# Patient Record
Sex: Female | Born: 1970 | Hispanic: No | Marital: Single | Smoking: Former smoker
Health system: Southern US, Community
[De-identification: ages and names within clinical notes are randomized; demographics above are authoritative.]

## PROBLEM LIST (undated history)

## (undated) ENCOUNTER — Emergency Department (HOSPITAL_BASED_OUTPATIENT_CLINIC_OR_DEPARTMENT_OTHER): Admission: EM | Payer: Medicaid Other

## (undated) DIAGNOSIS — H35039 Hypertensive retinopathy, unspecified eye: Secondary | ICD-10-CM

## (undated) DIAGNOSIS — G932 Benign intracranial hypertension: Secondary | ICD-10-CM

## (undated) DIAGNOSIS — E041 Nontoxic single thyroid nodule: Secondary | ICD-10-CM

## (undated) DIAGNOSIS — I10 Essential (primary) hypertension: Secondary | ICD-10-CM

## (undated) DIAGNOSIS — G4730 Sleep apnea, unspecified: Secondary | ICD-10-CM

## (undated) DIAGNOSIS — E785 Hyperlipidemia, unspecified: Secondary | ICD-10-CM

## (undated) DIAGNOSIS — I2510 Atherosclerotic heart disease of native coronary artery without angina pectoris: Secondary | ICD-10-CM

## (undated) DIAGNOSIS — I639 Cerebral infarction, unspecified: Secondary | ICD-10-CM

## (undated) DIAGNOSIS — F32A Depression, unspecified: Secondary | ICD-10-CM

## (undated) DIAGNOSIS — E11319 Type 2 diabetes mellitus with unspecified diabetic retinopathy without macular edema: Secondary | ICD-10-CM

## (undated) DIAGNOSIS — D649 Anemia, unspecified: Secondary | ICD-10-CM

## (undated) DIAGNOSIS — F419 Anxiety disorder, unspecified: Secondary | ICD-10-CM

## (undated) DIAGNOSIS — R0600 Dyspnea, unspecified: Secondary | ICD-10-CM

## (undated) DIAGNOSIS — J45909 Unspecified asthma, uncomplicated: Secondary | ICD-10-CM

## (undated) DIAGNOSIS — N189 Chronic kidney disease, unspecified: Secondary | ICD-10-CM

## (undated) DIAGNOSIS — R519 Headache, unspecified: Secondary | ICD-10-CM

## (undated) DIAGNOSIS — E559 Vitamin D deficiency, unspecified: Secondary | ICD-10-CM

## (undated) DIAGNOSIS — J189 Pneumonia, unspecified organism: Secondary | ICD-10-CM

## (undated) DIAGNOSIS — M1990 Unspecified osteoarthritis, unspecified site: Secondary | ICD-10-CM

## (undated) DIAGNOSIS — H269 Unspecified cataract: Secondary | ICD-10-CM

## (undated) HISTORY — DX: Type 2 diabetes mellitus with unspecified diabetic retinopathy without macular edema: E11.319

## (undated) HISTORY — DX: Chronic kidney disease, unspecified: N18.9

## (undated) HISTORY — DX: Hypertensive retinopathy, unspecified eye: H35.039

## (undated) HISTORY — DX: Hyperlipidemia, unspecified: E78.5

## (undated) HISTORY — DX: Atherosclerotic heart disease of native coronary artery without angina pectoris: I25.10

## (undated) HISTORY — PX: TUBAL LIGATION: SHX77

## (undated) HISTORY — DX: Cerebral infarction, unspecified: I63.9

## (undated) HISTORY — DX: Unspecified asthma, uncomplicated: J45.909

## (undated) HISTORY — DX: Unspecified cataract: H26.9

## (undated) HISTORY — DX: Vitamin D deficiency, unspecified: E55.9

## (undated) HISTORY — PX: ACHILLES TENDON REPAIR: SUR1153

---

## 2005-06-07 ENCOUNTER — Emergency Department (HOSPITAL_COMMUNITY): Admission: EM | Admit: 2005-06-07 | Discharge: 2005-06-07 | Payer: Self-pay | Admitting: Emergency Medicine

## 2005-08-25 ENCOUNTER — Ambulatory Visit: Payer: Self-pay | Admitting: Family Medicine

## 2005-08-26 ENCOUNTER — Ambulatory Visit: Payer: Self-pay | Admitting: Family Medicine

## 2005-10-13 ENCOUNTER — Encounter: Admission: RE | Admit: 2005-10-13 | Discharge: 2005-10-13 | Payer: Self-pay | Admitting: Neurology

## 2005-10-19 ENCOUNTER — Ambulatory Visit (HOSPITAL_COMMUNITY): Admission: RE | Admit: 2005-10-19 | Discharge: 2005-10-19 | Payer: Self-pay | Admitting: Neurology

## 2005-12-16 ENCOUNTER — Encounter: Admission: RE | Admit: 2005-12-16 | Discharge: 2005-12-16 | Payer: Self-pay | Admitting: Ophthalmology

## 2006-04-10 ENCOUNTER — Emergency Department (HOSPITAL_COMMUNITY): Admission: EM | Admit: 2006-04-10 | Discharge: 2006-04-10 | Payer: Self-pay | Admitting: Emergency Medicine

## 2006-04-14 ENCOUNTER — Encounter: Admission: RE | Admit: 2006-04-14 | Discharge: 2006-04-14 | Payer: Self-pay | Admitting: Family Medicine

## 2006-05-10 ENCOUNTER — Ambulatory Visit (HOSPITAL_COMMUNITY): Admission: RE | Admit: 2006-05-10 | Discharge: 2006-05-10 | Payer: Self-pay | Admitting: Orthopedic Surgery

## 2006-05-25 ENCOUNTER — Ambulatory Visit: Payer: Self-pay | Admitting: Family Medicine

## 2006-08-13 ENCOUNTER — Inpatient Hospital Stay (HOSPITAL_COMMUNITY): Admission: AD | Admit: 2006-08-13 | Discharge: 2006-08-13 | Payer: Self-pay | Admitting: Gynecology

## 2006-09-29 ENCOUNTER — Inpatient Hospital Stay (HOSPITAL_COMMUNITY): Admission: AD | Admit: 2006-09-29 | Discharge: 2006-10-01 | Payer: Self-pay | Admitting: Obstetrics & Gynecology

## 2006-12-14 ENCOUNTER — Inpatient Hospital Stay (HOSPITAL_COMMUNITY): Admission: AD | Admit: 2006-12-14 | Discharge: 2006-12-29 | Payer: Self-pay | Admitting: Obstetrics & Gynecology

## 2006-12-26 ENCOUNTER — Encounter: Payer: Self-pay | Admitting: Obstetrics & Gynecology

## 2007-12-21 ENCOUNTER — Inpatient Hospital Stay (HOSPITAL_COMMUNITY): Admission: AD | Admit: 2007-12-21 | Discharge: 2007-12-23 | Payer: Self-pay | Admitting: Obstetrics & Gynecology

## 2007-12-25 ENCOUNTER — Inpatient Hospital Stay (HOSPITAL_COMMUNITY): Admission: AD | Admit: 2007-12-25 | Discharge: 2007-12-29 | Payer: Self-pay | Admitting: Obstetrics

## 2008-01-11 ENCOUNTER — Inpatient Hospital Stay (HOSPITAL_COMMUNITY): Admission: AD | Admit: 2008-01-11 | Discharge: 2008-01-14 | Payer: Self-pay | Admitting: Obstetrics

## 2008-01-28 ENCOUNTER — Inpatient Hospital Stay (HOSPITAL_COMMUNITY): Admission: AD | Admit: 2008-01-28 | Discharge: 2008-02-01 | Payer: Self-pay | Admitting: Obstetrics & Gynecology

## 2008-01-29 ENCOUNTER — Encounter: Payer: Self-pay | Admitting: Obstetrics & Gynecology

## 2009-10-18 ENCOUNTER — Emergency Department (HOSPITAL_COMMUNITY): Admission: EM | Admit: 2009-10-18 | Discharge: 2009-10-18 | Payer: Self-pay | Admitting: Emergency Medicine

## 2010-01-03 ENCOUNTER — Emergency Department (HOSPITAL_COMMUNITY)
Admission: EM | Admit: 2010-01-03 | Discharge: 2010-01-03 | Payer: Self-pay | Source: Home / Self Care | Admitting: Emergency Medicine

## 2010-08-29 ENCOUNTER — Encounter: Payer: Self-pay | Admitting: Obstetrics & Gynecology

## 2010-09-06 ENCOUNTER — Ambulatory Visit (HOSPITAL_BASED_OUTPATIENT_CLINIC_OR_DEPARTMENT_OTHER)
Admission: RE | Admit: 2010-09-06 | Discharge: 2010-09-06 | Payer: Self-pay | Source: Home / Self Care | Attending: Internal Medicine | Admitting: Internal Medicine

## 2010-09-11 DIAGNOSIS — G4733 Obstructive sleep apnea (adult) (pediatric): Secondary | ICD-10-CM

## 2010-10-30 ENCOUNTER — Emergency Department (HOSPITAL_COMMUNITY)
Admission: EM | Admit: 2010-10-30 | Discharge: 2010-10-30 | Disposition: A | Discharge status: Home / Self Care | Payer: Medicaid Other | Attending: Emergency Medicine | Admitting: Emergency Medicine

## 2010-10-30 DIAGNOSIS — E669 Obesity, unspecified: Secondary | ICD-10-CM | POA: Insufficient documentation

## 2010-10-30 DIAGNOSIS — M79609 Pain in unspecified limb: Secondary | ICD-10-CM | POA: Insufficient documentation

## 2010-10-30 DIAGNOSIS — E119 Type 2 diabetes mellitus without complications: Secondary | ICD-10-CM | POA: Insufficient documentation

## 2010-10-30 DIAGNOSIS — M7989 Other specified soft tissue disorders: Secondary | ICD-10-CM | POA: Insufficient documentation

## 2010-10-30 DIAGNOSIS — L02619 Cutaneous abscess of unspecified foot: Secondary | ICD-10-CM | POA: Insufficient documentation

## 2010-10-30 DIAGNOSIS — J45909 Unspecified asthma, uncomplicated: Secondary | ICD-10-CM | POA: Insufficient documentation

## 2010-10-30 DIAGNOSIS — L03039 Cellulitis of unspecified toe: Secondary | ICD-10-CM | POA: Insufficient documentation

## 2010-10-30 DIAGNOSIS — I10 Essential (primary) hypertension: Secondary | ICD-10-CM | POA: Insufficient documentation

## 2010-10-30 LAB — POCT I-STAT, CHEM 8
BUN: 4 mg/dL — ABNORMAL LOW (ref 6–23)
Calcium, Ion: 1.16 mmol/L (ref 1.12–1.32)
Chloride: 98 mEq/L (ref 96–112)
Creatinine, Ser: 0.6 mg/dL (ref 0.4–1.2)
Glucose, Bld: 286 mg/dL — ABNORMAL HIGH (ref 70–99)
HCT: 43 % (ref 36.0–46.0)
Hemoglobin: 14.6 g/dL (ref 12.0–15.0)
Potassium: 3.4 mEq/L — ABNORMAL LOW (ref 3.5–5.1)
Sodium: 137 mEq/L (ref 135–145)
TCO2: 28 mmol/L (ref 0–100)

## 2010-12-21 NOTE — H&P (Signed)
[name], [name]               ACCOUNT NO.:  [id number]   MEDICAL RECORD NO.:  [id number]          PATIENT TYPE:  MAT   LOCATION:  [facility]                          FACILITY:  [facility]   PHYSICIAN:  Agnes Lawrence, M.D.DATE OF BIRTH:  1971-04-12   DATE OF ADMISSION:  01/28/2008  DATE OF DISCHARGE:                              HISTORY & PHYSICAL   CHIEF COMPLAINT:  The patient is a 40 year old para 3 with an estimated  date of confinement of August 9 with an intrauterine pregnancy at 33+  weeks with gestational diabetes, chronic hypertension, now with  superimposed severe preeclampsia for a repeat cesarean delivery,  possible bilateral tubal ligation.   HISTORY OF PRESENT ILLNESS:  The patient presented to the office today  for routine prenatal care.  However, her blood pressures were in the 170-  180s over 100-126s.  As per conversation with Dr. Burnett Harry, her  antihypertensive regimen was recently titrated last week.  She reports  being compliant with her antihypertensive medications.  She reports  lower extremity swelling.  She denies any neurological symptoms.  Her  CBCs are essentially in range.   MEDICATIONS:  Please see the medication reconciliation form.   ALLERGIES:  No known drug allergies.   OBSTETRICAL RISK FACTORS:  Advanced maternal age, previous cesarean  delivery, previous preterm delivery, history of a previous fetal demise,  chronic hypertension, asthma, diabetes mellitus pseudotumor cerebri, and  obstructive sleep apnea.   PAST OBSTETRICAL HISTORY:  In 1994, there was a second trimester fetal  demise.  In January 1996 she was delivered of a live born female 8  pounds 12 ounces full term via vaginal delivery.  In December 1997,  there was a 27-week fetal demise.  In January 2007, there was a  spontaneous.  Abortion in April 2002 there was a preterm delivery at 83  weeks.  She was delivered of a 6 pounds 12 ounces female via vaginal  delivery.  In May of 2008,  she delivered a live born female 9 pounds 4  ounces via cesarean delivery.  That pregnancy was complicated by  preeclampsia.   PRENATAL LABS:  Blood type is AB positive, antibody screen negative.  Urine culture and sensitivity in February 2009 no growth.  GC probe  negative.  Hepatitis B surface antigen negative, hematocrit 39.6,  hemoglobin 13, HIV nonreactive, hemoglobin A1c in February 2009 8.1,  platelets 190,000.  In March 2009 a 24-hour urine demonstrated 158 mg  per day.  RPR nonreactive, rubella immune.  Sickle cell negative.  Varicella immune.  Chlamydia probe negative.  An ultrasound on May 28 at  30 weeks 2 days.  The amniotic fluid was mildly elevated at 22.7.  The  estimated fetal weight percentile was at 86th percentile.   PAST GYN HISTORY:  Noncontributory.   PAST MEDICAL HISTORY:  Please see the above.   PAST SURGICAL HISTORY:  Please see the above.  There is a history of two  right foot surgeries.   SOCIAL HISTORY:  She is unemployed.  She is single, living with a  significant other.  She does not give  any significant history of alcohol  usage.  She previously smoked less than one-half pack per day and  stopped recently.  She denies illicit drug use.   FAMILY HISTORY:  Asthma, hypertension, heart disease, myocardial  infarction, hypercholesterolemia, adult-onset diabetes, depression and  CVA.   PHYSICAL EXAM:  VITAL SIGNS:  Blood pressure 170-180s over 100-120s.  Fetal nonstress test, reactive.  Tocodynamometer, irregular uterine  contractions.   REVIEW OF SYSTEMS:  Neurological: She denies headache, visual  disturbances.  GI: She denies any epigastric pain, nausea or vomiting.  Cardiac: Please see the above.   ASSESSMENT:  Intrauterine pregnancy at 33+ weeks with chronic  hypertension, likely superimposed severe preeclampsia, also in the  setting of pregestational diabetes, multiple comorbidities.   PLAN:  Admission, a repeat cesarean delivery and possible  BTL are  planned.  Will check PIH panel, urinalysis.  Will also start magnesium  sulfate for seizure prophylaxis.      Agnes Lawrence, M.D.  Electronically Signed     LAJ/MEDQ  D:  01/28/2008  T:  01/28/2008  Job:  [id number]

## 2010-12-21 NOTE — H&P (Signed)
NAMELAURENA, [name]               ACCOUNT NO.:  [id number]   MEDICAL RECORD NO.:  [id number]          PATIENT TYPE:  INP   LOCATION:  9120                          FACILITY:  [facility]   PHYSICIAN:  Agnes Lawrence, M.D.DATE OF BIRTH:  Jul 16, 1971   DATE OF ADMISSION:  01/11/2008  DATE OF DISCHARGE:                              HISTORY & PHYSICAL   CHIEF COMPLAINT:  The patient is a 40 year old para 3 with an estimated  date of confinement of March 16, 2008 with an intrauterine pregnancy at  35 plus weeks with gestational diabetes and a history of chronic  hypertension, now with elevated blood pressures.   HISTORY OF PRESENT ILLNESS:  The patient presented to the office today  for routine prenatal care.  However, her blood pressures were in the  170s-180s/100s-110.  She reports being compliant with her  antihypertensive medications.  She denies any neurological symptoms.  She has also not been checking her CBGs at home for the last several  days.   MEDICATIONS:  Please see the medication reconciliation form.   ALLERGIES:  No known drug allergies.   OBSTETRICAL RISK FACTORS:  Advanced maternal age, previous cesarean  delivery, previous preterm delivery, history of a previous fetal demise,  chronic hypertension, asthma, diabetes mellitus, pseudotumor cerebri,  and obstructive sleep apnea.   PAST OBSTETRICAL HISTORY:  In 1994, there was a second trimester fetal  demise.  In January 1996, she was delivered of a live born female 8  pounds 12 ounces full term vaginal delivery.  In December 1997, there  was a 27-week fetal demise.  In January 2007, there was a spontaneous  abortion.  In April 2002, there was a preterm delivery at 37 weeks.  She  delivered of a 6 pounds 12 ounces female via vaginal delivery.  In May  2008, she delivered of a live born female 9 pounds 4 ounces via cesarean  delivery.   PRENATAL LABS:  Blood type is AB positive, antibody screen negative,  urine culture  and sensitivity in February 2009 no growth, GC probe  negative, hepatitis B surface antigen negative, hematocrit 39.6,  hemoglobin 13, HIV nonreactive, hemoglobin A1c in February 2009 was 8.1,  platelets 190,000 in March 2009, a 24-hour urine demonstrated 158  mg/day, RPR nonreactive, rubella immune, sickle cell negative, varicella  immune, chlamydia probe negative.   PAST GYN HISTORY:  Noncontributory.   PAST MEDICAL HISTORY:  Please see the above.   PAST SURGICAL HISTORY:  Please see the above.  There is a history of two  right foot surgeries.   SOCIAL HISTORY:  She is unemployed.  She is single, living with a  significant other.  She does not give any significant history of alcohol  usage.  She previously smoked less than one-half pack per day and  stopped recently.  She denies illicit drug use.   FAMILY HISTORY:  Asthma, hypertension, heart disease, myocardial  infarction, hypercholesterolemia, adult-onset diabetes, depression, and  CVA.   PHYSICAL EXAMINATION:  VITAL SIGNS:  Blood pressures 170s-180s/100-110s,  CBG 78 random.  Nonstress test reassuring 10/10 beat accelerations.  TOCODYNAMOMETER:  Irregular uterine contractions.  STERILE VAGINAL EXAM:  The cervix is closed posterior long.  There is  old heme noted on the exam glove.   REVIEW OF SYSTEMS:  GU:  She reports mucous like discharge with blood.  She denies recent coitus.  NEUROLOGICAL:  Please see the above.  She  denies headache, visual disturbances.  GI:  She denies any epigastric  pain, nausea, or vomiting.   ASSESSMENT:  Intrauterine pregnancy at 31 plus weeks with pregestational  diabetes, chronic hypertension rule out superimposed pregnancy-induced  hypertension.   PLAN:  Admission, bedrest, PIH labs, 24-hour urine for protein and  creatinine, consider possible steroids for fetal lung maturity, and  daily weights.      Agnes Lawrence, M.D.  Electronically Signed     LAJ/MEDQ  D:   01/11/2008  T:  01/12/2008  Job:  [id number]

## 2010-12-21 NOTE — H&P (Signed)
[name], [name]               ACCOUNT NO.:  [id number]   MEDICAL RECORD NO.:  [id number]          PATIENT TYPE:  INP   LOCATION:  [id number]                          FACILITY:  [facility]   PHYSICIAN:  Agnes Lawrence, M.D.DATE OF BIRTH:  June 25, 1971   DATE OF ADMISSION:  12/25/2007  DATE OF DISCHARGE:                              HISTORY & PHYSICAL   CHIEF COMPLAINT:  The patient is a 40 year old para 3 with an estimated  date of confinement of March 16, 2008, with an intrauterine pregnancy at  28 plus weeks with gestational diabetes and hyperglycemia.   HISTORY OF PRESENT ILLNESS:  The patient was recently admitted several  days ago with similar issues.  She per her report had CBGs ranging in  200-400 range at home.   MEDICATIONS:  Please see the medication reconciliation form.   ALLERGIES:  No known drug allergies.   OBSTETRIC RISK FACTORS:  1. Advanced maternal age.  2. Previous cesarean delivery.  3. Previous preterm delivery.  4. History of a previous fetal demise.  5. Chronic hypertension.  6. Asthma.  7. Diabetes mellitus.  8. Pseudotumor cerebri.  9. Obstructive sleep apnea.   PAST OBSTETRICAL HISTORY:  1. In 1994, there was second trimester fetal demise.  2. In August 26, 1994, she was delivered of a live born female, 8      pounds 12 ounces full term vaginal delivery.  3. In December 1997, there was a 27-week fetal demise.  4. In January 2001, there was a spontaneous abortion.  5. In April 2002, there was a preterm delivery at 44 weeks.  She was      delivered of a 6 pounds 12 ounces female vaginal delivery.  6. In May 2008, she was delivered of a live born female 9 pounds 4      ounces via cesarean delivery.   PRENATAL LABORATORY:  Blood type is AB positive, antibody screen  negative.  Urine culture and sensitivity in February 2009, no growth.  GC probe negative.  Hepatitis B surface antigen negative.  Hematocrit  39.6, hemoglobin 13, HIV nonreactive,  hemoglobin A1c in on October 04, 2007, was 8.1, and platelet count 190,000.  In March 2009,  a 24-hour  urine was remarkable for 158 mg per day.  RPR nonreactive and rubella  immune.  Sickle cell negative.  Varicella immune.   PAST GYN HISTORY:  Noncontributory.   PAST MEDICAL HISTORY:  Please see the above.   PAST SURGICAL HISTORY:  Please see the above.  There is a history of two  right foot surgeries.   SOCIAL HISTORY:  She is unemployed.  She is single, living with the  significant other.  She does not give any significant history of alcohol  usage.  Previously smoked less than on-half pack per day, stopped  recently.  Denies illicit drug use.   FAMILY HISTORY:  Asthma, hypertension, heart disease, myocardial  infarction, hypercholesterolemia, adult-onset diabetes, depression, and  CVA.   On physical exam,  vital signs, CBGs 180s-250s, blood pressures 140s-  150s over 90s-100.  Fetal heart tracing reassuring.  Tocodynamometer  irregular uterine contractions.  Sterile vaginal exam per the RN.   ASSESSMENT:  Intrauterine pregnancy at 28 plus weeks with pregestational  diabetes mellitus, poorly controlled.   PLAN:  Admission, diabetic management consultation to further titrate  her insulin regimen.      Agnes Lawrence, M.D.  Electronically Signed     LAJ/MEDQ  D:  12/26/2007  T:  12/26/2007  Job:  [id number]

## 2010-12-21 NOTE — Discharge Summary (Signed)
[name], [name]               ACCOUNT NO.:  [id number]   MEDICAL RECORD NO.:  [id number]          PATIENT TYPE:  INP   LOCATION:  9304                          FACILITY:  [facility]   PHYSICIAN:  Charles A. Jodi Mourning, M.D.DATE OF BIRTH:  October 25, 1970   DATE OF ADMISSION:  01/28/2008  DATE OF DISCHARGE:  02/01/2008                               DISCHARGE SUMMARY   ADMITTING DIAGNOSES:  1. 34 weeks' gestation.  2. Chronic hypertension with superimposed severe preeclampsia.  3. Pregestational diabetes.  4. Desired permanent sterilization.   DISCHARGE DIAGNOSES:  1. 34 weeks' gestation.  2. Chronic hypertension with superimposed severe preeclampsia.  3. Pregestational diabetes.  4. Desired permanent sterilization.  5. Status post repeat low transverse cesarean section and bilateral      partial salpingectomy on January 29, 2008.  Viable female infant was      delivered at 11:27.  Apgars of 7 at 1 minute, 8 at 5 minutes,      weight of 2965 g.  Mother discharged home on postop day #3 in good      condition.  The infant was taken to the neonatal intensive care      unit and remains there for prematurity.   REASON FOR ADMISSION:  The patient is 36-year para 3 with estimated date  of confinement of March 16, 2008, presents at approximately 40 weeks'  gestation with chronic hypertension and now with superimposed severe  preeclampsia for repeat cesarean delivery.  The patient also desired  permanent sterilization.  She presented to the office today for routine  prenatal care, but her blood pressures were 171-80 over 100-120.  As per  conversation with Dr. Renella Cunas at [facility], the  patient's antihypertensive regimen was recently titrated last week.  She  reports being compliant with her antihypertensive regimen.  She reports  lower extremity swelling.  Denied any neurological symptoms.   PAST OBSTETRICAL HISTORY:  In 1994, there was a second trimester fetal  demise; and 1996,  she delivered a live born female 8 pounds 12 ounces,  full-term by vaginal delivery; in 1997, there was a 60 week fetal  demise; in 2007, there was spontaneous abortion; in 2002, there was  delivery at [redacted] weeks gestation, 6 pounds and 12 ounces via vaginal  delivery; in 2008, she delivered a live born female via cesarean  delivery, weighing 9 pounds 4 ounces, that pregnancy was complicated by  preeclampsia, obstetrical risk factors, advanced maternal age, previous  cesarean section, previous fetal demise, chronic hypertension, diabetes  mellitus, pseudotumor cerebri, asthma, and obstructive sleep apnea.   PAST MEDICAL HISTORY:  Surgery per history of present illness.  Illnesses include diabetes, asthma, pseudotumor cerebri, and  hypertension.   MEDICATIONS:  Please see medication reconsolidation form.   ALLERGIES:  No known drug allergies.   SOCIAL HISTORY:  She is single, unemployed, living with her significant  other.  Negative history of alcohol.  Positive history of tobacco.  Negative history of recreational drug use.   REVIEW OF SYSTEMS:  Neurologic:  She denied headache or visual  disturbances.  GI:  She  denied any epigastric pain, nausea, or vomiting.   PHYSICAL EXAMINATION:  GENERAL:  Obese, black female, in no acute  distress.  VITAL SIGNS:  Blood pressure 170-180 over 100-120, afebrile.  External fetal monitoring revealed reactive tracing.  Tocodynamometer revealed irregular uterine contractions.  LUNGS:  Clear to auscultation bilaterally.  HEART:  Regular rate and rhythm.  ABDOMEN:  Obese, gravid, and nontender.  CERVIX:  Long, closed, and vertex at high station.   LABORATORY DATA:  Admitting labs, hemoglobin 13.9, hematocrit 40.5,  white blood cell count 9200, and platelets 225,000.  Comprehensive  metabolic panel was significant for sodium 134, potassium 3.2, glucose  of 63, BUN 6, creatinine 0. 5.  AST was 30 and ALT was 29.  Uric acid  was 6.9.  Urinalysis  revealed large hemoglobin, specific gravity 1.020,  greater than 300 protein, trace leukocyte esterase, and many epithelial  cells.  RPR is nonreactive.   IMPRESSION:  1. A 34 weeks' gestation.  2. Chronic hypertension.  3. Diabetes  4. Asthma.  5. Superimposed severe preeclampsia.  6. Desired repeat cesarean section and bilateral tubal ligation.  7. Gestational age of approximately 79 weeks'.   PLAN:  Admit, we will proceed with repeat low transverse cesarean  section and bilateral tubal ligation.   HOSPITAL COURSE:  The patient underwent repeat low transverse cesarean  section and bilateral partial salpingectomy on January 29, 2008.  There  were no intraoperative complications.  Postoperative course was  uncomplicated.  The patient was discharged home on postop day #3 in good  condition.   Discharge labs, hemoglobin 10, hematocrit 29, white blood cell count  8200, and platelets 179,000.   DISCHARGE DISPOSITION:  Medications, Tylox and ibuprofen was prescribed  for pain.  Continue prenatal vitamins.  Continue medications taken at  home prior to admission to the hospital.  Routine written instructions  were given for discharge after cesarean section.  The patient is to call  the office for a followup appointment in 2 weeks.      Charles A. Jodi Mourning, M.D.  Electronically Signed     CAH/MEDQ  D:  02/01/2008  T:  02/02/2008  Job:  [id number]

## 2010-12-21 NOTE — Discharge Summary (Signed)
[name], [name]               ACCOUNT NO.:  [id number]   MEDICAL RECORD NO.:  [id number]          PATIENT TYPE:  INP   LOCATION:  9151                          FACILITY:  [facility]   PHYSICIAN:  Agnes Lawrence, M.D.DATE OF BIRTH:  [birth year]   DATE OF ADMISSION:  12/21/2007  DATE OF DISCHARGE:  12/23/2007                               DISCHARGE SUMMARY   CHIEF COMPLAINT:  The patient is a 40 year old para 3 with an estimated  date of confinement of August 9 with an intrauterine pregnancy at 28  plus weeks with gestational diabetes and acute hyperglycemia.  Please  see the dictated history and physical for further details.   HOSPITAL COURSE:  The patient was admitted.  The diabetes coordinator  was consulted.  Her insulin regimen was titrated to optimize her  glycemic control.  Her CBGs were in the euglycemic range prior to  discharge to home.   DISCHARGE DIAGNOSIS:  Intrauterine pregnancy at 28 plus weeks with  gestational diabetes and acute hyperglycemia.   PROCEDURES:  None.   CONDITION:  Stable.   DIET:  ADA diet.   MEDICATIONS:  Continue Lantus and Humalog insulin.   DISPOSITION:  The patient is to follow up in the office in several days.      Agnes Lawrence, M.D.  Electronically Signed     LAJ/MEDQ  D:  01/24/2008  T:  01/25/2008  Job:  [id number]

## 2010-12-21 NOTE — H&P (Signed)
NAMEKASHEA, BARTONE               ACCOUNT NO.:  [id number]   MEDICAL RECORD NO.:  [id number]          PATIENT TYPE:  INP   LOCATION:  9151                          FACILITY:  [facility]   PHYSICIAN:  Agnes Lawrence, M.D.DATE OF BIRTH:  [birth year]   DATE OF ADMISSION:  12/21/2007  DATE OF DISCHARGE:                              HISTORY & PHYSICAL   CHIEF COMPLAINT:  The patient is a 40 year old para 3 with an estimated  date of confinement of August 9 with an intrauterine pregnancy at 28.3  weeks with gestational diabetes and acute hyperglycemia.   HISTORY OF PRESENT ILLNESS:  The patient reported today for a routine  prenatal visit.  Upon review of her diabetic record her CBGs for the  past 24 hours have all been elevated, in the 200-400 range.  A CBG here  in the office was 295.  On urine dip there was 4+ glycosuria, no  ketones.  The fetal heart tones by Doppler were normal, in the 140  range.  She denies any symptoms consistent with an intercurrent illness.   MEDICATIONS:  Please see the medication reconciliation form.   ALLERGIES:  No known drug allergies.   OB RISK FACTORS:  Advanced maternal age, previous cesarean delivery,  previous preterm delivery, history of previous fetal demise, chronic  hypertension, asthma, diabetes mellitus, obstructive sleep apnea and  pseudotumor cerebri.   PAST OBSTETRICAL HISTORY:  In 1994 there was a second-trimester fetal  demise.  In January 1996 she was delivered of a live-born female, 8  pounds 12 ounces, full term vaginal delivery.  In December 1997 there  was a 27-week fetal demise.  In January 2001 there was a spontaneous  abortion.  In April 2002 there was a preterm delivery at 32 weeks, 6  pounds 12 ounces, female, vaginal delivery.  In May 2008 she was  delivered of a live-born female, 9 pounds 4 ounces, via cesarean  delivery.   PRENATAL LABORATORIES:  Blood type is AB positive, antibody screen  negative.  Urine culture February  2009:  No growth.  GC probe negative.  Hepatitis B surface antigen negative.  Hematocrit 39.6, hemoglobin 13.  HIV nonreactive.  Hemoglobin A1c on October 04, 2007, was 8.1.  Platelet count 190,000.  In March 2009, a 24-hour urine 158 mg per day.  RPR nonreactive, rubella immune.  Sickle cell negative.  Varicella  immune.   PAST GYN HISTORY:  Noncontributory.   PAST MEDICAL HISTORY:  Please see the above.   PAST SURGICAL HISTORY:  Please see the above, right foot surgery x2.   SOCIAL HISTORY:  She is unemployed.  She is single, living with a  significant other.  Does not give any significant history of alcohol  usage.  Previously smoked less than one-half pack per day, stopped  recently.  Denies illicit drug use.   FAMILY HISTORY:  Asthma, hypertension, heart disease, myocardial  infarction, hypercholesterolemia, adult-onset diabetes, depression, CVA.   PHYSICAL EXAMINATION:  VITAL SIGNS:  Stable, afebrile.  ABDOMEN:  Gravid.  PELVIC EXAM:  The cervix is posterior and closed.   ASSESSMENT:  Intrauterine  pregnancy at 28-plus weeks with history of  adult-onset diabetes mellitus, acute hyperglycemia; also with a history  of chronic hypertension, pseudotumor cerebri and asthma.   PLAN:  Admission.  Continue current insulin regimen for now.  Will add a  sliding scale.  Diabetic management consultation.  IV hydration.  Will  check a CBC, CMET and urinanalysis and culture and sensitivity.  NSTs  twice daily.      Agnes Lawrence, M.D.  Electronically Signed     LAJ/MEDQ  D:  12/21/2007  T:  12/21/2007  Job:  [id number]

## 2010-12-21 NOTE — Discharge Summary (Signed)
[name], PATIENT               ACCOUNT NO.:  [id number]   MEDICAL RECORD NO.:  [id number]          PATIENT TYPE:  INP   LOCATION:  9143                          FACILITY:  [facility]   PHYSICIAN:  Charles A. Jodi Mourning, M.D.DATE OF BIRTH:  Aug 26, 1970   DATE OF ADMISSION:  12/14/2006  DATE OF DISCHARGE:  12/29/2006                               DISCHARGE SUMMARY   ADMITTING DIAGNOSES:  1. Thirty-four weeks gestation.  2. Preterm uterine contractions.  3. Poor obstetrical history.  4. Gestational diabetes on insulin.  5. Chronic hypertension.  6. Obstructive sleep apnea.  7. Pseudotumor cerebri.  8. Rule out superimposed preeclampsia.  9. Breech presentation.   DISCHARGE DIAGNOSES:  1. Thirty-four weeks gestation.  2. Preterm uterine contractions.  3. Poor obstetrical history.  4. Gestational diabetes on insulin.  5. Chronic hypertension.  6. Obstructive sleep apnea.  7. Pseudotumor cerebri.  8. Rule out superimposed preeclampsia.  9. Breech presentation.  10.Status post amniocentesis for fetal lung maturity with positive      fetal lung maturity studies.  11.Status post primary low transverse cesarean section on Dec 26, 2006, with delivery of a viable female infant at 82.  Apgars of 8      at one minute, 9 at five minutes.  Weight of 4200 g, length of 53.5      cm.  Mother and infant discharged home in good condition.   REASON FOR ADMISSION:  A 40 year old black female G6, P2 with estimated  date of confinement of January 21, 2007, presented with complaint of  uterine contractions.   PAST OBSTETRICAL HISTORY:  Significant for previous fetal demise.   PAST MEDICAL HISTORY:  Significant for chronic hypertension, asthma.  The patient also had gestational diabetes with the previous pregnancies  on insulin.  She is obese with obstructive sleep apnea.   PAST SURGICAL HISTORY:  Foot surgery.   MEDICATIONS:  Insulin for gestational diabetes, verapamil for  hypertension, Diamox  Sequels, and Topamax.   ALLERGIES:  No known drug allergies.   SOCIAL HISTORY:  Unemployed, single.  Negative tobacco, alcohol or  recreational drug use.   FAMILY HISTORY:  Positive for asthma, hypertension, heart disease,  diabetes, depression, cardiovascular disease.   REVIEW OF SYSTEMS:  Significant for no headaches or blurred vision or  any other visual disturbances.   PHYSICAL EXAMINATION:  GENERAL:  Obese black female in no acute  distress.  VITAL SIGNS:  Afebrile, blood pressure 161/83.  LUNGS:  Clear to auscultation bilaterally.  HEART:  Regular rate and rhythm.  ABDOMEN:  Obese, gravid, nontender.  PELVIC:  Cervix 1 cm dilated, 50% effaced, breech presentation at a -3  station.   IMPRESSION:  Intrauterine pregnancy at [redacted] weeks gestation with premature  uterine contractions.  High risk pregnancy.   PLAN:  Admit for bedrest and diabetic management.  Serial examinations  and labs for rule out preeclampsia.  Bedrest.   ADMITTING LABORATORY VALUES:  Hemoglobin 12.8; hematocrit 37; white  blood cell count 5800; platelets 181,000.  Comprehensive metabolic panel  was within normal limits.  Urinalysis was within  normal limits.   HOSPITAL COURSE:  The patient was admitted and placed on bedrest.  Blood  pressures were stable on antihypertensive medications.  She continued to  have stable blood pressure management and there was no significant  evidence of preeclampsia.  Glucose control was not well controlled  initially, but over the course of her hospital stay glucose control was  maintained.  She was started on magnesium sulfate for preterm uterine  contractions and the magnesium sulfate was discontinued after about a  week of hospitalization and she had no further significant uterine  activity.  At [redacted] weeks gestation a decision was made to proceed with  amniocentesis for assessment of fetal lung maturity for delivery because  of diabetes and chronic hypertension, with the  recommendation by  maternal-fetal consultation that delivery should be attempted as soon as  fetal lung maturity was documented starting at [redacted] weeks gestation with  amniocentesis.  Amniocentesis was done at [redacted] weeks gestation with  positive fetal lung maturity studies and a decision was made to proceed  with cesarean section delivery because of the breech presentation.  Cesarean section was performed as a primary low transverse cesarean  section on Dec 26, 2006.  There were no intraoperative complications.  Postoperative course was uncomplicated.  The patient was discharged home  on postoperative day #3 in good condition.   DISCHARGE LABORATORY VALUES:  Hemoglobin 11.8; hematocrit 34.4; white  blood cell count 8500; platelets 175,000.   DISCHARGE DISPOSITION:  Continue medications that were taken prior to  admission to the hospital.  Insulin was discontinued.  Tylox and  ibuprofen were prescribed for pain.  Routine written instructions were  given for discharge after cesarean section.  The patient is to call the  office for a followup appointment in 2 weeks.     Charles A. Jodi Mourning, M.D.  Electronically Signed    CAH/MEDQ  D:  12/29/2006  T:  12/29/2006  Job:  [id number]

## 2010-12-21 NOTE — Op Note (Signed)
[name], [name]               ACCOUNT NO.:  [id number]   MEDICAL RECORD NO.:  [id number]          PATIENT TYPE:  INP   LOCATION:  [id number]                          FACILITY:  [facility]   PHYSICIAN:  Agnes Lawrence, M.D.DATE OF BIRTH:  March 05, 1971   DATE OF PROCEDURE:  01/29/2008  DATE OF DISCHARGE:                               OPERATIVE REPORT   PREOPERATIVE DIAGNOSES:  1. Intrauterine pregnancy at 34 weeks.  2. Severe preeclampsia.  3. Gestational diabetes.  4. Chronic hypertension.  5. Desires sterilization procedure.   POSTOPERATIVE DIAGNOSES:  1. Intrauterine pregnancy at 34 weeks.  2. Severe preeclampsia.  3. Gestational diabetes.  4. Chronic hypertension.  5. Desires sterilization procedure.   PROCEDURES:  1. Repeat cesarean delivery.  2. Bilateral tubal ligation.   SURGEON:  Agnes Lawrence, MD   ANESTHESIA:  Spinal.   PATHOLOGY:  Placenta.   ESTIMATED BLOOD LOSS:  600 mL.   COMPLICATIONS:  None.   PROCEDURE:  The patient was taken to the operating room with an IV  running.  After a spinal anesthetic was administered, she was placed in  the dorsal supine position and prepped and draped in the usual sterile  fashion.  .  After time-out had been completed, the previous scar was  then incised with a scalpel and carried down to the fascia.  The fascia  was nicked in the midline.  The fascial incision was then extended  bilaterally.  The superior aspect of the fascial incision was tented up  and the underlying rectus muscles dissected off.  The inferior aspect of  the fascial incision was manipulated in a similar fashion.  The rectus  muscles were separated in the midline.  The parietal peritoneum was  tented up and entered sharply.  This incision was then extended  superiorly and inferiorly with good visualization of bladder.  The  Alexis retractor was then placed into the incision.  The vesicouterine  peritoneum was tented up and entered sharply.  This  incision was then  extended bilaterally and the bladder flap was created using both sharp  and blunt dissection.  The lower uterine segment was then incised in a  transverse fashion with a scalpel.  This incision was then extended  bluntly.  With vacuum assistance, the infant's head was delivered  atraumatically.  The oropharynx was suctioned with a bulb suction.  The  cord was clamped and cut.  The infant was handed off to the awaiting  neonatologist.  The placenta was then removed.  The intrauterine cavity  was evacuated of any remaining amniotic fluid, clots, and debris with  moist laparotomy sponge.  The uterine incision was then reapproximated  in a running interlocking fashion using suture of 0 Monocryl.  Adequate  hemostasis was noted.  The left fallopian tube was then identified.  The  mid isthmic portion of tube was then grasped with a Babcock clamp.  A 2-  cm segment of tube was then doubly ligated with 0 plain and excised.  Adequate hemostasis was noted.  The right fallopian tube was manipulated  in a similar fashion.  The retractor was then removed from the incision.  The parietal peritoneum was then reapproximated in a running fashion  using 2-0 Vicryl.  The fascia was closed in a running fashion using 0  PDS.  The skin was closed in a subcuticular fashion using 3-0 Monocryl.  At the closure of the procedure the instrument and pack counts were said  to be correct x2.  Two grams of cephazolin had been given at the  beginning of the procedure.  The patient was taken to the PACU awake and  in stable condition.      Agnes Lawrence, M.D.  Electronically Signed     LAJ/MEDQ  D:  01/29/2008  T:  01/30/2008  Job:  [id number]

## 2010-12-21 NOTE — H&P (Signed)
[name], [name]               ACCOUNT NO.:  [id number]   MEDICAL RECORD NO.:  [id number]          PATIENT TYPE:  INP   LOCATION:  [id number]                          FACILITY:  [facility]   PHYSICIAN:  Agnes Lawrence, M.D.DATE OF BIRTH:  [birth year]   DATE OF ADMISSION:  12/14/2006  DATE OF DISCHARGE:                              HISTORY & PHYSICAL   CHIEF COMPLAINT:  The patient is a 40 year old gravida 57, para 2 with an  estimated date of confinement of June 15th with an intrauterine  pregnancy at 34+ weeks, complaining of uterine contractions.   HISTORY OF PRESENT ILLNESS:  Please see the above.   ALLERGIES:  No known drug allergies.   MEDICATIONS:  Please the medication reconciliation form.   OBSTETRICAL RISK FACTORS:  She has a history of fetal demise, chronic  hypertension, asthma, gestational diabetes, on insulin, obesity,  obstructive sleep apnea, prenatal labs, anticardiolipin IgG normal,  anticardiolipin antibody IgM negative, Chlamydia probe negative.  Urine  culture and sensitivity, no uropathogens.  Pap smear negative.  GC probe  negative.  Hematocrit 39.1, hemoglobin 13.2, HIV nonreactive, lupus  anticoagulant not detected.  Platelets 248,000.  A 24-hour urine for  protein on November 21 3 mg per day.  Blood type AB+.  Antibody screen  negative.  RPR nonreactive.  Rubella immune.  Sickle cell negative.  Varicella immune.   An ultrasound on February 25th at 24 weeks, there was no previa.  The  amniotic fluid was normal.  The estimated fetal weight percentile was  66%.   PAST GYN HISTORY:  Please see the above.  She has a history of  pseudotumor cerebri.   PAST SURGICAL HISTORY:  Foot surgery.   SOCIAL HISTORY:  She is unemployed, single.  Does not give any  significant history of alcohol usage.  She has no significant smoking  history.  Denies illicit drug use.   FAMILY HISTORY:  Asthma, hypertension, heart disease, myocardial  infarction,  hypercholesterolemia, adult-onset diabetes, depression, and  CVA.   REVIEW OF SYSTEMS:  She denies headache, blurred vision, or any other  visual disturbances.   PAST OBSTETRICAL HISTORY:  In April, 2002, she delivered a 6 pound, 12  ounce female, questionable gestational age, vaginal delivery.  In  January, 2001, she had a spontaneous abortion.  In December, 1997, she  had a fetal demise at 83 weeks.  In January, 1996, she delivered a  female, 8 pounds, 12 ounces, full term vaginal delivery.  In January,  1994, she had a second trimester fetal demise.   PHYSICAL EXAMINATION:  VITAL SIGNS:  Blood pressure 161/83, urine dipped  negative for protein, weight 273 pounds.  Nonstress test reassuring with  regular contractions every 5-10 minutes.  PELVIC:  Sterile vaginal exam of the cervix is fingertip to 1, 50%  effaced.   ASSESSMENT:  Intrauterine pregnancy at 34+ weeks with premature  contractions, poor obstetrical history, gestational diabetes on insulin,  chronic hypertension, obstructive sleep apnea, pseudotumor cerebri, rule  out superimposed pregnancy-induced hypertension.   PLAN:  Admission, bedrest, glycemic surveillance, ADA diet, a 24-hour  urine for protein  and creatinine, serial PIH labs, a complete OB  ultrasound for growth, bedrest.  Will obtain a CPAP machine, continue  antihypertensive medications.      Agnes Lawrence, M.D.  Electronically Signed     LAJ/MEDQ  D:  12/14/2006  T:  12/14/2006  Job:  [id number]

## 2010-12-21 NOTE — Op Note (Signed)
[name], [name]               ACCOUNT NO.:  [id number]   MEDICAL RECORD NO.:  [id number]          PATIENT TYPE:  OUT   LOCATION:  ULT                           FACILITY:  [facility]   PHYSICIAN:  Agnes Lawrence, M.D.DATE OF BIRTH:  24-Mar-1971   DATE OF PROCEDURE:  12/26/2006  DATE OF DISCHARGE:                               OPERATIVE REPORT   PREOPERATIVE DIAGNOSES:  1. Intrauterine pregnancy at 36 weeks.  2. Breech presentation.  3. Gestational diabetes on insulin.  4. Chronic hypertension with superimposed mild preeclampsia.  5. Rule out macrosomia.   POSTOPERATIVE DIAGNOSES:  1. Intrauterine pregnancy at 36 weeks.  2. Breech presentation.  3. Gestational diabetes on insulin.  4. Chronic hypertension with superimposed mild preeclampsia.  5. Rule out macrosomia.   PROCEDURE:  Primary low uterine flap elliptical cesarean delivery via  Pfannenstiel skin incision.   SURGEONS:  1. Agnes Lawrence, M.D.  2. Charles A. Jodi Mourning, M.D.   ANESTHESIA:  Spinal.   ESTIMATED BLOOD LOSS:  600 mL.   COMPLICATIONS:  None.   PROCEDURE:  The patient was taken to the operating room with an IV  running and a Foley catheter in place.  She was placed in the dorsal  supine position with a leftward tilt and prepped and draped in the usual  sterile fashion.  A Pfannenstiel skin incision was then made with the  scalpel and carried down to the underlying fascia with the Bovie.  The  fascia was nicked in the midline.  The fascial incision was then  extended bilaterally with curved Mayo scissors.  The superior aspect of  the fascial incision was then tented up and the underlying rectus  muscles dissected off.  The inferior aspect of the fascial incision was  manipulated in a similar fashion.  The rectus muscles were separated in  the midline.  The parietal peritoneum was then entered.  The peritoneal  incision was then extended superiorly and inferiorly with good  visualization of the  bladder.  The Alexis retractor was then placed into  the incision.  The vesicouterine peritoneum was tented up and entered  sharply with Metzenbaum scissors.  This incision was then extended  bilaterally and the bladder flap created bluntly.  The lower uterine  segment was then incised in a transverse fashion with a scalpel.  This  incision was then extended bluntly.  A complete breech extraction was  then performed.  The infant's head was delivered atraumatically.  The  oropharynx was suctioned with bulb suction.  The cord was clamped and  cut.  The infant was handed off to the awaiting neonatologist.  Placenta  was then removed.  The intrauterine cavity was evacuated of any  remaining amniotic fluid, clots and debris with moist laparotomy sponge.  The uterine incision was then reapproximated in a running interlocking  fashion using 0 Monocryl.  A second imbricating layer of the same was  then placed.  Adequate hemostasis was noted.  The paracolic gutters were  then irrigated.  The Alexis retractor was then removed.  The parietal  peritoneum was reapproximated in a running  fashion using 2-0 Vicryl.  The fascia was reapproximated in a running fashion using 0 Monocryl.  The skin was closed with staples.  At the close of the procedure, the  instrument and pack counts were said to be correct x2.  Two grams of  cephazolin had been given at cord clamp.  The patient was taken to the  PACU awake and in stable condition.      Agnes Lawrence, M.D.  Electronically Signed     LAJ/MEDQ  D:  12/27/2006  T:  12/27/2006  Job:  [id number]

## 2010-12-24 NOTE — Op Note (Signed)
[name], [name]               ACCOUNT NO.:  [id number]   MEDICAL RECORD NO.:  [id number]          PATIENT TYPE:  AMB   LOCATION:  SDS                          FACILITY:  [facility]   PHYSICIAN:  Newt Minion, MD     DATE OF BIRTH:  [birth year]   DATE OF PROCEDURE:  05/10/2006  DATE OF DISCHARGE:                                 OPERATIVE REPORT   PREOPERATIVE DIAGNOSIS:  Chronic failed right Achilles tendon  reconstruction.   POSTOPERATIVE DIAGNOSIS:  Chronic failed right Achilles tendon  reconstruction.   PROCEDURE:  1. Takedown of nonunion Achilles reconstruction.  2. Gastrocnemius recession.  3. Achilles tendon reconstruction.  4. Application of Pegasus collagen graft.   SURGEON:  Newt Minion, MD   ANESTHESIA:  General.   ESTIMATED BLOOD LOSS:  Minimal.   ANTIBIOTICS:  One gram of Kefzol.   DRAINS:  None.   COMPLICATIONS:  None.   TOURNIQUET TIME:  None.   DISPOSITION:  To PACU in stable condition.   INDICATION FOR PROCEDURE:  The patient is a 40 year old woman who several  years ago while in [place] sustained a right Achilles tendon rupture.  She  underwent open reconstruction of the Achilles tendon.  The patient has had  failure of her reconstruction with stretching out of the graft.  MRI scan  shows no continuity of intact Achilles tissue and due to failure of  conservative care, after failure of Achilles tendon reconstruction, the  patient presents at this time for takedown of the nonunion, revision and  reconstruction.  Risks and benefits were discussed including infection,  neurovascular injury, recurrent rupture, nonhealing of the incision, and  need for additional surgery.  The patient states she understands and wishes  to proceed at this time.   DESCRIPTION OF PROCEDURE:  The patient was brought to OR room #1 and  underwent a general anesthetic.  After adequate level of anesthesia was  obtained, the patient was placed prone on the operating table  and her right  lower extremity was prepped using DuraPrep and draped into a sterile field.  An incision was made centered over a previous posteromedial incision.  This  was carried down through the peritenon.  This was scarred down to the  fibrous tissue from the Achilles repair.  The proximal and distal aspects  were freed up and then freed up through the area of scar.  Approximately 2  inches of fibrous nonunion tissue were excised from the Achilles.  Using #2-  0 FiberWire and a Krawkow stitch, 4 strands were placed through the proximal  and distal poles with 4 core sutures from each end of the Achilles  reconstruction.  a gastrocnemius recession was used to allow the ends to  approximate.  The sutures were tied as core sutures individually.  There was  still a slight several-millimeter gap between the tissue ends.  The  reconstruction was then wrapped with the Pegasus graft.  This was an equine  collagen paracardial graft.  This was secured using 2-0 PDS.  This was  wrapped around the repair and secured proximally and  distally and then a  running suture vertically.  The wound was irrigated with normal saline.  The  peritenon was closed using 3-0 Monocryl.  The skin was closed using 2-0  nylon with a modified Allgower-Donati suture.  The wound was covered with  Adaptic, orthopedic sponges, sterile Webril and a posterior splint was  applied with Webril and then a Coban dressing.  The foot was kept in  plantarflexion with gentle resting plantarflexion.  The ankle was placed  through a range of motion; the ankle was able to obtain neutral  dorsiflexion.  The patient was extubated and taken to PACU in stable  condition.  Plan for discharge to home.  She was injected proximal to the  incision with a total 30 mL of 0.25% Marcaine plain.   Follow up in office in 2 weeks, rest, ice, elevation and nonweightbearing on  the right.      Newt Minion, MD  Electronically Signed      MVD/MEDQ  D:  05/10/2006  T:  05/11/2006  Job:  [id number]

## 2010-12-24 NOTE — H&P (Signed)
[name], [name]               ACCOUNT NO.:  [id number]   MEDICAL RECORD NO.:  [id number]          PATIENT TYPE:  INP   LOCATION:                                FACILITY:  [facility]   PHYSICIAN:  Agnes Lawrence, M.D.DATE OF BIRTH:  31-Jul-1971   DATE OF ADMISSION:  09/29/2006  DATE OF DISCHARGE:  10/01/2006                              HISTORY & PHYSICAL   CHIEF COMPLAINT:  The patient is a 40 year old, gravida 73, para 2, with  an estimated date of confinement of January 21, 2007, with an intrauterine  pregnancy at 23+ weeks, with likely gestational diabetes.   HISTORY OF PRESENT ILLNESS:  Please see the above.  The patient  presented to the office today for a routine visit and was found to have  2 plus glycosuria on the urine DIP.  A fasting CBG was 215.  The patient  has a history of gestational diabetes.   ALLERGIES:  NO KNOWN DRUG ALLERGIES.   MEDICATIONS:  Please see the medication reconciliation form.   OBSTETRICAL RISK FACTORS:  1. She has a history of fetal demise.  2. Chronic hypertension.  3. Asthma.  4. History of gestational diabetes.  5. Obesity.   PRENATAL LABORATORY:  Anticardiolipin IgG:  Normal.  Anticardiolipin  antibody IgM:  Negative.  Chlamydia probe:  Negative.  Urine culture and  sensitivity:  No uropathogens.  Pap smear:  Negative.  GC probe:  Negative.  Hematocrit 39.1, hemoglobin 13.2.  HIV:  Nonreactive.  Lupus  anticoagulant:  Not detected.  Platelets 248,000.  A 24-hour urine for  protein on June 28 2006, 3 mg/day.  Blood type:  AB positive.  Antibody screen:  Negative.  RPR:  Nonreactive.  Rubella:  Immune.  Sickle Cell:  Negative.  Varicella:  Immune.   PAST GYNECOLOGIC HISTORY:  She denied.   PAST MEDICAL HISTORY:  1. Hypertension.  2. Asthma.  3. Pseudo tumor cerebri.   PAST SURGICAL HISTORY:  Foot surgery.   SOCIAL HISTORY:  She is unemployed, single, does not give any  significant issue of alcohol usage, has no significant  smoking history,  denies illicit drug use.   FAMILY HISTORY:  Asthma, hypertension, heart disease, myocardial  infarction, hypercholesterolemia, adult-onset diabetes, depression, CVA.   PAST OBSTETRICAL HISTORY:  1. In April 2002, she was delivered of a 6 pounds 12 ounces female,      questionable gestational age, a vaginal delivery.  2. In January 2001, she had a spontaneous abortion.  3. In December 1997, she had a fetal demise at 64 weeks.  4. In January 1996, she was delivered of a female 8 pounds 12 ounces,      full term, vaginal delivery.  5. In January 1994, she had a second trimester fetal demise.   PHYSICAL EXAMINATION:  VITAL SIGNS:  Weight 278 pounds, blood pressure  166/85, urine dip 2 plus protein, 1 plus red blood cells, and 1 plus  ketones. Fetal heart rate 150s.  GENERAL:  Obese, no apparent distress.  ABDOMEN:  Gravid, nontender.  PELVIC:  Vaginal exam deferred.   ASSESSMENT:  Multipara  at 23 plus weeks, poor obstetrical history, now  with likely gestational diabetes.   PLAN:  1. Admission.  2. Diabetic diet and teaching.  3. Will start on an oral hypoglycemic agent.  However, the patient was      counseled regarding the possible need for insulin therapy for      glycemic control.      Agnes Lawrence, M.D.  Electronically Signed     LAJ/MEDQ  D:  09/29/2006  T:  09/29/2006  Job:  [id number]

## 2010-12-24 NOTE — Discharge Summary (Signed)
[name], [name]               ACCOUNT NO.:  [id number]   MEDICAL RECORD NO.:  [id number]          PATIENT TYPE:  INP   LOCATION:  9120                          FACILITY:  [facility]   PHYSICIAN:  Agnes Lawrence, M.D.DATE OF BIRTH:  04-14-1971   DATE OF ADMISSION:  01/11/2008  DATE OF DISCHARGE:  01/14/2008                               DISCHARGE SUMMARY   CHIEF COMPLAINT:  The patient is a 40 year old para 3 with an estimated  date of confinement of March 16, 2008, with an intrauterine pregnancy at  31+ weeks with gestational diabetes and history of chronic hypertension,  now with elevated blood pressures.   HOSPITAL COURSE:  Please see the dictated history and physical for  further details.  The patient was admitted.  Her blood pressures  remained stable on bedrest.  A 24-hour urine was collected as well as  PIH labs, and her CBGs remained in range.  The 24-hour urine met  criteria for mild preeclampsia.  She remained stable, and was  subsequently discharged to home on January 14, 2008.   DISCHARGE DIAGNOSES:  1. Intrauterine pregnancy at 31 plus weeks.  2. Chronic hypertension with mild superimposed preeclampsia.  3. Gestational diabetes.   CONDITION:  Stable.   DIET:  ADA diet, low-sodium.   MEDICATIONS:  Resume medications prior to admission.   DISPOSITION:  The patient was to follow up in the office in several  days.      Agnes Lawrence, M.D.  Electronically Signed     LAJ/MEDQ  D:  03/07/2008  T:  03/08/2008  Job:  [id number]

## 2010-12-24 NOTE — Discharge Summary (Signed)
[name], [name]               ACCOUNT NO.:  [id number]   MEDICAL RECORD NO.:  [id number]          PATIENT TYPE:  INP   LOCATION:  9155                          FACILITY:  [facility]   PHYSICIAN:  Charles A. Jodi Mourning, M.D.DATE OF BIRTH:  1970/11/05   DATE OF ADMISSION:  12/25/2007  DATE OF DISCHARGE:  12/29/2007                               DISCHARGE SUMMARY   ADMITTING DIAGNOSES:  A 28 plus weeks' gestation and pregestational  diabetes mellitus, poorly controlled.   DISCHARGE DIAGNOSES:  A 28 plus weeks' gestation; pregestational  diabetes mellitus, poorly controlled; status post diabetic management  consultation for insulin treatment.   REASON FOR ADMISSION:  A 40 year old para 3 with estimated date of  confinement of March 16, 2008, intrauterine pregnancy 28 plus weeks'  gestation with gestational diabetes and hyperglycemia.  The patient was  recently admitted several days ago with similar issues.  She per her  report had the capillary blood sugars ranging from 200-400 range at  home.   PAST MEDICAL HISTORY:  Surgeries:  Foot surgery and cesarean section.  Illnesses:  Chronic hypertension, pseudotumor cerebri, and asthma.   MEDICATIONS:  Please see medication reconciliation form.   ALLERGIES:  No known drug allergies.   OBSTETRIC RISK FACTORS:  Advanced maternal age, previous cesarean  delivery, previous preterm delivery, history of previous fetal demise,  chronic hypertension, asthma, diabetes mellitus, pseudotumor cerebri,  and obstructive sleep apnea.   SOCIAL HISTORY:  Single, living with significant other.  Does not give  any significant history of alcohol usage.  Smokes less than one-half  pack a day after recently.  Denies illicit drug use.   FAMILY HISTORY:  Asthma, hypertension, heart disease,  hypercholesterolemia, adult-onset diabetes, depression, and CVA.   PHYSICAL EXAMINATION:  GENERAL:  Well-nourished and well-developed  female in no acute distress.  She is  afebrile.  VITAL SIGNS:  Stable.  LUNGS:  Clear to auscultation bilaterally.  HEART:  Regular rate and rhythm.  ABDOMEN:  Gravid, nontender.  CERVICAL EXAM:  Omitted.   ASSESSMENT:  Intrauterine pregnancy at 56 plus weeks' gestation with  pregestational diabetes mellitus, poorly controlled.   PLAN:  Admit diabetic consultation to further titrate insulin regimen.   ADMITTING LABS:  Hemoglobin 14.1, hematocrit 41, white blood cell count  9300, platelets 242,000.  Comprehensive metabolic panel was remarkable  for glucose of 148.   HOSPITAL COURSE:  The patient was admitted and started on insulin  regimen with consultation by the diabetic teaching nurse N. Carlis Abbott, RN.  The patient's capillary blood sugars were slowly brought under good  control and by hospital day #3, the patient indicated that she could  manage her blood sugars quite well and had good capillary blood sugar  readings and was therefore discharged home on hospital day #4 to  continue her insulin regimen that was started in the hospital.   DISCHARGE DISPOSITION:  Medications:  Lantus insulin 120 units at  bedtime, NovoLog 28 units with meals.  Continue prenatal vitamins and  modified carbohydrate diet at home.  The patient is to call office for  followup appointment in 1 week.  Charles A. Jodi Mourning, M.D.  Electronically Signed     CAH/MEDQ  D:  02/29/2008  T:  03/01/2008  Job:  [id number]

## 2011-02-07 ENCOUNTER — Emergency Department (HOSPITAL_COMMUNITY)
Admission: EM | Admit: 2011-02-07 | Discharge: 2011-02-07 | Disposition: A | Discharge status: Home / Self Care | Payer: Medicaid Other | Attending: Emergency Medicine | Admitting: Emergency Medicine

## 2011-02-07 ENCOUNTER — Emergency Department (HOSPITAL_COMMUNITY): Payer: Medicaid Other

## 2011-02-07 DIAGNOSIS — M25569 Pain in unspecified knee: Secondary | ICD-10-CM | POA: Insufficient documentation

## 2011-02-07 DIAGNOSIS — E119 Type 2 diabetes mellitus without complications: Secondary | ICD-10-CM | POA: Insufficient documentation

## 2011-02-07 DIAGNOSIS — M1710 Unilateral primary osteoarthritis, unspecified knee: Secondary | ICD-10-CM | POA: Insufficient documentation

## 2011-02-07 DIAGNOSIS — Z794 Long term (current) use of insulin: Secondary | ICD-10-CM | POA: Insufficient documentation

## 2011-02-07 DIAGNOSIS — IMO0002 Reserved for concepts with insufficient information to code with codable children: Secondary | ICD-10-CM | POA: Insufficient documentation

## 2011-02-07 DIAGNOSIS — I10 Essential (primary) hypertension: Secondary | ICD-10-CM | POA: Insufficient documentation

## 2011-03-28 ENCOUNTER — Emergency Department (HOSPITAL_COMMUNITY)
Admission: EM | Admit: 2011-03-28 | Discharge: 2011-03-28 | Disposition: A | Discharge status: Home / Self Care | Payer: Medicaid Other | Attending: Emergency Medicine | Admitting: Emergency Medicine

## 2011-03-28 ENCOUNTER — Emergency Department (HOSPITAL_COMMUNITY): Payer: Medicaid Other

## 2011-03-28 DIAGNOSIS — Z79899 Other long term (current) drug therapy: Secondary | ICD-10-CM | POA: Insufficient documentation

## 2011-03-28 DIAGNOSIS — M25569 Pain in unspecified knee: Secondary | ICD-10-CM | POA: Insufficient documentation

## 2011-03-28 DIAGNOSIS — I10 Essential (primary) hypertension: Secondary | ICD-10-CM | POA: Insufficient documentation

## 2011-03-28 DIAGNOSIS — E119 Type 2 diabetes mellitus without complications: Secondary | ICD-10-CM | POA: Insufficient documentation

## 2011-03-28 DIAGNOSIS — E7800 Pure hypercholesterolemia, unspecified: Secondary | ICD-10-CM | POA: Insufficient documentation

## 2011-04-19 ENCOUNTER — Other Ambulatory Visit (HOSPITAL_COMMUNITY): Payer: Self-pay | Admitting: Orthopedic Surgery

## 2011-04-19 DIAGNOSIS — M25561 Pain in right knee: Secondary | ICD-10-CM

## 2011-04-26 ENCOUNTER — Inpatient Hospital Stay (HOSPITAL_COMMUNITY): Admission: RE | Admit: 2011-04-26 | Payer: Medicaid Other | Source: Ambulatory Visit

## 2011-05-02 ENCOUNTER — Inpatient Hospital Stay (HOSPITAL_COMMUNITY): Admission: RE | Admit: 2011-05-02 | Payer: Medicaid Other | Source: Ambulatory Visit

## 2011-05-04 ENCOUNTER — Other Ambulatory Visit (HOSPITAL_COMMUNITY): Payer: Medicaid Other

## 2011-05-04 LAB — HEMOGLOBIN A1C
Hgb A1c MFr Bld: 9.5 — ABNORMAL HIGH
Mean Plasma Glucose: 261

## 2011-05-04 LAB — URINE CULTURE
Colony Count: 25000
Special Requests: NEGATIVE

## 2011-05-04 LAB — COMPREHENSIVE METABOLIC PANEL
ALT: 23
AST: 19
Albumin: 2.6 — ABNORMAL LOW
Alkaline Phosphatase: 63
BUN: 6
CO2: 19
Calcium: 10.1
Chloride: 107
Creatinine, Ser: 0.54
GFR calc Af Amer: >60
GFR calc non Af Amer: >60
Glucose, Bld: 148 — ABNORMAL HIGH
Potassium: 4
Sodium: 135
Total Bilirubin: 0.7
Total Protein: 6.3

## 2011-05-04 LAB — CREATININE CLEARANCE, URINE, 24 HOUR
Collection Interval-CRCL: 24
Creatinine Clearance: 296 — ABNORMAL HIGH
Creatinine, 24H Ur: 2301 — ABNORMAL HIGH
Creatinine, Urine: 39
Creatinine: 0.54
Urine Total Volume-CRCL: 5900

## 2011-05-04 LAB — CBC
HCT: 36.4
HCT: 41.3
Hemoglobin: 12.6
Hemoglobin: 14.1
MCHC: 34.3
MCHC: 34.6
MCV: 81.3
MCV: 82.2
Platelets: 201
Platelets: 242
RBC: 4.48
RBC: 5.02
RDW: 15.2
RDW: 15.2
WBC: 7.4
WBC: 9.3

## 2011-05-04 LAB — PROTEIN, URINE, 24 HOUR
Collection Interval-UPROT: 24
Protein, 24H Urine: 295 — ABNORMAL HIGH
Protein, Urine: 5
Urine Total Volume-UPROT: 5900

## 2011-05-04 LAB — URINALYSIS, ROUTINE W REFLEX MICROSCOPIC
Bilirubin Urine: NEGATIVE
Glucose, UA: >1000 — AB
Ketones, ur: 15 — AB
Nitrite: NEGATIVE
Protein, ur: NEGATIVE
Specific Gravity, Urine: 1.015
Urobilinogen, UA: 0.2
pH: 6.5

## 2011-05-04 LAB — URINE MICROSCOPIC-ADD ON

## 2011-05-04 LAB — URIC ACID: Uric Acid, Serum: 4.9

## 2011-05-04 LAB — KETONES, URINE: Ketones, ur: NEGATIVE

## 2011-05-04 LAB — LACTATE DEHYDROGENASE: LDH: 130

## 2011-05-04 LAB — RPR: RPR Ser Ql: NONREACTIVE

## 2011-05-05 LAB — DIFFERENTIAL
Basophils Absolute: 0
Basophils Relative: 0
Eosinophils Absolute: 0.1
Eosinophils Relative: 1
Lymphocytes Relative: 20
Lymphs Abs: 2
Monocytes Absolute: 0.5
Monocytes Relative: 5
Neutro Abs: 7.5
Neutrophils Relative %: 74

## 2011-05-05 LAB — COMPREHENSIVE METABOLIC PANEL
ALT: 20
ALT: 20
ALT: 29
AST: 21
AST: 27
AST: 30
Albumin: 2.4 — ABNORMAL LOW
Albumin: 1.7 — ABNORMAL LOW
Albumin: 2.2 — ABNORMAL LOW
Alkaline Phosphatase: 57
Alkaline Phosphatase: 55
Alkaline Phosphatase: 78
BUN: 8
BUN: 5 — ABNORMAL LOW
BUN: 6
CO2: 25
CO2: 24
CO2: 26
Calcium: 9.5
Calcium: 7.2 — ABNORMAL LOW
Calcium: 8.5
Chloride: 99
Chloride: 103
Chloride: 104
Creatinine, Ser: 0.57
Creatinine, Ser: 0.45
Creatinine, Ser: 0.59
GFR calc Af Amer: >60
GFR calc Af Amer: >60
GFR calc Af Amer: >60
GFR calc non Af Amer: >60
GFR calc non Af Amer: >60
GFR calc non Af Amer: >60
Glucose, Bld: 217 — ABNORMAL HIGH
Glucose, Bld: 63 — ABNORMAL LOW
Glucose, Bld: 69 — ABNORMAL LOW
Potassium: 3.7
Potassium: 3.2 — ABNORMAL LOW
Potassium: 3.7
Sodium: 133 — ABNORMAL LOW
Sodium: 134 — ABNORMAL LOW
Sodium: 134 — ABNORMAL LOW
Total Bilirubin: 0.1 — ABNORMAL LOW
Total Bilirubin: 0.5
Total Bilirubin: 0.5
Total Protein: 6.5
Total Protein: 4.8 — ABNORMAL LOW
Total Protein: 6.3

## 2011-05-05 LAB — URINE MICROSCOPIC-ADD ON

## 2011-05-05 LAB — URINALYSIS, ROUTINE W REFLEX MICROSCOPIC
Bilirubin Urine: NEGATIVE
Bilirubin Urine: NEGATIVE
Glucose, UA: 500 — AB
Glucose, UA: NEGATIVE
Ketones, ur: NEGATIVE
Ketones, ur: NEGATIVE
Leukocytes, UA: NEGATIVE
Nitrite: NEGATIVE
Nitrite: NEGATIVE
Protein, ur: 100 — AB
Protein, ur: >300 — AB
Specific Gravity, Urine: >1.03 — ABNORMAL HIGH
Specific Gravity, Urine: 1.02
Urobilinogen, UA: 0.2
Urobilinogen, UA: 0.2
pH: 5.5
pH: 7

## 2011-05-05 LAB — CBC
HCT: 38.2
HCT: 29.8 — ABNORMAL LOW
HCT: 40.5
Hemoglobin: 13.4
Hemoglobin: 10.3 — ABNORMAL LOW
Hemoglobin: 13.9
MCHC: 35
MCHC: 34.3
MCHC: 34.5
MCV: 81.3
MCV: 82.4
MCV: 83.8
Platelets: 223
Platelets: 179
Platelets: 225
RBC: 4.7
RBC: 3.56 — ABNORMAL LOW
RBC: 4.92
RDW: 15.2
RDW: 15.4
RDW: 15.6 — ABNORMAL HIGH
WBC: 10.1
WBC: 8.2
WBC: 9.2

## 2011-05-05 LAB — LACTATE DEHYDROGENASE
LDH: 118
LDH: 171
LDH: 202

## 2011-05-05 LAB — RPR: RPR Ser Ql: NONREACTIVE

## 2011-05-05 LAB — CREATININE, URINE, 24 HOUR
Collection Interval-UCRE24: 24
Creatinine, 24H Ur: 2456 — ABNORMAL HIGH
Creatinine, Urine: 61.4
Urine Total Volume-UCRE24: 4000

## 2011-05-05 LAB — URIC ACID
Uric Acid, Serum: 5.9
Uric Acid, Serum: 6.9
Uric Acid, Serum: 7.4 — ABNORMAL HIGH

## 2011-05-05 LAB — PROTEIN, URINE, 24 HOUR
Collection Interval-UPROT: 24
Protein, 24H Urine: 640 — ABNORMAL HIGH
Protein, Urine: 16
Urine Total Volume-UPROT: 4000

## 2011-08-25 ENCOUNTER — Encounter (HOSPITAL_COMMUNITY): Payer: Self-pay

## 2011-08-25 ENCOUNTER — Emergency Department (HOSPITAL_COMMUNITY)
Admission: EM | Admit: 2011-08-25 | Discharge: 2011-08-25 | Disposition: A | Discharge status: Home / Self Care | Payer: Medicaid Other | Attending: Emergency Medicine | Admitting: Emergency Medicine

## 2011-08-25 DIAGNOSIS — L03116 Cellulitis of left lower limb: Secondary | ICD-10-CM

## 2011-08-25 DIAGNOSIS — L02619 Cutaneous abscess of unspecified foot: Secondary | ICD-10-CM | POA: Insufficient documentation

## 2011-08-25 DIAGNOSIS — M79609 Pain in unspecified limb: Secondary | ICD-10-CM | POA: Insufficient documentation

## 2011-08-25 DIAGNOSIS — L03119 Cellulitis of unspecified part of limb: Secondary | ICD-10-CM | POA: Insufficient documentation

## 2011-08-25 DIAGNOSIS — M7989 Other specified soft tissue disorders: Secondary | ICD-10-CM | POA: Insufficient documentation

## 2011-08-25 DIAGNOSIS — E119 Type 2 diabetes mellitus without complications: Secondary | ICD-10-CM | POA: Insufficient documentation

## 2011-08-25 DIAGNOSIS — F17200 Nicotine dependence, unspecified, uncomplicated: Secondary | ICD-10-CM | POA: Insufficient documentation

## 2011-08-25 DIAGNOSIS — I10 Essential (primary) hypertension: Secondary | ICD-10-CM | POA: Insufficient documentation

## 2011-08-25 HISTORY — DX: Unspecified osteoarthritis, unspecified site: M19.90

## 2011-08-25 HISTORY — DX: Essential (primary) hypertension: I10

## 2011-08-25 LAB — URINALYSIS, DIPSTICK ONLY
Bilirubin Urine: NEGATIVE
Glucose, UA: >1000 mg/dL — AB
Hgb urine dipstick: NEGATIVE
Ketones, ur: NEGATIVE mg/dL
Leukocytes, UA: NEGATIVE
Nitrite: NEGATIVE
Protein, ur: 30 mg/dL — AB
Specific Gravity, Urine: 1.028 (ref 1.005–1.030)
Urobilinogen, UA: 0.2 mg/dL (ref 0.0–1.0)
pH: 6 (ref 5.0–8.0)

## 2011-08-25 LAB — DIFFERENTIAL
Basophils Absolute: 0 10*3/uL (ref 0.0–0.1)
Basophils Relative: 0 % (ref 0–1)
Eosinophils Absolute: 0.3 10*3/uL (ref 0.0–0.7)
Eosinophils Relative: 4 % (ref 0–5)
Lymphocytes Relative: 30 % (ref 12–46)
Lymphs Abs: 2.9 10*3/uL (ref 0.7–4.0)
Monocytes Absolute: 0.5 10*3/uL (ref 0.1–1.0)
Monocytes Relative: 5 % (ref 3–12)
Neutro Abs: 5.8 10*3/uL (ref 1.7–7.7)
Neutrophils Relative %: 61 % (ref 43–77)

## 2011-08-25 LAB — BASIC METABOLIC PANEL
BUN: 8 mg/dL (ref 6–23)
CO2: 26 mEq/L (ref 19–32)
Calcium: 9.3 mg/dL (ref 8.4–10.5)
Chloride: 99 mEq/L (ref 96–112)
Creatinine, Ser: 0.5 mg/dL (ref 0.50–1.10)
GFR calc Af Amer: >90 mL/min (ref >90)
GFR calc non Af Amer: >90 mL/min (ref >90)
Glucose, Bld: 251 mg/dL — ABNORMAL HIGH (ref 70–99)
Potassium: 3.4 mEq/L — ABNORMAL LOW (ref 3.5–5.1)
Sodium: 136 mEq/L (ref 135–145)

## 2011-08-25 LAB — CBC
HCT: 37.6 % (ref 36.0–46.0)
Hemoglobin: 13.3 g/dL (ref 12.0–15.0)
MCH: 29.5 pg (ref 26.0–34.0)
MCHC: 35.4 g/dL (ref 30.0–36.0)
MCV: 83.4 fL (ref 78.0–100.0)
Platelets: 221 10*3/uL (ref 150–400)
RBC: 4.51 MIL/uL (ref 3.87–5.11)
RDW: 13.5 % (ref 11.5–15.5)
WBC: 9.6 10*3/uL (ref 4.0–10.5)

## 2011-08-25 MED ORDER — VANCOMYCIN HCL 1000 MG IV SOLR
1500.0000 mg | INTRAVENOUS | Status: AC
  Administered 2011-08-25: 1500 mg via INTRAVENOUS
  Filled 2011-08-25: qty 1500

## 2011-08-25 MED ORDER — VANCOMYCIN HCL 10 G IV SOLR: 1500.0000 mg | Freq: Once | INTRAVENOUS | Status: DC

## 2011-08-25 MED ORDER — CLINDAMYCIN HCL 300 MG PO CAPS: 300.0000 mg | ORAL_CAPSULE | Freq: Four times a day (QID) | ORAL | Status: AC

## 2011-08-25 MED ORDER — SODIUM CHLORIDE 0.9 % IV BOLUS (SEPSIS)
1000.0000 mL | Freq: Once | INTRAVENOUS | Status: AC
  Administered 2011-08-25: 1000 mL via INTRAVENOUS

## 2011-08-25 MED ORDER — HYDROCODONE-ACETAMINOPHEN 5-325 MG PO TABS
1.0000 | ORAL_TABLET | Freq: Once | ORAL | Status: AC
  Administered 2011-08-25: 1 via ORAL
  Filled 2011-08-25: qty 1

## 2011-08-25 MED ORDER — METFORMIN HCL 500 MG PO TABS: 500.0000 mg | ORAL_TABLET | Freq: Two times a day (BID) | ORAL | Status: DC

## 2011-08-25 MED ORDER — LISINOPRIL-HYDROCHLOROTHIAZIDE 10-12.5 MG PO TABS: 1.0000 | ORAL_TABLET | Freq: Every day | ORAL | Status: DC

## 2011-08-25 NOTE — ED Provider Notes (Signed)
History     CSN: [id number]  Arrival date & time 08/25/11  1706   First MD Initiated Contact with Patient 08/25/11 1817      Chief Complaint  Patient presents with  . Foot Pain    (Consider location/radiation/quality/duration/timing/severity/associated sxs/prior treatment) HPI Comments: Patient presents complaining of left great toe redness and swelling that is now radiating up her leg.  She notes that the redness extends up the medial aspect of her foot and upper calf.  She denies any fevers.  She has some mild discomfort with this as well.  Patient denies any history of gout.  She does note a history of hypertension and diabetes for which she is not on medications currently.  Pain is worse with palpation.  Patient is a 41 y.o. female presenting with lower extremity pain. The history is provided by the patient. No language interpreter was used.  Foot Pain This is a new problem. The current episode started 12 to 24 hours ago. The problem occurs constantly. The problem has not changed since onset.Pertinent negatives include no chest pain, no abdominal pain, no headaches and no shortness of breath. The symptoms are aggravated by nothing. The symptoms are relieved by nothing. She has tried nothing for the symptoms.    Past Medical History  Diagnosis Date  . Hypertension   . Diabetes mellitus   . Arthritis     Past Surgical History  Procedure Date  . Achilles tendon repair     No family history on file.  History  Substance Use Topics  . Smoking status: Current Everyday Smoker -- 0.5 packs/day  . Smokeless tobacco: Not on file  . Alcohol Use: No    OB History    Grav Para Term Preterm Abortions TAB SAB Ect Mult Living                  Review of Systems  Constitutional: Negative.  Negative for fever and chills.  HENT: Negative.   Eyes: Negative.  Negative for discharge and redness.  Respiratory: Negative.  Negative for cough and shortness of breath.   Cardiovascular:  Negative.  Negative for chest pain.  Gastrointestinal: Negative.  Negative for nausea, vomiting, abdominal pain and diarrhea.  Genitourinary: Negative.  Negative for dysuria and vaginal discharge.  Musculoskeletal: Positive for joint swelling. Negative for back pain.  Skin: Positive for color change. Negative for rash.  Neurological: Negative.  Negative for syncope and headaches.  Hematological: Negative.  Negative for adenopathy.  Psychiatric/Behavioral: Negative.  Negative for confusion.  All other systems reviewed and are negative.    Allergies  Review of patient's allergies indicates no known allergies.  Home Medications  No current outpatient prescriptions on file.  BP 176/108  Pulse 107  Temp(Src) 98.5 F (36.9 C) (Oral)  Resp 20  SpO2 96%  LMP 07/23/2011  Physical Exam  Nursing note and vitals reviewed. Constitutional: She is oriented to person, place, and time. She appears well-developed and well-nourished.  Non-toxic appearance. She does not have a sickly appearance.  HENT:  Head: Normocephalic and atraumatic.  Eyes: Conjunctivae, EOM and lids are normal. Pupils are equal, round, and reactive to light. No scleral icterus.  Neck: Trachea normal and normal range of motion. Neck supple.  Cardiovascular: Regular rhythm and normal heart sounds.   Pulmonary/Chest: Effort normal and breath sounds normal.  Abdominal: Soft. Normal appearance. There is no tenderness. There is no CVA tenderness.  Musculoskeletal: Normal range of motion.  Neurological: She is alert and oriented  to person, place, and time. She has normal strength.  Skin: Skin is warm, dry and intact. No rash noted. There is erythema.       Patient has mild swelling to the left great toe with erythema present there is erythema that goes up the medial aspect of her foot and slightly up the anterior aspect of her left lower leg.  These areas are tender to palpation.  There is no crepitus present.  Palpable DP pulses  present.  Psychiatric: She has a normal mood and affect. Her behavior is normal. Judgment and thought content normal.    ED Course  Procedures (including critical care time)  Results for orders placed during the hospital encounter of 08/25/11  CBC      Component Value Range   WBC 9.6  4.0 - 10.5 (K/uL)   RBC 4.51  3.87 - 5.11 (MIL/uL)   Hemoglobin 13.3  12.0 - 15.0 (g/dL)   HCT 37.6  36.0 - 46.0 (%)   MCV 83.4  78.0 - 100.0 (fL)   MCH 29.5  26.0 - 34.0 (pg)   MCHC 35.4  30.0 - 36.0 (g/dL)   RDW 13.5  11.5 - 15.5 (%)   Platelets 221  150 - 400 (K/uL)  DIFFERENTIAL      Component Value Range   Neutrophils Relative 61  43 - 77 (%)   Neutro Abs 5.8  1.7 - 7.7 (K/uL)   Lymphocytes Relative 30  12 - 46 (%)   Lymphs Abs 2.9  0.7 - 4.0 (K/uL)   Monocytes Relative 5  3 - 12 (%)   Monocytes Absolute 0.5  0.1 - 1.0 (K/uL)   Eosinophils Relative 4  0 - 5 (%)   Eosinophils Absolute 0.3  0.0 - 0.7 (K/uL)   Basophils Relative 0  0 - 1 (%)   Basophils Absolute 0.0  0.0 - 0.1 (K/uL)  BASIC METABOLIC PANEL      Component Value Range   Sodium 136  135 - 145 (mEq/L)   Potassium 3.4 (*) 3.5 - 5.1 (mEq/L)   Chloride 99  96 - 112 (mEq/L)   CO2 26  19 - 32 (mEq/L)   Glucose, Bld 251 (*) 70 - 99 (mg/dL)   BUN 8  6 - 23 (mg/dL)   Creatinine, Ser 0.50  0.50 - 1.10 (mg/dL)   Calcium 9.3  8.4 - 10.5 (mg/dL)   GFR calc non Af Amer >90  >90 (mL/min)   GFR calc Af Amer >90  >90 (mL/min)       MDM  Patient presents with cellulitis of her right foot and upper right leg.  Patient is a diabetic but her sugar is only moderately elevated at this time 251.  She is not in DKA at this time.  Patient has been given IV fluids for further hydration.  She's been given a dose of vancomycin for initial treatment for the infection since she reports a history of this spreading quickly.  Patient has no crepitus over the wound or other significant signs the patient is systemically ill.  She is off her blood pressure  medications as well which likely explains the patient's elevated blood pressure here today.  Patient will be discharged with antibiotics for home and strict instructions to return if the redness is worsening instead of improving.  I will begin the patient on diabetic and hypertensive medications as well.        Lezlie Octave, MD 08/25/11 [phone]

## 2011-08-25 NOTE — ED Notes (Signed)
Patient C/O pain and swelling in her left leg that began last night.  States that her left great toe was swollen this  AM. During the day, the pain began to spread though out  her foot.  Her left foot and leg is swollen and warm. Patient C/O pain through out. Her left great toe is dark. ++ dorsalis pedis pulse noted.

## 2011-08-25 NOTE — ED Notes (Signed)
Report received from [place], [place].  Pt requesting pain medication.  Joaquim Lai, [place] notified and no orders received.   Pt concerned about not getting a cab voucher to go home and not seeing the social worker for transportation.  Cabs are not running due to weather conditions.  Explained same to pt and she is yelling and cursing.  Offered pt bus pass in am and she declined.  Pt removed her own IV.  Pt ambulatory to discharge desk.

## 2011-08-25 NOTE — ED Notes (Signed)
Pt presents with onset of L foot pain and swelling since she awoke this morning.  Pt denies any injury, reports redness to foot and soreness that extends into thigh.

## 2012-09-05 ENCOUNTER — Other Ambulatory Visit: Payer: Self-pay | Admitting: Internal Medicine

## 2012-09-05 DIAGNOSIS — Z1231 Encounter for screening mammogram for malignant neoplasm of breast: Secondary | ICD-10-CM

## 2012-09-21 ENCOUNTER — Ambulatory Visit: Payer: Medicaid Other

## 2012-10-05 ENCOUNTER — Ambulatory Visit
Admission: RE | Admit: 2012-10-05 | Discharge: 2012-10-05 | Disposition: A | Discharge status: Home / Self Care | Payer: Medicaid Other | Source: Ambulatory Visit | Attending: Internal Medicine | Admitting: Internal Medicine

## 2012-10-05 DIAGNOSIS — Z1231 Encounter for screening mammogram for malignant neoplasm of breast: Secondary | ICD-10-CM

## 2012-10-18 ENCOUNTER — Encounter: Payer: Self-pay | Admitting: *Deleted

## 2012-10-18 ENCOUNTER — Encounter: Payer: Medicaid Other | Attending: Internal Medicine | Admitting: *Deleted

## 2012-10-18 VITALS — Ht 69.0 in | Wt 233.1 lb

## 2012-10-18 DIAGNOSIS — Z713 Dietary counseling and surveillance: Secondary | ICD-10-CM | POA: Insufficient documentation

## 2012-10-18 DIAGNOSIS — E119 Type 2 diabetes mellitus without complications: Secondary | ICD-10-CM | POA: Insufficient documentation

## 2012-10-18 NOTE — Progress Notes (Signed)
  Patient was seen on 10/18/12 for the first of a series of three diabetes self-management courses at the [facility]. The following learning objectives were met by the patient during this course:   Defines the role of glucose and insulin  Identifies type of diabetes and pathophysiology  Defines the diagnostic criteria for diabetes and prediabetes  States the risk factors for Type 2 Diabetes  States the symptoms of Type 2 Diabetes  Defines Type 2 Diabetes treatment goals  Defines Type 2 Diabetes treatment options  States the rationale for glucose monitoring  Identifies A1C, glucose targets, and testing times  Identifies proper sharps disposal  Defines the purpose of a diabetes food plan  Identifies carbohydrate food groups  Defines effects of carbohydrate foods on glucose levels  Identifies carbohydrate choices/grams/food labels  States benefits of physical activity and effect on glucose  Review of suggested activity guidelines  Handouts given during class include:  Type 2 Diabetes: Basics Book  My [place] and Activity Log  Follow-Up Plan: Attend core 2 and core 3

## 2012-10-18 NOTE — Patient Instructions (Signed)
Goals:  Follow Diabetes Meal Plan as instructed  Eat 3 meals and 2 snacks, every 3-5 hrs  Limit carbohydrate intake to 30-45 grams carbohydrate/meal  Limit carbohydrate intake to 15 grams carbohydrate/snack  Add lean protein foods to meals/snacks  Monitor glucose levels as instructed by your doctor  Aim for 30 mins of physical activity daily  Bring food record and glucose log to your next nutrition visit 

## 2012-11-08 ENCOUNTER — Encounter: Payer: Medicaid Other | Attending: Internal Medicine | Admitting: Dietician

## 2012-11-08 DIAGNOSIS — E119 Type 2 diabetes mellitus without complications: Secondary | ICD-10-CM | POA: Insufficient documentation

## 2012-11-08 DIAGNOSIS — Z713 Dietary counseling and surveillance: Secondary | ICD-10-CM | POA: Insufficient documentation

## 2012-11-08 NOTE — Progress Notes (Signed)
  Patient was seen on 11/08/2012 for the second of a series of three diabetes self-management courses at the [facility]. The following learning objectives were met by the patient during this course:   Explain basic nutrition maintenance and quality assurance  Describe causes, symptoms and treatment of hypoglycemia and hyperglycemia  Explain how to manage diabetes during illness  Describe the importance of good nutrition for health and healthy eating strategies  List strategies to follow meal plan when dining out  Describe the effects of alcohol on glucose and how to use it safely  Describe problem solving skills for day-to-day glucose challenges  Describe strategies to use when treatment plan needs to change  Identify important factors involved in successful weight loss  Describe ways to remain physically active  Describe the impact of regular activity on insulin resistance   Handouts given in class:  Refrigerator magnet for Sick Day Guidelines  [facility] Oral medication/insulin handout  Weight Loss Handout  Follow-Up Plan: Patient will attend the final class of the ADA Diabetes Self-Care Education.

## 2013-06-19 ENCOUNTER — Telehealth: Payer: Self-pay | Admitting: *Deleted

## 2013-06-19 NOTE — Telephone Encounter (Signed)
Pt scheduled surgery with Dr Milinda Pointer for 07/19/2013.  I transferred pt to scheduler to set up consultation appt.

## 2013-07-03 ENCOUNTER — Encounter: Payer: Self-pay | Admitting: Podiatry

## 2013-07-09 ENCOUNTER — Encounter: Payer: Self-pay | Admitting: Podiatry

## 2013-07-09 ENCOUNTER — Ambulatory Visit (INDEPENDENT_AMBULATORY_CARE_PROVIDER_SITE_OTHER): Payer: Medicaid Other | Admitting: Podiatry

## 2013-07-09 VITALS — BP 164/99 | HR 91 | Resp 16

## 2013-07-09 DIAGNOSIS — M2030 Hallux varus (acquired), unspecified foot: Secondary | ICD-10-CM

## 2013-07-09 DIAGNOSIS — M205X1 Other deformities of toe(s) (acquired), right foot: Secondary | ICD-10-CM

## 2013-07-09 NOTE — Progress Notes (Signed)
[name] presents today for surgical consult. She relates her blood sugars have been recently out-of-control registering well in the 200s. She is requesting digital surgery. She is not followup with her primary Dr. in several months.  Objective: Pulses remain palpable bilateral. Severe hammertoe deformity deformities 1 through 5 right foot greater than left. 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  Plan: We have postponed her surgical intervention secondary to her high A1c and poor glycemic control. I will followup with her in the spring for surgical consideration.

## 2014-10-30 ENCOUNTER — Other Ambulatory Visit: Payer: Self-pay

## 2014-10-30 DIAGNOSIS — Z1231 Encounter for screening mammogram for malignant neoplasm of breast: Secondary | ICD-10-CM

## 2014-11-04 ENCOUNTER — Ambulatory Visit
Admission: RE | Admit: 2014-11-04 | Discharge: 2014-11-04 | Disposition: A | Discharge status: Home / Self Care | Payer: Medicaid Other | Source: Ambulatory Visit

## 2014-11-04 DIAGNOSIS — Z1231 Encounter for screening mammogram for malignant neoplasm of breast: Secondary | ICD-10-CM

## 2015-06-28 ENCOUNTER — Encounter (HOSPITAL_COMMUNITY): Payer: Self-pay | Admitting: Emergency Medicine

## 2015-06-28 ENCOUNTER — Emergency Department (HOSPITAL_COMMUNITY)
Admission: EM | Admit: 2015-06-28 | Discharge: 2015-06-28 | Disposition: A | Discharge status: Home / Self Care | Payer: Medicaid Other | Attending: Emergency Medicine | Admitting: Emergency Medicine

## 2015-06-28 DIAGNOSIS — Z79899 Other long term (current) drug therapy: Secondary | ICD-10-CM | POA: Diagnosis not present

## 2015-06-28 DIAGNOSIS — Z3202 Encounter for pregnancy test, result negative: Secondary | ICD-10-CM | POA: Insufficient documentation

## 2015-06-28 DIAGNOSIS — F17200 Nicotine dependence, unspecified, uncomplicated: Secondary | ICD-10-CM | POA: Insufficient documentation

## 2015-06-28 DIAGNOSIS — Z86011 Personal history of benign neoplasm of the brain: Secondary | ICD-10-CM | POA: Diagnosis not present

## 2015-06-28 DIAGNOSIS — N939 Abnormal uterine and vaginal bleeding, unspecified: Secondary | ICD-10-CM | POA: Diagnosis not present

## 2015-06-28 DIAGNOSIS — Z8739 Personal history of other diseases of the musculoskeletal system and connective tissue: Secondary | ICD-10-CM | POA: Insufficient documentation

## 2015-06-28 DIAGNOSIS — E119 Type 2 diabetes mellitus without complications: Secondary | ICD-10-CM | POA: Diagnosis not present

## 2015-06-28 DIAGNOSIS — I10 Essential (primary) hypertension: Secondary | ICD-10-CM | POA: Insufficient documentation

## 2015-06-28 DIAGNOSIS — E876 Hypokalemia: Secondary | ICD-10-CM | POA: Insufficient documentation

## 2015-06-28 HISTORY — DX: Benign intracranial hypertension: G93.2

## 2015-06-28 LAB — CBC WITH DIFFERENTIAL/PLATELET
Basophils Absolute: 0 10*3/uL (ref 0.0–0.1)
Basophils Relative: 0 %
Eosinophils Absolute: 0.4 10*3/uL (ref 0.0–0.7)
Eosinophils Relative: 4 %
HCT: 38.3 % (ref 36.0–46.0)
Hemoglobin: 13.8 g/dL (ref 12.0–15.0)
Lymphocytes Relative: 29 %
Lymphs Abs: 2.8 10*3/uL (ref 0.7–4.0)
MCH: 30.9 pg (ref 26.0–34.0)
MCHC: 36 g/dL (ref 30.0–36.0)
MCV: 85.7 fL (ref 78.0–100.0)
Monocytes Absolute: 0.4 10*3/uL (ref 0.1–1.0)
Monocytes Relative: 4 %
Neutro Abs: 6 10*3/uL (ref 1.7–7.7)
Neutrophils Relative %: 63 %
Platelets: 227 10*3/uL (ref 150–400)
RBC: 4.47 MIL/uL (ref 3.87–5.11)
RDW: 14 % (ref 11.5–15.5)
WBC: 9.5 10*3/uL (ref 4.0–10.5)

## 2015-06-28 LAB — BASIC METABOLIC PANEL
Anion gap: 12 (ref 5–15)
BUN: 9 mg/dL (ref 6–20)
CO2: 27 mmol/L (ref 22–32)
Calcium: 8.9 mg/dL (ref 8.9–10.3)
Chloride: 97 mmol/L — ABNORMAL LOW (ref 101–111)
Creatinine, Ser: 0.85 mg/dL (ref 0.44–1.00)
GFR calc Af Amer: >60 mL/min (ref >60)
GFR calc non Af Amer: >60 mL/min (ref >60)
Glucose, Bld: 297 mg/dL — ABNORMAL HIGH (ref 65–99)
Potassium: 2.8 mmol/L — ABNORMAL LOW (ref 3.5–5.1)
Sodium: 136 mmol/L (ref 135–145)

## 2015-06-28 LAB — I-STAT BETA HCG BLOOD, ED (MC, WL, AP ONLY): I-stat hCG, quantitative: <5 m[IU]/mL (ref <5)

## 2015-06-28 MED ORDER — POTASSIUM CHLORIDE CRYS ER 20 MEQ PO TBCR
40.0000 meq | EXTENDED_RELEASE_TABLET | Freq: Once | ORAL | Status: AC
  Administered 2015-06-28: 40 meq via ORAL
  Filled 2015-06-28: qty 2

## 2015-06-28 MED ORDER — HYDROCHLOROTHIAZIDE 25 MG PO TABS: 25.0000 mg | ORAL_TABLET | Freq: Every day | ORAL | Status: DC

## 2015-06-28 MED ORDER — HYDROCHLOROTHIAZIDE 25 MG PO TABS
25.0000 mg | ORAL_TABLET | Freq: Every day | ORAL | Status: DC
  Administered 2015-06-28: 25 mg via ORAL
  Filled 2015-06-28: qty 1

## 2015-06-28 MED ORDER — DIPHENHYDRAMINE HCL 25 MG PO CAPS
25.0000 mg | ORAL_CAPSULE | Freq: Once | ORAL | Status: AC
  Administered 2015-06-28: 25 mg via ORAL
  Filled 2015-06-28: qty 1

## 2015-06-28 NOTE — Discharge Instructions (Signed)

## 2015-06-28 NOTE — ED Notes (Addendum)
Pt. reports vaginal bleeding onset last Friday , denies injury , no urinary discomfort or fever ,. Hypertensive at triage , pt. stated no antihypertensive medications for several months .

## 2015-06-28 NOTE — ED Provider Notes (Signed)
CSN: [id number]     Arrival date & time 06/28/15  1919 History   None    Chief Complaint  Patient presents with  . Vaginal Bleeding    Patient is a 44 y.o. female presenting with vaginal bleeding. The history is provided by the patient.  Vaginal Bleeding Quality:  Bright red and clots Severity:  Moderate Onset quality:  Gradual Duration:  9 days Timing:  Constant Progression:  Unchanged Chronicity:  New Menstrual history:  Regular Number of pads used:  2 boxes, 2 at a time Possible pregnancy: no   Relieved by:  Nothing Associated symptoms: no abdominal pain, no back pain, no dizziness, no dysuria, no fatigue, no fever, no nausea and no vaginal discharge   Risk factors: ovarian cysts     Past Medical History  Diagnosis Date  . Hypertension   . Diabetes mellitus   . Arthritis   . Pseudotumor cerebri    Past Surgical History  Procedure Laterality Date  . Achilles tendon repair     Family History  Problem Relation Age of Onset  . Asthma Other   . Hyperlipidemia Other   . Hypertension Other   . Cancer Other    Social History  Substance Use Topics  . Smoking status: Current Every Day Smoker -- 0.50 packs/day  . Smokeless tobacco: None  . Alcohol Use: No   OB History    No data available     Review of Systems  Constitutional: Negative for fever and fatigue.  HENT: Negative for rhinorrhea and sore throat.   Eyes: Negative for visual disturbance.  Respiratory: Negative for chest tightness and shortness of breath.   Cardiovascular: Negative for chest pain and palpitations.  Gastrointestinal: Negative for nausea, vomiting, abdominal pain and constipation.  Genitourinary: Positive for vaginal bleeding. Negative for dysuria, hematuria and vaginal discharge.  Musculoskeletal: Negative for back pain and neck pain.  Skin: Negative for rash.  Neurological: Negative for dizziness and headaches.  Psychiatric/Behavioral: Negative for confusion.  All other systems reviewed  and are negative.  Allergies  Review of patient's allergies indicates no known allergies.  Home Medications   Prior to Admission medications   Medication Sig Start Date End Date Taking? Authorizing Provider  hydrochlorothiazide (HYDRODIURIL) 25 MG tablet Take 1 tablet (25 mg total) by mouth daily. 06/28/15   Gustavus Bryant, MD  lisinopril-hydrochlorothiazide (PRINZIDE) 10-12.5 MG per tablet Take 1 tablet by mouth daily. 08/25/11 08/24/12  Cassell Smiles, MD  metFORMIN (GLUCOPHAGE) 500 MG tablet Take 1 tablet (500 mg total) by mouth 2 (two) times daily with a meal. 08/25/11 08/24/12  Cassell Smiles, MD   BP 167/142 mmHg  Pulse 97  Temp(Src) 98.4 F (36.9 C) (Oral)  Resp 16  Ht 5\' 8"  (1.727 m)  Wt 214 lb (97.07 kg)  BMI 32.55 kg/m2  SpO2 100%  LMP 05/25/2015 Physical Exam  Constitutional: She is oriented to person, place, and time. She appears well-developed and well-nourished. No distress.  HENT:  Head: Normocephalic and atraumatic.  Mouth/Throat: Oropharynx is clear and moist.  Eyes: EOM are normal. Pupils are equal, round, and reactive to light.  Neck: Neck supple. No JVD present.  Cardiovascular: Normal rate, regular rhythm, normal heart sounds and intact distal pulses.  Exam reveals no gallop.   No murmur heard. Pulmonary/Chest: Effort normal and breath sounds normal. She has no wheezes. She has no rales.  Abdominal: Soft. She exhibits no distension. There is no tenderness.  Genitourinary: Cervix exhibits no motion tenderness. Right adnexum displays  no mass and no tenderness. Left adnexum displays no mass and no tenderness.  Difficult to visualize cervix. Old blood in vaginal canal. No lesions.  Musculoskeletal: Normal range of motion. She exhibits no tenderness.  Neurological: She is alert and oriented to person, place, and time. No cranial nerve deficit. She exhibits normal muscle tone.  Skin: Skin is warm and dry. No rash noted.  Psychiatric: Her behavior is normal.    ED  Course  Procedures  None   Labs Review Labs Reviewed  BASIC METABOLIC PANEL - Abnormal; Notable for the following:    Potassium 2.8 (*)    Chloride 97 (*)    Glucose, Bld 297 (*)    All other components within normal limits  CBC WITH DIFFERENTIAL/PLATELET  I-STAT BETA HCG BLOOD, ED ([facility], [facility], [facility] ONLY)    MDM   Final diagnoses:  Vaginal bleeding  Essential hypertension  Hypokalemia    44 year old African-American female presents with vaginal bleeding for 9 days. She started her period on the 17th and has continued to have daily bleeding. She states she uses 2 pads at a time and has been through almost 2 boxes of pads. She denies chest pain, shortness of breath, lightheadedness, dizziness. She denies vaginal discharge, vaginal or pelvic pain, pain with intercourse, or bloody stools or dysuria. [name] afebrile with vital signs stable. On exam she has no abdominal tenderness. There is dried vaginal blood in the vault with no cervical motion or adnexal tenderness. Suspect she could have ruptured an ovarian cyst that she has had cysts in the past. Could also have fibroids. Recommend follow-up with OB for nonemergent ultrasound. Vernard Gambles is also notably itching from her eczema. Benadryl given. Patient also noted to be hypokalemic with a potassium 2.8. Gave oral potassium. Patient noted to be hypertensive with systolics in the [id number]. She reports not being compliant with her antihypertensives. HCTZ given here and will prescribe this to her. Instructed her to follow up with her PCP as soon as possible. He voices understanding and is agreeable with this plan.  Discussed with Dr. Reather Converse.  Gustavus Bryant, MD 06/28/15 2107  Elnora Morrison, MD 06/28/15 2157

## 2015-06-28 NOTE — ED Notes (Signed)
Pt. Left with all belongings and refused wheelchair. Discharge instructions were reviewed and all questions were answered.  

## 2015-07-20 ENCOUNTER — Ambulatory Visit (INDEPENDENT_AMBULATORY_CARE_PROVIDER_SITE_OTHER): Payer: Medicaid Other | Admitting: Obstetrics

## 2015-07-20 ENCOUNTER — Encounter: Payer: Self-pay | Admitting: Obstetrics

## 2015-07-20 VITALS — BP 157/91 | HR 93 | Temp 97.8°F | Ht 69.0 in | Wt 216.0 lb

## 2015-07-20 DIAGNOSIS — Z01419 Encounter for gynecological examination (general) (routine) without abnormal findings: Secondary | ICD-10-CM | POA: Diagnosis not present

## 2015-07-20 DIAGNOSIS — Z0000 Encounter for general adult medical examination without abnormal findings: Secondary | ICD-10-CM

## 2015-07-20 DIAGNOSIS — N939 Abnormal uterine and vaginal bleeding, unspecified: Secondary | ICD-10-CM

## 2015-07-20 NOTE — Patient Instructions (Signed)
Dysfunctional Uterine Bleeding Dysfunctional uterine bleeding is abnormal bleeding from the uterus. Dysfunctional uterine bleeding includes:  A period that comes earlier or later than usual.  A period that is lighter, heavier, or has blood clots.  Bleeding between periods.  Skipping one or more periods.  Bleeding after sexual intercourse.  Bleeding after menopause. HOME CARE INSTRUCTIONS  Pay attention to any changes in your symptoms. Follow these instructions to help with your condition: Eating  Eat well-balanced meals. Include foods that are high in iron, such as liver, meat, shellfish, green leafy vegetables, and eggs.  If you become constipated:  Drink plenty of water.  Eat fruits and vegetables that are high in water and fiber, such as spinach, carrots, raspberries, apples, and mango. Medicines  Take over-the-counter and prescription medicines only as told by your health care provider.  Do not change medicines without talking with your health care provider.  Aspirin or medicines that contain aspirin may make the bleeding worse. Do not take those medicines:  During the week before your period.  During your period.  If you were prescribed iron pills, take them as told by your health care provider. Iron pills help to replace iron that your body loses because of this condition. Activity  If you need to change your sanitary pad or tampon more than one time every 2 hours:  Lie in bed with your feet raised (elevated).  Place a cold pack on your lower abdomen.  Rest as much as possible until the bleeding stops or slows down.  Do not try to lose weight until the bleeding has stopped and your blood iron level is back to normal. Other Instructions  For two months, write down:  When your period starts.  When your period ends.  When any abnormal bleeding occurs.  What problems you notice.  Keep all follow up visits as told by your health care provider. This is  important. SEEK MEDICAL CARE IF:  You get light-headed or weak.  You have nausea and vomiting.  You cannot eat or drink without vomiting.  You feel dizzy or have diarrhea while you are taking medicines.  You are taking birth control pills or hormones, and you want to change them or stop taking them. SEEK IMMEDIATE MEDICAL CARE IF:  You develop a fever or chills.  You need to change your sanitary pad or tampon more than one time per hour.  Your bleeding becomes heavier, or your flow contains clots more often.  You develop pain in your abdomen.  You lose consciousness.  You develop a rash.   This information is not intended to replace advice given to you by your health care provider. Make sure you discuss any questions you have with your health care provider.   Document Released: 07/22/2000 Document Revised: 04/15/2015 Document Reviewed: 10/20/2014 Elsevier Interactive Patient Education 2016 [employer]. Menorrhagia Menorrhagia is the medical term for when your menstrual periods are heavy or last longer than usual. With menorrhagia, every period you have may cause enough blood loss and cramping that you are unable to maintain your usual activities. CAUSES  In some cases, the cause of heavy periods is unknown, but a number of conditions may cause menorrhagia. Common causes include:  A problem with the hormone-producing thyroid gland (hypothyroid).  Noncancerous growths in the uterus (polyps or fibroids).  An imbalance of the estrogen and progesterone hormones.  One of your ovaries not releasing an egg during one or more months.  Side effects of having an  intrauterine device (IUD).  Side effects of some medicines, such as anti-inflammatory medicines or blood thinners.  A bleeding disorder that stops your blood from clotting normally. SIGNS AND SYMPTOMS  During a normal period, bleeding lasts between 4 and 8 days. Signs that your periods are too heavy include:  You  routinely have to change your pad or tampon every 1 or 2 hours because it is completely soaked.  You pass blood clots larger than 1 inch (2.5 cm) in size.  You have bleeding for more than 7 days.  You need to use pads and tampons at the same time because of heavy bleeding.  You need to wake up to change your pads or tampons during the night.  You have symptoms of anemia, such as tiredness, fatigue, or shortness of breath. DIAGNOSIS  Your health care provider will perform a physical exam and ask you questions about your symptoms and menstrual history. Other tests may be ordered based on what the health care provider finds during the exam. These tests can include:  Blood tests. Blood tests are used to check if you are pregnant or have hormonal changes, a bleeding or thyroid disorder, low iron levels (anemia), or other problems.  Endometrial biopsy. Your health care provider takes a sample of tissue from the inside of your uterus to be examined under a microscope.  Pelvic ultrasound. This test uses sound waves to make a picture of your uterus, ovaries, and vagina. The pictures can show if you have fibroids or other growths.  Hysteroscopy. For this test, your health care provider will use a small telescope to look inside your uterus. Based on the results of your initial tests, your health care provider may recommend further testing. TREATMENT  Treatment may not be needed. If it is needed, your health care provider may recommend treatment with one or more medicines first. If these do not reduce bleeding enough, a surgical treatment might be an option. The best treatment for you will depend on:   Whether you need to prevent pregnancy.  Your desire to have children in the future.  The cause and severity of your bleeding.  Your opinion and personal preference.  Medicines for menorrhagia may include:  Birth control methods that use hormones. These include the pill, skin patch, vaginal  ring, shots that you get every 3 months, hormonal IUD, and implant. These treatments reduce bleeding during your menstrual period.  Medicines that thicken blood and slow bleeding.  Medicines that reduce swelling, such as ibuprofen.  Medicines that contain a synthetic hormone called progestin.   Medicines that make the ovaries stop working for a short time.  You may need surgical treatment for menorrhagia if the medicines are unsuccessful. Treatment options include:  Dilation and curettage (D&C). In this procedure, your health care provider opens (dilates) your cervix and then scrapes or suctions tissue from the lining of your uterus to reduce menstrual bleeding.  Operative hysteroscopy. This procedure uses a tiny tube with a light (hysteroscope) to view your uterine cavity and can help in the surgical removal of a polyp that may be causing heavy periods.  Endometrial ablation. Through various techniques, your health care provider permanently destroys the entire lining of your uterus (endometrium). After endometrial ablation, most women have little or no menstrual flow. Endometrial ablation reduces your ability to become pregnant.  Endometrial resection. This surgical procedure uses an electrosurgical wire loop to remove the lining of the uterus. This procedure also reduces your ability to become pregnant.  Hysterectomy. Surgical removal of the uterus and cervix is a permanent procedure that stops menstrual periods. Pregnancy is not possible after a hysterectomy. This procedure requires anesthesia and hospitalization. HOME CARE INSTRUCTIONS   Only take over-the-counter or prescription medicines as directed by your health care provider. Take prescribed medicines exactly as directed. Do not change or switch medicines without consulting your health care provider.  Take any prescribed iron pills exactly as directed by your health care provider. Long-term heavy bleeding may result in low iron  levels. Iron pills help replace the iron your body lost from heavy bleeding. Iron may cause constipation. If this becomes a problem, increase the bran, fruits, and roughage in your diet.  Do not take aspirin or medicines that contain aspirin 1 week before or during your menstrual period. Aspirin may make the bleeding worse.  If you need to change your sanitary pad or tampon more than once every 2 hours, stay in bed and rest as much as possible until the bleeding stops.  Eat well-balanced meals. Eat foods high in iron. Examples are leafy green vegetables, meat, liver, eggs, and whole grain breads and cereals. Do not try to lose weight until the abnormal bleeding has stopped and your blood iron level is back to normal. SEEK MEDICAL CARE IF:   You soak through a pad or tampon every 1 or 2 hours, and this happens every time you have a period.  You need to use pads and tampons at the same time because you are bleeding so much.  You need to change your pad or tampon during the night.  You have a period that lasts for more than 8 days.  You pass clots bigger than 1 inch wide.  You have irregular periods that happen more or less often than once a month.  You feel dizzy or faint.  You feel very weak or tired.  You feel short of breath or feel your heart is beating too fast when you exercise.  You have nausea and vomiting or diarrhea while you are taking your medicine.  You have any problems that may be related to the medicine you are taking. SEEK IMMEDIATE MEDICAL CARE IF:   You soak through 4 or more pads or tampons in 2 hours.  You have any bleeding while you are pregnant. MAKE SURE YOU:   Understand these instructions.  Will watch your condition.  Will get help right away if you are not doing well or get worse.   This information is not intended to replace advice given to you by your health care provider. Make sure you discuss any questions you have with your health care  provider.   Document Released: 07/25/2005 Document Revised: 07/30/2013 Document Reviewed: 01/13/2013 Elsevier Interactive Patient Education [employer].

## 2015-07-20 NOTE — Progress Notes (Signed)
Patient ID: [name], female   DOB: [birth year], 44 y.o.   MRN: [id number]  Chief Complaint  Patient presents with  . Menstrual Problem    Patient states she is having prolonged menstrual cycles. Patients states they are extremely heavy.     HPI [name] is a 44 y.o. female.  Prolonged, heavy periods for past few months.  HPI  Past Medical History  Diagnosis Date  . Hypertension   . Diabetes mellitus   . Arthritis   . Pseudotumor cerebri     Past Surgical History  Procedure Laterality Date  . Achilles tendon repair      Family History  Problem Relation Age of Onset  . Asthma Other   . Hyperlipidemia Other   . Hypertension Other   . Cancer Other   . Hypertension Father   . Kidney disease Father     Social History Social History  Substance Use Topics  . Smoking status: Current Every Day Smoker -- 0.50 packs/day  . Smokeless tobacco: None  . Alcohol Use: 0.0 oz/week    0 Standard drinks or equivalent per week     Comment: Occassional     No Known Allergies  Current Outpatient Prescriptions  Medication Sig Dispense Refill  . amLODipine (NORVASC) 10 MG tablet Take 10 mg by mouth daily.    Marland Kitchen glipiZIDE (GLUCOTROL) 10 MG tablet Take 10 mg by mouth daily before breakfast.    . lisinopril-hydrochlorothiazide (PRINZIDE,ZESTORETIC) 20-12.5 MG tablet Take 1 tablet by mouth daily.    . metFORMIN (GLUCOPHAGE) 1000 MG tablet Take 1,000 mg by mouth 2 (two) times daily with a meal.     No current facility-administered medications for this visit.    Review of Systems Review of Systems Constitutional: negative for fatigue and weight loss Respiratory: negative for cough and wheezing Cardiovascular: negative for chest pain, fatigue and palpitations Gastrointestinal: negative for abdominal pain and change in bowel habits Genitourinary:positive for prolongrd, heavy periods Integument/breast: negative for nipple discharge Musculoskeletal:negative for  myalgias Neurological: negative for gait problems and tremors Behavioral/Psych: negative for abusive relationship, depression Endocrine: negative for temperature intolerance     Blood pressure 157/91, pulse 93, temperature 97.8 F (36.6 C), height 5\' 9"  (1.753 m), weight 216 lb (97.977 kg), last menstrual period 06/17/2015.  Physical Exam Physical Exam General:   alert  Skin:   no rash or abnormalities  Lungs:   clear to auscultation bilaterally  Heart:   regular rate and rhythm, S1, S2 normal, no murmur, click, rub or gallop  Breasts:   normal without suspicious masses, skin or nipple changes or axillary nodes  Abdomen:  normal findings: no organomegaly, soft, non-tender and no hernia  Pelvis:  External genitalia: normal general appearance Urinary system: urethral meatus normal and bladder without fullness, nontender Vaginal: normal without tenderness, induration or masses Cervix: normal appearance Adnexa: normal bimanual exam Uterus: anteverted and non-tender, normal size      Data Reviewed Labs  Assessment     AUB     Plan    Ultrasound ordered F/U 2 weeks.   No orders of the defined types were placed in this encounter.   Meds ordered this encounter  Medications  . metFORMIN (GLUCOPHAGE) 1000 MG tablet    Sig: Take 1,000 mg by mouth 2 (two) times daily with a meal.  . amLODipine (NORVASC) 10 MG tablet    Sig: Take 10 mg by mouth daily.  Marland Kitchen glipiZIDE (GLUCOTROL) 10 MG tablet    Sig:  Take 10 mg by mouth daily before breakfast.  . lisinopril-hydrochlorothiazide (PRINZIDE,ZESTORETIC) 20-12.5 MG tablet    Sig: Take 1 tablet by mouth daily.

## 2015-07-22 LAB — PAP, TP IMAGING W/ HPV RNA, RFLX HPV TYPE 16,18/45: HPV mRNA, High Risk: NOT DETECTED

## 2015-07-24 ENCOUNTER — Other Ambulatory Visit: Payer: Self-pay | Admitting: Obstetrics

## 2015-07-24 ENCOUNTER — Ambulatory Visit (HOSPITAL_COMMUNITY)
Admission: RE | Admit: 2015-07-24 | Discharge: 2015-07-24 | Disposition: A | Discharge status: Home / Self Care | Payer: Medicaid Other | Source: Ambulatory Visit | Attending: Obstetrics | Admitting: Obstetrics

## 2015-07-24 DIAGNOSIS — N939 Abnormal uterine and vaginal bleeding, unspecified: Secondary | ICD-10-CM | POA: Diagnosis present

## 2015-07-24 DIAGNOSIS — B9689 Other specified bacterial agents as the cause of diseases classified elsewhere: Secondary | ICD-10-CM

## 2015-07-24 DIAGNOSIS — N760 Acute vaginitis: Secondary | ICD-10-CM

## 2015-07-24 DIAGNOSIS — A5901 Trichomonal vulvovaginitis: Secondary | ICD-10-CM

## 2015-07-24 LAB — SURESWAB, VAGINOSIS/VAGINITIS PLUS
Atopobium vaginae: 7.7 Log (cells/mL)
C. albicans, DNA: NOT DETECTED
C. glabrata, DNA: NOT DETECTED
C. parapsilosis, DNA: NOT DETECTED
C. trachomatis RNA, TMA: NOT DETECTED
C. tropicalis, DNA: NOT DETECTED
Gardnerella vaginalis: >8 Log (cells/mL)
LACTOBACILLUS SPECIES: NOT DETECTED Log (cells/mL)
MEGASPHAERA SPECIES: >8 Log (cells/mL)
N. gonorrhoeae RNA, TMA: NOT DETECTED
T. vaginalis RNA, QL TMA: DETECTED — AB

## 2015-07-24 MED ORDER — TINIDAZOLE 500 MG PO TABS: 2.0000 g | ORAL_TABLET | Freq: Every day | ORAL | Status: DC

## 2015-08-04 ENCOUNTER — Ambulatory Visit (INDEPENDENT_AMBULATORY_CARE_PROVIDER_SITE_OTHER): Payer: Medicaid Other | Admitting: Obstetrics

## 2015-08-04 ENCOUNTER — Encounter: Payer: Self-pay | Admitting: Obstetrics

## 2015-08-04 VITALS — BP 221/129 | HR 101 | Temp 97.9°F | Resp 18 | Wt 220.0 lb

## 2015-08-04 DIAGNOSIS — N939 Abnormal uterine and vaginal bleeding, unspecified: Secondary | ICD-10-CM | POA: Diagnosis not present

## 2015-08-04 DIAGNOSIS — N946 Dysmenorrhea, unspecified: Secondary | ICD-10-CM

## 2015-08-04 MED ORDER — IBUPROFEN 800 MG PO TABS: 800.0000 mg | ORAL_TABLET | Freq: Three times a day (TID) | ORAL | Status: DC | PRN

## 2015-08-04 NOTE — Progress Notes (Signed)
Patient ID: [name], female   DOB: 19-Jun-1971, 44 y.o.   MRN: [id number]  No chief complaint on file.   HPI [name] is a 44 y.o. female.  Presents for ultrasound results and management plans for AUB. HPI  Past Medical History  Diagnosis Date  . Hypertension   . Diabetes mellitus   . Arthritis   . Pseudotumor cerebri     Past Surgical History  Procedure Laterality Date  . Achilles tendon repair      Family History  Problem Relation Age of Onset  . Asthma Other   . Hyperlipidemia Other   . Hypertension Other   . Cancer Other   . Hypertension Father   . Kidney disease Father     Social History Social History  Substance Use Topics  . Smoking status: Current Every Day Smoker -- 0.50 packs/day  . Smokeless tobacco: None  . Alcohol Use: 0.0 oz/week    0 Standard drinks or equivalent per week     Comment: Occassional     No Known Allergies  Current Outpatient Prescriptions  Medication Sig Dispense Refill  . amLODipine (NORVASC) 10 MG tablet Take 10 mg by mouth daily.    Marland Kitchen glipiZIDE (GLUCOTROL) 10 MG tablet Take 10 mg by mouth daily before breakfast.    . ibuprofen (ADVIL,MOTRIN) 800 MG tablet Take 1 tablet (800 mg total) by mouth every 8 (eight) hours as needed. 30 tablet 5  . lisinopril-hydrochlorothiazide (PRINZIDE,ZESTORETIC) 20-12.5 MG tablet Take 1 tablet by mouth daily.    . metFORMIN (GLUCOPHAGE) 1000 MG tablet Take 1,000 mg by mouth 2 (two) times daily with a meal.    . tinidazole (TINDAMAX) 500 MG tablet Take 4 tablets (2,000 mg total) by mouth daily with breakfast. 8 tablet 1   No current facility-administered medications for this visit.    Review of Systems Review of Systems Constitutional: negative for fatigue and weight loss Respiratory: negative for cough and wheezing Cardiovascular: negative for chest pain, fatigue and palpitations Gastrointestinal: negative for abdominal pain and change in bowel habits Genitourinary: positive for  heavy, prolonged and painful periods Integument/breast: negative for nipple discharge Musculoskeletal:negative for myalgias Neurological: negative for gait problems and tremors Behavioral/Psych: negative for abusive relationship, depression Endocrine: negative for temperature intolerance     Blood pressure 221/129, pulse 101, temperature 97.9 F (36.6 C), resp. rate 18, weight 220 lb (99.791 kg), last menstrual period 07/29/2015.  Physical Exam Physical Exam: Deferred  100% of 10 min visit spent on counseling and coordination of care.   Data Reviewed Ultrasound:  WNL's  Assessment     AUB Dysmenorrhea     Plan    F/U 2 weeks for endometrial Biopsy Ibuprofen Rx   No orders of the defined types were placed in this encounter.   Meds ordered this encounter  Medications  . ibuprofen (ADVIL,MOTRIN) 800 MG tablet    Sig: Take 1 tablet (800 mg total) by mouth every 8 (eight) hours as needed.    Dispense:  30 tablet    Refill:  5

## 2015-08-12 ENCOUNTER — Telehealth: Payer: Self-pay | Admitting: *Deleted

## 2015-08-12 NOTE — Telephone Encounter (Signed)
Patient returned call to the office. Patient has been scheduled for her biopsy on 08-19-15 @ 2:15 pm.

## 2015-08-12 NOTE — Telephone Encounter (Signed)
Patient contacted the office in regards to scheduling her endometrial biopsy.  Attempted to contact the patient to schedule to schedule the appointment. Left message for patient to contact the office.

## 2015-08-19 ENCOUNTER — Other Ambulatory Visit: Payer: Self-pay | Admitting: Obstetrics

## 2015-08-19 ENCOUNTER — Ambulatory Visit (INDEPENDENT_AMBULATORY_CARE_PROVIDER_SITE_OTHER): Payer: Medicaid Other | Admitting: Obstetrics

## 2015-08-19 VITALS — Wt 218.8 lb

## 2015-08-19 DIAGNOSIS — Z3202 Encounter for pregnancy test, result negative: Secondary | ICD-10-CM | POA: Diagnosis not present

## 2015-08-19 DIAGNOSIS — N939 Abnormal uterine and vaginal bleeding, unspecified: Secondary | ICD-10-CM | POA: Diagnosis not present

## 2015-08-19 DIAGNOSIS — Z01818 Encounter for other preprocedural examination: Secondary | ICD-10-CM

## 2015-08-19 LAB — POCT URINE PREGNANCY: Preg Test, Ur: NEGATIVE

## 2015-08-19 NOTE — Progress Notes (Signed)
Endometrial Biopsy Procedure Note  Pre-operative Diagnosis: AUB  Post-operative Diagnosis: same  Indications: abnormal uterine bleeding  Procedure Details   Urine pregnancy test was done in office and result was negative.  The risks (including infection, bleeding, pain, and uterine perforation) and benefits of the procedure were explained to the patient and Written informed consent was obtained.    The patient was placed in the dorsal lithotomy position.  Bimanual exam showed the uterus to be in the neutral position.  A Graves' speculum inserted in the vagina, and the cervix prepped with povidone iodine.  Endocervical curettage with a Kevorkian curette was not performed.   A sharp tenaculum was applied to the anterior lip of the cervix for stabilization.  A sterile uterine sound was used to sound the uterus to a depth of 9cm.  A Pipelle endometrial aspirator was used to sample the endometrium.  Sample was sent for pathologic examination.  Condition: Stable  Complications: None  Plan:  The patient was advised to call for any fever or for prolonged or severe pain or bleeding. She was advised to use NSAID as needed for mild to moderate pain. She was advised to avoid vaginal intercourse for 48 hours or until the bleeding has completely stopped.  Attending Physician Documentation: I was present for or participated in the entire procedure, including opening and closing.

## 2015-09-01 ENCOUNTER — Encounter: Payer: Self-pay | Admitting: Obstetrics

## 2015-09-01 ENCOUNTER — Ambulatory Visit (INDEPENDENT_AMBULATORY_CARE_PROVIDER_SITE_OTHER): Payer: Medicaid Other | Admitting: Obstetrics

## 2015-09-01 VITALS — BP 183/102 | HR 91 | Temp 97.5°F | Wt 222.0 lb

## 2015-09-01 DIAGNOSIS — N939 Abnormal uterine and vaginal bleeding, unspecified: Secondary | ICD-10-CM | POA: Diagnosis not present

## 2015-09-01 DIAGNOSIS — N946 Dysmenorrhea, unspecified: Secondary | ICD-10-CM | POA: Diagnosis not present

## 2015-09-01 MED ORDER — MEDROXYPROGESTERONE ACETATE 10 MG PO TABS: 10.0000 mg | ORAL_TABLET | Freq: Every day | ORAL | Status: DC

## 2015-09-01 MED ORDER — VITAFOL GUMMIES 3.33-0.333-34.8 MG PO CHEW: 1.0000 | CHEWABLE_TABLET | Freq: Every day | ORAL | Status: DC

## 2015-09-01 NOTE — Progress Notes (Signed)
Patient ID: [name], female   DOB: [birth year], 45 y.o.   MRN: [id number]  Chief Complaint  Patient presents with  . Follow-up    Endometrial Biopsy Results    HPI [name] is a 45 y.o. female.  Presents for results of Endometrial Biopsy.  HPI  Past Medical History  Diagnosis Date  . Hypertension   . Diabetes mellitus   . Arthritis   . Pseudotumor cerebri     Past Surgical History  Procedure Laterality Date  . Achilles tendon repair      Family History  Problem Relation Age of Onset  . Asthma Other   . Hyperlipidemia Other   . Hypertension Other   . Cancer Other   . Hypertension Father   . Kidney disease Father     Social History Social History  Substance Use Topics  . Smoking status: Former Smoker -- 0.50 packs/day  . Smokeless tobacco: Former Systems developer    Quit date: 08/28/2015  . Alcohol Use: 0.0 oz/week    0 Standard drinks or equivalent per week     Comment: Occassional     No Known Allergies  Current Outpatient Prescriptions  Medication Sig Dispense Refill  . amLODipine (NORVASC) 10 MG tablet Take 10 mg by mouth daily.    Marland Kitchen glipiZIDE (GLUCOTROL) 10 MG tablet Take 10 mg by mouth daily before breakfast.    . ibuprofen (ADVIL,MOTRIN) 800 MG tablet Take 1 tablet (800 mg total) by mouth every 8 (eight) hours as needed. (Patient not taking: Reported on 08/19/2015) 30 tablet 5  . lisinopril-hydrochlorothiazide (PRINZIDE,ZESTORETIC) 20-12.5 MG tablet Take 1 tablet by mouth daily. Reported on 09/01/2015    . medroxyPROGESTERone (PROVERA) 10 MG tablet Take 1 tablet (10 mg total) by mouth daily. Starting on the 1st of the month for 10 days each month x 3 30 tablet 0  . Prenatal Vit-Fe Phos-FA-Omega (VITAFOL GUMMIES) 3.33-0.333-34.8 MG CHEW Chew 1 tablet by mouth daily before breakfast. 30 tablet 11   No current facility-administered medications for this visit.    Review of Systems Review of Systems Constitutional: negative for fatigue and weight  loss Respiratory: negative for cough and wheezing Cardiovascular: negative for chest pain, fatigue and palpitations Gastrointestinal: negative for abdominal pain and change in bowel habits Genitourinary: positive for heavy periods, prolonged Integument/breast: negative for nipple discharge Musculoskeletal:negative for myalgias Neurological: negative for gait problems and tremors Behavioral/Psych: negative for abusive relationship, depression Endocrine: negative for temperature intolerance     Blood pressure 183/102, pulse 91, temperature 97.5 F (36.4 C), weight 222 lb (100.699 kg), last menstrual period 08/21/2015.  Physical Exam Physical Exam:  Deferred  100% of 10 min visit spent on counseling and coordination of care.   Data Reviewed Labs Pathology  Assessment     AUB.  Normal Endometrial Biopsy.  Dysmenorrhea    Plan    Provera challenge x 3. Ibuprofen Rx F/U 4 months  No orders of the defined types were placed in this encounter.   Meds ordered this encounter  Medications  . medroxyPROGESTERone (PROVERA) 10 MG tablet    Sig: Take 1 tablet (10 mg total) by mouth daily. Starting on the 1st of the month for 10 days each month x 3    Dispense:  30 tablet    Refill:  0  . Prenatal Vit-Fe Phos-FA-Omega (VITAFOL GUMMIES) 3.33-0.333-34.8 MG CHEW    Sig: Chew 1 tablet by mouth daily before breakfast.    Dispense:  30 tablet  Refill:  11

## 2015-11-13 ENCOUNTER — Telehealth: Payer: Self-pay | Admitting: *Deleted

## 2015-11-13 NOTE — Telephone Encounter (Signed)
Patinet requested call back. 10:30 Call to patient- she states she is having what she thinks relates to pregnancy symptoms- but she is not pregnant. She is having nausea and vomiting, fatigue, breast tenderness. She is cycling on the Provera for 3 months and is on her last month. She has a follow up next month. She has not been sexually active ina while and has ahd regular bleeding with the Provera. Explained to patient she may be having side effects from the hormone. Will speak to her provider about her symptoms.

## 2015-12-30 ENCOUNTER — Ambulatory Visit (INDEPENDENT_AMBULATORY_CARE_PROVIDER_SITE_OTHER): Payer: Medicaid Other | Admitting: Obstetrics

## 2016-01-01 ENCOUNTER — Ambulatory Visit (INDEPENDENT_AMBULATORY_CARE_PROVIDER_SITE_OTHER): Payer: Medicaid Other | Admitting: Obstetrics

## 2016-01-01 ENCOUNTER — Encounter: Payer: Self-pay | Admitting: Obstetrics

## 2016-01-01 VITALS — BP 200/97 | HR 84 | Wt 214.0 lb

## 2016-01-01 DIAGNOSIS — N946 Dysmenorrhea, unspecified: Secondary | ICD-10-CM

## 2016-01-01 DIAGNOSIS — N939 Abnormal uterine and vaginal bleeding, unspecified: Secondary | ICD-10-CM

## 2016-01-01 MED ORDER — OXYCODONE HCL 10 MG PO TABS
10.0000 mg | ORAL_TABLET | Freq: Four times a day (QID) | ORAL | Status: DC | PRN
Start: 2016-01-01 — End: 2016-10-06

## 2016-01-01 NOTE — Progress Notes (Signed)
Patient ID: [name], female   DOB: 10/05/1970, 45 y.o.   MRN: [id number]  Chief Complaint  Patient presents with  . Follow-up    DUB    HPI [name] is a 45 y.o. female.  Heavy and painful periods.  Improved after 3 months of Provera day 1-10 challenge.  Now considering other treatment options.  Does not desire future fertility.  Has had tubal sterilization. HPI  Past Medical History  Diagnosis Date  . Hypertension   . Diabetes mellitus   . Arthritis   . Pseudotumor cerebri     Past Surgical History  Procedure Laterality Date  . Achilles tendon repair      Family History  Problem Relation Age of Onset  . Asthma Other   . Hyperlipidemia Other   . Hypertension Other   . Cancer Other   . Hypertension Father   . Kidney disease Father     Social History Social History  Substance Use Topics  . Smoking status: Former Smoker -- 0.50 packs/day  . Smokeless tobacco: Former Systems developer    Quit date: 08/28/2015  . Alcohol Use: 0.0 oz/week    0 Standard drinks or equivalent per week     Comment: Occassional     No Known Allergies  Current Outpatient Prescriptions  Medication Sig Dispense Refill  . amLODipine (NORVASC) 10 MG tablet Take 10 mg by mouth daily. Reported on 01/01/2016    . glipiZIDE (GLUCOTROL) 10 MG tablet Take 10 mg by mouth daily before breakfast. Reported on 01/01/2016    . ibuprofen (ADVIL,MOTRIN) 800 MG tablet Take 1 tablet (800 mg total) by mouth every 8 (eight) hours as needed. (Patient not taking: Reported on 08/19/2015) 30 tablet 5  . lisinopril-hydrochlorothiazide (PRINZIDE,ZESTORETIC) 20-12.5 MG tablet Take 1 tablet by mouth daily. Reported on 01/01/2016    . medroxyPROGESTERone (PROVERA) 10 MG tablet Take 1 tablet (10 mg total) by mouth daily. Starting on the 1st of the month for 10 days each month x 3 (Patient not taking: Reported on 01/01/2016) 30 tablet 0  . Oxycodone HCl 10 MG TABS Take 1 tablet (10 mg total) by mouth every 6 (six) hours as  needed. 40 tablet 0  . Prenatal Vit-Fe Phos-FA-Omega (VITAFOL GUMMIES) 3.33-0.333-34.8 MG CHEW Chew 1 tablet by mouth daily before breakfast. (Patient not taking: Reported on 01/01/2016) 30 tablet 11   No current facility-administered medications for this visit.    Review of Systems Review of Systems Constitutional: negative for fatigue and weight loss Respiratory: negative for cough and wheezing Cardiovascular: negative for chest pain, fatigue and palpitations Gastrointestinal: negative for abdominal pain and change in bowel habits Genitourinary:positive for heavy, painful and prolonged periods Integument/breast: negative for nipple discharge Musculoskeletal:negative for myalgias Neurological: negative for gait problems and tremors Behavioral/Psych: negative for abusive relationship, depression Endocrine: negative for temperature intolerance     Blood pressure 200/97, pulse 84, weight 214 lb (97.07 kg), last menstrual period 01/01/2016.  Physical Exam Physical Exam:  Deferred  100% of 10 min visit spent on counseling and coordination of care.   Data Reviewed Labs Pathology  Assessment     AUB Dysmenorrhea     Plan    Considering HTA F/U in 3 months   No orders of the defined types were placed in this encounter.   Meds ordered this encounter  Medications  . Oxycodone HCl 10 MG TABS    Sig: Take 1 tablet (10 mg total) by mouth every 6 (six) hours as needed.  Dispense:  40 tablet    Refill:  0

## 2016-01-05 ENCOUNTER — Encounter: Payer: Self-pay | Admitting: Obstetrics

## 2016-04-04 ENCOUNTER — Encounter: Payer: Self-pay | Admitting: Obstetrics

## 2016-04-04 ENCOUNTER — Ambulatory Visit (INDEPENDENT_AMBULATORY_CARE_PROVIDER_SITE_OTHER): Payer: Medicaid Other | Admitting: Obstetrics

## 2016-04-04 VITALS — BP 207/123 | HR 82 | Temp 98.6°F | Wt 216.0 lb

## 2016-04-04 DIAGNOSIS — N946 Dysmenorrhea, unspecified: Secondary | ICD-10-CM

## 2016-04-04 DIAGNOSIS — N939 Abnormal uterine and vaginal bleeding, unspecified: Secondary | ICD-10-CM

## 2016-04-04 NOTE — Progress Notes (Signed)
Patient ID: [name], female   DOB: 05-28-1971, 45 y.o.   MRN: [id number] Patient ID: [name], female   DOB: May 12, 1971, 45 y.o.   MRN: [id number]  Chief Complaint  Patient presents with  . Gynecologic Exam    Patient reports the Provera worked to get the bleeding under control. She just uses the pain medication the first couple days of her cycle and then she is good. She is doing well.    HPI [name] is a 45 y.o. female.  H/O AUB and dysmenorrhea.  Much improved after Provera. HPI  Past Medical History:  Diagnosis Date  . Arthritis   . Diabetes mellitus   . Hypertension   . Pseudotumor cerebri     Past Surgical History:  Procedure Laterality Date  . ACHILLES TENDON REPAIR      Family History  Problem Relation Age of Onset  . Asthma Other   . Hyperlipidemia Other   . Hypertension Other   . Cancer Other   . Hypertension Father   . Kidney disease Father     Social History Social History  Substance Use Topics  . Smoking status: Former Smoker    Packs/day: 0.50  . Smokeless tobacco: Former Systems developer    Quit date: 03/31/2016  . Alcohol use 0.0 oz/week     Comment: Occassional     No Known Allergies  Current Outpatient Prescriptions  Medication Sig Dispense Refill  . Oxycodone HCl 10 MG TABS Take 1 tablet (10 mg total) by mouth every 6 (six) hours as needed. 40 tablet 0   No current facility-administered medications for this visit.     Review of Systems Review of Systems Constitutional: negative for fatigue and weight loss Respiratory: negative for cough and wheezing Cardiovascular: negative for chest pain, fatigue and palpitations Gastrointestinal: negative for abdominal pain and change in bowel habits Genitourinary:negative Integument/breast: negative for nipple discharge Musculoskeletal:negative for myalgias Neurological: negative for gait problems and tremors Behavioral/Psych: negative for abusive relationship, depression Endocrine:  negative for temperature intolerance     Blood pressure (!) 207/123, pulse 82, temperature 98.6 F (37 C), weight 216 lb (98 kg), last menstrual period 03/05/2016.  Physical Exam Physical Exam:  Deferred >50%% of 10 min visit spent on counseling and coordination of care.   Data Reviewed Labs  Assessment     AUB Dysmenorrhea Hypertention    Plan    Continue Provera prn Patient to see PCP at 1100 today for BP management F/U 4 months for pap smear.  No orders of the defined types were placed in this encounter.  No orders of the defined types were placed in this encounter.

## 2016-06-28 ENCOUNTER — Other Ambulatory Visit: Payer: Self-pay | Admitting: Obstetrics

## 2016-06-28 DIAGNOSIS — N939 Abnormal uterine and vaginal bleeding, unspecified: Secondary | ICD-10-CM

## 2016-06-28 DIAGNOSIS — N946 Dysmenorrhea, unspecified: Secondary | ICD-10-CM

## 2016-06-29 ENCOUNTER — Telehealth: Payer: Self-pay | Admitting: *Deleted

## 2016-06-29 NOTE — Telephone Encounter (Signed)
Patient called requesting refills of the Oxycodone and Ibuprofen for her cycle- she is cramping very badly.  12:12 Call to patient- informed patient her provider is not in the office and will not be in until Monday. Will leave message for him- continue the Ibuprofen. Patient understands.

## 2016-07-04 NOTE — Telephone Encounter (Signed)
Can only Rx NSAID.

## 2016-07-28 ENCOUNTER — Ambulatory Visit: Payer: Self-pay | Admitting: Obstetrics

## 2016-10-04 ENCOUNTER — Ambulatory Visit: Payer: Medicaid Other | Admitting: Podiatry

## 2016-10-05 ENCOUNTER — Other Ambulatory Visit: Payer: Self-pay | Admitting: *Deleted

## 2016-10-06 ENCOUNTER — Telehealth: Payer: Self-pay | Admitting: *Deleted

## 2016-10-06 ENCOUNTER — Ambulatory Visit (INDEPENDENT_AMBULATORY_CARE_PROVIDER_SITE_OTHER): Payer: Medicaid Other

## 2016-10-06 ENCOUNTER — Ambulatory Visit (INDEPENDENT_AMBULATORY_CARE_PROVIDER_SITE_OTHER): Payer: Medicaid Other | Admitting: Podiatry

## 2016-10-06 ENCOUNTER — Encounter: Payer: Self-pay | Admitting: Podiatry

## 2016-10-06 DIAGNOSIS — B351 Tinea unguium: Secondary | ICD-10-CM | POA: Diagnosis not present

## 2016-10-06 DIAGNOSIS — E1142 Type 2 diabetes mellitus with diabetic polyneuropathy: Secondary | ICD-10-CM | POA: Diagnosis not present

## 2016-10-06 DIAGNOSIS — I10 Essential (primary) hypertension: Secondary | ICD-10-CM | POA: Diagnosis not present

## 2016-10-06 DIAGNOSIS — S92501A Displaced unspecified fracture of right lesser toe(s), initial encounter for closed fracture: Secondary | ICD-10-CM

## 2016-10-06 NOTE — Telephone Encounter (Signed)
Pt states she had to return to her house to get her medicaid card and was about 5 miles out.

## 2016-10-06 NOTE — Progress Notes (Signed)
   Subjective:    Patient ID: [name], female    DOB: 11/21/1970, 47 y.o.   MRN: [id number]  HPI: This patient presents today with a chief concern of a swollen fifth digit of the right foot that she injured approximate 6 weeks ago states that it is dark and is still painful sometimes it is numb. She also relates some numbness and tingling to her feet and hands she has a history of diabetes and hypertension and is not taking any of her medications.    Review of Systems  Neurological: Positive for numbness.  All other systems reviewed and are negative.      Objective:   Physical Exam: Her blood pressure is through the roof left arm 226/134 and right arm 228/132 she is alert and oriented 3 pulses are palpable. Neurologic sensorium is slightly diminished deep tendon reflexes are intact muscle strength is normal. Orthopedic evaluation demonstrates painful fifth swollen toe with postinflammatory hyperpigmentation. Radiographs demonstrate no major osseous abnormalities to the bilateral foot she has fractured fifth digits bilaterally that appear to be healing.        Assessment & Plan:  Fractured fifth digit right foot. Severe hypertension. Diabetes.  Plan: I encouraged her to immediately go to the emergency room for this high blood pressure I explained to her that she was instructed to report or a an risk of kidney damage. She stated that she would do so. I told her that her fifth toe with take several months to feel normal and that that was the least of her worries at this point.

## 2017-05-11 ENCOUNTER — Encounter (HOSPITAL_BASED_OUTPATIENT_CLINIC_OR_DEPARTMENT_OTHER): Payer: Self-pay

## 2017-05-11 DIAGNOSIS — G4710 Hypersomnia, unspecified: Secondary | ICD-10-CM

## 2017-05-11 DIAGNOSIS — R0683 Snoring: Secondary | ICD-10-CM

## 2017-05-11 DIAGNOSIS — R0681 Apnea, not elsewhere classified: Secondary | ICD-10-CM

## 2017-07-03 ENCOUNTER — Ambulatory Visit (HOSPITAL_BASED_OUTPATIENT_CLINIC_OR_DEPARTMENT_OTHER): Payer: Medicaid Other | Attending: Internal Medicine | Admitting: Internal Medicine

## 2017-07-03 VITALS — Ht 69.0 in | Wt 212.0 lb

## 2017-07-03 DIAGNOSIS — R0683 Snoring: Secondary | ICD-10-CM | POA: Insufficient documentation

## 2017-07-03 DIAGNOSIS — Z6831 Body mass index (BMI) 31.0-31.9, adult: Secondary | ICD-10-CM | POA: Insufficient documentation

## 2017-07-03 DIAGNOSIS — E669 Obesity, unspecified: Secondary | ICD-10-CM | POA: Diagnosis not present

## 2017-07-03 DIAGNOSIS — G4719 Other hypersomnia: Secondary | ICD-10-CM | POA: Insufficient documentation

## 2017-07-03 DIAGNOSIS — G4730 Sleep apnea, unspecified: Secondary | ICD-10-CM | POA: Diagnosis present

## 2017-07-03 DIAGNOSIS — G4733 Obstructive sleep apnea (adult) (pediatric): Secondary | ICD-10-CM | POA: Insufficient documentation

## 2017-07-03 DIAGNOSIS — I10 Essential (primary) hypertension: Secondary | ICD-10-CM | POA: Insufficient documentation

## 2017-07-03 DIAGNOSIS — E119 Type 2 diabetes mellitus without complications: Secondary | ICD-10-CM | POA: Diagnosis not present

## 2017-07-03 DIAGNOSIS — G4710 Hypersomnia, unspecified: Secondary | ICD-10-CM

## 2017-07-03 DIAGNOSIS — R0681 Apnea, not elsewhere classified: Secondary | ICD-10-CM

## 2017-07-09 DIAGNOSIS — R0683 Snoring: Secondary | ICD-10-CM

## 2017-07-09 NOTE — Procedures (Signed)
Patient Name: [name], [name] Date: 07/03/2017 Gender: Female D.O.B: 1970-10-20 Age (years): 45 Referring Provider: Doristine Section Bonsu Height (inches): 63 Interpreting Physician: Baird Lyons MD, ABSM Weight (lbs): 212 RPSGT: Carolin Coy BMI: 31 MRN: [id number] Neck Size: 14.50 CLINICAL INFORMATION Sleep Study Type: NPSG  Indication for sleep study: Diabetes, Excessive Daytime Sleepiness, Hypertension, Obesity, Snoring, Witnessed Apneas  Epworth Sleepiness Score: 11  SLEEP STUDY TECHNIQUE As per the AASM Manual for the Scoring of Sleep and Associated Events v2.3 (April 2016) with a hypopnea requiring 4% desaturations.  The channels recorded and monitored were frontal, central and occipital EEG, electrooculogram (EOG), submentalis EMG (chin), nasal and oral airflow, thoracic and abdominal wall motion, anterior tibialis EMG, snore microphone, electrocardiogram, and pulse oximetry.  MEDICATIONS Medications self-administered by patient taken the night of the study : none reported  SLEEP ARCHITECTURE The study was initiated at 10:30:36 PM and ended at 4:41:30 AM.  Sleep onset time was 3.4 minutes and the sleep efficiency was 87.9%. The total sleep time was 326.0 minutes.  Stage REM latency was 84.5 minutes.  The patient spent 13.34% of the night in stage N1 sleep, 69.94% in stage N2 sleep, 0.00% in stage N3 and 16.72% in REM.  Alpha intrusion was absent.  Supine sleep was 6.58%.  RESPIRATORY PARAMETERS The overall apnea/hypopnea index (AHI) was 10.1 per hour. There were 11 total apneas, including 1 obstructive, 8 central and 2 mixed apneas. There were 44 hypopneas and 27 RERAs.  The AHI during Stage REM sleep was 6.6 per hour.  AHI while supine was 78.3 per hour.  The mean oxygen saturation was 96.65%. The minimum SpO2 during sleep was 89.00%.  moderate snoring was noted during this study.  CARDIAC DATA The 2 lead EKG demonstrated sinus rhythm. The mean  heart rate was 78.65 beats per minute. Other EKG findings include: None.  LEG MOVEMENT DATA The total PLMS were 264 with a resulting PLMS index of 48.59. Associated arousal with leg movement index was 2.6 .  IMPRESSIONS - Mild obstructive sleep apnea occurred during this study (AHI = 10.1/h). - There were insufficient early events to meet protocol requirements for split CPAP titration. - No significant central sleep apnea occurred during this study (CAI = 1.5/h). - The patient had minimal or no oxygen desaturation during the study (Min O2 = 89.00%) - The patient snored with moderate snoring volume. - No cardiac abnormalities were noted during this study. - Moderate periodic limb movements of sleep occurred during the study. No significant associated arousals.  DIAGNOSIS - Obstructive Sleep Apnea (327.23 [G47.33 ICD-10])  RECOMMENDATIONS - Conservative management and observation may be sufficient. Other options, including CPAP, an oral appliance, or ENT evaluation might be considered, based on clinical judgment. - Positional therapy avoiding supine position during sleep. - Be careful with alcohol, sedatives and other CNS depressants that may worsen sleep apnea and disrupt normal sleep architecture. - Sleep hygiene should be reviewed to assess factors that may improve sleep quality. - Weight management and regular exercise should be initiated or continued if appropriate.  [Electronically signed] 07/09/2017 09:35 AM  Baird Lyons MD, ABSM Diplomate, American Board of Sleep Medicine   NPI: [id number]                          [place] [place] [facility]  ELECTRONICALLY SIGNED ON:  07/09/2017, 9:31 AM [place]: [phone]   FX: [phone] ACCREDITED  BY THE AMERICAN ACADEMY OF SLEEP MEDICINE

## 2017-08-25 ENCOUNTER — Encounter: Payer: Self-pay | Admitting: Internal Medicine

## 2017-08-25 ENCOUNTER — Ambulatory Visit: Payer: Medicaid Other | Admitting: Internal Medicine

## 2017-08-25 VITALS — HR 92 | Ht 69.0 in | Wt 219.0 lb

## 2017-08-25 DIAGNOSIS — G4733 Obstructive sleep apnea (adult) (pediatric): Secondary | ICD-10-CM | POA: Diagnosis not present

## 2017-08-25 NOTE — Progress Notes (Signed)
08/25/17-47 year old female smoker for sleep evaluation. Medical problem list includes arthritis, DM 2, HBP, pseudotumor cerebri NPSG - 07/03/17- AHI 10.1/ hr, desaturation to 89%, body weight 212 lbs ----Sleep Consult: Dr Vista Lawman. Pt had sleep study back in 07/08. Pt had CPAP through [facility] but machine broke and has not been using it for last 2 months.  Slept better with CPAP and feels "sluggish" without it.  Told of loud snoring.  Frequent waking at night.  No ENT surgery. Epworth score 13 [place] "sometimes".  Uses a Flovent inhaler as a rescue for diagnosed asthma.  No bronchodilator.  Denies heart disease.  Prior to Admission medications   Medication Sig Start Date End Date Taking? Authorizing Provider  amLODipine (NORVASC) 10 MG tablet Take 10 mg by mouth daily. 07/18/16  Yes [provider]  [employer] VITAMIN D3 1000 units capsule Take 1,000 Units by mouth daily. 07/18/16  Yes [provider]  gemfibrozil (LOPID) 600 MG tablet Take 600 mg by mouth 2 (two) times daily. 07/18/16  Yes [provider]  hydrALAZINE (APRESOLINE) 50 MG tablet Take 50 mg by mouth 3 (three) times daily. 07/18/16  Yes [provider]  ibuprofen (ADVIL,MOTRIN) 800 MG tablet TAKE 1 TABLET (800 MG TOTAL) BY MOUTH EVERY 8 (EIGHT) HOURS AS NEEDED. 06/29/16  Yes Shelly Bombard, MD  lisinopril-hydrochlorothiazide (PRINZIDE,ZESTORETIC) 20-12.5 MG tablet Take 2 tablets by mouth daily. 07/18/16  Yes [provider]  metFORMIN (GLUCOPHAGE) 1000 MG tablet Take 1,000 mg by mouth 2 (two) times daily. 07/18/16  Yes [provider]  NOVOLOG MIX 70/30 (70-30) 100 UNIT/ML injection INJECT 20 UNITS 2 TIMES DAILY SUBCUTANEOUSLY 07/09/16  Yes [provider]   Past Medical History:  Diagnosis Date  . Arthritis   . Diabetes mellitus   . Hypertension   . Pseudotumor cerebri    Past Surgical History:  Procedure Laterality Date  . ACHILLES TENDON REPAIR     Family History   Problem Relation Age of Onset  . Asthma Other   . Hyperlipidemia Other   . Hypertension Other   . Cancer Other   . Hypertension Father   . Kidney disease Father    Social History   Socioeconomic History  . Marital status: Single    Spouse name: Not on file  . Number of children: Not on file  . Years of education: Not on file  . Highest education level: Not on file  Social Needs  . Financial resource strain: Not on file  . Food insecurity - worry: Not on file  . Food insecurity - inability: Not on file  . Transportation needs - medical: Not on file  . Transportation needs - non-medical: Not on file  Occupational History  . Not on file  Tobacco Use  . Smoking status: Former Smoker    Packs/day: 0.50  . Smokeless tobacco: Former Systems developer    Quit date: 03/31/2016  Substance and Sexual Activity  . Alcohol use: Yes    Alcohol/week: 0.0 oz    Comment: Occassional   . Drug use: Yes    Types: Marijuana    Comment: Occassional   . Sexual activity: Yes    Partners: Male    Birth control/protection: Surgical  Other Topics Concern  . Not on file  Social History Narrative  . Not on file   ROS-see HPI   + = positive Constitutional:    weight loss, night sweats, fevers, chills,+ fatigue, lassitude. HEENT:    headaches, difficulty swallowing, tooth/dental problems, sore  throat,       sneezing, itching, ear ache, nasal congestion, post nasal drip, snoring CV:    chest pain, orthopnea, PND, swelling in lower extremities, anasarca,                                                  dizziness, palpitations Resp:   shortness of breath with exertion or at rest.                productive cough,   non-productive cough, coughing up of blood.              change in color of mucus.  wheezing.   Skin:    rash or lesions. GI:  No-   heartburn, indigestion, abdominal pain, nausea, vomiting, diarrhea,                 change in bowel habits, loss of appetite GU: dysuria, change in color of urine,  no urgency or frequency.   flank pain. MS:   joint pain, stiffness, decreased range of motion, back pain. Neuro-     nothing unusual Psych:  change in mood or affect.  depression or anxiety.   memory loss.  OBJ- Physical Exam General- Alert, Oriented, Affect-appropriate, Distress- none acute, her weight Skin- rash-none, lesions- none, excoriation- none  Lymphadenopathy- none Head- atraumatic            Eyes- Gross vision intact, PERRLA, conjunctivae and secretions clear            Ears- Hearing, canals-normal            Nose- Clear, no-Septal dev, mucus, polyps, erosion, perforation             Throat- Mallampati III , mucosa clear , drainage- none, tonsils- atrophic, + tongue stud Neck- flexible , trachea midline, no stridor , thyroid nl, carotid no bruit Chest - symmetrical excursion , unlabored           Heart/CV- RRR , no murmur , no gallop  , no rub, nl s1 s2                           - JVD- none , edema- none, stasis changes- none, varices- none           Lung- clear to P&A, wheeze- none, cough- none , dullness-none, rub- none           Chest wall-  Abd-  Br/ Gen/ Rectal- Not done, not indicated Extrem- cyanosis- none, clubbing, none, atrophy- none, strength- nl Neuro- grossly intact to observation

## 2017-08-25 NOTE — Patient Instructions (Signed)
Order- [facility]- replacement for old, broken CPAP machine, auto 5-15, mask of choice, humidifier, supplies, AirView  Dx OSA  Please call as needed

## 2017-08-26 DIAGNOSIS — G4733 Obstructive sleep apnea (adult) (pediatric): Secondary | ICD-10-CM | POA: Insufficient documentation

## 2017-08-26 NOTE — Assessment & Plan Note (Signed)
Tentative diagnosis based on history and physical appropriate education and discussion done. Plan-we will sleep study.  She will call for results anticipating we may start CPAP.

## 2017-11-24 ENCOUNTER — Ambulatory Visit: Payer: Medicaid Other | Admitting: Internal Medicine

## 2018-01-08 ENCOUNTER — Ambulatory Visit (INDEPENDENT_AMBULATORY_CARE_PROVIDER_SITE_OTHER): Payer: Medicaid Other | Admitting: Internal Medicine

## 2018-01-08 ENCOUNTER — Encounter: Payer: Self-pay | Admitting: Internal Medicine

## 2018-01-08 VITALS — BP 200/120 | HR 89 | Ht 69.0 in | Wt 216.0 lb

## 2018-01-08 DIAGNOSIS — I10 Essential (primary) hypertension: Secondary | ICD-10-CM | POA: Insufficient documentation

## 2018-01-08 DIAGNOSIS — G4733 Obstructive sleep apnea (adult) (pediatric): Secondary | ICD-10-CM

## 2018-01-08 DIAGNOSIS — E1159 Type 2 diabetes mellitus with other circulatory complications: Secondary | ICD-10-CM

## 2018-01-08 DIAGNOSIS — I152 Hypertension secondary to endocrine disorders: Secondary | ICD-10-CM

## 2018-01-08 NOTE — Assessment & Plan Note (Addendum)
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 Plan-we discussed compliance and goals.  Increase AutoPap range to 5-20

## 2018-01-08 NOTE — Assessment & Plan Note (Signed)
She has not been taking her medications or maintaining contact with the primary care office.  We were able to contact her PCP to notify them of blood pressure today.  We have asked her to make an urgent appointment for reevaluation there.

## 2018-01-08 NOTE — Progress Notes (Signed)
HPI 47 year old female smoker followed for OSA, complicated by arthritis, DM 2, HBP, pseudotumor cerebri NPSG 07/03/17- AHI 10.1/ hr, desaturation to 89%, body weight 212 lbs, + PLMA   ------------------------------------------------------------------------------------------------------ 08/25/17-47 year old female smoker for sleep evaluation. Medical problem list includes arthritis, DM 2, HBP, pseudotumor cerebri NPSG - 07/03/17- AHI 10.1/ hr, desaturation to 89%, body weight 212 lbs ----Sleep Consult: Dr Vista Lawman. Pt had sleep study back in 07/08. Pt had CPAP through [facility] but machine broke and has not been using it for last 2 months.  Slept better with CPAP and feels "sluggish" without it.  Told of loud snoring.  Frequent waking at night.  No ENT surgery. Epworth score 13 Smokes "sometimes".  Uses a Flovent inhaler as a rescue for diagnosed asthma.  No bronchodilator.  Denies heart disease.  01/08/2018-47 year old female smoker followed for OSA, complicated by arthritis, DM 2, HBP, pseudotumor cerebri ----Cpap follow up- When she has worn the cpap she feels like she can not breath. CPAP auto 5-15/[facility] begun 08/25/2017. She had packed to move and CPAP machine is still packed up, but she didn't move .  She states she forgot what practice we are with so she did not call for advice. Didn't contact DME either.  She has not taken any of her medications in a couple of months-arrival BP today 200/120 is being reported to the patient's primary care office. She tells me CPAP was not putting out enough air so she felt smothered. She continues to smoke against advice.  ROS-see HPI   + = positive Constitutional:    weight loss, night sweats, fevers, chills,+ fatigue, lassitude. HEENT:    headaches, difficulty swallowing, tooth/dental problems, sore throat,       sneezing, itching, ear ache, nasal congestion, post nasal drip, snoring CV:    chest pain, orthopnea, PND, swelling in lower extremities,  anasarca,                                                  dizziness, palpitations Resp:   shortness of breath with exertion or at rest.                productive cough,   non-productive cough, coughing up of blood.              change in color of mucus.  wheezing.   Skin:    rash or lesions. GI:  No-   heartburn, indigestion, abdominal pain, nausea, vomiting, diarrhea,                 change in bowel habits, loss of appetite GU: dysuria, change in color of urine, no urgency or frequency.   flank pain. MS:   joint pain, stiffness, decreased range of motion, back pain. Neuro-     nothing unusual Psych:  change in mood or affect.  depression or anxiety.   memory loss.  OBJ- Physical Exam   BP 200/120 on arrival General- Alert, Oriented, Affect-appropriate, Distress- none acute, + obese Skin- rash-none, lesions- none, excoriation- none  Lymphadenopathy- none Head- atraumatic            Eyes- Gross vision intact, PERRLA, conjunctivae and secretions clear            Ears- Hearing, canals-normal            Nose- Clear, no-Septal dev, mucus, polyps, erosion,  perforation             Throat- Mallampati III , mucosa clear , drainage- none, tonsils- atrophic, + tongue stud Neck- flexible , trachea midline, no stridor , thyroid nl, carotid no bruit Chest - symmetrical excursion , unlabored           Heart/CV- RRR , no murmur , no gallop  , no rub, nl s1 s2                           - JVD- none , edema- none, stasis changes- none, varices- none           Lung- clear to P&A, wheeze- none, cough- none , dullness-none, rub- none           Chest wall-  Abd-  Br/ Gen/ Rectal- Not done, not indicated Extrem- cyanosis- none, clubbing, none, atrophy- none, strength- nl Neuro- grossly intact to observation

## 2018-01-08 NOTE — Patient Instructions (Signed)
Order- [facility] please increase CPAP auto to 10-20, continue mask of choice, humidifier, supplies, AirView  Please contact Dr Avbuere's office to get help today with your blood pressure. You don't want to have a stroke !  Please call if we can help

## 2018-04-12 ENCOUNTER — Ambulatory Visit: Payer: Medicaid Other | Admitting: Internal Medicine

## 2018-04-12 ENCOUNTER — Encounter: Payer: Self-pay | Admitting: Primary Care

## 2018-04-12 ENCOUNTER — Ambulatory Visit (INDEPENDENT_AMBULATORY_CARE_PROVIDER_SITE_OTHER): Payer: Medicaid Other | Admitting: Primary Care

## 2018-04-12 DIAGNOSIS — E1165 Type 2 diabetes mellitus with hyperglycemia: Secondary | ICD-10-CM | POA: Insufficient documentation

## 2018-04-12 DIAGNOSIS — G4733 Obstructive sleep apnea (adult) (pediatric): Secondary | ICD-10-CM

## 2018-04-12 DIAGNOSIS — E118 Type 2 diabetes mellitus with unspecified complications: Secondary | ICD-10-CM | POA: Insufficient documentation

## 2018-04-12 DIAGNOSIS — E1159 Type 2 diabetes mellitus with other circulatory complications: Secondary | ICD-10-CM

## 2018-04-12 DIAGNOSIS — I10 Essential (primary) hypertension: Secondary | ICD-10-CM

## 2018-04-12 DIAGNOSIS — E119 Type 2 diabetes mellitus without complications: Secondary | ICD-10-CM | POA: Insufficient documentation

## 2018-04-12 DIAGNOSIS — I152 Hypertension secondary to endocrine disorders: Secondary | ICD-10-CM

## 2018-04-12 MED ORDER — BLOOD GLUCOSE MONITOR KIT: PACK | 0 refills | Status: DC

## 2018-04-12 NOTE — Assessment & Plan Note (Signed)
-   Poor compliance, she has not been wearing regularly  - Received new supplies yesterday and states that she will now be wearing every night - Reports that CPAP is effective and helps with her daytime fatigue and that she does not snore when using - Needs follow up in 3-4 months with Dr. Annamaria Boots

## 2018-04-12 NOTE — Progress Notes (Signed)
@Patient  ID: [name], female    DOB: 04-Dec-1970, 47 y.o.   MRN: [id number]  Chief Complaint  Patient presents with  . Follow-up    cpap    Referring provider: Nolene Ebbs, MD  HPI: 47 year old female, current smoker. PMH hypertension, diabetes, obstructive sleep apnea.  Patient of Dr. Annamaria Boots, last seen in June 2019.  During that visit her blood pressure was noted to be significantly elevated 200/110.  She was instructed to follow-up with PCP which she did and was started on medication regimen.  04/12/2018 Presents today for 38-monthoffice visit.  Looks well in office. Feels tired.  She reports that she has not been using her CPAP machine because she did not get new equipment until yesterday.  She received new tubing, mask and filters.  States that she gets nervous with germs but knows how to clean her machine now.  Last time she wore her CPAP was earlier this month and she feels better with use.  She reports less fatigue and no loud snoring when using.  She has app on her phone to track cpap use.   BP significantly elevated today. Rechecked with large cuff on both arms. BP 200/120 left, 200/110 right. She states that she has her blood pressure medication at home and theres no real reason as to why shes not taking it.  She is states that she has never liked taking pills. She is also not taking her diabetes mediation or checking regular glucose levels at home. States that she does not have the right supplies. Denies HA, cp, vision changes.    No Known Allergies   There is no immunization history on file for this patient.  Past Medical History:  Diagnosis Date  . Arthritis   . Diabetes mellitus   . Hypertension   . Pseudotumor cerebri     Tobacco History: Social History   Tobacco Use  Smoking Status Current Every Day Smoker  . Packs/day: 0.50  . 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 Quit date: 03/31/2016   Ready to quit: Not Answered Counseling given:  Not Answered   Outpatient Medications Prior to Visit  Medication Sig Dispense Refill  . amLODipine (NORVASC) 10 MG tablet Take 10 mg by mouth daily.  3  . [employer] VITAMIN D3 1000 units capsule Take 1,000 Units by mouth daily.  2  . gemfibrozil (LOPID) 600 MG tablet Take 600 mg by mouth 2 (two) times daily.  1  . hydrALAZINE (APRESOLINE) 50 MG tablet Take 50 mg by mouth 3 (three) times daily.  3  . ibuprofen (ADVIL,MOTRIN) 800 MG tablet TAKE 1 TABLET (800 MG TOTAL) BY MOUTH EVERY 8 (EIGHT) HOURS AS NEEDED. (Patient not taking: Reported on 01/08/2018) 30 tablet 5  . lisinopril-hydrochlorothiazide (PRINZIDE,ZESTORETIC) 20-12.5 MG tablet Take 2 tablets by mouth daily.  3  . metFORMIN (GLUCOPHAGE) 1000 MG tablet Take 1,000 mg by mouth 2 (two) times daily.  2  . NOVOLOG MIX 70/30 (70-30) 100 UNIT/ML injection INJECT 20 UNITS 2 TIMES DAILY SUBCUTANEOUSLY  5   No facility-administered medications prior to visit.     Review of Systems  Review of Systems  Constitutional: Positive for fatigue. Negative for activity change, appetite change, chills, diaphoresis, fever and unexpected weight change.  HENT: Negative.   Respiratory: Positive for wheezing.   Cardiovascular: Negative.     Physical Exam  BP (!) 200/120 (BP Location: Left Arm, Patient Position: Sitting, Cuff Size: Large)   Pulse (Marland Kitchen  110   Ht 5' 9"  (1.753 m)   Wt 218 lb 6.4 oz (99.1 kg)   SpO2 100%   BMI 32.25 kg/m  Physical Exam  Constitutional: She is oriented to person, place, and time. She appears well-developed and well-nourished.  HENT:  Head: Normocephalic and atraumatic.  Eyes: Pupils are equal, round, and reactive to light. EOM are normal.  Neck: Normal range of motion. Neck supple.  Cardiovascular: Normal rate, regular rhythm and normal heart sounds.  No murmur heard. Pulmonary/Chest: Effort normal and breath sounds normal. No respiratory distress. She has no wheezes.  CTA. No resp distress or wheezing  Abdominal: Soft.  Bowel sounds are normal. There is no tenderness.  Neurological: She is alert and oriented to person, place, and time.  Skin: Skin is warm and dry. No rash noted. No erythema.  Psychiatric: She has a normal mood and affect. Her behavior is normal. Judgment normal.     Lab Results:  CBC    Component Value Date/Time   WBC 9.5 06/28/2015 2002   RBC 4.47 06/28/2015 2002   HGB 13.8 06/28/2015 2002   HCT 38.3 06/28/2015 2002   PLT 227 06/28/2015 2002   MCV 85.7 06/28/2015 2002   MCH 30.9 06/28/2015 2002   MCHC 36.0 06/28/2015 2002   RDW 14.0 06/28/2015 2002   LYMPHSABS 2.8 06/28/2015 2002   MONOABS 0.4 06/28/2015 2002   EOSABS 0.4 06/28/2015 2002   BASOSABS 0.0 06/28/2015 2002    BMET    Component Value Date/Time   NA 136 06/28/2015 2002   K 2.8 (L) 06/28/2015 2002   CL 97 (L) 06/28/2015 2002   CO2 27 06/28/2015 2002   GLUCOSE 297 (H) 06/28/2015 2002   BUN 9 06/28/2015 2002   CREATININE 0.85 06/28/2015 2002   CALCIUM 8.9 06/28/2015 2002   GFRNONAA >60 06/28/2015 2002   GFRAA >60 06/28/2015 2002    BNP No results found for: BNP  ProBNP No results found for: PROBNP  Imaging: No results found.   Assessment & Plan:   Diabetes mellitus, type 2 ([place]) - Not taking diabetic medication - She has Novolog 70/30 and Glucophage medication on hand at home - Does not have supplies to check blood glucose  - Sending glucometer kit with supplies to pharmacy - Needs to follow up with primary care   Hypertension associated with diabetes ([place]) - Significantly elevated, non-compliant with medication - BP 200/110 right arm with large cuff - Strongly advised ED evaluation today for hypertensive emergency, patient refused multiple times - She needs to take her medication today and call PCP for follow visit tomorrow - Recommend patient get home bp cuff to check blood pressure regularly - Patient understands that she is at an increased risk for heart attack, stroke or aneurysm with  untreated high blood pressure - If develops cp, vision changes or severe HA she needs to go to ED    Obstructive sleep apnea - Poor compliance, she has not been wearing regularly  - Received new supplies yesterday and states that she will now be wearing every night - Reports that CPAP is effective and helps with her daytime fatigue and that she does not snore when using - Needs follow up in 3-4 months with Dr. Kaleen Mask, NP 04/12/2018

## 2018-04-12 NOTE — Assessment & Plan Note (Signed)
-   Significantly elevated, non-compliant with medication - BP 200/110 right arm with large cuff - Strongly advised ED evaluation today for hypertensive emergency, patient refused multiple times - She needs to take her medication today and call PCP for follow visit tomorrow - Recommend patient get home bp cuff to check blood pressure regularly - Patient understands that she is at an increased risk for heart attack, stroke or aneurysm with untreated high blood pressure - If develops cp, vision changes or severe HA she needs to go to ED

## 2018-04-12 NOTE — Assessment & Plan Note (Addendum)
-  Not taking diabetic medication - She has Novolog 70/30 and Glucophage medication on hand at home - Does not have supplies to check blood glucose  - Sending glucometer kit with supplies to pharmacy - Needs to follow up with primary care

## 2018-04-12 NOTE — Patient Instructions (Addendum)
We strongly recommend you be evaluated by the Emergency room TODAY for hypertensive emergency. BP 200/110 right arm with large cuff   You need to take ALL medications as prescribed  You need to follow-up with PCP as soon as possible, please call them to schedule a visit (I recommend you be seen by them tomorrow)  I also recommend you consider getting a home blood pressure cuff to monitor your BP  Wear CPAP mask every night, goal 4-6 hours  FU with Dr. Annamaria Boots in 3-4 months for CPAP/OSA   Please present to ED if you develop chest pain, vision problems, severe headache, speech difficulty

## 2018-07-13 DIAGNOSIS — E049 Nontoxic goiter, unspecified: Secondary | ICD-10-CM | POA: Insufficient documentation

## 2018-07-17 ENCOUNTER — Ambulatory Visit: Payer: Medicaid Other | Admitting: Internal Medicine

## 2018-07-18 ENCOUNTER — Other Ambulatory Visit: Payer: Self-pay | Admitting: Otolaryngology

## 2018-07-18 DIAGNOSIS — E049 Nontoxic goiter, unspecified: Secondary | ICD-10-CM

## 2018-07-25 ENCOUNTER — Ambulatory Visit
Admission: RE | Admit: 2018-07-25 | Discharge: 2018-07-25 | Disposition: A | Discharge status: Home / Self Care | Payer: Medicaid Other | Source: Ambulatory Visit | Attending: Otolaryngology | Admitting: Otolaryngology

## 2018-07-25 DIAGNOSIS — E049 Nontoxic goiter, unspecified: Secondary | ICD-10-CM

## 2019-02-11 ENCOUNTER — Emergency Department (HOSPITAL_COMMUNITY): Payer: Medicaid Other

## 2019-02-11 ENCOUNTER — Encounter (HOSPITAL_COMMUNITY): Payer: Self-pay | Admitting: Emergency Medicine

## 2019-02-11 ENCOUNTER — Other Ambulatory Visit: Payer: Self-pay

## 2019-02-11 ENCOUNTER — Inpatient Hospital Stay (HOSPITAL_COMMUNITY)
Admission: EM | Admit: 2019-02-11 | Discharge: 2019-02-13 | DRG: 065 | Disposition: A | Discharge status: Rehabilitation Facility | Payer: Medicaid Other | Attending: Family Medicine | Admitting: Family Medicine

## 2019-02-11 DIAGNOSIS — E669 Obesity, unspecified: Secondary | ICD-10-CM | POA: Diagnosis present

## 2019-02-11 DIAGNOSIS — N179 Acute kidney failure, unspecified: Secondary | ICD-10-CM | POA: Diagnosis present

## 2019-02-11 DIAGNOSIS — I161 Hypertensive emergency: Secondary | ICD-10-CM | POA: Diagnosis present

## 2019-02-11 DIAGNOSIS — F1210 Cannabis abuse, uncomplicated: Secondary | ICD-10-CM | POA: Diagnosis present

## 2019-02-11 DIAGNOSIS — Z794 Long term (current) use of insulin: Secondary | ICD-10-CM

## 2019-02-11 DIAGNOSIS — E1122 Type 2 diabetes mellitus with diabetic chronic kidney disease: Secondary | ICD-10-CM | POA: Diagnosis present

## 2019-02-11 DIAGNOSIS — I6522 Occlusion and stenosis of left carotid artery: Secondary | ICD-10-CM | POA: Diagnosis present

## 2019-02-11 DIAGNOSIS — Z825 Family history of asthma and other chronic lower respiratory diseases: Secondary | ICD-10-CM

## 2019-02-11 DIAGNOSIS — Z833 Family history of diabetes mellitus: Secondary | ICD-10-CM

## 2019-02-11 DIAGNOSIS — Z9119 Patient's noncompliance with other medical treatment and regimen: Secondary | ICD-10-CM

## 2019-02-11 DIAGNOSIS — E1151 Type 2 diabetes mellitus with diabetic peripheral angiopathy without gangrene: Secondary | ICD-10-CM | POA: Diagnosis present

## 2019-02-11 DIAGNOSIS — I160 Hypertensive urgency: Secondary | ICD-10-CM | POA: Diagnosis present

## 2019-02-11 DIAGNOSIS — E1159 Type 2 diabetes mellitus with other circulatory complications: Secondary | ICD-10-CM | POA: Diagnosis not present

## 2019-02-11 DIAGNOSIS — E876 Hypokalemia: Secondary | ICD-10-CM

## 2019-02-11 DIAGNOSIS — Z9114 Patient's other noncompliance with medication regimen: Secondary | ICD-10-CM

## 2019-02-11 DIAGNOSIS — Z841 Family history of disorders of kidney and ureter: Secondary | ICD-10-CM

## 2019-02-11 DIAGNOSIS — G4733 Obstructive sleep apnea (adult) (pediatric): Secondary | ICD-10-CM | POA: Diagnosis present

## 2019-02-11 DIAGNOSIS — R29700 NIHSS score 0: Secondary | ICD-10-CM | POA: Diagnosis present

## 2019-02-11 DIAGNOSIS — Z1159 Encounter for screening for other viral diseases: Secondary | ICD-10-CM

## 2019-02-11 DIAGNOSIS — F17210 Nicotine dependence, cigarettes, uncomplicated: Secondary | ICD-10-CM | POA: Diagnosis present

## 2019-02-11 DIAGNOSIS — E1140 Type 2 diabetes mellitus with diabetic neuropathy, unspecified: Secondary | ICD-10-CM | POA: Diagnosis present

## 2019-02-11 DIAGNOSIS — Z8249 Family history of ischemic heart disease and other diseases of the circulatory system: Secondary | ICD-10-CM

## 2019-02-11 DIAGNOSIS — Z79899 Other long term (current) drug therapy: Secondary | ICD-10-CM

## 2019-02-11 DIAGNOSIS — E1169 Type 2 diabetes mellitus with other specified complication: Secondary | ICD-10-CM | POA: Diagnosis present

## 2019-02-11 DIAGNOSIS — Z6832 Body mass index (BMI) 32.0-32.9, adult: Secondary | ICD-10-CM

## 2019-02-11 DIAGNOSIS — E785 Hyperlipidemia, unspecified: Secondary | ICD-10-CM | POA: Diagnosis present

## 2019-02-11 DIAGNOSIS — E1165 Type 2 diabetes mellitus with hyperglycemia: Secondary | ICD-10-CM | POA: Diagnosis present

## 2019-02-11 DIAGNOSIS — E860 Dehydration: Secondary | ICD-10-CM | POA: Diagnosis present

## 2019-02-11 DIAGNOSIS — I10 Essential (primary) hypertension: Secondary | ICD-10-CM | POA: Diagnosis present

## 2019-02-11 DIAGNOSIS — I639 Cerebral infarction, unspecified: Secondary | ICD-10-CM | POA: Diagnosis present

## 2019-02-11 DIAGNOSIS — I6381 Other cerebral infarction due to occlusion or stenosis of small artery: Principal | ICD-10-CM | POA: Diagnosis present

## 2019-02-11 DIAGNOSIS — H538 Other visual disturbances: Secondary | ICD-10-CM | POA: Diagnosis present

## 2019-02-11 HISTORY — DX: Nontoxic single thyroid nodule: E04.1

## 2019-02-11 LAB — URINALYSIS, ROUTINE W REFLEX MICROSCOPIC
Bilirubin Urine: NEGATIVE
Glucose, UA: >=500 mg/dL — AB
Ketones, ur: NEGATIVE mg/dL
Leukocytes,Ua: NEGATIVE
Nitrite: NEGATIVE
Protein, ur: 100 mg/dL — AB
Specific Gravity, Urine: 1.011 (ref 1.005–1.030)
pH: 6 (ref 5.0–8.0)

## 2019-02-11 LAB — CBC WITH DIFFERENTIAL/PLATELET
Abs Immature Granulocytes: 0.02 10*3/uL (ref 0.00–0.07)
Basophils Absolute: 0 10*3/uL (ref 0.0–0.1)
Basophils Relative: 0 %
Eosinophils Absolute: 0.3 10*3/uL (ref 0.0–0.5)
Eosinophils Relative: 3 %
HCT: 36 % (ref 36.0–46.0)
Hemoglobin: 12.6 g/dL (ref 12.0–15.0)
Immature Granulocytes: 0 %
Lymphocytes Relative: 28 %
Lymphs Abs: 2.6 10*3/uL (ref 0.7–4.0)
MCH: 30.6 pg (ref 26.0–34.0)
MCHC: 35 g/dL (ref 30.0–36.0)
MCV: 87.4 fL (ref 80.0–100.0)
Monocytes Absolute: 0.6 10*3/uL (ref 0.1–1.0)
Monocytes Relative: 6 %
Neutro Abs: 5.6 10*3/uL (ref 1.7–7.7)
Neutrophils Relative %: 63 %
Platelets: 265 10*3/uL (ref 150–400)
RBC: 4.12 MIL/uL (ref 3.87–5.11)
RDW: 14.5 % (ref 11.5–15.5)
WBC: 9 10*3/uL (ref 4.0–10.5)
nRBC: 0 % (ref 0.0–0.2)

## 2019-02-11 LAB — COMPREHENSIVE METABOLIC PANEL
ALT: 13 U/L (ref 0–44)
AST: 12 U/L — ABNORMAL LOW (ref 15–41)
Albumin: 3.2 g/dL — ABNORMAL LOW (ref 3.5–5.0)
Alkaline Phosphatase: 62 U/L (ref 38–126)
Anion gap: 11 (ref 5–15)
BUN: 22 mg/dL — ABNORMAL HIGH (ref 6–20)
CO2: 26 mmol/L (ref 22–32)
Calcium: 8.6 mg/dL — ABNORMAL LOW (ref 8.9–10.3)
Chloride: 100 mmol/L (ref 98–111)
Creatinine, Ser: 1.58 mg/dL — ABNORMAL HIGH (ref 0.44–1.00)
GFR calc Af Amer: 45 mL/min — ABNORMAL LOW (ref >60)
GFR calc non Af Amer: 39 mL/min — ABNORMAL LOW (ref >60)
Glucose, Bld: 221 mg/dL — ABNORMAL HIGH (ref 70–99)
Potassium: 3 mmol/L — ABNORMAL LOW (ref 3.5–5.1)
Sodium: 137 mmol/L (ref 135–145)
Total Bilirubin: 0.5 mg/dL (ref 0.3–1.2)
Total Protein: 7.2 g/dL (ref 6.5–8.1)

## 2019-02-11 LAB — PROTIME-INR
INR: 1 (ref 0.8–1.2)
Prothrombin Time: 12.9 seconds (ref 11.4–15.2)

## 2019-02-11 LAB — RAPID URINE DRUG SCREEN, HOSP PERFORMED
Amphetamines: NOT DETECTED
Barbiturates: NOT DETECTED
Benzodiazepines: NOT DETECTED
Cocaine: NOT DETECTED
Opiates: NOT DETECTED
Tetrahydrocannabinol: POSITIVE — AB

## 2019-02-11 LAB — SARS CORONAVIRUS 2 BY RT PCR (HOSPITAL ORDER, PERFORMED IN ~~LOC~~ HOSPITAL LAB): SARS Coronavirus 2: NEGATIVE

## 2019-02-11 LAB — TROPONIN I (HIGH SENSITIVITY)
Troponin I (High Sensitivity): 26 ng/L — ABNORMAL HIGH (ref <18)
Troponin I (High Sensitivity): 27 ng/L — ABNORMAL HIGH (ref <18)

## 2019-02-11 LAB — BRAIN NATRIURETIC PEPTIDE: B Natriuretic Peptide: 688.3 pg/mL — ABNORMAL HIGH (ref 0.0–100.0)

## 2019-02-11 MED ORDER — POTASSIUM CHLORIDE CRYS ER 20 MEQ PO TBCR
40.0000 meq | EXTENDED_RELEASE_TABLET | Freq: Once | ORAL | Status: AC
  Administered 2019-02-11: 40 meq via ORAL
  Filled 2019-02-11: qty 2

## 2019-02-11 MED ORDER — HYDRALAZINE HCL 20 MG/ML IJ SOLN
10.0000 mg | Freq: Once | INTRAMUSCULAR | Status: AC
  Administered 2019-02-11: 10 mg via INTRAVENOUS
  Filled 2019-02-11: qty 1

## 2019-02-11 MED ORDER — AMLODIPINE BESYLATE 5 MG PO TABS
10.0000 mg | ORAL_TABLET | Freq: Once | ORAL | Status: AC
  Administered 2019-02-11: 10 mg via ORAL
  Filled 2019-02-11: qty 2

## 2019-02-11 MED ORDER — HYDRALAZINE HCL 20 MG/ML IJ SOLN
5.0000 mg | INTRAMUSCULAR | Status: DC | PRN
  Administered 2019-02-11 – 2019-02-12 (×2): 5 mg via INTRAVENOUS
  Filled 2019-02-11 (×2): qty 1

## 2019-02-11 NOTE — ED Triage Notes (Signed)
Pt reports right facial numbness yesterday around 4pm. States "I am able to feel my face when I touch, it just feels weird. Speaking clear sentences and answers all questions appropriatly. Known hx of HTN, DM home reading of 222/120.

## 2019-02-11 NOTE — ED Notes (Signed)
ED TO INPATIENT HANDOFF REPORT  ED Nurse Name and Phone #: Tray [place], [phone]  S Name/Age/Gender [name] 48 y.o. female Room/Bed: 037C/037C  Code Status   Code Status: Not on file  Home/SNF/Other Home Patient oriented to: self, place, time and situation Is this baseline? Yes   Triage Complete: Triage complete  Chief Complaint Facial Numbness; HTN  Triage Note Pt reports right facial numbness yesterday around 4pm. States "I am able to feel my face when I touch, it just feels weird. Speaking clear sentences and answers all questions appropriatly. Known hx of HTN, DM home reading of 222/120.    Allergies No Known Allergies  Level of Care/Admitting Diagnosis ED Disposition    ED Disposition Condition Comment   Admit  Hospital Area: [facility] [[id number]]  Level of Care: Telemetry Medical [104]  I expect the patient will be discharged within 24 hours: No (not a candidate for 5C-Observation unit)  Covid Evaluation: Person Under Investigation (PUI)  Diagnosis: Acute CVA (cerebrovascular accident) [facility]) [[id number]]  Admitting Physician: Rise Patience [phone]  Attending Physician: Rise Patience [email or web address]  PT Class (Do Not Modify): Observation [104]  PT Acc Code (Do Not Modify): Observation [10022]       B Medical/Surgery History Past Medical History:  Diagnosis Date  . Arthritis   . Diabetes mellitus   . Hypertension   . Pseudotumor cerebri    Past Surgical History:  Procedure Laterality Date  . ACHILLES TENDON REPAIR       A IV Location/Drains/Wounds Patient Lines/Drains/Airways Status   Active Line/Drains/Airways    Name:   Placement date:   Placement time:   Site:   Days:   Peripheral IV 02/11/19 Left;Lateral Forearm   02/11/19    1741    Forearm   less than 1          Intake/Output Last 24 hours No intake or output data in the 24 hours ending 02/11/19 2247  Labs/Imaging Results for orders placed or performed during  the hospital encounter of 02/11/19 (from the past 48 hour(s))  Comprehensive metabolic panel     Status: Abnormal   Collection Time: 02/11/19  5:16 PM  Result Value Ref Range   Sodium 137 135 - 145 mmol/L   Potassium 3.0 (L) 3.5 - 5.1 mmol/L   Chloride 100 98 - 111 mmol/L   CO2 26 22 - 32 mmol/L   Glucose, Bld 221 (H) 70 - 99 mg/dL   BUN 22 (H) 6 - 20 mg/dL   Creatinine, Ser 1.58 (H) 0.44 - 1.00 mg/dL   Calcium 8.6 (L) 8.9 - 10.3 mg/dL   Total Protein 7.2 6.5 - 8.1 g/dL   Albumin 3.2 (L) 3.5 - 5.0 g/dL   AST 12 (L) 15 - 41 U/L   ALT 13 0 - 44 U/L   Alkaline Phosphatase 62 38 - 126 U/L   Total Bilirubin 0.5 0.3 - 1.2 mg/dL   GFR calc non Af Amer 39 (L) >60 mL/min   GFR calc Af Amer 45 (L) >60 mL/min   Anion gap 11 5 - 15    Comment: Performed at [facility], [address], [place], [place] [address]  Brain natriuretic peptide     Status: Abnormal   Collection Time: 02/11/19  5:16 PM  Result Value Ref Range   B Natriuretic Peptide 688.3 (H) 0.0 - 100.0 pg/mL    Comment: Performed at [place],  [place] [address]  Troponin I (High Sensitivity)     Status: Abnormal   Collection Time: 02/11/19  5:16 PM  Result Value Ref Range   Troponin I (High Sensitivity) 26 (H) <18 ng/L    Comment: (NOTE) Elevated high sensitivity troponin I (hsTnI) values and significant  changes across serial measurements may suggest ACS but many other  chronic and acute conditions are known to elevate hsTnI results.  Refer to the "Links" section for chest pain algorithms and additional  guidance. Performed at [facility], [place] [address], [place], [place] [address]   CBC with Differential     Status: None   Collection Time: 02/11/19  5:16 PM  Result Value Ref Range   WBC 9.0 4.0 - 10.5 K/uL   RBC 4.12 3.87 - 5.11 MIL/uL   Hemoglobin 12.6 12.0 - 15.0 g/dL   HCT 36.0 36.0 - 46.0 %   MCV 87.4 80.0 - 100.0 fL   MCH 30.6 26.0 - 34.0 pg   MCHC 35.0 30.0 - 36.0  g/dL   RDW 14.5 11.5 - 15.5 %   Platelets 265 150 - 400 K/uL   nRBC 0.0 0.0 - 0.2 %   Neutrophils Relative % 63 %   Neutro Abs 5.6 1.7 - 7.7 K/uL   Lymphocytes Relative 28 %   Lymphs Abs 2.6 0.7 - 4.0 K/uL   Monocytes Relative 6 %   Monocytes Absolute 0.6 0.1 - 1.0 K/uL   Eosinophils Relative 3 %   Eosinophils Absolute 0.3 0.0 - 0.5 K/uL   Basophils Relative 0 %   Basophils Absolute 0.0 0.0 - 0.1 K/uL   Immature Granulocytes 0 %   Abs Immature Granulocytes 0.02 0.00 - 0.07 K/uL    Comment: Performed at [facility], [address], [place], [place] [address]  Protime-INR     Status: None   Collection Time: 02/11/19  5:16 PM  Result Value Ref Range   Prothrombin Time 12.9 11.4 - 15.2 seconds   INR 1.0 0.8 - 1.2    Comment: (NOTE) INR goal varies based on device and disease states. Performed at [facility], [place] [address], [place], [place] [address]   Urine rapid drug screen (hosp performed)     Status: Abnormal   Collection Time: 02/11/19  5:16 PM  Result Value Ref Range   Opiates NONE DETECTED NONE DETECTED   Cocaine NONE DETECTED NONE DETECTED   Benzodiazepines NONE DETECTED NONE DETECTED   Amphetamines NONE DETECTED NONE DETECTED   Tetrahydrocannabinol POSITIVE (A) NONE DETECTED   Barbiturates NONE DETECTED NONE DETECTED    Comment: (NOTE) DRUG SCREEN FOR MEDICAL PURPOSES ONLY.  IF CONFIRMATION IS NEEDED FOR ANY PURPOSE, NOTIFY LAB WITHIN 5 DAYS. LOWEST DETECTABLE LIMITS FOR URINE DRUG SCREEN Drug Class                     Cutoff (ng/mL) Amphetamine and metabolites    1000 Barbiturate and metabolites    200 Benzodiazepine                 443 Tricyclics and metabolites     300 Opiates and metabolites        300 Cocaine and metabolites        300 THC                            50 Performed at [facility], [place]  [address] [place], [place] [address]   Urinalysis, Routine w reflex microscopic     Status: Abnormal   Collection Time: 02/11/19   5:40 PM  Result Value Ref Range   Color, Urine YELLOW YELLOW   APPearance HAZY (A) CLEAR   Specific Gravity, Urine 1.011 1.005 - 1.030   pH 6.0 5.0 - 8.0   Glucose, UA >=500 (A) NEGATIVE mg/dL   Hgb urine dipstick SMALL (A) NEGATIVE   Bilirubin Urine NEGATIVE NEGATIVE   Ketones, ur NEGATIVE NEGATIVE mg/dL   Protein, ur 100 (A) NEGATIVE mg/dL   Nitrite NEGATIVE NEGATIVE   Leukocytes,Ua NEGATIVE NEGATIVE   RBC / HPF 0-5 0 - 5 RBC/hpf   WBC, UA 0-5 0 - 5 WBC/hpf   Bacteria, UA FEW (A) NONE SEEN   Squamous Epithelial / LPF 6-10 0 - 5    Comment: Performed at [facility], [address], [place], [place] [address]  Troponin I (High Sensitivity)     Status: Abnormal   Collection Time: 02/11/19  7:16 PM  Result Value Ref Range   Troponin I (High Sensitivity) 27 (H) <18 ng/L    Comment: (NOTE) Elevated high sensitivity troponin I (hsTnI) values and significant  changes across serial measurements may suggest ACS but many other  chronic and acute conditions are known to elevate hsTnI results.  Refer to the "Links" section for chest pain algorithms and additional  guidance. Performed at [facility], [place] [address], [place], [place] [address]   SARS Coronavirus 2 (CEPHEID - Performed in  [facility]), Hosp Order     Status: None   Collection Time: 02/11/19  8:56 PM   Specimen: Nasopharyngeal Swab  Result Value Ref Range   SARS Coronavirus 2 NEGATIVE NEGATIVE    Comment: (NOTE) If result is NEGATIVE SARS-CoV-2 target nucleic acids are NOT DETECTED. The SARS-CoV-2 RNA is generally detectable in upper and lower  respiratory specimens during the acute phase of infection. The lowest  concentration of SARS-CoV-2 viral copies this assay can detect is 250  copies / mL. A negative result does not preclude SARS-CoV-2 infection  and should not be used as the sole basis for treatment or other  patient management decisions.  A negative result may occur with  improper  specimen collection / handling, submission of specimen other  than nasopharyngeal swab, presence of viral mutation(s) within the  areas targeted by this assay, and inadequate number of viral copies  (<250 copies / mL). A negative result must be combined with clinical  observations, patient history, and epidemiological information. If result is POSITIVE SARS-CoV-2 target nucleic acids are DETECTED. The SARS-CoV-2 RNA is generally detectable in upper and lower  respiratory specimens dur ing the acute phase of infection.  Positive  results are indicative of active infection with SARS-CoV-2.  Clinical  correlation with patient history and other diagnostic information is  necessary to determine patient infection status.  Positive results do  not rule out bacterial infection or co-infection with other viruses. If result is PRESUMPTIVE POSTIVE SARS-CoV-2 nucleic acids MAY BE PRESENT.   A presumptive positive result was obtained on the submitted specimen  and confirmed on repeat testing.  While 2019 novel coronavirus  (SARS-CoV-2) nucleic acids may be present in the submitted sample  additional confirmatory testing may be necessary for epidemiological  and / or clinical management purposes  to differentiate between  SARS-CoV-2 and other Sarbecovirus currently known to infect humans.  If clinically indicated additional testing with  an alternate test  methodology [phone]) is advised. The SARS-CoV-2 RNA is generally  detectable in upper and lower respiratory sp ecimens during the acute  phase of infection. The expected result is Negative. Fact Sheet for Patients:  StrictlyIdeas.no Fact Sheet for Healthcare Providers: BankingDealers.co.za This test is not yet approved or cleared by the [place] FDA and has been authorized for detection and/or diagnosis of SARS-CoV-2 by FDA under an Emergency Use Authorization (EUA).  This EUA will remain in  effect (meaning this test can be used) for the duration of the COVID-19 declaration under Section 564(b)(1) of the Act, 21 U.S.C. section 360bbb-3(b)(1), unless the authorization is terminated or revoked sooner. Performed at [facility], [place] [address], [place], [place] [address]    Mr Brain Wo Contrast (neuro Protocol)  Result Date: 02/11/2019 CLINICAL DATA:  Initial evaluation for acute right facial numbness. EXAM: MRI HEAD WITHOUT CONTRAST TECHNIQUE: Multiplanar, multiecho pulse sequences of the brain and surrounding structures were obtained without intravenous contrast. COMPARISON:  Previous brain MRI from 12/16/2005. FINDINGS: Brain: Cerebral volume within normal limits for age. Mild scattered subcentimeter T2/FLAIR hyperintensities seen involving the periventricular and deep white matter both cerebral hemispheres, nonspecific, but most like related chronic microvascular ischemic disease. Chronic microvascular ischemic changes present within the pons as well. Patchy small volume restricted diffusion seen involving the left lateral midbrain/pons (series 5, image 73), consistent with acute ischemic infarct. This measures approximately 1 cm in greatest dimension. No associated hemorrhage or mass effect. No other evidence for acute or subacute ischemia gray-white matter differentiation otherwise maintained. No other areas of chronic cortical infarction. No foci of susceptibility artifact to suggest acute or chronic intracranial hemorrhage. No mass lesion, midline shift or mass effect. No hydrocephalus. No extra-axial fluid collection. Pituitary gland suprasellar region within normal limits. Midline structures intact. Vascular: Major intracranial vascular flow voids are maintained. Skull and upper cervical spine: Craniocervical junction within normal limits. Bone marrow signal intensity diffusely decreased on T1 weighted imaging, most commonly related to anemia, smoking, or obesity. No focal marrow  replacing lesion. No scalp soft tissue abnormality. Sinuses/Orbits: Globes and orbital soft tissues within normal limits. Mild scattered mucosal thickening throughout the paranasal sinuses. No air-fluid level to suggest acute sinusitis. No mastoid effusion. Inner ear structures normal. Other: None. IMPRESSION: 1. Patchy small volume acute ischemic nonhemorrhagic infarct involving the left lateral midbrain/pons. 2. Underlying mild chronic microvascular ischemic disease. Electronically Signed   By: Jeannine Boga M.D.   On: 02/11/2019 19:28   Dg Chest Port 1 View  Result Date: 02/11/2019 CLINICAL DATA:  Hypertension. EXAM: PORTABLE CHEST 1 VIEW COMPARISON:  Radiograph 05/08/2006, no more recent exam. FINDINGS: Mild to moderate cardiomegaly. Normal mediastinal contours. Mild central bronchial thickening. No pulmonary edema, focal airspace disease, pleural effusion or pneumothorax. IMPRESSION: Mild to moderate cardiomegaly. Mild central bronchial thickening. Electronically Signed   By: Keith Rake M.D.   On: 02/11/2019 19:50    Pending Labs [employer] (From admission, onward)    Start     Ordered   Signed and Held  HIV antibody (Routine Testing)  Tomorrow morning,   R     Signed and Held   Signed and Held  Hemoglobin A1c  Tomorrow morning,   R     Signed and Held   Signed and Held  Lipid panel  Tomorrow morning,   R    Comments: Fasting    Signed and Held   Signed and Held  CBC  (enoxaparin (LOVENOX)  CrCl >/= 30 ml/min)  Once,   R    Comments: Baseline for enoxaparin therapy IF NOT ALREADY DRAWN.  Notify MD if PLT < 100 K.    Signed and Held   Signed and Held  Creatinine, serum  (enoxaparin (LOVENOX)    CrCl >/= 30 ml/min)  Once,   R    Comments: Baseline for enoxaparin therapy IF NOT ALREADY DRAWN.    Signed and Held   Signed and Held  Creatinine, serum  (enoxaparin (LOVENOX)    CrCl >/= 30 ml/min)  Weekly,   R    Comments: while on enoxaparin therapy    Signed and Held    Signed and Held  CBC  Tomorrow morning,   R     Signed and Held   Signed and Held  Comprehensive metabolic panel  Tomorrow morning,   R     Signed and Held          Vitals/Pain Today's Vitals   02/11/19 1800 02/11/19 1805 02/11/19 1930 02/11/19 2000  BP: (!) 208/130  (!) 188/96 (!) 184/92  Pulse: 88  89   Resp: (!) 22  16 18   Temp:      TempSrc:      SpO2: 98%  97%   PainSc:  2       Isolation Precautions No active isolations  Medications Medications  hydrALAZINE (APRESOLINE) injection 5 mg (has no administration in time range)  potassium chloride SA (K-DUR) CR tablet 40 mEq (has no administration in time range)  amLODipine (NORVASC) tablet 10 mg (10 mg Oral Given 02/11/19 1758)  hydrALAZINE (APRESOLINE) injection 10 mg (10 mg Intravenous Given 02/11/19 1800)    Mobility walks     Focused Assessments Neuro Assessment Handoff:  Swallow screen pass? Yes    NIH Stroke Scale ( + Modified Stroke Scale Criteria)  Interval: Initial Level of Consciousness (1a.)   : Alert, keenly responsive LOC Questions (1b. )   +: Answers both questions correctly LOC Commands (1c. )   + : Performs both tasks correctly Best Gaze (2. )  +: Normal Visual (3. )  +: No visual loss Facial Palsy (4. )    : Normal symmetrical movements Motor Arm, Left (5a. )   +: No drift Motor Arm, Right (5b. )   +: No drift Motor Leg, Left (6a. )   +: No drift Motor Leg, Right (6b. )   +: No drift Limb Ataxia (7. ): Absent Sensory (8. )   +: Normal, no sensory loss Best Language (9. )   +: No aphasia Dysarthria (10. ): Normal Extinction/Inattention (11.)   +: No Abnormality Modified SS Total  +: 0 Complete NIHSS TOTAL: 0     Neuro Assessment:   Neuro Checks:   Initial (02/11/19 1701)  Last Documented NIHSS Modified Score: 0 (02/11/19 1952) Has TPA been given? No If patient is a Neuro Trauma and patient is going to OR before floor call report to [place] nurse: [phone] or  [phone]     R Recommendations: See Admitting Provider Note  Report given to:   Additional Notes:

## 2019-02-11 NOTE — ED Notes (Signed)
Pt states she feels vision is blurre din right eye after MRI.

## 2019-02-11 NOTE — ED Notes (Signed)
PCXR

## 2019-02-11 NOTE — ED Notes (Signed)
Attempted ekg x 2, phillips issue. EMT to attempt. EDP currently at bedside.

## 2019-02-11 NOTE — H&P (Signed)
History and Physical    [name] XQK:[id number] DOB: 1970/12/31 DOA: 02/11/2019  PCP: [name], MD  Patient coming from: Home.  Chief Complaint: Right facial numbness.  HPI: [name] is a 48 y.o. female with history of hypertension, diabetes mellitus presents to the ER with complaints of right facial numbness which happened almost 48 hours prior to presenting to the ER.  Denies any weakness of the upper or lower extremities or any difficulty swallowing or speaking.  Patient thought initially that the numbness was from a allergic reaction to eating lobster.  But the symptoms persisted.  Patient also noticed her blood pressure has been elevated.  Patient had some leftover antihypertensives which she took.  Patient has not been taking her medication for almost 2 months now including her insulin and antihypertensives.  Since she had not visited her PCP for refills.  Patient denies any chest pain or shortness of breath has some mild headache in the occipital area.  ED Course: In the ER patient was able to move all extremities without difficulty.  Blood pressure was markedly elevated with systolic 959 diastolic 747 on presentation.  Patient had CT head followed by MRI of the brain which shows stroke involving the left lateral midbrain and pons.  Neurologist on-call was consulted.  Admitted for further management of acute stroke.  At this time neurologist advised to keep blood pressure around 185 systolic and diastolic 501.  Labs show potassium of 3 creatinine 1.5 which is increased from baseline with old labs are more than 47 years old.  Patient was given potassium replacement and patient also was given amlodipine which he has usually taken at but not for last 2 months.  Hydralazine 1 dose was given for blood pressure control.  [name] high sensitive troponin was 27 and 26 but denies any chest pain EKG showed nonspecific changes.  Patient after coming also was complaining of some  retro-orbital pain on the right side with blurriness.  Review of Systems: As per HPI, rest all negative.   Past Medical History:  Diagnosis Date   Arthritis    Diabetes mellitus    Hypertension    Pseudotumor cerebri     Past Surgical History:  Procedure Laterality Date   ACHILLES TENDON REPAIR       reports that she has been smoking cigarettes. She has been smoking about 0.50 packs per day. She quit smokeless tobacco use about 2 years ago. She reports current alcohol use. She reports current drug use. Drug: Marijuana.  No Known Allergies  Family History  Problem Relation Age of Onset   Hypertension Father    Kidney disease Father    Asthma Other    Hyperlipidemia Other    Hypertension Other    Cancer Other     Prior to Admission medications   Medication Sig Start Date End Date Taking? Authorizing Provider  albuterol (VENTOLIN HFA) 108 (90 Base) MCG/ACT inhaler Inhale into the lungs every 6 (six) hours as needed for wheezing or shortness of breath.   Yes [provider]  amLODipine (NORVASC) 10 MG tablet Take 10 mg by mouth daily. 07/18/16  Yes [provider]  cholecalciferol (VITAMIN D3) 25 MCG (1000 UT) tablet Take 1,000 Units by mouth daily.   Yes [provider]  glipiZIDE (GLUCOTROL) 10 MG tablet Take 10 mg by mouth daily before breakfast.   Yes [provider]  insulin aspart protamine- aspart (NOVOLOG MIX 70/30) (70-30) 100 UNIT/ML injection Inject 20 Units  into the skin 2 (two) times daily with a meal.   Yes [provider]  lisinopril-hydrochlorothiazide (PRINZIDE,ZESTORETIC) 20-12.5 MG tablet Take 2 tablets by mouth daily. 07/18/16  Yes [provider]  blood glucose meter kit and supplies KIT Dispense based on patient and insurance preference. Use up to four times daily as directed. (FOR ICD-9 250.00, 250.01). 04/12/18   Martyn Ehrich, NP  ibuprofen (ADVIL,MOTRIN) 800 MG tablet TAKE 1 TABLET (800  MG TOTAL) BY MOUTH EVERY 8 (EIGHT) HOURS AS NEEDED. Patient not taking: Reported on 01/08/2018 06/29/16   Shelly Bombard, MD    Physical Exam: Constitutional: Moderately built and nourished. Vitals:   02/11/19 1730 02/11/19 1758 02/11/19 1800 02/11/19 1930  BP:  (!) 208/103 (!) 208/130 (!) 188/96  Pulse: 85  88 89  Resp: 14  (!) 22 16  Temp:      TempSrc:      SpO2: 99%  98% 97%   Eyes: Anicteric no pallor. ENMT: No discharge from the ears eyes nose or mouth. Neck: No mass felt.  No neck rigidity. Respiratory: No rhonchi or crepitations. Cardiovascular: S1-S2 heard. Abdomen: Soft nontender bowel sounds present. Musculoskeletal: No edema.  No joint effusion. Skin: No rash. Neurologic: Alert awake oriented to time place and person.  Moves all extremities 5 x 5.  No facial asymmetry tongue is midline.  Pupils are equal and reacting to light.  Able to read in both eyes. Psychiatric: Appears normal per normal affect.   Labs on Admission: I have personally reviewed following labs and imaging studies  CBC: Recent Labs  Lab 02/11/19 1716  WBC 9.0  NEUTROABS 5.6  HGB 12.6  HCT 36.0  MCV 87.4  PLT 333   Basic Metabolic Panel: Recent Labs  Lab 02/11/19 1716  NA 137  K 3.0*  CL 100  CO2 26  GLUCOSE 221*  BUN 22*  CREATININE 1.58*  CALCIUM 8.6*   GFR: CrCl cannot be calculated (Unknown ideal weight.). Liver Function Tests: Recent Labs  Lab 02/11/19 1716  AST 12*  ALT 13  ALKPHOS 62  BILITOT 0.5  PROT 7.2  ALBUMIN 3.2*   No results for input(s): LIPASE, AMYLASE in the last 168 hours. No results for input(s): AMMONIA in the last 168 hours. Coagulation Profile: Recent Labs  Lab 02/11/19 1716  INR 1.0   Cardiac Enzymes: No results for input(s): CKTOTAL, CKMB, CKMBINDEX, TROPONINI in the last 168 hours. BNP (last 3 results) No results for input(s): PROBNP in the last 8760 hours. HbA1C: No results for input(s): HGBA1C in the last 72 hours. CBG: No  results for input(s): GLUCAP in the last 168 hours. Lipid Profile: No results for input(s): CHOL, HDL, LDLCALC, TRIG, CHOLHDL, LDLDIRECT in the last 72 hours. Thyroid Function Tests: No results for input(s): TSH, T4TOTAL, FREET4, T3FREE, THYROIDAB in the last 72 hours. Anemia Panel: No results for input(s): VITAMINB12, FOLATE, FERRITIN, TIBC, IRON, RETICCTPCT in the last 72 hours. Urine analysis:    Component Value Date/Time   COLORURINE YELLOW 02/11/2019 1740   APPEARANCEUR HAZY (A) 02/11/2019 1740   LABSPEC 1.011 02/11/2019 1740   PHURINE 6.0 02/11/2019 1740   GLUCOSEU >=500 (A) 02/11/2019 1740   HGBUR SMALL (A) 02/11/2019 1740   BILIRUBINUR NEGATIVE 02/11/2019 1740   KETONESUR NEGATIVE 02/11/2019 1740   PROTEINUR 100 (A) 02/11/2019 1740   UROBILINOGEN 0.2 08/25/2011 2043   NITRITE NEGATIVE 02/11/2019 1740   LEUKOCYTESUR NEGATIVE 02/11/2019 1740   Sepsis Labs: _0 (procalcitonin:4,lacticidven:4) )No results found for this  or any previous visit (from the past 240 hour(s)).   Radiological Exams on Admission: Mr Brain Wo Contrast (neuro Protocol)  Result Date: 02/11/2019 CLINICAL DATA:  Initial evaluation for acute right facial numbness. EXAM: MRI HEAD WITHOUT CONTRAST TECHNIQUE: Multiplanar, multiecho pulse sequences of the brain and surrounding structures were obtained without intravenous contrast. COMPARISON:  Previous brain MRI from 12/16/2005. FINDINGS: Brain: Cerebral volume within normal limits for age. Mild scattered subcentimeter T2/FLAIR hyperintensities seen involving the periventricular and deep white matter both cerebral hemispheres, nonspecific, but most like related chronic microvascular ischemic disease. Chronic microvascular ischemic changes present within the pons as well. Patchy small volume restricted diffusion seen involving the left lateral midbrain/pons (series 5, image 73), consistent with acute ischemic infarct. This measures approximately 1 cm in greatest  dimension. No associated hemorrhage or mass effect. No other evidence for acute or subacute ischemia gray-white matter differentiation otherwise maintained. No other areas of chronic cortical infarction. No foci of susceptibility artifact to suggest acute or chronic intracranial hemorrhage. No mass lesion, midline shift or mass effect. No hydrocephalus. No extra-axial fluid collection. Pituitary gland suprasellar region within normal limits. Midline structures intact. Vascular: Major intracranial vascular flow voids are maintained. Skull and upper cervical spine: Craniocervical junction within normal limits. Bone marrow signal intensity diffusely decreased on T1 weighted imaging, most commonly related to anemia, smoking, or obesity. No focal marrow replacing lesion. No scalp soft tissue abnormality. Sinuses/Orbits: Globes and orbital soft tissues within normal limits. Mild scattered mucosal thickening throughout the paranasal sinuses. No air-fluid level to suggest acute sinusitis. No mastoid effusion. Inner ear structures normal. Other: None. IMPRESSION: 1. Patchy small volume acute ischemic nonhemorrhagic infarct involving the left lateral midbrain/pons. 2. Underlying mild chronic microvascular ischemic disease. Electronically Signed   By: Jeannine Boga M.D.   On: 02/11/2019 19:28   Dg Chest Port 1 View  Result Date: 02/11/2019 CLINICAL DATA:  Hypertension. EXAM: PORTABLE CHEST 1 VIEW COMPARISON:  Radiograph 05/08/2006, no more recent exam. FINDINGS: Mild to moderate cardiomegaly. Normal mediastinal contours. Mild central bronchial thickening. No pulmonary edema, focal airspace disease, pleural effusion or pneumothorax. IMPRESSION: Mild to moderate cardiomegaly. Mild central bronchial thickening. Electronically Signed   By: Keith Rake M.D.   On: 02/11/2019 19:50    EKG: Independently reviewed.  Normal sinus rhythm nonspecific T changes.  Assessment/Plan Active Problems:   Obstructive sleep  apnea   Acute CVA (cerebrovascular accident) ([name])   Hypertensive urgency   ARF (acute renal failure) ([place])   Type 2 diabetes mellitus with vascular disease ([place])    1. Acute CVA -discussed with Dr. Lorraine Lax on-call neurologist.  Patient passed swallow.  Patient will be on aspirin and Lipitor neurochecks check hemoglobin A1c lipid panel physical therapy consult.  Check 2D echo CT angiogram of the head and neck has been ordered by neurologist. 2. Hypertensive urgency -neurologist has recommended to keep the blood pressure systolic 737 and diastolic 106.  We have started back patient on amlodipine and PRN IV hydralazine for now.  Since creatinine is elevated I did not restart patient lisinopril and hydrochlorothiazide which patient used to take 2 months ago.  Closely follow blood pressure trends. 3. Hypokalemia cause not clear.  Replace recheck. 4. Diabetes mellitus type 2 uncontrolled with hyperglycemia not in DKA I have started patient on Lantus 10 units with sliding scale coverage.  Follow hemoglobin A1c and CBG. 5. Acute renal failure -gently hydrate follow metabolic panel.  Check UA. 6. Sleep apnea on CPAP.  Has not been  using for last 1 month. 7. Marijuana abuse -Education officer, museum consult. 8. History of pseudotumor cerebri per the chart.   DVT prophylaxis: Lovenox. Code Status: Full code. Family Communication: Discussed with patient. Disposition Plan: Home. Consults called: Neurology. Admission status: Observation.   Rise Patience MD [facility] Pager [phone].  If 7PM-7AM, please contact night-coverage [email or web address] Password [facility]  02/11/2019, 8:49 PM

## 2019-02-11 NOTE — ED Provider Notes (Addendum)
[facility] EMERGENCY DEPARTMENT Provider Note   CSN: [id number] Arrival date & time: 02/11/19  1621    History   Chief Complaint Chief Complaint  Patient presents with   Numbness    facial    Hypertension    HPI [name] is a 48 y.o. female.     HPI Reports that she started to notice some numbness on the right side of her face yesterday around 4 PM.  She reports he just felt a little unusual to touch.  She did not notice any drooping or difficulty speaking.  She did not have any weakness numbness or tingling of her arms and legs.  She did not find it any extremity was not working.  She reports she does note sometimes her balance seems a bit off but that comes and goes.  She reports she might of noted some blurring in her vision.  No loss of vision.  Patient reports that she has noticed some headache that is pressure-like at the back of her head that also can come and go.  Yesterday was the first time she did notice any numbness however.  Reports her sister finally talked her into coming to the hospital.  Patient reports that she has been out of her medications.  She was afraid to get them filled and all of the coronavirus directions were in place.  Is been off of her antihypertensives at least several weeks.  She reports she is otherwise felt well though she has not had fevers or chills, no cough no shortness of breath.  No swelling in the legs.  Ports he chronically has some peripheral neuropathy in the lower legs and the feet and she does not notice any acute change in severity. Past Medical History:  Diagnosis Date   Arthritis    Diabetes mellitus    Hypertension    Pseudotumor cerebri     Patient Active Problem List   Diagnosis Date Noted   Acute CVA (cerebrovascular accident) ([place]) 02/11/2019   Diabetes mellitus, type 2 ([place]) 04/12/2018   Hypertension associated with diabetes ([place]) 01/08/2018   Obstructive sleep apnea 08/26/2017    Past  Surgical History:  Procedure Laterality Date   ACHILLES TENDON REPAIR       OB History   No obstetric history on file.      Home Medications    Prior to Admission medications   Medication Sig Start Date End Date Taking? Authorizing Provider  albuterol (VENTOLIN HFA) 108 (90 Base) MCG/ACT inhaler Inhale into the lungs every 6 (six) hours as needed for wheezing or shortness of breath.   Yes [provider]  amLODipine (NORVASC) 10 MG tablet Take 10 mg by mouth daily. 07/18/16  Yes [provider]  cholecalciferol (VITAMIN D3) 25 MCG (1000 UT) tablet Take 1,000 Units by mouth daily.   Yes [provider]  glipiZIDE (GLUCOTROL) 10 MG tablet Take 10 mg by mouth daily before breakfast.   Yes [provider]  insulin aspart protamine- aspart (NOVOLOG MIX 70/30) (70-30) 100 UNIT/ML injection Inject 20 Units into the skin 2 (two) times daily with a meal.   Yes [provider]  lisinopril-hydrochlorothiazide (PRINZIDE,ZESTORETIC) 20-12.5 MG tablet Take 2 tablets by mouth daily. 07/18/16  Yes [provider]  blood glucose meter kit and supplies KIT Dispense based on patient and insurance preference. Use up to four times daily as directed. (FOR ICD-9 250.00, 250.01). 04/12/18   Martyn Ehrich, NP  ibuprofen (ADVIL,MOTRIN)  800 MG tablet TAKE 1 TABLET (800 MG TOTAL) BY MOUTH EVERY 8 (EIGHT) HOURS AS NEEDED. Patient not taking: Reported on 01/08/2018 06/29/16   Shelly Bombard, MD    Family History Family History  Problem Relation Age of Onset   Hypertension Father    Kidney disease Father    Asthma Other    Hyperlipidemia Other    Hypertension Other    Cancer Other     Social History Social History   Tobacco Use   Smoking status: Current Every Day Smoker    Packs/day: 0.50    Types: Cigarettes   Smokeless tobacco: Former Systems developer    Quit date: 03/31/2016  Substance Use Topics   Alcohol use: Yes    Alcohol/week: 0.0  standard drinks    Comment: Occassional    Drug use: Yes    Types: Marijuana    Comment: Occassional      Allergies   Patient has no known allergies.   Review of Systems Review of Systems 10 Systems reviewed and are negative for acute change except as noted in the HPI.   Physical Exam Updated Vital Signs BP (!) 188/96 (BP Location: Right Arm)    Pulse 89    Temp 98.6 F (37 C) (Oral)    Resp 16    SpO2 97%   Physical Exam Constitutional:      Appearance: She is well-developed.  HENT:     Head: Normocephalic and atraumatic.  Eyes:     Pupils: Pupils are equal, round, and reactive to light.  Neck:     Musculoskeletal: Neck supple.  Cardiovascular:     Rate and Rhythm: Normal rate and regular rhythm.     Heart sounds: Normal heart sounds.  Pulmonary:     Effort: Pulmonary effort is normal.     Breath sounds: Normal breath sounds.  Abdominal:     General: Bowel sounds are normal. There is no distension.     Palpations: Abdomen is soft.     Tenderness: There is no abdominal tenderness.  Musculoskeletal: Normal range of motion.  Skin:    General: Skin is warm and dry.  Neurological:     General: No focal deficit present.     Mental Status: She is alert and oriented to person, place, and time.     GCS: GCS eye subscore is 4. GCS verbal subscore is 5. GCS motor subscore is 6.     Motor: No weakness.     Coordination: Coordination normal.     Comments: Normal cranial nerves.  Patient does report a slight difference in sensation to light touch on the right face relative to the left.  No pronator drift.  Motor strength 5\5 upper and lower extremities.  Patient denies sensory differential on upper extremities.  She reports she chronically has some decreased sensation on the lower legs and feet and notes no difference to light touch.  Cognitive function normal.  Psychiatric:        Mood and Affect: Mood normal.      ED Treatments / Results  Labs (all labs ordered are  listed, but only abnormal results are displayed) Labs Reviewed  COMPREHENSIVE METABOLIC PANEL - Abnormal; Notable for the following components:      Result Value   Potassium 3.0 (*)    Glucose, Bld 221 (*)    BUN 22 (*)    Creatinine, Ser 1.58 (*)    Calcium 8.6 (*)    Albumin 3.2 (*)  AST 12 (*)    GFR calc non Af Amer 39 (*)    GFR calc Af Amer 45 (*)    All other components within normal limits  BRAIN NATRIURETIC PEPTIDE - Abnormal; Notable for the following components:   B Natriuretic Peptide 688.3 (*)    All other components within normal limits  TROPONIN I (HIGH SENSITIVITY) - Abnormal; Notable for the following components:   Troponin I (High Sensitivity) 26 (*)    All other components within normal limits  URINALYSIS, ROUTINE W REFLEX MICROSCOPIC - Abnormal; Notable for the following components:   APPearance HAZY (*)    Glucose, UA >=500 (*)    Hgb urine dipstick SMALL (*)    Protein, ur 100 (*)    Bacteria, UA FEW (*)    All other components within normal limits  RAPID URINE DRUG SCREEN, HOSP PERFORMED - Abnormal; Notable for the following components:   Tetrahydrocannabinol POSITIVE (*)    All other components within normal limits  SARS CORONAVIRUS 2 (HOSPITAL ORDER, [place] [facility])  CBC WITH DIFFERENTIAL/PLATELET  PROTIME-INR  TROPONIN I (HIGH SENSITIVITY)    EKG EKG Interpretation  Date/Time:  Monday February 11 2019 17:14:44 EDT Ventricular Rate:  84 PR Interval:    QRS Duration: 104 QT Interval:  425 QTC Calculation: 503 R Axis:   80 Text Interpretation:  Sinus rhythm Atrial premature complex Biatrial enlargement Borderline T abnormalities, lateral leads Minimal ST elevation, anterior leads Borderline prolonged QT interval no STEMI. Poor Rwve progression. minimal change as compared to previous Confirmed by Charlesetta Shanks [phone]) on 02/11/2019 8:05:53 PM   Radiology Mr Brain Wo Contrast (neuro Protocol)  Result Date:  02/11/2019 CLINICAL DATA:  Initial evaluation for acute right facial numbness. EXAM: MRI HEAD WITHOUT CONTRAST TECHNIQUE: Multiplanar, multiecho pulse sequences of the brain and surrounding structures were obtained without intravenous contrast. COMPARISON:  Previous brain MRI from 12/16/2005. FINDINGS: Brain: Cerebral volume within normal limits for age. Mild scattered subcentimeter T2/FLAIR hyperintensities seen involving the periventricular and deep white matter both cerebral hemispheres, nonspecific, but most like related chronic microvascular ischemic disease. Chronic microvascular ischemic changes present within the pons as well. Patchy small volume restricted diffusion seen involving the left lateral midbrain/pons (series 5, image 73), consistent with acute ischemic infarct. This measures approximately 1 cm in greatest dimension. No associated hemorrhage or mass effect. No other evidence for acute or subacute ischemia gray-white matter differentiation otherwise maintained. No other areas of chronic cortical infarction. No foci of susceptibility artifact to suggest acute or chronic intracranial hemorrhage. No mass lesion, midline shift or mass effect. No hydrocephalus. No extra-axial fluid collection. Pituitary gland suprasellar region within normal limits. Midline structures intact. Vascular: Major intracranial vascular flow voids are maintained. Skull and upper cervical spine: Craniocervical junction within normal limits. Bone marrow signal intensity diffusely decreased on T1 weighted imaging, most commonly related to anemia, smoking, or obesity. No focal marrow replacing lesion. No scalp soft tissue abnormality. Sinuses/Orbits: Globes and orbital soft tissues within normal limits. Mild scattered mucosal thickening throughout the paranasal sinuses. No air-fluid level to suggest acute sinusitis. No mastoid effusion. Inner ear structures normal. Other: None. IMPRESSION: 1. Patchy small volume acute ischemic  nonhemorrhagic infarct involving the left lateral midbrain/pons. 2. Underlying mild chronic microvascular ischemic disease. Electronically Signed   By: Jeannine Boga M.D.   On: 02/11/2019 19:28   Dg Chest Port 1 View  Result Date: 02/11/2019 CLINICAL DATA:  Hypertension. EXAM: PORTABLE CHEST 1 VIEW COMPARISON:  Radiograph 05/08/2006, no more recent exam. FINDINGS: Mild to moderate cardiomegaly. Normal mediastinal contours. Mild central bronchial thickening. No pulmonary edema, focal airspace disease, pleural effusion or pneumothorax. IMPRESSION: Mild to moderate cardiomegaly. Mild central bronchial thickening. Electronically Signed   By: Keith Rake M.D.   On: 02/11/2019 19:50    Procedures Procedures (including critical care time) CRITICAL CARE Performed by: Charlesetta Shanks   Total critical care time: 30 minutes  Critical care time was exclusive of separately billable procedures and treating other patients.  Critical care was necessary to treat or prevent imminent or life-threatening deterioration.  Critical care was time spent personally by me on the following activities: development of treatment plan with patient and/or surrogate as well as nursing, discussions with consultants, evaluation of patient's response to treatment, examination of patient, obtaining history from patient or surrogate, ordering and performing treatments and interventions, ordering and review of laboratory studies, ordering and review of radiographic studies, pulse oximetry and re-evaluation of patient's condition. Medications Ordered in ED Medications  amLODipine (NORVASC) tablet 10 mg (10 mg Oral Given 02/11/19 1758)  hydrALAZINE (APRESOLINE) injection 10 mg (10 mg Intravenous Given 02/11/19 1800)     Initial Impression / Assessment and Plan / ED Course  I have reviewed the triage vital signs and the nursing notes.  Pertinent labs & imaging results that were available during my care of the patient were  reviewed by me and considered in my medical decision making (see chart for details).  Clinical Course as of Feb 11 2020  Mon Feb 11, 2019  2009 Consult: Reviewed with Dr. Hal Hope for admission.   [MP]  2020 Consult: Reviewed with Dr. Lorraine Lax neurology, will see in consult for stroke deficit.  There is a target blood pressure of approximately 185 over 90s   [MP]    Clinical Course User Index [MP] Charlesetta Shanks, MD       Patient has hypertension that is poorly managed.  She has been off medications for at least several weeks.  Patient has developed several different symptoms.  But of particular note she got facial numbness that started yesterday.  She did not have associated motor or cognitive dysfunction.  CT does confirm small area of infarct.  She also has some developing cardiomegaly on chest x-ray and elevation of BNP.  Suspect hypertensive urgency, initial blood pressure 196/119.  Has been treated with her amlodipine and 10 mg of hydralazine with modest blood pressure provement to systolic 675/91.  At this time, with acute infarct will not initiate any additional IV blood pressure control ending consultation with neurology Final Clinical Impressions(s) / ED Diagnoses   Final diagnoses:  Cerebrovascular accident (CVA), unspecified mechanism [facility])  Hypertensive urgency    ED Discharge Orders    None       Charlesetta Shanks, MD 02/11/19 2018    Charlesetta Shanks, MD 02/11/19 2021

## 2019-02-12 ENCOUNTER — Other Ambulatory Visit: Payer: Self-pay

## 2019-02-12 ENCOUNTER — Observation Stay (HOSPITAL_COMMUNITY): Payer: Medicaid Other

## 2019-02-12 DIAGNOSIS — Z79899 Other long term (current) drug therapy: Secondary | ICD-10-CM | POA: Diagnosis not present

## 2019-02-12 DIAGNOSIS — I69398 Other sequelae of cerebral infarction: Secondary | ICD-10-CM | POA: Diagnosis not present

## 2019-02-12 DIAGNOSIS — G4733 Obstructive sleep apnea (adult) (pediatric): Secondary | ICD-10-CM | POA: Diagnosis present

## 2019-02-12 DIAGNOSIS — I69351 Hemiplegia and hemiparesis following cerebral infarction affecting right dominant side: Secondary | ICD-10-CM | POA: Diagnosis not present

## 2019-02-12 DIAGNOSIS — F17210 Nicotine dependence, cigarettes, uncomplicated: Secondary | ICD-10-CM | POA: Diagnosis present

## 2019-02-12 DIAGNOSIS — E1165 Type 2 diabetes mellitus with hyperglycemia: Secondary | ICD-10-CM | POA: Diagnosis present

## 2019-02-12 DIAGNOSIS — Z1159 Encounter for screening for other viral diseases: Secondary | ICD-10-CM | POA: Diagnosis not present

## 2019-02-12 DIAGNOSIS — E860 Dehydration: Secondary | ICD-10-CM | POA: Diagnosis present

## 2019-02-12 DIAGNOSIS — H538 Other visual disturbances: Secondary | ICD-10-CM | POA: Diagnosis present

## 2019-02-12 DIAGNOSIS — I10 Essential (primary) hypertension: Secondary | ICD-10-CM | POA: Diagnosis present

## 2019-02-12 DIAGNOSIS — Z9119 Patient's noncompliance with other medical treatment and regimen: Secondary | ICD-10-CM | POA: Diagnosis not present

## 2019-02-12 DIAGNOSIS — I161 Hypertensive emergency: Secondary | ICD-10-CM | POA: Diagnosis present

## 2019-02-12 DIAGNOSIS — N179 Acute kidney failure, unspecified: Secondary | ICD-10-CM | POA: Diagnosis present

## 2019-02-12 DIAGNOSIS — I6522 Occlusion and stenosis of left carotid artery: Secondary | ICD-10-CM | POA: Diagnosis present

## 2019-02-12 DIAGNOSIS — E1151 Type 2 diabetes mellitus with diabetic peripheral angiopathy without gangrene: Secondary | ICD-10-CM | POA: Diagnosis present

## 2019-02-12 DIAGNOSIS — I639 Cerebral infarction, unspecified: Secondary | ICD-10-CM | POA: Diagnosis not present

## 2019-02-12 DIAGNOSIS — F1210 Cannabis abuse, uncomplicated: Secondary | ICD-10-CM | POA: Diagnosis present

## 2019-02-12 DIAGNOSIS — E785 Hyperlipidemia, unspecified: Secondary | ICD-10-CM | POA: Diagnosis present

## 2019-02-12 DIAGNOSIS — E876 Hypokalemia: Secondary | ICD-10-CM | POA: Diagnosis present

## 2019-02-12 DIAGNOSIS — I160 Hypertensive urgency: Secondary | ICD-10-CM | POA: Diagnosis not present

## 2019-02-12 DIAGNOSIS — Z794 Long term (current) use of insulin: Secondary | ICD-10-CM | POA: Diagnosis not present

## 2019-02-12 DIAGNOSIS — I6381 Other cerebral infarction due to occlusion or stenosis of small artery: Secondary | ICD-10-CM | POA: Diagnosis not present

## 2019-02-12 DIAGNOSIS — Z6832 Body mass index (BMI) 32.0-32.9, adult: Secondary | ICD-10-CM | POA: Diagnosis not present

## 2019-02-12 DIAGNOSIS — E1140 Type 2 diabetes mellitus with diabetic neuropathy, unspecified: Secondary | ICD-10-CM | POA: Diagnosis present

## 2019-02-12 DIAGNOSIS — E1159 Type 2 diabetes mellitus with other circulatory complications: Secondary | ICD-10-CM | POA: Diagnosis not present

## 2019-02-12 DIAGNOSIS — Z9114 Patient's other noncompliance with medication regimen: Secondary | ICD-10-CM | POA: Diagnosis not present

## 2019-02-12 DIAGNOSIS — E669 Obesity, unspecified: Secondary | ICD-10-CM | POA: Diagnosis present

## 2019-02-12 DIAGNOSIS — R29700 NIHSS score 0: Secondary | ICD-10-CM | POA: Diagnosis present

## 2019-02-12 LAB — COMPREHENSIVE METABOLIC PANEL
ALT: 12 U/L (ref 0–44)
AST: 14 U/L — ABNORMAL LOW (ref 15–41)
Albumin: 3.3 g/dL — ABNORMAL LOW (ref 3.5–5.0)
Alkaline Phosphatase: 66 U/L (ref 38–126)
Anion gap: 14 (ref 5–15)
BUN: 16 mg/dL (ref 6–20)
CO2: 24 mmol/L (ref 22–32)
Calcium: 8.9 mg/dL (ref 8.9–10.3)
Chloride: 97 mmol/L — ABNORMAL LOW (ref 98–111)
Creatinine, Ser: 1.38 mg/dL — ABNORMAL HIGH (ref 0.44–1.00)
GFR calc Af Amer: 53 mL/min — ABNORMAL LOW (ref >60)
GFR calc non Af Amer: 46 mL/min — ABNORMAL LOW (ref >60)
Glucose, Bld: 260 mg/dL — ABNORMAL HIGH (ref 70–99)
Potassium: 3.2 mmol/L — ABNORMAL LOW (ref 3.5–5.1)
Sodium: 135 mmol/L (ref 135–145)
Total Bilirubin: 0.9 mg/dL (ref 0.3–1.2)
Total Protein: 7.2 g/dL (ref 6.5–8.1)

## 2019-02-12 LAB — GLUCOSE, CAPILLARY
Glucose-Capillary: 190 mg/dL — ABNORMAL HIGH (ref 70–99)
Glucose-Capillary: 203 mg/dL — ABNORMAL HIGH (ref 70–99)
Glucose-Capillary: 214 mg/dL — ABNORMAL HIGH (ref 70–99)
Glucose-Capillary: 225 mg/dL — ABNORMAL HIGH (ref 70–99)
Glucose-Capillary: 238 mg/dL — ABNORMAL HIGH (ref 70–99)
Glucose-Capillary: 238 mg/dL — ABNORMAL HIGH (ref 70–99)

## 2019-02-12 LAB — CBC
HCT: 37.3 % (ref 36.0–46.0)
Hemoglobin: 13.2 g/dL (ref 12.0–15.0)
MCH: 30.4 pg (ref 26.0–34.0)
MCHC: 35.4 g/dL (ref 30.0–36.0)
MCV: 85.9 fL (ref 80.0–100.0)
Platelets: 267 10*3/uL (ref 150–400)
RBC: 4.34 MIL/uL (ref 3.87–5.11)
RDW: 14.3 % (ref 11.5–15.5)
WBC: 10.3 10*3/uL (ref 4.0–10.5)
nRBC: 0 % (ref 0.0–0.2)

## 2019-02-12 LAB — LIPID PANEL
Cholesterol: 170 mg/dL (ref 0–200)
HDL: 35 mg/dL — ABNORMAL LOW (ref >40)
LDL Cholesterol: 58 mg/dL (ref 0–99)
Total CHOL/HDL Ratio: 4.9 RATIO
Triglycerides: 385 mg/dL — ABNORMAL HIGH (ref <150)
VLDL: 77 mg/dL — ABNORMAL HIGH (ref 0–40)

## 2019-02-12 LAB — ECHOCARDIOGRAM COMPLETE
Height: 69 in
Weight: 3492.09 oz

## 2019-02-12 LAB — HEMOGLOBIN A1C
Hgb A1c MFr Bld: 8.8 % — ABNORMAL HIGH (ref 4.8–5.6)
Mean Plasma Glucose: 205.86 mg/dL

## 2019-02-12 LAB — HIV ANTIBODY (ROUTINE TESTING W REFLEX): HIV Screen 4th Generation wRfx: NONREACTIVE

## 2019-02-12 LAB — MAGNESIUM: Magnesium: 1.4 mg/dL — ABNORMAL LOW (ref 1.7–2.4)

## 2019-02-12 MED ORDER — CLOPIDOGREL BISULFATE 75 MG PO TABS
75.0000 mg | ORAL_TABLET | Freq: Every day | ORAL | Status: DC
  Administered 2019-02-12 – 2019-02-13 (×2): 75 mg via ORAL
  Filled 2019-02-12 (×2): qty 1

## 2019-02-12 MED ORDER — ONDANSETRON HCL 4 MG/2ML IJ SOLN
4.0000 mg | Freq: Four times a day (QID) | INTRAMUSCULAR | Status: DC | PRN
  Administered 2019-02-12: 4 mg via INTRAVENOUS
  Filled 2019-02-12: qty 2

## 2019-02-12 MED ORDER — IOHEXOL 350 MG/ML SOLN
75.0000 mL | Freq: Once | INTRAVENOUS | Status: AC | PRN
  Administered 2019-02-12: 75 mL via INTRAVENOUS

## 2019-02-12 MED ORDER — ENOXAPARIN SODIUM 40 MG/0.4ML ~~LOC~~ SOLN
40.0000 mg | SUBCUTANEOUS | Status: DC
  Administered 2019-02-12 – 2019-02-13 (×2): 40 mg via SUBCUTANEOUS
  Filled 2019-02-12 (×2): qty 0.4

## 2019-02-12 MED ORDER — ACETAMINOPHEN 160 MG/5ML PO SOLN: 650.0000 mg | ORAL | Status: DC | PRN

## 2019-02-12 MED ORDER — ALBUTEROL SULFATE (2.5 MG/3ML) 0.083% IN NEBU: 3.0000 mL | INHALATION_SOLUTION | Freq: Four times a day (QID) | RESPIRATORY_TRACT | Status: DC | PRN

## 2019-02-12 MED ORDER — POTASSIUM CHLORIDE CRYS ER 20 MEQ PO TBCR
40.0000 meq | EXTENDED_RELEASE_TABLET | Freq: Once | ORAL | Status: AC
  Administered 2019-02-12: 40 meq via ORAL
  Filled 2019-02-12: qty 2

## 2019-02-12 MED ORDER — HYDRALAZINE HCL 20 MG/ML IJ SOLN
5.0000 mg | INTRAMUSCULAR | Status: DC | PRN
  Administered 2019-02-12 – 2019-02-13 (×2): 5 mg via INTRAVENOUS
  Filled 2019-02-12 (×2): qty 1

## 2019-02-12 MED ORDER — ASPIRIN 300 MG RE SUPP: 300.0000 mg | Freq: Every day | RECTAL | Status: DC

## 2019-02-12 MED ORDER — INSULIN GLARGINE 100 UNIT/ML ~~LOC~~ SOLN
10.0000 [IU] | Freq: Two times a day (BID) | SUBCUTANEOUS | Status: DC
  Administered 2019-02-12 – 2019-02-13 (×3): 10 [IU] via SUBCUTANEOUS
  Filled 2019-02-12 (×4): qty 0.1

## 2019-02-12 MED ORDER — ACETAMINOPHEN 325 MG PO TABS: 650.0000 mg | ORAL_TABLET | ORAL | Status: DC | PRN

## 2019-02-12 MED ORDER — AMLODIPINE BESYLATE 10 MG PO TABS
10.0000 mg | ORAL_TABLET | Freq: Every day | ORAL | Status: DC
  Administered 2019-02-12 – 2019-02-13 (×2): 10 mg via ORAL
  Filled 2019-02-12 (×2): qty 1

## 2019-02-12 MED ORDER — INSULIN GLARGINE 100 UNIT/ML ~~LOC~~ SOLN
10.0000 [IU] | Freq: Every day | SUBCUTANEOUS | Status: DC
  Administered 2019-02-12: 10 [IU] via SUBCUTANEOUS
  Filled 2019-02-12: qty 0.1

## 2019-02-12 MED ORDER — STROKE: EARLY STAGES OF RECOVERY BOOK
Freq: Once | Status: AC
  Administered 2019-02-12: 01:00:00
  Filled 2019-02-12: qty 1

## 2019-02-12 MED ORDER — KCL-LACTATED RINGERS 20 MEQ/L IV SOLN: INTRAVENOUS | Status: DC

## 2019-02-12 MED ORDER — ATORVASTATIN CALCIUM 80 MG PO TABS: 80.0000 mg | ORAL_TABLET | Freq: Every day | ORAL | Status: DC

## 2019-02-12 MED ORDER — ASPIRIN 325 MG PO TABS
325.0000 mg | ORAL_TABLET | Freq: Every day | ORAL | Status: DC
  Administered 2019-02-12: 325 mg via ORAL
  Filled 2019-02-12: qty 1

## 2019-02-12 MED ORDER — INSULIN ASPART 100 UNIT/ML ~~LOC~~ SOLN
0.0000 [IU] | Freq: Three times a day (TID) | SUBCUTANEOUS | Status: DC
  Administered 2019-02-12 (×3): 3 [IU] via SUBCUTANEOUS
  Administered 2019-02-13 (×2): 2 [IU] via SUBCUTANEOUS

## 2019-02-12 MED ORDER — ASPIRIN EC 81 MG PO TBEC
81.0000 mg | DELAYED_RELEASE_TABLET | Freq: Every day | ORAL | Status: DC
  Administered 2019-02-13: 81 mg via ORAL
  Filled 2019-02-12: qty 1

## 2019-02-12 MED ORDER — ACETAMINOPHEN 650 MG RE SUPP: 650.0000 mg | RECTAL | Status: DC | PRN

## 2019-02-12 MED ORDER — SODIUM CHLORIDE 0.9 % IV SOLN
INTRAVENOUS | Status: DC
  Administered 2019-02-12: 01:00:00 via INTRAVENOUS

## 2019-02-12 MED ORDER — ATORVASTATIN CALCIUM 40 MG PO TABS
40.0000 mg | ORAL_TABLET | Freq: Every day | ORAL | Status: DC
  Administered 2019-02-12: 40 mg via ORAL
  Filled 2019-02-12: qty 1

## 2019-02-12 NOTE — Progress Notes (Signed)
Rehab Admissions Coordinator Note:  Patient was screened by Cleatrice Burke for appropriateness for an Inpatient Acute Rehab Consult per PT recs.  At this time, we are recommending Inpatient Rehab consult.  Cleatrice Burke RN MSN 02/12/2019, 12:19 PM  I can be reached at [phone].

## 2019-02-12 NOTE — Progress Notes (Signed)
Inpatient Rehabilitation Admissions Coordinator  Inpatient Rehab Consult received. I met with patient at the bedside for rehabilitation assessment. I also spoke with OT after her eval. Patient progressing well with therapy. We discussed goals and expectations of an inpatient rehab admission.  Patient lives at home with her 62,41 and 47 year old children. She is undecided if she would like to remain for 3 to 5 days of more intensive rehab and I am undecided if she would need more intensive rehab before d/c home with her children. Patient would like to discuss with family and I will follow up tomorrow if pt remains in house. Patient states doctor has told her that she may be ready fore d/c tonight.  Danne Baxter, RN, MSN Rehab Admissions Coordinator [phone] 02/12/2019 3:18 PM

## 2019-02-12 NOTE — Progress Notes (Signed)
  Echocardiogram 2D Echocardiogram has been performed.  [name] 02/12/2019, 11:15 AM

## 2019-02-12 NOTE — Progress Notes (Signed)
STROKE TEAM PROGRESS NOTE   INTERVAL HISTORY I have reviewed history of presenting illness in details with the patient as well as electronic medical records and imaging films in PACS.  She presented with right facial numbness which is improving but not recovered fully yet.  MRI does show small left pontine lacunar infarct.  CT angiogram showed only mild atherosclerotic changes without large vessel stenosis or occlusion.  Hemoglobin A1c is elevated at 8.8 and LDL cholesterol is 58 mg percent.  Echocardiogram results are pending.  Patient does admit to smoking marijuana regularly and not being compliant with CPAP  Vitals:   02/12/19 0415 02/12/19 0600 02/12/19 0853 02/12/19 1218  BP: (!) 190/102 (!) 173/84 (!) 179/88 (!) 169/90  Pulse: 92 94 97 86  Resp: 18 18 19 20   Temp: 97.9 F (36.6 C) 98.2 F (36.8 C) 97.8 F (36.6 C) 98.2 F (36.8 C)  TempSrc: Oral Oral Oral Oral  SpO2: 100% 100% 100% 100%  Weight:      Height:        CBC:  Recent Labs  Lab 02/11/19 1716 02/12/19 0537  WBC 9.0 10.3  NEUTROABS 5.6  --   HGB 12.6 13.2  HCT 36.0 37.3  MCV 87.4 85.9  PLT 265 622    Basic Metabolic Panel:  Recent Labs  Lab 02/11/19 1716 02/12/19 0537 02/12/19 1016  NA 137 135  --   K 3.0* 3.2*  --   CL 100 97*  --   CO2 26 24  --   GLUCOSE 221* 260*  --   BUN 22* 16  --   CREATININE 1.58* 1.38*  --   CALCIUM 8.6* 8.9  --   MG  --   --  1.4*   Lipid Panel:     Component Value Date/Time   CHOL 170 02/12/2019 0537   TRIG 385 (H) 02/12/2019 0537   HDL 35 (L) 02/12/2019 0537   CHOLHDL 4.9 02/12/2019 0537   VLDL 77 (H) 02/12/2019 0537   LDLCALC 58 02/12/2019 0537   HgbA1c:  Lab Results  Component Value Date   HGBA1C 8.8 (H) 02/12/2019   Urine Drug Screen:     Component Value Date/Time   LABOPIA NONE DETECTED 02/11/2019 1716   COCAINSCRNUR NONE DETECTED 02/11/2019 1716   LABBENZ NONE DETECTED 02/11/2019 1716   AMPHETMU NONE DETECTED 02/11/2019 1716   THCU POSITIVE (A)  02/11/2019 1716   LABBARB NONE DETECTED 02/11/2019 1716    Alcohol Level No results found for: ETH  IMAGING Ct Angio Head W Or Wo Contrast  Result Date: 02/12/2019 CLINICAL DATA:  Right facial numbness. Blurred vision in the right eye. EXAM: CT ANGIOGRAPHY HEAD AND NECK TECHNIQUE: Multidetector CT imaging of the head and neck was performed using the standard protocol during bolus administration of intravenous contrast. Multiplanar CT image reconstructions and MIPs were obtained to evaluate the vascular anatomy. Carotid stenosis measurements (when applicable) are obtained utilizing NASCET criteria, using the distal internal carotid diameter as the denominator. CONTRAST:  10mL OMNIPAQUE IOHEXOL 350 MG/ML SOLN COMPARISON:  MRI brain 02/11/2019 FINDINGS: CT HEAD FINDINGS Brain: Noncontrast images demonstrate no acute intracranial abnormality. The subtle findings along the posterolateral aspect of the left pons are not discernible by CT. Ventricles are of normal size. Persistent cavum septum pellucidum is again noted. No significant extraaxial fluid collection is present. Brainstem and cerebellum are otherwise unremarkable. Vascular: No hyperdense vessel or unexpected calcification. Skull: Calvarium is intact. No focal lytic or blastic lesions are present. Sinuses:  The paranasal sinuses and mastoid air cells are clear. Orbits: The globes and orbits are within normal limits. Mild prominence of the lacrimal glands bilaterally is likely within normal limits. Review of the MIP images confirms the above findings CTA NECK FINDINGS Aortic arch: A 3 vessel arch configuration is present. There is no significant stenosis or vascular calcification at the aortic arch. Right carotid system: The right common carotid artery is within normal limits. The right carotid bifurcation is normal. There is mild tortuosity of the cervical right ICA without a significant stenosis. Left carotid system: The left common carotid artery is  within normal limits. The left carotid bifurcation is unremarkable. There is mild tortuosity of the cervical left ICA without a significant stenosis. Vertebral arteries: The vertebral arteries both originate from the subclavian arteries. There is no significant stenosis at the origin of either vertebral artery. The vertebral arteries are codominant. There is moderate tortuosity in the proximal left vertebral artery just after the origin without significant stenosis. There is moderate tortuosity of both vertebral arteries in the neck without significant stenosis. Skeleton: There straightening of the normal cervical lordosis. Vertebral body heights and alignment are maintained. No focal lytic or blastic lesions are present. Other neck: The soft tissues the neck are otherwise within normal limits. Salivary glands are unremarkable. No focal mucosal or submucosal lesions are present. A 6 mm hypodense nodule is noted posteriorly in the left lobe of the thyroid. No follow-up is necessary. No significant adenopathy is present. Upper chest: The lung apices are clear. Review of the MIP images confirms the above findings CTA HEAD FINDINGS Anterior circulation: Atherosclerotic irregularity is present within the cavernous internal carotid arteries bilaterally. There is no significant stenosis relative to the more distal vessel scratched at there is no significant stenosis of greater than 50% relative to the more distant vessel. There is diffuse vessel irregularity in the right M1 segment. The A1 segments are within normal limits. Anterior communicating artery is patent. MCA bifurcations are intact. The distal small vessel segmental irregularity is present in the ACA and MCA branch vessels bilaterally without a significant proximal stenosis or occlusion. Posterior circulation: The vertebral arteries are codominant. PICA origins are visualized and normal. The vertebrobasilar junction is normal. The basilar artery is within normal  limits. Both posterior cerebral arteries originate from the basilar tip. There is mild narrowing at the origin of the left PCA. There is diffuse distal branch vessel irregularity involving PCA branches bilaterally without a significant proximal stenosis or occlusion. Venous sinuses: The dural sinuses are patent. The straight sinus and deep cerebral veins are intact. Cortical veins are unremarkable. Anatomic variants: None Review of the MIP images confirms the above findings IMPRESSION: 1. Diffuse distal branch vessel irregularity throughout the circle-of-Willis without a significant proximal stenosis or occlusion. 2. Atherosclerotic changes are evident within the cavernous internal carotid arteries and right greater than left M1 segments with diffuse vessel irregularity and mild narrowing of less than 50% relative to the more distal vessels. 3. Tortuosity of the cervical internal carotid arteries and vertebral arteries bilaterally without significant stenosis. This is nonspecific, but often seen in the setting of chronic hypertension. 4. Brainstem infarct and white matter parenchymal changes noted on MRI are not discernible by CT. Electronically Signed   By: San Morelle M.D.   On: 02/12/2019 04:44   Ct Angio Neck W Or Wo Contrast  Result Date: 02/12/2019 CLINICAL DATA:  Right facial numbness. Blurred vision in the right eye. EXAM: CT ANGIOGRAPHY HEAD AND  NECK TECHNIQUE: Multidetector CT imaging of the head and neck was performed using the standard protocol during bolus administration of intravenous contrast. Multiplanar CT image reconstructions and MIPs were obtained to evaluate the vascular anatomy. Carotid stenosis measurements (when applicable) are obtained utilizing NASCET criteria, using the distal internal carotid diameter as the denominator. CONTRAST:  57mL OMNIPAQUE IOHEXOL 350 MG/ML SOLN COMPARISON:  MRI brain 02/11/2019 FINDINGS: CT HEAD FINDINGS Brain: Noncontrast images demonstrate no acute  intracranial abnormality. The subtle findings along the posterolateral aspect of the left pons are not discernible by CT. Ventricles are of normal size. Persistent cavum septum pellucidum is again noted. No significant extraaxial fluid collection is present. Brainstem and cerebellum are otherwise unremarkable. Vascular: No hyperdense vessel or unexpected calcification. Skull: Calvarium is intact. No focal lytic or blastic lesions are present. Sinuses: The paranasal sinuses and mastoid air cells are clear. Orbits: The globes and orbits are within normal limits. Mild prominence of the lacrimal glands bilaterally is likely within normal limits. Review of the MIP images confirms the above findings CTA NECK FINDINGS Aortic arch: A 3 vessel arch configuration is present. There is no significant stenosis or vascular calcification at the aortic arch. Right carotid system: The right common carotid artery is within normal limits. The right carotid bifurcation is normal. There is mild tortuosity of the cervical right ICA without a significant stenosis. Left carotid system: The left common carotid artery is within normal limits. The left carotid bifurcation is unremarkable. There is mild tortuosity of the cervical left ICA without a significant stenosis. Vertebral arteries: The vertebral arteries both originate from the subclavian arteries. There is no significant stenosis at the origin of either vertebral artery. The vertebral arteries are codominant. There is moderate tortuosity in the proximal left vertebral artery just after the origin without significant stenosis. There is moderate tortuosity of both vertebral arteries in the neck without significant stenosis. Skeleton: There straightening of the normal cervical lordosis. Vertebral body heights and alignment are maintained. No focal lytic or blastic lesions are present. Other neck: The soft tissues the neck are otherwise within normal limits. Salivary glands are  unremarkable. No focal mucosal or submucosal lesions are present. A 6 mm hypodense nodule is noted posteriorly in the left lobe of the thyroid. No follow-up is necessary. No significant adenopathy is present. Upper chest: The lung apices are clear. Review of the MIP images confirms the above findings CTA HEAD FINDINGS Anterior circulation: Atherosclerotic irregularity is present within the cavernous internal carotid arteries bilaterally. There is no significant stenosis relative to the more distal vessel scratched at there is no significant stenosis of greater than 50% relative to the more distant vessel. There is diffuse vessel irregularity in the right M1 segment. The A1 segments are within normal limits. Anterior communicating artery is patent. MCA bifurcations are intact. The distal small vessel segmental irregularity is present in the ACA and MCA branch vessels bilaterally without a significant proximal stenosis or occlusion. Posterior circulation: The vertebral arteries are codominant. PICA origins are visualized and normal. The vertebrobasilar junction is normal. The basilar artery is within normal limits. Both posterior cerebral arteries originate from the basilar tip. There is mild narrowing at the origin of the left PCA. There is diffuse distal branch vessel irregularity involving PCA branches bilaterally without a significant proximal stenosis or occlusion. Venous sinuses: The dural sinuses are patent. The straight sinus and deep cerebral veins are intact. Cortical veins are unremarkable. Anatomic variants: None Review of the MIP images confirms the above  findings IMPRESSION: 1. Diffuse distal branch vessel irregularity throughout the circle-of-Willis without a significant proximal stenosis or occlusion. 2. Atherosclerotic changes are evident within the cavernous internal carotid arteries and right greater than left M1 segments with diffuse vessel irregularity and mild narrowing of less than 50% relative  to the more distal vessels. 3. Tortuosity of the cervical internal carotid arteries and vertebral arteries bilaterally without significant stenosis. This is nonspecific, but often seen in the setting of chronic hypertension. 4. Brainstem infarct and white matter parenchymal changes noted on MRI are not discernible by CT. Electronically Signed   By: San Morelle M.D.   On: 02/12/2019 04:44   Mr Brain Wo Contrast (neuro Protocol)  Result Date: 02/11/2019 CLINICAL DATA:  Initial evaluation for acute right facial numbness. EXAM: MRI HEAD WITHOUT CONTRAST TECHNIQUE: Multiplanar, multiecho pulse sequences of the brain and surrounding structures were obtained without intravenous contrast. COMPARISON:  Previous brain MRI from 12/16/2005. FINDINGS: Brain: Cerebral volume within normal limits for age. Mild scattered subcentimeter T2/FLAIR hyperintensities seen involving the periventricular and deep white matter both cerebral hemispheres, nonspecific, but most like related chronic microvascular ischemic disease. Chronic microvascular ischemic changes present within the pons as well. Patchy small volume restricted diffusion seen involving the left lateral midbrain/pons (series 5, image 73), consistent with acute ischemic infarct. This measures approximately 1 cm in greatest dimension. No associated hemorrhage or mass effect. No other evidence for acute or subacute ischemia gray-white matter differentiation otherwise maintained. No other areas of chronic cortical infarction. No foci of susceptibility artifact to suggest acute or chronic intracranial hemorrhage. No mass lesion, midline shift or mass effect. No hydrocephalus. No extra-axial fluid collection. Pituitary gland suprasellar region within normal limits. Midline structures intact. Vascular: Major intracranial vascular flow voids are maintained. Skull and upper cervical spine: Craniocervical junction within normal limits. Bone marrow signal intensity diffusely  decreased on T1 weighted imaging, most commonly related to anemia, smoking, or obesity. No focal marrow replacing lesion. No scalp soft tissue abnormality. Sinuses/Orbits: Globes and orbital soft tissues within normal limits. Mild scattered mucosal thickening throughout the paranasal sinuses. No air-fluid level to suggest acute sinusitis. No mastoid effusion. Inner ear structures normal. Other: None. IMPRESSION: 1. Patchy small volume acute ischemic nonhemorrhagic infarct involving the left lateral midbrain/pons. 2. Underlying mild chronic microvascular ischemic disease. Electronically Signed   By: Jeannine Boga M.D.   On: 02/11/2019 19:28   Dg Chest Port 1 View  Result Date: 02/11/2019 CLINICAL DATA:  Hypertension. EXAM: PORTABLE CHEST 1 VIEW COMPARISON:  Radiograph 05/08/2006, no more recent exam. FINDINGS: Mild to moderate cardiomegaly. Normal mediastinal contours. Mild central bronchial thickening. No pulmonary edema, focal airspace disease, pleural effusion or pneumothorax. IMPRESSION: Mild to moderate cardiomegaly. Mild central bronchial thickening. Electronically Signed   By: Keith Rake M.D.   On: 02/11/2019 19:50    PHYSICAL EXAM Pleasant middle-aged African-American lady not in distress. . Afebrile. Head is nontraumatic. Neck is supple without bruit.    Cardiac exam no murmur or gallop. Lungs are clear to auscultation. Distal pulses are well felt. Neurological Exam ;  Awake  Alert oriented x 3. Normal speech and language.eye movements full without nystagmus.fundi were not visualized. Vision acuity and fields appear normal. Hearing is normal. Palatal movements are normal. Face symmetric. Tongue midline. Normal strength, tone, reflexes and coordination. Normal sensation except for diminished touch pinprick on the right lower half of the face.. Gait deferred.  ASSESSMENT/PLAN Ms. AZHANE ECKART is a 48 y.o. female with history of HTN,  diabetes, obesity, serum tumor cerebri,  medication noncompliance x2 months presenting with right facial numbness x2 days.   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  Underlying small vessel disease  CTA head & neck diffuse distal branch irregularity throughout COW.  R>L ICA atherosclerosis < 50%.  Tortuosity of vessels.  Brainstem infarct and white matter changes.  2D Echo 60 to 65% ejection fraction.  No cardiac source of embolism.  LDL 58  HgbA1c 8.8  Lovenox 40 mg subcu daily for VTE prophylaxis  No antithrombotic prior to admission, now on aspirin 325 mg daily. Given mild stroke, recommend aspirin 81 mg and plavix 75 mg daily x 3 weeks, then aspirin alone. Orders adjusted.   Therapy recommendations: CIR.  Consult in place.  Disposition: Pending  Hypertensive Urgency  Systolic blood pressure greater than 200 on admission   Elevated but stable, 170s to 190s . Permissive hypertension (OK if < 220/120) but gradually normalize in 5-7 days . Long-term BP goal normotensive  Hyperlipidemia  Home meds: No statin  Now on Lipitor 80  LDL 58, at goal < 70  We will decrease Lipitor to 40  Continue statin at discharge and adjust dose as outpatient as appropriate  Diabetes type II Uncontrolled  HgbA1c 8.8, goal < 7.0  Other Stroke Risk Factors  Cigarette smoker, advised to stop smoking  Former smokeless tobacco user, quit 2 years ago  ETOH use, advised to drink no more than 1 drink(s) a day  Current marijuana user.  Urine drug screen positive for THC this admission  Obesity, Body mass index is 32.23 kg/m., recommend weight loss, diet and exercise as appropriate   Obstructive sleep apnea, noncompliant with CPAP at home because she does not like  Other Active Problems  Pseudotumor cerebra  Hypokalemia  Acute renal failure  Hospital day # 0  I have personally obtained history,examined this patient, reviewed notes,  independently viewed imaging studies, participated in medical decision making and plan of care.ROS completed by me personally and pertinent positives fully documented  I have made any additions or clarifications directly to the above note.  She presented with right face paresthesia secondary to small lacunar pontine infarct from small vessel disease.  I have counseled her to quit smoking marijuana as well as to be compliant with using her CPAP.  Recommend aspirin and Plavix for 3 weeks followed by aspirin alone and aggressive risk factor modification.  Follow-up as an outpatient in the stroke clinic in 6 weeks.  Discussed with Dr. Lucianne Lei and answered questions.  Greater than 50% time during this 25-minute visit was spent on counseling and coordination of care about her lacunar stroke and answering questions.  Antony Contras, MD Medical Director [facility] Pager: [phone] 02/12/2019 4:02 PM   To contact Stroke Continuity provider, please refer to [email or web address] After hours, contact General Neurology

## 2019-02-12 NOTE — Progress Notes (Signed)
  Speech Language Pathology Treatment: Cognitive-Linquistic(Aphasia )  Patient Details Name: [name] MRN: [id number] DOB: [birth year] Today's Date: 02/12/2019 Time: 3009-2330 SLP Time Calculation (min) (ACUTE ONLY): 21 min  Assessment / Plan / Recommendation Clinical Impression  Pt was educated regarding her deficits, goals, and the plan of care. She verbalized understanding and agreement. She became tearful during moments of frustration when she needed repetition, additional processing time, or "couldn't remember" information. She was consoled by the SLP and educated regarding the progression of therapy as well as her positive prognostic indicators. She was in good spirits at the end of the session. She provided 6-11 items per concrete category during divergent naming tasks with an average of 9 items per categiory. She achieved 60% accuracy with auditory comprehension of paragraphs increasing to 100% accuracy with min-mod cues. She demonstrated 80% accuracy with responsive naming increasing to 100% with min. cues. SLP will continue to follow pt.    HPI HPI: Pt is a 48 y.o. female with history of hypertension, diabetes mellitus who presented to the ED with complaints of right facial numbness which happened almost 48 hours prior and the pt thought was an allergic reaction to lobster. MRI of the brain revealed patchy small volume acute ischemic nonhemorrhagic infarct involving the left lateral midbrain/pons.      SLP Plan  Continue with current plan of care  Patient needs continued Speech Lanaguage Pathology Services    Recommendations                   Follow up Recommendations: Other (comment)(Continued SLP services at level of care recommended by PT/OT) SLP Visit Diagnosis: Aphasia (R47.01) Plan: Continue with current plan of care       [name] I. Hardin Negus, [place], [place] Office number [phone] Pager [place] 02/12/2019, 10:28 AM

## 2019-02-12 NOTE — Consult Note (Signed)
Requesting Physician: Dr. Hal Hope    Chief Complaint: Right facial numbness  History obtained from: Patient and Chart     HPI:                                                                                                                                       [name] is a 48 y.o. female with past medical history of diabetes mellitus, hypertension, pseudotumor cerebri, obesity noncompliant with medications presents to the emergency room with right facial numbness that began on Sunday.  Patient states that her symptoms began on Saturday when she was not feeling well.  She was complaining of headache, nausea and missed family member's birthday.  On Sunday around 4:00 patient noticed and numbness over the right side of her face which she describes like going to the dentist.  She denies any weakness in the arm and leg.  No slurred speech, difficulty getting words out.  She denies double vision however states that she has blurry vision in both eyes.  Blood pressure on arrival was significantly elevated greater than 182 systolic.  Patient admits to not taking her medications for the last 2 months.  Date last known well: 7.5.20 tPA Given: No, outside window NIHSS: 0 Baseline MRS 0   Past Medical History:  Diagnosis Date  . Arthritis   . Diabetes mellitus   . Hypertension   . Pseudotumor cerebri     Past Surgical History:  Procedure Laterality Date  . ACHILLES TENDON REPAIR      Family History  Problem Relation Age of Onset  . Hypertension Father   . Kidney disease Father   . Asthma Other   . Hyperlipidemia Other   . Hypertension Other   . Cancer Other    Social History:  reports that she has been smoking cigarettes. She has been smoking about 0.50 packs per day. She quit smokeless tobacco use about 2 years ago. She reports current alcohol use. She reports current drug use. Drug: Marijuana.  Allergies: No Known Allergies  Medications:                                                                                                                         I reviewed home medications. Not compliant    ROS:  14 systems reviewed and negative except above   Examination:                                                                                                      General: Appears well-developed  Psych: Affect appropriate to situation Eyes: No scleral injection HENT: No OP obstrucion Head: Normocephalic.  Cardiovascular: Normal rate and regular rhythm.  Respiratory: Effort normal and breath sounds normal to anterior ascultation GI: Soft.  No distension. There is no tenderness.  Skin: WDI    Neurological Examination Mental Status: Alert, oriented, thought content appropriate.  Speech fluent without evidence of aphasia. Able to follow 3 step commands without difficulty. Cranial Nerves: II: Visual fields grossly normal,  III,IV, VI: ptosis not present, extra-ocular motions intact bilaterally, pupils equal, round, reactive to light and accommodation V,VII: smile symmetric, paresthesia right side of face VIII: hearing normal bilaterally IX,X: uvula rises symmetrically XI: bilateral shoulder shrug XII: midline tongue extension Motor: Right : Upper extremity   5/5    Left:     Upper extremity   5/5  Lower extremity   5/5     Lower extremity   5/5 Tone and bulk:normal tone throughout; no atrophy noted Sensory: Pinprick and light touch intact throughout, bilaterally.  Subjective paresthesia over the right side of her face. Deep Tendon Reflexes: 2+ and symmetric throughout Plantars: Right: downgoing   Left: downgoing Cerebellar: normal finger-to-nose, normal rapid alternating movements and normal heel-to-shin test Gait: normal gait and station     Lab Results: Basic Metabolic Panel: Recent Labs  Lab 02/11/19 1716  NA  137  K 3.0*  CL 100  CO2 26  GLUCOSE 221*  BUN 22*  CREATININE 1.58*  CALCIUM 8.6*    CBC: Recent Labs  Lab 02/11/19 1716  WBC 9.0  NEUTROABS 5.6  HGB 12.6  HCT 36.0  MCV 87.4  PLT 265    Coagulation Studies: Recent Labs    02/11/19 1716  LABPROT 12.9  INR 1.0    Imaging: Mr Brain Wo Contrast (neuro Protocol)  Result Date: 02/11/2019 CLINICAL DATA:  Initial evaluation for acute right facial numbness. EXAM: MRI HEAD WITHOUT CONTRAST TECHNIQUE: Multiplanar, multiecho pulse sequences of the brain and surrounding structures were obtained without intravenous contrast. COMPARISON:  Previous brain MRI from 12/16/2005. FINDINGS: Brain: Cerebral volume within normal limits for age. Mild scattered subcentimeter T2/FLAIR hyperintensities seen involving the periventricular and deep white matter both cerebral hemispheres, nonspecific, but most like related chronic microvascular ischemic disease. Chronic microvascular ischemic changes present within the pons as well. Patchy small volume restricted diffusion seen involving the left lateral midbrain/pons (series 5, image 73), consistent with acute ischemic infarct. This measures approximately 1 cm in greatest dimension. No associated hemorrhage or mass effect. No other evidence for acute or subacute ischemia gray-white matter differentiation otherwise maintained. No other areas of chronic cortical infarction. No foci of susceptibility artifact to suggest acute or chronic intracranial hemorrhage. No mass lesion, midline shift or mass effect. No hydrocephalus. No extra-axial fluid collection. Pituitary gland suprasellar region within normal limits. Midline structures intact.  Vascular: Major intracranial vascular flow voids are maintained. Skull and upper cervical spine: Craniocervical junction within normal limits. Bone marrow signal intensity diffusely decreased on T1 weighted imaging, most commonly related to anemia, smoking, or obesity. No focal  marrow replacing lesion. No scalp soft tissue abnormality. Sinuses/Orbits: Globes and orbital soft tissues within normal limits. Mild scattered mucosal thickening throughout the paranasal sinuses. No air-fluid level to suggest acute sinusitis. No mastoid effusion. Inner ear structures normal. Other: None. IMPRESSION: 1. Patchy small volume acute ischemic nonhemorrhagic infarct involving the left lateral midbrain/pons. 2. Underlying mild chronic microvascular ischemic disease. Electronically Signed   By: Jeannine Boga M.D.   On: 02/11/2019 19:28   Dg Chest Port 1 View  Result Date: 02/11/2019 CLINICAL DATA:  Hypertension. EXAM: PORTABLE CHEST 1 VIEW COMPARISON:  Radiograph 05/08/2006, no more recent exam. FINDINGS: Mild to moderate cardiomegaly. Normal mediastinal contours. Mild central bronchial thickening. No pulmonary edema, focal airspace disease, pleural effusion or pneumothorax. IMPRESSION: Mild to moderate cardiomegaly. Mild central bronchial thickening. Electronically Signed   By: Keith Rake M.D.   On: 02/11/2019 19:50     ASSESSMENT AND PLAN   Acute Ischemic Stroke - Midbrain/pontine infarctions  Risk factors : Uncontrolled hypertension, diabetes, tobacco abuse, obesity Etiology: Likely small vessel disease  Recommend #CTA head and neck #Transthoracic Echo  # Start patient on ASA 325mg  daily #Start or continue Atorvastatin 80 mg/other high intensity statin # BP goal: permissive HTN upto 185/110 mmHg # HBAIC and Lipid profile # Telemetry monitoring # Frequent neuro checks # NPO until passes stroke swallow screen  Please page stroke NP  Or  PA  Or MD from 8am -4 pm  as this patient from this time will be  followed by the stroke.   You can look them up on [email or web address]  Password [facility]   Ramsey Guadamuz [facility] Pager Number [phone]

## 2019-02-12 NOTE — Progress Notes (Signed)
Progress Note    [name]  RSW:[id number] DOB: 1971/03/13  DOA: 02/11/2019 PCP: [name], MD    Brief Narrative:     Medical records reviewed and are as summarized below:  [name] is an 48 y.o. female with history of hypertension, diabetes mellitus presents to the ER with complaints of right facial numbness which happened almost 48 hours prior to presenting to the ER.  MRI showed CVA.    Assessment/Plan:   Active Problems:   Obstructive sleep apnea   Acute CVA (cerebrovascular accident) ([place])   Hypertensive urgency   ARF (acute renal failure) ([place])   Type 2 diabetes mellitus with vascular disease ([place])  Acute CVA - -neurology consult appreciated -CTA head/neck -echo -PT/OT  -LDL: 58 -HgbA1c: 8.8  hypertensive urgency  -neurologist has recommended to keep the blood pressure systolic 093 and diastolic 818.  -started back patient on amlodipine and PRN IV hydralazine for now -hold lisinopril/HCTZ  Hypokalemia  -appears to be chronic -on HCTZ at home - Replace -check Mg  Diabetes mellitus type 2 uncontrolled with hyperglycemia -HgbA1c: 8.8 -needs better control -appears to be on 70/30 at home with glucotrol -lantus and SSI while here  Acute renal failure -improved with IVF  Sleep apnea on CPAP -Has not been using for last 1 month.  Marijuana abuse  -encourage cessation  History of pseudotumor cerebri  -per the chart.  obesity Body mass index is 32.23 kg/m.   Family Communication/Anticipated D/C date and plan/Code Status   DVT prophylaxis: Lovenox ordered. Code Status: Full Code.  Family Communication:  Disposition Plan:    Medical Consultants:    Neurology  Subjective:   C/o nausea today  Objective:    Vitals:   02/12/19 0200 02/12/19 0415 02/12/19 0600 02/12/19 0853  BP: (!) 189/104 (!) 190/102 (!) 173/84 (!) 179/88  Pulse: 86 92 94 97  Resp: 18 18 18 19   Temp: 98 F (36.7 C) 97.9 F (36.6 C) 98.2 F  (36.8 C) 97.8 F (36.6 C)  TempSrc: Axillary Oral Oral Oral  SpO2: 100% 100% 100% 100%  Weight:      Height:        Intake/Output Summary (Last 24 hours) at 02/12/2019 1009 Last data filed at 02/12/2019 0854 Gross per 24 hour  Intake 316.4 ml  Output --  Net 316.4 ml   Filed Weights   02/12/19 0000  Weight: 99 kg    Exam: In bed, NAD- talking on cell phone rrr No increased work of breathing Min LE edema A+Ox3  Data Reviewed:   I have personally reviewed following labs and imaging studies:  Labs: Labs show the following:   Basic Metabolic Panel: Recent Labs  Lab 02/11/19 1716 02/12/19 0537  NA 137 135  K 3.0* 3.2*  CL 100 97*  CO2 26 24  GLUCOSE 221* 260*  BUN 22* 16  CREATININE 1.58* 1.38*  CALCIUM 8.6* 8.9   GFR Estimated Creatinine Clearance: 63.8 mL/min (A) (by C-G formula based on SCr of 1.38 mg/dL (H)). Liver Function Tests: Recent Labs  Lab 02/11/19 1716 02/12/19 0537  AST 12* 14*  ALT 13 12  ALKPHOS 62 66  BILITOT 0.5 0.9  PROT 7.2 7.2  ALBUMIN 3.2* 3.3*   No results for input(s): LIPASE, AMYLASE in the last 168 hours. No results for input(s): AMMONIA in the last 168 hours. Coagulation profile Recent Labs  Lab 02/11/19 1716  INR 1.0    CBC: Recent Labs  Lab 02/11/19  1716 02/12/19 0537  WBC 9.0 10.3  NEUTROABS 5.6  --   HGB 12.6 13.2  HCT 36.0 37.3  MCV 87.4 85.9  PLT 265 267   Cardiac Enzymes: No results for input(s): CKTOTAL, CKMB, CKMBINDEX, TROPONINI in the last 168 hours. BNP (last 3 results) No results for input(s): PROBNP in the last 8760 hours. CBG: Recent Labs  Lab 02/12/19 0035 02/12/19 0219 02/12/19 0628  GLUCAP 214* 225* 238*   D-Dimer: No results for input(s): DDIMER in the last 72 hours. Hgb A1c: Recent Labs    02/12/19 0537  HGBA1C 8.8*   Lipid Profile: Recent Labs    02/12/19 0537  CHOL 170  HDL 35*  LDLCALC 58  TRIG 385*  CHOLHDL 4.9   Thyroid function studies: No results for  input(s): TSH, T4TOTAL, T3FREE, THYROIDAB in the last 72 hours.  Invalid input(s): FREET3 Anemia work up: No results for input(s): VITAMINB12, FOLATE, FERRITIN, TIBC, IRON, RETICCTPCT in the last 72 hours. Sepsis Labs: Recent Labs  Lab 02/11/19 1716 02/12/19 0537  WBC 9.0 10.3    Microbiology Recent Results (from the past 240 hour(s))  SARS Coronavirus 2 (CEPHEID - Performed in [place] hospital lab), Hosp Order     Status: None   Collection Time: 02/11/19  8:56 PM   Specimen: Nasopharyngeal Swab  Result Value Ref Range Status   SARS Coronavirus 2 NEGATIVE NEGATIVE Final    Comment: (NOTE) If result is NEGATIVE SARS-CoV-2 target nucleic acids are NOT DETECTED. The SARS-CoV-2 RNA is generally detectable in upper and lower  respiratory specimens during the acute phase of infection. The lowest  concentration of SARS-CoV-2 viral copies this assay can detect is 250  copies / mL. A negative result does not preclude SARS-CoV-2 infection  and should not be used as the sole basis for treatment or other  patient management decisions.  A negative result may occur with  improper specimen collection / handling, submission of specimen other  than nasopharyngeal swab, presence of viral mutation(s) within the  areas targeted by this assay, and inadequate number of viral copies  (<250 copies / mL). A negative result must be combined with clinical  observations, patient history, and epidemiological information. If result is POSITIVE SARS-CoV-2 target nucleic acids are DETECTED. The SARS-CoV-2 RNA is generally detectable in upper and lower  respiratory specimens dur ing the acute phase of infection.  Positive  results are indicative of active infection with SARS-CoV-2.  Clinical  correlation with patient history and other diagnostic information is  necessary to determine patient infection status.  Positive results do  not rule out bacterial infection or co-infection with other viruses. If  result is PRESUMPTIVE POSTIVE SARS-CoV-2 nucleic acids MAY BE PRESENT.   A presumptive positive result was obtained on the submitted specimen  and confirmed on repeat testing.  While 2019 novel coronavirus  (SARS-CoV-2) nucleic acids may be present in the submitted sample  additional confirmatory testing may be necessary for epidemiological  and / or clinical management purposes  to differentiate between  SARS-CoV-2 and other Sarbecovirus currently known to infect humans.  If clinically indicated additional testing with an alternate test  methodology [phone]) is advised. The SARS-CoV-2 RNA is generally  detectable in upper and lower respiratory sp ecimens during the acute  phase of infection. The expected result is Negative. Fact Sheet for Patients:  StrictlyIdeas.no Fact Sheet for Healthcare Providers: BankingDealers.co.za This test is not yet approved or cleared by the [place] FDA and has been authorized for  detection and/or diagnosis of SARS-CoV-2 by FDA under an Emergency Use Authorization (EUA).  This EUA will remain in effect (meaning this test can be used) for the duration of the COVID-19 declaration under Section 564(b)(1) of the Act, 21 U.S.C. section 360bbb-3(b)(1), unless the authorization is terminated or revoked sooner. Performed at [facility], [place] [address] [place], [place] [address]     Procedures and diagnostic studies:  Ct Angio Head W Or Wo Contrast  Result Date: 02/12/2019 CLINICAL DATA:  Right facial numbness. Blurred vision in the right eye. EXAM: CT ANGIOGRAPHY HEAD AND NECK TECHNIQUE: Multidetector CT imaging of the head and neck was performed using the standard protocol during bolus administration of intravenous contrast. Multiplanar CT image reconstructions and MIPs were obtained to evaluate the vascular anatomy. Carotid stenosis measurements (when applicable) are obtained utilizing NASCET  criteria, using the distal internal carotid diameter as the denominator. CONTRAST:  33mL OMNIPAQUE IOHEXOL 350 MG/ML SOLN COMPARISON:  MRI brain 02/11/2019 FINDINGS: CT HEAD FINDINGS Brain: Noncontrast images demonstrate no acute intracranial abnormality. The subtle findings along the posterolateral aspect of the left pons are not discernible by CT. Ventricles are of normal size. Persistent cavum septum pellucidum is again noted. No significant extraaxial fluid collection is present. Brainstem and cerebellum are otherwise unremarkable. Vascular: No hyperdense vessel or unexpected calcification. Skull: Calvarium is intact. No focal lytic or blastic lesions are present. Sinuses: The paranasal sinuses and mastoid air cells are clear. Orbits: The globes and orbits are within normal limits. Mild prominence of the lacrimal glands bilaterally is likely within normal limits. Review of the MIP images confirms the above findings CTA NECK FINDINGS Aortic arch: A 3 vessel arch configuration is present. There is no significant stenosis or vascular calcification at the aortic arch. Right carotid system: The right common carotid artery is within normal limits. The right carotid bifurcation is normal. There is mild tortuosity of the cervical right ICA without a significant stenosis. Left carotid system: The left common carotid artery is within normal limits. The left carotid bifurcation is unremarkable. There is mild tortuosity of the cervical left ICA without a significant stenosis. Vertebral arteries: The vertebral arteries both originate from the subclavian arteries. There is no significant stenosis at the origin of either vertebral artery. The vertebral arteries are codominant. There is moderate tortuosity in the proximal left vertebral artery just after the origin without significant stenosis. There is moderate tortuosity of both vertebral arteries in the neck without significant stenosis. Skeleton: There straightening of the  normal cervical lordosis. Vertebral body heights and alignment are maintained. No focal lytic or blastic lesions are present. Other neck: The soft tissues the neck are otherwise within normal limits. Salivary glands are unremarkable. No focal mucosal or submucosal lesions are present. A 6 mm hypodense nodule is noted posteriorly in the left lobe of the thyroid. No follow-up is necessary. No significant adenopathy is present. Upper chest: The lung apices are clear. Review of the MIP images confirms the above findings CTA HEAD FINDINGS Anterior circulation: Atherosclerotic irregularity is present within the cavernous internal carotid arteries bilaterally. There is no significant stenosis relative to the more distal vessel scratched at there is no significant stenosis of greater than 50% relative to the more distant vessel. There is diffuse vessel irregularity in the right M1 segment. The A1 segments are within normal limits. Anterior communicating artery is patent. MCA bifurcations are intact. The distal small vessel segmental irregularity is present in the ACA and MCA branch vessels bilaterally  without a significant proximal stenosis or occlusion. Posterior circulation: The vertebral arteries are codominant. PICA origins are visualized and normal. The vertebrobasilar junction is normal. The basilar artery is within normal limits. Both posterior cerebral arteries originate from the basilar tip. There is mild narrowing at the origin of the left PCA. There is diffuse distal branch vessel irregularity involving PCA branches bilaterally without a significant proximal stenosis or occlusion. Venous sinuses: The dural sinuses are patent. The straight sinus and deep cerebral veins are intact. Cortical veins are unremarkable. Anatomic variants: None Review of the MIP images confirms the above findings IMPRESSION: 1. Diffuse distal branch vessel irregularity throughout the circle-of-Willis without a significant proximal  stenosis or occlusion. 2. Atherosclerotic changes are evident within the cavernous internal carotid arteries and right greater than left M1 segments with diffuse vessel irregularity and mild narrowing of less than 50% relative to the more distal vessels. 3. Tortuosity of the cervical internal carotid arteries and vertebral arteries bilaterally without significant stenosis. This is nonspecific, but often seen in the setting of chronic hypertension. 4. Brainstem infarct and white matter parenchymal changes noted on MRI are not discernible by CT. Electronically Signed   By: San Morelle M.D.   On: 02/12/2019 04:44   Ct Angio Neck W Or Wo Contrast  Result Date: 02/12/2019 CLINICAL DATA:  Right facial numbness. Blurred vision in the right eye. EXAM: CT ANGIOGRAPHY HEAD AND NECK TECHNIQUE: Multidetector CT imaging of the head and neck was performed using the standard protocol during bolus administration of intravenous contrast. Multiplanar CT image reconstructions and MIPs were obtained to evaluate the vascular anatomy. Carotid stenosis measurements (when applicable) are obtained utilizing NASCET criteria, using the distal internal carotid diameter as the denominator. CONTRAST:  9mL OMNIPAQUE IOHEXOL 350 MG/ML SOLN COMPARISON:  MRI brain 02/11/2019 FINDINGS: CT HEAD FINDINGS Brain: Noncontrast images demonstrate no acute intracranial abnormality. The subtle findings along the posterolateral aspect of the left pons are not discernible by CT. Ventricles are of normal size. Persistent cavum septum pellucidum is again noted. No significant extraaxial fluid collection is present. Brainstem and cerebellum are otherwise unremarkable. Vascular: No hyperdense vessel or unexpected calcification. Skull: Calvarium is intact. No focal lytic or blastic lesions are present. Sinuses: The paranasal sinuses and mastoid air cells are clear. Orbits: The globes and orbits are within normal limits. Mild prominence of the lacrimal  glands bilaterally is likely within normal limits. Review of the MIP images confirms the above findings CTA NECK FINDINGS Aortic arch: A 3 vessel arch configuration is present. There is no significant stenosis or vascular calcification at the aortic arch. Right carotid system: The right common carotid artery is within normal limits. The right carotid bifurcation is normal. There is mild tortuosity of the cervical right ICA without a significant stenosis. Left carotid system: The left common carotid artery is within normal limits. The left carotid bifurcation is unremarkable. There is mild tortuosity of the cervical left ICA without a significant stenosis. Vertebral arteries: The vertebral arteries both originate from the subclavian arteries. There is no significant stenosis at the origin of either vertebral artery. The vertebral arteries are codominant. There is moderate tortuosity in the proximal left vertebral artery just after the origin without significant stenosis. There is moderate tortuosity of both vertebral arteries in the neck without significant stenosis. Skeleton: There straightening of the normal cervical lordosis. Vertebral body heights and alignment are maintained. No focal lytic or blastic lesions are present. Other neck: The soft tissues the neck are otherwise within  normal limits. Salivary glands are unremarkable. No focal mucosal or submucosal lesions are present. A 6 mm hypodense nodule is noted posteriorly in the left lobe of the thyroid. No follow-up is necessary. No significant adenopathy is present. Upper chest: The lung apices are clear. Review of the MIP images confirms the above findings CTA HEAD FINDINGS Anterior circulation: Atherosclerotic irregularity is present within the cavernous internal carotid arteries bilaterally. There is no significant stenosis relative to the more distal vessel scratched at there is no significant stenosis of greater than 50% relative to the more distant  vessel. There is diffuse vessel irregularity in the right M1 segment. The A1 segments are within normal limits. Anterior communicating artery is patent. MCA bifurcations are intact. The distal small vessel segmental irregularity is present in the ACA and MCA branch vessels bilaterally without a significant proximal stenosis or occlusion. Posterior circulation: The vertebral arteries are codominant. PICA origins are visualized and normal. The vertebrobasilar junction is normal. The basilar artery is within normal limits. Both posterior cerebral arteries originate from the basilar tip. There is mild narrowing at the origin of the left PCA. There is diffuse distal branch vessel irregularity involving PCA branches bilaterally without a significant proximal stenosis or occlusion. Venous sinuses: The dural sinuses are patent. The straight sinus and deep cerebral veins are intact. Cortical veins are unremarkable. Anatomic variants: None Review of the MIP images confirms the above findings IMPRESSION: 1. Diffuse distal branch vessel irregularity throughout the circle-of-Willis without a significant proximal stenosis or occlusion. 2. Atherosclerotic changes are evident within the cavernous internal carotid arteries and right greater than left M1 segments with diffuse vessel irregularity and mild narrowing of less than 50% relative to the more distal vessels. 3. Tortuosity of the cervical internal carotid arteries and vertebral arteries bilaterally without significant stenosis. This is nonspecific, but often seen in the setting of chronic hypertension. 4. Brainstem infarct and white matter parenchymal changes noted on MRI are not discernible by CT. Electronically Signed   By: San Morelle M.D.   On: 02/12/2019 04:44   Mr Brain Wo Contrast (neuro Protocol)  Result Date: 02/11/2019 CLINICAL DATA:  Initial evaluation for acute right facial numbness. EXAM: MRI HEAD WITHOUT CONTRAST TECHNIQUE: Multiplanar, multiecho  pulse sequences of the brain and surrounding structures were obtained without intravenous contrast. COMPARISON:  Previous brain MRI from 12/16/2005. FINDINGS: Brain: Cerebral volume within normal limits for age. Mild scattered subcentimeter T2/FLAIR hyperintensities seen involving the periventricular and deep white matter both cerebral hemispheres, nonspecific, but most like related chronic microvascular ischemic disease. Chronic microvascular ischemic changes present within the pons as well. Patchy small volume restricted diffusion seen involving the left lateral midbrain/pons (series 5, image 73), consistent with acute ischemic infarct. This measures approximately 1 cm in greatest dimension. No associated hemorrhage or mass effect. No other evidence for acute or subacute ischemia gray-white matter differentiation otherwise maintained. No other areas of chronic cortical infarction. No foci of susceptibility artifact to suggest acute or chronic intracranial hemorrhage. No mass lesion, midline shift or mass effect. No hydrocephalus. No extra-axial fluid collection. Pituitary gland suprasellar region within normal limits. Midline structures intact. Vascular: Major intracranial vascular flow voids are maintained. Skull and upper cervical spine: Craniocervical junction within normal limits. Bone marrow signal intensity diffusely decreased on T1 weighted imaging, most commonly related to anemia, smoking, or obesity. No focal marrow replacing lesion. No scalp soft tissue abnormality. Sinuses/Orbits: Globes and orbital soft tissues within normal limits. Mild scattered mucosal thickening throughout the paranasal sinuses.  No air-fluid level to suggest acute sinusitis. No mastoid effusion. Inner ear structures normal. Other: None. IMPRESSION: 1. Patchy small volume acute ischemic nonhemorrhagic infarct involving the left lateral midbrain/pons. 2. Underlying mild chronic microvascular ischemic disease. Electronically Signed    By: Jeannine Boga M.D.   On: 02/11/2019 19:28   Dg Chest Port 1 View  Result Date: 02/11/2019 CLINICAL DATA:  Hypertension. EXAM: PORTABLE CHEST 1 VIEW COMPARISON:  Radiograph 05/08/2006, no more recent exam. FINDINGS: Mild to moderate cardiomegaly. Normal mediastinal contours. Mild central bronchial thickening. No pulmonary edema, focal airspace disease, pleural effusion or pneumothorax. IMPRESSION: Mild to moderate cardiomegaly. Mild central bronchial thickening. Electronically Signed   By: Keith Rake M.D.   On: 02/11/2019 19:50    Medications:    amLODipine  10 mg Oral Daily   aspirin  300 mg Rectal Daily   Or   aspirin  325 mg Oral Daily   atorvastatin  80 mg Oral q1800   enoxaparin (LOVENOX) injection  40 mg Subcutaneous Q24H   insulin aspart  0-9 Units Subcutaneous TID WC   insulin glargine  10 Units Subcutaneous BID   Continuous Infusions:  sodium chloride 75 mL/hr at 02/12/19 0106     LOS: 0 days   Geradine Girt  [facility]   How to contact the [facility] Attending or Consulting provider [place] or covering provider during after hours [place], for this patient?  1. Check the care team in [facility] and look for a) attending/consulting [facility] provider listed and b) the [facility] team listed 2. Log into [email or web address] and use [place]'s universal password to access. If you do not have the password, please contact the hospital operator. 3. Locate the [facility] provider you are looking for under [facility] and page to a number that you can be directly reached. 4. If you still have difficulty reaching the provider, please page the [facility] (Director on Call) for the Hospitalists listed on amion for assistance.  02/12/2019, 10:09 AM

## 2019-02-12 NOTE — Evaluation (Signed)
Physical Therapy Evaluation Patient Details Name: [name] MRN: [id number] DOB: 1971-03-20 Today's Date: 02/12/2019   History of Present Illness  48 yo female with onset of R facial numbness and blurring of vision in R eye was admitted and diagosed with L midbrain/pons ischemic infarct.  Was home with kids and alone most of the time, no AD needed.  PMHx:   OSA, CPAP, achilles repair, atherosclerosis internal carotids, HTN, DM, smoker, pseudotumor cranium,   Clinical Impression  Pt was seen for mobility assessment and note significant ataxia with LLE crossing over, listing to R and unsafe standing from side of bed.  Pt is more controlled with gait after walking through the room, but is not going to be able to go home alone as she is.  Recommend CIR as she has children at home a lot of the time, and will benefit from short rehab to increase control of balance and quality of gait.  Will follow for progression of coordination and strengthening to LE's for increased safety and independence of gait.    Follow Up Recommendations CIR    Equipment Recommendations  None recommended by PT    Recommendations for Other Services Rehab consult     Precautions / Restrictions Precautions Precautions: Fall;Other (comment)(monitor HR and BP) Restrictions Weight Bearing Restrictions: No      Mobility  Bed Mobility Overal bed mobility: Needs Assistance Bed Mobility: Supine to Sit;Sit to Supine     Supine to sit: Min guard Sit to supine: Min guard      Transfers Overall transfer level: Needs assistance Equipment used: Rolling walker (2 wheeled);1 person hand held assist Transfers: Sit to/from Stand Sit to Stand: Min assist         General transfer comment: had to help pt gait control of standing with her walker initially  Ambulation/Gait Ambulation/Gait assistance: Min guard;Min assist Gait Distance (Feet): 35 Feet Assistive device: Rolling walker (2 wheeled);1 person hand held  assist Gait Pattern/deviations: Step-to pattern;Step-through pattern Gait velocity: reduced Gait velocity interpretation: <1.8 ft/sec, indicate of risk for recurrent falls General Gait Details: crosses over on LLE at times, listing to R at times  Stairs            Wheelchair Mobility    Modified Rankin (Stroke Patients Only)       Balance Overall balance assessment: Needs assistance Sitting-balance support: Feet supported Sitting balance-Leahy Scale: Fair     Standing balance support: Bilateral upper extremity supported;During functional activity Standing balance-Leahy Scale: Poor                               Pertinent Vitals/Pain Pain Assessment: No/denies pain    Home Living Family/patient expects to be discharged to:: Private residence Living Arrangements: Children Available Help at Discharge: Family;Available PRN/intermittently Type of Home: Apartment Home Access: Level entry     Home Layout: One level Home Equipment: None      Prior Function Level of Independence: Independent         Comments: home with family and alone at times     Hand Dominance        Extremity/Trunk Assessment   Upper Extremity Assessment Upper Extremity Assessment: Overall WFL for tasks assessed    Lower Extremity Assessment Lower Extremity Assessment: Generalized weakness    Cervical / Trunk Assessment Cervical / Trunk Assessment: Normal  Communication      Cognition Arousal/Alertness: Awake/alert Behavior During Therapy: WFL for tasks  assessed/performed;Impulsive(mild standing impulsivity from bed and commode) Overall Cognitive Status: Within Functional Limits for tasks assessed                                        General Comments General comments (skin integrity, edema, etc.): Pt was able to walk with help and requires minor assistance but is home alone and a higher fall risk now.  Will recommend her to CIR for evaluation and  may need to receive AD when done.    Exercises     Assessment/Plan    PT Assessment Patient needs continued PT services  PT Problem List Decreased strength;Decreased range of motion;Decreased activity tolerance;Decreased balance;Decreased mobility;Decreased coordination;Decreased knowledge of use of DME;Decreased safety awareness;Decreased knowledge of precautions;Cardiopulmonary status limiting activity;Obesity       PT Treatment Interventions DME instruction;Gait training;Functional mobility training;Therapeutic activities;Therapeutic exercise;Balance training;Neuromuscular re-education;Patient/family education    PT Goals (Current goals can be found in the Care Plan section)  Acute Rehab PT Goals Patient Stated Goal: to get up and walk alone to get home PT Goal Formulation: With patient Time For Goal Achievement: 02/26/19 Potential to Achieve Goals: Good    Frequency Min 5X/week   Barriers to discharge Decreased caregiver support home alone during the day    Co-evaluation               AM-PAC PT "6 Clicks" Mobility  Outcome Measure Help needed turning from your back to your side while in a flat bed without using bedrails?: None Help needed moving from lying on your back to sitting on the side of a flat bed without using bedrails?: A Little Help needed moving to and from a bed to a chair (including a wheelchair)?: A Little Help needed standing up from a chair using your arms (e.g., wheelchair or bedside chair)?: A Little Help needed to walk in hospital room?: A Little Help needed climbing 3-5 steps with a railing? : A Lot 6 Click Score: 18    End of Session Equipment Utilized During Treatment: Gait belt Activity Tolerance: Patient tolerated treatment well;Treatment limited secondary to medical complications (Comment)(weakness with ataxia) Patient left: in bed;with call bell/phone within reach;with bed alarm set Nurse Communication: Mobility status PT Visit  Diagnosis: Muscle weakness (generalized) (M62.81);Ataxic gait (R26.0)    Time: 3893-7342 PT Time Calculation (min) (ACUTE ONLY): 30 min   Charges:   PT Evaluation $PT Eval Moderate Complexity: 1 Mod PT Treatments $Gait Training: 8-22 mins       Ramond Dial 02/12/2019, 10:47 AM  Mee Hives, PT MS Acute Rehab Dept. Number: [place] [facility] [place]

## 2019-02-12 NOTE — Evaluation (Addendum)
Occupational Therapy Evaluation Patient Details Name: [name] MRN: [id number] DOB: 19-Jan-1971 Today's Date: 02/12/2019    History of Present Illness 48 yo female with onset of R facial numbness and blurring of vision in R eye was admitted and diagosed with L midbrain/pons ischemic infarct.  Was home with kids and alone most of the time, no AD needed.  PMHx:   OSA, CPAP, achilles repair, atherosclerosis internal carotids, HTN, DM, smoker, pseudotumor cranium,    Clinical Impression   This 48 y/o female presents with the above. PTA pt reports she is independent with ADL, iADL and functional mobility. Pt requiring minA for room level functional mobility this session using RW; completing toileting ADL, standing grooming and demonstrating LB ADL with minA overall. Pt requires min cues for safety due to mild impulsivities and decreased safety awareness. She also endorses blurry vision in R eye but does not appear to impact her during functional tasks today. Pt will benefit from continued acute OT services and post acute therapy services. Currently recommend CIR level services to progress pt to PLOF of independent , however pending progress pt she may get to point where she can return home with [facility] therapy services. Will continue to follow.     Follow Up Recommendations  CIR;Supervision/Assistance - 24 hour    Equipment Recommendations  Tub/shower seat           Precautions / Restrictions Precautions Precautions: Fall;Other (comment)(monitor HR and BP) Restrictions Weight Bearing Restrictions: No      Mobility Bed Mobility Overal bed mobility: Needs Assistance Bed Mobility: Supine to Sit;Sit to Supine     Supine to sit: Min guard Sit to supine: Min guard   General bed mobility comments: for safety/lines, HOB elevated   Transfers Overall transfer level: Needs assistance Equipment used: Rolling walker (2 wheeled);1 person hand held assist Transfers: Sit to/from Stand Sit to  Stand: Min assist;Min guard         General transfer comment: light minA to rise to standing and to steady at RW; stood from EOB and toilet    Balance Overall balance assessment: Needs assistance Sitting-balance support: Feet supported Sitting balance-Leahy Scale: Fair     Standing balance support: Bilateral upper extremity supported;During functional activity Standing balance-Leahy Scale: Fair Standing balance comment: able to maintain static standing briefly with close minguard                           ADL either performed or assessed with clinical judgement   ADL Overall ADL's : Needs assistance/impaired Eating/Feeding: Modified independent;Sitting   Grooming: Wash/dry hands;Wash/dry face;Oral care;Minimal assistance;Min guard;Standing Grooming Details (indicate cue type and reason): close minguard to minA for standing balance Upper Body Bathing: Min guard;Sitting   Lower Body Bathing: Minimal assistance;Sit to/from stand   Upper Body Dressing : Min guard;Set up;Sitting   Lower Body Dressing: Minimal assistance;Sit to/from stand Lower Body Dressing Details (indicate cue type and reason): minA standing balance; pt able to readjust socks seated EOB using figure 4 Toilet Transfer: Minimal assistance;Ambulation;Regular Toilet;RW;Grab bars   Toileting- Clothing Manipulation and Hygiene: Minimal assistance;Min guard;Sit to/from stand Toileting - Clothing Manipulation Details (indicate cue type and reason): close minguard-minA for clothing management including gown and underwear (in standing); performed peri-care via lateral leans      Functional mobility during ADLs: Minimal assistance;Rolling walker       Vision Baseline Vision/History: Wears glasses Wears Glasses: At all times Patient Visual Report: Blurring of  vision(R eye) Vision Assessment?: Yes Eye Alignment: Within Functional Limits Ocular Range of Motion: Within Functional Limits Alignment/Gaze  Preference: Within Defined Limits Tracking/Visual Pursuits: Able to track stimulus in all quads without difficulty Additional Comments: will continue to assess     Perception     Praxis      Pertinent Vitals/Pain Pain Assessment: No/denies pain     Hand Dominance     Extremity/Trunk Assessment Upper Extremity Assessment Upper Extremity Assessment: Overall WFL for tasks assessed   Lower Extremity Assessment Lower Extremity Assessment: Defer to PT evaluation   Cervical / Trunk Assessment Cervical / Trunk Assessment: Normal   Communication Communication Communication: No difficulties   Cognition Arousal/Alertness: Awake/alert Behavior During Therapy: WFL for tasks assessed/performed;Impulsive Overall Cognitive Status: No family/caregiver present to determine baseline cognitive functioning                                 General Comments: overall appears [facility] for basic tasks however mild imuplsivities and decreased safety noted requiring min cues to correct   General Comments       Exercises     Shoulder Instructions      Home Living Family/patient expects to be discharged to:: Private residence Living Arrangements: Children Available Help at Discharge: Family;Available PRN/intermittently Type of Home: Apartment Home Access: Level entry     Home Layout: One level         Biochemist, clinical: Standard     Home Equipment: None      Lives With: Family    Prior Functioning/Environment Level of Independence: Independent        Comments: home with family and alone at times, lives with 3 sons (oldest son age 42)        OT Problem List: Decreased strength;Decreased range of motion;Impaired balance (sitting and/or standing);Impaired vision/perception;Decreased safety awareness;Decreased knowledge of use of DME or AE;Obesity;Decreased activity tolerance      OT Treatment/Interventions: Self-care/ADL training;Neuromuscular education;DME and/or AE  instruction;Therapeutic activities;Patient/family education;Balance training;Therapeutic exercise    OT Goals(Current goals can be found in the care plan section) Acute Rehab OT Goals Patient Stated Goal: home  OT Goal Formulation: With patient Time For Goal Achievement: 02/26/19 Potential to Achieve Goals: Good  OT Frequency: Min 2X/week   Barriers to D/C:            Co-evaluation              AM-PAC OT "6 Clicks" Daily Activity     Outcome Measure Help from another person eating meals?: None Help from another person taking care of personal grooming?: A Little Help from another person toileting, which includes using toliet, bedpan, or urinal?: A Little Help from another person bathing (including washing, rinsing, drying)?: A Little Help from another person to put on and taking off regular upper body clothing?: None Help from another person to put on and taking off regular lower body clothing?: A Little 6 Click Score: 20   End of Session Equipment Utilized During Treatment: Gait belt;Rolling walker Nurse Communication: Mobility status  Activity Tolerance: Patient tolerated treatment well Patient left: in bed;with call bell/phone within reach;with bed alarm set  OT Visit Diagnosis: Muscle weakness (generalized) (M62.81);Unsteadiness on feet (R26.81);Other symptoms and signs involving the nervous system (O17.510)                Time: 2585-2778 OT Time Calculation (min): 25 min Charges:  OT General Charges $OT Visit: 1  Visit OT Evaluation $OT Eval Moderate Complexity: 1 Mod OT Treatments $Self Care/Home Management : 8-22 mins  Lou Cal, OT Supplemental Rehabilitation Services Pager [phone] Office [phone]   Raymondo Band 02/12/2019, 3:42 PM

## 2019-02-12 NOTE — Evaluation (Signed)
Speech Language Pathology Evaluation Patient Details Name: [name] MRN: [id number] DOB: 1970-08-10 Today's Date: 02/12/2019 Time: 8756-4332 SLP Time Calculation (min) (ACUTE ONLY): 23 min  Problem List:  Patient Active Problem List   Diagnosis Date Noted  . Acute CVA (cerebrovascular accident) ([place]) 02/11/2019  . Hypertensive urgency 02/11/2019  . ARF (acute renal failure) ([place]) 02/11/2019  . Type 2 diabetes mellitus with vascular disease ([place]) 02/11/2019  . Diabetes mellitus, type 2 ([place]) 04/12/2018  . Hypertension associated with diabetes ([place]) 01/08/2018  . Obstructive sleep apnea 08/26/2017   Past Medical History:  Past Medical History:  Diagnosis Date  . Arthritis   . Diabetes mellitus   . Hypertension   . Pseudotumor cerebri    Past Surgical History:  Past Surgical History:  Procedure Laterality Date  . ACHILLES TENDON REPAIR     HPI:  Pt is a 48 y.o. female with history of hypertension, diabetes mellitus who presented to the ED with complaints of right facial numbness which happened almost 48 hours prior and the pt thought was an allergic reaction to lobster. MRI of the brain revealed patchy small volume acute ischemic nonhemorrhagic infarct involving the left lateral midbrain/pons.   Assessment / Plan / Recommendation Clinical Impression  Pt reported that she was living independently prior to admission. She denied any baseline deficits in speech, language or cognition but stated that she is now having difficulty with word retrieval during conversation and has to rehearse steps in order to complete them. She presented with mild aphasia characterized by deficits in audiitory comprehension and verbal expression. Receptively, she was able to follow 2-step commands and answer simple yes/no questions but required additional processing time for complex questions and exhibited moderate difficulty with paragraph-level material as well as 3-step commands. She required  additional processing time for word retrieval during structured tasks and at the conversational level. Continued skilled SLP services are clinically indicated at this time to target aphasia.     SLP Assessment  SLP Recommendation/Assessment: Patient needs continued Speech Lanaguage Pathology Services SLP Visit Diagnosis: Aphasia (R47.01)    Follow Up Recommendations  Other (comment)(Continued SLP services at level of care rx by PT/OT)    Frequency and Duration min 2x/week  2 weeks      SLP Evaluation Cognition  Overall Cognitive Status: Within Functional Limits for tasks assessed Arousal/Alertness: Awake/alert Orientation Level: Oriented to person;Oriented to place;Oriented to situation;Disoriented to time(Oriented to months, day, year but not date. ) Attention: Focused;Sustained Focused Attention: Appears intact Sustained Attention: Appears intact Memory: Impaired Memory Impairment: Retrieval deficit;Decreased recall of new information;Storage deficit(Immediate: 3/3; delayed: 0/3; with cues: 3/3) Awareness: Appears intact Problem Solving: Appears intact       Comprehension  Auditory Comprehension Overall Auditory Comprehension: Impaired Yes/No Questions: Impaired Basic Biographical Questions: (5/5) Complex Questions: (4/5) Paragraph Comprehension (via yes/no questions): (0/4) Commands: Impaired Two Step Basic Commands: (4/4) Multistep Basic Commands: (1/4) Conversation: Complex Visual Recognition/Discrimination Discrimination: Within Function Limits Reading Comprehension Reading Status: Within funtional limits    Expression Expression Primary Mode of Expression: Verbal Verbal Expression Overall Verbal Expression: Impaired Initiation: No impairment Automatic Speech: Counting;Day of week;Month of year(With addtional processing time. ) Level of Generative/Spontaneous Verbalization: Conversation Repetition: No impairment Naming: Impairment Responsive: (4/5 with  additional processing time. ) Confrontation: Within functional limits(10/101) Convergent: (Sentence completion: 5/5)   Oral / Motor  Oral Motor/Sensory Function Overall Oral Motor/Sensory Function: Within functional limits Motor Speech Overall Motor Speech: Appears within functional limits for tasks assessed Respiration: Within functional  limits Phonation: Normal Resonance: Within functional limits Articulation: Within functional limitis Intelligibility: Intelligible Motor Planning: Witnin functional limits Motor Speech Errors: Not applicable   [name] I. [name], [name], [name] Office number [phone] Pager [place] 02/12/2019, 10:18 AM

## 2019-02-12 NOTE — Procedures (Signed)
Echo attempted. Patient stated she was nauseous. Patient called nurse to get something to drink. Will attempt again later.

## 2019-02-13 ENCOUNTER — Encounter (HOSPITAL_COMMUNITY): Payer: Self-pay | Admitting: Physical Medicine and Rehabilitation

## 2019-02-13 ENCOUNTER — Inpatient Hospital Stay (HOSPITAL_COMMUNITY)
Admission: RE | Admit: 2019-02-13 | Discharge: 2019-02-20 | DRG: 057 | Disposition: A | Discharge status: Home Health | Payer: Medicaid Other | Source: Intra-hospital | Attending: Physical Medicine & Rehabilitation | Admitting: Physical Medicine & Rehabilitation

## 2019-02-13 ENCOUNTER — Other Ambulatory Visit: Payer: Self-pay

## 2019-02-13 DIAGNOSIS — E1140 Type 2 diabetes mellitus with diabetic neuropathy, unspecified: Secondary | ICD-10-CM | POA: Diagnosis present

## 2019-02-13 DIAGNOSIS — R269 Unspecified abnormalities of gait and mobility: Secondary | ICD-10-CM | POA: Diagnosis not present

## 2019-02-13 DIAGNOSIS — E876 Hypokalemia: Secondary | ICD-10-CM | POA: Diagnosis present

## 2019-02-13 DIAGNOSIS — Z79899 Other long term (current) drug therapy: Secondary | ICD-10-CM | POA: Diagnosis not present

## 2019-02-13 DIAGNOSIS — G5601 Carpal tunnel syndrome, right upper limb: Secondary | ICD-10-CM | POA: Diagnosis not present

## 2019-02-13 DIAGNOSIS — I10 Essential (primary) hypertension: Secondary | ICD-10-CM | POA: Diagnosis not present

## 2019-02-13 DIAGNOSIS — Z794 Long term (current) use of insulin: Secondary | ICD-10-CM | POA: Diagnosis not present

## 2019-02-13 DIAGNOSIS — Z833 Family history of diabetes mellitus: Secondary | ICD-10-CM | POA: Diagnosis not present

## 2019-02-13 DIAGNOSIS — I639 Cerebral infarction, unspecified: Secondary | ICD-10-CM

## 2019-02-13 DIAGNOSIS — Z8249 Family history of ischemic heart disease and other diseases of the circulatory system: Secondary | ICD-10-CM | POA: Diagnosis not present

## 2019-02-13 DIAGNOSIS — G4733 Obstructive sleep apnea (adult) (pediatric): Secondary | ICD-10-CM

## 2019-02-13 DIAGNOSIS — I6350 Cerebral infarction due to unspecified occlusion or stenosis of unspecified cerebral artery: Secondary | ICD-10-CM | POA: Diagnosis present

## 2019-02-13 DIAGNOSIS — Z825 Family history of asthma and other chronic lower respiratory diseases: Secondary | ICD-10-CM | POA: Diagnosis not present

## 2019-02-13 DIAGNOSIS — I1A0 Resistant hypertension: Secondary | ICD-10-CM

## 2019-02-13 DIAGNOSIS — F419 Anxiety disorder, unspecified: Secondary | ICD-10-CM | POA: Diagnosis present

## 2019-02-13 DIAGNOSIS — Z841 Family history of disorders of kidney and ureter: Secondary | ICD-10-CM

## 2019-02-13 DIAGNOSIS — N179 Acute kidney failure, unspecified: Secondary | ICD-10-CM | POA: Diagnosis present

## 2019-02-13 DIAGNOSIS — K5900 Constipation, unspecified: Secondary | ICD-10-CM | POA: Diagnosis present

## 2019-02-13 DIAGNOSIS — I169 Hypertensive crisis, unspecified: Secondary | ICD-10-CM

## 2019-02-13 DIAGNOSIS — I160 Hypertensive urgency: Secondary | ICD-10-CM

## 2019-02-13 DIAGNOSIS — R202 Paresthesia of skin: Secondary | ICD-10-CM | POA: Diagnosis present

## 2019-02-13 DIAGNOSIS — N189 Chronic kidney disease, unspecified: Secondary | ICD-10-CM | POA: Diagnosis present

## 2019-02-13 DIAGNOSIS — I129 Hypertensive chronic kidney disease with stage 1 through stage 4 chronic kidney disease, or unspecified chronic kidney disease: Secondary | ICD-10-CM | POA: Diagnosis present

## 2019-02-13 DIAGNOSIS — F1290 Cannabis use, unspecified, uncomplicated: Secondary | ICD-10-CM | POA: Diagnosis present

## 2019-02-13 DIAGNOSIS — E1169 Type 2 diabetes mellitus with other specified complication: Secondary | ICD-10-CM

## 2019-02-13 DIAGNOSIS — Z87891 Personal history of nicotine dependence: Secondary | ICD-10-CM

## 2019-02-13 DIAGNOSIS — E1159 Type 2 diabetes mellitus with other circulatory complications: Secondary | ICD-10-CM

## 2019-02-13 DIAGNOSIS — I69351 Hemiplegia and hemiparesis following cerebral infarction affecting right dominant side: Principal | ICD-10-CM

## 2019-02-13 DIAGNOSIS — Z9114 Patient's other noncompliance with medication regimen: Secondary | ICD-10-CM

## 2019-02-13 DIAGNOSIS — Z9119 Patient's noncompliance with other medical treatment and regimen: Secondary | ICD-10-CM

## 2019-02-13 DIAGNOSIS — R7309 Other abnormal glucose: Secondary | ICD-10-CM | POA: Diagnosis not present

## 2019-02-13 DIAGNOSIS — I161 Hypertensive emergency: Secondary | ICD-10-CM | POA: Diagnosis present

## 2019-02-13 DIAGNOSIS — R110 Nausea: Secondary | ICD-10-CM | POA: Diagnosis not present

## 2019-02-13 DIAGNOSIS — R200 Anesthesia of skin: Secondary | ICD-10-CM | POA: Diagnosis present

## 2019-02-13 DIAGNOSIS — Z6832 Body mass index (BMI) 32.0-32.9, adult: Secondary | ICD-10-CM

## 2019-02-13 DIAGNOSIS — E1122 Type 2 diabetes mellitus with diabetic chronic kidney disease: Secondary | ICD-10-CM | POA: Diagnosis present

## 2019-02-13 DIAGNOSIS — M1990 Unspecified osteoarthritis, unspecified site: Secondary | ICD-10-CM | POA: Diagnosis present

## 2019-02-13 DIAGNOSIS — I69398 Other sequelae of cerebral infarction: Secondary | ICD-10-CM

## 2019-02-13 DIAGNOSIS — Z91148 Patient's other noncompliance with medication regimen for other reason: Secondary | ICD-10-CM

## 2019-02-13 DIAGNOSIS — E669 Obesity, unspecified: Secondary | ICD-10-CM | POA: Diagnosis present

## 2019-02-13 DIAGNOSIS — G4700 Insomnia, unspecified: Secondary | ICD-10-CM | POA: Diagnosis present

## 2019-02-13 LAB — GLUCOSE, CAPILLARY
Glucose-Capillary: 198 mg/dL — ABNORMAL HIGH (ref 70–99)
Glucose-Capillary: 176 mg/dL — ABNORMAL HIGH (ref 70–99)
Glucose-Capillary: 201 mg/dL — ABNORMAL HIGH (ref 70–99)
Glucose-Capillary: 199 mg/dL — ABNORMAL HIGH (ref 70–99)

## 2019-02-13 MED ORDER — ACETAMINOPHEN 325 MG PO TABS: 325.0000 mg | ORAL_TABLET | ORAL | Status: DC | PRN

## 2019-02-13 MED ORDER — ENOXAPARIN SODIUM 40 MG/0.4ML ~~LOC~~ SOLN
40.0000 mg | SUBCUTANEOUS | Status: DC
  Administered 2019-02-14 – 2019-02-19 (×6): 40 mg via SUBCUTANEOUS
  Filled 2019-02-13 (×6): qty 0.4

## 2019-02-13 MED ORDER — MAGNESIUM OXIDE 400 (241.3 MG) MG PO TABS
400.0000 mg | ORAL_TABLET | Freq: Two times a day (BID) | ORAL | Status: DC
  Administered 2019-02-13 – 2019-02-20 (×14): 400 mg via ORAL
  Filled 2019-02-13 (×14): qty 1

## 2019-02-13 MED ORDER — BLOOD PRESSURE CONTROL BOOK
Freq: Once | Status: AC
  Administered 2019-02-13: 18:00:00
  Filled 2019-02-13: qty 1

## 2019-02-13 MED ORDER — ASPIRIN 81 MG PO TBEC: 81.0000 mg | DELAYED_RELEASE_TABLET | Freq: Every day | ORAL | Status: DC

## 2019-02-13 MED ORDER — POLYETHYLENE GLYCOL 3350 17 G PO PACK
17.0000 g | PACK | Freq: Once | ORAL | Status: AC
  Administered 2019-02-14: 17 g via ORAL
  Filled 2019-02-13: qty 1

## 2019-02-13 MED ORDER — CLOPIDOGREL BISULFATE 75 MG PO TABS
75.0000 mg | ORAL_TABLET | Freq: Every day | ORAL | Status: DC
  Administered 2019-02-14 – 2019-02-20 (×7): 75 mg via ORAL
  Filled 2019-02-13 (×7): qty 1

## 2019-02-13 MED ORDER — GUAIFENESIN-DM 100-10 MG/5ML PO SYRP: 5.0000 mL | ORAL_SOLUTION | Freq: Four times a day (QID) | ORAL | Status: DC | PRN

## 2019-02-13 MED ORDER — ATORVASTATIN CALCIUM 40 MG PO TABS
40.0000 mg | ORAL_TABLET | Freq: Every day | ORAL | Status: DC
  Administered 2019-02-13 – 2019-02-19 (×7): 40 mg via ORAL
  Filled 2019-02-13 (×7): qty 1

## 2019-02-13 MED ORDER — LIVING WELL WITH DIABETES BOOK
Freq: Once | Status: AC
  Administered 2019-02-13: 18:00:00
  Filled 2019-02-13: qty 1

## 2019-02-13 MED ORDER — BISACODYL 10 MG RE SUPP: 10.0000 mg | Freq: Every day | RECTAL | Status: DC | PRN

## 2019-02-13 MED ORDER — DIPHENHYDRAMINE HCL 12.5 MG/5ML PO ELIX: 12.5000 mg | ORAL_SOLUTION | Freq: Four times a day (QID) | ORAL | Status: DC | PRN

## 2019-02-13 MED ORDER — ASPIRIN EC 81 MG PO TBEC
81.0000 mg | DELAYED_RELEASE_TABLET | Freq: Every day | ORAL | Status: DC
  Administered 2019-02-14 – 2019-02-20 (×7): 81 mg via ORAL
  Filled 2019-02-13 (×7): qty 1

## 2019-02-13 MED ORDER — PROCHLORPERAZINE EDISYLATE 10 MG/2ML IJ SOLN: 5.0000 mg | Freq: Four times a day (QID) | INTRAMUSCULAR | Status: DC | PRN

## 2019-02-13 MED ORDER — POLYETHYLENE GLYCOL 3350 17 G PO PACK
17.0000 g | PACK | Freq: Every day | ORAL | Status: DC | PRN
  Filled 2019-02-13 (×2): qty 1

## 2019-02-13 MED ORDER — MAGNESIUM SULFATE 2 GM/50ML IV SOLN
2.0000 g | Freq: Once | INTRAVENOUS | Status: AC
  Administered 2019-02-13: 2 g via INTRAVENOUS
  Filled 2019-02-13: qty 50

## 2019-02-13 MED ORDER — INSULIN ASPART 100 UNIT/ML ~~LOC~~ SOLN
0.0000 [IU] | Freq: Three times a day (TID) | SUBCUTANEOUS | Status: DC
  Administered 2019-02-13: 3 [IU] via SUBCUTANEOUS
  Administered 2019-02-14 (×2): 2 [IU] via SUBCUTANEOUS
  Administered 2019-02-14 – 2019-02-15 (×3): 1 [IU] via SUBCUTANEOUS
  Administered 2019-02-16: 2 [IU] via SUBCUTANEOUS
  Administered 2019-02-16 – 2019-02-17 (×3): 1 [IU] via SUBCUTANEOUS
  Administered 2019-02-17: 2 [IU] via SUBCUTANEOUS
  Administered 2019-02-18 – 2019-02-19 (×2): 1 [IU] via SUBCUTANEOUS

## 2019-02-13 MED ORDER — ALUM & MAG HYDROXIDE-SIMETH 200-200-20 MG/5ML PO SUSP
30.0000 mL | ORAL | Status: DC | PRN
  Filled 2019-02-13: qty 30

## 2019-02-13 MED ORDER — AMLODIPINE BESYLATE 10 MG PO TABS
10.0000 mg | ORAL_TABLET | Freq: Every day | ORAL | Status: DC
  Administered 2019-02-14 – 2019-02-20 (×7): 10 mg via ORAL
  Filled 2019-02-13 (×7): qty 1

## 2019-02-13 MED ORDER — INSULIN ASPART 100 UNIT/ML ~~LOC~~ SOLN
0.0000 [IU] | Freq: Every day | SUBCUTANEOUS | Status: DC
  Administered 2019-02-15 – 2019-02-16 (×2): 2 [IU] via SUBCUTANEOUS

## 2019-02-13 MED ORDER — ATORVASTATIN CALCIUM 40 MG PO TABS: 40.0000 mg | ORAL_TABLET | Freq: Every day | ORAL | Status: DC

## 2019-02-13 MED ORDER — POTASSIUM CHLORIDE CRYS ER 20 MEQ PO TBCR
40.0000 meq | EXTENDED_RELEASE_TABLET | Freq: Once | ORAL | Status: AC
  Administered 2019-02-13: 40 meq via ORAL
  Filled 2019-02-13: qty 2

## 2019-02-13 MED ORDER — FLEET ENEMA 7-19 GM/118ML RE ENEM: 1.0000 | ENEMA | Freq: Once | RECTAL | Status: DC | PRN

## 2019-02-13 MED ORDER — ALBUTEROL SULFATE (2.5 MG/3ML) 0.083% IN NEBU: 3.0000 mL | INHALATION_SOLUTION | Freq: Four times a day (QID) | RESPIRATORY_TRACT | Status: DC | PRN

## 2019-02-13 MED ORDER — PROCHLORPERAZINE 25 MG RE SUPP
12.5000 mg | Freq: Four times a day (QID) | RECTAL | Status: DC | PRN
  Filled 2019-02-13: qty 1

## 2019-02-13 MED ORDER — TRAZODONE HCL 50 MG PO TABS
25.0000 mg | ORAL_TABLET | Freq: Every evening | ORAL | Status: DC | PRN
  Administered 2019-02-19: 50 mg via ORAL
  Filled 2019-02-13: qty 1

## 2019-02-13 MED ORDER — LISINOPRIL-HYDROCHLOROTHIAZIDE 20-12.5 MG PO TABS: 2.0000 | ORAL_TABLET | Freq: Every day | ORAL | 3 refills | Status: DC

## 2019-02-13 MED ORDER — INSULIN GLARGINE 100 UNIT/ML ~~LOC~~ SOLN
10.0000 [IU] | Freq: Two times a day (BID) | SUBCUTANEOUS | Status: DC
  Administered 2019-02-13 – 2019-02-14 (×2): 10 [IU] via SUBCUTANEOUS
  Filled 2019-02-13 (×4): qty 0.1

## 2019-02-13 MED ORDER — CLOPIDOGREL BISULFATE 75 MG PO TABS: 75.0000 mg | ORAL_TABLET | Freq: Every day | ORAL | Status: DC

## 2019-02-13 MED ORDER — PROCHLORPERAZINE MALEATE 5 MG PO TABS
5.0000 mg | ORAL_TABLET | Freq: Four times a day (QID) | ORAL | Status: DC | PRN
  Administered 2019-02-16 – 2019-02-18 (×3): 10 mg via ORAL
  Filled 2019-02-13 (×3): qty 2

## 2019-02-13 NOTE — Progress Notes (Signed)
Jamse Arn, MD  Physician  Physical Medicine and Rehabilitation  PMR Pre-admission  Signed  Date of Service:  02/13/2019 1:36 PM      Related encounter: ED to Hosp-Admission (Discharged) from 02/11/2019 in [facility]      Signed         Show:Clear all [x] Manual[x] Template[] Copied  Added by: [x] Cristina Gong, RN[x] Jamse Arn, MD  [] Hover for details PMR Admission Coordinator Pre-Admission Assessment  Patient: [name] is an 48 y.o., female MRN: [id number] DOB: 03-26-1971 Height: 5' 9"  (175.3 cm) Weight: 99 kg  Insurance Information HMO:     PPO:      PCP:      IPA:      80/20:      OTHER:  PRIMARY: medicaid [place] Access      Policy#: [id number] s      Subscriber: pt Benefits:  Phone #: passport one online     Name: 7/8 Eff. Date: active     MAFCN  Medicaid Application Date:       Case Manager:  Disability Application Date:       Case Worker:   The "Data Collection Information Summary" for patients in Inpatient Rehabilitation Facilities with attached "Privacy Act [place] Records" was provided and verbally reviewed with: N/A  Emergency Contact Information         Contact Information    Name Relation Home Work Mobile   [place] Relative [phone]        Current Medical History  Patient Admitting Diagnosis: left pontine lacunar infarct  History of Present Illness: 48 year old female presented to ED on 7/6 with history of HTN, DM with complaints of right facial numbness 48 hrs previously.  Thought initially an allergic reaction to eating lobster. Patient has not been compliant with taking her HTN or DM meds for two months due to [address] pandemic issues. Had not seen her PCP for refills.  BP 220/116 on admit. CT head followed by MRI shows small left pontine lacunar infarct. CT angiogram showed only mild atherosclerotic changes without large vessel stenosis or occlusion. Hgb A1c 8.8 and LDL  58. ECHO 60 to 65%. No cardiac source of embolism.  Lovenox for VTE prophylaxis. No antithrombotic prior to admit, now on Asa 325 mg daily. Recommend ASA 81 mg and plavix 75 mg daily for 3 weeks, then Asa alone.  Plan permissive HTN but gradually normalize in 5 to 7 days. New med of Lipitor .Patient given amlodipine as well as Hydralazine. Recommend restarting home lisinopril/HCTZ in the next 2 to 3 days.Patient on 70/30 and glipizide as an outpt. Controlled with Lantus and SSI while inpt.     Patient does admit also to noncompliance with CPAP and smoking marijuana.  Complete NIHSS TOTAL: 1  [name] medical record from [facility]  has been reviewed by the rehabilitation admission coordinator and physician.  Past Medical History      Past Medical History:  Diagnosis Date  . Arthritis   . Diabetes mellitus   . Hypertension   . Pseudotumor cerebri     Family History   family history includes Asthma in an other family member; Cancer in an other family member; Hyperlipidemia in an other family member; Hypertension in her father and another family member; Kidney disease in her father.  Prior Rehab/Hospitalizations Has the patient had prior rehab or hospitalizations prior to admission? Yes  Has the patient had major surgery during 100 days prior to admission? No  Current Medications  Current Facility-Administered Medications:  .  acetaminophen (TYLENOL) tablet 650 mg, 650 mg, Oral, Q4H PRN **OR** acetaminophen (TYLENOL) solution 650 mg, 650 mg, Per Tube, Q4H PRN **OR** acetaminophen (TYLENOL) suppository 650 mg, 650 mg, Rectal, Q4H PRN, Rise Patience, MD .  albuterol (PROVENTIL) (2.5 MG/3ML) 0.083% nebulizer solution 3 mL, 3 mL, Inhalation, Q6H PRN, Rise Patience, MD .  amLODipine (NORVASC) tablet 10 mg, 10 mg, Oral, Daily, Rise Patience, MD, 10 mg at 02/13/19 0915 .  aspirin EC tablet 81 mg, 81 mg, Oral, Daily, Biby, Sharon L, NP, 81  mg at 02/13/19 0915 .  atorvastatin (LIPITOR) tablet 40 mg, 40 mg, Oral, q1800, Biby, Sharon L, NP, 40 mg at 02/12/19 1729 .  clopidogrel (PLAVIX) tablet 75 mg, 75 mg, Oral, Daily, Biby, Sharon L, NP, 75 mg at 02/13/19 0915 .  enoxaparin (LOVENOX) injection 40 mg, 40 mg, Subcutaneous, Q24H, Rise Patience, MD, 40 mg at 02/13/19 0915 .  hydrALAZINE (APRESOLINE) injection 5 mg, 5 mg, Intravenous, Q4H PRN, Rise Patience, MD, 5 mg at 02/13/19 0915 .  insulin aspart (novoLOG) injection 0-9 Units, 0-9 Units, Subcutaneous, TID WC, Rise Patience, MD, 2 Units at 02/13/19 1256 .  insulin glargine (LANTUS) injection 10 Units, 10 Units, Subcutaneous, BID, Geradine Girt, DO, 10 Units at 02/13/19 0916 .  magnesium sulfate IVPB 2 g 50 mL, 2 g, Intravenous, Once, Mariel Aloe, MD .  ondansetron [facility]) injection 4 mg, 4 mg, Intravenous, Q6H PRN, Eliseo Squires, Jessica U, DO, 4 mg at 02/12/19 0808 .  potassium chloride SA (K-DUR) CR tablet 40 mEq, 40 mEq, Oral, Once, Mariel Aloe, MD  Patients Current Diet:  Diet Order                  Diet - low sodium heart healthy         Diet Carb Modified Fluid consistency: Thin; Room service appropriate? Yes  Diet effective now               Precautions / Restrictions Precautions Precautions: Fall, Other (comment)(monitor HR and BP) Restrictions Weight Bearing Restrictions: No   Has the patient had 2 or more falls or a fall with injury in the past year? No  Prior Activity Level Community (5-7x/wk): Independent  Prior Functional Level Self Care: Did the patient need help bathing, dressing, using the toilet or eating? Independent  Indoor Mobility: Did the patient need assistance with walking from room to room (with or without device)? Independent  Stairs: Did the patient need assistance with internal or external stairs (with or without device)? Independent  Functional Cognition: Did the patient need help planning  regular tasks such as shopping or remembering to take medications? Independent  Home Assistive Devices / Equipment Home Assistive Devices/Equipment: Eyeglasses Home Equipment: None  Prior Device Use: Indicate devices/aids used by the patient prior to current illness, exacerbation or injury? None of the above  Current Functional Level Cognition  Arousal/Alertness: Awake/alert Overall Cognitive Status: No family/caregiver present to determine baseline cognitive functioning Orientation Level: Oriented X4 General Comments: overall appears WFL for basic tasks however decreased safety noted  Attention: Focused, Sustained Focused Attention: Appears intact Sustained Attention: Appears intact Memory: Impaired Memory Impairment: Retrieval deficit, Decreased recall of new information, Storage deficit(Immediate: 3/3; delayed: 0/3; with cues: 3/3) Awareness: Appears intact Problem Solving: Appears intact    Extremity Assessment (includes Sensation/Coordination)  Upper Extremity Assessment: Overall WFL for tasks assessed  Lower Extremity Assessment: Defer  to PT evaluation    ADLs  Overall ADL's : Needs assistance/impaired Eating/Feeding: Modified independent, Sitting Grooming: Wash/dry hands, Wash/dry face, Oral care, Minimal assistance, Min guard, Standing Grooming Details (indicate cue type and reason): close minguard to minA for standing balance Upper Body Bathing: Min guard, Sitting Lower Body Bathing: Minimal assistance, Sit to/from stand Upper Body Dressing : Min guard, Set up, Sitting Lower Body Dressing: Minimal assistance, Sit to/from stand Lower Body Dressing Details (indicate cue type and reason): minA standing balance; pt able to readjust socks seated EOB using figure 4 Toilet Transfer: Minimal assistance, Ambulation, Regular Toilet, RW, Grab bars Toileting- Clothing Manipulation and Hygiene: Minimal assistance, Min guard, Sit to/from stand Toileting - Clothing  Manipulation Details (indicate cue type and reason): close minguard-minA for clothing management including gown and underwear (in standing); performed peri-care via lateral leans  Functional mobility during ADLs: Minimal assistance, Rolling walker    Mobility  Overal bed mobility: Modified Independent Bed Mobility: Supine to Sit, Sit to Supine Supine to sit: Min guard Sit to supine: Min guard General bed mobility comments: increased time and effort needed    Transfers  Overall transfer level: Needs assistance Equipment used: 1 person hand held assist Transfers: Sit to/from Stand Sit to Stand: Min assist General transfer comment: assist to steady upon standing; no assist needed to power up into standing     Ambulation / Gait / Stairs / Wheelchair Mobility  Ambulation/Gait Ambulation/Gait assistance: Herbalist (Feet): 150 Feet Assistive device: Straight cane, 1 person hand held assist Gait Pattern/deviations: Step-through pattern, Decreased step length - left, Decreased stance time - right, Decreased weight shift to right, Drifts right/left General Gait Details: initially ambulating with HHA +1 and then with SPC; assistance at trunk with use of gait belt required throughout; cues for sequencing of gait with use AD and assistance for balance required; pt with decreased R foot clearance and instability with LOB X2; pt is unable to tolerate challenges including horizontal/vertical head turns without significant gait deviations Gait velocity: reduced Gait velocity interpretation: <1.31 ft/sec, indicative of household ambulator    Posture / Balance Balance Overall balance assessment: Needs assistance Sitting-balance support: Feet supported Sitting balance-Leahy Scale: Fair Standing balance support: During functional activity, Single extremity supported Standing balance-Leahy Scale: Poor Standing balance comment: able to maintain static standing briefly with close  minguard    Special needs/care consideration BiPAP/CPAP  N/a CPM  N/a Continuous Drip IV  N/a Dialysis  N/a Life Vest  N/a Oxygen  N/a Special Bed  N/a Trach Size  N/a Wound Vac n/a Skin  intact Bowel mgmt:  intact Bladder mgmt:  continent Diabetic mgmt:  continent Behavioral consideration patient reports anxiety since COVID Chemo/radiation  N/a   Previous Home Environment  Living Arrangements: (lives with children 28, 24 and 67 years old)  Lives With: Family Available Help at Discharge: Family, Available PRN/intermittently Type of Home: Apartment Home Layout: One level Home Access: Level entry Bathroom Shower/Tub: Tub/shower unit, Architectural technologist: Standard Bathroom Accessibility: Yes How Accessible: Accessible via walker Home Care Services: No  Discharge Living Setting Plans for Discharge Living Setting: Apartment Type of Home at Discharge: Apartment Discharge Home Layout: One level Discharge Home Access: Level entry Discharge Bathroom Shower/Tub: Tub/shower unit, Curtain Discharge Bathroom Toilet: Standard Discharge Bathroom Accessibility: Yes How Accessible: Accessible via walker Does the patient have any problems obtaining your medications?: No  Social/Family/Support Systems Patient Roles: Parent(4 children, 11, 12, 18 and 25 ) Contact Information: cousin, Watt Climes Anticipated  Caregiver: adult child 12 Anticipated Caregiver's Contact Information: see above Caregiver Availability: Intermittent Discharge Plan Discussed with Primary Caregiver: Yes Is Caregiver In Agreement with Plan?: Yes Does Caregiver/Family have Issues with Lodging/Transportation while Pt is in Rehab?: No  Goals/Additional Needs Patient/Family Goal for Rehab: Mod I with PT, OT, and SLP Expected length of stay: ELOS 5 to 7 days Pt/Family Agrees to Admission and willing to participate: Yes Program Orientation Provided & Reviewed with Pt/Caregiver Including Roles  &  Responsibilities: Yes  Decrease burden of Care through IP rehab admission: n/a  Possible need for SNF placement upon discharge:  Not anticipated  Patient Condition: I have reviewed medical records from [facility] , spoken with CM, and patient. I met with patient at the bedside for inpatient rehabilitation assessment.  Patient will benefit from ongoing PT, OT and SLP, can actively participate in 3 hours of therapy a day 5 days of the week, and can make measurable gains during the admission.  Patient will also benefit from the coordinated team approach during an Inpatient Acute Rehabilitation admission.  The patient will receive intensive therapy as well as Rehabilitation physician, nursing, social worker, and care management interventions.  Due to bladder management, bowel management, safety, skin/wound care, disease management, medication administration, pain management and patient education the patient requires 24 hour a day rehabilitation nursing.  The patient is currently min assist with mobility and basic ADLs.  Discharge setting and therapy post discharge at home with home health is anticipated.  Patient has agreed to participate in the Acute Inpatient Rehabilitation Program and will admit today.  Preadmission Screen Completed By:  Cleatrice Burke RN MSN, 02/13/2019 1:36 PM ______________________________________________________________________   Discussed status with Dr. Posey Pronto  on 02/13/2019  at 1350 and received approval for admission today.  Admission Coordinator:  Cleatrice Burke, RN, time  1350 Date  02/13/2019   Assessment/Plan: Diagnosis: left pontine lacunar infarct  1. Does the need for close, 24 hr/day Medical supervision in concert with the patient's rehab needs make it unreasonable for this patient to be served in a less intensive setting? Yes  2. Co-Morbidities requiring supervision/potential complications: HTN, DM 3. Due to bladder management, bowel  management, safety, skin/wound care, disease management, medication administration and patient education, does the patient require 24 hr/day rehab nursing? Yes 4. Does the patient require coordinated care of a physician, rehab nurse, PT (1-2 hrs/day, 5 days/week), OT (1-2 hrs/day, 5 days/week) and SLP (1-2 hrs/day, 5 days/week) to address physical and functional deficits in the context of the above medical diagnosis(es)? Yes Addressing deficits in the following areas: balance, endurance, locomotion, strength, transferring, bathing, dressing, toileting, cognition, speech and psychosocial support 5. Can the patient actively participate in an intensive therapy program of at least 3 hrs of therapy 5 days a week? Yes 6. The potential for patient to make measurable gains while on inpatient rehab is excellent 7. Anticipated functional outcomes upon discharge from inpatients are: modified independent and supervision PT, modified independent and supervision OT, modified independent and supervision SLP 8. Estimated rehab length of stay to reach the above functional goals is: 5-7 days. 9. Anticipated D/C setting: Home 10. Anticipated post D/C treatments: HH therapy and Home excercise program 11. Overall Rehab/Functional Prognosis: good  MD Signature: Delice Lesch, MD, ABPMR        Revision History

## 2019-02-13 NOTE — Progress Notes (Signed)
Inpatient Rehabilitation  Patient information reviewed and entered into eRehab system by Aalaiyah Yassin M. Sascha Baugher, M.A., CCC/SLP, PPS Coordinator.  Information including medical coding, functional ability and quality indicators will be reviewed and updated through discharge.    

## 2019-02-13 NOTE — Progress Notes (Signed)
RT went to place patient on CPAP.  Patient was not ready at the time.  Patient instructed to have RN call when ready to be placed on CPAP.  Patient showed understanding.

## 2019-02-13 NOTE — Discharge Instructions (Signed)
[name],  Admitted because of some right-sided facial numbness.  This is secondary to an acute stroke.  The neurologist has evaluated you and is recommended therapy including aspirin and Plavix you only take the Plavix for 30 days.  You will be discharged to inpatient rehab to improve your physical ability prior to discharge.  You also go home with Lipitor which lowers your blood cholesterol recommend discontinuing glipizide clinic hypoglycemia worse when used insulin. You will need close follow-up.

## 2019-02-13 NOTE — H&P (Signed)
Physical Medicine and Rehabilitation Admission H&P    Chief Complaint  Patient presents with   Functional deficits due to stroke.     HPI: [name] is a 48 year old female with history of HTN, T2DM with neuropathy,  OSA- non compliant with CPAP, pseudotumor cerebri;  who was admitted to ED on 02/11/19 with persistent right facial numbness X 48 hours and malignant HTN with BP 220/116.  History taken from chart review and patient.  Patient noted to be out of her medications for more than 2 months due to [place]. MRI brain done revealing patchy small volume nonhemorrhagic infarct in left lateral midbrain/pons and small vessel disease.  CTA head/neck showed athrosclerotic changes with diffuse irregularity of distal branch throughout circle of Willis.  Echocardiogram with ejection fraction of 60-65% with severe concentric LVH. Dr. Leonie Man felt that stroke was due to small vessel disease and recommended ASA/Plavix X 3 weeks followed by ASA alone as well as compliance with CPAP use and cessation of THC use. She was also noted to be  dehydrated with BUN/SCr 22/1.58 as well as hypokalemic with low Mg levels- 1.4. Treated with IVF with improvement in renal status. Patient with resultant balance deficits and RLE proprioceptive deficits. CIR recommended due to functional decline.  Please see preadmission assessment from earlier today as well.   Review of Systems  Constitutional: Negative for chills and fever.  HENT: Negative for hearing loss and tinnitus.   Eyes: Negative for blurred vision and double vision.  Respiratory: Negative for cough and shortness of breath.   Cardiovascular: Negative for chest pain and palpitations.  Gastrointestinal: Positive for constipation. Negative for heartburn and nausea.  Genitourinary: Negative for dysuria and urgency.  Musculoskeletal: Negative for myalgias.  Skin: Negative for itching and rash.  Neurological: Positive for sensory change (Neuropathy with  decreased sensation BLE. Now with numbness right face. ) and weakness. Negative for dizziness, focal weakness and headaches.  Psychiatric/Behavioral: The patient is nervous/anxious and has insomnia (due to SOB at nights).   All other systems reviewed and are negative.    Past Medical History:  Diagnosis Date   Arthritis    Diabetes mellitus    Hypertension    Pseudotumor cerebri     Past Surgical History:  Procedure Laterality Date   ACHILLES TENDON REPAIR      Family History  Problem Relation Age of Onset   Asthma Mother    Hypertension Father    Kidney disease Father    Asthma Other    Hyperlipidemia Other    Hypertension Other    Cancer Other    Diabetes Maternal Grandmother     Social History: Single. Lives with children and independent PTA. She  reports that she quit smoking a year ago-- used to smoke about 0.50 packs per day. She quit smokeless tobacco use about 2 years ago. She uses alcohol on rare occasion--last 7 months ago. She reports current drug use. Drug: Marijuana.    Allergies: No Known Allergies    Medications Prior to Admission  Medication Sig Dispense Refill   albuterol (VENTOLIN HFA) 108 (90 Base) MCG/ACT inhaler Inhale into the lungs every 6 (six) hours as needed for wheezing or shortness of breath.     amLODipine (NORVASC) 10 MG tablet Take 10 mg by mouth daily.  3   cholecalciferol (VITAMIN D3) 25 MCG (1000 UT) tablet Take 1,000 Units by mouth daily.     glipiZIDE (GLUCOTROL) 10 MG tablet Take 10 mg by  mouth daily before breakfast.     insulin aspart protamine- aspart (NOVOLOG MIX 70/30) (70-30) 100 UNIT/ML injection Inject 20 Units into the skin 2 (two) times daily with a meal.     [DISCONTINUED] lisinopril-hydrochlorothiazide (PRINZIDE,ZESTORETIC) 20-12.5 MG tablet Take 2 tablets by mouth daily.  3   blood glucose meter kit and supplies KIT Dispense based on patient and insurance preference. Use up to four times daily as  directed. (FOR ICD-9 250.00, 250.01). 1 each 0   ibuprofen (ADVIL,MOTRIN) 800 MG tablet TAKE 1 TABLET (800 MG TOTAL) BY MOUTH EVERY 8 (EIGHT) HOURS AS NEEDED. (Patient not taking: Reported on 01/08/2018) 30 tablet 5    Drug Regimen Review  Drug regimen was reviewed and remains appropriate with no significant issues identified  Home: Home Living Family/patient expects to be discharged to:: Private residence Living Arrangements: (lives with children 24, 34 and 38 years old) Available Help at Discharge: Family, Available PRN/intermittently Type of Home: Apartment Home Access: Level entry Home Layout: One level Bathroom Shower/Tub: Tub/shower unit, Architectural technologist: Programmer, systems: Yes Home Equipment: None  Lives With: Family   Functional History: Prior Function Level of Independence: Independent Comments: home with family and alone at times, lives with 3 sons (oldest son age 63)  Functional Status:  Mobility: Bed Mobility Overal bed mobility: Modified Independent Bed Mobility: Supine to Sit, Sit to Supine Supine to sit: Min guard Sit to supine: Min guard General bed mobility comments: increased time and effort needed Transfers Overall transfer level: Needs assistance Equipment used: 1 person hand held assist Transfers: Sit to/from Stand Sit to Stand: Min assist General transfer comment: assist to steady upon standing; no assist needed to power up into standing  Ambulation/Gait Ambulation/Gait assistance: Min assist Gait Distance (Feet): 150 Feet Assistive device: Straight cane, 1 person hand held assist Gait Pattern/deviations: Step-through pattern, Decreased step length - left, Decreased stance time - right, Decreased weight shift to right, Drifts right/left General Gait Details: initially ambulating with HHA +1 and then with SPC; assistance at trunk with use of gait belt required throughout; cues for sequencing of gait with use AD and assistance for  balance required; pt with decreased R foot clearance and instability with LOB X2; pt is unable to tolerate challenges including horizontal/vertical head turns without significant gait deviations Gait velocity: reduced Gait velocity interpretation: <1.31 ft/sec, indicative of household ambulator    ADL: ADL Overall ADL's : Needs assistance/impaired Eating/Feeding: Modified independent, Sitting Grooming: Wash/dry hands, Wash/dry face, Oral care, Minimal assistance, Min guard, Standing Grooming Details (indicate cue type and reason): close minguard to minA for standing balance Upper Body Bathing: Min guard, Sitting Lower Body Bathing: Minimal assistance, Sit to/from stand Upper Body Dressing : Min guard, Set up, Sitting Lower Body Dressing: Minimal assistance, Sit to/from stand Lower Body Dressing Details (indicate cue type and reason): minA standing balance; pt able to readjust socks seated EOB using figure 4 Toilet Transfer: Minimal assistance, Ambulation, Regular Toilet, RW, Grab bars Toileting- Clothing Manipulation and Hygiene: Minimal assistance, Min guard, Sit to/from stand Toileting - Clothing Manipulation Details (indicate cue type and reason): close minguard-minA for clothing management including gown and underwear (in standing); performed peri-care via lateral leans  Functional mobility during ADLs: Minimal assistance, Rolling walker  Cognition: Cognition Overall Cognitive Status: No family/caregiver present to determine baseline cognitive functioning Arousal/Alertness: Awake/alert Orientation Level: Oriented X4 Attention: Focused, Sustained Focused Attention: Appears intact Sustained Attention: Appears intact Memory: Impaired Memory Impairment: Retrieval deficit, Decreased recall of new information,  Storage deficit(Immediate: 3/3; delayed: 0/3; with cues: 3/3) Awareness: Appears intact Problem Solving: Appears intact Cognition Arousal/Alertness: Awake/alert Behavior During  Therapy: WFL for tasks assessed/performed, Impulsive Overall Cognitive Status: No family/caregiver present to determine baseline cognitive functioning General Comments: overall appears [facility] for basic tasks however decreased safety noted    Blood pressure (!) 162/93, pulse 93, temperature 98.2 F (36.8 C), temperature source Oral, resp. rate 18, height _0  (1.753 m), weight 99 kg, SpO2 100 %. Physical Exam  Nursing note and vitals reviewed. Constitutional: She is oriented to person, place, and time. She appears well-developed. No distress.  Obese  HENT:  Head: Normocephalic and atraumatic.  Eyes: EOM are normal. Right eye exhibits no discharge. Left eye exhibits no discharge.  Respiratory: Effort normal. No respiratory distress.  GI: She exhibits no distension.  Musculoskeletal:        General: No tenderness or edema.  Neurological: She is alert and oriented to person, place, and time.  Speech clear and fluent.  Right face numbness reported to be improving.   Motor: Grossly 5/5 throughout No ataxia/dysmetria bilateral upper extremities  Skin: She is not diaphoretic.  Bilateral feet dry and flaky.  Healed lesions bilateral shins. Toes on right foot contracted and callus under right great toe.  Healed prominent scar on right achilles.   Psychiatric: Her mood appears anxious. She is hyperactive.    Results for orders placed or performed during the hospital encounter of 02/11/19 (from the past 48 hour(s))  Comprehensive metabolic panel     Status: Abnormal   Collection Time: 02/11/19  5:16 PM  Result Value Ref Range   Sodium 137 135 - 145 mmol/L   Potassium 3.0 (L) 3.5 - 5.1 mmol/L   Chloride 100 98 - 111 mmol/L   CO2 26 22 - 32 mmol/L   Glucose, Bld 221 (H) 70 - 99 mg/dL   BUN 22 (H) 6 - 20 mg/dL   Creatinine, Ser 1.58 (H) 0.44 - 1.00 mg/dL   Calcium 8.6 (L) 8.9 - 10.3 mg/dL   Total Protein 7.2 6.5 - 8.1 g/dL   Albumin 3.2 (L) 3.5 - 5.0 g/dL   AST 12 (L) 15 - 41 U/L   ALT 13  0 - 44 U/L   Alkaline Phosphatase 62 38 - 126 U/L   Total Bilirubin 0.5 0.3 - 1.2 mg/dL   GFR calc non Af Amer 39 (L) >60 mL/min   GFR calc Af Amer 45 (L) >60 mL/min   Anion gap 11 5 - 15    Comment: Performed at [facility], [address], [place], [place] [address]  Brain natriuretic peptide     Status: Abnormal   Collection Time: 02/11/19  5:16 PM  Result Value Ref Range   B Natriuretic Peptide 688.3 (H) 0.0 - 100.0 pg/mL    Comment: Performed at [facility] [address], [place], [place] [address]  Troponin I (High Sensitivity)     Status: Abnormal   Collection Time: 02/11/19  5:16 PM  Result Value Ref Range   Troponin I (High Sensitivity) 26 (H) <18 ng/L    Comment: (NOTE) Elevated high sensitivity troponin I (hsTnI) values and significant  changes across serial measurements may suggest ACS but many other  chronic and acute conditions are known to elevate hsTnI results.  Refer to the "Links" section for chest pain algorithms and additional  guidance. Performed at [facility], [place] [address] [place], [place] [address]   CBC  with Differential     Status: None   Collection Time: 02/11/19  5:16 PM  Result Value Ref Range   WBC 9.0 4.0 - 10.5 K/uL   RBC 4.12 3.87 - 5.11 MIL/uL   Hemoglobin 12.6 12.0 - 15.0 g/dL   HCT 36.0 36.0 - 46.0 %   MCV 87.4 80.0 - 100.0 fL   MCH 30.6 26.0 - 34.0 pg   MCHC 35.0 30.0 - 36.0 g/dL   RDW 14.5 11.5 - 15.5 %   Platelets 265 150 - 400 K/uL   nRBC 0.0 0.0 - 0.2 %   Neutrophils Relative % 63 %   Neutro Abs 5.6 1.7 - 7.7 K/uL   Lymphocytes Relative 28 %   Lymphs Abs 2.6 0.7 - 4.0 K/uL   Monocytes Relative 6 %   Monocytes Absolute 0.6 0.1 - 1.0 K/uL   Eosinophils Relative 3 %   Eosinophils Absolute 0.3 0.0 - 0.5 K/uL   Basophils Relative 0 %   Basophils Absolute 0.0 0.0 - 0.1 K/uL   Immature Granulocytes 0 %   Abs Immature Granulocytes 0.02 0.00 - 0.07 K/uL    Comment: Performed at [facility], [address], [place], [place] [address]  Protime-INR     Status: None   Collection Time: 02/11/19  5:16 PM  Result Value Ref Range   Prothrombin Time 12.9 11.4 - 15.2 seconds   INR 1.0 0.8 - 1.2    Comment: (NOTE) INR goal varies based on device and disease states. Performed at [facility], [place] [address], [place], [place] [address]   Urine rapid drug screen (hosp performed)     Status: Abnormal   Collection Time: 02/11/19  5:16 PM  Result Value Ref Range   Opiates NONE DETECTED NONE DETECTED   Cocaine NONE DETECTED NONE DETECTED   Benzodiazepines NONE DETECTED NONE DETECTED   Amphetamines NONE DETECTED NONE DETECTED   Tetrahydrocannabinol POSITIVE (A) NONE DETECTED   Barbiturates NONE DETECTED NONE DETECTED    Comment: (NOTE) DRUG SCREEN FOR MEDICAL PURPOSES ONLY.  IF CONFIRMATION IS NEEDED FOR ANY PURPOSE, NOTIFY LAB WITHIN 5 DAYS. LOWEST DETECTABLE LIMITS FOR URINE DRUG SCREEN Drug Class                     Cutoff (ng/mL) Amphetamine and metabolites    1000 Barbiturate and metabolites    200 Benzodiazepine                 270 Tricyclics and metabolites     300 Opiates and metabolites        300 Cocaine and metabolites        300 THC                            50 Performed at [facility], [place] [address], [place], [place] [address]   Urinalysis, Routine w reflex microscopic     Status: Abnormal   Collection Time: 02/11/19  5:40 PM  Result Value Ref Range   Color, Urine YELLOW YELLOW   APPearance HAZY (A) CLEAR   Specific Gravity, Urine 1.011 1.005 - 1.030   pH 6.0 5.0 - 8.0   Glucose, UA >=500 (A) NEGATIVE mg/dL   Hgb urine dipstick SMALL (A) NEGATIVE   Bilirubin Urine NEGATIVE NEGATIVE   Ketones, ur NEGATIVE NEGATIVE mg/dL   Protein, ur 100 (A) NEGATIVE mg/dL   Nitrite NEGATIVE NEGATIVE  Leukocytes,Ua NEGATIVE NEGATIVE   RBC / HPF 0-5 0 - 5 RBC/hpf   WBC, UA 0-5 0 - 5 WBC/hpf   Bacteria, UA FEW (A) NONE SEEN   Squamous Epithelial / LPF 6-10 0 - 5     Comment: Performed at [facility], [place] [address] [place], [place] [address]  Troponin I (High Sensitivity)     Status: Abnormal   Collection Time: 02/11/19  7:16 PM  Result Value Ref Range   Troponin I (High Sensitivity) 27 (H) <18 ng/L    Comment: (NOTE) Elevated high sensitivity troponin I (hsTnI) values and significant  changes across serial measurements may suggest ACS but many other  chronic and acute conditions are known to elevate hsTnI results.  Refer to the "Links" section for chest pain algorithms and additional  guidance. Performed at [facility], [place] [address], [place], [place] [address]   SARS Coronavirus 2 (CEPHEID - Performed in [facility]), Hosp Order     Status: None   Collection Time: 02/11/19  8:56 PM   Specimen: Nasopharyngeal Swab  Result Value Ref Range   SARS Coronavirus 2 NEGATIVE NEGATIVE    Comment: (NOTE) If result is NEGATIVE SARS-CoV-2 target nucleic acids are NOT DETECTED. The SARS-CoV-2 RNA is generally detectable in upper and lower  respiratory specimens during the acute phase of infection. The lowest  concentration of SARS-CoV-2 viral copies this assay can detect is 250  copies / mL. A negative result does not preclude SARS-CoV-2 infection  and should not be used as the sole basis for treatment or other  patient management decisions.  A negative result may occur with  improper specimen collection / handling, submission of specimen other  than nasopharyngeal swab, presence of viral mutation(s) within the  areas targeted by this assay, and inadequate number of viral copies  (<250 copies / mL). A negative result must be combined with clinical  observations, patient history, and epidemiological information. If result is POSITIVE SARS-CoV-2 target nucleic acids are DETECTED. The SARS-CoV-2 RNA is generally detectable in upper and lower  respiratory specimens dur ing the acute phase of infection.  Positive  results are  indicative of active infection with SARS-CoV-2.  Clinical  correlation with patient history and other diagnostic information is  necessary to determine patient infection status.  Positive results do  not rule out bacterial infection or co-infection with other viruses. If result is PRESUMPTIVE POSTIVE SARS-CoV-2 nucleic acids MAY BE PRESENT.   A presumptive positive result was obtained on the submitted specimen  and confirmed on repeat testing.  While 2019 novel coronavirus  (SARS-CoV-2) nucleic acids may be present in the submitted sample  additional confirmatory testing may be necessary for epidemiological  and / or clinical management purposes  to differentiate between  SARS-CoV-2 and other Sarbecovirus currently known to infect humans.  If clinically indicated additional testing with an alternate test  methodology [phone]) is advised. The SARS-CoV-2 RNA is generally  detectable in upper and lower respiratory sp ecimens during the acute  phase of infection. The expected result is Negative. Fact Sheet for Patients:  StrictlyIdeas.no Fact Sheet for Healthcare Providers: BankingDealers.co.za This test is not yet approved or cleared by the [place] FDA and has been authorized for detection and/or diagnosis of SARS-CoV-2 by FDA under an Emergency Use Authorization (EUA).  This EUA will remain in effect (meaning this test can be used) for the duration of the COVID-19 declaration under Section 564(b)(1) of the Act,  21 U.S.C. section 360bbb-3(b)(1), unless the authorization is terminated or revoked sooner. Performed at [facility], [place] [address], [place], [place] [address]   Glucose, capillary     Status: Abnormal   Collection Time: 02/12/19 12:35 AM  Result Value Ref Range   Glucose-Capillary 214 (H) 70 - 99 mg/dL  Glucose, capillary     Status: Abnormal   Collection Time: 02/12/19  2:19 AM  Result Value Ref Range    Glucose-Capillary 225 (H) 70 - 99 mg/dL  HIV antibody (Routine Testing)     Status: None   Collection Time: 02/12/19  5:37 AM  Result Value Ref Range   HIV Screen 4th Generation wRfx Non Reactive Non Reactive    Comment: (NOTE) Performed At: [facility] [place], [place] [id number] Rush Farmer MD WG:[id number]   CBC     Status: None   Collection Time: 02/12/19  5:37 AM  Result Value Ref Range   WBC 10.3 4.0 - 10.5 K/uL   RBC 4.34 3.87 - 5.11 MIL/uL   Hemoglobin 13.2 12.0 - 15.0 g/dL   HCT 37.3 36.0 - 46.0 %   MCV 85.9 80.0 - 100.0 fL   MCH 30.4 26.0 - 34.0 pg   MCHC 35.4 30.0 - 36.0 g/dL   RDW 14.3 11.5 - 15.5 %   Platelets 267 150 - 400 K/uL   nRBC 0.0 0.0 - 0.2 %    Comment: Performed at [facility], [place] [address] [place], [place] [address]  Comprehensive metabolic panel     Status: Abnormal   Collection Time: 02/12/19  5:37 AM  Result Value Ref Range   Sodium 135 135 - 145 mmol/L   Potassium 3.2 (L) 3.5 - 5.1 mmol/L   Chloride 97 (L) 98 - 111 mmol/L   CO2 24 22 - 32 mmol/L   Glucose, Bld 260 (H) 70 - 99 mg/dL   BUN 16 6 - 20 mg/dL   Creatinine, Ser 1.38 (H) 0.44 - 1.00 mg/dL   Calcium 8.9 8.9 - 10.3 mg/dL   Total Protein 7.2 6.5 - 8.1 g/dL   Albumin 3.3 (L) 3.5 - 5.0 g/dL   AST 14 (L) 15 - 41 U/L   ALT 12 0 - 44 U/L   Alkaline Phosphatase 66 38 - 126 U/L   Total Bilirubin 0.9 0.3 - 1.2 mg/dL   GFR calc non Af Amer 46 (L) >60 mL/min   GFR calc Af Amer 53 (L) >60 mL/min   Anion gap 14 5 - 15    Comment: Performed at [facility] [address] [place], [place] [address]  Hemoglobin A1c     Status: Abnormal   Collection Time: 02/12/19  5:37 AM  Result Value Ref Range   Hgb A1c MFr Bld 8.8 (H) 4.8 - 5.6 %    Comment: (NOTE) Pre diabetes:          5.7%-6.4% Diabetes:              >6.4% Glycemic control for   <7.0% adults with diabetes    Mean Plasma Glucose 205.86 mg/dL    Comment: Performed at [facility] [address], [place], [place] [address]  Lipid panel     Status: Abnormal   Collection Time: 02/12/19  5:37 AM  Result Value Ref Range   Cholesterol 170 0 - 200 mg/dL   Triglycerides 385 (H) <150 mg/dL   HDL 35 (L) >40 mg/dL  Total CHOL/HDL Ratio 4.9 RATIO   VLDL 77 (H) 0 - 40 mg/dL   LDL Cholesterol 58 0 - 99 mg/dL    Comment:        Total Cholesterol/HDL:CHD Risk Coronary Heart Disease Risk Table                     Men   Women  1/2 Average Risk   3.4   3.3  Average Risk       5.0   4.4  2 X Average Risk   9.6   7.1  3 X Average Risk  23.4   11.0        Use the calculated Patient Ratio above and the CHD Risk Table to determine the patient's CHD Risk.        ATP III CLASSIFICATION (LDL):  <100     mg/dL   Optimal  100-129  mg/dL   Near or Above                    Optimal  130-159  mg/dL   Borderline  160-189  mg/dL   High  >190     mg/dL   Very High Performed at [facility] [address], [place], [place] [address]   Glucose, capillary     Status: Abnormal   Collection Time: 02/12/19  6:28 AM  Result Value Ref Range   Glucose-Capillary 238 (H) 70 - 99 mg/dL  Magnesium     Status: Abnormal   Collection Time: 02/12/19 10:16 AM  Result Value Ref Range   Magnesium 1.4 (L) 1.7 - 2.4 mg/dL    Comment: Performed at [facility], [place] [address], [place], [place] [address]  Glucose, capillary     Status: Abnormal   Collection Time: 02/12/19 12:09 PM  Result Value Ref Range   Glucose-Capillary 203 (H) 70 - 99 mg/dL   Comment 1 Notify RN    Comment 2 Document in Chart   Glucose, capillary     Status: Abnormal   Collection Time: 02/12/19  4:18 PM  Result Value Ref Range   Glucose-Capillary 238 (H) 70 - 99 mg/dL   Comment 1 Notify RN    Comment 2 Document in Chart   Glucose, capillary     Status: Abnormal   Collection Time: 02/12/19  9:22 PM  Result Value Ref Range   Glucose-Capillary 190 (H) 70 - 99 mg/dL   Comment 1 Notify RN   Glucose, capillary      Status: Abnormal   Collection Time: 02/13/19  6:23 AM  Result Value Ref Range   Glucose-Capillary 198 (H) 70 - 99 mg/dL   Comment 1 Notify RN   Glucose, capillary     Status: Abnormal   Collection Time: 02/13/19 11:27 AM  Result Value Ref Range   Glucose-Capillary 176 (H) 70 - 99 mg/dL   Ct Angio Head W Or Wo Contrast  Result Date: 02/12/2019 CLINICAL DATA:  Right facial numbness. Blurred vision in the right eye. EXAM: CT ANGIOGRAPHY HEAD AND NECK TECHNIQUE: Multidetector CT imaging of the head and neck was performed using the standard protocol during bolus administration of intravenous contrast. Multiplanar CT image reconstructions and MIPs were obtained to evaluate the vascular anatomy. Carotid stenosis measurements (when applicable) are obtained utilizing NASCET criteria, using the distal internal carotid diameter as the denominator. CONTRAST:  21m OMNIPAQUE IOHEXOL 350 MG/ML SOLN COMPARISON:  MRI brain 02/11/2019 FINDINGS: CT HEAD FINDINGS Brain:  Noncontrast images demonstrate no acute intracranial abnormality. The subtle findings along the posterolateral aspect of the left pons are not discernible by CT. Ventricles are of normal size. Persistent cavum septum pellucidum is again noted. No significant extraaxial fluid collection is present. Brainstem and cerebellum are otherwise unremarkable. Vascular: No hyperdense vessel or unexpected calcification. Skull: Calvarium is intact. No focal lytic or blastic lesions are present. Sinuses: The paranasal sinuses and mastoid air cells are clear. Orbits: The globes and orbits are within normal limits. Mild prominence of the lacrimal glands bilaterally is likely within normal limits. Review of the MIP images confirms the above findings CTA NECK FINDINGS Aortic arch: A 3 vessel arch configuration is present. There is no significant stenosis or vascular calcification at the aortic arch. Right carotid system: The right common carotid artery is within normal  limits. The right carotid bifurcation is normal. There is mild tortuosity of the cervical right ICA without a significant stenosis. Left carotid system: The left common carotid artery is within normal limits. The left carotid bifurcation is unremarkable. There is mild tortuosity of the cervical left ICA without a significant stenosis. Vertebral arteries: The vertebral arteries both originate from the subclavian arteries. There is no significant stenosis at the origin of either vertebral artery. The vertebral arteries are codominant. There is moderate tortuosity in the proximal left vertebral artery just after the origin without significant stenosis. There is moderate tortuosity of both vertebral arteries in the neck without significant stenosis. Skeleton: There straightening of the normal cervical lordosis. Vertebral body heights and alignment are maintained. No focal lytic or blastic lesions are present. Other neck: The soft tissues the neck are otherwise within normal limits. Salivary glands are unremarkable. No focal mucosal or submucosal lesions are present. A 6 mm hypodense nodule is noted posteriorly in the left lobe of the thyroid. No follow-up is necessary. No significant adenopathy is present. Upper chest: The lung apices are clear. Review of the MIP images confirms the above findings CTA HEAD FINDINGS Anterior circulation: Atherosclerotic irregularity is present within the cavernous internal carotid arteries bilaterally. There is no significant stenosis relative to the more distal vessel scratched at there is no significant stenosis of greater than 50% relative to the more distant vessel. There is diffuse vessel irregularity in the right M1 segment. The A1 segments are within normal limits. Anterior communicating artery is patent. MCA bifurcations are intact. The distal small vessel segmental irregularity is present in the ACA and MCA branch vessels bilaterally without a significant proximal stenosis or  occlusion. Posterior circulation: The vertebral arteries are codominant. PICA origins are visualized and normal. The vertebrobasilar junction is normal. The basilar artery is within normal limits. Both posterior cerebral arteries originate from the basilar tip. There is mild narrowing at the origin of the left PCA. There is diffuse distal branch vessel irregularity involving PCA branches bilaterally without a significant proximal stenosis or occlusion. Venous sinuses: The dural sinuses are patent. The straight sinus and deep cerebral veins are intact. Cortical veins are unremarkable. Anatomic variants: None Review of the MIP images confirms the above findings IMPRESSION: 1. Diffuse distal branch vessel irregularity throughout the circle-of-Willis without a significant proximal stenosis or occlusion. 2. Atherosclerotic changes are evident within the cavernous internal carotid arteries and right greater than left M1 segments with diffuse vessel irregularity and mild narrowing of less than 50% relative to the more distal vessels. 3. Tortuosity of the cervical internal carotid arteries and vertebral arteries bilaterally without significant stenosis. This is nonspecific, but  often seen in the setting of chronic hypertension. 4. Brainstem infarct and white matter parenchymal changes noted on MRI are not discernible by CT. Electronically Signed   By: San Morelle M.D.   On: 02/12/2019 04:44   Ct Angio Neck W Or Wo Contrast  Result Date: 02/12/2019 CLINICAL DATA:  Right facial numbness. Blurred vision in the right eye. EXAM: CT ANGIOGRAPHY HEAD AND NECK TECHNIQUE: Multidetector CT imaging of the head and neck was performed using the standard protocol during bolus administration of intravenous contrast. Multiplanar CT image reconstructions and MIPs were obtained to evaluate the vascular anatomy. Carotid stenosis measurements (when applicable) are obtained utilizing NASCET criteria, using the distal internal  carotid diameter as the denominator. CONTRAST:  32m OMNIPAQUE IOHEXOL 350 MG/ML SOLN COMPARISON:  MRI brain 02/11/2019 FINDINGS: CT HEAD FINDINGS Brain: Noncontrast images demonstrate no acute intracranial abnormality. The subtle findings along the posterolateral aspect of the left pons are not discernible by CT. Ventricles are of normal size. Persistent cavum septum pellucidum is again noted. No significant extraaxial fluid collection is present. Brainstem and cerebellum are otherwise unremarkable. Vascular: No hyperdense vessel or unexpected calcification. Skull: Calvarium is intact. No focal lytic or blastic lesions are present. Sinuses: The paranasal sinuses and mastoid air cells are clear. Orbits: The globes and orbits are within normal limits. Mild prominence of the lacrimal glands bilaterally is likely within normal limits. Review of the MIP images confirms the above findings CTA NECK FINDINGS Aortic arch: A 3 vessel arch configuration is present. There is no significant stenosis or vascular calcification at the aortic arch. Right carotid system: The right common carotid artery is within normal limits. The right carotid bifurcation is normal. There is mild tortuosity of the cervical right ICA without a significant stenosis. Left carotid system: The left common carotid artery is within normal limits. The left carotid bifurcation is unremarkable. There is mild tortuosity of the cervical left ICA without a significant stenosis. Vertebral arteries: The vertebral arteries both originate from the subclavian arteries. There is no significant stenosis at the origin of either vertebral artery. The vertebral arteries are codominant. There is moderate tortuosity in the proximal left vertebral artery just after the origin without significant stenosis. There is moderate tortuosity of both vertebral arteries in the neck without significant stenosis. Skeleton: There straightening of the normal cervical lordosis. Vertebral  body heights and alignment are maintained. No focal lytic or blastic lesions are present. Other neck: The soft tissues the neck are otherwise within normal limits. Salivary glands are unremarkable. No focal mucosal or submucosal lesions are present. A 6 mm hypodense nodule is noted posteriorly in the left lobe of the thyroid. No follow-up is necessary. No significant adenopathy is present. Upper chest: The lung apices are clear. Review of the MIP images confirms the above findings CTA HEAD FINDINGS Anterior circulation: Atherosclerotic irregularity is present within the cavernous internal carotid arteries bilaterally. There is no significant stenosis relative to the more distal vessel scratched at there is no significant stenosis of greater than 50% relative to the more distant vessel. There is diffuse vessel irregularity in the right M1 segment. The A1 segments are within normal limits. Anterior communicating artery is patent. MCA bifurcations are intact. The distal small vessel segmental irregularity is present in the ACA and MCA branch vessels bilaterally without a significant proximal stenosis or occlusion. Posterior circulation: The vertebral arteries are codominant. PICA origins are visualized and normal. The vertebrobasilar junction is normal. The basilar artery is within normal limits. Both  posterior cerebral arteries originate from the basilar tip. There is mild narrowing at the origin of the left PCA. There is diffuse distal branch vessel irregularity involving PCA branches bilaterally without a significant proximal stenosis or occlusion. Venous sinuses: The dural sinuses are patent. The straight sinus and deep cerebral veins are intact. Cortical veins are unremarkable. Anatomic variants: None Review of the MIP images confirms the above findings IMPRESSION: 1. Diffuse distal branch vessel irregularity throughout the circle-of-Willis without a significant proximal stenosis or occlusion. 2. Atherosclerotic  changes are evident within the cavernous internal carotid arteries and right greater than left M1 segments with diffuse vessel irregularity and mild narrowing of less than 50% relative to the more distal vessels. 3. Tortuosity of the cervical internal carotid arteries and vertebral arteries bilaterally without significant stenosis. This is nonspecific, but often seen in the setting of chronic hypertension. 4. Brainstem infarct and white matter parenchymal changes noted on MRI are not discernible by CT. Electronically Signed   By: San Morelle M.D.   On: 02/12/2019 04:44   Mr Brain Wo Contrast (neuro Protocol)  Result Date: 02/11/2019 CLINICAL DATA:  Initial evaluation for acute right facial numbness. EXAM: MRI HEAD WITHOUT CONTRAST TECHNIQUE: Multiplanar, multiecho pulse sequences of the brain and surrounding structures were obtained without intravenous contrast. COMPARISON:  Previous brain MRI from 12/16/2005. FINDINGS: Brain: Cerebral volume within normal limits for age. Mild scattered subcentimeter T2/FLAIR hyperintensities seen involving the periventricular and deep white matter both cerebral hemispheres, nonspecific, but most like related chronic microvascular ischemic disease. Chronic microvascular ischemic changes present within the pons as well. Patchy small volume restricted diffusion seen involving the left lateral midbrain/pons (series 5, image 73), consistent with acute ischemic infarct. This measures approximately 1 cm in greatest dimension. No associated hemorrhage or mass effect. No other evidence for acute or subacute ischemia gray-white matter differentiation otherwise maintained. No other areas of chronic cortical infarction. No foci of susceptibility artifact to suggest acute or chronic intracranial hemorrhage. No mass lesion, midline shift or mass effect. No hydrocephalus. No extra-axial fluid collection. Pituitary gland suprasellar region within normal limits. Midline structures  intact. Vascular: Major intracranial vascular flow voids are maintained. Skull and upper cervical spine: Craniocervical junction within normal limits. Bone marrow signal intensity diffusely decreased on T1 weighted imaging, most commonly related to anemia, smoking, or obesity. No focal marrow replacing lesion. No scalp soft tissue abnormality. Sinuses/Orbits: Globes and orbital soft tissues within normal limits. Mild scattered mucosal thickening throughout the paranasal sinuses. No air-fluid level to suggest acute sinusitis. No mastoid effusion. Inner ear structures normal. Other: None. IMPRESSION: 1. Patchy small volume acute ischemic nonhemorrhagic infarct involving the left lateral midbrain/pons. 2. Underlying mild chronic microvascular ischemic disease. Electronically Signed   By: Jeannine Boga M.D.   On: 02/11/2019 19:28   Dg Chest Port 1 View  Result Date: 02/11/2019 CLINICAL DATA:  Hypertension. EXAM: PORTABLE CHEST 1 VIEW COMPARISON:  Radiograph 05/08/2006, no more recent exam. FINDINGS: Mild to moderate cardiomegaly. Normal mediastinal contours. Mild central bronchial thickening. No pulmonary edema, focal airspace disease, pleural effusion or pneumothorax. IMPRESSION: Mild to moderate cardiomegaly. Mild central bronchial thickening. Electronically Signed   By: Keith Rake M.D.   On: 02/11/2019 19:50       Medical Problem List and Plan: 1.  Deficits with mobility, balance, endurance, self-care secondary to left pontine lacunar infarct.  Admit to CIR 2.  Antithrombotics: -DVT/anticoagulation:  Pharmaceutical: Lovenox  -antiplatelet therapy: ASA/Plavix.  3. Pain Management: N/A 4. Mood: LCSW to  follow for evaluation and support.   -antipsychotic agents: N/A 5. Neuropsych: This patient is?  Fully capable of making decisions on her own behalf. 6. Skin/Wound Care: Pressure relief measures. Increase fluid intake.  7. Fluids/Electrolytes/Nutrition: Monitor I/Os. 8. Hypertensive  emergency: Monitor BP tid--continue Norvasc..  Monitor with increased mobility. 9. 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  Monitor with increased mobility. 10.  AKI: Encourage po fluid intake.  CMP ordered for tomorrow a.m. 11. OSA: Continue CPAP at bedtime.  12. Hypomagnesemia/ Hypokalemia: Treated today..  Will add oral dose.  CMP ordered for tomorrow a.m.  Bary Leriche, PA-C 02/13/2019

## 2019-02-13 NOTE — Discharge Summary (Signed)
Physician Discharge Summary  [name]:[id number] DOB: April 07, 1971 DOA: 02/11/2019  PCP: [name], MD  Admit date: 02/11/2019 Discharge date: 02/13/2019  Admitted From: Home Disposition: CIR  Recommendations for Outpatient Follow-up:  1. Follow up with PCP in 1 week 2. Please obtain BMP/CBC in 2-3 days 3. Please refill home medications when discharged from CIR per medicine discharge medication list 4. Please follow up on the following pending results: None  Home Health: CIR Equipment/Devices: None  Discharge Condition: Stable CODE STATUS: Full code Diet recommendation: Heart healthy   Brief/Interim Summary:  Admission HPI written by Rise Patience, MD   HPI: [name] is a 48 y.o. female with history of hypertension, diabetes mellitus presents to the ER with complaints of right facial numbness which happened almost 48 hours prior to presenting to the ER.  Denies any weakness of the upper or lower extremities or any difficulty swallowing or speaking.  Patient thought initially that the numbness was from a allergic reaction to eating lobster.  But the symptoms persisted.  Patient also noticed her blood pressure has been elevated.  Patient had some leftover antihypertensives which she took.  Patient has not been taking her medication for almost 2 months now including her insulin and antihypertensives.  Since she had not visited her PCP for refills.  Patient denies any chest pain or shortness of breath has some mild headache in the occipital area.  ED Course: In the ER patient was able to move all extremities without difficulty.  Blood pressure was markedly elevated with systolic 993 diastolic 716 on presentation.  Patient had CT head followed by MRI of the brain which shows stroke involving the left lateral midbrain and pons.  Neurologist on-call was consulted.  Admitted for further management of acute stroke.  At this time neurologist advised to keep blood  pressure around 967 systolic and diastolic 893.  Labs show potassium of 3 creatinine 1.5 which is increased from baseline with old labs are more than 47 years old.  Patient was given potassium replacement and patient also was given amlodipine which he has usually taken at but not for last 2 months.  Hydralazine 1 dose was given for blood pressure control.  [name] high sensitive troponin was 27 and 26 but denies any chest pain EKG showed nonspecific changes.  Patient after coming also was complaining of some retro-orbital pain on the right side with blurriness.   Hospital course:  Acute CVA Patient with right-sided numbness in the V2 and V3 dermatomes.  MRI was significant for patchy small infarcts in the left lateral midbrain and pons.  CTA head and neck was significant for diffuse distal branch regularities with significant evidence of atherosclerosis and bilateral internal carotid arteries.  Echocardiogram was obtained and was significant for 66 5% ejection fraction with evidence of hypertrophy.  LDL obtained and was 58.  Hemoglobin A1c of 8.8%.  Patient started on aspirin, Plavix, Lipitor.  Plan for aspirin Plavix dual antiplatelet therapy for 3 weeks followed by aspirin alone.  Physical therapy consulted and recommended CIR on discharge.  Patient discharged to CIR for continued rehab prior to discharge home.  Hypertensive urgency Secondary to medication non-adherence. [name] amlodipine restarted prior to discharge. Recommend restarting home lisinopril/hctz in the next 2-3 days.  Hypokalemia Likely made worse secondary to hypomagnesemia. Given potassium supplementation. Given IV magnesium and a dose of oral potassium prior to discharge. Will need repeat BMP and mag in 2-3 days.  Diabetes mellitus, type 2  Uncontrolled with hyperglycemia. Hemoglobin A1C of 8.8%. Patient is on 70/30 and glipizide as an outpatient but has been non-adherent secondary to COVID-19 and hypervigilance of social  distancing. Controlled with Lantus and SSI while inpatient. Recommend to discontinue glipizide on discharge; continue insulin therapy.  AKI Appears to be secondary to pre-renal cause; possible dehydration. Treated with IV fluids and now resolved.  Sleep apnea Continue CPAP  Marijuana use Counseled on cessation during this admission.  History of pseudotumor cerebri Obesity Body mass index is 32.23 kg/m.  Discharge Diagnoses:  Active Problems:   Obstructive sleep apnea   Acute CVA (cerebrovascular accident) ([place])   Hypertensive urgency   ARF (acute renal failure) ([place])   Type 2 diabetes mellitus with vascular disease ([place])    Discharge Instructions   Allergies as of 02/13/2019   No Known Allergies     Medication List    STOP taking these medications   ibuprofen 800 MG tablet Commonly known as: ADVIL     TAKE these medications   albuterol 108 (90 Base) MCG/ACT inhaler Commonly known as: VENTOLIN HFA Inhale into the lungs every 6 (six) hours as needed for wheezing or shortness of breath.   amLODipine 10 MG tablet Commonly known as: NORVASC Take 10 mg by mouth daily.   aspirin 81 MG EC tablet Take 1 tablet (81 mg total) by mouth daily. Start taking on: February 14, 2019   atorvastatin 40 MG tablet Commonly known as: LIPITOR Take 1 tablet (40 mg total) by mouth daily at 6 PM.   blood glucose meter kit and supplies Kit Dispense based on patient and insurance preference. Use up to four times daily as directed. (FOR ICD-9 250.00, 250.01).   cholecalciferol 25 MCG (1000 UT) tablet Commonly known as: VITAMIN D3 Take 1,000 Units by mouth daily.   clopidogrel 75 MG tablet Commonly known as: PLAVIX Take 1 tablet (75 mg total) by mouth daily for 21 days. Start taking on: February 14, 2019   glipiZIDE 10 MG tablet Commonly known as: GLUCOTROL Take 10 mg by mouth daily before breakfast.   insulin aspart protamine- aspart (70-30) 100 UNIT/ML injection Commonly known as:  NOVOLOG MIX 70/30 Inject 20 Units into the skin 2 (two) times daily with a meal.   lisinopril-hydrochlorothiazide 20-12.5 MG tablet Commonly known as: ZESTORETIC Take 2 tablets by mouth daily. Start taking on: February 16, 2019 What changed: These instructions start on February 16, 2019. If you are unsure what to do until then, ask your doctor or other care provider.       No Known Allergies  Consultations:  Neurology   Procedures/Studies: Ct Angio Head W Or Wo Contrast  Result Date: 02/12/2019 CLINICAL DATA:  Right facial numbness. Blurred vision in the right eye. EXAM: CT ANGIOGRAPHY HEAD AND NECK TECHNIQUE: Multidetector CT imaging of the head and neck was performed using the standard protocol during bolus administration of intravenous contrast. Multiplanar CT image reconstructions and MIPs were obtained to evaluate the vascular anatomy. Carotid stenosis measurements (when applicable) are obtained utilizing NASCET criteria, using the distal internal carotid diameter as the denominator. CONTRAST:  10m OMNIPAQUE IOHEXOL 350 MG/ML SOLN COMPARISON:  MRI brain 02/11/2019 FINDINGS: CT HEAD FINDINGS Brain: Noncontrast images demonstrate no acute intracranial abnormality. The subtle findings along the posterolateral aspect of the left pons are not discernible by CT. Ventricles are of normal size. Persistent cavum septum pellucidum is again noted. No significant extraaxial fluid collection is present. Brainstem and cerebellum are otherwise unremarkable. Vascular: No hyperdense  vessel or unexpected calcification. Skull: Calvarium is intact. No focal lytic or blastic lesions are present. Sinuses: The paranasal sinuses and mastoid air cells are clear. Orbits: The globes and orbits are within normal limits. Mild prominence of the lacrimal glands bilaterally is likely within normal limits. Review of the MIP images confirms the above findings CTA NECK FINDINGS Aortic arch: A 3 vessel arch configuration is present.  There is no significant stenosis or vascular calcification at the aortic arch. Right carotid system: The right common carotid artery is within normal limits. The right carotid bifurcation is normal. There is mild tortuosity of the cervical right ICA without a significant stenosis. Left carotid system: The left common carotid artery is within normal limits. The left carotid bifurcation is unremarkable. There is mild tortuosity of the cervical left ICA without a significant stenosis. Vertebral arteries: The vertebral arteries both originate from the subclavian arteries. There is no significant stenosis at the origin of either vertebral artery. The vertebral arteries are codominant. There is moderate tortuosity in the proximal left vertebral artery just after the origin without significant stenosis. There is moderate tortuosity of both vertebral arteries in the neck without significant stenosis. Skeleton: There straightening of the normal cervical lordosis. Vertebral body heights and alignment are maintained. No focal lytic or blastic lesions are present. Other neck: The soft tissues the neck are otherwise within normal limits. Salivary glands are unremarkable. No focal mucosal or submucosal lesions are present. A 6 mm hypodense nodule is noted posteriorly in the left lobe of the thyroid. No follow-up is necessary. No significant adenopathy is present. Upper chest: The lung apices are clear. Review of the MIP images confirms the above findings CTA HEAD FINDINGS Anterior circulation: Atherosclerotic irregularity is present within the cavernous internal carotid arteries bilaterally. There is no significant stenosis relative to the more distal vessel scratched at there is no significant stenosis of greater than 50% relative to the more distant vessel. There is diffuse vessel irregularity in the right M1 segment. The A1 segments are within normal limits. Anterior communicating artery is patent. MCA bifurcations are intact.  The distal small vessel segmental irregularity is present in the ACA and MCA branch vessels bilaterally without a significant proximal stenosis or occlusion. Posterior circulation: The vertebral arteries are codominant. PICA origins are visualized and normal. The vertebrobasilar junction is normal. The basilar artery is within normal limits. Both posterior cerebral arteries originate from the basilar tip. There is mild narrowing at the origin of the left PCA. There is diffuse distal branch vessel irregularity involving PCA branches bilaterally without a significant proximal stenosis or occlusion. Venous sinuses: The dural sinuses are patent. The straight sinus and deep cerebral veins are intact. Cortical veins are unremarkable. Anatomic variants: None Review of the MIP images confirms the above findings IMPRESSION: 1. Diffuse distal branch vessel irregularity throughout the circle-of-Willis without a significant proximal stenosis or occlusion. 2. Atherosclerotic changes are evident within the cavernous internal carotid arteries and right greater than left M1 segments with diffuse vessel irregularity and mild narrowing of less than 50% relative to the more distal vessels. 3. Tortuosity of the cervical internal carotid arteries and vertebral arteries bilaterally without significant stenosis. This is nonspecific, but often seen in the setting of chronic hypertension. 4. Brainstem infarct and white matter parenchymal changes noted on MRI are not discernible by CT. Electronically Signed   By: San Morelle M.D.   On: 02/12/2019 04:44   Ct Angio Neck W Or Wo Contrast  Result Date: 02/12/2019  CLINICAL DATA:  Right facial numbness. Blurred vision in the right eye. EXAM: CT ANGIOGRAPHY HEAD AND NECK TECHNIQUE: Multidetector CT imaging of the head and neck was performed using the standard protocol during bolus administration of intravenous contrast. Multiplanar CT image reconstructions and MIPs were obtained to  evaluate the vascular anatomy. Carotid stenosis measurements (when applicable) are obtained utilizing NASCET criteria, using the distal internal carotid diameter as the denominator. CONTRAST:  83m OMNIPAQUE IOHEXOL 350 MG/ML SOLN COMPARISON:  MRI brain 02/11/2019 FINDINGS: CT HEAD FINDINGS Brain: Noncontrast images demonstrate no acute intracranial abnormality. The subtle findings along the posterolateral aspect of the left pons are not discernible by CT. Ventricles are of normal size. Persistent cavum septum pellucidum is again noted. No significant extraaxial fluid collection is present. Brainstem and cerebellum are otherwise unremarkable. Vascular: No hyperdense vessel or unexpected calcification. Skull: Calvarium is intact. No focal lytic or blastic lesions are present. Sinuses: The paranasal sinuses and mastoid air cells are clear. Orbits: The globes and orbits are within normal limits. Mild prominence of the lacrimal glands bilaterally is likely within normal limits. Review of the MIP images confirms the above findings CTA NECK FINDINGS Aortic arch: A 3 vessel arch configuration is present. There is no significant stenosis or vascular calcification at the aortic arch. Right carotid system: The right common carotid artery is within normal limits. The right carotid bifurcation is normal. There is mild tortuosity of the cervical right ICA without a significant stenosis. Left carotid system: The left common carotid artery is within normal limits. The left carotid bifurcation is unremarkable. There is mild tortuosity of the cervical left ICA without a significant stenosis. Vertebral arteries: The vertebral arteries both originate from the subclavian arteries. There is no significant stenosis at the origin of either vertebral artery. The vertebral arteries are codominant. There is moderate tortuosity in the proximal left vertebral artery just after the origin without significant stenosis. There is moderate  tortuosity of both vertebral arteries in the neck without significant stenosis. Skeleton: There straightening of the normal cervical lordosis. Vertebral body heights and alignment are maintained. No focal lytic or blastic lesions are present. Other neck: The soft tissues the neck are otherwise within normal limits. Salivary glands are unremarkable. No focal mucosal or submucosal lesions are present. A 6 mm hypodense nodule is noted posteriorly in the left lobe of the thyroid. No follow-up is necessary. No significant adenopathy is present. Upper chest: The lung apices are clear. Review of the MIP images confirms the above findings CTA HEAD FINDINGS Anterior circulation: Atherosclerotic irregularity is present within the cavernous internal carotid arteries bilaterally. There is no significant stenosis relative to the more distal vessel scratched at there is no significant stenosis of greater than 50% relative to the more distant vessel. There is diffuse vessel irregularity in the right M1 segment. The A1 segments are within normal limits. Anterior communicating artery is patent. MCA bifurcations are intact. The distal small vessel segmental irregularity is present in the ACA and MCA branch vessels bilaterally without a significant proximal stenosis or occlusion. Posterior circulation: The vertebral arteries are codominant. PICA origins are visualized and normal. The vertebrobasilar junction is normal. The basilar artery is within normal limits. Both posterior cerebral arteries originate from the basilar tip. There is mild narrowing at the origin of the left PCA. There is diffuse distal branch vessel irregularity involving PCA branches bilaterally without a significant proximal stenosis or occlusion. Venous sinuses: The dural sinuses are patent. The straight sinus and deep cerebral veins  are intact. Cortical veins are unremarkable. Anatomic variants: None Review of the MIP images confirms the above findings  IMPRESSION: 1. Diffuse distal branch vessel irregularity throughout the circle-of-Willis without a significant proximal stenosis or occlusion. 2. Atherosclerotic changes are evident within the cavernous internal carotid arteries and right greater than left M1 segments with diffuse vessel irregularity and mild narrowing of less than 50% relative to the more distal vessels. 3. Tortuosity of the cervical internal carotid arteries and vertebral arteries bilaterally without significant stenosis. This is nonspecific, but often seen in the setting of chronic hypertension. 4. Brainstem infarct and white matter parenchymal changes noted on MRI are not discernible by CT. Electronically Signed   By: San Morelle M.D.   On: 02/12/2019 04:44   Mr Brain Wo Contrast (neuro Protocol)  Result Date: 02/11/2019 CLINICAL DATA:  Initial evaluation for acute right facial numbness. EXAM: MRI HEAD WITHOUT CONTRAST TECHNIQUE: Multiplanar, multiecho pulse sequences of the brain and surrounding structures were obtained without intravenous contrast. COMPARISON:  Previous brain MRI from 12/16/2005. FINDINGS: Brain: Cerebral volume within normal limits for age. Mild scattered subcentimeter T2/FLAIR hyperintensities seen involving the periventricular and deep white matter both cerebral hemispheres, nonspecific, but most like related chronic microvascular ischemic disease. Chronic microvascular ischemic changes present within the pons as well. Patchy small volume restricted diffusion seen involving the left lateral midbrain/pons (series 5, image 73), consistent with acute ischemic infarct. This measures approximately 1 cm in greatest dimension. No associated hemorrhage or mass effect. No other evidence for acute or subacute ischemia gray-white matter differentiation otherwise maintained. No other areas of chronic cortical infarction. No foci of susceptibility artifact to suggest acute or chronic intracranial hemorrhage. No mass lesion,  midline shift or mass effect. No hydrocephalus. No extra-axial fluid collection. Pituitary gland suprasellar region within normal limits. Midline structures intact. Vascular: Major intracranial vascular flow voids are maintained. Skull and upper cervical spine: Craniocervical junction within normal limits. Bone marrow signal intensity diffusely decreased on T1 weighted imaging, most commonly related to anemia, smoking, or obesity. No focal marrow replacing lesion. No scalp soft tissue abnormality. Sinuses/Orbits: Globes and orbital soft tissues within normal limits. Mild scattered mucosal thickening throughout the paranasal sinuses. No air-fluid level to suggest acute sinusitis. No mastoid effusion. Inner ear structures normal. Other: None. IMPRESSION: 1. Patchy small volume acute ischemic nonhemorrhagic infarct involving the left lateral midbrain/pons. 2. Underlying mild chronic microvascular ischemic disease. Electronically Signed   By: Jeannine Boga M.D.   On: 02/11/2019 19:28   Dg Chest Port 1 View  Result Date: 02/11/2019 CLINICAL DATA:  Hypertension. EXAM: PORTABLE CHEST 1 VIEW COMPARISON:  Radiograph 05/08/2006, no more recent exam. FINDINGS: Mild to moderate cardiomegaly. Normal mediastinal contours. Mild central bronchial thickening. No pulmonary edema, focal airspace disease, pleural effusion or pneumothorax. IMPRESSION: Mild to moderate cardiomegaly. Mild central bronchial thickening. Electronically Signed   By: Keith Rake M.D.   On: 02/11/2019 19:50     Transthoracic Echocardiogram (02/12/2019) IMPRESSIONS    1. The left ventricle has normal systolic function with an ejection fraction of 60-65%. The cavity size was normal. There is severe concentric left ventricular hypertrophy. Left ventricular diastolic Doppler parameters are consistent with impaired  relaxation.  2. The right ventricle has normal systolic function. The cavity was normal. There is no increase in right  ventricular wall thickness.  3. Small pericardial effusion.  4. The mitral valve is abnormal. Mild thickening of the mitral valve leaflet. No evidence of mitral valve stenosis.  5. The aortic  valve is grossly normal. Mild thickening of the aortic valve. Mild calcification of the aortic valve. Aortic valve regurgitation is mild by color flow Doppler. No stenosis of the aortic valve.  6. There is a small area of calcification on the [place].  7. The aortic root and ascending aorta are normal in size and structure.  8. The interatrial septum was not assessed.   Subjective: Feels better today. Still with numbness of her right face  Discharge Exam: Vitals:   02/13/19 0700 02/13/19 1100  BP: (!) 202/106 (!) 162/93  Pulse: 90 93  Resp: 20 18  Temp: 97.9 F (36.6 C) 98.2 F (36.8 C)  SpO2: 100% 100%   Vitals:   02/13/19 0001 02/13/19 0304 02/13/19 0700 02/13/19 1100  BP: (!) 182/96 (!) 186/105 (!) 202/106 (!) 162/93  Pulse: 80 80 90 93  Resp: 18 18 20 18   Temp: 98 F (36.7 C) 98.2 F (36.8 C) 97.9 F (36.6 C) 98.2 F (36.8 C)  TempSrc: Oral Oral Oral Oral  SpO2: 100% 100% 100% 100%  Weight:      Height:        General: Pt is alert, awake, not in acute distress Cardiovascular: RRR, S1/S2 +, no rubs, no gallops Respiratory: CTA bilaterally, no wheezing, no rhonchi Abdominal: Soft, NT, ND, bowel sounds + Extremities: no edema, no cyanosis Neuro: numbness of right V2, V3 dermatomes. No focal weakness    The results of significant diagnostics from this hospitalization (including imaging, microbiology, ancillary and laboratory) are listed below for reference.     Microbiology: Recent Results (from the past 240 hour(s))  SARS Coronavirus 2 (CEPHEID - Performed in [place] hospital lab), Hosp Order     Status: None   Collection Time: 02/11/19  8:56 PM   Specimen: Nasopharyngeal Swab  Result Value Ref Range Status   SARS Coronavirus 2 NEGATIVE NEGATIVE Final    Comment:  (NOTE) If result is NEGATIVE SARS-CoV-2 target nucleic acids are NOT DETECTED. The SARS-CoV-2 RNA is generally detectable in upper and lower  respiratory specimens during the acute phase of infection. The lowest  concentration of SARS-CoV-2 viral copies this assay can detect is 250  copies / mL. A negative result does not preclude SARS-CoV-2 infection  and should not be used as the sole basis for treatment or other  patient management decisions.  A negative result may occur with  improper specimen collection / handling, submission of specimen other  than nasopharyngeal swab, presence of viral mutation(s) within the  areas targeted by this assay, and inadequate number of viral copies  (<250 copies / mL). A negative result must be combined with clinical  observations, patient history, and epidemiological information. If result is POSITIVE SARS-CoV-2 target nucleic acids are DETECTED. The SARS-CoV-2 RNA is generally detectable in upper and lower  respiratory specimens dur ing the acute phase of infection.  Positive  results are indicative of active infection with SARS-CoV-2.  Clinical  correlation with patient history and other diagnostic information is  necessary to determine patient infection status.  Positive results do  not rule out bacterial infection or co-infection with other viruses. If result is PRESUMPTIVE POSTIVE SARS-CoV-2 nucleic acids MAY BE PRESENT.   A presumptive positive result was obtained on the submitted specimen  and confirmed on repeat testing.  While 2019 novel coronavirus  (SARS-CoV-2) nucleic acids may be present in the submitted sample  additional confirmatory testing may be necessary for epidemiological  and / or clinical management purposes  to  differentiate between  SARS-CoV-2 and other Sarbecovirus currently known to infect humans.  If clinically indicated additional testing with an alternate test  methodology [phone]) is advised. The SARS-CoV-2 RNA is  generally  detectable in upper and lower respiratory sp ecimens during the acute  phase of infection. The expected result is Negative. Fact Sheet for Patients:  StrictlyIdeas.no Fact Sheet for Healthcare Providers: BankingDealers.co.za This test is not yet approved or cleared by the [place] FDA and has been authorized for detection and/or diagnosis of SARS-CoV-2 by FDA under an Emergency Use Authorization (EUA).  This EUA will remain in effect (meaning this test can be used) for the duration of the COVID-19 declaration under Section 564(b)(1) of the Act, 21 U.S.C. section 360bbb-3(b)(1), unless the authorization is terminated or revoked sooner. Performed at [facility], [place] [address] [place], [place] [address]      Labs: BNP (last 3 results) Recent Labs    02/11/19 1716  BNP 762.8*   Basic Metabolic Panel: Recent Labs  Lab 02/11/19 1716 02/12/19 0537 02/12/19 1016  NA 137 135  --   K 3.0* 3.2*  --   CL 100 97*  --   CO2 26 24  --   GLUCOSE 221* 260*  --   BUN 22* 16  --   CREATININE 1.58* 1.38*  --   CALCIUM 8.6* 8.9  --   MG  --   --  1.4*   Liver Function Tests: Recent Labs  Lab 02/11/19 1716 02/12/19 0537  AST 12* 14*  ALT 13 12  ALKPHOS 62 66  BILITOT 0.5 0.9  PROT 7.2 7.2  ALBUMIN 3.2* 3.3*   No results for input(s): LIPASE, AMYLASE in the last 168 hours. No results for input(s): AMMONIA in the last 168 hours. CBC: Recent Labs  Lab 02/11/19 1716 02/12/19 0537  WBC 9.0 10.3  NEUTROABS 5.6  --   HGB 12.6 13.2  HCT 36.0 37.3  MCV 87.4 85.9  PLT 265 267   Cardiac Enzymes: No results for input(s): CKTOTAL, CKMB, CKMBINDEX, TROPONINI in the last 168 hours. BNP: Invalid input(s): POCBNP CBG: Recent Labs  Lab 02/12/19 1209 02/12/19 1618 02/12/19 2122 02/13/19 0623 02/13/19 1127  GLUCAP 203* 238* 190* 198* 176*   D-Dimer No results for input(s): DDIMER in the last 72  hours. Hgb A1c Recent Labs    02/12/19 0537  HGBA1C 8.8*   Lipid Profile Recent Labs    02/12/19 0537  CHOL 170  HDL 35*  LDLCALC 58  TRIG 385*  CHOLHDL 4.9   Thyroid function studies No results for input(s): TSH, T4TOTAL, T3FREE, THYROIDAB in the last 72 hours.  Invalid input(s): FREET3 Anemia work up No results for input(s): VITAMINB12, FOLATE, FERRITIN, TIBC, IRON, RETICCTPCT in the last 72 hours. Urinalysis    Component Value Date/Time   COLORURINE YELLOW 02/11/2019 1740   APPEARANCEUR HAZY (A) 02/11/2019 1740   LABSPEC 1.011 02/11/2019 1740   PHURINE 6.0 02/11/2019 1740   GLUCOSEU >=500 (A) 02/11/2019 1740   HGBUR SMALL (A) 02/11/2019 1740   BILIRUBINUR NEGATIVE 02/11/2019 1740   KETONESUR NEGATIVE 02/11/2019 1740   PROTEINUR 100 (A) 02/11/2019 1740   UROBILINOGEN 0.2 08/25/2011 2043   NITRITE NEGATIVE 02/11/2019 1740   LEUKOCYTESUR NEGATIVE 02/11/2019 1740   Sepsis Labs Invalid input(s): PROCALCITONIN,  WBC,  LACTICIDVEN Microbiology Recent Results (from the past 240 hour(s))  SARS Coronavirus 2 (CEPHEID - Performed in [facility] hospital lab), Hosp Order     Status: None  Collection Time: 02/11/19  8:56 PM   Specimen: Nasopharyngeal Swab  Result Value Ref Range Status   SARS Coronavirus 2 NEGATIVE NEGATIVE Final    Comment: (NOTE) If result is NEGATIVE SARS-CoV-2 target nucleic acids are NOT DETECTED. The SARS-CoV-2 RNA is generally detectable in upper and lower  respiratory specimens during the acute phase of infection. The lowest  concentration of SARS-CoV-2 viral copies this assay can detect is 250  copies / mL. A negative result does not preclude SARS-CoV-2 infection  and should not be used as the sole basis for treatment or other  patient management decisions.  A negative result may occur with  improper specimen collection / handling, submission of specimen other  than nasopharyngeal swab, presence of viral mutation(s) within the  areas  targeted by this assay, and inadequate number of viral copies  (<250 copies / mL). A negative result must be combined with clinical  observations, patient history, and epidemiological information. If result is POSITIVE SARS-CoV-2 target nucleic acids are DETECTED. The SARS-CoV-2 RNA is generally detectable in upper and lower  respiratory specimens dur ing the acute phase of infection.  Positive  results are indicative of active infection with SARS-CoV-2.  Clinical  correlation with patient history and other diagnostic information is  necessary to determine patient infection status.  Positive results do  not rule out bacterial infection or co-infection with other viruses. If result is PRESUMPTIVE POSTIVE SARS-CoV-2 nucleic acids MAY BE PRESENT.   A presumptive positive result was obtained on the submitted specimen  and confirmed on repeat testing.  While 2019 novel coronavirus  (SARS-CoV-2) nucleic acids may be present in the submitted sample  additional confirmatory testing may be necessary for epidemiological  and / or clinical management purposes  to differentiate between  SARS-CoV-2 and other Sarbecovirus currently known to infect humans.  If clinically indicated additional testing with an alternate test  methodology [id number]) is advised. The SARS-CoV-2 RNA is generally  detectable in upper and lower respiratory sp ecimens during the acute  phase of infection. The expected result is Negative. Fact Sheet for Patients:  StrictlyIdeas.no Fact Sheet for Healthcare Providers: BankingDealers.co.za This test is not yet approved or cleared by the [place] FDA and has been authorized for detection and/or diagnosis of SARS-CoV-2 by FDA under an Emergency Use Authorization (EUA).  This EUA will remain in effect (meaning this test can be used) for the duration of the COVID-19 declaration under Section 564(b)(1) of the Act, 21 U.S.C. section  360bbb-3(b)(1), unless the authorization is terminated or revoked sooner. Performed at [facility], [place] [address] [place], [place] [address]      Time coordinating discharge: 35 minutes  SIGNED:   Cordelia Poche, MD [facility] 02/13/2019, 11:56 AM

## 2019-02-13 NOTE — Progress Notes (Signed)
Pt admitted to room 4W01. Pt oriented to floor, room and unit no fall policy. Pt denies any pain or discomfort at this time. Vital signs at baseline. Continue plan of care.   Gerald Stabs, RN

## 2019-02-13 NOTE — Progress Notes (Signed)
Physical Therapy Treatment Patient Details Name: [name] MRN: [id number] DOB: Jan 24, 1971 Today's Date: 02/13/2019    History of Present Illness 48 yo female with onset of R facial numbness and blurring of vision in R eye was admitted and diagosed with L midbrain/pons ischemic infarct.  Was home with kids and alone most of the time, no AD needed.  PMHx:   OSA, CPAP, achilles repair, atherosclerosis internal carotids, HTN, DM, smoker, pseudotumor cranium,     PT Comments    Patient seen for mobility progression. This session focused on gait training with and without AD. Pt requires assistance at trunk and single UE support for OOB mobility. Pt demonstrates impaired balance and unable to tolerate challenges without significant gait deviations. LOB X 2 during session. Pt reports decreased R LE sensation and feeling of "heaviness" while ambulating. Continue to recommend CIR for further skilled PT services to maximize independence and safety with mobility.   Follow Up Recommendations  CIR     Equipment Recommendations  None recommended by PT    Recommendations for Other Services Rehab consult     Precautions / Restrictions Precautions Precautions: Fall;Other (comment)(monitor HR and BP) Restrictions Weight Bearing Restrictions: No    Mobility  Bed Mobility Overal bed mobility: Modified Independent Bed Mobility: Supine to Sit;Sit to Supine           General bed mobility comments: increased time and effort needed  Transfers Overall transfer level: Needs assistance Equipment used: 1 person hand held assist Transfers: Sit to/from Stand Sit to Stand: Min assist         General transfer comment: assist to steady upon standing; no assist needed to power up into standing   Ambulation/Gait Ambulation/Gait assistance: Min assist Gait Distance (Feet): 150 Feet Assistive device: Straight cane;1 person hand held assist Gait Pattern/deviations: Step-through pattern;Decreased  step length - left;Decreased stance time - right;Decreased weight shift to right;Drifts right/left   Gait velocity interpretation: <1.31 ft/sec, indicative of household ambulator General Gait Details: initially ambulating with HHA +1 and then with SPC; assistance at trunk with use of gait belt required throughout; cues for sequencing of gait with use AD and assistance for balance required; pt with decreased R foot clearance and instability with LOB X2; pt is unable to tolerate challenges including horizontal/vertical head turns without significant gait deviations   Stairs             Wheelchair Mobility    Modified Rankin (Stroke Patients Only)       Balance Overall balance assessment: Needs assistance Sitting-balance support: Feet supported Sitting balance-Leahy Scale: Fair     Standing balance support: During functional activity;Single extremity supported Standing balance-Leahy Scale: Poor                              Cognition Arousal/Alertness: Awake/alert Behavior During Therapy: WFL for tasks assessed/performed;Impulsive Overall Cognitive Status: No family/caregiver present to determine baseline cognitive functioning                                 General Comments: overall appears [facility] for basic tasks however decreased safety noted       Exercises      General Comments General comments (skin integrity, edema, etc.): pt with "heaviness" of R LE and decreased sensation      Pertinent Vitals/Pain Pain Assessment: No/denies pain    Home Living  Prior Function            PT Goals (current goals can now be found in the care plan section) Acute Rehab PT Goals Patient Stated Goal: home  Progress towards PT goals: Progressing toward goals    Frequency    Min 5X/week      PT Plan Current plan remains appropriate    Co-evaluation              AM-PAC PT "6 Clicks" Mobility   Outcome  Measure  Help needed turning from your back to your side while in a flat bed without using bedrails?: None Help needed moving from lying on your back to sitting on the side of a flat bed without using bedrails?: A Little Help needed moving to and from a bed to a chair (including a wheelchair)?: A Little Help needed standing up from a chair using your arms (e.g., wheelchair or bedside chair)?: A Little Help needed to walk in hospital room?: A Little Help needed climbing 3-5 steps with a railing? : A Lot 6 Click Score: 18    End of Session Equipment Utilized During Treatment: Gait belt Activity Tolerance: Patient tolerated treatment well Patient left: in bed;with call bell/phone within reach;with bed alarm set Nurse Communication: Mobility status PT Visit Diagnosis: Muscle weakness (generalized) (M62.81);Ataxic gait (R26.0)     Time: 0762-2633 PT Time Calculation (min) (ACUTE ONLY): 27 min  Charges:  $Gait Training: 23-37 mins                     Earney Navy, PTA Acute Rehabilitation Services Pager: [phone] Office: [phone]     Darliss Cheney 02/13/2019, 11:23 AM

## 2019-02-13 NOTE — Progress Notes (Signed)
Inpatient Rehabilitation Admissions Coordinator  I met with patient and discussed with PTA  pt's progress. Pt in agreement to short LOS for Cir admit. I have a bed available today and contacted Dr. Lonny Prude for d/c order. I will make the arrangements to admit today.  Danne Baxter, RN, MSN Rehab Admissions Coordinator [phone] 02/13/2019 1:23 PM

## 2019-02-13 NOTE — PMR Pre-admission (Signed)
PMR Admission Coordinator Pre-Admission Assessment  Patient: [name] is an 48 y.o., female MRN: [id number] DOB: Feb 28, 1971 Height: _0  (175.3 cm) Weight: 99 kg  Insurance Information HMO:     PPO:      PCP:      IPA:      80/20:      OTHER:  PRIMARY: medicaid [place] Access      Policy#: [id number] s      Subscriber: pt Benefits:  Phone #: passport one online     Name: 7/8 Eff. Date: active     MAFCN  Medicaid Application Date:       Case Manager:  Disability Application Date:       Case Worker:   The "Data Collection Information Summary" for patients in Inpatient Rehabilitation Facilities with attached "Privacy Act Platter Records" was provided and verbally reviewed with: N/A  Emergency Contact Information Contact Information    Name Relation Home Work Mobile   [place] Relative [phone]        Current Medical History  Patient Admitting Diagnosis: left pontine lacunar infarct  History of Present Illness: 48 year old female presented to ED on 7/6 with history of HTN, DM with complaints of right facial numbness 48 hrs previously.  Thought initially an allergic reaction to eating lobster. Patient has not been compliant with taking her HTN or DM meds for two months due to [place] pandemic issues. Had not seen her PCP for refills.  BP 220/116 on admit. CT head followed by MRI shows small left pontine lacunar infarct. CT angiogram showed only mild atherosclerotic changes without large vessel stenosis or occlusion. Hgb A1c 8.8 and LDL 58. ECHO 60 to 65%. No cardiac source of embolism.  Lovenox for VTE prophylaxis. No antithrombotic prior to admit, now on Asa 325 mg daily. Recommend ASA 81 mg and plavix 75 mg daily for 3 weeks, then Asa alone.  Plan permissive HTN but gradually normalize in 5 to 7 days. New med of Lipitor .Patient given amlodipine as well as Hydralazine. Recommend restarting home lisinopril/HCTZ in the next 2 to 3 days.Patient on 70/30 and  glipizide as an outpt. Controlled with Lantus and SSI while inpt.     Patient does admit also to noncompliance with CPAP and smoking marijuana.  Complete NIHSS TOTAL: 1  [name] medical record from [facility]  has been reviewed by the rehabilitation admission coordinator and physician.  Past Medical History  Past Medical History:  Diagnosis Date  . Arthritis   . Diabetes mellitus   . Hypertension   . Pseudotumor cerebri     Family History   family history includes Asthma in an other family member; Cancer in an other family member; Hyperlipidemia in an other family member; Hypertension in her father and another family member; Kidney disease in her father.  Prior Rehab/Hospitalizations Has the patient had prior rehab or hospitalizations prior to admission? Yes  Has the patient had major surgery during 100 days prior to admission? No   Current Medications  Current Facility-Administered Medications:  .  acetaminophen (TYLENOL) tablet 650 mg, 650 mg, Oral, Q4H PRN **OR** acetaminophen (TYLENOL) solution 650 mg, 650 mg, Per Tube, Q4H PRN **OR** acetaminophen (TYLENOL) suppository 650 mg, 650 mg, Rectal, Q4H PRN, Rise Patience, MD .  albuterol (PROVENTIL) (2.5 MG/3ML) 0.083% nebulizer solution 3 mL, 3 mL, Inhalation, Q6H PRN, Rise Patience, MD .  amLODipine (NORVASC) tablet 10 mg, 10 mg, Oral, Daily, Rise Patience, MD, 10 mg at  02/13/19 0915 .  aspirin EC tablet 81 mg, 81 mg, Oral, Daily, Biby, Sharon L, NP, 81 mg at 02/13/19 0915 .  atorvastatin (LIPITOR) tablet 40 mg, 40 mg, Oral, q1800, Biby, Sharon L, NP, 40 mg at 02/12/19 1729 .  clopidogrel (PLAVIX) tablet 75 mg, 75 mg, Oral, Daily, Biby, Sharon L, NP, 75 mg at 02/13/19 0915 .  enoxaparin (LOVENOX) injection 40 mg, 40 mg, Subcutaneous, Q24H, Rise Patience, MD, 40 mg at 02/13/19 0915 .  hydrALAZINE (APRESOLINE) injection 5 mg, 5 mg, Intravenous, Q4H PRN, Rise Patience, MD, 5 mg at  02/13/19 0915 .  insulin aspart (novoLOG) injection 0-9 Units, 0-9 Units, Subcutaneous, TID WC, Rise Patience, MD, 2 Units at 02/13/19 1256 .  insulin glargine (LANTUS) injection 10 Units, 10 Units, Subcutaneous, BID, Geradine Girt, DO, 10 Units at 02/13/19 0916 .  magnesium sulfate IVPB 2 g 50 mL, 2 g, Intravenous, Once, Mariel Aloe, MD .  ondansetron [facility]) injection 4 mg, 4 mg, Intravenous, Q6H PRN, Eliseo Squires, Jessica U, DO, 4 mg at 02/12/19 0808 .  potassium chloride SA (K-DUR) CR tablet 40 mEq, 40 mEq, Oral, Once, Mariel Aloe, MD  Patients Current Diet:  Diet Order            Diet - low sodium heart healthy        Diet Carb Modified Fluid consistency: Thin; Room service appropriate? Yes  Diet effective now              Precautions / Restrictions Precautions Precautions: Fall, Other (comment)(monitor HR and BP) Restrictions Weight Bearing Restrictions: No   Has the patient had 2 or more falls or a fall with injury in the past year? No  Prior Activity Level Community (5-7x/wk): Independent  Prior Functional Level Self Care: Did the patient need help bathing, dressing, using the toilet or eating? Independent  Indoor Mobility: Did the patient need assistance with walking from room to room (with or without device)? Independent  Stairs: Did the patient need assistance with internal or external stairs (with or without device)? Independent  Functional Cognition: Did the patient need help planning regular tasks such as shopping or remembering to take medications? Independent  Home Assistive Devices / Equipment Home Assistive Devices/Equipment: Eyeglasses Home Equipment: None  Prior Device Use: Indicate devices/aids used by the patient prior to current illness, exacerbation or injury? None of the above  Current Functional Level Cognition  Arousal/Alertness: Awake/alert Overall Cognitive Status: No family/caregiver present to determine baseline cognitive  functioning Orientation Level: Oriented X4 General Comments: overall appears WFL for basic tasks however decreased safety noted  Attention: Focused, Sustained Focused Attention: Appears intact Sustained Attention: Appears intact Memory: Impaired Memory Impairment: Retrieval deficit, Decreased recall of new information, Storage deficit(Immediate: 3/3; delayed: 0/3; with cues: 3/3) Awareness: Appears intact Problem Solving: Appears intact    Extremity Assessment (includes Sensation/Coordination)  Upper Extremity Assessment: Overall WFL for tasks assessed  Lower Extremity Assessment: Defer to PT evaluation    ADLs  Overall ADL's : Needs assistance/impaired Eating/Feeding: Modified independent, Sitting Grooming: Wash/dry hands, Wash/dry face, Oral care, Minimal assistance, Min guard, Standing Grooming Details (indicate cue type and reason): close minguard to minA for standing balance Upper Body Bathing: Min guard, Sitting Lower Body Bathing: Minimal assistance, Sit to/from stand Upper Body Dressing : Min guard, Set up, Sitting Lower Body Dressing: Minimal assistance, Sit to/from stand Lower Body Dressing Details (indicate cue type and reason): minA standing balance; pt able to readjust socks seated  EOB using figure 4 Toilet Transfer: Minimal assistance, Ambulation, Regular Toilet, RW, Grab bars Toileting- Clothing Manipulation and Hygiene: Minimal assistance, Min guard, Sit to/from stand Toileting - Clothing Manipulation Details (indicate cue type and reason): close minguard-minA for clothing management including gown and underwear (in standing); performed peri-care via lateral leans  Functional mobility during ADLs: Minimal assistance, Rolling walker    Mobility  Overal bed mobility: Modified Independent Bed Mobility: Supine to Sit, Sit to Supine Supine to sit: Min guard Sit to supine: Min guard General bed mobility comments: increased time and effort needed    Transfers  Overall  transfer level: Needs assistance Equipment used: 1 person hand held assist Transfers: Sit to/from Stand Sit to Stand: Min assist General transfer comment: assist to steady upon standing; no assist needed to power up into standing     Ambulation / Gait / Stairs / Wheelchair Mobility  Ambulation/Gait Ambulation/Gait assistance: Herbalist (Feet): 150 Feet Assistive device: Straight cane, 1 person hand held assist Gait Pattern/deviations: Step-through pattern, Decreased step length - left, Decreased stance time - right, Decreased weight shift to right, Drifts right/left General Gait Details: initially ambulating with HHA +1 and then with SPC; assistance at trunk with use of gait belt required throughout; cues for sequencing of gait with use AD and assistance for balance required; pt with decreased R foot clearance and instability with LOB X2; pt is unable to tolerate challenges including horizontal/vertical head turns without significant gait deviations Gait velocity: reduced Gait velocity interpretation: <1.31 ft/sec, indicative of household ambulator    Posture / Balance Balance Overall balance assessment: Needs assistance Sitting-balance support: Feet supported Sitting balance-Leahy Scale: Fair Standing balance support: During functional activity, Single extremity supported Standing balance-Leahy Scale: Poor Standing balance comment: able to maintain static standing briefly with close minguard    Special needs/care consideration BiPAP/CPAP  N/a CPM  N/a Continuous Drip IV  N/a Dialysis  N/a Life Vest  N/a Oxygen  N/a Special Bed  N/a Trach Size  N/a Wound Vac n/a Skin  intact Bowel mgmt:  intact Bladder mgmt:  continent Diabetic mgmt:  continent Behavioral consideration patient reports anxiety since COVID Chemo/radiation  N/a   Previous Home Environment  Living Arrangements: (lives with children 31, 34 and 8 years old)  Lives With: Family Available Help at  Discharge: Family, Available PRN/intermittently Type of Home: Apartment Home Layout: One level Home Access: Level entry Bathroom Shower/Tub: Tub/shower unit, Architectural technologist: Standard Bathroom Accessibility: Yes How Accessible: Accessible via walker Home Care Services: No  Discharge Living Setting Plans for Discharge Living Setting: Apartment Type of Home at Discharge: Apartment Discharge Home Layout: One level Discharge Home Access: Level entry Discharge Bathroom Shower/Tub: Tub/shower unit, Curtain Discharge Bathroom Toilet: Standard Discharge Bathroom Accessibility: Yes How Accessible: Accessible via walker Does the patient have any problems obtaining your medications?: No  Social/Family/Support Systems Patient Roles: Parent(4 children, 11, 12, 18 and 25 ) Contact Information: cousin, Watt Climes Anticipated Caregiver: adult child 51 Anticipated Caregiver's Contact Information: see above Caregiver Availability: Intermittent Discharge Plan Discussed with Primary Caregiver: Yes Is Caregiver In Agreement with Plan?: Yes Does Caregiver/Family have Issues with Lodging/Transportation while Pt is in Rehab?: No  Goals/Additional Needs Patient/Family Goal for Rehab: Mod I with PT, OT, and SLP Expected length of stay: ELOS 5 to 7 days Pt/Family Agrees to Admission and willing to participate: Yes Program Orientation Provided & Reviewed with Pt/Caregiver Including Roles  & Responsibilities: Yes  Decrease burden of Care through IP rehab  admission: n/a  Possible need for SNF placement upon discharge:  Not anticipated  Patient Condition: I have reviewed medical records from [facility] , spoken with CM, and patient. I met with patient at the bedside for inpatient rehabilitation assessment.  Patient will benefit from ongoing PT, OT and SLP, can actively participate in 3 hours of therapy a day 5 days of the week, and can make measurable gains during the admission.  Patient  will also benefit from the coordinated team approach during an Inpatient Acute Rehabilitation admission.  The patient will receive intensive therapy as well as Rehabilitation physician, nursing, social worker, and care management interventions.  Due to bladder management, bowel management, safety, skin/wound care, disease management, medication administration, pain management and patient education the patient requires 24 hour a day rehabilitation nursing.  The patient is currently min assist with mobility and basic ADLs.  Discharge setting and therapy post discharge at home with home health is anticipated.  Patient has agreed to participate in the Acute Inpatient Rehabilitation Program and will admit today.  Preadmission Screen Completed By:  Cleatrice Burke RN MSN, 02/13/2019 1:36 PM ______________________________________________________________________   Discussed status with Dr. Posey Pronto  on 02/13/2019  at 1350 and received approval for admission today.  Admission Coordinator:  Cleatrice Burke, RN, time  1350 Date  02/13/2019   Assessment/Plan: Diagnosis: left pontine lacunar infarct  1. Does the need for close, 24 hr/day Medical supervision in concert with the patient's rehab needs make it unreasonable for this patient to be served in a less intensive setting? Yes  2. Co-Morbidities requiring supervision/potential complications: HTN, DM 3. Due to bladder management, bowel management, safety, skin/wound care, disease management, medication administration and patient education, does the patient require 24 hr/day rehab nursing? Yes 4. Does the patient require coordinated care of a physician, rehab nurse, PT (1-2 hrs/day, 5 days/week), OT (1-2 hrs/day, 5 days/week) and SLP (1-2 hrs/day, 5 days/week) to address physical and functional deficits in the context of the above medical diagnosis(es)? Yes Addressing deficits in the following areas: balance, endurance, locomotion, strength, transferring,  bathing, dressing, toileting, cognition, speech and psychosocial support 5. Can the patient actively participate in an intensive therapy program of at least 3 hrs of therapy 5 days a week? Yes 6. The potential for patient to make measurable gains while on inpatient rehab is excellent 7. Anticipated functional outcomes upon discharge from inpatients are: modified independent and supervision PT, modified independent and supervision OT, modified independent and supervision SLP 8. Estimated rehab length of stay to reach the above functional goals is: 5-7 days. 9. Anticipated D/C setting: Home 10. Anticipated post D/C treatments: HH therapy and Home excercise program 11. Overall Rehab/Functional Prognosis: good  MD Signature: Delice Lesch, MD, ABPMR

## 2019-02-13 NOTE — TOC Transition Note (Signed)
Transition of Care [facility]) - CM/SW Discharge Note   Patient Details  Name: [name] MRN: [id number] Date of Birth: 05-04-1971  [facility]) CM/SW Contact:  Pollie Friar, RN Phone Number: 02/13/2019, 1:03 PM   Clinical Narrative:    Pt is discharging to CIR today. CM Signing off.   Final next level of care: IP Rehab Facility Barriers to Discharge: No Barriers Identified   Patient Goals and CMS Choice        Discharge Placement                       Discharge Plan and Services                                     Social Determinants of Health (SDOH) Interventions     Readmission Risk Interventions No flowsheet data found.

## 2019-02-13 NOTE — H&P (Signed)
Physical Medicine and Rehabilitation Admission H&P    Chief Complaint  Patient presents with   Functional deficits due to stroke.     HPI: [name] is a 48 year old female with history of HTN, T2DM with neuropathy,  OSA- non compliant with CPAP, pseudotumor cerebri;  who was admitted to ED on 02/11/19 with persistent right facial numbness X 48 hours and malignant HTN with BP 220/116.  History taken from chart review and patient.  Patient noted to be out of her medications for more than 2 months due to [place]. MRI brain done revealing patchy small volume nonhemorrhagic infarct in left lateral midbrain/pons and small vessel disease.  CTA head/neck showed athrosclerotic changes with diffuse irregularity of distal branch throughout circle of Willis.  Echocardiogram with ejection fraction of 60-65% with severe concentric LVH. Dr. Leonie Man felt that stroke was due to small vessel disease and recommended ASA/Plavix X 3 weeks followed by ASA alone as well as compliance with CPAP use and cessation of THC use. She was also noted to be  dehydrated with BUN/SCr 22/1.58 as well as hypokalemic with low Mg levels- 1.4. Treated with IVF with improvement in renal status. Patient with resultant balance deficits and RLE proprioceptive deficits. CIR recommended due to functional decline.  Please see preadmission assessment from earlier today as well.   Review of Systems  Constitutional: Negative for chills and fever.  HENT: Negative for hearing loss and tinnitus.   Eyes: Negative for blurred vision and double vision.  Respiratory: Negative for cough and shortness of breath.   Cardiovascular: Negative for chest pain and palpitations.  Gastrointestinal: Positive for constipation. Negative for heartburn and nausea.  Genitourinary: Negative for dysuria and urgency.  Musculoskeletal: Negative for myalgias.  Skin: Negative for itching and rash.  Neurological: Positive for sensory change (Neuropathy with  decreased sensation BLE. Now with numbness right face. ) and weakness. Negative for dizziness, focal weakness and headaches.  Psychiatric/Behavioral: The patient is nervous/anxious and has insomnia (due to SOB at nights).   All other systems reviewed and are negative.    Past Medical History:  Diagnosis Date   Arthritis    Diabetes mellitus    Hypertension    Pseudotumor cerebri     Past Surgical History:  Procedure Laterality Date   ACHILLES TENDON REPAIR      Family History  Problem Relation Age of Onset   Asthma Mother    Hypertension Father    Kidney disease Father    Asthma Other    Hyperlipidemia Other    Hypertension Other    Cancer Other    Diabetes Maternal Grandmother     Social History: Single. Lives with children and independent PTA. She  reports that she quit smoking a year ago-- used to smoke about 0.50 packs per day. She quit smokeless tobacco use about 2 years ago. She uses alcohol on rare occasion--last 7 months ago. She reports current drug use. Drug: Marijuana.    Allergies: No Known Allergies    Medications Prior to Admission  Medication Sig Dispense Refill   albuterol (VENTOLIN HFA) 108 (90 Base) MCG/ACT inhaler Inhale into the lungs every 6 (six) hours as needed for wheezing or shortness of breath.     amLODipine (NORVASC) 10 MG tablet Take 10 mg by mouth daily.  3   cholecalciferol (VITAMIN D3) 25 MCG (1000 UT) tablet Take 1,000 Units by mouth daily.     glipiZIDE (GLUCOTROL) 10 MG tablet Take 10 mg by  mouth daily before breakfast.     insulin aspart protamine- aspart (NOVOLOG MIX 70/30) (70-30) 100 UNIT/ML injection Inject 20 Units into the skin 2 (two) times daily with a meal.     [DISCONTINUED] lisinopril-hydrochlorothiazide (PRINZIDE,ZESTORETIC) 20-12.5 MG tablet Take 2 tablets by mouth daily.  3   blood glucose meter kit and supplies KIT Dispense based on patient and insurance preference. Use up to four times daily as  directed. (FOR ICD-9 250.00, 250.01). 1 each 0   ibuprofen (ADVIL,MOTRIN) 800 MG tablet TAKE 1 TABLET (800 MG TOTAL) BY MOUTH EVERY 8 (EIGHT) HOURS AS NEEDED. (Patient not taking: Reported on 01/08/2018) 30 tablet 5    Drug Regimen Review  Drug regimen was reviewed and remains appropriate with no significant issues identified  Home: Home Living Family/patient expects to be discharged to:: Private residence Living Arrangements: (lives with children 24, 34 and 38 years old) Available Help at Discharge: Family, Available PRN/intermittently Type of Home: Apartment Home Access: Level entry Home Layout: One level Bathroom Shower/Tub: Tub/shower unit, Architectural technologist: Programmer, systems: Yes Home Equipment: None  Lives With: Family   Functional History: Prior Function Level of Independence: Independent Comments: home with family and alone at times, lives with 3 sons (oldest son age 63)  Functional Status:  Mobility: Bed Mobility Overal bed mobility: Modified Independent Bed Mobility: Supine to Sit, Sit to Supine Supine to sit: Min guard Sit to supine: Min guard General bed mobility comments: increased time and effort needed Transfers Overall transfer level: Needs assistance Equipment used: 1 person hand held assist Transfers: Sit to/from Stand Sit to Stand: Min assist General transfer comment: assist to steady upon standing; no assist needed to power up into standing  Ambulation/Gait Ambulation/Gait assistance: Min assist Gait Distance (Feet): 150 Feet Assistive device: Straight cane, 1 person hand held assist Gait Pattern/deviations: Step-through pattern, Decreased step length - left, Decreased stance time - right, Decreased weight shift to right, Drifts right/left General Gait Details: initially ambulating with HHA +1 and then with SPC; assistance at trunk with use of gait belt required throughout; cues for sequencing of gait with use AD and assistance for  balance required; pt with decreased R foot clearance and instability with LOB X2; pt is unable to tolerate challenges including horizontal/vertical head turns without significant gait deviations Gait velocity: reduced Gait velocity interpretation: <1.31 ft/sec, indicative of household ambulator    ADL: ADL Overall ADL's : Needs assistance/impaired Eating/Feeding: Modified independent, Sitting Grooming: Wash/dry hands, Wash/dry face, Oral care, Minimal assistance, Min guard, Standing Grooming Details (indicate cue type and reason): close minguard to minA for standing balance Upper Body Bathing: Min guard, Sitting Lower Body Bathing: Minimal assistance, Sit to/from stand Upper Body Dressing : Min guard, Set up, Sitting Lower Body Dressing: Minimal assistance, Sit to/from stand Lower Body Dressing Details (indicate cue type and reason): minA standing balance; pt able to readjust socks seated EOB using figure 4 Toilet Transfer: Minimal assistance, Ambulation, Regular Toilet, RW, Grab bars Toileting- Clothing Manipulation and Hygiene: Minimal assistance, Min guard, Sit to/from stand Toileting - Clothing Manipulation Details (indicate cue type and reason): close minguard-minA for clothing management including gown and underwear (in standing); performed peri-care via lateral leans  Functional mobility during ADLs: Minimal assistance, Rolling walker  Cognition: Cognition Overall Cognitive Status: No family/caregiver present to determine baseline cognitive functioning Arousal/Alertness: Awake/alert Orientation Level: Oriented X4 Attention: Focused, Sustained Focused Attention: Appears intact Sustained Attention: Appears intact Memory: Impaired Memory Impairment: Retrieval deficit, Decreased recall of new information,  Storage deficit(Immediate: 3/3; delayed: 0/3; with cues: 3/3) Awareness: Appears intact Problem Solving: Appears intact Cognition Arousal/Alertness: Awake/alert Behavior During  Therapy: WFL for tasks assessed/performed, Impulsive Overall Cognitive Status: No family/caregiver present to determine baseline cognitive functioning General Comments: overall appears [facility] for basic tasks however decreased safety noted    Blood pressure (!) 162/93, pulse 93, temperature 98.2 F (36.8 C), temperature source Oral, resp. rate 18, height _0  (1.753 m), weight 99 kg, SpO2 100 %. Physical Exam  Nursing note and vitals reviewed. Constitutional: She is oriented to person, place, and time. She appears well-developed. No distress.  Obese  HENT:  Head: Normocephalic and atraumatic.  Eyes: EOM are normal. Right eye exhibits no discharge. Left eye exhibits no discharge.  Respiratory: Effort normal. No respiratory distress.  GI: She exhibits no distension.  Musculoskeletal:        General: No tenderness or edema.  Neurological: She is alert and oriented to person, place, and time.  Speech clear and fluent.  Right face numbness reported to be improving.   Motor: Grossly 5/5 throughout No ataxia/dysmetria bilateral upper extremities  Skin: She is not diaphoretic.  Bilateral feet dry and flaky.  Healed lesions bilateral shins. Toes on right foot contracted and callus under right great toe.  Healed prominent scar on right achilles.   Psychiatric: Her mood appears anxious. She is hyperactive.    Results for orders placed or performed during the hospital encounter of 02/11/19 (from the past 48 hour(s))  Comprehensive metabolic panel     Status: Abnormal   Collection Time: 02/11/19  5:16 PM  Result Value Ref Range   Sodium 137 135 - 145 mmol/L   Potassium 3.0 (L) 3.5 - 5.1 mmol/L   Chloride 100 98 - 111 mmol/L   CO2 26 22 - 32 mmol/L   Glucose, Bld 221 (H) 70 - 99 mg/dL   BUN 22 (H) 6 - 20 mg/dL   Creatinine, Ser 1.58 (H) 0.44 - 1.00 mg/dL   Calcium 8.6 (L) 8.9 - 10.3 mg/dL   Total Protein 7.2 6.5 - 8.1 g/dL   Albumin 3.2 (L) 3.5 - 5.0 g/dL   AST 12 (L) 15 - 41 U/L   ALT 13  0 - 44 U/L   Alkaline Phosphatase 62 38 - 126 U/L   Total Bilirubin 0.5 0.3 - 1.2 mg/dL   GFR calc non Af Amer 39 (L) >60 mL/min   GFR calc Af Amer 45 (L) >60 mL/min   Anion gap 11 5 - 15    Comment: Performed at [facility], [address], [place], [place] [address]  Brain natriuretic peptide     Status: Abnormal   Collection Time: 02/11/19  5:16 PM  Result Value Ref Range   B Natriuretic Peptide 688.3 (H) 0.0 - 100.0 pg/mL    Comment: Performed at [facility] [address], [place], [place] [address]  Troponin I (High Sensitivity)     Status: Abnormal   Collection Time: 02/11/19  5:16 PM  Result Value Ref Range   Troponin I (High Sensitivity) 26 (H) <18 ng/L    Comment: (NOTE) Elevated high sensitivity troponin I (hsTnI) values and significant  changes across serial measurements may suggest ACS but many other  chronic and acute conditions are known to elevate hsTnI results.  Refer to the "Links" section for chest pain algorithms and additional  guidance. Performed at [facility], [place] [address] [place], [place] [address]   CBC  with Differential     Status: None   Collection Time: 02/11/19  5:16 PM  Result Value Ref Range   WBC 9.0 4.0 - 10.5 K/uL   RBC 4.12 3.87 - 5.11 MIL/uL   Hemoglobin 12.6 12.0 - 15.0 g/dL   HCT 36.0 36.0 - 46.0 %   MCV 87.4 80.0 - 100.0 fL   MCH 30.6 26.0 - 34.0 pg   MCHC 35.0 30.0 - 36.0 g/dL   RDW 14.5 11.5 - 15.5 %   Platelets 265 150 - 400 K/uL   nRBC 0.0 0.0 - 0.2 %   Neutrophils Relative % 63 %   Neutro Abs 5.6 1.7 - 7.7 K/uL   Lymphocytes Relative 28 %   Lymphs Abs 2.6 0.7 - 4.0 K/uL   Monocytes Relative 6 %   Monocytes Absolute 0.6 0.1 - 1.0 K/uL   Eosinophils Relative 3 %   Eosinophils Absolute 0.3 0.0 - 0.5 K/uL   Basophils Relative 0 %   Basophils Absolute 0.0 0.0 - 0.1 K/uL   Immature Granulocytes 0 %   Abs Immature Granulocytes 0.02 0.00 - 0.07 K/uL    Comment: Performed at [facility], [address], [place], [place] [address]  Protime-INR     Status: None   Collection Time: 02/11/19  5:16 PM  Result Value Ref Range   Prothrombin Time 12.9 11.4 - 15.2 seconds   INR 1.0 0.8 - 1.2    Comment: (NOTE) INR goal varies based on device and disease states. Performed at [facility], [place] [address], [place], [place] [address]   Urine rapid drug screen (hosp performed)     Status: Abnormal   Collection Time: 02/11/19  5:16 PM  Result Value Ref Range   Opiates NONE DETECTED NONE DETECTED   Cocaine NONE DETECTED NONE DETECTED   Benzodiazepines NONE DETECTED NONE DETECTED   Amphetamines NONE DETECTED NONE DETECTED   Tetrahydrocannabinol POSITIVE (A) NONE DETECTED   Barbiturates NONE DETECTED NONE DETECTED    Comment: (NOTE) DRUG SCREEN FOR MEDICAL PURPOSES ONLY.  IF CONFIRMATION IS NEEDED FOR ANY PURPOSE, NOTIFY LAB WITHIN 5 DAYS. LOWEST DETECTABLE LIMITS FOR URINE DRUG SCREEN Drug Class                     Cutoff (ng/mL) Amphetamine and metabolites    1000 Barbiturate and metabolites    200 Benzodiazepine                 270 Tricyclics and metabolites     300 Opiates and metabolites        300 Cocaine and metabolites        300 THC                            50 Performed at [facility], [place] [address], [place], [place] [address]   Urinalysis, Routine w reflex microscopic     Status: Abnormal   Collection Time: 02/11/19  5:40 PM  Result Value Ref Range   Color, Urine YELLOW YELLOW   APPearance HAZY (A) CLEAR   Specific Gravity, Urine 1.011 1.005 - 1.030   pH 6.0 5.0 - 8.0   Glucose, UA >=500 (A) NEGATIVE mg/dL   Hgb urine dipstick SMALL (A) NEGATIVE   Bilirubin Urine NEGATIVE NEGATIVE   Ketones, ur NEGATIVE NEGATIVE mg/dL   Protein, ur 100 (A) NEGATIVE mg/dL   Nitrite NEGATIVE NEGATIVE  Leukocytes,Ua NEGATIVE NEGATIVE   RBC / HPF 0-5 0 - 5 RBC/hpf   WBC, UA 0-5 0 - 5 WBC/hpf   Bacteria, UA FEW (A) NONE SEEN   Squamous Epithelial / LPF 6-10 0 - 5     Comment: Performed at [facility], [place] [address] [place], [place] [address]  Troponin I (High Sensitivity)     Status: Abnormal   Collection Time: 02/11/19  7:16 PM  Result Value Ref Range   Troponin I (High Sensitivity) 27 (H) <18 ng/L    Comment: (NOTE) Elevated high sensitivity troponin I (hsTnI) values and significant  changes across serial measurements may suggest ACS but many other  chronic and acute conditions are known to elevate hsTnI results.  Refer to the "Links" section for chest pain algorithms and additional  guidance. Performed at [facility], [place] [address], [place], [place] [address]   SARS Coronavirus 2 (CEPHEID - Performed in [facility]), Hosp Order     Status: None   Collection Time: 02/11/19  8:56 PM   Specimen: Nasopharyngeal Swab  Result Value Ref Range   SARS Coronavirus 2 NEGATIVE NEGATIVE    Comment: (NOTE) If result is NEGATIVE SARS-CoV-2 target nucleic acids are NOT DETECTED. The SARS-CoV-2 RNA is generally detectable in upper and lower  respiratory specimens during the acute phase of infection. The lowest  concentration of SARS-CoV-2 viral copies this assay can detect is 250  copies / mL. A negative result does not preclude SARS-CoV-2 infection  and should not be used as the sole basis for treatment or other  patient management decisions.  A negative result may occur with  improper specimen collection / handling, submission of specimen other  than nasopharyngeal swab, presence of viral mutation(s) within the  areas targeted by this assay, and inadequate number of viral copies  (<250 copies / mL). A negative result must be combined with clinical  observations, patient history, and epidemiological information. If result is POSITIVE SARS-CoV-2 target nucleic acids are DETECTED. The SARS-CoV-2 RNA is generally detectable in upper and lower  respiratory specimens dur ing the acute phase of infection.  Positive  results are  indicative of active infection with SARS-CoV-2.  Clinical  correlation with patient history and other diagnostic information is  necessary to determine patient infection status.  Positive results do  not rule out bacterial infection or co-infection with other viruses. If result is PRESUMPTIVE POSTIVE SARS-CoV-2 nucleic acids MAY BE PRESENT.   A presumptive positive result was obtained on the submitted specimen  and confirmed on repeat testing.  While 2019 novel coronavirus  (SARS-CoV-2) nucleic acids may be present in the submitted sample  additional confirmatory testing may be necessary for epidemiological  and / or clinical management purposes  to differentiate between  SARS-CoV-2 and other Sarbecovirus currently known to infect humans.  If clinically indicated additional testing with an alternate test  methodology [phone]) is advised. The SARS-CoV-2 RNA is generally  detectable in upper and lower respiratory sp ecimens during the acute  phase of infection. The expected result is Negative. Fact Sheet for Patients:  StrictlyIdeas.no Fact Sheet for Healthcare Providers: BankingDealers.co.za This test is not yet approved or cleared by the [place] FDA and has been authorized for detection and/or diagnosis of SARS-CoV-2 by FDA under an Emergency Use Authorization (EUA).  This EUA will remain in effect (meaning this test can be used) for the duration of the COVID-19 declaration under Section 564(b)(1) of the Act,  21 U.S.C. section 360bbb-3(b)(1), unless the authorization is terminated or revoked sooner. Performed at [facility], [place] [address], [place], [place] [address]   Glucose, capillary     Status: Abnormal   Collection Time: 02/12/19 12:35 AM  Result Value Ref Range   Glucose-Capillary 214 (H) 70 - 99 mg/dL  Glucose, capillary     Status: Abnormal   Collection Time: 02/12/19  2:19 AM  Result Value Ref Range    Glucose-Capillary 225 (H) 70 - 99 mg/dL  HIV antibody (Routine Testing)     Status: None   Collection Time: 02/12/19  5:37 AM  Result Value Ref Range   HIV Screen 4th Generation wRfx Non Reactive Non Reactive    Comment: (NOTE) Performed At: [facility] [place], [place] [id number] Rush Farmer MD WG:[id number]   CBC     Status: None   Collection Time: 02/12/19  5:37 AM  Result Value Ref Range   WBC 10.3 4.0 - 10.5 K/uL   RBC 4.34 3.87 - 5.11 MIL/uL   Hemoglobin 13.2 12.0 - 15.0 g/dL   HCT 37.3 36.0 - 46.0 %   MCV 85.9 80.0 - 100.0 fL   MCH 30.4 26.0 - 34.0 pg   MCHC 35.4 30.0 - 36.0 g/dL   RDW 14.3 11.5 - 15.5 %   Platelets 267 150 - 400 K/uL   nRBC 0.0 0.0 - 0.2 %    Comment: Performed at [facility], [place] [address] [place], [place] [address]  Comprehensive metabolic panel     Status: Abnormal   Collection Time: 02/12/19  5:37 AM  Result Value Ref Range   Sodium 135 135 - 145 mmol/L   Potassium 3.2 (L) 3.5 - 5.1 mmol/L   Chloride 97 (L) 98 - 111 mmol/L   CO2 24 22 - 32 mmol/L   Glucose, Bld 260 (H) 70 - 99 mg/dL   BUN 16 6 - 20 mg/dL   Creatinine, Ser 1.38 (H) 0.44 - 1.00 mg/dL   Calcium 8.9 8.9 - 10.3 mg/dL   Total Protein 7.2 6.5 - 8.1 g/dL   Albumin 3.3 (L) 3.5 - 5.0 g/dL   AST 14 (L) 15 - 41 U/L   ALT 12 0 - 44 U/L   Alkaline Phosphatase 66 38 - 126 U/L   Total Bilirubin 0.9 0.3 - 1.2 mg/dL   GFR calc non Af Amer 46 (L) >60 mL/min   GFR calc Af Amer 53 (L) >60 mL/min   Anion gap 14 5 - 15    Comment: Performed at [facility] [address] [place], [place] [address]  Hemoglobin A1c     Status: Abnormal   Collection Time: 02/12/19  5:37 AM  Result Value Ref Range   Hgb A1c MFr Bld 8.8 (H) 4.8 - 5.6 %    Comment: (NOTE) Pre diabetes:          5.7%-6.4% Diabetes:              >6.4% Glycemic control for   <7.0% adults with diabetes    Mean Plasma Glucose 205.86 mg/dL    Comment: Performed at [facility] [address], [place], [place] [address]  Lipid panel     Status: Abnormal   Collection Time: 02/12/19  5:37 AM  Result Value Ref Range   Cholesterol 170 0 - 200 mg/dL   Triglycerides 385 (H) <150 mg/dL   HDL 35 (L) >40 mg/dL  Total CHOL/HDL Ratio 4.9 RATIO   VLDL 77 (H) 0 - 40 mg/dL   LDL Cholesterol 58 0 - 99 mg/dL    Comment:        Total Cholesterol/HDL:CHD Risk Coronary Heart Disease Risk Table                     Men   Women  1/2 Average Risk   3.4   3.3  Average Risk       5.0   4.4  2 X Average Risk   9.6   7.1  3 X Average Risk  23.4   11.0        Use the calculated Patient Ratio above and the CHD Risk Table to determine the patient's CHD Risk.        ATP III CLASSIFICATION (LDL):  <100     mg/dL   Optimal  100-129  mg/dL   Near or Above                    Optimal  130-159  mg/dL   Borderline  160-189  mg/dL   High  >190     mg/dL   Very High Performed at [facility] [address], [place], [place] [address]   Glucose, capillary     Status: Abnormal   Collection Time: 02/12/19  6:28 AM  Result Value Ref Range   Glucose-Capillary 238 (H) 70 - 99 mg/dL  Magnesium     Status: Abnormal   Collection Time: 02/12/19 10:16 AM  Result Value Ref Range   Magnesium 1.4 (L) 1.7 - 2.4 mg/dL    Comment: Performed at [facility], [place] [address], [place], [place] [address]  Glucose, capillary     Status: Abnormal   Collection Time: 02/12/19 12:09 PM  Result Value Ref Range   Glucose-Capillary 203 (H) 70 - 99 mg/dL   Comment 1 Notify RN    Comment 2 Document in Chart   Glucose, capillary     Status: Abnormal   Collection Time: 02/12/19  4:18 PM  Result Value Ref Range   Glucose-Capillary 238 (H) 70 - 99 mg/dL   Comment 1 Notify RN    Comment 2 Document in Chart   Glucose, capillary     Status: Abnormal   Collection Time: 02/12/19  9:22 PM  Result Value Ref Range   Glucose-Capillary 190 (H) 70 - 99 mg/dL   Comment 1 Notify RN   Glucose, capillary      Status: Abnormal   Collection Time: 02/13/19  6:23 AM  Result Value Ref Range   Glucose-Capillary 198 (H) 70 - 99 mg/dL   Comment 1 Notify RN   Glucose, capillary     Status: Abnormal   Collection Time: 02/13/19 11:27 AM  Result Value Ref Range   Glucose-Capillary 176 (H) 70 - 99 mg/dL   Ct Angio Head W Or Wo Contrast  Result Date: 02/12/2019 CLINICAL DATA:  Right facial numbness. Blurred vision in the right eye. EXAM: CT ANGIOGRAPHY HEAD AND NECK TECHNIQUE: Multidetector CT imaging of the head and neck was performed using the standard protocol during bolus administration of intravenous contrast. Multiplanar CT image reconstructions and MIPs were obtained to evaluate the vascular anatomy. Carotid stenosis measurements (when applicable) are obtained utilizing NASCET criteria, using the distal internal carotid diameter as the denominator. CONTRAST:  21m OMNIPAQUE IOHEXOL 350 MG/ML SOLN COMPARISON:  MRI brain 02/11/2019 FINDINGS: CT HEAD FINDINGS Brain:  Noncontrast images demonstrate no acute intracranial abnormality. The subtle findings along the posterolateral aspect of the left pons are not discernible by CT. Ventricles are of normal size. Persistent cavum septum pellucidum is again noted. No significant extraaxial fluid collection is present. Brainstem and cerebellum are otherwise unremarkable. Vascular: No hyperdense vessel or unexpected calcification. Skull: Calvarium is intact. No focal lytic or blastic lesions are present. Sinuses: The paranasal sinuses and mastoid air cells are clear. Orbits: The globes and orbits are within normal limits. Mild prominence of the lacrimal glands bilaterally is likely within normal limits. Review of the MIP images confirms the above findings CTA NECK FINDINGS Aortic arch: A 3 vessel arch configuration is present. There is no significant stenosis or vascular calcification at the aortic arch. Right carotid system: The right common carotid artery is within normal  limits. The right carotid bifurcation is normal. There is mild tortuosity of the cervical right ICA without a significant stenosis. Left carotid system: The left common carotid artery is within normal limits. The left carotid bifurcation is unremarkable. There is mild tortuosity of the cervical left ICA without a significant stenosis. Vertebral arteries: The vertebral arteries both originate from the subclavian arteries. There is no significant stenosis at the origin of either vertebral artery. The vertebral arteries are codominant. There is moderate tortuosity in the proximal left vertebral artery just after the origin without significant stenosis. There is moderate tortuosity of both vertebral arteries in the neck without significant stenosis. Skeleton: There straightening of the normal cervical lordosis. Vertebral body heights and alignment are maintained. No focal lytic or blastic lesions are present. Other neck: The soft tissues the neck are otherwise within normal limits. Salivary glands are unremarkable. No focal mucosal or submucosal lesions are present. A 6 mm hypodense nodule is noted posteriorly in the left lobe of the thyroid. No follow-up is necessary. No significant adenopathy is present. Upper chest: The lung apices are clear. Review of the MIP images confirms the above findings CTA HEAD FINDINGS Anterior circulation: Atherosclerotic irregularity is present within the cavernous internal carotid arteries bilaterally. There is no significant stenosis relative to the more distal vessel scratched at there is no significant stenosis of greater than 50% relative to the more distant vessel. There is diffuse vessel irregularity in the right M1 segment. The A1 segments are within normal limits. Anterior communicating artery is patent. MCA bifurcations are intact. The distal small vessel segmental irregularity is present in the ACA and MCA branch vessels bilaterally without a significant proximal stenosis or  occlusion. Posterior circulation: The vertebral arteries are codominant. PICA origins are visualized and normal. The vertebrobasilar junction is normal. The basilar artery is within normal limits. Both posterior cerebral arteries originate from the basilar tip. There is mild narrowing at the origin of the left PCA. There is diffuse distal branch vessel irregularity involving PCA branches bilaterally without a significant proximal stenosis or occlusion. Venous sinuses: The dural sinuses are patent. The straight sinus and deep cerebral veins are intact. Cortical veins are unremarkable. Anatomic variants: None Review of the MIP images confirms the above findings IMPRESSION: 1. Diffuse distal branch vessel irregularity throughout the circle-of-Willis without a significant proximal stenosis or occlusion. 2. Atherosclerotic changes are evident within the cavernous internal carotid arteries and right greater than left M1 segments with diffuse vessel irregularity and mild narrowing of less than 50% relative to the more distal vessels. 3. Tortuosity of the cervical internal carotid arteries and vertebral arteries bilaterally without significant stenosis. This is nonspecific, but  often seen in the setting of chronic hypertension. 4. Brainstem infarct and white matter parenchymal changes noted on MRI are not discernible by CT. Electronically Signed   By: San Morelle M.D.   On: 02/12/2019 04:44   Ct Angio Neck W Or Wo Contrast  Result Date: 02/12/2019 CLINICAL DATA:  Right facial numbness. Blurred vision in the right eye. EXAM: CT ANGIOGRAPHY HEAD AND NECK TECHNIQUE: Multidetector CT imaging of the head and neck was performed using the standard protocol during bolus administration of intravenous contrast. Multiplanar CT image reconstructions and MIPs were obtained to evaluate the vascular anatomy. Carotid stenosis measurements (when applicable) are obtained utilizing NASCET criteria, using the distal internal  carotid diameter as the denominator. CONTRAST:  32m OMNIPAQUE IOHEXOL 350 MG/ML SOLN COMPARISON:  MRI brain 02/11/2019 FINDINGS: CT HEAD FINDINGS Brain: Noncontrast images demonstrate no acute intracranial abnormality. The subtle findings along the posterolateral aspect of the left pons are not discernible by CT. Ventricles are of normal size. Persistent cavum septum pellucidum is again noted. No significant extraaxial fluid collection is present. Brainstem and cerebellum are otherwise unremarkable. Vascular: No hyperdense vessel or unexpected calcification. Skull: Calvarium is intact. No focal lytic or blastic lesions are present. Sinuses: The paranasal sinuses and mastoid air cells are clear. Orbits: The globes and orbits are within normal limits. Mild prominence of the lacrimal glands bilaterally is likely within normal limits. Review of the MIP images confirms the above findings CTA NECK FINDINGS Aortic arch: A 3 vessel arch configuration is present. There is no significant stenosis or vascular calcification at the aortic arch. Right carotid system: The right common carotid artery is within normal limits. The right carotid bifurcation is normal. There is mild tortuosity of the cervical right ICA without a significant stenosis. Left carotid system: The left common carotid artery is within normal limits. The left carotid bifurcation is unremarkable. There is mild tortuosity of the cervical left ICA without a significant stenosis. Vertebral arteries: The vertebral arteries both originate from the subclavian arteries. There is no significant stenosis at the origin of either vertebral artery. The vertebral arteries are codominant. There is moderate tortuosity in the proximal left vertebral artery just after the origin without significant stenosis. There is moderate tortuosity of both vertebral arteries in the neck without significant stenosis. Skeleton: There straightening of the normal cervical lordosis. Vertebral  body heights and alignment are maintained. No focal lytic or blastic lesions are present. Other neck: The soft tissues the neck are otherwise within normal limits. Salivary glands are unremarkable. No focal mucosal or submucosal lesions are present. A 6 mm hypodense nodule is noted posteriorly in the left lobe of the thyroid. No follow-up is necessary. No significant adenopathy is present. Upper chest: The lung apices are clear. Review of the MIP images confirms the above findings CTA HEAD FINDINGS Anterior circulation: Atherosclerotic irregularity is present within the cavernous internal carotid arteries bilaterally. There is no significant stenosis relative to the more distal vessel scratched at there is no significant stenosis of greater than 50% relative to the more distant vessel. There is diffuse vessel irregularity in the right M1 segment. The A1 segments are within normal limits. Anterior communicating artery is patent. MCA bifurcations are intact. The distal small vessel segmental irregularity is present in the ACA and MCA branch vessels bilaterally without a significant proximal stenosis or occlusion. Posterior circulation: The vertebral arteries are codominant. PICA origins are visualized and normal. The vertebrobasilar junction is normal. The basilar artery is within normal limits. Both  posterior cerebral arteries originate from the basilar tip. There is mild narrowing at the origin of the left PCA. There is diffuse distal branch vessel irregularity involving PCA branches bilaterally without a significant proximal stenosis or occlusion. Venous sinuses: The dural sinuses are patent. The straight sinus and deep cerebral veins are intact. Cortical veins are unremarkable. Anatomic variants: None Review of the MIP images confirms the above findings IMPRESSION: 1. Diffuse distal branch vessel irregularity throughout the circle-of-Willis without a significant proximal stenosis or occlusion. 2. Atherosclerotic  changes are evident within the cavernous internal carotid arteries and right greater than left M1 segments with diffuse vessel irregularity and mild narrowing of less than 50% relative to the more distal vessels. 3. Tortuosity of the cervical internal carotid arteries and vertebral arteries bilaterally without significant stenosis. This is nonspecific, but often seen in the setting of chronic hypertension. 4. Brainstem infarct and white matter parenchymal changes noted on MRI are not discernible by CT. Electronically Signed   By: San Morelle M.D.   On: 02/12/2019 04:44   Mr Brain Wo Contrast (neuro Protocol)  Result Date: 02/11/2019 CLINICAL DATA:  Initial evaluation for acute right facial numbness. EXAM: MRI HEAD WITHOUT CONTRAST TECHNIQUE: Multiplanar, multiecho pulse sequences of the brain and surrounding structures were obtained without intravenous contrast. COMPARISON:  Previous brain MRI from 12/16/2005. FINDINGS: Brain: Cerebral volume within normal limits for age. Mild scattered subcentimeter T2/FLAIR hyperintensities seen involving the periventricular and deep white matter both cerebral hemispheres, nonspecific, but most like related chronic microvascular ischemic disease. Chronic microvascular ischemic changes present within the pons as well. Patchy small volume restricted diffusion seen involving the left lateral midbrain/pons (series 5, image 73), consistent with acute ischemic infarct. This measures approximately 1 cm in greatest dimension. No associated hemorrhage or mass effect. No other evidence for acute or subacute ischemia gray-white matter differentiation otherwise maintained. No other areas of chronic cortical infarction. No foci of susceptibility artifact to suggest acute or chronic intracranial hemorrhage. No mass lesion, midline shift or mass effect. No hydrocephalus. No extra-axial fluid collection. Pituitary gland suprasellar region within normal limits. Midline structures  intact. Vascular: Major intracranial vascular flow voids are maintained. Skull and upper cervical spine: Craniocervical junction within normal limits. Bone marrow signal intensity diffusely decreased on T1 weighted imaging, most commonly related to anemia, smoking, or obesity. No focal marrow replacing lesion. No scalp soft tissue abnormality. Sinuses/Orbits: Globes and orbital soft tissues within normal limits. Mild scattered mucosal thickening throughout the paranasal sinuses. No air-fluid level to suggest acute sinusitis. No mastoid effusion. Inner ear structures normal. Other: None. IMPRESSION: 1. Patchy small volume acute ischemic nonhemorrhagic infarct involving the left lateral midbrain/pons. 2. Underlying mild chronic microvascular ischemic disease. Electronically Signed   By: Jeannine Boga M.D.   On: 02/11/2019 19:28   Dg Chest Port 1 View  Result Date: 02/11/2019 CLINICAL DATA:  Hypertension. EXAM: PORTABLE CHEST 1 VIEW COMPARISON:  Radiograph 05/08/2006, no more recent exam. FINDINGS: Mild to moderate cardiomegaly. Normal mediastinal contours. Mild central bronchial thickening. No pulmonary edema, focal airspace disease, pleural effusion or pneumothorax. IMPRESSION: Mild to moderate cardiomegaly. Mild central bronchial thickening. Electronically Signed   By: Keith Rake M.D.   On: 02/11/2019 19:50       Medical Problem List and Plan: 1.  Deficits with mobility, balance, endurance, self-care secondary to left pontine lacunar infarct.  Admit to CIR 2.  Antithrombotics: -DVT/anticoagulation:  Pharmaceutical: Lovenox  -antiplatelet therapy: ASA/Plavix.  3. Pain Management: N/A 4. Mood: LCSW to  follow for evaluation and support.   -antipsychotic agents: N/A 5. Neuropsych: This patient is?  Fully capable of making decisions on her own behalf. 6. Skin/Wound Care: Pressure relief measures. Increase fluid intake.  7. Fluids/Electrolytes/Nutrition: Monitor I/Os. 8. Hypertensive  emergency: Monitor BP tid--continue Norvasc..  Monitor with increased mobility. 9. 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  Monitor with increased mobility. 10.  AKI: Encourage po fluid intake.  CMP ordered for tomorrow a.m. 11. OSA: Continue CPAP at bedtime.  12. Hypomagnesemia/ Hypokalemia: Treated today..  Will add oral dose.  CMP ordered for tomorrow a.m.  Post Admission Physician Evaluation: 1. Preadmission assessment reviewed and changes made below. 2. Functional deficits secondary  to left pontine lacunar infarct. 3. Patient is admitted to receive collaborative, interdisciplinary care between the physiatrist, rehab nursing staff, and therapy team. 4. [name] level of medical complexity and substantial therapy needs in context of that medical necessity cannot be provided at a lesser intensity of care such as a SNF. 5. Patient has experienced substantial functional loss from his/her baseline which was documented above under the "Functional History" and "Functional Status" headings.  Judging by the patient's diagnosis, physical exam, and functional history, the patient has potential for functional progress which will result in measurable gains while on inpatient rehab.  These gains will be of substantial and practical use upon discharge  in facilitating mobility and self-care at the household level. 6. Physiatrist will provide 24 hour management of medical needs as well as oversight of the therapy plan/treatment and provide guidance as appropriate regarding the interaction of the two. 7. 24 hour rehab nursing will assist with safety, disease management, medication administration and patient education  and help integrate therapy concepts, techniques,education, etc. 8. PT will assess and treat for/with: Lower extremity strength, range of motion, stamina, balance, functional mobility, safety, adaptive techniques and equipment, wound care, coping skills,  pain control, stroke education. Goals are: Mod I. 9. OT will assess and treat for/with: ADL's, functional mobility, safety, upper extremity strength, adaptive techniques and equipment, wound mgt, ego support, and community reintegration.   Goals are: Mod I. Therapy may proceed with showering this patient. 10. Case Management and Social Worker will assess and treat for psychological issues and discharge planning. 11. Team conference will be held weekly to assess progress toward goals and to determine barriers to discharge. 12. Patient will receive at least 3 hours of therapy per day at least 5 days per week. 13. ELOS: 4-7 days.        14. Prognosis:  excellent and good  I have personally performed a face to face diagnostic evaluation, including, but not limited to relevant history and physical exam findings, of this patient and developed relevant assessment and plan.  Additionally, I have reviewed and concur with the physician assistant's documentation above.  Delice Lesch, MD, ABPMR Bary Leriche, PA-C 02/13/2019

## 2019-02-14 ENCOUNTER — Inpatient Hospital Stay (HOSPITAL_COMMUNITY): Payer: Medicaid Other | Admitting: Speech Pathology

## 2019-02-14 ENCOUNTER — Inpatient Hospital Stay (HOSPITAL_COMMUNITY): Payer: Medicaid Other | Admitting: Occupational Therapy

## 2019-02-14 ENCOUNTER — Inpatient Hospital Stay (HOSPITAL_COMMUNITY): Payer: Medicaid Other | Admitting: Physical Therapy

## 2019-02-14 DIAGNOSIS — I6350 Cerebral infarction due to unspecified occlusion or stenosis of unspecified cerebral artery: Secondary | ICD-10-CM

## 2019-02-14 DIAGNOSIS — R269 Unspecified abnormalities of gait and mobility: Secondary | ICD-10-CM

## 2019-02-14 DIAGNOSIS — G5601 Carpal tunnel syndrome, right upper limb: Secondary | ICD-10-CM

## 2019-02-14 DIAGNOSIS — I69398 Other sequelae of cerebral infarction: Secondary | ICD-10-CM

## 2019-02-14 LAB — CBC WITH DIFFERENTIAL/PLATELET
Abs Immature Granulocytes: 0.04 10*3/uL (ref 0.00–0.07)
Basophils Absolute: 0.1 10*3/uL (ref 0.0–0.1)
Basophils Relative: 1 %
Eosinophils Absolute: 0.3 10*3/uL (ref 0.0–0.5)
Eosinophils Relative: 4 %
HCT: 37.9 % (ref 36.0–46.0)
Hemoglobin: 13.5 g/dL (ref 12.0–15.0)
Immature Granulocytes: 0 %
Lymphocytes Relative: 24 %
Lymphs Abs: 2.2 10*3/uL (ref 0.7–4.0)
MCH: 31.1 pg (ref 26.0–34.0)
MCHC: 35.6 g/dL (ref 30.0–36.0)
MCV: 87.3 fL (ref 80.0–100.0)
Monocytes Absolute: 0.7 10*3/uL (ref 0.1–1.0)
Monocytes Relative: 8 %
Neutro Abs: 5.9 10*3/uL (ref 1.7–7.7)
Neutrophils Relative %: 63 %
Platelets: 267 10*3/uL (ref 150–400)
RBC: 4.34 MIL/uL (ref 3.87–5.11)
RDW: 14 % (ref 11.5–15.5)
WBC: 9.2 10*3/uL (ref 4.0–10.5)
nRBC: 0 % (ref 0.0–0.2)

## 2019-02-14 LAB — COMPREHENSIVE METABOLIC PANEL
ALT: 11 U/L (ref 0–44)
AST: 12 U/L — ABNORMAL LOW (ref 15–41)
Albumin: 3.2 g/dL — ABNORMAL LOW (ref 3.5–5.0)
Alkaline Phosphatase: 66 U/L (ref 38–126)
Anion gap: 11 (ref 5–15)
BUN: 16 mg/dL (ref 6–20)
CO2: 26 mmol/L (ref 22–32)
Calcium: 9 mg/dL (ref 8.9–10.3)
Chloride: 100 mmol/L (ref 98–111)
Creatinine, Ser: 1.34 mg/dL — ABNORMAL HIGH (ref 0.44–1.00)
GFR calc Af Amer: 55 mL/min — ABNORMAL LOW (ref >60)
GFR calc non Af Amer: 47 mL/min — ABNORMAL LOW (ref >60)
Glucose, Bld: 185 mg/dL — ABNORMAL HIGH (ref 70–99)
Potassium: 3.6 mmol/L (ref 3.5–5.1)
Sodium: 137 mmol/L (ref 135–145)
Total Bilirubin: 0.4 mg/dL (ref 0.3–1.2)
Total Protein: 7.2 g/dL (ref 6.5–8.1)

## 2019-02-14 LAB — GLUCOSE, CAPILLARY
Glucose-Capillary: 192 mg/dL — ABNORMAL HIGH (ref 70–99)
Glucose-Capillary: 139 mg/dL — ABNORMAL HIGH (ref 70–99)
Glucose-Capillary: 174 mg/dL — ABNORMAL HIGH (ref 70–99)
Glucose-Capillary: 137 mg/dL — ABNORMAL HIGH (ref 70–99)

## 2019-02-14 LAB — MAGNESIUM: Magnesium: 1.8 mg/dL (ref 1.7–2.4)

## 2019-02-14 MED ORDER — INSULIN GLARGINE 100 UNIT/ML ~~LOC~~ SOLN
12.0000 [IU] | Freq: Two times a day (BID) | SUBCUTANEOUS | Status: DC
  Administered 2019-02-14 – 2019-02-19 (×10): 12 [IU] via SUBCUTANEOUS
  Filled 2019-02-14 (×11): qty 0.12

## 2019-02-14 MED ORDER — POTASSIUM CHLORIDE CRYS ER 10 MEQ PO TBCR
10.0000 meq | EXTENDED_RELEASE_TABLET | Freq: Two times a day (BID) | ORAL | Status: DC
  Administered 2019-02-14 – 2019-02-20 (×13): 10 meq via ORAL
  Filled 2019-02-14 (×13): qty 1

## 2019-02-14 NOTE — Progress Notes (Signed)
Orthopedic Tech Progress Note Patient Details:  ALESSA MAZUR Nov 28, 1970 [id number]  Ortho Devices Type of Ortho Device: Velcro wrist splint Ortho Device/Splint Location: URE Ortho Device/Splint Interventions: Adjustment, Application   Post Interventions Patient Tolerated: Well Instructions Provided: Care of device, Adjustment of device   Janit Pagan 02/14/2019, 9:18 AM

## 2019-02-14 NOTE — Evaluation (Signed)
Physical Therapy Assessment and Plan  Patient Details  Name: [name] MRN: [id number] Date of Birth: 1971-03-06  PT Diagnosis: Abnormality of gait and Difficulty walking Rehab Potential: Good ELOS: 5-7 days   Today's Date: 02/14/2019 PT Individual Time: 0900-1000 PT Individual Time Calculation (min): 60 min    Problem List:  Patient Active Problem List   Diagnosis Date Noted  . 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[place]) 02/13/2019  . Cerebrovascular accident (CVA) ([place])   . Hypokalemia   . Hypomagnesemia   . AKI (acute kidney injury) ([place])   . Acute CVA (cerebrovascular accident) ([place]) 02/11/2019  . Hypertensive urgency 02/11/2019  . ARF (acute renal failure) ([place]) 02/11/2019  . Type 2 diabetes mellitus with vascular disease ([place]) 02/11/2019  . Diabetes mellitus, type 2 ([place]) 04/12/2018  . Hypertension associated with diabetes ([place]) 01/08/2018  . Obstructive sleep apnea 08/26/2017    Past Medical History:  Past Medical History:  Diagnosis Date  . Arthritis   . Diabetes mellitus   . Hypertension   . Left thyroid nodule    diagnosed 07/2018  . Pseudotumor cerebri    Past Surgical History:  Past Surgical History:  Procedure Laterality Date  . ACHILLES TENDON REPAIR      Assessment & Plan Clinical Impression: [name] is a 48 year old female with history of HTN, T2DM with neuropathy,  OSA- non compliant with CPAP, pseudotumor cerebri;  who was admitted to ED on 02/11/19 with persistent right facial numbness X 48 hours and malignant HTN with BP 220/116.  History taken from chart review and patient.  Patient noted to be out of her medications for more than 2 months due to [place]. MRI brain done revealing patchy small volume nonhemorrhagic infarct in left lateral midbrain/pons and small vessel disease.  CTA head/neck showed athrosclerotic changes with diffuse irregularity of distal branch throughout circle of Willis.  Echocardiogram with ejection fraction of 60-65% with severe  concentric LVH. Dr. Leonie Man felt that stroke was due to small vessel disease and recommended ASA/Plavix X 3 weeks followed by ASA alone as well as compliance with CPAP use and cessation of THC use. She was also noted to be  dehydrated with BUN/SCr 22/1.58 as well as hypokalemic with low Mg levels- 1.4. Treated with IVF with improvement in renal status. Patient with resultant balance deficits and RLE proprioceptive deficits. CIR recommended due to functional decline.  Please see preadmission assessment from earlier today as well. Patient transferred to CIR on 02/13/2019 .   Patient currently requires min with mobility secondary to muscle weakness and decreased standing balance, decreased postural control and decreased balance strategies.  Prior to hospitalization, patient was independent  with mobility and lived with Family(children 69, 61, 62) in a Apartment home.  Home access is  Level entry.  Patient will benefit from skilled PT intervention to maximize safe functional mobility, minimize fall risk and decrease caregiver burden for planned discharge home with intermittent assist.  Anticipate patient will benefit from follow up [facility] at discharge.  PT - End of Session Activity Tolerance: Tolerates 30+ min activity without fatigue Endurance Deficit: No PT Assessment Rehab Potential (ACUTE/IP ONLY): Good PT Barriers to Discharge: Decreased caregiver support PT Patient demonstrates impairments in the following area(s): Balance;Safety;Sensory PT Transfers Functional Problem(s): Bed Mobility;Bed to Chair;Car;Furniture;Floor PT Locomotion Functional Problem(s): Ambulation;Wheelchair Mobility;Stairs PT Plan PT Intensity: Minimum of 1-2 x/day ,45 to 90 minutes PT Frequency: 5 out of 7 days PT Duration Estimated Length of Stay: 5-7 days PT Treatment/Interventions: Ambulation/gait  training;Balance/vestibular training;Community reintegration;Discharge planning;Disease management/prevention;DME/adaptive equipment  instruction;Functional mobility training;Neuromuscular re-education;Patient/family education;Psychosocial support;Therapeutic Activities;Therapeutic Exercise;UE/LE Strength taining/ROM;UE/LE Coordination activities PT Transfers Anticipated Outcome(s): mod I PT Locomotion Anticipated Outcome(s): mod I with LRAD PT Recommendation Recommendations for Other Services: Neuropsych consult;Therapeutic Recreation consult Therapeutic Recreation Interventions: Stress management Follow Up Recommendations: Home health PT Patient destination: Home Equipment Recommended: To be determined Equipment Details: TBD pending progress  Skilled Therapeutic Intervention Evaluation completed (see details above and below) with education on PT POC and goals and individual treatment initiated with focus on functional transfer and gait assessment. Pt received seated in bed, agreeable to PT eval. Bed mobility Supervision. Stand pivot transfer bed to w/c with min HHA, unsteadiness in standing. Pt is setup A to brush teeth and wash her face while seated in w/c at sink. Manual w/c propulsion x 150 ft with use of BUE and Supervision. Ambulation with min HHA x 150 ft, lateral lean to the L, decreased RLE clearance with gait. Trial gait with SPC and occasional min HHA, pt continues to exhibit above gait deviations. Will continue to assess best AD use for pt safety with gait training. Seated BP 189/102, pt has had ongoing high BP since admit. Pt left seated in w/c in room with needs in reach at end of session.  PT Evaluation Precautions/Restrictions Precautions Precautions: Fall;Other (comment) Precaution Comments: moniter HR and BP Restrictions Weight Bearing Restrictions: No Pain Pain Assessment Pain Scale: 0-10 Pain Score: 0-No pain Home Living/Prior Functioning Home Living Available Help at Discharge: Family;Available PRN/intermittently Type of Home: Apartment Home Access: Level entry Home Layout: One level Bathroom  Shower/Tub: Tub/shower unit;Curtain Bathroom Toilet: Handicapped height Bathroom Accessibility: Yes Additional Comments: lives with her 3 children (11,12, 20)  Lives With: Family(children 18, 12, 11) Prior Function Level of Independence: Independent with gait;Independent with transfers;Independent with basic ADLs  Able to Take Stairs?: Yes Driving: Yes Vocation: Unemployed(worked for [employer] prior to COVID) Comments: home with family and alone at times, lives with 3 children (18, 48, 29) Vision/Perception  Vision - History Baseline Vision: Wears glasses all the time Vision - Assessment Eye Alignment: Within Functional Limits Ocular Range of Motion: Within Functional Limits Alignment/Gaze Preference: Within Defined Limits Tracking/Visual Pursuits: Able to track stimulus in all quads without difficulty Perception Perception: Within Functional Limits Praxis Praxis: Intact  Cognition Overall Cognitive Status: Within Functional Limits for tasks assessed Arousal/Alertness: Awake/alert Orientation Level: Oriented X4 Attention: Selective Focused Attention: Appears intact Sustained Attention: Appears intact Selective Attention: Appears intact Memory: Appears intact Awareness: Appears intact Problem Solving: Appears intact Behaviors: Restless;Impulsive Safety/Judgment: Impaired Comments: attempts to stand from unlocked wheelchair by herself multiple times Sensation Sensation Light Touch: Impaired Detail(peripheral neuropathy in BLE) Peripheral sensation comments: peripheral neuropathy at baseline; distal > proximal Light Touch Impaired Details: Impaired LLE;Impaired RLE Hot/Cold: Appears Intact Proprioception: Appears Intact Proprioception Impaired Details: Impaired RLE;Impaired LLE Coordination Gross Motor Movements are Fluid and Coordinated: No Fine Motor Movements are Fluid and Coordinated: Yes Coordination and Movement Description: impaired 2/2 balance impairments Finger  Nose Finger Test: WNL 9 Hole Peg Test: Lt: 27 seconds, Rt: 28 seconds Motor  Motor Motor: Within Functional Limits Motor - Skilled Clinical Observations: generalized weakness  Mobility Bed Mobility Bed Mobility: Rolling Right;Rolling Left;Supine to Sit;Sit to Supine Rolling Right: Supervision/verbal cueing Rolling Left: Supervision/Verbal cueing Supine to Sit: Supervision/Verbal cueing Sit to Supine: Supervision/Verbal cueing Transfers Transfers: Sit to Stand;Stand Pivot Transfers Sit to Stand: Minimal Assistance - Patient > 75% Stand Pivot Transfers: Minimal Assistance - Patient > 75%  Stand Pivot Transfer Details: Verbal cues for sequencing;Verbal cues for technique;Verbal cues for precautions/safety Locomotion  Gait Gait Distance (Feet): 150 Feet Assistive device: 1 person hand held assist;Straight cane Gait Gait Pattern: Impaired(lat trunk lean to L, dec RLE clearance) Gait velocity: decreased Stairs / Additional Locomotion Stairs: No Wheelchair Mobility Wheelchair Mobility: No  Trunk/Postural Assessment  Cervical Assessment Cervical Assessment: Exceptions to WFL(forward head) Thoracic Assessment Thoracic Assessment: Exceptions to WFL(rounded shoulders) Lumbar Assessment Lumbar Assessment: Within Functional Limits Postural Control Postural Control: Deficits on evaluation Righting Reactions: delayed Protective Responses: impaired Postural Limitations: impaired  Balance Balance Balance Assessed: Yes Static Sitting Balance Static Sitting - Balance Support: No upper extremity supported;Feet supported Static Sitting - Level of Assistance: 5: Stand by assistance Dynamic Sitting Balance Dynamic Sitting - Balance Support: No upper extremity supported;Feet supported;During functional activity Dynamic Sitting - Level of Assistance: 5: Stand by assistance Static Standing Balance Static Standing - Balance Support: No upper extremity supported;During functional  activity Static Standing - Level of Assistance: 4: Min assist Dynamic Standing Balance Dynamic Standing - Balance Support: Right upper extremity supported;During functional activity Dynamic Standing - Level of Assistance: 4: Min assist Extremity Assessment  RUE Assessment RUE Assessment: Within Functional Limits Passive Range of Motion (PROM) Comments: WNL Active Range of Motion (AROM) Comments: WNL General Strength Comments: grossly 4/5 at shoulder, 5/5 distal LUE Assessment LUE Assessment: Within Functional Limits General Strength Comments: 5/5 RLE Assessment RLE Assessment: Within Functional Limits General Strength Comments: 4+/5 grossly LLE Assessment LLE Assessment: Within Functional Limits General Strength Comments: 4+/5 grossly    Refer to Care Plan for Long Term Goals  Recommendations for other services: Neuropsych and Therapeutic Recreation  Stress management  Discharge Criteria: Patient will be discharged from PT if patient refuses treatment 3 consecutive times without medical reason, if treatment goals not met, if there is a change in medical status, if patient makes no progress towards goals or if patient is discharged from hospital.  The above assessment, treatment plan, treatment alternatives and goals were discussed and mutually agreed upon: by patient   Excell Seltzer, PT, DPT 02/14/2019, 12:23 PM

## 2019-02-14 NOTE — IPOC Note (Signed)
Overall Plan of Care [facility]) Patient Details Name: [name] MRN: [id number] DOB: Jul 30, 1971  Admitting Diagnosis: <principal problem not specified>  Hospital Problems: Active Problems:   Left pontine stroke [facility])     Functional Problem List: Nursing Bowel, Bladder, Endurance, Motor  PT Balance, Safety, Sensory  OT Balance, Cognition, Endurance, Motor, Pain, Safety  SLP    TR         Basic ADL's: OT Grooming, Bathing, Dressing, Toileting     Advanced  ADL's: OT Simple Meal Preparation, Laundry, Light Housekeeping     Transfers: PT Bed Mobility, Bed to Chair, Car, [employer], Floor  OT Toilet, Metallurgist: PT Ambulation, Emergency planning/management officer, Stairs     Additional Impairments: OT None  SLP        TR      Anticipated Outcomes Item Anticipated Outcome  Self Feeding    Swallowing      Basic self-care  Mod I  Toileting  Mod I   Bathroom Transfers Mod I  Bowel/Bladder  mod I, regular pattern.  Transfers  mod I  Locomotion  mod I with LRAD  Communication     Cognition     Pain  pain less than 3 on scale 0-10  Safety/Judgment  remain free of injury. prevent falls   Therapy Plan: PT Intensity: Minimum of 1-2 x/day ,45 to 90 minutes PT Frequency: 5 out of 7 days PT Duration Estimated Length of Stay: 5-7 days OT Intensity: Minimum of 1-2 x/day, 45 to 90 minutes OT Frequency: 5 out of 7 days OT Duration/Estimated Length of Stay: 5-7 days     Due to the current state of emergency, patients may not be receiving their 3-hours of Medicare-mandated therapy.   Team Interventions: Nursing Interventions Patient/Family Education, Disease Management/Prevention, Skin Care/Wound Management, Bladder Management, Pain Management, Bowel Management, Medication Management, Psychosocial Support  PT interventions Ambulation/gait training, Training and development officer, Community reintegration, Discharge planning, Disease management/prevention,  DME/adaptive equipment instruction, Functional mobility training, Neuromuscular re-education, Patient/family education, Psychosocial support, Therapeutic Activities, Therapeutic Exercise, UE/LE Strength taining/ROM, UE/LE Coordination activities  OT Interventions Balance/vestibular training, Cognitive remediation/compensation, Community reintegration, Discharge planning, Disease mangement/prevention, DME/adaptive equipment instruction, Functional mobility training, Neuromuscular re-education, Pain management, Patient/family education, Psychosocial support, Self Care/advanced ADL retraining, Splinting/orthotics, Therapeutic Activities, Therapeutic Exercise, UE/LE Strength taining/ROM, UE/LE Coordination activities  SLP Interventions    TR Interventions    SW/CM Interventions Discharge Planning, Psychosocial Support, Patient/Family Education   Barriers to Discharge MD  Medical stability  Nursing      PT Decreased caregiver support    OT      SLP      SW       Team Discharge Planning: Destination: PT-Home ,OT- Home , SLP-  Projected Follow-up: PT-Home health PT, OT-  Outpatient OT, SLP-  Projected Equipment Needs: PT-To be determined, OT- Tub/shower bench, To be determined, SLP-  Equipment Details: PT-TBD pending progress, OT-  Patient/family involved in discharge planning: PT- Patient,  OT-Patient, SLP-   MD ELOS: 5-7d Medical Rehab Prognosis:  Excellent Assessment:  48 year old female with history of HTN, T2DM with neuropathy,  OSA- non compliant with CPAP, pseudotumor cerebri;  who was admitted to ED on 02/11/19 with persistent right facial numbness X 48 hours and malignant HTN with BP 220/116.  History taken from chart review and patient.  Patient noted to be out of her medications for more than 2 months due to [place]. MRI brain done revealing patchy small volume nonhemorrhagic infarct  in left lateral midbrain/pons and small vessel disease.  CTA head/neck showed athrosclerotic changes  with diffuse irregularity of distal branch throughout circle of Willis.  Echocardiogram with ejection fraction of 60-65% with severe concentric LVH. Dr. Leonie Man felt that stroke was due to small vessel disease and recommended ASA/Plavix X 3 weeks followed by ASA alone as well as compliance with CPAP use and cessation of THC use. She was also noted to be  dehydrated with BUN/SCr 22/1.58 as well as hypokalemic with low Mg levels- 1.4. Treated with IVF with improvement in renal status   Now requiring 24/7 Rehab RN,MD, as well as CIR level PT, OT and SLP.  Treatment team will focus on ADLs and mobility with goals set at Mod I See Team Conference Notes for weekly updates to the plan of care

## 2019-02-14 NOTE — Plan of Care (Signed)
  Problem: RH BOWEL ELIMINATION Goal: RH STG MANAGE BOWEL WITH ASSISTANCE Description: STG Manage Bowel with Mod I.  Outcome: Not Progressing; LBM 7/8

## 2019-02-14 NOTE — Progress Notes (Addendum)
[facility] PHYSICAL MEDICINE & REHABILITATION PROGRESS NOTE   Subjective/Complaints: Tingling in RIght hand after "sleeping on it"  Feels like it is in fingertips only , no neck pain, remainder of arm feels normal   + constipation , LBM ~2 d ago, no abd pain  ROS - Denies CP, SOB, N/V/D Objective:   No results found. Recent Labs    02/12/19 0537 02/14/19 0551  WBC 10.3 9.2  HGB 13.2 13.5  HCT 37.3 37.9  PLT 267 267   Recent Labs    02/12/19 0537 02/14/19 0551  NA 135 137  K 3.2* 3.6  CL 97* 100  CO2 24 26  GLUCOSE 260* 185*  BUN 16 16  CREATININE 1.38* 1.34*  CALCIUM 8.9 9.0    Intake/Output Summary (Last 24 hours) at 02/14/2019 0759 Last data filed at 02/13/2019 2113 Gross per 24 hour  Intake 240 ml  Output -  Net 240 ml     Physical Exam: Vital Signs Blood pressure (!) 188/90, pulse 84, temperature 98.3 F (36.8 C), resp. rate 18, height 5\' 9"  (1.753 m), weight 100.4 kg, SpO2 100 %.  General: No acute distress Mood and affect are appropriate Heart: Regular rate and rhythm no rubs murmurs or extra sounds Lungs: Clear to auscultation, breathing unlabored, no rales or wheezes Abdomen: Positive bowel sounds, soft nontender to palpation, nondistended Extremities: No clubbing, cyanosis, or edema Skin: No evidence of breakdown, no evidence of rash Neurologic: Cranial nerves II through XII intact, motor strength is 4+ /5 in bilateral deltoid, bicep, tricep, grip, hip flexor, knee extensors, ankle dorsiflexor and plantar flexor Sensory exam normal sensationparastheisa to LT, Right hand Reduced sensation in both feet as well  Cerebellar exam normal finger to nose to finger as well as heel to shin in bilateral upper and lower extremities No dysdiadocho kinesis  Musculoskeletal: Full range of motion in all 4 extremities. No joint swelling    Assessment/Plan: 1. Functional deficits secondary to Left pontine lacunar infarct  which require 3+ hours per day of  interdisciplinary therapy in a comprehensive inpatient rehab setting.  Physiatrist is providing close team supervision and 24 hour management of active medical problems listed below.  Physiatrist and rehab team continue to assess barriers to discharge/monitor patient progress toward functional and medical goals  Care Tool:  Bathing              Bathing assist       Upper Body Dressing/Undressing Upper body dressing   What is the patient wearing?: Pull over shirt    Upper body assist Assist Level: Independent    Lower Body Dressing/Undressing Lower body dressing      What is the patient wearing?: Underwear/pull up     Lower body assist Assist for lower body dressing: Contact Guard/Touching assist     Toileting Toileting    Toileting assist       Transfers Chair/bed transfer  Transfers assist           Locomotion Ambulation   Ambulation assist              Walk 10 feet activity   Assist           Walk 50 feet activity   Assist           Walk 150 feet activity   Assist           Walk 10 feet on uneven surface  activity   Assist  Wheelchair     Assist               Wheelchair 50 feet with 2 turns activity    Assist            Wheelchair 150 feet activity     Assist          Medical Problem List and Plan: 1.  Deficits with mobility, balance, endurance, self-care secondary to left pontine lacunar infarct.             CIR PT, OT, evals today  2.  Antithrombotics: -DVT/anticoagulation:  Pharmaceutical: Lovenox             -antiplatelet therapy: ASA/Plavix.  3. Pain Management: N/A 4. Mood: LCSW to follow for evaluation and support.              -antipsychotic agents: N/A 5. Neuropsych: This patient is?  Fully capable of making decisions on her own behalf. 6. Skin/Wound Care: Pressure relief measures. Increase fluid intake.  7. Fluids/Electrolytes/Nutrition: Monitor  I/Os. 8. Hypertensive emergency: Monitor BP tid--continue Norvasc..  Monitor with increased mobility. 9. 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  Monitor with increased mobility. CBG (last 3)  Recent Labs    02/13/19 1723 02/13/19 2155 02/14/19 0626  GLUCAP 201* 199* 192*  running higher than goal of 180 max Increase Lantus to 12U BID 10.  AKI: Encourage po fluid intake.  CMP ordered for tomorrow a.m. 11. OSA: Continue CPAP at bedtime.  12. Hypomagnesemia/ Hypokalemia: Treated today.Marland Kitchen add KCL supplement 72meq BID    Mg low nl cont current supplement 400mg  BID 13.  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

## 2019-02-14 NOTE — Evaluation (Signed)
Occupational Therapy Assessment and Plan  Patient Details  Name: [name] MRN: [id number] Date of Birth: [birth year]  OT Diagnosis: muscle weakness (generalized) Rehab Potential: Rehab Potential (ACUTE ONLY): Good ELOS: 5-7 days   Today's Date: 02/14/2019 OT Individual Time: 1100-1200 OT Individual Time Calculation (min): 60 min     Problem List:  Patient Active Problem List   Diagnosis Date Noted  . 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[place]) 02/13/2019  . Cerebrovascular accident (CVA) ([place])   . Hypokalemia   . Hypomagnesemia   . AKI (acute kidney injury) ([place])   . Acute CVA (cerebrovascular accident) ([place]) 02/11/2019  . Hypertensive urgency 02/11/2019  . ARF (acute renal failure) ([place]) 02/11/2019  . Type 2 diabetes mellitus with vascular disease ([place]) 02/11/2019  . Diabetes mellitus, type 2 ([place]) 04/12/2018  . Hypertension associated with diabetes ([place]) 01/08/2018  . Obstructive sleep apnea 08/26/2017    Past Medical History:  Past Medical History:  Diagnosis Date  . Arthritis   . Diabetes mellitus   . Hypertension   . Left thyroid nodule    diagnosed 07/2018  . Pseudotumor cerebri    Past Surgical History:  Past Surgical History:  Procedure Laterality Date  . ACHILLES TENDON REPAIR      Assessment & Plan Clinical Impression: Patient is a 48 y.o. year old female with history of HTN, T2DM with neuropathy,  OSA- non compliant with CPAP, pseudotumor cerebri;  who was admitted to ED on 02/11/19 with persistent right facial numbness X 48 hours and malignant HTN with BP 220/116.  History taken from chart review and patient.  Patient noted to be out of her medications for more than 2 months due to [place]. MRI brain done revealing patchy small volume nonhemorrhagic infarct in left lateral midbrain/pons and small vessel disease.  CTA head/neck showed athrosclerotic changes with diffuse irregularity of distal branch throughout circle of Willis.  Echocardiogram with ejection fraction of  60-65% with severe concentric LVH. Dr. Leonie Man felt that stroke was due to small vessel disease and recommended ASA/Plavix X 3 weeks followed by ASA alone as well as compliance with CPAP use and cessation of THC use. She was also noted to be  dehydrated with BUN/SCr 22/1.58 as well as hypokalemic with low Mg levels- 1.4. Treated with IVF with improvement in renal status. Patient with resultant balance deficits and RLE proprioceptive deficits. CIR recommended due to functional decline.  Patient transferred to CIR on 02/13/2019 .    Patient currently requires min with basic self-care skills secondary to muscle weakness, decreased awareness, decreased problem solving and decreased safety awareness and decreased standing balance and decreased balance strategies.  Prior to hospitalization, patient could complete ADLs and IADLs with independent .  Patient will benefit from skilled intervention to increase independence with basic self-care skills and increase level of independence with iADL prior to discharge home with her children.  Anticipate patient will require intermittent supervision and follow up outpatient.  OT - End of Session Activity Tolerance: Tolerates 30+ min activity without fatigue Endurance Deficit: No OT Assessment Rehab Potential (ACUTE ONLY): Good OT Patient demonstrates impairments in the following area(s): Balance;Cognition;Endurance;Motor;Pain;Safety OT Basic ADL's Functional Problem(s): Grooming;Bathing;Dressing;Toileting OT Advanced ADL's Functional Problem(s): Simple Meal Preparation;Laundry;Light Housekeeping OT Transfers Functional Problem(s): Toilet;Tub/Shower OT Additional Impairment(s): None OT Plan OT Intensity: Minimum of 1-2 x/day, 45 to 90 minutes OT Frequency: 5 out of 7 days OT Duration/Estimated Length of Stay: 5-7 days OT Treatment/Interventions: Balance/vestibular training;Cognitive remediation/compensation;Community reintegration;Discharge planning;Disease  mangement/prevention;DME/adaptive equipment instruction;Functional mobility training;Neuromuscular re-education;Pain  management;Patient/family education;Psychosocial support;Self Care/advanced ADL retraining;Splinting/orthotics;Therapeutic Activities;Therapeutic Exercise;UE/LE Strength taining/ROM;UE/LE Coordination activities OT Basic Self-Care Anticipated Outcome(s): Mod I OT Toileting Anticipated Outcome(s): Mod I OT Bathroom Transfers Anticipated Outcome(s): Mod I OT Recommendation Patient destination: Home Follow Up Recommendations: Outpatient OT Equipment Recommended: Tub/shower bench;To be determined   Skilled Therapeutic Intervention OT eval completed with discussion of rehab process, OT purpose, POC, ELOS, and goals.  ADL assessment completed with bathing/dressing at sit > stand level in shower.  Pt required min assist/HHA with functional mobility and transfers.  CGA/Min assist for dynamic standing during toileting hygiene, bathing, and dressing.  Pt attempted dressing in standing requiring CGA when donning shirt and min assist when donning underwear.  Educated on sitting for LB dressing with pt able to don pants with CGA at sit > stand level.  Pt very motivated to increase independence as she reports that she has a lot to do at home because "I am mom".    OT Evaluation Precautions/Restrictions  Precautions Precautions: Fall;Other (comment) Precaution Comments: moniter HR and BP Restrictions Weight Bearing Restrictions: No Vital Signs Therapy Vitals Pulse Rate: 89 BP: (!) 189/102 Patient Position (if appropriate): Sitting Pain Pain Assessment Pain Scale: 0-10 Pain Score: 0-No pain Home Living/Prior Functioning Home Living Available Help at Discharge: Family, Available PRN/intermittently Type of Home: Apartment Home Access: Level entry Home Layout: One level Bathroom Shower/Tub: Tub/shower unit, Architectural technologist: Handicapped height Bathroom Accessibility:  Yes Additional Comments: lives with her 3 children (11,12, 27)  Lives With: Family(children 31, 12, 11) IADL History Homemaking Responsibilities: Yes Meal Prep Responsibility: Primary Laundry Responsibility: Primary Cleaning Responsibility: Primary Bill Paying/Finance Responsibility: Primary Shopping Responsibility: Primary Child Care Responsibility: Primary Current License: Yes Education: Some college Occupation: Unemployed(had been working for [employer] until [employer]) Prior Function Level of Independence: Independent with gait, Independent with transfers, Independent with basic ADLs  Able to Take Stairs?: Yes Driving: Yes Vocation: Unemployed(worked for [employer] prior to COVID) Comments: home with family and alone at times, lives with 3 children (19, 81, 68) ADL ADL Grooming: Supervision/safety, Contact guard Where Assessed-Grooming: Standing at sink Upper Body Bathing: Supervision/safety Where Assessed-Upper Body Bathing: Shower Lower Body Bathing: Minimal assistance Where Assessed-Lower Body Bathing: Shower Upper Body Dressing: Contact guard Where Assessed-Upper Body Dressing: (in standing) Lower Body Dressing: Minimal assistance Where Assessed-Lower Body Dressing: (in standing) Toileting: Contact guard Where Assessed-Toileting: Glass blower/designer: Psychiatric nurse Method: Counselling psychologist: Emergency planning/management officer Transfer: Environmental education officer Method: Heritage manager: Civil engineer, contracting with back, Grab bars Vision Baseline Vision/History: Wears glasses Wears Glasses: At all times Eye Alignment: Within Functional Limits Ocular Range of Motion: Within Functional Limits Alignment/Gaze Preference: Within Defined Limits Tracking/Visual Pursuits: Able to track stimulus in all quads without difficulty Perception  Perception: Within Functional Limits Praxis Praxis: Intact Cognition Overall  Cognitive Status: Within Functional Limits for tasks assessed Arousal/Alertness: Awake/alert Orientation Level: Person Year: 2020 Month: July Day of Week: Correct Memory: Appears intact Immediate Memory Recall: Sock;Blue;Bed Memory Recall Sock: Without Cue Memory Recall Blue: Without Cue Memory Recall Bed: Without Cue Attention: Selective Focused Attention: Appears intact Sustained Attention: Appears intact Selective Attention: Appears intact Awareness: Appears intact Problem Solving: Appears intact Behaviors: Restless;Impulsive Safety/Judgment: Impaired Comments: attempts to stand from unlocked wheelchair by herself multiple times Sensation Sensation Light Touch: Impaired Detail(peripheral neuropathy in BLE) Peripheral sensation comments: (P) peripheral neuropathy at baseline; distal > proximal Light Touch Impaired Details: Impaired LLE;Impaired RLE Hot/Cold: Appears Intact Proprioception: Appears Intact Proprioception  Impaired Details: (P) Impaired RLE;Impaired LLE Coordination Gross Motor Movements are Fluid and Coordinated: (P) No Fine Motor Movements are Fluid and Coordinated: Yes Coordination and Movement Description: (P) impaired 2/2 balance impairments Finger Nose Finger Test: WNL 9 Hole Peg Test: Lt: 27 seconds, Rt: 28 seconds Extremity/Trunk Assessment RUE Assessment RUE Assessment: Within Functional Limits Passive Range of Motion (PROM) Comments: WNL Active Range of Motion (AROM) Comments: WNL General Strength Comments: grossly 4/5 at shoulder, 5/5 distal LUE Assessment LUE Assessment: Within Functional Limits General Strength Comments: 5/5     Refer to Care Plan for Long Term Goals  Recommendations for other services: None    Discharge Criteria: Patient will be discharged from OT if patient refuses treatment 3 consecutive times without medical reason, if treatment goals not met, if there is a change in medical status, if patient makes no progress towards  goals or if patient is discharged from hospital.  The above assessment, treatment plan, treatment alternatives and goals were discussed and mutually agreed upon: by patient  Simonne Come 02/14/2019, 12:22 PM

## 2019-02-14 NOTE — Evaluation (Signed)
Speech Language Pathology Assessment and Plan  Patient Details  Name: [name] MRN: [id number] Date of Birth: [birth year]  SLP Diagnosis: Cognitive Impairments;Speech and Language deficits  Rehab Potential: Excellent ELOS: 5-7 days    Today's Date: 02/14/2019 SLP Individual Time: 0973-5329 SLP Individual Time Calculation (min): 55 min   Problem List:  Patient Active Problem List   Diagnosis Date Noted  . 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[place]) 02/13/2019  . Cerebrovascular accident (CVA) ([place])   . Hypokalemia   . Hypomagnesemia   . AKI (acute kidney injury) ([place])   . Acute CVA (cerebrovascular accident) ([place]) 02/11/2019  . Hypertensive urgency 02/11/2019  . ARF (acute renal failure) ([place]) 02/11/2019  . Type 2 diabetes mellitus with vascular disease ([place]) 02/11/2019  . Diabetes mellitus, type 2 ([place]) 04/12/2018  . Hypertension associated with diabetes ([place]) 01/08/2018  . Obstructive sleep apnea 08/26/2017   Past Medical History:  Past Medical History:  Diagnosis Date  . Arthritis   . Diabetes mellitus   . Hypertension   . Left thyroid nodule    diagnosed 07/2018  . Pseudotumor cerebri    Past Surgical History:  Past Surgical History:  Procedure Laterality Date  . ACHILLES TENDON REPAIR      Assessment / Plan / Recommendation Clinical Impression Patient is a 48 year old female with history of HTN, T2DM with neuropathy,  OSA- non compliant with CPAP, pseudotumor cerebri;  who was admitted to ED on 02/11/19 with persistent right facial numbness X 48 hours and malignant HTN with BP 220/116.  History taken from chart review and patient.  Patient noted to be out of her medications for more than 2 months due to [place]. MRI brain done revealing patchy small volume nonhemorrhagic infarct in left lateral midbrain/pons and small vessel disease.  CTA head/neck showed athrosclerotic changes with diffuse irregularity of distal branch throughout circle of Willis.  Echocardiogram with ejection  fraction of 60-65% with severe concentric LVH. Dr. Leonie Man felt that stroke was due to small vessel disease and recommended ASA/Plavix X 3 weeks followed by ASA alone as well as compliance with CPAP use and cessation of THC use. She was also noted to be  dehydrated with BUN/SCr 22/1.58 as well as hypokalemic with low Mg levels- 1.4. Treated with IVF with improvement in renal status. Patient with resultant balance deficits and RLE proprioceptive deficits. CIR recommended due to functional decline and patient admitted 02/13/19.  Patient administered the COGNISTAT and demonstrates mild deficits in short-term recall which is further exacerbated by impaired selective attention which impacts her safety with functional and familiar tasks. Mild deficits in word-finding were also noted at the conversation level. Patient would benefit from skilled SLP intervention to maximize her cognitive-linguistic functioning prior to discharge.    Skilled Therapeutic Interventions          Administered a cognitive-linguistic evaluation, please see above for details. Educated patient in regards to current cognitive-linguistic impairments and goals of skilled SLP intervention, she verbalized understanding and agreement.   SLP Assessment  Patient will need skilled [facility] [facility] Services during CIR admission    Recommendations  Oral Care Recommendations: Oral care BID Recommendations for Other Services: Neuropsych consult Patient destination: Home Follow up Recommendations: Home Health SLP;24 hour supervision/assistance Equipment Recommended: To be determined    SLP Frequency 3 to 5 out of 7 days   SLP Duration  SLP Intensity  SLP Treatment/Interventions 5-7 days  Minumum of 1-2 x/day, 30 to 90 minutes  Cognitive remediation/compensation;Speech/Language facilitation;Internal/external aids;Cueing hierarchy;Environmental  controls;Functional tasks;Patient/family education    Pain Pain Assessment Pain Scale:  0-10 Pain Score: 0-No pain  Prior Functioning Type of Home: Apartment  Lives With: Family(children 18, 12, 11) Available Help at Discharge: Family;Available PRN/intermittently Education: Some college Vocation: Unemployed(worked for [employer] prior to COVID)  Short Term Goals: Week 1: SLP Short Term Goal 1 (Week 1): STGs=LTGs due to short length of stay  Refer to Care Plan for Long Term Goals  Recommendations for other services: Neuropsych  Discharge Criteria: Patient will be discharged from SLP if patient refuses treatment 3 consecutive times without medical reason, if treatment goals not met, if there is a change in medical status, if patient makes no progress towards goals or if patient is discharged from hospital.  The above assessment, treatment plan, treatment alternatives and goals were discussed and mutually agreed upon: by patient  Floyed Masoud 02/14/2019, 3:20 PM

## 2019-02-14 NOTE — Plan of Care (Signed)
Nutrition Education Note RD working remotely.  RD consulted for nutrition education regarding a Heart Healthy diet. Unable to reach patient in room. Provided patient with Heart Healthy Consistent Carb Handout from Academy of [employer] in the discharge summary.  Lipid Panel     Component Value Date/Time   CHOL 170 02/12/2019 0537   TRIG 385 (H) 02/12/2019 0537   HDL 35 (L) 02/12/2019 0537   CHOLHDL 4.9 02/12/2019 0537   VLDL 77 (H) 02/12/2019 0537   LDLCALC 58 02/12/2019 0537    RD provided "Heart Healthy Consistent Carb Nutrition Therapy" handout from the Academy of Nutrition and Dietetics. Reviewed patient's dietary recall. Provided examples on ways to decrease sodium and fat intake in diet. Discouraged intake of processed foods and use of salt shaker. Encouraged fresh fruits and vegetables as well as whole grain sources of carbohydrates to maximize fiber intake.   Expect fair compliance.  Body mass index is 32.69 kg/m. Pt meets criteria for obese based on current BMI.  Current diet order is CHO mod, no documented meals at this time. Labs and medications reviewed. No further nutrition interventions warranted at this time. RD contact information provided. If additional nutrition issues arise, please re-consult RD.  Lajuan Lines, RD, LDN  After Hours/Weekend Pager: [phone]

## 2019-02-15 ENCOUNTER — Inpatient Hospital Stay (HOSPITAL_COMMUNITY): Payer: Medicaid Other | Admitting: Speech Pathology

## 2019-02-15 ENCOUNTER — Inpatient Hospital Stay (HOSPITAL_COMMUNITY): Payer: Medicaid Other | Admitting: Occupational Therapy

## 2019-02-15 ENCOUNTER — Inpatient Hospital Stay (HOSPITAL_COMMUNITY): Payer: Medicaid Other | Admitting: Physical Therapy

## 2019-02-15 LAB — GLUCOSE, CAPILLARY
Glucose-Capillary: 138 mg/dL — ABNORMAL HIGH (ref 70–99)
Glucose-Capillary: 118 mg/dL — ABNORMAL HIGH (ref 70–99)
Glucose-Capillary: 127 mg/dL — ABNORMAL HIGH (ref 70–99)
Glucose-Capillary: 215 mg/dL — ABNORMAL HIGH (ref 70–99)

## 2019-02-15 NOTE — Progress Notes (Signed)
Speech Language Pathology Daily Session Note  Patient Details  Name: [name] MRN: [id number] Date of Birth: 03-Dec-1970  Today's Date: 02/15/2019 SLP Individual Time: 0830-0925 SLP Individual Time Calculation (min): 55 min  Short Term Goals: Week 1: SLP Short Term Goal 1 (Week 1): STGs=LTGs due to short length of stay  Skilled Therapeutic Interventions: Skilled treatment session focused on cognitive-linguistic goals. SLP facilitated session by providing extra time and Min A verbal cues for thought organization while unscrambling complex sentences. Min A verbal cues were also needed for recall of sentence during transcription. Patient left upright in bed with all needs within reach. Continue with current plan of care.      Pain No/Denies Pain   Therapy/Group: Individual Therapy  Symphani Eckstrom 02/15/2019, 2:53 PM

## 2019-02-15 NOTE — Progress Notes (Signed)
Pt placed self on CPAP dream station. Pt set in auto titrate with max 20 min 6 with no O2 bled into the system. Pt current respiratory status is stable at this time. RT will continue to monitor.

## 2019-02-15 NOTE — Progress Notes (Signed)
[place] PHYSICAL MEDICINE & REHABILITATION PROGRESS NOTE   Subjective/Complaints: No issues overnite   ROS - Denies CP, SOB, N/V/D Objective:   No results found. Recent Labs    02/14/19 0551  WBC 9.2  HGB 13.5  HCT 37.9  PLT 267   Recent Labs    02/14/19 0551  NA 137  K 3.6  CL 100  CO2 26  GLUCOSE 185*  BUN 16  CREATININE 1.34*  CALCIUM 9.0    Intake/Output Summary (Last 24 hours) at 02/15/2019 0700 Last data filed at 02/14/2019 1700 Gross per 24 hour  Intake 380 ml  Output -  Net 380 ml     Physical Exam: Vital Signs Blood pressure (!) 177/101, pulse 76, temperature 98.6 F (37 C), resp. rate 18, height 5\' 9"  (1.753 m), weight 100.4 kg, SpO2 100 %.  General: No acute distress Mood and affect are appropriate Heart: Regular rate and rhythm no rubs murmurs or extra sounds Lungs: Clear to auscultation, breathing unlabored, no rales or wheezes Abdomen: Positive bowel sounds, soft nontender to palpation, nondistended Extremities: No clubbing, cyanosis, or edema Skin: No evidence of breakdown, no evidence of rash Neurologic: Cranial nerves II through XII intact, motor strength is 4+ /5 in bilateral deltoid, bicep, tricep, grip, hip flexor, knee extensors, ankle dorsiflexor and plantar flexor Sensory exam normal sensationparastheisa to LT, Right hand Reduced sensation in both feet as well  Cerebellar exam normal finger to nose to finger as well as heel to shin in bilateral upper and lower extremities No dysdiadocho kinesis  Musculoskeletal: Full range of motion in all 4 extremities. No joint swelling    Assessment/Plan: 1. Functional deficits secondary to Left pontine lacunar infarct  which require 3+ hours per day of interdisciplinary therapy in a comprehensive inpatient rehab setting.  Physiatrist is providing close team supervision and 24 hour management of active medical problems listed below.  Physiatrist and rehab team continue to assess barriers to  discharge/monitor patient progress toward functional and medical goals  Care Tool:  Bathing    Body parts bathed by patient: Right arm, Left arm, Chest, Abdomen, Front perineal area, Buttocks, Right upper leg, Left upper leg, Right lower leg, Left lower leg, Face         Bathing assist Assist Level: Minimal Assistance - Patient > 75%     Upper Body Dressing/Undressing Upper body dressing   What is the patient wearing?: Pull over shirt    Upper body assist Assist Level: Contact Guard/Touching assist(in standing)    Lower Body Dressing/Undressing Lower body dressing      What is the patient wearing?: Underwear/pull up     Lower body assist Assist for lower body dressing: Minimal Assistance - Patient > 75%     Toileting Toileting    Toileting assist Assist for toileting: Contact Guard/Touching assist     Transfers Chair/bed transfer  Transfers assist     Chair/bed transfer assist level: Minimal Assistance - Patient > 75%     Locomotion Ambulation   Ambulation assist      Assist level: Minimal Assistance - Patient > 75% Assistive device: Cane-straight Max distance: 150'   Walk 10 feet activity   Assist     Assist level: Minimal Assistance - Patient > 75% Assistive device: Cane-straight   Walk 50 feet activity   Assist    Assist level: Minimal Assistance - Patient > 75% Assistive device: Cane-straight    Walk 150 feet activity   Assist    Assist  level: Minimal Assistance - Patient > 75% Assistive device: Cane-straight    Walk 10 feet on uneven surface  activity   Assist Walk 10 feet on uneven surfaces activity did not occur: Safety/medical concerns         Wheelchair     Assist Will patient use wheelchair at discharge?: No             Wheelchair 50 feet with 2 turns activity    Assist            Wheelchair 150 feet activity     Assist          Medical Problem List and Plan: 1.  Deficits with  mobility, balance, endurance, self-care secondary to left pontine lacunar infarct.             CIR PT, OT, no d/c date set as of yet  2.  Antithrombotics: -DVT/anticoagulation:  Pharmaceutical: Lovenox             -antiplatelet therapy: ASA/Plavix.  3. Pain Management: N/A 4. Mood: LCSW to follow for evaluation and support.              -antipsychotic agents: N/A 5. Neuropsych: This patient is?  Fully capable of making decisions on her own behalf. 6. Skin/Wound Care: Pressure relief measures. Increase fluid intake.  7. Fluids/Electrolytes/Nutrition: Monitor I/Os. 8. Hypertensive emergency: Monitor BP tid--continue Norvasc..  Monitor with increased mobility. 9. 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  Monitor with increased mobility. CBG (last 3)  Recent Labs    02/14/19 1636 02/14/19 2125 02/15/19 0631  GLUCAP 174* 137* 138*   Increase Lantus to 12U BID, Improved  7/10 10.  AKI: Encourage po fluid intake.  CMP ordered for tomorrow a.m. 11. OSA: Continue CPAP at bedtime.  12. Hypomagnesemia/ Hypokalemia: Treated today.Marland Kitchen add KCL supplement 72meq BID  , recheck BMET on Monday  Mg low nl cont current supplement 400mg  BID 13.  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

## 2019-02-15 NOTE — Progress Notes (Signed)
Social Work Assessment and Plan   Patient Details  Name: [name] MRN: [id number] Date of Birth: April 26, 1971  Today's Date: 02/15/2019  Problem List:  Patient Active Problem List   Diagnosis Date Noted  . Labile blood glucose   . Diabetes mellitus type 2 in obese ([place])   . Benign essential HTN   . Hypertensive crisis   . 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[place]) 02/13/2019  . Cerebrovascular accident (CVA) ([place])   . Hypokalemia   . Hypomagnesemia   . AKI (acute kidney injury) ([place])   . Acute CVA (cerebrovascular accident) ([place]) 02/11/2019  . Hypertensive urgency 02/11/2019  . ARF (acute renal failure) ([place]) 02/11/2019  . Type 2 diabetes mellitus with vascular disease ([place]) 02/11/2019  . Diabetes mellitus, type 2 ([place]) 04/12/2018  . Hypertension associated with diabetes ([place]) 01/08/2018  . Obstructive sleep apnea 08/26/2017   Past Medical History:  Past Medical History:  Diagnosis Date  . Arthritis   . Diabetes mellitus   . Hypertension   . Left thyroid nodule    diagnosed 07/2018  . Pseudotumor cerebri    Past Surgical History:  Past Surgical History:  Procedure Laterality Date  . ACHILLES TENDON REPAIR     Social History:  reports that she has been smoking cigarettes. She has been smoking about 0.50 packs per day. She quit smokeless tobacco use about 2 years ago. She reports current alcohol use. She reports current drug use. Drug: Marijuana.  Family / Support Systems Marital Status: Single Patient Roles: Parent Children: Pt has two daughters, ages 80 adn 78 yrs and an 43 yo son all living in the home.  Also, a 67 yr old daughter living in [place]. Anticipated Caregiver: adult child 15 Ability/Limitations of Caregiver: none Caregiver Availability: Intermittent Family Dynamics: Pt feels her children will provide any support she needs.  Also, has a local cousin who could assist as well prn.  Social History Preferred language: English Religion: Non-Denominational Cultural  Background: NA Read: Yes Write: Yes Employment Status: Unemployed Public relations account executive Issues: none Guardian/Conservator: None - per MD, pt is "?fully capable" of making decisions on her own behalf - include eldest daughter as needed.   Abuse/Neglect Abuse/Neglect Assessment Can Be Completed: Yes Physical Abuse: Denies Verbal Abuse: Denies Sexual Abuse: Denies Exploitation of patient/patient's resources: Denies Self-Neglect: Denies  Emotional Status Pt's affect, behavior and adjustment status: Pt very pleasant, talkative and shows a very good sense of humor.  She admits that she was "not taking care of myself" and cites her fear of COVID for preventing her from going to MD offices and filling meds.  Pt denies any significant emotional distress - will monitor. Recent Psychosocial Issues: None Psychiatric History: None Substance Abuse History: None  Patient / Family Perceptions, Expectations & Goals Pt/Family understanding of illness & functional limitations: Pt with good, general understanding of her stroke and deficits. Premorbid pt/family roles/activities: Completely independent and caring for her family. Anticipated changes in roles/activities/participation: Little to no change in roles with mod independent goals. Pt/family expectations/goals: "I just want to be as close to normal for my family as I can."  Recruitment consultant: None Premorbid Home Care/DME Agencies: None Transportation available at discharge: yes Resource referrals recommended: Neuropsychology  Discharge Planning Living Arrangements: Children Support Systems: Children, Other relatives, Friends/neighbors Type of Residence: Private residence Insurance Resources: [employer] (specify county) Museum/gallery curator Resources: Family Support Financial Screen Referred: No Living Expenses: Education officer, community Management: Patient Does the patient have any problems obtaining your medications?: No  Home Management:  pt and family Patient/Family Preliminary Plans: Pt to return home with her children and the eldest in the home to be primary support as needed. Social Work Anticipated Follow Up Needs: HH/OP Expected length of stay: ELOS 5 to 7 days  Clinical Impression Very pleasant, talkative woman here following a stroke and making good progress with short LOS anticipated.  Has teen-aged children in the home who can assist if needed.  Local cousin and friends available as well.  No significant emotional distress noted.  Will follow for support and d/c planning needs.  Elisandro Jarrett 02/15/2019, 4:51 PM

## 2019-02-15 NOTE — Progress Notes (Addendum)
Physical Therapy Session Note  Patient Details  Name: [name] MRN: [id number] Date of Birth: 19-May-1971  Today's Date: 02/15/2019 PT Individual Time: 1300-1415 PT Individual Time Calculation (min): 75 min   Short Term Goals: Week 1:  PT Short Term Goal 1 (Week 1): =LTG due to ELOS  Skilled Therapeutic Interventions/Progress Updates:    Pt received seated in w/c in room, agreeable to PT session. No complaints of pain. Manual w/c propulsion x 150 ft with use of BUE and Supervision. Sit to stand with min A throughout therapy session. Ambulation 2 x 150 ft with use of SPC and min A for balance. Pt exhibits decreased lateral lean to the L with gait this date with improved control and attention to balance. Pt does continue to exhibit decreased RLE clearance. Ambulation with RW x 150 ft with CGA, improved balance and control with use of RW. Will continue to assess best AD needs for patient. Berg Balance Test, 21/56, high fall risk. Patient demonstrates increased fall risk as noted by score of  21/56 on Berg Balance Scale.  (<36= high risk for falls, close to 100%; 37-45 significant >80%; 46-51 moderate >50%; 52-55 lower >25%)  Reviewed score of balance test with patient and implications for her fall risk and AD use. Side-steps L/R with HHA x 10 feet each direction, v/c to increase BLE clearance. Side-steps L/R with HHA in agility ladder x 2 reps each direction, improved clearance of BLE. Pt exhibits some anxiety during therapy session with regards to wanting to perform well so she can d/c home. Education with patient that she is being challenged during therapy sessions and that what she is doing is supposed to be hard and to not feel nervous about not being able to d/c just because therapy tasks are challenging for her. Reviewed deep breathing techniques for anxiety management with patient. Pt left seated in w/c in room with needs in reach, chair alarm in place at end of session.  Therapy  Documentation Precautions:  Precautions Precautions: Fall, Other (comment) Precaution Comments: moniter HR and BP Restrictions Weight Bearing Restrictions: No   Balance Balance Assessed: Yes Standardized Balance Assessment Standardized Balance Assessment: Berg Balance Test Berg Balance Test Sit to Stand: Able to stand using hands after several tries Standing Unsupported: Able to stand 30 seconds unsupported Sitting with Back Unsupported but Feet Supported on Floor or Stool: Able to sit safely and securely 2 minutes Stand to Sit: Controls descent by using hands Transfers: Needs one person to assist Standing Unsupported with Eyes Closed: Able to stand 3 seconds Standing Ubsupported with Feet Together: Able to place feet together independently and stand for 1 minute with supervision From Standing, Reach Forward with Outstretched Arm: Loses balance while trying/requires external support From Standing Position, Pick up Object from Floor: Unable to try/needs assist to keep balance From Standing Position, Turn to Look Behind Over each Shoulder: Needs supervision when turning Turn 360 Degrees: Needs close supervision or verbal cueing Standing Unsupported, Alternately Place Feet on Step/Stool: Able to complete >2 steps/needs minimal assist Standing Unsupported, One Foot in Front: Needs help to step but can hold 15 seconds Standing on One Leg: Unable to try or needs assist to prevent fall Total Score: 21    Therapy/Group: Individual Therapy   Excell Seltzer, PT, DPT  02/15/2019, 2:45 PM

## 2019-02-15 NOTE — Plan of Care (Signed)
  Problem: Consults Goal: RH STROKE PATIENT EDUCATION Description: See Patient Education module for education specifics  Outcome: Progressing Goal: Nutrition Consult-if indicated Description: Meal intake at least 60% Outcome: Progressing Goal: Diabetes Guidelines if Diabetic/Glucose > 140 Description: If diabetic or lab glucose is > 140 mg/dl - Initiate Diabetes/Hyperglycemia Guidelines & Document Interventions  Outcome: Progressing   Problem: RH BOWEL ELIMINATION Goal: RH STG MANAGE BOWEL WITH ASSISTANCE Description: STG Manage Bowel with Mod I.  Outcome: Progressing Goal: RH STG MANAGE BOWEL W/MEDICATION W/ASSISTANCE Description: STG Manage Bowel with Medication with Mod I  Outcome: Progressing   Problem: RH BLADDER ELIMINATION Goal: RH STG MANAGE BLADDER WITH ASSISTANCE Description: STG Manage Bladder With Mod I Outcome: Progressing Goal: RH STG MANAGE BLADDER WITH EQUIPMENT WITH ASSISTANCE Description: STG Manage Bladder With Equipment With mod I Outcome: Progressing   Problem: RH SKIN INTEGRITY Goal: RH STG SKIN FREE OF INFECTION/BREAKDOWN Description: Assess skin every shift. Report abnormal findings Outcome: Progressing Goal: RH STG MAINTAIN SKIN INTEGRITY WITH ASSISTANCE Description: STG Maintain Skin Integrity With Mod I Outcome: Progressing   Problem: RH SAFETY Goal: RH STG ADHERE TO SAFETY PRECAUTIONS W/ASSISTANCE/DEVICE Description: STG Adhere to Safety Precautions With Mod I.  Outcome: Progressing Goal: RH STG DECREASED RISK OF FALL WITH ASSISTANCE Description: STG Decreased Risk of Fall With Mod I Outcome: Progressing   Problem: RH COGNITION-NURSING Goal: RH STG USES MEMORY AIDS/STRATEGIES W/ASSIST TO PROBLEM SOLVE Description: STG Uses Memory Aids/Strategies With mod I Assistance to Problem Solve. Outcome: Progressing Goal: RH STG ANTICIPATES NEEDS/CALLS FOR ASSIST W/ASSIST/CUES Description: STG Anticipates Needs/Calls for Assist With Mod I  Assistance/Cues. Outcome: Progressing   Problem: RH PAIN MANAGEMENT Goal: RH STG PAIN MANAGED AT OR BELOW PT'S PAIN GOAL Description: Pain less than 3 on scale of 0-10.  Outcome: Progressing   Problem: RH KNOWLEDGE DEFICIT Goal: RH STG INCREASE KNOWLEDGE OF DIABETES Description: Pt will be able to verbalize at least 2 medications used to prevent stroke.  Outcome: Progressing Goal: RH STG INCREASE KNOWLEDGE OF HYPERTENSION Description: Pt will be able to state at least two medications pt is taking to control blood pressure.  Outcome: Progressing Goal: RH STG INCREASE KNOWLEGDE OF HYPERLIPIDEMIA Description: Pt will be able to verbalize medication used to treat hyperlipidemia.  Outcome: Progressing Goal: RH STG INCREASE KNOWLEDGE OF STROKE PROPHYLAXIS Description: Pt will be able to verbalize at least 2 measures used to prevent stroke.  Outcome: Progressing

## 2019-02-15 NOTE — Progress Notes (Signed)
Occupational Therapy Session Note  Patient Details  Name: [name] MRN: [id number] Date of Birth: [birth year]  Today's Date: 02/15/2019 OT Individual Time: 5188-4166 OT Individual Time Calculation (min): 60 min   Skilled Therapeutic Interventions/Progress Updates:  Patient participated as follows:  Bed to in room shower transfer via cane=close S Bathing on tub transfer bench= close S for leaning side to side to wash periarea and buttocks while holding onto grab bar.    Patient elected washing this way instead of standing this session. Dressing= supervison and extra time due to fatigue and reaching feet    Patient left in w/c with call bell and phone within reach at the end of the session  Continue OT Plan of care Therapy Documentation Precautions:  Precautions Precautions: Fall, Other (comment) Precaution Comments: moniter HR and BP Restrictions Weight Bearing Restrictions: No  Pain:denied  Therapy/Group: Individual Therapy  Alfredia Ferguson [facility] 02/15/2019, 3:47 PM

## 2019-02-15 NOTE — Progress Notes (Signed)
Pt placed on CPAP dream station on auto titrate with max 20 min 6 with no O2 bled into the system. Pt respiratory status stable at this time on CPAP. RT will continue to monitor.

## 2019-02-15 NOTE — Progress Notes (Deleted)
Social Work Patient ID: [name], female   DOB: 07-24-1971, 48 y.o.   MRN: [id number]   Attempted to complete assessment interview, however, pt declined at this time and asked that I come back on Monday.  Dane Bloch, LCSW

## 2019-02-16 ENCOUNTER — Inpatient Hospital Stay (HOSPITAL_COMMUNITY): Payer: Medicaid Other | Admitting: Physical Therapy

## 2019-02-16 ENCOUNTER — Inpatient Hospital Stay (HOSPITAL_COMMUNITY): Payer: Medicaid Other | Admitting: Occupational Therapy

## 2019-02-16 ENCOUNTER — Inpatient Hospital Stay (HOSPITAL_COMMUNITY): Payer: Medicaid Other | Admitting: Speech Pathology

## 2019-02-16 DIAGNOSIS — E1169 Type 2 diabetes mellitus with other specified complication: Secondary | ICD-10-CM

## 2019-02-16 DIAGNOSIS — E669 Obesity, unspecified: Secondary | ICD-10-CM

## 2019-02-16 DIAGNOSIS — I169 Hypertensive crisis, unspecified: Secondary | ICD-10-CM

## 2019-02-16 DIAGNOSIS — N179 Acute kidney failure, unspecified: Secondary | ICD-10-CM

## 2019-02-16 DIAGNOSIS — I1A0 Resistant hypertension: Secondary | ICD-10-CM

## 2019-02-16 DIAGNOSIS — E119 Type 2 diabetes mellitus without complications: Secondary | ICD-10-CM

## 2019-02-16 DIAGNOSIS — I10 Essential (primary) hypertension: Secondary | ICD-10-CM

## 2019-02-16 DIAGNOSIS — E876 Hypokalemia: Secondary | ICD-10-CM

## 2019-02-16 LAB — GLUCOSE, CAPILLARY
Glucose-Capillary: 139 mg/dL — ABNORMAL HIGH (ref 70–99)
Glucose-Capillary: 59 mg/dL — ABNORMAL LOW (ref 70–99)
Glucose-Capillary: 176 mg/dL — ABNORMAL HIGH (ref 70–99)
Glucose-Capillary: 186 mg/dL — ABNORMAL HIGH (ref 70–99)
Glucose-Capillary: 227 mg/dL — ABNORMAL HIGH (ref 70–99)

## 2019-02-16 MED ORDER — HYDRALAZINE HCL 10 MG PO TABS
10.0000 mg | ORAL_TABLET | Freq: Three times a day (TID) | ORAL | Status: DC
  Administered 2019-02-16 – 2019-02-17 (×3): 10 mg via ORAL
  Filled 2019-02-16 (×3): qty 1

## 2019-02-16 MED ORDER — POLYETHYLENE GLYCOL 3350 17 G PO PACK: 17.0000 g | PACK | Freq: Every day | ORAL | Status: DC

## 2019-02-16 MED ORDER — POLYETHYLENE GLYCOL 3350 17 G PO PACK
17.0000 g | PACK | Freq: Two times a day (BID) | ORAL | Status: DC
  Filled 2019-02-16 (×5): qty 1

## 2019-02-16 NOTE — Progress Notes (Signed)
Physical Therapy Session Note  Patient Details  Name: [name] MRN: [id number] Date of Birth: [birth year]  Today's Date: 02/16/2019 PT Individual Time: 0800-0900 PT Individual Time Calculation (min): 60 min   Short Term Goals: Week 1:  PT Short Term Goal 1 (Week 1): =LTG due to ELOS  Skilled Therapeutic Interventions/Progress Updates:    Pt received seated in bed, agreeable to PT session. No complaints of pain. Bed mobility independent. Sit to stand with Supervision to RW. Ambulation x 200 ft with RW and CGA, R toe drag and catches with gait. Trial gait x 100 ft with RW with R PLF AFO and CGA. Pt demos improved ability to clear RLE and improved heel strike with use of AFO. Ambulation x 200 ft with SPC and CGA for balance, improved balance noted this date but pt does continue to reach out with RUE for furniture to steady herself with gait. Ambulation through obstacle coursing stepping onto/off of foam fad/cone taps/object step overs with RW and CGA for balance. Ascend/descend 4 6" stairs with 2 handrails and CGA with v/c for step-to gait pattern. Standing mini-squats x 10 reps with use of RW and CGA for balance for LE NMR. Pt left seated in bed with needs in reach at end of session.  Therapy Documentation Precautions:  Precautions Precautions: Fall, Other (comment) Precaution Comments: moniter HR and BP Restrictions Weight Bearing Restrictions: No    Therapy/Group: Individual Therapy   Excell Seltzer, PT, DPT  02/16/2019, 12:44 PM

## 2019-02-16 NOTE — Progress Notes (Signed)
[facility] PHYSICAL MEDICINE & REHABILITATION PROGRESS NOTE   Subjective/Complaints: Patient seen sitting up in bed this morning.  She states he slept fairly well overnight.  She had some nausea, but that resolved.  She states she had a bowel movement yesterday.  ROS -denies CP, SOB, N/V/D Objective:   No results found. Recent Labs    02/14/19 0551  WBC 9.2  HGB 13.5  HCT 37.9  PLT 267   Recent Labs    02/14/19 0551  NA 137  K 3.6  CL 100  CO2 26  GLUCOSE 185*  BUN 16  CREATININE 1.34*  CALCIUM 9.0    Intake/Output Summary (Last 24 hours) at 02/16/2019 0944 Last data filed at 02/16/2019 0700 Gross per 24 hour  Intake 360 ml  Output -  Net 360 ml     Physical Exam: Vital Signs Blood pressure (!) 175/99, pulse 81, temperature 98.1 F (36.7 C), resp. rate 16, height 5\' 9"  (1.753 m), weight 100.4 kg, SpO2 97 %. Constitutional: No distress . Vital signs reviewed. HENT: Normocephalic.  Atraumatic. Eyes: EOMI. No discharge. Cardiovascular: No JVD. Respiratory: Normal effort. GI: Non-distended. Musc: No edema or tenderness in extremities. Skin: No evidence of breakdown, no evidence of rash Neurologic:  Alert and oriented Motor: Grossly 4+/5 throughout Psych: Normal mood.  Normal behavior.  Assessment/Plan: 1. Functional deficits secondary to Left pontine lacunar infarct  which require 3+ hours per day of interdisciplinary therapy in a comprehensive inpatient rehab setting.  Physiatrist is providing close team supervision and 24 hour management of active medical problems listed below.  Physiatrist and rehab team continue to assess barriers to discharge/monitor patient progress toward functional and medical goals  Care Tool:  Bathing    Body parts bathed by patient: Right arm, Left lower leg, Face, Left arm, Chest, Abdomen, Front perineal area, Buttocks, Right upper leg, Left upper leg, Right lower leg         Bathing assist Assist Level: Supervision/Verbal  cueing     Upper Body Dressing/Undressing Upper body dressing   What is the patient wearing?: Pull over shirt    Upper body assist Assist Level: Set up assist    Lower Body Dressing/Undressing Lower body dressing      What is the patient wearing?: Pants     Lower body assist Assist for lower body dressing: Supervision/Verbal cueing     Toileting Toileting    Toileting assist Assist for toileting: Supervision/Verbal cueing     Transfers Chair/bed transfer  Transfers assist     Chair/bed transfer assist level: Minimal Assistance - Patient > 75%     Locomotion Ambulation   Ambulation assist      Assist level: Minimal Assistance - Patient > 75% Assistive device: Walker-rolling Max distance: 150'   Walk 10 feet activity   Assist     Assist level: Minimal Assistance - Patient > 75% Assistive device: Walker-rolling   Walk 50 feet activity   Assist    Assist level: Minimal Assistance - Patient > 75% Assistive device: Walker-rolling    Walk 150 feet activity   Assist    Assist level: Minimal Assistance - Patient > 75% Assistive device: Walker-rolling    Walk 10 feet on uneven surface  activity   Assist Walk 10 feet on uneven surfaces activity did not occur: Safety/medical concerns         Wheelchair     Assist Will patient use wheelchair at discharge?: No  Wheelchair 50 feet with 2 turns activity    Assist            Wheelchair 150 feet activity     Assist          Medical Problem List and Plan: 1.  Deficits with mobility, balance, endurance, self-care secondary to left pontine lacunar infarct on 02/11/2019.             Continue CIR  Notes reviewed- progressing from stroke, labs personally reviewed 2.  Antithrombotics: -DVT/anticoagulation:  Pharmaceutical: Lovenox             -antiplatelet therapy: ASA/Plavix.  3. Pain Management: N/A 4. Mood: LCSW to follow for evaluation and support.               -antipsychotic agents: N/A 5. Neuropsych: This patient is capable of making decisions on her own behalf. 6. Skin/Wound Care: Pressure relief measures. Increase fluid intake.  7. Fluids/Electrolytes/Nutrition: Monitor I/Os. 8.  Essential hypertension monitor BP   Continue Norvasc.  Hydralazine 10 3 times daily started on 7/11  Hypertensive crisis overnight  Monitor with increased mobility. 9. 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  Monitor with increased mobility. CBG (last 3)  Recent Labs    02/15/19 1637 02/15/19 2115 02/16/19 0638  GLUCAP 127* 215* 139*   Increased Lantus to 12U BID  Labile on 7/11 10.  AKI vs CKD:   Creatinine 1.34 on 7/9  Encourage fluids 11. OSA: Continue CPAP at bedtime.  12. Hypomagnesemia/ Hypokalemia:   KCL supplement 33meq BID  Labs ordered for Monday   Mg low nl cont current supplement 400mg  BID 13.  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

## 2019-02-16 NOTE — Progress Notes (Signed)
Hypoglycemic Event  CBG: 53  Treatment: meal; crackers; coke  Symptoms: per pt she feels nervousness; clammy  Follow-up CBG: Time:1301 CBG Result:176  Possible Reasons for Event: per pt she did not eat much breakfast due to nausea; meds  Comments/MD notified:protocol followed    [name]

## 2019-02-16 NOTE — Progress Notes (Signed)
Occupational Therapy Session Note  Patient Details  Name: [name] MRN: [id number] Date of Birth: 1971/06/15  Today's Date: 02/16/2019 OT Individual Time: 1001-1100 OT Individual Time Calculation (min): 59 min   Short Term Goals: Week 1:  OT Short Term Goal 1 (Week 1): STG = LTGs due to ELOS  Skilled Therapeutic Interventions/Progress Updates:    Pt greeted EOB, gathering her ADL items in prep for OT. No c/o pain, ready to shower. She completed bathing (sit<stand from TTB), dressing (sit<stand from elevated toilet), and bedmaking (ambulating without AD) during session. All functional transfers completed with Min A without device use. Vcs required for not relying on furniture for balance assist. Pt utilized seated figure 4 to wash feet. Close supervision for standing balance during perihygiene. Close supervision for standing balance during LB dressing as well. Pt able to don her Rt AFO while seated EOB. Worked on higher level balance during IADL participation for remainder of session. Pt stripped and made up bed with clean linen. Steady assist-close supervision for balance when bending outside of base of support and stooping to tuck in blankets. Practiced transfers in and out of bed when bed was elevated to height at home. Able to complete transfers without bedrail use, and scooting up/down bed. She reports sleeping near headboard PTA (has king size bed). But after scooting, pt reports she plans to sleep in a position near central of bed to increase ease of OOB transferring (I.e. to toilet). Pt was left sitting in w/c with all needs and chair alarm set at end of session. Tx focus placed on functional ambulation, dynamic balance, d/c planning, and ADL/IADL retraining.      Therapy Documentation Precautions:  Precautions Precautions: Fall, Other (comment) Precaution Comments: moniter HR and BP Restrictions Weight Bearing Restrictions: No Pain: Pain Assessment Pain Scale: 0-10 Pain Score:  0-No pain ADL: ADL Grooming: Supervision/safety, Contact guard Where Assessed-Grooming: Standing at sink Upper Body Bathing: Supervision/safety Where Assessed-Upper Body Bathing: Shower Lower Body Bathing: Minimal assistance Where Assessed-Lower Body Bathing: Shower Upper Body Dressing: Contact guard Where Assessed-Upper Body Dressing: (in standing) Lower Body Dressing: Minimal assistance Where Assessed-Lower Body Dressing: (in standing) Toileting: Contact guard Where Assessed-Toileting: Glass blower/designer: Psychiatric nurse Method: Counselling psychologist: Emergency planning/management officer Transfer: Environmental education officer Method: Heritage manager: Shower seat with back, Grab bars     Therapy/Group: Individual Therapy  [name] 02/16/2019, 5:24 PM

## 2019-02-16 NOTE — Progress Notes (Signed)
Speech Language Pathology Daily Session Note  Patient Details  Name: KATHREEN DILEO MRN: [id number] Date of Birth: Apr 13, 1971  Today's Date: 02/16/2019 SLP Individual Time: 1300-1400 SLP Individual Time Calculation (min): 60 min  Short Term Goals: Week 1: SLP Short Term Goal 1 (Week 1): STGs=LTGs due to short length of stay  Skilled Therapeutic Interventions:  Pt was seen for skilled ST targeting communication goals.   Pt endorses ongoing higher level word finding difficulties that become frustrating at times when she feels she can't clearly express what she wants to people.  SLP provided skilled education regarding word finding strategies to maximize functional independence and minimize frustration with communication.  Pt verbalized understanding and was able to implement strategies during a verbal description task with mod I.  Pt was left in chair with call bell within reach.  Continue per current plan of care.    Pain Pain Assessment Pain Scale: 0-10 Pain Score: 0-No pain  Therapy/Group: Individual Therapy  Yvonda Fouty, Selinda Orion 02/16/2019, 2:21 PM

## 2019-02-17 ENCOUNTER — Inpatient Hospital Stay (HOSPITAL_COMMUNITY): Payer: Medicaid Other | Admitting: Physical Therapy

## 2019-02-17 DIAGNOSIS — R7309 Other abnormal glucose: Secondary | ICD-10-CM

## 2019-02-17 LAB — GLUCOSE, CAPILLARY
Glucose-Capillary: 150 mg/dL — ABNORMAL HIGH (ref 70–99)
Glucose-Capillary: 122 mg/dL — ABNORMAL HIGH (ref 70–99)
Glucose-Capillary: 181 mg/dL — ABNORMAL HIGH (ref 70–99)
Glucose-Capillary: 181 mg/dL — ABNORMAL HIGH (ref 70–99)

## 2019-02-17 MED ORDER — HYDRALAZINE HCL 50 MG PO TABS
50.0000 mg | ORAL_TABLET | Freq: Three times a day (TID) | ORAL | Status: DC
  Administered 2019-02-17 – 2019-02-20 (×9): 50 mg via ORAL
  Filled 2019-02-17 (×9): qty 1

## 2019-02-17 NOTE — Progress Notes (Signed)
[name] PHYSICAL MEDICINE & REHABILITATION PROGRESS NOTE   Subjective/Complaints: Patient seen sitting up in bed this morning.  She states she slept well overnight.  She states she has some nausea this AM.  She recently took medications.  ROS: + Nausea.  Denies CP, SOB, V/D  Objective:   No results found. No results for input(s): WBC, HGB, HCT, PLT in the last 72 hours. No results for input(s): NA, K, CL, CO2, GLUCOSE, BUN, CREATININE, CALCIUM in the last 72 hours.  Intake/Output Summary (Last 24 hours) at 02/17/2019 0939 Last data filed at 02/17/2019 0700 Gross per 24 hour  Intake 240 ml  Output -  Net 240 ml     Physical Exam: Vital Signs Blood pressure (!) 182/88, pulse 75, temperature 97.6 F (36.4 C), temperature source Oral, resp. rate 18, height 5\' 9"  (1.753 m), weight 100.4 kg, SpO2 95 %. Constitutional: No distress . Vital signs reviewed. HENT: Normocephalic.  Atraumatic.   Eyes: EOMI.  No discharge. Cardiovascular: No JVD. Respiratory: Normal effort. GI: Non-distended. Musc: No edema or tenderness in extremities. Skin: No evidence of breakdown, no evidence of rash Neurologic:  Alert and oriented Motor: Grossly 4+/5 throughout, stable Psych: Normal mood.  Normal behavior.  Assessment/Plan: 1. Functional deficits secondary to Left pontine lacunar infarct  which require 3+ hours per day of interdisciplinary therapy in a comprehensive inpatient rehab setting.  Physiatrist is providing close team supervision and 24 hour management of active medical problems listed below.  Physiatrist and rehab team continue to assess barriers to discharge/monitor patient progress toward functional and medical goals  Care Tool:  Bathing    Body parts bathed by patient: Right arm, Left lower leg, Face, Left arm, Chest, Abdomen, Front perineal area, Buttocks, Right upper leg, Left upper leg, Right lower leg         Bathing assist Assist Level: Supervision/Verbal cueing      Upper Body Dressing/Undressing Upper body dressing   What is the patient wearing?: Pull over shirt    Upper body assist Assist Level: Set up assist    Lower Body Dressing/Undressing Lower body dressing      What is the patient wearing?: Pants, Underwear/pull up     Lower body assist Assist for lower body dressing: Supervision/Verbal cueing     Toileting Toileting    Toileting assist Assist for toileting: Supervision/Verbal cueing     Transfers Chair/bed transfer  Transfers assist     Chair/bed transfer assist level: Contact Guard/Touching assist     Locomotion Ambulation   Ambulation assist      Assist level: Contact Guard/Touching assist Assistive device: Walker-rolling Max distance: 150'   Walk 10 feet activity   Assist     Assist level: Contact Guard/Touching assist Assistive device: Walker-rolling   Walk 50 feet activity   Assist    Assist level: Contact Guard/Touching assist Assistive device: Walker-rolling    Walk 150 feet activity   Assist    Assist level: Contact Guard/Touching assist Assistive device: Walker-rolling    Walk 10 feet on uneven surface  activity   Assist Walk 10 feet on uneven surfaces activity did not occur: Safety/medical concerns         Wheelchair     Assist Will patient use wheelchair at discharge?: No             Wheelchair 50 feet with 2 turns activity    Assist            Wheelchair 150 feet activity  Assist          Medical Problem List and Plan: 1.  Deficits with mobility, balance, endurance, self-care secondary to left pontine lacunar infarct on 02/11/2019.             Continue CIR 2.  Antithrombotics: -DVT/anticoagulation:  Pharmaceutical: Lovenox             -antiplatelet therapy: ASA/Plavix.  3. Pain Management: N/A 4. Mood: LCSW to follow for evaluation and support.              -antipsychotic agents: N/A 5. Neuropsych: This patient is capable of making  decisions on her own behalf. 6. Skin/Wound Care: Pressure relief measures. Increase fluid intake.  7. Fluids/Electrolytes/Nutrition: Monitor I/Os. 8.  Essential hypertension monitor BP   Continue Norvasc.  Hydralazine 10 3 times daily started on 7/11, increased to 50 3 times daily on 7/12  Remains uncontrolled with hypertensive crisis overnight, despite medication addition yesterday  Monitor with increased mobility. 9. 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  Monitor with increased mobility. CBG (last 3)  Recent Labs    02/16/19 1710 02/16/19 2057 02/17/19 0612  GLUCAP 186* 227* 150*   Increased Lantus to 12U BID  Labile on 7/12, monitor for trend 10.  AKI vs CKD:   Creatinine 1.34 on 7/9  Encourage fluids 11. OSA: Continue CPAP at bedtime.  12. Hypomagnesemia/ Hypokalemia:   KCL supplement 84meq BID  Labs ordered for tomorrow  Mg low nl cont current supplement 400mg  BID 13.  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

## 2019-02-17 NOTE — Progress Notes (Signed)
Physical Therapy Session Note  Patient Details  Name: [name] MRN: [id number] Date of Birth: 16-Aug-1970  Today's Date: 02/17/2019 PT Individual Time: 0808-0905 PT Individual Time Calculation (min): 57 min   Short Term Goals: Week 1:  PT Short Term Goal 1 (Week 1): =LTG due to ELOS  Skilled Therapeutic Interventions/Progress Updates: Pt presented in bed agreeable to therapy. Session focus on gait and dynamic balance. Pt denies pain throughout session. Pt performed bed mobility with supervision and donned shoes with R AFO supervision assist. Ambulated to rehab gym with RW and CGA with intermittent close S. Pt with intermittent R toe catching however pt aware and able to clear when focused on task. Pt participated in toe taps no AD x 10 bilaterally for alternating wt shift and coordination. Pt inclined to reach out for support however when encouraged able to perform with CGA and no LOB. Performed forward, backward, lateral, and diagonal steps in four square set up with minA fading to [place]. With verbal cues pt able to maintain forward hips and decrease trunk rotation. Performed standing shoulder flexion while standing on Airex progressing from minA for CGA for 1 minute bouts. Pt also performed peg board activity while standing on Airex, pt expressed increased anxiety regarding performing activity and pt instructing in breathing and calming support. After seated rest pt ambulated back to room with CGA fading to minA due to fatigue and decreased R foot clearance. Pt returned to bed at end of session and left with bed alarm on, call bell within reach and needs met.      Therapy Documentation Precautions:  Precautions Precautions: Fall, Other (comment) Precaution Comments: moniter HR and BP Restrictions Weight Bearing Restrictions: No General:   Vital Signs: Therapy Vitals Temp: 98.2 F (36.8 C) Temp Source: Oral Pulse Rate: 94 Resp: 19 BP: 138/73 Patient Position (if appropriate):  Lying Oxygen Therapy SpO2: 96 % O2 Device: Room Air    Therapy/Group: Individual Therapy  Jayra Choyce  Hagen Bohorquez, PTA  02/17/2019, 3:01 PM

## 2019-02-18 ENCOUNTER — Inpatient Hospital Stay (HOSPITAL_COMMUNITY): Payer: Medicaid Other | Admitting: Speech Pathology

## 2019-02-18 ENCOUNTER — Inpatient Hospital Stay (HOSPITAL_COMMUNITY): Payer: Medicaid Other | Admitting: Physical Therapy

## 2019-02-18 ENCOUNTER — Inpatient Hospital Stay (HOSPITAL_COMMUNITY): Payer: Medicaid Other | Admitting: Occupational Therapy

## 2019-02-18 LAB — GLUCOSE, CAPILLARY
Glucose-Capillary: 146 mg/dL — ABNORMAL HIGH (ref 70–99)
Glucose-Capillary: 101 mg/dL — ABNORMAL HIGH (ref 70–99)
Glucose-Capillary: 117 mg/dL — ABNORMAL HIGH (ref 70–99)
Glucose-Capillary: 196 mg/dL — ABNORMAL HIGH (ref 70–99)

## 2019-02-18 LAB — CBC
HCT: 37.4 % (ref 36.0–46.0)
Hemoglobin: 12.8 g/dL (ref 12.0–15.0)
MCH: 30 pg (ref 26.0–34.0)
MCHC: 34.2 g/dL (ref 30.0–36.0)
MCV: 87.8 fL (ref 80.0–100.0)
Platelets: 282 10*3/uL (ref 150–400)
RBC: 4.26 MIL/uL (ref 3.87–5.11)
RDW: 13.9 % (ref 11.5–15.5)
WBC: 6.8 10*3/uL (ref 4.0–10.5)
nRBC: 0 % (ref 0.0–0.2)

## 2019-02-18 LAB — BASIC METABOLIC PANEL
Anion gap: 8 (ref 5–15)
BUN: 20 mg/dL (ref 6–20)
CO2: 25 mmol/L (ref 22–32)
Calcium: 8.8 mg/dL — ABNORMAL LOW (ref 8.9–10.3)
Chloride: 104 mmol/L (ref 98–111)
Creatinine, Ser: 1.35 mg/dL — ABNORMAL HIGH (ref 0.44–1.00)
GFR calc Af Amer: 54 mL/min — ABNORMAL LOW (ref >60)
GFR calc non Af Amer: 47 mL/min — ABNORMAL LOW (ref >60)
Glucose, Bld: 144 mg/dL — ABNORMAL HIGH (ref 70–99)
Potassium: 3.9 mmol/L (ref 3.5–5.1)
Sodium: 137 mmol/L (ref 135–145)

## 2019-02-18 MED ORDER — METOPROLOL TARTRATE 25 MG PO TABS
25.0000 mg | ORAL_TABLET | Freq: Two times a day (BID) | ORAL | Status: DC
  Administered 2019-02-18 – 2019-02-20 (×5): 25 mg via ORAL
  Filled 2019-02-18 (×5): qty 1

## 2019-02-18 NOTE — Progress Notes (Signed)
Speech Language Pathology Daily Session Note  Patient Details  Name: [name] MRN: [id number] Date of Birth: 1971-04-18  Today's Date: 02/18/2019 SLP Individual Time: 1105-1200 SLP Individual Time Calculation (min): 55 min  Short Term Goals: Week 1: SLP Short Term Goal 1 (Week 1): STGs=LTGs due to short length of stay  Skilled Therapeutic Interventions: Skilled treatment session focused on cognitive-linguistic goals. SLP facilitated session by providing extra time and Min A verbal cues for use of word-finding strategies to maximize word retrievel during complex word-finding tasks. Patient demonstrated selective attention to tasks for ~45 minutes with Mod I in a mildly distracting environment. Patient transferred back to bed at end of session with supervision verbal cues needed for safety with task. Patient left upright in bed with NT present. Continue with current plan of care.      Pain No/Denies Pain   Therapy/Group: Individual Therapy  Shaindy Reader 02/18/2019, 12:42 PM

## 2019-02-18 NOTE — Care Management (Signed)
[place] Individual Statement of Services  Patient Name:  ANASTASHA ORTEZ  Date:  02/18/2019  Welcome to the [facility].  Our goal is to provide you with an individualized program based on your diagnosis and situation, designed to meet your specific needs.  With this comprehensive rehabilitation program, you will be expected to participate in at least 3 hours of rehabilitation therapies Monday-Friday, with modified therapy programming on the weekends.  Your rehabilitation program will include the following services:  Physical Therapy (PT), Occupational Therapy (OT), Speech Therapy (ST), 24 hour per day rehabilitation nursing, Therapeutic Recreaction (TR), Neuropsychology, Case Management (Social Worker), Rehabilitation Medicine, Nutrition Services and Pharmacy Services  Weekly team conferences will be held on Tuesdays to discuss your progress.  Your Social Worker will talk with you frequently to get your input and to update you on team discussions.  Team conferences with you and your family in attendance may also be held.  Expected length of stay: 5-7 days   Overall anticipated outcome: modified independent  Depending on your progress and recovery, your program may change. Your Social Worker will coordinate services and will keep you informed of any changes. Your Social Worker's name and contact numbers are listed  below.  The following services may also be recommended but are not provided by the [facility] will be made to provide these services after discharge if needed.  Arrangements include referral to agencies that provide these services.  Your insurance has been verified to be:  Medicaid Your primary doctor is:  Osei[facility]Bonsu  Pertinent information will be shared with your doctor and  your insurance company.  Social Worker:  [place], [place] or ([phone]   Information discussed with and copy given to patient by: Lennart Pall, 02/18/2019, 1:22 PM

## 2019-02-18 NOTE — Progress Notes (Signed)
Orthopedic Tech Progress Note Patient Details:  [name] 1971-06-01 [id number] Called [facility] for AFO. Patient ID: [name], female   DOB: 1971-02-17, 48 y.o.   MRN: [id number]   Melony Overly T 02/18/2019, 11:43 AM

## 2019-02-18 NOTE — Progress Notes (Signed)
[place] PHYSICAL MEDICINE & REHABILITATION PROGRESS NOTE   Subjective/Complaints: Pt in good spirits. Had a good night. Anxious to get home  ROS: Patient denies fever, rash, sore throat, blurred vision, nausea, vomiting, diarrhea, cough, shortness of breath or chest pain, joint or back pain, headache, or mood change.   Objective:   No results found. Recent Labs    02/18/19 0600  WBC 6.8  HGB 12.8  HCT 37.4  PLT 282   Recent Labs    02/18/19 0600  NA 137  K 3.9  CL 104  CO2 25  GLUCOSE 144*  BUN 20  CREATININE 1.35*  CALCIUM 8.8*    Intake/Output Summary (Last 24 hours) at 02/18/2019 0921 Last data filed at 02/18/2019 0900 Gross per 24 hour  Intake 360 ml  Output 3 ml  Net 357 ml     Physical Exam: Vital Signs Blood pressure (!) 181/92, pulse 84, temperature 98.6 F (37 C), temperature source Oral, resp. rate 18, height 5\' 9"  (1.753 m), weight 100.4 kg, SpO2 100 %. Constitutional: No distress . Vital signs reviewed. HEENT: EOMI, oral membranes moist Neck: supple Cardiovascular: RRR without murmur. No JVD    Respiratory: CTA Bilaterally without wheezes or rales. Normal effort    GI: BS +, non-tender, non-distended  Musc: No edema or tenderness in extremities. Skin: No evidence of breakdown, no evidence of rash Neurologic:  Alert and oriented Motor: Grossly 4+/5 throughout, legs a bit weaker than arms.  Psych: Normal mood.  Normal behavior.  Assessment/Plan: 1. Functional deficits secondary to Left pontine lacunar infarct  which require 3+ hours per day of interdisciplinary therapy in a comprehensive inpatient rehab setting.  Physiatrist is providing close team supervision and 24 hour management of active medical problems listed below.  Physiatrist and rehab team continue to assess barriers to discharge/monitor patient progress toward functional and medical goals  Care Tool:  Bathing    Body parts bathed by patient: Right arm, Left lower leg, Face,  Left arm, Chest, Abdomen, Front perineal area, Buttocks, Right upper leg, Left upper leg, Right lower leg         Bathing assist Assist Level: Supervision/Verbal cueing     Upper Body Dressing/Undressing Upper body dressing   What is the patient wearing?: Pull over shirt    Upper body assist Assist Level: Set up assist    Lower Body Dressing/Undressing Lower body dressing      What is the patient wearing?: Pants, Underwear/pull up     Lower body assist Assist for lower body dressing: Supervision/Verbal cueing     Toileting Toileting    Toileting assist Assist for toileting: Supervision/Verbal cueing     Transfers Chair/bed transfer  Transfers assist     Chair/bed transfer assist level: Contact Guard/Touching assist     Locomotion Ambulation   Ambulation assist      Assist level: Contact Guard/Touching assist Assistive device: Walker-rolling Max distance: 150'   Walk 10 feet activity   Assist     Assist level: Contact Guard/Touching assist Assistive device: Walker-rolling   Walk 50 feet activity   Assist    Assist level: Contact Guard/Touching assist Assistive device: Walker-rolling    Walk 150 feet activity   Assist    Assist level: Contact Guard/Touching assist Assistive device: Walker-rolling    Walk 10 feet on uneven surface  activity   Assist Walk 10 feet on uneven surfaces activity did not occur: Safety/medical concerns         Wheelchair  Assist Will patient use wheelchair at discharge?: No             Wheelchair 50 feet with 2 turns activity    Assist            Wheelchair 150 feet activity     Assist          Medical Problem List and Plan: 1.  Right hemiparesis and deficits with mobility, balance, endurance, self-care secondary to left pontine lacunar infarct on 02/11/2019.             Continue CIR PT, OT,   -expect short LOS 2.  Antithrombotics: -DVT/anticoagulation:   Pharmaceutical: Lovenox             -antiplatelet therapy: ASA/Plavix.  3. Pain Management: N/A 4. Mood: LCSW to follow for evaluation and support.              -antipsychotic agents: N/A 5. Neuropsych: This patient is capable of making decisions on her own behalf. 6. Skin/Wound Care: Pressure relief measures. Increase fluid intake.  7. Fluids/Electrolytes/Nutrition: Monitor I/Os. 8.  Essential hypertension: remains poorly controlled    Continue Norvasc  Hydralazine 10 3 times daily started on 7/11, increased to 50 3 times daily on 7/12  -add metoprolol 25mg  bid 7/13 9. 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  Monitor with increased mobility. CBG (last 3)  Recent Labs    02/17/19 1656 02/17/19 2326 02/18/19 0641  GLUCAP 181* 181* 146*   Increased Lantus to 12U BID  Poorly controlled 7/13, observe for trend.  10.  AKI vs CKD:   Creatinine 1.35 7/13  Encourage fluids 11. OSA: Continue CPAP at bedtime.  12. Hypomagnesemia/ Hypokalemia:   KCL supplement 68meq BID  3.9 today  Mg low nl cont current supplement 400mg  BID 13.  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

## 2019-02-18 NOTE — Progress Notes (Signed)
On arrival patient is already on NIV tolerating it well.

## 2019-02-18 NOTE — Progress Notes (Signed)
Occupational Therapy Session Note  Patient Details  Name: [name] MRN: [id number] Date of Birth: [birth year]  Today's Date: 02/18/2019 OT Individual Time: 9292-4462 OT Individual Time Calculation (min): 72 min    Short Term Goals: Week 1:  OT Short Term Goal 1 (Week 1): STG = LTGs due to ELOS  Skilled Therapeutic Interventions/Progress Updates:    Pt seen for OT ADL bathing/dressing session and session focusing on functional transfers. Pt sitting up in w/c upon arrival, eager to begin OT session. Pt had already gathered clothing items in prep for shower.  She was very impulsive throughout session, standing from w/c with RW across the room and B w/c leg rests still in place. VCs to slow down and for safety awareness throughout session. She ambulated into bathroom with RW, CGA, VCs for R foot clearance (AFO not donned), pt then began over exaggerating R foot clearance throwing balance off again.  She bathed seated on shower chair in shower with set-up and distant supervision. She dressed seated on toilet with supervision, VCs to complete in sitting vs. Standing to reduce fall risk. Dressing completed with supervision including donning of AFO.  Following seated rest break, she stripped and then made bed. Completed with supervision, not using AD. VCs for safety as pt stepping on cords etc. Without awareness. She ambulated with RW to ADL apartment with supervision, VCs to lessen UE reliance on RW. In ADL apartment, completed, sit<>stand from low soft surface couch with supervision.  Demonstration provided regarding tub transfer bench and transfer technique. Pt return demonstrated with supervision. She voiced liking this transfer method, reports having had falls stepping out of tub wall before and liked this method using the bench, declining attempt to practice stepping over the wall.  Pt ambulated back to room in same manner as described above. Left seated in w/c with all needs in reach,  reminded of use of call bell and need for assist with all mobility.   Therapy Documentation Precautions:  Precautions Precautions: Fall, Other (comment) Precaution Comments: moniter HR and BP Restrictions Weight Bearing Restrictions: No Pain:   No/denies pain   Therapy/Group: Individual Therapy  Shakiera Edelson L 02/18/2019, 7:06 AM

## 2019-02-18 NOTE — Progress Notes (Signed)
Physical Therapy Session Note  Patient Details  Name: [name] MRN: [id number] Date of Birth: 07/27/1971  Today's Date: 02/18/2019 PT Individual Time: 1430-1530 PT Individual Time Calculation (min): 60 min   Short Term Goals: Week 1:  PT Short Term Goal 1 (Week 1): =LTG due to ELOS  Skilled Therapeutic Interventions/Progress Updates:    Pt received seated in bed, agreeable to PT session. No complaints of pain. Pt performs bed mobility independently. Pt transfers with Supervision throughout session. Representative from [employer] present for orthotic assessment. Ambulation x 150 ft with RW and Supervision with use of PLS AFO, occasional toe catching. Trial gait with no AFO, RW and Supervision. Pt exhibits significant R foot drag without use of AFO. Orthotist recommends PLS AFO and toe cap for patient. Session focus on gait training and dynamic standing balance. Four-square stepping x 2 reps with min HHA, increased time and cues needed for stepping backwards. Agility ladder forwards step-through and lateral step-to with min HHA with focus on BLE clearance. Ambulation x 150 ft with SPC and CGA, improvement in balance since previous sessions but pt exhibits decreased gait speed, decreased smoothness of gait, and does continue to reach out with RLE for walls and doorways for stability. Will continue with RW for safety with gait. Toilet transfer with Supervision. Pt left seated in bed with needs in reach at end of session.  Therapy Documentation Precautions:  Precautions Precautions: Fall, Other (comment) Precaution Comments: moniter HR and BP Restrictions Weight Bearing Restrictions: No    Therapy/Group: Individual Therapy   Excell Seltzer, PT, DPT  02/18/2019, 4:00 PM

## 2019-02-19 ENCOUNTER — Inpatient Hospital Stay (HOSPITAL_COMMUNITY): Payer: Medicaid Other | Admitting: Physical Therapy

## 2019-02-19 ENCOUNTER — Inpatient Hospital Stay (HOSPITAL_COMMUNITY): Payer: Medicaid Other | Admitting: Occupational Therapy

## 2019-02-19 ENCOUNTER — Inpatient Hospital Stay (HOSPITAL_COMMUNITY): Payer: Medicaid Other | Admitting: Speech Pathology

## 2019-02-19 LAB — GLUCOSE, CAPILLARY
Glucose-Capillary: 134 mg/dL — ABNORMAL HIGH (ref 70–99)
Glucose-Capillary: 91 mg/dL (ref 70–99)
Glucose-Capillary: 122 mg/dL — ABNORMAL HIGH (ref 70–99)
Glucose-Capillary: 115 mg/dL — ABNORMAL HIGH (ref 70–99)
Glucose-Capillary: 130 mg/dL — ABNORMAL HIGH (ref 70–99)

## 2019-02-19 MED ORDER — INSULIN ASPART PROT & ASPART (70-30 MIX) 100 UNIT/ML ~~LOC~~ SUSP
15.0000 [IU] | Freq: Two times a day (BID) | SUBCUTANEOUS | Status: DC
  Administered 2019-02-19 – 2019-02-20 (×2): 15 [IU] via SUBCUTANEOUS
  Filled 2019-02-19: qty 10

## 2019-02-19 NOTE — Progress Notes (Signed)
Physical Therapy Discharge Summary  Patient Details  Name: [name] MRN: [id number] Date of Birth: 1971/01/19  Today's Date: 02/19/2019   Patient has met 6 of 6 long term goals due to improved activity tolerance, improved balance, improved postural control, ability to compensate for deficits, improved attention, improved awareness and improved coordination.  Patient to discharge at an ambulatory level Modified Independent.   [name] care partner is not necessary as pt is at mod I level to provide assistance at discharge.  Reasons goals not met: Pt has met all rehab goals.  Recommendation:  Patient will benefit from ongoing skilled PT services in home health setting to continue to advance safe functional mobility, address ongoing impairments in balance, endurance, safety, and minimize fall risk.  Equipment: RW  Reasons for discharge: treatment goals met and discharge from hospital  Patient/family agrees with progress made and goals achieved: Yes  PT Discharge Precautions/Restrictions Precautions Precautions: Fall;Other (comment) Precaution Comments: moniter HR and BP Restrictions Weight Bearing Restrictions: No Vision/Perception  Vision - History Baseline Vision: Wears glasses all the time Perception Perception: Within Functional Limits Praxis Praxis: Intact  Cognition Overall Cognitive Status: Within Functional Limits for tasks assessed Arousal/Alertness: Awake/alert Orientation Level: Oriented X4 Attention: Selective Focused Attention: Appears intact Sustained Attention: Appears intact Selective Attention: Appears intact Memory: Appears intact Awareness: Appears intact Problem Solving: Appears intact Safety/Judgment: Appears intact Sensation Sensation Light Touch: Impaired Detail Peripheral sensation comments: peripheral neuropathy at baseline; distal>proximal Proprioception: Impaired Detail Proprioception Impaired Details: Impaired  RLE Coordination Gross Motor Movements are Fluid and Coordinated: Yes Fine Motor Movements are Fluid and Coordinated: Yes Motor  Motor Motor: Within Functional Limits  Mobility Bed Mobility Bed Mobility: Rolling Right;Rolling Left;Supine to Sit;Sit to Supine Rolling Right: Independent Rolling Left: Independent Supine to Sit: Independent Sit to Supine: Independent Transfers Transfers: Sit to [employer] Transfers Sit to Stand: Independent with assistive device Stand Pivot Transfers: Independent with assistive device Transfer (Assistive device): Rolling walker Locomotion  Gait Ambulation: Yes Gait Assistance: Independent with assistive device Gait Distance (Feet): 200 Feet Assistive device: Rolling walker Gait Gait: Yes Gait Pattern: Impaired Gait Pattern: Decreased step length - right;Decreased step length - left;Decreased dorsiflexion - right;Wide base of support Gait velocity: decreased Stairs / Additional Locomotion Stairs: Yes Stairs Assistance: Contact Guard/Touching assist Stair Management Technique: Two rails;Step to pattern Number of Stairs: 12 Height of Stairs: 6 Ramp: Independent with assistive device Wheelchair Mobility Wheelchair Mobility: No  Trunk/Postural Assessment  Cervical Assessment Cervical Assessment: Within Functional Limits Thoracic Assessment Thoracic Assessment: Within Functional Limits Lumbar Assessment Lumbar Assessment: Within Functional Limits Postural Control Postural Control: Within Functional Limits  Balance Balance Balance Assessed: Yes Standardized Balance Assessment Standardized Balance Assessment: Berg Balance Test Berg Balance Test Sit to Stand: Able to stand without using hands and stabilize independently Standing Unsupported: Able to stand safely 2 minutes Sitting with Back Unsupported but Feet Supported on Floor or Stool: Able to sit safely and securely 2 minutes Stand to Sit: Sits safely with minimal use of  hands Transfers: Able to transfer safely, minor use of hands Standing Unsupported with Eyes Closed: Able to stand 10 seconds with supervision Standing Ubsupported with Feet Together: Able to place feet together independently and stand for 1 minute with supervision From Standing, Reach Forward with Outstretched Arm: Reaches forward but needs supervision From Standing Position, Pick up Object from Floor: Able to pick up shoe, needs supervision From Standing Position, Turn to Look Behind Over each Shoulder: Looks behind from both sides and  weight shifts well Turn 360 Degrees: Able to turn 360 degrees safely but slowly Standing Unsupported, Alternately Place Feet on Step/Stool: Able to complete >2 steps/needs minimal assist Standing Unsupported, One Foot in Front: Able to take small step independently and hold 30 seconds Standing on One Leg: Able to lift leg independently and hold > 10 seconds Total Score: 43 Static Sitting Balance Static Sitting - Balance Support: No upper extremity supported;Feet supported Static Sitting - Level of Assistance: 7: Independent Dynamic Sitting Balance Dynamic Sitting - Balance Support: No upper extremity supported;Feet supported;During functional activity Dynamic Sitting - Level of Assistance: 7: Independent Static Standing Balance Static Standing - Balance Support: No upper extremity supported;During functional activity Static Standing - Level of Assistance: 6: Modified independent (Device/Increase time) Dynamic Standing Balance Dynamic Standing - Balance Support: Bilateral upper extremity supported;During functional activity Dynamic Standing - Level of Assistance: 6: Modified independent (Device/Increase time) Extremity Assessment   RLE Assessment RLE Assessment: Within Functional Limits General Strength Comments: 4+/5 grossly LLE Assessment LLE Assessment: Within Functional Limits General Strength Comments: 4+/5 grossly     Excell Seltzer, PT,  DPT 02/19/2019, 3:25 PM

## 2019-02-19 NOTE — Progress Notes (Signed)
Hypoglycemic Event  CBG: 67  Treatment: 4 oz juice/soda  Symptoms: None  Follow-up CBG: Time:1150 CBG Result:91  Possible Reasons for Event: Unknown  Comments/MD notified:went up with standing order    [name], [place]

## 2019-02-19 NOTE — Progress Notes (Signed)
Occupational Therapy Discharge Summary  Patient Details  Name: [name] MRN: [id number] Date of Birth: 1971/04/17   Patient has met 12 of 12 long term goals due to improved activity tolerance, improved balance, postural control, ability to compensate for deficits, improved attention, improved awareness and improved coordination.  Patient to discharge at overall Modified Independent level using RW.  Pt with improved safety awareness during IPR admission. Recommending RW for ADL/IADL tasks in order to reduce fall risk. Patient to d/c home with children who she reports can provide PRN assist for IADLs if needed.   Recommendation:  Patient will benefit from ongoing skilled OT services in home health setting to continue to advance functional skills in the area of BADL and iADL.  Equipment: Tub transfer bench  Reasons for discharge: treatment goals met and discharge from hospital  Patient/family agrees with progress made and goals achieved: Yes  OT Discharge Precautions/Restrictions  Precautions Precautions: Fall;Other (comment) Precaution Comments: moniter HR and BP Restrictions Weight Bearing Restrictions: No Vision Baseline Vision/History: Wears glasses Wears Glasses: At all times Perception  Perception: Within Functional Limits Praxis Praxis: Intact Cognition Overall Cognitive Status: Within Functional Limits for tasks assessed Arousal/Alertness: Awake/alert Orientation Level: Oriented X4 Attention: Selective Focused Attention: Appears intact Sustained Attention: Appears intact Selective Attention: Appears intact Memory: Appears intact Awareness: Appears intact Problem Solving: Appears intact Safety/Judgment: Appears intact Sensation Sensation Light Touch: Impaired Detail Peripheral sensation comments: Tingling in R finger tips. Question CVA. vs CPT Light Touch Impaired Details: Impaired RUE Proprioception: Impaired Detail Proprioception Impaired Details:  Impaired RLE;Impaired LLE Coordination Gross Motor Movements are Fluid and Coordinated: Yes Fine Motor Movements are Fluid and Coordinated: Yes Motor  Motor Motor: Within Functional Limits Trunk/Postural Assessment  Cervical Assessment Cervical Assessment: Within Functional Limits Thoracic Assessment Thoracic Assessment: Within Functional Limits Lumbar Assessment Lumbar Assessment: Within Functional Limits Postural Control Postural Control: Within Functional Limits  Balance Balance Balance Assessed: Yes Static Sitting Balance Static Sitting - Balance Support: No upper extremity supported;Feet supported Static Sitting - Level of Assistance: 7: Independent Dynamic Sitting Balance Dynamic Sitting - Balance Support: No upper extremity supported;Feet supported;During functional activity Dynamic Sitting - Level of Assistance: 7: Independent Static Standing Balance Static Standing - Balance Support: No upper extremity supported;During functional activity Static Standing - Level of Assistance: 6: Modified independent (Device/Increase time) Dynamic Standing Balance Dynamic Standing - Balance Support: During functional activity Dynamic Standing - Level of Assistance: 6: Modified independent (Device/Increase time) Extremity/Trunk Assessment RUE Assessment RUE Assessment: Within Functional Limits General Strength Comments: grossly 4/5 at shoulder, 5/5 distal LUE Assessment LUE Assessment: Within Functional Limits   Shahram Alexopoulos L 02/19/2019, 9:25 AM

## 2019-02-19 NOTE — Plan of Care (Signed)
Problem: Consults Goal: RH STROKE PATIENT EDUCATION Description: See Patient Education module for education specifics  02/19/2019 1453 by Hillery Jacks, RN Outcome: Completed/Met 02/19/2019 0712 by Hillery Jacks, RN Outcome: Progressing Goal: Nutrition Consult-if indicated Description: Meal intake at least 60% 02/19/2019 1453 by Hillery Jacks, RN Outcome: Completed/Met 02/19/2019 0712 by Hillery Jacks, RN Outcome: Progressing Goal: Diabetes Guidelines if Diabetic/Glucose > 140 Description: If diabetic or lab glucose is > 140 mg/dl - Initiate Diabetes/Hyperglycemia Guidelines & Document Interventions  02/19/2019 1453 by Hillery Jacks, RN Outcome: Completed/Met 02/19/2019 0712 by Hillery Jacks, RN Outcome: Progressing   Problem: RH BOWEL ELIMINATION Goal: RH STG MANAGE BOWEL WITH ASSISTANCE Description: STG Manage Bowel with Mod I.  02/19/2019 1453 by Hillery Jacks, RN Outcome: Completed/Met 02/19/2019 0712 by Hillery Jacks, RN Outcome: Progressing Goal: RH STG MANAGE BOWEL W/MEDICATION W/ASSISTANCE Description: STG Manage Bowel with Medication with Mod I  02/19/2019 1453 by Hillery Jacks, RN Outcome: Completed/Met 02/19/2019 0712 by Hillery Jacks, RN Outcome: Progressing   Problem: RH BLADDER ELIMINATION Goal: RH STG MANAGE BLADDER WITH ASSISTANCE Description: STG Manage Bladder With Mod I 02/19/2019 1453 by Hillery Jacks, RN Outcome: Completed/Met 02/19/2019 0712 by Hillery Jacks, RN Outcome: Progressing Goal: RH STG MANAGE BLADDER WITH EQUIPMENT WITH ASSISTANCE Description: STG Manage Bladder With Equipment With mod I 02/19/2019 1453 by Hillery Jacks, RN Outcome: Completed/Met 02/19/2019 0712 by Hillery Jacks, RN Outcome: Progressing   Problem: RH SKIN INTEGRITY Goal: RH STG SKIN FREE OF INFECTION/BREAKDOWN Description: Assess skin every shift. Report abnormal findings 02/19/2019 1453 by Hillery Jacks, RN Outcome: Completed/Met 02/19/2019 9702 by  Hillery Jacks, RN Outcome: Progressing Goal: RH STG MAINTAIN SKIN INTEGRITY WITH ASSISTANCE Description: STG Maintain Skin Integrity With Mod I 02/19/2019 1453 by Hillery Jacks, RN Outcome: Completed/Met 02/19/2019 0712 by Hillery Jacks, RN Outcome: Progressing   Problem: RH SAFETY Goal: RH STG ADHERE TO SAFETY PRECAUTIONS W/ASSISTANCE/DEVICE Description: STG Adhere to Safety Precautions With Mod I.  02/19/2019 1453 by Hillery Jacks, RN Outcome: Completed/Met 02/19/2019 0712 by Hillery Jacks, RN Outcome: Progressing Goal: RH STG DECREASED RISK OF FALL WITH ASSISTANCE Description: STG Decreased Risk of Fall With Mod I 02/19/2019 1453 by Hillery Jacks, RN Outcome: Completed/Met 02/19/2019 6378 by Hillery Jacks, RN Outcome: Progressing   Problem: RH COGNITION-NURSING Goal: RH STG USES MEMORY AIDS/STRATEGIES W/ASSIST TO PROBLEM SOLVE Description: STG Uses Memory Aids/Strategies With mod I Assistance to Problem Solve. 02/19/2019 1453 by Hillery Jacks, RN Outcome: Completed/Met 02/19/2019 5885 by Hillery Jacks, RN Outcome: Progressing Goal: RH STG ANTICIPATES NEEDS/CALLS FOR ASSIST W/ASSIST/CUES Description: STG Anticipates Needs/Calls for Assist With Mod I Assistance/Cues. 02/19/2019 1453 by Hillery Jacks, RN Outcome: Completed/Met 02/19/2019 0277 by Hillery Jacks, RN Outcome: Progressing   Problem: RH PAIN MANAGEMENT Goal: RH STG PAIN MANAGED AT OR BELOW PT'S PAIN GOAL Description: Pain less than 3 on scale of 0-10.  02/19/2019 1453 by Hillery Jacks, RN Outcome: Completed/Met 02/19/2019 0712 by Hillery Jacks, RN Outcome: Progressing   Problem: RH KNOWLEDGE DEFICIT Goal: RH STG INCREASE KNOWLEDGE OF DIABETES Description: Pt will be able to verbalize at least 2 medications used to prevent stroke.  02/19/2019 1453 by Hillery Jacks, RN Outcome: Completed/Met 02/19/2019 4128 by Hillery Jacks, RN Outcome: Progressing Goal: RH STG INCREASE KNOWLEDGE OF  HYPERTENSION Description: Pt will be able to state at least two medications pt is taking to  control blood pressure.  02/19/2019 1453 by Hillery Jacks, RN Outcome: Completed/Met 02/19/2019 2761 by Hillery Jacks, RN Outcome: Progressing Goal: RH STG INCREASE KNOWLEGDE OF HYPERLIPIDEMIA Description: Pt will be able to verbalize medication used to treat hyperlipidemia.  02/19/2019 1453 by Hillery Jacks, RN Outcome: Completed/Met 02/19/2019 4709 by Hillery Jacks, RN Outcome: Progressing Goal: RH STG INCREASE KNOWLEDGE OF STROKE PROPHYLAXIS Description: Pt will be able to verbalize at least 2 measures used to prevent stroke.  02/19/2019 1453 by Hillery Jacks, RN Outcome: Completed/Met 02/19/2019 2957 by Hillery Jacks, RN Outcome: Progressing

## 2019-02-19 NOTE — Progress Notes (Signed)
Physical Therapy Session Note  Patient Details  Name: [name] MRN: [id number] Date of Birth: 07/23/1971  Today's Date: 02/19/2019 PT Individual Time: 1300-1400 PT Individual Time Calculation (min): 60 min   Short Term Goals: Week 1:  PT Short Term Goal 1 (Week 1): =LTG due to ELOS  Skilled Therapeutic Interventions/Progress Updates:    Pt received seated in w/c in room, agreeable to PT session. No complaints of pain. Pt transfers mod I with RW throughout session. Ambulation x 200 ft with RW mod I. Car transfer with RW mod I. Ambulation up/down ramp and across uneven surfaces with RW mod I. Ascend/descend 12 stairs with 2 handrails and CGA. Berg Balance Test [phone]. Patient demonstrates increased fall risk as noted by score of  43/56 on Berg Balance Scale.  (<36= high risk for falls, close to 100%; 37-45 significant >80%; 46-51 moderate >50%; 52-55 lower >25%). Pt demos improved score from 21/56 on 02/15/19. Reviewed pt's fall risk and functional implications of balance test results. Provided pt with HEP of standing balance exercises: tandem stance, Romberg stance, tandem gait, and side-steps with instructions to use AD and have Supervision when performing. Pt is safe to be mod I in her room with RW. Pt left seated in room with needs in reach at end of session.   Therapy Documentation Precautions:  Precautions Precautions: Fall, Other (comment) Precaution Comments: moniter HR and BP Restrictions Weight Bearing Restrictions: No    Therapy/Group: Individual Therapy   Excell Seltzer, PT, DPT  02/19/2019, 3:25 PM

## 2019-02-19 NOTE — Progress Notes (Addendum)
[facility] PHYSICAL MEDICINE & REHABILITATION PROGRESS NOTE   Subjective/Complaints: Up in chair. No new complaints.   ROS: Patient denies fever, rash, sore throat, blurred vision, nausea, vomiting, diarrhea, cough, shortness of breath or chest pain, joint or back pain, headache, or mood change.    Objective:   No results found. Recent Labs    02/18/19 0600  WBC 6.8  HGB 12.8  HCT 37.4  PLT 282   Recent Labs    02/18/19 0600  NA 137  K 3.9  CL 104  CO2 25  GLUCOSE 144*  BUN 20  CREATININE 1.35*  CALCIUM 8.8*    Intake/Output Summary (Last 24 hours) at 02/19/2019 0839 Last data filed at 02/18/2019 0900 Gross per 24 hour  Intake 120 ml  Output 1 ml  Net 119 ml     Physical Exam: Vital Signs Blood pressure (!) 170/89, pulse 81, temperature 98.8 F (37.1 C), temperature source Oral, resp. rate 18, height 5\' 9"  (1.753 m), weight 100.4 kg, SpO2 100 %. Constitutional: No distress . Vital signs reviewed. HEENT: EOMI, oral membranes moist Neck: supple Cardiovascular: RRR without murmur. No JVD    Respiratory: CTA Bilaterally without wheezes or rales. Normal effort    GI: BS +, non-tender, non-distended  Musc: No edema or tenderness in extremities. Skin: No evidence of breakdown, no evidence of rash Neurologic:  Alert and oriented Motor: Grossly 4+/5 throughout, legs a bit weaker than arms. Stable. Good sitting balance Psych: Normal mood.  Normal behavior.  Assessment/Plan: 1. Functional deficits secondary to Left pontine lacunar infarct  which require 3+ hours per day of interdisciplinary therapy in a comprehensive inpatient rehab setting.  Physiatrist is providing close team supervision and 24 hour management of active medical problems listed below.  Physiatrist and rehab team continue to assess barriers to discharge/monitor patient progress toward functional and medical goals  Care Tool:  Bathing    Body parts bathed by patient: Right arm, Left lower leg,  Face, Left arm, Chest, Abdomen, Front perineal area, Buttocks, Right upper leg, Left upper leg, Right lower leg         Bathing assist Assist Level: Supervision/Verbal cueing     Upper Body Dressing/Undressing Upper body dressing   What is the patient wearing?: Pull over shirt    Upper body assist Assist Level: Independent    Lower Body Dressing/Undressing Lower body dressing      What is the patient wearing?: Pants, Underwear/pull up     Lower body assist Assist for lower body dressing: Supervision/Verbal cueing     Toileting Toileting    Toileting assist Assist for toileting: Supervision/Verbal cueing     Transfers Chair/bed transfer  Transfers assist     Chair/bed transfer assist level: Supervision/Verbal cueing     Locomotion Ambulation   Ambulation assist      Assist level: Supervision/Verbal cueing Assistive device: Walker-rolling Max distance: 150'   Walk 10 feet activity   Assist     Assist level: Supervision/Verbal cueing Assistive device: Walker-rolling   Walk 50 feet activity   Assist    Assist level: Supervision/Verbal cueing Assistive device: Walker-rolling    Walk 150 feet activity   Assist    Assist level: Supervision/Verbal cueing Assistive device: Walker-rolling    Walk 10 feet on uneven surface  activity   Assist Walk 10 feet on uneven surfaces activity did not occur: Safety/medical concerns         Wheelchair     Assist Will patient use wheelchair  at discharge?: No             Wheelchair 50 feet with 2 turns activity    Assist            Wheelchair 150 feet activity     Assist          Medical Problem List and Plan: 1.  Right hemiparesis and deficits with mobility, balance, endurance, self-care secondary to left pontine lacunar infarct on 02/11/2019.             Continue CIR PT, OT,   -expect short LOS. Team conf, dc today or tomorrow? 2.   Antithrombotics: -DVT/anticoagulation:  Pharmaceutical: Lovenox             -antiplatelet therapy: ASA/Plavix.  3. Pain Management: N/A 4. Mood: LCSW to follow for evaluation and support.              -antipsychotic agents: N/A 5. Neuropsych: This patient is capable of making decisions on her own behalf. 6. Skin/Wound Care: Pressure relief measures. Increase fluid intake.  7. Fluids/Electrolytes/Nutrition: Monitor I/Os. 8.  Essential hypertension: remains poorly controlled    Continue Norvasc  Hydralazine 10 3 times daily started on 7/11, increased to 50 3 times daily on 7/12  -added metoprolol 25mg  bid 7/13  -continue to monitor today, further titration as outpt Patient Vitals for the past 24 hrs:  BP Temp Temp src Pulse Resp SpO2  02/19/19 0255 (!) 170/89 98.8 F (37.1 C) Oral 81 18 100 %  02/18/19 1914 (!) 187/93 97.7 F (36.5 C) - 89 17 100 %  02/18/19 1332 (!) 176/84 97.8 F (36.6 C) - 88 18 100 %   9. 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  Monitor with increased mobility. CBG (last 3)  Recent Labs    02/18/19 1706 02/18/19 2058 02/19/19 0601  GLUCAP 117* 196* 134*     Lantus   12U BID  Borderline control with increase. Will not change further but will need titration at home. Pt was on 70/30 insulin PTA 20u bid, will convert starting tonight if she's still here, 15u bid 10.  AKI vs CKD:   Creatinine 1.35 7/13  Encourage fluids 11. OSA: Continue CPAP at bedtime.  12. Hypomagnesemia/ Hypokalemia:   KCL supplement 11meq BID  3.9 7/13  Mg low nl cont current supplement 400mg  BID 13.  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

## 2019-02-19 NOTE — Progress Notes (Signed)
Speech Language Pathology Daily Session Note  Patient Details  Name: [name] MRN: [id number] Date of Birth: 12-Oct-1970  Today's Date: 02/19/2019 SLP Individual Time: 1028-9022 SLP Individual Time Calculation (min): 55 min  Short Term Goals: Week 1: SLP Short Term Goal 1 (Week 1): STGs=LTGs due to short length of stay  Skilled Therapeutic Interventions: Skilled treatment session focused on cognitive-linguistic goals. SLP facilitated session by providing supervision verbal cues for anticipatory awareness in regards to d/c planning. Patient also participated in an abstract and complex word-finding task with overall Min A verbal cues. However, patient is Mod I for word-finding during a complex informal conversation. Patient left upright in wheelchair with alarm on an all needs within reach. Continue with current plan of care.      Pain No/Denies Pain   Therapy/Group: Individual Therapy  Aphrodite Harpenau 02/19/2019, 12:42 PM

## 2019-02-19 NOTE — Progress Notes (Signed)
Speech Language Pathology Discharge Summary  Patient Details  Name: [name] MRN: [id number] Date of Birth: 06/23/1971  Patient has met 4 of 4 long term goals.  Patient to discharge at overall Modified Independent level.   Reasons goals not met: N/A   Clinical Impression/Discharge Summary: Patient has made excellent gains and has met 4 of 4 LTGs this admission. Currently, patient is overall Mod I for use of word-finding strategies at the conversation in order to express abstract thoughts and ideas. Patient also requires overall Mod I for complex problem solving, attention and recall with mildly complex and functional tasks. Patient education is complete and patient will discharge home with assistance from family. Patient appears to be at her baseline level of cognitive-linguistic functioning, therefore, f/u SLP services is not warranted at this time. Patient verbalized understanding and agreement.   Care Partner:  Caregiver Able to Provide Assistance: Yes     Recommendation:  None      Equipment: N/A   Reasons for discharge: Treatment goals met;Discharged from hospital   Patient/Family Agrees with Progress Made and Goals Achieved: Yes    ,  02/19/2019, 2:45 PM    

## 2019-02-19 NOTE — Progress Notes (Signed)
Occupational Therapy Session Note  Patient Details  Name: [name] MRN: [id number] Date of Birth: May 07, 1971  Today's Date: 02/19/2019 OT Individual Time: 4193-7902 OT Individual Time Calculation (min): 71 min    Short Term Goals: Week 1:  OT Short Term Goal 1 (Week 1): STG = LTGs due to ELOS  Skilled Therapeutic Interventions/Progress Updates:    Pt seen for OT ADL bathing/dressing session and session focusing on functional mobility and IADL re-training. Pt sitting up in w/c upon arrival, denied pain and agreeable to tx session. Discussed with pt d/c planning and determining AD for home. Recommend RW, however, only if pt able to demonstrate she can consistently use it in safe manner. Education provided regarding RW management in functional contexts, she voiced understanding. She used RW throughout session at [place] I level, good carry over of education/recommendations. She gathered items in prep for showering task, placing in walker bag and ambulating into bathroom to gather towels/washclothes from overhead storage with supervision. She bathed mod I seated on shower chair in shower.  She returned to EOB to dress mod I including R AFO. She ambulated back into bathroom and gathered dirty towels from floor, min cuing for RW management when picking up items with good carryover on subsequent trials.  She removed dirty sheets from bed and made bed at distant supervision-mod I level using RW appropriately throughout.  She then ambulated to therapy gym using RW with supervision, pt self-cuing to lessen UE reliance on RW and picking up of R foot. In therapy gym, education and demonstration provided regarding fall recovery including calling 911 if injury is suspected. Pt return demonstrated floor transfer with supervision. She reports history of multiple falls PTA, none with injury. Pt ambulated back to room at end of session, left seated in w/c with all needs in reach.   Therapy  Documentation Precautions:  Precautions Precautions: Fall, Other (comment) Precaution Comments: moniter HR and BP Restrictions Weight Bearing Restrictions: No Pain:   No/denies pain   Therapy/Group: Individual Therapy  [name] L 02/19/2019, 7:02 AM

## 2019-02-19 NOTE — Plan of Care (Signed)
  Problem: Consults Goal: RH STROKE PATIENT EDUCATION Description: See Patient Education module for education specifics  Outcome: Progressing Goal: Nutrition Consult-if indicated Description: Meal intake at least 60% Outcome: Progressing Goal: Diabetes Guidelines if Diabetic/Glucose > 140 Description: If diabetic or lab glucose is > 140 mg/dl - Initiate Diabetes/Hyperglycemia Guidelines & Document Interventions  Outcome: Progressing   Problem: RH BOWEL ELIMINATION Goal: RH STG MANAGE BOWEL WITH ASSISTANCE Description: STG Manage Bowel with Mod I.  Outcome: Progressing Goal: RH STG MANAGE BOWEL W/MEDICATION W/ASSISTANCE Description: STG Manage Bowel with Medication with Mod I  Outcome: Progressing   Problem: RH BLADDER ELIMINATION Goal: RH STG MANAGE BLADDER WITH ASSISTANCE Description: STG Manage Bladder With Mod I Outcome: Progressing Goal: RH STG MANAGE BLADDER WITH EQUIPMENT WITH ASSISTANCE Description: STG Manage Bladder With Equipment With mod I Outcome: Progressing   Problem: RH SKIN INTEGRITY Goal: RH STG SKIN FREE OF INFECTION/BREAKDOWN Description: Assess skin every shift. Report abnormal findings Outcome: Progressing Goal: RH STG MAINTAIN SKIN INTEGRITY WITH ASSISTANCE Description: STG Maintain Skin Integrity With Mod I Outcome: Progressing   Problem: RH SAFETY Goal: RH STG ADHERE TO SAFETY PRECAUTIONS W/ASSISTANCE/DEVICE Description: STG Adhere to Safety Precautions With Mod I.  Outcome: Progressing Goal: RH STG DECREASED RISK OF FALL WITH ASSISTANCE Description: STG Decreased Risk of Fall With Mod I Outcome: Progressing   Problem: RH COGNITION-NURSING Goal: RH STG USES MEMORY AIDS/STRATEGIES W/ASSIST TO PROBLEM SOLVE Description: STG Uses Memory Aids/Strategies With mod I Assistance to Problem Solve. Outcome: Progressing Goal: RH STG ANTICIPATES NEEDS/CALLS FOR ASSIST W/ASSIST/CUES Description: STG Anticipates Needs/Calls for Assist With Mod I  Assistance/Cues. Outcome: Progressing   Problem: RH PAIN MANAGEMENT Goal: RH STG PAIN MANAGED AT OR BELOW PT'S PAIN GOAL Description: Pain less than 3 on scale of 0-10.  Outcome: Progressing   Problem: RH KNOWLEDGE DEFICIT Goal: RH STG INCREASE KNOWLEDGE OF DIABETES Description: Pt will be able to verbalize at least 2 medications used to prevent stroke.  Outcome: Progressing Goal: RH STG INCREASE KNOWLEDGE OF HYPERTENSION Description: Pt will be able to state at least two medications pt is taking to control blood pressure.  Outcome: Progressing Goal: RH STG INCREASE KNOWLEGDE OF HYPERLIPIDEMIA Description: Pt will be able to verbalize medication used to treat hyperlipidemia.  Outcome: Progressing Goal: RH STG INCREASE KNOWLEDGE OF STROKE PROPHYLAXIS Description: Pt will be able to verbalize at least 2 measures used to prevent stroke.  Outcome: Progressing

## 2019-02-20 ENCOUNTER — Inpatient Hospital Stay (HOSPITAL_COMMUNITY): Payer: Medicaid Other | Admitting: Speech Pathology

## 2019-02-20 ENCOUNTER — Encounter (HOSPITAL_COMMUNITY): Payer: Self-pay

## 2019-02-20 DIAGNOSIS — Z9114 Patient's other noncompliance with medication regimen: Secondary | ICD-10-CM

## 2019-02-20 LAB — GLUCOSE, CAPILLARY
Glucose-Capillary: 66 mg/dL — ABNORMAL LOW (ref 70–99)
Glucose-Capillary: 110 mg/dL — ABNORMAL HIGH (ref 70–99)
Glucose-Capillary: 64 mg/dL — ABNORMAL LOW (ref 70–99)

## 2019-02-20 MED ORDER — CLOPIDOGREL BISULFATE 75 MG PO TABS: 75.0000 mg | ORAL_TABLET | Freq: Every day | ORAL | 0 refills | Status: AC

## 2019-02-20 MED ORDER — HYDRALAZINE HCL 50 MG PO TABS: 50.0000 mg | ORAL_TABLET | Freq: Three times a day (TID) | ORAL | 1 refills | Status: DC

## 2019-02-20 MED ORDER — ATORVASTATIN CALCIUM 40 MG PO TABS: 40.0000 mg | ORAL_TABLET | Freq: Every day | ORAL | 1 refills | Status: DC

## 2019-02-20 MED ORDER — CLOPIDOGREL BISULFATE 75 MG PO TABS: 75.0000 mg | ORAL_TABLET | Freq: Every day | ORAL | 0 refills | Status: DC

## 2019-02-20 MED ORDER — INSULIN SYRINGES (DISPOSABLE) U-100 1 ML MISC: 1.0000 "application " | Freq: Two times a day (BID) | 0 refills | Status: DC

## 2019-02-20 MED ORDER — POTASSIUM CHLORIDE CRYS ER 10 MEQ PO TBCR: 10.0000 meq | EXTENDED_RELEASE_TABLET | Freq: Two times a day (BID) | ORAL | 1 refills | Status: DC

## 2019-02-20 MED ORDER — MAGNESIUM OXIDE 400 (241.3 MG) MG PO TABS: 400.0000 mg | ORAL_TABLET | Freq: Two times a day (BID) | ORAL | 1 refills | Status: DC

## 2019-02-20 MED ORDER — POLYETHYLENE GLYCOL 3350 17 G PO PACK: 17.0000 g | PACK | Freq: Two times a day (BID) | ORAL | 0 refills | Status: DC

## 2019-02-20 MED ORDER — METOPROLOL TARTRATE 25 MG PO TABS: 25.0000 mg | ORAL_TABLET | Freq: Two times a day (BID) | ORAL | 1 refills | Status: DC

## 2019-02-20 MED ORDER — INSULIN ASPART PROT & ASPART (70-30 MIX) 100 UNIT/ML ~~LOC~~ SUSP: 15.0000 [IU] | Freq: Two times a day (BID) | SUBCUTANEOUS | 11 refills | Status: DC

## 2019-02-20 MED ORDER — AMLODIPINE BESYLATE 10 MG PO TABS: 10.0000 mg | ORAL_TABLET | Freq: Every day | ORAL | 1 refills | Status: DC

## 2019-02-20 NOTE — Progress Notes (Signed)
Patient given discharge instructions, no questions or concerns. CBG 64 on check prior to discharge, PA aware, patient given snack to eat on ride home. All belongings brought to car with patient

## 2019-02-20 NOTE — Progress Notes (Signed)
[facility] PHYSICAL MEDICINE & REHABILITATION PROGRESS NOTE   Subjective/Complaints: No new issues. Excited about going home  ROS: Patient denies fever, rash, sore throat, blurred vision, nausea, vomiting, diarrhea, cough, shortness of breath or chest pain, joint or back pain, headache, or mood change.    Objective:   No results found. Recent Labs    02/18/19 0600  WBC 6.8  HGB 12.8  HCT 37.4  PLT 282   Recent Labs    02/18/19 0600  NA 137  K 3.9  CL 104  CO2 25  GLUCOSE 144*  BUN 20  CREATININE 1.35*  CALCIUM 8.8*    Intake/Output Summary (Last 24 hours) at 02/20/2019 0826 Last data filed at 02/19/2019 1853 Gross per 24 hour  Intake 480 ml  Output -  Net 480 ml     Physical Exam: Vital Signs Blood pressure (!) 184/93, pulse 91, temperature 98 F (36.7 C), resp. rate 18, height 5\' 9"  (1.753 m), weight 100.4 kg, SpO2 100 %. Constitutional: No distress . Vital signs reviewed. HEENT: EOMI, oral membranes moist Neck: supple Cardiovascular: RRR without murmur. No JVD    Respiratory: CTA Bilaterally without wheezes or rales. Normal effort    GI: BS +, non-tender, non-distended  Musc: No edema or tenderness in extremities. Skin: No evidence of breakdown, no evidence of rash Neurologic:  Alert and oriented Motor: Grossly 4+/5 throughout, legs remain slt weaker than arms. Stable. Good sitting balance Psych: pleasant.  Assessment/Plan: 1. Functional deficits secondary to Left pontine lacunar infarct  which require 3+ hours per day of interdisciplinary therapy in a comprehensive inpatient rehab setting.  Physiatrist is providing close team supervision and 24 hour management of active medical problems listed below.  Physiatrist and rehab team continue to assess barriers to discharge/monitor patient progress toward functional and medical goals  Care Tool:  Bathing    Body parts bathed by patient: Right arm, Left lower leg, Face, Left arm, Chest, Abdomen, Front  perineal area, Buttocks, Right upper leg, Left upper leg, Right lower leg         Bathing assist Assist Level: Independent with assistive device Assistive Device Comment: Seated on shower chair   Upper Body Dressing/Undressing Upper body dressing   What is the patient wearing?: Pull over shirt    Upper body assist Assist Level: Independent    Lower Body Dressing/Undressing Lower body dressing      What is the patient wearing?: Pants, Underwear/pull up     Lower body assist Assist for lower body dressing: Independent with assitive device Assistive Device Comment: Standing with RW to pull pants up   Toileting Toileting    Toileting assist Assist for toileting: Independent with assistive device Assistive Device Comment: RW   Transfers Chair/bed transfer  Transfers assist     Chair/bed transfer assist level: Independent with assistive device Chair/bed transfer assistive device: Programmer, multimedia   Ambulation assist      Assist level: Independent with assistive device Assistive device: Walker-rolling Max distance: 200'   Walk 10 feet activity   Assist     Assist level: Independent with assistive device Assistive device: Walker-rolling   Walk 50 feet activity   Assist    Assist level: Independent with assistive device Assistive device: Walker-rolling    Walk 150 feet activity   Assist    Assist level: Independent with assistive device Assistive device: Walker-rolling    Walk 10 feet on uneven surface  activity   Assist Walk 10 feet on  uneven surfaces activity did not occur: Safety/medical concerns   Assist level: Independent with assistive device Assistive device: Aeronautical engineer Will patient use wheelchair at discharge?: No             Wheelchair 50 feet with 2 turns activity    Assist            Wheelchair 150 feet activity     Assist          Medical Problem List  and Plan: 1.  Right hemiparesis and deficits with mobility, balance, endurance, self-care secondary to left pontine lacunar infarct on 02/11/2019.             -dc today  -Patient to see Rehab MD/provider in the office for transitional care encounter in 1-2 weeks.  2.  Antithrombotics: -DVT/anticoagulation:  Pharmaceutical: Lovenox             -antiplatelet therapy: ASA/Plavix.  3. Pain Management: N/A 4. Mood: LCSW to follow for evaluation and support.              -antipsychotic agents: N/A 5. Neuropsych: This patient is capable of making decisions on her own behalf. 6. Skin/Wound Care: Pressure relief measures. Increase fluid intake.  7. Fluids/Electrolytes/Nutrition: Monitor I/Os. 8.  Essential hypertension: remains poorly controlled    Continue Norvasc  Hydralazine 10 3 times daily started on 7/11, increased to 50 3 times daily on 7/12  -added metoprolol 25mg  bid 7/13--some improvement  -likely to need further titration as outpt Patient Vitals for the past 24 hrs:  BP Temp Pulse Resp SpO2  02/20/19 0748 (!) 184/93 - 91 - -  02/20/19 0444 (!) 179/96 98 F (36.7 C) 87 18 -  02/19/19 1922 (!) 159/80 98.4 F (36.9 C) 88 18 100 %  02/19/19 1416 (!) 163/88 - 82 18 98 %   9. 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  improved control CBG (last 3)  Recent Labs    02/19/19 1732 02/19/19 2140 02/20/19 0559  GLUCAP 115* 130* 110*    converted back to 70/30 insulin last night, 15u bid 10.  AKI vs CKD:   Creatinine 1.35 7/13  Encourage fluids 11. OSA: Continue CPAP at bedtime.  12. Hypomagnesemia/ Hypokalemia:   KCL supplement 24meq BID  3.9 7/13  Mg low nl cont current supplement 400mg  BID 13.  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

## 2019-02-20 NOTE — Patient Care Conference (Signed)
Inpatient RehabilitationTeam Conference and Plan of Care Update Date: 02/19/2019   Time: 2:05 PM    Patient Name: [name]      Medical Record Number: [id number]  Date of Birth: [birth year] Sex: Female         Room/Bed: 4W01C/4W01C-01 Payor Info: Payor: MEDICAID [place] / Plan: MEDICAID [place] ACCESS / Product Type: *No Product type* /    Admitting Diagnosis: 1. SCI Team  LT. CVA; 10-12days  Admit Date/Time:  02/13/2019  3:41 PM Admission Comments: No comment available   Primary Diagnosis:  <principal problem not specified> Principal Problem: <principal problem not specified>  Patient Active Problem List   Diagnosis Date Noted  . Labile blood glucose   . Diabetes mellitus type 2 in obese ([place])   . Benign essential HTN   . Hypertensive crisis   . 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[place]) 02/13/2019  . Cerebrovascular accident (CVA) ([place])   . Hypokalemia   . Hypomagnesemia   . AKI (acute kidney injury) ([place])   . Acute CVA (cerebrovascular accident) ([place]) 02/11/2019  . Hypertensive urgency 02/11/2019  . ARF (acute renal failure) ([place]) 02/11/2019  . Type 2 diabetes mellitus with vascular disease ([place]) 02/11/2019  . Diabetes mellitus, type 2 ([place]) 04/12/2018  . Hypertension associated with diabetes ([place]) 01/08/2018  . Obstructive sleep apnea 08/26/2017    Expected Discharge Date: Expected Discharge Date: 02/20/19  Team Members Present: Physician leading conference: Dr. Alger Simons Social Worker Present: Lennart Pall, LCSW Nurse Present: Dwaine Gale, RN PT Present: Excell Seltzer, PT OT Present: Amy Rounds, OT SLP Present: Weston Anna, SLP PPS Coordinator present : Gunnar Fusi, SLP     Current Status/Progress Goal Weekly Team Focus  Medical   left pontine infarct, HTN, DM  improve balance and functional mobility  stabilize medically for discharge   Bowel/Bladder   continent of bowel & bladder, LBM 02/17/19  remain continent  monitor for changes & assist as needed    Swallow/Nutrition/ Hydration             ADL's   Supervision-mod I overall, alternating AD to find safest method  Mod I ADLs and IADLs  Safety awareness, ADL/IADL re-training, d/c planning   Mobility   bed mob indep, transfers Supervision with RW, gait 200' RW Supervision with R AFO  mod I  dynamic standing balance, gait training, safety awareness   Communication   Supervision  Supervision  Family Education   Safety/Cognition/ Behavioral Observations  Supervision  Supervision  Family Education   Pain   no c/o pain on shift, has tylenol prn  pain scale <4/10  assess & treat as needed   Skin   no skin issues  no new areas of skin breakdown  assess q shift    Rehab Goals Patient on target to meet rehab goals: Yes *See Care Plan and progress notes for long and short-term goals.     Barriers to Discharge  Current Status/Progress Possible Resolutions Date Resolved   Physician    Medical stability        see medical progress notes      Nursing                  PT                    OT                  SLP  SW                Discharge Planning/Teaching Needs:  Pt to d/c home with teen-aged children who can provide supervision/ assistance.  NA - mod independent   Team Discussion:  Has met all goals and team feels ready for d/c tomorrow.  Monitor BS will be ongoing.  Supervision to modified ind overall.  No medical or nursing concerns.  Revisions to Treatment Plan:  NA    Continued Need for Acute Rehabilitation Level of Care: The patient requires daily medical management by a physician with specialized training in physical medicine and rehabilitation for the following conditions: Daily direction of a multidisciplinary physical rehabilitation program to ensure safe treatment while eliciting the highest outcome that is of practical value to the patient.: Yes Daily medical management of patient stability for increased activity during participation in an  intensive rehabilitation regime.: Yes Daily analysis of laboratory values and/or radiology reports with any subsequent need for medication adjustment of medical intervention for : Neurological problems;Blood pressure problems;Diabetes problems   I attest that I was present, lead the team conference, and concur with the assessment and plan of the team.   Lennart Pall 02/20/2019, 8:42 AM   Team conference was held via web/ teleconference due to [place] - 19

## 2019-02-20 NOTE — Progress Notes (Signed)
Social Work Discharge Note   The overall goal for the admission was met for:   Discharge location: Yes - home with teen-aged and children and family providing any needed assistance  Length of Stay: Yes - 7 days  Discharge activity level: Yes - modified independent overall  Home/community participation: Yes  Services provided included: MD, RD, PT, OT, SLP, RN, TR, Pharmacy and [place]: Medicaid  Follow-up services arranged: Home Health: PT, OT via [facility], DME: rolling walker, tub bench via [facility] and Patient/Family has no preference for HH/DME agencies  Comments (or additional information):      Contact info:  Pt @ [phone]  Patient/Family verbalized understanding of follow-up arrangements: Yes  Individual responsible for coordination of the follow-up plan: pt  Confirmed correct DME delivered: Tavarius Grewe 02/20/2019    Marvelene Stoneberg

## 2019-02-20 NOTE — Discharge Instructions (Signed)
Inpatient Rehab Discharge Instructions  [name] Discharge date and time: 02/20/19   Activities/Precautions/ Functional Status: Activity: no lifting, driving, or strenuous exercise till cleared by MD Diet: cardiac diet and diabetic diet Wound Care: none needed    Functional status:  ___ No restrictions     ___ Walk up steps independently ___ 24/7 supervision/assistance   ___ Walk up steps with assistance _X__ Intermittent supervision/assistance  ___ Bathe/dress independently ___ Walk with walker     ___ Bathe/dress with assistance ___ Walk Independently    ___ Shower independently ___ Walk with assistance    ___ Shower with assistance _X__ No alcohol     ___ Return to work/school ________    COMMUNITY REFERRALS UPON DISCHARGE:    Home Health:   PT     OT                       Agency:  [facility]   Phone: [phone]   Medical Equipment/Items Ordered:  Gilford Rile, tub bench                                                       Agency/Supplier:  [facility] @ [phone]        Special Instructions: 1. Monitor blood sugars before meals and at bedtime.  2. You only need plavix for 2 more weeks. Continue aspirin indefinitely.   STROKE/TIA DISCHARGE INSTRUCTIONS SMOKING Cigarette smoking nearly doubles your risk of having a stroke & is the single most alterable risk factor  If you smoke or have smoked in the last 12 months, you are advised to quit smoking for your health.  Most of the excess cardiovascular risk related to smoking disappears within a year of stopping.  Ask you doctor about anti-smoking medications  [place] Quit Line: 1-800-QUIT NOW  Free Smoking Cessation Classes [phone]  CHOLESTEROL Know your levels; limit fat & cholesterol in your diet  Lipid Panel     Component Value Date/Time   CHOL 170 02/12/2019 0537   TRIG 385 (H) 02/12/2019 0537   HDL 35 (L) 02/12/2019 0537   CHOLHDL 4.9 02/12/2019 0537   VLDL 77 (H) 02/12/2019 0537   LDLCALC 58 02/12/2019 0537      Many patients benefit from treatment even if their cholesterol is at goal.  Goal: Total Cholesterol (CHOL) less than 160  Goal:  Triglycerides (TRIG) less than 150  Goal:  HDL greater than 40  Goal:  LDL (LDLCALC) less than 100   BLOOD PRESSURE American Stroke Association blood pressure target is less that 120/80 mm/Hg  Your discharge blood pressure is:  BP: (!) 184/93  Monitor your blood pressure  Limit your salt and alcohol intake  Many individuals will require more than one medication for high blood pressure  DIABETES (A1c is a blood sugar average for last 3 months) Goal HGBA1c is under 7% (HBGA1c is blood sugar average for last 3 months)  Diabetes:     Lab Results  Component Value Date   HGBA1C 8.8 (H) 02/12/2019     Your HGBA1c can be lowered with medications, healthy diet, and exercise.  Check your blood sugar as directed by your physician  Call your physician if you experience unexplained or low blood sugars.  PHYSICAL ACTIVITY/REHABILITATION Goal is 30 minutes at least  4 days per week  Activity: No driving, Therapies:  See above  Return to work: n/A  Activity decreases your risk of heart attack and stroke and makes your heart stronger.  It helps control your weight and blood pressure; helps you relax and can improve your mood.  Participate in a regular exercise program.  Talk with your doctor about the best form of exercise for you (dancing, walking, swimming, cycling).  DIET/WEIGHT Goal is to maintain a healthy weight  Your discharge diet is:  Diet Order            Diet Carb Modified Fluid consistency: Thin; Room service appropriate? Yes  Diet effective now            liquids Your height is:  Height: 5\' 9"  (175.3 cm) Your current weight is: Weight: 100.4 kg Your Body Mass Index (BMI) is:  BMI (Calculated): 32.67  Following the type of diet specifically designed for you will help prevent another stroke.  Your goal  weight is:    Your goal Body Mass Index (BMI) is 19-24.  Healthy food habits can help reduce 3 risk factors for stroke:  High cholesterol, hypertension, and excess weight.  RESOURCES Stroke/Support Group:  Call [phone]   STROKE EDUCATION PROVIDED/REVIEWED AND GIVEN TO PATIENT Stroke warning signs and symptoms How to activate emergency medical system (call 911). Medications prescribed at discharge. Need for follow-up after discharge. Personal risk factors for stroke. Pneumonia vaccine given:  Flu vaccine given:  My questions have been answered, the writing is legible, and I understand these instructions.  I will adhere to these goals & educational materials that have been provided to me after my discharge from the hospital.     My questions have been answered and I understand these instructions. I will adhere to these goals and the provided educational materials after my discharge from the hospital.  Patient/Caregiver Signature _______________________________ Date __________  Clinician Signature _______________________________________ Date __________  Please bring this form and your medication list with you to all your follow-up doctor's appointments. Heart-Healthy Consistent    Carbohydrate NutritionTherapy A heart-healthy and consistent carbohydrate diet is recommended to manage heart disease and diabetes. To follow a heart-healthy and consistent carbohydrate diet, Eat a balanced diet with whole grains, fruits and vegetables, and lean protein sources. hoose heart-healthy unsaturated fats. Limit saturated fats, trans fats, and cholesterol intake. Eat more plant-based or vegetarian meals using beans and soy foods for protein. Eat whole, unprocessed foods to limit the amount of sodium (salt) you eat. Choose a consistent amount of carbohydrate at each meal and snack. Limit refined carbohydrates especially sugar, sweets and sugar-sweetened beverages. If you drink alcohol, do so  in moderation: one serving per day (women) and two servings per day (men).  One serving is equivalent to 12 ounces beer, 5 ounces wine, or 1.5 ounces distilled spirits Tips Tips for Choosing Heart-Healthy Fats Choose lean protein and low-fat dairy foods to reduce saturated fat intake. Saturated fat is usually found in animal-based protein and is associated with certain health risks. Saturated fat is the biggest contributor to raise low-density lipoprotein (LDL) cholesterol levels. Research shows that limiting saturated fat lowers unhealthy cholesterol levels. Eat no more than 7% of your total calories each day from saturated fat. Ask your RDN to help you determine how much saturated fat is right for you. There are many foods that do not contain large amounts of saturated fats. Swapping these foods to replace foods high in saturated fats will help you  limit the saturated fat you eat and improve your cholesterol levels. You can also try eating more plant-based or vegetarian meals. Instead of Try: Whole milk, cheese, yogurt, and ice cream 1% or skim milk, low-fat cheese, non-fat yogurt, and low-fat ice cream Fatty, marbled beef and pork Lean beef, pork, or venison Poultry with skin Poultry without skin Butter, stick margarine Reduced-fat, whipped, or liquid spreads Coconut oil, palm oil Liquid vegetable oils: corn, canola, olive, soybean and safflower oils Avoid foods that contain trans fats. Trans fats increase levels of LDL-cholesterol. Hydrogenated fat in processed foods is the main source of trans fats in foods. Trans fats can be found in stick margarine, shortening, processed sweets, baked goods, some fried foods, and packaged foods made with hydrogenated oils. Avoid foods with partially hydrogenated oil on the ingredient list such as: cookies, pastries, baked goods, biscuits, crackers, microwave popcorn, and frozen dinners. Choose foods with heart healthy fats. Polyunsaturated and  monounsaturated fat are unsaturated fats that may help lower your blood cholesterol level when used in place of saturated fat in your diet. Ask your RDN about taking a dietary supplement with plant sterols and stanols to help lower your cholesterol level. Research shows that substituting saturated fats with unsaturated fats is beneficial to cholesterol levels. Try these easy swaps: Instead of Try: Butter, stick margarine, or solid shortening Reduced-fat, whipped, or liquid spreads Beef, pork, or poultry with skin Fish and seafood Chips, crackers, snack foods Raw or unsalted nuts and seeds or nut butters Hummus with vegetables Avocado on toast Coconut oil, palm oil Liquid vegetable oils: corn, canola, olive, soybean and safflower oils Limit the amount of cholesterol you eat to less than 200 milligrams per day. Cholesterol is a substance carried through the bloodstream via lipoproteins, which are known as transporters of fat. Some body functions need cholesterol to work properly, but too much cholesterol in the bloodstream can damage arteries and build up blood vessel linings (which can lead to heart attack and stroke). You should eat less than 200 milligrams cholesterol per day. People respond differently to eating cholesterol. There is no test available right now that can figure out which people will respond more to dietary cholesterol and which will respond less. For individuals with high intake of dietary cholesterol, different types of increase (none, small, moderate, large) in LDL-cholesterol levels are all possible. Food sources of cholesterol include egg yolks and organ meats such as liver, gizzards. Limit egg yolks to two to four per week and avoid organ meats like liver and gizzards to control cholesterol intake. Tips for Choosing Heart-Healthy Carbohydrates Copyright Academy of Nutrition and Dietetics. This handout may be duplicated for client education. Page 3/6 Consume a  consistent amount of carbohydrate It is important to eat foods with carbohydrates in moderation because they impact your blood glucose level. Carbohydrates can be found in many foods such as: Grains (breads, crackers, rice, pasta, and cereals) Starchy Vegetables (potatoes, corn, and peas) Beans and legumes Milk, soy milk, and yogurt Fruit and fruit juice Sweets (cakes, cookies, ice cream, jam and jelly) Your RDN will help you set a goal for how many carbohydrate servings to eat at your meals and snacks. For many adults, eating 3 to 5 servings of carbohydrate foods at each meal and 1 or 2 carbohydrate servings for each snack works well. Check your blood glucose level regularly. It can tell you if you need to adjust when you eat carbohydrates. Choose foods rich in viscous (soluble) fiber Viscous, or soluble, is  found in the walls of plant cells. Viscous fiber is found only in plant-based foods. Eating foods with fiber helps to lower your unhealthy cholesterol and keep your blood glucose in range Rich sources of viscous fiber include vegetables (asparagus, Brussels sprouts, sweet potatoes, turnips) fruit (apricots, mangoes, oranges), legumes, and whole grains (barley, oats, and oat bran). As you increase your fiber intake gradually, also increase the amount of water you drink. This will help prevent constipation. If you have difficulty achieving this goal, ask your RDN about fiber laxatives. Choose fiber supplements made with viscous fibers such as psyllium seed husks or methylcellulose to help lower unhealthy cholesterol. Limit refined carbohydrates There are three types of carbohydrates: starches, sugar, and fiber. Some carbohydrates occur naturally in food, like the starches in rice or corn or the sugars in fruits and milk. Refined carbohydrates--foods with high amounts of simple sugars--can raise triglyceride levels. High triglyceride levels are associated with coronary heart disease. Some  examples of refined carbohydrate foods are table sugar, sweets, and beverages sweetened with added sugar. Tips for Reducing Sodium (Salt) Although sodium is important for your body to function, too much sodium can be harmful for people with high blood pressure. As sodium and fluid buildup in your tissues and bloodstream, your blood pressure increases. High blood pressure may cause damage to other organs and increase your risk for a stroke. Even if you take a pill for blood pressure or a water pill (diuretic) to remove fluid, it is still important to have less salt in your diet. Ask your doctor and RDN what amount of sodium is right for you. Avoid processed foods. Eat more fresh foods. Fresh fruits and vegetables are naturally low in sodium, as well as frozen vegetables and fruits that have no added juices or sauces. Fresh meats are lower in sodium than processed meats, such as bacon, sausage, and hotdogs. Read the nutrition label or ask your butcher to help you find a fresh meat that is low in sodium. Eat less salt--at the table and when cooking. A single teaspoon of table salt has 2,300 mg of sodium. Leave the salt out of recipes for pasta, casseroles, and soups. Ask your RDN how to cook your favorite recipes without sodium Be a smart shopper. Copyright Academy of Nutrition and Dietetics. This handout may be duplicated for client education. Page 4/6 Look for food packages that say salt-free or sodium-free. These items contain less than 5 milligrams of sodium per serving. Very low-sodium products contain less than 35 milligrams of sodium per serving. Low-sodium products contain less than 140 milligrams of sodium per serving. Beware for Unsalted or No Added Salt products. These items may still be high in sodium. Check the nutrition label. Add flavors to your food without adding sodium. Try lemon juice, lime juice, fruit juice or vinegar. Dry or fresh herbs add flavor. Try basil,  bay leaf, dill, rosemary, parsley, sage, dry mustard, nutmeg, thyme, and paprika. Pepper, red pepper flakes, and cayenne pepper can add spice t your meals without adding sodium. Hot sauce contains sodium, but if you use just a drop or two, it will not add up to much. Buy a sodium-free seasoning blend or make your own at home. Additional Lifestyle Tips Achieve and maintain a healthy weight. Talk with your RDN or your doctor about what is a healthy weight for you. Set goals to reach and maintain that weight. To lose weight, reduce your calorie intake along with increasing your physical activity. A weight loss of  10 to 15 pounds could reduce LDL-cholesterol by 5 milligrams per deciliter. Participate in physical activity. Talk with your health care team to find out what types of physical activity are best for you. Set a plan to get about 30 minutes of exercise on most days. Foods Recommended Food Group Foods Recommended Grains Whole grain breads and cereals, including whole wheat, barley, rye, buckwheat, corn, teff, quinoa, millet, amaranth, brown or wild rice, sorghum, and oats Pasta, especially whole wheat or other whole grain types [employer], quinoa or wild rice Whole grain crackers, bread, r olls, pitas Home-made bread with reduced-sodium baking soda Protein Foods Lean cuts of beef and pork (loin, leg, round, extra lean hamburger) Skinless poultry Fish Veni son and other wild game Dried beans and peas Nuts and nut butters Meat alternatives mad e with soy or textured vegetable protein Egg whites or egg substitute Cold cuts made with lean me at or soy protein Copyright Academy of Nutrition and Dietetics. This handout may be duplicated for client education. Page 5/6 Dairy Nonfat (skim), low-fat, or 1%-fat milk Nonfat or low-fat yogurt or cottage ch eese Fat-free and low-fat cheese Vegetables Fresh, frozen, or canned vegetables without added fat or salt Fruits Fresh, frozen,  canned, or dried fruit Oils Unsaturated oils (corn, olive, peanut, soy, sunflower, canola) Soft or liquid margarines and vegetable oil spreads Salad dressings Seeds and nuts Avocado Foods Not Recommended Food Group Foods Not Recommended Grains Breads or crackers topped with salt Cereals (hot or cold) with more than 300 mg sodium per serving Biscuits, cornbread, and other quick breads prepared with bak ing soda Bread crumbs or stuffing mix from a store High-fat bakery products, such as doughn uts, biscuits, croissants, danish pastries, pies, cookies Instant cooking foods to which you add hot water and stir--potatoes, noodles, rice, etc. Packaged starchy foods--seasoned noodle or rice dishes, stuffing mix, macaroni and che ese dinner Snacks made with partially hydrogenated oils, including chips, cheese puffs, snack mixes, regular c rackers, butter-flavored popcorn Protein Foods Higher-fat cuts of meats (ribs, t-bone steak, regular hamburger) Bacon, sausage, or hot dogs Cold cuts, such as salami or bologna, deli meats, cured meats, corned beef Organ meats (liver, brains, gizzards, sweetbreads) Poultry with skin Fried or smoked meat, poultry, and fish Whole eggs and egg yolks (more than 2 -4 per week) Salted legumes, nuts, seeds, or nut/seed butters Meat alternatives with high levels of sodium (>30 0 mg per serving) or saturated fat (>5 g per serving) Dairy Whole milk,?2% fat milk, buttermilk Whole milk yogurt or ice cream Cream Half-&- half Cream che ese Sour cream Cheese Vegetables Canned or frozen vegetables with salt, fresh vegetables prepared with salt, butter, cheese, or cream sauce Fried vegetables Pickled vegetabl es such as olives, pickles, or sauerkraut Fruits Fried fruits Fruits serve d with butter or cream Copyright Academy of Nutrition and Dietetics. This handout may be duplicated for client education. Page 6/6 Oils Butter, stick margarine,  shortening Partially hydrogenated oils or trans fats Tropical oils (coconut, palm, palm kerne l oils) Other Candy, sugar sweetened soft drinks and desserts Salt, sea salt, garlic salt, and seasoning mixes con taining salt Bouillon cubes Ketchup, barbe cue sauce, Worcestershire sauce, soy sauce, teriyaki sauce Miso Salsa Pickle s, olives, relish Heart Healthy Consistent Carbohydrate Sample 1-Day Menu Breakfast 1 cup cooked oatmeal (2 carbohydrate servings) 3/4 cup blueberries (1 carbohydrate serving) 1 ounce almonds 1 cup skim milk (1 carbohydrate serving) 1 cup coffee Morning Snack 1 cup sugar-free nonfat yogurt (1 carbohydrate serving)  Lunch 2 slices whole-wheat bread (2 carbohydrate servings) 2 ounces lean [place] breast 1 ounce low-fat Swiss cheese 1 teaspoon mustard 1 slice tomato 1 lettuce leaf 1 small pear (1 carbohydrate serving) 1 cup skim milk (1 carbohydrate serving) Afternoon Snack 1 ounce trail mix with unsalted nuts, seeds, and raisins (1 carbohydrate serving) Evening Meal 3 ounces salmon 2/3 cup cooked brown rice (2 carbohydrate servings) 1 teaspoon soft margarine 1 cup cooked broccoli with 1/2 cup cooked carrots (1 carbohydrate serving Carrots, cooked, boiled, drained, without salt 1 cup lettuce 1 teaspoon olive oil with vinegar for dressing 1 small whole grain roll (1 carbohydrate serving) 1 teaspoon soft margarine 1 cup unsweetened tea Evening Snack 1 extra-small banana (1 carbohydrate serving)

## 2019-02-20 NOTE — Discharge Summary (Signed)
Physician Discharge Summary  Patient ID: [name] MRN: [id number] DOB/AGE: 47-Nov-1972 47 y.o.  Admit date: 02/13/2019 Discharge date: 02/20/2019  Discharge Diagnoses:  Principal Problem:   Left pontine stroke [facility]) Active Problems:   Obstructive sleep apnea   Hypokalemia   Hypomagnesemia   Diabetes mellitus type 2 in obese (HCC)   Benign essential HTN   Non compliance w medication regimen   Discharged Condition: stable   Significant Diagnostic Studies:   Labs:  Basic Metabolic Panel: BMP Latest Ref Rng & Units 02/18/2019 02/14/2019 02/12/2019  Glucose 70 - 99 mg/dL 144(H) 185(H) 260(H)  BUN 6 - 20 mg/dL _0 Creatinine 0.44 - 1.00 mg/dL 1.35(H) 1.34(H) 1.38(H)  Sodium 135 - 145 mmol/L 137 137 135  Potassium 3.5 - 5.1 mmol/L 3.9 3.6 3.2(L)  Chloride 98 - 111 mmol/L 104 100 97(L)  CO2 22 - 32 mmol/L _1 Calcium 8.9 - 10.3 mg/dL 8.8(L) 9.0 8.9    CBC: CBC Latest Ref Rng & Units 02/18/2019 02/14/2019 02/12/2019  WBC 4.0 - 10.5 K/uL 6.8 9.2 10.3  Hemoglobin 12.0 - 15.0 g/dL 12.8 13.5 13.2  Hematocrit 36.0 - 46.0 % 37.4 37.9 37.3  Platelets 150 - 400 K/uL 282 267 267    CBG: Recent Labs  Lab 02/19/19 1627 02/19/19 1732 02/19/19 2140 02/20/19 0559 02/20/19 1105  GLUCAP 122* 115* 130* 110* 64*     Brief HPI:   [name] is a 48 year old RH female with history of HTN, T2DM with neuropathy, OSA; who was admitted on 02/11/2019 with persistent right facial numbness x48 hours and malignant hypertension with BP 220/116.  Patient reported being out of meds for 2 to 3 months secondary to  fear of COVID.  She was noted to be dehydrated with serum creatinine at 1.58 and had low potassium and magnesium levels.  She was treated with IV fluids with improvement MRI brain done revealing patchy small volume nonhemorrhagic infarct left lateral midbrain/pons and small vessel disease.  2D echo showed EF of 60 to 65% with severe concentric LVH.  Dr. Leonie Man felt the stroke was  due to small vessel disease and recommended aspirin/Plavix for 3 weeks followed by aspirin alone as well as compliance with CPAP use and cessation of THC use.  Patient with resultant balance deficits with right lower extremity proprioception self-care deficits.  CIR recommended due to functional decline   Hospital Course: [name] was admitted to rehab 02/13/2019 for inpatient therapies to consist of PT, ST and OT at least three hours five days a week. Past admission physiatrist, therapy team and rehab RN have worked together to provide customized collaborative inpatient rehab.  She was maintained on aspirin and Plavix during his stay and was follow-up CBC shows H&H and platelets to be relatively stable.  Blood pressures have been monitored on twice daily basis and hydralazine was titrated upwards and metoprolol was added additionally to help with better control.  She was maintained on Lantus initially and her blood sugars have been monitored with Ac/hs cbg checks.  She was transitioned to 70/30 insulin 15 units twice daily prior to discharge.  She was advised to monitor blood sugars AC at bedtime basis and follow-up with PCP for further titration as needed Mag-Ox and low dose K dur were added for supplementation.  Follow-up labs show improvement with Mg - 1.8 and hypokalemia has resolved.  She will need repeat BMET in 1 week to monitor for need of supplement  further supplementation.  She has made good gains during her rehab stay and is currently at modified independent level.    Rehab Course: During patient's stay in rehab team conference was held to monitor patient's progress, set goals and discuss barriers to discharge. At admission, patient required min assist with basic self care tasks and with mobility. She exhibited mild word finding deficits with mild deficits in [facility] [employer] recall and in selective attention. She  has had improvement in activity tolerance, balance, postural control as well as ability to  compensate for deficits.   She is able to complete ADL tasks at modified independent level. She is modified independent for transfers and to ambulate 200' with RW.  She is able to use word finding strategies for abstract thoughts/ideas, recall and perform complex tasks at modified independent level.  No family education needed due to her overall progress.  She will continue to receive further follow-up Home health PT and OT by [facility] after discharge.     Disposition:  home   Diet: Carb Modified/Heart healthy.   Special Instructions: 1. Monitor and record BS ac/hs basis. Follow up with PCP for further adjustment.  2. Will need BMET/Magnesium rechecked in one week.      Discharge Instructions    Ambulatory referral to Physical Medicine Rehab   Complete by: As directed    1-2 weeks transitional care appt     Allergies as of 02/20/2019   No Known Allergies     Medication List    STOP taking these medications   glipiZIDE 10 MG tablet Commonly known as: GLUCOTROL   lisinopril-hydrochlorothiazide 20-12.5 MG tablet Commonly known as: ZESTORETIC     TAKE these medications   albuterol 108 (90 Base) MCG/ACT inhaler Commonly known as: VENTOLIN HFA Inhale into the lungs every 6 (six) hours as needed for wheezing or shortness of breath.   amLODipine 10 MG tablet Commonly known as: NORVASC Take 1 tablet (10 mg total) by mouth daily.   aspirin 81 MG EC tablet Take 1 tablet (81 mg total) by mouth daily.   atorvastatin 40 MG tablet Commonly known as: LIPITOR Take 1 tablet (40 mg total) by mouth daily at 6 PM.   blood glucose meter kit and supplies Kit Dispense based on patient and insurance preference. Use up to four times daily as directed. (FOR ICD-9 250.00, 250.01).   cholecalciferol 25 MCG (1000 UT) tablet Commonly known as: VITAMIN D3 Take 1,000 Units by mouth daily.   clopidogrel 75 MG tablet Commonly known as: PLAVIX Take 1 tablet (75 mg total) by mouth daily for  15 days.   hydrALAZINE 50 MG tablet Commonly known as: APRESOLINE Take 1 tablet (50 mg total) by mouth every 8 (eight) hours.   insulin aspart protamine- aspart (70-30) 100 UNIT/ML injection Commonly known as: NOVOLOG MIX 70/30 Inject 0.15 mLs (15 Units total) into the skin 2 (two) times daily with a meal. What changed: how much to take   Insulin Syringes (Disposable) V-785 1 ML Misc 1 application by Does not apply route 2 (two) times a day.   magnesium oxide 400 (241.3 Mg) MG tablet Commonly known as: MAG-OX Take 1 tablet (400 mg total) by mouth 2 (two) times daily.   metoprolol tartrate 25 MG tablet Commonly known as: LOPRESSOR Take 1 tablet (25 mg total) by mouth 2 (two) times daily.   polyethylene glycol 17 g packet Commonly known as: MIRALAX / GLYCOLAX Take 17 g by mouth 2 (two) times daily.  potassium chloride 10 MEQ tablet Commonly known as: K-DUR Take 1 tablet (10 mEq total) by mouth 2 (two) times daily.      Follow-up Information    Meredith Staggers, MD Follow up.   Specialty: Physical Medicine and Rehabilitation Why: Office will call you for follow up appointment Contact information: [address] [place] [address] [phone]        Trey Sailors, [place] Follow up on 02/27/2019.   Specialty: Physician Assistant Why: Appointment at 9;15 am for post hospital follow up/needs BMET/Mg level rechecked.  Contact information: [place] [address] [phone]           Signed: Bary Leriche 02/20/2019, 1:33 PM

## 2019-02-22 ENCOUNTER — Telehealth: Payer: Self-pay | Admitting: Registered Nurse

## 2019-02-22 NOTE — Telephone Encounter (Signed)
Transitional Care call  Patient name: SHAQUANDA GRAVES DOB: 1970/12/05 1. Are you/is patient experiencing any problems since coming home? No a. Are there any questions regarding any aspect of care? No 2. Are there any questions regarding medications administration/dosing? No a. Are meds being taken as prescribed? Yes b. "Patient should review meds with caller to confirm"  3. Have there been any falls? No 4. Has Home Health been to the house and/or have they contacted you?  a. If not, have you tried to contact them? No b. Can we help you contact them? Yes, we will call [facility]  5. Are bowels and bladder emptying properly? Yes a. Are there any unexpected incontinence issues? No b. If applicable, is patient following bowel/bladder programs? No 6. Any fevers, problems with breathing, unexpected pain? No 7. Are there any skin problems or new areas of breakdown? No 8. Has the patient/family member arranged specialty MD follow up (ie cardiology/neurology/renal/surgical/etc.)?  HFU appointments have been scheduled, she reports  a. Can we help arrange?  9. Does the patient need any other services or support that we can help arrange? No 10. Are caregivers following through as expected in assisting the patient? Yes 11. Has the patient quit smoking, drinking alcohol, or using drugs as recommended? Ms. Reeder admits she smokes marijuana, she denies smoking cigarrets or drinking alcohol. We discussed cessation she verbalizes understanding.   Appointment date/time: 03/06/2019  arrival time 11:00 for 11:20 with Bayard Hugger ANP-C. At  [place]

## 2019-02-27 ENCOUNTER — Other Ambulatory Visit: Payer: Self-pay | Admitting: Physical Medicine and Rehabilitation

## 2019-03-06 ENCOUNTER — Encounter: Payer: Medicaid Other | Attending: Registered Nurse | Admitting: Registered Nurse

## 2019-03-06 ENCOUNTER — Encounter: Payer: Self-pay | Admitting: Registered Nurse

## 2019-03-06 ENCOUNTER — Other Ambulatory Visit: Payer: Self-pay

## 2019-03-06 VITALS — BP 165/82 | HR 85 | Temp 98.1°F | Resp 12 | Ht 69.0 in | Wt 231.0 lb

## 2019-03-06 DIAGNOSIS — F1910 Other psychoactive substance abuse, uncomplicated: Secondary | ICD-10-CM | POA: Diagnosis not present

## 2019-03-06 DIAGNOSIS — I639 Cerebral infarction, unspecified: Secondary | ICD-10-CM

## 2019-03-06 DIAGNOSIS — E669 Obesity, unspecified: Secondary | ICD-10-CM | POA: Diagnosis present

## 2019-03-06 DIAGNOSIS — E1169 Type 2 diabetes mellitus with other specified complication: Secondary | ICD-10-CM | POA: Insufficient documentation

## 2019-03-06 DIAGNOSIS — I6350 Cerebral infarction due to unspecified occlusion or stenosis of unspecified cerebral artery: Secondary | ICD-10-CM | POA: Diagnosis not present

## 2019-03-06 DIAGNOSIS — I10 Essential (primary) hypertension: Secondary | ICD-10-CM | POA: Insufficient documentation

## 2019-03-06 NOTE — Progress Notes (Signed)
Subjective:    Patient ID: [name], female    DOB: 05/12/1971, 48 y.o.   MRN: [id number]  HPI: [name] is a 48 y.o. female who is here for transitional care  appointment for follow up of her left pontine stroke, essential hypertension and Typ 2 DM.  She presented to [name] on 02/11/2019 for complaints of right facial numbness, which began 48 hours prior to presenting to the emergency room. Also stated she has been non-compliant with her anti-hypertensive medications.  MRI Brain:  IMPRESSION: 1. Patchy small volume acute ischemic nonhemorrhagic infarct involving the left lateral midbrain/pons. 2. Underlying mild chronic microvascular ischemic disease. CT Angio:  IMPRESSION: 1. Diffuse distal branch vessel irregularity throughout the circle-of-Willis without a significant proximal stenosis or occlusion. 2. Atherosclerotic changes are evident within the cavernous internal carotid arteries and right greater than left M1 segments with diffuse vessel irregularity and mild narrowing of less than 50% relative to the more distal vessels. 3. Tortuosity of the cervical internal carotid arteries and vertebral arteries bilaterally without significant stenosis. This is nonspecific, but often seen in the setting of chronic hypertension. 4. Brainstem infarct and white matter parenchymal changes noted on MRI are not discernible by CT.  Neurology was consulted.   [name] was admitted to inpatient Rehabilitation on 02/13/2019 and discharged on 02/20/2019. She's receiving outpatient therapy with [facility]. She states she has pain in her lower back. She rates her pain 6. Also reports she has a good appetite.    Pain Inventory Average Pain 5 Pain Right Now 6 My pain is sharp  In the last 24 hours, has pain interfered with the following? General activity 7 Relation with others 7 Enjoyment of life 7 What TIME of day is your pain at its worst? night Sleep (in  general) Poor  Pain is worse with: walking, sitting and some activites Pain improves with: n/a Relief from Meds: 0  Mobility use a walker ability to climb steps?  no do you drive?  yes  Function not employed: date last employed 3/20  Neuro/Psych numbness tingling trouble walking depression anxiety  Prior Studies no  Physicians involved in your care no   Family History  Problem Relation Age of Onset  . Asthma Mother   . Hypertension Father   . Kidney disease Father   . Asthma Other   . Hyperlipidemia Other   . Hypertension Other   . Cancer Other   . Diabetes Maternal Grandmother    Social History   Socioeconomic History  . Marital status: Single    Spouse name: Not on file  . Number of children: Not on file  . Years of education: Not on file  . Highest education level: Not on file  Occupational History  . Not on file  Social Needs  . Financial resource strain: Not on file  . Food insecurity    Worry: Not on file    Inability: Not on file  . Transportation needs    Medical: Not on file    Non-medical: Not on file  Tobacco Use  . Smoking status: Current Every Day Smoker    Packs/day: 0.50    Types: Cigarettes  . Smokeless tobacco: Former Systems developer    Quit date: 03/31/2016  Substance and Sexual Activity  . Alcohol use: Yes    Alcohol/week: 0.0 standard drinks    Comment: Occassional   . Drug use: Yes    Types: Marijuana    Comment: Occassional   .  Sexual activity: Yes    Partners: Male    Birth control/protection: Surgical  Lifestyle  . Physical activity    Days per week: Not on file    Minutes per session: Not on file  . Stress: Not on file  Relationships  . Social Herbalist on phone: Not on file    Gets together: Not on file    Attends religious service: Not on file    Active member of club or organization: Not on file    Attends meetings of clubs or organizations: Not on file    Relationship status: Not on file  Other Topics  Concern  . Not on file  Social History Narrative  . Not on file   Past Surgical History:  Procedure Laterality Date  . ACHILLES TENDON REPAIR     Past Medical History:  Diagnosis Date  . Arthritis   . Diabetes mellitus   . Hypertension   . Left thyroid nodule    diagnosed 07/2018  . Pseudotumor cerebri    There were no vitals taken for this visit.  Opioid Risk Score:   Fall Risk Score:  `1  Depression screen PHQ 2/9  No flowsheet data found.    Review of Systems  All other systems reviewed and are negative.      Objective:   Physical Exam Constitutional:      Appearance: Normal appearance.  Neck:     Musculoskeletal: Normal range of motion and neck supple.  Cardiovascular:     Rate and Rhythm: Normal rate and regular rhythm.     Pulses: Normal pulses.     Heart sounds: Normal heart sounds.  Pulmonary:     Effort: Pulmonary effort is normal.     Breath sounds: Normal breath sounds.  Musculoskeletal:     Comments: Normal Muscle Bulk and Muscle Testing Reveals:  Upper Extremities: Full ROM and Muscle Strength 5/5  Back without spinal Tenderness noted  Lower Extremities: Full ROM and Muscle Strength 5/5 Wearing Right AFO Arises from Table slowly using walker for support Wide Based  Gait   Skin:    General: Skin is warm and dry.  Neurological:     Mental Status: She is alert and oriented to person, place, and time.           Assessment & Plan:  1. Left Pontine Stroke: Continue with outpatient therapy with [facility]. Continue to Monitor.  2. Essential Hypertension: Continue current medication regimen. PCP Following.  3. Type 2 DM: Continue current medication regimen. PCP Following.  2. Substance Abuse: Educated on cessation of THC. [name] reports she is trying to weaned her self off of marijuana.   20 minutes of face to face patient care time was spent during this visit. All questions were encouraged and answered.  Follow up with Dr.  Letta Pate in 4-6 weeks.

## 2019-03-14 ENCOUNTER — Other Ambulatory Visit: Payer: Self-pay | Admitting: Physical Medicine and Rehabilitation

## 2019-03-26 ENCOUNTER — Other Ambulatory Visit: Payer: Self-pay | Admitting: Physical Medicine and Rehabilitation

## 2019-03-30 ENCOUNTER — Other Ambulatory Visit: Payer: Self-pay | Admitting: Physical Medicine and Rehabilitation

## 2019-04-08 DIAGNOSIS — E782 Mixed hyperlipidemia: Secondary | ICD-10-CM | POA: Insufficient documentation

## 2019-04-08 DIAGNOSIS — E1165 Type 2 diabetes mellitus with hyperglycemia: Secondary | ICD-10-CM | POA: Insufficient documentation

## 2019-04-08 NOTE — Progress Notes (Signed)
Patient referred by Teresa Mccreedy, MD for hypertensive heart disease  Subjective:   [name], female    DOB: 11-Jul-1971, 48 y.o.   MRN: [id number]  Chief Complaint  Patient presents with  . Hypertension  . New Patient (Initial Visit)     HPI  48 y.o. 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  Patient was previously seen by Dr. Woody Seller in 06/2018. She underwent echocardiogram that showed severe LVH, EF 45-50%, grade II DD, severe LA dilatation, mild MR, mild TR, trace pericardial effusion. Differential for LVH included hypertrophic cardiomyopathy/infiltrative vs hypertensive heart disease.  Patient was admitted to the hospital in July 2020 with facial numbness, loss of balance, and malignant hypertension.  She was found to have left pontine stroke.  Neurology evaluated the patient and felt that stroke was due to small vessel disease and recommended aspirin and Plavix for 3 weeks, followed by aspirin alone.  Patient underwent neuro rehab course. She is now able to walk without a walker.  Residual neuro deficit includes right-sided numbness, and mild right foot drop.  Patient endorses noncompliance with her hypertension medications prior to the stroke.  However, she now is compliant with her medications.  Blood pressure still remains elevated.  Patient has strong family history of hypertension, as well as history of congestive heart failure in at least 2 family members.  Prior to the stroke, patient frequently had right arm and hand tingling.  She used to high frequency of bowel movements immediately after eating.  However, she has noted constipation since her stroke.  She still gets menstrual.'s, although they have become more irregular lately.  Past Medical History:  Diagnosis Date  . Arthritis   . Diabetes mellitus   . Hypertension    . Left thyroid nodule    diagnosed 07/2018  . Pseudotumor cerebri      Past Surgical History:  Procedure Laterality Date  . ACHILLES TENDON REPAIR       Social History   Socioeconomic History  . Marital status: Single    Spouse name: Not on file  . Number of children: Not on file  . Years of education: Not on file  . Highest education level: Not on file  Occupational History  . Not on file  Social Needs  . Financial resource strain: Not on file  . Food insecurity    Worry: Not on file    Inability: Not on file  . Transportation needs    Medical: Not on file    Non-medical: Not on file  Tobacco Use  . Smoking status: Current Every Day Smoker    Packs/day: 0.50    Types: Cigarettes  . Smokeless tobacco: Former Systems developer    Quit date: 03/31/2016  Substance and Sexual Activity  . Alcohol use: Yes    Alcohol/week: 0.0 standard drinks    Comment: Occassional   . Drug use: Yes    Types: Marijuana    Comment: Occassional   . Sexual activity: Yes    Partners: Male    Birth control/protection: Surgical  Lifestyle  . Physical activity    Days per week: Not on file    Minutes per session: Not on file  . Stress: Not on file  Relationships  . Social Herbalist on phone: Not on file    Gets together: Not on file    Attends religious service: Not  on file    Active member of club or organization: Not on file    Attends meetings of clubs or organizations: Not on file    Relationship status: Not on file  . Intimate partner violence    Fear of current or ex partner: Not on file    Emotionally abused: Not on file    Physically abused: Not on file    Forced sexual activity: Not on file  Other Topics Concern  . Not on file  Social History Narrative  . Not on file     Family History  Problem Relation Age of Onset  . Asthma Mother   . Hypertension Father   . Kidney disease Father   . Asthma Other   . Hyperlipidemia Other   . Hypertension Other   . Cancer  Other   . Diabetes Maternal Grandmother      Current Outpatient Medications on File Prior to Visit  Medication Sig Dispense Refill  . albuterol (VENTOLIN HFA) 108 (90 Base) MCG/ACT inhaler Inhale into the lungs every 6 (six) hours as needed for wheezing or shortness of breath.    Marland Kitchen amLODipine (NORVASC) 10 MG tablet Take 1 tablet (10 mg total) by mouth daily. 30 tablet 1  . aspirin EC 81 MG EC tablet Take 1 tablet (81 mg total) by mouth daily.    Marland Kitchen atorvastatin (LIPITOR) 40 MG tablet Take 1 tablet (40 mg total) by mouth daily at 6 PM. 30 tablet 1  . blood glucose meter kit and supplies KIT Dispense based on patient and insurance preference. Use up to four times daily as directed. (FOR ICD-9 250.00, 250.01). 1 each 0  . cholecalciferol (VITAMIN D3) 25 MCG (1000 UT) tablet Take 1,000 Units by mouth daily.    . hydrALAZINE (APRESOLINE) 50 MG tablet Take 1 tablet (50 mg total) by mouth every 8 (eight) hours. 90 tablet 1  . insulin aspart protamine- aspart (NOVOLOG MIX 70/30) (70-30) 100 UNIT/ML injection Inject 0.15 mLs (15 Units total) into the skin 2 (two) times daily with a meal. 10 mL 11  . Insulin Syringes, Disposable, S-854 1 ML MISC 1 application by Does not apply route 2 (two) times a day. 100 each 0  . magnesium oxide (MAG-OX) 400 (241.3 Mg) MG tablet Take 1 tablet (400 mg total) by mouth 2 (two) times daily. 60 tablet 1  . metoprolol tartrate (LOPRESSOR) 25 MG tablet Take 1 tablet (25 mg total) by mouth 2 (two) times daily. 60 tablet 1  . polyethylene glycol (MIRALAX / GLYCOLAX) 17 g packet Take 17 g by mouth 2 (two) times daily. 60 each 0  . potassium chloride (K-DUR) 10 MEQ tablet Take 1 tablet (10 mEq total) by mouth 2 (two) times daily. 60 tablet 1   No current facility-administered medications on file prior to visit.     Cardiovascular studies:  EKG 04/09/2019:  Sinus rhythm 76 bpm. Left atrial enlargement.  Possible old anteroseptal infarct. Poor R-wave progression.  Inferolateral T wave inversion, possible ischemia.  CTA head/neck 02/12/2019: 1. Diffuse distal branch vessel irregularity throughout the circle-of-Willis without a significant proximal stenosis or occlusion. 2. Atherosclerotic changes are evident within the cavernous internal carotid arteries and right greater than left M1 segments with diffuse vessel irregularity and mild narrowing of less than 50% relative to the more distal vessels. 3. Tortuosity of the cervical internal carotid arteries and vertebral arteries bilaterally without significant stenosis. This is nonspecific, but often seen in the setting of chronic hypertension. 4.  Brainstem infarct and white matter parenchymal changes noted on MRI are not discernible by CT.  Echocardiogram 02/12/2019:  1. The left ventricle has normal systolic function with an ejection fraction of 60-65%. The cavity size was normal. There is severe concentric left ventricular hypertrophy. Left ventricular diastolic Doppler parameters are consistent with impaired  relaxation.  2. The right ventricle has normal systolic function. The cavity was normal. There is no increase in right ventricular wall thickness.  3. Small pericardial effusion.  4. The mitral valve is abnormal. Mild thickening of the mitral valve leaflet. No evidence of mitral valve stenosis.  5. The aortic valve is grossly normal. Mild thickening of the aortic valve. Mild calcification of the aortic valve. Aortic valve regurgitation is mild by color flow Doppler. No stenosis of the aortic valve.  6. There is a small area of calcification on the [place].  7. The aortic root and ascending aorta are normal in size and structure.  8. The interatrial septum was not assessed.  MRI brain 02/11/2019: 1. Patchy small volume acute ischemic nonhemorrhagic infarct involving the left lateral midbrain/pons. 2. Underlying mild chronic microvascular ischemic disease.  Echocardiogram 07/20/2018: Left ventricle  cavity is normal in size. Severe concentric hypertrophy of the left ventricle measuring 2.2 cm. Normal global wall motion. Doppler evidence of grade II (pseudonormal) diastolic dysfunction. Diastolic dysfunction findings suggests elevated LA/LV end diastolic pressure. Visual EF 45-50%. Calculated LVEF 55%. Consider hypertrophic cardiomyopathy/infiltrative vs hypertensive heart disease. Left atrial cavity is severely dilated at 5 cm. Trileaflet aortic valve with mild (Grade I) regurgitation. Mild aortic valve leaflet thickening. Mild (Grade I) mitral regurgitation. Insignificant pericardial effusion.  Recent labs: 03/12/2019: Glucose 170.  BUN/creatinine 26/1.3.  EGFR 57.  NA/K1 39/4.2. H/H 12.6/37.5.  MCV 91.  Platelets 245.  Normal iron studies. Hemoglobin A1c 8.5%. Cholesterol 106, triglycerides 103, HDL 38, LDL 47   Review of Systems  Constitution: Negative for decreased appetite, malaise/fatigue, weight gain and weight loss.  HENT: Negative for congestion.   Eyes: Negative for visual disturbance.  Cardiovascular: Negative for chest pain, dyspnea on exertion, leg swelling, palpitations and syncope.  Respiratory: Negative for cough.   Endocrine: Negative for cold intolerance.  Hematologic/Lymphatic: Does not bruise/bleed easily.  Skin: Negative for itching and rash.  Musculoskeletal: Negative for myalgias.  Gastrointestinal: Negative for abdominal pain, nausea and vomiting.  Genitourinary: Negative for dysuria.  Neurological: Positive for numbness. Negative for dizziness and weakness.       Foot drop  Psychiatric/Behavioral: The patient is not nervous/anxious.   All other systems reviewed and are negative.        Vitals:   04/09/19 0936  BP: (!) 170/101  Pulse: 83  Temp: 97.7 F (36.5 C)  SpO2: 98%     Body mass index is 34.56 kg/m. Filed Weights   04/09/19 0936  Weight: 106.1 kg     Objective:   Physical Exam  Constitutional: She is oriented to person,  place, and time. She appears well-developed and well-nourished. No distress.  HENT:  Head: Normocephalic and atraumatic.  Eyes: Pupils are equal, round, and reactive to light. Conjunctivae are normal.  Neck: No JVD present.  Cardiovascular: Normal rate, regular rhythm and intact distal pulses.  Murmur heard.  Early systolic murmur is present with a grade of 2/6 at the lower right sternal border. Pulmonary/Chest: Effort normal and breath sounds normal. She has no wheezes. She has no rales.  Abdominal: Soft. Bowel sounds are normal. There is no rebound.  Musculoskeletal:  General: No edema.  Lymphadenopathy:    She has no cervical adenopathy.  Neurological: She is alert and oriented to person, place, and time. No cranial nerve deficit.  Skin: Skin is warm and dry.  Psychiatric: She has a normal mood and affect.  Nursing note and vitals reviewed.         Assessment & Recommendations:   48 y.o. 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  Hypertension: Uncontrolled.  Strong family history of hypertension. Recommend stopping metoprolol tartrate 25 mg twice daily.  Started carvedilol 6.25 mg twice daily. Continue rest of the antihypertensive therapy, including amlodipine, hydralazine, lisinopril-HCTZ.  Left ventricular hypertrophy: Severe.  Seen on 2 separate echocardiograms and 2019 and 2020.  While hypertensive cardiomyopathy is possible, given patient's history of malignant hypertension, her EKG QRS amplitude is underwhelming to suggest hypertensive cardiomyopathy.  Given family history of congestive heart failure in an African-American patient, amyloidosis is certainly in the differential.  Fortunately, patient does not have any signs or symptoms of heart failure at this time.  Nonetheless, she will benefit from work-up for hATTR.  If  positive, she will benefit from therapy to reduce disease progression.  I will obtain work-up down the road, once blood pressure is better controlled.  This will include genetic testing, serum and urine immunofixation studies, and PYP nuclear scan to look for hATTR.  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  She has completed aspirin and Plavix for 3 weeks after the stroke.  Continue aspirin and statin.  Continue management of risk factors, including blood pressure and diabetes control, as well as statin use.  Type 2 diabetes mellitus: Uncontrolled.  Management as per PCP.  I will see her back in 4 weeks.  Total time spent with patient was 40 minutes and greater than 50% of that time was spent in counseling and coordination care with the patient regarding complex decision making and discussion as state above.   Nigel Mormon, MD [facility] [place] Pager: [phone] Office: [phone] If no answer Cell [phone]

## 2019-04-09 ENCOUNTER — Ambulatory Visit: Payer: Medicaid Other | Admitting: Cardiology

## 2019-04-09 ENCOUNTER — Other Ambulatory Visit: Payer: Self-pay

## 2019-04-09 ENCOUNTER — Encounter: Payer: Self-pay | Admitting: Cardiology

## 2019-04-09 VITALS — BP 174/112 | HR 83 | Temp 97.7°F | Ht 69.0 in | Wt 234.0 lb

## 2019-04-09 DIAGNOSIS — I10 Essential (primary) hypertension: Secondary | ICD-10-CM

## 2019-04-09 DIAGNOSIS — E1165 Type 2 diabetes mellitus with hyperglycemia: Secondary | ICD-10-CM | POA: Diagnosis not present

## 2019-04-09 DIAGNOSIS — E782 Mixed hyperlipidemia: Secondary | ICD-10-CM

## 2019-04-09 DIAGNOSIS — I517 Cardiomegaly: Secondary | ICD-10-CM

## 2019-04-09 DIAGNOSIS — Z8673 Personal history of transient ischemic attack (TIA), and cerebral infarction without residual deficits: Secondary | ICD-10-CM | POA: Diagnosis not present

## 2019-04-09 MED ORDER — CARVEDILOL 6.25 MG PO TABS: 6.2500 mg | ORAL_TABLET | Freq: Two times a day (BID) | ORAL | 3 refills | Status: DC

## 2019-04-19 ENCOUNTER — Other Ambulatory Visit: Payer: Self-pay | Admitting: Physical Medicine and Rehabilitation

## 2019-04-25 ENCOUNTER — Other Ambulatory Visit: Payer: Self-pay

## 2019-04-25 ENCOUNTER — Encounter: Payer: Medicaid Other | Attending: Registered Nurse | Admitting: Physical Medicine & Rehabilitation

## 2019-04-25 ENCOUNTER — Encounter: Payer: Self-pay | Admitting: Physical Medicine & Rehabilitation

## 2019-04-25 VITALS — BP 198/100 | HR 81 | Temp 97.7°F | Ht 69.0 in | Wt 240.8 lb

## 2019-04-25 DIAGNOSIS — I10 Essential (primary) hypertension: Secondary | ICD-10-CM | POA: Diagnosis present

## 2019-04-25 DIAGNOSIS — I6350 Cerebral infarction due to unspecified occlusion or stenosis of unspecified cerebral artery: Secondary | ICD-10-CM | POA: Diagnosis not present

## 2019-04-25 DIAGNOSIS — I639 Cerebral infarction, unspecified: Secondary | ICD-10-CM

## 2019-04-25 DIAGNOSIS — E1169 Type 2 diabetes mellitus with other specified complication: Secondary | ICD-10-CM | POA: Insufficient documentation

## 2019-04-25 DIAGNOSIS — F1910 Other psychoactive substance abuse, uncomplicated: Secondary | ICD-10-CM | POA: Diagnosis present

## 2019-04-25 DIAGNOSIS — E669 Obesity, unspecified: Secondary | ICD-10-CM | POA: Insufficient documentation

## 2019-04-25 NOTE — Patient Instructions (Addendum)
Achilles Tendinitis Rehab Ask your health care provider which exercises are safe for you. Do exercises exactly as told by your health care provider and adjust them as directed. It is normal to feel mild stretching, pulling, tightness, or discomfort as you do these exercises. Stop right away if you feel sudden pain or your pain gets worse. Do not begin these exercises until told by your health care provider. Stretching and range-of-motion exercises These exercises warm up your muscles and joints and improve the movement and flexibility of your ankle. These exercises also help to relieve pain. Standing wall calf stretch with straight knee  1. Stand with your hands against a wall. 2. Extend your left / right leg behind you, and bend your front knee slightly. Keep both of your heels on the floor. 3. Point the toes of your back foot slightly inward. 4. Keeping your heels on the floor and your back knee straight, shift your weight toward the wall. Do not allow your back to arch. You should feel a gentle stretch in your upper calf. 5. Hold this position for __________ seconds. Repeat __________ times. Complete this exercise __________ times a day. Standing wall calf stretch with bent knee 1. Stand with your hands against a wall. 2. Extend your left / right leg behind you, and bend your front knee slightly. Keep both of your heels on the floor. 3. Point the toes of your back foot slightly inward. 4. Keeping your heels on the floor, bend your back knee slightly. You should feel a gentle stretch deep in your lower calf near your heel. 5. Hold this position for __________ seconds. Repeat __________ times. Complete this exercise __________ times a day. Strengthening exercises These exercises build strength and control of your ankle. Endurance is the ability to use your muscles for a long time, even after they get tired. Plantar flexion with band In this exercise, you push your toes downward, away from you,  with an exercise band providing resistance. 1. Sit on the floor with your left / right leg extended. You may put a pillow under your calf to give your foot more room to move. 2. Loop a rubber exercise band or tube around the ball of your left / right foot. The ball of your foot is on the walking surface, right under your toes. The band or tube should be slightly tense when your foot is relaxed. If the band or tube slips, you can put on your shoe or put a washcloth between the band and your foot to help it stay in place. 3. Slowly point your toes downward, pushing them away from you (plantar flexion). 4. Hold this position for __________ seconds. 5. Slowly release the tension in the band or tube, controlling smoothly until your foot is back to the starting position. 6. Repeat steps 1-5 with your left / right leg. Repeat __________ times. Complete this exercise __________ times a day. Eccentric heel drop  In this exercise, you stand and slowly raise your heel and then slowly lower it. This exercise lengthens the calf muscles (eccentric) while the heel bears weight. If this exercise is too easy, try doing it while wearing a backpack with weights in it. 1. Stand on a step with the balls of your feet. The ball of your foot is on the walking surface, right under your toes. ? Do not put your heels on the step. ? For balance, rest your hands on the wall or on a railing. 2. Rise up onto   the balls of your feet. 3. Keeping your heels up, shift all of your weight to your left / right leg and pick up your other leg. 4. Slowly lower your left / right leg so your heel drops below the level of the step. 5. Put down your other foot before returning to the start position. If told by your health care provider, build up to: ? 3 sets of 15 repetitions while keeping your knees straight. ? 3 sets of 15 repetitions while keeping your knees slightly bent as far as told by your health care provider. Repeat __________  times. Complete this exercise __________ times a day. Balance exercises These exercises improve or maintain your balance. Balance is important in preventing falls. Single leg stand If this exercise is too easy, you can try it with your eyes closed or while standing on a pillow. 1. Without shoes, stand near a railing or in a door frame. Hold on to the railing or door frame as needed. 2. Stand on your left / right foot. Keep your big toe down on the floor and try to keep your arch lifted. 3. Hold this position for __________ seconds. Repeat __________ times. Complete this exercise __________ times a day. This information is not intended to replace advice given to you by your health care provider. Make sure you discuss any questions you have with your health care provider. Document Released: 02/23/2005 Document Revised: 11/12/2018 Document Reviewed: 05/07/2018 [employer] Patient Education  2020 [employer]  

## 2019-04-25 NOTE — Progress Notes (Signed)
Subjective:  48 year old RH female with history of HTN, T2DM with neuropathy, OSA; who was admitted on 02/11/2019 with persistent right facial numbness x48 hours and malignant hypertension with BP 220/116.  Patient reported being out of meds for 2 to 3 months secondary to  fear of COVID.  She was noted to be dehydrated with serum creatinine at 1.58 and had low potassium and magnesium levels.  She was treated with IV fluids with improvement MRI brain done revealing patchy small volume nonhemorrhagic infarct left lateral midbrain/pons and small vessel disease.  2D echo showed EF of 60 to 65% with severe concentric LVH.  Dr. Leonie Man felt the stroke was due to small vessel disease and recommended aspirin/Plavix for 3 weeks followed by aspirin alone as well as compliance with CPAP use and cessation of THC use  Patient ID: [name], female    DOB: 10/03/1970, 48 y.o.   MRN: [id number]  HPI   Stopped cigarettes ~1 yr ago, no ETOH, still with daily use of marijuana  90-95% normal, still numb on RIght side Walking without device, uses an AFO when outside the home  Gets off balance when rushing No falls , some near falls  Mod I dressing and bathing , shower chair  Pt drives locally but doesn't like to.    Still feels different sensations on the right side compared to the left side.  The patient has followed up with her primary care physician. Pain Inventory Average Pain 0 Pain Right Now 0 My pain is pulling  In the last 24 hours, has pain interfered with the following? General activity 0 Relation with others 0 Enjoyment of life 0 What TIME of day is your pain at its worst? na Sleep (in general) Poor  Pain is worse with: walking Pain improves with: na Relief from Meds: na  Mobility ability to climb steps?  no do you drive?  yes  Function not employed: date last employed na  Neuro/Psych weakness numbness tingling  Prior Studies Any changes since last visit?  no   Physicians involved in your care Any changes since last visit?  no   Family History  Problem Relation Age of Onset  . Asthma Mother   . Hypertension Father   . Kidney disease Father   . Asthma Other   . Hyperlipidemia Other   . Hypertension Other   . Cancer Other   . Diabetes Maternal Grandmother   . Congestive Heart Failure Maternal Grandmother   . Hypertension Sister   . Asthma Sister   . Congestive Heart Failure Maternal Aunt    Social History   Socioeconomic History  . Marital status: Single    Spouse name: Not on file  . Number of children: 4  . Years of education: Not on file  . Highest education level: Not on file  Occupational History  . Not on file  Social Needs  . Financial resource strain: Not on file  . Food insecurity    Worry: Not on file    Inability: Not on file  . Transportation needs    Medical: Not on file    Non-medical: Not on file  Tobacco Use  . Smoking status: Former Smoker    Packs/day: 0.50    Years: 30.00    Pack years: 15.00    Types: Cigarettes    Quit date: 2017    Years since quitting: 3.7  . Smokeless tobacco: Former Systems developer    Quit date: 03/31/2016  Substance  and Sexual Activity  . Alcohol use: Yes    Alcohol/week: 0.0 standard drinks    Comment: Occassional   . Drug use: Yes    Types: Marijuana    Comment: Occassional   . Sexual activity: Yes    Partners: Male    Birth control/protection: Surgical  Lifestyle  . Physical activity    Days per week: Not on file    Minutes per session: Not on file  . Stress: Not on file  Relationships  . Social Herbalist on phone: Not on file    Gets together: Not on file    Attends religious service: Not on file    Active member of club or organization: Not on file    Attends meetings of clubs or organizations: Not on file    Relationship status: Not on file  Other Topics Concern  . Not on file  Social History Narrative  . Not on file   Past Surgical History:  Procedure  Laterality Date  . ACHILLES TENDON REPAIR     Past Medical History:  Diagnosis Date  . Arthritis   . Asthma   . Coronary artery disease   . Diabetes mellitus   . Hyperlipidemia   . Hypertension   . Left thyroid nodule    diagnosed 07/2018  . Pseudotumor cerebri   . Stroke ([place])    BP (!) 198/100   Pulse 81   Temp 97.7 F (36.5 C)   Ht 5\' 9"  (1.753 m)   Wt 240 lb 12.8 oz (109.2 kg)   SpO2 98%   BMI 35.56 kg/m   Opioid Risk Score:   Fall Risk Score:  `1  Depression screen PHQ 2/9  Depression screen PHQ 2/9 03/06/2019  Decreased Interest 2  Down, Depressed, Hopeless 3  PHQ - 2 Score 5  Altered sleeping 3  Tired, decreased energy 2  Change in appetite 2  Feeling bad or failure about yourself  2  Trouble concentrating 2  Moving slowly or fidgety/restless 1  Suicidal thoughts 0  PHQ-9 Score 17     Review of Systems  Constitutional: Negative.   HENT: Negative.   Eyes: Negative.   Respiratory: Negative.   Cardiovascular: Negative.   Gastrointestinal: Positive for constipation.  Endocrine: Negative.   Genitourinary: Negative.   Musculoskeletal: Negative.   Skin: Negative.   Allergic/Immunologic: Negative.   Neurological: Positive for weakness and numbness.  Hematological: Negative.   Psychiatric/Behavioral: Negative.   All other systems reviewed and are negative.      Objective:   Physical Exam Vitals signs and nursing note reviewed.  Constitutional:      Appearance: Normal appearance.  HENT:     Head: Normocephalic and atraumatic.  Eyes:     Extraocular Movements: Extraocular movements intact.     Conjunctiva/sclera: Conjunctivae normal.     Pupils: Pupils are equal, round, and reactive to light.  Pulmonary:     Effort: No respiratory distress.     Breath sounds: No stridor.  Skin:    General: Skin is warm and dry.  Neurological:     General: No focal deficit present.     Mental Status: She is alert.  Psychiatric:        Mood and Affect:  Mood normal.        Behavior: Behavior normal.   Motor strength is 5/5 bilateral deltoid bicep tricep grip hip flexor knee extensor 4/5 right ankle dorsiflexor 5/5 left ankle dorsiflexor Sensation intact light touch  bilateral upper and lower limb Gait without assistive device she uses AFO she is slower in advancing the right lower extremity but no evidence of toe drag or knee instability.  No abnormal tone noted in the right upper or lower limb Extraocular muscles intact Feels some pulling on the back of the heel when her braces on     Assessment & Plan:  1.  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  She has residual mild right foot drop but manages this with AFO only for longer distances.  She still uses a shower chair for bathing but otherwise independent. I do not think any further PT OT is needed at this time. We discussed limiting marijuana use.  The exact "safe amount" has not been established She needs to follow-up with her primary physician on her hypertension.  She does state she took her blood pressure medications this morning. She has a cardiology follow-up next month Physical medicine rehab follow-up on PRN basis Achilles stretching exercises given

## 2019-05-09 ENCOUNTER — Other Ambulatory Visit: Payer: Self-pay | Admitting: Physical Medicine and Rehabilitation

## 2019-05-10 ENCOUNTER — Ambulatory Visit: Payer: Medicaid Other | Admitting: Cardiology

## 2019-05-23 ENCOUNTER — Other Ambulatory Visit: Payer: Self-pay | Admitting: Nephrology

## 2019-05-23 DIAGNOSIS — N1832 Chronic kidney disease, stage 3b: Secondary | ICD-10-CM

## 2019-05-24 ENCOUNTER — Ambulatory Visit (INDEPENDENT_AMBULATORY_CARE_PROVIDER_SITE_OTHER): Payer: Medicaid Other | Admitting: Cardiology

## 2019-05-24 ENCOUNTER — Encounter: Payer: Self-pay | Admitting: Cardiology

## 2019-05-24 ENCOUNTER — Ambulatory Visit: Payer: Medicaid Other | Admitting: Cardiology

## 2019-05-24 ENCOUNTER — Other Ambulatory Visit: Payer: Self-pay

## 2019-05-24 VITALS — BP 162/93 | HR 80 | Temp 95.4°F | Ht 69.0 in | Wt 238.0 lb

## 2019-05-24 DIAGNOSIS — I517 Cardiomegaly: Secondary | ICD-10-CM

## 2019-05-24 DIAGNOSIS — Z8673 Personal history of transient ischemic attack (TIA), and cerebral infarction without residual deficits: Secondary | ICD-10-CM

## 2019-05-24 DIAGNOSIS — E782 Mixed hyperlipidemia: Secondary | ICD-10-CM

## 2019-05-24 DIAGNOSIS — E1165 Type 2 diabetes mellitus with hyperglycemia: Secondary | ICD-10-CM | POA: Diagnosis not present

## 2019-05-24 DIAGNOSIS — I10 Essential (primary) hypertension: Secondary | ICD-10-CM

## 2019-05-24 MED ORDER — BIDIL 20-37.5 MG PO TABS: 1.0000 | ORAL_TABLET | Freq: Three times a day (TID) | ORAL | 1 refills | Status: DC

## 2019-05-24 NOTE — Progress Notes (Signed)
Patient referred by [name], MD for hypertensive heart disease  Subjective:   [name], female    DOB: August 23, 1970, 48 y.o.   MRN: [id number]  Chief Complaint  Patient presents with  . Hypertension  . Follow-up    4wk     HPI  48 y.o. 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  At last visit, I swtiched metoprolol tartarate to carvedilol 6.25 mg bid for better blood pressure control, and conitnues rest of her antihypertensive therapy. Given patient's severe left ventricular hypertrophy with relatively small QRS amplitude on EKG, and express concerns regarding amyloidosis and this middle-aged African-American patient with family history of congestive heart failure.  I planned on performing amyloidosis work-up after stabilizing her hypertension.  Blood pressure still remains uncontrolled. Recently carvedilol was increased to 2 tabs of 6.25 mg bid by her PCP.   On a separate note, she reports having memory issues since stroke.    Past Medical History:  Diagnosis Date  . Arthritis   . Asthma   . Coronary artery disease   . Diabetes mellitus   . Hyperlipidemia   . Hypertension   . Left thyroid nodule    diagnosed 07/2018  . Pseudotumor cerebri   . Stroke [facility])      Past Surgical History:  Procedure Laterality Date  . ACHILLES TENDON REPAIR       Social History   Socioeconomic History  . Marital status: Single    Spouse name: Not on file  . Number of children: 4  . Years of education: Not on file  . Highest education level: Not on file  Occupational History  . Not on file  Social Needs  . Financial resource strain: Not on file  . Food insecurity    Worry: Not on file    Inability: Not on file  . Transportation needs    Medical: Not on file    Non-medical: Not on file  Tobacco Use  . Smoking  status: Former Smoker    Packs/day: 0.50    Years: 30.00    Pack years: 15.00    Types: Cigarettes    Quit date: 2017    Years since quitting: 3.7  . Smokeless tobacco: Former Systems developer    Quit date: 03/31/2016  Substance and Sexual Activity  . Alcohol use: Yes    Alcohol/week: 0.0 standard drinks    Comment: Occassional   . Drug use: Yes    Types: Marijuana    Comment: Occassional   . Sexual activity: Yes    Partners: Male    Birth control/protection: Surgical  Lifestyle  . Physical activity    Days per week: Not on file    Minutes per session: Not on file  . Stress: Not on file  Relationships  . Social Herbalist on phone: Not on file    Gets together: Not on file    Attends religious service: Not on file    Active member of club or organization: Not on file    Attends meetings of clubs or organizations: Not on file    Relationship status: Not on file  . Intimate partner violence    Fear of current or ex partner: Not on file    Emotionally abused: Not on file    Physically abused: Not on file    Forced sexual activity: Not on file  Other  Topics Concern  . Not on file  Social History Narrative  . Not on file     Family History  Problem Relation Age of Onset  . Asthma Mother   . Hypertension Father   . Kidney disease Father   . Asthma Other   . Hyperlipidemia Other   . Hypertension Other   . Cancer Other   . Diabetes Maternal Grandmother   . Congestive Heart Failure Maternal Grandmother   . Hypertension Sister   . Asthma Sister   . Congestive Heart Failure Maternal Aunt      Current Outpatient Medications on File Prior to Visit  Medication Sig Dispense Refill  . albuterol (VENTOLIN HFA) 108 (90 Base) MCG/ACT inhaler Inhale into the lungs every 6 (six) hours as needed for wheezing or shortness of breath.    Marland Kitchen amLODipine (NORVASC) 10 MG tablet Take 1 tablet (10 mg total) by mouth daily. 30 tablet 1  . aspirin EC 81 MG EC tablet Take 1 tablet (81 mg  total) by mouth daily.    . carvedilol (COREG) 6.25 MG tablet Take 1 tablet (6.25 mg total) by mouth 2 (two) times daily. 60 tablet 3  . cholecalciferol (VITAMIN D3) 25 MCG (1000 UT) tablet Take 1,000 Units by mouth daily.    . furosemide (LASIX) 20 MG tablet Take 20 mg by mouth 2 (two) times daily.    Marland Kitchen glipiZIDE (GLUCOTROL) 10 MG tablet Take 10 mg by mouth daily.    . insulin aspart protamine- aspart (NOVOLOG MIX 70/30) (70-30) 100 UNIT/ML injection Inject 0.15 mLs (15 Units total) into the skin 2 (two) times daily with a meal. (Patient taking differently: Inject into the skin. 15 units in the am,17 in the evening) 10 mL 11  . lisinopril-hydrochlorothiazide (ZESTORETIC) 20-12.5 MG tablet Take 2 tablets by mouth daily.    . magnesium oxide (MAG-OX) 400 (241.3 Mg) MG tablet Take 1 tablet (400 mg total) by mouth 2 (two) times daily. 60 tablet 1  . metFORMIN (GLUCOPHAGE) 1000 MG tablet Take 1,000 mg by mouth 2 (two) times daily.    . polyethylene glycol (MIRALAX / GLYCOLAX) 17 g packet Take 17 g by mouth 2 (two) times daily. (Patient taking differently: Take 17 g by mouth as needed. ) 60 each 0  . blood glucose meter kit and supplies KIT Dispense based on patient and insurance preference. Use up to four times daily as directed. (FOR ICD-9 250.00, 250.01). 1 each 0  . Insulin Syringes, Disposable, J-497 1 ML MISC 1 application by Does not apply route 2 (two) times a day. 100 each 0   No current facility-administered medications on file prior to visit.     Cardiovascular studies:  EKG 04/09/2019:  Sinus rhythm 76 bpm. Left atrial enlargement.  Possible old anteroseptal infarct. Poor R-wave progression. Inferolateral T wave inversion, possible ischemia.  CTA head/neck 02/12/2019: 1. Diffuse distal branch vessel irregularity throughout the [place] without a significant proximal stenosis or occlusion. 2. Atherosclerotic changes are evident within the cavernous internal carotid arteries  and right greater than left M1 segments with diffuse vessel irregularity and mild narrowing of less than 50% relative to the more distal vessels. 3. Tortuosity of the cervical internal carotid arteries and vertebral arteries bilaterally without significant stenosis. This is nonspecific, but often seen in the setting of chronic hypertension. 4. Brainstem infarct and white matter parenchymal changes noted on MRI are not discernible by CT.  Echocardiogram 02/12/2019:  1. The left ventricle has normal systolic function with  an ejection fraction of 60-65%. The cavity size was normal. There is severe concentric left ventricular hypertrophy. Left ventricular diastolic Doppler parameters are consistent with impaired  relaxation.  2. The right ventricle has normal systolic function. The cavity was normal. There is no increase in right ventricular wall thickness.  3. Small pericardial effusion.  4. The mitral valve is abnormal. Mild thickening of the mitral valve leaflet. No evidence of mitral valve stenosis.  5. The aortic valve is grossly normal. Mild thickening of the aortic valve. Mild calcification of the aortic valve. Aortic valve regurgitation is mild by color flow Doppler. No stenosis of the aortic valve.  6. There is a small area of calcification on the [place].  7. The aortic root and ascending aorta are normal in size and structure.  8. The interatrial septum was not assessed.  MRI brain 02/11/2019: 1. Patchy small volume acute ischemic nonhemorrhagic infarct involving the left lateral midbrain/pons. 2. Underlying mild chronic microvascular ischemic disease.  Echocardiogram 07/20/2018: Left ventricle cavity is normal in size. Severe concentric hypertrophy of the left ventricle measuring 2.2 cm. Normal global wall motion. Doppler evidence of grade II (pseudonormal) diastolic dysfunction. Diastolic dysfunction findings suggests elevated LA/LV end diastolic pressure. Visual EF 45-50%. Calculated LVEF  55%. Consider hypertrophic cardiomyopathy/infiltrative vs hypertensive heart disease. Left atrial cavity is severely dilated at 5 cm. Trileaflet aortic valve with mild (Grade I) regurgitation. Mild aortic valve leaflet thickening. Mild (Grade I) mitral regurgitation. Insignificant pericardial effusion.  Recent labs: 03/12/2019: Glucose 170.  BUN/creatinine 26/1.3.  EGFR 57.  NA/K1 39/4.2. H/H 12.6/37.5.  MCV 91.  Platelets 245.  Normal iron studies. Hemoglobin A1c 8.5%. Cholesterol 106, triglycerides 103, HDL 38, LDL 47   Review of Systems  Constitution: Negative for decreased appetite, malaise/fatigue, weight gain and weight loss.  HENT: Negative for congestion.   Eyes: Negative for visual disturbance.  Cardiovascular: Negative for chest pain, dyspnea on exertion, leg swelling, palpitations and syncope.  Respiratory: Negative for cough.   Endocrine: Negative for cold intolerance.  Hematologic/Lymphatic: Does not bruise/bleed easily.  Skin: Negative for itching and rash.  Musculoskeletal: Negative for myalgias.  Gastrointestinal: Negative for abdominal pain, nausea and vomiting.  Genitourinary: Negative for dysuria.  Neurological: Positive for numbness. Negative for dizziness and weakness.       Foot drop  Psychiatric/Behavioral: The patient is not nervous/anxious.   All other systems reviewed and are negative.        Vitals:   05/24/19 1257 05/24/19 1315  BP: (!) 187/102 (!) 162/93  Pulse: 78 80  Temp: (!) 95.4 F (35.2 C)   SpO2: 98%      Body mass index is 35.15 kg/m. Filed Weights   05/24/19 1257  Weight: 238 lb (108 kg)     Objective:   Physical Exam  Constitutional: She is oriented to person, place, and time. She appears well-developed and well-nourished. No distress.  HENT:  Head: Normocephalic and atraumatic.  Eyes: Pupils are equal, round, and reactive to light. Conjunctivae are normal.  Neck: No JVD present.  Cardiovascular: Normal rate, regular  rhythm and intact distal pulses.  Murmur heard.  Early systolic murmur is present with a grade of 2/6 at the lower right sternal border. Pulmonary/Chest: Effort normal and breath sounds normal. She has no wheezes. She has no rales.  Abdominal: Soft. Bowel sounds are normal. There is no rebound.  Musculoskeletal:        General: No edema.  Lymphadenopathy:    She has no cervical adenopathy.  Neurological: She is alert and oriented to person, place, and time. No cranial nerve deficit.  Skin: Skin is warm and dry.  Psychiatric: She has a normal mood and affect.  Nursing note and vitals reviewed.         Assessment & Recommendations:   48 y.o. 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  Hypertension: Uncontrolled.  Strong family history of hypertension. Started Bidil 20-37.5 mg 1 tab bid.  Continue carvedilol 12.5 mg twice daily, amlodipine, hydralazine, lisinopril-HCTZ.  Left ventricular hypertrophy: Severe.  Seen on 2 separate echocardiograms and 2019 and 2020.  While hypertensive cardiomyopathy is possible, given patient's history of malignant hypertension, her EKG QRS amplitude is underwhelming to suggest hypertensive cardiomyopathy.  Given family history of congestive heart failure in an African-American patient, amyloidosis is certainly in the differential.  Fortunately, patient does not have any signs or symptoms of heart failure at this time.  Nonetheless, she will benefit from work-up for hATTR.  If positive, she will benefit from therapy to reduce disease progression.  I will obtain genetic testing today for hATTR, followed by serum and urine immunofixation studies, and PYP nuclear scan in the future.   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  She has completed aspirin and  Plavix for 3 weeks after the stroke.  Continue aspirin and statin.  Continue management of risk factors, including blood pressure and diabetes control, as well as statin use. I have encouraged her to speak with her neurologist regarding her memory issues.   Type 2 diabetes mellitus: Uncontrolled.  Management as per PCP.  I will see her back in 4 weeks.   Nigel Mormon, MD [facility] Pager: [phone] Office: [phone] If no answer Cell [phone]

## 2019-05-27 ENCOUNTER — Encounter: Payer: Self-pay | Admitting: Cardiology

## 2019-05-27 NOTE — Progress Notes (Signed)
Patient here for genetic testing only

## 2019-05-30 ENCOUNTER — Other Ambulatory Visit: Payer: Self-pay | Admitting: Nephrology

## 2019-05-30 DIAGNOSIS — N1832 Chronic kidney disease, stage 3b: Secondary | ICD-10-CM

## 2019-06-04 NOTE — Progress Notes (Addendum)
[facility] Note  06/10/2019     CHIEF COMPLAINT Patient presents for Retina Evaluation   HISTORY OF PRESENT ILLNESS: [name] is a 48 y.o. female who presents to the clinic today for:   HPI    Retina Evaluation    In both eyes.  This started 4 months ago.  Duration of 4 months.  Context:  distance vision, mid-range vision and near vision.  Treatments tried include no treatments.  I, the attending physician,  performed the HPI with the patient and updated documentation appropriately.          Comments    48 y/o female pt referred by Dr. 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  Pt last saw Dr. Frederico Hamman on 10.20.20.  Pt had stroke in 02/2019, and since then [place] has been blurred OU.  Denies pain, flashes, floaters.  VA OU down since last visit w/Dr. Frederico Hamman.  No gtts.  BS this a.m. was 148.  A1C on 10.26.20 was 7.4.       Last edited by Bernarda Caffey, MD on 06/10/2019  8:22 AM. (History)    pt is here on the referral of Dr. Frederico Hamman for possible PDR OU, pt had a stroke in July, she states it affected the right side of her body, her memory and her speech, she states at the time her blood pressure and diabetes was out of control, she states she was also not taking her medication, pts last A1c was 7.4 in October, she has been a pt of Dr. Frederico Hamman for about 10 years, she states she has no eye hx other than wearing glasses, pts blood pressure was 187/94 this morning before she took her meds  Referring physician: Gevena Cotton, MD [address],  [place] [id number]  HISTORICAL INFORMATION:   Selected notes from the [facility] Referred by Dr. Gevena Cotton for concern of PDR OU BCVA 20/30- OD, 20/30- OS   CURRENT MEDICATIONS: No current outpatient medications on file. (Ophthalmic Drugs)   No current facility-administered medications for this visit.  (Ophthalmic Drugs)   Current Outpatient Medications  (Other)  Medication Sig  . Accu-Chek FastClix Lancets MISC 4 (four) times daily.  Marland Kitchen ACCU-CHEK GUIDE test strip See admin instructions. for testing  . albuterol (VENTOLIN HFA) 108 (90 Base) MCG/ACT inhaler Inhale into the lungs every 6 (six) hours as needed for wheezing or shortness of breath.  Marland Kitchen amLODipine (NORVASC) 10 MG tablet Take 1 tablet (10 mg total) by mouth daily.  Marland Kitchen aspirin EC 81 MG EC tablet Take 1 tablet (81 mg total) by mouth daily.  Marland Kitchen atorvastatin (LIPITOR) 40 MG tablet Take 40 mg by mouth at bedtime.  . blood glucose meter kit and supplies KIT Dispense based on patient and insurance preference. Use up to four times daily as directed. (FOR ICD-9 250.00, 250.01).  . carvedilol (COREG) 6.25 MG tablet Take 1 tablet (6.25 mg total) by mouth 2 (two) times daily. (Patient taking differently: Take 12.5 mg by mouth 2 (two) times daily. )  . Cholecalciferol (VITAMIN D3) 25 MCG (1000 UT) CAPS Take 1 capsule by mouth daily.  . cholecalciferol (VITAMIN D3) 25 MCG (1000 UT) tablet Take 1,000 Units by mouth daily.  . furosemide (LASIX) 20 MG tablet Take 20 mg by mouth 2 (two) times daily.  Marland Kitchen glipiZIDE (GLUCOTROL) 10 MG tablet Take 10 mg by mouth daily.  . hydrALAZINE (APRESOLINE) 50  MG tablet Take 50 mg by mouth every 8 (eight) hours.  . insulin aspart protamine- aspart (NOVOLOG MIX 70/30) (70-30) 100 UNIT/ML injection Inject 0.15 mLs (15 Units total) into the skin 2 (two) times daily with a meal. (Patient taking differently: Inject into the skin. 15 units in the am,17 in the evening)  . Insulin Syringes, Disposable, E-952 1 ML MISC 1 application by Does not apply route 2 (two) times a day.  . isosorbide-hydrALAZINE (BIDIL) 20-37.5 MG tablet Take 1 tablet by mouth 3 (three) times daily.  Marland Kitchen KLOR-CON M10 10 MEQ tablet Take 10 mEq by mouth 2 (two) times daily.  Marland Kitchen lisinopril-hydrochlorothiazide (ZESTORETIC) 20-12.5 MG tablet Take 2 tablets by mouth daily.  . magnesium oxide (MAG-OX) 400 (241.3 Mg) MG  tablet Take 1 tablet (400 mg total) by mouth 2 (two) times daily.  . metFORMIN (GLUCOPHAGE) 1000 MG tablet Take 1,000 mg by mouth 2 (two) times daily.  . polyethylene glycol (MIRALAX / GLYCOLAX) 17 g packet Take 17 g by mouth 2 (two) times daily. (Patient taking differently: Take 17 g by mouth as needed. )  . predniSONE (DELTASONE) 20 MG tablet Take 20 mg by mouth daily.  . magnesium oxide (MAG-OX) 400 MG tablet Take 1 tablet by mouth 2 (two) times daily.   No current facility-administered medications for this visit.  (Other)      REVIEW OF SYSTEMS: ROS    Positive for: Neurological, Endocrine, Eyes, Respiratory   Negative for: Constitutional, Gastrointestinal, Skin, Genitourinary, Musculoskeletal, HENT, Cardiovascular, Psychiatric, Allergic/Imm, Heme/Lymph   Last edited by Matthew Folks, COA on 06/10/2019  8:01 AM. (History)       ALLERGIES No Known Allergies  PAST MEDICAL HISTORY Past Medical History:  Diagnosis Date  . Arthritis   . Asthma   . Coronary artery disease   . Diabetes mellitus   . Hyperlipidemia   . Hypertension   . Left thyroid nodule    diagnosed 07/2018  . Pseudotumor cerebri   . Stroke [facility])    Past Surgical History:  Procedure Laterality Date  . ACHILLES TENDON REPAIR      FAMILY HISTORY Family History  Problem Relation Age of Onset  . Asthma Mother   . Hypertension Father   . Kidney disease Father   . Asthma Other   . Hyperlipidemia Other   . Hypertension Other   . Cancer Other   . Diabetes Maternal Grandmother   . Congestive Heart Failure Maternal Grandmother   . Hypertension Sister   . Asthma Sister   . Congestive Heart Failure Maternal Aunt     SOCIAL HISTORY Social History   Tobacco Use  . Smoking status: Former Smoker    Packs/day: 0.50    Years: 30.00    Pack years: 15.00    Types: Cigarettes    Quit date: 2017    Years since quitting: 3.8  . Smokeless tobacco: Former Systems developer    Quit date: 03/31/2016  Substance Use  Topics  . Alcohol use: Yes    Alcohol/week: 0.0 standard drinks    Comment: Occassional   . Drug use: Yes    Types: Marijuana    Comment: Occassional          OPHTHALMIC EXAM:  Base Eye Exam    Visual Acuity (Snellen - Linear)      Right Left   Dist cc 20/60 -2 20/50 -2   Dist ph cc 20/50 20/40   Correction: Glasses       Tonometry (Tonopen,  8:05 AM)      Right Left   Pressure 18 19       Pupils      Dark Light Shape React APD   Right 3 2 Round Brisk None   Left 3 2 Round Brisk None       Visual Fields (Counting fingers)      Left Right    Full Full       Extraocular Movement      Right Left    Full, Ortho Full, Ortho       Neuro/Psych    Oriented x3: Yes   Mood/Affect: Normal       Dilation    Both eyes: 2.5% Phenylephrine @ 8:05 AM        Slit Lamp and Fundus Exam    Slit Lamp Exam      Right Left   Lids/Lashes Mild Meibomian gland dysfunction Mild Meibomian gland dysfunction   Conjunctiva/Sclera Melanosis Melanosis   Cornea 1+ Punctate epithelial erosions, Debris in tear film 1+ Punctate epithelial erosions, Debris in tear film   Anterior Chamber Deep and clear, narrow temporal angle Deep and clear, narrow temporal angle   Iris Round and moderately dilated to 48m, No NVI Round and moderately dilated to 667m No NVI   Lens 1-2+ Nuclear sclerosis, 1-2+ Cortical cataract 1-2+ Nuclear sclerosis, 1-2+ Cortical cataract   Vitreous Mild Vitreous syneresis Mild Vitreous syneresis       Fundus Exam      Right Left   Disc Pink and Sharp Pink and Sharp   C/D Ratio 0.3 0.3   Macula Good foveal reflex, scattered CWS and MA Blunted foveal reflex, scattered MA and exudate, scattered CWS greatest posteriorly   Vessels Vascular attenuation, Copper wiring, Tortuous Vascular attenuation, Copper wiring, Tortuous, flat NV IN to disc   Periphery Attached, patches of IRH / DBH / CWS -- mostly posterior, early flat NV SN to disc, focal pigmented CR scar at 1030  Attached, scattered DBH and CWS greatest posteriorly        Refraction    Wearing Rx      Sphere Cylinder Axis   Right -1.00 +2.00 026   Left -1.25 +0.50 148   Age: 21 yr   Type: SVL       Manifest Refraction      Sphere Cylinder Axis Dist VA   Right -1.50 +0.75 180 20/60   Left -1.50 +0.50 015 20/50          IMAGING AND PROCEDURES  Imaging and Procedures for _0 @  OCT, Retina - OU - Both Eyes       Right Eye Quality was good. Central Foveal Thickness: 230. Progression has no prior data. Findings include normal foveal contour, no IRF, no SRF, intraretinal hyper-reflective material (Focal inter retinal cyst nasal macula).   Left Eye Quality was good. Central Foveal Thickness: 249. Progression has no prior data. Findings include abnormal foveal contour, intraretinal fluid, intraretinal hyper-reflective material, no SRF (Mild IRF / DME and retinal atrophy nasal macula).   Notes *Images captured and stored on drive  Diagnosis / Impression:  OD: NFP, no SRF; +focal cystic changes OS: Mild IRF / DME and retinal atrophy nasal macula  Clinical management:  See below  Abbreviations: NFP - Normal foveal profile. CME - cystoid macular edema. PED - pigment epithelial detachment. IRF - intraretinal fluid. SRF - subretinal fluid. EZ - ellipsoid zone. ERM - epiretinal membrane. ORA - outer retinal atrophy. ORT - outer  retinal tubulation. SRHM - subretinal hyper-reflective material        Fluorescein Angiography Optos (Transit OS)       Right Eye   Progression has no prior data. Early phase findings include microaneurysm, vascular perfusion defect, blockage. Mid/Late phase findings include blockage, leakage, microaneurysm, vascular perfusion defect (No frank NV).   Left Eye   Progression has no prior data. Early phase findings include vascular perfusion defect, microaneurysm, blockage. Mid/Late phase findings include blockage, leakage, microaneurysm, vascular perfusion  defect (No NV).   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 Severe nonproliferative diabetic retinopathy of both eyes with macular edema associated with type 2 diabetes mellitus ([place])  Z61.0960   2. Retinal edema  H35.81 OCT, Retina - OU - Both Eyes  3. Essential hypertension  I10   4. Hypertensive retinopathy of both eyes  H35.033 Fluorescein Angiography Optos (Transit OS)  5. History of stroke  Z86.73   6. Combined forms of age-related cataract of both eyes  H25.813     1,2. 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  - The incidence, risk factors for progression, natural history and treatment options for diabetic retinopathy were discussed with patient.    - The need for close monitoring of blood glucose, blood pressure, and serum lipids, avoiding cigarette or any type of tobacco, and the need for long term follow up was also discussed with patient.  - exam shows scattered [place] and CWS OU, ?flat early NV  - OCT with mild DME OS, cystic changes OD  - FA today (11.02.20) shows no frank NV, but late leaking MA and capillary nonperfusion OU  - discussed findings and prognosis  - f/u in 3 wks -- DFE/OCT, possible laser PRP  3-5. Hypertensive retinopathy OU with history of recent stroke  - stroke July 2020 -- ischemic non-hemorrhagic infarct involving L lateral midbrain/pons  - BP in office today 181/97 (left arm); 164/96 (right arm)  - discussed importance of tight BP control  - monitor  6. 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  - discussed diagnosis and progression  - not yet visually significant  - monitor for now   Ophthalmic Meds Ordered this visit:  No orders of the defined types were placed in this encounter.       Return in about 3 weeks (around 07/01/2019) for f/u PDR OU, DFE, OCT.  There are no Patient Instructions on file for this visit.   Explained the diagnoses, plan, and follow up with the patient and they expressed understanding.  Patient expressed understanding of the importance of proper follow up care.   This document serves as a record of services personally performed by Gardiner Sleeper, MD, PhD. It was created on their behalf by Ernest Mallick, OA, an ophthalmic assistant. The creation of this record is the provider's dictation and/or activities during the visit.    Electronically signed by: Ernest Mallick, OA 11.02.2020 12:18 PM   Gardiner Sleeper, M.D., Ph.D. Diseases & Surgery of the Retina and Vitreous [facility]  I have reviewed the above documentation for accuracy and completeness, and I agree with the above. Gardiner Sleeper,  M.D., Ph.D. 06/10/19 12:18 PM     Abbreviations: M myopia (nearsighted); A astigmatism; H hyperopia (farsighted); P presbyopia; Mrx spectacle prescription;  CTL contact lenses; OD right eye; OS left eye; OU both eyes  XT exotropia; ET esotropia; PEK punctate epithelial keratitis; PEE punctate epithelial erosions; DES dry eye syndrome; MGD meibomian gland dysfunction; ATs artificial tears; PFAT's preservative free artificial tears; [place] nuclear sclerotic cataract; PSC posterior subcapsular cataract; ERM epi-retinal membrane; PVD posterior vitreous detachment; RD retinal detachment; DM diabetes mellitus; DR diabetic retinopathy; NPDR non-proliferative diabetic retinopathy; PDR proliferative diabetic retinopathy; CSME clinically significant macular edema; DME diabetic macular edema; dbh dot blot hemorrhages; CWS cotton wool spot; POAG primary open angle glaucoma; C/D cup-to-disc ratio; HVF humphrey visual field; GVF goldmann visual field; OCT optical coherence tomography; IOP intraocular pressure; BRVO Branch retinal vein occlusion; CRVO central  retinal vein occlusion; CRAO central retinal artery occlusion; BRAO branch retinal artery occlusion; RT retinal tear; SB scleral buckle; PPV pars plana vitrectomy; VH Vitreous hemorrhage; PRP panretinal laser photocoagulation; IVK intravitreal kenalog; VMT vitreomacular traction; MH Macular hole;  NVD neovascularization of the disc; NVE neovascularization elsewhere; AREDS age related eye disease study; ARMD age related macular degeneration; POAG primary open angle glaucoma; EBMD epithelial/anterior basement membrane dystrophy; ACIOL anterior chamber intraocular lens; IOL intraocular lens; PCIOL posterior chamber intraocular lens; Phaco/IOL phacoemulsification with intraocular lens placement; [place] photorefractive keratectomy; LASIK laser assisted in situ keratomileusis; HTN hypertension; DM diabetes mellitus; COPD chronic obstructive pulmonary disease

## 2019-06-05 ENCOUNTER — Ambulatory Visit
Admission: RE | Admit: 2019-06-05 | Discharge: 2019-06-05 | Disposition: A | Discharge status: Home / Self Care | Payer: Medicaid Other | Source: Ambulatory Visit | Attending: Nephrology | Admitting: Nephrology

## 2019-06-05 DIAGNOSIS — N1832 Chronic kidney disease, stage 3b: Secondary | ICD-10-CM

## 2019-06-10 ENCOUNTER — Encounter (INDEPENDENT_AMBULATORY_CARE_PROVIDER_SITE_OTHER): Payer: Self-pay | Admitting: Ophthalmology

## 2019-06-10 ENCOUNTER — Other Ambulatory Visit: Payer: Self-pay

## 2019-06-10 ENCOUNTER — Ambulatory Visit (INDEPENDENT_AMBULATORY_CARE_PROVIDER_SITE_OTHER): Payer: Medicaid Other | Admitting: Ophthalmology

## 2019-06-10 VITALS — BP 164/96 | HR 88

## 2019-06-10 DIAGNOSIS — H35033 Hypertensive retinopathy, bilateral: Secondary | ICD-10-CM

## 2019-06-10 DIAGNOSIS — I10 Essential (primary) hypertension: Secondary | ICD-10-CM

## 2019-06-10 DIAGNOSIS — E113413 Type 2 diabetes mellitus with severe nonproliferative diabetic retinopathy with macular edema, bilateral: Secondary | ICD-10-CM

## 2019-06-10 DIAGNOSIS — H3581 Retinal edema: Secondary | ICD-10-CM | POA: Diagnosis not present

## 2019-06-10 DIAGNOSIS — Z8673 Personal history of transient ischemic attack (TIA), and cerebral infarction without residual deficits: Secondary | ICD-10-CM

## 2019-06-10 DIAGNOSIS — H25813 Combined forms of age-related cataract, bilateral: Secondary | ICD-10-CM

## 2019-06-19 ENCOUNTER — Other Ambulatory Visit: Payer: Self-pay | Admitting: Cardiology

## 2019-06-19 DIAGNOSIS — I10 Essential (primary) hypertension: Secondary | ICD-10-CM

## 2019-06-21 ENCOUNTER — Encounter: Payer: Self-pay | Admitting: Cardiology

## 2019-06-21 ENCOUNTER — Ambulatory Visit (INDEPENDENT_AMBULATORY_CARE_PROVIDER_SITE_OTHER): Payer: Medicaid Other | Admitting: Cardiology

## 2019-06-21 ENCOUNTER — Ambulatory Visit: Payer: Medicaid Other | Admitting: Cardiology

## 2019-06-21 ENCOUNTER — Other Ambulatory Visit: Payer: Self-pay

## 2019-06-21 VITALS — BP 182/100 | HR 91 | Temp 97.5°F | Ht 69.0 in | Wt 238.8 lb

## 2019-06-21 DIAGNOSIS — I10 Essential (primary) hypertension: Secondary | ICD-10-CM | POA: Diagnosis not present

## 2019-06-21 DIAGNOSIS — E1165 Type 2 diabetes mellitus with hyperglycemia: Secondary | ICD-10-CM

## 2019-06-21 DIAGNOSIS — I517 Cardiomegaly: Secondary | ICD-10-CM | POA: Diagnosis not present

## 2019-06-21 DIAGNOSIS — Z8673 Personal history of transient ischemic attack (TIA), and cerebral infarction without residual deficits: Secondary | ICD-10-CM

## 2019-06-21 DIAGNOSIS — E782 Mixed hyperlipidemia: Secondary | ICD-10-CM

## 2019-06-21 MED ORDER — CARVEDILOL 12.5 MG PO TABS: 12.5000 mg | ORAL_TABLET | Freq: Two times a day (BID) | ORAL | 2 refills | Status: DC

## 2019-06-21 NOTE — Progress Notes (Signed)
Patient referred by [name], MD for hypertensive heart disease  Subjective:   [name], female    DOB: 1971-01-25, 48 y.o.   MRN: [id number]  Chief Complaint  Patient presents with  . Hypertension  . Follow-up    48 y.o. 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  She has not been taking carvedilol, as she is out of it. No new complaints.    Past Medical History:  Diagnosis Date  . Arthritis   . Asthma   . Coronary artery disease   . Diabetes mellitus   . Hyperlipidemia   . Hypertension   . Left thyroid nodule    diagnosed 07/2018  . Pseudotumor cerebri   . Stroke [facility])      Past Surgical History:  Procedure Laterality Date  . ACHILLES TENDON REPAIR       Social History   Socioeconomic History  . Marital status: Single    Spouse name: Not on file  . Number of children: 4  . Years of education: Not on file  . Highest education level: Not on file  Occupational History  . Not on file  Social Needs  . Financial resource strain: Not on file  . Food insecurity    Worry: Not on file    Inability: Not on file  . Transportation needs    Medical: Not on file    Non-medical: Not on file  Tobacco Use  . Smoking status: Former Smoker    Packs/day: 0.50    Years: 30.00    Pack years: 15.00    Types: Cigarettes    Quit date: 2017    Years since quitting: 3.8  . Smokeless tobacco: Former Systems developer    Quit date: 03/31/2016  Substance and Sexual Activity  . Alcohol use: Yes    Alcohol/week: 0.0 standard drinks    Comment: Occassional   . Drug use: Yes    Types: Marijuana    Comment: Occassional   . Sexual activity: Yes    Partners: Male    Birth control/protection: Surgical  Lifestyle  . Physical activity    Days per week: Not on file    Minutes per session: Not on file  . Stress: Not on file   Relationships  . Social Herbalist on phone: Not on file    Gets together: Not on file    Attends religious service: Not on file    Active member of club or organization: Not on file    Attends meetings of clubs or organizations: Not on file    Relationship status: Not on file  . Intimate partner violence    Fear of current or ex partner: Not on file    Emotionally abused: Not on file    Physically abused: Not on file    Forced sexual activity: Not on file  Other Topics Concern  . Not on file  Social History Narrative  . Not on file     Family History  Problem Relation Age of Onset  . Asthma Mother   . Hypertension Father   . Kidney disease Father   . Asthma Other   . Hyperlipidemia Other   . Hypertension Other   . Cancer Other   . Diabetes Maternal Grandmother   . Congestive Heart Failure Maternal Grandmother   . Hypertension Sister   . Asthma Sister   .  Congestive Heart Failure Maternal Aunt      Current Outpatient Medications on File Prior to Visit  Medication Sig Dispense Refill  . Accu-Chek FastClix Lancets MISC 4 (four) times daily.    Marland Kitchen ACCU-CHEK GUIDE test strip See admin instructions. for testing    . albuterol (VENTOLIN HFA) 108 (90 Base) MCG/ACT inhaler Inhale into the lungs every 6 (six) hours as needed for wheezing or shortness of breath.    Marland Kitchen amLODipine (NORVASC) 10 MG tablet Take 1 tablet (10 mg total) by mouth daily. 30 tablet 1  . aspirin EC 81 MG EC tablet Take 1 tablet (81 mg total) by mouth daily.    Marland Kitchen atorvastatin (LIPITOR) 40 MG tablet Take 40 mg by mouth at bedtime.    . blood glucose meter kit and supplies KIT Dispense based on patient and insurance preference. Use up to four times daily as directed. (FOR ICD-9 250.00, 250.01). 1 each 0  . Cholecalciferol (VITAMIN D3) 25 MCG (1000 UT) CAPS Take 1 capsule by mouth daily.    . furosemide (LASIX) 20 MG tablet Take 20 mg by mouth 2 (two) times daily.    Marland Kitchen glipiZIDE (GLUCOTROL) 10 MG  tablet Take 10 mg by mouth daily.    . Insulin Syringes, Disposable, Z-858 1 ML MISC 1 application by Does not apply route 2 (two) times a day. 100 each 0  . isosorbide-hydrALAZINE (BIDIL) 20-37.5 MG tablet Take 1 tablet by mouth 3 (three) times daily. 60 tablet 1  . KLOR-CON M10 10 MEQ tablet Take 10 mEq by mouth 2 (two) times daily.    . metFORMIN (GLUCOPHAGE) 1000 MG tablet Take 1,000 mg by mouth 2 (two) times daily.    . polyethylene glycol (MIRALAX / GLYCOLAX) 17 g packet Take 17 g by mouth 2 (two) times daily. (Patient taking differently: Take 17 g by mouth as needed. ) 60 each 0  . insulin aspart protamine- aspart (NOVOLOG MIX 70/30) (70-30) 100 UNIT/ML injection Inject 0.15 mLs (15 Units total) into the skin 2 (two) times daily with a meal. (Patient taking differently: Inject into the skin. 15 units in the am,17 in the evening) 10 mL 11  . lisinopril-hydrochlorothiazide (ZESTORETIC) 20-12.5 MG tablet Take 2 tablets by mouth daily.    . magnesium oxide (MAG-OX) 400 (241.3 Mg) MG tablet Take 1 tablet (400 mg total) by mouth 2 (two) times daily. (Patient not taking: Reported on 06/21/2019) 60 tablet 1  . magnesium oxide (MAG-OX) 400 MG tablet Take 1 tablet by mouth 2 (two) times daily.     No current facility-administered medications on file prior to visit.     Cardiovascular studies:  EKG 04/09/2019:  Sinus rhythm 76 bpm. Left atrial enlargement.  Possible old anteroseptal infarct. Poor R-wave progression. Inferolateral T wave inversion, possible ischemia.  CTA head/neck 02/12/2019: 1. Diffuse distal branch vessel irregularity throughout the circle-of-Willis without a significant proximal stenosis or occlusion. 2. Atherosclerotic changes are evident within the cavernous internal carotid arteries and right greater than left M1 segments with diffuse vessel irregularity and mild narrowing of less than 50% relative to the more distal vessels. 3. Tortuosity of the cervical internal  carotid arteries and vertebral arteries bilaterally without significant stenosis. This is nonspecific, but often seen in the setting of chronic hypertension. 4. Brainstem infarct and white matter parenchymal changes noted on MRI are not discernible by CT.  Echocardiogram 02/12/2019:  1. The left ventricle has normal systolic function with an ejection fraction of 60-65%. The cavity size was  normal. There is severe concentric left ventricular hypertrophy. Left ventricular diastolic Doppler parameters are consistent with impaired  relaxation.  2. The right ventricle has normal systolic function. The cavity was normal. There is no increase in right ventricular wall thickness.  3. Small pericardial effusion.  4. The mitral valve is abnormal. Mild thickening of the mitral valve leaflet. No evidence of mitral valve stenosis.  5. The aortic valve is grossly normal. Mild thickening of the aortic valve. Mild calcification of the aortic valve. Aortic valve regurgitation is mild by color flow Doppler. No stenosis of the aortic valve.  6. There is a small area of calcification on the [place].  7. The aortic root and ascending aorta are normal in size and structure.  8. The interatrial septum was not assessed.  MRI brain 02/11/2019: 1. Patchy small volume acute ischemic nonhemorrhagic infarct involving the left lateral midbrain/pons. 2. Underlying mild chronic microvascular ischemic disease.  Echocardiogram 07/20/2018: Left ventricle cavity is normal in size. Severe concentric hypertrophy of the left ventricle measuring 2.2 cm. Normal global wall motion. Doppler evidence of grade II (pseudonormal) diastolic dysfunction. Diastolic dysfunction findings suggests elevated LA/LV end diastolic pressure. Visual EF 45-50%. Calculated LVEF 55%. Consider hypertrophic cardiomyopathy/infiltrative vs hypertensive heart disease. Left atrial cavity is severely dilated at 5 cm. Trileaflet aortic valve with mild (Grade I)  regurgitation. Mild aortic valve leaflet thickening. Mild (Grade I) mitral regurgitation. Insignificant pericardial effusion.  Recent labs: 03/12/2019: Glucose 170.  BUN/creatinine 26/1.3.  EGFR 57.  NA/K1 39/4.2. H/H 12.6/37.5.  MCV 91.  Platelets 245.  Normal iron studies. Hemoglobin A1c 8.5%. Cholesterol 106, triglycerides 103, HDL 38, LDL 47   Review of Systems  Constitution: Negative for decreased appetite, malaise/fatigue, weight gain and weight loss.  HENT: Negative for congestion.   Eyes: Negative for visual disturbance.  Cardiovascular: Negative for chest pain, dyspnea on exertion, leg swelling, palpitations and syncope.  Respiratory: Negative for cough.   Endocrine: Negative for cold intolerance.  Hematologic/Lymphatic: Does not bruise/bleed easily.  Skin: Negative for itching and rash.  Musculoskeletal: Negative for myalgias.  Gastrointestinal: Negative for abdominal pain, nausea and vomiting.  Genitourinary: Negative for dysuria.  Neurological: Positive for numbness. Negative for dizziness and weakness.       Foot drop  Psychiatric/Behavioral: The patient is not nervous/anxious.   All other systems reviewed and are negative.        Vitals:   06/21/19 1203  BP: (!) 182/100  Pulse: 91  Temp: (!) 97.5 F (36.4 C)     Objective:   Physical Exam  Constitutional: She is oriented to person, place, and time. She appears well-developed and well-nourished. No distress.  HENT:  Head: Normocephalic and atraumatic.  Eyes: Pupils are equal, round, and reactive to light. Conjunctivae are normal.  Neck: No JVD present.  Cardiovascular: Normal rate, regular rhythm and intact distal pulses.  Murmur heard.  Early systolic murmur is present with a grade of 2/6 at the lower right sternal border. Pulmonary/Chest: Effort normal and breath sounds normal. She has no wheezes. She has no rales.  Abdominal: Soft. Bowel sounds are normal. There is no rebound.  Musculoskeletal:         General: No edema.  Lymphadenopathy:    She has no cervical adenopathy.  Neurological: She is alert and oriented to person, place, and time. No cranial nerve deficit.  Skin: Skin is warm and dry.  Psychiatric: She has a normal mood and affect.  Nursing note and vitals reviewed.  Assessment & Recommendations:   48 y.o. 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  Hypertension: Uncontrolled.  Strong family history of hypertension. Refilled carvedilol at 12.5 mg bid.  Continue Bidil 20-37.5 mg 1 tab bid, amlodipine, hydralazine, lisinopril-HCTZ.  Left ventricular hypertrophy: Severe.  Seen on 2 separate echocardiograms and 2019 and 2020.  While hypertensive cardiomyopathy is possible, given patient's history of malignant hypertension, her EKG QRS amplitude is underwhelming to suggest hypertensive cardiomyopathy.  Given family history of congestive heart failure in an African-American patient, amyloidosis is certainly in the differential.  Fortunately, patient does not have any signs or symptoms of heart failure at this time.  Nonetheless, she will benefit from work-up for hATTR.  If positive, she will benefit from therapy to reduce disease progression.  Genetic testing for hATTR, results pending. After adequate control of blood pressure, I will obtain by serum and urine immunofixation studies, and PYP nuclear scan in the future.   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  She has completed aspirin and Plavix for 3 weeks after the stroke.  Continue aspirin and statin.  Continue management of risk factors, including blood pressure and diabetes control, as well as statin use. I have encouraged her to speak with her neurologist regarding her memory issues.   Type 2 diabetes  mellitus: Uncontrolled.  Management as per PCP.  I will see her back in 4 weeks.   Nigel Mormon, MD [facility] [place] Pager: [phone] Office: [phone] If no answer Cell [phone]

## 2019-06-25 NOTE — Progress Notes (Signed)
[facility] Note  07/01/2019     CHIEF COMPLAINT Patient presents for Retina Follow Up   HISTORY OF PRESENT ILLNESS: [name] is a 48 y.o. female who presents to the clinic today for:   HPI    Retina Follow Up    Patient presents with  Diabetic Retinopathy.  In both eyes.  This started weeks ago.  Severity is moderate.  Duration of weeks.  Since onset it is stable.  I, the attending physician,  performed the HPI with the patient and updated documentation appropriately.          Comments    BS: 120 this AM Patient states her vision is stable OU.  Denies eye pain.  Denies any new or worsening floaters or fol OU.       Last edited by Bernarda Caffey, MD on 07/01/2019  9:12 AM. (History)    pts blood pressure is 209/112 in her left arm today, she states she is unsure which bp meds she is supposed to be taking, so she has reached out to her PCP for clarification   Referring physician: Gevena Cotton, MD [place],  [place] [address]  HISTORICAL INFORMATION:   Selected notes from the MEDICAL RECORD NUMBER Referred by Dr. Gevena Cotton for concern of PDR OU BCVA 20/30- OD, 20/30- OS   CURRENT MEDICATIONS: No current outpatient medications on file. (Ophthalmic Drugs)   No current facility-administered medications for this visit.  (Ophthalmic Drugs)   Current Outpatient Medications (Other)  Medication Sig  . Accu-Chek FastClix Lancets MISC 4 (four) times daily.  Marland Kitchen ACCU-CHEK GUIDE test strip See admin instructions. for testing  . albuterol (VENTOLIN HFA) 108 (90 Base) MCG/ACT inhaler Inhale into the lungs every 6 (six) hours as needed for wheezing or shortness of breath.  Marland Kitchen amLODipine (NORVASC) 10 MG tablet Take 1 tablet (10 mg total) by mouth daily.  Marland Kitchen aspirin EC 81 MG EC tablet Take 1 tablet (81 mg total) by mouth daily.  Marland Kitchen atorvastatin (LIPITOR) 40 MG tablet Take 40 mg by mouth at bedtime.  . blood glucose  meter kit and supplies KIT Dispense based on patient and insurance preference. Use up to four times daily as directed. (FOR ICD-9 250.00, 250.01).  . carvedilol (COREG) 12.5 MG tablet Take 1 tablet (12.5 mg total) by mouth 2 (two) times daily with a meal.  . Cholecalciferol (VITAMIN D3) 25 MCG (1000 UT) CAPS Take 1 capsule by mouth daily.  . furosemide (LASIX) 20 MG tablet Take 20 mg by mouth 2 (two) times daily.  Marland Kitchen glipiZIDE (GLUCOTROL) 10 MG tablet Take 10 mg by mouth daily.  . insulin aspart protamine- aspart (NOVOLOG MIX 70/30) (70-30) 100 UNIT/ML injection Inject 0.15 mLs (15 Units total) into the skin 2 (two) times daily with a meal. (Patient taking differently: Inject into the skin. 15 units in the am,17 in the evening)  . Insulin Syringes, Disposable, N-053 1 ML MISC 1 application by Does not apply route 2 (two) times a day.  . isosorbide-hydrALAZINE (BIDIL) 20-37.5 MG tablet Take 1 tablet by mouth 3 (three) times daily.  Marland Kitchen KLOR-CON M10 10 MEQ tablet Take 10 mEq by mouth 2 (two) times daily.  Marland Kitchen lisinopril-hydrochlorothiazide (ZESTORETIC) 20-12.5 MG tablet Take 2 tablets by mouth daily.  . magnesium oxide (MAG-OX) 400 (241.3 Mg) MG tablet Take 1 tablet (400 mg total) by mouth 2 (two) times daily. (Patient not taking: Reported on 06/21/2019)  .  magnesium oxide (MAG-OX) 400 MG tablet Take 1 tablet by mouth 2 (two) times daily.  . metFORMIN (GLUCOPHAGE) 1000 MG tablet Take 1,000 mg by mouth 2 (two) times daily.  . polyethylene glycol (MIRALAX / GLYCOLAX) 17 g packet Take 17 g by mouth 2 (two) times daily. (Patient taking differently: Take 17 g by mouth as needed. )   No current facility-administered medications for this visit.  (Other)      REVIEW OF SYSTEMS: ROS    Positive for: Neurological, Endocrine, Eyes, Respiratory   Negative for: Constitutional, Gastrointestinal, Skin, Genitourinary, Musculoskeletal, HENT, Cardiovascular, Psychiatric, Allergic/Imm, Heme/Lymph   Last edited by  Doneen Poisson on 07/01/2019  8:32 AM. (History)       ALLERGIES No Known Allergies  PAST MEDICAL HISTORY Past Medical History:  Diagnosis Date  . Arthritis   . Asthma   . Coronary artery disease   . Diabetes mellitus   . Hyperlipidemia   . Hypertension   . Left thyroid nodule    diagnosed 07/2018  . Pseudotumor cerebri   . Stroke [facility])    Past Surgical History:  Procedure Laterality Date  . ACHILLES TENDON REPAIR      FAMILY HISTORY Family History  Problem Relation Age of Onset  . Asthma Mother   . Hypertension Father   . Kidney disease Father   . Asthma Other   . Hyperlipidemia Other   . Hypertension Other   . Cancer Other   . Diabetes Maternal Grandmother   . Congestive Heart Failure Maternal Grandmother   . Hypertension Sister   . Asthma Sister   . Congestive Heart Failure Maternal Aunt     SOCIAL HISTORY Social History   Tobacco Use  . Smoking status: Former Smoker    Packs/day: 0.50    Years: 30.00    Pack years: 15.00    Types: Cigarettes    Quit date: 2017    Years since quitting: 3.8  . Smokeless tobacco: Former Systems developer    Quit date: 03/31/2016  Substance Use Topics  . Alcohol use: Yes    Alcohol/week: 0.0 standard drinks    Comment: Occassional   . Drug use: Yes    Types: Marijuana    Comment: Occassional          OPHTHALMIC EXAM:  Base Eye Exam    Visual Acuity (Snellen - Linear)      Right Left   Dist cc 20/60 20/50   Dist ph cc 20/40 20/40   Correction: Glasses       Tonometry (Tonopen, 8:37 AM)      Right Left   Pressure 23 23       Pupils      Dark Light Shape React APD   Right 2 1 Round Minimal 0   Left 2 1 Round Minimal 0       Visual Fields      Left Right    Full Full       Extraocular Movement      Right Left    Full Full       Neuro/Psych    Oriented x3: Yes   Mood/Affect: Normal       Dilation    Both eyes: 1.0% Mydriacyl, 2.5% Phenylephrine @ 8:37 AM        Slit Lamp and Fundus Exam     Slit Lamp Exam      Right Left   Lids/Lashes Mild Meibomian gland dysfunction Mild Meibomian gland dysfunction  Conjunctiva/Sclera Melanosis Melanosis   Cornea Trace inferior Punctate epithelial erosions 1+ Punctate epithelial erosions, Debris in tear film   Anterior Chamber Deep and clear, narrow temporal angle Deep and clear, narrow temporal angle   Iris Round and moderately dilated to 53m, No NVI Round and moderately dilated to 673m No NVI   Lens 1-2+ Nuclear sclerosis, 1-2+ Cortical cataract 1-2+ Nuclear sclerosis, 1-2+ Cortical cataract   Vitreous Mild Vitreous syneresis Mild Vitreous syneresis       Fundus Exam      Right Left   Disc Pink and Sharp Pink and Sharp, mild tilt   C/D Ratio 0.3 0.3   Macula Good foveal reflex, scattered CWS, MA and IRH Blunted foveal reflex, scattered MA and exudate, scattered CWS greatest posteriorly   Vessels Vascular attenuation, Tortuous, Copper wiring, AV crossing changes Vascular attenuation, Tortuous, Copper wiring, AV crossing changes, flat NV SN to disc   Periphery Attached, patches of IRH / DBH / CWS -- mostly posterior, early flat NV SN to disc, focal pigmented CR scar at 1030 Attached, scattered DBH and CWS greatest posteriorly        Refraction    Wearing Rx      Sphere Cylinder Axis   Right -1.00 +2.00 026   Left -1.25 +0.50 148   Type: SVL          IMAGING AND PROCEDURES  Imaging and Procedures for '@TODAY'$ @  OCT, Retina - OU - Both Eyes       Right Eye Quality was good. Central Foveal Thickness: 228. Progression has improved. Findings include normal foveal contour, no SRF, intraretinal hyper-reflective material, intraretinal fluid (Interval improvement in cystic changes).   Left Eye Quality was good. Central Foveal Thickness: 252. Progression has been stable. Findings include abnormal foveal contour, intraretinal fluid, intraretinal hyper-reflective material, no SRF (Mild IRF / DME and retinal atrophy IN macula).    Notes *Images captured and stored on drive  Diagnosis / Impression:  OD: NFP, no SRF; interval improvement in cystic changes OS: Mild IRF / DME and retinal atrophy inf nasal macula  Clinical management:  See below  Abbreviations: NFP - Normal foveal profile. CME - cystoid macular edema. PED - pigment epithelial detachment. IRF - intraretinal fluid. SRF - subretinal fluid. EZ - ellipsoid zone. ERM - epiretinal membrane. ORA - outer retinal atrophy. ORT - outer retinal tubulation. SRHM - subretinal hyper-reflective material                 ASSESSMENT/PLAN:    ICD-10-CM   1. Severe nonproliferative diabetic retinopathy of both eyes with macular edema associated with type 2 diabetes mellitus (HCSumner [id number] 2. Retinal edema  H35.81 OCT, Retina - OU - Both Eyes  3. Essential hypertension  I10   4. Combined forms of age-related cataract of both eyes  H25.813   5. History of stroke  Z86.73   6. Hypertensive retinopathy of both eyes  H35.033     1,2. 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  - exam shows scattered IRH and CWS OU  - OCT with mild DME OS, interval improvement in cystic changes OD  - FA (11.02.20) shows no frank NV, but late leaking MA and capillary nonperfusion OU  - discussed findings and prognosis  - f/u in 6 wks -- DFE/OCT, possible focal laser  3-5. Hypertensive retinopathy OU with history of recent stroke  - stroke July 2020 -- ischemic non-hemorrhagic infarct involving L lateral midbrain/pons  - BP in  office today 209/112 (left arm)  - pt reports recent changes in BP meds, but was given two types of amlodipine by pharmacy -- pt seeking to clarify which is the correct one to take  - pt asymptomatic, but recommend contact with PCP / cardiology to clarify BP med regimen  - discussed importance of tight BP control  - monitor  6. Mixed form age related cataract OU  - The symptoms of cataract, surgical options, and treatments and risks were  discussed with patient.  - discussed diagnosis and progression  - not yet visually significant  - monitor for now   Ophthalmic Meds Ordered this visit:  No orders of the defined types were placed in this encounter.      Return in about 6 weeks (around 08/12/2019) for f/u NPDR OU, DFE, OCT.  There are no Patient Instructions on file for this visit.   Explained the diagnoses, plan, and follow up with the patient and they expressed understanding.  Patient expressed understanding of the importance of proper follow up care.   This document serves as a record of services personally performed by Gardiner Sleeper, MD, PhD. It was created on their behalf by Leeann Must, [place], a certified ophthalmic assistant. The creation of this record is the provider's dictation and/or activities during the visit.    Electronically signed by: Leeann Must, [place] 11.17.2020 9:43 AM   This document serves as a record of services personally performed by Gardiner Sleeper, MD, PhD. It was created on their behalf by Ernest Mallick, OA, an ophthalmic assistant. The creation of this record is the provider's dictation and/or activities during the visit.    Electronically signed by: Ernest Mallick, OA 11.23.2020 9:43 AM   Gardiner Sleeper, M.D., Ph.D. Diseases & Surgery of the Retina and Vitreous [facility]  I have reviewed the above documentation for accuracy and completeness, and I agree with the above. Gardiner Sleeper, M.D., Ph.D. 07/01/19 9:43 AM    Abbreviations: M myopia (nearsighted); A astigmatism; H hyperopia (farsighted); P presbyopia; Mrx spectacle prescription;  CTL contact lenses; OD right eye; OS left eye; OU both eyes  XT exotropia; ET esotropia; PEK punctate epithelial keratitis; PEE punctate epithelial erosions; DES dry eye syndrome; MGD meibomian gland dysfunction; ATs artificial tears; PFAT's preservative free artificial tears; [place] nuclear sclerotic cataract; PSC posterior  subcapsular cataract; ERM epi-retinal membrane; PVD posterior vitreous detachment; RD retinal detachment; DM diabetes mellitus; DR diabetic retinopathy; NPDR non-proliferative diabetic retinopathy; PDR proliferative diabetic retinopathy; CSME clinically significant macular edema; DME diabetic macular edema; dbh dot blot hemorrhages; CWS cotton wool spot; POAG primary open angle glaucoma; C/D cup-to-disc ratio; HVF humphrey visual field; GVF goldmann visual field; OCT optical coherence tomography; IOP intraocular pressure; BRVO Branch retinal vein occlusion; CRVO central retinal vein occlusion; CRAO central retinal artery occlusion; BRAO branch retinal artery occlusion; RT retinal tear; SB scleral buckle; PPV pars plana vitrectomy; VH Vitreous hemorrhage; PRP panretinal laser photocoagulation; IVK intravitreal kenalog; VMT vitreomacular traction; MH Macular hole;  NVD neovascularization of the disc; NVE neovascularization elsewhere; AREDS age related eye disease study; ARMD age related macular degeneration; POAG primary open angle glaucoma; EBMD epithelial/anterior basement membrane dystrophy; ACIOL anterior chamber intraocular lens; IOL intraocular lens; PCIOL posterior chamber intraocular lens; Phaco/IOL phacoemulsification with intraocular lens placement; [place] photorefractive keratectomy; LASIK laser assisted in situ keratomileusis; HTN hypertension; DM diabetes mellitus; COPD chronic obstructive pulmonary disease

## 2019-07-01 ENCOUNTER — Ambulatory Visit (INDEPENDENT_AMBULATORY_CARE_PROVIDER_SITE_OTHER): Payer: Medicaid Other | Admitting: Ophthalmology

## 2019-07-01 ENCOUNTER — Encounter (INDEPENDENT_AMBULATORY_CARE_PROVIDER_SITE_OTHER): Payer: Self-pay | Admitting: Ophthalmology

## 2019-07-01 VITALS — BP 209/112 | HR 88

## 2019-07-01 DIAGNOSIS — H3581 Retinal edema: Secondary | ICD-10-CM

## 2019-07-01 DIAGNOSIS — H35033 Hypertensive retinopathy, bilateral: Secondary | ICD-10-CM

## 2019-07-01 DIAGNOSIS — H25813 Combined forms of age-related cataract, bilateral: Secondary | ICD-10-CM

## 2019-07-01 DIAGNOSIS — E113413 Type 2 diabetes mellitus with severe nonproliferative diabetic retinopathy with macular edema, bilateral: Secondary | ICD-10-CM | POA: Diagnosis not present

## 2019-07-01 DIAGNOSIS — I10 Essential (primary) hypertension: Secondary | ICD-10-CM | POA: Diagnosis not present

## 2019-07-01 DIAGNOSIS — Z8673 Personal history of transient ischemic attack (TIA), and cerebral infarction without residual deficits: Secondary | ICD-10-CM

## 2019-07-24 ENCOUNTER — Encounter: Payer: Self-pay | Admitting: Neurology

## 2019-07-24 ENCOUNTER — Telehealth (INDEPENDENT_AMBULATORY_CARE_PROVIDER_SITE_OTHER): Payer: Medicaid Other | Admitting: Neurology

## 2019-07-24 DIAGNOSIS — G4733 Obstructive sleep apnea (adult) (pediatric): Secondary | ICD-10-CM | POA: Diagnosis not present

## 2019-07-24 DIAGNOSIS — G3184 Mild cognitive impairment, so stated: Secondary | ICD-10-CM

## 2019-07-24 DIAGNOSIS — I6381 Other cerebral infarction due to occlusion or stenosis of small artery: Secondary | ICD-10-CM

## 2019-07-24 DIAGNOSIS — R413 Other amnesia: Secondary | ICD-10-CM

## 2019-07-24 NOTE — Progress Notes (Signed)
Virtual Visit via Video Note  I connected with [name] on 07/24/19 at  8:30 AM EST by a video enabled telemedicine application and verified that I am speaking with the correct person using two identifiers.  Location: Patient: at home Provider: at office Ref MD ; Doristine Section Bonsu  Reason for referral : memory loss and stroke I discussed the limitations of evaluation and management by telemedicine and the availability of in person appointments. The patient expressed understanding and agreed to proceed.  History of Present Illness: [name] is a 48 year old African-American lady seen today for initial virtual video consultation visit for stroke and memory loss.  History is obtained from the patient, review of electronic medical records and referral notes and I personally reviewed imaging films in PACS.  She developed sudden onset of facial numbness as well as right-sided weakness and gait difficulty on 02/12/2019 and was [facility] to [facility].  She presented beyond time window for thrombolysis.  MRI scan of the brain showed a small midbrain and brainstem lacunar infarct.  CT angiogram of the brain and neck both did not reveal significant large vessel stenosis or occlusion.  Transthoracic echo showed normal ejection fraction.  Hemoglobin A1c was elevated at 8.8 and LDL cholesterol was 57 mg percent.  Patient is started on dual antiplatelet therapy and advised aggressive risk factor modification and treatment for sleep apnea with CPAP.  Patient improved gradually with physical occupational therapy was initially walking with a cane and now she can walk independently.  She still has some mild residual right facial numbness but right-sided strength has improved.  She walks independently indoors and short distances and uses cane only for long distances.  The patient has not had any recurrent stroke or TIA symptoms since then.  She states her blood pressure is well controlled and sugars are also  doing better.  She however has not been able to tolerate her CPAP ever since Covid pandemic began and has not seen a medical doctor for troubleshooting this.  She however is concerned today about memory and cognitive difficulties that she has been having ever since the stroke.  She states that she has trouble finding words and at times completing sentences.  She may remember the words later on.  She denies feeling depressed or anxious.  She denies headache or any other new neurological symptoms.  She is still independent and manages all her affairs.  There is strong family history of multiple family members with memory problems as well as strokes.  There is no history of significant head injury with loss of consciousness, seizures or drug abuse.  She has not had any recent work-up for her memory loss issues on her brain imaging study    Observations/Objective: Physical and neurological exam is limited due to constraints from video visit. Pleasant obese middle-aged African-American lady who appears not to be in distress. She is awake alert oriented to time place and person.  Speech and language appear normal.  She has intact attention registration.  Recall is diminished 2/3.  Able to name 9 animals that can walk on 4 legs.  Clock drawing is normal at 4/4.  And affect seems normal.  Extraocular movements are full range without nystagmus.  Face appears symmetric without weakness.  Tongue is midline.  Motor system exam reveals no upper or lower extremity drift with symmetric and equal strength.  Fine finger movements are actually diminished on the right compared to the left.  Sensation was not tested  coordination seems normal.  Gait appears slow cautious but steady.  Tandem walking was not performed.  Assessment and Plan: 48 year old African-American lady with left midbrain lacunar infarct in July 2020 secondary to small vessel disease with multiple vascular risk factors of diabetes, hypertension,  hyperlipidemia, cigarette smoking, obesity and sleep apnea.  New complaints of memory difficulties likely due to mild cognitive impairment.  Etiology to be determined but untreated sleep apnea may be contributing.  I had a long discussion with the patient with regards to recent lacunar stroke as well as new complaints of memory loss and discussed plan for evaluation and treatment and answered questions.  1.Continue aspirin for stroke prevention with strict control of hypertension with blood pressure goal below 130/90, lipids with LDL cholesterol goal below 70 mg percent and diabetes with hemoglobin A1c goal below 6.5. will check f/u lipids and HbA1c 2.  I have counseled the patient to quit smoking and eat a healthy diet with lots of fruits, vegetables, cereals, whole grains and to exercise regularly and lose weight. 3.I have counseled the patient to be compliant with using CPAP regularly as it may help improve her memory difficulties.  I recommend referral to sleep physician in [facility] neurology to discuss treatment options and to troubleshoot her CPAP mask issues. 4.Check memory panel labs, EEG and MRI scan of the brain to look for reversible causes of memory loss.  I recommend she increase participation in cognitively challenging activities like solving crossword puzzles, playing bridge and sudoku.  I also advised her to start taking fish oil 1000 mg daily for memory loss.  Follow Up Instructions: Follow-up in 2 months.     I discussed the assessment and treatment plan with the patient. The patient was provided an opportunity to ask questions and all were answered. The patient agreed with the plan and demonstrated an understanding of the instructions.   The patient was advised to call back or seek an in-person evaluation if the symptoms worsen or if the condition fails to improve as anticipated.  I provided 45 minutes of non-face-to-face time during this encounter.   Antony Contras, MD

## 2019-07-31 ENCOUNTER — Other Ambulatory Visit (INDEPENDENT_AMBULATORY_CARE_PROVIDER_SITE_OTHER): Payer: Self-pay

## 2019-07-31 ENCOUNTER — Other Ambulatory Visit: Payer: Self-pay

## 2019-07-31 DIAGNOSIS — Z0289 Encounter for other administrative examinations: Secondary | ICD-10-CM

## 2019-08-01 LAB — LIPID PANEL
Chol/HDL Ratio: 4.3 ratio (ref 0.0–4.4)
Cholesterol, Total: 167 mg/dL (ref 100–199)
HDL: 39 mg/dL — ABNORMAL LOW (ref >39)
LDL Chol Calc (NIH): 81 mg/dL (ref 0–99)
Triglycerides: 287 mg/dL — ABNORMAL HIGH (ref 0–149)
VLDL Cholesterol Cal: 47 mg/dL — ABNORMAL HIGH (ref 5–40)

## 2019-08-01 LAB — HEMOGLOBIN A1C
Est. average glucose Bld gHb Est-mCnc: 154 mg/dL
Hgb A1c MFr Bld: 7 % — ABNORMAL HIGH (ref 4.8–5.6)

## 2019-08-01 LAB — DEMENTIA PANEL
Homocysteine: 14 umol/L (ref 0.0–14.5)
RPR Ser Ql: NONREACTIVE
TSH: 1.38 u[IU]/mL (ref 0.450–4.500)
Vitamin B-12: >2000 pg/mL — ABNORMAL HIGH (ref 232–1245)

## 2019-08-05 ENCOUNTER — Telehealth: Payer: Self-pay

## 2019-08-05 NOTE — Telephone Encounter (Signed)
I called pt to give lab work results.I stated patient that dementia panel labs were normal. Hemoglobin A1c was better than before but still slightly high at 7.0 but acceptable. Cholesterol profile was mostly satisfactory except triglycerides were still elevated at 287 and LDL was borderline at 81 she needs to see primary care physician f Dr. Vista Lawman or medication adjustment. PT verbalized understanding and will contact her PCP.

## 2019-08-05 NOTE — Telephone Encounter (Signed)
-----   Message from Garvin Fila, MD sent at 08/02/2019  2:10 PM EST ----- Kindly inform the patient that dementia panel labs were normal.  Hemoglobin A1c was better than before but still slightly high at 7.0 but acceptable.  Cholesterol profile was mostly satisfactory except triglycerides were still elevated at 287 and LDL was borderline at 81 she needs to see primary care physician f Dr. Vista Lawman or medication adjustment.

## 2019-08-07 NOTE — Progress Notes (Signed)
Patient referred by [name], MD for hypertensive heart disease  Subjective:   [name], female    DOB: [birth year], 48 y.o.   MRN: [id number]   Chief Complaint  Patient presents with  . Hypertension  . Follow-up    4wk    48 y.o. 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  Blood pressure is improving. hATTR genetic testing is negative. She denies chest pain, shortness of breath, palpitations, leg edema, orthopnea, PND, TIA/syncope.   Past Medical History:  Diagnosis Date  . Arthritis   . Asthma   . CKD (chronic kidney disease)   . Coronary artery disease   . Diabetes mellitus   . Hyperlipidemia   . Hypertension   . Left thyroid nodule    diagnosed 07/2018  . Pseudotumor cerebri   . Stroke ([place])   . Vitamin D deficiency      Past Surgical History:  Procedure Laterality Date  . ACHILLES TENDON REPAIR       Social History   Socioeconomic History  . Marital status: Single    Spouse name: Not on file  . Number of children: 4  . Years of education: Not on file  . Highest education level: Not on file  Occupational History  . Not on file  Tobacco Use  . Smoking status: Former Smoker    Packs/day: 0.50    Years: 30.00    Pack years: 15.00    Types: Cigarettes    Quit date: 2017    Years since quitting: 3.9  . Smokeless tobacco: Former Systems developer    Quit date: 03/31/2016  Substance and Sexual Activity  . Alcohol use: Yes    Alcohol/week: 0.0 standard drinks    Comment: Occassional   . Drug use: Yes    Types: Marijuana    Comment: Occassional   . Sexual activity: Yes    Partners: Male    Birth control/protection: Surgical  Other Topics Concern  . Not on file  Social History Narrative  . Not on file   Social Determinants of Health   Financial Resource Strain:   . Difficulty of Paying Living  Expenses: Not on file  Food Insecurity:   . Worried About Charity fundraiser in the Last Year: Not on file  . Ran Out of Food in the Last Year: Not on file  Transportation Needs:   . Lack of Transportation (Medical): Not on file  . Lack of Transportation (Non-Medical): Not on file  Physical Activity:   . Days of Exercise per Week: Not on file  . Minutes of Exercise per Session: Not on file  Stress:   . Feeling of Stress : Not on file  Social Connections:   . Frequency of Communication with Friends and Family: Not on file  . Frequency of Social Gatherings with Friends and Family: Not on file  . Attends Religious Services: Not on file  . Active Member of Clubs or Organizations: Not on file  . Attends Archivist Meetings: Not on file  . Marital Status: Not on file  Intimate Partner Violence:   . Fear of Current or Ex-Partner: Not on file  . Emotionally Abused: Not on file  . Physically Abused: Not on file  . Sexually Abused: Not on file     Family History  Problem Relation Age of Onset  . Asthma Mother   . Hypertension  Father   . Kidney disease Father   . Asthma Other   . Hyperlipidemia Other   . Hypertension Other   . Cancer Other   . Diabetes Maternal Grandmother   . Congestive Heart Failure Maternal Grandmother   . Hypertension Sister   . Asthma Sister   . Congestive Heart Failure Maternal Aunt      Current Outpatient Medications on File Prior to Visit  Medication Sig Dispense Refill  . Accu-Chek FastClix Lancets MISC 4 (four) times daily.    Marland Kitchen ACCU-CHEK GUIDE test strip See admin instructions. for testing    . albuterol (VENTOLIN HFA) 108 (90 Base) MCG/ACT inhaler Inhale into the lungs every 6 (six) hours as needed for wheezing or shortness of breath.    Marland Kitchen amLODipine (NORVASC) 10 MG tablet Take 1 tablet (10 mg total) by mouth daily. 30 tablet 1  . aspirin EC 81 MG EC tablet Take 1 tablet (81 mg total) by mouth daily.    Marland Kitchen atorvastatin (LIPITOR) 40 MG  tablet Take 40 mg by mouth at bedtime.    . blood glucose meter kit and supplies KIT Dispense based on patient and insurance preference. Use up to four times daily as directed. (FOR ICD-9 250.00, 250.01). 1 each 0  . carvedilol (COREG) 12.5 MG tablet Take 1 tablet (12.5 mg total) by mouth 2 (two) times daily with a meal. 120 tablet 2  . Cholecalciferol (VITAMIN D3) 25 MCG (1000 UT) CAPS Take 1 capsule by mouth daily.    . furosemide (LASIX) 20 MG tablet Take 20 mg by mouth 2 (two) times daily.    Marland Kitchen glipiZIDE (GLUCOTROL) 10 MG tablet Take 10 mg by mouth daily.    . insulin aspart protamine- aspart (NOVOLOG MIX 70/30) (70-30) 100 UNIT/ML injection Inject 0.15 mLs (15 Units total) into the skin 2 (two) times daily with a meal. (Patient taking differently: Inject into the skin. 15 units in the am,17 in the evening) 10 mL 11  . Insulin Syringes, Disposable, A-630 1 ML MISC 1 application by Does not apply route 2 (two) times a day. 100 each 0  . isosorbide-hydrALAZINE (BIDIL) 20-37.5 MG tablet Take 1 tablet by mouth 3 (three) times daily. 60 tablet 1  . KLOR-CON M10 10 MEQ tablet Take 10 mEq by mouth 2 (two) times daily.    Marland Kitchen lisinopril-hydrochlorothiazide (ZESTORETIC) 20-12.5 MG tablet Take 2 tablets by mouth daily.    . magnesium oxide (MAG-OX) 400 (241.3 Mg) MG tablet Take 1 tablet (400 mg total) by mouth 2 (two) times daily. 60 tablet 1  . magnesium oxide (MAG-OX) 400 MG tablet Take 1 tablet by mouth 2 (two) times daily.    . metFORMIN (GLUCOPHAGE) 1000 MG tablet Take 1,000 mg by mouth 2 (two) times daily.    . polyethylene glycol (MIRALAX / GLYCOLAX) 17 g packet Take 17 g by mouth 2 (two) times daily. (Patient taking differently: Take 17 g by mouth as needed. ) 60 each 0   No current facility-administered medications on file prior to visit.    Cardiovascular studies:  EKG 04/09/2019:  Sinus rhythm 76 bpm. Left atrial enlargement.  Possible old anteroseptal infarct. Poor R-wave  progression. Inferolateral T wave inversion, possible ischemia.  CTA head/neck 02/12/2019: 1. Diffuse distal branch vessel irregularity throughout the [place] without a significant proximal stenosis or occlusion. 2. Atherosclerotic changes are evident within the cavernous internal carotid arteries and right greater than left M1 segments with diffuse vessel irregularity and mild narrowing of less than  50% relative to the more distal vessels. 3. Tortuosity of the cervical internal carotid arteries and vertebral arteries bilaterally without significant stenosis. This is nonspecific, but often seen in the setting of chronic hypertension. 4. Brainstem infarct and white matter parenchymal changes noted on MRI are not discernible by CT.  Echocardiogram 02/12/2019:  1. The left ventricle has normal systolic function with an ejection fraction of 60-65%. The cavity size was normal. There is severe concentric left ventricular hypertrophy. Left ventricular diastolic Doppler parameters are consistent with impaired  relaxation.  2. The right ventricle has normal systolic function. The cavity was normal. There is no increase in right ventricular wall thickness.  3. Small pericardial effusion.  4. The mitral valve is abnormal. Mild thickening of the mitral valve leaflet. No evidence of mitral valve stenosis.  5. The aortic valve is grossly normal. Mild thickening of the aortic valve. Mild calcification of the aortic valve. Aortic valve regurgitation is mild by color flow Doppler. No stenosis of the aortic valve.  6. There is a small area of calcification on the [place].  7. The aortic root and ascending aorta are normal in size and structure.  8. The interatrial septum was not assessed.  MRI brain 02/11/2019: 1. Patchy small volume acute ischemic nonhemorrhagic infarct involving the left lateral midbrain/pons. 2. Underlying mild chronic microvascular ischemic disease.  Echocardiogram  07/20/2018: Left ventricle cavity is normal in size. Severe concentric hypertrophy of the left ventricle measuring 2.2 cm. Normal global wall motion. Doppler evidence of grade II (pseudonormal) diastolic dysfunction. Diastolic dysfunction findings suggests elevated LA/LV end diastolic pressure. Visual EF 45-50%. Calculated LVEF 55%. Consider hypertrophic cardiomyopathy/infiltrative vs hypertensive heart disease. Left atrial cavity is severely dilated at 5 cm. Trileaflet aortic valve with mild (Grade I) regurgitation. Mild aortic valve leaflet thickening. Mild (Grade I) mitral regurgitation. Insignificant pericardial effusion.  Recent labs: 03/12/2019: Glucose 170.  BUN/creatinine 26/1.3.  EGFR 57.  NA/K1 39/4.2. H/H 12.6/37.5.  MCV 91.  Platelets 245.  Normal iron studies. Hemoglobin A1c 8.5%. Cholesterol 106, triglycerides 103, HDL 38, LDL 47   Review of Systems  Constitution: Negative for decreased appetite, malaise/fatigue, weight gain and weight loss.  HENT: Negative for congestion.   Eyes: Negative for visual disturbance.  Cardiovascular: Negative for chest pain, dyspnea on exertion, leg swelling, palpitations and syncope.  Respiratory: Negative for cough.   Endocrine: Negative for cold intolerance.  Hematologic/Lymphatic: Does not bruise/bleed easily.  Skin: Negative for itching and rash.  Musculoskeletal: Negative for myalgias.  Gastrointestinal: Negative for abdominal pain, nausea and vomiting.  Genitourinary: Negative for dysuria.  Neurological: Positive for numbness. Negative for dizziness and weakness.       Foot drop  Psychiatric/Behavioral: The patient is not nervous/anxious.   All other systems reviewed and are negative.        Vitals:   08/08/19 1002  BP: (!) 158/89  Pulse: 82  Temp: (!) 97.3 F (36.3 C)  SpO2: 98%     Objective:   Physical Exam  Constitutional: She is oriented to person, place, and time. She appears well-developed and well-nourished.  No distress.  HENT:  Head: Normocephalic and atraumatic.  Eyes: Pupils are equal, round, and reactive to light. Conjunctivae are normal.  Neck: No JVD present.  Cardiovascular: Normal rate, regular rhythm and intact distal pulses.  Murmur heard.  Early systolic murmur is present with a grade of 2/6 at the lower right sternal border. Pulmonary/Chest: Effort normal and breath sounds normal. She has no wheezes. She has no  rales.  Abdominal: Soft. Bowel sounds are normal. There is no rebound.  Musculoskeletal:        General: No edema.  Lymphadenopathy:    She has no cervical adenopathy.  Neurological: She is alert and oriented to person, place, and time. No cranial nerve deficit.  Skin: Skin is warm and dry.  Psychiatric: She has a normal mood and affect.  Nursing note and vitals reviewed.         Assessment & Recommendations:   48 y.o. 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  Hypertension: Uncontrolled.  Strong family history of hypertension. Increased carvedilol to 25 mg bid.  Continue Bidil 20-37.5 mg 1 tab bid, amlodipine, hydralazine, lisinopril-HCTZ.  Left ventricular hypertrophy: Severe.  Seen on 2 separate echocardiograms and 2019 and 2020. Given how difficult to control her blood pressure has been, her LVH is mot likely to be hypertensive in nature. Relatively lower voltage can be explained by her body habitus. hATTR genetic sequencing is negative. I do not think further workup for amyloidosis is necessary.  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  She has completed aspirin and Plavix for 3 weeks after the stroke.  Continue aspirin and statin.  Continue management of risk factors, including blood pressure and diabetes control, as well as statin use. I have encouraged  her to speak with her neurologist regarding her memory issues.   Type 2 diabetes mellitus: Uncontrolled.  Management as per PCP.  F/u in 3 months  [place], MD [facility] Pager: [phone] Office: [phone] If no answer Cell [phone]

## 2019-08-08 ENCOUNTER — Ambulatory Visit (INDEPENDENT_AMBULATORY_CARE_PROVIDER_SITE_OTHER): Payer: Medicaid Other | Admitting: Cardiology

## 2019-08-08 ENCOUNTER — Other Ambulatory Visit: Payer: Self-pay

## 2019-08-08 ENCOUNTER — Encounter: Payer: Self-pay | Admitting: Cardiology

## 2019-08-08 VITALS — BP 158/89 | HR 82 | Temp 97.3°F | Ht 69.0 in | Wt 238.4 lb

## 2019-08-08 DIAGNOSIS — I517 Cardiomegaly: Secondary | ICD-10-CM | POA: Diagnosis not present

## 2019-08-08 DIAGNOSIS — I10 Essential (primary) hypertension: Secondary | ICD-10-CM | POA: Diagnosis not present

## 2019-08-08 DIAGNOSIS — E782 Mixed hyperlipidemia: Secondary | ICD-10-CM

## 2019-08-08 DIAGNOSIS — Z8673 Personal history of transient ischemic attack (TIA), and cerebral infarction without residual deficits: Secondary | ICD-10-CM

## 2019-08-08 DIAGNOSIS — E1165 Type 2 diabetes mellitus with hyperglycemia: Secondary | ICD-10-CM

## 2019-08-08 MED ORDER — CARVEDILOL 25 MG PO TABS: 25.0000 mg | ORAL_TABLET | Freq: Two times a day (BID) | ORAL | 2 refills | Status: DC

## 2019-08-12 ENCOUNTER — Encounter (INDEPENDENT_AMBULATORY_CARE_PROVIDER_SITE_OTHER): Payer: Medicaid Other | Admitting: Ophthalmology

## 2019-08-14 ENCOUNTER — Other Ambulatory Visit: Payer: Self-pay

## 2019-08-14 ENCOUNTER — Ambulatory Visit: Payer: Medicaid Other | Admitting: Neurology

## 2019-08-14 DIAGNOSIS — R410 Disorientation, unspecified: Secondary | ICD-10-CM

## 2019-08-14 DIAGNOSIS — G3184 Mild cognitive impairment, so stated: Secondary | ICD-10-CM

## 2019-08-20 ENCOUNTER — Ambulatory Visit
Admission: RE | Admit: 2019-08-20 | Discharge: 2019-08-20 | Disposition: A | Discharge status: Home / Self Care | Payer: Medicaid Other | Source: Ambulatory Visit | Attending: Neurology | Admitting: Neurology

## 2019-08-20 ENCOUNTER — Other Ambulatory Visit: Payer: Self-pay

## 2019-08-20 ENCOUNTER — Other Ambulatory Visit: Payer: Medicaid Other

## 2019-08-20 DIAGNOSIS — R413 Other amnesia: Secondary | ICD-10-CM | POA: Diagnosis not present

## 2019-08-20 MED ORDER — GADOBENATE DIMEGLUMINE 529 MG/ML IV SOLN
10.0000 mL | Freq: Once | INTRAVENOUS | Status: AC | PRN
  Administered 2019-08-20: 10 mL via INTRAVENOUS

## 2019-08-22 NOTE — Progress Notes (Signed)
I called the patient and gave results of the MRI scan of the brain showing late subacute infarct in the right parasagittal frontal region which may also be contributing to her memory difficulties.  I do not believe further testing is indicated as recent lab work and testing for reversible causes of memory loss was negative.  I recommend continuing antiplatelet therapy and aggressive risk factor modification with strict control of lipids with LDL goal below 70 mg percent and diabetes with hemoglobin A1c goal below 6.5%.  She was advised to see a primary physician Dr. [name] Osei-Bonsu for more aggressive risk factor control.  She voiced understanding

## 2019-08-26 NOTE — Progress Notes (Signed)
[facility] Note  08/28/2019     CHIEF COMPLAINT Patient presents for Retina Follow Up   HISTORY OF PRESENT ILLNESS: [name] is a 49 y.o. female who presents to the clinic today for:   HPI    Retina Follow Up    Patient presents with  Diabetic Retinopathy.  In both eyes.  Severity is moderate.  Duration of 6 weeks.  Since onset it is stable.  I, the attending physician,  performed the HPI with the patient and updated documentation appropriately.          Comments    Patient states had MRI on 01.12.21-revealed may have had another slight stroke. Initial stroke in July 2020. Seems to have had an affect on memory. Vision seems the same OU. BS was 167 this am. Last a1c was 7.0 last month.        Last edited by Bernarda Caffey, MD on 08/28/2019  2:14 PM. (History)    new sub-acute stroke noted on MRI on 01.12.21, affecting right frontal parasagittal and anterior corpus callosum -- pt states she was not having any symptoms so she was surprised at the results, pt states she is not having any new problems with her vision, pt states she has an appt in 3 weeks at her regular eye dr at [facility], pt states her drs are still trying to get her blood pressure to come down  Referring physician: Gevena Cotton, MD [address],  [place] [id number]  HISTORICAL INFORMATION:   Selected notes from the MEDICAL RECORD NUMBER Referred by Dr. Gevena Cotton for concern of PDR OU BCVA 20/30- OD, 20/30- OS   CURRENT MEDICATIONS: No current outpatient medications on file. (Ophthalmic Drugs)   No current facility-administered medications for this visit. (Ophthalmic Drugs)   Current Outpatient Medications (Other)  Medication Sig  . Accu-Chek FastClix Lancets MISC 4 (four) times daily.  Marland Kitchen ACCU-CHEK GUIDE test strip See admin instructions. for testing  . albuterol (VENTOLIN HFA) 108 (90 Base) MCG/ACT inhaler Inhale into the lungs every 6  (six) hours as needed for wheezing or shortness of breath.  Marland Kitchen amLODipine (NORVASC) 10 MG tablet Take 1 tablet (10 mg total) by mouth daily.  Marland Kitchen aspirin EC 81 MG EC tablet Take 1 tablet (81 mg total) by mouth daily.  Marland Kitchen atorvastatin (LIPITOR) 40 MG tablet Take 40 mg by mouth at bedtime.  . blood glucose meter kit and supplies KIT Dispense based on patient and insurance preference. Use up to four times daily as directed. (FOR ICD-9 250.00, 250.01).  . carvedilol (COREG) 25 MG tablet Take 1 tablet (25 mg total) by mouth 2 (two) times daily with a meal.  . Cholecalciferol (VITAMIN D3) 25 MCG (1000 UT) CAPS Take 1 capsule by mouth daily.  . furosemide (LASIX) 20 MG tablet Take 20 mg by mouth 2 (two) times daily.  Marland Kitchen glipiZIDE (GLUCOTROL) 10 MG tablet Take 10 mg by mouth daily.  . hydrOXYzine (ATARAX/VISTARIL) 25 MG tablet Take 25 mg by mouth at bedtime.  . insulin aspart protamine- aspart (NOVOLOG MIX 70/30) (70-30) 100 UNIT/ML injection Inject 0.15 mLs (15 Units total) into the skin 2 (two) times daily with a meal. (Patient taking differently: Inject into the skin. 15 units in the am,17 in the evening)  . Insulin Syringes, Disposable, X-735 1 ML MISC 1 application by Does not apply route 2 (two) times a day.  . isosorbide-hydrALAZINE (BIDIL) 20-37.5 MG  tablet Take 1 tablet by mouth 3 (three) times daily.  Marland Kitchen KLOR-CON M10 10 MEQ tablet Take 10 mEq by mouth 2 (two) times daily.  Marland Kitchen lisinopril-hydrochlorothiazide (ZESTORETIC) 20-12.5 MG tablet Take 2 tablets by mouth daily.  . magnesium oxide (MAG-OX) 400 (241.3 Mg) MG tablet Take 1 tablet (400 mg total) by mouth 2 (two) times daily.  . magnesium oxide (MAG-OX) 400 MG tablet Take 1 tablet by mouth 2 (two) times daily.  . metFORMIN (GLUCOPHAGE) 1000 MG tablet Take 1,000 mg by mouth 2 (two) times daily.  . polyethylene glycol (MIRALAX / GLYCOLAX) 17 g packet Take 17 g by mouth 2 (two) times daily. (Patient taking differently: Take 17 g by mouth as needed. )    No current facility-administered medications for this visit. (Other)      REVIEW OF SYSTEMS: ROS    Positive for: Neurological, Endocrine, Eyes, Respiratory   Negative for: Constitutional, Gastrointestinal, Skin, Genitourinary, Musculoskeletal, HENT, Cardiovascular, Psychiatric, Allergic/Imm, Heme/Lymph   Last edited by Roselee Nova D, COT on 08/28/2019  1:46 PM. (History)       ALLERGIES No Known Allergies  PAST MEDICAL HISTORY Past Medical History:  Diagnosis Date  . Arthritis   . Asthma   . CKD (chronic kidney disease)   . Coronary artery disease   . Diabetes mellitus   . Hyperlipidemia   . Hypertension   . Left thyroid nodule    diagnosed 07/2018  . Pseudotumor cerebri   . Stroke ([place])   . Vitamin D deficiency    Past Surgical History:  Procedure Laterality Date  . ACHILLES TENDON REPAIR      FAMILY HISTORY Family History  Problem Relation Age of Onset  . Asthma Mother   . Hypertension Father   . Kidney disease Father   . Asthma Other   . Hyperlipidemia Other   . Hypertension Other   . Cancer Other   . Diabetes Maternal Grandmother   . Congestive Heart Failure Maternal Grandmother   . Hypertension Sister   . Asthma Sister   . Congestive Heart Failure Maternal Aunt     SOCIAL HISTORY Social History   Tobacco Use  . Smoking status: Former Smoker    Packs/day: 0.50    Years: 30.00    Pack years: 15.00    Types: Cigarettes    Quit date: 2017    Years since quitting: 4.0  . Smokeless tobacco: Former Systems developer    Quit date: 03/31/2016  Substance Use Topics  . Alcohol use: Yes    Alcohol/week: 0.0 standard drinks    Comment: Occassional   . Drug use: Yes    Types: Marijuana    Comment: Occassional          OPHTHALMIC EXAM:  Base Eye Exam    Visual Acuity (Snellen - Linear)      Right Left   Dist cc 20/50 20/50   Dist ph cc NI NI   Correction: Glasses       Tonometry (Tonopen, 2:04 PM)      Right Left   Pressure 19 18        Pupils      Dark Light Shape React APD   Right 3 2.5 Round Minimal None   Left 3.5 3 Round Minimal None       Visual Fields (Counting fingers)      Left Right    Full Full       Extraocular Movement      Right Left  Full, Ortho Full, Ortho       Neuro/Psych    Oriented x3: Yes   Mood/Affect: Normal       Dilation    Both eyes: 1.0% Mydriacyl, 2.5% Phenylephrine @ 2:04 PM        Slit Lamp and Fundus Exam    Slit Lamp Exam      Right Left   Lids/Lashes Mild Meibomian gland dysfunction Mild Meibomian gland dysfunction   Conjunctiva/Sclera Melanosis Melanosis   Cornea Focal NV and pigment at 0530 limbus Trace Punctate epithelial erosions   Anterior Chamber Deep and clear, narrow temporal angle Deep and clear, narrow temporal angle   Iris Round and moderately dilated to 58m, No NVI Round and moderately dilated to 618m No NVI   Lens 1-2+ Nuclear sclerosis, 1-2+ Cortical cataract 1-2+ Nuclear sclerosis, 1-2+ Cortical cataract   Vitreous Mild Vitreous syneresis Mild Vitreous syneresis       Fundus Exam      Right Left   Disc Pink and Sharp Pink and Sharp, mild tilt, mild temporal PPA   C/D Ratio 0.3 0.3   Macula Good foveal reflex, scattered CWS -- improved, MA and IRH Blunted foveal reflex, scattered MA and exudate, scattered CWS greatest inferiorly   Vessels Vascular attenuation, Tortuous, mild AV crossing changes Vascular attenuation, Tortuous, mild AV crossing changes   Periphery Attached, patches of IRH / DBH / CWS -- mostly posterior, early flat NV SN to disc, focal pigmented CR scar at 1030 Attached, scattered IRH and CWS greatest posteriorly        Refraction    Wearing Rx      Sphere Cylinder Axis   Right -1.00 +2.00 026   Left -1.25 +0.50 148   Type: SVL       Manifest Refraction      Dist VA   Right 20/50   Left 20/40          IMAGING AND PROCEDURES  Imaging and Procedures for '@TODAY'$ @  OCT, Retina - OU - Both Eyes       Right Eye Quality  was good. Central Foveal Thickness: 227. Progression has been stable. Findings include normal foveal contour, no SRF, intraretinal hyper-reflective material, intraretinal fluid (Scattered, non-central cystic changes -- stable from prior).   Left Eye Quality was good. Central Foveal Thickness: 251. Progression has improved. Findings include abnormal foveal contour, intraretinal fluid, intraretinal hyper-reflective material, no SRF (Mild interval improvement in non-central cystic changes; inner retinal atrophy nasal macula).   Notes *Images captured and stored on drive  Diagnosis / Impression:  OD: NFP, no SRF; Scattered, non-central cystic changes -- stable from prior OS: Mild IRF / DME and retinal atrophy inf nasal macula -- Mild interval improvement in non-central cystic changes; inner retinal atrophy nasal macula  Clinical management:  See below  Abbreviations: NFP - Normal foveal profile. CME - cystoid macular edema. PED - pigment epithelial detachment. IRF - intraretinal fluid. SRF - subretinal fluid. EZ - ellipsoid zone. ERM - epiretinal membrane. ORA - outer retinal atrophy. ORT - outer retinal tubulation. SRHM - subretinal hyper-reflective material                 ASSESSMENT/PLAN:    ICD-10-CM   1. Severe nonproliferative diabetic retinopathy of both eyes with macular edema associated with type 2 diabetes mellitus ([place] [id number] 2. Retinal edema  H35.81 OCT, Retina - OU - Both Eyes  3. Essential hypertension  I10   4. Combined forms of  age-related cataract of both eyes  H25.813   5. History of stroke  Z86.73   6. Hypertensive retinopathy of both eyes  H35.033     1,2. 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  - exam shows scattered IRH and CWS OU  - OCT with mild DME OS, interval improvement in cystic changes OD  - FA (11.02.20) shows no frank NV, but late leaking MA and capillary nonperfusion OU  - discussed findings and prognosis  - cleared from  retina standpoint for regular appt with Dr. Frederico Hamman   - f/u here in 3 months -- DFE/OCT/FA (optos, transit OS), consider focal laser  3-5. Hypertensive retinopathy OU with history of recent stroke  - stroke July 2020 -- ischemic non-hemorrhagic infarct involving L lateral midbrain/pons  - BP in office on 11.23.2020 was  209/112 (left arm)  - today 1.20.21, bp 160/87  - repeat MRI brain done 1.12.2, which showed new subacute infarction R frontal parasagittal and anterior coprus callosum  - pt reports recent changes in BP meds  - discussed importance of tight BP control  - monitor  6. 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  - discussed diagnosis and progression  - not yet visually significant  - monitor for now   Ophthalmic Meds Ordered this visit:  No orders of the defined types were placed in this encounter.      Return in about 3 months (around 11/26/2019) for f/u NPDR OU, DFE, OCT, FA (optos, transit OS).  There are no Patient Instructions on file for this visit.   Explained the diagnoses, plan, and follow up with the patient and they expressed understanding.  Patient expressed understanding of the importance of proper follow up care.   This document serves as a record of services personally performed by Gardiner Sleeper, MD, PhD. It was created on their behalf by Roselee Nova, COMT. The creation of this record is the provider's dictation and/or activities during the visit.  Electronically signed by: Roselee Nova, COMT 09/01/19 2:45 AM   This document serves as a record of services personally performed by Gardiner Sleeper, MD, PhD. It was created on their behalf by Ernest Mallick, OA, an ophthalmic assistant. The creation of this record is the provider's dictation and/or activities during the visit.    Electronically signed by: Ernest Mallick, OA 01.20.2021 2:45 AM  Gardiner Sleeper,  M.D., Ph.D. Diseases & Surgery of the Retina and [place] 08/28/2019   I have reviewed the above documentation for accuracy and completeness, and I agree with the above. Gardiner Sleeper, M.D., Ph.D. 09/01/19 2:45 AM   Abbreviations: M myopia (nearsighted); A astigmatism; H hyperopia (farsighted); P presbyopia; Mrx spectacle prescription;  CTL contact lenses; OD right eye; OS left eye; OU both eyes  XT exotropia; ET esotropia; PEK punctate epithelial keratitis; PEE punctate epithelial erosions; DES dry eye syndrome; MGD meibomian gland dysfunction; ATs artificial tears; PFAT's preservative free artificial tears; [place] nuclear sclerotic cataract; PSC posterior subcapsular cataract; ERM epi-retinal membrane; PVD posterior vitreous detachment; RD retinal detachment; DM diabetes mellitus; DR diabetic retinopathy; NPDR non-proliferative diabetic retinopathy; PDR proliferative diabetic retinopathy; CSME clinically significant macular edema; DME diabetic macular edema; dbh dot blot hemorrhages; CWS cotton wool spot; POAG primary open angle glaucoma; C/D cup-to-disc ratio; HVF humphrey visual field; GVF goldmann visual field; OCT optical coherence tomography; IOP intraocular pressure; BRVO Branch  retinal vein occlusion; CRVO central retinal vein occlusion; CRAO central retinal artery occlusion; BRAO branch retinal artery occlusion; RT retinal tear; SB scleral buckle; PPV pars plana vitrectomy; VH Vitreous hemorrhage; PRP panretinal laser photocoagulation; IVK intravitreal kenalog; VMT vitreomacular traction; MH Macular hole;  NVD neovascularization of the disc; NVE neovascularization elsewhere; AREDS age related eye disease study; ARMD age related macular degeneration; POAG primary open angle glaucoma; EBMD epithelial/anterior basement membrane dystrophy; ACIOL anterior chamber intraocular lens; IOL intraocular lens; PCIOL posterior chamber intraocular lens; Phaco/IOL  phacoemulsification with intraocular lens placement; [place] photorefractive keratectomy; LASIK laser assisted in situ keratomileusis; HTN hypertension; DM diabetes mellitus; COPD chronic obstructive pulmonary disease

## 2019-08-28 ENCOUNTER — Encounter (INDEPENDENT_AMBULATORY_CARE_PROVIDER_SITE_OTHER): Payer: Self-pay | Admitting: Ophthalmology

## 2019-08-28 ENCOUNTER — Ambulatory Visit (INDEPENDENT_AMBULATORY_CARE_PROVIDER_SITE_OTHER): Payer: Medicaid Other | Admitting: Ophthalmology

## 2019-08-28 ENCOUNTER — Other Ambulatory Visit: Payer: Self-pay

## 2019-08-28 VITALS — BP 160/87 | HR 81

## 2019-08-28 DIAGNOSIS — I10 Essential (primary) hypertension: Secondary | ICD-10-CM

## 2019-08-28 DIAGNOSIS — E113413 Type 2 diabetes mellitus with severe nonproliferative diabetic retinopathy with macular edema, bilateral: Secondary | ICD-10-CM

## 2019-08-28 DIAGNOSIS — H3581 Retinal edema: Secondary | ICD-10-CM

## 2019-08-28 DIAGNOSIS — H35033 Hypertensive retinopathy, bilateral: Secondary | ICD-10-CM

## 2019-08-28 DIAGNOSIS — H25813 Combined forms of age-related cataract, bilateral: Secondary | ICD-10-CM

## 2019-08-28 DIAGNOSIS — Z8673 Personal history of transient ischemic attack (TIA), and cerebral infarction without residual deficits: Secondary | ICD-10-CM

## 2019-09-16 ENCOUNTER — Other Ambulatory Visit: Payer: Self-pay

## 2019-09-16 ENCOUNTER — Encounter: Payer: Self-pay | Admitting: Neurology

## 2019-09-16 ENCOUNTER — Ambulatory Visit: Payer: Medicaid Other | Admitting: Neurology

## 2019-09-16 ENCOUNTER — Telehealth: Payer: Self-pay | Admitting: Neurology

## 2019-09-16 VITALS — BP 151/80 | HR 77 | Temp 97.0°F | Wt 249.0 lb

## 2019-09-16 DIAGNOSIS — G3184 Mild cognitive impairment, so stated: Secondary | ICD-10-CM

## 2019-09-16 DIAGNOSIS — I63521 Cerebral infarction due to unspecified occlusion or stenosis of right anterior cerebral artery: Secondary | ICD-10-CM

## 2019-09-16 DIAGNOSIS — G44209 Tension-type headache, unspecified, not intractable: Secondary | ICD-10-CM | POA: Diagnosis not present

## 2019-09-16 NOTE — Progress Notes (Signed)
[facility] [address] [place]. [place] [address] [phone]       OFFICE FOLLOW-UP NOTE  [name] Date of Birth:  17-Aug-1970 Medical Record Number:  [id number]   HPI: Initial video visit 07/24/2019 : [name] is a 49 year old African-American lady seen today for initial virtual video consultation visit for stroke and memory loss.  History is obtained from the patient, review of electronic medical records and referral notes and I personally reviewed imaging films in PACS.  She developed sudden onset of facial numbness as well as right-sided weakness and gait difficulty on 02/12/2019 and was admitted to [facility].  She presented beyond time window for thrombolysis.  MRI scan of the brain showed a small midbrain and brainstem lacunar infarct.  CT angiogram of the brain and neck both did not reveal significant large vessel stenosis or occlusion.  Transthoracic echo showed normal ejection fraction.  Hemoglobin A1c was elevated at 8.8 and LDL cholesterol was 57 mg percent.  Patient is started on dual antiplatelet therapy and advised aggressive risk factor modification and treatment for sleep apnea with CPAP.  Patient improved gradually with physical occupational therapy was initially walking with a cane and now she can walk independently.  She still has some mild residual right facial numbness but right-sided strength has improved.  She walks independently indoors and short distances and uses cane only for long distances.  The patient has not had any recurrent stroke or TIA symptoms since then.  She states her blood pressure is well controlled and sugars are also doing better.  She however has not been able to tolerate her CPAP ever since Covid pandemic began and has not seen a medical doctor for troubleshooting this.  She however is concerned today about memory and cognitive difficulties that she has been having ever since the stroke.  She states that she has  trouble finding words and at times completing sentences.  She may remember the words later on.  She denies feeling depressed or anxious.  She denies headache or any other new neurological symptoms.  She is still independent and manages all her affairs.  There is strong family history of multiple family members with memory problems as well as strokes.  There is no history of significant head injury with loss of consciousness, seizures or drug abuse.  She has not had any recent work-up for her memory loss issues on her brain imaging study Update 09/16/2019 : She returns for follow-up after last video visit 2 months ago.  She continues to have short-term memory difficulties but feels its not as bad.  She has learned to strategize and write things down which seems to be helping.  At last visit she had complained of new memory issues & obtained lab work for reversible causes of memory loss and vitamin B12, TSH and RPR were negative on 07/31/2019.Marland Kitchen  EEG on 08/19/2019 was normal.  MRI scan of the brain on 08/21/2019 shows a faintly enhancing right corpus callosal and parasagittal frontal subacute infarct.  Patient had previously in July 2020 had extensive stroke work-up which was negative.  Patient states she has since started using CPAP regularly after she saw her primary care physician recently.  She has been having problems with constipation and has also had alternate watery diarrhea and she is seeing her primary care physician for the same.  She remains on aspirin for stroke prevention which she is tolerating well without bruising or bleeding.  She is tolerating Lipitor  well without muscle aches and pains.  Her blood pressure is usually better controlled to slightly elevated in office today.   ROS:   14 system review of systems is positive for  Headache, stress, memory impairment, disturbed sleep, weakness and all other systems negative PMH:  Past Medical History:  Diagnosis Date  . Arthritis   . Asthma   . CKD  (chronic kidney disease)   . Coronary artery disease   . Diabetes mellitus   . Hyperlipidemia   . Hypertension   . Left thyroid nodule    diagnosed 07/2018  . Pseudotumor cerebri   . Stroke ([place])   . Vitamin D deficiency     Social History:  Social History   Socioeconomic History  . Marital status: Single    Spouse name: Not on file  . Number of children: 4  . Years of education: Not on file  . Highest education level: Not on file  Occupational History  . Not on file  Tobacco Use  . Smoking status: Former Smoker    Packs/day: 0.50    Years: 30.00    Pack years: 15.00    Types: Cigarettes    Quit date: 2017    Years since quitting: 4.1  . Smokeless tobacco: Former Systems developer    Quit date: 03/31/2016  Substance and Sexual Activity  . Alcohol use: Yes    Alcohol/week: 0.0 standard drinks    Comment: Occassional   . Drug use: Yes    Types: Marijuana    Comment: Occassional   . Sexual activity: Yes    Partners: Male    Birth control/protection: Surgical  Other Topics Concern  . Not on file  Social History Narrative  . Not on file   Social Determinants of Health   Financial Resource Strain:   . Difficulty of Paying Living Expenses: Not on file  Food Insecurity:   . Worried About Charity fundraiser in the Last Year: Not on file  . Ran Out of Food in the Last Year: Not on file  Transportation Needs:   . Lack of Transportation (Medical): Not on file  . Lack of Transportation (Non-Medical): Not on file  Physical Activity:   . Days of Exercise per Week: Not on file  . Minutes of Exercise per Session: Not on file  Stress:   . Feeling of Stress : Not on file  Social Connections:   . Frequency of Communication with Friends and Family: Not on file  . Frequency of Social Gatherings with Friends and Family: Not on file  . Attends Religious Services: Not on file  . Active Member of Clubs or Organizations: Not on file  . Attends Archivist Meetings: Not on file    . Marital Status: Not on file  Intimate Partner Violence:   . Fear of Current or Ex-Partner: Not on file  . Emotionally Abused: Not on file  . Physically Abused: Not on file  . Sexually Abused: Not on file    Medications:   Current Outpatient Medications on File Prior to Visit  Medication Sig Dispense Refill  . Accu-Chek FastClix Lancets MISC 4 (four) times daily.    Marland Kitchen ACCU-CHEK GUIDE test strip See admin instructions. for testing    . albuterol (VENTOLIN HFA) 108 (90 Base) MCG/ACT inhaler Inhale into the lungs every 6 (six) hours as needed for wheezing or shortness of breath.    Marland Kitchen amLODipine (NORVASC) 10 MG tablet Take 1 tablet (10 mg total) by mouth  daily. 30 tablet 1  . aspirin EC 81 MG EC tablet Take 1 tablet (81 mg total) by mouth daily.    Marland Kitchen atorvastatin (LIPITOR) 40 MG tablet Take 40 mg by mouth at bedtime.    . blood glucose meter kit and supplies KIT Dispense based on patient and insurance preference. Use up to four times daily as directed. (FOR ICD-9 250.00, 250.01). 1 each 0  . carvedilol (COREG) 25 MG tablet Take 1 tablet (25 mg total) by mouth 2 (two) times daily with a meal. 180 tablet 2  . Cholecalciferol (VITAMIN D3) 25 MCG (1000 UT) CAPS Take 1 capsule by mouth daily.    . furosemide (LASIX) 20 MG tablet Take 20 mg by mouth 2 (two) times daily.    Marland Kitchen glipiZIDE (GLUCOTROL) 10 MG tablet Take 10 mg by mouth daily.    . hydrOXYzine (ATARAX/VISTARIL) 25 MG tablet Take 25 mg by mouth at bedtime.    . insulin aspart protamine- aspart (NOVOLOG MIX 70/30) (70-30) 100 UNIT/ML injection Inject 0.15 mLs (15 Units total) into the skin 2 (two) times daily with a meal. (Patient taking differently: Inject into the skin. 15 units in the am,17 in the evening) 10 mL 11  . Insulin Syringes, Disposable, E-423 1 ML MISC 1 application by Does not apply route 2 (two) times a day. 100 each 0  . isosorbide-hydrALAZINE (BIDIL) 20-37.5 MG tablet Take 1 tablet by mouth 3 (three) times daily. 60 tablet  1  . KLOR-CON M10 10 MEQ tablet Take 10 mEq by mouth 2 (two) times daily.    Marland Kitchen lisinopril-hydrochlorothiazide (ZESTORETIC) 20-12.5 MG tablet Take 2 tablets by mouth daily.    . magnesium oxide (MAG-OX) 400 (241.3 Mg) MG tablet Take 1 tablet (400 mg total) by mouth 2 (two) times daily. 60 tablet 1  . magnesium oxide (MAG-OX) 400 MG tablet Take 1 tablet by mouth 2 (two) times daily.    . metFORMIN (GLUCOPHAGE) 1000 MG tablet Take 1,000 mg by mouth 2 (two) times daily.    . polyethylene glycol (MIRALAX / GLYCOLAX) 17 g packet Take 17 g by mouth 2 (two) times daily. (Patient taking differently: Take 17 g by mouth as needed. ) 60 each 0   No current facility-administered medications on file prior to visit.    Allergies:  No Known Allergies  Physical Exam General: Obese middle-aged African-American lady seated, in no evident distress Head: head normocephalic and atraumatic.  Neck: supple with no carotid or supraclavicular bruits Cardiovascular: regular rate and rhythm, no murmurs Musculoskeletal: no deformity Skin:  no rash/petichiae Vascular:  Normal pulses all extremities Vitals:   09/16/19 1010  BP: (!) 151/80  Pulse: 77  Temp: (!) 97 F (36.1 C)   Neurologic Exam Mental Status: Awake and fully alert. Oriented to place and time. Recent and remote memory intact. Attention span, concentration and fund of knowledge appropriate. Mood and affect appropriate.  Diminished recall 1/3.  Reticulocyte depression scale score 13 suggestive of moderate depression.  Mini-Mental status exam score 24/30 with deficits in registration, attention calculation and recall.  She was able to name only 6 animals which walk on 4 legs.  Clock drawing score was 3/4. Cranial Nerves: Fundoscopic exam reveals sharp disc margins. Pupils equal, briskly reactive to light. Extraocular movements full without nystagmus. Visual fields full to confrontation. Hearing intact. Facial sensation intact. Face, tongue, palate moves  normally and symmetrically.  Motor: Normal bulk and tone. Normal strength in all tested extremity muscles. Sensory.: intact to touch ,  pinprick .position and vibratory sensation.  Coordination: Rapid alternating movements normal in all extremities. Finger-to-nose and heel-to-shin performed accurately bilaterally. Gait and Station: Arises from chair without difficulty. Stance is normal. Gait demonstrates normal stride length and balance . Able to heel, toe and tandem walk with moderate difficulty.  Reflexes: 1+ and symmetric. Toes downgoing.   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 2. New complaints of memory difficulties likely due to mild cognitive impairment.  Evaluation for reversible etiology has been unremarkable except that she had a interval new right corpus callosal and ACA infarct of cryptogenic etiology which could be contributing. 3. She also has underlying depression which could also be contributing and she sees a counselor for the same.4.  Mild tension headaches which appear not to be disabling     PLAN: I had a long d/w patient about her recent silent stroke,mild memory impairment, risk for recurrent stroke/TIAs, personally independently reviewed imaging studies and stroke evaluation results and answered questions.Continue aspirin 81 mg daily  for secondary stroke prevention and maintain strict control of hypertension with blood pressure goal below 130/90, diabetes with hemoglobin A1c goal below 6.5% and lipids with LDL cholesterol goal below 70 mg/dL. I also advised the patient to eat a healthy diet with plenty of whole grains, cereals, fruits and vegetables, exercise regularly and maintain ideal body weight .  I recommend we check 30-day heart  monitor for paroxysmal A. fib as well as transcranial Doppler bubble study for PFO and CT angiogram of the brain and neck for significant stenosis.  We discussed memory compensation strategies and participate in cognitively challenging activities like solving crossword puzzles, playing bridge and sodoku.  I also advised her to participate in stress laxation activities like meditation and yoga to help with the tension headaches and do neck stretching exercises.  Patient was also counseled to see her counselor for her depression and stated she was willing.  Followup in the future with me in 6 months or call earlier if necessary. Greater than 50% of time during this prolonged 60 minute visit was spent on counseling,explanation of diagnosis of cryptogenic stroke, mild cognitive impairment, tension headache and depression and answering questions planning of further management, discussion with patient and family and coordination of care Teresa Contras, MD  [facility] [address] [place], [place] [address]  Phone [phone] Fax [phone] Note: This document was prepared with digital dictation and possible smart phrase technology. Any transcriptional errors that result from this process are unintentional

## 2019-09-16 NOTE — Patient Instructions (Addendum)
I had a long d/w patient about her recent silent stroke,mild memory impairment, risk for recurrent stroke/TIAs, personally independently reviewed imaging studies and stroke evaluation results and answered questions.Continue aspirin 81 mg daily  for secondary stroke prevention and maintain strict control of hypertension with blood pressure goal below 130/90, diabetes with hemoglobin A1c goal below 6.5% and lipids with LDL cholesterol goal below 70 mg/dL. I also advised the patient to eat a healthy diet with plenty of whole grains, cereals, fruits and vegetables, exercise regularly and maintain ideal body weight .  I recommend we check 30-day heart monitor for paroxysmal A. fib as well as transcranial Doppler bubble study for PFO and CT angiogram of the brain and neck for significant stenosis.  We discussed memory compensation strategies and participate in cognitively challenging activities like solving crossword puzzles, playing bridge and sodoku.  I also advised her to participate in stress laxation activities like meditation and yoga to help with the tension headaches and do neck stretching exercises.  Patient was also counseled to see her counselor for her depression and stated she was willing.  Followup in the future with me in 6 months or call earlier if necessary.  Memory Compensation Strategies  1. Use "WARM" strategy.  W= write it down  A= associate it  R= repeat it  M= make a mental note  2.   You can keep a Social worker.  Use a 3-ring notebook with sections for the following: calendar, important names and phone numbers,  medications, doctors' names/phone numbers, lists/reminders, and a section to journal what you did  each day.   3.    Use a calendar to write appointments down.  4.    Write yourself a schedule for the day.  This can be placed on the calendar or in a separate section of the Memory Notebook.  Keeping a  regular schedule can help memory.  5.    Use medication organizer  with sections for each day or morning/evening pills.  You may need help loading it  6.    Keep a basket, or pegboard by the door.  Place items that you need to take out with you in the basket or on the pegboard.  You may also want to  include a message board for reminders.  7.    Use sticky notes.  Place sticky notes with reminders in a place where the task is performed.  For example: " turn off the  stove" placed by the stove, "lock the door" placed on the door at eye level, " take your medications" on  the bathroom mirror or by the place where you normally take your medications.  8.    Use alarms/timers.  Use while cooking to remind yourself to check on food or as a reminder to take your medicine, or as a  reminder to make a call, or as a reminder to perform another task, etc.   Neck Exercises Ask your health care provider which exercises are safe for you. Do exercises exactly as told by your health care provider and adjust them as directed. It is normal to feel mild stretching, pulling, tightness, or discomfort as you do these exercises. Stop right away if you feel sudden pain or your pain gets worse. Do not begin these exercises until told by your health care provider. Neck exercises can be important for many reasons. They can improve strength and maintain flexibility in your neck, which will help your upper back and prevent neck pain.  Stretching exercises Rotation neck stretching  1. Sit in a chair or stand up. 2. Place your feet flat on the floor, shoulder width apart. 3. Slowly turn your head (rotate) to the right until a slight stretch is felt. Turn it all the way to the right so you can look over your right shoulder. Do not tilt or tip your head. 4. Hold this position for 10-30 seconds. 5. Slowly turn your head (rotate) to the left until a slight stretch is felt. Turn it all the way to the left so you can look over your left shoulder. Do not tilt or tip your head. 6. Hold this  position for 10-30 seconds. Repeat __________ times. Complete this exercise __________ times a day. Neck retraction 1. Sit in a sturdy chair or stand up. 2. Look straight ahead. Do not bend your neck. 3. Use your fingers to push your chin backward (retraction). Do not bend your neck for this movement. Continue to face straight ahead. If you are doing the exercise properly, you will feel a slight sensation in your throat and a stretch at the back of your neck. 4. Hold the stretch for 1-2 seconds. Repeat __________ times. Complete this exercise __________ times a day. Strengthening exercises Neck press 1. Lie on your back on a firm bed or on the floor with a pillow under your head. 2. Use your neck muscles to push your head down on the pillow and straighten your spine. 3. Hold the position as well as you can. Keep your head facing up (in a neutral position) and your chin tucked. 4. Slowly count to 5 while holding this position. Repeat __________ times. Complete this exercise __________ times a day. Isometrics These are exercises in which you strengthen the muscles in your neck while keeping your neck still (isometrics). 1. Sit in a supportive chair and place your hand on your forehead. 2. Keep your head and face facing straight ahead. Do not flex or extend your neck while doing isometrics. 3. Push forward with your head and neck while pushing back with your hand. Hold for 10 seconds. 4. Do the sequence again, this time putting your hand against the back of your head. Use your head and neck to push backward against the hand pressure. 5. Finally, do the same exercise on either side of your head, pushing sideways against the pressure of your hand. Repeat __________ times. Complete this exercise __________ times a day. Prone head lifts 1. Lie face-down (prone position), resting on your elbows so that your chest and upper back are raised. 2. Start with your head facing downward, near your chest.  Position your chin either on or near your chest. 3. Slowly lift your head upward. Lift until you are looking straight ahead. Then continue lifting your head as far back as you can comfortably stretch. 4. Hold your head up for 5 seconds. Then slowly lower it to your starting position. Repeat __________ times. Complete this exercise __________ times a day. Supine head lifts 1. Lie on your back (supine position), bending your knees to point to the ceiling and keeping your feet flat on the floor. 2. Lift your head slowly off the floor, raising your chin toward your chest. 3. Hold for 5 seconds. Repeat __________ times. Complete this exercise __________ times a day. Scapular retraction 1. Stand with your arms at your sides. Look straight ahead. 2. Slowly pull both shoulders (scapulae) backward and downward (retraction) until you feel a stretch between your shoulder blades in  your upper back. 3. Hold for 10-30 seconds. 4. Relax and repeat. Repeat __________ times. Complete this exercise __________ times a day. Contact a health care provider if:  Your neck pain or discomfort gets much worse when you do an exercise.  Your neck pain or discomfort does not improve within 2 hours after you exercise. If you have any of these problems, stop exercising right away. Do not do the exercises again unless your health care provider says that you can. Get help right away if:  You develop sudden, severe neck pain. If this happens, stop exercising right away. Do not do the exercises again unless your health care provider says that you can. This information is not intended to replace advice given to you by your health care provider. Make sure you discuss any questions you have with your health care provider. Document Revised: 05/23/2018 Document Reviewed: 05/23/2018 Elsevier Patient Education  [place].

## 2019-09-16 NOTE — Telephone Encounter (Signed)
Medicaid order sent to [facility]. They will obtain the auth and reach out to the patient to schedule.  

## 2019-09-17 ENCOUNTER — Other Ambulatory Visit: Payer: Self-pay

## 2019-09-17 ENCOUNTER — Other Ambulatory Visit: Payer: Self-pay | Admitting: Physical Medicine and Rehabilitation

## 2019-09-17 ENCOUNTER — Ambulatory Visit (INDEPENDENT_AMBULATORY_CARE_PROVIDER_SITE_OTHER): Payer: Medicaid Other | Admitting: Cardiology

## 2019-09-17 ENCOUNTER — Ambulatory Visit: Payer: Medicaid Other

## 2019-09-17 ENCOUNTER — Encounter: Payer: Self-pay | Admitting: Cardiology

## 2019-09-17 VITALS — BP 172/86 | HR 78 | Temp 98.0°F | Ht 69.0 in | Wt 249.4 lb

## 2019-09-17 DIAGNOSIS — I3139 Other pericardial effusion (noninflammatory): Secondary | ICD-10-CM

## 2019-09-17 DIAGNOSIS — I6350 Cerebral infarction due to unspecified occlusion or stenosis of unspecified cerebral artery: Secondary | ICD-10-CM | POA: Diagnosis not present

## 2019-09-17 DIAGNOSIS — I639 Cerebral infarction, unspecified: Secondary | ICD-10-CM

## 2019-09-17 DIAGNOSIS — I313 Pericardial effusion (noninflammatory): Secondary | ICD-10-CM

## 2019-09-17 DIAGNOSIS — I10 Essential (primary) hypertension: Secondary | ICD-10-CM

## 2019-09-17 DIAGNOSIS — I517 Cardiomegaly: Secondary | ICD-10-CM | POA: Diagnosis not present

## 2019-09-17 MED ORDER — BIDIL 20-37.5 MG PO TABS: 1.0000 | ORAL_TABLET | Freq: Three times a day (TID) | ORAL | 3 refills | Status: DC

## 2019-09-17 MED ORDER — HYDROCHLOROTHIAZIDE 25 MG PO TABS: 25.0000 mg | ORAL_TABLET | Freq: Every day | ORAL | 3 refills | Status: DC

## 2019-09-17 MED ORDER — ROSUVASTATIN CALCIUM 10 MG PO TABS: 10.0000 mg | ORAL_TABLET | Freq: Every day | ORAL | 3 refills | Status: DC

## 2019-09-17 NOTE — Progress Notes (Signed)
Patient referred by [name], MD for hypertensive heart disease  Subjective:   [name], female    DOB: [birth year], 49 y.o.   MRN: [id number]   Chief Complaint  Patient presents with  . Cerebrovascular Accident    post  . Follow-up    49 y.o. African-American female with hypertension, uncontrolled type 2 diabetes mellitus, hyperlipidemia, tobacco abuse, recurrent strokes.  Patient recently saw her neurologist Dr. Leonie Man, who discussed with her regarding  late subacute infarct in the right parasagittal frontal region, likely causing memory deficits.   He recommended 30-day event monitor to evaluate for paroxysmal A. fib, as well as transcranial Doppler bubble study for PFO and CT angiogram for her brain and neck.  In the meantime, her blood pressure remains uncontrolled.  She continues to have stable mild exertional dyspnea.  She denies any leg swelling.  She is recently started using CPAP for sleep apnea. There is a lot of discrepancy between her medications listed with [place], but she is currently on.    Current Outpatient Medications on File Prior to Visit  Medication Sig Dispense Refill  . Accu-Chek FastClix Lancets MISC 4 (four) times daily.    Marland Kitchen ACCU-CHEK GUIDE test strip See admin instructions. for testing    . albuterol (VENTOLIN HFA) 108 (90 Base) MCG/ACT inhaler Inhale into the lungs every 6 (six) hours as needed for wheezing or shortness of breath.    Marland Kitchen amLODipine-benazepril (LOTREL) 10-40 MG capsule Take 1 capsule by mouth daily.    Marland Kitchen aspirin EC 81 MG EC tablet Take 1 tablet (81 mg total) by mouth daily.    . blood glucose meter kit and supplies KIT Dispense based on patient and insurance preference. Use up to four times daily as directed. (FOR ICD-9 250.00, 250.01). 1 each 0  . carvedilol (COREG) 25 MG tablet Take 1 tablet (25 mg total) by mouth 2 (two) times daily with a meal. 180 tablet 2  . Cholecalciferol (VITAMIN D3) 25 MCG (1000 UT) CAPS Take 1  capsule by mouth daily.    . fenofibrate (TRICOR) 145 MG tablet Take 145 mg by mouth daily.    . folic acid (FOLVITE) 1 MG tablet Take 1 mg by mouth daily.    . furosemide (LASIX) 20 MG tablet Take 20 mg by mouth 2 (two) times daily.    Marland Kitchen glipiZIDE (GLUCOTROL) 10 MG tablet Take 10 mg by mouth daily.    . hydrochlorothiazide (MICROZIDE) 12.5 MG capsule Take 12.5 mg by mouth daily.    . hydrOXYzine (ATARAX/VISTARIL) 25 MG tablet Take 25 mg by mouth at bedtime.    . insulin aspart protamine- aspart (NOVOLOG MIX 70/30) (70-30) 100 UNIT/ML injection Inject 0.15 mLs (15 Units total) into the skin 2 (two) times daily with a meal. (Patient taking differently: Inject into the skin. 15 units in the am,17 in the evening) 10 mL 11  . Insulin Syringes, Disposable, P-546 1 ML MISC 1 application by Does not apply route 2 (two) times a day. 100 each 0  . isosorbide-hydrALAZINE (BIDIL) 20-37.5 MG tablet Take 1 tablet by mouth 3 (three) times daily. (Patient taking differently: Take 1 tablet by mouth 2 (two) times daily. ) 60 tablet 1  . metFORMIN (GLUCOPHAGE) 1000 MG tablet Take 1,000 mg by mouth 2 (two) times daily.    . vitamin B-12 (CYANOCOBALAMIN) 1000 MCG tablet Take 1,000 mcg by mouth daily.     No current facility-administered medications on file prior to visit.  Cardiovascular studies:  EKG 09/17/2019: Sinus rhythm 77 bpm. Left atrial enlargement.  Old anteroseptal infarct.  Nonspecific T-abnormality.   MRI brain 08/20/2019: - Right frontal parasagittal and anterior corpus callosum lesion which is hyperintense on DWI, slightly hypointense on ADC map, hyperintense on T2 and FLAIR views, with heterogeneous enhancement.  Considerations could include subacute infarction, autoimmune, inflammatory or neoplastic etiologies. This is a new finding compared to 02/11/2019. - Mild scattered periventricular, subcortical, pontine chronic small vessel ischemic disease.   CTA head/neck 02/12/2019: 1. Diffuse distal  branch vessel irregularity throughout the circle-of-Willis without a significant proximal stenosis or occlusion. 2. Atherosclerotic changes are evident within the cavernous internal carotid arteries and right greater than left M1 segments with diffuse vessel irregularity and mild narrowing of less than 50% relative to the more distal vessels. 3. Tortuosity of the cervical internal carotid arteries and vertebral arteries bilaterally without significant stenosis. This is nonspecific, but often seen in the setting of chronic hypertension. 4. Brainstem infarct and white matter parenchymal changes noted on MRI are not discernible by CT.  Echocardiogram 02/12/2019:  1. The left ventricle has normal systolic function with an ejection fraction of 60-65%. The cavity size was normal. There is severe concentric left ventricular hypertrophy. Left ventricular diastolic Doppler parameters are consistent with impaired  relaxation.  2. The right ventricle has normal systolic function. The cavity was normal. There is no increase in right ventricular wall thickness.  3. Small pericardial effusion.  4. The mitral valve is abnormal. Mild thickening of the mitral valve leaflet. No evidence of mitral valve stenosis.  5. The aortic valve is grossly normal. Mild thickening of the aortic valve. Mild calcification of the aortic valve. Aortic valve regurgitation is mild by color flow Doppler. No stenosis of the aortic valve.  6. There is a small area of calcification on the [place].  7. The aortic root and ascending aorta are normal in size and structure.  8. The interatrial septum was not assessed.  MRI brain 02/11/2019: 1. Patchy small volume acute ischemic nonhemorrhagic infarct involving the left lateral midbrain/pons. 2. Underlying mild chronic microvascular ischemic disease.  Echocardiogram 07/20/2018: Left ventricle cavity is normal in size. Severe concentric hypertrophy of the left ventricle measuring 2.2 cm.  Normal global wall motion. Doppler evidence of grade II (pseudonormal) diastolic dysfunction. Diastolic dysfunction findings suggests elevated LA/LV end diastolic pressure. Visual EF 45-50%. Calculated LVEF 55%. Consider hypertrophic cardiomyopathy/infiltrative vs hypertensive heart disease. Left atrial cavity is severely dilated at 5 cm. Trileaflet aortic valve with mild (Grade I) regurgitation. Mild aortic valve leaflet thickening. Mild (Grade I) mitral regurgitation. Insignificant pericardial effusion.  Recent labs: 03/12/2019: Glucose 170.  BUN/creatinine 26/1.3.  EGFR 57.  NA/K1 39/4.2. H/H 12.6/37.5.  MCV 91.  Platelets 245.  Normal iron studies. Hemoglobin A1c 8.5%. Cholesterol 106, triglycerides 103, HDL 38, LDL 47   Review of Systems  Cardiovascular: Positive for dyspnea on exertion. Negative for chest pain, leg swelling, palpitations and syncope.  Genitourinary: Negative for dysuria.  Neurological: Positive for numbness.       Foot drop  All other systems reviewed and are negative.        Vitals:   09/17/19 1119 09/17/19 1132  BP: (!) 188/88 (!) 172/86  Pulse: 72 78  Temp: 98 F (36.7 C)   SpO2: 98%      Objective:   Physical Exam  Constitutional: No distress.  HENT:  Head: Normocephalic and atraumatic.  Neck: No JVD present.  Cardiovascular: Normal rate, regular rhythm and  intact distal pulses.  Murmur heard.  Early systolic murmur is present with a grade of 2/6 at the lower right sternal border. Pulmonary/Chest: Effort normal and breath sounds normal. She has no rales.  Musculoskeletal:        General: No edema.  Neurological: No cranial nerve deficit.  Psychiatric: She has a normal mood and affect.  Nursing note and vitals reviewed.         Assessment & Recommendations:   49 y.o. 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  Hypertension: Uncontrolled.  Strong family history of hypertension. Continue carvedilol 25 mg bid, amlodipine-benazepril 10-40 mg daily. Increased Bidil from 1 tab bid to 1 tab tid. Increased HCTZ from 12.5 mg to 25 mg daily.  She is also on lasix 20 mg bid.  She certainly has resistant hypertension and may need workup. She also seen nephrology. I am not sure if she has had any workup with them, including renin/aldostrone and renal artery duplex. I will await records from them prior to performing that workup again.   Left ventricular hypertrophy: Severe.  Seen on 2 separate echocardiograms and 2019 and 2020. Given how difficult to control her blood pressure has been, her LVH is more likely to be hypertensive in nature. Relatively lower voltage can be explained by her body habitus. hATTR genetic sequencing is negative. I do not think further workup for amyloidosis is necessary. Given small pericardial effusion seen on last echocardiogram in 02/12/2019, will obtain a follow up study.   History of stroke: Will place event monitor as recommended by Neurology. TCD will be performed by them. Continue Aspirin. Switched fenofibrate to crestor 10 mg daily.  Type 2 diabetes mellitus: Uncontrolled.  Management as per PCP.  F/u in March 2020  Nigel Mormon, MD [facility] [place] Pager: [phone] Office: [phone] If no answer Cell [phone]

## 2019-09-17 NOTE — Addendum Note (Signed)
Addended by: Nigel Mormon on: 09/17/2019 01:11 PM   Modules accepted: Orders

## 2019-09-17 NOTE — Telephone Encounter (Signed)
Needs to be refilled and managed by patients primary care provider. 

## 2019-09-24 ENCOUNTER — Telehealth: Payer: Self-pay | Admitting: Neurology

## 2019-09-24 NOTE — Telephone Encounter (Signed)
I called [employer] to do the authorization for the doppler. I was unable to get it approved on the nurse level. Fax clinical notes for the MD review. Case # [id number].

## 2019-09-24 NOTE — Telephone Encounter (Signed)
[name] from [facility] called stating that the pt is scheduled tomorrow for the Vascular Ultra Sound and they are needing a pre auth for this procedure. Please advise.

## 2019-09-25 ENCOUNTER — Other Ambulatory Visit: Payer: Self-pay

## 2019-09-25 ENCOUNTER — Ambulatory Visit (HOSPITAL_COMMUNITY)
Admission: RE | Admit: 2019-09-25 | Discharge: 2019-09-25 | Disposition: A | Discharge status: Home / Self Care | Payer: Medicaid Other | Source: Ambulatory Visit | Attending: Neurology | Admitting: Neurology

## 2019-09-25 DIAGNOSIS — I63521 Cerebral infarction due to unspecified occlusion or stenosis of right anterior cerebral artery: Secondary | ICD-10-CM | POA: Diagnosis not present

## 2019-09-25 NOTE — Telephone Encounter (Signed)
[facility] called and said pt is coming in today @1 :00  For a trans cranial doppler ultrasound and they are needing this Josem Kaufmann can reach them at [phone]

## 2019-09-25 NOTE — Telephone Encounter (Signed)
Medicaid Josem KaufmannBN:[id number] (exp. 09/24/19 to 03/22/20) patient had the Doppler done today at 1 pm.

## 2019-09-25 NOTE — Progress Notes (Signed)
TCD bubble study has been completed.   Preliminary results in CV Proc.   [name] 09/25/2019 1:28 PM

## 2019-10-08 ENCOUNTER — Ambulatory Visit
Admission: RE | Admit: 2019-10-08 | Discharge: 2019-10-08 | Disposition: A | Discharge status: Home / Self Care | Payer: Medicaid Other | Source: Ambulatory Visit | Attending: Neurology | Admitting: Neurology

## 2019-10-08 ENCOUNTER — Other Ambulatory Visit: Payer: Self-pay

## 2019-10-08 DIAGNOSIS — I63521 Cerebral infarction due to unspecified occlusion or stenosis of right anterior cerebral artery: Secondary | ICD-10-CM

## 2019-10-08 MED ORDER — IOPAMIDOL (ISOVUE-370) INJECTION 76%
75.0000 mL | Freq: Once | INTRAVENOUS | Status: AC | PRN
  Administered 2019-10-08: 60 mL via INTRAVENOUS

## 2019-10-08 NOTE — Progress Notes (Signed)
Kindly inform the patient that CT angiogram of the brain and neck does not show any major blockages to worry about.  It shows some improvement compared with previous study in July 2020

## 2019-10-09 ENCOUNTER — Telehealth: Payer: Self-pay | Admitting: *Deleted

## 2019-10-09 NOTE — Telephone Encounter (Addendum)
Called pt & LVM asking for call back. When she calls back, please read her Dr. Clydene Fake message below regarding CT angiogram results. Let us know if she has any questions.   ----- Message from Garvin Fila, MD sent at 10/08/2019  5:52 PM EST ----- Kindly inform the patient that CT angiogram of the brain and neck does not show any major blockages to worry about.  It shows some improvement compared with previous study in July 2020

## 2019-10-10 NOTE — Telephone Encounter (Signed)
Pt returned call and was informed. Pt verbalized appreciation for the information.

## 2019-10-10 NOTE — Telephone Encounter (Signed)
Noted thanks °

## 2019-10-16 ENCOUNTER — Ambulatory Visit: Payer: Medicaid Other

## 2019-10-16 ENCOUNTER — Other Ambulatory Visit: Payer: Self-pay

## 2019-10-16 DIAGNOSIS — I10 Essential (primary) hypertension: Secondary | ICD-10-CM

## 2019-10-16 DIAGNOSIS — I313 Pericardial effusion (noninflammatory): Secondary | ICD-10-CM

## 2019-10-16 DIAGNOSIS — I3139 Other pericardial effusion (noninflammatory): Secondary | ICD-10-CM

## 2019-11-06 ENCOUNTER — Ambulatory Visit: Payer: Medicaid Other | Admitting: Cardiology

## 2019-11-13 ENCOUNTER — Ambulatory Visit: Payer: Medicaid Other | Admitting: Cardiology

## 2019-11-13 ENCOUNTER — Other Ambulatory Visit: Payer: Self-pay

## 2019-11-13 ENCOUNTER — Encounter: Payer: Self-pay | Admitting: Cardiology

## 2019-11-13 VITALS — BP 187/93 | HR 79 | Temp 98.0°F | Resp 17 | Ht 69.0 in | Wt 249.0 lb

## 2019-11-13 DIAGNOSIS — I517 Cardiomegaly: Secondary | ICD-10-CM

## 2019-11-13 DIAGNOSIS — I10 Essential (primary) hypertension: Secondary | ICD-10-CM

## 2019-11-13 DIAGNOSIS — I4729 Other ventricular tachycardia: Secondary | ICD-10-CM

## 2019-11-13 DIAGNOSIS — I472 Ventricular tachycardia: Secondary | ICD-10-CM | POA: Insufficient documentation

## 2019-11-13 NOTE — Progress Notes (Signed)
Patient referred by [name], MD for hypertensive heart disease  Subjective:   [name], female    DOB: 1971/01/29, 49 y.o.   MRN: [id number]   Chief Complaint  Patient presents with  . Hypertension  . Follow-up    3 month    49 y.o. African-American female with hypertension, uncontrolled type 2 diabetes mellitus, hyperlipidemia, tobacco abuse, recurrent strokes.  Discussed recent event monitor results with the patient, details below. Patient states that she is very nervous today. Blood pressure remains elevated. She is compliant with her medical therapy.    Current Outpatient Medications on File Prior to Visit  Medication Sig Dispense Refill  . Accu-Chek FastClix Lancets MISC 4 (four) times daily.    Marland Kitchen ACCU-CHEK GUIDE test strip See admin instructions. for testing    . albuterol (VENTOLIN HFA) 108 (90 Base) MCG/ACT inhaler Inhale into the lungs every 6 (six) hours as needed for wheezing or shortness of breath.    Marland Kitchen amLODipine-benazepril (LOTREL) 10-40 MG capsule Take 1 capsule by mouth daily.    Marland Kitchen aspirin EC 81 MG EC tablet Take 1 tablet (81 mg total) by mouth daily.    . blood glucose meter kit and supplies KIT Dispense based on patient and insurance preference. Use up to four times daily as directed. (FOR ICD-9 250.00, 250.01). 1 each 0  . carvedilol (COREG) 25 MG tablet Take 1 tablet (25 mg total) by mouth 2 (two) times daily with a meal. 180 tablet 2  . Cholecalciferol (VITAMIN D3) 25 MCG (1000 UT) CAPS Take 1 capsule by mouth daily.    . folic acid (FOLVITE) 1 MG tablet Take 1 mg by mouth daily.    . furosemide (LASIX) 20 MG tablet Take 20 mg by mouth 2 (two) times daily.    Marland Kitchen glipiZIDE (GLUCOTROL) 10 MG tablet Take 10 mg by mouth daily.    . hydrochlorothiazide (HYDRODIURIL) 25 MG tablet Take 1 tablet (25 mg total) by mouth daily. 30 tablet 3  . hydrOXYzine (ATARAX/VISTARIL) 25 MG tablet Take 25 mg by mouth at bedtime.    . insulin aspart protamine-  aspart (NOVOLOG MIX 70/30) (70-30) 100 UNIT/ML injection Inject 0.15 mLs (15 Units total) into the skin 2 (two) times daily with a meal. (Patient taking differently: Inject into the skin. 15 units in the am,17 in the evening) 10 mL 11  . Insulin Syringes, Disposable, Y-503 1 ML MISC 1 application by Does not apply route 2 (two) times a day. 100 each 0  . isosorbide-hydrALAZINE (BIDIL) 20-37.5 MG tablet Take 1 tablet by mouth 3 (three) times daily. 180 tablet 3  . metFORMIN (GLUCOPHAGE) 1000 MG tablet Take 1,000 mg by mouth 2 (two) times daily.    . rosuvastatin (CRESTOR) 10 MG tablet Take 1 tablet (10 mg total) by mouth daily. 30 tablet 3  . vitamin B-12 (CYANOCOBALAMIN) 1000 MCG tablet Take 1,000 mcg by mouth daily.     No current facility-administered medications on file prior to visit.    Cardiovascular studies:  Event monitor 09/17/2019 - 10/16/2019: Diagnostic time: 94%  Dominant rhythm: Sinus. HR 66-112 bpm. Avg HR 80 bpm. 6 episodes of NSVT up to 9 beats seen.  No atrial fibrillation/atrial flutter/SVT/high grade AV block, sinus pause >3sec noted. Symptoms reported: None  Echocardiogram 10/17/2019:  Normal LV systolic function with visual EF 50-55%. Left ventricle cavity  is normal in size. Normal global wall motion. Doppler evidence of grade II  (pseudonormal) diastolic dysfunction, elevated LAP. Severe  left  ventricular hypertrophy. No obvious regional wall motion abnormalities.  Calculated EF 57%.  Left atrial cavity is moderate to severely dilated.  Mild (Grade I) aortic regurgitation.  Mild to moderate mitral regurgitation.  Mild tricuspid regurgitation. RVSP measures 33 mmHg.  Mild pulmonic regurgitation.  Small pericardial effusion with no hemodynamic compromise.  Prior study dated 07/20/2018: LVEF 45-50%, Grade II diastolic dysfunction,  severely dilated LA, Mild AR, Mild MR, insignificant pericardial effusion.    EKG 09/17/2019: Sinus rhythm 77 bpm. Left atrial  enlargement.  Old anteroseptal infarct.  Nonspecific T-abnormality.   MRI brain 08/20/2019: - Right frontal parasagittal and anterior corpus callosum lesion which is hyperintense on DWI, slightly hypointense on ADC map, hyperintense on T2 and FLAIR views, with heterogeneous enhancement.  Considerations could include subacute infarction, autoimmune, inflammatory or neoplastic etiologies. This is a new finding compared to 02/11/2019. - Mild scattered periventricular, subcortical, pontine chronic small vessel ischemic disease.   CTA head/neck 02/12/2019: 1. Diffuse distal branch vessel irregularity throughout the circle-of-Willis without a significant proximal stenosis or occlusion. 2. Atherosclerotic changes are evident within the cavernous internal carotid arteries and right greater than left M1 segments with diffuse vessel irregularity and mild narrowing of less than 50% relative to the more distal vessels. 3. Tortuosity of the cervical internal carotid arteries and vertebral arteries bilaterally without significant stenosis. This is nonspecific, but often seen in the setting of chronic hypertension. 4. Brainstem infarct and white matter parenchymal changes noted on MRI are not discernible by CT.  Echocardiogram 02/12/2019:  1. The left ventricle has normal systolic function with an ejection fraction of 60-65%. The cavity size was normal. There is severe concentric left ventricular hypertrophy. Left ventricular diastolic Doppler parameters are consistent with impaired  relaxation.  2. The right ventricle has normal systolic function. The cavity was normal. There is no increase in right ventricular wall thickness.  3. Small pericardial effusion.  4. The mitral valve is abnormal. Mild thickening of the mitral valve leaflet. No evidence of mitral valve stenosis.  5. The aortic valve is grossly normal. Mild thickening of the aortic valve. Mild calcification of the aortic valve. Aortic valve  regurgitation is mild by color flow Doppler. No stenosis of the aortic valve.  6. There is a small area of calcification on the [place].  7. The aortic root and ascending aorta are normal in size and structure.  8. The interatrial septum was not assessed.  MRI brain 02/11/2019: 1. Patchy small volume acute ischemic nonhemorrhagic infarct involving the left lateral midbrain/pons. 2. Underlying mild chronic microvascular ischemic disease.   Recent labs: 03/12/2019: Glucose 170.  BUN/creatinine 26/1.3.  EGFR 57.  NA/K1 39/4.2. H/H 12.6/37.5.  MCV 91.  Platelets 245.  Normal iron studies. Hemoglobin A1c 8.5%. Cholesterol 106, triglycerides 103, HDL 38, LDL 47   Review of Systems  Cardiovascular: Positive for dyspnea on exertion. Negative for chest pain, leg swelling, palpitations and syncope.  Genitourinary: Negative for dysuria.  Neurological: Positive for numbness.       Foot drop  All other systems reviewed and are negative.        Vitals:   11/13/19 1358 11/13/19 1359  BP: (!) 182/96 (!) 187/93  Pulse: 79 79  Resp: 17   Temp: 98 F (36.7 C)   SpO2: 99%      Objective:   Physical Exam  Constitutional: No distress.  HENT:  Head: Normocephalic and atraumatic.  Neck: No JVD present.  Cardiovascular: Normal rate, regular rhythm and intact distal  pulses.  Murmur heard.  Early systolic murmur is present with a grade of 2/6 at the lower right sternal border. Pulmonary/Chest: Effort normal and breath sounds normal. She has no rales.  Musculoskeletal:        General: No edema.  Neurological: No cranial nerve deficit.  Psychiatric: She has a normal mood and affect.  Nursing note and vitals reviewed.         Assessment & Recommendations:   49 y.o. 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  Hypertension: Uncontrolled.  Strong  family history of hypertension. Continue carvedilol 25 mg bid, amlodipine-benazepril 10-40 mg daily,Bidil from 1 tab bid to 1 tab tid, HCTZ 25 mg daily.  Referred for remote patient monitoring. If SBP remains >160 mmHg, will add clonidine 0.1 mg bid.  She certainly has resistant hypertension and may need workup. She also seen nephrology. I am not sure if she has had any workup with them, including renin/aldostrone and renal artery duplex. I will await records from them prior to performing that workup again.   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 Less likely to be amyloidosis.  History of stroke: No Afib on even r monitor.  Continue Aspirin, statin.  NSVT: No chest pain. Low suspicion for ischemia.  Continue beta blocker. Consider evaluation for OSA.  Type 2 diabetes mellitus: Uncontrolled.  Management as per PCP.  F/u in 3 months  Ramos, MD [facility]. [place] Pager: [phone] Office: [phone] If no answer Cell [phone]

## 2019-11-21 ENCOUNTER — Other Ambulatory Visit: Payer: Self-pay | Admitting: Pharmacist

## 2019-11-21 DIAGNOSIS — I10 Essential (primary) hypertension: Secondary | ICD-10-CM

## 2019-11-21 MED ORDER — AMLODIPINE BESY-BENAZEPRIL HCL 10-40 MG PO CAPS: 1.0000 | ORAL_CAPSULE | Freq: Every day | ORAL | 2 refills | Status: DC

## 2019-11-21 MED ORDER — BIDIL 20-37.5 MG PO TABS: 2.0000 | ORAL_TABLET | Freq: Three times a day (TID) | ORAL | 2 refills | Status: AC

## 2019-11-25 NOTE — Progress Notes (Signed)
[facility] Note  11/26/2019     CHIEF COMPLAINT Patient presents for Retina Follow Up   HISTORY OF PRESENT ILLNESS: [name] is a 49 y.o. female who presents to the clinic today for:   HPI    Retina Follow Up    Patient presents with  Diabetic Retinopathy.  In both eyes.  This started months ago.  Severity is moderate.  Duration of 3 months.  Since onset it is gradually improving.  I, the attending physician,  performed the HPI with the patient and updated documentation appropriately.          Comments    49 y/o female pt here for 3 mo f/u for severe NPDR w/mac edema OU.  Has not noticed any change in [place] OU.  Denies pain, FOL, floaters.  No gtts.  BS 156 this a.m.  Last A1C in 07/2019 was 7.0.  BP this a.m. was 143/76.  Pt says she feels "weird" this morning, and nearly cancelled her appt b/c of it.  Feels lightheaded and spacy.  States she missed her period for several months, but it came back suddenly a couple of days ago.       Last edited by Bernarda Caffey, MD on 11/26/2019 11:32 PM. (History)    pt states she is not feeling well today, she states she has been monitoring her BP with a nurse, she states this morning it was 143/76, but normally the bottom number is around 88  Referring physician: Gevena Cotton, MD [address] [place],  [place] [id number]  HISTORICAL INFORMATION:   Selected notes from the [facility] Referred by Dr. Gevena Cotton for concern of PDR OU   CURRENT MEDICATIONS: No current outpatient medications on file. (Ophthalmic Drugs)   No current facility-administered medications for this visit. (Ophthalmic Drugs)   Current Outpatient Medications (Other)  Medication Sig  . Accu-Chek FastClix Lancets MISC 4 (four) times daily.  Marland Kitchen ACCU-CHEK GUIDE test strip See admin instructions. for testing  . albuterol (VENTOLIN HFA) 108 (90 Base) MCG/ACT inhaler Inhale into the lungs every 6 (six) hours  as needed for wheezing or shortness of breath.  Marland Kitchen amLODipine-benazepril (LOTREL) 10-40 MG capsule Take 1 capsule by mouth daily.  Marland Kitchen aspirin EC 81 MG EC tablet Take 1 tablet (81 mg total) by mouth daily.  . blood glucose meter kit and supplies KIT Dispense based on patient and insurance preference. Use up to four times daily as directed. (FOR ICD-9 250.00, 250.01).  . carvedilol (COREG) 25 MG tablet Take 1 tablet (25 mg total) by mouth 2 (two) times daily with a meal.  . Cholecalciferol (VITAMIN D3) 25 MCG (1000 UT) CAPS Take 1 capsule by mouth daily.  . folic acid (FOLVITE) 1 MG tablet Take 1 mg by mouth daily.  . furosemide (LASIX) 20 MG tablet Take 20 mg by mouth 2 (two) times daily.  Marland Kitchen glipiZIDE (GLUCOTROL) 10 MG tablet Take 10 mg by mouth daily.  . hydrochlorothiazide (HYDRODIURIL) 25 MG tablet Take 1 tablet (25 mg total) by mouth daily.  . hydrOXYzine (ATARAX/VISTARIL) 25 MG tablet Take 25 mg by mouth at bedtime.  . insulin aspart protamine- aspart (NOVOLOG MIX 70/30) (70-30) 100 UNIT/ML injection Inject 0.15 mLs (15 Units total) into the skin 2 (two) times daily with a meal. (Patient taking differently: Inject into the skin. 15 units in the am,17 in the evening)  . Insulin Syringes, Disposable, U-100 1 ML MISC  1 application by Does not apply route 2 (two) times a day.  . isosorbide-hydrALAZINE (BIDIL) 20-37.5 MG tablet Take 2 tablets by mouth 3 (three) times daily.  . metFORMIN (GLUCOPHAGE) 1000 MG tablet Take 1,000 mg by mouth 2 (two) times daily.  . rosuvastatin (CRESTOR) 10 MG tablet Take 1 tablet (10 mg total) by mouth daily.  . vitamin B-12 (CYANOCOBALAMIN) 1000 MCG tablet Take 1,000 mcg by mouth daily.   No current facility-administered medications for this visit. (Other)      REVIEW OF SYSTEMS: ROS    Positive for: Neurological, Genitourinary, Endocrine, Eyes, Respiratory   Negative for: Constitutional, Gastrointestinal, Skin, Musculoskeletal, HENT, Cardiovascular, Psychiatric,  Allergic/Imm, Heme/Lymph   Last edited by Matthew Folks, COA on 11/26/2019  9:32 AM. (History)       ALLERGIES No Known Allergies  PAST MEDICAL HISTORY Past Medical History:  Diagnosis Date  . Arthritis   . Asthma   . Cataract    Mixed form OU  . CKD (chronic kidney disease)   . Coronary artery disease   . Diabetes mellitus   . Diabetic retinopathy ([place])    NPDR OU  . Hyperlipidemia   . Hypertension   . Hypertensive retinopathy    OU  . Left thyroid nodule    diagnosed 07/2018  . Pseudotumor cerebri   . Stroke ([place])   . Vitamin D deficiency    Past Surgical History:  Procedure Laterality Date  . ACHILLES TENDON REPAIR      FAMILY HISTORY Family History  Problem Relation Age of Onset  . Asthma Mother   . Hypertension Father   . Kidney disease Father   . Asthma Other   . Hyperlipidemia Other   . Hypertension Other   . Cancer Other   . Diabetes Maternal Grandmother   . Congestive Heart Failure Maternal Grandmother   . Hypertension Sister   . Asthma Sister   . Congestive Heart Failure Maternal Aunt     SOCIAL HISTORY Social History   Tobacco Use  . Smoking status: Former Smoker    Packs/day: 0.50    Years: 30.00    Pack years: 15.00    Types: Cigarettes    Quit date: 2017    Years since quitting: 4.3  . Smokeless tobacco: Former Systems developer    Quit date: 03/31/2016  Substance Use Topics  . Alcohol use: Yes    Alcohol/week: 0.0 standard drinks    Comment: Occassional   . Drug use: Yes    Types: Marijuana    Comment: Occassional          OPHTHALMIC EXAM:  Base Eye Exam    Visual Acuity (Snellen - Linear)      Right Left   Dist cc 20/30 20/40   Dist ph cc NI NI   Correction: Glasses       Tonometry (Tonopen, 9:35 AM)      Right Left   Pressure 16 17       Pupils      Dark Light Shape React APD   Right 3 2 Round Minimal None   Left 3 2 Round Minimal None       Visual Fields (Counting fingers)      Left Right    Full Full        Extraocular Movement      Right Left    Full, Ortho Full, Ortho       Neuro/Psych    Oriented x3: Yes   Mood/Affect: Normal  Dilation    Both eyes: 1.0% Mydriacyl, 2.5% Phenylephrine @ 9:35 AM        Slit Lamp and Fundus Exam    Slit Lamp Exam      Right Left   Lids/Lashes Mild Meibomian gland dysfunction Mild Meibomian gland dysfunction   Conjunctiva/Sclera Melanosis Melanosis   Cornea Focal NV and pigment at 0530 limbus Trace Punctate epithelial erosions   Anterior Chamber Deep and clear, narrow temporal angle Deep and clear, narrow temporal angle   Iris Round and moderately dilated to 32m, No NVI Round and moderately dilated to 633m No NVI   Lens 1-2+ Nuclear sclerosis, 1-2+ Cortical cataract 1-2+ Nuclear sclerosis, 1-2+ Cortical cataract   Vitreous Mild Vitreous syneresis Mild Vitreous syneresis       Fundus Exam      Right Left   Disc mild Pallor, Sharp rim Pink and Sharp, mild tilt, mild temporal PPA   C/D Ratio 0.3 0.3   Macula Good foveal reflex, scattered MA and CWS, flame heme superior macula good foveal reflex, scattered cystic changes / MA, focal CWS greatest inferiorly   Vessels Vascular attenuation, Tortuous, mild AV crossing changes Vascular attenuation, Tortuous, mild AV crossing changes   Periphery Attached, scattered MA/IRH / DBH / CWS -- mostly posterior, early flat NV SN to disc, focal pigmented CR scar at 1030 Attached, scattered IRH and CWS greatest posteriorly          IMAGING AND PROCEDURES  Imaging and Procedures for _0 @  OCT, Retina - OU - Both Eyes       Right Eye Quality was good. Central Foveal Thickness: 224. Progression has been stable. Findings include normal foveal contour, no SRF, intraretinal hyper-reflective material, intraretinal fluid (Scattered, non-central cystic changes -- stable from prior).   Left Eye Quality was good. Central Foveal Thickness: 234. Progression has improved. Findings include abnormal foveal contour,  intraretinal fluid, intraretinal hyper-reflective material, no SRF (Mild interval improvement in non-central cystic changes; inner retinal atrophy nasal macula).   Notes *Images captured and stored on drive  Diagnosis / Impression:  OD: NFP, no SRF; Scattered, non-central cystic changes -- stable from prior OS: Mild IRF / DME and retinal atrophy inf nasal macula -- Mild interval improvement in non-central cystic changes; inner retinal atrophy nasal macula  Clinical management:  See below  Abbreviations: NFP - Normal foveal profile. CME - cystoid macular edema. PED - pigment epithelial detachment. IRF - intraretinal fluid. SRF - subretinal fluid. EZ - ellipsoid zone. ERM - epiretinal membrane. ORA - outer retinal atrophy. ORT - outer retinal tubulation. SRHM - subretinal hyper-reflective material        Fluorescein Angiography Optos (Transit OS)       Right Eye   Progression has no prior data. Early phase findings include microaneurysm, vascular perfusion defect. Mid/Late phase findings include microaneurysm, leakage, vascular perfusion defect.   Left Eye   Progression has no prior data. Early phase findings include microaneurysm, delayed filling, vascular perfusion defect. Mid/Late phase findings include microaneurysm, vascular perfusion defect, leakage.   Notes **Images stored on drive**  Impression: Moderate NPDR OU Late leaking MA OU Focal vascular perfusion defects OU No NV OU Delayed filling time OS                  ASSESSMENT/PLAN:    ICD-10-CM   1. Severe nonproliferative diabetic retinopathy of both eyes with macular edema associated with type 2 diabetes mellitus ([place] [id number] 2. Retinal edema  H35.81 OCT,  Retina - OU - Both Eyes  3. Essential hypertension  I10   4. Combined forms of age-related cataract of both eyes  H25.813   5. History of stroke  Z86.73   6. Hypertensive retinopathy of both eyes  H35.033 Fluorescein Angiography Optos (Transit  OS)    1,2. 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  - exam shows scattered IRH and CWS OU  - OCT with mild DME OS (slightly improved), OD with persistent non-central cystic changes OD  - FA (04.20.21) shows delayed filling time OS; no frank NV, but late leaking MA and scattered patches of capillary nonperfusion OU  - discussed findings and prognosis  - cleared from retina standpoint for regular appt with Dr. Frederico Hamman -- pt wishes to update spectacles  - f/u 3-4 months  3-5. Hypertensive retinopathy OU with history of recent stroke  - stroke July 2020 -- ischemic non-hemorrhagic infarct involving L lateral midbrain/pons  - BP in office on 11.23.2020 was  209/112 (left arm)  - 01.20.21, bp 160/87  - BP this morning (taken at home) 143/76 per pt report  - repeat MRI brain done 01.12.21, which showed new subacute infarction R frontal parasagittal and anterior coprus callosum  - pt reports recent changes in BP meds  - discussed importance of tight BP control  - monitor  6. 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  - discussed diagnosis and progression  - not yet visually significant  - monitor for now   Ophthalmic Meds Ordered this visit:  No orders of the defined types were placed in this encounter.      Return for f/u 3-4 months NPDR OU, DFE, OCT.  There are no Patient Instructions on file for this visit.   Explained the diagnoses, plan, and follow up with the patient and they expressed understanding.  Patient expressed understanding of the importance of proper follow up care.   This document serves as a record of services personally performed by Gardiner Sleeper, MD, PhD. It was created on their behalf by Estill Bakes, COT an ophthalmic technician. The creation of this record is the provider's dictation and/or activities during the visit.    Electronically signed by:  Estill Bakes, COT 11/25/19 @ 11:39 PM   This document serves as a record of services personally performed by Gardiner Sleeper, MD, PhD. It was created on their behalf by Ernest Mallick, OA, an ophthalmic assistant. The creation of this record is the provider's dictation and/or activities during the visit.    Electronically signed by: Ernest Mallick, OA 04.20.2021 11:39 PM  Gardiner Sleeper, M.D., Ph.D. Diseases & Surgery of the Retina and [place] 11/26/2019   I have reviewed the above documentation for accuracy and completeness, and I agree with the above. Gardiner Sleeper, M.D., Ph.D. 11/26/19 11:39 PM   Abbreviations: M myopia (nearsighted); A astigmatism; H hyperopia (farsighted); P presbyopia; Mrx spectacle prescription;  CTL contact lenses; OD right eye; OS left eye; OU both eyes  XT exotropia; ET esotropia; PEK punctate epithelial keratitis; PEE punctate epithelial erosions; DES dry eye syndrome; MGD meibomian gland dysfunction; ATs artificial tears; PFAT's preservative free artificial tears; [place] nuclear sclerotic cataract; PSC posterior subcapsular cataract; ERM epi-retinal membrane; PVD posterior vitreous detachment; RD retinal detachment; DM diabetes mellitus; DR diabetic retinopathy; NPDR non-proliferative diabetic retinopathy; PDR proliferative diabetic retinopathy; CSME clinically significant macular edema; DME  diabetic macular edema; dbh dot blot hemorrhages; CWS cotton wool spot; POAG primary open angle glaucoma; C/D cup-to-disc ratio; HVF humphrey visual field; GVF goldmann visual field; OCT optical coherence tomography; IOP intraocular pressure; BRVO Branch retinal vein occlusion; CRVO central retinal vein occlusion; CRAO central retinal artery occlusion; BRAO branch retinal artery occlusion; RT retinal tear; SB scleral buckle; PPV pars plana vitrectomy; VH Vitreous hemorrhage; PRP panretinal laser photocoagulation; IVK intravitreal kenalog; VMT  vitreomacular traction; MH Macular hole;  NVD neovascularization of the disc; NVE neovascularization elsewhere; AREDS age related eye disease study; ARMD age related macular degeneration; POAG primary open angle glaucoma; EBMD epithelial/anterior basement membrane dystrophy; ACIOL anterior chamber intraocular lens; IOL intraocular lens; PCIOL posterior chamber intraocular lens; Phaco/IOL phacoemulsification with intraocular lens placement; [place] photorefractive keratectomy; LASIK laser assisted in situ keratomileusis; HTN hypertension; DM diabetes mellitus; COPD chronic obstructive pulmonary disease

## 2019-11-26 ENCOUNTER — Other Ambulatory Visit: Payer: Self-pay

## 2019-11-26 ENCOUNTER — Encounter (INDEPENDENT_AMBULATORY_CARE_PROVIDER_SITE_OTHER): Payer: Self-pay | Admitting: Ophthalmology

## 2019-11-26 ENCOUNTER — Ambulatory Visit (INDEPENDENT_AMBULATORY_CARE_PROVIDER_SITE_OTHER): Payer: Medicaid Other | Admitting: Ophthalmology

## 2019-11-26 DIAGNOSIS — H3581 Retinal edema: Secondary | ICD-10-CM | POA: Diagnosis not present

## 2019-11-26 DIAGNOSIS — I10 Essential (primary) hypertension: Secondary | ICD-10-CM | POA: Diagnosis not present

## 2019-11-26 DIAGNOSIS — H25813 Combined forms of age-related cataract, bilateral: Secondary | ICD-10-CM | POA: Diagnosis not present

## 2019-11-26 DIAGNOSIS — E113413 Type 2 diabetes mellitus with severe nonproliferative diabetic retinopathy with macular edema, bilateral: Secondary | ICD-10-CM

## 2019-11-26 DIAGNOSIS — H35033 Hypertensive retinopathy, bilateral: Secondary | ICD-10-CM

## 2019-11-26 DIAGNOSIS — Z8673 Personal history of transient ischemic attack (TIA), and cerebral infarction without residual deficits: Secondary | ICD-10-CM

## 2019-11-28 ENCOUNTER — Encounter: Payer: Self-pay | Admitting: Obstetrics

## 2019-11-28 ENCOUNTER — Ambulatory Visit: Payer: Medicaid Other | Admitting: Obstetrics

## 2019-11-28 ENCOUNTER — Other Ambulatory Visit: Payer: Self-pay

## 2019-11-28 VITALS — BP 169/86 | HR 83 | Ht 69.0 in | Wt 249.0 lb

## 2019-11-28 DIAGNOSIS — Z8673 Personal history of transient ischemic attack (TIA), and cerebral infarction without residual deficits: Secondary | ICD-10-CM | POA: Diagnosis not present

## 2019-11-28 DIAGNOSIS — E139 Other specified diabetes mellitus without complications: Secondary | ICD-10-CM

## 2019-11-28 DIAGNOSIS — N939 Abnormal uterine and vaginal bleeding, unspecified: Secondary | ICD-10-CM | POA: Diagnosis not present

## 2019-11-28 DIAGNOSIS — F17200 Nicotine dependence, unspecified, uncomplicated: Secondary | ICD-10-CM

## 2019-11-28 DIAGNOSIS — E669 Obesity, unspecified: Secondary | ICD-10-CM

## 2019-11-28 DIAGNOSIS — Z6830 Body mass index (BMI) 30.0-30.9, adult: Secondary | ICD-10-CM

## 2019-11-28 NOTE — Progress Notes (Signed)
Patient ID: [name], female   DOB: Oct 15, 1970, 49 y.o.   MRN: [id number]  Chief Complaint  Patient presents with  . GYN    HPI [name] is a 49 y.o. female.  Complains of irregular periods since having a stroke last July.  Contraception:  BTL. HPI  Past Medical History:  Diagnosis Date  . Arthritis   . Asthma   . Cataract    Mixed form OU  . CKD (chronic kidney disease)   . Coronary artery disease   . Diabetes mellitus   . Diabetic retinopathy ([place])    NPDR OU  . Hyperlipidemia   . Hypertension   . Hypertensive retinopathy    OU  . Left thyroid nodule    diagnosed 07/2018  . Pseudotumor cerebri   . Stroke ([place])   . Vitamin D deficiency     Past Surgical History:  Procedure Laterality Date  . ACHILLES TENDON REPAIR    . TUBAL LIGATION      Family History  Problem Relation Age of Onset  . Asthma Mother   . Hypertension Father   . Kidney disease Father   . Asthma Other   . Hyperlipidemia Other   . Hypertension Other   . Cancer Other   . Diabetes Maternal Grandmother   . Congestive Heart Failure Maternal Grandmother   . Hypertension Sister   . Asthma Sister   . Congestive Heart Failure Maternal Aunt     Social History Social History   Tobacco Use  . Smoking status: Former Smoker    Packs/day: 0.50    Years: 30.00    Pack years: 15.00    Types: Cigarettes    Quit date: 2017    Years since quitting: 4.3  . Smokeless tobacco: Former Systems developer    Quit date: 03/31/2016  Substance Use Topics  . Alcohol use: Yes    Alcohol/week: 0.0 standard drinks    Comment: Occassional   . Drug use: Yes    Types: Marijuana    Comment: Occassional     No Known Allergies  Current Outpatient Medications  Medication Sig Dispense Refill  . ACCU-CHEK GUIDE test strip See admin instructions. for testing    . albuterol (VENTOLIN HFA) 108 (90 Base) MCG/ACT inhaler Inhale into the lungs every 6 (six) hours as needed for wheezing or shortness of breath.    Marland Kitchen  amLODipine-benazepril (LOTREL) 10-40 MG capsule Take 1 capsule by mouth daily. 90 capsule 2  . aspirin EC 81 MG EC tablet Take 1 tablet (81 mg total) by mouth daily.    . blood glucose meter kit and supplies KIT Dispense based on patient and insurance preference. Use up to four times daily as directed. (FOR ICD-9 250.00, 250.01). 1 each 0  . carvedilol (COREG) 25 MG tablet Take 1 tablet (25 mg total) by mouth 2 (two) times daily with a meal. 180 tablet 2  . Cholecalciferol (VITAMIN D3) 25 MCG (1000 UT) CAPS Take 1 capsule by mouth daily.    . folic acid (FOLVITE) 1 MG tablet Take 1 mg by mouth daily.    . furosemide (LASIX) 20 MG tablet Take 20 mg by mouth 2 (two) times daily.    Marland Kitchen glipiZIDE (GLUCOTROL) 10 MG tablet Take 10 mg by mouth daily.    . hydrochlorothiazide (HYDRODIURIL) 25 MG tablet Take 1 tablet (25 mg total) by mouth daily. 30 tablet 3  . hydrOXYzine (ATARAX/VISTARIL) 25 MG tablet Take 25 mg by mouth at bedtime.    Marland Kitchen  Insulin Syringes, Disposable, G-254 1 ML MISC 1 application by Does not apply route 2 (two) times a day. 100 each 0  . isosorbide-hydrALAZINE (BIDIL) 20-37.5 MG tablet Take 2 tablets by mouth 3 (three) times daily. 180 tablet 2  . metFORMIN (GLUCOPHAGE) 1000 MG tablet Take 1,000 mg by mouth 2 (two) times daily.    . rosuvastatin (CRESTOR) 10 MG tablet Take 1 tablet (10 mg total) by mouth daily. 30 tablet 3  . vitamin B-12 (CYANOCOBALAMIN) 1000 MCG tablet Take 1,000 mcg by mouth daily.    . Accu-Chek FastClix Lancets MISC 4 (four) times daily.    . insulin aspart protamine- aspart (NOVOLOG MIX 70/30) (70-30) 100 UNIT/ML injection Inject 0.15 mLs (15 Units total) into the skin 2 (two) times daily with a meal. (Patient taking differently: Inject into the skin. 15 units in the am,17 in the evening) 10 mL 11   No current facility-administered medications for this visit.    Review of Systems Review of Systems Constitutional: negative for fatigue and weight  loss Respiratory: negative for cough and wheezing Cardiovascular: negative for chest pain, fatigue and palpitations Gastrointestinal: negative for abdominal pain and change in bowel habits Genitourinary:positive for irregular periods Integument/breast: negative for nipple discharge Musculoskeletal:negative for myalgias Neurological: negative for gait problems and tremors Behavioral/Psych: negative for abusive relationship, depression Endocrine: negative for temperature intolerance      Blood pressure (!) 169/86, pulse 83, height 5' 9"  (1.753 m), weight 249 lb (112.9 kg), last menstrual period 11/23/2019.  Physical Exam Physical Exam General:   alert and no distress  Skin:   no rash or abnormalities  Lungs:   clear to auscultation bilaterally  Heart:   regular rate and rhythm, S1, S2 normal, no murmur, click, rub or gallop                  Pelvic:  Deferred due to menstruation  >50% of 15 min visit spent on counseling and coordination of care.   Data Reviewed Labs  Assessment     1. Abnormal uterine bleeding (AUB) Rx: - US PELVIC COMPLETE WITH TRANSVAGINAL; Future - CBC - Comprehensive metabolic panel  2. Diabetes 1.5, managed as type 2 ([place]) - managed by PCP - clinically stable glucose control  3. H/O: stroke - clinically stable  4. Obesity (BMI 30-39.9) - program of caloric reduction, exercise and behavioral modification recommended  5. Tobacco dependence - cessation with the aid of medication and behavioral modification recommended    Plan  Follow up in 4 weeks   Orders Placed This Encounter  Procedures  . US PELVIC COMPLETE WITH TRANSVAGINAL    Standing Status:   Future    Standing Expiration Date:   01/27/2021    Order Specific Question:   Reason for Exam (SYMPTOM  OR DIAGNOSIS REQUIRED)    Answer:   AUB    Order Specific Question:   Preferred imaging location?    Answer:   [facility] Outpatient Ultrasound  . CBC  . Comprehensive metabolic panel      Shelly Bombard, MD 11/28/2019 3:45 PM

## 2019-11-28 NOTE — Progress Notes (Signed)
Pt is in the office complaining of AUB Had stroke in July 2020, has had irregular cycle since then.  After 92 days, cycle came back, LMP 11-23-19 and pt had dizziness, lightheadedness and heavier flow.

## 2019-11-29 LAB — CBC
Hematocrit: 32 % — ABNORMAL LOW (ref 34.0–46.6)
Hemoglobin: 11.4 g/dL (ref 11.1–15.9)
MCH: 30.5 pg (ref 26.6–33.0)
MCHC: 35.6 g/dL (ref 31.5–35.7)
MCV: 86 fL (ref 79–97)
Platelets: 254 10*3/uL (ref 150–450)
RBC: 3.74 x10E6/uL — ABNORMAL LOW (ref 3.77–5.28)
RDW: 14.5 % (ref 11.7–15.4)
WBC: 9.7 10*3/uL (ref 3.4–10.8)

## 2019-11-29 LAB — COMPREHENSIVE METABOLIC PANEL
ALT: 9 IU/L (ref 0–32)
AST: 6 IU/L (ref 0–40)
Albumin/Globulin Ratio: 1.2 (ref 1.2–2.2)
Albumin: 3.9 g/dL (ref 3.8–4.8)
Alkaline Phosphatase: 63 IU/L (ref 39–117)
BUN/Creatinine Ratio: 14 (ref 9–23)
BUN: 33 mg/dL — ABNORMAL HIGH (ref 6–24)
Bilirubin Total: <0.2 mg/dL (ref 0.0–1.2)
CO2: 23 mmol/L (ref 20–29)
Calcium: 9 mg/dL (ref 8.7–10.2)
Chloride: 99 mmol/L (ref 96–106)
Creatinine, Ser: 2.33 mg/dL — ABNORMAL HIGH (ref 0.57–1.00)
GFR calc Af Amer: 28 mL/min/{1.73_m2} — ABNORMAL LOW (ref >59)
GFR calc non Af Amer: 24 mL/min/{1.73_m2} — ABNORMAL LOW (ref >59)
Globulin, Total: 3.2 g/dL (ref 1.5–4.5)
Glucose: 177 mg/dL — ABNORMAL HIGH (ref 65–99)
Potassium: 4 mmol/L (ref 3.5–5.2)
Sodium: 138 mmol/L (ref 134–144)
Total Protein: 7.1 g/dL (ref 6.0–8.5)

## 2019-12-10 ENCOUNTER — Ambulatory Visit: Admission: RE | Admit: 2019-12-10 | Payer: Medicaid Other | Source: Ambulatory Visit

## 2019-12-10 ENCOUNTER — Ambulatory Visit: Payer: Medicaid Other | Admitting: Obstetrics

## 2019-12-12 ENCOUNTER — Other Ambulatory Visit: Payer: Self-pay

## 2019-12-12 ENCOUNTER — Encounter: Payer: Self-pay | Admitting: Obstetrics & Gynecology

## 2019-12-12 ENCOUNTER — Ambulatory Visit: Payer: Medicaid Other | Admitting: Obstetrics & Gynecology

## 2019-12-12 ENCOUNTER — Other Ambulatory Visit (HOSPITAL_COMMUNITY)
Admission: RE | Admit: 2019-12-12 | Discharge: 2019-12-12 | Disposition: A | Discharge status: Home / Self Care | Payer: Medicaid Other | Source: Ambulatory Visit | Attending: Obstetrics & Gynecology | Admitting: Obstetrics & Gynecology

## 2019-12-12 VITALS — BP 163/74 | HR 83 | Ht 69.0 in | Wt 250.6 lb

## 2019-12-12 DIAGNOSIS — E669 Obesity, unspecified: Secondary | ICD-10-CM

## 2019-12-12 DIAGNOSIS — Z01419 Encounter for gynecological examination (general) (routine) without abnormal findings: Secondary | ICD-10-CM | POA: Diagnosis present

## 2019-12-12 DIAGNOSIS — E1159 Type 2 diabetes mellitus with other circulatory complications: Secondary | ICD-10-CM

## 2019-12-12 DIAGNOSIS — Z0000 Encounter for general adult medical examination without abnormal findings: Secondary | ICD-10-CM | POA: Diagnosis not present

## 2019-12-12 DIAGNOSIS — Z8673 Personal history of transient ischemic attack (TIA), and cerebral infarction without residual deficits: Secondary | ICD-10-CM

## 2019-12-12 NOTE — Progress Notes (Signed)
Patient ID: [name], female   DOB: 03-25-1971, 49 y.o.   MRN: [id number]  Chief Complaint  Patient presents with  . Gynecologic Exam    HPI [name] is a 49 y.o. female.  [id number] [name] last menstrual period was 11/23/2019.  Her menses are irregular with some heavy flow, every 2-3 months now. She notes VMS, and is followed closely by her PCP and nephrologist for HTN, DM, kidney disease. She needs a mammogram. Last pap was normal 2017.     HPI  Past Medical History:  Diagnosis Date  . Arthritis   . Asthma   . Cataract    Mixed form OU  . CKD (chronic kidney disease)   . Coronary artery disease   . Diabetes mellitus   . Diabetic retinopathy ([place])    NPDR OU  . Hyperlipidemia   . Hypertension   . Hypertensive retinopathy    OU  . Left thyroid nodule    diagnosed 07/2018  . Pseudotumor cerebri   . Stroke ([place])   . Vitamin D deficiency     Past Surgical History:  Procedure Laterality Date  . ACHILLES TENDON REPAIR    . TUBAL LIGATION      Family History  Problem Relation Age of Onset  . Asthma Mother   . Hypertension Father   . Kidney disease Father   . Asthma Other   . Hyperlipidemia Other   . Hypertension Other   . Cancer Other   . Diabetes Maternal Grandmother   . Congestive Heart Failure Maternal Grandmother   . Hypertension Sister   . Asthma Sister   . Congestive Heart Failure Maternal Aunt     Social History Social History   Tobacco Use  . Smoking status: Former Smoker    Packs/day: 0.50    Years: 30.00    Pack years: 15.00    Types: Cigarettes    Quit date: 2017    Years since quitting: 4.3  . Smokeless tobacco: Former Systems developer    Quit date: 03/31/2016  Substance Use Topics  . Alcohol use: Yes    Alcohol/week: 0.0 standard drinks    Comment: Occassional   . Drug use: Yes    Types: Marijuana    Comment: Occassional     No Known Allergies  Current Outpatient Medications  Medication Sig Dispense Refill  . Accu-Chek  FastClix Lancets MISC 4 (four) times daily.    Marland Kitchen ACCU-CHEK GUIDE test strip See admin instructions. for testing    . albuterol (VENTOLIN HFA) 108 (90 Base) MCG/ACT inhaler Inhale into the lungs every 6 (six) hours as needed for wheezing or shortness of breath.    Marland Kitchen amLODipine-benazepril (LOTREL) 10-40 MG capsule Take 1 capsule by mouth daily. 90 capsule 2  . aspirin EC 81 MG EC tablet Take 1 tablet (81 mg total) by mouth daily.    . blood glucose meter kit and supplies KIT Dispense based on patient and insurance preference. Use up to four times daily as directed. (FOR ICD-9 250.00, 250.01). 1 each 0  . carvedilol (COREG) 25 MG tablet Take 1 tablet (25 mg total) by mouth 2 (two) times daily with a meal. 180 tablet 2  . Cholecalciferol (VITAMIN D3) 25 MCG (1000 UT) CAPS Take 1 capsule by mouth daily.    . folic acid (FOLVITE) 1 MG tablet Take 1 mg by mouth daily.    . furosemide (LASIX) 20 MG tablet Take 20 mg by mouth 2 (two) times daily.    Marland Kitchen  glipiZIDE (GLUCOTROL) 10 MG tablet Take 10 mg by mouth daily.    . hydrochlorothiazide (HYDRODIURIL) 25 MG tablet Take 1 tablet (25 mg total) by mouth daily. 30 tablet 3  . hydrOXYzine (ATARAX/VISTARIL) 25 MG tablet Take 25 mg by mouth at bedtime.    . Insulin Syringes, Disposable, Z-169 1 ML MISC 1 application by Does not apply route 2 (two) times a day. 100 each 0  . isosorbide-hydrALAZINE (BIDIL) 20-37.5 MG tablet Take 2 tablets by mouth 3 (three) times daily. 180 tablet 2  . metFORMIN (GLUCOPHAGE) 1000 MG tablet Take 1,000 mg by mouth 2 (two) times daily.    . rosuvastatin (CRESTOR) 10 MG tablet Take 1 tablet (10 mg total) by mouth daily. 30 tablet 3  . vitamin B-12 (CYANOCOBALAMIN) 1000 MCG tablet Take 1,000 mcg by mouth daily.    . insulin aspart protamine- aspart (NOVOLOG MIX 70/30) (70-30) 100 UNIT/ML injection Inject 0.15 mLs (15 Units total) into the skin 2 (two) times daily with a meal. (Patient taking differently: Inject into the skin. 15 units in  the am,17 in the evening) 10 mL 11   No current facility-administered medications for this visit.    Review of Systems Review of Systems  Constitutional: Negative.   Respiratory: Negative.   Cardiovascular: Negative.   Gastrointestinal: Negative.   Genitourinary: Positive for menstrual problem. Negative for vaginal bleeding and vaginal discharge.    Blood pressure (!) 163/74, pulse 83, height 5' 9" (1.753 m), weight 250 lb 9.6 oz (113.7 kg), last menstrual period 11/23/2019.  Physical Exam Physical Exam Constitutional:      Appearance: She is obese. She is not ill-appearing.  Cardiovascular:     Rate and Rhythm: Normal rate.  Pulmonary:     Effort: Pulmonary effort is normal.  Abdominal:     Comments: Obese, not tender  Genitourinary:    General: Normal vulva.     Vagina: No vaginal discharge.     Comments: Pelvic exam: normal external genitalia, vulva, vagina, cervix, uterus and adnexa, pap obtained and STD test.   Breasts: breasts appear normal, no suspicious masses, no skin or nipple changes or axillary nodes.   Data Reviewed Pap nl 2017  Assessment Well woman exam with routine gynecological exam - Plan: Cytology - PAP( [place]), Cervicovaginal ancillary only( [place]), HIV antibody (with reflex), RPR, Hepatitis C Antibody, Hepatitis B Surface AntiGEN, MM DIGITAL SCREENING BILATERAL  Type 2 diabetes mellitus with vascular disease ([place])  H/O: stroke  Obesity (BMI 30-39.9)    Plan Dr. Jodi Mourning ordered pelvic US, will f/u with result  Orders Placed This Encounter  Procedures  . MM DIGITAL SCREENING BILATERAL  . HIV antibody (with reflex)  . RPR  . Hepatitis C Antibody  . Hepatitis B Surface AntiGEN   F/U in 12 months     Emeterio Reeve 12/12/2019, 10:27 AM

## 2019-12-12 NOTE — Progress Notes (Signed)
Pt is in the annual.  Last pap 2017 LMP 11-23-19 GAD-7= 21 Pt desires std testing.

## 2019-12-12 NOTE — Patient Instructions (Signed)

## 2019-12-13 LAB — RPR: RPR Ser Ql: NONREACTIVE

## 2019-12-13 LAB — CERVICOVAGINAL ANCILLARY ONLY
Bacterial Vaginitis (gardnerella): POSITIVE — AB
Candida Glabrata: NEGATIVE
Candida Vaginitis: NEGATIVE
Chlamydia: NEGATIVE
Comment: NEGATIVE
Comment: NEGATIVE
Comment: NEGATIVE
Comment: NEGATIVE
Comment: NEGATIVE
Comment: NORMAL
Neisseria Gonorrhea: NEGATIVE
Trichomonas: NEGATIVE

## 2019-12-13 LAB — HEPATITIS B SURFACE ANTIGEN: Hepatitis B Surface Ag: NEGATIVE

## 2019-12-13 LAB — HEPATITIS C ANTIBODY: Hep C Virus Ab: <0.1 s/co ratio (ref 0.0–0.9)

## 2019-12-13 LAB — HIV ANTIBODY (ROUTINE TESTING W REFLEX): HIV Screen 4th Generation wRfx: NONREACTIVE

## 2019-12-15 ENCOUNTER — Other Ambulatory Visit: Payer: Self-pay | Admitting: Obstetrics & Gynecology

## 2019-12-15 DIAGNOSIS — Z01419 Encounter for gynecological examination (general) (routine) without abnormal findings: Secondary | ICD-10-CM

## 2019-12-15 MED ORDER — METRONIDAZOLE 500 MG PO TABS: 500.0000 mg | ORAL_TABLET | Freq: Two times a day (BID) | ORAL | 0 refills | Status: AC

## 2019-12-17 LAB — CYTOLOGY - PAP
Adequacy: ABSENT
Comment: NEGATIVE
Diagnosis: NEGATIVE
High risk HPV: NEGATIVE

## 2019-12-23 ENCOUNTER — Ambulatory Visit
Admission: RE | Admit: 2019-12-23 | Discharge: 2019-12-23 | Disposition: A | Discharge status: Home / Self Care | Payer: Medicaid Other | Source: Ambulatory Visit | Attending: Obstetrics | Admitting: Obstetrics

## 2019-12-23 ENCOUNTER — Other Ambulatory Visit: Payer: Self-pay

## 2019-12-23 ENCOUNTER — Telehealth: Payer: Self-pay | Admitting: Pharmacist

## 2019-12-23 DIAGNOSIS — N939 Abnormal uterine and vaginal bleeding, unspecified: Secondary | ICD-10-CM | POA: Diagnosis not present
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
## 2019-12-27 ENCOUNTER — Ambulatory Visit (INDEPENDENT_AMBULATORY_CARE_PROVIDER_SITE_OTHER): Payer: Medicaid Other | Admitting: Obstetrics

## 2019-12-27 ENCOUNTER — Encounter: Payer: Self-pay | Admitting: Obstetrics

## 2019-12-27 ENCOUNTER — Other Ambulatory Visit: Payer: Self-pay

## 2019-12-27 ENCOUNTER — Other Ambulatory Visit (HOSPITAL_COMMUNITY)
Admission: RE | Admit: 2019-12-27 | Discharge: 2019-12-27 | Disposition: A | Discharge status: Home / Self Care | Payer: Medicaid Other | Source: Ambulatory Visit | Attending: Obstetrics | Admitting: Obstetrics

## 2019-12-27 VITALS — BP 160/77 | HR 73 | Ht 69.0 in | Wt 251.0 lb

## 2019-12-27 DIAGNOSIS — N939 Abnormal uterine and vaginal bleeding, unspecified: Secondary | ICD-10-CM | POA: Insufficient documentation

## 2019-12-27 DIAGNOSIS — E139 Other specified diabetes mellitus without complications: Secondary | ICD-10-CM

## 2019-12-27 DIAGNOSIS — F17200 Nicotine dependence, unspecified, uncomplicated: Secondary | ICD-10-CM

## 2019-12-27 DIAGNOSIS — E669 Obesity, unspecified: Secondary | ICD-10-CM

## 2019-12-27 NOTE — Progress Notes (Signed)
Endometrial Biopsy Procedure Note  Pre-operative Diagnosis: Abnormal Uterine Bleeding ( AUB )  Post-operative Diagnosis: same  Indications: abnormal uterine bleeding  Procedure Details   Urine pregnancy test was done in office and result was negative.  The risks (including infection, bleeding, pain, and uterine perforation) and benefits of the procedure were explained to the patient and Written informed consent was obtained.    The patient was placed in the dorsal lithotomy position.  Bimanual exam showed the uterus to be in the neutral position.  A Graves' speculum inserted in the vagina, and the cervix prepped with povidone iodine.  Endocervical curettage with a Kevorkian curette was not performed.   A sharp tenaculum was applied to the anterior lip of the cervix for stabilization.  A sterile uterine sound was used to sound the uterus to a depth of 8cm.  A Pipelle endometrial aspirator was used to sample the endometrium.  Sample was sent for pathologic examination.  Condition: Stable  Complications: None  Plan:  The patient was advised to call for any fever or for prolonged or severe pain or bleeding. She was advised to use NSAID as needed for mild to moderate pain. She was advised to avoid vaginal intercourse for 48 hours or until the bleeding has completely stopped.  Attending Physician Documentation: I was present for or participated in the entire procedure, including opening and closing.   Shelly Bombard, MD 12/27/2019 10:05 AM

## 2019-12-27 NOTE — Progress Notes (Signed)
Patient presents for Endometrial Biopsy. Patient has no concerns today.  Last Pap: 12/17/19 Normal

## 2019-12-27 NOTE — Addendum Note (Signed)
Addended by: Lucianne Lei on: 12/27/2019 10:48 AM   Modules accepted: Orders

## 2019-12-27 NOTE — Addendum Note (Signed)
Addended by: Lucianne Lei on: 12/27/2019 10:49 AM   Modules accepted: Orders

## 2019-12-30 LAB — SURGICAL PATHOLOGY

## 2020-01-10 ENCOUNTER — Telehealth: Payer: Self-pay | Admitting: Pharmacist

## 2020-01-13 NOTE — Telephone Encounter (Signed)
Called [name] for RPM follow. Morning BP readings continue to be elevated. Average morning BP readings over the past two weeks of ~168/92, while evening BP readings ~158/86. AM readings ranging from 128-186/69-106. [name] had an OV w/ her nephrologist on 6/4/2. Per office notes that were faxed, [name] notes to have increased lower extremity edema. BP elevated in relation to family stressors. Med List reviewed and updated. BP reading in the nephrology office of 162/80, HR: 73. Recent labs from 6/4 of Scr: 1.46, eGFr:49, K:3.6, BUN: 15. All other lab values WNL. Per nephrology, lasix dose increased to 40 mg BID. Return nephrology OV in 2 months. Reviewed results with [name]. [name] reports that she is stressed in the morning due to her children and is working using youtube medication videos to be able to effectively manage her morning BP readings. [name] would like [place] to continue monitoring her BP readings remotely but would prefer [place] keeping the nephrology office in close contact with any medication changes. Recommended [name] continue with being able to utilize the stress management routine of daily morning meditation to help address her morning stressors and being able to effectively reduce her morning BP readings.

## 2020-02-20 ENCOUNTER — Ambulatory Visit: Payer: Medicaid Other | Admitting: Cardiology

## 2020-02-27 ENCOUNTER — Other Ambulatory Visit: Payer: Self-pay | Admitting: Pharmacist

## 2020-02-27 DIAGNOSIS — I10 Essential (primary) hypertension: Secondary | ICD-10-CM

## 2020-02-27 MED ORDER — BIDIL 20-37.5 MG PO TABS: 2.0000 | ORAL_TABLET | Freq: Three times a day (TID) | ORAL | 3 refills | Status: DC

## 2020-02-27 MED ORDER — AMLODIPINE BESY-BENAZEPRIL HCL 10-40 MG PO CAPS: 1.0000 | ORAL_CAPSULE | Freq: Every day | ORAL | 2 refills | Status: DC

## 2020-02-27 MED ORDER — CLONIDINE HCL 0.2 MG PO TABS: 0.2000 mg | ORAL_TABLET | Freq: Two times a day (BID) | ORAL | 2 refills | Status: DC

## 2020-03-09 ENCOUNTER — Other Ambulatory Visit: Payer: Self-pay | Admitting: Physical Medicine and Rehabilitation

## 2020-03-09 ENCOUNTER — Other Ambulatory Visit: Payer: Self-pay | Admitting: Cardiology

## 2020-03-09 DIAGNOSIS — I6350 Cerebral infarction due to unspecified occlusion or stenosis of unspecified cerebral artery: Secondary | ICD-10-CM

## 2020-03-09 DIAGNOSIS — I639 Cerebral infarction, unspecified: Secondary | ICD-10-CM

## 2020-03-16 ENCOUNTER — Ambulatory Visit: Payer: Medicaid Other | Admitting: Neurology

## 2020-03-18 ENCOUNTER — Ambulatory Visit: Payer: Medicaid Other | Admitting: Cardiology

## 2020-03-18 ENCOUNTER — Encounter: Payer: Self-pay | Admitting: Cardiology

## 2020-03-18 ENCOUNTER — Other Ambulatory Visit: Payer: Self-pay

## 2020-03-18 VITALS — BP 183/93 | HR 95 | Resp 17 | Ht 69.0 in | Wt 241.8 lb

## 2020-03-18 DIAGNOSIS — I10 Essential (primary) hypertension: Secondary | ICD-10-CM

## 2020-03-18 DIAGNOSIS — E782 Mixed hyperlipidemia: Secondary | ICD-10-CM

## 2020-03-18 DIAGNOSIS — I517 Cardiomegaly: Secondary | ICD-10-CM
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
## 2020-03-20 NOTE — Progress Notes (Signed)
[facility] Note  03/27/2020     CHIEF COMPLAINT Patient presents for Retina Follow Up   HISTORY OF PRESENT ILLNESS: [name] is a 49 y.o. female who presents to the clinic today for:   HPI    Retina Follow Up    Patient presents with  Diabetic Retinopathy.  In both eyes.  Duration of 4 months.  Since onset it is gradually worsening.  I, the attending physician,  performed the HPI with the patient and updated documentation appropriately.          Comments    4 month follow up NPDR OU- For about a month eyes have been crusty with mucous.  Eyelids are stuck together in the mornings.  She went to her dads that has dogs that shed.  This is when this started.  She took allergy medicine that day but has not taken any since then.  Denies using eye drops.   BS: 107 yesterday  A1C: 7.0       Last edited by Bernarda Caffey, MD on 03/27/2020  1:08 PM. (History)    pt states she has been getting a lot of mucus in the corners of her eyes through out the day, she has been trying to use Vaseline in them at night to help, last A1c was 7.0 in December 2020, pt had a stroke in December 2020, but has not had any problems since then, she states she was just given a new BP med from her cardiologist and her BP numbers have improved  Referring physician: Gevena Cotton, MD [place],  [place] [address]  HISTORICAL INFORMATION:   Selected notes from the [facility] Referred by Dr. Gevena Cotton for concern of PDR OU   CURRENT MEDICATIONS: No current outpatient medications on file. (Ophthalmic Drugs)   No current facility-administered medications for this visit. (Ophthalmic Drugs)   Current Outpatient Medications (Other)  Medication Sig  . Accu-Chek FastClix Lancets MISC 4 (four) times daily.  Marland Kitchen ACCU-CHEK GUIDE test strip See admin instructions. for testing  . albuterol (VENTOLIN HFA) 108 (90 Base) MCG/ACT inhaler Inhale  into the lungs every 6 (six) hours as needed for wheezing or shortness of breath.  Marland Kitchen amLODipine-benazepril (LOTREL) 10-40 MG capsule Take 1 capsule by mouth daily.  Marland Kitchen aspirin EC 81 MG EC tablet Take 1 tablet (81 mg total) by mouth daily. (Patient not taking: Reported on 03/18/2020)  . blood glucose meter kit and supplies KIT Dispense based on patient and insurance preference. Use up to four times daily as directed. (FOR ICD-9 250.00, 250.01).  . carvedilol (COREG) 25 MG tablet Take 1 tablet (25 mg total) by mouth 2 (two) times daily with a meal.  . Cholecalciferol (VITAMIN D3) 25 MCG (1000 UT) CAPS Take 1 capsule by mouth daily.  . cloNIDine (CATAPRES) 0.2 MG tablet Take 1 tablet (0.2 mg total) by mouth 2 (two) times daily. (Patient not taking: Reported on 03/18/2020)  . folic acid (FOLVITE) 1 MG tablet Take 1 mg by mouth daily.  . furosemide (LASIX) 20 MG tablet Take 40 mg by mouth 2 (two) times daily.   Marland Kitchen glipiZIDE (GLUCOTROL) 10 MG tablet Take 10 mg by mouth daily.  . hydrOXYzine (ATARAX/VISTARIL) 25 MG tablet Take 25 mg by mouth at bedtime.  . insulin aspart protamine- aspart (NOVOLOG MIX 70/30) (70-30) 100 UNIT/ML injection Inject 0.15 mLs (15 Units total) into the skin 2 (two) times daily with  a meal. (Patient taking differently: Inject into the skin. 15 units in the am,17 in the evening)  . Insulin Syringes, Disposable, [id number] 1 ML MISC 1 application by Does not apply route 2 (two) times a day.  . isosorbide-hydrALAZINE (BIDIL) 20-37.5 MG tablet Take 2 tablets by mouth 3 (three) times daily.  . metFORMIN (GLUCOPHAGE) 1000 MG tablet Take 1,000 mg by mouth 2 (two) times daily.  . rosuvastatin (CRESTOR) 10 MG tablet TAKE 1 TABLET BY MOUTH EVERY DAY  . vitamin B-12 (CYANOCOBALAMIN) 1000 MCG tablet Take 1,000 mcg by mouth daily.   No current facility-administered medications for this visit. (Other)   REVIEW OF SYSTEMS: ROS    Positive for: Neurological, Genitourinary, Endocrine, Eyes,  Respiratory   Negative for: Constitutional, Gastrointestinal, Skin, Musculoskeletal, HENT, Cardiovascular, Psychiatric, Allergic/Imm, Heme/Lymph   Last edited by Leonie Douglas, COA on 03/27/2020  9:14 AM. (History)     ALLERGIES No Known Allergies  PAST MEDICAL HISTORY Past Medical History:  Diagnosis Date  . Arthritis   . Asthma   . Cataract    Mixed form OU  . CKD (chronic kidney disease)   . Coronary artery disease   . Diabetes mellitus   . Diabetic retinopathy ([place])    NPDR OU  . Hyperlipidemia   . Hypertension   . Hypertensive retinopathy    OU  . Left thyroid nodule    diagnosed 07/2018  . Pseudotumor cerebri   . Stroke ([place])   . Vitamin D deficiency    Past Surgical History:  Procedure Laterality Date  . ACHILLES TENDON REPAIR    . TUBAL LIGATION     FAMILY HISTORY Family History  Problem Relation Age of Onset  . Asthma Mother   . Hypertension Father   . Kidney disease Father   . Asthma Other   . Hyperlipidemia Other   . Hypertension Other   . Cancer Other   . Diabetes Maternal Grandmother   . Congestive Heart Failure Maternal Grandmother   . Hypertension Sister   . Asthma Sister   . Congestive Heart Failure Maternal Aunt    SOCIAL HISTORY Social History   Tobacco Use  . Smoking status: Former Smoker    Packs/day: 0.50    Years: 30.00    Pack years: 15.00    Types: Cigarettes    Quit date: 2017    Years since quitting: 4.6  . Smokeless tobacco: Former Systems developer    Quit date: 03/31/2016  Vaping Use  . Vaping Use: Never used  Substance Use Topics  . Alcohol use: Yes    Alcohol/week: 0.0 standard drinks    Comment: rare  . Drug use: Yes    Types: Marijuana    Comment: Daily         OPHTHALMIC EXAM:  Base Eye Exam    Visual Acuity (Snellen - Linear)      Right Left   Dist cc 20/50 20/50   Dist ph cc 20/40 NI       Tonometry (Tonopen, 9:26 AM)      Right Left   Pressure 19 18       Pupils      Dark Light Shape React APD   Right  3 2 Round Slow None   Left 3 2 Round Minimal None       Visual Fields (Counting fingers)      Left Right    Full Full       Extraocular Movement      Right  Left    Full Full       Neuro/Psych    Oriented x3: Yes   Mood/Affect: Normal       Dilation    Both eyes: 1.0% Mydriacyl, 2.5% Phenylephrine @ 9:26 AM        Slit Lamp and Fundus Exam    Slit Lamp Exam      Right Left   Lids/Lashes Mild Meibomian gland dysfunction Mild Meibomian gland dysfunction   Conjunctiva/Sclera Melanosis Melanosis   Cornea Focal NV and pigment at 0530 limbus Trace Punctate epithelial erosions   Anterior Chamber Deep and clear, narrow temporal angle Deep and clear, narrow temporal angle   Iris Round and moderately dilated to 69m, No NVI Round and moderately dilated to 653m No NVI   Lens 1-2+ Nuclear sclerosis, 1-2+ Cortical cataract 1-2+ Nuclear sclerosis, 1-2+ Cortical cataract   Vitreous Mild Vitreous syneresis Mild Vitreous syneresis       Fundus Exam      Right Left   Disc mild Pallor, Sharp rim, +elevation Pink and Sharp, mild tilt, mild temporal PPA, mild elevation   C/D Ratio 0.2 0.3   Macula Good foveal reflex, scattered MA and CWS, flame heme superior macula, scattered cystic changes blunted foveal reflex, +cystic changes centrally, scattered IRH, focal CWS greatest inferiorly   Vessels Vascular attenuation, Tortuous, mild AV crossing changes Vascular attenuation, Tortuous, mild AV crossing changes   Periphery Attached, scattered MA/IRH / DBH / CWS -- mostly posterior, early flat NV SN to disc, focal pigmented CR scar at 1030 Attached, scattered IRH/DBH greatest posteriorly        Refraction    Wearing Rx      Sphere Cylinder Axis   Right -1.00 +2.00 025   Left -1.25 +0.50 145       Manifest Refraction      Sphere Cylinder Axis Dist VA   Right -1.50 +2.00 180 20/30   Left -1.25 +0.50 145 NI          IMAGING AND PROCEDURES  Imaging and Procedures for '@TODAY'$ @  OCT,  Retina - OU - Both Eyes       Right Eye Quality was good. Central Foveal Thickness: 224. Progression has been stable. Findings include normal foveal contour, no SRF, intraretinal hyper-reflective material, intraretinal fluid (Peristent non-central cystic changes -- slightly improved from prior, disc edema).   Left Eye Quality was good. Central Foveal Thickness: 240. Progression has worsened. Findings include abnormal foveal contour, intraretinal fluid, intraretinal hyper-reflective material, no SRF, vitreomacular adhesion  (Interval increase in IRF temporal fovea, peristent non-central IRF, disc edema).   Notes *Images captured and stored on drive  Diagnosis / Impression:  OD: Peristent non-central cystic changes -- slightly improved from prior, +disc edema OS: Interval increase in IRF temporal fovea, peristent non-central IRF, +disc edema  Clinical management:  See below  Abbreviations: NFP - Normal foveal profile. CME - cystoid macular edema. PED - pigment epithelial detachment. IRF - intraretinal fluid. SRF - subretinal fluid. EZ - ellipsoid zone. ERM - epiretinal membrane. ORA - outer retinal atrophy. ORT - outer retinal tubulation. SRHM - subretinal hyper-reflective material                 ASSESSMENT/PLAN:    ICD-10-CM   1. Severe nonproliferative diabetic retinopathy of both eyes with macular edema associated with type 2 diabetes mellitus ([place] [id number] 2. Retinal edema  H35.81 OCT, Retina - OU - Both Eyes  3. Essential hypertension  I10  4. Combined forms of age-related cataract of both eyes  H25.813   5. History of stroke  Z86.73   6. Hypertensive retinopathy of both eyes  H35.033     1,2. 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  - exam shows scattered IRH and CWS OU  - OCT OS: Interval increase in IRF temporal fovea, peristent non-central IRF, +disc edema, OD: Peristent non-central cystic changes -- slightly improved from prior, +disc edema  -  FA (04.20.21) shows delayed filling time OS; no frank NV, but late leaking MA and scattered patches of capillary nonperfusion OU  - discussed findings and prognosis  - may need anti-VEGF for macular edema, but will hold off for now while BP is being worked on  - cleared from retina standpoint for regular appt with Dr. Frederico Hamman -- pt wishes to update spectacles  - f/u 3 months  3-5. Hypertensive retinopathy OU with history of recent stroke  - stroke July 2020 -- ischemic non-hemorrhagic infarct involving L lateral midbrain/pons  - BP in office on 11.23.2020 was  209/112 (left arm)  - 01.20.21, bp 160/87  - BP this morning (taken in office) 146/82   - repeat MRI brain done 01.12.21, which showed new subacute infarction R frontal parasagittal and anterior coprus callosum  - pt reports recent changes in BP meds  - discussed importance of tight BP control  - monitor  6. 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  - discussed diagnosis and progression  - not yet visually significant  - monitor for now  Ophthalmic Meds Ordered this visit:  No orders of the defined types were placed in this encounter.     Return in about 3 months (around 06/27/2020) for f/u NPDR OU, DFE, OCT.  There are no Patient Instructions on file for this visit.   Explained the diagnoses, plan, and follow up with the patient and they expressed understanding.  Patient expressed understanding of the importance of proper follow up care.   This document serves as a record of services personally performed by Gardiner Sleeper, MD, PhD. It was created on their behalf by San Jetty. Owens Shark, OA an ophthalmic technician. The creation of this record is the provider's dictation and/or activities during the visit.    Electronically signed by: San Jetty. Owens Shark, [place] 08.13.2021 1:11 PM  Gardiner Sleeper, M.D., Ph.D. Diseases & Surgery of the  Retina and Vitreous [facility]\  I have reviewed the above documentation for accuracy and completeness, and I agree with the above. Gardiner Sleeper, M.D., Ph.D. 03/27/20 1:11 PM   Abbreviations: M myopia (nearsighted); A astigmatism; H hyperopia (farsighted); P presbyopia; Mrx spectacle prescription;  CTL contact lenses; OD right eye; OS left eye; OU both eyes  XT exotropia; ET esotropia; PEK punctate epithelial keratitis; PEE punctate epithelial erosions; DES dry eye syndrome; MGD meibomian gland dysfunction; ATs artificial tears; PFAT's preservative free artificial tears; [place] nuclear sclerotic cataract; PSC posterior subcapsular cataract; ERM epi-retinal membrane; PVD posterior vitreous detachment; RD retinal detachment; DM diabetes mellitus; DR diabetic retinopathy; NPDR non-proliferative diabetic retinopathy; PDR proliferative diabetic retinopathy; CSME clinically significant macular edema; DME diabetic macular edema; dbh dot blot hemorrhages; CWS cotton wool spot; POAG primary open angle glaucoma; C/D cup-to-disc ratio; HVF humphrey visual field; GVF goldmann visual field; OCT optical coherence tomography; IOP intraocular pressure; BRVO Branch retinal vein occlusion; CRVO central retinal vein occlusion; CRAO  central retinal artery occlusion; BRAO branch retinal artery occlusion; RT retinal tear; SB scleral buckle; PPV pars plana vitrectomy; VH Vitreous hemorrhage; PRP panretinal laser photocoagulation; IVK intravitreal kenalog; VMT vitreomacular traction; MH Macular hole;  NVD neovascularization of the disc; NVE neovascularization elsewhere; AREDS age related eye disease study; ARMD age related macular degeneration; POAG primary open angle glaucoma; EBMD epithelial/anterior basement membrane dystrophy; ACIOL anterior chamber intraocular lens; IOL intraocular lens; PCIOL posterior chamber intraocular lens; Phaco/IOL phacoemulsification with intraocular lens placement; [place]  photorefractive keratectomy; LASIK laser assisted in situ keratomileusis; HTN hypertension; DM diabetes mellitus; COPD chronic obstructive pulmonary disease

## 2020-03-27 ENCOUNTER — Encounter (INDEPENDENT_AMBULATORY_CARE_PROVIDER_SITE_OTHER): Payer: Self-pay | Admitting: Ophthalmology

## 2020-03-27 ENCOUNTER — Other Ambulatory Visit: Payer: Self-pay

## 2020-03-27 ENCOUNTER — Ambulatory Visit (INDEPENDENT_AMBULATORY_CARE_PROVIDER_SITE_OTHER): Payer: Medicaid Other | Admitting: Ophthalmology

## 2020-03-27 VITALS — BP 146/82

## 2020-03-27 DIAGNOSIS — H25813 Combined forms of age-related cataract, bilateral: Secondary | ICD-10-CM | POA: Diagnosis not present

## 2020-03-27 DIAGNOSIS — E113413 Type 2 diabetes mellitus with severe nonproliferative diabetic retinopathy with macular edema, bilateral: Secondary | ICD-10-CM

## 2020-03-27 DIAGNOSIS — H3581 Retinal edema: Secondary | ICD-10-CM | POA: Diagnosis not present

## 2020-03-27 DIAGNOSIS — I10 Essential (primary) hypertension: Secondary | ICD-10-CM | POA: Diagnosis not present

## 2020-03-27 DIAGNOSIS — H35033 Hypertensive retinopathy, bilateral: Secondary | ICD-10-CM

## 2020-03-27 DIAGNOSIS — Z8673 Personal history of transient ischemic attack (TIA), and cerebral infarction without residual deficits: Secondary | ICD-10-CM

## 2020-03-30 ENCOUNTER — Other Ambulatory Visit (HOSPITAL_COMMUNITY)
Admission: RE | Admit: 2020-03-30 | Discharge: 2020-03-30 | Disposition: A | Discharge status: Home / Self Care | Payer: Medicaid Other | Source: Ambulatory Visit | Attending: Obstetrics | Admitting: Obstetrics

## 2020-03-30 ENCOUNTER — Other Ambulatory Visit: Payer: Self-pay

## 2020-03-30 ENCOUNTER — Emergency Department (HOSPITAL_COMMUNITY)
Admission: EM | Admit: 2020-03-30 | Discharge: 2020-03-30 | Disposition: A | Discharge status: Home / Self Care | Payer: Medicaid Other | Attending: Emergency Medicine | Admitting: Emergency Medicine

## 2020-03-30 ENCOUNTER — Ambulatory Visit: Payer: Medicaid Other | Admitting: Obstetrics

## 2020-03-30 ENCOUNTER — Encounter (HOSPITAL_COMMUNITY): Payer: Self-pay

## 2020-03-30 VITALS — BP 190/102 | HR 97 | Ht 69.0 in | Wt 239.5 lb

## 2020-03-30 DIAGNOSIS — F17200 Nicotine dependence, unspecified, uncomplicated: Secondary | ICD-10-CM | POA: Diagnosis not present

## 2020-03-30 DIAGNOSIS — Z5321 Procedure and treatment not carried out due to patient leaving prior to being seen by health care provider: Secondary | ICD-10-CM | POA: Diagnosis not present

## 2020-03-30 DIAGNOSIS — N939 Abnormal uterine and vaginal bleeding, unspecified: Secondary | ICD-10-CM | POA: Diagnosis not present

## 2020-03-30 DIAGNOSIS — I10 Essential (primary) hypertension: Secondary | ICD-10-CM

## 2020-03-30 DIAGNOSIS — E119 Type 2 diabetes mellitus without complications: Secondary | ICD-10-CM

## 2020-03-30 DIAGNOSIS — E669 Obesity, unspecified: Secondary | ICD-10-CM | POA: Diagnosis not present

## 2020-03-30 DIAGNOSIS — Z8673 Personal history of transient ischemic attack (TIA), and cerebral infarction without residual deficits: Secondary | ICD-10-CM

## 2020-03-30 DIAGNOSIS — Z6836 Body mass index (BMI) 36.0-36.9, adult: Secondary | ICD-10-CM | POA: Insufficient documentation

## 2020-03-30 DIAGNOSIS — E139 Other specified diabetes mellitus without complications: Secondary | ICD-10-CM

## 2020-03-30 DIAGNOSIS — R030 Elevated blood-pressure reading, without diagnosis of hypertension: Secondary | ICD-10-CM | POA: Diagnosis present

## 2020-03-30 LAB — CBC
HCT: 26.7 % — ABNORMAL LOW (ref 36.0–46.0)
Hemoglobin: 8.3 g/dL — ABNORMAL LOW (ref 12.0–15.0)
MCH: 27.2 pg (ref 26.0–34.0)
MCHC: 31.1 g/dL (ref 30.0–36.0)
MCV: 87.5 fL (ref 80.0–100.0)
Platelets: 315 10*3/uL (ref 150–400)
RBC: 3.05 MIL/uL — ABNORMAL LOW (ref 3.87–5.11)
RDW: 15.1 % (ref 11.5–15.5)
WBC: 9.4 10*3/uL (ref 4.0–10.5)
nRBC: 0 % (ref 0.0–0.2)

## 2020-03-30 LAB — BASIC METABOLIC PANEL
Anion gap: 10 (ref 5–15)
BUN: 12 mg/dL (ref 6–20)
CO2: 27 mmol/L (ref 22–32)
Calcium: 9.1 mg/dL (ref 8.9–10.3)
Chloride: 99 mmol/L (ref 98–111)
Creatinine, Ser: 1.42 mg/dL — ABNORMAL HIGH (ref 0.44–1.00)
GFR calc Af Amer: 51 mL/min — ABNORMAL LOW (ref >60)
GFR calc non Af Amer: 44 mL/min — ABNORMAL LOW (ref >60)
Glucose, Bld: 187 mg/dL — ABNORMAL HIGH (ref 70–99)
Potassium: 3.9 mmol/L (ref 3.5–5.1)
Sodium: 136 mmol/L (ref 135–145)

## 2020-03-30 NOTE — ED Triage Notes (Addendum)
Patient was sent by her GYN today following appointment to be evaluated for elevated BP. States that she takes her meds daily but did not take this am. No concerns. States anxious for having to come and slight headache

## 2020-03-30 NOTE — Addendum Note (Signed)
Addended by: Huey Bienenstock on: 03/30/2020 03:57 PM   Modules accepted: Orders

## 2020-03-30 NOTE — Progress Notes (Signed)
Patient ID: [name], female   DOB: 18-Mar-1971, 49 y.o.   MRN: [id number]    HPI [name] is a 49 y.o. female.  Heavy vaginal bleeding with clots.  Periods have been irregular since her last stroke. HPI  Past Medical History:  Diagnosis Date  . Arthritis   . Asthma   . Cataract    Mixed form OU  . CKD (chronic kidney disease)   . Coronary artery disease   . Diabetes mellitus   . Diabetic retinopathy ([place])    NPDR OU  . Hyperlipidemia   . Hypertension   . Hypertensive retinopathy    OU  . Left thyroid nodule    diagnosed 07/2018  . Pseudotumor cerebri   . Stroke ([place])   . Vitamin D deficiency     Past Surgical History:  Procedure Laterality Date  . ACHILLES TENDON REPAIR    . TUBAL LIGATION      Family History  Problem Relation Age of Onset  . Asthma Mother   . Hypertension Father   . Kidney disease Father   . Asthma Other   . Hyperlipidemia Other   . Hypertension Other   . Cancer Other   . Diabetes Maternal Grandmother   . Congestive Heart Failure Maternal Grandmother   . Hypertension Sister   . Asthma Sister   . Congestive Heart Failure Maternal Aunt     Social History Social History   Tobacco Use  . Smoking status: Former Smoker    Packs/day: 0.50    Years: 30.00    Pack years: 15.00    Types: Cigarettes    Quit date: 2017    Years since quitting: 4.6  . Smokeless tobacco: Former Systems developer    Quit date: 03/31/2016  Vaping Use  . Vaping Use: Never used  Substance Use Topics  . Alcohol use: Yes    Alcohol/week: 0.0 standard drinks    Comment: rare  . Drug use: Yes    Types: Marijuana    Comment: Daily    No Known Allergies  Current Outpatient Medications  Medication Sig Dispense Refill  . albuterol (VENTOLIN HFA) 108 (90 Base) MCG/ACT inhaler Inhale into the lungs every 6 (six) hours as needed for wheezing or shortness of breath.    Marland Kitchen amLODipine-benazepril (LOTREL) 10-40 MG capsule Take 1 capsule by mouth daily. 90 capsule 2  .  carvedilol (COREG) 25 MG tablet Take 1 tablet (25 mg total) by mouth 2 (two) times daily with a meal. 180 tablet 2  . Cholecalciferol (VITAMIN D3) 25 MCG (1000 UT) CAPS Take 1 capsule by mouth daily.    . folic acid (FOLVITE) 1 MG tablet Take 1 mg by mouth daily.    . furosemide (LASIX) 20 MG tablet Take 40 mg by mouth 2 (two) times daily.     Marland Kitchen glipiZIDE (GLUCOTROL) 10 MG tablet Take 10 mg by mouth daily.    . hydrOXYzine (ATARAX/VISTARIL) 25 MG tablet Take 25 mg by mouth at bedtime.    . insulin aspart protamine- aspart (NOVOLOG MIX 70/30) (70-30) 100 UNIT/ML injection Inject 0.15 mLs (15 Units total) into the skin 2 (two) times daily with a meal. (Patient taking differently: Inject into the skin. 15 units in the am,17 in the evening) 10 mL 11  . Insulin Syringes, Disposable, [id number] 1 ML MISC 1 application by Does not apply route 2 (two) times a day. 100 each 0  . isosorbide-hydrALAZINE (BIDIL) 20-37.5 MG tablet Take 2 tablets by mouth  3 (three) times daily. 180 tablet 3  . metFORMIN (GLUCOPHAGE) 1000 MG tablet Take 1,000 mg by mouth 2 (two) times daily.    . rosuvastatin (CRESTOR) 10 MG tablet TAKE 1 TABLET BY MOUTH EVERY DAY 90 tablet 3  . vitamin B-12 (CYANOCOBALAMIN) 1000 MCG tablet Take 1,000 mcg by mouth daily.    . Accu-Chek FastClix Lancets MISC 4 (four) times daily.    Marland Kitchen ACCU-CHEK GUIDE test strip See admin instructions. for testing    . aspirin EC 81 MG EC tablet Take 1 tablet (81 mg total) by mouth daily. (Patient not taking: Reported on 03/18/2020)    . blood glucose meter kit and supplies KIT Dispense based on patient and insurance preference. Use up to four times daily as directed. (FOR ICD-9 250.00, 250.01). 1 each 0  . cloNIDine (CATAPRES) 0.2 MG tablet Take 1 tablet (0.2 mg total) by mouth 2 (two) times daily. (Patient not taking: Reported on 03/18/2020) 60 tablet 2   No current facility-administered medications for this visit.    Review of Systems Review of  Systems Constitutional: negative for fatigue and weight loss Respiratory: negative for cough and wheezing Cardiovascular: negative for chest pain, fatigue and palpitations Gastrointestinal: negative for abdominal pain and change in bowel habits Genitourinary:positive for heavy periods Integument/breast: negative for nipple discharge Musculoskeletal:negative for myalgias Neurological: negative for gait problems and tremors Behavioral/Psych: negative for abusive relationship, depression Endocrine: negative for temperature intolerance      Blood pressure (!) 190/102, pulse 97, height 5' 9"  (1.753 m), weight 239 lb 8 oz (108.6 kg).  Physical Exam Physical Exam General:   alert and no distress  Skin:   no rash or abnormalities  Lungs:   clear to auscultation bilaterally  Heart:   regular rate and rhythm, S1, S2 normal, no murmur, click, rub or gallop  Breasts:   normal without suspicious masses, skin or nipple changes or axillary nodes  Abdomen:  normal findings: no organomegaly, soft, non-tender and no hernia  Pelvis:  External genitalia: normal general appearance Urinary system: urethral meatus normal and bladder without fullness, nontender Vaginal: normal without tenderness, induration or masses Cervix: normal appearance Adnexa: normal bimanual exam Uterus: anteverted and non-tender, normal size    50% of 20 min visit spent on counseling and coordination of care.   Data Reviewed Wet Prep  Assessment     1. Abnormal uterine bleeding (AUB) Rx: - CBC - Comprehensive metabolic panel - TSH - US PELVIC COMPLETE WITH TRANSVAGINAL; Future  2. Diabetes 1.5, managed as type 2 ([place]) - managed by PCP  3. Obesity (BMI 30-39.9)  4. Tobacco dependence - tobacco cessation recommended  5. Essential hypertension - managed by PCP  6. H/O: stroke - clinically stable Follow up as needed.   Plan   Patient sent to [facility] ER for elevated BP management   Follow up in 2 weeks  Orders  Placed This Encounter  Procedures  . US PELVIC COMPLETE WITH TRANSVAGINAL    Standing Status:   Future    Standing Expiration Date:   03/30/2021    Order Specific Question:   Reason for Exam (SYMPTOM  OR DIAGNOSIS REQUIRED)    Answer:   AUB    Order Specific Question:   Preferred imaging location?    Answer:   [facility] Ultrasound  . CBC  . Comprehensive metabolic panel  . TSH     Need to obtain previous records  Shelly Bombard, MD 03/30/2020 10:23 AM    Pt. States she is passing "clots" as big as her hand. Pt. States this has been going on for the last month. Pt. Is not on any b/c at this time.   Last pap: 12/12/2019

## 2020-03-31 LAB — COMPREHENSIVE METABOLIC PANEL
ALT: 15 IU/L (ref 0–32)
AST: 11 IU/L (ref 0–40)
Albumin/Globulin Ratio: 1.3 (ref 1.2–2.2)
Albumin: 4 g/dL (ref 3.8–4.8)
Alkaline Phosphatase: 72 IU/L (ref 48–121)
BUN/Creatinine Ratio: 9 (ref 9–23)
BUN: 12 mg/dL (ref 6–24)
Bilirubin Total: 0.2 mg/dL (ref 0.0–1.2)
CO2: 24 mmol/L (ref 20–29)
Calcium: 9.3 mg/dL (ref 8.7–10.2)
Chloride: 100 mmol/L (ref 96–106)
Creatinine, Ser: 1.36 mg/dL — ABNORMAL HIGH (ref 0.57–1.00)
GFR calc Af Amer: 53 mL/min/{1.73_m2} — ABNORMAL LOW (ref >59)
GFR calc non Af Amer: 46 mL/min/{1.73_m2} — ABNORMAL LOW (ref >59)
Globulin, Total: 3.2 g/dL (ref 1.5–4.5)
Glucose: 212 mg/dL — ABNORMAL HIGH (ref 65–99)
Potassium: 4.1 mmol/L (ref 3.5–5.2)
Sodium: 138 mmol/L (ref 134–144)
Total Protein: 7.2 g/dL (ref 6.0–8.5)

## 2020-03-31 LAB — CERVICOVAGINAL ANCILLARY ONLY
Bacterial Vaginitis (gardnerella): POSITIVE — AB
Candida Glabrata: NEGATIVE
Candida Vaginitis: NEGATIVE
Chlamydia: NEGATIVE
Comment: NEGATIVE
Comment: NEGATIVE
Comment: NEGATIVE
Comment: NEGATIVE
Comment: NEGATIVE
Comment: NORMAL
Neisseria Gonorrhea: NEGATIVE
Trichomonas: NEGATIVE

## 2020-03-31 LAB — CBC
Hematocrit: 26.5 % — ABNORMAL LOW (ref 34.0–46.6)
Hemoglobin: 8.4 g/dL — ABNORMAL LOW (ref 11.1–15.9)
MCH: 27.5 pg (ref 26.6–33.0)
MCHC: 31.7 g/dL (ref 31.5–35.7)
MCV: 87 fL (ref 79–97)
Platelets: 300 10*3/uL (ref 150–450)
RBC: 3.05 x10E6/uL — ABNORMAL LOW (ref 3.77–5.28)
RDW: 14.3 % (ref 11.7–15.4)
WBC: 9.5 10*3/uL (ref 3.4–10.8)

## 2020-03-31 LAB — TSH: TSH: 1.47 u[IU]/mL (ref 0.450–4.500)

## 2020-04-01 ENCOUNTER — Other Ambulatory Visit: Payer: Self-pay | Admitting: Obstetrics

## 2020-04-01 DIAGNOSIS — B9689 Other specified bacterial agents as the cause of diseases classified elsewhere: Secondary | ICD-10-CM

## 2020-04-01 DIAGNOSIS — N760 Acute vaginitis: Secondary | ICD-10-CM

## 2020-04-01 MED ORDER — METRONIDAZOLE 500 MG PO TABS: 500.0000 mg | ORAL_TABLET | Freq: Two times a day (BID) | ORAL | 2 refills | Status: DC

## 2020-04-01 NOTE — Progress Notes (Signed)
Pt.notified

## 2020-04-02 ENCOUNTER — Ambulatory Visit: Payer: Medicaid Other | Admitting: Neurology

## 2020-04-06 ENCOUNTER — Other Ambulatory Visit: Payer: Self-pay

## 2020-04-06 ENCOUNTER — Ambulatory Visit
Admission: RE | Admit: 2020-04-06 | Discharge: 2020-04-06 | Disposition: A | Discharge status: Home / Self Care | Payer: Medicaid Other | Source: Ambulatory Visit | Attending: Obstetrics | Admitting: Obstetrics

## 2020-04-06 DIAGNOSIS — N939 Abnormal uterine and vaginal bleeding, unspecified: Secondary | ICD-10-CM

## 2020-04-15 ENCOUNTER — Telehealth (INDEPENDENT_AMBULATORY_CARE_PROVIDER_SITE_OTHER): Payer: Medicaid Other | Admitting: Obstetrics

## 2020-04-15 DIAGNOSIS — E139 Other specified diabetes mellitus without complications: Secondary | ICD-10-CM | POA: Diagnosis not present

## 2020-04-15 DIAGNOSIS — E669 Obesity, unspecified: Secondary | ICD-10-CM

## 2020-04-15 DIAGNOSIS — Z1239 Encounter for other screening for malignant neoplasm of breast: Secondary | ICD-10-CM

## 2020-04-15 DIAGNOSIS — Z8673 Personal history of transient ischemic attack (TIA), and cerebral infarction without residual deficits: Secondary | ICD-10-CM

## 2020-04-15 DIAGNOSIS — I10 Essential (primary) hypertension: Secondary | ICD-10-CM | POA: Diagnosis not present

## 2020-04-15 DIAGNOSIS — N939 Abnormal uterine and vaginal bleeding, unspecified: Secondary | ICD-10-CM | POA: Diagnosis not present

## 2020-04-15 NOTE — Progress Notes (Signed)
S/w pt for virtual visit to discuss U/S results from 04-06-20.

## 2020-04-15 NOTE — Progress Notes (Signed)
GYNECOLOGY VIRTUAL VISIT ENCOUNTER NOTE  Provider location: [facility] [place] [facility] [place]   I connected with [name] on 04/15/20 at  9:15 AM EDT by MyChart Video Encounter at home and verified that I am speaking with the correct person using two identifiers.   I discussed the limitations, risks, security and privacy concerns of performing an evaluation and management service virtually and the availability of in person appointments. I also discussed with the patient that there may be a patient responsible charge related to this service. The patient expressed understanding and agreed to proceed.   History:  [name] is a 49 y.o. [phone] female being evaluated today for AUB. She denies any abnormal vaginal discharge, bleeding, pelvic pain or other concerns.       Past Medical History:  Diagnosis Date  . Arthritis   . Asthma   . Cataract    Mixed form OU  . CKD (chronic kidney disease)   . Coronary artery disease   . Diabetes mellitus   . Diabetic retinopathy ([place])    NPDR OU  . Hyperlipidemia   . Hypertension   . Hypertensive retinopathy    OU  . Left thyroid nodule    diagnosed 07/2018  . Pseudotumor cerebri   . Stroke ([place])   . Vitamin D deficiency    Past Surgical History:  Procedure Laterality Date  . ACHILLES TENDON REPAIR    . TUBAL LIGATION     The following portions of the patient's history were reviewed and updated as appropriate: allergies, current medications, past family history, past medical history, past social history, past surgical history and problem list.   Health Maintenance:  Normal pap and negative HRHPV on 12-12-2019.  Normal mammogram on 11-04-2014.   Review of Systems:  Pertinent items noted in HPI and remainder of comprehensive ROS otherwise negative.  Physical Exam:   General:  Alert, oriented and cooperative. Patient appears to be in no acute distress.  Mental Status: Normal mood and affect. Normal behavior. Normal  judgment and thought content.   Respiratory: Normal respiratory effort, no problems with respiration noted  Rest of physical exam deferred due to type of encounter  Labs and Imaging No results found for this or any previous visit (from the past 336 hour(s)). OCT, Retina - OU - Both Eyes  Result Date: 03/27/2020 Right Eye Quality was good. Central Foveal Thickness: 224. Progression has been stable. Findings include normal foveal contour, no SRF, intraretinal hyper-reflective material, intraretinal fluid (Peristent non-central cystic changes -- slightly improved from prior, disc edema). Left Eye Quality was good. Central Foveal Thickness: 240. Progression has worsened. Findings include abnormal foveal contour, intraretinal fluid, intraretinal hyper-reflective material, no SRF, vitreomacular adhesion  (Interval increase in IRF temporal fovea, peristent non-central IRF, disc edema). Notes *Images captured and stored on drive Diagnosis / Impression: OD: Peristent non-central cystic changes -- slightly improved from prior, +disc edema OS: Interval increase in IRF temporal fovea, peristent non-central IRF, +disc edema Clinical management: See below Abbreviations: NFP - Normal foveal profile. CME - cystoid macular edema. PED - pigment epithelial detachment. IRF - intraretinal fluid. SRF - subretinal fluid. EZ - ellipsoid zone. ERM - epiretinal membrane. ORA - outer retinal atrophy. ORT - outer retinal tubulation. SRHM - subretinal hyper-reflective material   US PELVIC COMPLETE WITH TRANSVAGINAL  Result Date: 04/06/2020 CLINICAL DATA:  Abnormal uterine bleeding; LMP 11/23/2019 EXAM: TRANSABDOMINAL AND TRANSVAGINAL ULTRASOUND OF PELVIS TECHNIQUE: Both transabdominal and transvaginal ultrasound examinations of the  pelvis were performed. Transabdominal technique was performed for global imaging of the pelvis including uterus, ovaries, adnexal regions, and pelvic cul-de-sac. It was necessary to proceed with  endovaginal exam following the transabdominal exam to visualize the endometrium and LEFT ovary. COMPARISON:  12/23/2019 FINDINGS: Uterus Measurements: 9.0 x 5.2 x 6.1 cm = volume: 151 mL. Anteverted. Heterogeneous myometrium. Linear areas of shadowing. Cannot exclude adenomyosis. No focal uterine mass. Multiple nabothian cysts at cervix. Endometrium Thickness: 8 mm.  No endometrial fluid or focal abnormality. Right ovary Measurements: 3.5 x 2.8 x 3.8 cm = volume: 90.7 mL. Seen only on transabdominal imaging. Normal morphology without mass Left ovary Measurements: 4.1 x 2.3 x 3.0 cm = volume: 14.4 mL. Normal morphology without mass Other findings No free pelvic fluid or adnexal masses. IMPRESSION: Heterogeneous myometrium with scattered areas of shadowing, cannot exclude adenomyosis. Remainder of exam unremarkable. Specifically, endometrial complex 8 mm thick, normal for a premenopausal patient; if bleeding remains unresponsive to hormonal or medical therapy, sonohysterogram should be considered for focal lesion work-up. (Ref: Radiological Reasoning: Algorithmic Workup of Abnormal Vaginal Bleeding with Endovaginal Sonography and Sonohysterography. AJR 2008; [id number]) Electronically Signed   By: Lavonia Dana M.D.   On: 04/06/2020 09:15        ENDOMETRIAL BIOPSY: Results Surgical pathology( [facility]) (Order [id number]) MyChart Results Release  MyChart Status: Active Results Release  Surgical pathology( [facility]/ [facility]) Order: [id number] Status:  Edited Result - FINAL Visible to patient:  Yes (seen) Next appt:  04/29/2020 at 04:15 PM in [facility] Esther Hardy, MD) Dx:  Abnormal uterine bleeding (AUB)  0 Result Notes   Assessment and Plan:     1. Abnormal uterine bleeding (AUB) - clinically stable.  No bleeding since U/S - endometrial biopsy is WNL's - ultrasound is WNL's with a heterogenous myometrium suggestive of adenomyosis and an endometrial stripe of 47mm - if  AUB returns will Rx a course of Megace ( h/o of stroke, so must avoid estrogens )  2. Diabetes 1.5, managed as type 2 ([place]) - stable. Managed by PCP.  3. Obesity (BMI 30-39.9) - program of caloric reduction, exercise and behavioral modification recommended  4. Essential hypertension - stable.  Managed by PCP  5. H/O: stroke - clinically stable       I discussed the assessment and treatment plan with the patient. The patient was provided an opportunity to ask questions and all were answered. The patient agreed with the plan and demonstrated an understanding of the instructions.   The patient was advised to call back or seek an in-person evaluation/go to the ED if the symptoms worsen or if the condition fails to improve as anticipated.  I provided 15 minutes of face-to-face time during this encounter.   Baltazar Najjar, MD [facility] [place] [facility] 04/15/20

## 2020-04-28 NOTE — Progress Notes (Signed)
  Patient referred by Osei-Bonsu, George, MD for hypertensive heart disease  Subjective:   [name], female    DOB: 12/29/1970, 48 y.o.   MRN: [id number]   Chief Complaint  Patient presents with  . Hypertension  . Follow-up    6 week    48 y.o. African-American female with hypertension, uncontrolled type 2 diabetes mellitus, hyperlipidemia, tobacco abuse, recurrent strokes.  She is doing well. Avg BP 157/83 mmHg, 130s, 140s at times. HR 78. She did not tolerate clonidine due to "feeling loopy". Nephrology is considering starting her on clonidine.    Current Outpatient Medications on File Prior to Visit  Medication Sig Dispense Refill  . Accu-Chek FastClix Lancets MISC 4 (four) times daily.    . ACCU-CHEK GUIDE test strip See admin instructions. for testing    . albuterol (VENTOLIN HFA) 108 (90 Base) MCG/ACT inhaler Inhale into the lungs every 6 (six) hours as needed for wheezing or shortness of breath.    . amLODipine-benazepril (LOTREL) 10-40 MG capsule Take 1 capsule by mouth daily. 90 capsule 2  . aspirin EC 81 MG EC tablet Take 1 tablet (81 mg total) by mouth daily. (Patient not taking: Reported on 03/18/2020)    . blood glucose meter kit and supplies KIT Dispense based on patient and insurance preference. Use up to four times daily as directed. (FOR ICD-9 250.00, 250.01). 1 each 0  . carvedilol (COREG) 25 MG tablet Take 1 tablet (25 mg total) by mouth 2 (two) times daily with a meal. 180 tablet 2  . Cholecalciferol (VITAMIN D3) 25 MCG (1000 UT) CAPS Take 1 capsule by mouth daily.    . cloNIDine (CATAPRES) 0.2 MG tablet Take 1 tablet (0.2 mg total) by mouth 2 (two) times daily. (Patient not taking: Reported on 03/18/2020) 60 tablet 2  . folic acid (FOLVITE) 1 MG tablet Take 1 mg by mouth daily.    . furosemide (LASIX) 20 MG tablet Take 40 mg by mouth 2 (two) times daily.     . glipiZIDE (GLUCOTROL) 10 MG tablet Take 10 mg by mouth daily.    . hydrOXYzine (ATARAX/VISTARIL)  25 MG tablet Take 25 mg by mouth at bedtime.    . insulin aspart protamine- aspart (NOVOLOG MIX 70/30) (70-30) 100 UNIT/ML injection Inject 0.15 mLs (15 Units total) into the skin 2 (two) times daily with a meal. (Patient taking differently: Inject into the skin. 15 units in the am,17 in the evening) 10 mL 11  . Insulin Syringes, Disposable, U-100 1 ML MISC 1 application by Does not apply route 2 (two) times a day. 100 each 0  . isosorbide-hydrALAZINE (BIDIL) 20-37.5 MG tablet Take 2 tablets by mouth 3 (three) times daily. 180 tablet 3  . metFORMIN (GLUCOPHAGE) 1000 MG tablet Take 1,000 mg by mouth 2 (two) times daily.    . metroNIDAZOLE (FLAGYL) 500 MG tablet Take 1 tablet (500 mg total) by mouth 2 (two) times daily. 14 tablet 2  . rosuvastatin (CRESTOR) 10 MG tablet TAKE 1 TABLET BY MOUTH EVERY DAY 90 tablet 3  . vitamin B-12 (CYANOCOBALAMIN) 1000 MCG tablet Take 1,000 mcg by mouth daily.     No current facility-administered medications on file prior to visit.    Cardiovascular studies:  EKG 03/18/2020: Sinus rhythm 92 bpm. Left atrial enlargement.  Old anteroseptal infarct.  Diffuse nonspecific T-abnormality.   Event monitor 09/17/2019 - 10/16/2019: Diagnostic time: 94%  Dominant rhythm: Sinus. HR 66-112 bpm. Avg HR 80 bpm. 6 episodes of   NSVT up to 9 beats seen.  No atrial fibrillation/atrial flutter/SVT/high grade AV block, sinus pause >3sec noted. Symptoms reported: None  Echocardiogram 10/17/2019:  Normal LV systolic function with visual EF 50-55%. Left ventricle cavity  is normal in size. Normal global wall motion. Doppler evidence of grade II  (pseudonormal) diastolic dysfunction, elevated LAP. Severe left  ventricular hypertrophy. No obvious regional wall motion abnormalities.  Calculated EF 57%.  Left atrial cavity is moderate to severely dilated.  Mild (Grade I) aortic regurgitation.  Mild to moderate mitral regurgitation.  Mild tricuspid regurgitation. RVSP measures 33  mmHg.  Mild pulmonic regurgitation.  Small pericardial effusion with no hemodynamic compromise.  Prior study dated 07/20/2018: LVEF 45-50%, Grade II diastolic dysfunction,  severely dilated LA, Mild AR, Mild MR, insignificant pericardial effusion.   MRI brain 08/20/2019: - Right frontal parasagittal and anterior corpus callosum lesion which is hyperintense on DWI, slightly hypointense on ADC map, hyperintense on T2 and FLAIR views, with heterogeneous enhancement.  Considerations could include subacute infarction, autoimmune, inflammatory or neoplastic etiologies. This is a new finding compared to 02/11/2019. - Mild scattered periventricular, subcortical, pontine chronic small vessel ischemic disease.  CTA head/neck 02/12/2019: 1. Diffuse distal branch vessel irregularity throughout the circle-of-Willis without a significant proximal stenosis or occlusion. 2. Atherosclerotic changes are evident within the cavernous internal carotid arteries and right greater than left M1 segments with diffuse vessel irregularity and mild narrowing of less than 50% relative to the more distal vessels. 3. Tortuosity of the cervical internal carotid arteries and vertebral arteries bilaterally without significant stenosis. This is nonspecific, but often seen in the setting of chronic hypertension. 4. Brainstem infarct and white matter parenchymal changes noted on MRI are not discernible by CT.  MRI brain 02/11/2019: 1. Patchy small volume acute ischemic nonhemorrhagic infarct involving the left lateral midbrain/pons. 2. Underlying mild chronic microvascular ischemic disease.   Recent labs: 11/28/2019: Glucose 177, BUN/Cr 33/2.33. EGFR 28. Na/K 138/4.0. Rest of the CMP normal H/H 11/32. MCV 86. Platelets 254 Chol 167, TG 287, HDL 39, LDL 81  03/12/2019: Glucose 170.  BUN/creatinine 26/1.3.  EGFR 57.  NA/K1 39/4.2. H/H 12.6/37.5.  MCV 91.  Platelets 245.  Normal iron studies. Hemoglobin A1c 8.5%. Cholesterol  106, triglycerides 103, HDL 38, LDL 47   Review of Systems  Cardiovascular: Positive for dyspnea on exertion. Negative for chest pain, leg swelling, palpitations and syncope.  Genitourinary: Negative for dysuria.  Neurological: Positive for numbness.       Foot drop  All other systems reviewed and are negative.        Vitals:   04/29/20 1546  BP: (!) 161/82  Pulse: 80  Resp: 17  SpO2: 98%     Objective:   Physical Exam Vitals and nursing note reviewed.  Constitutional:      General: She is not in acute distress. HENT:     Head: Normocephalic and atraumatic.  Neck:     Vascular: No JVD.  Cardiovascular:     Rate and Rhythm: Normal rate and regular rhythm.     Pulses: Intact distal pulses.     Heart sounds: Murmur heard.  Early systolic murmur is present with a grade of 2/6 at the lower right sternal border.   Pulmonary:     Effort: Pulmonary effort is normal.     Breath sounds: Normal breath sounds. No rales.  Neurological:     Cranial Nerves: No cranial nerve deficit.           Assessment &   Recommendations:   48 y.o. 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  Hypertension: Uncontrolled.  Strong family history of hypertension. Currently on carvedilol 25 mg bid, amlodipine-benazepril 10-40 mg daily,Bidil from 2 tab tid, HCTZ 25 mg daily, could not tolerate clonidine 0.2 mg bid. Defer initiaiton of spironolactone to nephrology.   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 Less likely to be amyloidosis.  History of stroke: No Afib on even r monitor.  Continue Aspirin, statin.  Type 2 diabetes mellitus: Uncontrolled.  Management as per PCP.  I discussed with the patient regarding the risks of Covid infection, hospitalization, and death.  I strongly recommended getting  vaccinated against Covid as a safe and FDA approved measure to reduce risk of infection, hospitalization, as well as death. Patient conitues to be reluctant to get vaccinated.   F/u in 6 months    J , MD [facility] Pager: [phone] Office: [phone] If no answer Cell [phone]   

## 2020-04-29 ENCOUNTER — Encounter: Payer: Self-pay | Admitting: Cardiology

## 2020-04-29 ENCOUNTER — Ambulatory Visit (INDEPENDENT_AMBULATORY_CARE_PROVIDER_SITE_OTHER): Payer: Medicaid Other | Admitting: Cardiology

## 2020-04-29 ENCOUNTER — Other Ambulatory Visit: Payer: Self-pay

## 2020-04-29 VITALS — BP 161/82 | HR 80 | Resp 17 | Ht 69.0 in | Wt 237.0 lb

## 2020-04-29 DIAGNOSIS — I10 Essential (primary) hypertension: Secondary | ICD-10-CM

## 2020-04-29 DIAGNOSIS — Z7189 Other specified counseling: Secondary | ICD-10-CM | POA: Insufficient documentation

## 2020-04-29 DIAGNOSIS — E782 Mixed hyperlipidemia: Secondary | ICD-10-CM

## 2020-04-29 DIAGNOSIS — I4729 Other ventricular tachycardia: Secondary | ICD-10-CM

## 2020-04-29 DIAGNOSIS — I472 Ventricular tachycardia: Secondary | ICD-10-CM

## 2020-04-29 DIAGNOSIS — E1165 Type 2 diabetes mellitus with hyperglycemia: Secondary | ICD-10-CM

## 2020-04-29 DIAGNOSIS — Z8673 Personal history of transient ischemic attack (TIA), and cerebral infarction without residual deficits: Secondary | ICD-10-CM

## 2020-05-05 ENCOUNTER — Other Ambulatory Visit: Payer: Self-pay

## 2020-05-05 ENCOUNTER — Ambulatory Visit
Admission: RE | Admit: 2020-05-05 | Discharge: 2020-05-05 | Disposition: A | Discharge status: Home / Self Care | Payer: Medicaid Other | Source: Ambulatory Visit | Attending: Obstetrics | Admitting: Obstetrics

## 2020-05-05 DIAGNOSIS — Z1239 Encounter for other screening for malignant neoplasm of breast: Secondary | ICD-10-CM

## 2020-05-12 ENCOUNTER — Ambulatory Visit (INDEPENDENT_AMBULATORY_CARE_PROVIDER_SITE_OTHER): Payer: Medicaid Other

## 2020-05-12 ENCOUNTER — Ambulatory Visit (HOSPITAL_COMMUNITY)
Admission: EM | Admit: 2020-05-12 | Discharge: 2020-05-12 | Disposition: A | Discharge status: Home / Self Care | Payer: Medicaid Other | Attending: Family Medicine | Admitting: Family Medicine

## 2020-05-12 ENCOUNTER — Other Ambulatory Visit: Payer: Self-pay

## 2020-05-12 ENCOUNTER — Encounter (HOSPITAL_COMMUNITY): Payer: Self-pay

## 2020-05-12 DIAGNOSIS — M25571 Pain in right ankle and joints of right foot: Secondary | ICD-10-CM

## 2020-05-12 DIAGNOSIS — W19XXXA Unspecified fall, initial encounter: Secondary | ICD-10-CM

## 2020-05-12 DIAGNOSIS — S93491A Sprain of other ligament of right ankle, initial encounter: Secondary | ICD-10-CM

## 2020-05-12 DIAGNOSIS — I10 Essential (primary) hypertension: Secondary | ICD-10-CM

## 2020-05-12 MED ORDER — IBUPROFEN 800 MG PO TABS
800.0000 mg | ORAL_TABLET | Freq: Three times a day (TID) | ORAL | 0 refills | Status: DC
Start: 2020-05-12 — End: 2020-07-16

## 2020-05-12 NOTE — ED Triage Notes (Signed)
Pt presents with headache and right ankle pain after a fall at home after slipping on water.

## 2020-05-12 NOTE — Discharge Instructions (Addendum)
Your blood pressure was noted to be elevated during your visit today. If you are currently taking medication for high blood pressure, please ensure you are taking this as directed. If you do not have a history of high blood pressure and your blood pressure remains persistently elevated, you may need to begin taking a medication at some point. You may return here within the next few days to recheck if unable to see your primary care provider or if do not have a one.  BP (!) 190/81 (BP Location: Right Arm)   Pulse 78   Temp 97.9 F (36.6 C) (Oral)   Resp 18   SpO2 100%

## 2020-05-13 NOTE — ED Provider Notes (Signed)
[place]   [id number] 05/12/20 Arrival Time: 6967  ASSESSMENT & PLAN:  1. Acute right ankle pain   2. Elevated blood pressure reading in office with diagnosis of hypertension   3. Sprain of anterior talofibular ligament of right ankle, initial encounter     I have personally viewed the imaging studies ordered this visit. No fx appreciated.   Natural history and expected course discussed. Questions answered. Rest, ice, compression, elevation (RICE) therapy. Fit with ankle brace for use over next 1 week. NSAIDs per medication orders.  Meds ordered this encounter  Medications  . ibuprofen (ADVIL) 800 MG tablet    Sig: Take 1 tablet (800 mg total) by mouth 3 (three) times daily with meals.    Dispense:  21 tablet    Refill:  0    Orders Placed This Encounter  Procedures  . DG Ankle Complete Right  . Apply ASO lace-up ankle brace    Recommend:  Follow-up Information    [place].   Why: If worsening or failing to improve as anticipated. Contact information: [address] [place] [phone]                Discharge Instructions     Your blood pressure was noted to be elevated during your visit today. If you are currently taking medication for high blood pressure, please ensure you are taking this as directed. If you do not have a history of high blood pressure and your blood pressure remains persistently elevated, you may need to begin taking a medication at some point. You may return here within the next few days to recheck if unable to see your primary care provider or if do not have a one.  BP (!) 190/81 (BP Location: Right Arm)   Pulse 78   Temp 97.9 F (36.6 C) (Oral)   Resp 18   SpO2 100%       Reviewed expectations re: course of current medical issues. Questions answered. Outlined signs and symptoms indicating need for more acute intervention. Patient verbalized  understanding. After Visit Summary given.  SUBJECTIVE: History from: patient. [name] is a 49 y.o. female who reports R ankle pain after fall at home today. Able to bear wt but with discomfort. No extremity sensation changes or weakness. No home tx.  Increased blood pressure noted today. Reports that she is tx for HTN. Taking medications as directed.   Past Surgical History:  Procedure Laterality Date  . ACHILLES TENDON REPAIR    . TUBAL LIGATION        OBJECTIVE:  Vitals:   05/12/20 1914  BP: (!) 190/81  Pulse: 78  Resp: 18  Temp: 97.9 F (36.6 C)  TempSrc: Oral  SpO2: 100%    General appearance: alert; no distress HEENT: [place]; AT Neck: supple with FROM Resp: unlabored respirations Extremities: . RLE: warm with well perfused appearance; fairly well localized moderate tenderness over right lateral ankle, ATFL distribution; without gross deformities; swelling: minimal; bruising: none; ankle ROM: normal, with discomfort CV: brisk extremity capillary refill of RLE; 2+ DP pulse of RLE. Skin: warm and dry; no visible rashes Neurologic: normal sensation and strength of LLE Psychological: alert and cooperative; normal mood and affect  Imaging: DG Ankle Complete Right  Result Date: 05/12/2020 CLINICAL DATA:  Ankle pain after fall EXAM: RIGHT ANKLE - COMPLETE 3+ VIEW COMPARISON:  None. FINDINGS: No fracture or malalignment. Ankle mortise is symmetric. There is  soft tissue swelling IMPRESSION: No acute osseous abnormality. Electronically Signed   By: Donavan Foil M.D.   On: 05/12/2020 20:14      No Known Allergies  Past Medical History:  Diagnosis Date  . Arthritis   . Asthma   . Cataract    Mixed form OU  . CKD (chronic kidney disease)   . Coronary artery disease   . Diabetes mellitus   . Diabetic retinopathy ([place])    NPDR OU  . Hyperlipidemia   . Hypertension   . Hypertensive retinopathy    OU  . Left thyroid nodule    diagnosed 07/2018  . Pseudotumor  cerebri   . Stroke ([place])   . Vitamin D deficiency    Social History   Socioeconomic History  . Marital status: Single    Spouse name: Not on file  . Number of children: 4  . Years of education: Not on file  . Highest education level: Not on file  Occupational History  . Not on file  Tobacco Use  . Smoking status: Former Smoker    Packs/day: 0.50    Years: 30.00    Pack years: 15.00    Types: Cigarettes    Quit date: 2017    Years since quitting: 4.7  . Smokeless tobacco: Former Systems developer    Quit date: 03/31/2016  Vaping Use  . Vaping Use: Never used  Substance and Sexual Activity  . Alcohol use: Yes    Alcohol/week: 0.0 standard drinks    Comment: rare  . Drug use: Yes    Types: Marijuana    Comment: Daily  . Sexual activity: Not Currently    Partners: Male    Birth control/protection: Surgical  Other Topics Concern  . Not on file  Social History Narrative  . Not on file   Social Determinants of Health   Financial Resource Strain:   . Difficulty of Paying Living Expenses: Not on file  Food Insecurity:   . Worried About Charity fundraiser in the Last Year: Not on file  . Ran Out of Food in the Last Year: Not on file  Transportation Needs:   . Lack of Transportation (Medical): Not on file  . Lack of Transportation (Non-Medical): Not on file  Physical Activity:   . Days of Exercise per Week: Not on file  . Minutes of Exercise per Session: Not on file  Stress:   . Feeling of Stress : Not on file  Social Connections:   . Frequency of Communication with Friends and Family: Not on file  . Frequency of Social Gatherings with Friends and Family: Not on file  . Attends Religious Services: Not on file  . Active Member of Clubs or Organizations: Not on file  . Attends Archivist Meetings: Not on file  . Marital Status: Not on file   Family History  Problem Relation Age of Onset  . Asthma Mother   . Hypertension Father   . Kidney disease Father   . Asthma  Other   . Hyperlipidemia Other   . Hypertension Other   . Cancer Other   . Diabetes Maternal Grandmother   . Congestive Heart Failure Maternal Grandmother   . Hypertension Sister   . Asthma Sister   . Congestive Heart Failure Maternal Aunt    Past Surgical History:  Procedure Laterality Date  . ACHILLES TENDON REPAIR    . TUBAL LIGATION        Vanessa Kick, MD 05/13/20 [phone]

## 2020-05-28 ENCOUNTER — Ambulatory Visit: Payer: Medicaid Other | Admitting: Neurology

## 2020-05-28 ENCOUNTER — Other Ambulatory Visit: Payer: Self-pay

## 2020-05-28 ENCOUNTER — Encounter: Payer: Self-pay | Admitting: Neurology

## 2020-05-28 VITALS — BP 176/84 | HR 92 | Ht 69.0 in | Wt 231.0 lb

## 2020-05-28 DIAGNOSIS — I6990 Unspecified sequelae of unspecified cerebrovascular disease: Secondary | ICD-10-CM | POA: Diagnosis not present

## 2020-05-28 NOTE — Patient Instructions (Signed)
AlsoI had a long d/w patient about his recent stroke, risk for recurrent stroke/TIAs, personally independently reviewed imaging studies and stroke evaluation results and answered questions.Continue aspirin 81 mg daily  for secondary stroke prevention and maintain strict control of hypertension with blood pressure goal below 130/90, diabetes with hemoglobin A1c goal below 6.5% and lipids with LDL cholesterol goal below 70 mg/dL. I also advised the patient to eat a healthy diet with plenty of whole grains, cereals, fruits and vegetables, exercise regularly and maintain ideal body weight.  I encouraged her to continue participating in cognitively challenging activities like solving crossword puzzles, playing bridge and sodoku for her mild cognitive impairment which appears improved.  I advised her to do regular stress relaxation activities like meditation yoga and exercise to help with the tension headaches which are also improved followup in the future with me in 1 year or call earlier if necessary.

## 2020-05-28 NOTE — Progress Notes (Signed)
[facility] [address] [place]. [place] [address] [phone]       OFFICE FOLLOW-UP NOTE  [name] Date of Birth:  17-Aug-1970 Medical Record Number:  [id number]   HPI: Initial video visit 07/24/2019 : [name] is a 49 year old African-American lady seen today for initial virtual video consultation visit for stroke and memory loss.  History is obtained from the patient, review of electronic medical records and referral notes and I personally reviewed imaging films in PACS.  She developed sudden onset of facial numbness as well as right-sided weakness and gait difficulty on 02/12/2019 and was admitted to [facility].  She presented beyond time window for thrombolysis.  MRI scan of the brain showed a small midbrain and brainstem lacunar infarct.  CT angiogram of the brain and neck both did not reveal significant large vessel stenosis or occlusion.  Transthoracic echo showed normal ejection fraction.  Hemoglobin A1c was elevated at 8.8 and LDL cholesterol was 57 mg percent.  Patient is started on dual antiplatelet therapy and advised aggressive risk factor modification and treatment for sleep apnea with CPAP.  Patient improved gradually with physical occupational therapy was initially walking with a cane and now she can walk independently.  She still has some mild residual right facial numbness but right-sided strength has improved.  She walks independently indoors and short distances and uses cane only for long distances.  The patient has not had any recurrent stroke or TIA symptoms since then.  She states her blood pressure is well controlled and sugars are also doing better.  She however has not been able to tolerate her CPAP ever since Covid pandemic began and has not seen a medical doctor for troubleshooting this.  She however is concerned today about memory and cognitive difficulties that she has been having ever since the stroke.  She states that she has  trouble finding words and at times completing sentences.  She may remember the words later on.  She denies feeling depressed or anxious.  She denies headache or any other new neurological symptoms.  She is still independent and manages all her affairs.  There is strong family history of multiple family members with memory problems as well as strokes.  There is no history of significant head injury with loss of consciousness, seizures or drug abuse.  She has not had any recent work-up for her memory loss issues on her brain imaging study Update 09/16/2019 : She returns for follow-up after last video visit 2 months ago.  She continues to have short-term memory difficulties but feels its not as bad.  She has learned to strategize and write things down which seems to be helping.  At last visit she had complained of new memory issues & obtained lab work for reversible causes of memory loss and vitamin B12, TSH and RPR were negative on 07/31/2019.Marland Kitchen  EEG on 08/19/2019 was normal.  MRI scan of the brain on 08/21/2019 shows a faintly enhancing right corpus callosal and parasagittal frontal subacute infarct.  Patient had previously in July 2020 had extensive stroke work-up which was negative.  Patient states she has since started using CPAP regularly after she saw her primary care physician recently.  She has been having problems with constipation and has also had alternate watery diarrhea and she is seeing her primary care physician for the same.  She remains on aspirin for stroke prevention which she is tolerating well without bruising or bleeding.  She is tolerating Lipitor  well without muscle aches and pains.  Her blood pressure is usually better controlled to slightly elevated in office today.   Update 05/28/2020 : She returns for follow-up after last visit 8 months ago.  She has not had no further stroke or TIA symptoms.  She states her memory is also fine.  Headaches are much improved she did find benefit after doing  stress relaxation activities.  She does virtual reality game playing which helps her relax a lot.  She remains on aspirin which is tolerating well without bruising or bleeding.  Blood pressure is still running high and today it is elevated in office at 176/84.  She is working with the primary care physician to get it under better control.  She states her fasting sugars usually run in the 120s.  She remains on Crestor which is tolerating well without muscle aches and pains.  She states she is eating healthy and has lost about 15 pounds.  She has no new complaints today.  She had echocardiogram done on 10/16/2019 which showed normal ejection fraction.  Transcranial Doppler bubble study done on 09/25/2019 was negative for PFO.  She had 30-day heart monitor from 09/17/2019 to 10/16/2019 which showed no significant arrhythmias or atrial fibrillation.  CT angiogram of the brain and neck done on 10/08/2019 showed no large vessel stenosis or occlusion and only mild atherosclerotic changes.  No significant change compared with previous CTA from July 2020. ROS:   14 system review of systems is positive for  Headache, stress, memory impairment, disturbed sleep, weakness and all other systems negative PMH:  Past Medical History:  Diagnosis Date  . Arthritis   . Asthma   . Cataract    Mixed form OU  . CKD (chronic kidney disease)   . Coronary artery disease   . Diabetes mellitus   . Diabetic retinopathy ([place])    NPDR OU  . Hyperlipidemia   . Hypertension   . Hypertensive retinopathy    OU  . Left thyroid nodule    diagnosed 07/2018  . Pseudotumor cerebri   . Stroke ([place])   . Vitamin D deficiency     Social History:  Social History   Socioeconomic History  . Marital status: Single    Spouse name: Not on file  . Number of children: 4  . Years of education: Not on file  . Highest education level: Not on file  Occupational History  . Occupation: unemployed  Tobacco Use  . Smoking status: Former Smoker     Packs/day: 0.50    Years: 30.00    Pack years: 15.00    Types: Cigarettes    Quit date: 2017    Years since quitting: 4.8  . Smokeless tobacco: Former Systems developer    Quit date: 03/31/2016  Vaping Use  . Vaping Use: Never used  Substance and Sexual Activity  . Alcohol use: Yes    Alcohol/week: 0.0 standard drinks    Comment: rare  . Drug use: Yes    Types: Marijuana    Comment: Daily  . Sexual activity: Not Currently    Partners: Male    Birth control/protection: Surgical  Other Topics Concern  . Not on file  Social History Narrative   Lives with 3 kids   Right handed   Drinks 2 cups caffeine daily   Social Determinants of Health   Financial Resource Strain:   . Difficulty of Paying Living Expenses: Not on file  Food Insecurity:   . Worried About Running  Out of Food in the Last Year: Not on file  . Ran Out of Food in the Last Year: Not on file  Transportation Needs:   . Lack of Transportation (Medical): Not on file  . Lack of Transportation (Non-Medical): Not on file  Physical Activity:   . Days of Exercise per Week: Not on file  . Minutes of Exercise per Session: Not on file  Stress:   . Feeling of Stress : Not on file  Social Connections:   . Frequency of Communication with Friends and Family: Not on file  . Frequency of Social Gatherings with Friends and Family: Not on file  . Attends Religious Services: Not on file  . Active Member of Clubs or Organizations: Not on file  . Attends Archivist Meetings: Not on file  . Marital Status: Not on file  Intimate Partner Violence:   . Fear of Current or Ex-Partner: Not on file  . Emotionally Abused: Not on file  . Physically Abused: Not on file  . Sexually Abused: Not on file    Medications:   Current Outpatient Medications on File Prior to Visit  Medication Sig Dispense Refill  . Accu-Chek FastClix Lancets MISC 4 (four) times daily.    Marland Kitchen ACCU-CHEK GUIDE test strip See admin instructions. for testing    .  albuterol (VENTOLIN HFA) 108 (90 Base) MCG/ACT inhaler Inhale into the lungs every 6 (six) hours as needed for wheezing or shortness of breath.    Marland Kitchen amLODipine-benazepril (LOTREL) 10-40 MG capsule Take 1 capsule by mouth daily. 90 capsule 2  . aspirin EC 81 MG EC tablet Take 1 tablet (81 mg total) by mouth daily.    . blood glucose meter kit and supplies KIT Dispense based on patient and insurance preference. Use up to four times daily as directed. (FOR ICD-9 250.00, 250.01). 1 each 0  . carvedilol (COREG) 25 MG tablet Take 1 tablet (25 mg total) by mouth 2 (two) times daily with a meal. 180 tablet 2  . Cholecalciferol (VITAMIN D3) 25 MCG (1000 UT) CAPS Take 1 capsule by mouth daily.    . folic acid (FOLVITE) 1 MG tablet Take 1 mg by mouth daily.    . furosemide (LASIX) 20 MG tablet Take 40 mg by mouth 2 (two) times daily.     Marland Kitchen glipiZIDE (GLUCOTROL) 10 MG tablet Take 10 mg by mouth daily.    . hydrOXYzine (ATARAX/VISTARIL) 25 MG tablet Take 25 mg by mouth at bedtime.    . insulin aspart protamine- aspart (NOVOLOG MIX 70/30) (70-30) 100 UNIT/ML injection Inject 0.15 mLs (15 Units total) into the skin 2 (two) times daily with a meal. (Patient taking differently: Inject into the skin. 15 units in the am,17 in the evening) 10 mL 11  . Insulin Syringes, Disposable, S-010 1 ML MISC 1 application by Does not apply route 2 (two) times a day. 100 each 0  . isosorbide-hydrALAZINE (BIDIL) 20-37.5 MG tablet Take 2 tablets by mouth 3 (three) times daily. 180 tablet 3  . metFORMIN (GLUCOPHAGE) 1000 MG tablet Take 1,000 mg by mouth 2 (two) times daily.    . rosuvastatin (CRESTOR) 10 MG tablet TAKE 1 TABLET BY MOUTH EVERY DAY 90 tablet 3  . vitamin B-12 (CYANOCOBALAMIN) 1000 MCG tablet Take 1,000 mcg by mouth daily.    . cloNIDine (CATAPRES) 0.2 MG tablet Take 1 tablet (0.2 mg total) by mouth 2 (two) times daily. 60 tablet 2  . ibuprofen (ADVIL) 800 MG tablet  Take 1 tablet (800 mg total) by mouth 3 (three) times  daily with meals. 21 tablet 0   No current facility-administered medications on file prior to visit.    Allergies:  No Known Allergies  Physical Exam General: Obese middle-aged African-American lady seated, in no evident distress Head: head normocephalic and atraumatic.  Neck: supple with no carotid or supraclavicular bruits Cardiovascular: regular rate and rhythm, no murmurs Musculoskeletal: no deformity Skin:  no rash/petichiae Vascular:  Normal pulses all extremities Vitals:   05/28/20 1310  BP: (!) 176/84  Pulse: 92   Neurologic Exam Mental Status: Awake and fully alert. Oriented to place and time. Recent and remote memory intact. Attention span, concentration and fund of knowledge appropriate. Mood and affect appropriate.  Diminished recall 1/3.Marland Kitchen  Mini-Mental status exam not done  cranial Nerves: Fundoscopic exam not done. Pupils equal, briskly reactive to light. Extraocular movements full without nystagmus. Visual fields full to confrontation. Hearing intact. Facial sensation intact. Face, tongue, palate moves normally and symmetrically.  Motor: Normal bulk and tone. Normal strength in all tested extremity muscles. Sensory.: intact to touch ,pinprick .position and vibratory sensation.  Coordination: Rapid alternating movements normal in all extremities. Finger-to-nose and heel-to-shin performed accurately bilaterally. Gait and Station: Arises from chair without difficulty. Stance is normal. Gait demonstrates normal stride length and balance . Able to heel, toe and tandem walk with moderate difficulty.  Reflexes: 1+ and symmetric. Toes downgoing.      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  She is stable from neurovascular standpoint.  2.  Mild complaints of memory  difficulties likely due to mild cognitive impairment also stable.  3. Mild tension headaches which appear resolved as well   PLAN: AlsoI had a long d/w patient about his recent stroke, risk for recurrent stroke/TIAs, personally independently reviewed imaging studies and stroke evaluation results and answered questions.Continue aspirin 81 mg daily  for secondary stroke prevention and maintain strict control of hypertension with blood pressure goal below 130/90, diabetes with hemoglobin A1c goal below 6.5% and lipids with LDL cholesterol goal below 70 mg/dL. I also advised the patient to eat a healthy diet with plenty of whole grains, cereals, fruits and vegetables, exercise regularly and maintain ideal body weight.  I encouraged her to continue participating in cognitively challenging activities like solving crossword puzzles, playing bridge and sodoku for her mild cognitive impairment which appears improved.  I advised her to do regular stress relaxation activities like meditation yoga and exercise to help with the tension headaches which are also improved followup in the future with me in 1 year or call earlier if necessary.Greater than 50% of time during this 25 minute visit was spent on counseling,explanation of diagnosis of cryptogenic stroke, mild cognitive impairment, tension headache  and answering questions planning of further management, discussion with patient and family and coordination of care Antony Contras, MD  [facility] [address] [place], [place] [address]  Phone [phone] Fax [phone] Note: This document was prepared with digital dictation and possible smart phrase technology. Any transcriptional errors that result from this process are unintentional

## 2020-06-24 ENCOUNTER — Encounter (HOSPITAL_COMMUNITY): Payer: Self-pay | Admitting: Emergency Medicine

## 2020-06-24 ENCOUNTER — Other Ambulatory Visit: Payer: Self-pay

## 2020-06-24 ENCOUNTER — Observation Stay (HOSPITAL_COMMUNITY)
Admission: EM | Admit: 2020-06-24 | Discharge: 2020-06-25 | Disposition: A | Discharge status: Home / Self Care | Payer: Medicaid Other | Attending: Internal Medicine | Admitting: Internal Medicine

## 2020-06-24 ENCOUNTER — Emergency Department (HOSPITAL_COMMUNITY): Payer: Medicaid Other

## 2020-06-24 DIAGNOSIS — D649 Anemia, unspecified: Principal | ICD-10-CM | POA: Diagnosis present

## 2020-06-24 DIAGNOSIS — E119 Type 2 diabetes mellitus without complications: Secondary | ICD-10-CM | POA: Insufficient documentation

## 2020-06-24 DIAGNOSIS — Z20822 Contact with and (suspected) exposure to covid-19: Secondary | ICD-10-CM | POA: Diagnosis not present

## 2020-06-24 DIAGNOSIS — I130 Hypertensive heart and chronic kidney disease with heart failure and stage 1 through stage 4 chronic kidney disease, or unspecified chronic kidney disease: Secondary | ICD-10-CM | POA: Insufficient documentation

## 2020-06-24 DIAGNOSIS — Z7984 Long term (current) use of oral hypoglycemic drugs: Secondary | ICD-10-CM | POA: Insufficient documentation

## 2020-06-24 DIAGNOSIS — E669 Obesity, unspecified: Secondary | ICD-10-CM | POA: Diagnosis present

## 2020-06-24 DIAGNOSIS — Z87891 Personal history of nicotine dependence: Secondary | ICD-10-CM | POA: Diagnosis not present

## 2020-06-24 DIAGNOSIS — J45909 Unspecified asthma, uncomplicated: Secondary | ICD-10-CM | POA: Diagnosis not present

## 2020-06-24 DIAGNOSIS — I509 Heart failure, unspecified: Secondary | ICD-10-CM | POA: Diagnosis not present

## 2020-06-24 DIAGNOSIS — Z794 Long term (current) use of insulin: Secondary | ICD-10-CM | POA: Insufficient documentation

## 2020-06-24 DIAGNOSIS — I2510 Atherosclerotic heart disease of native coronary artery without angina pectoris: Secondary | ICD-10-CM | POA: Insufficient documentation

## 2020-06-24 DIAGNOSIS — E1169 Type 2 diabetes mellitus with other specified complication: Secondary | ICD-10-CM | POA: Diagnosis present

## 2020-06-24 DIAGNOSIS — I10 Essential (primary) hypertension: Secondary | ICD-10-CM | POA: Diagnosis present

## 2020-06-24 DIAGNOSIS — G4733 Obstructive sleep apnea (adult) (pediatric): Secondary | ICD-10-CM | POA: Diagnosis present

## 2020-06-24 DIAGNOSIS — I5033 Acute on chronic diastolic (congestive) heart failure: Secondary | ICD-10-CM

## 2020-06-24 DIAGNOSIS — Z79899 Other long term (current) drug therapy: Secondary | ICD-10-CM | POA: Insufficient documentation

## 2020-06-24 DIAGNOSIS — N189 Chronic kidney disease, unspecified: Secondary | ICD-10-CM | POA: Diagnosis not present

## 2020-06-24 DIAGNOSIS — I5032 Chronic diastolic (congestive) heart failure: Secondary | ICD-10-CM

## 2020-06-24 DIAGNOSIS — I160 Hypertensive urgency: Secondary | ICD-10-CM | POA: Diagnosis present

## 2020-06-24 DIAGNOSIS — I5030 Unspecified diastolic (congestive) heart failure: Secondary | ICD-10-CM

## 2020-06-24 DIAGNOSIS — E782 Mixed hyperlipidemia: Secondary | ICD-10-CM | POA: Diagnosis present

## 2020-06-24 LAB — FERRITIN: Ferritin: 7 ng/mL — ABNORMAL LOW (ref 11–307)

## 2020-06-24 LAB — COMPREHENSIVE METABOLIC PANEL
ALT: 28 U/L (ref 0–44)
AST: 21 U/L (ref 15–41)
Albumin: 3 g/dL — ABNORMAL LOW (ref 3.5–5.0)
Alkaline Phosphatase: 69 U/L (ref 38–126)
Anion gap: 12 (ref 5–15)
BUN: 22 mg/dL — ABNORMAL HIGH (ref 6–20)
CO2: 20 mmol/L — ABNORMAL LOW (ref 22–32)
Calcium: 8.7 mg/dL — ABNORMAL LOW (ref 8.9–10.3)
Chloride: 107 mmol/L (ref 98–111)
Creatinine, Ser: 1.6 mg/dL — ABNORMAL HIGH (ref 0.44–1.00)
GFR, Estimated: 40 mL/min — ABNORMAL LOW (ref >60)
Glucose, Bld: 216 mg/dL — ABNORMAL HIGH (ref 70–99)
Potassium: 3.7 mmol/L (ref 3.5–5.1)
Sodium: 139 mmol/L (ref 135–145)
Total Bilirubin: 0.4 mg/dL (ref 0.3–1.2)
Total Protein: 6.6 g/dL (ref 6.5–8.1)

## 2020-06-24 LAB — RESPIRATORY PANEL BY RT PCR (FLU A&B, COVID)
Influenza A by PCR: NEGATIVE
Influenza B by PCR: NEGATIVE
SARS Coronavirus 2 by RT PCR: NEGATIVE

## 2020-06-24 LAB — CBC
HCT: 25.7 % — ABNORMAL LOW (ref 36.0–46.0)
Hemoglobin: 7 g/dL — ABNORMAL LOW (ref 12.0–15.0)
MCH: 19.5 pg — ABNORMAL LOW (ref 26.0–34.0)
MCHC: 27.2 g/dL — ABNORMAL LOW (ref 30.0–36.0)
MCV: 71.6 fL — ABNORMAL LOW (ref 80.0–100.0)
Platelets: 253 10*3/uL (ref 150–400)
RBC: 3.59 MIL/uL — ABNORMAL LOW (ref 3.87–5.11)
RDW: 20 % — ABNORMAL HIGH (ref 11.5–15.5)
WBC: 10.9 10*3/uL — ABNORMAL HIGH (ref 4.0–10.5)
nRBC: 0 % (ref 0.0–0.2)

## 2020-06-24 LAB — IRON AND TIBC
Iron: 20 ug/dL — ABNORMAL LOW (ref 28–170)
Saturation Ratios: 4 % — ABNORMAL LOW (ref 10.4–31.8)
TIBC: 466 ug/dL — ABNORMAL HIGH (ref 250–450)
UIBC: 446 ug/dL

## 2020-06-24 LAB — ABO/RH: ABO/RH(D): AB POS

## 2020-06-24 LAB — TROPONIN I (HIGH SENSITIVITY): Troponin I (High Sensitivity): 17 ng/L (ref <18)

## 2020-06-24 LAB — PREPARE RBC (CROSSMATCH)

## 2020-06-24 LAB — BRAIN NATRIURETIC PEPTIDE: B Natriuretic Peptide: 427.9 pg/mL — ABNORMAL HIGH (ref 0.0–100.0)

## 2020-06-24 MED ORDER — ISOSORB DINITRATE-HYDRALAZINE 20-37.5 MG PO TABS
2.0000 | ORAL_TABLET | Freq: Once | ORAL | Status: AC
  Administered 2020-06-25: 2 via ORAL
  Filled 2020-06-24: qty 2

## 2020-06-24 MED ORDER — FUROSEMIDE 10 MG/ML IJ SOLN
40.0000 mg | Freq: Once | INTRAMUSCULAR | Status: AC
  Administered 2020-06-24: 40 mg via INTRAVENOUS
  Filled 2020-06-24: qty 4

## 2020-06-24 MED ORDER — SODIUM CHLORIDE 0.9% IV SOLUTION: Freq: Once | INTRAVENOUS | Status: AC

## 2020-06-24 NOTE — ED Triage Notes (Signed)
Patient told to come to ED for blood transfusion. Patient states she experienced vaginal bleeding for approximately 45 days, stopped a month ago. Had blood work today and was told Hgb was 6.2. Patient alert, oriented, no apparent distress, complains of instability while walking.

## 2020-06-24 NOTE — ED Notes (Signed)
Pt e-signed informed consent

## 2020-06-24 NOTE — ED Provider Notes (Addendum)
[facility] EMERGENCY DEPARTMENT Provider Note   CSN: [id number] Arrival date & time: 06/24/20  1611    History Chief Complaint  Patient presents with  . Anemia    [name] is a 49 y.o. female with PMH of HTN, T2DM, CVA, AUB, OSA on CPAP, T2DM, HLD and CHF who was sent from her nephrologist for an evaluation of anemia. Patient states she went to see her nephrologist at [facility] and they informed her that her hemoglobin was 6.2. Patient states she has not felt well since last Friday. She has had dizziness and feels off balance when walking. She has also had increased shortness of breath when she lays flat in bed, dyspnea on exertion and increased swelling in her legs over the last week. Patient endorse intermittent chest pain that is now resolved. She reports associated nausea, productive cough, palpitations, chills and generalized abdominal pain. She denies any headache, blurry vision, back pain, vaginal bleeding, hematuria, blood in stool, dyspnea or leg pain. States she has been taking her lasix as prescribed and has not had increased salt intake. Patient reports she has history of asthma and has had to use her albuterol and nebulizer more than often in the past week.   The history is provided by the patient and a relative. No language interpreter was used.      Past Medical History:  Diagnosis Date  . Arthritis   . Asthma   . Cataract    Mixed form OU  . CKD (chronic kidney disease)   . Coronary artery disease   . Diabetes mellitus   . Diabetic retinopathy ([place])    NPDR OU  . Hyperlipidemia   . Hypertension   . Hypertensive retinopathy    OU  . Left thyroid nodule    diagnosed 07/2018  . Pseudotumor cerebri   . Stroke ([place])   . Vitamin D deficiency     Patient Active Problem List   Diagnosis Date Noted  . Educated about COVID-19 virus infection 04/29/2020  . Class 2 severe obesity with serious comorbidity and body mass index (BMI) of  36.0 to 36.9 in adult [facility]) 03/30/2020  . NSVT (nonsustained ventricular tachycardia) ([place]) 11/13/2019  . Pericardial effusion 09/17/2019  . H/O: stroke 04/09/2019  . Left ventricular hypertrophy 04/09/2019  . Mixed hyperlipidemia 04/08/2019  . Uncontrolled type 2 diabetes mellitus with hyperglycemia ([place]) 04/08/2019  . Non compliance w medication regimen 02/20/2019  . Diabetes mellitus type 2 in obese ([place])   . Resistant hypertension   . 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 Cerebrovascular accident (CVA) ([facility])   . Hypokalemia   . Hypomagnesemia   . AKI (acute kidney injury) ([place])   . Acute CVA (cerebrovascular accident) ([place]) 02/11/2019  . Hypertensive urgency 02/11/2019  . ARF (acute renal failure) ([place]) 02/11/2019  . Type 2 diabetes mellitus with vascular disease ([place]) 02/11/2019  . Diabetes mellitus, type 2 ([place]) 04/12/2018  . Essential hypertension 01/08/2018  . Obstructive sleep apnea 08/26/2017    Past Surgical History:  Procedure Laterality Date  . ACHILLES TENDON REPAIR    . TUBAL LIGATION       OB History    Gravida  4   Para  4   Term  4   Preterm      AB      Living  4     SAB      TAB      Ectopic      Multiple  Live Births              Family History  Problem Relation Age of Onset  . Asthma Mother   . Hypertension Father   . Kidney disease Father   . Asthma Other   . Hyperlipidemia Other   . Hypertension Other   . Cancer Other   . Diabetes Maternal Grandmother   . Congestive Heart Failure Maternal Grandmother   . Hypertension Sister   . Asthma Sister   . Congestive Heart Failure Maternal Aunt     Social History   Tobacco Use  . Smoking status: Former Smoker    Packs/day: 0.50    Years: 30.00    Pack years: 15.00    Types: Cigarettes    Quit date: 2017    Years since quitting: 4.8  . Smokeless tobacco: Former Systems developer    Quit date: 03/31/2016  Vaping Use  . Vaping Use: Never used  Substance Use Topics  . Alcohol  use: Yes    Alcohol/week: 0.0 standard drinks    Comment: rare  . Drug use: Yes    Types: Marijuana    Comment: Daily    Home Medications Prior to Admission medications   Medication Sig Start Date End Date Taking? Authorizing Provider  Accu-Chek FastClix Lancets MISC 4 (four) times daily. 05/28/19  Yes [provider]  ACCU-CHEK GUIDE test strip See admin instructions. for testing 05/28/19  Yes [provider]  albuterol (VENTOLIN HFA) 108 (90 Base) MCG/ACT inhaler Inhale into the lungs every 6 (six) hours as needed for wheezing or shortness of breath.   Yes [provider]  amLODipine-benazepril (LOTREL) 10-40 MG capsule Take 1 capsule by mouth daily. 02/27/20  Yes Patwardhan, Reynold Bowen, MD  aspirin EC 81 MG EC tablet Take 1 tablet (81 mg total) by mouth daily. 02/14/19  Yes Mariel Aloe, MD  blood glucose meter kit and supplies KIT Dispense based on patient and insurance preference. Use up to four times daily as directed. (FOR ICD-9 250.00, 250.01). 04/12/18  Yes Martyn Ehrich, NP  carvedilol (COREG) 25 MG tablet Take 1 tablet (25 mg total) by mouth 2 (two) times daily with a meal. 08/08/19  Yes Patwardhan, Manish J, MD  Cholecalciferol (VITAMIN D3) 25 MCG (1000 UT) CAPS Take 1 capsule by mouth daily. 05/28/19  Yes [provider]  folic acid (FOLVITE) 1 MG tablet Take 1 mg by mouth daily.   Yes [provider]  furosemide (LASIX) 20 MG tablet Take 40 mg by mouth 2 (two) times daily.  03/19/19  Yes [provider]  glipiZIDE (GLUCOTROL) 10 MG tablet Take 10 mg by mouth daily. 03/19/19  Yes [provider]  insulin aspart protamine- aspart (NOVOLOG MIX 70/30) (70-30) 100 UNIT/ML injection Inject 0.15 mLs (15 Units total) into the skin 2 (two) times daily with a meal. Patient taking differently: Inject 15-17 Units into the skin See admin instructions. Inject 15 units subcutaneous in the morning, then inject 17 units subcutaneous in  the evening 02/20/19  Yes Love, Ivan Anchors, PA-C  Insulin Syringes, Disposable, V-785 1 ML MISC 1 application by Does not apply route 2 (two) times a day. 02/20/19  Yes Love, Ivan Anchors, PA-C  isosorbide-hydrALAZINE (BIDIL) 20-37.5 MG tablet Take 2 tablets by mouth 3 (three) times daily. 02/27/20  Yes Patwardhan, Manish J, MD  metFORMIN (GLUCOPHAGE) 1000 MG tablet Take 1,000 mg by mouth 2 (two) times daily. 03/19/19  Yes [provider]  rosuvastatin (CRESTOR)  10 MG tablet TAKE 1 TABLET BY MOUTH EVERY DAY Patient taking differently: Take 10 mg by mouth daily.  03/10/20  Yes Patwardhan, Manish J, MD  vitamin B-12 (CYANOCOBALAMIN) 1000 MCG tablet Take 1,000 mcg by mouth daily.   Yes [provider]  cloNIDine (CATAPRES) 0.2 MG tablet Take 1 tablet (0.2 mg total) by mouth 2 (two) times daily. Patient not taking: Reported on 06/24/2020 02/27/20   Nigel Mormon, MD  ibuprofen (ADVIL) 800 MG tablet Take 1 tablet (800 mg total) by mouth 3 (three) times daily with meals. Patient not taking: Reported on 06/24/2020 05/12/20   Vanessa Kick, MD    Allergies    Patient has no known allergies.  Review of Systems   Review of Systems  Constitutional: Positive for chills and fatigue. Negative for diaphoresis and fever.  HENT: Negative for congestion.   Eyes: Negative for visual disturbance.  Respiratory: Positive for cough, shortness of breath and wheezing. Negative for chest tightness.   Cardiovascular: Positive for palpitations and leg swelling. Negative for chest pain.  Gastrointestinal: Positive for abdominal pain and nausea. Negative for anal bleeding, blood in stool and vomiting.       Abd pain when she coughs  Genitourinary: Negative for dysuria, hematuria and vaginal bleeding.  Musculoskeletal: Negative for back pain.  Neurological: Positive for dizziness and light-headedness. Negative for tremors and headaches.  Psychiatric/Behavioral: Negative for confusion.    Physical  Exam Updated Vital Signs BP (!) 191/77   Pulse 80   Temp 98.1 F (36.7 C) (Oral)   Resp 17   Ht $R'5\' 9"'Wv$  (1.753 m)   Wt 110.2 kg   SpO2 97%   BMI 35.88 kg/m   Physical Exam Constitutional:      Appearance: She is obese. She is not diaphoretic.  HENT:     Head: Normocephalic and atraumatic.     Nose: Nose normal.     Mouth/Throat:     Mouth: Mucous membranes are moist.  Eyes:     Conjunctiva/sclera: Conjunctivae normal.  Cardiovascular:     Rate and Rhythm: Normal rate.     Heart sounds: Murmur heard.   Pulmonary:     Effort: Pulmonary effort is normal. No respiratory distress.     Breath sounds: Wheezing and rales present.  Abdominal:     General: Bowel sounds are normal. There is distension.     Palpations: Abdomen is soft.     Tenderness: There is abdominal tenderness.  Musculoskeletal:        General: Normal range of motion.     Cervical back: Normal range of motion and neck supple.  Skin:    General: Skin is warm.     Capillary Refill: Capillary refill takes less than 2 seconds.  Neurological:     General: No focal deficit present.     Mental Status: She is alert and oriented to person, place, and time.     Motor: No weakness.  Psychiatric:        Mood and Affect: Mood normal.     ED Results / Procedures / Treatments   Labs (all labs ordered are listed, but only abnormal results are displayed) Labs Reviewed  COMPREHENSIVE METABOLIC PANEL - Abnormal; Notable for the following components:      Result Value   CO2 20 (*)    Glucose, Bld 216 (*)    BUN 22 (*)    Creatinine, Ser 1.60 (*)    Calcium 8.7 (*)    Albumin 3.0 (*)  GFR, Estimated 40 (*)    All other components within normal limits  CBC - Abnormal; Notable for the following components:   WBC 10.9 (*)    RBC 3.59 (*)    Hemoglobin 7.0 (*)    HCT 25.7 (*)    MCV 71.6 (*)    MCH 19.5 (*)    MCHC 27.2 (*)    RDW 20.0 (*)    All other components within normal limits  TYPE AND SCREEN  ABO/RH     EKG EKG Interpretation  Date/Time:  Wednesday June 24 2020 22:01:30 EST Ventricular Rate:  79 PR Interval:    QRS Duration: 92 QT Interval:  420 QTC Calculation: 482 R Axis:   67 Text Interpretation: Sinus rhythm Probable left atrial enlargement Nonspecific T abnormalities, lateral leads Confirmed by Quintella Reichert [phone]) on 06/24/2020 10:26:16 PM   Radiology DG Chest 2 View  Result Date: 06/24/2020 CLINICAL DATA:  Chest pain EXAM: CHEST - 2 VIEW COMPARISON:  February 11, 2019 FINDINGS: The heart size and mediastinal contours are mildly enlarged. There is prominence of the central pulmonary vasculature. Patchy airspace opacities seen at the right lung base. The visualized skeletal structures are unremarkable. IMPRESSION: Mild cardiomegaly and pulmonary vascular congestion. Patchy airspace opacity at the right lung base which could be due to atelectasis and/or infectious etiology. Electronically Signed   By: Prudencio Pair M.D.   On: 06/24/2020 21:44    Procedures Procedures (including critical care time)  Medications Ordered in ED Medications  isosorbide-hydrALAZINE (BIDIL) 20-37.5 MG per tablet 2 tablet (has no administration in time range)  furosemide (LASIX) injection 40 mg (40 mg Intravenous Given 06/24/20 2313)  0.9 %  sodium chloride infusion (Manually program via Guardrails IV Fluids) ( Intravenous New Bag/Given 06/24/20 2319)    ED Course  I have reviewed the triage vital signs and the nursing notes.  Pertinent labs & imaging results that were available during my care of the patient were reviewed by me and considered in my medical decision making (see chart for details).    MDM Rules/Calculators/A&P                         Patient is a 49 yo female with history of HTN, T2DM, CVA, AUB, OSA on CPAP, T2DM, HLD and CHF here for an evaluation of symptomatic anemia. Found to be hypertensive on arrival. BMP showed mild AKI and CBC showed improved hgb of 7. Patient afebrile  with relatively normal white count. BNP elevated to the 400s but normal troponin. Iron studies shows iron deficiency anemia. EKG shows normal sinus rhythm with possible LAE. CXR shows mild cardiomegaly, pulmonary vascular congestion and airspace opacity at the R lung base likely due to atelectasis or an infection. Exam remarkable for bibasilar rales, wheezing and BLE 1+ pitting edema to the mild calf. Differentials include symptomatic anemia, acute exacerbation of heart failure, pneumonia or asthma exacerbation. Symptoms and findings more likely consistent with HF exacerbation. Giving 40 mg lasix IV and 1 unit of pRBCs. Will admit to triad hospitalist for further management of symptoms.   Final Clinical Impression(s) / ED Diagnoses Final diagnoses:  Symptomatic anemia  Acute on chronic diastolic congestive heart failure [facility])    Rx / DC Orders ED Discharge Orders    None       Lacinda Axon, MD 06/25/20 0044    Lacinda Axon, MD 06/25/20 6720    Quintella Reichert, MD 06/27/20 2053

## 2020-06-24 NOTE — Progress Notes (Signed)
[facility] Note  06/26/2020     CHIEF COMPLAINT Patient presents for Retina Follow Up   HISTORY OF PRESENT ILLNESS: [name] is a 49 y.o. female who presents to the clinic today for:   HPI    Retina Follow Up    Patient presents with  Diabetic Retinopathy.  This started months ago.  Severity is moderate.  Duration of months.  Since onset it is stable.  I, the attending physician,  performed the HPI with the patient and updated documentation appropriately.          Comments    Pt states vision is about the same OU.  Pt complains of constant floaters OD--denies floaters OS and denies flashes of light.  Pt denies eye pain or discomfort.       Last edited by Bernarda Caffey, MD on 06/26/2020  9:52 AM. (History)    pt was in the hospital until yesterday due to bleeding for 6 weeks straight, she states her drs did not catch it through any of her appts until Wednesday when she went to see her endocrinologist who sent her straight to the ED bc her hemoglobin was 6, pt had a blood transfusion in the ED with one unit of blood and was given iron pills, pt is now anemic and BP in the office today is 140/60, pt states she has a lot of floaters that are bothering her, pt states she has gotten a new glasses rx from Dr. Frederico Hamman, but he did not mention the swelling on her nerve  Referring physician: Gevena Cotton, MD [address] [place],  [place] [address]  HISTORICAL INFORMATION:   Selected notes from the MEDICAL RECORD NUMBER Referred by Dr. Gevena Cotton for concern of PDR OU   CURRENT MEDICATIONS: No current outpatient medications on file. (Ophthalmic Drugs)   No current facility-administered medications for this visit. (Ophthalmic Drugs)   Current Outpatient Medications (Other)  Medication Sig  . Accu-Chek FastClix Lancets MISC 4 (four) times daily.  Marland Kitchen ACCU-CHEK GUIDE test strip See admin instructions. for testing  . albuterol  (VENTOLIN HFA) 108 (90 Base) MCG/ACT inhaler Inhale into the lungs every 6 (six) hours as needed for wheezing or shortness of breath.  Marland Kitchen amLODipine-benazepril (LOTREL) 10-40 MG capsule Take 1 capsule by mouth daily.  Marland Kitchen aspirin EC 81 MG EC tablet Take 1 tablet (81 mg total) by mouth daily.  . blood glucose meter kit and supplies KIT Dispense based on patient and insurance preference. Use up to four times daily as directed. (FOR ICD-9 250.00, 250.01).  . carvedilol (COREG) 25 MG tablet Take 1 tablet (25 mg total) by mouth 2 (two) times daily with a meal.  . Cholecalciferol (VITAMIN D3) 25 MCG (1000 UT) CAPS Take 1 capsule by mouth daily.  . cloNIDine (CATAPRES) 0.2 MG tablet Take 1 tablet (0.2 mg total) by mouth 2 (two) times daily. (Patient not taking: Reported on 06/24/2020)  . ferrous sulfate (SLOW IRON) 160 (50 Fe) MG TBCR SR tablet Take 1 tablet (160 mg total) by mouth daily.  . folic acid (FOLVITE) 1 MG tablet Take 1 mg by mouth daily.  . furosemide (LASIX) 20 MG tablet Take 40 mg by mouth 2 (two) times daily.   Marland Kitchen glipiZIDE (GLUCOTROL) 10 MG tablet Take 10 mg by mouth daily.  Marland Kitchen ibuprofen (ADVIL) 800 MG tablet Take 1 tablet (800 mg total) by mouth 3 (three) times daily with meals. (Patient not  taking: Reported on 06/24/2020)  . insulin aspart protamine- aspart (NOVOLOG MIX 70/30) (70-30) 100 UNIT/ML injection Inject 0.15 mLs (15 Units total) into the skin 2 (two) times daily with a meal. (Patient taking differently: Inject 15-17 Units into the skin See admin instructions. Inject 15 units subcutaneous in the morning, then inject 17 units subcutaneous in the evening)  . Insulin Syringes, Disposable, N-817 1 ML MISC 1 application by Does not apply route 2 (two) times a day.  . ipratropium-albuterol (DUONEB) 0.5-2.5 (3) MG/3ML SOLN Take 3 mLs by nebulization every 4 (four) hours as needed.  . isosorbide-hydrALAZINE (BIDIL) 20-37.5 MG tablet Take 2 tablets by mouth 3 (three) times daily.  . metFORMIN  (GLUCOPHAGE) 1000 MG tablet Take 1,000 mg by mouth 2 (two) times daily.  . rosuvastatin (CRESTOR) 10 MG tablet TAKE 1 TABLET BY MOUTH EVERY DAY (Patient taking differently: Take 10 mg by mouth daily. )  . vitamin B-12 (CYANOCOBALAMIN) 1000 MCG tablet Take 1,000 mcg by mouth daily.   No current facility-administered medications for this visit. (Other)   REVIEW OF SYSTEMS: ROS    Positive for: Neurological, Genitourinary, Endocrine, Eyes, Respiratory   Negative for: Constitutional, Gastrointestinal, Skin, Musculoskeletal, HENT, Cardiovascular, Psychiatric, Allergic/Imm, Heme/Lymph   Last edited by Doneen Poisson on 06/26/2020  9:10 AM. (History)     ALLERGIES No Known Allergies  PAST MEDICAL HISTORY Past Medical History:  Diagnosis Date  . Arthritis   . Asthma   . Cataract    Mixed form OU  . CKD (chronic kidney disease)   . Coronary artery disease   . Diabetes mellitus   . Diabetic retinopathy ([place])    NPDR OU  . Hyperlipidemia   . Hypertension   . Hypertensive retinopathy    OU  . Left thyroid nodule    diagnosed 07/2018  . Pseudotumor cerebri   . Stroke ([place])   . Vitamin D deficiency    Past Surgical History:  Procedure Laterality Date  . ACHILLES TENDON REPAIR    . TUBAL LIGATION     FAMILY HISTORY Family History  Problem Relation Age of Onset  . Asthma Mother   . Hypertension Father   . Kidney disease Father   . Asthma Other   . Hyperlipidemia Other   . Hypertension Other   . Cancer Other   . Diabetes Maternal Grandmother   . Congestive Heart Failure Maternal Grandmother   . Hypertension Sister   . Asthma Sister   . Congestive Heart Failure Maternal Aunt    SOCIAL HISTORY Social History   Tobacco Use  . Smoking status: Former Smoker    Packs/day: 0.50    Years: 30.00    Pack years: 15.00    Types: Cigarettes    Quit date: 2017    Years since quitting: 4.8  . Smokeless tobacco: Former Systems developer    Quit date: 03/31/2016  Vaping Use  . Vaping Use:  Never used  Substance Use Topics  . Alcohol use: Yes    Alcohol/week: 0.0 standard drinks    Comment: rare  . Drug use: Yes    Types: Marijuana    Comment: Daily         OPHTHALMIC EXAM:  Base Eye Exam    Visual Acuity (Snellen - Linear)      Right Left   Dist cc 20/40 20/40 -2   Dist ph cc 20/25 20/30 -1   Correction: Glasses       Tonometry (Tonopen, 9:15 AM)  Right Left   Pressure 17 20       Pupils      Dark Light Shape React APD   Right 3 2 Round Brisk 0   Left 3 2 Round Brisk 0       Visual Fields      Left Right   Restrictions Partial outer superior nasal deficiency Partial outer superior nasal, inferior nasal deficiencies       Extraocular Movement      Right Left    Full Full       Neuro/Psych    Oriented x3: Yes   Mood/Affect: Normal       Dilation    Both eyes: 1.0% Mydriacyl, 2.5% Phenylephrine @ 9:15 AM        Slit Lamp and Fundus Exam    Slit Lamp Exam      Right Left   Lids/Lashes Mild Meibomian gland dysfunction Mild Meibomian gland dysfunction   Conjunctiva/Sclera Melanosis Melanosis   Cornea Focal NV and pigment at 0530 limbus, Debris in tear film Trace Punctate epithelial erosions, mild Debris in tear film   Anterior Chamber Deep and clear, narrow temporal angle Deep and clear, narrow temporal angle   Iris Round and moderately dilated to 79m, No NVI Round and moderately dilated to 679m No NVI   Lens 1-2+ Nuclear sclerosis, 1-2+ Cortical cataract 1-2+ Nuclear sclerosis, 1-2+ Cortical cataract   Vitreous Mild Vitreous syneresis Mild Vitreous syneresis       Fundus Exam      Right Left   Disc blurred margin, +edema/heme/CWS blurred margin, +edema (increased from prior), +/heme/CWS   C/D Ratio 0.2 0.3   Macula Flat, blunted foveal reflex, mild RPE mottling, CWS and flame heme nasal macula, scattered IRH Flat, blunted foveal reflex, +cystic changes, scattered MA, +flame hemes and CWS nasal macula - peripapillary   Vessels  attenuated, Tortuous, mild Copper wiring Vascular attenuation, Tortuous, mild AV crossing changes   Periphery Attached, scattered MA/IRH / DBH / CWS -- mostly posterior, early flat NV SN to disc, focal pigmented CR scar at 1030 Attached, scattered DBH        Refraction    Wearing Rx      Sphere Cylinder Axis   Right -1.00 +2.00 025   Left -1.25 +0.50 145          IMAGING AND PROCEDURES  Imaging and Procedures for _0 @  OCT, Retina - OU - Both Eyes       Right Eye Quality was good. Central Foveal Thickness: 218. Progression has worsened. Findings include normal foveal contour, no SRF, intraretinal hyper-reflective material, intraretinal fluid (Peristent non-central cystic changes, interval increase in disc edema).   Left Eye Quality was good. Central Foveal Thickness: 232. Progression has worsened. Findings include abnormal foveal contour, intraretinal fluid, intraretinal hyper-reflective material, no SRF, vitreomacular adhesion  (Persistent IRF temporal fovea, interval increase in disc edema).   Notes *Images captured and stored on drive  Diagnosis / Impression:  OD: Peristent non-central cystic changes in macula, interval increase in disc edema OS: Persistent IRF temporal fovea, interval increase in disc edema  Clinical management:  See below  Abbreviations: NFP - Normal foveal profile. CME - cystoid macular edema. PED - pigment epithelial detachment. IRF - intraretinal fluid. SRF - subretinal fluid. EZ - ellipsoid zone. ERM - epiretinal membrane. ORA - outer retinal atrophy. ORT - outer retinal tubulation. SRHM - subretinal hyper-reflective material  ASSESSMENT/PLAN:    ICD-10-CM   1. Severe nonproliferative diabetic retinopathy of both eyes with macular edema associated with type 2 diabetes mellitus ([place])  I62.7035   2. Retinal edema  H35.81 OCT, Retina - OU - Both Eyes  3. Essential hypertension  I10   4. Hypertensive retinopathy of both  eyes  H35.033   5. History of stroke  Z86.73   6. Pseudotumor cerebri  G93.2   7. Combined forms of age-related cataract of both eyes  H25.813     1,2. 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  - exam shows scattered IRH and CWS OU  - OCT OD: Peristent non-central cystic changes in macula, interval increase in disc edema OS: Persistent IRF temporal fovea, interval increase in disc edema  - FA (04.20.21) shows delayed filling time OS; no frank NV, but late leaking MA and scattered patches of capillary nonperfusion OU  - discussed findings and prognosis  - may need anti-VEGF for macular edema, but will hold off for now while BP is being worked on  - also pt was just discharged from hospital for anemia -- had 6 wk history of vaginal bleeding -- received RBC transfusion  - f/u 6-8 weeks, DFE, OCT  3-5. Hypertensive retinopathy OU with history of stroke  - stroke July 2020 -- ischemic non-hemorrhagic infarct involving L lateral midbrain/pons  - BP in office on 11.23.2020 was  209/112 (left arm)  - 01.20.21, bp 160/87  - BP this morning (taken in office) 140/60  - repeat MRI brain done 01.12.21, which showed new subacute infarction R frontal parasagittal and anterior coprus callosum  - pt reports recent changes in BP meds  - discussed importance of tight BP control  - monitor  6. History of pseudotumor cerebri  - exam and OCT show interval increase in optic disc edema OU  - will refer back to Dr. Frederico Hamman for further evaluation and management  7. 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  - discussed diagnosis and progression  - not yet visually significant  - monitor for now  Ophthalmic Meds Ordered this visit:  No orders of the defined types were placed in this encounter.     Return for f/u 6-8 weeks, NPDR OU, DFE, OCT.  There are no Patient Instructions on file for this  visit.   Explained the diagnoses, plan, and follow up with the patient and they expressed understanding.  Patient expressed understanding of the importance of proper follow up care.   This document serves as a record of services personally performed by Gardiner Sleeper, MD, PhD. It was created on their behalf by San Jetty. Owens Shark, OA an ophthalmic technician. The creation of this record is the provider's dictation and/or activities during the visit.    Electronically signed by: San Jetty. Owens Shark, [place] 11.17.2021 2:36 AM  Gardiner Sleeper, M.D., Ph.D. Diseases & Surgery of the Retina and Vitreous [facility]\  I have reviewed the above documentation for accuracy and completeness, and I agree with the above. Gardiner Sleeper, M.D., Ph.D. 06/27/20 2:36 AM  Abbreviations: M myopia (nearsighted); A astigmatism; H hyperopia (farsighted); P presbyopia; Mrx spectacle prescription;  CTL contact lenses; OD right eye; OS left eye; OU both eyes  XT exotropia; ET esotropia; PEK punctate epithelial keratitis; PEE punctate epithelial erosions; DES dry eye syndrome; MGD meibomian gland dysfunction; ATs artificial tears; PFAT's preservative  free artificial tears; [place] nuclear sclerotic cataract; PSC posterior subcapsular cataract; ERM epi-retinal membrane; PVD posterior vitreous detachment; RD retinal detachment; DM diabetes mellitus; DR diabetic retinopathy; NPDR non-proliferative diabetic retinopathy; PDR proliferative diabetic retinopathy; CSME clinically significant macular edema; DME diabetic macular edema; dbh dot blot hemorrhages; CWS cotton wool spot; POAG primary open angle glaucoma; C/D cup-to-disc ratio; HVF humphrey visual field; GVF goldmann visual field; OCT optical coherence tomography; IOP intraocular pressure; BRVO Branch retinal vein occlusion; CRVO central retinal vein occlusion; CRAO central retinal artery occlusion; BRAO branch retinal artery occlusion; RT retinal tear; SB scleral  buckle; PPV pars plana vitrectomy; VH Vitreous hemorrhage; PRP panretinal laser photocoagulation; IVK intravitreal kenalog; VMT vitreomacular traction; MH Macular hole;  NVD neovascularization of the disc; NVE neovascularization elsewhere; AREDS age related eye disease study; ARMD age related macular degeneration; POAG primary open angle glaucoma; EBMD epithelial/anterior basement membrane dystrophy; ACIOL anterior chamber intraocular lens; IOL intraocular lens; PCIOL posterior chamber intraocular lens; Phaco/IOL phacoemulsification with intraocular lens placement; [place] photorefractive keratectomy; LASIK laser assisted in situ keratomileusis; HTN hypertension; DM diabetes mellitus; COPD chronic obstructive pulmonary disease

## 2020-06-24 NOTE — ED Notes (Signed)
Pt transported to XRAY °

## 2020-06-25 ENCOUNTER — Observation Stay (HOSPITAL_BASED_OUTPATIENT_CLINIC_OR_DEPARTMENT_OTHER): Payer: Medicaid Other

## 2020-06-25 DIAGNOSIS — D649 Anemia, unspecified: Secondary | ICD-10-CM | POA: Diagnosis present

## 2020-06-25 DIAGNOSIS — I5032 Chronic diastolic (congestive) heart failure: Secondary | ICD-10-CM

## 2020-06-25 DIAGNOSIS — I5030 Unspecified diastolic (congestive) heart failure: Secondary | ICD-10-CM

## 2020-06-25 DIAGNOSIS — R0602 Shortness of breath: Secondary | ICD-10-CM | POA: Diagnosis not present

## 2020-06-25 DIAGNOSIS — I351 Nonrheumatic aortic (valve) insufficiency: Secondary | ICD-10-CM

## 2020-06-25 LAB — TYPE AND SCREEN
ABO/RH(D): AB POS
Antibody Screen: NEGATIVE
Unit division: 0

## 2020-06-25 LAB — BPAM RBC
Blood Product Expiration Date: 202112032359
ISSUE DATE / TIME: 202111172328
Unit Type and Rh: 8400

## 2020-06-25 LAB — CBC
HCT: 26.6 % — ABNORMAL LOW (ref 36.0–46.0)
Hemoglobin: 7.7 g/dL — ABNORMAL LOW (ref 12.0–15.0)
MCH: 21 pg — ABNORMAL LOW (ref 26.0–34.0)
MCHC: 28.9 g/dL — ABNORMAL LOW (ref 30.0–36.0)
MCV: 72.7 fL — ABNORMAL LOW (ref 80.0–100.0)
Platelets: 249 10*3/uL (ref 150–400)
RBC: 3.66 MIL/uL — ABNORMAL LOW (ref 3.87–5.11)
RDW: 21.9 % — ABNORMAL HIGH (ref 11.5–15.5)
WBC: 11.5 10*3/uL — ABNORMAL HIGH (ref 4.0–10.5)
nRBC: 0 % (ref 0.0–0.2)

## 2020-06-25 LAB — ECHOCARDIOGRAM COMPLETE
Area-P 1/2: 3.77 cm2
Calc EF: 59.8 %
Height: 69 in
S' Lateral: 3.6 cm
Single Plane A2C EF: 60.3 %
Single Plane A4C EF: 57.1 %
Weight: 3828.8 oz

## 2020-06-25 LAB — BASIC METABOLIC PANEL
Anion gap: 11 (ref 5–15)
BUN: 18 mg/dL (ref 6–20)
CO2: 21 mmol/L — ABNORMAL LOW (ref 22–32)
Calcium: 8.6 mg/dL — ABNORMAL LOW (ref 8.9–10.3)
Chloride: 107 mmol/L (ref 98–111)
Creatinine, Ser: 1.49 mg/dL — ABNORMAL HIGH (ref 0.44–1.00)
GFR, Estimated: 43 mL/min — ABNORMAL LOW (ref >60)
Glucose, Bld: 136 mg/dL — ABNORMAL HIGH (ref 70–99)
Potassium: 3.5 mmol/L (ref 3.5–5.1)
Sodium: 139 mmol/L (ref 135–145)

## 2020-06-25 LAB — TROPONIN I (HIGH SENSITIVITY): Troponin I (High Sensitivity): 18 ng/L — ABNORMAL HIGH (ref <18)

## 2020-06-25 LAB — GLUCOSE, CAPILLARY: Glucose-Capillary: 82 mg/dL (ref 70–99)

## 2020-06-25 MED ORDER — AMLODIPINE BESYLATE 10 MG PO TABS
10.0000 mg | ORAL_TABLET | Freq: Every day | ORAL | Status: DC
  Administered 2020-06-25: 10 mg via ORAL
  Filled 2020-06-25: qty 1

## 2020-06-25 MED ORDER — ROSUVASTATIN CALCIUM 5 MG PO TABS
10.0000 mg | ORAL_TABLET | Freq: Every day | ORAL | Status: DC
  Administered 2020-06-25: 10 mg via ORAL
  Filled 2020-06-25: qty 2

## 2020-06-25 MED ORDER — ALBUTEROL SULFATE HFA 108 (90 BASE) MCG/ACT IN AERS
1.0000 | INHALATION_SPRAY | Freq: Four times a day (QID) | RESPIRATORY_TRACT | Status: DC | PRN
  Filled 2020-06-25: qty 6.7

## 2020-06-25 MED ORDER — IPRATROPIUM-ALBUTEROL 0.5-2.5 (3) MG/3ML IN SOLN: 3.0000 mL | RESPIRATORY_TRACT | 0 refills | Status: DC | PRN

## 2020-06-25 MED ORDER — SODIUM CHLORIDE 0.9% FLUSH
3.0000 mL | Freq: Two times a day (BID) | INTRAVENOUS | Status: DC
  Administered 2020-06-25 (×2): 3 mL via INTRAVENOUS

## 2020-06-25 MED ORDER — ASPIRIN EC 81 MG PO TBEC
81.0000 mg | DELAYED_RELEASE_TABLET | Freq: Every day | ORAL | Status: DC
  Administered 2020-06-25: 81 mg via ORAL
  Filled 2020-06-25: qty 1

## 2020-06-25 MED ORDER — ACETAMINOPHEN 650 MG RE SUPP: 650.0000 mg | Freq: Four times a day (QID) | RECTAL | Status: DC | PRN

## 2020-06-25 MED ORDER — ACETAMINOPHEN 325 MG PO TABS: 650.0000 mg | ORAL_TABLET | Freq: Four times a day (QID) | ORAL | Status: DC | PRN

## 2020-06-25 MED ORDER — FUROSEMIDE 10 MG/ML IJ SOLN
40.0000 mg | Freq: Once | INTRAMUSCULAR | Status: AC
  Administered 2020-06-25: 40 mg via INTRAVENOUS
  Filled 2020-06-25: qty 4

## 2020-06-25 MED ORDER — INSULIN ASPART 100 UNIT/ML ~~LOC~~ SOLN
0.0000 [IU] | Freq: Three times a day (TID) | SUBCUTANEOUS | Status: DC
  Administered 2020-06-25: 5 [IU] via SUBCUTANEOUS

## 2020-06-25 MED ORDER — INSULIN ASPART PROT & ASPART (70-30 MIX) 100 UNIT/ML ~~LOC~~ SUSP
10.0000 [IU] | Freq: Two times a day (BID) | SUBCUTANEOUS | Status: DC
  Administered 2020-06-25: 10 [IU] via SUBCUTANEOUS
  Filled 2020-06-25: qty 10

## 2020-06-25 MED ORDER — ISOSORB DINITRATE-HYDRALAZINE 20-37.5 MG PO TABS
2.0000 | ORAL_TABLET | Freq: Three times a day (TID) | ORAL | Status: DC
  Administered 2020-06-25: 2 via ORAL
  Filled 2020-06-25: qty 2

## 2020-06-25 MED ORDER — INSULIN ASPART 100 UNIT/ML ~~LOC~~ SOLN: 0.0000 [IU] | Freq: Every day | SUBCUTANEOUS | Status: DC

## 2020-06-25 MED ORDER — CARVEDILOL 25 MG PO TABS
25.0000 mg | ORAL_TABLET | Freq: Two times a day (BID) | ORAL | Status: DC
  Administered 2020-06-25: 25 mg via ORAL
  Filled 2020-06-25: qty 1

## 2020-06-25 MED ORDER — POLYETHYLENE GLYCOL 3350 17 G PO PACK: 17.0000 g | PACK | Freq: Every day | ORAL | Status: DC | PRN

## 2020-06-25 MED ORDER — BENAZEPRIL HCL 5 MG PO TABS
40.0000 mg | ORAL_TABLET | Freq: Every day | ORAL | Status: DC
  Administered 2020-06-25: 40 mg via ORAL
  Filled 2020-06-25: qty 8

## 2020-06-25 MED ORDER — AMLODIPINE BESY-BENAZEPRIL HCL 10-40 MG PO CAPS: 1.0000 | ORAL_CAPSULE | Freq: Every day | ORAL | Status: DC

## 2020-06-25 MED ORDER — SLOW IRON 160 (50 FE) MG PO TBCR: 160.0000 mg | EXTENDED_RELEASE_TABLET | Freq: Every day | ORAL | 3 refills | Status: DC

## 2020-06-25 MED ORDER — ENOXAPARIN SODIUM 40 MG/0.4ML ~~LOC~~ SOLN
40.0000 mg | SUBCUTANEOUS | Status: DC
  Administered 2020-06-25: 40 mg via SUBCUTANEOUS
  Filled 2020-06-25: qty 0.4

## 2020-06-25 NOTE — H&P (Addendum)
History and Physical   [name]:[id number] DOB: July 26, 1971 DOA: 06/24/2020  PCP: [name], MD   Patient coming from: Home  Chief Complaint: Abnormal labs, shortness of breath  HPI: [name] is a 49 y.o. female with medical history significant of CVA, obesity, diabetes, hypertension, hyperlipidemia, OSA, heart failure who presents with shortness of breath and recent abnormal hemoglobin at nephrologist office.  Patient states that she was sent to ED for a blood transfusion after she was found to have a hemoglobin of 6.2 at her nephrologist office visit.  She also reports that for the past week or so she has felt unwell she noticed mild edema in her lower extremities for the past week, she is also noted to be short of breath on exertion and reports orthopnea.  She also reports some mild chest pain with exertion recently, no pain currently.  She denies fever, abdominal pain, diarrhea.  ED Course: Vital signs in ED significant for SBP in the 170s to 190s.  Vital signs otherwise stable.  Lab work showed creatinine of 1.6, baseline 1.4.  Glucose 200.  Hemoglobin 7.  WBC 11.  BNP 427.  Troponin negative x2.  Respiratory panel for flu and Covid negative.  Iron labs showed iron deficiency anemia.  Chest x-ray showed mild cardiomegaly and vascular congestion with a patchy opacity at the right base due to atelectasis versus infection.  Review of Systems: As per HPI otherwise all other systems reviewed and are negative.  Past Medical History:  Diagnosis Date  . Arthritis   . Asthma   . Cataract    Mixed form OU  . CKD (chronic kidney disease)   . Coronary artery disease   . Diabetes mellitus   . Diabetic retinopathy ([place])    NPDR OU  . Hyperlipidemia   . Hypertension   . Hypertensive retinopathy    OU  . Left thyroid nodule    diagnosed 07/2018  . Pseudotumor cerebri   . Stroke ([place])   . Vitamin D deficiency     Past Surgical History:  Procedure Laterality Date   . ACHILLES TENDON REPAIR    . TUBAL LIGATION      Social History  reports that she quit smoking about 4 years ago. Her smoking use included cigarettes. She has a 15.00 pack-year smoking history. She quit smokeless tobacco use about 4 years ago. She reports current alcohol use. She reports current drug use. Drug: Marijuana.  No Known Allergies  Family History  Problem Relation Age of Onset  . Asthma Mother   . Hypertension Father   . Kidney disease Father   . Asthma Other   . Hyperlipidemia Other   . Hypertension Other   . Cancer Other   . Diabetes Maternal Grandmother   . Congestive Heart Failure Maternal Grandmother   . Hypertension Sister   . Asthma Sister   . Congestive Heart Failure Maternal Aunt   Reviewed on admission  Prior to Admission medications   Medication Sig Start Date End Date Taking? Authorizing Provider  Accu-Chek FastClix Lancets MISC 4 (four) times daily. 05/28/19  Yes [provider]  ACCU-CHEK GUIDE test strip See admin instructions. for testing 05/28/19  Yes [provider]  albuterol (VENTOLIN HFA) 108 (90 Base) MCG/ACT inhaler Inhale into the lungs every 6 (six) hours as needed for wheezing or shortness of breath.   Yes [provider]  amLODipine-benazepril (LOTREL) 10-40 MG capsule Take 1 capsule by mouth daily. 02/27/20  Yes Patwardhan,  Manish J, MD  aspirin EC 81 MG EC tablet Take 1 tablet (81 mg total) by mouth daily. 02/14/19  Yes Mariel Aloe, MD  blood glucose meter kit and supplies KIT Dispense based on patient and insurance preference. Use up to four times daily as directed. (FOR ICD-9 250.00, 250.01). 04/12/18  Yes Martyn Ehrich, NP  carvedilol (COREG) 25 MG tablet Take 1 tablet (25 mg total) by mouth 2 (two) times daily with a meal. 08/08/19  Yes Patwardhan, Manish J, MD  Cholecalciferol (VITAMIN D3) 25 MCG (1000 UT) CAPS Take 1 capsule by mouth daily. 05/28/19  Yes [provider]  folic acid (FOLVITE) 1  MG tablet Take 1 mg by mouth daily.   Yes [provider]  furosemide (LASIX) 20 MG tablet Take 40 mg by mouth 2 (two) times daily.  03/19/19  Yes [provider]  glipiZIDE (GLUCOTROL) 10 MG tablet Take 10 mg by mouth daily. 03/19/19  Yes [provider]  insulin aspart protamine- aspart (NOVOLOG MIX 70/30) (70-30) 100 UNIT/ML injection Inject 0.15 mLs (15 Units total) into the skin 2 (two) times daily with a meal. Patient taking differently: Inject 15-17 Units into the skin See admin instructions. Inject 15 units subcutaneous in the morning, then inject 17 units subcutaneous in the evening 02/20/19  Yes Love, Ivan Anchors, PA-C  Insulin Syringes, Disposable, B-762 1 ML MISC 1 application by Does not apply route 2 (two) times a day. 02/20/19  Yes Love, Ivan Anchors, PA-C  isosorbide-hydrALAZINE (BIDIL) 20-37.5 MG tablet Take 2 tablets by mouth 3 (three) times daily. 02/27/20  Yes Patwardhan, Manish J, MD  metFORMIN (GLUCOPHAGE) 1000 MG tablet Take 1,000 mg by mouth 2 (two) times daily. 03/19/19  Yes [provider]  rosuvastatin (CRESTOR) 10 MG tablet TAKE 1 TABLET BY MOUTH EVERY DAY Patient taking differently: Take 10 mg by mouth daily.  03/10/20  Yes Patwardhan, Manish J, MD  vitamin B-12 (CYANOCOBALAMIN) 1000 MCG tablet Take 1,000 mcg by mouth daily.   Yes [provider]  cloNIDine (CATAPRES) 0.2 MG tablet Take 1 tablet (0.2 mg total) by mouth 2 (two) times daily. Patient not taking: Reported on 06/24/2020 02/27/20   Nigel Mormon, MD  ibuprofen (ADVIL) 800 MG tablet Take 1 tablet (800 mg total) by mouth 3 (three) times daily with meals. Patient not taking: Reported on 06/24/2020 05/12/20   Vanessa Kick, MD    Physical Exam: Vitals:   06/24/20 2330 06/24/20 2357 06/25/20 0015 06/25/20 0158  BP: (!) 194/80 (!) 189/76 (!) 182/90 (!) 186/80  Pulse: 78 85 79 80  Resp: 20 16 (!) 21 20  Temp: 98.8 F (37.1 C) 98.8 F (37.1 C)    TempSrc: Oral Oral    SpO2:  97% 95% 96% 96%  Weight:      Height:       Physical Exam Constitutional:      General: She is not in acute distress.    Appearance: Normal appearance.  HENT:     Head: Normocephalic and atraumatic.     Mouth/Throat:     Mouth: Mucous membranes are moist.     Pharynx: Oropharynx is clear.  Eyes:     Extraocular Movements: Extraocular movements intact.     Pupils: Pupils are equal, round, and reactive to light.  Cardiovascular:     Rate and Rhythm: Normal rate and regular rhythm.     Pulses: Normal pulses.     Heart sounds: Normal heart sounds.  Comments: Trace bilateral pitting edema to mid shin Pulmonary:     Effort: Pulmonary effort is normal. No respiratory distress.     Breath sounds: Rales (LLL, Trace) present.  Abdominal:     General: Bowel sounds are normal. There is no distension.     Palpations: Abdomen is soft.     Tenderness: There is no abdominal tenderness.  Musculoskeletal:        General: No swelling or deformity.     Right lower leg: Edema present.     Left lower leg: Edema present.  Skin:    General: Skin is warm and dry.  Neurological:     General: No focal deficit present.     Mental Status: Mental status is at baseline.    Labs on Admission: I have personally reviewed following labs and imaging studies  CBC: Recent Labs  Lab 06/24/20 1651  WBC 10.9*  HGB 7.0*  HCT 25.7*  MCV 71.6*  PLT 782    Basic Metabolic Panel: Recent Labs  Lab 06/24/20 1651  NA 139  K 3.7  CL 107  CO2 20*  GLUCOSE 216*  BUN 22*  CREATININE 1.60*  CALCIUM 8.7*    GFR: Estimated Creatinine Clearance: 57.5 mL/min (A) (by C-G formula based on SCr of 1.6 mg/dL (H)).  Liver Function Tests: Recent Labs  Lab 06/24/20 1651  AST 21  ALT 28  ALKPHOS 69  BILITOT 0.4  PROT 6.6  ALBUMIN 3.0*    Urine analysis:    Component Value Date/Time   COLORURINE YELLOW 02/11/2019 1740   APPEARANCEUR HAZY (A) 02/11/2019 1740   LABSPEC 1.011 02/11/2019 1740    PHURINE 6.0 02/11/2019 1740   GLUCOSEU >=500 (A) 02/11/2019 1740   HGBUR SMALL (A) 02/11/2019 1740   BILIRUBINUR NEGATIVE 02/11/2019 1740   KETONESUR NEGATIVE 02/11/2019 1740   PROTEINUR 100 (A) 02/11/2019 1740   UROBILINOGEN 0.2 08/25/2011 2043   NITRITE NEGATIVE 02/11/2019 1740   LEUKOCYTESUR NEGATIVE 02/11/2019 1740    Radiological Exams on Admission: DG Chest 2 View  Result Date: 06/24/2020 CLINICAL DATA:  Chest pain EXAM: CHEST - 2 VIEW COMPARISON:  February 11, 2019 FINDINGS: The heart size and mediastinal contours are mildly enlarged. There is prominence of the central pulmonary vasculature. Patchy airspace opacities seen at the right lung base. The visualized skeletal structures are unremarkable. IMPRESSION: Mild cardiomegaly and pulmonary vascular congestion. Patchy airspace opacity at the right lung base which could be due to atelectasis and/or infectious etiology. Electronically Signed   By: Prudencio Pair M.D.   On: 06/24/2020 21:44   EKG: Independently reviewed. sinus rhythm  Assessment/Plan Principal Problem:   Symptomatic anemia Active Problems:   Obstructive sleep apnea   Essential hypertension   Hypertensive urgency   Diabetes mellitus type 2 in obese (HCC)   Mixed hyperlipidemia   (HFpEF) heart failure with preserved ejection fraction (HCC)  Symptomatic anemia > Hemoglobin at nephrologist office is noticed to be 6.2, 7 in ED. > This is in the setting of about a month and a half of vaginal bleeding in August, with lab work consistent with iron deficiency anemia > Patient does follow with OB/GYN and they are aware of her episode of bleeding > 1 unit PRBCs ordered in ED - Follow-up for transfusion hemoglobin, goal greater than 8 with history of heart failure - Consider IV iron transfusion - AM CBC  Mild volume overload HFpEF (last echo 10/16/2019 with EF 50-55%, G2DD) > BNP elevated to 467 on admission, Cr 1.6 (  baseline 1.4) > Patient reports symptoms of mild edema x1  week, cough and orthopnea as well as DOE.  This is in the setting of iron deficiency anemia. > Patient received a dose of IV Lasix 40 mg prior to blood administration - We will order additional dose of IV Lasix tomorrow morning -Continue home benazepril, carvedilol, BiDil - Monitor I/Os and daily weights - Monitor for any signs of TACO - Recheck Echocardiogram - AM BMP  Diabetes > On oral medications and 70/30 insulin twice daily (15 to 17 units) - 10 units 70/30 twice daily - SSI  HTN > History of uncontrolled hypertension, BP as high as 494 systolic in ED, mild volume overload, receiving RBC transfusion - Continue home amlodipine, benazepril, carvedilol, BiDil - Lasix as above  HLD - Continue statin  OSA - CPAP  DVT prophylaxis: Lovenox Code Status:   Full  Family Communication:  None on admission  Disposition Plan:   Patient is from:  Home  Anticipated DC to:  Home  Anticipated DC date:  06/25/2020  Anticipated DC barriers: None  Consults called:  None Admission status:  Observation, telemetry Severity of Illness: The appropriate patient status for this patient is OBSERVATION. Observation status is judged to be reasonable and necessary in order to provide the required intensity of service to ensure the patient's safety. The patient's presenting symptoms, physical exam findings, and initial radiographic and laboratory data in the context of their medical condition is felt to place them at decreased risk for further clinical deterioration. Furthermore, it is anticipated that the patient will be medically stable for discharge from the hospital within 2 midnights of admission. The following factors support the patient status of observation.   " The patient's presenting symptoms include DOE, mild edema, orthopnea. " The physical exam findings include left lower lobe rales. " The initial radiographic and laboratory data are hemoglobin 7, BNP 427.   Marcelyn Bruins MD [facility]  How to contact the [facility] Attending or Consulting provider [place] or covering provider during after hours [place], for this patient?   1. Check the care team in [facility] and look for a) attending/consulting [facility] provider listed and b) the [facility] team listed 2. Log into [email or web address] and use [place] universal password to access. If you do not have the password, please contact the hospital operator. 3. Locate the [facility] provider you are looking for under [facility] and page to a number that you can be directly reached. 4. If you still have difficulty reaching the provider, please page the [facility] (Director on Call) for the Hospitalists listed on amion for assistance.  06/25/2020, 2:12 AM

## 2020-06-25 NOTE — Progress Notes (Signed)
ReDS Clip Diuretic Study Pt study # [id number]  Your patient is in the Blinded arm of the ReDS Clip Diuretic study.  Your patient has had a ReDS reading and the reading has been transmitted to the cloud.   Thank You   The research team   Kerby Nora, PharmD, BCPS Heart Failure Stewardship Pharmacist Phone [phone]  Please check [email or web address] for unit-specific pharmacist phone numbers

## 2020-06-25 NOTE — Progress Notes (Signed)
Discharge instructions provided to patient. All medications, follow up appointments, and discharge instructions provided. IV out. Monitor off CCMD notified. Discharging home.  Era Bumpers, RN

## 2020-06-25 NOTE — Progress Notes (Signed)
  Echocardiogram 2D Echocardiogram has been performed.  [name] 06/25/2020, 9:09 AM

## 2020-06-25 NOTE — Discharge Summary (Signed)
Physician Discharge Summary  [name] JHE:[id number] DOB: 17-Aug-1970 DOA: 06/24/2020  PCP: [name], MD  Admit date: 06/24/2020 Discharge date: 06/25/2020  Admitted From: Home Disposition: Home  Recommendations for Outpatient Follow-up:  1. Follow up with PCP in 1-2 weeks 2. Please obtain BMP/CBC in 1 to 2 weeks 3. Follow-up with OB/GYN as scheduled  Discharge Condition: Stable CODE STATUS: Full  Diet recommendation: Regular diet  Brief/Interim Summary: [name] is a 49 y.o. female with medical history significant of CVA, obesity, diabetes, hypertension, hyperlipidemia, OSA, heart failure who presents with shortness of breath and recent abnormal hemoglobin at nephrologist office.  Patient states that she was sent to ED for a blood transfusion after she was found to have a hemoglobin of 6.2 at her nephrologist office visit.  She also reports that for the past week or so she has felt unwell she noticed mild edema in her lower extremities for the past week, she is also noted to be short of breath on exertion and reports orthopnea.  She also reports some mild chest pain with exertion recently, no pain currently.  She denies fever, abdominal pain, diarrhea. Vital signs in ED significant for SBP in the 170s to 190s.  Vital signs otherwise stable.  Lab work showed creatinine of 1.6, baseline 1.4.  Glucose 200.  Hemoglobin 7.  WBC 11.  BNP 427.  Troponin negative x2.  Respiratory panel for flu and Covid negative.  Iron labs showed iron deficiency anemia.  Chest x-ray showed mild cardiomegaly and vascular congestion with a patchy opacity at the right base due to atelectasis versus infection.  Patient admitted as above with acute symptomatic anemia likely acute blood loss anemia with concurrent iron deficiency anemia.  Patient received 1 unit PRBC in the ED overnight during admission, this morning patient's repeat labs show hemoglobin of 7.7, patient now essentially feels back to  baseline.  We had lengthy discussion about chronic bleeding and need for iron replacement given her severe iron deficiency noted on intake labs.  She indicates she had previously been following with PCP in gynecology for prolonged menses but had difficulty following up due to transportation issues.  At this point given improvement in patient's hemoglobin and symptoms patient otherwise agreeable and stable for discharge home with close outpatient follow-up.  Given no fevers, hypoxia, respiratory symptoms, cough or sputum production will hold off on treatment of chest x-ray showing questionable right basilar atelectasis.  We discussed with patient if she were to have worsening respiratory symptoms or fever certainly would be reasonable to follow-up with PCP for further discussion.  Discharge Diagnoses:  Principal Problem:   Symptomatic anemia Active Problems:   Obstructive sleep apnea   Essential hypertension   Hypertensive urgency   Diabetes mellitus type 2 in obese (HCC)   Mixed hyperlipidemia   (HFpEF) heart failure with preserved ejection fraction [facility])    Discharge Instructions  Discharge Instructions    Call MD for:  persistant dizziness or light-headedness   Complete by: As directed    Diet - low sodium heart healthy   Complete by: As directed    Increase activity slowly   Complete by: As directed      Allergies as of 06/25/2020   No Known Allergies     Medication List    TAKE these medications   Accu-Chek FastClix Lancets Misc 4 (four) times daily.   Accu-Chek Guide test strip Generic drug: glucose blood See admin instructions. for testing   albuterol 108 (90  Base) MCG/ACT inhaler Commonly known as: VENTOLIN HFA Inhale into the lungs every 6 (six) hours as needed for wheezing or shortness of breath.   amLODipine-benazepril 10-40 MG capsule Commonly known as: LOTREL Take 1 capsule by mouth daily.   aspirin 81 MG EC tablet Take 1 tablet (81 mg total) by mouth  daily.   BiDil 20-37.5 MG tablet Generic drug: isosorbide-hydrALAZINE Take 2 tablets by mouth 3 (three) times daily.   blood glucose meter kit and supplies Kit Dispense based on patient and insurance preference. Use up to four times daily as directed. (FOR ICD-9 250.00, 250.01).   carvedilol 25 MG tablet Commonly known as: COREG Take 1 tablet (25 mg total) by mouth 2 (two) times daily with a meal.   cloNIDine 0.2 MG tablet Commonly known as: Catapres Take 1 tablet (0.2 mg total) by mouth 2 (two) times daily.   folic acid 1 MG tablet Commonly known as: FOLVITE Take 1 mg by mouth daily.   furosemide 20 MG tablet Commonly known as: LASIX Take 40 mg by mouth 2 (two) times daily.   glipiZIDE 10 MG tablet Commonly known as: GLUCOTROL Take 10 mg by mouth daily.   ibuprofen 800 MG tablet Commonly known as: ADVIL Take 1 tablet (800 mg total) by mouth 3 (three) times daily with meals.   insulin aspart protamine- aspart (70-30) 100 UNIT/ML injection Commonly known as: NOVOLOG MIX 70/30 Inject 0.15 mLs (15 Units total) into the skin 2 (two) times daily with a meal. What changed:   how much to take  when to take this  additional instructions   Insulin Syringes (Disposable) F-638 1 ML Misc 1 application by Does not apply route 2 (two) times a day.   metFORMIN 1000 MG tablet Commonly known as: GLUCOPHAGE Take 1,000 mg by mouth 2 (two) times daily.   rosuvastatin 10 MG tablet Commonly known as: CRESTOR TAKE 1 TABLET BY MOUTH EVERY DAY   Slow Iron 160 (50 Fe) MG Tbcr SR tablet Generic drug: ferrous sulfate Take 1 tablet (160 mg total) by mouth daily.   vitamin B-12 1000 MCG tablet Commonly known as: CYANOCOBALAMIN Take 1,000 mcg by mouth daily.   Vitamin D3 25 MCG (1000 UT) Caps Take 1 capsule by mouth daily.       No Known Allergies  Consultations:  None   Procedures/Studies: DG Chest 2 View  Result Date: 06/24/2020 CLINICAL DATA:  Chest pain EXAM:  CHEST - 2 VIEW COMPARISON:  February 11, 2019 FINDINGS: The heart size and mediastinal contours are mildly enlarged. There is prominence of the central pulmonary vasculature. Patchy airspace opacities seen at the right lung base. The visualized skeletal structures are unremarkable. IMPRESSION: Mild cardiomegaly and pulmonary vascular congestion. Patchy airspace opacity at the right lung base which could be due to atelectasis and/or infectious etiology. Electronically Signed   By: Prudencio Pair M.D.   On: 06/24/2020 21:44   ECHOCARDIOGRAM COMPLETE  Result Date: 06/25/2020    ECHOCARDIOGRAM REPORT   Patient Name:   [name] Date of Exam: 06/25/2020 Medical Rec #:  [id number]       Height:       69.0 in Accession #:    [id number]      Weight:       239.3 lb Date of Birth:  [birth year]      BSA:          2.230 m Patient Age:    48 years  BP:           197/94 mmHg Patient Gender: F               HR:           79 bpm. Exam Location:  Inpatient Procedure: 2D Echo, Cardiac Doppler and Color Doppler Indications:    Dyspnea 786.09 / R06.00  History:        Patient has prior history of Echocardiogram examinations, most                 recent 10/16/2019. CAD, Stroke; Risk Factors:Hypertension,                 Diabetes, Dyslipidemia and Former Smoker.  Sonographer:    Vickie Epley RDCS Referring Phys: [id number] [place]  1. Left ventricular ejection fraction, by estimation, is 60 to 65%. The left ventricle has normal function. The left ventricle has no regional wall motion abnormalities. There is severe left ventricular hypertrophy. Left ventricular diastolic parameters  were normal.  2. Right ventricular systolic function is normal. The right ventricular size is normal. There is normal pulmonary artery systolic pressure. The estimated right ventricular systolic pressure is 45.4 mmHg.  3. A small pericardial effusion is present.  4. The mitral valve is normal in structure. No evidence of mitral valve  regurgitation.  5. The aortic valve was not well visualized. Aortic valve regurgitation is mild. No aortic stenosis is present.  6. The inferior vena cava is dilated in size with >50% respiratory variability, suggesting right atrial pressure of 8 mmHg. FINDINGS  Left Ventricle: Left ventricular ejection fraction, by estimation, is 60 to 65%. The left ventricle has normal function. The left ventricle has no regional wall motion abnormalities. The left ventricular internal cavity size was normal in size. There is  severe left ventricular hypertrophy. Left ventricular diastolic parameters were normal. Right Ventricle: The right ventricular size is normal. No increase in right ventricular wall thickness. Right ventricular systolic function is normal. There is normal pulmonary artery systolic pressure. The tricuspid regurgitant velocity is 2.39 m/s, and  with an assumed right atrial pressure of 8 mmHg, the estimated right ventricular systolic pressure is 09.8 mmHg. Left Atrium: Left atrial size was normal in size. Right Atrium: Right atrial size was normal in size. Pericardium: A small pericardial effusion is present. Mitral Valve: The mitral valve is normal in structure. No evidence of mitral valve regurgitation. Tricuspid Valve: The tricuspid valve is normal in structure. Tricuspid valve regurgitation is trivial. Aortic Valve: The aortic valve was not well visualized. Aortic valve regurgitation is mild. No aortic stenosis is present. Pulmonic Valve: The pulmonic valve was not well visualized. Pulmonic valve regurgitation is trivial. Aorta: The aortic root is normal in size and structure. Venous: The inferior vena cava is dilated in size with greater than 50% respiratory variability, suggesting right atrial pressure of 8 mmHg. IAS/Shunts: The interatrial septum was not well visualized.  LEFT VENTRICLE PLAX 2D LVIDd:         4.90 cm      Diastology LVIDs:         3.60 cm      LV e' medial:    6.92 cm/s LV PW:          1.30 cm      LV E/e' medial:  12.7 LV IVS:        1.30 cm      LV e' lateral:   6.92 cm/s LVOT diam:  2.10 cm      LV E/e' lateral: 12.7 LV SV:         101 LV SV Index:   46 LVOT Area:     3.46 cm  LV Volumes (MOD) LV vol d, MOD A2C: 121.0 ml LV vol d, MOD A4C: 115.0 ml LV vol s, MOD A2C: 48.0 ml LV vol s, MOD A4C: 49.3 ml LV SV MOD A2C:     73.0 ml LV SV MOD A4C:     115.0 ml LV SV MOD BP:      72.5 ml RIGHT VENTRICLE RV S prime:     12.90 cm/s TAPSE (M-mode): 2.8 cm LEFT ATRIUM             Index       RIGHT ATRIUM           Index LA diam:        4.30 cm 1.93 cm/m  RA Area:     16.80 cm LA Vol (A2C):   46.2 ml 20.72 ml/m RA Volume:   38.90 ml  17.45 ml/m LA Vol (A4C):   67.2 ml 30.14 ml/m LA Biplane Vol: 60.0 ml 26.91 ml/m  AORTIC VALVE LVOT Vmax:   145.00 cm/s LVOT Vmean:  112.000 cm/s LVOT VTI:    0.293 m  AORTA Ao Root diam: 2.90 cm MITRAL VALVE               TRICUSPID VALVE MV Area (PHT): 3.77 cm    TR Peak grad:   22.8 mmHg MV Decel Time: 201 msec    TR Vmax:        239.00 cm/s MV E velocity: 87.80 cm/s MV A velocity: 84.40 cm/s  SHUNTS MV E/A ratio:  1.04        Systemic VTI:  0.29 m                            Systemic Diam: 2.10 cm Oswaldo Milian MD Electronically signed by Oswaldo Milian MD Signature Date/Time: 06/25/2020/10:26:49 AM    Final       Subjective: No acute issues or events overnight, denies headache, fever, chills, nausea, vomiting, diarrhea, constipation, chest pain, shortness of breath.  She denies any further bleeding, signs of bleeding, easy bruising, dark or tarry stools.   Discharge Exam: Vitals:   06/25/20 0900 06/25/20 1144  BP: (!) 167/84 (!) 152/81  Pulse:  75  Resp:  20  Temp:  98.5 F (36.9 C)  SpO2:  100%   Vitals:   06/25/20 0400 06/25/20 0800 06/25/20 0900 06/25/20 1144  BP: (!) 170/80 (!) 197/94 (!) 167/84 (!) 152/81  Pulse: 88 83  75  Resp: (!) 21 (!) 21  20  Temp: 98.4 F (36.9 C) 98.6 F (37 C)  98.5 F (36.9 C)  TempSrc: Oral  Oral  Oral  SpO2: 95% 100%  100%  Weight:      Height:        General: Pt is alert, awake, not in acute distress Cardiovascular: RRR, S1/S2 +, no rubs, no gallops Respiratory: CTA bilaterally, no wheezing, no rhonchi Abdominal: Soft, NT, ND, bowel sounds + Extremities: no edema, no cyanosis    The results of significant diagnostics from this hospitalization (including imaging, microbiology, ancillary and laboratory) are listed below for reference.     Microbiology: Recent Results (from the past 240 hour(s))  Respiratory Panel by RT PCR (Flu A&B, Covid) - Nasopharyngeal Swab  Status: None   Collection Time: 06/24/20 10:27 PM   Specimen: Nasopharyngeal Swab  Result Value Ref Range Status   SARS Coronavirus 2 by RT PCR NEGATIVE NEGATIVE Final    Comment: (NOTE) SARS-CoV-2 target nucleic acids are NOT DETECTED.  The SARS-CoV-2 RNA is generally detectable in upper respiratoy specimens during the acute phase of infection. The lowest concentration of SARS-CoV-2 viral copies this assay can detect is 131 copies/mL. A negative result does not preclude SARS-Cov-2 infection and should not be used as the sole basis for treatment or other patient management decisions. A negative result may occur with  improper specimen collection/handling, submission of specimen other than nasopharyngeal swab, presence of viral mutation(s) within the areas targeted by this assay, and inadequate number of viral copies (<131 copies/mL). A negative result must be combined with clinical observations, patient history, and epidemiological information. The expected result is Negative.  Fact Sheet for Patients:  PinkCheek.be  Fact Sheet for Healthcare Providers:  GravelBags.it  This test is no t yet approved or cleared by the [place] FDA and  has been authorized for detection and/or diagnosis of SARS-CoV-2 by FDA under an Emergency Use  Authorization (EUA). This EUA will remain  in effect (meaning this test can be used) for the duration of the COVID-19 declaration under Section 564(b)(1) of the Act, 21 U.S.C. section 360bbb-3(b)(1), unless the authorization is terminated or revoked sooner.     Influenza A by PCR NEGATIVE NEGATIVE Final   Influenza B by PCR NEGATIVE NEGATIVE Final    Comment: (NOTE) The Xpert Xpress SARS-CoV-2/FLU/RSV assay is intended as an aid in  the diagnosis of influenza from Nasopharyngeal swab specimens and  should not be used as a sole basis for treatment. Nasal washings and  aspirates are unacceptable for Xpert Xpress SARS-CoV-2/FLU/RSV  testing.  Fact Sheet for Patients: PinkCheek.be  Fact Sheet for Healthcare Providers: GravelBags.it  This test is not yet approved or cleared by the [place] FDA and  has been authorized for detection and/or diagnosis of SARS-CoV-2 by  FDA under an Emergency Use Authorization (EUA). This EUA will remain  in effect (meaning this test can be used) for the duration of the  Covid-19 declaration under Section 564(b)(1) of the Act, 21  U.S.C. section 360bbb-3(b)(1), unless the authorization is  terminated or revoked. Performed at [facility], [place] [address] [place], [place] [address]      Labs: BNP (last 3 results) Recent Labs    06/24/20 2130  BNP 361.4*   Basic Metabolic Panel: Recent Labs  Lab 06/24/20 1651 06/25/20 0245  NA 139 139  K 3.7 3.5  CL 107 107  CO2 20* 21*  GLUCOSE 216* 136*  BUN 22* 18  CREATININE 1.60* 1.49*  CALCIUM 8.7* 8.6*   Liver Function Tests: Recent Labs  Lab 06/24/20 1651  AST 21  ALT 28  ALKPHOS 69  BILITOT 0.4  PROT 6.6  ALBUMIN 3.0*   No results for input(s): LIPASE, AMYLASE in the last 168 hours. No results for input(s): AMMONIA in the last 168 hours. CBC: Recent Labs  Lab 06/24/20 1651 06/25/20 0245  WBC 10.9* 11.5*  HGB  7.0* 7.7*  HCT 25.7* 26.6*  MCV 71.6* 72.7*  PLT 253 249   Cardiac Enzymes: No results for input(s): CKTOTAL, CKMB, CKMBINDEX, TROPONINI in the last 168 hours. BNP: Invalid input(s): POCBNP CBG: Recent Labs  Lab 06/25/20 1139  GLUCAP 82   D-Dimer No results for input(s): DDIMER in the last  72 hours. Hgb A1c No results for input(s): HGBA1C in the last 72 hours. Lipid Profile No results for input(s): CHOL, HDL, LDLCALC, TRIG, CHOLHDL, LDLDIRECT in the last 72 hours. Thyroid function studies No results for input(s): TSH, T4TOTAL, T3FREE, THYROIDAB in the last 72 hours.  Invalid input(s): FREET3 Anemia work up Recent Labs    06/24/20 2130  FERRITIN 7*  TIBC 466*  IRON 20*   Urinalysis    Component Value Date/Time   COLORURINE YELLOW 02/11/2019 1740   APPEARANCEUR HAZY (A) 02/11/2019 1740   LABSPEC 1.011 02/11/2019 1740   PHURINE 6.0 02/11/2019 1740   GLUCOSEU >=500 (A) 02/11/2019 1740   HGBUR SMALL (A) 02/11/2019 1740   BILIRUBINUR NEGATIVE 02/11/2019 1740   KETONESUR NEGATIVE 02/11/2019 1740   PROTEINUR 100 (A) 02/11/2019 1740   UROBILINOGEN 0.2 08/25/2011 2043   NITRITE NEGATIVE 02/11/2019 1740   LEUKOCYTESUR NEGATIVE 02/11/2019 1740   Sepsis Labs Invalid input(s): PROCALCITONIN,  WBC,  LACTICIDVEN Microbiology Recent Results (from the past 240 hour(s))  Respiratory Panel by RT PCR (Flu A&B, Covid) - Nasopharyngeal Swab     Status: None   Collection Time: 06/24/20 10:27 PM   Specimen: Nasopharyngeal Swab  Result Value Ref Range Status   SARS Coronavirus 2 by RT PCR NEGATIVE NEGATIVE Final    Comment: (NOTE) SARS-CoV-2 target nucleic acids are NOT DETECTED.  The SARS-CoV-2 RNA is generally detectable in upper respiratoy specimens during the acute phase of infection. The lowest concentration of SARS-CoV-2 viral copies this assay can detect is 131 copies/mL. A negative result does not preclude SARS-Cov-2 infection and should not be used as the sole basis  for treatment or other patient management decisions. A negative result may occur with  improper specimen collection/handling, submission of specimen other than nasopharyngeal swab, presence of viral mutation(s) within the areas targeted by this assay, and inadequate number of viral copies (<131 copies/mL). A negative result must be combined with clinical observations, patient history, and epidemiological information. The expected result is Negative.  Fact Sheet for Patients:  PinkCheek.be  Fact Sheet for Healthcare Providers:  GravelBags.it  This test is no t yet approved or cleared by the [place] FDA and  has been authorized for detection and/or diagnosis of SARS-CoV-2 by FDA under an Emergency Use Authorization (EUA). This EUA will remain  in effect (meaning this test can be used) for the duration of the COVID-19 declaration under Section 564(b)(1) of the Act, 21 U.S.C. section 360bbb-3(b)(1), unless the authorization is terminated or revoked sooner.     Influenza A by PCR NEGATIVE NEGATIVE Final   Influenza B by PCR NEGATIVE NEGATIVE Final    Comment: (NOTE) The Xpert Xpress SARS-CoV-2/FLU/RSV assay is intended as an aid in  the diagnosis of influenza from Nasopharyngeal swab specimens and  should not be used as a sole basis for treatment. Nasal washings and  aspirates are unacceptable for Xpert Xpress SARS-CoV-2/FLU/RSV  testing.  Fact Sheet for Patients: PinkCheek.be  Fact Sheet for Healthcare Providers: GravelBags.it  This test is not yet approved or cleared by the [place] FDA and  has been authorized for detection and/or diagnosis of SARS-CoV-2 by  FDA under an Emergency Use Authorization (EUA). This EUA will remain  in effect (meaning this test can be used) for the duration of the  Covid-19 declaration under Section 564(b)(1) of the Act, 21   U.S.C. section 360bbb-3(b)(1), unless the authorization is  terminated or revoked. Performed at [facility], [address],  [place] [address]      Time coordinating discharge: Over 30 minutes  SIGNED:   Little Ishikawa, DO [facility] 06/25/2020, 12:01 PM Pager   If 7PM-7AM, please contact night-coverage [email or web address]

## 2020-06-26 ENCOUNTER — Encounter (INDEPENDENT_AMBULATORY_CARE_PROVIDER_SITE_OTHER): Payer: Self-pay | Admitting: Ophthalmology

## 2020-06-26 ENCOUNTER — Other Ambulatory Visit: Payer: Self-pay

## 2020-06-26 ENCOUNTER — Ambulatory Visit (INDEPENDENT_AMBULATORY_CARE_PROVIDER_SITE_OTHER): Payer: Medicaid Other | Admitting: Ophthalmology

## 2020-06-26 VITALS — BP 140/63 | HR 74

## 2020-06-26 DIAGNOSIS — H3581 Retinal edema: Secondary | ICD-10-CM | POA: Diagnosis not present

## 2020-06-26 DIAGNOSIS — G932 Benign intracranial hypertension: Secondary | ICD-10-CM

## 2020-06-26 DIAGNOSIS — E113413 Type 2 diabetes mellitus with severe nonproliferative diabetic retinopathy with macular edema, bilateral: Secondary | ICD-10-CM

## 2020-06-26 DIAGNOSIS — H35033 Hypertensive retinopathy, bilateral: Secondary | ICD-10-CM

## 2020-06-26 DIAGNOSIS — I10 Essential (primary) hypertension: Secondary | ICD-10-CM

## 2020-06-26 DIAGNOSIS — Z8673 Personal history of transient ischemic attack (TIA), and cerebral infarction without residual deficits: Secondary | ICD-10-CM

## 2020-06-26 DIAGNOSIS — H25813 Combined forms of age-related cataract, bilateral: Secondary | ICD-10-CM

## 2020-07-01 LAB — GLUCOSE, CAPILLARY: Glucose-Capillary: 244 mg/dL — ABNORMAL HIGH (ref 70–99)

## 2020-07-14 ENCOUNTER — Encounter: Payer: Self-pay | Admitting: Obstetrics

## 2020-07-14 ENCOUNTER — Other Ambulatory Visit: Payer: Self-pay

## 2020-07-14 ENCOUNTER — Ambulatory Visit (INDEPENDENT_AMBULATORY_CARE_PROVIDER_SITE_OTHER): Payer: Medicaid Other | Admitting: Obstetrics

## 2020-07-14 VITALS — BP 127/78 | HR 79 | Wt 246.0 lb

## 2020-07-14 DIAGNOSIS — E139 Other specified diabetes mellitus without complications: Secondary | ICD-10-CM | POA: Diagnosis not present

## 2020-07-14 DIAGNOSIS — I10 Essential (primary) hypertension: Secondary | ICD-10-CM

## 2020-07-14 DIAGNOSIS — N939 Abnormal uterine and vaginal bleeding, unspecified: Secondary | ICD-10-CM | POA: Diagnosis not present

## 2020-07-14 DIAGNOSIS — F17200 Nicotine dependence, unspecified, uncomplicated: Secondary | ICD-10-CM

## 2020-07-14 DIAGNOSIS — Z8673 Personal history of transient ischemic attack (TIA), and cerebral infarction without residual deficits: Secondary | ICD-10-CM | POA: Diagnosis not present

## 2020-07-14 DIAGNOSIS — I2510 Atherosclerotic heart disease of native coronary artery without angina pectoris: Secondary | ICD-10-CM

## 2020-07-14 DIAGNOSIS — N189 Chronic kidney disease, unspecified: Secondary | ICD-10-CM

## 2020-07-14 DIAGNOSIS — E669 Obesity, unspecified: Secondary | ICD-10-CM

## 2020-07-14 NOTE — Progress Notes (Signed)
Pt was in hospital on 11/18- here today for f/u. Pt states she is doing well currently.  Denies any bleeding at this time. Pt is taking iron supplement.

## 2020-07-14 NOTE — Progress Notes (Signed)
Patient ID: [name], female   DOB: Nov 23, 1970, 49 y.o.   MRN: [id number]  Chief Complaint  Patient presents with  . Follow-up    hospital follow up - anemia  . Anemia    HPI [name] is a 49 y.o. female.  Presents in follow up after receiving a blood transfusion for severe anemia.  The patient had normal periods until after having a stroke in July last year.  Periods were irregular and sometimes heavy thereafter.  She had a second stroke in December last year.  Periods continued to be irregular, but not real heavy until August of this year, when she had heavy prolonged vaginal bleeding.  Endometrial biopsy was done, and was WNL's.  Ultrasound was also WNL's except for suggestion of adenomyosis.  Her other PMH is significant for Diabetes, HTN , CVD, Chronic Kidney Disease, Obesity and Asthma. HPI  Past Medical History:  Diagnosis Date  . Arthritis   . Asthma   . Cataract    Mixed form OU  . CKD (chronic kidney disease)   . Coronary artery disease   . Diabetes mellitus   . Diabetic retinopathy ([place])    NPDR OU  . Hyperlipidemia   . Hypertension   . Hypertensive retinopathy    OU  . Left thyroid nodule    diagnosed 07/2018  . Pseudotumor cerebri   . Stroke ([place])   . Vitamin D deficiency     Past Surgical History:  Procedure Laterality Date  . ACHILLES TENDON REPAIR    . TUBAL LIGATION      Family History  Problem Relation Age of Onset  . Asthma Mother   . Hypertension Father   . Kidney disease Father   . Asthma Other   . Hyperlipidemia Other   . Hypertension Other   . Cancer Other   . Diabetes Maternal Grandmother   . Congestive Heart Failure Maternal Grandmother   . Hypertension Sister   . Asthma Sister   . Congestive Heart Failure Maternal Aunt     Social History Social History   Tobacco Use  . Smoking status: Former Smoker    Packs/day: 0.50    Years: 30.00    Pack years: 15.00    Types: Cigarettes    Quit date: 2017    Years since  quitting: 4.9  . Smokeless tobacco: Former Systems developer    Quit date: 03/31/2016  Vaping Use  . Vaping Use: Never used  Substance Use Topics  . Alcohol use: Yes    Alcohol/week: 0.0 standard drinks    Comment: rare  . Drug use: Yes    Types: Marijuana    Comment: Daily    No Known Allergies  Current Outpatient Medications  Medication Sig Dispense Refill  . Accu-Chek FastClix Lancets MISC 4 (four) times daily.    Marland Kitchen ACCU-CHEK GUIDE test strip See admin instructions. for testing    . albuterol (VENTOLIN HFA) 108 (90 Base) MCG/ACT inhaler Inhale into the lungs every 6 (six) hours as needed for wheezing or shortness of breath.    Marland Kitchen amLODipine-benazepril (LOTREL) 10-40 MG capsule Take 1 capsule by mouth daily. 90 capsule 2  . aspirin EC 81 MG EC tablet Take 1 tablet (81 mg total) by mouth daily.    . blood glucose meter kit and supplies KIT Dispense based on patient and insurance preference. Use up to four times daily as directed. (FOR ICD-9 250.00, 250.01). 1 each 0  . carvedilol (COREG) 25 MG tablet Take  1 tablet (25 mg total) by mouth 2 (two) times daily with a meal. 180 tablet 2  . Cholecalciferol (VITAMIN D3) 25 MCG (1000 UT) CAPS Take 1 capsule by mouth daily.    . cloNIDine (CATAPRES) 0.2 MG tablet Take 1 tablet (0.2 mg total) by mouth 2 (two) times daily. (Patient not taking: Reported on 06/24/2020) 60 tablet 2  . ferrous sulfate (SLOW IRON) 160 (50 Fe) MG TBCR SR tablet Take 1 tablet (160 mg total) by mouth daily. 30 tablet 3  . folic acid (FOLVITE) 1 MG tablet Take 1 mg by mouth daily.    . furosemide (LASIX) 20 MG tablet Take 40 mg by mouth 2 (two) times daily.     Marland Kitchen glipiZIDE (GLUCOTROL) 10 MG tablet Take 10 mg by mouth daily.    Marland Kitchen ibuprofen (ADVIL) 800 MG tablet Take 1 tablet (800 mg total) by mouth 3 (three) times daily with meals. (Patient not taking: Reported on 06/24/2020) 21 tablet 0  . insulin aspart protamine- aspart (NOVOLOG MIX 70/30) (70-30) 100 UNIT/ML injection Inject 0.15  mLs (15 Units total) into the skin 2 (two) times daily with a meal. (Patient taking differently: Inject 15-17 Units into the skin See admin instructions. Inject 15 units subcutaneous in the morning, then inject 17 units subcutaneous in the evening) 10 mL 11  . Insulin Syringes, Disposable, D-428 1 ML MISC 1 application by Does not apply route 2 (two) times a day. 100 each 0  . ipratropium-albuterol (DUONEB) 0.5-2.5 (3) MG/3ML SOLN Take 3 mLs by nebulization every 4 (four) hours as needed. 360 mL 0  . isosorbide-hydrALAZINE (BIDIL) 20-37.5 MG tablet Take 2 tablets by mouth 3 (three) times daily. 180 tablet 3  . metFORMIN (GLUCOPHAGE) 1000 MG tablet Take 1,000 mg by mouth 2 (two) times daily.    . rosuvastatin (CRESTOR) 10 MG tablet TAKE 1 TABLET BY MOUTH EVERY DAY (Patient taking differently: Take 10 mg by mouth daily. ) 90 tablet 3  . vitamin B-12 (CYANOCOBALAMIN) 1000 MCG tablet Take 1,000 mcg by mouth daily.     No current facility-administered medications for this visit.    Review of Systems Review of Systems Constitutional: negative for fatigue and weight loss Respiratory: negative for cough and wheezing Cardiovascular: negative for chest pain, fatigue and palpitations Gastrointestinal: negative for abdominal pain and change in bowel habits Genitourinary: positive for heavy, irregular periods Integument/breast: negative for nipple discharge Musculoskeletal:negative for myalgias Neurological: negative for gait problems and tremors Behavioral/Psych: negative for abusive relationship, depression Endocrine: negative for temperature intolerance      Blood pressure 127/78, pulse 79, weight 246 lb (111.6 kg).  Physical Exam Physical Exam: Deferred   50% of 15 min visit spent on counseling and coordination of care.   Data Reviewed Ultrasound: US PELVIC COMPLETE WITH TRANSVAGINAL (Accession [id number]) (Order [id number]) Imaging Date: 04/06/2020 Department: [facility]  Outpatient Ultrasound Released By: Ginette Otto Authorizing: Shelly Bombard, MD  Exam Status  Status  Final [99]  PACS Intelerad Image Link  Show images for US PELVIC COMPLETE WITH TRANSVAGINAL Study Result  Narrative & Impression  CLINICAL DATA:  Abnormal uterine bleeding; LMP 11/23/2019  EXAM: TRANSABDOMINAL AND TRANSVAGINAL ULTRASOUND OF PELVIS  TECHNIQUE: Both transabdominal and transvaginal ultrasound examinations of the pelvis were performed. Transabdominal technique was performed for global imaging of the pelvis including uterus, ovaries, adnexal regions, and pelvic cul-de-sac. It was necessary to proceed with endovaginal exam following the transabdominal exam to visualize the endometrium and LEFT ovary.  COMPARISON:  12/23/2019  FINDINGS: Uterus  Measurements: 9.0 x 5.2 x 6.1 cm = volume: 151 mL. Anteverted. Heterogeneous myometrium. Linear areas of shadowing. Cannot exclude adenomyosis. No focal uterine mass. Multiple nabothian cysts at cervix.  Endometrium  Thickness: 8 mm.  No endometrial fluid or focal abnormality.  Right ovary  Measurements: 3.5 x 2.8 x 3.8 cm = volume: 90.7 mL. Seen only on transabdominal imaging. Normal morphology without mass  Left ovary  Measurements: 4.1 x 2.3 x 3.0 cm = volume: 14.4 mL. Normal morphology without mass  Other findings  No free pelvic fluid or adnexal masses.  IMPRESSION: Heterogeneous myometrium with scattered areas of shadowing, cannot exclude adenomyosis.  Remainder of exam unremarkable.  Specifically, endometrial complex 8 mm thick, normal for a premenopausal patient; if bleeding remains unresponsive to hormonal or medical therapy, sonohysterogram should be considered for focal lesion work-up. (Ref: Radiological Reasoning: Algorithmic Workup of Abnormal Vaginal Bleeding with Endovaginal Sonography and Sonohysterography. AJR 2008; 601:U93-23)   Electronically Signed   By:  Lavonia Dana M.D.   On: 04/06/2020 09:15      Assessment     1. Abnormal uterine .(AUB) - discussed options for future management.  She is a medical risk for hormonal therapy or major surgical therapy.  Endometrial Ablation recommended, and a brochure was dispensed.  2. Diabetes 1.5, managed as type 2 ([place]) - managed by PCP  3. Essential hypertension - managed by PCP  4. H/O: stroke - clinically stable  5. Cardiovascular disease - clinically stable  6. Chronic kidney disease, unspecified CKD stage - clinically stable  7. Obesity (BMI 30-39.9) - program of caloric reduction, exercise and behavioral modification recommended  8. Tobacco dependence - cessation recommended    Plan   Follow up in 2 weeks.  Shelly Bombard, MD 07/14/2020 9:18 AM

## 2020-07-15 NOTE — Progress Notes (Signed)
Patient referred by [name], MD for hypertensive heart disease  Subjective:   [name], female    DOB: 1970-11-02, 49 y.o.   MRN: [id number]   Chief Complaint  Patient presents with  . Congestive Heart Failure  . Hospitalization Follow-up    Referred by Dr. Vista Lawman    49 y.o. African-American female with hypertension, uncontrolled type 2 diabetes mellitus, hyperlipidemia, tobacco abuse, recurrent strokes.  Patient was seen in [facility] emergency department patient was hospitalized in 06/2020 with complaints of anemia discovered at nephrologist office.  Hemoglobin was reportedly down to as low as 6.2, stayed between 7 and 8 blood in the hospital.  She has been having heavy menstrual fusions for 2 months prior to this event.  She was transfused 1 unit PRBC.  Discharge summary mentions triggering discussion about chronic need for an replacement given her severe deficiency anemia.  She indicated that she had previously been following her PCP and gynecology for prolonged menses, but had difficulty following up due to transportation issues.  During hospitalization, she also had mildly elevated BNP at 427, and mildly elevated troponin at bedtime at 18.  She was recommended follow-up with me given possibility of congestive heart failure.  Patient is here for hospital follow up.  She has not had shortness of breath, leg edema since hospital discharge.  She is feeling much better after transfusion.  She is awaiting potential endometrial ablation by Dr. Baltazar Najjar.   Current Outpatient Medications on File Prior to Visit  Medication Sig Dispense Refill  . Accu-Chek FastClix Lancets MISC 4 (four) times daily.    Marland Kitchen ACCU-CHEK GUIDE test strip See admin instructions. for testing    . albuterol (VENTOLIN HFA) 108 (90 Base) MCG/ACT inhaler Inhale into the lungs every 6 (six) hours as needed for wheezing or shortness of breath.    Marland Kitchen amLODipine-benazepril (LOTREL) 10-40 MG  capsule Take 1 capsule by mouth daily. 90 capsule 2  . aspirin EC 81 MG EC tablet Take 1 tablet (81 mg total) by mouth daily.    . blood glucose meter kit and supplies KIT Dispense based on patient and insurance preference. Use up to four times daily as directed. (FOR ICD-9 250.00, 250.01). 1 each 0  . carvedilol (COREG) 25 MG tablet Take 1 tablet (25 mg total) by mouth 2 (two) times daily with a meal. 180 tablet 2  . Cholecalciferol (VITAMIN D3) 25 MCG (1000 UT) CAPS Take 1 capsule by mouth daily.    . ferrous sulfate (SLOW IRON) 160 (50 Fe) MG TBCR SR tablet Take 1 tablet (160 mg total) by mouth daily. 30 tablet 3  . folic acid (FOLVITE) 1 MG tablet Take 1 mg by mouth daily.    . furosemide (LASIX) 20 MG tablet Take 40 mg by mouth 2 (two) times daily.     Marland Kitchen glipiZIDE (GLUCOTROL) 10 MG tablet Take 10 mg by mouth daily.    . hydrOXYzine (VISTARIL) 25 MG capsule Take 25 mg by mouth daily as needed.    . insulin aspart protamine- aspart (NOVOLOG MIX 70/30) (70-30) 100 UNIT/ML injection Inject 0.15 mLs (15 Units total) into the skin 2 (two) times daily with a meal. (Patient taking differently: Inject 15-17 Units into the skin See admin instructions. Inject 15 units subcutaneous in the morning, then inject 17 units subcutaneous in the evening) 10 mL 11  . Insulin Syringes, Disposable, L-373 1 ML MISC 1 application by Does not apply route 2 (two) times a  day. 100 each 0  . ipratropium-albuterol (DUONEB) 0.5-2.5 (3) MG/3ML SOLN Take 3 mLs by nebulization every 4 (four) hours as needed. 360 mL 0  . isosorbide-hydrALAZINE (BIDIL) 20-37.5 MG tablet Take 2 tablets by mouth 3 (three) times daily. 180 tablet 3  . metFORMIN (GLUCOPHAGE) 1000 MG tablet Take 1,000 mg by mouth 2 (two) times daily.    . rosuvastatin (CRESTOR) 10 MG tablet TAKE 1 TABLET BY MOUTH EVERY DAY (Patient taking differently: Take 10 mg by mouth daily.) 90 tablet 3  . vitamin B-12 (CYANOCOBALAMIN) 1000 MCG tablet Take 1,000 mcg by mouth daily.      No current facility-administered medications on file prior to visit.    Cardiovascular studies:  EKG 06/24/2020: Sinus rhythm 79 bpm. Left atrial enlargement.  Nonspecific T-abnormality.   Echocardiogram 06/25/2020:  1. Left ventricular ejection fraction, by estimation, is 60 to 65%. The  left ventricle has normal function. The left ventricle has no regional  wall motion abnormalities. There is severe left ventricular hypertrophy.  Left ventricular diastolic parameters  were normal.  2. Right ventricular systolic function is normal. The right ventricular  size is normal. There is normal pulmonary artery systolic pressure. The  estimated right ventricular systolic pressure is 73.4 mmHg.  3. A small pericardial effusion is present.  4. The mitral valve is normal in structure. No evidence of mitral valve  regurgitation.  5. The aortic valve was not well visualized. Aortic valve regurgitation  is mild. No aortic stenosis is present.  6. The inferior vena cava is dilated in size with >50% respiratory  variability, suggesting right atrial pressure of 8 mmHg.   Event monitor 09/17/2019 - 10/16/2019: Diagnostic time: 94%  Dominant rhythm: Sinus. HR 66-112 bpm. Avg HR 80 bpm. 6 episodes of NSVT up to 9 beats seen.  No atrial fibrillation/atrial flutter/SVT/high grade AV block, sinus pause >3sec noted. Symptoms reported: None  MRI brain 08/20/2019: - Right frontal parasagittal and anterior corpus callosum lesion which is hyperintense on DWI, slightly hypointense on ADC map, hyperintense on T2 and FLAIR views, with heterogeneous enhancement.  Considerations could include subacute infarction, autoimmune, inflammatory or neoplastic etiologies. This is a new finding compared to 02/11/2019. - Mild scattered periventricular, subcortical, pontine chronic small vessel ischemic disease.  CTA head/neck 02/12/2019: 1. Diffuse distal branch vessel irregularity throughout the circle[place]of-[place]  without a significant proximal stenosis or occlusion. 2. Atherosclerotic changes are evident within the cavernous internal carotid arteries and right greater than left M1 segments with diffuse vessel irregularity and mild narrowing of less than 50% relative to the more distal vessels. 3. Tortuosity of the cervical internal carotid arteries and vertebral arteries bilaterally without significant stenosis. This is nonspecific, but often seen in the setting of chronic hypertension. 4. Brainstem infarct and white matter parenchymal changes noted on MRI are not discernible by CT.  MRI brain 02/11/2019: 1. Patchy small volume acute ischemic nonhemorrhagic infarct involving the left lateral midbrain/pons. 2. Underlying mild chronic microvascular ischemic disease.   Recent labs: 06/25/2020: Glucose 136, BUN/Cr 18/1.49. EGFR 43. Na/K 139/3.5.  H/H 7.7/26.6. MCV 72.7. Platelets 249 Results for [name], [name] (MRN [id number]) as of 07/15/2020 16:09  Ref. Range 06/24/2020 21:30 06/24/2020 23:14  B Natriuretic Peptide Latest Ref Range: 0.0 - 100.0 pg/mL 427.9 (H)   Troponin I (High Sensitivity) Latest Ref Range: <18 ng/L 17 18 (H)     11/28/2019: Glucose 177, BUN/Cr 33/2.33. EGFR 28. Na/K 138/4.0. Rest of the CMP normal H/H 11/32. MCV 86. Platelets 254 Chol  167, TG 287, HDL 39, LDL 81  03/12/2019: Glucose 170.  BUN/creatinine 26/1.3.  EGFR 57.  NA/K1 39/4.2. H/H 12.6/37.5.  MCV 91.  Platelets 245.  Normal iron studies. Hemoglobin A1c 8.5%. Cholesterol 106, triglycerides 103, HDL 38, LDL 47   Review of Systems  Cardiovascular: Positive for dyspnea on exertion. Negative for chest pain, leg swelling, palpitations and syncope.  Genitourinary: Negative for dysuria.  Neurological: Positive for numbness.       Foot drop  All other systems reviewed and are negative.        Vitals:   07/16/20 0840  BP: (!) 162/82  Pulse: 79  SpO2: 97%     Objective:   Physical Exam Vitals and  nursing note reviewed.  Constitutional:      General: She is not in acute distress. HENT:     Head: Normocephalic and atraumatic.  Neck:     Vascular: No JVD.  Cardiovascular:     Rate and Rhythm: Normal rate and regular rhythm.     Pulses: Intact distal pulses.     Heart sounds: Murmur heard.  Early systolic murmur is present with a grade of 2/6 at the lower right sternal border.   Pulmonary:     Effort: Pulmonary effort is normal.     Breath sounds: Normal breath sounds. No rales.  Neurological:     Cranial Nerves: No cranial nerve deficit.           Assessment & Recommendations:   49 y.o. 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  Resistant hypertension: Home blood pressure log shows well-controlled blood pressures, although mildly elevated today likely due to whitecoat syndrome. Continue current antihypertensive therapy, including carvedilol 25 mg bid, amlodipine-benazepril 10-40 mg daily,Bidil from 2 tab tid, HCTZ 25 mg daily,  Mild elevated BNP and troponin on recent hospital admission primarily triggered due to supply demand mismatch in the setting of severe anemia.  No clinical heart failure at this time.  Preoperative stratification: Low cardiac risk for endometrial ablation.  Okay to proceed.  History of stroke: No Afib on even r monitor.  Continue Aspirin, statin.  Type 2 diabetes mellitus: Uncontrolled.  Management as per PCP.  F/u in 9month   MHeath MD PContra Costa Regional Medical CenterCardiovascular. PA Pager: [phone]: [phone] no answer Cell [phone]

## 2020-07-16 ENCOUNTER — Ambulatory Visit: Payer: Medicaid Other | Admitting: Cardiology

## 2020-07-16 ENCOUNTER — Encounter: Payer: Self-pay | Admitting: Cardiology

## 2020-07-16 ENCOUNTER — Other Ambulatory Visit: Payer: Self-pay

## 2020-07-16 VITALS — BP 162/82 | HR 79 | Ht 69.0 in | Wt 246.8 lb

## 2020-07-16 DIAGNOSIS — I5032 Chronic diastolic (congestive) heart failure: Secondary | ICD-10-CM

## 2020-07-16 DIAGNOSIS — E782 Mixed hyperlipidemia: Secondary | ICD-10-CM

## 2020-07-16 DIAGNOSIS — I10 Essential (primary) hypertension: Secondary | ICD-10-CM

## 2020-07-29 ENCOUNTER — Telehealth (INDEPENDENT_AMBULATORY_CARE_PROVIDER_SITE_OTHER): Payer: Medicaid Other | Admitting: Obstetrics

## 2020-07-29 DIAGNOSIS — E669 Obesity, unspecified: Secondary | ICD-10-CM

## 2020-07-29 DIAGNOSIS — Z8673 Personal history of transient ischemic attack (TIA), and cerebral infarction without residual deficits: Secondary | ICD-10-CM

## 2020-07-29 DIAGNOSIS — N939 Abnormal uterine and vaginal bleeding, unspecified: Secondary | ICD-10-CM | POA: Diagnosis not present

## 2020-07-29 DIAGNOSIS — I2510 Atherosclerotic heart disease of native coronary artery without angina pectoris: Secondary | ICD-10-CM | POA: Diagnosis not present

## 2020-07-29 DIAGNOSIS — F17200 Nicotine dependence, unspecified, uncomplicated: Secondary | ICD-10-CM

## 2020-07-29 DIAGNOSIS — N189 Chronic kidney disease, unspecified: Secondary | ICD-10-CM

## 2020-07-29 DIAGNOSIS — E139 Other specified diabetes mellitus without complications: Secondary | ICD-10-CM

## 2020-07-29 DIAGNOSIS — I10 Essential (primary) hypertension: Secondary | ICD-10-CM

## 2020-07-29 NOTE — Progress Notes (Signed)
GYNECOLOGY VIRTUAL VISIT ENCOUNTER NOTE  Provider location: [facility]   I connected with [name] on 07/29/20 at  9:00 AM EST by MyChart Video Encounter at home and verified that I am speaking with the correct person using two identifiers.   I discussed the limitations, risks, security and privacy concerns of performing an evaluation and management service virtually and the availability of in person appointments. I also discussed with the patient that there may be a patient responsible charge related to this service. The patient expressed understanding and agreed to proceed.   History:  [name] is a 49 y.o. [phone] female being evaluated today for follow up for AUB.  She has a history of hypertension, uncontrolled diabetes, stroke and cardiovascular disease.  Recently seen in ER for angina and found to have severe anemia, for which she received a blood transfusion, and is now feeling much better.  Recent pelvic ultrasound was normal.  Options were discussed for the management of her AUB, and she is interested in Endometrial Ablation.  She had a visit with her cardiologist on 07-16-2020 and he did give medical clearance for the procedure ( see note in chart ).  She denies any abnormal vaginal discharge, bleeding, pelvic pain or other concerns.       Past Medical History:  Diagnosis Date  . Arthritis   . Asthma   . Cataract    Mixed form OU  . CKD (chronic kidney disease)   . Coronary artery disease   . Diabetes mellitus   . Diabetic retinopathy ([place])    NPDR OU  . Hyperlipidemia   . Hypertension   . Hypertensive retinopathy    OU  . Left thyroid nodule    diagnosed 07/2018  . Pseudotumor cerebri   . Stroke ([place])   . Vitamin D deficiency    Past Surgical History:  Procedure Laterality Date  . ACHILLES TENDON REPAIR    . TUBAL LIGATION     The following portions of the patient's history were reviewed and updated as appropriate:  allergies, current medications, past family history, past medical history, past social history, past surgical history and problem list.   Health Maintenance:  Normal pap and negative HRHPV on 12-12-2019.  Normal mammogram on 05-05-2020.   Review of Systems:  Pertinent items noted in HPI and remainder of comprehensive ROS otherwise negative.  Physical Exam:   General:  Alert, oriented and cooperative. Patient appears to be in no acute distress.  Mental Status: Normal mood and affect. Normal behavior. Normal judgment and thought content.   Respiratory: Normal respiratory effort, no problems with respiration noted  Rest of physical exam deferred due to type of encounter  Labs and Imaging Results for Teresa, [name] (MRN [id number]) as of 07/29/2020 08:29  Ref. Range 06/25/2020 81:44  BASIC METABOLIC PANEL Unknown Rpt (A)  Sodium Latest Ref Range: 135 - 145 mmol/L 139  Potassium Latest Ref Range: 3.5 - 5.1 mmol/L 3.5  Chloride Latest Ref Range: 98 - 111 mmol/L 107  CO2 Latest Ref Range: 22 - 32 mmol/L 21 (L)  Glucose Latest Ref Range: 70 - 99 mg/dL 136 (H)  BUN Latest Ref Range: 6 - 20 mg/dL 18  Creatinine Latest Ref Range: 0.44 - 1.00 mg/dL 1.49 (H)  Calcium Latest Ref Range: 8.9 - 10.3 mg/dL 8.6 (L)  Anion gap Latest Ref Range: 5 - 15  11  GFR, Estimated Latest Ref Range: >60 mL/min 43 (L)  WBC Latest Ref Range: 4.0 - 10.5 K/uL 11.5 (H)  RBC Latest Ref Range: 3.87 - 5.11 MIL/uL 3.66 (L)  Hemoglobin Latest Ref Range: 12.0 - 15.0 g/dL 7.7 (L)  HCT Latest Ref Range: 36.0 - 46.0 % 26.6 (L)  MCV Latest Ref Range: 80.0 - 100.0 fL 72.7 (L)  MCH Latest Ref Range: 26.0 - 34.0 pg 21.0 (L)  MCHC Latest Ref Range: 30.0 - 36.0 g/dL 28.9 (L)  RDW Latest Ref Range: 11.5 - 15.5 % 21.9 (H)  Platelets Latest Ref Range: 150 - 400 K/uL 249  nRBC Latest Ref Range: 0.0 - 0.2 % 0.0     US PELVIC COMPLETE WITH TRANSVAGINAL (Accession [id number]) (Order [id number]) Imaging Date: 04/06/2020 Department:  [facility] Released By: Ginette Otto Authorizing: Shelly Bombard, MD    Exam Status  Status  Final [99]   PACS Intelerad Image Link  Show images for US PELVIC COMPLETE WITH TRANSVAGINAL  Study Result  Narrative & Impression  CLINICAL DATA:  Abnormal uterine bleeding; LMP 11/23/2019  EXAM: TRANSABDOMINAL AND TRANSVAGINAL ULTRASOUND OF PELVIS  TECHNIQUE: Both transabdominal and transvaginal ultrasound examinations of the pelvis were performed. Transabdominal technique was performed for global imaging of the pelvis including uterus, ovaries, adnexal regions, and pelvic cul-de-sac. It was necessary to proceed with endovaginal exam following the transabdominal exam to visualize the endometrium and LEFT ovary.  COMPARISON:  12/23/2019  FINDINGS: Uterus  Measurements: 9.0 x 5.2 x 6.1 cm = volume: 151 mL. Anteverted. Heterogeneous myometrium. Linear areas of shadowing. Cannot exclude adenomyosis. No focal uterine mass. Multiple nabothian cysts at cervix.  Endometrium  Thickness: 8 mm.  No endometrial fluid or focal abnormality.  Right ovary  Measurements: 3.5 x 2.8 x 3.8 cm = volume: 90.7 mL. Seen only on transabdominal imaging. Normal morphology without mass  Left ovary  Measurements: 4.1 x 2.3 x 3.0 cm = volume: 14.4 mL. Normal morphology without mass  Other findings  No free pelvic fluid or adnexal masses.  IMPRESSION: Heterogeneous myometrium with scattered areas of shadowing, cannot exclude adenomyosis.  Remainder of exam unremarkable.  Specifically, endometrial complex 8 mm thick, normal for a premenopausal patient; if bleeding remains unresponsive to hormonal or medical therapy, sonohysterogram should be considered for focal lesion work-up. (Ref: Radiological Reasoning: Algorithmic Workup of Abnormal Vaginal Bleeding with Endovaginal Sonography and Sonohysterography. AJR 2008;  829:F62-13)   Electronically Signed   By: Lavonia Dana M.D.   On: 04/06/2020 09:15         SURGICAL PATHOLOGY Surgical pathology( [place]) Collected: 12/27/19 1048  Lab status: Final-Edited  Resulting lab: [facility]  Value: SURGICAL PATHOLOGY  CASE: [id number]  PATIENT: [place]  Surgical Pathology Report      Clinical History: AUB (cm)      FINAL MICROSCOPIC DIAGNOSIS:   A. ENDOMETRIUM, BIOPSY:  - Benign proliferative endometrium  - Negative for hyperplasia or malignancy     GROSS DESCRIPTION:   Received in formalin are tan-red to dark red soft tissue fragments that  are entirely submitted.  Volume: 2 x 2 x 0.4 cm. (2 B)   SW 12/28/2019      Final Diagnosis performed by Jaquita Folds, MD.  Electronically  signed 12/30/2019  Technical component performed at [facility]. [facility], [place] [address], [place], [place] [address].  Professional component performed at [facility],  [place] [address] [place], [place] [address].  Immunohistochemistry Technical component (if applicable) was performed  at [facility] [address] [place],  [place], [place] [address].  IMMUNOHISTOCHEMISTRY DISCLAIMER (if applicable):  Some of these immunohistochemical stains may have been developed and the  performance characteristics determine by [facility]. Some  may not have been cleared or approved by the U.S. Food and Drug  Administration. The FDA has determined that such clearance or approval  is not necessary. This test is used for clinical purposes. It should not  be regarded as investigational or for research. This laboratory is  certified under the [place]  (CLIA-88) as qualified to perform high complexity clinical laboratory  testing. The controls stained appropriately.       Results Surgical pathology( [facility] [place]) (Order [id number])  MyChart Results Release  MyChart Status: Active Results Release    Surgical pathology( [place]) Order: [id number]  Status: Edited Result - FINAL    Visible to patient: Yes (seen)    Next appt: 08/14/2020 at 08:00 AM in [facility] Bernarda Caffey, MD)    Dx: Abnormal uterine bleeding (AUB)    0 Result Notes   Component 7 mo ago  SURGICAL PATHOLOGY SURGICAL PATHOLOGY  CASE: [id number]  PATIENT: Catrinia Kondracki  Surgical Pathology Report      Clinical History: AUB (cm)      FINAL MICROSCOPIC DIAGNOSIS:   A. ENDOMETRIUM, BIOPSY:  - Benign proliferative endometrium  - Negative for hyperplasia or malignancy     GROSS DESCRIPTION:   Received in formalin are tan-red to dark red soft tissue fragments that  are entirely submitted.  Volume: 2 x 2 x 0.4 cm. (2 B)   SW 12/28/2019      Final Diagnosis performed by Jaquita Folds, MD.  Electronically  signed 12/30/2019  Technical component performed at [facility]. [facility], [place] [address], [place], [place] [address].  Professional component performed at [facility],  [place] [address] [place], [place] [address].  Immunohistochemistry Technical component (if applicable) was performed  at [facility]. [address] [place],  [place], [place] [address].  IMMUNOHISTOCHEMISTRY DISCLAIMER (if applicable):  Some of these immunohistochemical stains may have been developed and the  performance characteristics determine by [facility]. Some  may not have been cleared or approved by the U.S. Food and Drug  Administration. The FDA has determined that such clearance or approval  is not necessary. This test is used for clinical purposes. It should not  be regarded as investigational or for research. This laboratory is  certified under the [place]  (CLIA-88)  as qualified to perform high complexity clinical laboratory  testing. The controls stained appropriately.   Resulting Agency [facility]         Assessment and Plan:     1. Abnormal uterine bleeding (AUB) --wants Endometrial Ablation - will schedule surgical consult  2. Diabetes 1.5, managed as type 2 ([place]) - not well controlled.  Managed by PCP.  3. Essential hypertension - not well controlled.  Managed by PCP.  4. Cardiovascular disease - clinically stable  5. H/O: stroke - clinically stable.  Followed by Cardiology  6. Chronic kidney disease, unspecified CKD stage - clinically stable.  Managed by PCP.  7. Obesity (BMI 30-39.9)  8. Tobacco dependence - cessation recommended with the aid of medication and behavioral modification     I discussed the assessment and treatment plan with the patient. The patient was  provided an opportunity to ask questions and all were answered. The patient agreed with the plan and demonstrated an understanding of the instructions.   The patient was advised to call back or seek an in-person evaluation/go to the ED if the symptoms worsen or if the condition fails to improve as anticipated.  I provided 15 minutes of face-to-face time during this encounter.   Baltazar Najjar, MD [facility] 07/29/20

## 2020-08-14 ENCOUNTER — Encounter (INDEPENDENT_AMBULATORY_CARE_PROVIDER_SITE_OTHER): Payer: Medicaid Other | Admitting: Ophthalmology

## 2020-08-25 ENCOUNTER — Ambulatory Visit: Payer: Medicaid Other | Admitting: Obstetrics & Gynecology

## 2020-08-28 ENCOUNTER — Other Ambulatory Visit: Payer: Self-pay

## 2020-08-28 ENCOUNTER — Ambulatory Visit (INDEPENDENT_AMBULATORY_CARE_PROVIDER_SITE_OTHER): Payer: Medicaid Other | Admitting: Obstetrics & Gynecology

## 2020-08-28 VITALS — BP 181/91 | HR 112 | Wt 243.5 lb

## 2020-08-28 DIAGNOSIS — N8003 Adenomyosis of the uterus: Secondary | ICD-10-CM

## 2020-08-28 DIAGNOSIS — N951 Menopausal and female climacteric states: Secondary | ICD-10-CM | POA: Diagnosis not present

## 2020-08-28 DIAGNOSIS — E139 Other specified diabetes mellitus without complications: Secondary | ICD-10-CM

## 2020-08-28 DIAGNOSIS — N939 Abnormal uterine and vaginal bleeding, unspecified: Secondary | ICD-10-CM

## 2020-08-28 DIAGNOSIS — N800 Endometriosis of uterus: Secondary | ICD-10-CM

## 2020-08-28 MED ORDER — ACCU-CHEK GUIDE VI STRP: 1.0000 | ORAL_STRIP | 2 refills | Status: DC

## 2020-08-28 NOTE — Progress Notes (Signed)
Patient ID: [name], female   DOB: May 29, 1971, 50 y.o.   MRN: [id number]  Chief Complaint  Patient presents with  . Consult    HPI [name] is a 50 y.o. female.  [id number] No LMP recorded. (Menstrual status: Irregular Periods). 03/2020, no menses since then. Last period was very heavy and 3 months later she had symptomatic anemia and was given transfusions. Feels well now. No VMS. Here to discuss management of her h/o menometrorrhagia. HPI  Past Medical History:  Diagnosis Date  . Arthritis   . Asthma   . Cataract    Mixed form OU  . CKD (chronic kidney disease)   . Coronary artery disease   . Diabetes mellitus   . Diabetic retinopathy ([place])    NPDR OU  . Hyperlipidemia   . Hypertension   . Hypertensive retinopathy    OU  . Left thyroid nodule    diagnosed 07/2018  . Pseudotumor cerebri   . Stroke ([place])   . Vitamin D deficiency     Past Surgical History:  Procedure Laterality Date  . ACHILLES TENDON REPAIR    . TUBAL LIGATION      Family History  Problem Relation Age of Onset  . Asthma Mother   . Hypertension Father   . Kidney disease Father   . Asthma Other   . Hyperlipidemia Other   . Hypertension Other   . Cancer Other   . Diabetes Maternal Grandmother   . Congestive Heart Failure Maternal Grandmother   . Hypertension Sister   . Asthma Sister   . Congestive Heart Failure Maternal Aunt     Social History Social History   Tobacco Use  . Smoking status: Former Smoker    Packs/day: 0.50    Years: 30.00    Pack years: 15.00    Types: Cigarettes    Quit date: 2017    Years since quitting: 5.0  . Smokeless tobacco: Never Used  Vaping Use  . Vaping Use: Never used  Substance Use Topics  . Alcohol use: Not Currently    Alcohol/week: 0.0 standard drinks    Comment: rare  . Drug use: Yes    Types: Marijuana    Comment: Daily    No Known Allergies  Current Outpatient Medications  Medication Sig Dispense Refill  . Accu-Chek FastClix  Lancets MISC 4 (four) times daily.    Marland Kitchen ACCU-CHEK GUIDE test strip 1 each by Other route See admin instructions. for testing 100 each 2  . albuterol (VENTOLIN HFA) 108 (90 Base) MCG/ACT inhaler Inhale into the lungs every 6 (six) hours as needed for wheezing or shortness of breath.    Marland Kitchen amLODipine-benazepril (LOTREL) 10-40 MG capsule Take 1 capsule by mouth daily. 90 capsule 2  . aspirin EC 81 MG EC tablet Take 1 tablet (81 mg total) by mouth daily.    . blood glucose meter kit and supplies KIT Dispense based on patient and insurance preference. Use up to four times daily as directed. (FOR ICD-9 250.00, 250.01). 1 each 0  . carvedilol (COREG) 25 MG tablet Take 1 tablet (25 mg total) by mouth 2 (two) times daily with a meal. 180 tablet 2  . Cholecalciferol (VITAMIN D3) 25 MCG (1000 UT) CAPS Take 1 capsule by mouth daily.    . ferrous sulfate (SLOW IRON) 160 (50 Fe) MG TBCR SR tablet Take 1 tablet (160 mg total) by mouth daily. 30 tablet 3  . folic acid (FOLVITE) 1 MG tablet Take  1 mg by mouth daily.    . furosemide (LASIX) 20 MG tablet Take 40 mg by mouth 2 (two) times daily.     Marland Kitchen glipiZIDE (GLUCOTROL) 10 MG tablet Take 10 mg by mouth daily.    . hydrOXYzine (VISTARIL) 25 MG capsule Take 25 mg by mouth daily as needed.    . insulin aspart protamine- aspart (NOVOLOG MIX 70/30) (70-30) 100 UNIT/ML injection Inject 0.15 mLs (15 Units total) into the skin 2 (two) times daily with a meal. (Patient taking differently: Inject 15-17 Units into the skin See admin instructions. Inject 15 units subcutaneous in the morning, then inject 17 units subcutaneous in the evening) 10 mL 11  . Insulin Syringes, Disposable, L-381 1 ML MISC 1 application by Does not apply route 2 (two) times a day. 100 each 0  . ipratropium-albuterol (DUONEB) 0.5-2.5 (3) MG/3ML SOLN Take 3 mLs by nebulization every 4 (four) hours as needed. 360 mL 0  . isosorbide-hydrALAZINE (BIDIL) 20-37.5 MG tablet Take 2 tablets by mouth 3 (three) times  daily. 180 tablet 3  . metFORMIN (GLUCOPHAGE) 1000 MG tablet Take 1,000 mg by mouth 2 (two) times daily.    . rosuvastatin (CRESTOR) 10 MG tablet TAKE 1 TABLET BY MOUTH EVERY DAY (Patient taking differently: Take 10 mg by mouth daily.) 90 tablet 3  . vitamin B-12 (CYANOCOBALAMIN) 1000 MCG tablet Take 1,000 mcg by mouth daily.     No current facility-administered medications for this visit.    Review of Systems Review of Systems  Constitutional: Negative.   Respiratory: Negative.   Cardiovascular: Negative.   Genitourinary: Negative for vaginal bleeding and vaginal discharge.    Blood pressure (!) 181/91, pulse (!) 112, weight 243 lb 8 oz (110.5 kg).  Physical Exam Physical Exam Vitals and nursing note reviewed.  Constitutional:      Appearance: Normal appearance.  Pulmonary:     Effort: Pulmonary effort is normal.  Abdominal:     General: There is no distension.  Skin:    General: Skin is warm and dry.     Coloration: Skin is not pale.  Neurological:     Mental Status: She is alert.  Psychiatric:        Mood and Affect: Mood normal.        Behavior: Behavior normal.     Data Reviewed CBC    Component Value Date/Time   WBC 11.5 (H) 06/25/2020 0245   RBC 3.66 (L) 06/25/2020 0245   HGB 7.7 (L) 06/25/2020 0245   HGB 8.4 (L) 03/30/2020 0900   HCT 26.6 (L) 06/25/2020 0245   HCT 26.5 (L) 03/30/2020 0900   PLT 249 06/25/2020 0245   PLT 300 03/30/2020 0900   MCV 72.7 (L) 06/25/2020 0245   MCV 87 03/30/2020 0900   MCH 21.0 (L) 06/25/2020 0245   MCHC 28.9 (L) 06/25/2020 0245   RDW 21.9 (H) 06/25/2020 0245   RDW 14.3 03/30/2020 0900   LYMPHSABS 2.2 02/14/2019 0551   MONOABS 0.7 02/14/2019 0551   EOSABS 0.3 02/14/2019 0551   BASOSABS 0.1 02/14/2019 0551   Narrative & Impression  CLINICAL DATA:  Abnormal uterine bleeding; LMP 11/23/2019  EXAM: TRANSABDOMINAL AND TRANSVAGINAL ULTRASOUND OF PELVIS  TECHNIQUE: Both transabdominal and transvaginal ultrasound  examinations of the pelvis were performed. Transabdominal technique was performed for global imaging of the pelvis including uterus, ovaries, adnexal regions, and pelvic cul-de-sac. It was necessary to proceed with endovaginal exam following the transabdominal exam to visualize the endometrium and LEFT ovary.  COMPARISON:  12/23/2019  FINDINGS: Uterus  Measurements: 9.0 x 5.2 x 6.1 cm = volume: 151 mL. Anteverted. Heterogeneous myometrium. Linear areas of shadowing. Cannot exclude adenomyosis. No focal uterine mass. Multiple nabothian cysts at cervix.  Endometrium  Thickness: 8 mm.  No endometrial fluid or focal abnormality.  Right ovary  Measurements: 3.5 x 2.8 x 3.8 cm = volume: 90.7 mL. Seen only on transabdominal imaging. Normal morphology without mass  Left ovary  Measurements: 4.1 x 2.3 x 3.0 cm = volume: 14.4 mL. Normal morphology without mass  Other findings  No free pelvic fluid or adnexal masses.  IMPRESSION: Heterogeneous myometrium with scattered areas of shadowing, cannot exclude adenomyosis.  Remainder of exam unremarkable.  Specifically, endometrial complex 8 mm thick, normal for a premenopausal patient; if bleeding remains unresponsive to hormonal or medical therapy, sonohysterogram should be considered for focal lesion work-up. (Ref: Radiological Reasoning: Algorithmic Workup of Abnormal Vaginal Bleeding with Endovaginal Sonography and Sonohysterography. AJR 2008; 592:N63-94)   Electronically Signed   By: Lavonia Dana M.D.   On: 04/06/2020 09:15      Assessment Diabetes 1.5, managed as type 2 ([place]) - Plan: ACCU-CHEK GUIDE test strip  Abnormal uterine bleeding (AUB) - Plan: [place]  Adenomyosis  Perimenopausal    Plan Orders Placed This Encounter  Procedures  . [place]  RTC 4 weeks. Patient is likely perimenopausal and possibly postmenopause. Will discuss whether she is likely to need any medical or surgical management for  menstrual difficulty at that time     Emeterio Reeve 08/28/2020, 11:44 AM

## 2020-08-28 NOTE — Patient Instructions (Signed)
[employer] Fast of Endocrinology (14th ed., pp. [phone]). [place], [place]: Elsevier.">  Perimenopause Perimenopause is the normal time of a woman's life when the levels of estrogen, the female hormone produced by the ovaries, begin to decrease. This leads to changes in menstrual periods before they stop completely (menopause). Perimenopause can begin 2-8 years before menopause. During perimenopause, the ovaries may or may not produce an egg and a woman can still become pregnant. What are the causes? This condition is caused by a natural change in hormone levels that happens as you get older. What increases the risk? This condition is more likely to start at an earlier age if you have certain medical conditions or have undergone treatments, including:  A tumor of the pituitary gland in the brain.  A disease that affects the ovaries and hormone production.  Certain cancer treatments, such as chemotherapy or hormone therapy, or radiation therapy on the pelvis.  Heavy smoking and excessive alcohol use.  Family history of early menopause. What are the signs or symptoms? Perimenopausal changes affect each woman differently. Symptoms of this condition may include:  Hot flashes.  Irregular menstrual periods.  Night sweats.  Changes in feelings about sex. This could be a decrease in sex drive or an increased discomfort around your sexuality.  Vaginal dryness.  Headaches.  Mood swings.  Depression.  Problems sleeping (insomnia).  Memory problems or trouble concentrating.  Irritability.  Tiredness.  Weight gain.  Anxiety.  Trouble getting pregnant. How is this diagnosed? This condition is diagnosed based on your medical history, a physical exam, your age, your menstrual history, and your symptoms. Hormone tests may also be done. How is this treated? In some cases, no treatment is needed. You and your health care provider should make a decision together about whether  treatment is necessary. Treatment will be based on your individual condition and preferences. Various treatments are available, such as:  Menopausal hormone therapy (MHT).  Medicines to treat specific symptoms.  Acupuncture.  Vitamin or herbal supplements. Before starting treatment, make sure to let your health care provider know if you have a personal or family history of:  Heart disease.  Breast cancer.  Blood clots.  Diabetes.  Osteoporosis. Follow these instructions at home: Medicines  Take over-the-counter and prescription medicines only as told by your health care provider.  Take vitamin supplements only as told by your health care provider.  Talk with your health care provider before starting any herbal supplements. Lifestyle  Do not use any products that contain nicotine or tobacco, such as cigarettes, e-cigarettes, and chewing tobacco. If you need help quitting, ask your health care provider.  Get at least 30 minutes of physical activity on 5 or more days each week.  Eat a balanced diet that includes fresh fruits and vegetables, whole grains, soybeans, eggs, lean meat, and low-fat dairy.  Avoid alcoholic and caffeinated beverages, as well as spicy foods. This may help prevent hot flashes.  Get 7-8 hours of sleep each night.  Dress in layers that can be removed to help you manage hot flashes.  Find ways to manage stress, such as deep breathing, meditation, or journaling.   General instructions  Keep track of your menstrual periods, including: ? When they occur. ? How heavy they are and how long they last. ? How much time passes between periods.  Keep track of your symptoms, noting when they start, how often you have them, and how long they last.  Use vaginal lubricants or moisturizers to help  with vaginal dryness and improve comfort during sex.  You can still become pregnant if you are having irregular periods. Make sure you use contraception during  perimenopause if you do not want to get pregnant.  Keep all follow-up visits. This is important. This includes any group therapy or counseling.   Contact a health care provider if:  You have heavy vaginal bleeding or pass blood clots.  Your period lasts more than 2 days longer than normal.  Your periods are recurring sooner than 21 days.  You bleed after having sex.  You have pain during sex. Get help right away if you have:  Chest pain, trouble breathing, or trouble talking.  Severe depression.  Pain when you urinate.  Severe headaches.  Vision problems. Summary  Perimenopause is the time when a woman's body begins to move into menopause. This may happen naturally or as a result of other health problems or medical treatments.  Perimenopause can begin 2-8 years before menopause, and it can last for several years.  Perimenopausal symptoms can be managed through medicines, lifestyle changes, and complementary therapies such as acupuncture. This information is not intended to replace advice given to you by your health care provider. Make sure you discuss any questions you have with your health care provider. Document Revised: 01/09/2020 Document Reviewed: 01/09/2020 Elsevier Patient Education  [place].

## 2020-08-28 NOTE — Progress Notes (Signed)
Pt is in office for consult for Ablation procedure.

## 2020-08-29 LAB — FOLLICLE STIMULATING HORMONE: FSH: 29.2 m[IU]/mL

## 2020-09-16 ENCOUNTER — Other Ambulatory Visit: Payer: Self-pay

## 2020-09-16 ENCOUNTER — Encounter: Payer: Self-pay | Admitting: Podiatry

## 2020-09-16 ENCOUNTER — Ambulatory Visit (INDEPENDENT_AMBULATORY_CARE_PROVIDER_SITE_OTHER): Payer: Medicaid Other | Admitting: Podiatry

## 2020-09-16 ENCOUNTER — Ambulatory Visit (INDEPENDENT_AMBULATORY_CARE_PROVIDER_SITE_OTHER): Payer: Medicaid Other

## 2020-09-16 DIAGNOSIS — L97512 Non-pressure chronic ulcer of other part of right foot with fat layer exposed: Secondary | ICD-10-CM | POA: Diagnosis not present

## 2020-09-16 DIAGNOSIS — Z794 Long term (current) use of insulin: Secondary | ICD-10-CM | POA: Diagnosis not present

## 2020-09-16 DIAGNOSIS — E1142 Type 2 diabetes mellitus with diabetic polyneuropathy: Secondary | ICD-10-CM | POA: Diagnosis not present

## 2020-09-16 NOTE — Progress Notes (Signed)
Subjective:  Patient ID: [name], female    DOB: 05/29/1971,  MRN: [id number]  Chief Complaint  Patient presents with  . Wound Check    Right hallux wound. PT stated that back in December she had cut her toe and it has not healed. She has bleeding and drainage     50 y.o. female presents for wound care.  Patient presents with right hallux wound.  Patient states that the wound opened up after she had a cut from a doorstep in December.  Patient states it bleeds and has some purulent drainage as she was able to express.  She would like to discuss treatment options for the wound.  She is not seeing anyone prior to seeing me.  She was not doing any current local wound care.  She has been wearing her regular shoes.  She has not been taking any antibiotics.   Review of Systems: Negative except as noted in the HPI. Denies N/V/F/Ch.  Past Medical History:  Diagnosis Date  . Arthritis   . Asthma   . Cataract    Mixed form OU  . CKD (chronic kidney disease)   . Coronary artery disease   . Diabetes mellitus   . Diabetic retinopathy ([place])    NPDR OU  . Hyperlipidemia   . Hypertension   . Hypertensive retinopathy    OU  . Left thyroid nodule    diagnosed 07/2018  . Pseudotumor cerebri   . Stroke ([place])   . Vitamin D deficiency     Current Outpatient Medications:  .  Accu-Chek FastClix Lancets MISC, 4 (four) times daily., Disp: , Rfl:  .  ACCU-CHEK GUIDE test strip, 1 each by Other route See admin instructions. for testing, Disp: 100 each, Rfl: 2 .  albuterol (VENTOLIN HFA) 108 (90 Base) MCG/ACT inhaler, Inhale into the lungs every 6 (six) hours as needed for wheezing or shortness of breath., Disp: , Rfl:  .  amLODipine-benazepril (LOTREL) 10-40 MG capsule, Take 1 capsule by mouth daily., Disp: 90 capsule, Rfl: 2 .  aspirin EC 81 MG EC tablet, Take 1 tablet (81 mg total) by mouth daily., Disp:  , Rfl:  .  blood glucose meter kit and supplies KIT, Dispense based on patient and  insurance preference. Use up to four times daily as directed. (FOR ICD-9 250.00, 250.01)., Disp: 1 each, Rfl: 0 .  carvedilol (COREG) 25 MG tablet, Take 1 tablet (25 mg total) by mouth 2 (two) times daily with a meal., Disp: 180 tablet, Rfl: 2 .  Cholecalciferol (VITAMIN D3) 25 MCG (1000 UT) CAPS, Take 1 capsule by mouth daily., Disp: , Rfl:  .  ferrous sulfate (SLOW IRON) 160 (50 Fe) MG TBCR SR tablet, Take 1 tablet (160 mg total) by mouth daily., Disp: 30 tablet, Rfl: 3 .  folic acid (FOLVITE) 1 MG tablet, Take 1 mg by mouth daily., Disp: , Rfl:  .  furosemide (LASIX) 20 MG tablet, Take 40 mg by mouth 2 (two) times daily. , Disp: , Rfl:  .  glipiZIDE (GLUCOTROL) 10 MG tablet, Take 10 mg by mouth daily., Disp: , Rfl:  .  hydrOXYzine (VISTARIL) 25 MG capsule, Take 25 mg by mouth daily as needed., Disp: , Rfl:  .  insulin aspart protamine- aspart (NOVOLOG MIX 70/30) (70-30) 100 UNIT/ML injection, Inject 0.15 mLs (15 Units total) into the skin 2 (two) times daily with a meal. (Patient taking differently: Inject 15-17 Units into the skin See admin instructions. Inject 15 units  subcutaneous in the morning, then inject 17 units subcutaneous in the evening), Disp: 10 mL, Rfl: 11 .  Insulin Syringes, Disposable, U-100 1 ML MISC, 1 application by Does not apply route 2 (two) times a day., Disp: 100 each, Rfl: 0 .  ipratropium-albuterol (DUONEB) 0.5-2.5 (3) MG/3ML SOLN, Take 3 mLs by nebulization every 4 (four) hours as needed., Disp: 360 mL, Rfl: 0 .  isosorbide-hydrALAZINE (BIDIL) 20-37.5 MG tablet, Take 2 tablets by mouth 3 (three) times daily., Disp: 180 tablet, Rfl: 3 .  metFORMIN (GLUCOPHAGE) 1000 MG tablet, Take 1,000 mg by mouth 2 (two) times daily., Disp: , Rfl:  .  rosuvastatin (CRESTOR) 10 MG tablet, TAKE 1 TABLET BY MOUTH EVERY DAY (Patient taking differently: Take 10 mg by mouth daily.), Disp: 90 tablet, Rfl: 3 .  vitamin B-12 (CYANOCOBALAMIN) 1000 MCG tablet, Take 1,000 mcg by mouth daily., Disp:  , Rfl:   Social History   Tobacco Use  Smoking Status Former Smoker  . Packs/day: 0.50  . Years: 30.00  . Pack years: 15.00  . Types: Cigarettes  . Quit date: 2017  . Years since quitting: 5.1  Smokeless Tobacco Never Used    No Known Allergies Objective:  There were no vitals filed for this visit. There is no height or weight on file to calculate BMI. Constitutional Well developed. Well nourished.  Vascular Dorsalis pedis pulses palpable bilaterally. Posterior tibial pulses palpable bilaterally. Capillary refill normal to all digits.  No cyanosis or clubbing noted. Pedal hair growth normal.  Neurologic Normal speech. Oriented to person, place, and time. Protective sensation absent  Dermatologic Wound Location: Right hallux wound with fat layer exposed Wound Base: Mixed Granular/Fibrotic Peri-wound: Calloused Exudate: Scant/small amount Serous exudate Wound Measurements: -See below  Orthopedic: No pain to palpation either foot.   Radiographs: None Assessment:   1. 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 Type 2 diabetes mellitus with diabetic polyneuropathy, with long-term current use of insulin ([place])    Plan:  Patient was evaluated and treated and all questions answered.  Ulcer right hallux with fat layer exposed -Debridement as below. -Dressed with Betadine wet-to-dry, DSD. -Continue off-loading with surgical shoe.  Procedure: Excisional Debridement of Wound Tool: Sharp chisel blade/tissue nipper Rationale: Removal of non-viable soft tissue from the wound to promote healing.  Anesthesia: none Pre-Debridement Wound Measurements: 1 cm x 0.5 cm x 0.3 cm  Post-Debridement Wound Measurements: 1.2 cm x 0.6 cm x 0.3 cm  Type of Debridement: Sharp Excisional Tissue Removed: Non-viable soft tissue Blood loss: Minimal (<50cc) Depth of Debridement: subcutaneous tissue. Technique: Sharp excisional debridement to bleeding, viable wound base.   Wound Progress: I will continue monitor the wound size this is my initial evaluation of the wound. Site healing conversation 7 Dressing: Dry, sterile, compression dressing. Disposition: Patient tolerated procedure well. Patient to return in 1 week for follow-up.  No follow-ups on file.

## 2020-09-25 NOTE — Progress Notes (Signed)
HPI 50 year old female smoker followed for OSA, complicated by arthritis, DM 2, HBP, pseudotumor cerebri NPSG 07/03/17- AHI 10.1/ hr, desaturation to 89%, body weight 212 lbs, + PLMA   ------------------------------------------------------------------------------------------------------  01/08/2018-50 year old female smoker followed for OSA, complicated by arthritis, DM 2, HBP, pseudotumor cerebri ----Cpap follow up- When she has worn the cpap she feels like she can not breath. CPAP auto 5-15/[facility] begun 08/25/2017. She had packed to move and CPAP machine is still packed up, but she didn't move .  She states she forgot what practice we are with so she did not call for advice. Didn't contact DME either.  She has not taken any of her medications in a couple of months-arrival BP today 200/120 is being reported to the patient's primary care office. She tells me CPAP was not putting out enough air so she felt smothered. She continues to smoke against advice.  09/28/20- 50 year old female Smoker followed for OSA, complicated by arthritis, DM 2, HTN, pseudotumor cerebri, CVA, CHF, CAD, NSVT, CKD, Obesity, Anemia,  CPAP auto 10-20/[facility] begun 08/25/2017. Download- compliance 77%, AHI 1.8/ hr Body weight today-249 lbs Covid vax-none declines Flu vax-none Hosp November, 2021 w dyspnea, anemia/ hgb 7.7, CHF Reviewed download. Sometimes orthopnea, has to prop up and take CPAP off, feeling smothered. Aware BP runs high. Pending iron infusion this month. Scant clear phlegm with little cough or wheeze.  CXR 06/24/20- IMPRESSION: Mild cardiomegaly and pulmonary vascular congestion. Patchy airspace opacity at the right lung base which could be due to atelectasis and/or infectious etiology.  ROS-see HPI   + = positive Constitutional:    weight loss, night sweats, fevers, chills,+ fatigue, lassitude. HEENT:    headaches, difficulty swallowing, tooth/dental problems, sore throat,       sneezing, itching,  ear ache, nasal congestion, post nasal drip, snoring CV:    chest pain, orthopnea, PND, swelling in lower extremities, anasarca,                                                  dizziness, palpitations Resp:   +shortness of breath with exertion or at rest.                +productive cough,   non-productive cough, coughing up of blood.              change in color of mucus.  wheezing.   Skin:    rash or lesions. GI:  No-   heartburn, indigestion, abdominal pain, nausea, vomiting, diarrhea,                 change in bowel habits, loss of appetite GU: dysuria, change in color of urine, no urgency or frequency.   flank pain. MS:   joint pain, stiffness, decreased range of motion, back pain. Neuro-     nothing unusual Psych:  change in mood or affect.  depression or anxiety.   memory loss.  OBJ- Physical Exam   BP 180/80 on arrival General- Alert, Oriented, Affect-appropriate, Distress- none acute, + obese Skin- rash-none, lesions- none, excoriation- none  Lymphadenopathy- none Head- atraumatic            Eyes- Gross vision intact, PERRLA, conjunctivae and secretions clear            Ears- Hearing, canals-normal            Nose-  Clear, no-Septal dev, mucus, polyps, erosion, perforation             Throat- Mallampati III , mucosa clear , drainage- none, tonsils- atrophic,  + tongue stud Neck- flexible , trachea midline, no stridor , thyroid nl, carotid no bruit Chest - symmetrical excursion , unlabored           Heart/CV- RRR , no murmur , no gallop  , no rub, nl s1 s2                           - JVD- none , edema- none, stasis changes- none, varices- none           Lung- clear to P&A, wheeze- none, cough- none , dullness-none, rub- none           Chest wall-  Abd-  Br/ Gen/ Rectal- Not done, not indicated Extrem- +walking shoe R foot (cut toe) Neuro- grossly intact to observation

## 2020-09-28 ENCOUNTER — Encounter: Payer: Self-pay | Admitting: Internal Medicine

## 2020-09-28 ENCOUNTER — Ambulatory Visit (INDEPENDENT_AMBULATORY_CARE_PROVIDER_SITE_OTHER): Payer: Medicaid Other

## 2020-09-28 ENCOUNTER — Ambulatory Visit (INDEPENDENT_AMBULATORY_CARE_PROVIDER_SITE_OTHER): Payer: Medicaid Other | Admitting: Internal Medicine

## 2020-09-28 ENCOUNTER — Other Ambulatory Visit: Payer: Self-pay

## 2020-09-28 ENCOUNTER — Telehealth: Payer: Self-pay | Admitting: Internal Medicine

## 2020-09-28 VITALS — BP 180/80 | HR 81 | Temp 98.2°F | Ht 69.0 in | Wt 249.0 lb

## 2020-09-28 DIAGNOSIS — I5032 Chronic diastolic (congestive) heart failure: Secondary | ICD-10-CM

## 2020-09-28 DIAGNOSIS — G4733 Obstructive sleep apnea (adult) (pediatric): Secondary | ICD-10-CM

## 2020-09-28 DIAGNOSIS — I10 Essential (primary) hypertension: Secondary | ICD-10-CM

## 2020-09-28 MED ORDER — DOXYCYCLINE HYCLATE 100 MG PO TABS: 100.0000 mg | ORAL_TABLET | Freq: Two times a day (BID) | ORAL | 0 refills | Status: DC

## 2020-09-28 NOTE — Progress Notes (Signed)
Done- see phone note dated 09/28/20

## 2020-09-28 NOTE — Assessment & Plan Note (Signed)
Benefits from CPAP and settings ok. She doessn't wear it at times that sound like intervals of CHF/ orthopnea Plan- continue auto 10-20

## 2020-09-28 NOTE — Assessment & Plan Note (Signed)
She describes orthopnea with smothering that prevents comfortable supine sleep and interferes with CPAP use. Plan- CXR, keep other appointments

## 2020-09-28 NOTE — Telephone Encounter (Signed)
Received call report from Opal Sidles with [facility] on patient's chest xray results done on 09/28/20. Dr. Annamaria Boots, please review the result/impression copied below:  IMPRESSION: 1. Airspace opacity consistent with pneumonia in a portion of the right middle lobe.  2. Cardiomegaly with a degree of pulmonary vascular congestion. No pulmonary edema evident.  Please advise, thank you.

## 2020-09-28 NOTE — Assessment & Plan Note (Signed)
She works with her nephrologist and primary physician, but arrived again today with BP up- 180/ 80. This, her anemia and her heart failure represent major health concerns.

## 2020-09-28 NOTE — Patient Instructions (Addendum)
We can continue CPAP auto 10-20 To get the high blood pressure It is ok to prop up so you can breathe at night if you need to.  But what is important is for you to work with your doctors to get the high blood pressure, anemia and heart failure under control so you can breathe better.   Order - CXR   Dx CHF

## 2020-09-28 NOTE — Telephone Encounter (Signed)
Teresa Lever, MD  P [facility] Triage Pool CXR- looks like an area of pneumonia in the right lung, and some fluid overload without heart failure   Order- please send doxycycline 100 mg, # 14, 1 twice daily    I called and spoke with the pt and notified of results and recommendations per Dr Annamaria Boots. She verbalized understanding. Rx for Doxy has been sent.

## 2020-09-29 ENCOUNTER — Ambulatory Visit: Payer: Medicaid Other | Admitting: Obstetrics & Gynecology

## 2020-09-30 ENCOUNTER — Other Ambulatory Visit (HOSPITAL_COMMUNITY): Payer: Self-pay

## 2020-09-30 NOTE — Discharge Instructions (Signed)
Ferumoxytol injection What is this medicine? FERUMOXYTOL is an iron complex. Iron is used to make healthy red blood cells, which carry oxygen and nutrients throughout the body. This medicine is used to treat iron deficiency anemia. This medicine may be used for other purposes; ask your health care provider or pharmacist if you have questions. COMMON BRAND NAME(S): Feraheme What should I tell my health care provider before I take this medicine? They need to know if you have any of these conditions:  anemia not caused by low iron levels  high levels of iron in the blood  magnetic resonance imaging (MRI) test scheduled  an unusual or allergic reaction to iron, other medicines, foods, dyes, or preservatives  pregnant or trying to get pregnant  breast-feeding How should I use this medicine? This medicine is for injection into a vein. It is given by a health care professional in a hospital or clinic setting. Talk to your pediatrician regarding the use of this medicine in children. Special care may be needed. Overdosage: If you think you have taken too much of this medicine contact a poison control center or emergency room at once. NOTE: This medicine is only for you. Do not share this medicine with others. What if I miss a dose? It is important not to miss your dose. Call your doctor or health care professional if you are unable to keep an appointment. What may interact with this medicine? This medicine may interact with the following medications:  other iron products This list may not describe all possible interactions. Give your health care provider a list of all the medicines, herbs, non-prescription drugs, or dietary supplements you use. Also tell them if you smoke, drink alcohol, or use illegal drugs. Some items may interact with your medicine. What should I watch for while using this medicine? Visit your doctor or healthcare professional regularly. Tell your doctor or healthcare  professional if your symptoms do not start to get better or if they get worse. You may need blood work done while you are taking this medicine. You may need to follow a special diet. Talk to your doctor. Foods that contain iron include: whole grains/cereals, dried fruits, beans, or peas, leafy green vegetables, and organ meats (liver, kidney). What side effects may I notice from receiving this medicine? Side effects that you should report to your doctor or health care professional as soon as possible:  allergic reactions like skin rash, itching or hives, swelling of the face, lips, or tongue  breathing problems  changes in blood pressure  feeling faint or lightheaded, falls  fever or chills  flushing, sweating, or hot feelings  swelling of the ankles or feet Side effects that usually do not require medical attention (report to your doctor or health care professional if they continue or are bothersome):  diarrhea  headache  nausea, vomiting  stomach pain This list may not describe all possible side effects. Call your doctor for medical advice about side effects. You may report side effects to FDA at 1-800-FDA-1088. Where should I keep my medicine? This drug is given in a hospital or clinic and will not be stored at home. NOTE: This sheet is a summary. It may not cover all possible information. If you have questions about this medicine, talk to your doctor, pharmacist, or health care provider.  2021 Elsevier/Gold Standard (2016-09-12 20:21:10)  

## 2020-10-01 ENCOUNTER — Other Ambulatory Visit: Payer: Self-pay

## 2020-10-01 ENCOUNTER — Encounter (HOSPITAL_COMMUNITY)
Admission: RE | Admit: 2020-10-01 | Discharge: 2020-10-01 | Disposition: A | Discharge status: Home / Self Care | Payer: Medicaid Other | Source: Ambulatory Visit | Attending: Nephrology | Admitting: Nephrology

## 2020-10-01 DIAGNOSIS — N189 Chronic kidney disease, unspecified: Secondary | ICD-10-CM | POA: Diagnosis not present

## 2020-10-01 DIAGNOSIS — D631 Anemia in chronic kidney disease: Secondary | ICD-10-CM | POA: Insufficient documentation

## 2020-10-01 MED ORDER — SODIUM CHLORIDE 0.9 % IV SOLN
510.0000 mg | INTRAVENOUS | Status: DC
  Administered 2020-10-01: 510 mg via INTRAVENOUS
  Filled 2020-10-01: qty 510

## 2020-10-08 ENCOUNTER — Other Ambulatory Visit: Payer: Self-pay

## 2020-10-08 ENCOUNTER — Encounter (HOSPITAL_COMMUNITY)
Admission: RE | Admit: 2020-10-08 | Discharge: 2020-10-08 | Disposition: A | Discharge status: Home / Self Care | Payer: Medicaid Other | Source: Ambulatory Visit | Attending: Nephrology | Admitting: Nephrology

## 2020-10-08 DIAGNOSIS — D631 Anemia in chronic kidney disease: Secondary | ICD-10-CM | POA: Insufficient documentation

## 2020-10-08 DIAGNOSIS — N189 Chronic kidney disease, unspecified: Secondary | ICD-10-CM | POA: Diagnosis not present

## 2020-10-08 MED ORDER — SODIUM CHLORIDE 0.9 % IV SOLN
510.0000 mg | INTRAVENOUS | Status: DC
  Administered 2020-10-08: 510 mg via INTRAVENOUS
  Filled 2020-10-08: qty 510

## 2020-10-09 ENCOUNTER — Ambulatory Visit: Payer: Medicaid Other | Admitting: Podiatry

## 2020-10-09 ENCOUNTER — Ambulatory Visit (INDEPENDENT_AMBULATORY_CARE_PROVIDER_SITE_OTHER): Payer: Medicaid Other

## 2020-10-09 ENCOUNTER — Other Ambulatory Visit: Payer: Self-pay

## 2020-10-09 DIAGNOSIS — L97512 Non-pressure chronic ulcer of other part of right foot with fat layer exposed: Secondary | ICD-10-CM

## 2020-10-09 DIAGNOSIS — Z794 Long term (current) use of insulin: Secondary | ICD-10-CM

## 2020-10-09 DIAGNOSIS — E1142 Type 2 diabetes mellitus with diabetic polyneuropathy: Secondary | ICD-10-CM

## 2020-10-12 ENCOUNTER — Ambulatory Visit: Payer: Medicaid Other | Admitting: Obstetrics & Gynecology

## 2020-10-13 ENCOUNTER — Encounter: Payer: Self-pay | Admitting: Podiatry

## 2020-10-13 NOTE — Progress Notes (Signed)
Subjective:  Patient ID: [name], female    DOB: Feb 07, 1971,  MRN: [id number]  Chief Complaint  Patient presents with  . Wound Check    PT stated that she feels like she has an odor coming from her toe    50 y.o. female presents for wound care.  Patient presents with a follow-up of right hallux wound.  Patient states symptoms about the same.  And has progressive gotten worse.  There are some odor to it.  She denies any other acute complaints.  She has been taking antibiotics.   Review of Systems: Negative except as noted in the HPI. Denies N/V/F/Ch.  Past Medical History:  Diagnosis Date  . Arthritis   . Asthma   . Cataract    Mixed form OU  . CKD (chronic kidney disease)   . Coronary artery disease   . Diabetes mellitus   . Diabetic retinopathy ([place])    NPDR OU  . Hyperlipidemia   . Hypertension   . Hypertensive retinopathy    OU  . Left thyroid nodule    diagnosed 07/2018  . Pseudotumor cerebri   . Stroke ([place])   . Vitamin D deficiency     Current Outpatient Medications:  .  Accu-Chek FastClix Lancets MISC, 4 (four) times daily., Disp: , Rfl:  .  ACCU-CHEK GUIDE test strip, 1 each by Other route See admin instructions. for testing, Disp: 100 each, Rfl: 2 .  albuterol (VENTOLIN HFA) 108 (90 Base) MCG/ACT inhaler, Inhale into the lungs every 6 (six) hours as needed for wheezing or shortness of breath., Disp: , Rfl:  .  amLODipine-benazepril (LOTREL) 10-40 MG capsule, Take 1 capsule by mouth daily., Disp: 90 capsule, Rfl: 2 .  aspirin EC 81 MG EC tablet, Take 1 tablet (81 mg total) by mouth daily., Disp:  , Rfl:  .  blood glucose meter kit and supplies KIT, Dispense based on patient and insurance preference. Use up to four times daily as directed. (FOR ICD-9 250.00, 250.01)., Disp: 1 each, Rfl: 0 .  carvedilol (COREG) 25 MG tablet, Take 1 tablet (25 mg total) by mouth 2 (two) times daily with a meal., Disp: 180 tablet, Rfl: 2 .  Cholecalciferol (VITAMIN D3) 25 MCG  (1000 UT) CAPS, Take 1 capsule by mouth daily., Disp: , Rfl:  .  doxycycline (VIBRA-TABS) 100 MG tablet, Take 1 tablet (100 mg total) by mouth 2 (two) times daily., Disp: 14 tablet, Rfl: 0 .  ferrous sulfate (SLOW IRON) 160 (50 Fe) MG TBCR SR tablet, Take 1 tablet (160 mg total) by mouth daily., Disp: 30 tablet, Rfl: 3 .  folic acid (FOLVITE) 1 MG tablet, Take 1 mg by mouth daily., Disp: , Rfl:  .  furosemide (LASIX) 20 MG tablet, Take 40 mg by mouth 2 (two) times daily. , Disp: , Rfl:  .  glipiZIDE (GLUCOTROL) 10 MG tablet, Take 10 mg by mouth daily., Disp: , Rfl:  .  hydrOXYzine (VISTARIL) 25 MG capsule, Take 25 mg by mouth daily as needed., Disp: , Rfl:  .  insulin aspart protamine- aspart (NOVOLOG MIX 70/30) (70-30) 100 UNIT/ML injection, Inject 0.15 mLs (15 Units total) into the skin 2 (two) times daily with a meal. (Patient taking differently: Inject 15-17 Units into the skin See admin instructions. Inject 15 units subcutaneous in the morning, then inject 17 units subcutaneous in the evening), Disp: 10 mL, Rfl: 11 .  Insulin Syringes, Disposable, U-100 1 ML MISC, 1 application by Does not apply  route 2 (two) times a day., Disp: 100 each, Rfl: 0 .  ipratropium-albuterol (DUONEB) 0.5-2.5 (3) MG/3ML SOLN, Take 3 mLs by nebulization every 4 (four) hours as needed., Disp: 360 mL, Rfl: 0 .  isosorbide-hydrALAZINE (BIDIL) 20-37.5 MG tablet, Take 2 tablets by mouth 3 (three) times daily., Disp: 180 tablet, Rfl: 3 .  metFORMIN (GLUCOPHAGE) 1000 MG tablet, Take 1,000 mg by mouth 2 (two) times daily., Disp: , Rfl:  .  rosuvastatin (CRESTOR) 10 MG tablet, TAKE 1 TABLET BY MOUTH EVERY DAY (Patient taking differently: Take 10 mg by mouth daily.), Disp: 90 tablet, Rfl: 3 .  vitamin B-12 (CYANOCOBALAMIN) 1000 MCG tablet, Take 1,000 mcg by mouth daily., Disp: , Rfl:   Social History   Tobacco Use  Smoking Status Former Smoker  . Packs/day: 0.50  . Years: 30.00  . Pack years: 15.00  . Types: Cigarettes  .  Quit date: 2017  . Years since quitting: 5.1  Smokeless Tobacco Never Used    No Known Allergies Objective:  There were no vitals filed for this visit. There is no height or weight on file to calculate BMI. Constitutional Well developed. Well nourished.  Vascular Dorsalis pedis pulses palpable bilaterally. Posterior tibial pulses palpable bilaterally. Capillary refill normal to all digits.  No cyanosis or clubbing noted. Pedal hair growth normal.  Neurologic Normal speech. Oriented to person, place, and time. Protective sensation absent  Dermatologic Wound Location: Right hallux wound with fat layer exposed Wound Base: Mixed Granular/Fibrotic Peri-wound: Calloused Exudate: Scant/small amount Serous exudate Wound Measurements: -See below  Orthopedic: No pain to palpation either foot.   Radiographs:3 views of skeletally mature adult right foot: No cortical destruction irregularity noted.  No signs of osteomyelitis noted.  No other bony abnormalities identified.  No soft tissue emphysema noted.  Soft tissue defect noted at the hallux. Assessment:   1. 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  Ulcer right hallux with fat layer exposed -Debridement as below. -Dressed with Betadine wet-to-dry, DSD. -Continue off-loading with surgical shoe. -Given the stagnation of the wound I believe patient will benefit from advanced wound care.  She will be seen and referred over to the wound care center for further management.  Procedure: Excisional Debridement of Wound Tool: Sharp chisel blade/tissue nipper Rationale: Removal of non-viable soft tissue from the wound to promote healing.  Anesthesia: none Pre-Debridement Wound Measurements: 1 cm x 0.5 cm x 0.3 cm  Post-Debridement Wound Measurements: 1.2 cm x 0.6 cm x 0.3 cm  Type of Debridement: Sharp Excisional Tissue Removed: Non-viable soft  tissue Blood loss: Minimal (<50cc) Depth of Debridement: subcutaneous tissue. Technique: Sharp excisional debridement to bleeding, viable wound base.  Wound Progress: I will continue monitor the wound size this is my initial evaluation of the wound. Site healing conversation 7 Dressing: Dry, sterile, compression dressing. Disposition: Patient tolerated procedure well. Patient to return in 1 week for follow-up.  No follow-ups on file.

## 2020-10-13 NOTE — Progress Notes (Signed)
Patient referred by [name], MD for hypertensive heart disease  Subjective:   [name], female    DOB: [birth year], 50 y.o.   MRN: [id number]   Chief Complaint  Patient presents with  . Hypertension  . Follow-up    3 Month    50 y.o. African-American female with hypertension, uncontrolled type 2 diabetes mellitus, hyperlipidemia, tobacco abuse, recurrent strokes.  Patient reports compliance. Blood pressure remains elevated. She denies chest pain, shortness of breath, palpitations, leg edema, orthopnea, PND, TIA/syncope.   Current Outpatient Medications on File Prior to Visit  Medication Sig Dispense Refill  . Accu-Chek FastClix Lancets MISC 4 (four) times daily.    Marland Kitchen ACCU-CHEK GUIDE test strip 1 each by Other route See admin instructions. for testing 100 each 2  . albuterol (VENTOLIN HFA) 108 (90 Base) MCG/ACT inhaler Inhale into the lungs every 6 (six) hours as needed for wheezing or shortness of breath.    Marland Kitchen amLODipine-benazepril (LOTREL) 10-40 MG capsule Take 1 capsule by mouth daily. 90 capsule 2  . aspirin EC 81 MG EC tablet Take 1 tablet (81 mg total) by mouth daily.    . blood glucose meter kit and supplies KIT Dispense based on patient and insurance preference. Use up to four times daily as directed. (FOR ICD-9 250.00, 250.01). 1 each 0  . carvedilol (COREG) 25 MG tablet Take 1 tablet (25 mg total) by mouth 2 (two) times daily with a meal. 180 tablet 2  . Cholecalciferol (VITAMIN D3) 25 MCG (1000 UT) CAPS Take 1 capsule by mouth daily.    . folic acid (FOLVITE) 1 MG tablet Take 1 mg by mouth daily.    . furosemide (LASIX) 20 MG tablet Take 40 mg by mouth 2 (two) times daily.     Marland Kitchen glipiZIDE (GLUCOTROL) 10 MG tablet Take 10 mg by mouth daily.    . hydrOXYzine (VISTARIL) 25 MG capsule Take 25 mg by mouth daily as needed.    . insulin aspart protamine- aspart (NOVOLOG MIX 70/30) (70-30) 100 UNIT/ML injection Inject 0.15 mLs (15 Units total) into the skin 2  (two) times daily with a meal. (Patient taking differently: Inject 15-17 Units into the skin See admin instructions. Inject 15 units subcutaneous in the morning, then inject 17 units subcutaneous in the evening) 10 mL 11  . Insulin Syringes, Disposable, V-035 1 ML MISC 1 application by Does not apply route 2 (two) times a day. 100 each 0  . ipratropium-albuterol (DUONEB) 0.5-2.5 (3) MG/3ML SOLN Take 3 mLs by nebulization every 4 (four) hours as needed. 360 mL 0  . isosorbide-hydrALAZINE (BIDIL) 20-37.5 MG tablet Take 2 tablets by mouth 3 (three) times daily. 180 tablet 3  . metFORMIN (GLUCOPHAGE) 1000 MG tablet Take 1,000 mg by mouth 2 (two) times daily.    . rosuvastatin (CRESTOR) 10 MG tablet TAKE 1 TABLET BY MOUTH EVERY DAY (Patient taking differently: Take 10 mg by mouth daily.) 90 tablet 3  . vitamin B-12 (CYANOCOBALAMIN) 1000 MCG tablet Take 1,000 mcg by mouth daily.     No current facility-administered medications on file prior to visit.    Cardiovascular studies:  EKG 06/24/2020: Sinus rhythm 79 bpm. Left atrial enlargement.  Nonspecific T-abnormality.   Echocardiogram 06/25/2020:  1. Left ventricular ejection fraction, by estimation, is 60 to 65%. The  left ventricle has normal function. The left ventricle has no regional  wall motion abnormalities. There is severe left ventricular hypertrophy.  Left ventricular diastolic parameters  were  normal.  2. Right ventricular systolic function is normal. The right ventricular  size is normal. There is normal pulmonary artery systolic pressure. The  estimated right ventricular systolic pressure is 05.3 mmHg.  3. A small pericardial effusion is present.  4. The mitral valve is normal in structure. No evidence of mitral valve  regurgitation.  5. The aortic valve was not well visualized. Aortic valve regurgitation  is mild. No aortic stenosis is present.  6. The inferior vena cava is dilated in size with >50% respiratory   variability, suggesting right atrial pressure of 8 mmHg.   Event monitor 09/17/2019 - 10/16/2019: Diagnostic time: 94%  Dominant rhythm: Sinus. HR 66-112 bpm. Avg HR 80 bpm. 6 episodes of NSVT up to 9 beats seen.  No atrial fibrillation/atrial flutter/SVT/high grade AV block, sinus pause >3sec noted. Symptoms reported: None  MRI brain 08/20/2019: - Right frontal parasagittal and anterior corpus callosum lesion which is hyperintense on DWI, slightly hypointense on ADC map, hyperintense on T2 and FLAIR views, with heterogeneous enhancement.  Considerations could include subacute infarction, autoimmune, inflammatory or neoplastic etiologies. This is a new finding compared to 02/11/2019. - Mild scattered periventricular, subcortical, pontine chronic small vessel ischemic disease.  CTA head/neck 02/12/2019: 1. Diffuse distal branch vessel irregularity throughout the circle-of-Willis without a significant proximal stenosis or occlusion. 2. Atherosclerotic changes are evident within the cavernous internal carotid arteries and right greater than left M1 segments with diffuse vessel irregularity and mild narrowing of less than 50% relative to the more distal vessels. 3. Tortuosity of the cervical internal carotid arteries and vertebral arteries bilaterally without significant stenosis. This is nonspecific, but often seen in the setting of chronic hypertension. 4. Brainstem infarct and white matter parenchymal changes noted on MRI are not discernible by CT.  MRI brain 02/11/2019: 1. Patchy small volume acute ischemic nonhemorrhagic infarct involving the left lateral midbrain/pons. 2. Underlying mild chronic microvascular ischemic disease.   Recent labs: 06/25/2020: Glucose 136, BUN/Cr 18/1.49. EGFR 43. Na/K 139/3.5.  H/H 7.7/26.6. MCV 72.7. Platelets 249 Results for [name], [name] (MRN [id number]) as of 07/15/2020 16:09  Ref. Range 06/24/2020 21:30 06/24/2020 23:14  B Natriuretic Peptide Latest  Ref Range: 0.0 - 100.0 pg/mL 427.9 (H)   Troponin I (High Sensitivity) Latest Ref Range: <18 ng/L 17 18 (H)     11/28/2019: Glucose 177, BUN/Cr 33/2.33. EGFR 28. Na/K 138/4.0. Rest of the CMP normal H/H 11/32. MCV 86. Platelets 254 Chol 167, TG 287, HDL 39, LDL 81  03/12/2019: Glucose 170.  BUN/creatinine 26/1.3.  EGFR 57.  NA/K1 39/4.2. H/H 12.6/37.5.  MCV 91.  Platelets 245.  Normal iron studies. Hemoglobin A1c 8.5%. Cholesterol 106, triglycerides 103, HDL 38, LDL 47   Review of Systems  Cardiovascular: Positive for dyspnea on exertion. Negative for chest pain, leg swelling, palpitations and syncope.  Genitourinary: Negative for dysuria.  Neurological: Positive for numbness.       Foot drop  All other systems reviewed and are negative.        Vitals:   10/14/20 0928 10/14/20 0933  BP: (!) 174/90 (!) 173/90  Pulse: 92 88  Temp: 98 F (36.7 C)   SpO2: 96%      Objective:   Physical Exam Vitals and nursing note reviewed.  Constitutional:      General: She is not in acute distress. HENT:     Head: Normocephalic and atraumatic.  Neck:     Vascular: No JVD.  Cardiovascular:     Rate and Rhythm:  Normal rate and regular rhythm.     Pulses: Intact distal pulses.     Heart sounds: Murmur heard.   Early systolic murmur is present with a grade of 2/6 at the lower right sternal border.   Pulmonary:     Effort: Pulmonary effort is normal.     Breath sounds: Normal breath sounds. No rales.  Neurological:     Cranial Nerves: No cranial nerve deficit.           Assessment & Recommendations:   50 y.o. 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  Resistant hypertension: Will try pill pack to ensure compliance. Recommend regular home monitoring.  Continue current antihypertensive therapy  History of stroke: No Afib on  even r monitor.  Continue Aspirin, statin.  Type 2 diabetes mellitus: Uncontrolled.  Management as per PCP.  F/u in 6 weeks  Chelsey Redondo Esther Hardy, MD [facility] Pager: [phone] Office: [phone] If no answer Cell [phone]

## 2020-10-14 ENCOUNTER — Encounter: Payer: Self-pay | Admitting: Cardiology

## 2020-10-14 ENCOUNTER — Ambulatory Visit: Payer: Medicaid Other | Admitting: Cardiology

## 2020-10-14 ENCOUNTER — Other Ambulatory Visit: Payer: Self-pay | Admitting: Pharmacist

## 2020-10-14 ENCOUNTER — Other Ambulatory Visit: Payer: Self-pay

## 2020-10-14 VITALS — BP 173/90 | HR 88 | Temp 98.0°F | Ht 69.0 in | Wt 245.0 lb

## 2020-10-14 DIAGNOSIS — I5032 Chronic diastolic (congestive) heart failure: Secondary | ICD-10-CM

## 2020-10-14 DIAGNOSIS — I4729 Other ventricular tachycardia: Secondary | ICD-10-CM

## 2020-10-14 DIAGNOSIS — E782 Mixed hyperlipidemia: Secondary | ICD-10-CM

## 2020-10-14 DIAGNOSIS — I472 Ventricular tachycardia: Secondary | ICD-10-CM

## 2020-10-14 DIAGNOSIS — I639 Cerebral infarction, unspecified: Secondary | ICD-10-CM

## 2020-10-14 DIAGNOSIS — I10 Essential (primary) hypertension: Secondary | ICD-10-CM

## 2020-10-14 DIAGNOSIS — Z8673 Personal history of transient ischemic attack (TIA), and cerebral infarction without residual deficits: Secondary | ICD-10-CM

## 2020-10-14 MED ORDER — AMLODIPINE BESY-BENAZEPRIL HCL 10-40 MG PO CAPS: 1.0000 | ORAL_CAPSULE | Freq: Every day | ORAL | 2 refills | Status: DC

## 2020-10-14 MED ORDER — CARVEDILOL 25 MG PO TABS: 25.0000 mg | ORAL_TABLET | Freq: Two times a day (BID) | ORAL | 2 refills | Status: DC

## 2020-10-14 MED ORDER — FUROSEMIDE 20 MG PO TABS: 40.0000 mg | ORAL_TABLET | Freq: Two times a day (BID) | ORAL | 3 refills | Status: DC

## 2020-10-14 MED ORDER — BIDIL 20-37.5 MG PO TABS: 2.0000 | ORAL_TABLET | Freq: Three times a day (TID) | ORAL | 3 refills | Status: DC

## 2020-10-14 MED ORDER — ROSUVASTATIN CALCIUM 10 MG PO TABS: 10.0000 mg | ORAL_TABLET | Freq: Every day | ORAL | 3 refills | Status: DC

## 2020-10-14 MED ORDER — ASPIRIN 81 MG PO TBEC: 81.0000 mg | DELAYED_RELEASE_TABLET | Freq: Every day | ORAL | 2 refills | Status: DC

## 2020-10-23 ENCOUNTER — Ambulatory Visit: Payer: Medicaid Other | Admitting: Cardiology

## 2020-10-30 ENCOUNTER — Other Ambulatory Visit: Payer: Self-pay

## 2020-10-30 ENCOUNTER — Ambulatory Visit: Payer: Medicaid Other | Admitting: Podiatry

## 2020-10-30 DIAGNOSIS — Z794 Long term (current) use of insulin: Secondary | ICD-10-CM

## 2020-10-30 DIAGNOSIS — E1142 Type 2 diabetes mellitus with diabetic polyneuropathy: Secondary | ICD-10-CM | POA: Diagnosis not present

## 2020-10-30 DIAGNOSIS — L97512 Non-pressure chronic ulcer of other part of right foot with fat layer exposed: Secondary | ICD-10-CM | POA: Diagnosis not present

## 2020-11-03 ENCOUNTER — Encounter: Payer: Self-pay | Admitting: Obstetrics & Gynecology

## 2020-11-03 ENCOUNTER — Encounter: Payer: Self-pay | Admitting: Podiatry

## 2020-11-03 ENCOUNTER — Telehealth (INDEPENDENT_AMBULATORY_CARE_PROVIDER_SITE_OTHER): Payer: Medicaid Other | Admitting: Obstetrics & Gynecology

## 2020-11-03 VITALS — BP 140/79 | HR 84 | Ht 69.0 in | Wt 250.0 lb

## 2020-11-03 DIAGNOSIS — Z780 Asymptomatic menopausal state: Secondary | ICD-10-CM

## 2020-11-03 NOTE — Patient Instructions (Signed)
https://www.womenshealth.gov/menopause/menopause-basics"> https://www.clinicalkey.com">  Menopause Menopause is the normal time of a woman's life when menstrual periods stop completely. It marks the natural end to a woman's ability to become pregnant. It can be defined as the absence of a menstrual period for 12 months without another medical cause. The transition to menopause (perimenopause) most often happens between the ages of 45 and 55, and can last for many years. During perimenopause, hormone levels change in your body, which can cause symptoms and affect your health. Menopause may increase your risk for:  Weakened bones (osteoporosis), which causes fractures.  Depression.  Hardening and narrowing of the arteries (atherosclerosis), which can cause heart attacks and strokes. What are the causes? This condition is usually caused by a natural change in hormone levels that happens as you get older. The condition may also be caused by changes that are not natural, including:  Surgery to remove both ovaries (surgical menopause).  Side effects from some medicines, such as chemotherapy used to treat cancer (chemical menopause). What increases the risk? This condition is more likely to start at an earlier age if you have certain medical conditions or have undergone treatments, including:  A tumor of the pituitary gland in the brain.  A disease that affects the ovaries and hormones.  Certain cancer treatments, such as chemotherapy or hormone therapy, or radiation therapy on the pelvis.  Heavy smoking and excessive alcohol use.  Family history of early menopause. This condition is also more likely to develop earlier in women who are very thin. What are the signs or symptoms? Symptoms of this condition include:  Hot flashes.  Irregular menstrual periods.  Night sweats.  Changes in feelings about sex. This could be a decrease in sex drive or an increased discomfort around your  sexuality.  Vaginal dryness and thinning of the vaginal walls. This may cause painful sex.  Dryness of the skin and development of wrinkles.  Headaches.  Problems sleeping (insomnia).  Mood swings or irritability.  Memory problems.  Weight gain.  Hair growth on the face and chest.  Bladder infections or problems with urinating. How is this diagnosed? This condition is diagnosed based on your medical history, a physical exam, your age, your menstrual history, and your symptoms. Hormone tests may also be done. How is this treated? In some cases, no treatment is needed. You and your health care provider should make a decision together about whether treatment is necessary. Treatment will be based on your individual condition and preferences. Treatment for this condition focuses on managing symptoms. Treatment may include:  Menopausal hormone therapy (MHT).  Medicines to treat specific symptoms or complications.  Acupuncture.  Vitamin or herbal supplements. Before starting treatment, make sure to let your health care provider know if you have a personal or family history of these conditions:  Heart disease.  Breast cancer.  Blood clots.  Diabetes.  Osteoporosis. Follow these instructions at home: Lifestyle  Do not use any products that contain nicotine or tobacco, such as cigarettes, e-cigarettes, and chewing tobacco. If you need help quitting, ask your health care provider.  Get at least 30 minutes of physical activity on 5 or more days each week.  Avoid alcoholic and caffeinated beverages, as well as spicy foods. This may help prevent hot flashes.  Get 7-8 hours of sleep each night.  If you have hot flashes, try: ? Dressing in layers. ? Avoiding things that may trigger hot flashes, such as spicy food, warm places, or stress. ? Taking slow, deep   breaths when a hot flash starts. ? Keeping a fan in your home and office.  Find ways to manage stress, such as deep  breathing, meditation, or journaling.  Consider going to group therapy with other women who are having menopause symptoms. Ask your health care provider about recommended group therapy meetings. Eating and drinking  Eat a healthy, balanced diet that contains whole grains, lean protein, low-fat dairy, and plenty of fruits and vegetables.  Your health care provider may recommend adding more soy to your diet. Foods that contain soy include tofu, tempeh, and soy milk.  Eat plenty of foods that contain calcium and vitamin D for bone health. Items that are rich in calcium include low-fat milk, yogurt, beans, almonds, sardines, broccoli, and kale.   Medicines  Take over-the-counter and prescription medicines only as told by your health care provider.  Talk with your health care provider before starting any herbal supplements. If prescribed, take vitamins and supplements as told by your health care provider. General instructions  Keep track of your menstrual periods, including: ? When they occur. ? How heavy they are and how long they last. ? How much time passes between periods.  Keep track of your symptoms, noting when they start, how often you have them, and how long they last.  Use vaginal lubricants or moisturizers to help with vaginal dryness and improve comfort during sex.  Keep all follow-up visits. This is important. This includes any group therapy or counseling.   Contact a health care provider if:  You are still having menstrual periods after age 55.  You have pain during sex.  You have not had a period for 12 months and you develop vaginal bleeding. Get help right away if you have:  Severe depression.  Excessive vaginal bleeding.  Pain when you urinate.  A fast or irregular heartbeat (palpitations).  Severe headaches.  Abdominal pain or severe indigestion. Summary  Menopause is a normal time of life when menstrual periods stop completely. It is usually defined as  the absence of a menstrual period for 12 months without another medical cause.  The transition to menopause (perimenopause) most often happens between the ages of 45 and 55 and can last for several years.  Symptoms can be managed through medicines, lifestyle changes, and complementary therapies such as acupuncture.  Eat a balanced diet that is rich in nutrients to promote bone health and heart health and to manage symptoms during menopause. This information is not intended to replace advice given to you by your health care provider. Make sure you discuss any questions you have with your health care provider. Document Revised: 04/24/2020 Document Reviewed: 01/09/2020 Elsevier Patient Education  2021 Elsevier Inc.  

## 2020-11-03 NOTE — Progress Notes (Signed)
Pt reached by telephone and identified by name and d.o.b. Pt is f/u today for management of menopausal symptoms.

## 2020-11-03 NOTE — Progress Notes (Signed)
Subjective:  Patient ID: [name], female    DOB: Nov 16, 1970,  MRN: [id number]  Chief Complaint  Patient presents with  . Wound Check    PT stated that she is doing well     50 y.o. female presents for wound care.  Patient presents with a follow-up of right hallux wound.  Patient states the wound is about the same she still has some pain.  There is no odor to it.  She has been doing Betadine wet-to-dry dressing changes.  She has an appointment made with the wound care center for April 14.  I encouraged keeping the appointment.  She states understanding   Review of Systems: Negative except as noted in the HPI. Denies N/V/F/Ch.  Past Medical History:  Diagnosis Date  . Arthritis   . Asthma   . Cataract    Mixed form OU  . CKD (chronic kidney disease)   . Coronary artery disease   . Diabetes mellitus   . Diabetic retinopathy (HCC)    NPDR OU  . Hyperlipidemia   . Hypertension   . Hypertensive retinopathy    OU  . Left thyroid nodule    diagnosed 07/2018  . Pseudotumor cerebri   . Stroke (HCC)   . Vitamin D deficiency     Current Outpatient Medications:  .  Accu-Chek FastClix Lancets MISC, 4 (four) times daily., Disp: , Rfl:  .  ACCU-CHEK GUIDE test strip, 1 each by Other route See admin instructions. for testing, Disp: 100 each, Rfl: 2 .  albuterol (VENTOLIN HFA) 108 (90 Base) MCG/ACT inhaler, Inhale into the lungs every 6 (six) hours as needed for wheezing or shortness of breath., Disp: , Rfl:  .  amLODipine-benazepril (LOTREL) 10-40 MG capsule, Take 1 capsule by mouth daily., Disp: 90 capsule, Rfl: 2 .  aspirin 81 MG EC tablet, Take 1 tablet (81 mg total) by mouth daily., Disp: 90 tablet, Rfl: 2 .  blood glucose meter kit and supplies KIT, Dispense based on patient and insurance preference. Use up to four times daily as directed. (FOR ICD-9 250.00, 250.01)., Disp: 1 each, Rfl: 0 .  carvedilol (COREG) 25 MG tablet, Take 1 tablet (25 mg total) by mouth 2 (two) times  daily with a meal., Disp: 180 tablet, Rfl: 2 .  Cholecalciferol (VITAMIN D3) 25 MCG (1000 UT) CAPS, Take 1 capsule by mouth daily., Disp: , Rfl:  .  folic acid (FOLVITE) 1 MG tablet, Take 1 mg by mouth daily., Disp: , Rfl:  .  furosemide (LASIX) 20 MG tablet, Take 2 tablets (40 mg total) by mouth 2 (two) times daily. Take in the morning and afternoon, Disp: 90 tablet, Rfl: 3 .  glipiZIDE (GLUCOTROL) 10 MG tablet, Take 10 mg by mouth daily., Disp: , Rfl:  .  hydrOXYzine (VISTARIL) 25 MG capsule, Take 25 mg by mouth daily as needed., Disp: , Rfl:  .  insulin aspart protamine- aspart (NOVOLOG MIX 70/30) (70-30) 100 UNIT/ML injection, Inject 0.15 mLs (15 Units total) into the skin 2 (two) times daily with a meal. (Patient taking differently: Inject 15-17 Units into the skin See admin instructions. Inject 15 units subcutaneous in the morning, then inject 17 units subcutaneous in the evening), Disp: 10 mL, Rfl: 11 .  Insulin Syringes, Disposable, U-100 1 ML MISC, 1 application by Does not apply route 2 (two) times a day., Disp: 100 each, Rfl: 0 .  ipratropium-albuterol (DUONEB) 0.5-2.5 (3) MG/3ML SOLN, Take 3 mLs by nebulization every 4 (four) hours  as needed., Disp: 360 mL, Rfl: 0 .  isosorbide-hydrALAZINE (BIDIL) 20-37.5 MG tablet, Take 2 tablets by mouth 3 (three) times daily., Disp: 540 tablet, Rfl: 3 .  metFORMIN (GLUCOPHAGE) 1000 MG tablet, Take 1,000 mg by mouth 2 (two) times daily., Disp: , Rfl:  .  rosuvastatin (CRESTOR) 10 MG tablet, Take 1 tablet (10 mg total) by mouth daily., Disp: 90 tablet, Rfl: 3 .  vitamin B-12 (CYANOCOBALAMIN) 1000 MCG tablet, Take 1,000 mcg by mouth daily., Disp: , Rfl:   Social History   Tobacco Use  Smoking Status Former Smoker  . Packs/day: 0.50  . Years: 30.00  . Pack years: 15.00  . Types: Cigarettes  . Quit date: 2017  . Years since quitting: 5.2  Smokeless Tobacco Never Used    No Known Allergies Objective:  There were no vitals filed for this  visit. There is no height or weight on file to calculate BMI. Constitutional Well developed. Well nourished.  Vascular Dorsalis pedis pulses palpable bilaterally. Posterior tibial pulses palpable bilaterally. Capillary refill normal to all digits.  No cyanosis or clubbing noted. Pedal hair growth normal.  Neurologic Normal speech. Oriented to person, place, and time. Protective sensation absent  Dermatologic Wound Location: Right hallux wound with fat layer exposed Wound Base: Mixed Granular/Fibrotic Peri-wound: Calloused Exudate: Scant/small amount Serous exudate Wound Measurements: -See below  Orthopedic: No pain to palpation either foot.   Radiographs:3 views of skeletally mature adult right foot: No cortical destruction irregularity noted.  No signs of osteomyelitis noted.  No other bony abnormalities identified.  No soft tissue emphysema noted.  Soft tissue defect noted at the hallux. Assessment:   1. 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 Type 2 diabetes mellitus with diabetic polyneuropathy, with long-term current use of insulin ([place])    Plan:  Patient was evaluated and treated and all questions answered.  Ulcer right hallux with fat layer exposed -Debridement as below. -Dressed with Betadine wet-to-dry, DSD. -Continue off-loading with surgical shoe. -Given the stagnation of the wound I believe patient will benefit from advanced wound care from the wound care center -She has an appointment scheduled with the wound care center April.  I will continue to follow her until she is established in the wound care center.  Procedure: Excisional Debridement of Wound Tool: Sharp chisel blade/tissue nipper Rationale: Removal of non-viable soft tissue from the wound to promote healing.  Anesthesia: none Pre-Debridement Wound Measurements: 1 cm x 0.5 cm x 0.3 cm  Post-Debridement Wound Measurements: 1.2 cm x 0.6 cm x 0.3 cm  Type of Debridement: Sharp  Excisional Tissue Removed: Non-viable soft tissue Blood loss: Minimal (<50cc) Depth of Debridement: subcutaneous tissue. Technique: Sharp excisional debridement to bleeding, viable wound base.  Wound Progress: The wound appears to be stagnant and has not decreased in size since previous visit Dressing: Dry, sterile, compression dressing. Disposition: Patient tolerated procedure well. Patient to return in 1 week for follow-up.  No follow-ups on file.

## 2020-11-03 NOTE — Progress Notes (Signed)
   TELEHEALTH GYNECOLOGY VISIT ENCOUNTER NOTE  Provider location: [facility]   Patient location: Home  I connected with [name] on 11/03/20 at 10:45 AM EDT by telephone and verified that I am speaking with the correct person using two identifiers. Patient was unable to do MyChart audiovisual encounter due to technical difficulties, she tried several times.    I discussed the limitations, risks, security and privacy concerns of performing an evaluation and management service by telephone and the availability of in person appointments. I also discussed with the patient that there may be a patient responsible charge related to this service. The patient expressed understanding and agreed to proceed.   History:  [name] is a 50 y.o. [phone] female being evaluated today for menopause. She denies any abnormal vaginal discharge, bleeding, pelvic pain or other concerns.  No menses, No LMP recorded (lmp unknown). (Menstrual status: Irregular Periods). FSH was 76 and she does not have definite VMS     Past Medical History:  Diagnosis Date  . Arthritis   . Asthma   . Cataract    Mixed form OU  . CKD (chronic kidney disease)   . Coronary artery disease   . Diabetes mellitus   . Diabetic retinopathy ([place])    NPDR OU  . Hyperlipidemia   . Hypertension   . Hypertensive retinopathy    OU  . Left thyroid nodule    diagnosed 07/2018  . Pseudotumor cerebri   . Stroke ([place])   . Vitamin D deficiency    Past Surgical History:  Procedure Laterality Date  . ACHILLES TENDON REPAIR    . TUBAL LIGATION     The following portions of the patient's history were reviewed and updated as appropriate: allergies, current medications, past family history, past medical history, past social history, past surgical history and problem list.   Health Maintenance:  Normal pap and negative HRHPV on 12/2019.  Normal mammogram on 04/2020.   Review of Systems:  Pertinent  items noted in HPI and remainder of comprehensive ROS otherwise negative.  Physical Exam:   General:  Alert, oriented and cooperative.   Mental Status: Normal mood and affect perceived. Normal judgment and thought content.  Physical exam deferred due to nature of the encounter  Labs and Imaging No results found for this or any previous visit (from the past 336 hour(s)). DG Foot Complete Right  Result Date: 10/09/2020 Please see detailed radiograph report in office note.     Assessment and Plan:     Menopause  Reassured to not expect menses after this. Pap and mammography routine       I discussed the assessment and treatment plan with the patient. The patient was provided an opportunity to ask questions and all were answered. The patient agreed with the plan and demonstrated an understanding of the instructions.   The patient was advised to call back or seek an in-person evaluation/go to the ED if the symptoms worsen or if the condition fails to improve as anticipated.  I provided 12 minutes of non-face-to-face time during this encounter.   Emeterio Reeve, MD [facility] [place], [place]

## 2020-11-19 ENCOUNTER — Other Ambulatory Visit: Payer: Self-pay

## 2020-11-19 ENCOUNTER — Encounter (HOSPITAL_BASED_OUTPATIENT_CLINIC_OR_DEPARTMENT_OTHER): Payer: Medicaid Other | Attending: Internal Medicine | Admitting: Internal Medicine

## 2020-11-19 DIAGNOSIS — L97519 Non-pressure chronic ulcer of other part of right foot with unspecified severity: Secondary | ICD-10-CM | POA: Diagnosis not present

## 2020-11-19 DIAGNOSIS — Z833 Family history of diabetes mellitus: Secondary | ICD-10-CM | POA: Insufficient documentation

## 2020-11-19 DIAGNOSIS — I639 Cerebral infarction, unspecified: Secondary | ICD-10-CM | POA: Diagnosis not present

## 2020-11-19 DIAGNOSIS — E11621 Type 2 diabetes mellitus with foot ulcer: Secondary | ICD-10-CM

## 2020-11-19 DIAGNOSIS — E1122 Type 2 diabetes mellitus with diabetic chronic kidney disease: Secondary | ICD-10-CM | POA: Diagnosis not present

## 2020-11-19 DIAGNOSIS — I10 Essential (primary) hypertension: Secondary | ICD-10-CM | POA: Diagnosis not present

## 2020-11-19 DIAGNOSIS — N189 Chronic kidney disease, unspecified: Secondary | ICD-10-CM | POA: Insufficient documentation

## 2020-11-19 DIAGNOSIS — Z87891 Personal history of nicotine dependence: Secondary | ICD-10-CM | POA: Diagnosis not present

## 2020-11-19 DIAGNOSIS — I129 Hypertensive chronic kidney disease with stage 1 through stage 4 chronic kidney disease, or unspecified chronic kidney disease: Secondary | ICD-10-CM | POA: Insufficient documentation

## 2020-11-19 DIAGNOSIS — Z794 Long term (current) use of insulin: Secondary | ICD-10-CM | POA: Diagnosis not present

## 2020-11-19 DIAGNOSIS — Z8249 Family history of ischemic heart disease and other diseases of the circulatory system: Secondary | ICD-10-CM | POA: Insufficient documentation

## 2020-11-19 NOTE — Progress Notes (Addendum)
[name], [name] ([id number]) Visit Report for 11/19/2020 Chief Complaint Document Details Patient Name: Date of Service: [name] 11/19/2020 9:00 A M Medical Record Number: [id number] Patient Account Number: [id number] Date of Birth/Sex: Treating RN: 26-Jul-1971 (50 y.o. [name], [name] Primary Care Provider: [name] Other Clinician: Referring Provider: Treating Provider/Extender: [name] in Treatment: 0 Information Obtained from: Patient Chief Complaint Right foot wound Electronic Signature(s) Signed: 11/19/2020 1:47:32 PM By: Kalman Shan DO Entered By: Kalman Shan on 11/19/2020 10:33:17 -------------------------------------------------------------------------------- Debridement Details Patient Name: Date of Service: [name] 11/19/2020 9:00 A M Medical Record Number: [id number] Patient Account Number: [id number] Date of Birth/Sex: Treating RN: [birth year] (50 y.o. [name] Primary Care Provider: [name] Other Clinician: Referring Provider: Treating Provider/Extender: [name] in Treatment: 0 Debridement Performed for Assessment: Wound #1 Right T Great oe Performed By: Physician Kalman Shan, DO Debridement Type: Debridement Severity of Tissue Pre Debridement: Fat layer exposed Level of Consciousness (Pre-procedure): Awake and Alert Pre-procedure Verification/Time Out Yes - 10:00 Taken: Start Time: 10:00 T Area Debrided (L x W): otal 2 (cm) x 2 (cm) = 4 (cm) Tissue and other material debrided: Viable, Non-Viable, Callus, Subcutaneous, Skin: Epidermis Level: Skin/Subcutaneous Tissue Debridement Description: Excisional Instrument: Blade, Curette Bleeding: Minimum Hemostasis Achieved: Pressure End Time: 10:16 Procedural Pain: 0 Post Procedural Pain: 0 Response to Treatment: Procedure was tolerated well Level of Consciousness (Post-  Awake and Alert procedure): Post Debridement Measurements of Total Wound Length: (cm) 0.8 Width: (cm) 0.8 Depth: (cm) 0.6 Volume: (cm) 0.302 Character of Wound/Ulcer Post Debridement: Improved Severity of Tissue Post Debridement: Fat layer exposed Post Procedure Diagnosis Same as Pre-procedure Electronic Signature(s) Signed: 11/19/2020 1:41:00 PM By: Baruch Gouty RN, BSN Signed: 11/19/2020 1:47:32 PM By: Kalman Shan DO Entered By: Baruch Gouty on 11/19/2020 10:16:26 -------------------------------------------------------------------------------- HPI Details Patient Name: Date of Service: [name] 11/19/2020 9:00 A M Medical Record Number: [id number] Patient Account Number: [id number] Date of Birth/Sex: Treating RN: 09-Jan-1971 (50 y.o. [name], [name] Primary Care Provider: [name] Other Clinician: Referring Provider: Treating Provider/Extender: [name] in Treatment: 0 History of Present Illness HPI Description: Ms. Oluwadara Pelon is a 50 year old female with past medical history of insulin-dependent type 2 diabetes, previous left pontine CVA, essential hypertension that presents to the clinic today for evaluation of her right foot wound. She states that in December 2021 she had cut open her right great toe on a door step . It became infected and she was treated with antibiotics. She started following with podiatry in February and has been using Betadine daily on the wound. She is also using a surgical shoe to help with offloading. She has diabetic neuropathy and does not report pain to the wound. She denies any purulent drainage, fever/chills or increased warmth or erythema to the foot. Electronic Signature(s) Signed: 11/19/2020 1:47:32 PM By: Kalman Shan DO Entered By: Kalman Shan on 11/19/2020 10:43:53 -------------------------------------------------------------------------------- Physical Exam  Details Patient Name: Date of Service: [name] 11/19/2020 9:00 A M Medical Record Number: [id number] Patient Account Number: [id number] Date of Birth/Sex: Treating RN: 23-Feb-1971 (50 y.o. [name], [name] Primary Care Provider: [name] Other Clinician: Referring Provider: Treating Provider/Extender: [name] in Treatment: 0 Constitutional respirations regular, non-labored and within target range for patient.. Cardiovascular 2+ dorsalis pedis/posterior tibialis pulses. Psychiatric pleasant and cooperative. Notes Right Hallux ulcer, plantar: Callous throughout with  small slit with serosanguinous drainage. No increased warmth or erythema to the foot. No swelling noted. Electronic Signature(s) Signed: 11/19/2020 1:47:32 PM By: Kalman Shan DO Entered By: Kalman Shan on 11/19/2020 10:45:52 -------------------------------------------------------------------------------- Physician Orders Details Patient Name: Date of Service: [name] 11/19/2020 9:00 A M Medical Record Number: [id number] Patient Account Number: [id number] Date of Birth/Sex: Treating RN: 1971-05-09 (50 y.o. [name] Primary Care Provider: [name] Other Clinician: Referring Provider: Treating Provider/Extender: [name] in Treatment: 0 Verbal / Phone Orders: No Diagnosis Coding ICD-10 Coding Code Description E11.621 Type 2 diabetes mellitus with foot ulcer I63.9 Cerebral infarction, unspecified I10 Essential (primary) hypertension Follow-up Appointments Return Appointment in 1 week. Bathing/ Shower/ Hygiene May shower and wash wound with soap and water. - on days when dressing is changed Off-Loading Open toe surgical shoe to: - right foot, felt donut to shoe to offload Wound Treatment Wound #1 - T Great oe Wound Laterality: Right Prim Dressing: KerraCel Ag Gelling Fiber Dressing,  2x2 in (silver alginate) (DME) (Generic) Every Other Day/15 Days ary Discharge Instructions: Apply silver alginate to wound bed as instructed Secondary Dressing: Woven Gauze Sponges 2x2 in (DME) (Generic) Every Other Day/15 Days Discharge Instructions: Apply over primary dressing as directed. Secured With: Child psychotherapist, Sterile 2x75 (in/in) (Generic) Every Other Day/15 Days Discharge Instructions: Secure with stretch gauze as directed. Secured With: Paper Tape, 2x10 (in/yd) (DME) (Generic) Every Other Day/15 Days Discharge Instructions: Secure dressing with tape as directed. Electronic Signature(s) Signed: 11/25/2020 6:20:01 PM By: Baruch Gouty RN, BSN Signed: 12/15/2020 3:23:56 PM By: Kalman Shan DO Previous Signature: 11/19/2020 1:47:32 PM Version By: Kalman Shan DO Entered By: Baruch Gouty on 11/20/2020 11:43:08 -------------------------------------------------------------------------------- Problem List Details Patient Name: Date of Service: [name] 11/19/2020 9:00 A M Medical Record Number: [id number] Patient Account Number: [id number] Date of Birth/Sex: Treating RN: March 20, 1971 (50 y.o. [name], [name] Primary Care Provider: [name] Other Clinician: Referring Provider: Treating Provider/Extender: [name] in Treatment: 0 Active Problems ICD-10 Encounter Code Description Active Date MDM Diagnosis E11.621 Type 2 diabetes mellitus with foot ulcer 11/19/2020 No Yes I63.9 Cerebral infarction, unspecified 11/19/2020 No Yes I10 Essential (primary) hypertension 11/19/2020 No Yes Inactive Problems Resolved Problems Electronic Signature(s) Signed: 11/19/2020 1:47:32 PM By: Kalman Shan DO Entered By: Kalman Shan on 11/19/2020 10:31:08 -------------------------------------------------------------------------------- Progress Note Details Patient Name: Date of Service: MA [name]  NDA E. 11/19/2020 9:00 A M Medical Record Number: [id number] Patient Account Number: [id number] Date of Birth/Sex: Treating RN: 10/21/1970 (50 y.o. [name], [name] Primary Care Provider: [name] Other Clinician: Referring Provider: Treating Provider/Extender: [name] in Treatment: 0 Subjective Chief Complaint Information obtained from Patient Right foot wound History of Present Illness (HPI) Ms. Aleathea Ridl is a 50 year old female with past medical history of insulin-dependent type 2 diabetes, previous left pontine CVA, essential hypertension that presents to the clinic today for evaluation of her right foot wound. She states that in December 2021 she had cut open her right great toe on a door step . It became infected and she was treated with antibiotics. She started following with podiatry in February and has been using Betadine daily on the wound. She is also using a surgical shoe to help with offloading. She has diabetic neuropathy and does not report pain to the wound. She denies any purulent drainage, fever/chills or increased warmth or erythema to the foot. Patient History  Information obtained from Patient. Allergies No Known Allergies Family History Cancer - Maternal Grandparents, Diabetes - Mother,Father,Maternal Grandparents,Paternal Grandparents, Heart Disease - Mother,Father,Maternal Grandparents,Paternal Grandparents,Siblings, Hypertension - Mother,Father, Kidney Disease - Maternal Grandparents, Stroke - Maternal Grandparents,Mother, No family history of Hereditary Spherocytosis, Lung Disease, Seizures, Thyroid Problems, Tuberculosis. Social History Former smoker - quit 3-4 yrs ago, Marital Status - Single, Alcohol Use - Rarely, Drug Use - Current History - marijuana, Caffeine Use - Rarely. Medical History Eyes Denies history of Cataracts, Glaucoma, Optic Neuritis Hematologic/Lymphatic Patient has history of  Anemia Respiratory Patient has history of Asthma, Sleep Apnea - CPAP Cardiovascular Patient has history of Coronary Artery Disease, Hypertension Endocrine Patient has history of Type II Diabetes Denies history of Type I Diabetes Genitourinary Denies history of End Stage Renal Disease Integumentary (Skin) Denies history of History of Burn Musculoskeletal Patient has history of Osteoarthritis Neurologic Patient has history of Neuropathy Oncologic Denies history of Received Chemotherapy, Received Radiation Psychiatric Patient has history of Confinement Anxiety Denies history of Anorexia/bulimia Blood sugar is tested. Hospitalization/Surgery History - achilles tendon rupture right. Medical A Surgical History Notes nd Constitutional Symptoms (General Health) vitamin D deficiency Eyes hypertensive retinopathy Cardiovascular hyperlipidemia Endocrine left thyroid nodule Genitourinary CKD Neurologic stroke Review of Systems (ROS) Constitutional Symptoms (General Health) Denies complaints or symptoms of Fatigue, Fever, Chills, Marked Weight Change. Eyes Complains or has symptoms of Glasses / Contacts - glasses. Ear/Nose/Mouth/Throat Denies complaints or symptoms of Chronic sinus problems or rhinitis. Respiratory Complains or has symptoms of Shortness of Breath. Denies complaints or symptoms of Chronic or frequent coughs. Cardiovascular Denies complaints or symptoms of Chest pain. Gastrointestinal Denies complaints or symptoms of Frequent diarrhea, Nausea, Vomiting. Endocrine Denies complaints or symptoms of Heat/cold intolerance. Integumentary (Skin) Complains or has symptoms of Wounds - right great toe. Musculoskeletal Denies complaints or symptoms of Muscle Pain, Muscle Weakness. Neurologic Complains or has symptoms of Numbness/parasthesias. Psychiatric Complains or has symptoms of Claustrophobia - mild. Denies complaints or symptoms of  Suicidal. Objective Constitutional respirations regular, non-labored and within target range for patient.. Vitals Time Taken: 8:51 AM, Height: 69 in, Source: Stated, Weight: 250 lbs, Source: Stated, BMI: 36.9, Temperature: 98.1 F, Pulse: 72 bpm, Respiratory Rate: 18 breaths/min, Blood Pressure: 168/73 mmHg, Capillary Blood Glucose: 89 mg/dl. General Notes: glucose per pt report yesterday am Cardiovascular 2+ dorsalis pedis/posterior tibialis pulses. Psychiatric pleasant and cooperative. General Notes: Right Hallux ulcer, plantar: Callous throughout with small slit with serosanguinous drainage. No increased warmth or erythema to the foot. No swelling noted. Integumentary (Hair, Skin) Wound #1 status is Open. Original cause of wound was Puncture. The date acquired was: 07/09/2020. The wound is located on the Right T Great. The wound oe measures 0.8cm length x 0.2cm width x 0.7cm depth; 0.126cm^2 area and 0.088cm^3 volume. There is Fat Layer (Subcutaneous Tissue) exposed. There is no tunneling or undermining noted. There is a medium amount of serosanguineous drainage noted. The wound margin is thickened. There is small (1-33%) pink granulation within the wound bed. There is no necrotic tissue within the wound bed. Assessment Active Problems ICD-10 Type 2 diabetes mellitus with foot ulcer Cerebral infarction, unspecified Essential (primary) hypertension Patient presents with a 3-29-monthhistory of non-healing diabetic foot ulcer. She has been following with podiatry and currently uses Betadine daily on the wound. An x-ray was done at their office that showed no signs of osteo. There is a small opening today that is draining serosanguineous fluid. No signs of infection. There was an abundance of callus throughout  the entire toe. This was debrided with blade and curette. I was able to go down to granulation tissue with no signs of probing to the bone. I would like to change the dressing to  silver alginate every other day. We discussed the importance of offloading to help heal the wound. She is currently using a surgical shoe and we will add an offloading felt pad. We also discussed that if we do not see progress in the next couple weeks we can put her in a total contact cast for which she was agreeable to. Procedures Wound #1 Pre-procedure diagnosis of Wound #1 is a Diabetic Wound/Ulcer of the Lower Extremity located on the Right T Great .Severity of Tissue Pre Debridement is: oe Fat layer exposed. There was a Excisional Skin/Subcutaneous Tissue Debridement with a total area of 4 sq cm performed by Kalman Shan, DO. With the following instrument(s): Blade, and Curette to remove Viable and Non-Viable tissue/material. Material removed includes Callus, Subcutaneous Tissue, and Skin: Epidermis. No specimens were taken. A time out was conducted at 10:00, prior to the start of the procedure. A Minimum amount of bleeding was controlled with Pressure. The procedure was tolerated well with a pain level of 0 throughout and a pain level of 0 following the procedure. Post Debridement Measurements: 0.8cm length x 0.8cm width x 0.6cm depth; 0.302cm^3 volume. Character of Wound/Ulcer Post Debridement is improved. Severity of Tissue Post Debridement is: Fat layer exposed. Post procedure Diagnosis Wound #1: Same as Pre-Procedure Plan Follow-up Appointments: Return Appointment in 1 week. Bathing/ Shower/ Hygiene: May shower and wash wound with soap and water. - on days when dressing is changed Off-Loading: Open toe surgical shoe to: - right foot, felt donut to shoe to offload WOUND #1: - T Great Wound Laterality: Right oe Prim Dressing: KerraCel Ag Gelling Fiber Dressing, 2x2 in (silver alginate) (DME) (Generic) Every Other Day/15 Days ary Discharge Instructions: Apply silver alginate to wound bed as instructed Secondary Dressing: Woven Gauze Sponges 2x2 in (DME) (Generic) Every Other  Day/15 Days Discharge Instructions: Apply over primary dressing as directed. Secured With: Child psychotherapist, Sterile 2x75 (in/in) (Generic) Every Other Day/15 Days Discharge Instructions: Secure with stretch gauze as directed. Secured With: Paper T ape, 2x10 (in/yd) (DME) (Generic) Every Other Day/15 Days Discharge Instructions: Secure dressing with tape as directed. 1. In office sharp debridement 2. Silver alginate every other day with dressing change 3. Offloading felt pad and surgical shoe 4. Follow-up in 1 week Electronic Signature(s) Signed: 11/25/2020 6:20:01 PM By: Baruch Gouty RN, BSN Signed: 12/15/2020 3:23:56 PM By: Kalman Shan DO Previous Signature: 11/19/2020 1:47:32 PM Version By: Kalman Shan DO Entered By: Baruch Gouty on 11/20/2020 11:44:23 -------------------------------------------------------------------------------- HxROS Details Patient Name: Date of Service: [name] 11/19/2020 9:00 A M Medical Record Number: [id number] Patient Account Number: [id number] Date of Birth/Sex: Treating RN: 12-Apr-1971 (50 y.o. [name] Primary Care Provider: [name] Other Clinician: Referring Provider: Treating Provider/Extender: [name] in Treatment: 0 Information Obtained From Patient Constitutional Symptoms (General Health) Complaints and Symptoms: Negative for: Fatigue; Fever; Chills; Marked Weight Change Medical History: Past Medical History Notes: vitamin D deficiency Eyes Complaints and Symptoms: Positive for: Glasses / Contacts - glasses Medical History: Negative for: Cataracts; Glaucoma; Optic Neuritis Past Medical History Notes: hypertensive retinopathy Ear/Nose/Mouth/Throat Complaints and Symptoms: Negative for: Chronic sinus problems or rhinitis Respiratory Complaints and Symptoms: Positive for: Shortness of Breath Negative for: Chronic or frequent coughs Medical  History: Positive  for: Asthma; Sleep Apnea - CPAP Cardiovascular Complaints and Symptoms: Negative for: Chest pain Medical History: Positive for: Coronary Artery Disease; Hypertension Past Medical History Notes: hyperlipidemia Gastrointestinal Complaints and Symptoms: Negative for: Frequent diarrhea; Nausea; Vomiting Endocrine Complaints and Symptoms: Negative for: Heat/cold intolerance Medical History: Positive for: Type II Diabetes Negative for: Type I Diabetes Past Medical History Notes: left thyroid nodule Time with diabetes: since age 42 Blood sugar tested every day: Yes Tested : 2-3 times per day Integumentary (Skin) Complaints and Symptoms: Positive for: Wounds - right great toe Medical History: Negative for: History of Burn Musculoskeletal Complaints and Symptoms: Negative for: Muscle Pain; Muscle Weakness Medical History: Positive for: Osteoarthritis Neurologic Complaints and Symptoms: Positive for: Numbness/parasthesias Medical History: Positive for: Neuropathy Past Medical History Notes: stroke Psychiatric Complaints and Symptoms: Positive for: Claustrophobia - mild Negative for: Suicidal Medical History: Positive for: Confinement Anxiety Negative for: Anorexia/bulimia Hematologic/Lymphatic Medical History: Positive for: Anemia Genitourinary Medical History: Negative for: End Stage Renal Disease Past Medical History Notes: CKD Immunological Oncologic Medical History: Negative for: Received Chemotherapy; Received Radiation Immunizations Pneumococcal Vaccine: Received Pneumococcal Vaccination: No Implantable Devices No devices added Hospitalization / Surgery History Type of Hospitalization/Surgery achilles tendon rupture right Family and Social History Cancer: Yes - Maternal Grandparents; Diabetes: Yes - Mother,Father,Maternal Grandparents,Paternal Grandparents; Heart Disease: Yes - Mother,Father,Maternal Grandparents,Paternal  Grandparents,Siblings; Hereditary Spherocytosis: No; Hypertension: Yes - Mother,Father; Kidney Disease: Yes - Maternal Grandparents; Lung Disease: No; Seizures: No; Stroke: Yes - Maternal Grandparents,Mother; Thyroid Problems: No; Tuberculosis: No; Former smoker - quit 3-4 yrs ago; Marital Status - Single; Alcohol Use: Rarely; Drug Use: Current History - marijuana; Caffeine Use: Rarely; Financial Concerns: No; Food, Clothing or Shelter Needs: No; Support System Lacking: No; Transportation Concerns: No Electronic Signature(s) Signed: 11/19/2020 1:41:00 PM By: Baruch Gouty RN, BSN Signed: 11/19/2020 1:47:32 PM By: Kalman Shan DO Entered By: Baruch Gouty on 11/19/2020 09:29:45 -------------------------------------------------------------------------------- [place] Details Patient Name: Date of Service: [name] 11/19/2020 Medical Record Number: [id number] Patient Account Number: [id number] Date of Birth/Sex: Treating RN: Oct 09, 1970 (50 y.o. [name], [name] Primary Care Provider: [name] Other Clinician: Referring Provider: Treating Provider/Extender: [name] in Treatment: 0 Diagnosis Coding ICD-10 Codes Code Description [phone] Type 2 diabetes mellitus with foot ulcer I63.9 Cerebral infarction, unspecified I10 Essential (primary) hypertension Facility Procedures CPT4 Code: [id number] Description: [id number] - WOUND CARE VISIT-LEV 3 EST PT Modifier: 25 Quantity: 1 CPT4 Code: [id number] Description: [id number] - DEB SUBQ TISSUE 20 SQ CM/< ICD-10 Diagnosis Description E11.621 Type 2 diabetes mellitus with foot ulcer Modifier: Quantity: 1 Electronic Signature(s) Signed: 11/25/2020 6:20:01 PM By: Baruch Gouty RN, BSN Signed: 12/15/2020 3:23:56 PM By: Kalman Shan DO Previous Signature: 11/19/2020 1:41:00 PM Version By: Baruch Gouty RN, BSN Previous Signature: 11/19/2020 1:47:32 PM Version By: Kalman Shan DO Entered  By: Baruch Gouty on 11/19/2020 14:01:49

## 2020-11-19 NOTE — Progress Notes (Signed)
[name], [name] for 11/19/2020 Abuse/Suicide Risk Screen Details Patient Name: Date of Service: [name]. 11/19/2020 9:00 A M Medical Record Number: [id number] Patient Account Number: [id number] Date of Birth/Sex: Treating RN: 26-Sep-1970 (50 y.o. [name] Primary Care Adrie Picking: [name] Other Clinician: Referring Demya Scruggs: Treating Farrell Pantaleo/Extender: [name] in Treatment: 0 Abuse/Suicide Risk Screen Items Answer ABUSE RISK SCREEN: Has anyone close to you tried to hurt or harm you recentlyo No Do you feel uncomfortable with anyone in your familyo No Has anyone forced you do things that you didnt want to doo No Electronic Signature(s) Signed: 11/19/2020 1:41:00 PM By: Baruch Gouty RN, BSN Entered By: Baruch Gouty on 11/19/2020 09:30:02 -------------------------------------------------------------------------------- Activities of Daily Living Details Patient Name: Date of Service: [name]. 11/19/2020 9:00 A M Medical Record Number: [id number] Patient Account Number: [id number] Date of Birth/Sex: Treating RN: [birth year] (50 y.o. [name] Primary Care Heinz Eckert: [name] Other Clinician: Referring Izumi Mixon: Treating Tiyon Sanor/Extender: [name] in Treatment: 0 Activities of Daily Living Items Answer Activities of Daily Living (Please select one for each item) Drive Automobile Completely Able T Medications ake Completely Able Use T elephone Completely Able Care for Appearance Completely Able Use T oilet Completely Able Bath / Shower Completely Able Dress Self Completely Able Feed Self Completely Able Walk Completely Able Get In / Out Bed Completely Able Housework Completely Able Prepare Meals Completely Able Handle Money Completely Able Shop for Self Completely Able Electronic Signature(s) Signed: 11/19/2020 1:41:00 PM  By: Baruch Gouty RN, BSN Entered By: Baruch Gouty on 11/19/2020 09:30:35 -------------------------------------------------------------------------------- Education Screening Details Patient Name: Date of Service: [name]. 11/19/2020 9:00 A M Medical Record Number: [id number] Patient Account Number: [id number] Date of Birth/Sex: Treating RN: [birth year] (50 y.o. [name] Primary Care Maygan Koeller: [name] Other Clinician: Referring Ayslin Kundert: Treating Jamae Tison/Extender: [name] in Treatment: 0 Primary Learner Assessed: Patient Learning Preferences/Education Level/Primary Language Learning Preference: Explanation, Demonstration, Printed Material Highest Education Level: College or Above Preferred Language: English Cognitive Barrier Language Barrier: No Translator Needed: No Memory Deficit: No Emotional Barrier: No Cultural/Religious Beliefs Affecting Medical Care: No Physical Barrier Impaired Vision: Yes Glasses Impaired Hearing: No Decreased Hand dexterity: No Knowledge/Comprehension Knowledge Level: High Comprehension Level: High Ability to understand written instructions: High Ability to understand verbal instructions: High Motivation Anxiety Level: Calm Cooperation: Cooperative Education Importance: Acknowledges Need Interest in Health Problems: Asks Questions Perception: Coherent Willingness to Engage in Self-Management High Activities: Readiness to Engage in Self-Management High Activities: Electronic Signature(s) Signed: 11/19/2020 1:41:00 PM By: Baruch Gouty RN, BSN Entered By: Baruch Gouty on 11/19/2020 09:31:07 -------------------------------------------------------------------------------- Fall Risk Assessment Details Patient Name: Date of Service: Teresa [name], [name] NDA [name]. 11/19/2020 9:00 A M Medical Record Number: [id number] Patient Account Number: [id number] Date of Birth/Sex: Treating  RN: June 24, 1971 (50 y.o. [name] Primary Care Advika Mclelland: [name] Other Clinician: Referring Oleg Oleson: Treating Aodhan Scheidt/Extender: [name] in Treatment: 0 Fall Risk Assessment Items Have you had 2 or more falls in the last 12 monthso 0 Yes Have you had any fall that resulted in injury in the last 12 monthso 0 No FALLS RISK SCREEN History of falling - immediate or within 3 months 25 Yes Secondary diagnosis (Do you have 2 or more medical diagnoseso) 0 No Ambulatory aid None/bed rest/wheelchair/nurse 0 Yes Crutches/cane/walker 0 No Furniture 0 No Intravenous therapy Access/Saline/Heparin Lock 0 No Gait/Transferring  Normal/ bed rest/ wheelchair 0 Yes Weak (short steps with or without shuffle, stooped but able to lift head while walking, may seek 0 No support from furniture) Impaired (short steps with shuffle, may have difficulty arising from chair, head down, impaired 0 No balance) Mental Status Oriented to own ability 0 Yes Electronic Signature(s) Signed: 11/19/2020 1:41:00 PM By: Baruch Gouty RN, BSN Entered By: Baruch Gouty on 11/19/2020 09:31:46 -------------------------------------------------------------------------------- Foot Assessment Details Patient Name: Date of Service: [name]. 11/19/2020 9:00 A M Medical Record Number: [id number] Patient Account Number: [id number] Date of Birth/Sex: Treating RN: 05-Jan-1971 (50 y.o. [name] Primary Care Loran Fleet: [name] Other Clinician: Referring Khiley Lieser: Treating Meisha Salone/Extender: [name] in Treatment: 0 Foot Assessment Items Site Locations + = Sensation present, - = Sensation absent, C = Callus, U = Ulcer R = Redness, W = Warmth, M = Maceration, PU = Pre-ulcerative lesion F = Fissure, S = Swelling, D = Dryness Assessment Right: Left: Other Deformity: No No Prior Foot Ulcer: No No Prior  Amputation: No No Charcot Joint: No No Ambulatory Status: Ambulatory Without Help Gait: Steady Electronic Signature(s) Signed: 11/19/2020 1:41:00 PM By: Baruch Gouty RN, BSN Entered By: Baruch Gouty on 11/19/2020 09:34:17 -------------------------------------------------------------------------------- Nutrition Risk Screening Details Patient Name: Date of Service: [name]. 11/19/2020 9:00 A M Medical Record Number: [id number] Patient Account Number: [id number] Date of Birth/Sex: Treating RN: 10/23/1970 (50 y.o. [name] Primary Care Amarissa Koerner: [name] Other Clinician: Referring Yolande Skoda: Treating Alan Drummer/Extender: [name] in Treatment: 0 Height (in): 69 Weight (lbs): 250 Body Mass Index (BMI): 36.9 Nutrition Risk Screening Items Score Screening NUTRITION RISK SCREEN: I have an illness or condition that made me change the kind and/or amount of food I eat 0 No I eat fewer than two meals per day 3 Yes I eat few fruits and vegetables, or milk products 0 No I have three or more drinks of beer, liquor or wine almost every day 0 No I have tooth or mouth problems that make it hard for me to eat 0 No I don't always have enough money to buy the food I need 0 No I eat alone most of the time 0 No I take three or more different prescribed or over-the-counter drugs a day 1 Yes Without wanting to, I have lost or gained 10 pounds in the last six months 2 Yes I am not always physically able to shop, cook and/or feed myself 0 No Nutrition Protocols Good Risk Protocol Moderate Risk Protocol Provide education on elevated blood High Risk Proctocol 0 sugars and impact on wound healing, as applicable Risk Level: High Risk Score: 6 Electronic Signature(s) Signed: 11/19/2020 1:41:00 PM By: Baruch Gouty RN, BSN Entered By: Baruch Gouty on 11/19/2020 09:32:27

## 2020-11-25 NOTE — Progress Notes (Signed)
[name], [name] ([id number]) Visit Report for 11/19/2020 Allergy List Details Patient Name: Date of Service: Teresa [name] NDA E. 11/19/2020 9:00 A M Medical Record Number: [id number] Patient Account Number: [id number] Date of Birth/Sex: Treating RN: [name] (50 y.o. Female) [name] Primary Care [name]: [name] Other Clinician: Referring [name]: Treating [name] in Treatment: 0 Allergies Active Allergies No Known Allergies Allergy Notes Electronic Signature(s) Signed: 11/19/2020 1:41:00 PM By: Teresa Gouty RN, BSN Entered By: [name] on 11/19/2020 09:18:46 -------------------------------------------------------------------------------- Arrival Information Details Patient Name: Date of Service: Teresa [name] NDA E. 11/19/2020 9:00 A M Medical Record Number: [id number] Patient Account Number: [id number] Date of Birth/Sex: Treating RN: [birth year] (50 y.o. Female) Rhae Hammock Primary Care [name]: [name] Other Clinician: Referring [name]: Treating [name] in Treatment: 0 Visit Information Patient Arrived: Ambulatory Arrival Time: 08:51 Accompanied By: self Transfer Assistance: None Patient Identification Verified: Yes Secondary Verification Process Completed: Yes Patient Requires Transmission-Based Precautions: No Patient Has Alerts: No Electronic Signature(s) Signed: 11/19/2020 1:41:00 PM By: Teresa Gouty RN, BSN Entered By: [name] on 11/19/2020 09:17:37 -------------------------------------------------------------------------------- Clinic Level of Care Assessment Details Patient Name: Date of Service: [name] [facility] NDA E. 11/19/2020 9:00 A M Medical Record Number: [id number] Patient Account Number: [id number] Date of Birth/Sex: Treating RN: [name] (50 y.o. Female) [name] Primary Care  Revel Stellmach: [name] Other Clinician: Referring Kayson Bullis: Treating [name]/Extender: [name] in Treatment: 0 Clinic Level of Care Assessment Items TOOL 1 Quantity Score '[]'$  - 0 Use when EandM and Procedure is performed on INITIAL visit ASSESSMENTS - Nursing Assessment / Reassessment X- 1 20 General Physical Exam (combine w/ comprehensive assessment (listed just below) when performed on new pt. evals) X- 1 25 Comprehensive Assessment (HX, ROS, Risk Assessments, Wounds Hx, etc.) ASSESSMENTS - Wound and Skin Assessment / Reassessment '[]'$  - 0 Dermatologic / Skin Assessment (not related to wound area) ASSESSMENTS - Ostomy and/or Continence Assessment and Care '[]'$  - 0 Incontinence Assessment and Management '[]'$  - 0 Ostomy Care Assessment and Management (repouching, etc.) PROCESS - Coordination of Care X - Simple Patient / Family Education for ongoing care 1 15 '[]'$  - 0 Complex (extensive) Patient / Family Education for ongoing care X- 1 10 Staff obtains Programmer, systems, Records, T Results / Process Orders est '[]'$  - 0 Staff telephones HHA, Nursing Homes / Clarify orders / etc '[]'$  - 0 Routine Transfer to another Facility (non-emergent condition) '[]'$  - 0 Routine Hospital Admission (non-emergent condition) X- 1 15 New Admissions / Biomedical engineer / Ordering NPWT Apligraf, etc. , '[]'$  - 0 Emergency Hospital Admission (emergent condition) PROCESS - Special Needs '[]'$  - 0 Pediatric / Minor Patient Management '[]'$  - 0 Isolation Patient Management '[]'$  - 0 Hearing / Language / Visual special needs '[]'$  - 0 Assessment of Community assistance (transportation, D/C planning, etc.) '[]'$  - 0 Additional assistance / Altered mentation '[]'$  - 0 Support Surface(s) Assessment (bed, cushion, seat, etc.) INTERVENTIONS - Miscellaneous '[]'$  - 0 External ear exam '[]'$  - 0 Patient Transfer (multiple staff / Civil Service fast streamer / Similar devices) '[]'$  - 0 Simple Staple / Suture  removal (25 or less) '[]'$  - 0 Complex Staple / Suture removal (26 or more) '[]'$  - 0 Hypo/Hyperglycemic Management (do not check if billed separately) X- 1 15 Ankle / Brachial Index (ABI) - do not check if billed separately Has the patient been seen at the hospital within the last three years: Yes Total Score: 100  Level Of Care: New/Established - Level 3 Electronic Signature(s) Signed: 11/19/2020 1:41:00 PM By: Teresa Gouty RN, BSN Entered By: [name] on 11/19/2020 10:02:45 -------------------------------------------------------------------------------- Encounter Discharge Information Details Patient Name: Date of Service: Teresa [name] NDA E. 11/19/2020 9:00 A M Medical Record Number: [id number] Patient Account Number: [id number] Date of Birth/Sex: Treating RN: [name] (50 y.o. Female) [name] Primary Care [name]: [name] Other Clinician: Referring [name]: Treating [name]/Extender: [name] in Treatment: 0 Encounter Discharge Information Items Post Procedure Vitals Discharge Condition: Stable Temperature (F): 98.1 Ambulatory Status: Ambulatory Pulse (bpm): 72 Discharge Destination: Home Respiratory Rate (breaths/min): 18 Transportation: Other Blood Pressure (mmHg): 168/73 Accompanied By: self Schedule Follow-up Appointment: Yes Clinical Summary of Care: Patient Declined Notes transportation service Electronic Signature(s) Signed: 11/19/2020 1:41:00 PM By: Teresa Gouty RN, BSN Entered By: [name] on 11/19/2020 11:00:51 -------------------------------------------------------------------------------- Lower Extremity Assessment Details Patient Name: Date of Service: Teresa [name] NDA E. 11/19/2020 9:00 A M Medical Record Number: [id number] Patient Account Number: [id number] Date of Birth/Sex: Treating RN: [birth year] (50 y.o. Female) [name] Primary Care Ajah Vanhoose: [name] Other  Clinician: Referring Brandalyn Harting: Treating Isabella Ida/Extender: [name] in Treatment: 0 Edema Assessment Assessed: [Left: No] [Right: No] Edema: [Left: N] [Right: o] Calf Left: Right: Point of Measurement: From Medial Instep 34.3 cm Ankle Left: Right: Point of Measurement: From Medial Instep 23 cm Vascular Assessment Pulses: Dorsalis Pedis Palpable: [Right:Yes] Blood Pressure: Brachial: [Right:168] Dorsalis Pedis: 152 Ankle: Posterior Tibial: 176 Ankle Brachial Index: [Right:1.05] Electronic Signature(s) Signed: 11/19/2020 1:41:00 PM By: Teresa Gouty RN, BSN Entered By: [name] on 11/19/2020 09:40:44 -------------------------------------------------------------------------------- Multi Wound Chart Details Patient Name: Date of Service: Teresa [name] NDA E. 11/19/2020 9:00 A M Medical Record Number: [id number] Patient Account Number: [id number] Date of Birth/Sex: Treating RN: [name] (50 y.o. Female) Rhae Hammock Primary Care Briggitte Boline: [name] Other Clinician: Referring Taylen Osorto: Treating Krysten Veronica/Extender: [name] in Treatment: 0 Vital Signs Height(in): 24 Capillary Blood Glucose(mg/dl): 41 Weight(lbs): 250 Pulse(bpm): 83 Body Mass Index(BMI): 30 Blood Pressure(mmHg): 168/73 Temperature(F): 98.1 Respiratory Rate(breaths/min): 18 Photos: [1:No Photos Right T Great oe] [N/A:N/A N/A] Wound Location: [1:Puncture] [N/A:N/A] Wounding Event: [1:Diabetic Wound/Ulcer of the Lower] [N/A:N/A] Primary Etiology: [1:Extremity Anemia, Asthma, Sleep Apnea,] [N/A:N/A] Comorbid History: [1:Coronary Artery Disease, Hypertension, Type II Diabetes, Osteoarthritis, Neuropathy, Confinement Anxiety 07/09/2020] [N/A:N/A] Date Acquired: [1:0] [N/A:N/A] Weeks of Treatment: [1:Open] [N/A:N/A] Wound Status: [1:0.8x0.2x0.7] [N/A:N/A] Measurements L x W x D (cm) [1:0.126] [N/A:N/A] A (cm) : rea  [1:0.088] [N/A:N/A] Volume (cm) : [1:0.00%] [N/A:N/A] % Reduction in A [1:rea: 0.00%] [N/A:N/A] % Reduction in Volume: [1:Grade 1] [N/A:N/A] Classification: [1:Medium] [N/A:N/A] Exudate A mount: [1:Serosanguineous] [N/A:N/A] Exudate Type: [1:red, brown] [N/A:N/A] Exudate Color: [1:Thickened] [N/A:N/A] Wound Margin: [1:Small (1-33%)] [N/A:N/A] Granulation A mount: [1:Pink] [N/A:N/A] Granulation Quality: [1:None Present (0%)] [N/A:N/A] Necrotic A mount: [1:Fat Layer (Subcutaneous Tissue): Yes N/A] Exposed Structures: [1:Fascia: No Tendon: No Muscle: No Joint: No Bone: No Small (1-33%)] [N/A:N/A] Epithelialization: [1:Debridement - Excisional] [N/A:N/A] Debridement: Pre-procedure Verification/Time Out 10:00 [N/A:N/A] Taken: [1:Callus, Subcutaneous] [N/A:N/A] Tissue Debrided: [1:Skin/Subcutaneous Tissue] [N/A:N/A] Level: [1:4] [N/A:N/A] Debridement A (sq cm): [1:rea Blade, Curette] [N/A:N/A] Instrument: [1:Minimum] [N/A:N/A] Bleeding: [1:Pressure] [N/A:N/A] Hemostasis A chieved: [1:0] [N/A:N/A] Procedural Pain: [1:0] [N/A:N/A] Post Procedural Pain: [1:Procedure was tolerated well] [N/A:N/A] Debridement Treatment Response: [1:0.8x0.8x0.6] [N/A:N/A] Post Debridement Measurements L x W x D (cm) [1:0.302] [N/A:N/A] Post Debridement Volume: (cm) [1:Debridement] [N/A:N/A] Procedures Performed: Treatment Notes Electronic Signature(s) Signed: 11/19/2020 1:47:32 PM By: Kalman Shan DO  Signed: 11/19/2020 5:45:48 PM By: Rhae Hammock RN Entered By: Kalman Shan on 11/19/2020 10:31:32 -------------------------------------------------------------------------------- Multi-Disciplinary Care Plan Details Patient Name: Date of Service: Teresa [name] NDA E. 11/19/2020 9:00 A M Medical Record Number: [id number] Patient Account Number: [id number] Date of Birth/Sex: Treating RN: [name] (50 y.o. Female) [name] Primary Care Hadlea Furuya: [name] Other  Clinician: Referring Laniqua Torrens: Treating Pegah Segel/Extender: [name] in Treatment: 0 Multidisciplinary Care Plan reviewed with physician Active Inactive Abuse / Safety / Falls / Self Care Management Nursing Diagnoses: History of Falls Potential for falls Goals: Patient will not experience any injury related to falls Date Initiated: 11/19/2020 Target Resolution Date: 12/17/2020 Goal Status: Active Interventions: Assess fall risk on admission and as needed Assess impairment of mobility on admission and as needed per policy Notes: Nutrition Nursing Diagnoses: Impaired glucose control: actual or potential Potential for alteratiion in Nutrition/Potential for imbalanced nutrition Goals: Patient/caregiver will maintain therapeutic glucose control Date Initiated: 11/19/2020 Target Resolution Date: 12/17/2020 Goal Status: Active Interventions: Assess patient nutrition upon admission and as needed per policy Provide education on elevated blood sugars and impact on wound healing Provide education on nutrition Treatment Activities: Patient referred to Primary Care Physician for further nutritional evaluation : 11/19/2020 Notes: Wound/Skin Impairment Nursing Diagnoses: Impaired tissue integrity Knowledge deficit related to ulceration/compromised skin integrity Goals: Patient/caregiver will verbalize understanding of skin care regimen Date Initiated: 11/19/2020 Target Resolution Date: 12/17/2020 Goal Status: Active Ulcer/skin breakdown will have a volume reduction of 30% by week 4 Date Initiated: 11/19/2020 Target Resolution Date: 12/17/2020 Goal Status: Active Interventions: Assess patient/caregiver ability to obtain necessary supplies Assess patient/caregiver ability to perform ulcer/skin care regimen upon admission and as needed Assess ulceration(s) every visit Provide education on ulcer and skin care Treatment Activities: Skin care regimen initiated  : 11/19/2020 Topical wound management initiated : 11/19/2020 Notes: Electronic Signature(s) Signed: 11/19/2020 1:41:00 PM By: Teresa Gouty RN, BSN Entered By: [name] on 11/19/2020 09:59:05 -------------------------------------------------------------------------------- Pain Assessment Details Patient Name: Date of Service: Charlton Haws NDA E. 11/19/2020 9:00 A M Medical Record Number: [id number] Patient Account Number: [id number] Date of Birth/Sex: Treating RN: [name] (50 y.o. Female) [name] Primary Care Apollo Timothy: [name] Other Clinician: Referring Shareef Eddinger: Treating Ciria Bernardini/Extender: [name] in Treatment: 0 Active Problems Location of Pain Severity and Description of Pain Patient Has Paino No Site Locations Rate the pain. Current Pain Level: 0 Pain Management and Medication Current Pain Management: Electronic Signature(s) Signed: 11/19/2020 1:41:00 PM By: Teresa Gouty RN, BSN Entered By: [name] on 11/19/2020 09:44:12 -------------------------------------------------------------------------------- Patient/Caregiver Education Details Patient Name: Date of Service: Teresa [name] NDA E. 4/14/2022andnbsp9:00 A M Medical Record Number: [id number] Patient Account Number: [id number] Date of Birth/Gender: Treating RN: [name] (50 y.o. Female) [name] Primary Care Physician: [name] Other Clinician: Referring Physician: Treating Physician/Extender: [name] in Treatment: 0 Education Assessment Education Provided To: Patient Education Topics Provided Elevated Blood Sugar/ Impact on Healing: Handouts: Elevated Blood Sugars: How Do They Affect Wound Healing Methods: Explain/Verbal, Printed Responses: Reinforcements needed, State content correctly Offloading: Handouts: What is Offloadingo Methods: Explain/Verbal, Printed Responses:  Reinforcements needed, State content correctly [place]: o Handouts: Welcome T [facility] [place] o Methods: Explain/Verbal, Printed Responses: Reinforcements needed, State content correctly Wound/Skin Impairment: Handouts: Caring for Your Ulcer, Skin Care Do's and Dont's Methods: Explain/Verbal, Printed Responses: Reinforcements needed, State content correctly Electronic Signature(s) Signed: 11/19/2020 1:41:00 PM By: Teresa Gouty RN, BSN Entered  By: [name] on 11/19/2020 10:00:30 -------------------------------------------------------------------------------- Wound Assessment Details Patient Name: Date of Service: Teresa [name] NDA E. 11/19/2020 9:00 A M Medical Record Number: [id number] Patient Account Number: [id number] Date of Birth/Sex: Treating RN: [birth year] (50 y.o. Female) [name] Primary Care Mattison Golay: [name] Other Clinician: Referring Dean Goldner: Treating Adira Limburg/Extender: [name] in Treatment: 0 Wound Status Wound Number: 1 Primary Diabetic Wound/Ulcer of the Lower Extremity Etiology: Wound Location: Right T Great oe Wound Open Wounding Event: Puncture Status: Date Acquired: 07/09/2020 Comorbid Anemia, Asthma, Sleep Apnea, Coronary Artery Disease, Weeks Of Treatment: 0 History: Hypertension, Type II Diabetes, Osteoarthritis, Neuropathy, Clustered Wound: No Confinement Anxiety Photos Wound Measurements Length: (cm) 0.8 Width: (cm) 0.2 Depth: (cm) 0.7 Area: (cm) 0.126 Volume: (cm) 0.088 % Reduction in Area: 0% % Reduction in Volume: 0% Epithelialization: Small (1-33%) Tunneling: No Undermining: No Wound Description Classification: Grade 1 Wound Margin: Thickened Exudate Amount: Medium Exudate Type: Serosanguineous Exudate Color: red, brown Foul Odor After Cleansing: No Slough/Fibrino No Wound Bed Granulation Amount: Small (1-33%) Exposed  Structure Granulation Quality: Pink Fascia Exposed: No Necrotic Amount: None Present (0%) Fat Layer (Subcutaneous Tissue) Exposed: Yes Tendon Exposed: No Muscle Exposed: No Joint Exposed: No Bone Exposed: No Treatment Notes Wound #1 (Toe Great) Wound Laterality: Right Cleanser Peri-Wound Care Topical Primary Dressing KerraCel Ag Gelling Fiber Dressing, 2x2 in (silver alginate) Discharge Instruction: Apply silver alginate to wound bed as instructed Secondary Dressing Woven Gauze Sponges 2x2 in Discharge Instruction: Apply over primary dressing as directed. Secured With Conforming Stretch Gauze Bandage, Sterile 2x75 (in/in) Discharge Instruction: Secure with stretch gauze as directed. Paper Tape, 2x10 (in/yd) Discharge Instruction: Secure dressing with tape as directed. Compression Wrap Compression Stockings Add-Ons Electronic Signature(s) Signed: 11/24/2020 8:07:32 AM By: Sandre Kitty Signed: 11/25/2020 6:20:01 PM By: Teresa Gouty RN, BSN Previous Signature: 11/19/2020 1:41:00 PM Version By: Teresa Gouty RN, BSN Entered By: Sandre Kitty on 11/19/2020 16:32:22 -------------------------------------------------------------------------------- Vitals Details Patient Name: Date of Service: Teresa [name] NDA E. 11/19/2020 9:00 A M Medical Record Number: [id number] Patient Account Number: [id number] Date of Birth/Sex: Treating RN: [birth year] (50 y.o. Female) Rhae Hammock Primary Care Dayvion Sans: [name] Other Clinician: Referring Alease Fait: Treating Jalessa Peyser/Extender: [name] in Treatment: 0 Vital Signs Time Taken: 08:51 Temperature (F): 98.1 Height (in): 69 Pulse (bpm): 72 Source: Stated Respiratory Rate (breaths/min): 18 Weight (lbs): 250 Blood Pressure (mmHg): 168/73 Source: Stated Capillary Blood Glucose (mg/dl): 89 Body Mass Index (BMI): 36.9 Reference Range: 80 - 120 mg / dl Notes glucose per pt report  yesterday am Electronic Signature(s) Signed: 11/19/2020 1:41:00 PM By: Teresa Gouty RN, BSN Entered By: [name] on 11/19/2020 [place]

## 2020-11-26 ENCOUNTER — Ambulatory Visit: Payer: Medicaid Other | Admitting: Cardiology

## 2020-11-26 ENCOUNTER — Encounter (HOSPITAL_BASED_OUTPATIENT_CLINIC_OR_DEPARTMENT_OTHER): Payer: Medicaid Other | Admitting: Internal Medicine

## 2020-11-26 ENCOUNTER — Other Ambulatory Visit: Payer: Self-pay

## 2020-11-26 ENCOUNTER — Encounter: Payer: Self-pay | Admitting: Cardiology

## 2020-11-26 VITALS — BP 180/95 | HR 90 | Temp 98.2°F | Resp 16 | Ht 69.0 in | Wt 244.0 lb

## 2020-11-26 DIAGNOSIS — I10 Essential (primary) hypertension: Secondary | ICD-10-CM

## 2020-11-26 DIAGNOSIS — E11621 Type 2 diabetes mellitus with foot ulcer: Secondary | ICD-10-CM

## 2020-11-26 NOTE — Progress Notes (Signed)
[name], [name] ([id number]) Visit Report for 11/26/2020 Chief Complaint Document Details Patient Name: Date of Service: [name] Teresa E. 11/26/2020 12:45 PM Medical Record Number: [id number] Patient Account Number: [id number] Date of Birth/Sex: Treating RN: [birth year] (50 y.o. [name] Primary Care Provider: [name] Other Clinician: Referring Provider: Treating Provider/Extender: [name] in Treatment: 1 Information Obtained from: Patient Chief Complaint Right foot wound Electronic Signature(s) Signed: 11/26/2020 1:40:25 PM By: Kalman Shan DO Entered By: Kalman Shan on 11/26/2020 13:32:30 -------------------------------------------------------------------------------- Debridement Details Patient Name: Date of Service: [name] Teresa E. 11/26/2020 12:45 PM Medical Record Number: [id number] Patient Account Number: [id number] Date of Birth/Sex: Treating RN: [name] (50 y.o. [name], [name] Primary Care Provider: [name] Other Clinician: Referring Provider: Treating Provider/Extender: [name] in Treatment: 1 Debridement Performed for Assessment: Wound #1 Right T Great oe Performed By: Physician Kalman Shan, DO Debridement Type: Debridement Severity of Tissue Pre Debridement: Fat layer exposed Level of Consciousness (Pre-procedure): Awake and Alert Pre-procedure Verification/Time Out Yes - 13:00 Taken: Start Time: 13:01 Pain Control: Lidocaine 4% T opical Solution T Area Debrided (L x W): otal 2 (cm) x 2 (cm) = 4 (cm) Tissue and other material debrided: Viable, Non-Viable, Callus, Subcutaneous, Skin: Dermis , Skin: Epidermis, Fibrin/Exudate Level: Skin/Subcutaneous Tissue Debridement Description: Excisional Instrument: Blade, Curette Bleeding: Minimum Hemostasis Achieved: Pressure End Time: 13:06 Procedural Pain: 0 Post Procedural Pain: 0 Response to  Treatment: Procedure was tolerated well Level of Consciousness (Post- Awake and Alert procedure): Post Debridement Measurements of Total Wound Length: (cm) 0.4 Width: (cm) 0.3 Depth: (cm) 0.3 Volume: (cm) 0.028 Character of Wound/Ulcer Post Debridement: Improved Severity of Tissue Post Debridement: Fat layer exposed Post Procedure Diagnosis Same as Pre-procedure Electronic Signature(s) Signed: 11/26/2020 1:40:25 PM By: Kalman Shan DO Signed: 11/26/2020 5:27:07 PM By: Deon Pilling Entered By: Deon Pilling on 11/26/2020 13:06:40 -------------------------------------------------------------------------------- HPI Details Patient Name: Date of Service: [name] Teresa E. 11/26/2020 12:45 PM Medical Record Number: [id number] Patient Account Number: [id number] Date of Birth/Sex: Treating RN: [name] (50 y.o. [name] Primary Care Provider: [name] Other Clinician: Referring Provider: Treating Provider/Extender: [name] in Treatment: 1 History of Present Illness HPI Description: [name] is a 50 year old female with past medical history of insulin-dependent type 2 diabetes, previous left pontine CVA, essential hypertension that presents to the clinic today for evaluation of her right foot wound. She states that in December 2021 she had cut open her right great toe on a door step . It became infected and she was treated with antibiotics. She started following with podiatry in February and has been using Betadine daily on the wound. She is also using a surgical [name] to help with offloading. She has diabetic neuropathy and does not report pain to the wound. She denies any purulent drainage, fever/chills or increased warmth or erythema to the foot. 4/21; patient has been using silver alginate every other day with dressing changes. She denies any purulent drainage, fever/chills or increased warmth or erythema to the foot.  She does however notice an odor. Electronic Signature(s) Signed: 11/26/2020 1:40:25 PM By: Kalman Shan DO Entered By: Kalman Shan on 11/26/2020 13:33:12 -------------------------------------------------------------------------------- Physical Exam Details Patient Name: Date of Service: [name] Teresa E. 11/26/2020 12:45 PM Medical Record Number: [id number] Patient Account Number: [id number] Date of Birth/Sex: Treating RN: [name] (50 y.o. [name] Primary Care Provider: [name] Other Clinician: Referring Provider:  Treating Provider/Extender: [name] in Treatment: 1 Notes Right hallux ulcer, plantar: Callus throughout with small opening. Post debridement there is undermining noted to the wound bed. There is no increased warmth or erythema to the foot. No swelling noted. No purulent drainage noted. Odor was present Electronic Signature(s) Signed: 11/26/2020 1:40:25 PM By: Kalman Shan DO Entered By: Kalman Shan on 11/26/2020 13:34:16 -------------------------------------------------------------------------------- Physician Orders Details Patient Name: Date of Service: [name] Teresa E. 11/26/2020 12:45 PM Medical Record Number: [id number] Patient Account Number: [id number] Date of Birth/Sex: Treating RN: [birth year] (50 y.o. [name] Primary Care Provider: [name] Other Clinician: Referring Provider: Treating Provider/Extender: [name] in Treatment: 1 Verbal / Phone Orders: No Diagnosis Coding ICD-10 Coding Code Description E11.621 Type 2 diabetes mellitus with foot ulcer I63.9 Cerebral infarction, unspecified I10 Essential (primary) hypertension Follow-up Appointments ppointment in 1 week. - Tuesday 12/01/2020 Return A Bathing/ Shower/ Hygiene May shower and wash wound with soap and water. - on days when dressing is changed. Off-Loading Open  toe surgical [name] to: - right foot, felt donut to [name] to offload. Continue to wear surgical [name] while walking and standing. ensure no pressure to right great toe. Wound Treatment Wound #1 - T Great oe Wound Laterality: Right Prim Dressing: KerraCel Ag Gelling Fiber Dressing, 2x2 in (silver alginate) (Generic) Every Other Day/15 Days ary Discharge Instructions: **lightly pack into wound bed.*** Apply silver alginate to wound bed as instructed Secondary Dressing: Woven Gauze Sponges 2x2 in (Generic) Every Other Day/15 Days Discharge Instructions: Apply over primary dressing as directed. Secondary Dressing: Optifoam Non-Adhesive Dressing, 4x4 in Every Other Day/15 Days Discharge Instructions: ***Apply foam donut*** Apply over primary dressing as directed. Secured With: Child psychotherapist, Sterile 2x75 (in/in) (Generic) Every Other Day/15 Days Discharge Instructions: Secure with stretch gauze as directed. Secured With: Paper Tape, 2x10 (in/yd) (Generic) Every Other Day/15 Days Discharge Instructions: Secure dressing with tape as directed. Radiology MRI, right great toe with and without contrast - MRI of right great toe with and without contrast related to non-healing diabetic foot ulcer looking for infection. 10/09/2020 x-ray negative. CPT code - (ICD10 E11.621 - Type 2 diabetes mellitus with foot ulcer) Electronic Signature(s) Signed: 11/26/2020 1:40:25 PM By: Kalman Shan DO Entered By: Kalman Shan on 11/26/2020 13:35:31 Prescription 11/26/2020 -------------------------------------------------------------------------------- [place], [place] Kalman Shan DO Patient Name: Provider: [birth year] [id number] Date of Birth: NPI#: [id number] Sex: DEA#: [phone] Phone #: License #: [place] Patient Address: [place] [id number] [place], [place] [address] [place], [place]  [address] [phone] Allergies No Known Allergies Provider's Orders MRI, right great toe with and without contrast - ICD10: E11.621 - MRI of right great toe with and without contrast related to non-healing diabetic foot ulcer looking for infection. 10/09/2020 x-ray negative. CPT code Hand Signature: Date(s): Electronic Signature(s) Signed: 11/26/2020 1:40:25 PM By: Kalman Shan DO Entered By: Kalman Shan on 11/26/2020 13:40:01 -------------------------------------------------------------------------------- Problem List Details Patient Name: Date of Service: [name] Teresa E. 11/26/2020 12:45 PM Medical Record Number: [id number] Patient Account Number: [id number] Date of Birth/Sex: Treating RN: [name] (50 y.o. [name] Primary Care Provider: [name] Other Clinician: Referring Provider: Treating Provider/Extender: [name] in Treatment: 1 Active Problems ICD-10 Encounter Code Description Active Date MDM Diagnosis E11.621 Type 2 diabetes mellitus with foot ulcer 11/19/2020 No Yes I63.9 Cerebral infarction, unspecified 11/19/2020 No Yes I10  Essential (primary) hypertension 11/19/2020 No Yes Inactive Problems Resolved Problems Electronic Signature(s) Signed: 11/26/2020 1:40:25 PM By: Kalman Shan DO Entered By: Kalman Shan on 11/26/2020 13:32:19 -------------------------------------------------------------------------------- Progress Note Details Patient Name: Date of Service: [name] Teresa E. 11/26/2020 12:45 PM Medical Record Number: [id number] Patient Account Number: [id number] Date of Birth/Sex: Treating RN: [name] (50 y.o. [name] Primary Care Provider: [name] Other Clinician: Referring Provider: Treating Provider/Extender: [name] in Treatment: 1 Subjective Chief Complaint Information obtained from Patient Right foot wound History of  Present Illness (HPI) [name] is a 50 year old female with past medical history of insulin-dependent type 2 diabetes, previous left pontine CVA, essential hypertension that presents to the clinic today for evaluation of her right foot wound. She states that in December 2021 she had cut open her right great toe on a door step . It became infected and she was treated with antibiotics. She started following with podiatry in February and has been using Betadine daily on the wound. She is also using a surgical [name] to help with offloading. She has diabetic neuropathy and does not report pain to the wound. She denies any purulent drainage, fever/chills or increased warmth or erythema to the foot. 4/21; patient has been using silver alginate every other day with dressing changes. She denies any purulent drainage, fever/chills or increased warmth or erythema to the foot. She does however notice an odor. Patient History Information obtained from Patient. Family History Cancer - Maternal Grandparents, Diabetes - Mother,Father,Maternal Grandparents,Paternal Grandparents, Heart Disease - Mother,Father,Maternal Grandparents,Paternal Grandparents,Siblings, Hypertension - Mother,Father, Kidney Disease - Maternal Grandparents, Stroke - Maternal Grandparents,Mother, No family history of Hereditary Spherocytosis, Lung Disease, Seizures, Thyroid Problems, Tuberculosis. Social History Former smoker - quit 3-4 yrs ago, Marital Status - Single, Alcohol Use - Rarely, Drug Use - Current History - marijuana, Caffeine Use - Rarely. Medical History Eyes Denies history of Cataracts, Glaucoma, Optic Neuritis Hematologic/Lymphatic Patient has history of Anemia Respiratory Patient has history of Asthma, Sleep Apnea - CPAP Cardiovascular Patient has history of Coronary Artery Disease, Hypertension Endocrine Patient has history of Type II Diabetes Denies history of Type I Diabetes Genitourinary Denies history  of End Stage Renal Disease Integumentary (Skin) Denies history of History of Burn Musculoskeletal Patient has history of Osteoarthritis Neurologic Patient has history of Neuropathy Oncologic Denies history of Received Chemotherapy, Received Radiation Psychiatric Patient has history of Confinement Anxiety Denies history of Anorexia/bulimia Hospitalization/Surgery History - achilles tendon rupture right. Medical A Surgical History Notes nd Constitutional Symptoms (General Health) vitamin D deficiency Eyes hypertensive retinopathy Cardiovascular hyperlipidemia Endocrine left thyroid nodule Genitourinary CKD Neurologic stroke Objective Constitutional Vitals Time Taken: 12:40 PM, Height: 69 in, Weight: 250 lbs, BMI: 36.9, Temperature: 98.6 F, Pulse: 88 bpm, Respiratory Rate: 16 breaths/min, Blood Pressure: 173/83 mmHg. Integumentary (Hair, Skin) Wound #1 status is Open. Original cause of wound was Puncture. The date acquired was: 07/09/2020. The wound has been in treatment 1 weeks. The wound is located on the Right T Great. The wound measures 0.4cm length x 0.3cm width x 0.4cm depth; 0.094cm^2 area and 0.038cm^3 volume. There is Fat Layer oe (Subcutaneous Tissue) exposed. There is no tunneling or undermining noted. There is a small amount of serosanguineous drainage noted. The wound margin is well defined and not attached to the wound base. There is large (67-100%) red, pink granulation within the wound bed. There is no necrotic tissue within the wound bed. General Notes: Callused periwound Assessment Active Problems ICD-10  Type 2 diabetes mellitus with foot ulcer Cerebral infarction, unspecified Essential (primary) hypertension [name] right great toe has significant callus that was debrided in office today. The wound bed has undermining that was not noted at last clinic visit. This is likely because the callus was hiding it. It is difficult to remove this callus and I am  only able to do it a little bit at a time each time I see her. It still does not probe to bone. I would eventually like to put her in a total contact cast however I need to make sure there is no osteomyelitis. She had an x-ray done that did not show this however I will need an MRI to confirm. There is good granulation tissue postdebridement and I recommended she continue to use silver alginate every other day. We also discussed offloading and will give her another offloading pad today. Procedures Wound #1 Pre-procedure diagnosis of Wound #1 is a Diabetic Wound/Ulcer of the Lower Extremity located on the Right T Great .Severity of Tissue Pre Debridement is: oe Fat layer exposed. There was a Excisional Skin/Subcutaneous Tissue Debridement with a total area of 4 sq cm performed by Kalman Shan, DO. With the following instrument(s): Blade, and Curette to remove Viable and Non-Viable tissue/material. Material removed includes Callus, Subcutaneous Tissue, Skin: Dermis, Skin: Epidermis, and Fibrin/Exudate after achieving pain control using Lidocaine 4% T opical Solution. A time out was conducted at 13:00, prior to the start of the procedure. A Minimum amount of bleeding was controlled with Pressure. The procedure was tolerated well with a pain level of 0 throughout and a pain level of 0 following the procedure. Post Debridement Measurements: 0.4cm length x 0.3cm width x 0.3cm depth; 0.028cm^3 volume. Character of Wound/Ulcer Post Debridement is improved. Severity of Tissue Post Debridement is: Fat layer exposed. Post procedure Diagnosis Wound #1: Same as Pre-Procedure Plan Follow-up Appointments: Return Appointment in 1 week. - Tuesday 12/01/2020 Bathing/ Shower/ Hygiene: May shower and wash wound with soap and water. - on days when dressing is changed. Off-Loading: Open toe surgical [name] to: - right foot, felt donut to [name] to offload. Continue to wear surgical [name] while walking and standing.  ensure no pressure to right great toe. Radiology ordered were: MRI, right great toe with and without contrast - MRI of right great toe with and without contrast related to non-healing diabetic foot ulcer looking for infection. 10/09/2020 x-ray negative. CPT code WOUND #1: - T Great Wound Laterality: Right oe Prim Dressing: KerraCel Ag Gelling Fiber Dressing, 2x2 in (silver alginate) (Generic) Every Other Day/15 Days ary Discharge Instructions: **lightly pack into wound bed.*** Apply silver alginate to wound bed as instructed Secondary Dressing: Woven Gauze Sponges 2x2 in (Generic) Every Other Day/15 Days Discharge Instructions: Apply over primary dressing as directed. Secondary Dressing: Optifoam Non-Adhesive Dressing, 4x4 in Every Other Day/15 Days Discharge Instructions: ***Apply foam donut*** Apply over primary dressing as directed. Secured With: Child psychotherapist, Sterile 2x75 (in/in) (Generic) Every Other Day/15 Days Discharge Instructions: Secure with stretch gauze as directed. Secured With: Paper Tape, 2x10 (in/yd) (Generic) Every Other Day/15 Days Discharge Instructions: Secure dressing with tape as directed. 1. MRI of the right foot 2. Silver alginate every other day 3. Follow-up in 1 week 4. In office sharp debridement Electronic Signature(s) Signed: 11/26/2020 1:40:25 PM By: Kalman Shan DO Entered By: Kalman Shan on 11/26/2020 13:38:41 -------------------------------------------------------------------------------- HxROS Details Patient Name: Date of Service: [name] Teresa E. 11/26/2020 12:45 PM Medical Record Number: [id number]  Patient Account Number: [id number] Date of Birth/Sex: Treating RN: [name] (50 y.o. [name] Primary Care Provider: [name] Other Clinician: Referring Provider: Treating Provider/Extender: [name] in Treatment: 1 Information Obtained From Patient Constitutional  Symptoms (General Health) Medical History: Past Medical History Notes: vitamin D deficiency Eyes Medical History: Negative for: Cataracts; Glaucoma; Optic Neuritis Past Medical History Notes: hypertensive retinopathy Hematologic/Lymphatic Medical History: Positive for: Anemia Respiratory Medical History: Positive for: Asthma; Sleep Apnea - CPAP Cardiovascular Medical History: Positive for: Coronary Artery Disease; Hypertension Past Medical History Notes: hyperlipidemia Endocrine Medical History: Positive for: Type II Diabetes Negative for: Type I Diabetes Past Medical History Notes: left thyroid nodule Time with diabetes: since age 21 Blood sugar tested every day: Yes Tested : 2-3 times per day Genitourinary Medical History: Negative for: End Stage Renal Disease Past Medical History Notes: CKD Integumentary (Skin) Medical History: Negative for: History of Burn Musculoskeletal Medical History: Positive for: Osteoarthritis Neurologic Medical History: Positive for: Neuropathy Past Medical History Notes: stroke Oncologic Medical History: Negative for: Received Chemotherapy; Received Radiation Psychiatric Medical History: Positive for: Confinement Anxiety Negative for: Anorexia/bulimia Immunizations Pneumococcal Vaccine: Received Pneumococcal Vaccination: No Implantable Devices No devices added Hospitalization / Surgery History Type of Hospitalization/Surgery achilles tendon rupture right Family and Social History Cancer: Yes - Maternal Grandparents; Diabetes: Yes - Mother,Father,Maternal Grandparents,Paternal Grandparents; Heart Disease: Yes - Mother,Father,Maternal Grandparents,Paternal Grandparents,Siblings; Hereditary Spherocytosis: No; Hypertension: Yes - Mother,Father; Kidney Disease: Yes - Maternal Grandparents; Lung Disease: No; Seizures: No; Stroke: Yes - Maternal Grandparents,Mother; Thyroid Problems: No; Tuberculosis: No; Former smoker - quit 3-4  yrs ago; Marital Status - Single; Alcohol Use: Rarely; Drug Use: Current History - marijuana; Caffeine Use: Rarely; Financial Concerns: No; Food, Clothing or Shelter Needs: No; Support System Lacking: No; Transportation Concerns: No Electronic Signature(s) Signed: 11/26/2020 1:40:25 PM By: Kalman Shan DO Signed: 11/26/2020 5:27:07 PM By: Deon Pilling Entered By: Kalman Shan on 11/26/2020 13:33:21 -------------------------------------------------------------------------------- SuperBill Details Patient Name: Date of Service: [name] Teresa E. 11/26/2020 Medical Record Number: [id number] Patient Account Number: [id number] Date of Birth/Sex: Treating RN: [birth year] (50 y.o. [name] Primary Care Provider: [name] Other Clinician: Referring Provider: Treating Provider/Extender: [name] in Treatment: 1 Diagnosis Coding ICD-10 Codes Code Description [phone] Type 2 diabetes mellitus with foot ulcer I63.9 Cerebral infarction, unspecified I10 Essential (primary) hypertension Facility Procedures CPT4 Code: [id number] Description: [id number] - DEB SUBQ TISSUE 20 SQ CM/< ICD-10 Diagnosis Description E11.621 Type 2 diabetes mellitus with foot ulcer Modifier: Quantity: 1 Electronic Signature(s) Signed: 11/26/2020 1:40:25 PM By: Kalman Shan DO Entered By: Kalman Shan on 11/26/2020 13:39:55

## 2020-11-26 NOTE — Progress Notes (Signed)
Patient referred by [name], MD for hypertensive heart disease  Subjective:   [name], female    DOB: [birth year], 50 y.o.   MRN: [id number]   Chief Complaint  Patient presents with  . Hypertension  . Follow-up    6 week    50 y.o. African-American female with hypertension, uncontrolled type 2 diabetes mellitus, hyperlipidemia, tobacco abuse, recurrent strokes.  She continues to struggle with hypertension control. She reports compliance. She denies chest pain, shortness of breath, palpitations, leg edema, orthopnea, PND, TIA/syncope.    Current Outpatient Medications on File Prior to Visit  Medication Sig Dispense Refill  . Accu-Chek FastClix Lancets MISC 4 (four) times daily.    Marland Kitchen ACCU-CHEK GUIDE test strip 1 each by Other route See admin instructions. for testing 100 each 2  . albuterol (VENTOLIN HFA) 108 (90 Base) MCG/ACT inhaler Inhale into the lungs every 6 (six) hours as needed for wheezing or shortness of breath.    Marland Kitchen amLODipine-benazepril (LOTREL) 10-40 MG capsule Take 1 capsule by mouth daily. 90 capsule 2  . aspirin 81 MG EC tablet Take 1 tablet (81 mg total) by mouth daily. 90 tablet 2  . blood glucose meter kit and supplies KIT Dispense based on patient and insurance preference. Use up to four times daily as directed. (FOR ICD-9 250.00, 250.01). 1 each 0  . carvedilol (COREG) 25 MG tablet Take 1 tablet (25 mg total) by mouth 2 (two) times daily with a meal. 180 tablet 2  . Cholecalciferol (VITAMIN D3) 25 MCG (1000 UT) CAPS Take 1 capsule by mouth daily.    . folic acid (FOLVITE) 1 MG tablet Take 1 mg by mouth daily.    . furosemide (LASIX) 20 MG tablet Take 2 tablets (40 mg total) by mouth 2 (two) times daily. Take in the morning and afternoon 90 tablet 3  . glipiZIDE (GLUCOTROL) 10 MG tablet Take 10 mg by mouth daily.    . insulin aspart protamine- aspart (NOVOLOG MIX 70/30) (70-30) 100 UNIT/ML injection Inject 0.15 mLs (15 Units total) into the  skin 2 (two) times daily with a meal. (Patient taking differently: Inject 15-17 Units into the skin See admin instructions. Inject 15 units subcutaneous in the morning, then inject 17 units subcutaneous in the evening) 10 mL 11  . Insulin Syringes, Disposable, V-672 1 ML MISC 1 application by Does not apply route 2 (two) times a day. 100 each 0  . ipratropium-albuterol (DUONEB) 0.5-2.5 (3) MG/3ML SOLN Take 3 mLs by nebulization every 4 (four) hours as needed. 360 mL 0  . isosorbide-hydrALAZINE (BIDIL) 20-37.5 MG tablet Take 2 tablets by mouth 3 (three) times daily. 540 tablet 3  . metFORMIN (GLUCOPHAGE) 1000 MG tablet Take 1,000 mg by mouth 2 (two) times daily.    . rosuvastatin (CRESTOR) 10 MG tablet Take 1 tablet (10 mg total) by mouth daily. 90 tablet 3  . vitamin B-12 (CYANOCOBALAMIN) 1000 MCG tablet Take 1,000 mcg by mouth daily.     No current facility-administered medications on file prior to visit.    Cardiovascular studies:  EKG 11/26/2020: Sinus rhythm 88 bpm Frequent PACs  Left atrial enlargement Old anteroseptal infarct   Echocardiogram 06/25/2020:  1. Left ventricular ejection fraction, by estimation, is 60 to 65%. The  left ventricle has normal function. The left ventricle has no regional  wall motion abnormalities. There is severe left ventricular hypertrophy.  Left ventricular diastolic parameters  were normal.  2. Right ventricular systolic function is  normal. The right ventricular  size is normal. There is normal pulmonary artery systolic pressure. The  estimated right ventricular systolic pressure is 78.2 mmHg.  3. A small pericardial effusion is present.  4. The mitral valve is normal in structure. No evidence of mitral valve  regurgitation.  5. The aortic valve was not well visualized. Aortic valve regurgitation  is mild. No aortic stenosis is present.  6. The inferior vena cava is dilated in size with >50% respiratory  variability, suggesting right  atrial pressure of 8 mmHg.   Event monitor 09/17/2019 - 10/16/2019: Diagnostic time: 94%  Dominant rhythm: Sinus. HR 66-112 bpm. Avg HR 80 bpm. 6 episodes of NSVT up to 9 beats seen.  No atrial fibrillation/atrial flutter/SVT/high grade AV block, sinus pause >3sec noted. Symptoms reported: None  MRI brain 08/20/2019: - Right frontal parasagittal and anterior corpus callosum lesion which is hyperintense on DWI, slightly hypointense on ADC map, hyperintense on T2 and FLAIR views, with heterogeneous enhancement.  Considerations could include subacute infarction, autoimmune, inflammatory or neoplastic etiologies. This is a new finding compared to 02/11/2019. - Mild scattered periventricular, subcortical, pontine chronic small vessel ischemic disease.  CTA head/neck 02/12/2019: 1. Diffuse distal branch vessel irregularity throughout the circle-of-Willis without a significant proximal stenosis or occlusion. 2. Atherosclerotic changes are evident within the cavernous internal carotid arteries and right greater than left M1 segments with diffuse vessel irregularity and mild narrowing of less than 50% relative to the more distal vessels. 3. Tortuosity of the cervical internal carotid arteries and vertebral arteries bilaterally without significant stenosis. This is nonspecific, but often seen in the setting of chronic hypertension. 4. Brainstem infarct and white matter parenchymal changes noted on MRI are not discernible by CT.  MRI brain 02/11/2019: 1. Patchy small volume acute ischemic nonhemorrhagic infarct involving the left lateral midbrain/pons. 2. Underlying mild chronic microvascular ischemic disease.   Recent labs: 06/25/2020: Glucose 136, BUN/Cr 18/1.49. EGFR 43. Na/K 139/3.5.  H/H 7.7/26.6. MCV 72.7. Platelets 249 Results for [name], [name] (MRN [id number]) as of 07/15/2020 16:09  Ref. Range 06/24/2020 21:30 06/24/2020 23:14  B Natriuretic Peptide Latest Ref Range: 0.0 - 100.0 pg/mL  427.9 (H)   Troponin I (High Sensitivity) Latest Ref Range: <18 ng/L 17 18 (H)     11/28/2019: Glucose 177, BUN/Cr 33/2.33. EGFR 28. Na/K 138/4.0. Rest of the CMP normal H/H 11/32. MCV 86. Platelets 254 Chol 167, TG 287, HDL 39, LDL 81  03/12/2019: Glucose 170.  BUN/creatinine 26/1.3.  EGFR 57.  NA/K1 39/4.2. H/H 12.6/37.5.  MCV 91.  Platelets 245.  Normal iron studies. Hemoglobin A1c 8.5%. Cholesterol 106, triglycerides 103, HDL 38, LDL 47   Review of Systems  Cardiovascular: Positive for dyspnea on exertion. Negative for chest pain, leg swelling, palpitations and syncope.  Genitourinary: Negative for dysuria.  Neurological: Positive for numbness.       Foot drop  All other systems reviewed and are negative.        Vitals:   11/26/20 0955 11/26/20 1004  BP: (!) 181/93 (!) 180/95  Pulse: 95 90  Resp: 16   Temp: 98.2 F (36.8 C)   SpO2: 100%      Objective:   Physical Exam Vitals and nursing note reviewed.  Constitutional:      General: She is not in acute distress. HENT:     Head: Normocephalic and atraumatic.  Neck:     Vascular: No JVD.  Cardiovascular:     Rate and Rhythm: Normal rate and regular  rhythm.     Pulses: Intact distal pulses.     Heart sounds: Murmur heard.   Early systolic murmur is present with a grade of 2/6 at the lower right sternal border.   Pulmonary:     Effort: Pulmonary effort is normal.     Breath sounds: Normal breath sounds. No rales.  Neurological:     Cranial Nerves: No cranial nerve deficit.           Assessment & Recommendations:   50 y.o. 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 Reports compliance.  Will refer to advanced hypertension clinic.  History of stroke: No Afib on event monitor.  Continue Aspirin, statin.  Type  2 diabetes mellitus: Uncontrolled.  Management as per PCP.  F/u in 6 months  Zelene Barga Esther Hardy, MD [facility] Pager: [phone] Office: [phone] If no answer Cell [phone]

## 2020-11-27 ENCOUNTER — Ambulatory Visit: Payer: Medicaid Other | Admitting: Podiatry

## 2020-11-30 ENCOUNTER — Other Ambulatory Visit: Payer: Self-pay | Admitting: Internal Medicine

## 2020-11-30 ENCOUNTER — Other Ambulatory Visit (HOSPITAL_COMMUNITY): Payer: Self-pay | Admitting: Internal Medicine

## 2020-12-01 ENCOUNTER — Other Ambulatory Visit: Payer: Self-pay | Admitting: Internal Medicine

## 2020-12-01 ENCOUNTER — Encounter (HOSPITAL_BASED_OUTPATIENT_CLINIC_OR_DEPARTMENT_OTHER): Payer: Medicaid Other | Admitting: Internal Medicine

## 2020-12-01 ENCOUNTER — Other Ambulatory Visit: Payer: Self-pay

## 2020-12-01 ENCOUNTER — Other Ambulatory Visit (HOSPITAL_COMMUNITY): Payer: Self-pay | Admitting: Internal Medicine

## 2020-12-01 DIAGNOSIS — E11621 Type 2 diabetes mellitus with foot ulcer: Secondary | ICD-10-CM | POA: Diagnosis not present

## 2020-12-01 DIAGNOSIS — I639 Cerebral infarction, unspecified: Secondary | ICD-10-CM

## 2020-12-01 DIAGNOSIS — I10 Essential (primary) hypertension: Secondary | ICD-10-CM

## 2020-12-01 DIAGNOSIS — E08621 Diabetes mellitus due to underlying condition with foot ulcer: Secondary | ICD-10-CM

## 2020-12-01 NOTE — Progress Notes (Signed)
[name], [name] ([id number]) Visit Report for 11/26/2020 Arrival Information Details Patient Name: Date of Service: [name] 11/26/2020 12:45 PM Medical Record Number: [id number] Patient Account Number: [id number] Date of Birth/Sex: Treating RN: 24-Aug-1970 (50 y.o. [name] Primary Care Niclas Markell: [name] Other Clinician: Referring Kainalu Heggs: Treating Hajar Penninger/Extender: [name] in Treatment: 1 Visit Information History Since Last Visit Added or deleted any medications: No Patient Arrived: Ambulatory Any new allergies or adverse reactions: No Arrival Time: 12:36 Had a fall or experienced change in No Transfer Assistance: None activities of daily living that may affect Patient Identification Verified: Yes risk of falls: Secondary Verification Process Completed: Yes Signs or symptoms of abuse/neglect since No Patient Requires Transmission-Based Precautions: No last visito Patient Has Alerts: No Hospitalized since last visit: No Implantable device outside of the clinic No excluding cellular tissue based products placed in the center since last visit: Has Dressing in Place as Prescribed: Yes Has Footwear/Offloading in Place as Yes Prescribed: Right: Surgical Shoe with Pressure Relief Insole Pain Present Now: No Electronic Signature(s) Signed: 11/26/2020 5:08:10 PM By: Lorrin Jackson Entered By: Lorrin Jackson on 11/26/2020 12:40:50 -------------------------------------------------------------------------------- Encounter Discharge Information Details Patient Name: Date of Service: [name] 11/26/2020 12:45 PM Medical Record Number: [id number] Patient Account Number: [id number] Date of Birth/Sex: Treating RN: 12/27/1970 (50 y.o. [name] Primary Care Rexanna Louthan: [name] Other Clinician: Referring Jemya Depierro: Treating Kimberlie Csaszar/Extender: [name] in  Treatment: 1 Encounter Discharge Information Items Post Procedure Vitals Discharge Condition: Stable Temperature (F): 98.6 Ambulatory Status: Ambulatory Pulse (bpm): 88 Discharge Destination: Home Respiratory Rate (breaths/min): 18 Transportation: Private Auto Blood Pressure (mmHg): 173/83 Accompanied By: self Schedule Follow-up Appointment: Yes Clinical Summary of Care: Patient Declined Electronic Signature(s) Signed: 11/27/2020 5:10:31 PM By: Baruch Gouty RN, BSN Entered By: Baruch Gouty on 11/26/2020 13:35:08 -------------------------------------------------------------------------------- Lower Extremity Assessment Details Patient Name: Date of Service: [name] 11/26/2020 12:45 PM Medical Record Number: [id number] Patient Account Number: [id number] Date of Birth/Sex: Treating RN: 16-Feb-1971 (50 y.o. [name] Primary Care Dantavious Snowball: [name] Other Clinician: Referring Tehani Mersman: Treating Ryota Treece/Extender: [name] in Treatment: 1 Edema Assessment Assessed: [name]: No] [Right: Yes] Edema: [Left: N] [Right: o] Calf Left: Right: Point of Measurement: From Medial Instep 34 cm Ankle Left: Right: Point of Measurement: From Medial Instep 25 cm Vascular Assessment Pulses: Dorsalis Pedis Palpable: [Right:Yes] Electronic Signature(s) Signed: 11/26/2020 5:08:10 PM By: Lorrin Jackson Entered By: Lorrin Jackson on 11/26/2020 12:47:44 -------------------------------------------------------------------------------- Multi Wound Chart Details Patient Name: Date of Service: [name] 11/26/2020 12:45 PM Medical Record Number: [id number] Patient Account Number: [id number] Date of Birth/Sex: Treating RN: 09-15-1970 (50 y.o. [name] Primary Care Savon Bordonaro: [name] Other Clinician: Referring Zuleica Seith: Treating Cayleb Jarnigan/Extender: [name] in Treatment:  1 Vital Signs Height(in): 36 Pulse(bpm): 75 Weight(lbs): 250 Blood Pressure(mmHg): 173/83 Body Mass Index(BMI): 37 Temperature(F): 98.6 Respiratory Rate(breaths/min): 16 Photos: [1:No Photos Right T Great oe] [N/A:N/A N/A] Wound Location: [1:Puncture] [N/A:N/A] Wounding Event: [1:Diabetic Wound/Ulcer of the Lower] [N/A:N/A] Primary Etiology: [1:Extremity Anemia, Asthma, Sleep Apnea,] [N/A:N/A] Comorbid History: [1:Coronary Artery Disease, Hypertension, Type II Diabetes, Osteoarthritis, Neuropathy, Confinement Anxiety 07/09/2020] [N/A:N/A] Date Acquired: [1:1] [N/A:N/A] Weeks of Treatment: [1:Open] [N/A:N/A] Wound Status: [1:0.4x0.3x0.4] [N/A:N/A] Measurements L x W x D (cm) [1:0.094] [N/A:N/A] A (cm) : rea [1:0.038] [N/A:N/A] Volume (cm) : [1:25.40%] [N/A:N/A] % Reduction in A [1:rea: 56.80%] [N/A:N/A] % Reduction in Volume: [1:Grade  1] [N/A:N/A] Classification: [1:Small] [N/A:N/A] Exudate A mount: [1:Serosanguineous] [N/A:N/A] Exudate Type: [1:red, brown] [N/A:N/A] Exudate Color: [1:Well defined, not attached] [N/A:N/A] Wound Margin: [1:Large (67-100%)] [N/A:N/A] Granulation A mount: [1:Red, Pink] [N/A:N/A] Granulation Quality: [1:None Present (0%)] [N/A:N/A] Necrotic A mount: [1:Fat Layer (Subcutaneous Tissue): Yes N/A] Exposed Structures: [1:Fascia: No Tendon: No Muscle: No Joint: No Bone: No Small (1-33%)] [N/A:N/A] Epithelialization: [1:Debridement - Excisional] [N/A:N/A] Debridement: Pre-procedure Verification/Time Out 13:00 [N/A:N/A] Taken: [1:Lidocaine 4% T opical Solution] [N/A:N/A] Pain Control: [1:Callus, Subcutaneous] [N/A:N/A] Tissue Debrided: [1:Skin/Subcutaneous Tissue] [N/A:N/A] Level: [1:4] [N/A:N/A] Debridement A (sq cm): [1:rea Blade, Curette] [N/A:N/A] Instrument: [1:Minimum] [N/A:N/A] Bleeding: [1:Pressure] [N/A:N/A] Hemostasis A chieved: [1:0] [N/A:N/A] Procedural Pain: [1:0] [N/A:N/A] Post Procedural Pain: [1:Procedure was tolerated well]  [N/A:N/A] Debridement Treatment Response: [1:0.4x0.3x0.3] [N/A:N/A] Post Debridement Measurements L x W x D (cm) [1:0.028] [N/A:N/A] Post Debridement Volume: (cm) [1:Callused periwound] [N/A:N/A] Assessment Notes: [1:Debridement] [N/A:N/A] Treatment Notes Electronic Signature(s) Signed: 11/26/2020 1:40:25 PM By: Kalman Shan DO Signed: 11/26/2020 5:27:07 PM By: Deon Pilling Entered By: Kalman Shan on 11/26/2020 13:32:24 -------------------------------------------------------------------------------- Multi-Disciplinary Care Plan Details Patient Name: Date of Service: [name] 11/26/2020 12:45 PM Medical Record Number: [id number] Patient Account Number: [id number] Date of Birth/Sex: Treating RN: [birth year] (50 y.o. [name], [name] Primary Care Ethie Curless: [name] Other Clinician: Referring Benji Poynter: Treating Aden Youngman/Extender: [name] in Treatment: 1 Multidisciplinary Care Plan reviewed with physician Active Inactive Abuse / Safety / Falls / Self Care Management Nursing Diagnoses: History of Falls Potential for falls Goals: Patient will not experience any injury related to falls Date Initiated: 11/19/2020 Target Resolution Date: 12/17/2020 Goal Status: Active Interventions: Assess fall risk on admission and as needed Assess impairment of mobility on admission and as needed per policy Notes: Nutrition Nursing Diagnoses: Impaired glucose control: actual or potential Potential for alteratiion in Nutrition/Potential for imbalanced nutrition Goals: Patient/caregiver will maintain therapeutic glucose control Date Initiated: 11/19/2020 Target Resolution Date: 12/17/2020 Goal Status: Active Interventions: Assess patient nutrition upon admission and as needed per policy Provide education on elevated blood sugars and impact on wound healing Provide education on nutrition Treatment Activities: Patient referred to  Primary Care Physician for further nutritional evaluation : 11/19/2020 Notes: Wound/Skin Impairment Nursing Diagnoses: Impaired tissue integrity Knowledge deficit related to ulceration/compromised skin integrity Goals: Patient/caregiver will verbalize understanding of skin care regimen Date Initiated: 11/19/2020 Target Resolution Date: 12/17/2020 Goal Status: Active Ulcer/skin breakdown will have a volume reduction of 30% by week 4 Date Initiated: 11/19/2020 Target Resolution Date: 12/17/2020 Goal Status: Active Interventions: Assess patient/caregiver ability to obtain necessary supplies Assess patient/caregiver ability to perform ulcer/skin care regimen upon admission and as needed Assess ulceration(s) every visit Provide education on ulcer and skin care Treatment Activities: Skin care regimen initiated : 11/19/2020 Topical wound management initiated : 11/19/2020 Notes: Electronic Signature(s) Signed: 11/26/2020 5:27:07 PM By: Deon Pilling Entered By: Deon Pilling on 11/26/2020 12:48:09 -------------------------------------------------------------------------------- Pain Assessment Details Patient Name: Date of Service: Charlton Haws NDA E. 11/26/2020 12:45 PM Medical Record Number: [id number] Patient Account Number: [id number] Date of Birth/Sex: Treating RN: 1971/03/21 (50 y.o. [name] Primary Care Ralph Brouwer: [name] Other Clinician: Referring Moroni Nester: Treating Scot Shiraishi/Extender: [name] in Treatment: 1 Active Problems Location of Pain Severity and Description of Pain Patient Has Paino No Site Locations Pain Management and Medication Current Pain Management: Electronic Signature(s) Signed: 11/26/2020 5:08:10 PM By: Lorrin Jackson Entered By: Lorrin Jackson on 11/26/2020 12:41:28 -------------------------------------------------------------------------------- Patient/Caregiver Education Details Patient Name: Date of  Service: MA  Teresa Norma NDA E. 4/21/2022andnbsp12:45 PM Medical Record Number: [id number] Patient Account Number: [id number] Date of Birth/Gender: Treating RN: 1971/06/28 (50 y.o. [name] Primary Care Physician: [name] Other Clinician: Referring Physician: Treating Physician/Extender: [name] in Treatment: 1 Education Assessment Education Provided To: Patient Education Topics Provided Elevated Blood Sugar/ Impact on Healing: Handouts: Elevated Blood Sugars: How Do They Affect Wound Healing Methods: Explain/Verbal Responses: Reinforcements needed Electronic Signature(s) Signed: 11/26/2020 5:27:07 PM By: Deon Pilling Entered By: Deon Pilling on 11/26/2020 12:48:23 -------------------------------------------------------------------------------- Wound Assessment Details Patient Name: Date of Service: [name] 11/26/2020 12:45 PM Medical Record Number: [id number] Patient Account Number: [id number] Date of Birth/Sex: Treating RN: 05-24-1971 (50 y.o. [name] Primary Care Scott Vanderveer: [name] Other Clinician: Referring Jamaurion Slemmer: Treating Shadi Sessler/Extender: [name] in Treatment: 1 Wound Status Wound Number: 1 Primary Diabetic Wound/Ulcer of the Lower Extremity Etiology: Wound Location: Right T Great oe Wound Open Wounding Event: Puncture Status: Date Acquired: 07/09/2020 Comorbid Anemia, Asthma, Sleep Apnea, Coronary Artery Disease, Weeks Of Treatment: 1 History: Hypertension, Type II Diabetes, Osteoarthritis, Neuropathy, Clustered Wound: No Confinement Anxiety Photos Wound Measurements Length: (cm) 0.4 Width: (cm) 0.3 Depth: (cm) 0.4 Area: (cm) 0.094 Volume: (cm) 0.038 % Reduction in Area: 25.4% % Reduction in Volume: 56.8% Epithelialization: Small (1-33%) Tunneling: No Undermining: No Wound Description Classification: Grade 1 Wound Margin: Well  defined, not attached Exudate Amount: Small Exudate Type: Serosanguineous Exudate Color: red, brown Foul Odor After Cleansing: No Slough/Fibrino No Wound Bed Granulation Amount: Large (67-100%) Exposed Structure Granulation Quality: Red, Pink Fascia Exposed: No Necrotic Amount: None Present (0%) Fat Layer (Subcutaneous Tissue) Exposed: Yes Tendon Exposed: No Muscle Exposed: No Joint Exposed: No Bone Exposed: No Assessment Notes Callused periwound Electronic Signature(s) Signed: 11/26/2020 5:08:10 PM By: Lorrin Jackson Signed: 12/01/2020 2:09:33 PM By: Sandre Kitty Entered By: Sandre Kitty on 11/26/2020 16:57:36 -------------------------------------------------------------------------------- [place] Details Patient Name: Date of Service: [name] 11/26/2020 12:45 PM Medical Record Number: [id number] Patient Account Number: [id number] Date of Birth/Sex: Treating RN: Aug 21, 1970 (50 y.o. [name] Primary Care Dashanti Burr: [name] Other Clinician: Referring Oaklie Durrett: Treating Ruslan Mccabe/Extender: [name] in Treatment: 1 Vital Signs Time Taken: 12:40 Temperature (F): 98.6 Height (in): 69 Pulse (bpm): 88 Weight (lbs): 250 Respiratory Rate (breaths/min): 16 Body Mass Index (BMI): 36.9 Blood Pressure (mmHg): 173/83 Reference Range: 80 - 120 mg / dl Electronic Signature(s) Signed: 11/26/2020 5:08:10 PM By: Lorrin Jackson Entered By: Lorrin Jackson on 11/26/2020 12:41:18

## 2020-12-01 NOTE — Progress Notes (Signed)
Teresa, [name] ([id number]) Visit Report for 12/01/2020 Arrival Information Details Patient Name: Date of Service: MA [name] NDA E. 12/01/2020 8:00 A M Medical Record Number: [id number] Patient Account Number: [id number] Date of Birth/Sex: Treating RN: Jun 01, 1971 (50 y.o. [name] Primary Care Estrellita Lasky: [name] Other Clinician: Referring Aundray Cartlidge: Treating Kyston Gonce/Extender: [name] in Treatment: 1 Visit Information History Since Last Visit Added or deleted any medications: No Patient Arrived: Ambulatory Any new allergies or adverse reactions: No Arrival Time: 08:02 Had a fall or experienced change in No Accompanied By: self activities of daily living that may affect Transfer Assistance: None risk of falls: Patient Identification Verified: Yes Signs or symptoms of abuse/neglect since last visito No Secondary Verification Process Completed: Yes Hospitalized since last visit: No Patient Requires Transmission-Based Precautions: No Implantable device outside of the clinic excluding No Patient Has Alerts: No cellular tissue based products placed in the center since last visit: Has Dressing in Place as Prescribed: Yes Pain Present Now: No Electronic Signature(s) Signed: 12/01/2020 2:09:33 PM By: Sandre Kitty Entered By: Sandre Kitty on 12/01/2020 08:03:02 -------------------------------------------------------------------------------- Encounter Discharge Information Details Patient Name: Date of Service: MA [name] NDA E. 12/01/2020 8:00 A M Medical Record Number: [id number] Patient Account Number: [id number] Date of Birth/Sex: Treating RN: 1971/06/09 (50 y.o. [name] Primary Care [name]: [name] Other Clinician: Referring Sonita Michiels: Treating Tamina Cyphers/Extender: [name] in Treatment: 1 Encounter Discharge Information Items Post Procedure Vitals Discharge  Condition: Stable Temperature (F): 98 Ambulatory Status: Ambulatory Pulse (bpm): 94 Discharge Destination: Home Respiratory Rate (breaths/min): 18 Transportation: Private Auto Blood Pressure (mmHg): 175/80 Accompanied By: self Schedule Follow-up Appointment: Yes Clinical Summary of Care: Patient Declined Electronic Signature(s) Signed: 12/01/2020 6:28:10 PM By: Baruch Gouty RN, BSN Entered By: Baruch Gouty on 12/01/2020 08:48:16 -------------------------------------------------------------------------------- Lower Extremity Assessment Details Patient Name: Date of Service: MA [name] NDA E. 12/01/2020 8:00 A M Medical Record Number: [id number] Patient Account Number: [id number] Date of Birth/Sex: Treating RN: 03-08-1971 (50 y.o. [name] Primary Care Nefertiti Mohamad: [name] Other Clinician: Referring Blong Busk: Treating Lisandra Mathisen/Extender: [name] in Treatment: 1 Edema Assessment Assessed: [name]: No] [Right: Yes] Edema: [Left: N] [Right: o] Calf Left: Right: Point of Measurement: From Medial Instep 35 cm Ankle Left: Right: Point of Measurement: From Medial Instep 23.5 cm Vascular Assessment Pulses: Dorsalis Pedis Palpable: [Right:Yes] Electronic Signature(s) Signed: 12/01/2020 5:25:58 PM By: Lorrin Jackson Entered By: Lorrin Jackson on 12/01/2020 08:11:05 -------------------------------------------------------------------------------- Multi Wound Chart Details Patient Name: Date of Service: MA [name] NDA E. 12/01/2020 8:00 A M Medical Record Number: [id number] Patient Account Number: [id number] Date of Birth/Sex: Treating RN: 1970-09-22 (50 y.o. [name] Primary Care [name]: [name] Other Clinician: Referring [name]: Treating Cerenity Goshorn/Extender: [name] in Treatment: 1 Vital Signs Height(in): 55 Pulse(bpm): 23 Weight(lbs): 250 Blood  Pressure(mmHg): 175/80 Body Mass Index(BMI): 37 Temperature(F): 98.0 Respiratory Rate(breaths/min): 16 Photos: [1:No Photos Right T Great oe] [N/A:N/A N/A] Wound Location: [1:Puncture] [N/A:N/A] Wounding Event: [1:Diabetic Wound/Ulcer of the Lower] [N/A:N/A] Primary Etiology: [1:Extremity Anemia, Asthma, Sleep Apnea,] [N/A:N/A] Comorbid History: [1:Coronary Artery Disease, Hypertension, Type II Diabetes, Osteoarthritis, Neuropathy, Confinement Anxiety 07/09/2020] [N/A:N/A] Date Acquired: [1:1] [N/A:N/A] Weeks of Treatment: [1:Open] [N/A:N/A] Wound Status: [1:0.3x0.4x0.4] [N/A:N/A] Measurements L x W x D (cm) [1:0.094] [N/A:N/A] A (cm) : rea [1:0.038] [N/A:N/A] Volume (cm) : [1:25.40%] [N/A:N/A] % Reduction in A rea: [1:56.80%] [N/A:N/A] % Reduction in Volume: [1:Grade 1] [N/A:N/A] Classification: [1:Small] [N/A:N/A] Exudate A  mount: [1:Serosanguineous] [N/A:N/A] Exudate Type: [1:red, brown] [N/A:N/A] Exudate Color: [1:Well defined, not attached] [N/A:N/A] Wound Margin: [1:Large (67-100%)] [N/A:N/A] Granulation A mount: [1:Red, Pink] [N/A:N/A] Granulation Quality: [1:Small (1-33%)] [N/A:N/A] Necrotic A mount: [1:Fat Layer (Subcutaneous Tissue): Yes N/A] Exposed Structures: [1:Fascia: No Tendon: No Muscle: No Joint: No Bone: No Small (1-33%)] [N/A:N/A] Epithelialization: [1:Debridement - Excisional] [N/A:N/A] Debridement: Pre-procedure Verification/Time Out 08:25 [N/A:N/A] Taken: [1:Lidocaine 5% topical ointment] [N/A:N/A] Pain Control: [1:Callus, Subcutaneous, Slough] [N/A:N/A] Tissue Debrided: [1:Skin/Subcutaneous Tissue] [N/A:N/A] Level: [1:2.25] [N/A:N/A] Debridement A (sq cm): [1:rea Curette] [N/A:N/A] Instrument: [1:Minimum] [N/A:N/A] Bleeding: [1:Pressure] [N/A:N/A] Hemostasis A chieved: [1:0] [N/A:N/A] Procedural Pain: [1:0] [N/A:N/A] Post Procedural Pain: [1:Procedure was tolerated well] [N/A:N/A] Debridement Treatment Response: [1:0.5x0.7x0.4] [N/A:N/A] Post  Debridement Measurements L x W x D (cm) [1:0.11] [N/A:N/A] Post Debridement Volume: (cm) [1:Debridement] [N/A:N/A] Treatment Notes Wound #1 (Toe Great) Wound Laterality: Right Cleanser Peri-Wound Care Topical Primary Dressing KerraCel Ag Gelling Fiber Dressing, 2x2 in (silver alginate) Discharge Instruction: **lightly pack into wound bed.*** Apply silver alginate to wound bed as instructed Secondary Dressing Woven Gauze Sponges 2x2 in Discharge Instruction: Apply over primary dressing as directed. Optifoam Non-Adhesive Dressing, 4x4 in Discharge Instruction: ***Apply foam donut*** Apply over primary dressing as directed. Secured With Conforming Stretch Gauze Bandage, Sterile 2x75 (in/in) Discharge Instruction: Secure with stretch gauze as directed. Paper Tape, 2x10 (in/yd) Discharge Instruction: Secure dressing with tape as directed. Compression Wrap Compression Stockings Add-Ons Electronic Signature(s) Signed: 12/01/2020 8:59:14 AM By: Kalman Shan DO Signed: 12/01/2020 6:28:10 PM By: Baruch Gouty RN, BSN Entered By: Kalman Shan on 12/01/2020 08:52:31 -------------------------------------------------------------------------------- Multi-Disciplinary Care Plan Details Patient Name: Date of Service: MA [name] NDA E. 12/01/2020 8:00 A M Medical Record Number: [id number] Patient Account Number: [id number] Date of Birth/Sex: Treating RN: 1971/01/05 (50 y.o. [name] Primary Care Marquel Spoto: [name] Other Clinician: Referring Kirstan Fentress: Treating Erol Flanagin/Extender: [name] in Treatment: 1 Multidisciplinary Care Plan reviewed with physician Active Inactive Abuse / Safety / Falls / Self Care Management Nursing Diagnoses: History of Falls Potential for falls Goals: Patient will not experience any injury related to falls Date Initiated: 11/19/2020 Target Resolution Date: 12/17/2020 Goal Status:  Active Interventions: Assess fall risk on admission and as needed Assess impairment of mobility on admission and as needed per policy Notes: Nutrition Nursing Diagnoses: Impaired glucose control: actual or potential Potential for alteratiion in Nutrition/Potential for imbalanced nutrition Goals: Patient/caregiver will maintain therapeutic glucose control Date Initiated: 11/19/2020 Target Resolution Date: 12/17/2020 Goal Status: Active Interventions: Assess patient nutrition upon admission and as needed per policy Provide education on elevated blood sugars and impact on wound healing Provide education on nutrition Treatment Activities: Patient referred to Primary Care Physician for further nutritional evaluation : 11/19/2020 Notes: Wound/Skin Impairment Nursing Diagnoses: Impaired tissue integrity Knowledge deficit related to ulceration/compromised skin integrity Goals: Patient/caregiver will verbalize understanding of skin care regimen Date Initiated: 11/19/2020 Target Resolution Date: 12/17/2020 Goal Status: Active Ulcer/skin breakdown will have a volume reduction of 30% by week 4 Date Initiated: 11/19/2020 Target Resolution Date: 12/17/2020 Goal Status: Active Interventions: Assess patient/caregiver ability to obtain necessary supplies Assess patient/caregiver ability to perform ulcer/skin care regimen upon admission and as needed Assess ulceration(s) every visit Provide education on ulcer and skin care Treatment Activities: Skin care regimen initiated : 11/19/2020 Topical wound management initiated : 11/19/2020 Notes: Electronic Signature(s) Signed: 12/01/2020 6:28:10 PM By: Baruch Gouty RN, BSN Entered By: Baruch Gouty on 12/01/2020 08:10:59 -------------------------------------------------------------------------------- Pain Assessment Details Patient Name: Date of Service: MA [name], [name]  NDA E. 12/01/2020 8:00 A M Medical Record Number: [id number] Patient Account  Number: [id number] Date of Birth/Sex: Treating RN: 08-25-1970 (50 y.o. [name] Primary Care Burnette Sautter: [name] Other Clinician: Referring Norrine Ballester: Treating Royalti Schauf/Extender: [name] in Treatment: 1 Active Problems Location of Pain Severity and Description of Pain Patient Has Paino No Site Locations Pain Management and Medication Current Pain Management: Electronic Signature(s) Signed: 12/01/2020 2:09:33 PM By: Sandre Kitty Signed: 12/01/2020 6:28:10 PM By: Baruch Gouty RN, BSN Entered By: Sandre Kitty on 12/01/2020 08:03:36 -------------------------------------------------------------------------------- Patient/Caregiver Education Details Patient Name: Date of Service: MA [name] NDA E. 4/26/2022andnbsp8:00 A M Medical Record Number: [id number] Patient Account Number: [id number] Date of Birth/Gender: Treating RN: 1971-07-13 (50 y.o. [name] Primary Care Physician: [name] Other Clinician: Referring Physician: Treating Physician/Extender: [name] in Treatment: 1 Education Assessment Education Provided To: Patient Education Topics Provided Offloading: Methods: Explain/Verbal Responses: Reinforcements needed, State content correctly Wound/Skin Impairment: Methods: Explain/Verbal Responses: Reinforcements needed, State content correctly Electronic Signature(s) Signed: 12/01/2020 6:28:10 PM By: Baruch Gouty RN, BSN Entered By: Baruch Gouty on 12/01/2020 08:11:50 -------------------------------------------------------------------------------- Wound Assessment Details Patient Name: Date of Service: MA [name] NDA E. 12/01/2020 8:00 A M Medical Record Number: [id number] Patient Account Number: [id number] Date of Birth/Sex: Treating RN: 1971-01-23 (50 y.o. [name] Primary Care Chaniah Cisse: [name] Other Clinician: Referring  Alexei Ey: Treating Trell Secrist/Extender: [name] in Treatment: 1 Wound Status Wound Number: 1 Primary Diabetic Wound/Ulcer of the Lower Extremity Etiology: Wound Location: Right T Great oe Wound Open Wounding Event: Puncture Status: Date Acquired: 07/09/2020 Comorbid Anemia, Asthma, Sleep Apnea, Coronary Artery Disease, Weeks Of Treatment: 1 History: Hypertension, Type II Diabetes, Osteoarthritis, Neuropathy, Clustered Wound: No Confinement Anxiety Photos Wound Measurements Length: (cm) 0.3 Width: (cm) 0.4 Depth: (cm) 0.4 Area: (cm) 0.094 Volume: (cm) 0.038 % Reduction in Area: 25.4% % Reduction in Volume: 56.8% Epithelialization: Small (1-33%) Tunneling: No Undermining: No Wound Description Classification: Grade 1 Wound Margin: Well defined, not attached Exudate Amount: Small Exudate Type: Serosanguineous Exudate Color: red, brown Foul Odor After Cleansing: No Slough/Fibrino Yes Wound Bed Granulation Amount: Large (67-100%) Exposed Structure Granulation Quality: Red, Pink Fascia Exposed: No Necrotic Amount: Small (1-33%) Fat Layer (Subcutaneous Tissue) Exposed: Yes Necrotic Quality: Adherent Slough Tendon Exposed: No Muscle Exposed: No Joint Exposed: No Bone Exposed: No Treatment Notes Wound #1 (Toe Great) Wound Laterality: Right Cleanser Peri-Wound Care Topical Primary Dressing KerraCel Ag Gelling Fiber Dressing, 2x2 in (silver alginate) Discharge Instruction: **lightly pack into wound bed.*** Apply silver alginate to wound bed as instructed Secondary Dressing Woven Gauze Sponges 2x2 in Discharge Instruction: Apply over primary dressing as directed. Optifoam Non-Adhesive Dressing, 4x4 in Discharge Instruction: ***Apply foam donut*** Apply over primary dressing as directed. Secured With Conforming Stretch Gauze Bandage, Sterile 2x75 (in/in) Discharge Instruction: Secure with stretch gauze as directed. Paper Tape, 2x10  (in/yd) Discharge Instruction: Secure dressing with tape as directed. Compression Wrap Compression Stockings Add-Ons Electronic Signature(s) Signed: 12/01/2020 5:14:41 PM By: Sandre Kitty Signed: 12/01/2020 6:28:10 PM By: Baruch Gouty RN, BSN Entered By: Sandre Kitty on 12/01/2020 16:58:12 -------------------------------------------------------------------------------- Vitals Details Patient Name: Date of Service: MA [name] NDA E. 12/01/2020 8:00 A M Medical Record Number: [id number] Patient Account Number: [id number] Date of Birth/Sex: Treating RN: 05-16-1971 (50 y.o. [name] Primary Care Lennox Dolberry: [name] Other Clinician: Referring Neeka Urista: Treating Rishard Delange/Extender: [name] in Treatment: 1 Vital Signs Time Taken: 08:03 Temperature (F): 98.0  Height (in): 69 Pulse (bpm): 94 Weight (lbs): 250 Respiratory Rate (breaths/min): 16 Body Mass Index (BMI): 36.9 Blood Pressure (mmHg): 175/80 Reference Range: 80 - 120 mg / dl Electronic Signature(s) Signed: 12/01/2020 2:09:33 PM By: Sandre Kitty Entered By: Sandre Kitty on 12/01/2020 08:03:20

## 2020-12-01 NOTE — Progress Notes (Signed)
[name] ([id number]) Visit Report for 12/01/2020 Chief Complaint Document Details Patient Name: Date of Service: MA [name] NDA E. 12/01/2020 8:00 A M Medical Record Number: [id number] Patient Account Number: [id number] Date of Birth/Sex: Treating RN: [birth year] (50 y.o. [name] Primary Care Provider: Benito [name] Other Clinician: Referring Provider: Treating Provider/Extender: [name] in Treatment: 1 Information Obtained from: Patient Chief Complaint Right foot wound Electronic Signature(s) Signed: 12/01/2020 8:59:14 AM By: Teresa Shan DO Entered By: [name] on 12/01/2020 08:52:40 -------------------------------------------------------------------------------- Debridement Details Patient Name: Date of Service: MA [name] NDA E. 12/01/2020 8:00 A M Medical Record Number: [id number] Patient Account Number: [id number] Date of Birth/Sex: Treating RN: [birth year] (50 y.o. [name] Primary Care Provider: Benito [name] Other Clinician: Referring Provider: Treating Provider/Extender: [name] in Treatment: 1 Debridement Performed for Assessment: Wound #1 Right T Great oe Performed By: Physician Teresa Shan, DO Debridement Type: Debridement Severity of Tissue Pre Debridement: Fat layer exposed Level of Consciousness (Pre-procedure): Awake and Alert Pre-procedure Verification/Time Out Yes - 08:25 Taken: Start Time: 08:27 Pain Control: Lidocaine 5% topical ointment T Area Debrided (L x W): otal 1.5 (cm) x 1.5 (cm) = 2.25 (cm) Tissue and other material debrided: Viable, Non-Viable, Callus, Slough, Subcutaneous, Skin: Epidermis, Slough Level: Skin/Subcutaneous Tissue Debridement Description: Excisional Instrument: Curette Bleeding: Minimum Hemostasis Achieved: Pressure End Time: 08:34 Procedural Pain: 0 Post Procedural Pain: 0 Response to Treatment:  Procedure was tolerated well Level of Consciousness (Post- Awake and Alert procedure): Post Debridement Measurements of Total Wound Length: (cm) 0.5 Width: (cm) 0.7 Depth: (cm) 0.4 Volume: (cm) 0.11 Character of Wound/Ulcer Post Debridement: Improved Severity of Tissue Post Debridement: Fat layer exposed Post Procedure Diagnosis Same as Pre-procedure Electronic Signature(s) Signed: 12/01/2020 8:59:14 AM By: Teresa Shan DO Signed: 12/01/2020 6:28:10 PM By: Teresa Gouty RN, BSN Entered By: [name] on 12/01/2020 08:35:50 -------------------------------------------------------------------------------- HPI Details Patient Name: Date of Service: MA [name] NDA E. 12/01/2020 8:00 A M Medical Record Number: [id number] Patient Account Number: [id number] Date of Birth/Sex: Treating RN: 07-29-1971 (50 y.o. [name] Primary Care Provider: Benito [name] Other Clinician: Referring Provider: Treating Provider/Extender: [name] in Treatment: 1 History of Present Illness HPI Description: [name] is a 50 year old female with past medical history of insulin-dependent type 2 diabetes, previous left pontine CVA, essential hypertension that presents to the clinic today for evaluation of her right foot wound. She states that in December 2021 she had cut open her right great toe on a door step . It became infected and she was treated with antibiotics. She started following with podiatry in February and has been using Betadine daily on the wound. She is also using a surgical shoe to help with offloading. She has diabetic neuropathy and does not report pain to the wound. She denies any purulent drainage, fever/chills or increased warmth or erythema to the foot. 4/21; patient has been using silver alginate every other day with dressing changes. She denies any purulent drainage, fever/chills or increased warmth or erythema to the foot.  She does however notice an odor. 4/26; patient presents for 1 week follow-up. She continues to use silver alginate every other day without any issues. She denies purulent drainage, fever/chills or increased warmth or erythema to the foot. She does not notice an odor anymore. She has no complaints today. Electronic Signature(s) Signed: 12/01/2020 8:59:14 AM By: Teresa Shan DO Entered By: [name] on 12/01/2020 08:53:23 --------------------------------------------------------------------------------  Physical Exam Details Patient Name: Date of Service: MA [name] NDA E. 12/01/2020 8:00 A M Medical Record Number: [id number] Patient Account Number: [id number] Date of Birth/Sex: Treating RN: 1971-06-04 (50 y.o. [name] Primary Care Provider: Benito [name] Other Clinician: Referring Provider: Treating Provider/Extender: [name] in Treatment: 1 Constitutional respirations regular, non-labored and within target range for patient.. Cardiovascular 2+ dorsalis pedis/posterior tibialis pulses. Psychiatric pleasant and cooperative. Notes Right hallux ulcer, plantar: Severe callus to the distal portion of the toe that is difficult to debride. There is callus surrounding the small wound opening that is debrided every visit. Minimal undermining to the wound bed. No signs of infection. Electronic Signature(s) Signed: 12/01/2020 8:59:14 AM By: Teresa Shan DO Entered By: [name] on 12/01/2020 08:54:52 -------------------------------------------------------------------------------- Physician Orders Details Patient Name: Date of Service: MA [name] NDA E. 12/01/2020 8:00 A M Medical Record Number: [id number] Patient Account Number: [id number] Date of Birth/Sex: Treating RN: 01/09/1971 (50 y.o. [name] Primary Care Provider: Benito [name] Other Clinician: Referring Provider: Treating Provider/Extender: [name] in Treatment: 1 Verbal / Phone Orders: No Diagnosis Coding ICD-10 Coding Code Description E11.621 Type 2 diabetes mellitus with foot ulcer I63.9 Cerebral infarction, unspecified I10 Essential (primary) hypertension Follow-up Appointments Return Appointment in 1 week. Bathing/ Shower/ Hygiene May shower and wash wound with soap and water. - on days when dressing is changed. Off-Loading Open toe surgical shoe to: - right foot, felt donut to shoe to offload. Continue to wear surgical shoe while walking and standing. ensure no pressure to right great toe. Wound Treatment Wound #1 - T Great oe Wound Laterality: Right Prim Dressing: KerraCel Ag Gelling Fiber Dressing, 2x2 in (silver alginate) (Generic) Every Other Day/15 Days ary Discharge Instructions: **lightly pack into wound bed.*** Apply silver alginate to wound bed as instructed Secondary Dressing: Woven Gauze Sponges 2x2 in (Generic) Every Other Day/15 Days Discharge Instructions: Apply over primary dressing as directed. Secondary Dressing: Optifoam Non-Adhesive Dressing, 4x4 in Every Other Day/15 Days Discharge Instructions: ***Apply foam donut*** Apply over primary dressing as directed. Secured With: Child psychotherapist, Sterile 2x75 (in/in) (Generic) Every Other Day/15 Days Discharge Instructions: Secure with stretch gauze as directed. Secured With: Paper Tape, 2x10 (in/yd) (Generic) Every Other Day/15 Days Discharge Instructions: Secure dressing with tape as directed. Patient Medications llergies: No Known Allergies A Notifications Medication Indication Start End prior to debridement 12/01/2020 lidocaine DOSE topical 5 % cream - cream topical Electronic Signature(s) Signed: 12/01/2020 8:59:14 AM By: Teresa Shan DO Entered By: [name] on 12/01/2020 08:55:05 -------------------------------------------------------------------------------- Problem List  Details Patient Name: Date of Service: MA [name] NDA E. 12/01/2020 8:00 A M Medical Record Number: [id number] Patient Account Number: [id number] Date of Birth/Sex: Treating RN: Sep 12, 1970 (50 y.o. [name] Primary Care Provider: Benito [name] Other Clinician: Referring Provider: Treating Provider/Extender: [name] in Treatment: 1 Active Problems ICD-10 Encounter Code Description Active Date MDM Diagnosis E11.621 Type 2 diabetes mellitus with foot ulcer 11/19/2020 No Yes I63.9 Cerebral infarction, unspecified 11/19/2020 No Yes I10 Essential (primary) hypertension 11/19/2020 No Yes Inactive Problems Resolved Problems Electronic Signature(s) Signed: 12/01/2020 8:59:14 AM By: Teresa Shan DO Entered By: [name] on 12/01/2020 08:52:09 -------------------------------------------------------------------------------- Progress Note Details Patient Name: Date of Service: MA [name] NDA E. 12/01/2020 8:00 A M Medical Record Number: [id number] Patient Account Number: [id number] Date of Birth/Sex: Treating RN: 04-27-1971 (50 y.o. [name] Primary Care Provider: Benito [name] Other Clinician:  Referring Provider: Treating Provider/Extender: [name] in Treatment: 1 Subjective Chief Complaint Information obtained from Patient Right foot wound History of Present Illness (HPI) [name] is a 50 year old female with past medical history of insulin-dependent type 2 diabetes, previous left pontine CVA, essential hypertension that presents to the clinic today for evaluation of her right foot wound. She states that in December 2021 she had cut open her right great toe on a door step . It became infected and she was treated with antibiotics. She started following with podiatry in February and has been using Betadine daily on the wound. She is also using a surgical shoe to help  with offloading. She has diabetic neuropathy and does not report pain to the wound. She denies any purulent drainage, fever/chills or increased warmth or erythema to the foot. 4/21; patient has been using silver alginate every other day with dressing changes. She denies any purulent drainage, fever/chills or increased warmth or erythema to the foot. She does however notice an odor. 4/26; patient presents for 1 week follow-up. She continues to use silver alginate every other day without any issues. She denies purulent drainage, fever/chills or increased warmth or erythema to the foot. She does not notice an odor anymore. She has no complaints today. Patient History Information obtained from Patient. Family History Cancer - Maternal Grandparents, Diabetes - Mother,Father,Maternal Grandparents,Paternal Grandparents, Heart Disease - Mother,Father,Maternal Grandparents,Paternal Grandparents,Siblings, Hypertension - Mother,Father, Kidney Disease - Maternal Grandparents, Stroke - Maternal Grandparents,Mother, No family history of Hereditary Spherocytosis, Lung Disease, Seizures, Thyroid Problems, Tuberculosis. Social History Former smoker - quit 3-4 yrs ago, Marital Status - Single, Alcohol Use - Rarely, Drug Use - Current History - marijuana, Caffeine Use - Rarely. Medical History Eyes Denies history of Cataracts, Glaucoma, Optic Neuritis Hematologic/Lymphatic Patient has history of Anemia Respiratory Patient has history of Asthma, Sleep Apnea - CPAP Cardiovascular Patient has history of Coronary Artery Disease, Hypertension Endocrine Patient has history of Type II Diabetes Denies history of Type I Diabetes Genitourinary Denies history of End Stage Renal Disease Integumentary (Skin) Denies history of History of Burn Musculoskeletal Patient has history of Osteoarthritis Neurologic Patient has history of Neuropathy Oncologic Denies history of Received Chemotherapy, Received  Radiation Psychiatric Patient has history of Confinement Anxiety Denies history of Anorexia/bulimia Hospitalization/Surgery History - achilles tendon rupture right. Medical A Surgical History Notes nd Constitutional Symptoms (General Health) vitamin D deficiency Eyes hypertensive retinopathy Cardiovascular hyperlipidemia Endocrine left thyroid nodule Genitourinary CKD Neurologic stroke Objective Constitutional respirations regular, non-labored and within target range for patient.. Vitals Time Taken: 8:03 AM, Height: 69 in, Weight: 250 lbs, BMI: 36.9, Temperature: 98.0 F, Pulse: 94 bpm, Respiratory Rate: 16 breaths/min, Blood Pressure: 175/80 mmHg. Cardiovascular 2+ dorsalis pedis/posterior tibialis pulses. Psychiatric pleasant and cooperative. General Notes: Right hallux ulcer, plantar: Severe callus to the distal portion of the toe that is difficult to debride. There is callus surrounding the small wound opening that is debrided every visit. Minimal undermining to the wound bed. No signs of infection. Integumentary (Hair, Skin) Wound #1 status is Open. Original cause of wound was Puncture. The date acquired was: 07/09/2020. The wound has been in treatment 1 weeks. The wound is located on the Right T Great. The wound measures 0.3cm length x 0.4cm width x 0.4cm depth; 0.094cm^2 area and 0.038cm^3 volume. There is Fat Layer oe (Subcutaneous Tissue) exposed. There is no tunneling or undermining noted. There is a small amount of serosanguineous drainage noted. The wound margin is well  defined and not attached to the wound base. There is large (67-100%) red, pink granulation within the wound bed. There is a small (1-33%) amount of necrotic tissue within the wound bed including Adherent Slough. Assessment Active Problems ICD-10 Type 2 diabetes mellitus with foot ulcer Cerebral infarction, unspecified Essential (primary) hypertension The overall size of the wound is stable  however I do think that the wound bed has healthier granulation tissue than before. There is no undermining to the opening now. There is still significant callus surrounding this wound that is very difficult to debride. At this time we will continue with silver alginate and aggressive offloading. This wound has been going on for the past 5 months and I am concerned for barriers such as osteomyelitis impeding the healing. I would like to obtain an MRI and this was ordered at last clinic visit however it has not been scheduled. We will follow this up. If MRI is negative for osteomyelitis then we will go ahead with contact cast Procedures Wound #1 Pre-procedure diagnosis of Wound #1 is a Diabetic Wound/Ulcer of the Lower Extremity located on the Right T Great .Severity of Tissue Pre Debridement is: oe Fat layer exposed. There was a Excisional Skin/Subcutaneous Tissue Debridement with a total area of 2.25 sq cm performed by Teresa Shan, DO. With the following instrument(s): Curette to remove Viable and Non-Viable tissue/material. Material removed includes Callus, Subcutaneous Tissue, Slough, and Skin: Epidermis after achieving pain control using Lidocaine 5% topical ointment. No specimens were taken. A time out was conducted at 08:25, prior to the start of the procedure. A Minimum amount of bleeding was controlled with Pressure. The procedure was tolerated well with a pain level of 0 throughout and a pain level of 0 following the procedure. Post Debridement Measurements: 0.5cm length x 0.7cm width x 0.4cm depth; 0.11cm^3 volume. Character of Wound/Ulcer Post Debridement is improved. Severity of Tissue Post Debridement is: Fat layer exposed. Post procedure Diagnosis Wound #1: Same as Pre-Procedure Plan Follow-up Appointments: Return Appointment in 1 week. Bathing/ Shower/ Hygiene: May shower and wash wound with soap and water. - on days when dressing is changed. Off-Loading: Open toe surgical  shoe to: - right foot, felt donut to shoe to offload. Continue to wear surgical shoe while walking and standing. ensure no pressure to right great toe. The following medication(s) was prescribed: lidocaine topical 5 % cream cream topical for prior to debridement was prescribed at facility WOUND #1: - T Great Wound Laterality: Right oe Prim Dressing: KerraCel Ag Gelling Fiber Dressing, 2x2 in (silver alginate) (Generic) Every Other Day/15 Days ary Discharge Instructions: **lightly pack into wound bed.*** Apply silver alginate to wound bed as instructed Secondary Dressing: Woven Gauze Sponges 2x2 in (Generic) Every Other Day/15 Days Discharge Instructions: Apply over primary dressing as directed. Secondary Dressing: Optifoam Non-Adhesive Dressing, 4x4 in Every Other Day/15 Days Discharge Instructions: ***Apply foam donut*** Apply over primary dressing as directed. Secured With: Child psychotherapist, Sterile 2x75 (in/in) (Generic) Every Other Day/15 Days Discharge Instructions: Secure with stretch gauze as directed. Secured With: Paper T ape, 2x10 (in/yd) (Generic) Every Other Day/15 Days Discharge Instructions: Secure dressing with tape as directed. 1. Silver alginate every other day 2. MRI of the right foot 3. Aggressive offloading 4. In office sharp debridement 5. Follow-up in 1 week Electronic Signature(s) Signed: 12/01/2020 8:59:14 AM By: Teresa Shan DO Entered By: [name] on 12/01/2020 08:57:53 -------------------------------------------------------------------------------- HxROS Details Patient Name: Date of Service: MA [name], [name] NDA E. 12/01/2020 8:00  A M Medical Record Number: [id number] Patient Account Number: [id number] Date of Birth/Sex: Treating RN: 1971/07/05 (50 y.o. [name] Primary Care Provider: Benito [name] Other Clinician: Referring Provider: Treating Provider/Extender: [name] in  Treatment: 1 Information Obtained From Patient Constitutional Symptoms (General Health) Medical History: Past Medical History Notes: vitamin D deficiency Eyes Medical History: Negative for: Cataracts; Glaucoma; Optic Neuritis Past Medical History Notes: hypertensive retinopathy Hematologic/Lymphatic Medical History: Positive for: Anemia Respiratory Medical History: Positive for: Asthma; Sleep Apnea - CPAP Cardiovascular Medical History: Positive for: Coronary Artery Disease; Hypertension Past Medical History Notes: hyperlipidemia Endocrine Medical History: Positive for: Type II Diabetes Negative for: Type I Diabetes Past Medical History Notes: left thyroid nodule Time with diabetes: since age 4 Blood sugar tested every day: Yes Tested : 2-3 times per day Genitourinary Medical History: Negative for: End Stage Renal Disease Past Medical History Notes: CKD Integumentary (Skin) Medical History: Negative for: History of Burn Musculoskeletal Medical History: Positive for: Osteoarthritis Neurologic Medical History: Positive for: Neuropathy Past Medical History Notes: stroke Oncologic Medical History: Negative for: Received Chemotherapy; Received Radiation Psychiatric Medical History: Positive for: Confinement Anxiety Negative for: Anorexia/bulimia Immunizations Pneumococcal Vaccine: Received Pneumococcal Vaccination: No Implantable Devices No devices added Hospitalization / Surgery History Type of Hospitalization/Surgery achilles tendon rupture right Family and Social History Cancer: Yes - Maternal Grandparents; Diabetes: Yes - Mother,Father,Maternal Grandparents,Paternal Grandparents; Heart Disease: Yes - Mother,Father,Maternal Grandparents,Paternal Grandparents,Siblings; Hereditary Spherocytosis: No; Hypertension: Yes - Mother,Father; Kidney Disease: Yes - Maternal Grandparents; Lung Disease: No; Seizures: No; Stroke: Yes - Maternal Grandparents,Mother;  Thyroid Problems: No; Tuberculosis: No; Former smoker - quit 3-4 yrs ago; Marital Status - Single; Alcohol Use: Rarely; Drug Use: Current History - marijuana; Caffeine Use: Rarely; Financial Concerns: No; Food, Clothing or Shelter Needs: No; Support System Lacking: No; Transportation Concerns: No Electronic Signature(s) Signed: 12/01/2020 8:59:14 AM By: Teresa Shan DO Signed: 12/01/2020 6:28:10 PM By: Teresa Gouty RN, BSN Entered By: [name] on 12/01/2020 08:53:31 -------------------------------------------------------------------------------- SuperBill Details Patient Name: Date of Service: MA [name] NDA E. 12/01/2020 Medical Record Number: [id number] Patient Account Number: [id number] Date of Birth/Sex: Treating RN: 28-Feb-1971 (50 y.o. [name] Primary Care Provider: Benito [name] Other Clinician: Referring Provider: Treating Provider/Extender: [name] in Treatment: 1 Diagnosis Coding ICD-10 Codes Code Description [id number] Type 2 diabetes mellitus with foot ulcer I63.9 Cerebral infarction, unspecified I10 Essential (primary) hypertension Facility Procedures Electronic Signature(s) Signed: 12/01/2020 8:59:14 AM By: Teresa Shan DO Entered By: [name] on 12/01/2020 08:58:42

## 2020-12-07 ENCOUNTER — Ambulatory Visit (INDEPENDENT_AMBULATORY_CARE_PROVIDER_SITE_OTHER): Payer: Medicaid Other | Admitting: Obstetrics

## 2020-12-07 ENCOUNTER — Other Ambulatory Visit: Payer: Self-pay

## 2020-12-07 ENCOUNTER — Encounter: Payer: Self-pay | Admitting: Obstetrics

## 2020-12-07 VITALS — BP 171/81 | HR 90 | Ht 69.0 in | Wt 241.0 lb

## 2020-12-07 DIAGNOSIS — N939 Abnormal uterine and vaginal bleeding, unspecified: Secondary | ICD-10-CM | POA: Diagnosis not present

## 2020-12-07 DIAGNOSIS — Z8673 Personal history of transient ischemic attack (TIA), and cerebral infarction without residual deficits: Secondary | ICD-10-CM

## 2020-12-07 DIAGNOSIS — E669 Obesity, unspecified: Secondary | ICD-10-CM | POA: Diagnosis not present

## 2020-12-07 DIAGNOSIS — I10 Essential (primary) hypertension: Secondary | ICD-10-CM | POA: Diagnosis not present

## 2020-12-07 DIAGNOSIS — E139 Other specified diabetes mellitus without complications: Secondary | ICD-10-CM

## 2020-12-07 NOTE — Progress Notes (Addendum)
GYN presents for Irregular Periods,  C/o not having a periods for more than 3+ months and it started last Thursday, it is heavy, pain 8-9/10.  Last PAP 12/11/2020

## 2020-12-07 NOTE — Progress Notes (Signed)
Patient ID: [name], female   DOB: [birth year], 50 y.o.   MRN: [id number]  Chief Complaint  Patient presents with  . Vaginal Bleeding    HPI [name] is a 50 y.o. female.  No period for 3 months, then woke up to heavy bleeding last Thursday, but has decreased like it is going to stop over the past 24 hours to just scant vaginal bleeding now.  Reported having some cramping last week but none now. HPI  Past Medical History:  Diagnosis Date  . Arthritis   . Asthma   . Cataract    Mixed form OU  . CKD (chronic kidney disease)   . Coronary artery disease   . Diabetes mellitus   . Diabetic retinopathy ([place])    NPDR OU  . Hyperlipidemia   . Hypertension   . Hypertensive retinopathy    OU  . Left thyroid nodule    diagnosed 07/2018  . Pseudotumor cerebri   . Stroke ([name])   . Vitamin D deficiency     Past Surgical History:  Procedure Laterality Date  . ACHILLES TENDON REPAIR    . TUBAL LIGATION      Family History  Problem Relation Age of Onset  . Asthma Mother   . Hypertension Father   . Kidney disease Father   . Asthma Other   . Hyperlipidemia Other   . Hypertension Other   . Cancer Other   . Diabetes Maternal Grandmother   . Congestive Heart Failure Maternal Grandmother   . Hypertension Sister   . Asthma Sister   . Congestive Heart Failure Maternal Aunt     Social History Social History   Tobacco Use  . Smoking status: Former Smoker    Packs/day: 0.50    Years: 30.00    Pack years: 15.00    Types: Cigarettes    Quit date: 2017    Years since quitting: 5.3  . Smokeless tobacco: Never Used  Vaping Use  . Vaping Use: Never used  Substance Use Topics  . Alcohol use: Not Currently    Alcohol/week: 0.0 standard drinks    Comment: rare  . Drug use: Yes    Types: Marijuana    Comment: Daily    No Known Allergies  Current Outpatient Medications  Medication Sig Dispense Refill  . Accu-Chek FastClix Lancets MISC 4 (four) times daily.    Marland Kitchen  ACCU-CHEK GUIDE test strip 1 each by Other route See admin instructions. for testing 100 each 2  . albuterol (VENTOLIN HFA) 108 (90 Base) MCG/ACT inhaler Inhale into the lungs every 6 (six) hours as needed for wheezing or shortness of breath.    Marland Kitchen amLODipine-benazepril (LOTREL) 10-40 MG capsule Take 1 capsule by mouth daily. 90 capsule 2  . aspirin 81 MG EC tablet Take 1 tablet (81 mg total) by mouth daily. 90 tablet 2  . blood glucose meter kit and supplies KIT Dispense based on patient and insurance preference. Use up to four times daily as directed. (FOR ICD-9 250.00, 250.01). 1 each 0  . carvedilol (COREG) 25 MG tablet Take 1 tablet (25 mg total) by mouth 2 (two) times daily with a meal. (Patient not taking: Reported on 12/07/2020) 180 tablet 2  . Cholecalciferol (VITAMIN D3) 25 MCG (1000 UT) CAPS Take 1 capsule by mouth daily.    . folic acid (FOLVITE) 1 MG tablet Take 1 mg by mouth daily.    . furosemide (LASIX) 20 MG tablet Take 2 tablets (40  mg total) by mouth 2 (two) times daily. Take in the morning and afternoon 90 tablet 3  . glipiZIDE (GLUCOTROL) 10 MG tablet Take 10 mg by mouth daily.    . insulin aspart protamine- aspart (NOVOLOG MIX 70/30) (70-30) 100 UNIT/ML injection Inject 0.15 mLs (15 Units total) into the skin 2 (two) times daily with a meal. (Patient taking differently: Inject 15-17 Units into the skin See admin instructions. Inject 15 units subcutaneous in the morning, then inject 17 units subcutaneous in the evening) 10 mL 11  . Insulin Syringes, Disposable, J-[id number] 1 ML MISC 1 application by Does not apply route 2 (two) times a day. 100 each 0  . ipratropium-albuterol (DUONEB) 0.5-2.5 (3) MG/3ML SOLN Take 3 mLs by nebulization every 4 (four) hours as needed. 360 mL 0  . isosorbide-hydrALAZINE (BIDIL) 20-37.5 MG tablet Take 2 tablets by mouth 3 (three) times daily. 540 tablet 3  . metFORMIN (GLUCOPHAGE) 1000 MG tablet Take 1,000 mg by mouth 2 (two) times daily.    . rosuvastatin  (CRESTOR) 10 MG tablet Take 1 tablet (10 mg total) by mouth daily. 90 tablet 3  . vitamin B-12 (CYANOCOBALAMIN) 1000 MCG tablet Take 1,000 mcg by mouth daily.     No current facility-administered medications for this visit.    Review of Systems Review of Systems Constitutional: negative for fatigue and weight loss Respiratory: negative for cough and wheezing Cardiovascular: negative for chest pain, fatigue and palpitations Gastrointestinal: negative for abdominal pain and change in bowel habits Genitourinary: positive for AUB Integument/breast: negative for nipple discharge Musculoskeletal:negative for myalgias Neurological: negative for gait problems and tremors Behavioral/Psych: negative for abusive relationship, depression Endocrine: negative for temperature intolerance      Blood pressure (!) 171/81, pulse 90, height _0  (1.753 m), weight 241 lb (109.3 kg), last menstrual period 12/04/2020.  Physical Exam Physical Exam General:   alert and no distress  Skin:   no rash or abnormalities  Lungs:   clear to auscultation bilaterally  Heart:   regular rate and rhythm, S1, S2 normal, no murmur, click, rub or gallop  Patient declines pelvic exam due due to vaginal bleeding.  I have spent a total of 15 minutes of face-to-face time, excluding clinical staff time, reviewing notes and preparing to see patient, ordering tests and/or medications, and counseling the patient.  Data Reviewed Ultrasound US PELVIC COMPLETE WITH TRANSVAGINAL (Accession [id number]) (Order [id number]) Imaging Date: 12/23/2019 Department: [facility] Released By: Claudius Sis Authorizing: Shelly Bombard, MD    Exam Status  Status  Final [99]   PACS Intelerad Image Link  Show images for US PELVIC COMPLETE WITH TRANSVAGINAL  Study Result  Narrative & Impression  CLINICAL DATA:  Abnormal uterine bleeding.  EXAM: TRANSABDOMINAL AND TRANSVAGINAL ULTRASOUND OF  PELVIS  TECHNIQUE: Both transabdominal and transvaginal ultrasound examinations of the pelvis were performed. Transabdominal technique was performed for global imaging of the pelvis including uterus, ovaries, adnexal regions, and pelvic cul-de-sac. It was necessary to proceed with endovaginal exam following the transabdominal exam to visualize the endometrium and bilateral ovaries.  COMPARISON:  Comparison study from 07/24/2015  FINDINGS: Uterus  Measurements: 11.2 x 4.8 x 6.1 cm = volume: 169 mL. Retroverted uterus limits assessment in there is heterogeneity of the myometrium.  Endometrium  Thickness: 8.4 mm.  No focal abnormality visualized.  Right ovary  Measurements: 3.2 x 1.6 x 2.0 cm = volume: 5.2 mL. Normal appearance/no adnexal mass.  Left ovary  Measurements: 3.9 x  2.2 x 2.9 cm = volume: 12.7 mL. Normal appearance/no adnexal mass.  Other findings  No abnormal free fluid.  IMPRESSION: 1. 8 mm endometrial thickness in this patient who is reportedly premenopausal. However, given the heterogeneous appearance of the uterus and mildly globular contour adenomyosis is considered but the myometrium is not well evaluated given retroverted position of the uterus. MRI may be helpful given continued reported abnormal uterine bleeding. 2. Normal appearance of the ovaries.   Electronically Signed   By: Zetta Bills M.D.   On: 12/23/2019 10:09      Assessment     1. Abnormal uterine bleeding (AUB) - less bleeding today, and no significant cramping Rx: - US PELVIC COMPLETE WITH TRANSVAGINAL; Future  2. Obesity (BMI 30-39.9) - program of caloric reduction, exercise and behavioral modification recommended  3. Diabetes 1.5, managed as type 2 ([place]) - managed by PCP  4. Essential hypertension - managed by PCP  5. H/O: stroke - on Lovenox and Baby ASA    Plan   Follow up in 2 weeks  Orders Placed This Encounter  Procedures  . US  PELVIC COMPLETE WITH TRANSVAGINAL    Standing Status:   Future    Standing Expiration Date:   12/07/2021    Order Specific Question:   Reason for Exam (SYMPTOM  OR DIAGNOSIS REQUIRED)    Answer:   AUB    Order Specific Question:   Preferred imaging location?    Answer:   [facility]       Shelly Bombard, MD 12/07/2020 8:49 AM

## 2020-12-08 ENCOUNTER — Encounter (HOSPITAL_BASED_OUTPATIENT_CLINIC_OR_DEPARTMENT_OTHER): Payer: Medicaid Other | Attending: Internal Medicine | Admitting: Internal Medicine

## 2020-12-08 DIAGNOSIS — I10 Essential (primary) hypertension: Secondary | ICD-10-CM | POA: Insufficient documentation

## 2020-12-08 DIAGNOSIS — Z833 Family history of diabetes mellitus: Secondary | ICD-10-CM | POA: Insufficient documentation

## 2020-12-08 DIAGNOSIS — E11621 Type 2 diabetes mellitus with foot ulcer: Secondary | ICD-10-CM | POA: Diagnosis not present

## 2020-12-08 DIAGNOSIS — G4730 Sleep apnea, unspecified: Secondary | ICD-10-CM | POA: Insufficient documentation

## 2020-12-08 DIAGNOSIS — E1140 Type 2 diabetes mellitus with diabetic neuropathy, unspecified: Secondary | ICD-10-CM | POA: Insufficient documentation

## 2020-12-08 DIAGNOSIS — E785 Hyperlipidemia, unspecified: Secondary | ICD-10-CM | POA: Diagnosis not present

## 2020-12-08 DIAGNOSIS — Z794 Long term (current) use of insulin: Secondary | ICD-10-CM | POA: Diagnosis not present

## 2020-12-08 DIAGNOSIS — Z87891 Personal history of nicotine dependence: Secondary | ICD-10-CM | POA: Insufficient documentation

## 2020-12-08 DIAGNOSIS — L97519 Non-pressure chronic ulcer of other part of right foot with unspecified severity: Secondary | ICD-10-CM | POA: Diagnosis not present

## 2020-12-08 DIAGNOSIS — I639 Cerebral infarction, unspecified: Secondary | ICD-10-CM | POA: Diagnosis not present

## 2020-12-08 DIAGNOSIS — Z8249 Family history of ischemic heart disease and other diseases of the circulatory system: Secondary | ICD-10-CM | POA: Diagnosis not present

## 2020-12-08 DIAGNOSIS — I2510 Atherosclerotic heart disease of native coronary artery without angina pectoris: Secondary | ICD-10-CM | POA: Diagnosis not present

## 2020-12-08 DIAGNOSIS — Z823 Family history of stroke: Secondary | ICD-10-CM | POA: Insufficient documentation

## 2020-12-11 ENCOUNTER — Ambulatory Visit (HOSPITAL_COMMUNITY): Payer: Medicaid Other

## 2020-12-11 ENCOUNTER — Encounter (HOSPITAL_COMMUNITY): Payer: Self-pay

## 2020-12-11 NOTE — Progress Notes (Signed)
EARLINE, TANKS ([id number]) Visit Report for 12/08/2020 Chief Complaint Document Details Patient Name: Date of Service: [name] 12/08/2020 8:30 A M Medical Record Number: [id number] Patient Account Number: [id number] Date of Birth/Sex: Treating RN: March 31, 1971 (50 y.o. [name], [name] Primary Care Provider: [name] Other Clinician: Referring Provider: Treating Provider/Extender: [name] in Treatment: 2 Information Obtained from: Patient Chief Complaint Right foot wound Electronic Signature(s) Signed: 12/08/2020 9:47:32 AM By: Kalman Shan DO Entered By: Kalman Shan on 12/08/2020 09:40:21 -------------------------------------------------------------------------------- Debridement Details Patient Name: Date of Service: [name] 12/08/2020 8:30 A M Medical Record Number: [id number] Patient Account Number: [id number] Date of Birth/Sex: Treating RN: 1971-04-06 (50 y.o. [name], [name] Primary Care Provider: [name] Other Clinician: Referring Provider: Treating Provider/Extender: [name] in Treatment: 2 Debridement Performed for Assessment: Wound #1 Right T Great oe Performed By: Physician Kalman Shan, DO Debridement Type: Debridement Severity of Tissue Pre Debridement: Fat layer exposed Level of Consciousness (Pre-procedure): Awake and Alert Pre-procedure Verification/Time Out Yes - 09:22 Taken: Start Time: 09:23 Pain Control: Lidocaine 4% T opical Solution T Area Debrided (L x W): otal 0.5 (cm) x 0.5 (cm) = 0.25 (cm) Tissue and other material debrided: Viable, Non-Viable, Callus, Subcutaneous, Skin: Dermis , Skin: Epidermis, Fibrin/Exudate Level: Skin/Subcutaneous Tissue Debridement Description: Excisional Instrument: Curette Bleeding: Moderate Hemostasis Achieved: Silver Nitrate End Time: 09:26 Procedural Pain: 0 Post Procedural Pain:  0 Response to Treatment: Procedure was tolerated well Level of Consciousness (Post- Awake and Alert procedure): Post Debridement Measurements of Total Wound Length: (cm) 0.3 Width: (cm) 0.3 Depth: (cm) 0.4 Volume: (cm) 0.028 Character of Wound/Ulcer Post Debridement: Improved Severity of Tissue Post Debridement: Fat layer exposed Post Procedure Diagnosis Same as Pre-procedure Electronic Signature(s) Signed: 12/08/2020 9:47:32 AM By: Kalman Shan DO Signed: 12/09/2020 6:54:10 PM By: Deon Pilling Entered By: Deon Pilling on 12/08/2020 09:32:47 -------------------------------------------------------------------------------- HPI Details Patient Name: Date of Service: [name] 12/08/2020 8:30 A M Medical Record Number: [id number] Patient Account Number: [id number] Date of Birth/Sex: Treating RN: 03-26-1971 (50 y.o. [name], [name] Primary Care Provider: [name] Other Clinician: Referring Provider: Treating Provider/Extender: [name] in Treatment: 2 History of Present Illness HPI Description: Ms. Sheba Sefton is a 50 year old female with past medical history of insulin-dependent type 2 diabetes, previous left pontine CVA, essential hypertension that presents to the clinic today for evaluation of her right foot wound. She states that in December 2021 she had cut open her right great toe on a door step . It became infected and she was treated with antibiotics. She started following with podiatry in February and has been using Betadine daily on the wound. She is also using a surgical shoe to help with offloading. She has diabetic neuropathy and does not report pain to the wound. She denies any purulent drainage, fever/chills or increased warmth or erythema to the foot. 4/21; patient has been using silver alginate every other day with dressing changes. She denies any purulent drainage, fever/chills or increased warmth or erythema  to the foot. She does however notice an odor. 4/26; patient presents for 1 week follow-up. She continues to use silver alginate every other day without any issues. She denies purulent drainage, fever/chills or increased warmth or erythema to the foot. She does not notice an odor anymore. She has no complaints today. 5/3; patient presents for 1 week follow-up. She states that she has an MRI scheduled  for Friday. She has been using silver alginate every other day to the wound. She has noted more maceration. She denies any pain or acute signs of infection. Electronic Signature(s) Signed: 12/08/2020 9:47:32 AM By: Kalman Shan DO Entered By: Kalman Shan on 12/08/2020 09:41:26 -------------------------------------------------------------------------------- Physical Exam Details Patient Name: Date of Service: [name] 12/08/2020 8:30 A M Medical Record Number: [id number] Patient Account Number: [id number] Date of Birth/Sex: Treating RN: [birth year] (50 y.o. [name], [name] Primary Care Provider: [name] Other Clinician: Referring Provider: Treating Provider/Extender: [name] in Treatment: 2 Constitutional respirations regular, non-labored and within target range for patient.. Cardiovascular 2+ dorsalis pedis/posterior tibialis pulses. Psychiatric pleasant and cooperative. Notes Right great toe: Wound On the plantar aspect. The wound has surrounding callus although improved from last clinic visit. There is maceration around the wound bed. Post debridement there was granulation tissue present. Electronic Signature(s) Signed: 12/08/2020 9:47:32 AM By: Kalman Shan DO Entered By: Kalman Shan on 12/08/2020 09:41:56 -------------------------------------------------------------------------------- Physician Orders Details Patient Name: Date of Service: [name] 12/08/2020 8:30 A M Medical Record Number:  [id number] Patient Account Number: [id number] Date of Birth/Sex: Treating RN: November 18, 1970 (50 y.o. [name] Primary Care Provider: [name] Other Clinician: Referring Provider: Treating Provider/Extender: [name] in Treatment: 2 Verbal / Phone Orders: No Diagnosis Coding ICD-10 Coding Code Description E11.621 Type 2 diabetes mellitus with foot ulcer I63.9 Cerebral infarction, unspecified I10 Essential (primary) hypertension Follow-up Appointments Return Appointment in 1 week. Bathing/ Shower/ Hygiene May shower and wash wound with soap and water. - on days when dressing is changed. Off-Loading Open toe surgical shoe to: - right foot, felt donut to shoe to offload. Continue to wear surgical shoe while walking and standing. ensure no pressure to right great toe. Wound Treatment Wound #1 - T Great oe Wound Laterality: Right Prim Dressing: Hydrofera Blue Classic Foam, 2x2 in Every Other Day/15 Days ary Discharge Instructions: Moisten with saline prior to applying to wound bed Secondary Dressing: Woven Gauze Sponges 2x2 in (Generic) Every Other Day/15 Days Discharge Instructions: Apply over primary dressing as directed. Secondary Dressing: Optifoam Non-Adhesive Dressing, 4x4 in Every Other Day/15 Days Discharge Instructions: ***Apply foam donut*** Apply over primary dressing as directed. Secured With: Child psychotherapist, Sterile 2x75 (in/in) (Generic) Every Other Day/15 Days Discharge Instructions: Secure with stretch gauze as directed. Secured With: Paper Tape, 2x10 (in/yd) (Generic) Every Other Day/15 Days Discharge Instructions: Secure dressing with tape as directed. Electronic Signature(s) Signed: 12/08/2020 9:47:32 AM By: Kalman Shan DO Entered By: Kalman Shan on 12/08/2020 09:42:09 -------------------------------------------------------------------------------- Problem List Details Patient Name: Date  of Service: [name] 12/08/2020 8:30 A M Medical Record Number: [id number] Patient Account Number: [id number] Date of Birth/Sex: Treating RN: Oct 24, 1970 (50 y.o. [name], [name] Primary Care Provider: [name] Other Clinician: Referring Provider: Treating Provider/Extender: [name] in Treatment: 2 Active Problems ICD-10 Encounter Code Description Active Date MDM Diagnosis E11.621 Type 2 diabetes mellitus with foot ulcer 11/19/2020 No Yes I63.9 Cerebral infarction, unspecified 11/19/2020 No Yes I10 Essential (primary) hypertension 11/19/2020 No Yes Inactive Problems Resolved Problems Electronic Signature(s) Signed: 12/08/2020 9:47:32 AM By: Kalman Shan DO Entered By: Kalman Shan on 12/08/2020 09:40:01 -------------------------------------------------------------------------------- Progress Note Details Patient Name: Date of Service: [name] 12/08/2020 8:30 A M Medical Record Number: [id number] Patient Account Number: [id number] Date of Birth/Sex: Treating RN: 02/08/1971 (50 y.o. Benjaman Lobe Primary Care  Provider: [name] Other Clinician: Referring Provider: Treating Provider/Extender: [name] in Treatment: 2 Subjective Chief Complaint Information obtained from Patient Right foot wound History of Present Illness (HPI) Ms. Cariah Poirot is a 50 year old female with past medical history of insulin-dependent type 2 diabetes, previous left pontine CVA, essential hypertension that presents to the clinic today for evaluation of her right foot wound. She states that in December 2021 she had cut open her right great toe on a door step . It became infected and she was treated with antibiotics. She started following with podiatry in February and has been using Betadine daily on the wound. She is also using a surgical shoe to help with offloading. She has  diabetic neuropathy and does not report pain to the wound. She denies any purulent drainage, fever/chills or increased warmth or erythema to the foot. 4/21; patient has been using silver alginate every other day with dressing changes. She denies any purulent drainage, fever/chills or increased warmth or erythema to the foot. She does however notice an odor. 4/26; patient presents for 1 week follow-up. She continues to use silver alginate every other day without any issues. She denies purulent drainage, fever/chills or increased warmth or erythema to the foot. She does not notice an odor anymore. She has no complaints today. 5/3; patient presents for 1 week follow-up. She states that she has an MRI scheduled for Friday. She has been using silver alginate every other day to the wound. She has noted more maceration. She denies any pain or acute signs of infection. Patient History Information obtained from Patient. Family History Cancer - Maternal Grandparents, Diabetes - Mother,Father,Maternal Grandparents,Paternal Grandparents, Heart Disease - Mother,Father,Maternal Grandparents,Paternal Grandparents,Siblings, Hypertension - Mother,Father, Kidney Disease - Maternal Grandparents, Stroke - Maternal Grandparents,Mother, No family history of Hereditary Spherocytosis, Lung Disease, Seizures, Thyroid Problems, Tuberculosis. Social History Former smoker - quit 3-4 yrs ago, Marital Status - Single, Alcohol Use - Rarely, Drug Use - Current History - marijuana, Caffeine Use - Rarely. Medical History Eyes Denies history of Cataracts, Glaucoma, Optic Neuritis Hematologic/Lymphatic Patient has history of Anemia Respiratory Patient has history of Asthma, Sleep Apnea - CPAP Cardiovascular Patient has history of Coronary Artery Disease, Hypertension Endocrine Patient has history of Type II Diabetes Denies history of Type I Diabetes Genitourinary Denies history of End Stage Renal Disease Integumentary  (Skin) Denies history of History of Burn Musculoskeletal Patient has history of Osteoarthritis Neurologic Patient has history of Neuropathy Oncologic Denies history of Received Chemotherapy, Received Radiation Psychiatric Patient has history of Confinement Anxiety Denies history of Anorexia/bulimia Hospitalization/Surgery History - achilles tendon rupture right. Medical A Surgical History Notes nd Constitutional Symptoms (General Health) vitamin D deficiency Eyes hypertensive retinopathy Cardiovascular hyperlipidemia Endocrine left thyroid nodule Genitourinary CKD Neurologic stroke Objective Constitutional respirations regular, non-labored and within target range for patient.. Vitals Time Taken: 9:00 AM, Height: 69 in, Weight: 250 lbs, BMI: 36.9, Temperature: 98 F, Pulse: 99 bpm, Respiratory Rate: 16 breaths/min, Blood Pressure: 193/93 mmHg, Capillary Blood Glucose: 274 mg/dl. General Notes: per patient blood glucose checked last night. Cardiovascular 2+ dorsalis pedis/posterior tibialis pulses. Psychiatric pleasant and cooperative. General Notes: Right great toe: Wound On the plantar aspect. The wound has surrounding callus although improved from last clinic visit. There is maceration around the wound bed. Post debridement there was granulation tissue present. Integumentary (Hair, Skin) Wound #1 status is Open. Original cause of wound was Puncture. The date acquired was: 07/09/2020. The wound has been in treatment 2 weeks.  The wound is located on the Right T Great. The wound measures 0.3cm length x 0.3cm width x 0.5cm depth; 0.071cm^2 area and 0.035cm^3 volume. There is Fat Layer oe (Subcutaneous Tissue) exposed. There is no tunneling noted, however, there is undermining starting at 12:00 and ending at 2:00 with a maximum distance of 0.6cm. There is a small amount of serosanguineous drainage noted. The wound margin is well defined and not attached to the wound base. There  is large (67- 100%) red, pink granulation within the wound bed. There is a small (1-33%) amount of necrotic tissue within the wound bed. General Notes: callous to periwound. Assessment Active Problems ICD-10 Type 2 diabetes mellitus with foot ulcer Cerebral infarction, unspecified Essential (primary) hypertension Overall the wound is stable. It does appear more macerated and I would like to switch from silver alginate to a foam dressing. There are no acute signs of infection today. The wound was debrided and some granulation tissue present. we will see what the MRI shows. If no signs of osteo then will proceed with a total contact cast. Procedures Wound #1 Pre-procedure diagnosis of Wound #1 is a Diabetic Wound/Ulcer of the Lower Extremity located on the Right T Great .Severity of Tissue Pre Debridement is: oe Fat layer exposed. There was a Excisional Skin/Subcutaneous Tissue Debridement with a total area of 0.25 sq cm performed by Kalman Shan, DO. With the following instrument(s): Curette to remove Viable and Non-Viable tissue/material. Material removed includes Callus, Subcutaneous Tissue, Skin: Dermis, Skin: Epidermis, and Fibrin/Exudate after achieving pain control using Lidocaine 4% T opical Solution. A time out was conducted at 09:22, prior to the start of the procedure. A Moderate amount of bleeding was controlled with Silver Nitrate. The procedure was tolerated well with a pain level of 0 throughout and a pain level of 0 following the procedure. Post Debridement Measurements: 0.3cm length x 0.3cm width x 0.4cm depth; 0.028cm^3 volume. Character of Wound/Ulcer Post Debridement is improved. Severity of Tissue Post Debridement is: Fat layer exposed. Post procedure Diagnosis Wound #1: Same as Pre-Procedure Plan Follow-up Appointments: Return Appointment in 1 week. Bathing/ Shower/ Hygiene: May shower and wash wound with soap and water. - on days when dressing is  changed. Off-Loading: Open toe surgical shoe to: - right foot, felt donut to shoe to offload. Continue to wear surgical shoe while walking and standing. ensure no pressure to right great toe. WOUND #1: - T Great Wound Laterality: Right oe Prim Dressing: Hydrofera Blue Classic Foam, 2x2 in Every Other Day/15 Days ary Discharge Instructions: Moisten with saline prior to applying to wound bed Secondary Dressing: Woven Gauze Sponges 2x2 in (Generic) Every Other Day/15 Days Discharge Instructions: Apply over primary dressing as directed. Secondary Dressing: Optifoam Non-Adhesive Dressing, 4x4 in Every Other Day/15 Days Discharge Instructions: ***Apply foam donut*** Apply over primary dressing as directed. Secured With: Child psychotherapist, Sterile 2x75 (in/in) (Generic) Every Other Day/15 Days Discharge Instructions: Secure with stretch gauze as directed. Secured With: Paper T ape, 2x10 (in/yd) (Generic) Every Other Day/15 Days Discharge Instructions: Secure dressing with tape as directed. 1. Hydrofera Blue every other day 2. In office sharp debridement 3. Follow-up in 1 week Electronic Signature(s) Signed: 12/08/2020 9:47:32 AM By: Kalman Shan DO Entered By: Kalman Shan on 12/08/2020 09:45:59 -------------------------------------------------------------------------------- HxROS Details Patient Name: Date of Service: [name] 12/08/2020 8:30 A M Medical Record Number: [id number] Patient Account Number: [id number] Date of Birth/Sex: Treating RN: [birth year] (50 y.o. Benjaman Lobe Primary Care Provider:  [name] Other Clinician: Referring Provider: Treating Provider/Extender: [name] in Treatment: 2 Information Obtained From Patient Constitutional Symptoms (General Health) Medical History: Past Medical History Notes: vitamin D deficiency Eyes Medical History: Negative for: Cataracts; Glaucoma; Optic  Neuritis Past Medical History Notes: hypertensive retinopathy Hematologic/Lymphatic Medical History: Positive for: Anemia Respiratory Medical History: Positive for: Asthma; Sleep Apnea - CPAP Cardiovascular Medical History: Positive for: Coronary Artery Disease; Hypertension Past Medical History Notes: hyperlipidemia Endocrine Medical History: Positive for: Type II Diabetes Negative for: Type I Diabetes Past Medical History Notes: left thyroid nodule Time with diabetes: since age 33 Blood sugar tested every day: Yes Tested : 2-3 times per day Genitourinary Medical History: Negative for: End Stage Renal Disease Past Medical History Notes: CKD Integumentary (Skin) Medical History: Negative for: History of Burn Musculoskeletal Medical History: Positive for: Osteoarthritis Neurologic Medical History: Positive for: Neuropathy Past Medical History Notes: stroke Oncologic Medical History: Negative for: Received Chemotherapy; Received Radiation Psychiatric Medical History: Positive for: Confinement Anxiety Negative for: Anorexia/bulimia Immunizations Pneumococcal Vaccine: Received Pneumococcal Vaccination: No Implantable Devices No devices added Hospitalization / Surgery History Type of Hospitalization/Surgery achilles tendon rupture right Family and Social History Cancer: Yes - Maternal Grandparents; Diabetes: Yes - Mother,Father,Maternal Grandparents,Paternal Grandparents; Heart Disease: Yes - Mother,Father,Maternal Grandparents,Paternal Grandparents,Siblings; Hereditary Spherocytosis: No; Hypertension: Yes - Mother,Father; Kidney Disease: Yes - Maternal Grandparents; Lung Disease: No; Seizures: No; Stroke: Yes - Maternal Grandparents,Mother; Thyroid Problems: No; Tuberculosis: No; Former smoker - quit 3-4 yrs ago; Marital Status - Single; Alcohol Use: Rarely; Drug Use: Current History - marijuana; Caffeine Use: Rarely; Financial Concerns: No; Food, Clothing or  Shelter Needs: No; Support System Lacking: No; Transportation Concerns: No Electronic Signature(s) Signed: 12/08/2020 9:47:32 AM By: Kalman Shan DO Signed: 12/11/2020 3:14:58 PM By: Rhae Hammock RN Entered By: Kalman Shan on 12/08/2020 09:41:33 -------------------------------------------------------------------------------- SuperBill Details Patient Name: Date of Service: MA [name], Lynn 12/08/2020 Medical Record Number: [id number] Patient Account Number: [id number] Date of Birth/Sex: Treating RN: 1970/10/12 (50 y.o. [name], [name] Primary Care Provider: [name] Other Clinician: Referring Provider: Treating Provider/Extender: [name] in Treatment: 2 Diagnosis Coding ICD-10 Codes Code Description [phone] Type 2 diabetes mellitus with foot ulcer I63.9 Cerebral infarction, unspecified I10 Essential (primary) hypertension Facility Procedures CPT4 Code: [id number] Description: [id number] - DEB SUBQ TISSUE 20 SQ CM/< ICD-10 Diagnosis Description E11.621 Type 2 diabetes mellitus with foot ulcer Modifier: Quantity: 1 Physician Procedures : CPT4 Code Description Modifier [id number] - WC PHYS SUBQ TISS 20 SQ CM ICD-10 Diagnosis Description E11.621 Type 2 diabetes mellitus with foot ulcer Quantity: 1 Electronic Signature(s) Signed: 12/08/2020 9:47:32 AM By: Kalman Shan DO Entered By: Kalman Shan on 12/08/2020 09:46:34

## 2020-12-11 NOTE — Progress Notes (Signed)
[name], [name] ([id number]) Visit Report for 12/08/2020 Arrival Information Details Patient Name: Date of Service: MA [name] NDA E. 12/08/2020 8:30 A M Medical Record Number: [id number] Patient Account Number: [id number] Date of Birth/Sex: Treating RN: 12-17-1970 (50 y.o. [name], [name] Primary Care Sherice Ijames: [name] Other Clinician: Referring Patra Gherardi: Treating Caraline Deutschman/Extender: [name] in Treatment: 2 Visit Information History Since Last Visit Added or deleted any medications: No Patient Arrived: Ambulatory Any new allergies or adverse reactions: No Arrival Time: 09:00 Had a fall or experienced change in No Accompanied By: self activities of daily living that may affect Transfer Assistance: None risk of falls: Patient Identification Verified: Yes Signs or symptoms of abuse/neglect since last visito No Secondary Verification Process Completed: Yes Hospitalized since last visit: No Patient Requires Transmission-Based Precautions: No Implantable device outside of the clinic excluding No Patient Has Alerts: No cellular tissue based products placed in the center since last visit: Has Dressing in Place as Prescribed: Yes Pain Present Now: No Notes MRI scheduled for Friday 12/11/2020. Electronic Signature(s) Signed: 12/09/2020 6:54:10 PM By: Deon Pilling Entered By: Deon Pilling on 12/08/2020 09:11:12 -------------------------------------------------------------------------------- Encounter Discharge Information Details Patient Name: Date of Service: MA [name] NDA E. 12/08/2020 8:30 A M Medical Record Number: [id number] Patient Account Number: [id number] Date of Birth/Sex: Treating RN: 06/01/1971 (50 y.o. [name] Primary Care Nariyah Osias: [name] Other Clinician: Referring Demont Linford: Treating Khara Renaud/Extender: [name] in Treatment: 2 Encounter Discharge Information Items Post  Procedure Vitals Discharge Condition: Stable Temperature (F): 98 Ambulatory Status: Ambulatory Pulse (bpm): 88 Discharge Destination: Home Respiratory Rate (breaths/min): 16 Transportation: Private Auto Blood Pressure (mmHg): 193/93 Accompanied By: self Schedule Follow-up Appointment: Yes Clinical Summary of Care: Electronic Signature(s) Signed: 12/09/2020 6:54:10 PM By: Deon Pilling Entered By: Deon Pilling on 12/08/2020 09:35:50 -------------------------------------------------------------------------------- Lower Extremity Assessment Details Patient Name: Date of Service: MA [name] [facility] NDA E. 12/08/2020 8:30 A M Medical Record Number: [id number] Patient Account Number: [id number] Date of Birth/Sex: Treating RN: January 20, 1971 (50 y.o. [name], [name] Primary Care Ruben Mahler: [name] Other Clinician: Referring Shaena Parkerson: Treating Hue Frick/Extender: [name] in Treatment: 2 Edema Assessment Assessed: [Left: No] [Right: Yes] Edema: [Left: Ye] [Right: s] Calf Left: Right: Point of Measurement: From Medial Instep 34 cm Ankle Left: Right: Point of Measurement: From Medial Instep 23.5 cm Vascular Assessment Pulses: Dorsalis Pedis Palpable: [Right:Yes] Electronic Signature(s) Signed: 12/09/2020 6:54:10 PM By: Deon Pilling Entered By: Deon Pilling on 12/08/2020 09:15:10 -------------------------------------------------------------------------------- Multi Wound Chart Details Patient Name: Date of Service: MA [name] NDA E. 12/08/2020 8:30 A M Medical Record Number: [id number] Patient Account Number: [id number] Date of Birth/Sex: Treating RN: May 15, 1971 (50 y.o. [name], [name] Primary Care Willie Loy: [name] Other Clinician: Referring Dollie Bressi: Treating Lisabeth Mian/Extender: [name] in Treatment: 2 Vital Signs Height(in): 69 Capillary Blood Glucose(mg/dl): 274 Weight(lbs):  250 Pulse(bpm): 99 Body Mass Index(BMI): 67 Blood Pressure(mmHg): 193/93 Temperature(F): 98 Respiratory Rate(breaths/min): 16 Photos: [1:No Photos Right T Great oe] [N/A:N/A N/A] Wound Location: [1:Puncture] [N/A:N/A] Wounding Event: [1:Diabetic Wound/Ulcer of the Lower] [N/A:N/A] Primary Etiology: [1:Extremity Anemia, Asthma, Sleep Apnea,] [N/A:N/A] Comorbid History: [1:Coronary Artery Disease, Hypertension, Type II Diabetes, Osteoarthritis, Neuropathy, Confinement Anxiety 07/09/2020] [N/A:N/A] Date Acquired: [1:2] [N/A:N/A] Weeks of Treatment: [1:Open] [N/A:N/A] Wound Status: [1:0.3x0.3x0.5] [N/A:N/A] Measurements L x W x D (cm) [1:0.071] [N/A:N/A] A (cm) : rea [1:0.035] [N/A:N/A] Volume (cm) : [1:43.70%] [N/A:N/A] % Reduction in A rea: [1:60.20%] [N/A:N/A] % Reduction in Volume: [1:12] Starting  Position 1 (o'clock): [1:2] Ending Position 1 (o'clock): [1:0.6] Maximum Distance 1 (cm): [1:Yes] [N/A:N/A] Undermining: [1:Grade 2] [N/A:N/A] Classification: [1:Small] [N/A:N/A] Exudate A mount: [1:Serosanguineous] [N/A:N/A] Exudate Type: [1:red, brown] [N/A:N/A] Exudate Color: [1:Well defined, not attached] [N/A:N/A] Wound Margin: [1:Large (67-100%)] [N/A:N/A] Granulation A mount: [1:Red, Pink] [N/A:N/A] Granulation Quality: [1:Small (1-33%)] [N/A:N/A] Necrotic A mount: [1:Fat Layer (Subcutaneous Tissue): Yes N/A] Exposed Structures: [1:Fascia: No Tendon: No Muscle: No Joint: No Bone: No Small (1-33%)] [N/A:N/A] Epithelialization: [1:Debridement - Excisional] [N/A:N/A] Debridement: Pre-procedure Verification/Time Out 09:22 [N/A:N/A] Taken: [1:Lidocaine 4% T opical Solution] [N/A:N/A] Pain Control: [1:Callus, Subcutaneous] [N/A:N/A] Tissue Debrided: [1:Skin/Subcutaneous Tissue] [N/A:N/A] Level: [1:0.25] [N/A:N/A] Debridement A (sq cm): [1:rea Curette] [N/A:N/A] Instrument: [1:Moderate] [N/A:N/A] Bleeding: [1:Silver Nitrate] [N/A:N/A] Hemostasis A chieved: [1:0]  [N/A:N/A] Procedural Pain: [1:0] [N/A:N/A] Post Procedural Pain: [1:Procedure was tolerated well] [N/A:N/A] Debridement Treatment Response: [1:0.3x0.3x0.4] [N/A:N/A] Post Debridement Measurements L x W x D (cm) [1:0.028] [N/A:N/A] Post Debridement Volume: (cm) [1:callous to periwound.] [N/A:N/A] Assessment Notes: [1:Debridement] [N/A:N/A] Treatment Notes Wound #1 (Toe Great) Wound Laterality: Right Cleanser Peri-Wound Care Topical Primary Dressing Hydrofera Blue Classic Foam, 2x2 in Discharge Instruction: Moisten with saline prior to applying to wound bed Secondary Dressing Woven Gauze Sponges 2x2 in Discharge Instruction: Apply over primary dressing as directed. Optifoam Non-Adhesive Dressing, 4x4 in Discharge Instruction: ***Apply foam donut*** Apply over primary dressing as directed. Secured With Conforming Stretch Gauze Bandage, Sterile 2x75 (in/in) Discharge Instruction: Secure with stretch gauze as directed. Paper Tape, 2x10 (in/yd) Discharge Instruction: Secure dressing with tape as directed. Compression Wrap Compression Stockings Add-Ons Electronic Signature(s) Signed: 12/08/2020 9:47:32 AM By: Kalman Shan DO Signed: 12/11/2020 3:14:58 PM By: Rhae Hammock RN Entered By: Kalman Shan on 12/08/2020 09:40:08 -------------------------------------------------------------------------------- Multi-Disciplinary Care Plan Details Patient Name: Date of Service: MA [name] NDA E. 12/08/2020 8:30 A M Medical Record Number: [id number] Patient Account Number: [id number] Date of Birth/Sex: Treating RN: 05-Apr-1971 (50 y.o. [name], [name] Primary Care Trine Fread: [name] Other Clinician: Referring Camaria Gerald: Treating Caliber Landess/Extender: [name] in Treatment: 2 Multidisciplinary Care Plan reviewed with physician Active Inactive Abuse / Safety / Falls / Self Care Management Nursing Diagnoses: History of Falls Potential for  falls Goals: Patient will not experience any injury related to falls Date Initiated: 11/19/2020 Target Resolution Date: 12/17/2020 Goal Status: Active Interventions: Assess fall risk on admission and as needed Assess impairment of mobility on admission and as needed per policy Notes: Nutrition Nursing Diagnoses: Impaired glucose control: actual or potential Potential for alteratiion in Nutrition/Potential for imbalanced nutrition Goals: Patient/caregiver will maintain therapeutic glucose control Date Initiated: 11/19/2020 Target Resolution Date: 12/17/2020 Goal Status: Active Interventions: Assess patient nutrition upon admission and as needed per policy Provide education on elevated blood sugars and impact on wound healing Provide education on nutrition Treatment Activities: Patient referred to Primary Care Physician for further nutritional evaluation : 11/19/2020 Notes: Wound/Skin Impairment Nursing Diagnoses: Impaired tissue integrity Knowledge deficit related to ulceration/compromised skin integrity Goals: Patient/caregiver will verbalize understanding of skin care regimen Date Initiated: 11/19/2020 Date Inactivated: 12/08/2020 Target Resolution Date: 12/17/2020 Goal Status: Met Ulcer/skin breakdown will have a volume reduction of 30% by week 4 Date Initiated: 11/19/2020 Target Resolution Date: 12/17/2020 Goal Status: Active Interventions: Assess patient/caregiver ability to obtain necessary supplies Assess patient/caregiver ability to perform ulcer/skin care regimen upon admission and as needed Assess ulceration(s) every visit Provide education on ulcer and skin care Treatment Activities: Skin care regimen initiated : 11/19/2020 Topical wound management initiated : 11/19/2020 Notes: Electronic Signature(s) Signed: 12/09/2020  6:54:10 PM By: Deon Pilling Entered By: Deon Pilling on 12/08/2020  09:18:16 -------------------------------------------------------------------------------- Pain Assessment Details Patient Name: Date of Service: MA [name] [facility] NDA E. 12/08/2020 8:30 A M Medical Record Number: [id number] Patient Account Number: [id number] Date of Birth/Sex: Treating RN: 20-Feb-1971 (50 y.o. [name] Primary Care Braiden Rodman: [name] Other Clinician: Referring Tachina Spoonemore: Treating Michell Kader/Extender: [name] in Treatment: 2 Active Problems Location of Pain Severity and Description of Pain Patient Has Paino No Site Locations Rate the pain. Current Pain Level: 0 Pain Management and Medication Current Pain Management: Medication: No Cold Application: No Rest: No Massage: No Activity: No T.E.N.S.: No Heat Application: No Leg drop or elevation: No Is the Current Pain Management Adequate: Adequate How does your wound impact your activities of daily livingo Sleep: No Bathing: No Appetite: No Relationship With Others: No Bladder Continence: No Emotions: No Bowel Continence: No Work: No Toileting: No Drive: No Dressing: No Hobbies: No Electronic Signature(s) Signed: 12/09/2020 6:54:10 PM By: Deon Pilling Entered By: Deon Pilling on 12/08/2020 09:12:12 -------------------------------------------------------------------------------- Patient/Caregiver Education Details Patient Name: Date of Service: MA [name] NDA E. 5/3/2022andnbsp8:30 A M Medical Record Number: [id number] Patient Account Number: [id number] Date of Birth/Gender: Treating RN: [birth year] (50 y.o. [name] Primary Care Physician: [name] Other Clinician: Referring Physician: Treating Physician/Extender: [name] in Treatment: 2 Education Assessment Education Provided To: Patient Education Topics Provided Elevated Blood Sugar/ Impact on Healing: Handouts: Elevated Blood Sugars: How Do  They Affect Wound Healing Methods: Explain/Verbal, Printed Responses: Reinforcements needed Electronic Signature(s) Signed: 12/09/2020 6:54:10 PM By: Deon Pilling Entered By: Deon Pilling on 12/08/2020 09:18:30 -------------------------------------------------------------------------------- Wound Assessment Details Patient Name: Date of Service: MA [name] NDA E. 12/08/2020 8:30 A M Medical Record Number: [id number] Patient Account Number: [id number] Date of Birth/Sex: Treating RN: 1970-12-25 (50 y.o. [name], [name] Primary Care Lavin Petteway: [name] Other Clinician: Referring Henna Derderian: Treating Theophilus Walz/Extender: [name] in Treatment: 2 Wound Status Wound Number: 1 Primary Diabetic Wound/Ulcer of the Lower Extremity Etiology: Wound Location: Right T Great oe Wound Open Wounding Event: Puncture Status: Date Acquired: 07/09/2020 Comorbid Anemia, Asthma, Sleep Apnea, Coronary Artery Disease, Weeks Of Treatment: 2 History: Hypertension, Type II Diabetes, Osteoarthritis, Neuropathy, Clustered Wound: No Confinement Anxiety Photos Wound Measurements Length: (cm) 0.3 Width: (cm) 0.3 Depth: (cm) 0.5 Area: (cm) 0.071 Volume: (cm) 0.035 % Reduction in Area: 43.7% % Reduction in Volume: 60.2% Epithelialization: Small (1-33%) Tunneling: No Undermining: Yes Starting Position (o'clock): 12 Ending Position (o'clock): 2 Maximum Distance: (cm) 0.6 Wound Description Classification: Grade 2 Wound Margin: Well defined, not attached Exudate Amount: Small Exudate Type: Serosanguineous Exudate Color: red, brown Foul Odor After Cleansing: No Slough/Fibrino Yes Wound Bed Granulation Amount: Large (67-100%) Exposed Structure Granulation Quality: Red, Pink Fascia Exposed: No Necrotic Amount: Small (1-33%) Fat Layer (Subcutaneous Tissue) Exposed: Yes Tendon Exposed: No Muscle Exposed: No Joint Exposed: No Bone Exposed: No Assessment  Notes callous to periwound. Treatment Notes Wound #1 (Toe Great) Wound Laterality: Right Cleanser Peri-Wound Care Topical Primary Dressing Hydrofera Blue Classic Foam, 2x2 in Discharge Instruction: Moisten with saline prior to applying to wound bed Secondary Dressing Woven Gauze Sponges 2x2 in Discharge Instruction: Apply over primary dressing as directed. Optifoam Non-Adhesive Dressing, 4x4 in Discharge Instruction: ***Apply foam donut*** Apply over primary dressing as directed. Secured With Conforming Stretch Gauze Bandage, Sterile 2x75 (in/in) Discharge Instruction: Secure with stretch gauze as directed. Paper Tape, 2x10 (in/yd) Discharge Instruction: Secure dressing with tape  as directed. Compression Wrap Compression Stockings Add-Ons Electronic Signature(s) Signed: 12/08/2020 4:47:39 PM By: Sandre Kitty Signed: 12/09/2020 6:54:10 PM By: Deon Pilling Entered By: Sandre Kitty on 12/08/2020 16:16:04 -------------------------------------------------------------------------------- Vitals Details Patient Name: Date of Service: MA [name] NDA E. 12/08/2020 8:30 A M Medical Record Number: [id number] Patient Account Number: [id number] Date of Birth/Sex: Treating RN: 1970/11/28 (50 y.o. [name], [name] Primary Care Linsey Hirota: [name] Other Clinician: Referring Leandro Berkowitz: Treating Marga Gramajo/Extender: [name] in Treatment: 2 Vital Signs Time Taken: 09:00 Temperature (F): 98 Height (in): 69 Pulse (bpm): 99 Weight (lbs): 250 Respiratory Rate (breaths/min): 16 Body Mass Index (BMI): 36.9 Blood Pressure (mmHg): 193/93 Capillary Blood Glucose (mg/dl): 274 Reference Range: 80 - 120 mg / dl Notes per patient blood glucose checked last night. Electronic Signature(s) Signed: 12/09/2020 6:54:10 PM By: Deon Pilling Entered By: Deon Pilling on 12/08/2020 09:12:01

## 2020-12-15 ENCOUNTER — Other Ambulatory Visit: Payer: Self-pay

## 2020-12-15 ENCOUNTER — Encounter (HOSPITAL_BASED_OUTPATIENT_CLINIC_OR_DEPARTMENT_OTHER): Payer: Medicaid Other | Admitting: Internal Medicine

## 2020-12-15 DIAGNOSIS — E11621 Type 2 diabetes mellitus with foot ulcer: Secondary | ICD-10-CM

## 2020-12-15 NOTE — Progress Notes (Signed)
MONQUIE, SCHMEISER ([id number]) Visit Report for 12/15/2020 Chief Complaint Document Details Patient Name: Date of Service: [name] 12/15/2020 8:00 A M Medical Record Number: [id number] Patient Account Number: [id number] Date of Birth/Sex: Treating RN: 1970/08/25 (50 y.o. [name] Primary Care Provider: Benito [name] Other Clinician: Referring Provider: Treating Provider/Extender: [name] in Treatment: 3 Information Obtained from: Patient Chief Complaint Right foot wound Electronic Signature(s) Signed: 12/15/2020 9:46:54 AM By: Teresa Shan DO Entered By: [name] on 12/15/2020 09:37:49 -------------------------------------------------------------------------------- Debridement Details Patient Name: Date of Service: [name] 12/15/2020 8:00 A M Medical Record Number: [id number] Patient Account Number: [id number] Date of Birth/Sex: Treating RN: [birth year] (50 y.o. [name] Primary Care Provider: Benito [name] Other Clinician: Referring Provider: Treating Provider/Extender: [name] in Treatment: 3 Debridement Performed for Assessment: Wound #1 Right T Great oe Performed By: Physician Teresa Shan, DO Debridement Type: Debridement Severity of Tissue Pre Debridement: Fat layer exposed Level of Consciousness (Pre-procedure): Awake and Alert Pre-procedure Verification/Time Out Yes - 08:55 Taken: Start Time: 08:56 Pain Control: Lidocaine 4% T opical Solution T Area Debrided (L x W): otal 1 (cm) x 1 (cm) = 1 (cm) Tissue and other material debrided: Viable, Non-Viable, Callus, Subcutaneous, Skin: Dermis , Skin: Epidermis, Fibrin/Exudate Level: Skin/Subcutaneous Tissue Debridement Description: Excisional Instrument: Blade, Curette Bleeding: Moderate Hemostasis Achieved: Pressure End Time: 09:00 Procedural Pain: 0 Post Procedural Pain: 0 Response to  Treatment: Procedure was tolerated well Level of Consciousness (Post- Awake and Alert procedure): Post Debridement Measurements of Total Wound Length: (cm) 0.6 Width: (cm) 0.4 Depth: (cm) 0.4 Volume: (cm) 0.075 Character of Wound/Ulcer Post Debridement: Improved Severity of Tissue Post Debridement: Fat layer exposed Post Procedure Diagnosis Same as Pre-procedure Electronic Signature(s) Signed: 12/15/2020 9:46:54 AM By: Teresa Shan DO Signed: 12/15/2020 6:10:24 PM By: [name] Entered By: [name] on 12/15/2020 09:00:59 -------------------------------------------------------------------------------- HPI Details Patient Name: Date of Service: [name] 12/15/2020 8:00 A M Medical Record Number: [id number] Patient Account Number: [id number] Date of Birth/Sex: Treating RN: August 12, 1970 (50 y.o. [name] Primary Care Provider: Benito [name] Other Clinician: Referring Provider: Treating Provider/Extender: [name] in Treatment: 3 History of Present Illness HPI Description: [name] is a 50 year old female with past medical history of insulin-dependent type 2 diabetes, previous left pontine CVA, essential hypertension that presents to the clinic today for evaluation of her right foot wound. She states that in December 2021 she had cut open her right great toe on a door step . It became infected and she was treated with antibiotics. She started following with podiatry in February and has been using Betadine daily on the wound. She is also using a surgical [name] to help with offloading. She has diabetic neuropathy and does not report pain to the wound. She denies any purulent drainage, fever/chills or increased warmth or erythema to the foot. 4/21; patient has been using silver alginate every other day with dressing changes. She denies any purulent drainage, fever/chills or increased warmth or erythema to the foot.  She does however notice an odor. 4/26; patient presents for 1 week follow-up. She continues to use silver alginate every other day without any issues. She denies purulent drainage, fever/chills or increased warmth or erythema to the foot. She does not notice an odor anymore. She has no complaints today. 5/3; patient presents for 1 week follow-up. She states that she has an MRI scheduled  for Friday. She has been using silver alginate every other day to the wound. She has noted more maceration. She denies any pain or acute signs of infection. 5/10; patient presents for 1 week follow-up. Her MRI had to be rescheduled due to needing a prior authorization. It is scheduled for 5/19. Patient has been using Hydrofera Blue every other day. She denies acute signs of infection. She has no complaints today. Electronic Signature(s) Signed: 12/15/2020 9:46:54 AM By: Teresa Shan DO Entered By: [name] on 12/15/2020 09:39:46 -------------------------------------------------------------------------------- Physical Exam Details Patient Name: Date of Service: [name] 12/15/2020 8:00 A M Medical Record Number: [id number] Patient Account Number: [id number] Date of Birth/Sex: Treating RN: Nov 21, 1970 (50 y.o. [name] Primary Care Provider: Benito [name] Other Clinician: Referring Provider: Treating Provider/Extender: [name] in Treatment: 3 Constitutional respirations regular, non-labored and within target range for patient.. Cardiovascular 2+ dorsalis pedis/posterior tibialis pulses. Psychiatric pleasant and cooperative. Notes Right great toe: Wound on the plantar aspect. There continues to be significant callus although improvement from last clinic visit. This was extensively debrided. There was undermining and the surrounding callus was removed. No signs of infection. Granulation tissue present Electronic Signature(s) Signed:  12/15/2020 9:46:54 AM By: Teresa Shan DO Entered By: [name] on 12/15/2020 09:41:12 -------------------------------------------------------------------------------- Physician Orders Details Patient Name: Date of Service: [name] 12/15/2020 8:00 A M Medical Record Number: [id number] Patient Account Number: [id number] Date of Birth/Sex: Treating RN: 1970/10/20 (50 y.o. 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 Bathing/ Shower/ Hygiene May shower and wash wound with soap and water. - on days when dressing is changed. Edema Control - Lymphedema / SCD / Other Elevate legs to the level of the heart or above for 30 minutes daily and/or when sitting, a frequency of: - throughout the day. Avoid standing for long periods of time. Off-Loading Wedge [name] to: - ***today in clinic please provide patient with a front off loader.*** Patient to wear while standing and walking. Minimize standing and walking on the foot/toe.*** Wound Treatment Wound #1 - T Great oe Wound Laterality: Right Prim Dressing: KerraCel Ag Gelling Fiber Dressing, 2x2 in (silver alginate) Every Other Day/15 Days ary Discharge Instructions: Apply silver alginate to wound bed as instructed Secondary Dressing: Woven Gauze Sponges 2x2 in (Generic) Every Other Day/15 Days Discharge Instructions: Apply over primary dressing as directed. Secondary Dressing: Optifoam Non-Adhesive Dressing, 4x4 in Every Other Day/15 Days Discharge  Instructions: ***Apply foam donut*** Apply over primary dressing as directed. Secured With: Child psychotherapist, Sterile 2x75 (in/in) (Generic) Every Other Day/15 Days Discharge Instructions: Secure with stretch gauze as directed. Secured With: Paper Tape, 2x10 (in/yd) (Generic) Every Other Day/15 Days Discharge Instructions: Secure dressing with tape as directed. Electronic Signature(s) Signed: 12/15/2020 9:46:54 AM By: Teresa Shan DO Entered By: [name] on 12/15/2020 09:41:26 -------------------------------------------------------------------------------- Problem List Details Patient Name: Date of Service: [name] 12/15/2020 8:00 A M Medical Record Number: [id number] Patient Account Number: [id number] Date of Birth/Sex: Treating RN: 03-13-1971 (50 y.o. 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 12/15/2020 8:00 A M Medical Record Number: [id number] Patient Account Number: [id number] Date of Birth/Sex: Treating RN: 14-Nov-1970 (50 y.o. [name] Primary Care Provider: Benito [name] Other Clinician: Referring  Provider: Treating Provider/Extender: [name] in Treatment: 3 Subjective Chief Complaint Information obtained from Patient Right foot wound History of Present Illness (HPI) Ms. Katielynn Nattress is a 50 year old female with past medical history of insulin-dependent type 2 diabetes, previous left pontine CVA, essential hypertension that presents to the clinic today for evaluation of her right foot wound. She states that in December 2021 she had cut open her right great toe on a door step . It became infected and she was treated with antibiotics. She started following with podiatry in February and has been using Betadine daily on the wound. She is also using a surgical [name] to help with offloading. She has diabetic neuropathy and does not report pain to the wound. She denies any purulent drainage, fever/chills or increased warmth or erythema to the foot. 4/21; patient has been using silver alginate every other day with dressing changes. She denies any purulent drainage, fever/chills or increased warmth or erythema to the foot. She does however notice an odor. 4/26; patient presents for 1 week follow-up. She continues to use silver alginate every other day without any issues. She denies purulent drainage, fever/chills or increased warmth or erythema to the foot. She does not notice an odor anymore. She has no complaints today. 5/3; patient presents for 1 week follow-up. She states that she has an MRI scheduled for Friday. She has been using silver alginate every other day to the wound. She has noted more maceration. She denies any pain or acute signs of infection. 5/10; patient presents for 1 week follow-up. Her MRI had to be rescheduled due to needing a prior authorization. It is scheduled for 5/19. Patient has been using Hydrofera Blue every other day. She denies acute signs of infection. She has no complaints today. Patient History Information obtained from  Patient. Family History Cancer - Maternal Grandparents, Diabetes - Mother,Father,Maternal Grandparents,Paternal Grandparents, Heart Disease - Mother,Father,Maternal Grandparents,Paternal Grandparents,Siblings, Hypertension - Mother,Father, Kidney Disease - Maternal Grandparents, Stroke - Maternal Grandparents,Mother, No family history of Hereditary Spherocytosis, Lung Disease, Seizures, Thyroid Problems, Tuberculosis. Social History Former smoker - quit 3-4 yrs ago, Marital Status - Single, Alcohol Use - Rarely, Drug Use - Current History - marijuana, Caffeine Use - Rarely. Medical History Eyes Denies history of Cataracts, Glaucoma, Optic Neuritis Hematologic/Lymphatic Patient has history of Anemia Respiratory Patient has history of Asthma, Sleep Apnea - CPAP Cardiovascular Patient has history of Coronary Artery Disease, Hypertension Endocrine Patient has history of Type II Diabetes Denies history of Type I Diabetes Genitourinary Denies history of End Stage Renal Disease Integumentary (Skin) Denies history of History of Burn Musculoskeletal Patient has history of Osteoarthritis Neurologic Patient has history of Neuropathy Oncologic Denies history of Received Chemotherapy, Received Radiation Psychiatric Patient has history  of Confinement Anxiety Denies history of Anorexia/bulimia Hospitalization/Surgery History - achilles tendon rupture right. Medical A Surgical History Notes nd Constitutional Symptoms (General Health) vitamin D deficiency Eyes hypertensive retinopathy Cardiovascular hyperlipidemia Endocrine left thyroid nodule Genitourinary CKD Neurologic stroke Objective Constitutional respirations regular, non-labored and within target range for patient.. Vitals Time Taken: 8:26 AM, Height: 69 in, Weight: 250 lbs, BMI: 36.9, Temperature: 97.7 F, Pulse: 84 bpm, Respiratory Rate: 16 breaths/min, Blood Pressure: 189/82 mmHg, Capillary Blood Glucose: 102  mg/dl. Cardiovascular 2+ dorsalis pedis/posterior tibialis pulses. Psychiatric pleasant and cooperative. General Notes: Right great toe: Wound on the plantar aspect. There continues to be significant callus although improvement from last clinic visit. This was extensively debrided. There was undermining and the surrounding callus was removed. No signs of infection. Granulation tissue present Integumentary (Hair, Skin) Wound #1 status is Open. Original cause of wound was Puncture. The date acquired was: 07/09/2020. The wound has been in treatment 3 weeks. The wound is located on the Right T Great. The wound measures 0.6cm length x 0.4cm width x 0.4cm depth; 0.188cm^2 area and 0.075cm^3 volume. There is Fat Layer oe (Subcutaneous Tissue) exposed. There is no tunneling or undermining noted. There is a medium amount of serosanguineous drainage noted. The wound margin is well defined and not attached to the wound base. There is large (67-100%) red, pink granulation within the wound bed. There is a small (1-33%) amount of necrotic tissue within the wound bed including Adherent Slough. 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 T oday the wound was extensively debrided. The measurements will be much larger than on intake. Overall there are no signs of infection. She does have an extensive amount of callus to the distal portion of the toe. I am concerned that the wound extends under it. I removed as much as I could but this is a very difficult area to debride. At this time will revert back to silver alginate as this is easier to pack in the wound. We will see her back in a week. Procedures Wound #1 Pre-procedure diagnosis of Wound #1 is a Diabetic Wound/Ulcer of the Lower  Extremity located on the Right T Great .Severity of Tissue Pre Debridement is: oe Fat layer exposed. There was a Excisional Skin/Subcutaneous Tissue Debridement with a total area of 1 sq cm performed by Teresa Shan, DO. With the following instrument(s): Blade, and Curette to remove Viable and Non-Viable tissue/material. Material removed includes Callus, Subcutaneous Tissue, Skin: Dermis, Skin: Epidermis, and Fibrin/Exudate after achieving pain control using Lidocaine 4% T opical Solution. A time out was conducted at 08:55, prior to the start of the procedure. A Moderate amount of bleeding was controlled with Pressure. The procedure was tolerated well with a pain level of 0 throughout and a pain level of 0 following the procedure. Post Debridement Measurements: 0.6cm length x 0.4cm width x 0.4cm depth; 0.075cm^3 volume. Character of Wound/Ulcer Post Debridement is improved. Severity of Tissue Post Debridement is: Fat layer exposed. Post procedure Diagnosis Wound #1: Same as Pre-Procedure Plan Follow-up Appointments: Return Appointment in 1 week. Bathing/ Shower/ Hygiene: May shower and wash wound with soap and water. - on days when dressing is changed. Edema Control - Lymphedema / SCD / Other: Elevate legs to the level of the heart or above for 30 minutes daily and/or when sitting,  a frequency of: - throughout the day. Avoid standing for long periods of time. Off-Loading: Wedge [name] to: - ***today in clinic please provide patient with a front off loader.*** Patient to wear while standing and walking. Minimize standing and walking on the foot/toe.*** WOUND #1: - T Great Wound Laterality: Right oe Prim Dressing: KerraCel Ag Gelling Fiber Dressing, 2x2 in (silver alginate) Every Other Day/15 Days ary Discharge Instructions: Apply silver alginate to wound bed as instructed Secondary Dressing: Woven Gauze Sponges 2x2 in (Generic) Every Other Day/15 Days Discharge Instructions: Apply over  primary dressing as directed. Secondary Dressing: Optifoam Non-Adhesive Dressing, 4x4 in Every Other Day/15 Days Discharge Instructions: ***Apply foam donut*** Apply over primary dressing as directed. Secured With: Child psychotherapist, Sterile 2x75 (in/in) (Generic) Every Other Day/15 Days Discharge Instructions: Secure with stretch gauze as directed. Secured With: Paper T ape, 2x10 (in/yd) (Generic) Every Other Day/15 Days Discharge Instructions: Secure dressing with tape as directed. 1. Silver alginate 2. In office sharp debridement 3. Follow-up in 1 week Electronic Signature(s) Signed: 12/15/2020 9:46:54 AM By: Teresa Shan DO Entered By: [name] on 12/15/2020 09:45:45 -------------------------------------------------------------------------------- HxROS Details Patient Name: Date of Service: [name] 12/15/2020 8:00 A M Medical Record Number: [id number] Patient Account Number: [id number] Date of Birth/Sex: Treating RN: 1970/09/10 (50 y.o. [name] Primary Care Provider: Benito [name] Other Clinician: Referring Provider: Treating Provider/Extender: [name] in Treatment: 3 Information Obtained From Patient Constitutional Symptoms (General Health) Medical History: Past Medical History Notes: vitamin D deficiency Eyes Medical History: Negative for: Cataracts; Glaucoma; Optic Neuritis Past Medical History Notes: hypertensive retinopathy Hematologic/Lymphatic Medical History: Positive for: Anemia Respiratory Medical History: Positive for: Asthma; Sleep Apnea - CPAP Cardiovascular Medical History: Positive for: Coronary Artery Disease; Hypertension Past Medical History Notes: hyperlipidemia Endocrine Medical History: Positive for: Type II Diabetes Negative for: Type I Diabetes Past Medical History Notes: left thyroid nodule Time with diabetes: since age 2 Blood sugar tested every  day: Yes Tested : 2-3 times per day Genitourinary Medical History: Negative for: End Stage Renal Disease Past Medical History Notes: CKD Integumentary (Skin) Medical History: Negative for: History of Burn Musculoskeletal Medical History: Positive for: Osteoarthritis Neurologic Medical History: Positive for: Neuropathy Past Medical History Notes: stroke Oncologic Medical History: Negative for: Received Chemotherapy; Received Radiation Psychiatric Medical History: Positive for: Confinement Anxiety Negative for: Anorexia/bulimia Immunizations Pneumococcal Vaccine: Received Pneumococcal Vaccination: No Implantable Devices No devices added Hospitalization / Surgery History Type of Hospitalization/Surgery achilles tendon rupture right Family and Social History Cancer: Yes - Maternal Grandparents; Diabetes: Yes - Mother,Father,Maternal Grandparents,Paternal Grandparents; Heart Disease: Yes - Mother,Father,Maternal Grandparents,Paternal Grandparents,Siblings; Hereditary Spherocytosis: No; Hypertension: Yes - Mother,Father; Kidney Disease: Yes - Maternal Grandparents; Lung Disease: No; Seizures: No; Stroke: Yes - Maternal Grandparents,Mother; Thyroid Problems: No; Tuberculosis: No; Former smoker - quit 3-4 yrs ago; Marital Status - Single; Alcohol Use: Rarely; Drug Use: Current History - marijuana; Caffeine Use: Rarely; Financial Concerns: No; Food, Clothing or Shelter Needs: No; Support System Lacking: No; Transportation Concerns: No Electronic Signature(s) Signed: 12/15/2020 9:46:54 AM By: Teresa Shan DO Signed: 12/15/2020 6:10:24 PM By: [name] Entered By: [name] on 12/15/2020 09:39:55 -------------------------------------------------------------------------------- SuperBill Details Patient Name: Date of Service: [name] 12/15/2020 Medical Record Number: [id number] Patient Account Number: [id number] Date of Birth/Sex: Treating RN: June 03, 1971  (50 y.o. 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

## 2020-12-16 NOTE — Progress Notes (Signed)
CELESTA, [place] ([id number]) Visit Report for 12/15/2020 Arrival Information Details Patient Name: Date of Service: [name] 12/15/2020 8:00 A M Medical Record Number: [id number] Patient Account Number: [id number] Date of Birth/Sex: Treating RN: 04-07-1971 (50 y.o. [name], Meta.Reding Primary Care Samanyu Tinnell: [name] Other Clinician: Referring Dayzha Pogosyan: Treating Shanise Balch/Extender: [name] in Treatment: 3 Visit Information History Since Last Visit Added or deleted any medications: No Patient Arrived: Ambulatory Any new allergies or adverse reactions: No Arrival Time: 08:26 Had a fall or experienced change in No Accompanied By: self activities of daily living that may affect Transfer Assistance: None risk of falls: Patient Identification Verified: Yes Signs or symptoms of abuse/neglect since last visito No Secondary Verification Process Completed: Yes Hospitalized since last visit: No Patient Requires Transmission-Based Precautions: No Implantable device outside of the clinic excluding No Patient Has Alerts: No cellular tissue based products placed in the center since last visit: Has Dressing in Place as Prescribed: Yes Pain Present Now: No Electronic Signature(s) Signed: 12/15/2020 8:51:56 AM By: Sandre Kitty Entered By: Sandre Kitty on 12/15/2020 08:26:47 -------------------------------------------------------------------------------- Encounter Discharge Information Details Patient Name: Date of Service: [name] 12/15/2020 8:00 A M Medical Record Number: [id number] Patient Account Number: [id number] Date of Birth/Sex: Treating RN: 10/13/1970 (50 y.o. [name] Primary Care Ada Holness: [name] Other Clinician: Referring Season Astacio: Treating Annis Lagoy/Extender: [name] in Treatment: 3 Encounter Discharge Information Items Post Procedure Vitals Discharge  Condition: Stable Temperature (F): 97.7 Ambulatory Status: Ambulatory Pulse (bpm): 84 Discharge Destination: Home Respiratory Rate (breaths/min): 16 Transportation: Private Auto Blood Pressure (mmHg): 189/82 Schedule Follow-up Appointment: Yes Clinical Summary of Care: Provided on 12/15/2020 Form Type Recipient Paper Patient Patient Electronic Signature(s) Signed: 12/15/2020 10:01:06 AM By: Lorrin Jackson Entered By: Lorrin Jackson on 12/15/2020 10:01:06 -------------------------------------------------------------------------------- Lower Extremity Assessment Details Patient Name: Date of Service: [name] 12/15/2020 8:00 A M Medical Record Number: [id number] Patient Account Number: [id number] Date of Birth/Sex: Treating RN: Jan 12, 1971 (50 y.o. [name] Primary Care Eevie Lapp: [name] Other Clinician: Referring Abe Schools: Treating Leiland Mihelich/Extender: [name] in Treatment: 3 Edema Assessment Assessed: [Left: No] [Right: No] Edema: [Left: N] [Right: o] Calf Left: Right: Point of Measurement: From Medial Instep 36.5 cm Ankle Left: Right: Point of Measurement: From Medial Instep 23 cm Vascular Assessment Pulses: Dorsalis Pedis Palpable: [Right:Yes] Electronic Signature(s) Signed: 12/15/2020 5:48:51 PM By: Baruch Gouty RN, BSN Entered By: Baruch Gouty on 12/15/2020 08:45:08 -------------------------------------------------------------------------------- Multi Wound Chart Details Patient Name: Date of Service: [name] 12/15/2020 8:00 A M Medical Record Number: [id number] Patient Account Number: [id number] Date of Birth/Sex: Treating RN: November 14, 1970 (50 y.o. [name] Primary Care Jobeth Pangilinan: [name] Other Clinician: Referring Jasyah Theurer: Treating Britt Petroni/Extender: [name] in Treatment: 3 Vital Signs Height(in): 63 Capillary Blood  Glucose(mg/dl): 102 Weight(lbs): 250 Pulse(bpm): 85 Body Mass Index(BMI): 40 Blood Pressure(mmHg): 189/82 Temperature(F): 97.7 Respiratory Rate(breaths/min): 16 Photos: [1:No Photos Right T Great oe] [N/A:N/A N/A] Wound Location: [1:Puncture] [N/A:N/A] Wounding Event: [1:Diabetic Wound/Ulcer of the Lower] [N/A:N/A] Primary Etiology: [1:Extremity Anemia, Asthma, Sleep Apnea,] [N/A:N/A] Comorbid History: [1:Coronary Artery Disease, Hypertension, Type II Diabetes, Osteoarthritis, Neuropathy, Confinement Anxiety 07/09/2020] [N/A:N/A] Date Acquired: [1:3] [N/A:N/A] Weeks of Treatment: [1:Open] [N/A:N/A] Wound Status: [1:0.6x0.4x0.4] [N/A:N/A] Measurements L x W x D (cm) [1:0.188] [N/A:N/A] A (cm) : rea [1:0.075] [N/A:N/A] Volume (cm) : [1:-49.20%] [N/A:N/A] % Reduction in A rea: [1:14.80%] [N/A:N/A] % Reduction in Volume: [  1:Grade 2] [N/A:N/A] Classification: [1:Medium] [N/A:N/A] Exudate A mount: [1:Serosanguineous] [N/A:N/A] Exudate Type: [1:red, brown] [N/A:N/A] Exudate Color: [1:Well defined, not attached] [N/A:N/A] Wound Margin: [1:Large (67-100%)] [N/A:N/A] Granulation A mount: [1:Red, Pink] [N/A:N/A] Granulation Quality: [1:Small (1-33%)] [N/A:N/A] Necrotic A mount: [1:Fat Layer (Subcutaneous Tissue): Yes N/A] Exposed Structures: [1:Fascia: No Tendon: No Muscle: No Joint: No Bone: No Small (1-33%)] [N/A:N/A] Epithelialization: [1:Debridement - Excisional] [N/A:N/A] Debridement: Pre-procedure Verification/Time Out 08:55 [N/A:N/A] Taken: [1:Lidocaine 4% T opical Solution] [N/A:N/A] Pain Control: [1:Callus, Subcutaneous] [N/A:N/A] Tissue Debrided: [1:Skin/Subcutaneous Tissue] [N/A:N/A] Level: [1:1] [N/A:N/A] Debridement A (sq cm): [1:rea Blade, Curette] [N/A:N/A] Instrument: [1:Moderate] [N/A:N/A] Bleeding: [1:Pressure] [N/A:N/A] Hemostasis A chieved: [1:0] [N/A:N/A] Procedural Pain: [1:0] [N/A:N/A] Post Procedural Pain: [1:Procedure was tolerated well]  [N/A:N/A] Debridement Treatment Response: [1:0.6x0.4x0.4] [N/A:N/A] Post Debridement Measurements L x W x D (cm) [1:0.075] [N/A:N/A] Post Debridement Volume: (cm) [1:Debridement] [N/A:N/A] Treatment Notes Electronic Signature(s) Signed: 12/15/2020 9:46:54 AM By: Kalman Shan DO Signed: 12/15/2020 6:10:24 PM By: Deon Pilling Entered By: Kalman Shan on 12/15/2020 09:37:40 -------------------------------------------------------------------------------- Multi-Disciplinary Care Plan Details Patient Name: Date of Service: [name] 12/15/2020 8:00 A M Medical Record Number: [id number] Patient Account Number: [id number] Date of Birth/Sex: Treating RN: 01-12-1971 (50 y.o. [name], [name] Primary Care Sante Biedermann: [name] Other Clinician: Referring Shady Padron: Treating Lida Berkery/Extender: [name] in Treatment: 3 Multidisciplinary Care Plan reviewed with physician Active Inactive Abuse / Safety / Falls / Self Care Management Nursing Diagnoses: History of Falls Potential for falls Goals: Patient will not experience any injury related to falls Date Initiated: 11/19/2020 Target Resolution Date: 01/01/2021 Goal Status: Active Interventions: Assess fall risk on admission and as needed Assess impairment of mobility on admission and as needed per policy Notes: Nutrition Nursing Diagnoses: Impaired glucose control: actual or potential Potential for alteratiion in Nutrition/Potential for imbalanced nutrition Goals: Patient/caregiver will maintain therapeutic glucose control Date Initiated: 11/19/2020 Target Resolution Date: 01/01/2021 Goal Status: Active Interventions: Assess patient nutrition upon admission and as needed per policy Provide education on elevated blood sugars and impact on wound healing Provide education on nutrition Treatment Activities: Patient referred to Primary Care Physician for further nutritional  evaluation : 11/19/2020 Notes: Wound/Skin Impairment Nursing Diagnoses: Impaired tissue integrity Knowledge deficit related to ulceration/compromised skin integrity Goals: Patient/caregiver will verbalize understanding of skin care regimen Date Initiated: 11/19/2020 Date Inactivated: 12/08/2020 Target Resolution Date: 12/17/2020 Goal Status: Met Ulcer/skin breakdown will have a volume reduction of 30% by week 4 Date Initiated: 11/19/2020 Target Resolution Date: 12/17/2020 Goal Status: Active Interventions: Assess patient/caregiver ability to obtain necessary supplies Assess patient/caregiver ability to perform ulcer/skin care regimen upon admission and as needed Assess ulceration(s) every visit Provide education on ulcer and skin care Treatment Activities: Skin care regimen initiated : 11/19/2020 Topical wound management initiated : 11/19/2020 Notes: Electronic Signature(s) Signed: 12/15/2020 6:10:24 PM By: Deon Pilling Entered By: Deon Pilling on 12/15/2020 08:08:34 -------------------------------------------------------------------------------- Pain Assessment Details Patient Name: Date of Service: [name] 12/15/2020 8:00 A M Medical Record Number: [id number] Patient Account Number: [id number] Date of Birth/Sex: Treating RN: 1970-12-02 (50 y.o. [name] Primary Care Rynn Markiewicz: [name] Other Clinician: Referring Cassy Sprowl: Treating Maridee Slape/Extender: [name] in Treatment: 3 Active Problems Location of Pain Severity and Description of Pain Patient Has Paino No Site Locations Pain Management and Medication Current Pain Management: Electronic Signature(s) Signed: 12/15/2020 8:51:56 AM By: Sandre Kitty Signed: 12/15/2020 6:10:24 PM By: Deon Pilling Entered By: Sandre Kitty on 12/15/2020 08:27:17 -------------------------------------------------------------------------------- Patient/Caregiver Education  Details Patient Name: Date of Service: MA [name] [facility] NDA E. 5/10/2022andnbsp8:00 A M Medical Record Number: [id number] Patient Account Number: [id number] Date of Birth/Gender: Treating RN: June 04, 1971 (50 y.o. [name] Primary Care Physician: [name] Other Clinician: Referring Physician: Treating Physician/Extender: [name] in Treatment: 3 Education Assessment Education Provided To: Patient Education Topics Provided Elevated Blood Sugar/ Impact on Healing: Handouts: Elevated Blood Sugars: How Do They Affect Wound Healing Methods: Explain/Verbal Responses: Reinforcements needed Electronic Signature(s) Signed: 12/15/2020 6:10:24 PM By: Deon Pilling Entered By: Deon Pilling on 12/15/2020 08:08:45 -------------------------------------------------------------------------------- Wound Assessment Details Patient Name: Date of Service: [name] 12/15/2020 8:00 A M Medical Record Number: [id number] Patient Account Number: [id number] Date of Birth/Sex: Treating RN: [birth year] (50 y.o. [name], [name] Primary Care Effrey Davidow: [name] Other Clinician: Referring Armiyah Capron: Treating Mckynna Vanloan/Extender: [name] in Treatment: 3 Wound Status Wound Number: 1 Primary Diabetic Wound/Ulcer of the Lower Extremity Etiology: Wound Location: Right T Great oe Wound Open Wounding Event: Puncture Status: Date Acquired: 07/09/2020 Comorbid Anemia, Asthma, Sleep Apnea, Coronary Artery Disease, Weeks Of Treatment: 3 History: Hypertension, Type II Diabetes, Osteoarthritis, Neuropathy, Clustered Wound: No Confinement Anxiety Photos Wound Measurements Length: (cm) 0.6 Width: (cm) 0.4 Depth: (cm) 0.4 Area: (cm) 0.188 Volume: (cm) 0.075 % Reduction in Area: -49.2% % Reduction in Volume: 14.8% Epithelialization: Small (1-33%) Tunneling: No Undermining: No Wound  Description Classification: Grade 2 Wound Margin: Well defined, not attached Exudate Amount: Medium Exudate Type: Serosanguineous Exudate Color: red, brown Foul Odor After Cleansing: No Slough/Fibrino Yes Wound Bed Granulation Amount: Large (67-100%) Exposed Structure Granulation Quality: Red, Pink Fascia Exposed: No Necrotic Amount: Small (1-33%) Fat Layer (Subcutaneous Tissue) Exposed: Yes Necrotic Quality: Adherent Slough Tendon Exposed: No Muscle Exposed: No Joint Exposed: No Bone Exposed: No Treatment Notes Wound #1 (Toe Great) Wound Laterality: Right Cleanser Peri-Wound Care Topical Primary Dressing KerraCel Ag Gelling Fiber Dressing, 2x2 in (silver alginate) Discharge Instruction: Apply silver alginate to wound bed as instructed Secondary Dressing Woven Gauze Sponges 2x2 in Discharge Instruction: Apply over primary dressing as directed. Optifoam Non-Adhesive Dressing, 4x4 in Discharge Instruction: ***Apply foam donut*** Apply over primary dressing as directed. Secured With Conforming Stretch Gauze Bandage, Sterile 2x75 (in/in) Discharge Instruction: Secure with stretch gauze as directed. Paper Tape, 2x10 (in/yd) Discharge Instruction: Secure dressing with tape as directed. Compression Wrap Compression Stockings Add-Ons Electronic Signature(s) Signed: 12/15/2020 6:10:24 PM By: Deon Pilling Signed: 12/16/2020 9:46:23 AM By: Sandre Kitty Entered By: Sandre Kitty on 12/15/2020 16:02:19 -------------------------------------------------------------------------------- Vitals Details Patient Name: Date of Service: [name] 12/15/2020 8:00 A M Medical Record Number: [id number] Patient Account Number: [id number] Date of Birth/Sex: Treating RN: 11-06-1970 (50 y.o. [name], [name] Primary Care Madlynn Lundeen: [name] Other Clinician: Referring Kyrie Bun: Treating Mardee Clune/Extender: [name] in Treatment:  3 Vital Signs Time Taken: 08:26 Temperature (F): 97.7 Height (in): 69 Pulse (bpm): 84 Weight (lbs): 250 Respiratory Rate (breaths/min): 16 Body Mass Index (BMI): 36.9 Blood Pressure (mmHg): 189/82 Capillary Blood Glucose (mg/dl): 102 Reference Range: 80 - 120 mg / dl Electronic Signature(s) Signed: 12/15/2020 8:51:56 AM By: Sandre Kitty Entered By: Sandre Kitty on 12/15/2020 08:27:11

## 2020-12-22 ENCOUNTER — Telehealth: Payer: Medicaid Other | Admitting: Obstetrics

## 2020-12-22 ENCOUNTER — Encounter (HOSPITAL_BASED_OUTPATIENT_CLINIC_OR_DEPARTMENT_OTHER): Payer: Medicaid Other | Admitting: Internal Medicine

## 2020-12-22 ENCOUNTER — Other Ambulatory Visit: Payer: Self-pay

## 2020-12-22 DIAGNOSIS — L97519 Non-pressure chronic ulcer of other part of right foot with unspecified severity: Secondary | ICD-10-CM | POA: Diagnosis not present

## 2020-12-22 DIAGNOSIS — E11621 Type 2 diabetes mellitus with foot ulcer: Secondary | ICD-10-CM | POA: Diagnosis not present

## 2020-12-22 NOTE — Progress Notes (Signed)
[name], [name] ([id number]) Visit Report for 12/22/2020 Chief Complaint Document Details Patient Name: Date of Service: [name] 12/22/2020 9:00 A M Medical Record Number: [id number] Patient Account Number: [id number] Date of Birth/Sex: Treating RN: 01-14-1971 (50 y.o. [name], [name] Primary Care Provider: [name] Other Clinician: Referring Provider: Treating Provider/Extender: [name] in Treatment: 4 Information Obtained from: Patient Chief Complaint Right foot wound Electronic Signature(s) Signed: 12/22/2020 10:07:48 AM By: Kalman Shan DO Entered By: Kalman Shan on 12/22/2020 10:00:41 -------------------------------------------------------------------------------- Debridement Details Patient Name: Date of Service: [name] 12/22/2020 9:00 A M Medical Record Number: [id number] Patient Account Number: [id number] Date of Birth/Sex: Treating RN: February 15, 1971 (50 y.o. [name], [name] Primary Care Provider: [name] Other Clinician: Referring Provider: Treating Provider/Extender: [name] in Treatment: 4 Debridement Performed for Assessment: Wound #1 Right T Great oe Performed By: Physician Kalman Shan, DO Debridement Type: Debridement Severity of Tissue Pre Debridement: Fat layer exposed Level of Consciousness (Pre-procedure): Awake and Alert Pre-procedure Verification/Time Out Yes - 09:40 Taken: Start Time: 09:40 Pain Control: Lidocaine T Area Debrided (L x W): otal 1 (cm) x 0.9 (cm) = 0.9 (cm) Tissue and other material debrided: Viable, Non-Viable, Callus, Subcutaneous, Skin: Dermis , Skin: Epidermis Level: Skin/Subcutaneous Tissue Debridement Description: Excisional Instrument: Curette Bleeding: Minimum Hemostasis Achieved: Pressure End Time: 09:40 Procedural Pain: 0 Post Procedural Pain: 0 Response to Treatment: Procedure was  tolerated well Level of Consciousness (Post- Awake and Alert procedure): Post Debridement Measurements of Total Wound Length: (cm) 1 Width: (cm) 0.9 Depth: (cm) 0.2 Volume: (cm) 0.141 Character of Wound/Ulcer Post Debridement: Improved Severity of Tissue Post Debridement: Fat layer exposed Post Procedure Diagnosis Same as Pre-procedure Electronic Signature(s) Signed: 12/22/2020 10:07:48 AM By: Kalman Shan DO Signed: 12/22/2020 5:09:03 PM By: Rhae Hammock RN Entered By: Rhae Hammock on 12/22/2020 09:41:20 -------------------------------------------------------------------------------- HPI Details Patient Name: Date of Service: [name] 12/22/2020 9:00 A M Medical Record Number: [id number] Patient Account Number: [id number] Date of Birth/Sex: Treating RN: 01/25/1971 (50 y.o. [name], [name] Primary Care Provider: [name] Other Clinician: Referring Provider: Treating Provider/Extender: [name] in Treatment: 4 History of Present Illness HPI Description: [name] is a 50 year old female with past medical history of insulin-dependent type 2 diabetes, previous left pontine CVA, essential hypertension that presents to the clinic today for evaluation of her right foot wound. She states that in December 2021 she had cut open her right great toe on a door step . It became infected and she was treated with antibiotics. She started following with podiatry in February and has been using Betadine daily on the wound. She is also using a surgical shoe to help with offloading. She has diabetic neuropathy and does not report pain to the wound. She denies any purulent drainage, fever/chills or increased warmth or erythema to the foot. 4/21; patient has been using silver alginate every other day with dressing changes. She denies any purulent drainage, fever/chills or increased warmth or erythema to the foot. She does  however notice an odor. 4/26; patient presents for 1 week follow-up. She continues to use silver alginate every other day without any issues. She denies purulent drainage, fever/chills or increased warmth or erythema to the foot. She does not notice an odor anymore. She has no complaints today. 5/3; patient presents for 1 week follow-up. She states that she has an MRI scheduled for Friday. She has been  using silver alginate every other day to the wound. She has noted more maceration. She denies any pain or acute signs of infection. 5/10; patient presents for 1 week follow-up. Her MRI had to be rescheduled due to needing a prior authorization. It is scheduled for 5/19. Patient has been using Hydrofera Blue every other day. She denies acute signs of infection. She has no complaints today. 5/17; patient presents for 1 week follow-up. She has an MRI scheduled on 5/19. She has been using silver alginate every other day to the wound. She states that after she took a shower she was able to remove part of the hardened callus to the tip of her toe. She denies signs of infection. Electronic Signature(s) Signed: 12/22/2020 10:07:48 AM By: Kalman Shan DO Entered By: Kalman Shan on 12/22/2020 10:02:27 -------------------------------------------------------------------------------- Physical Exam Details Patient Name: Date of Service: [name] 12/22/2020 9:00 A M Medical Record Number: [id number] Patient Account Number: [id number] Date of Birth/Sex: Treating RN: [birth year] (50 y.o. [name], [name] Primary Care Provider: [name] Other Clinician: Referring Provider: Treating Provider/Extender: [name] in Treatment: 4 Constitutional respirations regular, non-labored and within target range for patient.. Cardiovascular 2+ dorsalis pedis/posterior tibialis pulses. Psychiatric pleasant and cooperative. Notes Right great toe: Wound on the  plantar aspect. This callus present and this was debrided however significant improvement from last clinic visit. There is some undermining at the 6 o'clock position. Granulation tissue present with no signs of infection. Electronic Signature(s) Signed: 12/22/2020 10:07:48 AM By: Kalman Shan DO Entered By: Kalman Shan on 12/22/2020 10:03:38 -------------------------------------------------------------------------------- Physician Orders Details Patient Name: Date of Service: [name] 12/22/2020 9:00 A M Medical Record Number: [id number] Patient Account Number: [id number] Date of Birth/Sex: Treating RN: 05/18/1971 (50 y.o. 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 Bathing/ Shower/ Hygiene May shower and wash wound with soap and water. - on days when dressing is changed. Edema Control - Lymphedema / SCD / Other Elevate legs to the level of the heart or above for 30 minutes daily and/or when sitting, a frequency of: - throughout the day. Avoid standing for long periods of time. Off-Loading Wedge shoe to: - ***today in clinic please provide patient with a front off loader.*** Patient to wear while standing and walking. Minimize standing and walking on the foot/toe.*** Wound Treatment Wound #1 - T Great oe Wound Laterality: Right Prim Dressing: KerraCel Ag Gelling Fiber Dressing, 2x2 in (silver alginate) Every Other Day/15 Days ary Discharge Instructions: Apply silver alginate to wound  bed as instructed Secondary Dressing: Woven Gauze Sponges 2x2 in (Generic) Every Other Day/15 Days Discharge Instructions: Apply over primary dressing as directed. Secondary Dressing: Optifoam Non-Adhesive Dressing, 4x4 in Every Other Day/15 Days Discharge Instructions: ***Apply foam donut*** Apply over primary dressing as directed. Secured With: Child psychotherapist, Sterile 2x75 (in/in) (Generic) Every Other Day/15 Days Discharge Instructions: Secure with stretch gauze as directed. Secured With: Paper Tape, 2x10 (in/yd) (Generic) Every Other Day/15 Days Discharge Instructions: Secure dressing with tape as directed. Electronic Signature(s) Signed: 12/22/2020 10:07:48 AM By: Kalman Shan DO Entered By: Kalman Shan on  12/22/2020 10:04:04 -------------------------------------------------------------------------------- Problem List Details Patient Name: Date of Service: [name] 12/22/2020 9:00 A M Medical Record Number: [id number] Patient Account Number: [id number] Date of Birth/Sex: Treating RN: September 13, 1970 (50 y.o. 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 12/22/2020 9:00 A M Medical Record Number: [id number] Patient Account Number: [id number] Date of Birth/Sex: Treating RN: 05-18-1971 (50 y.o. [name], [name] Primary Care Provider: [name] Other Clinician: Referring Provider: Treating Provider/Extender: [name] in Treatment: 4 Subjective Chief Complaint Information obtained from Patient Right foot wound History of Present Illness (HPI) Ms. Niesa Alejandrez is a 50 year old female with past medical history of insulin-dependent type 2 diabetes, previous left pontine CVA, essential hypertension that presents to the clinic today for evaluation of her right foot wound. She states that in December 2021 she had cut open her right great toe on a door step . It became infected and she was treated with antibiotics. She started following with podiatry in February and has been using Betadine daily on the wound. She is also using a surgical shoe to help with offloading. She has diabetic neuropathy and does not report pain to the wound. She denies any purulent drainage, fever/chills or increased warmth or erythema to the foot. 4/21; patient has been using silver alginate every other day with dressing changes. She denies any purulent drainage, fever/chills or increased warmth or erythema to the foot. She does however notice an odor. 4/26; patient presents for 1 week follow-up. She continues to use silver alginate every other day without any issues. She denies purulent drainage, fever/chills or increased warmth or erythema to the foot. She does not notice an odor anymore. She has no complaints today. 5/3; patient presents for 1 week follow-up. She states that she has an MRI scheduled for Friday. She has been using silver alginate every other day to the wound. She has noted more maceration. She denies any pain or acute signs of infection. 5/10; patient presents for 1 week follow-up.  Her MRI had to be rescheduled due to needing a prior authorization. It is scheduled for 5/19. Patient has been using Hydrofera Blue every other day. She denies acute signs of infection. She has no complaints today. 5/17; patient presents for 1 week follow-up. She has an MRI scheduled on 5/19. She has been using silver alginate every other day to the wound. She states that after she took a shower she was able to remove part of the hardened callus to the tip of her toe. She denies signs of infection. Patient History Information obtained from Patient. Family History Cancer - Maternal Grandparents, Diabetes - Mother,Father,Maternal Grandparents,Paternal Grandparents, Heart Disease - Mother,Father,Maternal Grandparents,Paternal Grandparents,Siblings, Hypertension - Mother,Father, Kidney Disease - Maternal Grandparents, Stroke - Maternal Grandparents,Mother, No family history of Hereditary Spherocytosis, Lung Disease, Seizures, Thyroid Problems, Tuberculosis. Social History Former smoker - quit 3-4 yrs ago, Marital Status - Single,  Alcohol Use - Rarely, Drug Use - Current History - marijuana, Caffeine Use - Rarely. Medical History Eyes Denies history of Cataracts, Glaucoma, Optic Neuritis Hematologic/Lymphatic Patient has history of Anemia Respiratory Patient has history of Asthma, Sleep Apnea - CPAP Cardiovascular Patient has history of Coronary Artery Disease, Hypertension Endocrine Patient has history of Type II Diabetes Denies history of Type I Diabetes Genitourinary Denies history of End Stage Renal Disease Integumentary (Skin) Denies history of History of Burn Musculoskeletal Patient has history of Osteoarthritis Neurologic Patient has history of Neuropathy Oncologic Denies history of Received Chemotherapy, Received Radiation Psychiatric Patient has history of Confinement Anxiety Denies history of Anorexia/bulimia Hospitalization/Surgery History - achilles tendon rupture  right. Medical A Surgical History Notes nd Constitutional Symptoms (General Health) vitamin D deficiency Eyes hypertensive retinopathy Cardiovascular hyperlipidemia Endocrine left thyroid nodule Genitourinary CKD Neurologic stroke Objective Constitutional respirations regular, non-labored and within target range for patient.. Vitals Time Taken: 8:56 AM, Height: 69 in, Weight: 250 lbs, BMI: 36.9, Temperature: 98 F, Pulse: 86 bpm, Respiratory Rate: 18 breaths/min, Blood Pressure: 197/105 mmHg, Capillary Blood Glucose: 99 mg/dl. General Notes: glucose per pt report this am Cardiovascular 2+ dorsalis pedis/posterior tibialis pulses. Psychiatric pleasant and cooperative. General Notes: Right great toe: Wound on the plantar aspect. This callus present and this was debrided however significant improvement from last clinic visit. There is some undermining at the 6 o'clock position. Granulation tissue present with no signs of infection. Integumentary (Hair, Skin) Wound #1 status is Open. Original cause of wound was Puncture. The date acquired was: 07/09/2020. The wound has been in treatment 4 weeks. The wound is located on the Right T Great. The wound measures 1cm length x 0.9cm width x 0.2cm depth; 0.707cm^2 area and 0.141cm^3 volume. There is Fat Layer oe (Subcutaneous Tissue) exposed. There is no tunneling or undermining noted. There is a medium amount of serosanguineous drainage noted. The wound margin is thickened. There is large (67-100%) red granulation within the wound bed. There is a small (1-33%) amount of necrotic tissue within the wound bed including Adherent Slough. 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  There is still callus and nonviable tissue present and this was debrided today. No signs of infection. There is some granulation tissue present. We will continue with silver alginate every other day. Asked the patient not to dress the wound prior to going to get her MRI but to do so afterwards. If MRI shows no signs of osteo will start with total contact cast Procedures Wound #1 Pre-procedure diagnosis of Wound #1 is a Diabetic Wound/Ulcer of the Lower Extremity located on the Right T Great .Severity of Tissue Pre Debridement is: oe Fat layer exposed. There was a Excisional Skin/Subcutaneous Tissue Debridement with a total area of 0.9 sq cm performed by Kalman Shan, DO. With the following instrument(s): Curette to remove Viable and Non-Viable tissue/material. Material removed includes Callus, Subcutaneous Tissue, Skin: Dermis, and Skin: Epidermis after achieving pain control using Lidocaine. No specimens were taken. A time out was conducted at 09:40, prior to the start of the procedure. A Minimum amount of bleeding was controlled with Pressure. The procedure was tolerated well with a pain level of 0 throughout and a pain level of 0 following the procedure. Post Debridement Measurements: 1cm length x 0.9cm width x 0.2cm depth;  0.141cm^3 volume. Character of Wound/Ulcer Post Debridement is improved. Severity of Tissue Post Debridement is: Fat layer exposed. Post procedure Diagnosis Wound #1: Same as Pre-Procedure Plan Follow-up Appointments: Return Appointment in 1 week. Bathing/ Shower/ Hygiene: May shower and wash wound with soap and water. - on days when dressing is changed. Edema Control - Lymphedema / SCD / Other: Elevate legs to the level of the heart or above for 30 minutes daily and/or when sitting, a frequency of: - throughout the day. Avoid standing for long periods of time. Off-Loading: Wedge shoe to: - ***today in clinic please provide patient with a front off loader.*** Patient  to wear while standing and walking. Minimize standing and walking on the foot/toe.*** WOUND #1: - T Great Wound Laterality: Right oe Prim Dressing: KerraCel Ag Gelling Fiber Dressing, 2x2 in (silver alginate) Every Other Day/15 Days ary Discharge Instructions: Apply silver alginate to wound bed as instructed Secondary Dressing: Woven Gauze Sponges 2x2 in (Generic) Every Other Day/15 Days Discharge Instructions: Apply over primary dressing as directed. Secondary Dressing: Optifoam Non-Adhesive Dressing, 4x4 in Every Other Day/15 Days Discharge Instructions: ***Apply foam donut*** Apply over primary dressing as directed. Secured With: Child psychotherapist, Sterile 2x75 (in/in) (Generic) Every Other Day/15 Days Discharge Instructions: Secure with stretch gauze as directed. Secured With: Paper T ape, 2x10 (in/yd) (Generic) Every Other Day/15 Days Discharge Instructions: Secure dressing with tape as directed. 1. Silver alginate 2. In office sharp debridement 3. MRI of the right foot 4. Follow-up in 1 week Electronic Signature(s) Signed: 12/22/2020 10:07:48 AM By: Kalman Shan DO Entered By: Kalman Shan on 12/22/2020 10:07:21 -------------------------------------------------------------------------------- HxROS Details Patient Name: Date of Service: [name] 12/22/2020 9:00 A M Medical Record Number: [id number] Patient Account Number: [id number] Date of Birth/Sex: Treating RN: 12-20-1970 (50 y.o. [name], [name] Primary Care Provider: [name] Other Clinician: Referring Provider: Treating Provider/Extender: [name] in Treatment: 4 Information Obtained From Patient Constitutional Symptoms (General Health) Medical History: Past Medical History Notes: vitamin D deficiency Eyes Medical History: Negative for: Cataracts; Glaucoma; Optic Neuritis Past Medical History Notes: hypertensive  retinopathy Hematologic/Lymphatic Medical History: Positive for: Anemia Respiratory Medical History: Positive for: Asthma; Sleep Apnea - CPAP Cardiovascular Medical History: Positive for: Coronary Artery Disease; Hypertension Past Medical History Notes: hyperlipidemia Endocrine Medical History: Positive for: Type II Diabetes Negative for: Type I Diabetes Past Medical History Notes: left thyroid nodule Time with diabetes: since age 86 Blood sugar tested every day: Yes Tested : 2-3 times per day Genitourinary Medical History: Negative for: End Stage Renal Disease Past Medical History Notes: CKD Integumentary (Skin) Medical History: Negative for: History of Burn Musculoskeletal Medical History: Positive for: Osteoarthritis Neurologic Medical History: Positive for: Neuropathy Past Medical History Notes: stroke Oncologic Medical History: Negative for: Received Chemotherapy; Received Radiation Psychiatric Medical History: Positive for: Confinement Anxiety Negative for: Anorexia/bulimia Immunizations Pneumococcal Vaccine: Received Pneumococcal Vaccination: No Implantable Devices No devices added Hospitalization / Surgery History Type of Hospitalization/Surgery achilles tendon rupture right Family and Social History Cancer: Yes - Maternal Grandparents; Diabetes: Yes - Mother,Father,Maternal Grandparents,Paternal Grandparents; Heart Disease: Yes - Mother,Father,Maternal Grandparents,Paternal Grandparents,Siblings; Hereditary Spherocytosis: No; Hypertension: Yes - Mother,Father; Kidney Disease: Yes - Maternal Grandparents; Lung Disease: No; Seizures: No; Stroke: Yes - Maternal Grandparents,Mother; Thyroid Problems: No; Tuberculosis: No; Former smoker - quit 3-4 yrs ago; Marital Status - Single; Alcohol Use: Rarely; Drug Use: Current History - marijuana; Caffeine Use: Rarely; Financial Concerns: No; Food, Clothing or Shelter Needs: No; Support System  Lacking: No;  Transportation Concerns: No Electronic Signature(s) Signed: 12/22/2020 10:07:48 AM By: Kalman Shan DO Signed: 12/22/2020 5:09:03 PM By: Rhae Hammock RN Entered By: Kalman Shan on 12/22/2020 10:02:37 -------------------------------------------------------------------------------- SuperBill Details Patient Name: Date of Service: [name] 12/22/2020 Medical Record Number: [id number] Patient Account Number: [id number] Date of Birth/Sex: Treating RN: 1970-09-03 (50 y.o. 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 I63.9 Cerebral infarction, unspecified I10 Essential (primary) hypertension Facility Procedures CPT4 Code: [id number] Description: [id number] - DEB SUBQ TISSUE 20 SQ CM/< ICD-10 Diagnosis Description L97.519 Non-pressure chronic ulcer of other part of right foot with unspecified severi Modifier: ty Quantity: 1 Physician Procedures : CPT4 Code Description Modifier [id number] - WC PHYS SUBQ TISS 20 SQ CM ICD-10 Diagnosis Description L97.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

## 2020-12-23 NOTE — Progress Notes (Signed)
[name], [name] ([id number]) Visit Report for 12/22/2020 Arrival Information Details Patient Name: Date of Service: Teresa Teresa Norma NDA E. 12/22/2020 9:00 A M Medical Record Number: [id number] Patient Account Number: [id number] Date of Birth/Sex: Treating RN: Dec 11, 1970 (50 y.o. [name] Primary Care Jovaun Levene: [name] Other Clinician: Referring [place] Latulippe: Treating Kaiulani Sitton/Extender: [name] in Treatment: 4 Visit Information History Since Last Visit Added or deleted any medications: No Patient Arrived: Ambulatory Any new allergies or adverse reactions: No Arrival Time: 08:52 Had a fall or experienced change in No Accompanied By: self activities of daily living that may affect Transfer Assistance: None risk of falls: Patient Identification Verified: Yes Signs or symptoms of abuse/neglect since No Secondary Verification Process Completed: Yes last visito Patient Requires Transmission-Based Precautions: No Hospitalized since last visit: No Patient Has Alerts: No Implantable device outside of the clinic No excluding cellular tissue based products placed in the center since last visit: Has Dressing in Place as Prescribed: Yes Has Footwear/Offloading in Place as Yes Prescribed: Right: Surgical Shoe with Pressure Relief Insole Pain Present Now: No Electronic Signature(s) Signed: 12/22/2020 5:35:12 PM By: Baruch Gouty RN, BSN Entered By: Baruch Gouty on 12/22/2020 08:56:02 -------------------------------------------------------------------------------- Encounter Discharge Information Details Patient Name: Date of Service: Teresa Teresa Norma NDA E. 12/22/2020 9:00 A M Medical Record Number: [id number] Patient Account Number: [id number] Date of Birth/Sex: Treating RN: 15-Dec-1970 (50 y.o. [name] Primary Care [place] Nordquist: [name] Other Clinician: Referring Quamesha Mullet: Treating Orma Cheetham/Extender: [name] in Treatment: 4 Encounter Discharge Information Items Post Procedure Vitals Discharge Condition: Stable Temperature (F): 98 Ambulatory Status: Ambulatory Pulse (bpm): 86 Discharge Destination: Home Respiratory Rate (breaths/min): 18 Transportation: Private Auto Blood Pressure (mmHg): 180/98 Schedule Follow-up Appointment: Yes Clinical Summary of Care: Provided on 12/22/2020 Form Type Recipient Paper Patient Patient Electronic Signature(s) Signed: 12/22/2020 10:03:22 AM By: Lorrin Jackson Entered By: Lorrin Jackson on 12/22/2020 10:03:21 -------------------------------------------------------------------------------- Lower Extremity Assessment Details Patient Name: Date of Service: Teresa Teresa Norma NDA E. 12/22/2020 9:00 A M Medical Record Number: [id number] Patient Account Number: [id number] Date of Birth/Sex: Treating RN: March 08, 1971 (50 y.o. [name] Primary Care Nasya Vincent: [name] Other Clinician: Referring Charlene Detter: Treating Thersia Petraglia/Extender: [name] in Treatment: 4 Edema Assessment Assessed: [Left: No] [Right: No] Edema: [Left: N] [Right: o] Calf Left: Right: Point of Measurement: From Medial Instep 36.4 cm Ankle Left: Right: Point of Measurement: From Medial Instep 23.2 cm Vascular Assessment Pulses: Dorsalis Pedis Palpable: [Right:Yes] Electronic Signature(s) Signed: 12/22/2020 5:35:12 PM By: Baruch Gouty RN, BSN Entered By: Baruch Gouty on 12/22/2020 09:04:38 -------------------------------------------------------------------------------- Multi Wound Chart Details Patient Name: Date of Service: Teresa Teresa Norma NDA E. 12/22/2020 9:00 A M Medical Record Number: [id number] Patient Account Number: [id number] Date of Birth/Sex: Treating RN: 1971/05/30 (50 y.o. [name], [name] Primary Care Amine Adelson: [name] Other Clinician: Referring Pistol Kessenich: Treating  Arloa Prak/Extender: [name] in Treatment: 4 Vital Signs Height(in): 69 Capillary Blood Glucose(mg/dl): 99 Weight(lbs): 250 Pulse(bpm): 35 Body Mass Index(BMI): 37 Blood Pressure(mmHg): 197/105 Temperature(F): 98 Respiratory Rate(breaths/min): 18 Photos: [1:No Photos Right T Great oe] [N/A:N/A N/A] Wound Location: [1:Puncture] [N/A:N/A] Wounding Event: [1:Diabetic Wound/Ulcer of the Lower] [N/A:N/A] Primary Etiology: [1:Extremity Anemia, Asthma, Sleep Apnea,] [N/A:N/A] Comorbid History: [1:Coronary Artery Disease, Hypertension, Type II Diabetes, Osteoarthritis, Neuropathy, Confinement Anxiety 07/09/2020] [N/A:N/A] Date Acquired: [1:4] [N/A:N/A] Weeks of Treatment: [1:Open] [N/A:N/A] Wound Status: [1:1x0.9x0.2] [N/A:N/A] Measurements L x W x D (cm) [1:0.707] [N/A:N/A] A (cm) : rea [1:0.141] [N/A:N/A]  Volume (cm) : [1:-461.10%] [N/A:N/A] % Reduction in A [1:rea: -60.20%] [N/A:N/A] % Reduction in Volume: [1:Grade 2] [N/A:N/A] Classification: [1:Medium] [N/A:N/A] Exudate A mount: [1:Serosanguineous] [N/A:N/A] Exudate Type: [1:red, brown] [N/A:N/A] Exudate Color: [1:Thickened] [N/A:N/A] Wound Margin: [1:Large (67-100%)] [N/A:N/A] Granulation A mount: [1:Red] [N/A:N/A] Granulation Quality: [1:Small (1-33%)] [N/A:N/A] Necrotic A mount: [1:Fat Layer (Subcutaneous Tissue): Yes N/A] Exposed Structures: [1:Fascia: No Tendon: No Muscle: No Joint: No Bone: No Small (1-33%)] [N/A:N/A] Epithelialization: [1:Debridement - Excisional] [N/A:N/A] Debridement: Pre-procedure Verification/Time Out 09:40 [N/A:N/A] Taken: [1:Lidocaine] [N/A:N/A] Pain Control: [1:Callus, Subcutaneous] [N/A:N/A] Tissue Debrided: [1:Skin/Subcutaneous Tissue] [N/A:N/A] Level: [1:0.9] [N/A:N/A] Debridement A (sq cm): [1:rea Curette] [N/A:N/A] Instrument: [1:Minimum] [N/A:N/A] Bleeding: [1:Pressure] [N/A:N/A] Hemostasis A chieved: [1:0] [N/A:N/A] Procedural Pain: [1:0]  [N/A:N/A] Post Procedural Pain: [1:Procedure was tolerated well] [N/A:N/A] Debridement Treatment Response: [1:1x0.9x0.2] [N/A:N/A] Post Debridement Measurements L x W x D (cm) [1:0.141] [N/A:N/A] Post Debridement Volume: (cm) [1:Debridement] [N/A:N/A] Treatment Notes Electronic Signature(s) Signed: 12/22/2020 10:07:48 AM By: Kalman Shan DO Signed: 12/22/2020 5:09:03 PM By: Rhae Hammock RN Entered By: Kalman Shan on 12/22/2020 10:00:30 -------------------------------------------------------------------------------- Multi-Disciplinary Care Plan Details Patient Name: Date of Service: Teresa Teresa Norma NDA E. 12/22/2020 9:00 A M Medical Record Number: [id number] Patient Account Number: [id number] Date of Birth/Sex: Treating RN: 1971/05/15 (50 y.o. [name], [name] Primary Care Rakeya Glab: [name] Other Clinician: Referring Miriana Gaertner: Treating Koven Belinsky/Extender: [name] in Treatment: 4 Multidisciplinary Care Plan reviewed with physician Active Inactive Nutrition Nursing Diagnoses: Impaired glucose control: actual or potential Potential for alteratiion in Nutrition/Potential for imbalanced nutrition Goals: Patient/caregiver will maintain therapeutic glucose control Date Initiated: 11/19/2020 Target Resolution Date: 01/01/2021 Goal Status: Active Interventions: Assess patient nutrition upon admission and as needed per policy Provide education on elevated blood sugars and impact on wound healing Provide education on nutrition Treatment Activities: Patient referred to Primary Care Physician for further nutritional evaluation : 11/19/2020 Notes: Wound/Skin Impairment Nursing Diagnoses: Impaired tissue integrity Knowledge deficit related to ulceration/compromised skin integrity Goals: Patient/caregiver will verbalize understanding of skin care regimen Date Initiated: 11/19/2020 Date Inactivated: 12/08/2020 Target Resolution Date:  12/17/2020 Goal Status: Met Ulcer/skin breakdown will have a volume reduction of 30% by week 4 Date Initiated: 11/19/2020 Target Resolution Date: 01/01/2021 Goal Status: Active Interventions: Assess patient/caregiver ability to obtain necessary supplies Assess patient/caregiver ability to perform ulcer/skin care regimen upon admission and as needed Assess ulceration(s) every visit Provide education on ulcer and skin care Treatment Activities: Skin care regimen initiated : 11/19/2020 Topical wound management initiated : 11/19/2020 Notes: Electronic Signature(s) Signed: 12/22/2020 5:09:03 PM By: Rhae Hammock RN Entered By: Rhae Hammock on 12/22/2020 09:41:55 -------------------------------------------------------------------------------- Pain Assessment Details Patient Name: Date of Service: Teresa Teresa Norma NDA E. 12/22/2020 9:00 A M Medical Record Number: [id number] Patient Account Number: [id number] Date of Birth/Sex: Treating RN: 1970-08-30 (50 y.o. [name] Primary Care Carlous Olivares: [name] Other Clinician: Referring Deryl Ports: Treating Jaylea Plourde/Extender: [name] in Treatment: 4 Active Problems Location of Pain Severity and Description of Pain Patient Has Paino No Site Locations Rate the pain. Rate the pain. Current Pain Level: 0 Pain Management and Medication Current Pain Management: Electronic Signature(s) Signed: 12/22/2020 5:35:12 PM By: Baruch Gouty RN, BSN Entered By: Baruch Gouty on 12/22/2020 09:00:58 -------------------------------------------------------------------------------- Patient/Caregiver Education Details Patient Name: Date of Service: Teresa Teresa Norma NDA E. 5/17/2022andnbsp9:00 A M Medical Record Number: [id number] Patient Account Number: [id number] Date of Birth/Gender: Treating RN: [birth year] (50 y.o. Benjaman Lobe Primary Care Physician: [name] Other  Clinician: Referring Physician: Treating Physician/Extender:  Kalman Shan Steele Sizer in Treatment: 4 Education Assessment Education Provided To: Patient Education Topics Provided Elevated Blood Sugar/ Impact on Healing: Nutrition: Wound/Skin Impairment: Engineer, maintenance) Signed: 12/22/2020 5:09:03 PM By: Rhae Hammock RN Entered By: Rhae Hammock on 12/22/2020 09:42:12 -------------------------------------------------------------------------------- Wound Assessment Details Patient Name: Date of Service: Teresa Teresa Norma NDA E. 12/22/2020 9:00 A M Medical Record Number: [id number] Patient Account Number: [id number] Date of Birth/Sex: Treating RN: 06-11-1971 (50 y.o. [name] Primary Care Veretta Sabourin: [name] Other Clinician: Referring Jourdin Gens: Treating Maciej Schweitzer/Extender: [name] in Treatment: 4 Wound Status Wound Number: 1 Primary Diabetic Wound/Ulcer of the Lower Extremity Etiology: Wound Location: Right T Great oe Wound Open Wounding Event: Puncture Status: Date Acquired: 07/09/2020 Comorbid Anemia, Asthma, Sleep Apnea, Coronary Artery Disease, Weeks Of Treatment: 4 History: Hypertension, Type II Diabetes, Osteoarthritis, Neuropathy, Clustered Wound: No Confinement Anxiety Photos Wound Measurements Length: (cm) 1 Width: (cm) 0.9 Depth: (cm) 0.2 Area: (cm) 0.707 Volume: (cm) 0.141 % Reduction in Area: -461.1% % Reduction in Volume: -60.2% Epithelialization: Small (1-33%) Tunneling: No Undermining: No Wound Description Classification: Grade 2 Wound Margin: Thickened Exudate Amount: Medium Exudate Type: Serosanguineous Exudate Color: red, brown Foul Odor After Cleansing: No Slough/Fibrino Yes Wound Bed Granulation Amount: Large (67-100%) Exposed Structure Granulation Quality: Red Fascia Exposed: No Necrotic Amount: Small (1-33%) Fat Layer (Subcutaneous Tissue) Exposed:  Yes Necrotic Quality: Adherent Slough Tendon Exposed: No Muscle Exposed: No Joint Exposed: No Bone Exposed: No Treatment Notes Wound #1 (Toe Great) Wound Laterality: Right Cleanser Peri-Wound Care Topical Primary Dressing KerraCel Ag Gelling Fiber Dressing, 2x2 in (silver alginate) Discharge Instruction: Apply silver alginate to wound bed as instructed Secondary Dressing Woven Gauze Sponges 2x2 in Discharge Instruction: Apply over primary dressing as directed. Optifoam Non-Adhesive Dressing, 4x4 in Discharge Instruction: ***Apply foam donut*** Apply over primary dressing as directed. Secured With Conforming Stretch Gauze Bandage, Sterile 2x75 (in/in) Discharge Instruction: Secure with stretch gauze as directed. Paper Tape, 2x10 (in/yd) Discharge Instruction: Secure dressing with tape as directed. Compression Wrap Compression Stockings Add-Ons Electronic Signature(s) Signed: 12/22/2020 5:35:12 PM By: Baruch Gouty RN, BSN Signed: 12/23/2020 11:07:31 AM By: Sandre Kitty Entered By: Sandre Kitty on 12/22/2020 16:42:36 -------------------------------------------------------------------------------- Vitals Details Patient Name: Date of Service: Teresa Teresa Norma NDA E. 12/22/2020 9:00 A M Medical Record Number: [id number] Patient Account Number: [id number] Date of Birth/Sex: Treating RN: 08-15-1970 (50 y.o. [name] Primary Care Emmanuela Ghazi: [name] Other Clinician: Referring Copelyn Widmer: Treating Akaysha Cobern/Extender: [name] in Treatment: 4 Vital Signs Time Taken: 08:56 Temperature (F): 98 Height (in): 69 Pulse (bpm): 86 Weight (lbs): 250 Respiratory Rate (breaths/min): 18 Body Mass Index (BMI): 36.9 Blood Pressure (mmHg): 197/105 Capillary Blood Glucose (mg/dl): 99 Reference Range: 80 - 120 mg / dl Notes glucose per pt report this am Electronic Signature(s) Signed: 12/22/2020 5:35:12 PM By: Baruch Gouty RN,  BSN Entered By: Baruch Gouty on 12/22/2020 09:00:46

## 2020-12-23 NOTE — Progress Notes (Signed)
Appointment cancelled

## 2020-12-24 ENCOUNTER — Ambulatory Visit (HOSPITAL_COMMUNITY)
Admission: RE | Admit: 2020-12-24 | Discharge: 2020-12-24 | Disposition: A | Discharge status: Home / Self Care | Payer: Medicaid Other | Source: Ambulatory Visit | Attending: Internal Medicine | Admitting: Internal Medicine

## 2020-12-24 ENCOUNTER — Other Ambulatory Visit: Payer: Self-pay

## 2020-12-24 DIAGNOSIS — L97408 Non-pressure chronic ulcer of unspecified heel and midfoot with other specified severity: Secondary | ICD-10-CM | POA: Insufficient documentation

## 2020-12-24 DIAGNOSIS — E08621 Diabetes mellitus due to underlying condition with foot ulcer: Secondary | ICD-10-CM | POA: Diagnosis not present

## 2020-12-24 MED ORDER — GADOBUTROL 1 MMOL/ML IV SOLN
10.0000 mL | Freq: Once | INTRAVENOUS | Status: AC | PRN
  Administered 2020-12-24: 10 mL via INTRAVENOUS

## 2020-12-28 ENCOUNTER — Ambulatory Visit
Admission: RE | Admit: 2020-12-28 | Discharge: 2020-12-28 | Disposition: A | Discharge status: Home / Self Care | Payer: Medicaid Other | Source: Ambulatory Visit | Attending: Obstetrics | Admitting: Obstetrics

## 2020-12-28 ENCOUNTER — Other Ambulatory Visit: Payer: Self-pay

## 2020-12-28 DIAGNOSIS — N939 Abnormal uterine and vaginal bleeding, unspecified: Secondary | ICD-10-CM | POA: Diagnosis present

## 2020-12-29 ENCOUNTER — Encounter (HOSPITAL_BASED_OUTPATIENT_CLINIC_OR_DEPARTMENT_OTHER): Payer: Medicaid Other | Admitting: Internal Medicine

## 2020-12-29 DIAGNOSIS — L97519 Non-pressure chronic ulcer of other part of right foot with unspecified severity: Secondary | ICD-10-CM

## 2020-12-29 DIAGNOSIS — E11621 Type 2 diabetes mellitus with foot ulcer: Secondary | ICD-10-CM | POA: Diagnosis not present

## 2020-12-30 NOTE — Progress Notes (Signed)
[name], [name] ([id number]) Visit Report for 12/29/2020 Arrival Information Details Patient Name: Date of Service: [name] 12/29/2020 8:00 A M Medical Record Number: [id number] Patient Account Number: [id number] Date of Birth/Sex: Treating RN: 30-Oct-1970 (50 y.o. [name], [name] Primary Care Takesha Steger: [name] Other Clinician: Referring Avram Danielson: Treating Barry Faircloth/Extender: [name] in Treatment: 5 Visit Information History Since Last Visit Added or deleted any medications: No Patient Arrived: Ambulatory Any new allergies or adverse reactions: No Arrival Time: 08:01 Had a fall or experienced change in No Accompanied By: self activities of daily living that may affect Transfer Assistance: None risk of falls: Patient Identification Verified: Yes Signs or symptoms of abuse/neglect since last visito No Secondary Verification Process Completed: Yes Hospitalized since last visit: No Patient Requires Transmission-Based Precautions: No Implantable device outside of the clinic excluding No Patient Has Alerts: No cellular tissue based products placed in the center since last visit: Has Dressing in Place as Prescribed: Yes Pain Present Now: No Electronic Signature(s) Signed: 12/29/2020 4:53:55 PM By: Sandre Kitty Entered By: Sandre Kitty on 12/29/2020 08:03:16 -------------------------------------------------------------------------------- Encounter Discharge Information Details Patient Name: Date of Service: [name] 12/29/2020 8:00 A M Medical Record Number: [id number] Patient Account Number: [id number] Date of Birth/Sex: Treating RN: 16-Feb-1971 (50 y.o. [name] Primary Care Aniyah Nobis: [name] Other Clinician: Referring Kerrilyn Azbill: Treating Cache Bills/Extender: [name] in Treatment: 5 Encounter Discharge Information Items Post Procedure  Vitals Discharge Condition: Stable Temperature (F): 98.3 Ambulatory Status: Ambulatory Pulse (bpm): 80 Discharge Destination: Home Respiratory Rate (breaths/min): 18 Transportation: Other Blood Pressure (mmHg): 145/76 Accompanied By: transportation service Schedule Follow-up Appointment: Yes Clinical Summary of Care: Patient Declined Electronic Signature(s) Signed: 12/29/2020 5:08:08 PM By: Baruch Gouty RN, BSN Entered By: Baruch Gouty on 12/29/2020 09:18:39 -------------------------------------------------------------------------------- Lower Extremity Assessment Details Patient Name: Date of Service: [name] 12/29/2020 8:00 A M Medical Record Number: [id number] Patient Account Number: [id number] Date of Birth/Sex: Treating RN: [birth year] (50 y.o. [name], [name] Primary Care Algie Westry: [name] Other Clinician: Referring Sally-Anne Wamble: Treating Meli Faley/Extender: [name] in Treatment: 5 Edema Assessment Assessed: [name]: No] [Right: Yes] Edema: [Left: N] [Right: o] Calf Left: Right: Point of Measurement: From Medial Instep 36.4 cm Ankle Left: Right: Point of Measurement: From Medial Instep 23.2 cm Vascular Assessment Pulses: Dorsalis Pedis Palpable: [Right:Yes] Posterior Tibial Palpable: [Right:Yes] Electronic Signature(s) Signed: 12/30/2020 4:26:30 PM By: Rhae Hammock RN Entered By: Rhae Hammock on 12/29/2020 08:33:18 -------------------------------------------------------------------------------- Multi Wound Chart Details Patient Name: Date of Service: [name] 12/29/2020 8:00 A M Medical Record Number: [id number] Patient Account Number: [id number] Date of Birth/Sex: Treating RN: 1971-01-15 (50 y.o. [name], [name] Primary Care Lajada Janes: [name] Other Clinician: Referring Zaedyn Covin: Treating Lauretta Sallas/Extender: [name] in  Treatment: 5 Vital Signs Height(in): 69 Pulse(bpm): 80 Weight(lbs): 250 Blood Pressure(mmHg): 145/76 Body Mass Index(BMI): 37 Temperature(F): 98.3 Respiratory Rate(breaths/min): 18 Photos: [1:No Photos Right T Great oe] [N/A:N/A N/A] Wound Location: [1:Puncture] [N/A:N/A] Wounding Event: [1:Diabetic Wound/Ulcer of the Lower] [N/A:N/A] Primary Etiology: [1:Extremity 07/09/2020] [N/A:N/A] Date Acquired: [1:5] [N/A:N/A] Weeks of Treatment: [1:Open] [N/A:N/A] Wound Status: [1:0.7x0.4x0.3] [N/A:N/A] Measurements L x W x D (cm) [1:0.22] [N/A:N/A] A (cm) : rea [1:0.066] [N/A:N/A] Volume (cm) : [1:-74.60%] [N/A:N/A] % Reduction in A [1:rea: 25.00%] [N/A:N/A] % Reduction in Volume: [1:Grade 2] [N/A:N/A] Classification: [1:Debridement - Excisional] [N/A:N/A] Debridement: [1:08:30] [N/A:N/A] Pre-procedure Verification/Time Out Taken: [1:Lidocaine] [N/A:N/A] Pain Control: [1:Callus, Subcutaneous,  Slough] [N/A:N/A] Tissue Debrided: [1:Skin/Subcutaneous Tissue] [N/A:N/A] Level: [1:0.28] [N/A:N/A] Debridement A (sq cm): [1:rea Curette] [N/A:N/A] Instrument: [1:Minimum] [N/A:N/A] Bleeding: [1:Pressure] [N/A:N/A] Hemostasis A chieved: [1:0] [N/A:N/A] Procedural Pain: [1:0] [N/A:N/A] Post Procedural Pain: [1:Procedure was tolerated well] [N/A:N/A] Debridement Treatment Response: [1:0.7x0.4x0.3] [N/A:N/A] Post Debridement Measurements L x W x D (cm) [1:0.066] [N/A:N/A] Post Debridement Volume: (cm) [1:Debridement] [N/A:N/A] Procedures Performed: [1:T Contact Cast otal] Treatment Notes Electronic Signature(s) Signed: 12/29/2020 9:44:44 AM By: Kalman Shan DO Signed: 12/30/2020 4:26:30 PM By: Rhae Hammock RN Entered By: Kalman Shan on 12/29/2020 08:41:25 -------------------------------------------------------------------------------- Multi-Disciplinary Care Plan Details Patient Name: Date of Service: [name] 12/29/2020 8:00 A M Medical Record Number:  [id number] Patient Account Number: [id number] Date of Birth/Sex: Treating RN: Mar 12, 1971 (50 y.o. [name], [name] Primary Care Tamasha Laplante: [name] Other Clinician: Referring Manju Kulkarni: Treating Miarose Lippert/Extender: [name] in Treatment: 5 Multidisciplinary Care Plan reviewed with physician Active Inactive Nutrition Nursing Diagnoses: Impaired glucose control: actual or potential Potential for alteratiion in Nutrition/Potential for imbalanced nutrition Goals: Patient/caregiver will maintain therapeutic glucose control Date Initiated: 11/19/2020 Target Resolution Date: 01/09/2021 Goal Status: Active Interventions: Assess patient nutrition upon admission and as needed per policy Provide education on elevated blood sugars and impact on wound healing Provide education on nutrition Treatment Activities: Education provided on Nutrition : 12/22/2020 Patient referred to Primary Care Physician for further nutritional evaluation : 11/19/2020 Notes: Wound/Skin Impairment Nursing Diagnoses: Impaired tissue integrity Knowledge deficit related to ulceration/compromised skin integrity Goals: Patient/caregiver will verbalize understanding of skin care regimen Date Initiated: 11/19/2020 Date Inactivated: 12/08/2020 Target Resolution Date: 12/17/2020 Goal Status: Met Ulcer/skin breakdown will have a volume reduction of 30% by week 4 Date Initiated: 11/19/2020 Target Resolution Date: 01/09/2021 Goal Status: Active Interventions: Assess patient/caregiver ability to obtain necessary supplies Assess patient/caregiver ability to perform ulcer/skin care regimen upon admission and as needed Assess ulceration(s) every visit Provide education on ulcer and skin care Treatment Activities: Skin care regimen initiated : 11/19/2020 Topical wound management initiated : 11/19/2020 Notes: Electronic Signature(s) Signed: 12/30/2020 4:26:30 PM By: Rhae Hammock  RN Entered By: Rhae Hammock on 12/29/2020 08:38:58 -------------------------------------------------------------------------------- Pain Assessment Details Patient Name: Date of Service: [name] 12/29/2020 8:00 A M Medical Record Number: [id number] Patient Account Number: [id number] Date of Birth/Sex: Treating RN: 1970/10/24 (50 y.o. [name], [name] Primary Care Britiney Blahnik: [name] Other Clinician: Referring Anatole Apollo: Treating Deseray Daponte/Extender: [name] in Treatment: 5 Active Problems Location of Pain Severity and Description of Pain Patient Has Paino No Site Locations Pain Management and Medication Current Pain Management: Electronic Signature(s) Signed: 12/29/2020 4:53:55 PM By: Sandre Kitty Signed: 12/30/2020 4:26:30 PM By: Rhae Hammock RN Entered By: Sandre Kitty on 12/29/2020 08:03:44 -------------------------------------------------------------------------------- Patient/Caregiver Education Details Patient Name: Date of Service: [name] 5/24/2022andnbsp8:00 A M Medical Record Number: [id number] Patient Account Number: [id number] Date of Birth/Gender: Treating RN: 1971-07-30 (50 y.o. [name], [name] Primary Care Physician: [name] Other Clinician: Referring Physician: Treating Physician/Extender: [name] in Treatment: 5 Education Assessment Education Provided To: Patient Education Topics Provided Elevated Blood Sugar/ Impact on Healing: Methods: Explain/Verbal Responses: State content correctly Electronic Signature(s) Signed: 12/30/2020 4:26:30 PM By: Rhae Hammock RN Entered By: Rhae Hammock on 12/29/2020 08:39:20 -------------------------------------------------------------------------------- Wound Assessment Details Patient Name: Date of Service: [name] 12/29/2020 8:00 A M Medical Record Number:  [id number] Patient Account Number: [id number] Date of Birth/Sex: Treating RN: 1971/03/07 (50 y.o. [name], [name] Primary  Care Lorrena Goranson: [name] Other Clinician: Referring Dellie Piasecki: Treating Ahriana Gunkel/Extender: [name] in Treatment: 5 Wound Status Wound Number: 1 Primary Diabetic Wound/Ulcer of the Lower Extremity Etiology: Wound Location: Right T Great oe Wound Open Wounding Event: Puncture Status: Date Acquired: 07/09/2020 Comorbid Anemia, Asthma, Sleep Apnea, Coronary Artery Disease, Weeks Of Treatment: 5 History: Hypertension, Type II Diabetes, Osteoarthritis, Neuropathy, Clustered Wound: No Confinement Anxiety Photos Wound Measurements Length: (cm) 0.7 Width: (cm) 0.4 Depth: (cm) 0.3 Area: (cm) 0.22 Volume: (cm) 0.066 % Reduction in Area: -74.6% % Reduction in Volume: 25% Epithelialization: Small (1-33%) Wound Description Classification: Grade 2 Wound Margin: Thickened Exudate Amount: Medium Exudate Type: Serosanguineous Exudate Color: red, brown Foul Odor After Cleansing: No Slough/Fibrino Yes Wound Bed Granulation Amount: Large (67-100%) Exposed Structure Granulation Quality: Red Fascia Exposed: No Necrotic Amount: Small (1-33%) Fat Layer (Subcutaneous Tissue) Exposed: Yes Necrotic Quality: Adherent Slough Tendon Exposed: No Muscle Exposed: No Joint Exposed: No Bone Exposed: No Treatment Notes Wound #1 (Toe Great) Wound Laterality: Right Cleanser Wound Cleanser Discharge Instruction: Cleanse the wound with wound cleanser prior to applying a clean dressing using gauze sponges, not tissue or cotton balls. Peri-Wound Care Topical Primary Dressing KerraCel Ag Gelling Fiber Dressing, 2x2 in (silver alginate) Discharge Instruction: Apply silver alginate to wound bed as instructed Secondary Dressing Woven Gauze Sponges 2x2 in Discharge Instruction: Apply over primary dressing as directed. Optifoam  Non-Adhesive Dressing, 4x4 in Discharge Instruction: ***Apply foam donut*** Apply over primary dressing as directed. Secured With Conforming Stretch Gauze Bandage, Sterile 2x75 (in/in) Discharge Instruction: Secure with stretch gauze as directed. Paper Tape, 2x10 (in/yd) Discharge Instruction: Secure dressing with tape as directed. Compression Wrap Compression Stockings Add-Ons Electronic Signature(s) Signed: 12/29/2020 4:53:55 PM By: Sandre Kitty Signed: 12/30/2020 4:26:30 PM By: Rhae Hammock RN Entered By: Sandre Kitty on 12/29/2020 16:39:58 -------------------------------------------------------------------------------- Vitals Details Patient Name: Date of Service: [name] 12/29/2020 8:00 A M Medical Record Number: [id number] Patient Account Number: [id number] Date of Birth/Sex: Treating RN: 27-May-1971 (50 y.o. [name], [name] Primary Care Olivier Frayre: [name] Other Clinician: Referring Dorcas Melito: Treating Nikiya Starn/Extender: [name] in Treatment: 5 Vital Signs Time Taken: 08:03 Temperature (F): 98.3 Height (in): 69 Pulse (bpm): 80 Weight (lbs): 250 Respiratory Rate (breaths/min): 18 Body Mass Index (BMI): 36.9 Blood Pressure (mmHg): 145/76 Reference Range: 80 - 120 mg / dl Electronic Signature(s) Signed: 12/29/2020 4:53:55 PM By: Sandre Kitty Entered By: Sandre Kitty on 12/29/2020 08:03:39

## 2020-12-30 NOTE — Progress Notes (Signed)
PEYTEN, PUNCHES ([id number]) Visit Report for 12/29/2020 Chief Complaint Document Details Patient Name: Date of Service: [name] 12/29/2020 8:00 A M Medical Record Number: [id number] Patient Account Number: [id number] Date of Birth/Sex: Treating RN: 08-Aug-1971 (50 y.o. [name], [name] Primary Care Provider: [name] Other Clinician: Referring Provider: Treating Provider/Extender: [name] in Treatment: 5 Information Obtained from: Patient Chief Complaint Right foot wound Electronic Signature(s) Signed: 12/29/2020 9:44:44 AM By: Kalman Shan DO Entered By: Kalman Shan on 12/29/2020 08:41:36 -------------------------------------------------------------------------------- Debridement Details Patient Name: Date of Service: [name] 12/29/2020 8:00 A M Medical Record Number: [id number] Patient Account Number: [id number] Date of Birth/Sex: Treating RN: [birth year] (50 y.o. [name], [name] Primary Care Provider: [name] Other Clinician: Referring Provider: Treating Provider/Extender: [name] in Treatment: 5 Debridement Performed for Assessment: Wound #1 Right T Great oe Performed By: Physician Kalman Shan, DO Debridement Type: Debridement Severity of Tissue Pre Debridement: Fat layer exposed Level of Consciousness (Pre-procedure): Awake and Alert Pre-procedure Verification/Time Out Yes - 08:30 Taken: Start Time: 08:30 Pain Control: Lidocaine T Area Debrided (L x W): otal 0.7 (cm) x 0.4 (cm) = 0.28 (cm) Tissue and other material debrided: Viable, Non-Viable, Callus, Slough, Subcutaneous, Skin: Dermis , Skin: Epidermis, Slough Level: Skin/Subcutaneous Tissue Debridement Description: Excisional Instrument: Curette Bleeding: Minimum Hemostasis Achieved: Pressure End Time: 08:34 Procedural Pain: 0 Post Procedural Pain: 0 Response to Treatment:  Procedure was tolerated well Level of Consciousness (Post- Awake and Alert procedure): Post Debridement Measurements of Total Wound Length: (cm) 0.7 Width: (cm) 0.4 Depth: (cm) 0.3 Volume: (cm) 0.066 Character of Wound/Ulcer Post Debridement: Improved Severity of Tissue Post Debridement: Fat layer exposed Post Procedure Diagnosis Same as Pre-procedure Electronic Signature(s) Signed: 12/29/2020 9:44:44 AM By: Kalman Shan DO Signed: 12/30/2020 4:26:30 PM By: Rhae Hammock RN Entered By: Rhae Hammock on 12/29/2020 08:35:58 -------------------------------------------------------------------------------- HPI Details Patient Name: Date of Service: [name] 12/29/2020 8:00 A M Medical Record Number: [id number] Patient Account Number: [id number] Date of Birth/Sex: Treating RN: May 08, 1971 (50 y.o. [name], [name] Primary Care Provider: [name] Other Clinician: Referring Provider: Treating Provider/Extender: [name] in Treatment: 5 History of Present Illness HPI Description: Ms. Fleda Pagel is a 50 year old female with past medical history of insulin-dependent type 2 diabetes, previous left pontine CVA, essential hypertension that presents to the clinic today for evaluation of her right foot wound. She states that in December 2021 she had cut open her right great toe on a door step . It became infected and she was treated with antibiotics. She started following with podiatry in February and has been using Betadine daily on the wound. She is also using a surgical shoe to help with offloading. She has diabetic neuropathy and does not report pain to the wound. She denies any purulent drainage, fever/chills or increased warmth or erythema to the foot. 4/21; patient has been using silver alginate every other day with dressing changes. She denies any purulent drainage, fever/chills or increased warmth or erythema to the  foot. She does however notice an odor. 4/26; patient presents for 1 week follow-up. She continues to use silver alginate every other day without any issues. She denies purulent drainage, fever/chills or increased warmth or erythema to the foot. She does not notice an odor anymore. She has no complaints today. 5/3; patient presents for 1 week follow-up. She states that she has an MRI scheduled for Friday. She  has been using silver alginate every other day to the wound. She has noted more maceration. She denies any pain or acute signs of infection. 5/10; patient presents for 1 week follow-up. Her MRI had to be rescheduled due to needing a prior authorization. It is scheduled for 5/19. Patient has been using Hydrofera Blue every other day. She denies acute signs of infection. She has no complaints today. 5/17; patient presents for 1 week follow-up. She has an MRI scheduled on 5/19. She has been using silver alginate every other day to the wound. She states that after she took a shower she was able to remove part of the hardened callus to the tip of her toe. She denies signs of infection. 5/24; patient presents for 1 week follow-up. She had the MRI done. She is ready to do the cast today. She denies signs of infection. Electronic Signature(s) Signed: 12/29/2020 9:44:44 AM By: Kalman Shan DO Entered By: Kalman Shan on 12/29/2020 08:42:06 -------------------------------------------------------------------------------- Physical Exam Details Patient Name: Date of Service: [name] 12/29/2020 8:00 A M Medical Record Number: [id number] Patient Account Number: [id number] Date of Birth/Sex: Treating RN: August 07, 1971 (50 y.o. [name], [name] Primary Care Provider: [name] Other Clinician: Referring Provider: Treating Provider/Extender: [name] in Treatment: 5 Constitutional respirations regular, non-labored and within target range for  patient.. Cardiovascular 2+ dorsalis pedis/posterior tibialis pulses. Psychiatric pleasant and cooperative. Notes Right great toe: Wound on the plantar aspect. There was callus present with nonviable tissue in the wound bed. This was debrided. There is some undermining at the 6 o'clock position although improved from last clinic visit. There is granulation tissue present in the wound bed with no signs of infection. Electronic Signature(s) Signed: 12/29/2020 9:44:44 AM By: Kalman Shan DO Entered By: Kalman Shan on 12/29/2020 08:43:02 -------------------------------------------------------------------------------- Physician Orders Details Patient Name: Date of Service: [name] 12/29/2020 8:00 A M Medical Record Number: [id number] Patient Account Number: [id number] Date of Birth/Sex: Treating RN: 05-Nov-1970 (50 y.o. 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 - on days when dressing is changed. Edema Control - Lymphedema / SCD / Other Elevate legs to the level of the heart or above for 30 minutes daily and/or when sitting, a frequency of: - throughout the day. Avoid standing for long periods of time. Off-Loading Total Contact Cast to Right Lower Extremity Wound Treatment Wound #1 - T Great oe Wound Laterality:  Right Cleanser: Wound Cleanser Every Other Day/15 Days Discharge Instructions: Cleanse the wound with wound cleanser prior to applying a clean dressing using gauze sponges, not tissue or cotton balls. Prim Dressing: KerraCel Ag Gelling Fiber Dressing, 2x2 in (silver alginate) Every Other Day/15 Days ary Discharge Instructions: Apply silver alginate to wound bed as instructed Secondary Dressing: Woven Gauze Sponges 2x2 in (Generic) Every Other Day/15 Days Discharge Instructions: Apply over primary dressing as directed. Secondary Dressing: Zetuvit Plus 4x8 in Every Other Day/15 Days Discharge Instructions: Apply over primary dressing  as directed. Secondary Dressing: Optifoam Non-Adhesive Dressing, 4x4 in Every Other Day/15 Days Discharge Instructions: ***Apply foam donut*** Apply over primary dressing as directed. Secured With: Child psychotherapist, Sterile 2x75 (in/in) (Generic) Every Other Day/15 Days Discharge Instructions: Secure with stretch gauze as directed. Secured With: Paper Tape, 2x10 (in/yd) (Generic) Every Other Day/15 Days Discharge Instructions: Secure dressing with tape as directed. Electronic Signature(s) Signed: 12/29/2020 9:44:44 AM By: Kalman Shan DO Entered By: Kalman Shan on 12/29/2020 08:43:20 -------------------------------------------------------------------------------- Problem List Details Patient Name: Date of Service: [name] 12/29/2020 8:00 A M Medical Record Number: [id number] Patient Account Number: [id number] Date of Birth/Sex: Treating RN: 16-Aug-1970 (50 y.o. [name], [name] Primary Care Provider: [name] Other Clinician: Referring Provider: Treating Provider/Extender: [name] in Treatment: 5 Active Problems ICD-10 Encounter Code Description Active Date MDM Diagnosis E11.621 Type 2 diabetes mellitus with foot ulcer 11/19/2020 No Yes L97.519 Non-pressure chronic ulcer  of other part of right foot with unspecified severity 12/15/2020 No Yes I63.9 Cerebral infarction, unspecified 11/19/2020 No Yes I10 Essential (primary) hypertension 11/19/2020 No Yes Inactive Problems Resolved Problems Electronic Signature(s) Signed: 12/29/2020 9:44:44 AM By: Kalman Shan DO Entered By: Kalman Shan on 12/29/2020 08:41:18 -------------------------------------------------------------------------------- Progress Note Details Patient Name: Date of Service: [name] 12/29/2020 8:00 A M Medical Record Number: [id number] Patient Account Number: [id number] Date of Birth/Sex: Treating RN: 03/14/1971 (50 y.o. [name], [name] Primary Care Provider: [name] Other Clinician: Referring Provider: Treating Provider/Extender: [name] in Treatment: 5 Subjective Chief Complaint Information obtained from Patient Right foot wound History of Present Illness (HPI) Ms. Naila Newhouse is a 50 year old female with past medical history of insulin-dependent type 2 diabetes, previous left pontine CVA, essential hypertension that presents to the clinic today for evaluation of her right foot wound. She states that in December 2021 she had cut open her right great toe on a door step . It became infected and she was treated with antibiotics. She started following with podiatry in February and has been using Betadine daily on the wound. She is also using a surgical shoe to help with offloading. She has diabetic neuropathy and does not report pain to the wound. She denies any purulent drainage, fever/chills or increased warmth or erythema to the foot. 4/21; patient has been using silver alginate every other day with dressing changes. She denies any purulent drainage, fever/chills or increased warmth or erythema to the foot. She does however notice an odor. 4/26; patient presents for 1 week follow-up. She continues to use silver alginate  every other day without any issues. She denies purulent drainage, fever/chills or increased warmth or erythema to the foot. She does not notice an odor anymore. She has no complaints today. 5/3; patient presents for 1 week follow-up. She states that she has an MRI scheduled for Friday. She has been using silver alginate every other day to the wound. She has noted more maceration. She denies any pain or acute signs of infection. 5/10; patient presents for 1 week follow-up. Her MRI had to be rescheduled due to needing a prior authorization. It is scheduled for 5/19. Patient has been using Hydrofera Blue every other day. She denies acute signs of infection. She has no complaints today. 5/17; patient presents for 1 week follow-up. She has an MRI scheduled on 5/19. She has been using silver alginate every other day to the wound. She states that after she took a shower she  was able to remove part of the hardened callus to the tip of her toe. She denies signs of infection. 5/24; patient presents for 1 week follow-up. She had the MRI done. She is ready to do the cast today. She denies signs of infection. Patient History Information obtained from Patient. Family History Cancer - Maternal Grandparents, Diabetes - Mother,Father,Maternal Grandparents,Paternal Grandparents, Heart Disease - Mother,Father,Maternal Grandparents,Paternal Grandparents,Siblings, Hypertension - Mother,Father, Kidney Disease - Maternal Grandparents, Stroke - Maternal Grandparents,Mother, No family history of Hereditary Spherocytosis, Lung Disease, Seizures, Thyroid Problems, Tuberculosis. Social History Former smoker - quit 3-4 yrs ago, Marital Status - Single, Alcohol Use - Rarely, Drug Use - Current History - marijuana, Caffeine Use - Rarely. Medical History Eyes Denies history of Cataracts, Glaucoma, Optic Neuritis Hematologic/Lymphatic Patient has history of Anemia Respiratory Patient has history of Asthma, Sleep Apnea -  CPAP Cardiovascular Patient has history of Coronary Artery Disease, Hypertension Endocrine Patient has history of Type II Diabetes Denies history of Type I Diabetes Genitourinary Denies history of End Stage Renal Disease Integumentary (Skin) Denies history of History of Burn Musculoskeletal Patient has history of Osteoarthritis Neurologic Patient has history of Neuropathy Oncologic Denies history of Received Chemotherapy, Received Radiation Psychiatric Patient has history of Confinement Anxiety Denies history of Anorexia/bulimia Hospitalization/Surgery History - achilles tendon rupture right. Medical A Surgical History Notes nd Constitutional Symptoms (General Health) vitamin D deficiency Eyes hypertensive retinopathy Cardiovascular hyperlipidemia Endocrine left thyroid nodule Genitourinary CKD Neurologic stroke Objective Constitutional respirations regular, non-labored and within target range for patient.. Vitals Time Taken: 8:03 AM, Height: 69 in, Weight: 250 lbs, BMI: 36.9, Temperature: 98.3 F, Pulse: 80 bpm, Respiratory Rate: 18 breaths/min, Blood Pressure: 145/76 mmHg. Cardiovascular 2+ dorsalis pedis/posterior tibialis pulses. Psychiatric pleasant and cooperative. General Notes: Right great toe: Wound on the plantar aspect. There was callus present with nonviable tissue in the wound bed. This was debrided. There is some undermining at the 6 o'clock position although improved from last clinic visit. There is granulation tissue present in the wound bed with no signs of infection. Integumentary (Hair, Skin) Wound #1 status is Open. Original cause of wound was Puncture. The date acquired was: 07/09/2020. The wound has been in treatment 5 weeks. The wound is located on the Right T Great. The wound measures 0.7cm length x 0.4cm width x 0.3cm depth; 0.22cm^2 area and 0.066cm^3 volume. 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 Her wound actually looks better today with no signs of infection. I think she would benefit greatly from a total contact cast. She is agreeable to this and would like to go ahead and get this on today. She states that she has someone to drive her and she will not be driving while using this cast. Procedures Wound #1 Pre-procedure diagnosis of Wound #1 is a Diabetic Wound/Ulcer of the Lower Extremity located on the Right T Great .Severity of Tissue Pre Debridement is: oe Fat layer exposed. There was a Excisional Skin/Subcutaneous Tissue Debridement with a total area of 0.28 sq cm performed by Kalman Shan, DO. With the following instrument(s): Curette to remove Viable and Non-Viable tissue/material. Material removed includes Callus, Subcutaneous Tissue, Slough, Skin: Dermis, and Skin: Epidermis after achieving pain control using Lidocaine. No specimens were taken. A time out was  conducted at 08:30, prior to the start of the procedure. A Minimum amount of bleeding was controlled with Pressure. The procedure was tolerated well with a pain level of 0 throughout and a pain level of 0 following the procedure. Post Debridement Measurements: 0.7cm length x 0.4cm width x 0.3cm depth; 0.066cm^3 volume. Character of Wound/Ulcer Post Debridement is improved. Severity of Tissue Post Debridement is: Fat layer exposed. Post procedure Diagnosis Wound #1: Same as Pre-Procedure Pre-procedure diagnosis of Wound #1 is a Diabetic Wound/Ulcer of the Lower Extremity located on the Right T Great . There was a T Contact Cast oe otal Procedure by Kalman Shan, DO. Post procedure Diagnosis Wound #1: Same as Pre-Procedure Plan Follow-up  Appointments: Return Appointment in: - Friday for TCC change.....***Extra time for TCC: 60 minutes!!**** Then back [facility] Bathing/ Shower/ Hygiene: May shower and wash wound with soap and water. - on days when dressing is changed. Edema Control - Lymphedema / SCD / Other: Elevate legs to the level of the heart or above for 30 minutes daily and/or when sitting, a frequency of: - throughout the day. Avoid standing for long periods of time. Off-Loading: T Contact Cast to Right Lower Extremity otal WOUND #1: - T Great oe Wound Laterality: Right Cleanser: Wound Cleanser Every Other Day/15 Days Discharge Instructions: Cleanse the wound with wound cleanser prior to applying a clean dressing using gauze sponges, not tissue or cotton balls. Prim Dressing: KerraCel Ag Gelling Fiber Dressing, 2x2 in (silver alginate) Every Other Day/15 Days ary Discharge Instructions: Apply silver alginate to wound bed as instructed Secondary Dressing: Woven Gauze Sponges 2x2 in (Generic) Every Other Day/15 Days Discharge Instructions: Apply over primary dressing as directed. Secondary Dressing: Zetuvit Plus 4x8 in Every Other Day/15 Days Discharge Instructions: Apply over primary dressing as directed. Secondary Dressing: Optifoam Non-Adhesive Dressing, 4x4 in Every Other Day/15 Days Discharge Instructions: ***Apply foam donut*** Apply over primary dressing as directed. Secured With: Child psychotherapist, Sterile 2x75 (in/in) (Generic) Every Other Day/15 Days Discharge Instructions: Secure with stretch gauze as directed. Secured With: Paper Tape, 2x10 (in/yd) (Generic) Every Other Day/15 Days Discharge Instructions: Secure dressing with tape as directed. 1. T contact cast with silver alginate otal 2. Follow-up in 1 week 3. In office sharp debridement Electronic Signature(s) Signed: 12/29/2020 9:44:44 AM By: Kalman Shan DO Entered By: Kalman Shan on 12/29/2020  08:45:28 -------------------------------------------------------------------------------- HxROS Details Patient Name: Date of Service: [name] 12/29/2020 8:00 A M Medical Record Number: [id number] Patient Account Number: [id number] Date of Birth/Sex: Treating RN: 10-10-1970 (50 y.o. [name], [name] Primary Care Provider: [name] Other Clinician: Referring Provider: Treating Provider/Extender: [name] in Treatment: 5 Information Obtained From Patient Constitutional Symptoms (General Health) Medical History: Past Medical History Notes: vitamin D deficiency Eyes Medical History: Negative for: Cataracts; Glaucoma; Optic Neuritis Past Medical History Notes: hypertensive retinopathy Hematologic/Lymphatic Medical History: Positive for: Anemia Respiratory Medical History: Positive for: Asthma; Sleep Apnea - CPAP Cardiovascular Medical History: Positive for: Coronary Artery Disease; Hypertension Past Medical History Notes: hyperlipidemia Endocrine Medical History: Positive for: Type II Diabetes Negative for: Type I Diabetes Past Medical History Notes: left thyroid nodule Time with diabetes: since age 9 Blood sugar tested every day: Yes Tested : 2-3 times per day Genitourinary Medical History: Negative for: End Stage Renal Disease Past Medical History Notes: CKD Integumentary (Skin) Medical History: Negative for: History of Burn Musculoskeletal Medical History: Positive for: Osteoarthritis Neurologic Medical History: Positive for: Neuropathy  Past Medical History Notes: stroke Oncologic Medical History: Negative for: Received Chemotherapy; Received Radiation Psychiatric Medical History: Positive for: Confinement Anxiety Negative for: Anorexia/bulimia Immunizations Pneumococcal Vaccine: Received Pneumococcal Vaccination: No Implantable Devices No devices added Hospitalization / Surgery  History Type of Hospitalization/Surgery achilles tendon rupture right Family and Social History Cancer: Yes - Maternal Grandparents; Diabetes: Yes - Mother,Father,Maternal Grandparents,Paternal Grandparents; Heart Disease: Yes - Mother,Father,Maternal Grandparents,Paternal Grandparents,Siblings; Hereditary Spherocytosis: No; Hypertension: Yes - Mother,Father; Kidney Disease: Yes - Maternal Grandparents; Lung Disease: No; Seizures: No; Stroke: Yes - Maternal Grandparents,Mother; Thyroid Problems: No; Tuberculosis: No; Former smoker - quit 3-4 yrs ago; Marital Status - Single; Alcohol Use: Rarely; Drug Use: Current History - marijuana; Caffeine Use: Rarely; Financial Concerns: No; Food, Clothing or Shelter Needs: No; Support System Lacking: No; Transportation Concerns: No Electronic Signature(s) Signed: 12/29/2020 9:44:44 AM By: Kalman Shan DO Signed: 12/30/2020 4:26:30 PM By: Rhae Hammock RN Entered By: Kalman Shan on 12/29/2020 08:42:17 -------------------------------------------------------------------------------- Advanced Modalities Screening Tool Details Patient Name: Date of Service: [name] 12/29/2020 8:00 A M Medical Record Number: [id number] Patient Account Number: [id number] Date of Birth/Sex: Treating RN: 1970/12/27 (50 y.o. [name], [name] Primary Care Provider: [name] Other Clinician: Referring Provider: Treating Provider/Extender: [name] in Treatment: 5 T Contact Cast Applied for Wound Assessment: otal Wound #1 Right T Great oe Performed By: Physician Kalman Shan, DO Post Procedure Diagnosis Same as Pre-procedure Electronic Signature(s) Signed: 12/29/2020 9:44:44 AM By: Kalman Shan DO Signed: 12/30/2020 4:26:30 PM By: Rhae Hammock RN Entered By: Rhae Hammock on 12/29/2020 08:36:13 -------------------------------------------------------------------------------- SuperBill  Details Patient Name: Date of Service: [name] 12/29/2020 Medical Record Number: [id number] Patient Account Number: [id number] Date of Birth/Sex: Treating RN: Dec 17, 1970 (50 y.o. 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 I63.9 Cerebral infarction, unspecified I10 Essential (primary) hypertension Facility Procedures CPT4 Code: [id number] Description: 79396 - DEB SUBQ TISSUE 20 SQ CM/< ICD-10 Diagnosis Description L97.519 Non-pressure chronic ulcer of other part of right foot with unspecified severi Modifier: ty Quantity: 1 Physician Procedures : CPT4 Code Description Modifier [id number] 11042 - WC PHYS SUBQ TISS 20 SQ CM ICD-10 Diagnosis Description 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

## 2021-01-01 ENCOUNTER — Encounter (HOSPITAL_BASED_OUTPATIENT_CLINIC_OR_DEPARTMENT_OTHER): Payer: Medicaid Other | Admitting: Internal Medicine

## 2021-01-01 ENCOUNTER — Other Ambulatory Visit: Payer: Self-pay

## 2021-01-01 DIAGNOSIS — L97519 Non-pressure chronic ulcer of other part of right foot with unspecified severity: Secondary | ICD-10-CM

## 2021-01-01 DIAGNOSIS — E11621 Type 2 diabetes mellitus with foot ulcer: Secondary | ICD-10-CM

## 2021-01-01 NOTE — Progress Notes (Addendum)
[name], [name] ([id number]) Visit Report for 01/01/2021 Arrival Information Details Patient Name: Date of Service: Teresa [name] 01/01/2021 8:00 A M Medical Record Number: [id number] Patient Account Number: [id number] Date of Birth/Sex: Treating RN: February 05, 1971 (50 y.o. [name], Meta.Reding Primary Care Yovanna Cogan: [name] Other Clinician: Referring [name]: Treating Jamira Barfuss/Extender: [name] in Treatment: 6 Visit Information History Since Last Visit Added or deleted any medications: No Patient Arrived: Ambulatory Any new allergies or adverse reactions: No Arrival Time: 08:00 Had a fall or experienced change in No Accompanied By: self activities of daily living that may affect Transfer Assistance: None risk of falls: Patient Identification Verified: Yes Signs or symptoms of abuse/neglect since last visito No Secondary Verification Process Completed: Yes Hospitalized since last visit: No Patient Requires Transmission-Based Precautions: No Implantable device outside of the clinic excluding No Patient Has Alerts: No cellular tissue based products placed in the center since last visit: Has Dressing in Place as Prescribed: Yes Has Footwear/Offloading in Place as Prescribed: Yes Right: T Contact Cast otal Pain Present Now: No Electronic Signature(s) Signed: 01/01/2021 1:38:01 PM By: Deon Pilling Entered By: Deon Pilling on 01/01/2021 08:02:48 -------------------------------------------------------------------------------- Encounter Discharge Information Details Patient Name: Date of Service: Teresa [name] 01/01/2021 8:00 A M Medical Record Number: [id number] Patient Account Number: [id number] Date of Birth/Sex: Treating RN: Dec 17, 1970 (50 y.o. [name], [name] Primary Care Patirica Longshore: [name] Other Clinician: Referring Rhiley Solem: Treating [name]/Extender: [name] in Treatment:  6 Encounter Discharge Information Items Discharge Condition: Stable Ambulatory Status: Ambulatory Discharge Destination: Home Transportation: Private Auto Accompanied By: self Schedule Follow-up Appointment: Yes Clinical Summary of Care: Electronic Signature(s) Signed: 01/01/2021 1:38:01 PM By: Deon Pilling Entered By: Deon Pilling on 01/01/2021 13:35:09 -------------------------------------------------------------------------------- Lower Extremity Assessment Details Patient Name: Date of Service: [name] [facility] NDA E. 01/01/2021 8:00 A M Medical Record Number: [id number] Patient Account Number: [id number] Date of Birth/Sex: Treating RN: 04-25-1971 (50 y.o. [name], [name] Primary Care Arnaldo Heffron: [name] Other Clinician: Referring Judith Demps: Treating Kandace Elrod/Extender: [name] in Treatment: 6 Edema Assessment Assessed: [Left: No] [Right: Yes] Edema: [Left: N] [Right: o] Calf Left: Right: Point of Measurement: From Medial Instep 35.5 cm Ankle Left: Right: Point of Measurement: From Medial Instep 23.5 cm Vascular Assessment Pulses: Dorsalis Pedis Palpable: [Right:Yes] Electronic Signature(s) Signed: 01/01/2021 1:38:01 PM By: Deon Pilling Entered By: Deon Pilling on 01/01/2021 08:03:50 -------------------------------------------------------------------------------- Multi Wound Chart Details Patient Name: Date of Service: Teresa [name] 01/01/2021 8:00 A M Medical Record Number: [id number] Patient Account Number: [id number] Date of Birth/Sex: Treating RN: Aug 29, 1970 (50 y.o. [name] Primary Care Evart Mcdonnell: [name] Other Clinician: Referring [name]: Treating [name]/Extender: [name] in Treatment: 6 Vital Signs Height(in): 69 Pulse(bpm): 71 Weight(lbs): 250 Blood Pressure(mmHg): 162/82 Body Mass Index(BMI): 37 Temperature(F): 97.8 Respiratory  Rate(breaths/min): 20 Photos: [1:No Photos Right T Great oe] [N/A:N/A N/A] Wound Location: [1:Puncture] [N/A:N/A] Wounding Event: [1:Diabetic Wound/Ulcer of the Lower] [N/A:N/A] Primary Etiology: [1:Extremity Anemia, Asthma, Sleep Apnea,] [N/A:N/A] Comorbid History: [1:Coronary Artery Disease, Hypertension, Type II Diabetes, Osteoarthritis, Neuropathy, Confinement Anxiety 07/09/2020] [N/A:N/A] Date Acquired: [1:6] [N/A:N/A] Weeks of Treatment: [1:Open] [N/A:N/A] Wound Status: [1:0.7x0.7x0.3] [N/A:N/A] Measurements L x W x D (cm) [1:0.385] [N/A:N/A] A (cm) : rea [1:0.115] [N/A:N/A] Volume (cm) : [1:-205.60%] [N/A:N/A] % Reduction in A rea: [1:-30.70%] [N/A:N/A] % Reduction in Volume: [1:Grade 2] [N/A:N/A] Classification: [1:Medium] [N/A:N/A] Exudate A mount: [1:Serosanguineous] [N/A:N/A] Exudate Type: [1:red, brown] [N/A:N/A] Exudate  Color: [1:Thickened] [N/A:N/A] Wound Margin: [1:Large (67-100%)] [N/A:N/A] Granulation A mount: [1:Red] [N/A:N/A] Granulation Quality: [1:Small (1-33%)] [N/A:N/A] Necrotic A mount: [1:Fat Layer (Subcutaneous Tissue): Yes N/A] Exposed Structures: [1:Fascia: No Tendon: No Muscle: No Joint: No Bone: No Small (1-33%)] [N/A:N/A] Epithelialization: [1:callous to periwound.] [N/A:N/A] Assessment Notes: [1:T Contact Cast otal] [N/A:N/A] Treatment Notes Electronic Signature(s) Signed: 01/01/2021 8:30:49 AM By: Kalman Shan DO Signed: 01/01/2021 1:31:18 PM By: Lorrin Jackson Entered By: Kalman Shan on 01/01/2021 08:27:03 -------------------------------------------------------------------------------- Multi-Disciplinary Care Plan Details Patient Name: Date of Service: Teresa [name] 01/01/2021 8:00 A M Medical Record Number: [id number] Patient Account Number: [id number] Date of Birth/Sex: Treating RN: 1970-08-20 (50 y.o. [name] Primary Care Dailen Mcclish: [name] Other Clinician: Referring Kalilah Barua: Treating Adonnis Salceda/Extender:  [name] in Treatment: [place] reviewed with physician Active Inactive Nutrition Nursing Diagnoses: Impaired glucose control: actual or potential Potential for alteratiion in Nutrition/Potential for imbalanced nutrition Goals: Patient/caregiver will maintain therapeutic glucose control Date Initiated: 11/19/2020 Target Resolution Date: 01/09/2021 Goal Status: Active Interventions: Assess patient nutrition upon admission and as needed per policy Provide education on elevated blood sugars and impact on wound healing Provide education on nutrition Treatment Activities: Education provided on Nutrition : 12/22/2020 Patient referred to Primary Care Physician for further nutritional evaluation : 11/19/2020 Notes: Wound/Skin Impairment Nursing Diagnoses: Impaired tissue integrity Knowledge deficit related to ulceration/compromised skin integrity Goals: Patient/caregiver will verbalize understanding of skin care regimen Date Initiated: 11/19/2020 Date Inactivated: 12/08/2020 Target Resolution Date: 12/17/2020 Goal Status: Met Ulcer/skin breakdown will have a volume reduction of 30% by week 4 Date Initiated: 11/19/2020 Target Resolution Date: 01/09/2021 Goal Status: Active Interventions: Assess patient/caregiver ability to obtain necessary supplies Assess patient/caregiver ability to perform ulcer/skin care regimen upon admission and as needed Assess ulceration(s) every visit Provide education on ulcer and skin care Treatment Activities: Skin care regimen initiated : 11/19/2020 Topical wound management initiated : 11/19/2020 Notes: Electronic Signature(s) Signed: 01/01/2021 8:02:13 AM By: Lorrin Jackson Entered By: Lorrin Jackson on 01/01/2021 08:02:12 -------------------------------------------------------------------------------- Pain Assessment Details Patient Name: Date of Service: Teresa [name] 01/01/2021 8:00 A M Medical  Record Number: [id number] Patient Account Number: [id number] Date of Birth/Sex: Treating RN: 1971-01-10 (50 y.o. [name] Primary Care Myeshia Fojtik: [name] Other Clinician: Referring Ayaz Sondgeroth: Treating Juliany Daughety/Extender: [name] in Treatment: 6 Active Problems Location of Pain Severity and Description of Pain Patient Has Paino No Site Locations Rate the pain. Current Pain Level: 0 Pain Management and Medication Current Pain Management: Medication: No Cold Application: No Rest: No Massage: No Activity: No T.CurtisN.S.: No Heat Application: No Leg drop or elevation: No Is the Current Pain Management Adequate: Adequate How does your wound impact your activities of daily livingo Sleep: No Bathing: No Appetite: No Relationship With Others: No Bladder Continence: No Emotions: No Bowel Continence: No Work: No Toileting: No Drive: No Dressing: No Hobbies: No Electronic Signature(s) Signed: 01/01/2021 1:38:01 PM By: Deon Pilling Entered By: Deon Pilling on 01/01/2021 08:03:36 -------------------------------------------------------------------------------- Patient/Caregiver Education Details Patient Name: Date of Service: Teresa [name] 5/27/2022andnbsp8:00 A M Medical Record Number: [id number] Patient Account Number: [id number] Date of Birth/Gender: Treating RN: 1971/07/16 (50 y.o. [name] Primary Care Physician: [name] Other Clinician: Referring Physician: Treating Physician/Extender: [name] in Treatment: 6 Education Assessment Education Provided To: Patient Education Topics Provided Elevated Blood Sugar/ Impact on Healing: Methods: Explain/Verbal, Printed Responses: State content correctly Offloading: Methods: Explain/Verbal, Printed Responses: State  content correctly Wound/Skin Impairment: Methods: Explain/Verbal, Printed Responses: State content  correctly Electronic Signature(s) Signed: 01/01/2021 1:31:18 PM By: Lorrin Jackson Entered By: Lorrin Jackson on 01/01/2021 08:02:39 -------------------------------------------------------------------------------- Wound Assessment Details Patient Name: Date of Service: Teresa [name] 01/01/2021 8:00 A M Medical Record Number: [id number] Patient Account Number: [id number] Date of Birth/Sex: Treating RN: 12-Mar-1971 (50 y.o. [name], [name] Primary Care Adriena Manfre: [name] Other Clinician: Referring Chalsea Darko: Treating Mylah Baynes/Extender: [name] in Treatment: 6 Wound Status Wound Number: 1 Primary Diabetic Wound/Ulcer of the Lower Extremity Etiology: Wound Location: Right T Great oe Wound Open Wounding Event: Puncture Status: Date Acquired: 07/09/2020 Comorbid Anemia, Asthma, Sleep Apnea, Coronary Artery Disease, Weeks Of Treatment: 6 History: Hypertension, Type II Diabetes, Osteoarthritis, Neuropathy, Clustered Wound: No Confinement Anxiety Wound Measurements Length: (cm) 0.7 Width: (cm) 0.7 Depth: (cm) 0.3 Area: (cm) 0.385 Volume: (cm) 0.115 % Reduction in Area: -205.6% % Reduction in Volume: -30.7% Epithelialization: Small (1-33%) Tunneling: No Undermining: No Wound Description Classification: Grade 2 Wound Margin: Thickened Exudate Amount: Medium Exudate Type: Serosanguineous Exudate Color: red, brown Foul Odor After Cleansing: No Slough/Fibrino Yes Wound Bed Granulation Amount: Large (67-100%) Exposed Structure Granulation Quality: Red Fascia Exposed: No Necrotic Amount: Small (1-33%) Fat Layer (Subcutaneous Tissue) Exposed: Yes Necrotic Quality: Adherent Slough Tendon Exposed: No Muscle Exposed: No Joint Exposed: No Bone Exposed: No Assessment Notes callous to periwound. Treatment Notes Wound #1 (Toe Great) Wound Laterality: Right Cleanser Wound Cleanser Discharge Instruction: Cleanse the wound with  wound cleanser prior to applying a clean dressing using gauze sponges, not tissue or cotton balls. Peri-Wound Care Topical Primary Dressing KerraCel Ag Gelling Fiber Dressing, 2x2 in (silver alginate) Discharge Instruction: Apply silver alginate to wound bed as instructed Secondary Dressing Zetuvit Plus 4x8 in Discharge Instruction: Apply over primary dressing as directed. Woven Gauze Sponges 2x2 in Discharge Instruction: Apply over primary dressing as directed. Optifoam Non-Adhesive Dressing, 4x4 in Discharge Instruction: ***Apply foam donut*** Apply over primary dressing as directed. Secured With Conforming Stretch Gauze Bandage, Sterile 2x75 (in/in) Discharge Instruction: Secure with stretch gauze as directed. Paper Tape, 2x10 (in/yd) Discharge Instruction: Secure dressing with tape as directed. Compression Wrap Compression Stockings Add-Ons Notes TCC applied to patient right leg last layer applied by Dr. Heber Belen. Electronic Signature(s) Signed: 01/01/2021 1:38:01 PM By: Deon Pilling Entered By: Deon Pilling on 01/01/2021 08:04:15 -------------------------------------------------------------------------------- Vitals Details Patient Name: Date of Service: Teresa [name] 01/01/2021 8:00 A M Medical Record Number: [id number] Patient Account Number: [id number] Date of Birth/Sex: Treating RN: 1970-08-21 (50 y.o. [name], [name] Primary Care Sora Vrooman: [name] Other Clinician: Referring Maddon Horton: Treating Keighley Deckman/Extender: [name] in Treatment: 6 Vital Signs Time Taken: 08:00 Temperature (F): 97.8 Height (in): 69 Pulse (bpm): 83 Weight (lbs): 250 Respiratory Rate (breaths/min): 20 Body Mass Index (BMI): 36.9 Blood Pressure (mmHg): 162/82 Reference Range: 80 - 120 mg / dl Electronic Signature(s) Signed: 01/01/2021 1:38:01 PM By: Deon Pilling Entered By: Deon Pilling on 01/01/2021 08:03:26

## 2021-01-01 NOTE — Progress Notes (Signed)
[name], [name] ([id number]) Visit Report for 01/01/2021 Chief Complaint Document Details Patient Name: Date of Service: [name] NDA E. 01/01/2021 8:00 A M Medical Record Number: [id number] Patient Account Number: [id number] Date of Birth/Sex: Treating RN: Jul 26, 1971 (50 y.o. [name] Primary Care Provider: [name] Other Clinician: Referring Provider: Treating Provider/Extender: [name] in Treatment: 6 Information Obtained from: Patient Chief Complaint Right foot wound Electronic Signature(s) Signed: 01/01/2021 8:30:49 AM By: Kalman Shan DO Entered By: Kalman Shan on 01/01/2021 08:27:14 -------------------------------------------------------------------------------- HPI Details Patient Name: Date of Service: [name] NDA E. 01/01/2021 8:00 A M Medical Record Number: [id number] Patient Account Number: [id number] Date of Birth/Sex: Treating RN: Oct 04, 1970 (50 y.o. [name] Primary Care Provider: [name] Other Clinician: Referring Provider: Treating Provider/Extender: [name] in Treatment: 6 History of Present Illness HPI Description: [name] is a 50 year old female with past medical history of insulin-dependent type 2 diabetes, previous left pontine CVA, essential hypertension that presents to the clinic today for evaluation of her right foot wound. She states that in December 2021 she had cut open her right great toe on a door step . It became infected and she was treated with antibiotics. She started following with podiatry in February and has been using Betadine daily on the wound. She is also using a surgical shoe to help with offloading. She has diabetic neuropathy and does not report pain to the wound. She denies any purulent drainage, fever/chills or increased warmth or erythema to the foot. 4/21; patient has been using silver alginate every  other day with dressing changes. She denies any purulent drainage, fever/chills or increased warmth or erythema to the foot. She does however notice an odor. 4/26; patient presents for 1 week follow-up. She continues to use silver alginate every other day without any issues. She denies purulent drainage, fever/chills or increased warmth or erythema to the foot. She does not notice an odor anymore. She has no complaints today. 5/3; patient presents for 1 week follow-up. She states that she has an MRI scheduled for Friday. She has been using silver alginate every other day to the wound. She has noted more maceration. She denies any pain or acute signs of infection. 5/10; patient presents for 1 week follow-up. Her MRI had to be rescheduled due to needing a prior authorization. It is scheduled for 5/19. Patient has been using Hydrofera Blue every other day. She denies acute signs of infection. She has no complaints today. 5/17; patient presents for 1 week follow-up. She has an MRI scheduled on 5/19. She has been using silver alginate every other day to the wound. She states that after she took a shower she was able to remove part of the hardened callus to the tip of her toe. She denies signs of infection. 5/24; patient presents for 1 week follow-up. She had the MRI done. She is ready to do the cast today. She denies signs of infection. 5/27; patient presents for first cast change. She denies any issues for the past couple days while using the cast. She overall feels well. She denies signs of infection. Electronic Signature(s) Signed: 01/01/2021 8:30:49 AM By: Kalman Shan DO Entered By: Kalman Shan on 01/01/2021 08:28:06 -------------------------------------------------------------------------------- Physical Exam Details Patient Name: Date of Service: [name] NDA E. 01/01/2021 8:00 A M Medical Record Number: [id number] Patient Account Number: [id number] Date of Birth/Sex: Treating  RN: [birth year] (50 y.o. [name] Primary Care  Provider: [name] Other Clinician: Referring Provider: Treating Provider/Extender: [name] in Treatment: 6 Constitutional respirations regular, non-labored and within target range for patient.Marland Kitchen Psychiatric pleasant and cooperative. Notes Right great toe: Wound on plantar aspect. Unchanged from last clinic visit Electronic Signature(s) Signed: 01/01/2021 8:30:49 AM By: Kalman Shan DO Entered By: Kalman Shan on 01/01/2021 08:28:52 -------------------------------------------------------------------------------- Physician Orders Details Patient Name: Date of Service: [name] NDA E. 01/01/2021 8:00 A M Medical Record Number: [id number] Patient Account Number: [id number] Date of Birth/Sex: Treating RN: July 29, 1971 (50 y.o. 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 - Tuesday with Dr. Heber [place] Return A ppointment in: - ***Extra time for TCC: 60 minutes!!**** Return A Bathing/ Shower/ Hygiene May shower and wash wound with soap and water. - on days when dressing is changed. Edema Control - Lymphedema / SCD / Other Elevate legs to the level of the heart or above for 30 minutes daily and/or when sitting, a frequency of: - throughout the day. Avoid standing for long periods of time. Off-Loading Total Contact Cast to Right Lower Extremity Wound Treatment Wound #1 - T Great oe Wound Laterality:  Right Cleanser: Wound Cleanser Every Other Day/15 Days Discharge Instructions: Cleanse the wound with wound cleanser prior to applying a clean dressing using gauze sponges, not tissue or cotton balls. Prim Dressing: KerraCel Ag Gelling Fiber Dressing, 2x2 in (silver alginate) Every Other Day/15 Days ary Discharge Instructions: Apply silver alginate to wound bed as instructed Secondary Dressing: Woven Gauze Sponges 2x2 in (Generic) Every Other Day/15 Days Discharge Instructions: Apply over primary dressing as directed. Secondary Dressing: Zetuvit Plus 4x8 in Every Other Day/15 Days Discharge Instructions: Apply over primary dressing as directed. Secondary Dressing: Optifoam Non-Adhesive Dressing, 4x4 in Every Other Day/15 Days Discharge Instructions: ***Apply foam donut*** Apply over primary dressing as directed. Secured With: Child psychotherapist, Sterile 2x75 (in/in) (Generic) Every Other Day/15 Days Discharge Instructions: Secure with stretch gauze as directed. Secured With: Paper Tape, 2x10 (in/yd) (Generic) Every Other Day/15 Days Discharge Instructions: Secure dressing with tape as directed. Electronic Signature(s) Signed: 01/01/2021 8:30:49 AM By: Kalman Shan DO Previous Signature: 01/01/2021 8:17:44 AM Version By: Lorrin Jackson Entered By: Kalman Shan on 01/01/2021 08:29:06 -------------------------------------------------------------------------------- Problem List Details Patient Name: Date of Service: [name] NDA E. 01/01/2021 8:00 A M Medical Record Number: [id number] Patient Account Number: [id number] Date of Birth/Sex: Treating RN: 1970/11/04 (50 y.o. 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 01/01/2021 8:00 A M Medical Record Number:  [id number] Patient Account Number: [id number] Date of Birth/Sex: Treating RN: [birth year] (50 y.o. [name] Primary Care Provider: [name] Other Clinician: Referring Provider: Treating Provider/Extender: [name] in Treatment: 6 Subjective Chief Complaint Information obtained from Patient Right foot wound History of Present Illness (HPI) Ms. Leshaun Rudge is a 50 year old female with past medical history of insulin-dependent type 2 diabetes, previous left pontine CVA, essential hypertension that presents to the clinic today for evaluation of her right foot wound. She states that in December 2021 she had cut open her right great toe on a door step . It became infected and she was treated with antibiotics. She started following with podiatry in February and has been using Betadine daily on the wound. She is also using a surgical shoe to help with offloading. She has diabetic neuropathy and does not report pain to the wound. She denies any purulent drainage, fever/chills or increased warmth or erythema to the foot. 4/21; patient has been using silver alginate every other day with dressing changes. She denies any purulent drainage, fever/chills or increased warmth  or erythema to the foot. She does however notice an odor. 4/26; patient presents for 1 week follow-up. She continues to use silver alginate every other day without any issues. She denies purulent drainage, fever/chills or increased warmth or erythema to the foot. She does not notice an odor anymore. She has no complaints today. 5/3; patient presents for 1 week follow-up. She states that she has an MRI scheduled for Friday. She has been using silver alginate every other day to the wound. She has noted more maceration. She denies any pain or acute signs of infection. 5/10; patient presents for 1 week follow-up. Her MRI had to be rescheduled due to needing a prior authorization. It is scheduled for 5/19. Patient has been using Hydrofera Blue every other day. She denies acute signs of infection. She has no complaints today. 5/17; patient presents for 1 week follow-up. She has an MRI scheduled on 5/19. She has been using silver alginate every other day to the wound. She states that after she took a shower she was able to remove part of the hardened callus to the tip of her toe. She denies signs of infection. 5/24; patient presents for 1 week follow-up. She had the MRI done. She is ready to do the cast today. She denies signs of infection. 5/27; patient presents for first cast change. She denies any issues for the past couple days while using the cast. She overall feels well. She denies signs of infection. Patient History Information obtained from Patient. Family History Cancer - Maternal Grandparents, Diabetes - Mother,Father,Maternal Grandparents,Paternal Grandparents, Heart Disease - Mother,Father,Maternal Grandparents,Paternal Grandparents,Siblings, Hypertension - Mother,Father, Kidney Disease - Maternal Grandparents, Stroke - Maternal Grandparents,Mother, No family history of Hereditary Spherocytosis, Lung Disease, Seizures, Thyroid Problems, Tuberculosis. Social History Former smoker - quit 3-4  yrs ago, Marital Status - Single, Alcohol Use - Rarely, Drug Use - Current History - marijuana, Caffeine Use - Rarely. Medical History Eyes Denies history of Cataracts, Glaucoma, Optic Neuritis Hematologic/Lymphatic Patient has history of Anemia Respiratory Patient has history of Asthma, Sleep Apnea - CPAP Cardiovascular Patient has history of Coronary Artery Disease, Hypertension Endocrine Patient has history of Type II Diabetes Denies history of Type I Diabetes Genitourinary Denies history of End Stage Renal Disease Integumentary (Skin) Denies history of History of Burn Musculoskeletal Patient has history of Osteoarthritis Neurologic Patient has history of Neuropathy Oncologic Denies history of Received  Chemotherapy, Received Radiation Psychiatric Patient has history of Confinement Anxiety Denies history of Anorexia/bulimia Hospitalization/Surgery History - achilles tendon rupture right. Medical A Surgical History Notes nd Constitutional Symptoms (General Health) vitamin D deficiency Eyes hypertensive retinopathy Cardiovascular hyperlipidemia Endocrine left thyroid nodule Genitourinary CKD Neurologic stroke Objective Constitutional respirations regular, non-labored and within target range for patient.. Vitals Time Taken: 8:00 AM, Height: 69 in, Weight: 250 lbs, BMI: 36.9, Temperature: 97.8 F, Pulse: 83 bpm, Respiratory Rate: 20 breaths/min, Blood Pressure: 162/82 mmHg. Psychiatric pleasant and cooperative. General Notes: Right great toe: Wound on plantar aspect. Unchanged from last clinic visit Integumentary (Hair, Skin) Wound #1 status is Open. Original cause of wound was Puncture. The date acquired was: 07/09/2020. The wound has been in treatment 6 weeks. The wound is located on the Right T Great. The wound measures 0.7cm length x 0.7cm width x 0.3cm depth; 0.385cm^2 area and 0.115cm^3 volume. There is Fat Layer oe (Subcutaneous Tissue) exposed. There is no  tunneling or undermining noted. There is a medium amount of serosanguineous drainage noted. The wound margin is thickened. There is large (67-100%) red granulation within the wound bed. There is a small (1-33%) amount of necrotic tissue within the wound bed including Adherent Slough. General Notes: callous to periwound. 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 Patient had no issues with the contact cast. She is here for her first cast change. I will see her back next week. Procedures Wound #1 Pre-procedure diagnosis of Wound #1 is a Diabetic Wound/Ulcer of the Lower Extremity located on the Right T Great . There was a T Contact Cast oe otal Procedure by Kalman Shan, DO. Post procedure Diagnosis Wound #1: Same as Pre-Procedure Plan Follow-up Appointments: Return Appointment in 1 week. - Tuesday with Dr. Heber [place] Return Appointment in: - ***Extra time for TCC: 60 minutes!!**** Bathing/ Shower/ Hygiene: May shower and wash wound with soap and water. - on days when dressing is changed. Edema Control - Lymphedema / SCD / Other: Elevate legs to the level of the heart or above for 30 minutes daily and/or when sitting, a frequency of: - throughout the day. Avoid standing for long periods of time. Off-Loading: T Contact Cast to Right Lower Extremity otal WOUND #1: - T Great Wound Laterality: Right oe Cleanser: Wound Cleanser Every Other Day/15 Days Discharge Instructions: Cleanse the wound with wound cleanser prior to applying a clean dressing using gauze sponges, not tissue or cotton balls. Prim Dressing: KerraCel Ag Gelling Fiber Dressing, 2x2 in (silver alginate) Every Other Day/15 Days ary Discharge Instructions: Apply silver alginate to wound bed as instructed Secondary Dressing: Woven  Gauze Sponges 2x2 in (Generic) Every Other Day/15 Days Discharge Instructions: Apply over primary dressing as directed. Secondary Dressing: Zetuvit Plus 4x8 in Every Other Day/15 Days Discharge Instructions: Apply over primary dressing as directed. Secondary Dressing: Optifoam Non-Adhesive Dressing, 4x4 in Every Other Day/15 Days Discharge Instructions: ***Apply foam donut*** Apply over primary dressing as directed. Secured With: Child psychotherapist, Sterile 2x75 (in/in) (Generic) Every Other Day/15 Days Discharge Instructions: Secure with stretch gauze as directed. Secured With: Paper Tape, 2x10 (in/yd) (Generic) Every Other Day/15 Days Discharge Instructions: Secure dressing with tape as directed. 1. T contact cast With silver alginate otal Electronic Signature(s) Signed: 01/01/2021 8:30:49 AM By: Kalman Shan DO Entered By: Kalman Shan on 01/01/2021 08:29:54 -------------------------------------------------------------------------------- HxROS Details Patient  Name: Date of Service: [name] NDA E. 01/01/2021 8:00 A M Medical Record Number: [id number] Patient Account Number: [id number] Date of Birth/Sex: Treating RN: May 24, 1971 (50 y.o. [name] Primary Care Provider: [name] Other Clinician: Referring Provider: Treating Provider/Extender: [name] in Treatment: 6 Information Obtained From Patient Constitutional Symptoms (General Health) Medical History: Past Medical History Notes: vitamin D deficiency Eyes Medical History: Negative for: Cataracts; Glaucoma; Optic Neuritis Past Medical History Notes: hypertensive retinopathy Hematologic/Lymphatic Medical History: Positive for: Anemia Respiratory Medical History: Positive for: Asthma; Sleep Apnea - CPAP Cardiovascular Medical History: Positive for: Coronary Artery Disease; Hypertension Past Medical History  Notes: hyperlipidemia Endocrine Medical History: Positive for: Type II Diabetes Negative for: Type I Diabetes Past Medical History Notes: left thyroid nodule Time with diabetes: since age 44 Blood sugar tested every day: Yes Tested : 2-3 times per day Genitourinary Medical History: Negative for: End Stage Renal Disease Past Medical History Notes: CKD Integumentary (Skin) Medical History: Negative for: History of Burn Musculoskeletal Medical History: Positive for: Osteoarthritis Neurologic Medical History: Positive for: Neuropathy Past Medical History Notes: stroke Oncologic Medical History: Negative for: Received Chemotherapy; Received Radiation Psychiatric Medical History: Positive for: Confinement Anxiety Negative for: Anorexia/bulimia Immunizations Pneumococcal Vaccine: Received Pneumococcal Vaccination: No Implantable Devices No devices added Hospitalization / Surgery History Type of Hospitalization/Surgery achilles tendon rupture right Family and Social History Cancer: Yes - Maternal Grandparents; Diabetes: Yes - Mother,Father,Maternal Grandparents,Paternal Grandparents; Heart Disease: Yes - Mother,Father,Maternal Grandparents,Paternal Grandparents,Siblings; Hereditary Spherocytosis: No; Hypertension: Yes - Mother,Father; Kidney Disease: Yes - Maternal Grandparents; Lung Disease: No; Seizures: No; Stroke: Yes - Maternal Grandparents,Mother; Thyroid Problems: No; Tuberculosis: No; Former smoker - quit 3-4 yrs ago; Marital Status - Single; Alcohol Use: Rarely; Drug Use: Current History - marijuana; Caffeine Use: Rarely; Financial Concerns: No; Food, Clothing or Shelter Needs: No; Support System Lacking: No; Transportation Concerns: No Electronic Signature(s) Signed: 01/01/2021 8:30:49 AM By: Kalman Shan DO Signed: 01/01/2021 1:31:18 PM By: Lorrin Jackson Entered By: Kalman Shan on 01/01/2021  08:28:12 -------------------------------------------------------------------------------- Total Contact Cast Details Patient Name: Date of Service: [name] NDA E. 01/01/2021 8:00 A M Medical Record Number: [id number] Patient Account Number: [id number] Date of Birth/Sex: Treating RN: 1970/08/28 (50 y.o. [name] Primary Care Provider: [name] Other Clinician: Referring Provider: Treating Provider/Extender: [name] in Treatment: 6 T Contact Cast Applied for Wound Assessment: otal Wound #1 Right T Great oe Performed By: Physician Kalman Shan, DO Post Procedure Diagnosis Same as Pre-procedure Electronic Signature(s) Signed: 01/01/2021 8:26:40 AM By: Lorrin Jackson Signed: 01/01/2021 8:30:49 AM By: Kalman Shan DO Entered By: Lorrin Jackson on 01/01/2021 08:26:40 -------------------------------------------------------------------------------- Otho Details Patient Name: Date of Service: [name] NDA E. 01/01/2021 Medical Record Number: [id number] Patient Account Number: [id number] Date of Birth/Sex: Treating RN: 1970-11-28 (50 y.o. 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 I63.9 Cerebral infarction, unspecified I10 Essential (primary) hypertension Facility Procedures CPT4 Code: [id number] Description: [phone] - APPLY TOTAL CONTACT LEG CAST ICD-10 Diagnosis Description L97.519 Non-pressure chronic ulcer of other part of right foot with unspecified severit E11.621 Type 2 diabetes mellitus with foot ulcer Modifier: y Quantity: 1 Physician Procedures : CPT4 Code Description Modifier  [id number] - WC PHYS APPLY TOTAL CONTACT CAST  ICD-10 Diagnosis Description 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

## 2021-01-05 ENCOUNTER — Telehealth (INDEPENDENT_AMBULATORY_CARE_PROVIDER_SITE_OTHER): Payer: Medicaid Other | Admitting: Obstetrics

## 2021-01-05 ENCOUNTER — Other Ambulatory Visit: Payer: Self-pay

## 2021-01-05 ENCOUNTER — Encounter: Payer: Self-pay | Admitting: Obstetrics

## 2021-01-05 ENCOUNTER — Encounter (HOSPITAL_BASED_OUTPATIENT_CLINIC_OR_DEPARTMENT_OTHER): Payer: Medicaid Other | Admitting: Internal Medicine

## 2021-01-05 DIAGNOSIS — L97519 Non-pressure chronic ulcer of other part of right foot with unspecified severity: Secondary | ICD-10-CM

## 2021-01-05 DIAGNOSIS — N939 Abnormal uterine and vaginal bleeding, unspecified: Secondary | ICD-10-CM | POA: Diagnosis not present

## 2021-01-05 DIAGNOSIS — E11621 Type 2 diabetes mellitus with foot ulcer: Secondary | ICD-10-CM | POA: Diagnosis not present

## 2021-01-05 NOTE — Progress Notes (Signed)
Patient presents for 2 week follow for u/s results. Patient identified with two patient identifiers. Patient has no other concerns.

## 2021-01-05 NOTE — Progress Notes (Signed)
INAS, KORHONEN ([id number]) Visit Report for 01/05/2021 Chief Complaint Document Details Patient Name: Date of Service: [name]. 01/05/2021 1:30 PM Medical Record Number: [id number] Patient Account Number: [id number] Date of Birth/Sex: Treating RN: 1971/05/21 (50 y.o. [name], [name] Primary Care Provider: [name] Other Clinician: Referring Provider: Treating Provider/Extender: [name] in Treatment: 6 Information Obtained from: Patient Chief Complaint Right foot wound Electronic Signature(s) Signed: 01/05/2021 2:50:48 PM By: Kalman Shan DO Entered By: Kalman Shan on 01/05/2021 14:31:22 -------------------------------------------------------------------------------- Debridement Details Patient Name: Date of Service: [name]. 01/05/2021 1:30 PM Medical Record Number: [id number] Patient Account Number: [id number] Date of Birth/Sex: Treating RN: 1971/02/12 (50 y.o. [name], [name] Primary Care Provider: [name] Other Clinician: Referring Provider: Treating Provider/Extender: [name] in Treatment: 6 Debridement Performed for Assessment: Wound #1 Right T Great oe Performed By: Physician Kalman Shan, DO Debridement Type: Debridement Severity of Tissue Pre Debridement: Fat layer exposed Level of Consciousness (Pre-procedure): Awake and Alert Pre-procedure Verification/Time Out Yes - 14:17 Taken: Start Time: 14:17 Pain Control: Lidocaine T Area Debrided (L x W): otal 0.4 (cm) x 0.3 (cm) = 0.12 (cm) Tissue and other material debrided: Viable, Non-Viable, Callus, Subcutaneous, Skin: Dermis , Skin: Epidermis Level: Skin/Subcutaneous Tissue Debridement Description: Excisional Instrument: Curette Bleeding: Minimum Hemostasis Achieved: Pressure End Time: 14:18 Procedural Pain: 0 Post Procedural Pain: 0 Response to Treatment: Procedure was  tolerated well Level of Consciousness (Post- Awake and Alert procedure): Post Debridement Measurements of Total Wound Length: (cm) 0.4 Width: (cm) 0.3 Depth: (cm) 0.1 Volume: (cm) 0.009 Character of Wound/Ulcer Post Debridement: Improved Severity of Tissue Post Debridement: Fat layer exposed Post Procedure Diagnosis Same as Pre-procedure Electronic Signature(s) Signed: 01/05/2021 2:50:48 PM By: Kalman Shan DO Signed: 01/05/2021 5:31:54 PM By: Rhae Hammock RN Entered By: Rhae Hammock on 01/05/2021 14:18:20 -------------------------------------------------------------------------------- HPI Details Patient Name: Date of Service: [name]. 01/05/2021 1:30 PM Medical Record Number: [id number] Patient Account Number: [id number] Date of Birth/Sex: Treating RN: 1970/09/11 (50 y.o. [name], [name] Primary Care Provider: [name] Other Clinician: Referring Provider: Treating Provider/Extender: [name] in Treatment: 6 History of Present Illness HPI Description: [name] is a 50 year old female with past medical history of insulin-dependent type 2 diabetes, previous left pontine CVA, essential hypertension that presents to the clinic today for evaluation of her right foot wound. She states that in December 2021 she had cut open her right great toe on a door step . It became infected and she was treated with antibiotics. She started following with podiatry in February and has been using Betadine daily on the wound. She is also using a surgical shoe to help with offloading. She has diabetic neuropathy and does not report pain to the wound. She denies any purulent drainage, fever/chills or increased warmth or erythema to the foot. 4/21; patient has been using silver alginate every other day with dressing changes. She denies any purulent drainage, fever/chills or increased warmth or erythema to the foot. She does  however notice an odor. 4/26; patient presents for 1 week follow-up. She continues to use silver alginate every other day without any issues. She denies purulent drainage, fever/chills or increased warmth or erythema to the foot. She does not notice an odor anymore. She has no complaints today. 5/3; patient presents for 1 week follow-up. She states that she has an MRI scheduled for Friday. She has been using silver alginate  every other day to the wound. She has noted more maceration. She denies any pain or acute signs of infection. 5/10; patient presents for 1 week follow-up. Her MRI had to be rescheduled due to needing a prior authorization. It is scheduled for 5/19. Patient has been using Hydrofera Blue every other day. She denies acute signs of infection. She has no complaints today. 5/17; patient presents for 1 week follow-up. She has an MRI scheduled on 5/19. She has been using silver alginate every other day to the wound. She states that after she took a shower she was able to remove part of the hardened callus to the tip of her toe. She denies signs of infection. 5/24; patient presents for 1 week follow-up. She had the MRI done. She is ready to do the cast today. She denies signs of infection. 5/27; patient presents for first cast change. She denies any issues for the past couple days while using the cast. She overall feels well. She denies signs of infection. 5/31; patient presents for cast change. She does have a small open wound to the anterior shin due to the cast Rubbing against it. Overall she feels well and denies signs of infection. Electronic Signature(s) Signed: 01/05/2021 2:50:48 PM By: Kalman Shan DO Entered By: Kalman Shan on 01/05/2021 14:32:37 -------------------------------------------------------------------------------- Physical Exam Details Patient Name: Date of Service: [name]. 01/05/2021 1:30 PM Medical Record Number: [id number] Patient Account  Number: [id number] Date of Birth/Sex: Treating RN: [birth year] (50 y.o. [name], [name] Primary Care Provider: [name] Other Clinician: Referring Provider: Treating Provider/Extender: [name] in Treatment: 6 Constitutional respirations regular, non-labored and within target range for patient.Marland Kitchen Psychiatric pleasant and cooperative. Notes Right great toe: Wound on the plantar first T with non viable tissue and granulation tissue present. Circumferential callus. No signs of infection. oe Right lower extremity: T the anterior shin there is a small abrasion limited to skin breakdown with no signs of infection. o Electronic Signature(s) Signed: 01/05/2021 2:50:48 PM By: Kalman Shan DO Entered By: Kalman Shan on 01/05/2021 14:49:34 -------------------------------------------------------------------------------- Physician Orders Details Patient Name: Date of Service: [name]. 01/05/2021 1:30 PM Medical Record Number: [id number] Patient Account Number: [id number] Date of Birth/Sex: Treating RN: [birth year] (50 y.o. 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 - Tuesday with Dr. Heber [place] Return A ***Extra time for TCC: 60 minutes!!**** Bathing/ Shower/ Hygiene May shower and wash wound with soap and water. - on days when dressing is changed. Edema Control - Lymphedema / SCD / Other Elevate legs to the level of the heart or above for 30 minutes daily  and/or when sitting, a frequency of: - throughout the day. Avoid standing for long periods of time. Off-Loading Total Contact Cast to Right Lower Extremity Non Wound Condition Protect area with: - Protect area at top of right leg with hydraferablue and foam before applying TCC Wound Treatment Wound #1 - T Great oe Wound Laterality: Right Cleanser: Wound Cleanser Every Other Day/15 Days Discharge Instructions: Cleanse the wound with wound cleanser prior to applying a clean dressing using gauze sponges, not tissue or cotton balls. Prim  Dressing: KerraCel Ag Gelling Fiber Dressing, 2x2 in (silver alginate) Every Other Day/15 Days ary Discharge Instructions: Apply silver alginate to wound bed as instructed Secondary Dressing: Woven Gauze Sponges 2x2 in (Generic) Every Other Day/15 Days Discharge Instructions: Apply over primary dressing as directed. Secondary Dressing: Zetuvit Plus 4x8 in Every Other Day/15 Days Discharge Instructions: Apply over primary dressing as directed. Secondary Dressing: Optifoam Non-Adhesive Dressing, 4x4 in Every Other Day/15 Days Discharge Instructions: ***Apply foam donut*** Apply over primary dressing as directed. Secured With: Child psychotherapist, Sterile 2x75 (in/in) (Generic) Every Other Day/15 Days Discharge Instructions: Secure with stretch gauze as directed. Secured With: Paper Tape, 2x10 (in/yd) (Generic) Every Other Day/15 Days Discharge Instructions: Secure dressing with tape as directed. Electronic Signature(s) Signed: 01/05/2021 2:50:48 PM By: Kalman Shan DO Entered By: Kalman Shan on 01/05/2021 14:49:47 -------------------------------------------------------------------------------- Problem List Details Patient Name: Date of Service: [name]. 01/05/2021 1:30 PM Medical Record Number: [id number] Patient Account Number: [id number] Date of Birth/Sex: Treating RN: September 18, 1970 (50 y.o. 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 01/05/2021 1:30 PM Medical Record Number: [id number] Patient Account Number: [id number] Date of Birth/Sex: Treating RN: 10-21-1970 (50 y.o. [name], [name] Primary Care Provider: [name] Other Clinician: Referring Provider: Treating Provider/Extender: [name] in Treatment: 6 Subjective Chief Complaint Information obtained from Patient Right foot wound History of Present Illness (HPI) [name] is a 50 year old female with past medical history of insulin-dependent type 2 diabetes, previous left pontine CVA, essential hypertension that presents to the clinic today for evaluation of her right foot wound. She states that in December 2021 she had cut open her right great toe on a door step . It became infected and she was treated with antibiotics. She started following with podiatry in February and has been using Betadine daily on the wound. She is also using a surgical shoe to help with offloading. She has diabetic neuropathy and does not report pain to the wound. She denies any purulent drainage, fever/chills  or increased warmth or erythema to the foot. 4/21; patient has been using silver alginate every other day with dressing changes. She denies any purulent drainage, fever/chills or increased warmth or erythema to the foot. She does however notice an odor. 4/26; patient presents for 1 week follow-up. She continues to use silver alginate every other day without any issues. She denies purulent drainage, fever/chills or increased warmth or erythema to the foot. She does not notice an odor anymore. She has no complaints today. 5/3; patient presents for 1 week follow-up. She states that she has an MRI scheduled for Friday. She has been using silver alginate every other day to the wound. She has noted more maceration. She denies any pain or acute signs of infection. 5/10; patient presents for 1 week follow-up. Her MRI had to be rescheduled due to needing a prior authorization. It  is scheduled for 5/19. Patient has been using Hydrofera Blue every other day. She denies acute signs of infection. She has no complaints today. 5/17; patient presents for 1 week follow-up. She has an MRI scheduled on 5/19. She has been using silver alginate every other day to the wound. She states that after she took a shower she was able to remove part of the hardened callus to the tip of her toe. She denies signs of infection. 5/24; patient presents for 1 week follow-up. She had the MRI done. She is ready to do the cast today. She denies signs of infection. 5/27; patient presents for first cast change. She denies any issues for the past couple days while using the cast. She overall feels well. She denies signs of infection. 5/31; patient presents for cast change. She does have a small open wound to the anterior shin due to the cast Rubbing against it. Overall she feels well and denies signs of infection. Patient History Information obtained from Patient. Family History Cancer - Maternal Grandparents, Diabetes -  Mother,Father,Maternal Grandparents,Paternal Grandparents, Heart Disease - Mother,Father,Maternal Grandparents,Paternal Grandparents,Siblings, Hypertension - Mother,Father, Kidney Disease - Maternal Grandparents, Stroke - Maternal Grandparents,Mother, No family history of Hereditary Spherocytosis, Lung Disease, Seizures, Thyroid Problems, Tuberculosis. Social History Former smoker - quit 3-4 yrs ago, Marital Status - Single, Alcohol Use - Rarely, Drug Use - Current History - marijuana, Caffeine Use - Rarely. Medical History Eyes Denies history of Cataracts, Glaucoma, Optic Neuritis Hematologic/Lymphatic Patient has history of Anemia Respiratory Patient has history of Asthma, Sleep Apnea - CPAP Cardiovascular Patient has history of Coronary Artery Disease, Hypertension Endocrine Patient has history of Type II Diabetes Denies history of Type I Diabetes Genitourinary Denies history of End Stage Renal Disease Integumentary (Skin) Denies history of History of Burn Musculoskeletal Patient has history of Osteoarthritis Neurologic Patient has history of Neuropathy Oncologic Denies history of Received Chemotherapy, Received Radiation Psychiatric Patient has history of Confinement Anxiety Denies history of Anorexia/bulimia Hospitalization/Surgery History - achilles tendon rupture right. Medical A Surgical History Notes nd Constitutional Symptoms (General Health) vitamin D deficiency Eyes hypertensive retinopathy Cardiovascular hyperlipidemia Endocrine left thyroid nodule Genitourinary CKD Neurologic stroke Objective Constitutional respirations regular, non-labored and within target range for patient.. Vitals Time Taken: 2:00 PM, Height: 69 in, Weight: 250 lbs, BMI: 36.9, Temperature: 98.7 F, Pulse: 83 bpm, Respiratory Rate: 20 breaths/min, Blood Pressure: 159/90 mmHg. Psychiatric pleasant and cooperative. General Notes: Right great toe: Wound on the plantar first T with  non viable tissue and granulation tissue present. Circumferential callus. No signs of oe infection. Right lower extremity: T the anterior shin there is a small abrasion limited to skin breakdown with no signs of infection. o Integumentary (Hair, Skin) Wound #1 status is Open. Original cause of wound was Puncture. The date acquired was: 07/09/2020. The wound has been in treatment 6 weeks. The wound is located on the Right T Great. The wound measures 0.4cm length x 0.3cm width x 0.1cm depth; 0.094cm^2 area and 0.009cm^3 volume. There is Fat Layer oe (Subcutaneous Tissue) exposed. There is no tunneling or undermining noted. There is a medium amount of serosanguineous drainage noted. The wound margin is thickened. There is large (67-100%) red granulation within the wound bed. There is no necrotic tissue within the wound bed. 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 We will continue with Silver alginate under the total contact cast. Nonviable tissue and callus was debrided today. No signs of infection. She does have a small little abrasion at the anterior shin and I will put Hydrofera Blue to this along with extra padding. I will see her in 1 week Procedures Wound #1 Pre-procedure diagnosis of Wound #1 is a Diabetic Wound/Ulcer of the Lower Extremity located on the Right T Great .Severity of Tissue Pre Debridement is: oe Fat layer exposed. There was a Excisional Skin/Subcutaneous Tissue Debridement with a total area of 0.12 sq cm performed by Kalman Shan, DO. With the following instrument(s): Curette to remove Viable and Non-Viable tissue/material. Material removed includes Callus, Subcutaneous Tissue, Skin: Dermis, and Skin: Epidermis after  achieving pain control using Lidocaine. No specimens were taken. A time out was conducted at 14:17, prior to the start of the procedure. A Minimum amount of bleeding was controlled with Pressure. The procedure was tolerated well with a pain level of 0 throughout and a pain level of 0 following the procedure. Post Debridement Measurements: 0.4cm length x 0.3cm width x 0.1cm depth; 0.009cm^3 volume. Character of Wound/Ulcer Post Debridement is improved. Severity of Tissue Post Debridement is: Fat layer exposed. Post procedure Diagnosis Wound #1: Same as Pre-Procedure Pre-procedure diagnosis of Wound #1 is a Diabetic Wound/Ulcer of the Lower Extremity located on the Right T Great . There was a T Contact Cast oe otal Procedure by Kalman Shan, DO. Post procedure Diagnosis Wound #1: Same as Pre-Procedure Plan Follow-up Appointments: Return Appointment in 1 week. - Tuesday with Dr. Heber [place] ***Extra time for TCC: 60 minutes!!**** Bathing/ Shower/ Hygiene: May shower and wash wound with soap and water. - on days when dressing is changed. Edema Control - Lymphedema / SCD / Other: Elevate legs to the level of the heart or above for 30 minutes daily and/or when sitting, a frequency of: - throughout the day. Avoid standing for long periods of time. Off-Loading: T Contact Cast to Right Lower Extremity otal Non Wound Condition: Protect area with: - Protect area at top of right leg with hydraferablue and foam before applying TCC WOUND #1: - T Great Wound Laterality: Right oe Cleanser: Wound Cleanser Every Other Day/15 Days Discharge Instructions: Cleanse the wound with wound cleanser prior to applying a clean dressing using gauze sponges, not tissue or cotton balls. Prim Dressing: KerraCel Ag Gelling Fiber Dressing, 2x2 in (silver alginate) Every Other Day/15 Days ary Discharge Instructions: Apply silver alginate to wound bed as instructed Secondary Dressing: Woven Gauze Sponges 2x2 in (Generic)  Every Other Day/15 Days Discharge Instructions: Apply over primary dressing as directed. Secondary Dressing: Zetuvit Plus 4x8 in Every Other Day/15 Days Discharge Instructions: Apply over primary dressing as directed. Secondary Dressing: Optifoam Non-Adhesive Dressing, 4x4 in Every Other Day/15 Days Discharge Instructions: ***Apply foam donut*** Apply over primary dressing as directed. Secured With: Child psychotherapist, Sterile 2x75 (in/in) (Generic) Every Other Day/15 Days Discharge Instructions: Secure with stretch gauze as directed. Secured With: Paper T ape, 2x10 (in/yd) (Generic) Every Other Day/15 Days Discharge Instructions: Secure dressing with tape as directed. 1. In office sharp debridement 2. Silver alginate under total contact cast 3. Hydrofera Blue to small abrasion on the anterior shin 4. Follow-up in 1 week Electronic Signature(s) Signed: 01/05/2021 2:50:48 PM By: Kalman Shan DO Entered By: Kalman Shan on 01/05/2021 14:49:59 -------------------------------------------------------------------------------- HxROS Details Patient Name: Date of Service: [name]. 01/05/2021 1:30 PM Medical Record Number: [id number] Patient Account  Number: [id number] Date of Birth/Sex: Treating RN: Feb 21, 1971 (50 y.o. [name], [name] Primary Care Provider: [name] Other Clinician: Referring Provider: Treating Provider/Extender: [name] in Treatment: 6 Information Obtained From Patient Constitutional Symptoms (General Health) Medical History: Past Medical History Notes: vitamin D deficiency Eyes Medical History: Negative for: Cataracts; Glaucoma; Optic Neuritis Past Medical History Notes: hypertensive retinopathy Hematologic/Lymphatic Medical History: Positive for: Anemia Respiratory Medical History: Positive for: Asthma; Sleep Apnea - CPAP Cardiovascular Medical History: Positive for: Coronary  Artery Disease; Hypertension Past Medical History Notes: hyperlipidemia Endocrine Medical History: Positive for: Type II Diabetes Negative for: Type I Diabetes Past Medical History Notes: left thyroid nodule Time with diabetes: since age 89 Blood sugar tested every day: Yes Tested : 2-3 times per day Genitourinary Medical History: Negative for: End Stage Renal Disease Past Medical History Notes: CKD Integumentary (Skin) Medical History: Negative for: History of Burn Musculoskeletal Medical History: Positive for: Osteoarthritis Neurologic Medical History: Positive for: Neuropathy Past Medical History Notes: stroke Oncologic Medical History: Negative for: Received Chemotherapy; Received Radiation Psychiatric Medical History: Positive for: Confinement Anxiety Negative for: Anorexia/bulimia Immunizations Pneumococcal Vaccine: Received Pneumococcal Vaccination: No Implantable Devices No devices added Hospitalization / Surgery History Type of Hospitalization/Surgery achilles tendon rupture right Family and Social History Cancer: Yes - Maternal Grandparents; Diabetes: Yes - Mother,Father,Maternal Grandparents,Paternal Grandparents; Heart Disease: Yes - Mother,Father,Maternal Grandparents,Paternal Grandparents,Siblings; Hereditary Spherocytosis: No; Hypertension: Yes - Mother,Father; Kidney Disease: Yes - Maternal Grandparents; Lung Disease: No; Seizures: No; Stroke: Yes - Maternal Grandparents,Mother; Thyroid Problems: No; Tuberculosis: No; Former smoker - quit 3-4 yrs ago; Marital Status - Single; Alcohol Use: Rarely; Drug Use: Current History - marijuana; Caffeine Use: Rarely; Financial Concerns: No; Food, Clothing or Shelter Needs: No; Support System Lacking: No; Transportation Concerns: No Electronic Signature(s) Signed: 01/05/2021 2:50:48 PM By: Kalman Shan DO Signed: 01/05/2021 5:31:54 PM By: Rhae Hammock RN Entered By: Kalman Shan on 01/05/2021  14:32:46 -------------------------------------------------------------------------------- Total Contact Cast Details Patient Name: Date of Service: Charlton Haws NDA [name]. 01/05/2021 1:30 PM Medical Record Number: [id number] Patient Account Number: [id number] Date of Birth/Sex: Treating RN: 1971/05/21 (50 y.o. [name], [name] Primary Care Provider: [name] Other Clinician: Referring Provider: Treating Provider/Extender: [name] in Treatment: 6 T Contact Cast Applied for Wound Assessment: otal Wound #1 Right T Great oe Performed By: Physician Kalman Shan, DO Post Procedure Diagnosis Same as Pre-procedure Electronic Signature(s) Signed: 01/05/2021 2:50:48 PM By: Kalman Shan DO Signed: 01/05/2021 5:31:54 PM By: Rhae Hammock RN Entered By: Rhae Hammock on 01/05/2021 14:18:46 -------------------------------------------------------------------------------- SuperBill Details Patient Name: Date of Service: [name]. 01/05/2021 Medical Record Number: [id number] Patient Account Number: [id number] Date of Birth/Sex: Treating RN: [birth year] (49 y.o. 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

## 2021-01-05 NOTE — Progress Notes (Signed)
[name], [name] ([id number]) Visit Report for 01/05/2021 Arrival Information Details Patient Name: Date of Service: [name] 01/05/2021 1:30 PM Medical Record Number: [id number] Patient Account Number: [id number] Date of Birth/Sex: Treating RN: [birth year] (50 y.o. [name], [name] Primary Care Shelvia Fojtik: [name] Other Clinician: Referring Henchy Mccauley: Treating Ajwa Kimberley/Extender: [name] in Treatment: 6 Visit Information History Since Last Visit Added or deleted any medications: No Patient Arrived: Ambulatory Any new allergies or adverse reactions: No Arrival Time: 14:00 Had a fall or experienced change in No Accompanied By: self activities of daily living that may affect Transfer Assistance: None risk of falls: Patient Identification Verified: Yes Signs or symptoms of abuse/neglect since last visito No Secondary Verification Process Completed: Yes Hospitalized since last visit: No Patient Requires Transmission-Based Precautions: No Implantable device outside of the clinic excluding No Patient Has Alerts: No cellular tissue based products placed in the center since last visit: Has Dressing in Place as Prescribed: Yes Pain Present Now: No Electronic Signature(s) Signed: 01/05/2021 5:24:49 PM By: Sandre Kitty Entered By: Sandre Kitty on 01/05/2021 14:00:51 -------------------------------------------------------------------------------- Encounter Discharge Information Details Patient Name: Date of Service: [name] 01/05/2021 1:30 PM Medical Record Number: [id number] Patient Account Number: [id number] Date of Birth/Sex: Treating RN: [birth year] (50 y.o. [name] Primary Care Omaree Fuqua: [name] Other Clinician: Referring Terrill Alperin: Treating Evella Kasal/Extender: [name] in Treatment: 6 Encounter Discharge Information Items Post Procedure Vitals Discharge  Condition: Stable Temperature (F): 98.7 Ambulatory Status: Ambulatory Pulse (bpm): 83 Discharge Destination: Home Respiratory Rate (breaths/min): 20 Transportation: Private Auto Blood Pressure (mmHg): 159/90 Schedule Follow-up Appointment: Yes Clinical Summary of Care: Provided on 01/05/2021 Form Type Recipient Paper Patient Patient Electronic Signature(s) Signed: 01/05/2021 2:21:34 PM By: Lorrin Jackson Previous Signature: 01/05/2021 2:18:30 PM Version By: Lorrin Jackson Entered By: Lorrin Jackson on 01/05/2021 14:21:34 -------------------------------------------------------------------------------- Lower Extremity Assessment Details Patient Name: Date of Service: [name] 01/05/2021 1:30 PM Medical Record Number: [id number] Patient Account Number: [id number] Date of Birth/Sex: Treating RN: [birth year] (50 y.o. [name], [name] Primary Care Yvone Slape: [name] Other Clinician: Referring Sarahjane Matherly: Treating Marijayne Rauth/Extender: [name] in Treatment: 6 Edema Assessment Assessed: [name]: No] [Right: Yes] Edema: [Left: N] [Right: o] Calf Left: Right: Point of Measurement: From Medial Instep 37 cm Ankle Left: Right: Point of Measurement: From Medial Instep 23.5 cm Vascular Assessment Pulses: Dorsalis Pedis Palpable: [Right:Yes] Posterior Tibial Palpable: [Right:Yes] Electronic Signature(s) Signed: 01/05/2021 5:31:54 PM By: Rhae Hammock RN Entered By: Rhae Hammock on 01/05/2021 14:07:22 -------------------------------------------------------------------------------- Multi Wound Chart Details Patient Name: Date of Service: [name] 01/05/2021 1:30 PM Medical Record Number: [id number] Patient Account Number: [id number] Date of Birth/Sex: Treating RN: Apr 29, 1971 (50 y.o. [name], [name] Primary Care Noelle Hoogland: [name] Other Clinician: Referring Clarkson Rosselli: Treating Nakkia Mackiewicz/Extender:  [name] in Treatment: 6 Vital Signs Height(in): 33 Pulse(bpm): 66 Weight(lbs): 250 Blood Pressure(mmHg): 159/90 Body Mass Index(BMI): 37 Temperature(F): 98.7 Respiratory Rate(breaths/min): 20 Photos: [1:No Photos Right T Great oe] [N/A:N/A N/A] Wound Location: [1:Puncture] [N/A:N/A] Wounding Event: [1:Diabetic Wound/Ulcer of the Lower] [N/A:N/A] Primary Etiology: [1:Extremity Anemia, Asthma, Sleep Apnea,] [N/A:N/A] Comorbid History: [1:Coronary Artery Disease, Hypertension, Type II Diabetes, Osteoarthritis, Neuropathy, Confinement Anxiety 07/09/2020] [N/A:N/A] Date Acquired: [1:6] [N/A:N/A] Weeks of Treatment: [1:Open] [N/A:N/A] Wound Status: [1:0.4x0.3x0.1] [N/A:N/A] Measurements L x W x D (cm) [1:0.094] [N/A:N/A] A (cm) : rea [1:0.009] [N/A:N/A] Volume (cm) : [1:25.40%] [N/A:N/A] % Reduction in A [1:rea: 89.80%] [N/A:N/A] %  Reduction in Volume: [1:Grade 2] [N/A:N/A] Classification: [1:Medium] [N/A:N/A] Exudate A mount: [1:Serosanguineous] [N/A:N/A] Exudate Type: [1:red, brown] [N/A:N/A] Exudate Color: [1:Thickened] [N/A:N/A] Wound Margin: [1:Large (67-100%)] [N/A:N/A] Granulation A mount: [1:Red] [N/A:N/A] Granulation Quality: [1:None Present (0%)] [N/A:N/A] Necrotic A mount: [1:Fat Layer (Subcutaneous Tissue): Yes N/A] Exposed Structures: [1:Fascia: No Tendon: No Muscle: No Joint: No Bone: No Small (1-33%)] [N/A:N/A] Epithelialization: [1:Debridement - Excisional] [N/A:N/A] Debridement: Pre-procedure Verification/Time Out 14:17 [N/A:N/A] Taken: [1:Lidocaine] [N/A:N/A] Pain Control: [1:Callus, Subcutaneous] [N/A:N/A] Tissue Debrided: [1:Skin/Subcutaneous Tissue] [N/A:N/A] Level: [1:0.12] [N/A:N/A] Debridement A (sq cm): [1:rea Curette] [N/A:N/A] Instrument: [1:Minimum] [N/A:N/A] Bleeding: [1:Pressure] [N/A:N/A] Hemostasis A chieved: [1:0] [N/A:N/A] Procedural Pain: [1:0] [N/A:N/A] Post Procedural Pain: [1:Procedure was tolerated  well] [N/A:N/A] Debridement Treatment Response: [1:0.4x0.3x0.1] [N/A:N/A] Post Debridement Measurements L x W x D (cm) [1:0.009] [N/A:N/A] Post Debridement Volume: (cm) [1:Debridement] [N/A:N/A] Procedures Performed: [1:T Contact Cast otal] Treatment Notes Wound #1 (Toe Great) Wound Laterality: Right Cleanser Wound Cleanser Discharge Instruction: Cleanse the wound with wound cleanser prior to applying a clean dressing using gauze sponges, not tissue or cotton balls. Peri-Wound Care Topical Primary Dressing KerraCel Ag Gelling Fiber Dressing, 2x2 in (silver alginate) Discharge Instruction: Apply silver alginate to wound bed as instructed Secondary Dressing Zetuvit Plus 4x8 in Discharge Instruction: Apply over primary dressing as directed. Woven Gauze Sponges 2x2 in Discharge Instruction: Apply over primary dressing as directed. Optifoam Non-Adhesive Dressing, 4x4 in Discharge Instruction: ***Apply foam donut*** Apply over primary dressing as directed. Secured With Conforming Stretch Gauze Bandage, Sterile 2x75 (in/in) Discharge Instruction: Secure with stretch gauze as directed. Paper Tape, 2x10 (in/yd) Discharge Instruction: Secure dressing with tape as directed. Compression Wrap Compression Stockings Add-Ons Electronic Signature(s) Signed: 01/05/2021 2:50:48 PM By: Kalman Shan DO Signed: 01/05/2021 5:31:54 PM By: Rhae Hammock RN Entered By: Kalman Shan on 01/05/2021 14:31:16 -------------------------------------------------------------------------------- Multi-Disciplinary Care Plan Details Patient Name: Date of Service: [name] 01/05/2021 1:30 PM Medical Record Number: [id number] Patient Account Number: [id number] Date of Birth/Sex: Treating RN: September 18, 1970 (50 y.o. [name], [name] Primary Care Chandlor Noecker: [name] Other Clinician: Referring Treyvonne Tata: Treating Severa Jeremiah/Extender: [name] in  Treatment: 6 Multidisciplinary Care Plan reviewed with physician Active Inactive Nutrition Nursing Diagnoses: Impaired glucose control: actual or potential Potential for alteratiion in Nutrition/Potential for imbalanced nutrition Goals: Patient/caregiver will maintain therapeutic glucose control Date Initiated: 11/19/2020 Target Resolution Date: 01/09/2021 Goal Status: Active Interventions: Assess patient nutrition upon admission and as needed per policy Provide education on elevated blood sugars and impact on wound healing Provide education on nutrition Treatment Activities: Education provided on Nutrition : 12/22/2020 Patient referred to Primary Care Physician for further nutritional evaluation : 11/19/2020 Notes: Wound/Skin Impairment Nursing Diagnoses: Impaired tissue integrity Knowledge deficit related to ulceration/compromised skin integrity Goals: Patient/caregiver will verbalize understanding of skin care regimen Date Initiated: 11/19/2020 Date Inactivated: 12/08/2020 Target Resolution Date: 12/17/2020 Goal Status: Met Ulcer/skin breakdown will have a volume reduction of 30% by week 4 Date Initiated: 11/19/2020 Target Resolution Date: 01/09/2021 Goal Status: Active Interventions: Assess patient/caregiver ability to obtain necessary supplies Assess patient/caregiver ability to perform ulcer/skin care regimen upon admission and as needed Assess ulceration(s) every visit Provide education on ulcer and skin care Treatment Activities: Skin care regimen initiated : 11/19/2020 Topical wound management initiated : 11/19/2020 Notes: Electronic Signature(s) Signed: 01/05/2021 5:31:54 PM By: Rhae Hammock RN Entered By: Rhae Hammock on 01/05/2021 14:20:20 -------------------------------------------------------------------------------- Pain Assessment Details Patient Name: Date of Service: [name] 01/05/2021 1:30 PM Medical Record Number: [id number] Patient Account  Number: [id number] Date of Birth/Sex: Treating RN: Jul 15, 1971 (50 y.o. [name], [name] Primary Care Meegan Shanafelt: [name] Other Clinician: Referring Milayna Rotenberg: Treating Tryston Gilliam/Extender: [name] in Treatment: 6 Active Problems Location of Pain Severity and Description of Pain Patient Has Paino No Site Locations Pain Management and Medication Current Pain Management: Electronic Signature(s) Signed: 01/05/2021 5:24:49 PM By: Sandre Kitty Signed: 01/05/2021 5:31:54 PM By: Rhae Hammock RN Entered By: Sandre Kitty on 01/05/2021 14:01:21 -------------------------------------------------------------------------------- Patient/Caregiver Education Details Patient Name: Date of Service: [name] 5/31/2022andnbsp1:30 PM Medical Record Number: [id number] Patient Account Number: [id number] Date of Birth/Gender: Treating RN: 11/27/1970 (50 y.o. [name], [name] Primary Care Physician: [name] Other Clinician: Referring Physician: Treating Physician/Extender: [name] in Treatment: 6 Education Assessment Education Provided To: Patient Education Topics Provided Nutrition: Methods: Explain/Verbal Electronic Signature(s) Signed: 01/05/2021 5:31:54 PM By: Rhae Hammock RN Entered By: Rhae Hammock on 01/05/2021 14:20:34 -------------------------------------------------------------------------------- Wound Assessment Details Patient Name: Date of Service: [name] 01/05/2021 1:30 PM Medical Record Number: [id number] Patient Account Number: [id number] Date of Birth/Sex: Treating RN: 1970/09/03 (50 y.o. [name], [name] Primary Care Ellice Boultinghouse: [name] Other Clinician: Referring Kellianne Ek: Treating Ananias Kolander/Extender: [name] in Treatment: 6 Wound Status Wound Number: 1 Primary Diabetic Wound/Ulcer of the  Lower Extremity Etiology: Wound Location: Right T Great oe Wound Open Wounding Event: Puncture Status: Date Acquired: 07/09/2020 Comorbid Anemia, Asthma, Sleep Apnea, Coronary Artery Disease, Weeks Of Treatment: 6 History: Hypertension, Type II Diabetes, Osteoarthritis, Neuropathy, Clustered Wound: No Confinement Anxiety Photos Wound Measurements Length: (cm) 0.4 Width: (cm) 0.3 Depth: (cm) 0.1 Area: (cm) 0.094 Volume: (cm) 0.009 % Reduction in Area: 25.4% % Reduction in Volume: 89.8% Epithelialization: Small (1-33%) Tunneling: No Undermining: No Wound Description Classification: Grade 2 Wound Margin: Thickened Exudate Amount: Medium Exudate Type: Serosanguineous Exudate Color: red, brown Foul Odor After Cleansing: No Slough/Fibrino Yes Wound Bed Granulation Amount: Large (67-100%) Exposed Structure Granulation Quality: Red Fascia Exposed: No Necrotic Amount: None Present (0%) Fat Layer (Subcutaneous Tissue) Exposed: Yes Tendon Exposed: No Muscle Exposed: No Joint Exposed: No Bone Exposed: No Treatment Notes Wound #1 (Toe Great) Wound Laterality: Right Cleanser Wound Cleanser Discharge Instruction: Cleanse the wound with wound cleanser prior to applying a clean dressing using gauze sponges, not tissue or cotton balls. Peri-Wound Care Topical Primary Dressing KerraCel Ag Gelling Fiber Dressing, 2x2 in (silver alginate) Discharge Instruction: Apply silver alginate to wound bed as instructed Secondary Dressing Zetuvit Plus 4x8 in Discharge Instruction: Apply over primary dressing as directed. Woven Gauze Sponges 2x2 in Discharge Instruction: Apply over primary dressing as directed. Optifoam Non-Adhesive Dressing, 4x4 in Discharge Instruction: ***Apply foam donut*** Apply over primary dressing as directed. Secured With Conforming Stretch Gauze Bandage, Sterile 2x75 (in/in) Discharge Instruction: Secure with stretch gauze as directed. Paper Tape, 2x10  (in/yd) Discharge Instruction: Secure dressing with tape as directed. Compression Wrap Compression Stockings Add-Ons Electronic Signature(s) Signed: 01/05/2021 5:24:49 PM By: Sandre Kitty Signed: 01/05/2021 5:31:54 PM By: Rhae Hammock RN Entered By: Sandre Kitty on 01/05/2021 17:01:34 -------------------------------------------------------------------------------- Vitals Details Patient Name: Date of Service: [name] 01/05/2021 1:30 PM Medical Record Number: [id number] Patient Account Number: [id number] Date of Birth/Sex: Treating RN: Mar 07, 1971 (50 y.o. [name], [name] Primary Care Jagar Lua: [name] Other Clinician: Referring Inda Mcglothen: Treating Alexey Rhoads/Extender: [name] in Treatment: 6 Vital Signs Time Taken: 14:00 Temperature (F): 98.7 Height (in): 69 Pulse (bpm): 83 Weight (lbs): 250 Respiratory Rate (  breaths/min): 20 Body Mass Index (BMI): 36.9 Blood Pressure (mmHg): 159/90 Reference Range: 80 - 120 mg / dl Electronic Signature(s) Signed: 01/05/2021 5:24:49 PM By: Sandre Kitty Entered By: Sandre Kitty on 01/05/2021 14:01:13

## 2021-01-05 NOTE — Progress Notes (Signed)
GYNECOLOGY VIRTUAL VISIT ENCOUNTER NOTE  Provider location: [facility]   Patient location: Home  I connected with [name] on 01/05/21 at  8:45 AM EDT by MyChart Video Encounter and verified that I am speaking with the correct person using two identifiers.   I discussed the limitations, risks, security and privacy concerns of performing an evaluation and management service virtually and the availability of in person appointments. I also discussed with the patient that there may be a patient responsible charge related to this service. The patient expressed understanding and agreed to proceed.   History:  [name] is a 50 y.o. [phone] female being evaluated today for AUB.  Ultrasound done.  Presents today for ultrasound results and management recommendations. She denies any abnormal vaginal discharge, bleeding, pelvic pain or other concerns.       Past Medical History:  Diagnosis Date  . Arthritis   . Asthma   . Cataract    Mixed form OU  . CKD (chronic kidney disease)   . Coronary artery disease   . Diabetes mellitus   . Diabetic retinopathy ([place])    NPDR OU  . Hyperlipidemia   . Hypertension   . Hypertensive retinopathy    OU  . Left thyroid nodule    diagnosed 07/2018  . Pseudotumor cerebri   . Stroke ([place])   . Vitamin D deficiency    Past Surgical History:  Procedure Laterality Date  . ACHILLES TENDON REPAIR    . TUBAL LIGATION     The following portions of the patient's history were reviewed and updated as appropriate: allergies, current medications, past family history, past medical history, past social history, past surgical history and problem list.   Health Maintenance:  Normal pap and negative HRHPV on 12-12-2019.  Normal mammogram on 05-03-2020.   Review of Systems:  Pertinent items noted in HPI and remainder of comprehensive ROS otherwise negative.  Physical Exam:   General:  Alert, oriented and cooperative.  Patient appears to be in no acute distress.  Mental Status: Normal mood and affect. Normal behavior. Normal judgment and thought content.   Respiratory: Normal respiratory effort, no problems with respiration noted  Rest of physical exam deferred due to type of encounter  Labs and Imaging No results found for this or any previous visit (from the past 336 hour(s)). MR FOOT RIGHT W WO CONTRAST  Result Date: 12/25/2020 CLINICAL DATA:  Open wound on the great toe of the right foot since December, 2021. Question osteomyelitis. EXAM: MRI OF THE RIGHT FOREFOOT WITHOUT AND WITH CONTRAST TECHNIQUE: Multiplanar, multisequence MR imaging of the right forefoot was performed before and after the administration of intravenous contrast. CONTRAST:  10 mL GADAVIST IV SOLN COMPARISON:  Plain films right foot 10/09/2020. FINDINGS: Bones/Joint/Cartilage There is no marrow edema or enhancement to suggest osteomyelitis. Small subchondral cyst in the plantar surface of the head of the first metatarsal is incidentally noted. No joint effusion. No fracture, stress change or worrisome lesion. Ligaments Intact. Muscles and Tendons Intact. Negative for intramuscular fluid collection. Mild fatty atrophy of intrinsic musculature the foot noted. Soft tissues No abscess. Skin wound on the plantar surface of the great toe noted. IMPRESSION: Skin ulceration on the great toe without abscess, osteomyelitis or septic joint. Mild first MTP osteoarthritis. Electronically Signed   By: Inge Rise M.D.   On: 12/25/2020 11:12   US PELVIC COMPLETE WITH TRANSVAGINAL  Result Date: 12/28/2020 CLINICAL DATA:  Abnormal  uterine bleeding, LMP 12/04/2020 EXAM: TRANSABDOMINAL AND TRANSVAGINAL ULTRASOUND OF PELVIS TECHNIQUE: Both transabdominal and transvaginal ultrasound examinations of the pelvis were performed. Transabdominal technique was performed for global imaging of the pelvis including uterus, ovaries, adnexal regions, and pelvic cul-de-sac.  It was necessary to proceed with endovaginal exam following the transabdominal exam to visualize the endometrium and ovaries. COMPARISON:  04/06/2020 FINDINGS: Uterus Measurements: 10.5 x 4.9 x 5.1 cm = volume: 139 mL. Anteverted. Normal morphology without mass. Incidentally noted small nabothian cysts at cervix. Endometrium Thickness: 10 mm.  No endometrial fluid or focal abnormalities Right ovary Measurements: 2.1 x 1.5 x 1.7 cm = volume: 2.7 mL. Normal morphology without mass Left ovary Measurements: 4.1 x 2.3 x 4.0 cm = volume: 19.7 mL. Normal morphology without mass Other findings No free pelvic fluid. 6 mm RIGHT paraovarian cyst; no follow-up imaging recommended. No additional pelvic masses. IMPRESSION: No pelvic sonographic abnormalities. Electronically Signed   By: Lavonia Dana M.D.   On: 12/28/2020 14:34       Assessment and Plan:     1. Abnormal uterine bleeding (AUB) - follow up in 3 months       I discussed the assessment and treatment plan with the patient. The patient was provided an opportunity to ask questions and all were answered. The patient agreed with the plan and demonstrated an understanding of the instructions.   The patient was advised to call back or seek an in-person evaluation/go to the ED if the symptoms worsen or if the condition fails to improve as anticipated.  I have spent a total of 10 minutes of face-to-face time, excluding clinical staff time, reviewing notes and preparing to see patient, ordering tests and/or medications, and counseling the patient.    Baltazar Najjar, MD [facility] 01/05/21

## 2021-01-12 ENCOUNTER — Other Ambulatory Visit: Payer: Self-pay

## 2021-01-12 ENCOUNTER — Emergency Department (HOSPITAL_COMMUNITY)
Admission: EM | Admit: 2021-01-12 | Discharge: 2021-01-12 | Disposition: A | Discharge status: Home / Self Care | Payer: Medicaid Other | Attending: Emergency Medicine | Admitting: Emergency Medicine

## 2021-01-12 ENCOUNTER — Encounter (HOSPITAL_COMMUNITY): Payer: Self-pay | Admitting: *Deleted

## 2021-01-12 ENCOUNTER — Encounter (HOSPITAL_BASED_OUTPATIENT_CLINIC_OR_DEPARTMENT_OTHER): Payer: Medicaid Other | Attending: Internal Medicine | Admitting: Internal Medicine

## 2021-01-12 ENCOUNTER — Emergency Department (HOSPITAL_COMMUNITY): Payer: Medicaid Other

## 2021-01-12 DIAGNOSIS — Z8673 Personal history of transient ischemic attack (TIA), and cerebral infarction without residual deficits: Secondary | ICD-10-CM | POA: Diagnosis not present

## 2021-01-12 DIAGNOSIS — Z8616 Personal history of COVID-19: Secondary | ICD-10-CM | POA: Diagnosis not present

## 2021-01-12 DIAGNOSIS — E1140 Type 2 diabetes mellitus with diabetic neuropathy, unspecified: Secondary | ICD-10-CM | POA: Diagnosis not present

## 2021-01-12 DIAGNOSIS — Z7982 Long term (current) use of aspirin: Secondary | ICD-10-CM | POA: Insufficient documentation

## 2021-01-12 DIAGNOSIS — I2510 Atherosclerotic heart disease of native coronary artery without angina pectoris: Secondary | ICD-10-CM | POA: Diagnosis not present

## 2021-01-12 DIAGNOSIS — J45909 Unspecified asthma, uncomplicated: Secondary | ICD-10-CM | POA: Insufficient documentation

## 2021-01-12 DIAGNOSIS — I1310 Hypertensive heart and chronic kidney disease without heart failure, with stage 1 through stage 4 chronic kidney disease, or unspecified chronic kidney disease: Secondary | ICD-10-CM | POA: Diagnosis not present

## 2021-01-12 DIAGNOSIS — M25572 Pain in left ankle and joints of left foot: Secondary | ICD-10-CM | POA: Insufficient documentation

## 2021-01-12 DIAGNOSIS — Z79899 Other long term (current) drug therapy: Secondary | ICD-10-CM | POA: Insufficient documentation

## 2021-01-12 DIAGNOSIS — L97519 Non-pressure chronic ulcer of other part of right foot with unspecified severity: Secondary | ICD-10-CM | POA: Diagnosis not present

## 2021-01-12 DIAGNOSIS — I10 Essential (primary) hypertension: Secondary | ICD-10-CM | POA: Insufficient documentation

## 2021-01-12 DIAGNOSIS — Z7984 Long term (current) use of oral hypoglycemic drugs: Secondary | ICD-10-CM | POA: Diagnosis not present

## 2021-01-12 DIAGNOSIS — Z87891 Personal history of nicotine dependence: Secondary | ICD-10-CM | POA: Diagnosis not present

## 2021-01-12 DIAGNOSIS — Z794 Long term (current) use of insulin: Secondary | ICD-10-CM | POA: Diagnosis not present

## 2021-01-12 DIAGNOSIS — E1122 Type 2 diabetes mellitus with diabetic chronic kidney disease: Secondary | ICD-10-CM | POA: Insufficient documentation

## 2021-01-12 DIAGNOSIS — E11621 Type 2 diabetes mellitus with foot ulcer: Secondary | ICD-10-CM | POA: Diagnosis present

## 2021-01-12 DIAGNOSIS — R202 Paresthesia of skin: Secondary | ICD-10-CM | POA: Insufficient documentation

## 2021-01-12 DIAGNOSIS — N189 Chronic kidney disease, unspecified: Secondary | ICD-10-CM | POA: Diagnosis not present

## 2021-01-12 MED ORDER — OXYCODONE-ACETAMINOPHEN 5-325 MG PO TABS: 1.0000 | ORAL_TABLET | Freq: Three times a day (TID) | ORAL | 0 refills | Status: AC | PRN

## 2021-01-12 MED ORDER — OXYCODONE-ACETAMINOPHEN 5-325 MG PO TABS
1.0000 | ORAL_TABLET | Freq: Once | ORAL | Status: AC
  Administered 2021-01-12: 1 via ORAL
  Filled 2021-01-12: qty 1

## 2021-01-12 NOTE — ED Triage Notes (Signed)
Pt complains of pain in her left ankle and inability to bend ankle. She reports she was putting a grill together and hearing a pop, then her foot dropped down.

## 2021-01-12 NOTE — Progress Notes (Signed)
NAIYELI, ROTELLA ([id number]) Visit Report for 01/12/2021 HPI Details Patient Name: Date of Service: [name] 01/12/2021 8:45 A M Medical Record Number: [id number] Patient Account Number: [id number] Date of Birth/Sex: Treating RN: March 07, 1971 (50 y.o. [name], [name] Primary Care Provider: [name] Other Clinician: Referring Provider: Treating Provider/Extender: Loma Sender in Treatment: 7 History of Present Illness HPI Description: Ms. Jatavia Guglielmetti is a 50 year old female with past medical history of insulin-dependent type 2 diabetes, previous left pontine CVA, essential hypertension that presents to the clinic today for evaluation of her right foot wound. She states that in December 2021 she had cut open her right great toe on a door step . It became infected and she was treated with antibiotics. She started following with podiatry in February and has been using Betadine daily on the wound. She is also using a surgical shoe to help with offloading. She has diabetic neuropathy and does not report pain to the wound. She denies any purulent drainage, fever/chills or increased warmth or erythema to the foot. 4/21; patient has been using silver alginate every other day with dressing changes. She denies any purulent drainage, fever/chills or increased warmth or erythema to the foot. She does however notice an odor. 4/26; patient presents for 1 week follow-up. She continues to use silver alginate every other day without any issues. She denies purulent drainage, fever/chills or increased warmth or erythema to the foot. She does not notice an odor anymore. She has no complaints today. 5/3; patient presents for 1 week follow-up. She states that she has an MRI scheduled for Friday. She has been using silver alginate every other day to the wound. She has noted more maceration. She denies any pain or acute signs of infection. 5/10; patient presents for  1 week follow-up. Her MRI had to be rescheduled due to needing a prior authorization. It is scheduled for 5/19. Patient has been using Hydrofera Blue every other day. She denies acute signs of infection. She has no complaints today. 5/17; patient presents for 1 week follow-up. She has an MRI scheduled on 5/19. She has been using silver alginate every other day to the wound. She states that after she took a shower she was able to remove part of the hardened callus to the tip of her toe. She denies signs of infection. 5/24; patient presents for 1 week follow-up. She had the MRI done. She is ready to do the cast today. She denies signs of infection. 5/27; patient presents for first cast change. She denies any issues for the past couple days while using the cast. She overall feels well. She denies signs of infection. 5/31; patient presents for cast change. She does have a small open wound to the anterior shin due to the cast Rubbing against it. Overall she feels well and denies signs of infection. 6/7; patient arrives for review of wound and a total contact cast change. Right first toe tip. She arrived with callus and dry skin on the wound. Removing this reveals only a small area that is open. This should be closed by next week. She tells me she is going to go to podiatry after closure for foot wear and insert review although I cautioned her we may need at least something to transition into her normal footwear in the interim i.e. a insole and perhaps a callus pad for this toe. We are using silver alginate currently as the primary dressing Electronic Signature(s) Signed: 01/12/2021 5:45:41  PM By: Linton Ham MD Entered By: Linton Ham on 01/12/2021 09:23:46 -------------------------------------------------------------------------------- Physical Exam Details Patient Name: Date of Service: [name] 01/12/2021 8:45 A M Medical Record Number: [id number] Patient Account Number:  [id number] Date of Birth/Sex: Treating RN: June 16, 1971 (50 y.o. Benjaman Lobe Primary Care Provider: [name] Other Clinician: Referring Provider: Treating Provider/Extender: Loma Sender in Treatment: 7 Constitutional Patient is hypertensive.. Pulse regular and within target range for patient.Marland Kitchen Respirations regular, non-labored and within target range.Marland Kitchen Appears in no distress. Notes Wound exam; tip of her right great toe hammer deformity at the interphalangeal joint. Tip of the wound had callus and dry skin around the wound. There is very little of the wound is open now. The anterior shin abrasion from last week is closed over Electronic Signature(s) Signed: 01/12/2021 5:45:41 PM By: Linton Ham MD Entered By: Linton Ham on 01/12/2021 09:24:45 -------------------------------------------------------------------------------- Physician Orders Details Patient Name: Date of Service: [name] 01/12/2021 8:45 A M Medical Record Number: [id number] Patient Account Number: [id number] Date of Birth/Sex: Treating RN: [birth year] (50 y.o. Benjaman Lobe Primary Care Provider: [name] Other Clinician: Referring Provider: Treating Provider/Extender: Loma Sender in Treatment: 7 Verbal / Phone Orders: No Diagnosis Coding Follow-up Appointments ppointment in 1 week. - Tuesday Return A ***Extra time for TCC: 60 minutes!!**** Bathing/ Shower/ Hygiene May shower and wash wound with soap and water. - on days when dressing is changed. Edema Control - Lymphedema / SCD / Other Elevate legs to the level of the heart or above for 30 minutes daily and/or when sitting, a frequency of: - throughout the day. Avoid standing for long periods of time. Off-Loading Total Contact Cast to Right Lower Extremity Non Wound Condition Protect area with: - Protect area at top of right leg with hydraferablue and  foam before applying TCC Wound Treatment Wound #1 - T Great oe Wound Laterality: Right Cleanser: Wound Cleanser Every Other Day/15 Days Discharge Instructions: Cleanse the wound with wound cleanser prior to applying a clean dressing using gauze sponges, not tissue or cotton balls. Prim Dressing: KerraCel Ag Gelling Fiber Dressing, 2x2 in (silver alginate) Every Other Day/15 Days ary Discharge Instructions: Apply silver alginate to wound bed as instructed Secondary Dressing: Woven Gauze Sponges 2x2 in (Generic) Every Other Day/15 Days Discharge Instructions: Apply over primary dressing as directed. Secondary Dressing: Zetuvit Plus 4x8 in Every Other Day/15 Days Discharge Instructions: Apply over primary dressing as directed. Secondary Dressing: Optifoam Non-Adhesive Dressing, 4x4 in Every Other Day/15 Days Discharge Instructions: ***Apply foam donut*** Apply over primary dressing as directed. Secured With: Child psychotherapist, Sterile 2x75 (in/in) (Generic) Every Other Day/15 Days Discharge Instructions: Secure with stretch gauze as directed. Secured With: Paper Tape, 2x10 (in/yd) (Generic) Every Other Day/15 Days Discharge Instructions: Secure dressing with tape as directed. Electronic Signature(s) Signed: 01/12/2021 5:27:47 PM By: Rhae Hammock RN Signed: 01/12/2021 5:45:41 PM By: Linton Ham MD Entered By: Rhae Hammock on 01/12/2021 09:19:54 -------------------------------------------------------------------------------- Progress Note Details Patient Name: Date of Service: [name] 01/12/2021 8:45 A M Medical Record Number: [id number] Patient Account Number: [id number] Date of Birth/Sex: Treating RN: 13-Jul-1971 (50 y.o. Benjaman Lobe Primary Care Provider: [name] Other Clinician: Referring Provider: Treating Provider/Extender: Loma Sender in Treatment: 7 Subjective History of Present Illness (HPI) Ms.  Ryin Rohr is a 50 year old female with past medical history of insulin-dependent type 2 diabetes, previous left pontine CVA,  essential hypertension that presents to the clinic today for evaluation of her right foot wound. She states that in December 2021 she had cut open her right great toe on a door step . It became infected and she was treated with antibiotics. She started following with podiatry in February and has been using Betadine daily on the wound. She is also using a surgical shoe to help with offloading. She has diabetic neuropathy and does not report pain to the wound. She denies any purulent drainage, fever/chills or increased warmth or erythema to the foot. 4/21; patient has been using silver alginate every other day with dressing changes. She denies any purulent drainage, fever/chills or increased warmth or erythema to the foot. She does however notice an odor. 4/26; patient presents for 1 week follow-up. She continues to use silver alginate every other day without any issues. She denies purulent drainage, fever/chills or increased warmth or erythema to the foot. She does not notice an odor anymore. She has no complaints today. 5/3; patient presents for 1 week follow-up. She states that she has an MRI scheduled for Friday. She has been using silver alginate every other day to the wound. She has noted more maceration. She denies any pain or acute signs of infection. 5/10; patient presents for 1 week follow-up. Her MRI had to be rescheduled due to needing a prior authorization. It is scheduled for 5/19. Patient has been using Hydrofera Blue every other day. She denies acute signs of infection. She has no complaints today. 5/17; patient presents for 1 week follow-up. She has an MRI scheduled on 5/19. She has been using silver alginate every other day to the wound. She states that after she took a shower she was able to remove part of the hardened callus to the tip of her toe. She denies  signs of infection. 5/24; patient presents for 1 week follow-up. She had the MRI done. She is ready to do the cast today. She denies signs of infection. 5/27; patient presents for first cast change. She denies any issues for the past couple days while using the cast. She overall feels well. She denies signs of infection. 5/31; patient presents for cast change. She does have a small open wound to the anterior shin due to the cast Rubbing against it. Overall she feels well and denies signs of infection. 6/7; patient arrives for review of wound and a total contact cast change. Right first toe tip. She arrived with callus and dry skin on the wound. Removing this reveals only a small area that is open. This should be closed by next week. She tells me she is going to go to podiatry after closure for foot wear and insert review although I cautioned her we may need at least something to transition into her normal footwear in the interim i.e. a insole and perhaps a callus pad for this toe. We are using silver alginate currently as the primary dressing Objective Constitutional Patient is hypertensive.. Pulse regular and within target range for patient.Marland Kitchen Respirations regular, non-labored and within target range.Marland Kitchen Appears in no distress. Vitals Time Taken: 8:43 AM, Height: 69 in, Weight: 250 lbs, BMI: 36.9, Temperature: 98.3 F, Pulse: 80 bpm, Respiratory Rate: 20 breaths/min, Blood Pressure: 179/95 mmHg. General Notes: Wound exam; tip of her right great toe hammer deformity at the interphalangeal joint. Tip of the wound had callus and dry skin around the wound. There is very little of the wound is open now. The anterior shin abrasion from last  week is closed over Integumentary (Hair, Skin) Wound #1 status is Open. Original cause of wound was Puncture. The date acquired was: 07/09/2020. The wound has been in treatment 7 weeks. The wound is located on the Right T Great. The wound measures 0.1cm length x 0.1cm  width x 0.1cm depth; 0.008cm^2 area and 0.001cm^3 volume. The wound is limited oe to skin breakdown. There is no tunneling or undermining noted. There is a small amount of serosanguineous drainage noted. The wound margin is indistinct and nonvisible. There is no granulation within the wound bed. There is no necrotic tissue within the wound bed. Procedures Wound #1 Pre-procedure diagnosis of Wound #1 is a Diabetic Wound/Ulcer of the Lower Extremity located on the Right T Great . There was a T Contact Cast oe otal Procedure by Ricard Dillon., MD. Post procedure Diagnosis Wound #1: Same as Pre-Procedure Plan Follow-up Appointments: Return Appointment in 1 week. - Tuesday ***Extra time for TCC: 60 minutes!!**** Bathing/ Shower/ Hygiene: May shower and wash wound with soap and water. - on days when dressing is changed. Edema Control - Lymphedema / SCD / Other: Elevate legs to the level of the heart or above for 30 minutes daily and/or when sitting, a frequency of: - throughout the day. Avoid standing for long periods of time. Off-Loading: T Contact Cast to Right Lower Extremity otal Non Wound Condition: Protect area with: - Protect area at top of right leg with hydraferablue and foam before applying TCC WOUND #1: - T Great Wound Laterality: Right oe Cleanser: Wound Cleanser Every Other Day/15 Days Discharge Instructions: Cleanse the wound with wound cleanser prior to applying a clean dressing using gauze sponges, not tissue or cotton balls. Prim Dressing: KerraCel Ag Gelling Fiber Dressing, 2x2 in (silver alginate) Every Other Day/15 Days ary Discharge Instructions: Apply silver alginate to wound bed as instructed Secondary Dressing: Woven Gauze Sponges 2x2 in (Generic) Every Other Day/15 Days Discharge Instructions: Apply over primary dressing as directed. Secondary Dressing: Zetuvit Plus 4x8 in Every Other Day/15 Days Discharge Instructions: Apply over primary dressing as  directed. Secondary Dressing: Optifoam Non-Adhesive Dressing, 4x4 in Every Other Day/15 Days Discharge Instructions: ***Apply foam donut*** Apply over primary dressing as directed. Secured With: Child psychotherapist, Sterile 2x75 (in/in) (Generic) Every Other Day/15 Days Discharge Instructions: Secure with stretch gauze as directed. Secured With: Paper T ape, 2x10 (in/yd) (Generic) Every Other Day/15 Days Discharge Instructions: Secure dressing with tape as directed. 1. I continued with silver alginate to the small remaining area on the right great toe. This should be completely closed by next week. 2. I asked her to bring her shoe with an insole callus pad to make sure and offload this while she is making more formal arrangements with podiatry for preventative foot wear Electronic Signature(s) Signed: 01/12/2021 5:45:41 PM By: Linton Ham MD Entered By: Linton Ham on 01/12/2021 09:25:34 -------------------------------------------------------------------------------- Total Contact Cast Details Patient Name: Date of Service: Charlton Haws NDA E. 01/12/2021 8:45 A M Medical Record Number: [id number] Patient Account Number: [id number] Date of Birth/Sex: Treating RN: [birth year] (50 y.o. [name], [name] Primary Care Provider: [name] Other Clinician: Referring Provider: Treating Provider/Extender: Loma Sender in Treatment: 7 T Contact Cast Applied for Wound Assessment: otal Wound #1 Right T Great oe Performed By: Physician Ricard Dillon., MD Post Procedure Diagnosis Same as Pre-procedure Electronic Signature(s) Signed: 01/12/2021 5:45:41 PM By: Linton Ham MD Entered By: Linton Ham on 01/12/2021 09:22:32 -------------------------------------------------------------------------------- SuperBill Details Patient Name:  Date of Service: [name] 01/12/2021 Medical Record Number: [id number] Patient Account  Number: [id number] Date of Birth/Sex: Treating RN: 17-Mar-1971 (50 y.o. 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

## 2021-01-12 NOTE — ED Provider Notes (Signed)
Emergency Medicine Provider Triage Evaluation Note  [name] 50 y.o. female was evaluated in triage.  Pt complains of pain, swelling of the right ankle, posterior leg.  She reports that she was walking to the fridge and states that she heard a loud pop and then had pain to her right ankle and right lower leg.  She states that since then, she has not been able to sit down on the leg.  She reports associated swelling.  She has neuropathy she has baseline numbness.  No redness.   Review of Systems  Positive: Ankle pain, swelling Negative: Redness  Physical Exam  BP 134/82   Pulse 70   Temp 98.2 F (36.8 C) (Oral)   Resp 18   Ht '5\' 4"'$  (1.626 m)   Wt 65.8 kg   SpO2 100%   BMI 24.89 kg/m  Gen:   Awake, no distress   HEENT:  Atraumatic  Resp:  Normal effort  Cardiac:  Normal rate.  2+ DP pulses noted to the left lower extremity. Abd:   Nondistended, nontender  MSK:   Diffuse tenderness palpation noted to the Achilles tendon.  There is overlying swelling noted to the ankle.  No overlying warmth, erythema.  Difficulty with dorsiflexion and plantarflexion. Neuro:  Speech clear   Other:      Medical Decision Making  Medically screening exam initiated at 5:29 PM  Appropriate orders placed.  [name] was informed that the remainder of the evaluation will be completed by another provider, this initial triage assessment does not replace that evaluation. They are counseled that they will need to remain in the ED until the completion of their workup, including full H&P and results of any tests.  Risks of leaving the emergency department prior to completion of treatment were discussed. Patient was advised to inform ED staff if they are leaving before their treatment is complete. The patient acknowledged these risks and time was allowed for questions.     The patient appears stable so that the remainder of the MSE may be completed by another provider.   Clinical Impression  Ankle  pain   Portions of this note were generated with Dragon dictation software. Dictation errors may occur despite best attempts at proofreading.      Volanda Napoleon, PA-C 01/12/21 1730    Milton Ferguson, MD 01/12/21 [phone]

## 2021-01-12 NOTE — Discharge Instructions (Signed)
You came to the emergency department today to be evaluated for your left ankle injury.  Your x-ray showed no broken bones or dislocation.  It did show swelling.  His exam was concerning for possible Achilles tendon injury.  Please call your podiatrist Dr. Posey Pronto and see if he can see you for this potential injury.  If he cannot please call Dr. Lyla Glassing.  Today you received medications that may make you sleepy or impair your ability to make decisions.  For the next 24 hours please do not drive, operate heavy machinery, care for a small child with out another adult present, or perform any activities that may cause harm to you or someone else if you were to fall asleep or be impaired.   You are being prescribed a medication which may make you sleepy. Please follow up of listed precautions for at least 24 hours after taking one dose.   Contact a health care provider if you have: More pain and swelling. Pain that is not controlled with medicines. New symptoms. Symptoms that get worse. Problems moving your toes or foot. Warmth in your foot. A fever.

## 2021-01-12 NOTE — ED Provider Notes (Signed)
[facility] DEPT Provider Note   CSN: [id number] Arrival date & time: 01/12/21  1652     History Chief Complaint  Patient presents with  . Ankle Pain    [name] is a 50 y.o. female presents to the emergency department with a chief complaint of left ankle pain.  Patient reports that earlier today she was cooking on her patio when she was walking at ground-level and felt a "pop," to her left ankle and calf.  Patient had immediate pain after this occurred.  Patient rates her pain 10/10 on the pain scale.  Pain is worse to touch.  Patient denies any alleviating factors; has not tried any over-the-counter medications.  Patient reports that she has been nonweightbearing since this event occurred.  Patient reports that she did not fall after this event occurred.  Patient reports that she has been unable to perform plantar flexion or dorsiflexion since this event occurred.  Patient reports that she has numbness to bilateral feet at baseline due to her neuropathy; no change in sensation.  Patient denies any swelling, erythema, rash, wounds.  Patient states that she has a history of Achilles tendon injuries to her right ankle.  Patient reports that this feels similar to previous Achilles tendon injuries.  HPI     Past Medical History:  Diagnosis Date  . Arthritis   . Asthma   . Cataract    Mixed form OU  . CKD (chronic kidney disease)   . Coronary artery disease   . Diabetes mellitus   . Diabetic retinopathy ([place])    NPDR OU  . Hyperlipidemia   . Hypertension   . Hypertensive retinopathy    OU  . Left thyroid nodule    diagnosed 07/2018  . Pseudotumor cerebri   . Stroke ([place])   . Vitamin D deficiency     Patient Active Problem List   Diagnosis Date Noted  . Symptomatic anemia 06/25/2020  . Chronic heart failure with preserved ejection fraction ([place]) 06/25/2020  . Educated about COVID-19 virus infection 04/29/2020  . Class 2 severe obesity  with serious comorbidity and body mass index (BMI) of 36.0 to 36.9 in adult [facility]) 03/30/2020  . NSVT (nonsustained ventricular tachycardia) ([place]) 11/13/2019  . Pericardial effusion 09/17/2019  . H/O: stroke 04/09/2019  . Left ventricular hypertrophy 04/09/2019  . Mixed hyperlipidemia 04/08/2019  . Uncontrolled type 2 diabetes mellitus with hyperglycemia ([place]) 04/08/2019  . Non compliance w medication regimen 02/20/2019  . Diabetes mellitus type 2 in obese ([place])   . Resistant hypertension   . 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[place]) 02/13/2019  . Cerebrovascular accident (CVA) ([place])   . Hypokalemia   . Hypomagnesemia   . AKI (acute kidney injury) ([place])   . Acute CVA (cerebrovascular accident) ([place]) 02/11/2019  . Hypertensive urgency 02/11/2019  . ARF (acute renal failure) ([place]) 02/11/2019  . Type 2 diabetes mellitus with vascular disease ([place]) 02/11/2019  . Diabetes mellitus, type 2 ([place]) 04/12/2018  . Essential hypertension 01/08/2018  . Obstructive sleep apnea 08/26/2017    Past Surgical History:  Procedure Laterality Date  . ACHILLES TENDON REPAIR    . TUBAL LIGATION       OB History    Gravida  4   Para  4   Term  4   Preterm      AB      Living  4     SAB      IAB      Ectopic  Multiple      Live Births              Family History  Problem Relation Age of Onset  . Asthma Mother   . Hypertension Father   . Kidney disease Father   . Asthma Other   . Hyperlipidemia Other   . Hypertension Other   . Cancer Other   . Diabetes Maternal Grandmother   . Congestive Heart Failure Maternal Grandmother   . Hypertension Sister   . Asthma Sister   . Congestive Heart Failure Maternal Aunt     Social History   Tobacco Use  . Smoking status: Former Smoker    Packs/day: 0.50    Years: 30.00    Pack years: 15.00    Types: Cigarettes    Quit date: 2017    Years since quitting: 5.4  . Smokeless tobacco: Never Used  Vaping Use  . Vaping Use: Never used   Substance Use Topics  . Alcohol use: Not Currently    Alcohol/week: 0.0 standard drinks    Comment: rare  . Drug use: Yes    Types: Marijuana    Comment: Daily    Home Medications Prior to Admission medications   Medication Sig Start Date End Date Taking? Authorizing Provider  Accu-Chek FastClix Lancets MISC 4 (four) times daily. 05/28/19   [provider]  ACCU-CHEK GUIDE test strip 1 each by Other route See admin instructions. for testing 08/28/20   Woodroe Mode, MD  albuterol (VENTOLIN HFA) 108 (90 Base) MCG/ACT inhaler Inhale into the lungs every 6 (six) hours as needed for wheezing or shortness of breath.    [provider]  amLODipine-benazepril (LOTREL) 10-40 MG capsule Take 1 capsule by mouth daily. 10/14/20   Patwardhan, Reynold Bowen, MD  aspirin 81 MG EC tablet Take 1 tablet (81 mg total) by mouth daily. 10/14/20   Patwardhan, Reynold Bowen, MD  blood glucose meter kit and supplies KIT Dispense based on patient and insurance preference. Use up to four times daily as directed. (FOR ICD-9 250.00, 250.01). 04/12/18   Martyn Ehrich, NP  carvedilol (COREG) 25 MG tablet Take 1 tablet (25 mg total) by mouth 2 (two) times daily with a meal. 10/14/20   Patwardhan, Manish J, MD  Cholecalciferol (VITAMIN D3) 25 MCG (1000 UT) CAPS Take 1 capsule by mouth daily. 05/28/19   [provider]  folic acid (FOLVITE) 1 MG tablet Take 1 mg by mouth daily.    [provider]  furosemide (LASIX) 20 MG tablet Take 2 tablets (40 mg total) by mouth 2 (two) times daily. Take in the morning and afternoon 10/14/20   Patwardhan, Manish J, MD  glipiZIDE (GLUCOTROL) 10 MG tablet Take 10 mg by mouth daily. 03/19/19   [provider]  insulin aspart protamine- aspart (NOVOLOG MIX 70/30) (70-30) 100 UNIT/ML injection Inject 0.15 mLs (15 Units total) into the skin 2 (two) times daily with a meal. Patient taking differently: Inject 15-17 Units into the skin See admin instructions.  Inject 15 units subcutaneous in the morning, then inject 17 units subcutaneous in the evening 02/20/19   Love, Ivan Anchors, PA-C  Insulin Syringes, Disposable, L-381 1 ML MISC 1 application by Does not apply route 2 (two) times a day. 02/20/19   Love, Ivan Anchors, PA-C  ipratropium-albuterol (DUONEB) 0.5-2.5 (3) MG/3ML SOLN Take 3 mLs by nebulization every 4 (four) hours as needed. 06/25/20   Little Ishikawa, MD  isosorbide-hydrALAZINE (BIDIL) 20-37.5 MG tablet  Take 2 tablets by mouth 3 (three) times daily. 10/14/20   Patwardhan, Reynold Bowen, MD  metFORMIN (GLUCOPHAGE) 1000 MG tablet Take 1,000 mg by mouth 2 (two) times daily. 03/19/19   [provider]  rosuvastatin (CRESTOR) 10 MG tablet Take 1 tablet (10 mg total) by mouth daily. 10/14/20   Patwardhan, Reynold Bowen, MD  vitamin B-12 (CYANOCOBALAMIN) 1000 MCG tablet Take 1,000 mcg by mouth daily.    [provider]    Allergies    Patient has no known allergies.  Review of Systems   Review of Systems  Musculoskeletal: Positive for arthralgias and myalgias.  Skin: Negative for color change, pallor, rash and wound.  Neurological: Positive for weakness (Dorsiflexion and plantar flexion of left foot/ankle) and numbness (Baseline to bilateral lower extremities).    Physical Exam Updated Vital Signs BP (!) 179/82 (BP Location: Right Arm)   Pulse 77   Temp 98.1 F (36.7 C) (Oral)   Resp 16   SpO2 98%   Physical Exam Vitals and nursing note reviewed.  Constitutional:      General: She is not in acute distress.    Appearance: She is not ill-appearing, toxic-appearing or diaphoretic.  HENT:     Head: Normocephalic.  Eyes:     General: No scleral icterus.       Right eye: No discharge.        Left eye: No discharge.  Cardiovascular:     Rate and Rhythm: Normal rate.     Pulses:          Dorsalis pedis pulses are 2+ on the left side.  Pulmonary:     Effort: Pulmonary effort is normal.  Musculoskeletal:     Left lower leg:  Tenderness present. No swelling, deformity, lacerations or bony tenderness. No edema.     Left ankle: Swelling present. No deformity, ecchymosis or lacerations. No tenderness. No lateral malleolus or medial malleolus tenderness. Decreased range of motion.     Left Achilles Tendon: Tenderness present. No defects. Thompson's test positive.     Left foot: Normal range of motion. No swelling, deformity, laceration, tenderness or bony tenderness. Normal pulse.     Comments: Patient has cam walking boot to right lower extremity; states this is present due to wound on her right foot. Tenderness to distal aspect of left calf Tenderness to Achilles tendon, positive Thompson's test, no palpable Achilles tendon defect Patient is unable to perform plantar flexion and dorsiflexion to left ankle Patient has full flexion and extension of all digits to left foot  Feet:     Left foot:     Skin integrity: Skin integrity normal.  Skin:    General: Skin is warm and dry.     Findings: No erythema or rash.  Neurological:     General: No focal deficit present.     Mental Status: She is alert.  Psychiatric:        Behavior: Behavior is cooperative.     ED Results / Procedures / Treatments   Labs (all labs ordered are listed, but only abnormal results are displayed) Labs Reviewed - No data to display  EKG None  Radiology DG Ankle Complete Left  Result Date: 01/12/2021 CLINICAL DATA:  Ankle pain and swelling. EXAM: LEFT ANKLE COMPLETE - 3+ VIEW COMPARISON:  10/06/2016 FINDINGS: Diffuse soft tissue swelling is identified. No acute fracture or dislocation identified. Degenerative changes are noted within the midfoot tarsal bones. Small posterior calcaneal heel spur. IMPRESSION: 1. Diffuse soft tissue  swelling. 2. No acute bone abnormality. Electronically Signed   By: Kerby Moors M.D.   On: 01/12/2021 19:46    Procedures Procedures   Medications Ordered in ED Medications  oxyCODONE-acetaminophen  (PERCOCET/ROXICET) 5-325 MG per tablet 1 tablet (1 tablet Oral Given 01/12/21 2122)    ED Course  I have reviewed the triage vital signs and the nursing notes.  Pertinent labs & imaging results that were available during my care of the patient were reviewed by me and considered in my medical decision making (see chart for details).    MDM Rules/Calculators/A&P                          Alert 50 year old female in no acute distress, nontoxic-appearing.  Patient presents with chief complaint of left ankle injury.  Patient was walking at ground-level when she felt a "pop," in her left lower extremity/ankle.  Patient has had pain, been nonweightbearing, and unable to perform dorsiflexion or plantarflexion since this event.  Patient endorses history of Achilles tendon injury to right ankle.  On physical exam patient is unable to perform plantar flexion and dorsiflexion to left ankle.  Patient has tenderness to Achilles tendon, positive Thompson sign, and no palpable Achilles tendon defect.  Patient has diffuse swelling to ankle.  X-ray imaging obtained shows no acute bony abnormality, diffuse soft tissue swelling.  Based on patient's physical exam suspect Achilles tendon injury.  Will place patient in short leg splint and make her nonweightbearing with crutches.  Patient does have cam walking boot to right lower extremity due to foot wound however states that she has no weight bearing restrictions on that limb.  Patient is followed by podiatrist Dr. Posey Pronto with [facility].  We will have patient call Dr. Serita Grit office tomorrow morning to see if she can be seen by him for possible Achilles tendon injury.  Patient was also given information to follow-up with orthopedic Dr. Lyla Glassing if her podiatrist is unable to manage Achilles tendon injuries.  Patient given short course of Percocet to help with pain management.  Patient given strict return precautions.  Patient expressed understanding of all  instructions and is agreeable with this plan.  Final Clinical Impression(s) / ED Diagnoses Final diagnoses:  Acute left ankle pain    Rx / DC Orders ED Discharge Orders         Ordered    oxyCODONE-acetaminophen (PERCOCET/ROXICET) 5-325 MG tablet  Every 8 hours PRN        01/12/21 2136           Loni Beckwith, PA-C 01/13/21 2334    Daleen Bo, MD 01/13/21 1133

## 2021-01-13 NOTE — Progress Notes (Signed)
Orthopedic Tech Progress Note Patient Details:  [name] 08/08/1971 [id number]  Ortho Devices Type of Ortho Device: Short leg splint,Crutches Ortho Device/Splint Location: Left Foot Ortho Device/Splint Interventions: Application   Post Interventions Patient Tolerated: Well Instructions Provided: Care of device   [name] [name] 01/13/2021, 9:17 AM

## 2021-01-13 NOTE — Progress Notes (Signed)
[name], [name] ([id number]) Visit Report for 01/12/2021 Arrival Information Details Patient Name: Date of Service: Teresa [name] NDA E. 01/12/2021 8:45 A M Medical Record Number: [id number] Patient Account Number: [id number] Date of Birth/Sex: Treating RN: 11-Aug-1970 (50 y.o. [name], [name] Primary Care [name]: [name] Other Clinician: Referring [name]: Treating [name]/Extender: Loma Sender in Treatment: 7 Visit Information History Since Last Visit Added or deleted any medications: No Patient Arrived: Ambulatory Any new allergies or adverse reactions: No Arrival Time: 08:43 Had a fall or experienced change in No Accompanied By: self activities of daily living that may affect Transfer Assistance: None risk of falls: Patient Identification Verified: Yes Signs or symptoms of abuse/neglect since last visito No Secondary Verification Process Completed: Yes Hospitalized since last visit: No Patient Requires Transmission-Based Precautions: No Implantable device outside of the clinic excluding No Patient Has Alerts: No cellular tissue based products placed in the center since last visit: Has Dressing in Place as Prescribed: Yes Pain Present Now: No Electronic Signature(s) Signed: 01/12/2021 2:13:29 PM By: [name] Entered By: [name] on 01/12/2021 08:43:29 -------------------------------------------------------------------------------- Encounter Discharge Information Details Patient Name: Date of Service: Teresa [name] NDA E. 01/12/2021 8:45 A M Medical Record Number: [id number] Patient Account Number: [id number] Date of Birth/Sex: Treating RN: 1971/07/19 (50 y.o. [name] Primary Care Mailynn Everly: [name] Other Clinician: Referring Virginie Josten: Treating Evren Shankland/Extender: Loma Sender in Treatment: 7 Encounter Discharge Information Items Discharge Condition:  Stable Ambulatory Status: Ambulatory Discharge Destination: Home Transportation: Private Auto Schedule Follow-up Appointment: Yes Clinical Summary of Care: Provided on 01/12/2021 Form Type Recipient Paper Patient Patient Electronic Signature(s) Signed: 01/12/2021 11:36:39 AM By: [name] Entered By: [name] on 01/12/2021 11:36:39 -------------------------------------------------------------------------------- Lower Extremity Assessment Details Patient Name: Date of Service: Teresa [name] NDA E. 01/12/2021 8:45 A M Medical Record Number: [id number] Patient Account Number: [id number] Date of Birth/Sex: Treating RN: 17-Sep-1970 (50 y.o. [name] Primary Care Kaevion Sinclair: [name] Other Clinician: Referring [name]: Treating [name]/Extender: Loma Sender in Treatment: 7 Edema Assessment Assessed: [name]: No] [Right: No] Edema: [Left: N] [Right: o] Calf Left: Right: Point of Measurement: From Medial Instep 37.5 cm Ankle Left: Right: Point of Measurement: From Medial Instep 22.3 cm Vascular Assessment Pulses: Dorsalis Pedis Palpable: [Right:Yes] Electronic Signature(s) Signed: 01/13/2021 6:12:56 PM By: Teresa Gouty RN, [name] Entered By: [name] on 01/12/2021 08:59:34 -------------------------------------------------------------------------------- Multi Wound Chart Details Patient Name: Date of Service: Teresa [name] NDA E. 01/12/2021 8:45 A M Medical Record Number: [id number] Patient Account Number: [id number] Date of Birth/Sex: Treating RN: 01-15-1971 (50 y.o. [name], [name] Primary Care Komal Stangelo: [name] Other Clinician: Referring [name]: Treating [name]/Extender: Loma Sender in Treatment: 7 Vital Signs Height(in): 69 Pulse(bpm): 80 Weight(lbs): 250 Blood Pressure(mmHg): 179/95 Body Mass Index(BMI): 37 Temperature(F): 98.3 Respiratory Rate(breaths/min):  20 Photos: [1:No Photos Right T Great oe] [N/A:N/A N/A] Wound Location: [1:Puncture] [N/A:N/A] Wounding Event: [1:Diabetic Wound/Ulcer of the Lower] [N/A:N/A] Primary Etiology: [1:Extremity Anemia, Asthma, Sleep Apnea,] [N/A:N/A] Comorbid History: [1:Coronary Artery Disease, Hypertension, Type II Diabetes, Osteoarthritis, Neuropathy, Confinement Anxiety 07/09/2020] [N/A:N/A] Date Acquired: [1:7] [N/A:N/A] Weeks of Treatment: [1:Open] [N/A:N/A] Wound Status: [1:0.1x0.1x0.1] [N/A:N/A] Measurements L x W x D (cm) [1:0.008] [N/A:N/A] A (cm) : rea [1:0.001] [N/A:N/A] Volume (cm) : [1:93.70%] [N/A:N/A] % Reduction in A rea: [1:98.90%] [N/A:N/A] % Reduction in Volume: [1:Grade 2] [N/A:N/A] Classification: [1:Small] [N/A:N/A] Exudate A mount: [1:Serosanguineous] [N/A:N/A] Exudate Type: [1:red, brown] [N/A:N/A] Exudate Color: [1:Indistinct, nonvisible] [N/A:N/A]  Wound Margin: [1:None Present (0%)] [N/A:N/A] Granulation A mount: [1:None Present (0%)] [N/A:N/A] Necrotic A mount: [1:Fascia: No] [N/A:N/A] Exposed Structures: [1:Fat Layer (Subcutaneous Tissue): No Tendon: No Muscle: No Joint: No Bone: No Limited to Skin Breakdown Large (67-100%)] [N/A:N/A] Epithelialization: [1:T Contact Cast otal] [N/A:N/A] Treatment Notes Electronic Signature(s) Signed: 01/12/2021 5:27:47 PM By: Teresa Hammock RN Signed: 01/12/2021 5:45:41 PM By: Teresa Ham MD Entered By: [name] on 01/12/2021 09:22:20 -------------------------------------------------------------------------------- Multi-Disciplinary Care Plan Details Patient Name: Date of Service: Teresa [name] NDA E. 01/12/2021 8:45 A M Medical Record Number: [name] Patient Account Number: [id number] Date of Birth/Sex: Treating RN: Apr 05, 1971 (50 y.o. [name], [name] Primary Care [name]: [name] Other Clinician: Referring [name]: Treating [name]/Extender: Loma Sender in Treatment:  7 Multidisciplinary Care Plan reviewed with physician Active Inactive Electronic Signature(s) Signed: 01/12/2021 5:27:47 PM By: Teresa Hammock RN Entered By: [name] on 01/12/2021 09:17:08 -------------------------------------------------------------------------------- Pain Assessment Details Patient Name: Date of Service: Teresa [name] NDA E. 01/12/2021 8:45 A M Medical Record Number: [name] Patient Account Number: [id number] Date of Birth/Sex: Treating RN: [birth year] (51 y.o. [name], [name] Primary Care Caprisha Bridgett: [name] Other Clinician: Referring Malani Lees: Treating Kaylum Shrum/Extender: Loma Sender in Treatment: 7 Active Problems Location of Pain Severity and Description of Pain Patient Has Paino No Site Locations Rate the pain. Current Pain Level: 0 Pain Management and Medication Current Pain Management: Electronic Signature(s) Signed: 01/12/2021 5:27:47 PM By: Teresa Hammock RN Signed: 01/13/2021 6:12:56 PM By: Teresa Gouty RN, [name] Entered By: [name] on 01/12/2021 [id number] -------------------------------------------------------------------------------- Patient/Caregiver Education Details Patient Name: Date of Service: Teresa [name] NDA E. 6/7/2022andnbsp8:45 A M Medical Record Number: [name] Patient Account Number: [id number] Date of Birth/Gender: Treating RN: 05/18/1971 (50 y.o. [name], [name] Primary Care Physician: [name] Other Clinician: Referring Physician: Treating Physician/Extender: Loma Sender in Treatment: 7 Education Assessment Education Provided To: Patient Education Topics Provided Wound/Skin Impairment: Methods: Explain/Verbal Responses: State content correctly Electronic Signature(s) Signed: 01/12/2021 5:27:47 PM By: Teresa Hammock RN Entered By: [name] on 01/12/2021  09:17:22 -------------------------------------------------------------------------------- Wound Assessment Details Patient Name: Date of Service: Teresa [name] NDA E. 01/12/2021 8:45 A M Medical Record Number: [name] Patient Account Number: [id number] Date of Birth/Sex: Treating RN: Jan 20, 1971 (50 y.o. [name] Primary Care Saya Mccoll: [name] Other Clinician: Referring Pierra Skora: Treating Lilliam Chamblee/Extender: Loma Sender in Treatment: 7 Wound Status Wound Number: 1 Primary Diabetic Wound/Ulcer of the Lower Extremity Etiology: Wound Location: Right T Great oe Wound Open Wounding Event: Puncture Status: Date Acquired: 07/09/2020 Comorbid Anemia, Asthma, Sleep Apnea, Coronary Artery Disease, Weeks Of Treatment: 7 History: Hypertension, Type II Diabetes, Osteoarthritis, Neuropathy, Clustered Wound: No Confinement Anxiety Photos Wound Measurements Length: (cm) 0.1 Width: (cm) 0.1 Depth: (cm) 0.1 Area: (cm) 0.008 Volume: (cm) 0.001 % Reduction in Area: 93.7% % Reduction in Volume: 98.9% Epithelialization: Large (67-100%) Tunneling: No Undermining: No Wound Description Classification: Grade 2 Wound Margin: Indistinct, nonvisible Exudate Amount: Small Exudate Type: Serosanguineous Exudate Color: red, brown Foul Odor After Cleansing: No Slough/Fibrino No Wound Bed Granulation Amount: None Present (0%) Exposed Structure Necrotic Amount: None Present (0%) Fascia Exposed: No Fat Layer (Subcutaneous Tissue) Exposed: No Tendon Exposed: No Muscle Exposed: No Joint Exposed: No Bone Exposed: No Limited to Skin Breakdown Treatment Notes Wound #1 (Toe Great) Wound Laterality: Right Cleanser Wound Cleanser Discharge Instruction: Cleanse the wound with wound cleanser prior to applying a clean dressing using gauze sponges, not tissue or cotton  balls. Peri-Wound Care Topical Primary Dressing KerraCel Ag Gelling Fiber Dressing,  2x2 in (silver alginate) Discharge Instruction: Apply silver alginate to wound bed as instructed Secondary Dressing Zetuvit Plus 4x8 in Discharge Instruction: Apply over primary dressing as directed. Woven Gauze Sponges 2x2 in Discharge Instruction: Apply over primary dressing as directed. Optifoam Non-Adhesive Dressing, 4x4 in Discharge Instruction: ***Apply foam donut*** Apply over primary dressing as directed. Secured With Conforming Stretch Gauze Bandage, Sterile 2x75 (in/in) Discharge Instruction: Secure with stretch gauze as directed. Paper Tape, 2x10 (in/yd) Discharge Instruction: Secure dressing with tape as directed. Compression Wrap Compression Stockings Add-Ons Electronic Signature(s) Signed: 01/12/2021 5:03:12 PM By: [name] Signed: 01/13/2021 6:12:56 PM By: Teresa Gouty RN, [name] Entered By: [name] on 01/12/2021 16:33:37 -------------------------------------------------------------------------------- Vitals Details Patient Name: Date of Service: Teresa [name] NDA E. 01/12/2021 8:45 A M Medical Record Number: [id number] Patient Account Number: [id number] Date of Birth/Sex: Treating RN: 12/21/1970 (50 y.o. [name], [name] Primary Care Lilyann Gravelle: [name] Other Clinician: Referring Jonmichael Beadnell: Treating Kennidy Lamke/Extender: Loma Sender in Treatment: 7 Vital Signs Time Taken: 08:43 Temperature (F): 98.3 Height (in): 69 Pulse (bpm): 80 Weight (lbs): 250 Respiratory Rate (breaths/min): 20 Body Mass Index (BMI): 36.9 Blood Pressure (mmHg): 179/95 Reference Range: 80 - 120 mg / dl Electronic Signature(s) Signed: 01/12/2021 2:13:29 PM By: [name] Entered By: [name] on 01/12/2021 08:43:47

## 2021-01-15 ENCOUNTER — Encounter: Payer: Self-pay | Admitting: Podiatry

## 2021-01-15 ENCOUNTER — Ambulatory Visit: Payer: Medicaid Other | Admitting: Podiatry

## 2021-01-15 ENCOUNTER — Other Ambulatory Visit: Payer: Self-pay

## 2021-01-15 DIAGNOSIS — M7662 Achilles tendinitis, left leg: Secondary | ICD-10-CM | POA: Diagnosis not present

## 2021-01-15 NOTE — Progress Notes (Signed)
Subjective:  Patient ID: [name], female    DOB: 1971/07/10,  MRN: [id number]  Chief Complaint  Patient presents with   Foot Injury    Left foot injury ER follow up     50 y.o. female presents with the above complaint.  Patient presents with complaint of left posterior Achilles pain.  Patient states that she was trying to grab something out of the fridge and felt a pop in the back of her leg and she stated started hurting him emergently.  She states he went to the emergency room they did an x-rays which was negative for any kind of fractures.  She was sent here to be evaluated further.  She was treated in the past for a right foot wound which has not been taking care of by the wound care center and he is not currently in total contact cast.  She denies any other acute complaint she still has numbing sensation to it.  She is in a posterior splint.   Review of Systems: Negative except as noted in the HPI. Denies N/V/F/Ch.  Past Medical History:  Diagnosis Date   Arthritis    Asthma    Cataract    Mixed form OU   CKD (chronic kidney disease)    Coronary artery disease    Diabetes mellitus    Diabetic retinopathy (HCC)    NPDR OU   Hyperlipidemia    Hypertension    Hypertensive retinopathy    OU   Left thyroid nodule    diagnosed 07/2018   Pseudotumor cerebri    Stroke [facility])    Vitamin D deficiency     Current Outpatient Medications:    Accu-Chek FastClix Lancets MISC, 4 (four) times daily., Disp: , Rfl:    ACCU-CHEK GUIDE test strip, 1 each by Other route See admin instructions. for testing, Disp: 100 each, Rfl: 2   albuterol (VENTOLIN HFA) 108 (90 Base) MCG/ACT inhaler, Inhale into the lungs every 6 (six) hours as needed for wheezing or shortness of breath., Disp: , Rfl:    amLODipine-benazepril (LOTREL) 10-40 MG capsule, Take 1 capsule by mouth daily., Disp: 90 capsule, Rfl: 2   aspirin 81 MG EC tablet, Take 1 tablet (81 mg total) by mouth daily., Disp: 90 tablet,  Rfl: 2   blood glucose meter kit and supplies KIT, Dispense based on patient and insurance preference. Use up to four times daily as directed. (FOR ICD-9 250.00, 250.01)., Disp: 1 each, Rfl: 0   carvedilol (COREG) 25 MG tablet, Take 1 tablet (25 mg total) by mouth 2 (two) times daily with a meal., Disp: 180 tablet, Rfl: 2   Cholecalciferol (VITAMIN D3) 25 MCG (1000 UT) CAPS, Take 1 capsule by mouth daily., Disp: , Rfl:    folic acid (FOLVITE) 1 MG tablet, Take 1 mg by mouth daily., Disp: , Rfl:    furosemide (LASIX) 20 MG tablet, Take 2 tablets (40 mg total) by mouth 2 (two) times daily. Take in the morning and afternoon, Disp: 90 tablet, Rfl: 3   glipiZIDE (GLUCOTROL) 10 MG tablet, Take 10 mg by mouth daily., Disp: , Rfl:    insulin aspart protamine- aspart (NOVOLOG MIX 70/30) (70-30) 100 UNIT/ML injection, Inject 0.15 mLs (15 Units total) into the skin 2 (two) times daily with a meal. (Patient taking differently: Inject 15-17 Units into the skin See admin instructions. Inject 15 units subcutaneous in the morning, then inject 17 units subcutaneous in the evening), Disp: 10 mL, Rfl: 11  Insulin Syringes, Disposable, U-100 1 ML MISC, 1 application by Does not apply route 2 (two) times a day., Disp: 100 each, Rfl: 0   ipratropium-albuterol (DUONEB) 0.5-2.5 (3) MG/3ML SOLN, Take 3 mLs by nebulization every 4 (four) hours as needed., Disp: 360 mL, Rfl: 0   isosorbide-hydrALAZINE (BIDIL) 20-37.5 MG tablet, Take 2 tablets by mouth 3 (three) times daily., Disp: 540 tablet, Rfl: 3   metFORMIN (GLUCOPHAGE) 1000 MG tablet, Take 1,000 mg by mouth 2 (two) times daily., Disp: , Rfl:    oxyCODONE-acetaminophen (PERCOCET/ROXICET) 5-325 MG tablet, Take 1 tablet by mouth every 8 (eight) hours as needed for up to 3 days for severe pain., Disp: 9 tablet, Rfl: 0   rosuvastatin (CRESTOR) 10 MG tablet, Take 1 tablet (10 mg total) by mouth daily., Disp: 90 tablet, Rfl: 3   vitamin B-12 (CYANOCOBALAMIN) 1000 MCG tablet, Take  1,000 mcg by mouth daily., Disp: , Rfl:   Social History   Tobacco Use  Smoking Status Former   Packs/day: 0.50   Years: 30.00   Pack years: 15.00   Types: Cigarettes   Quit date: 2017   Years since quitting: 5.4  Smokeless Tobacco Never    No Known Allergies Objective:  There were no vitals filed for this visit. There is no height or weight on file to calculate BMI. Constitutional Well developed. Well nourished.  Vascular Dorsalis pedis pulses palpable bilaterally. Posterior tibial pulses palpable bilaterally. Capillary refill normal to all digits.  No cyanosis or clubbing noted. Pedal hair growth normal.  Neurologic Normal speech. Oriented to person, place, and time. Epicritic sensation to light touch grossly present bilaterally.  Dermatologic Nails well groomed and normal in appearance. No open wounds. No skin lesions.  Orthopedic: Pain on palpation along the course of the Achilles tendon at the mid substance level.  No pain at the insertion of the Achilles tendon.  No hatchet strike defect noted.  Plantarflexion and dorsiflexion range of motion noted at the ankle joint.  It appears to be about 4 out of 5 in strength.   Radiographs:. Diffuse soft tissue swelling. 2. No acute bone abnormality. Assessment:   1. Achilles tendinitis, left leg    Plan:  Patient was evaluated and treated and all questions answered.  Left Achilles tendinitis versus plantars tearing -I explained the patient the etiology of Achilles tendinitis and various treatment options were discussed.  Patient complained clinically correlates with Achilles midsubstance.  Patient may have had a few strands of Achilles tendon that are torn but the entire Achilles appears to be clinically intact.  She may have also torn a plantaris tendon which could have felt and explained the pop that she felt.  However given the nature of the injury I believe she will benefit from an MRI evaluation. -MRI of the left ankle  was sent in.  I will review it in few weeks and we will discuss surgical options if indicated.  Patient states understanding -She will continue be nonweightbearing to the left lower extremity with a knee scooter/crutches and a posterior splint.  Right toe ulcer secondary to diabetes -Being primarily managed by the wound care center and is currently total contact cast  No follow-ups on file.

## 2021-01-19 ENCOUNTER — Other Ambulatory Visit: Payer: Self-pay

## 2021-01-19 ENCOUNTER — Encounter (HOSPITAL_BASED_OUTPATIENT_CLINIC_OR_DEPARTMENT_OTHER): Payer: Medicaid Other | Admitting: Internal Medicine

## 2021-01-19 DIAGNOSIS — E11621 Type 2 diabetes mellitus with foot ulcer: Secondary | ICD-10-CM | POA: Diagnosis not present

## 2021-01-20 NOTE — Progress Notes (Signed)
RAGAN, BLOOMQUIST ([id number]) Visit Report for 01/19/2021 HPI Details Patient Name: Date of Service: MA [name] NDA E. 01/19/2021 9:45 A M Medical Record Number: [id number] Patient Account Number: [id number] Date of Birth/Sex: Treating RN: August 26, 1970 (50 y.o. [name], [name] Primary Care Provider: Benito [name] Other Clinician: Referring Provider: Treating Provider/Extender: [name] in Treatment: 8 History of Present Illness HPI Description: [name] is a 50 year old female with past medical history of insulin-dependent type 2 diabetes, previous left pontine CVA, essential hypertension that presents to the clinic today for evaluation of her right foot wound. She states that in December 2021 she had cut open her right great toe on a door step . It became infected and she was treated with antibiotics. She started following with podiatry in February and has been using Betadine daily on the wound. She is also using a surgical shoe to help with offloading. She has diabetic neuropathy and does not report pain to the wound. She denies any purulent drainage, fever/chills or increased warmth or erythema to the foot. 4/21; patient has been using silver alginate every other day with dressing changes. She denies any purulent drainage, fever/chills or increased warmth or erythema to the foot. She does however notice an odor. 4/26; patient presents for 1 week follow-up. She continues to use silver alginate every other day without any issues. She denies purulent drainage, fever/chills or increased warmth or erythema to the foot. She does not notice an odor anymore. She has no complaints today. 5/3; patient presents for 1 week follow-up. She states that she has an MRI scheduled for Friday. She has been using silver alginate every other day to the wound. She has noted more maceration. She denies any pain or acute signs of infection. 5/10; patient presents  for 1 week follow-up. Her MRI had to be rescheduled due to needing a prior authorization. It is scheduled for 5/19. Patient has been using Hydrofera Blue every other day. She denies acute signs of infection. She has no complaints today. 5/17; patient presents for 1 week follow-up. She has an MRI scheduled on 5/19. She has been using silver alginate every other day to the wound. She states that after she took a shower she was able to remove part of the hardened callus to the tip of her toe. She denies signs of infection. 5/24; patient presents for 1 week follow-up. She had the MRI done. She is ready to do the cast today. She denies signs of infection. 5/27; patient presents for first cast change. She denies any issues for the past couple days while using the cast. She overall feels well. She denies signs of infection. 5/31; patient presents for cast change. She does have a small open wound to the anterior shin due to the cast Rubbing against it. Overall she feels well and denies signs of infection. 6/7; patient arrives for review of wound and a total contact cast change. Right first toe tip. She arrived with callus and dry skin on the wound. Removing this reveals only a small area that is open. This should be closed by next week. She tells me she is going to go to podiatry after closure for foot wear and insert review although I cautioned her we may need at least something to transition into her normal footwear in the interim i.e. a insole and perhaps a callus pad for this toe. We are using silver alginate currently as the primary dressing 6/14; the patient's wound on  the tip of her right great toe is healed. Since last week she had a problem with her left foot and she is waiting to have an MRI. She has not been walking much therefore. She has a reasonably new Luking running shoe to put on her right foot but nothing to pad the tip of the right great toe Electronic Signature(s) Signed: 01/19/2021  4:29:11 PM By: Linton Ham MD Entered By: Linton Ham on 01/19/2021 09:37:02 -------------------------------------------------------------------------------- Physical Exam Details Patient Name: Date of Service: MA [name] NDA E. 01/19/2021 9:45 A M Medical Record Number: [id number] Patient Account Number: [id number] Date of Birth/Sex: Treating RN: [birth year] (50 y.o. Benjaman Lobe Primary Care Provider: Benito [name] Other Clinician: Referring Provider: Treating Provider/Extender: [name] in Treatment: 8 Constitutional Sitting or standing Blood Pressure is within target range for patient.. Pulse regular and within target range for patient.Marland Kitchen Respirations regular, non-labored and within target range.. Temperature is normal and within the target range for the patient.Marland Kitchen Appears in no distress. Notes Wound exam; the tip of her right great toe with a hammer deformity at the interphalangeal joint. There is no open area here some callus. No debridement necessary Electronic Signature(s) Signed: 01/19/2021 4:29:11 PM By: Linton Ham MD Entered By: Linton Ham on 01/19/2021 09:38:07 -------------------------------------------------------------------------------- Physician Orders Details Patient Name: Date of Service: MA [name] NDA E. 01/19/2021 9:45 A M Medical Record Number: [id number] Patient Account Number: [id number] Date of Birth/Sex: Treating RN: 11/18/1970 (50 y.o. [name], [name] Primary Care Provider: Benito [name] Other Clinician: Referring Provider: Treating Provider/Extender: [name] in Treatment: 8 Verbal / Phone Orders: No Diagnosis Coding Follow-up Appointments Other: - No need to follow up!!:) Call us if you have any future concerns or questions!!:) Discharge From [facility] Services Discharge from [place] Signature(s) Signed: 01/19/2021 4:29:11 PM By:  Linton Ham MD Signed: 01/20/2021 6:22:58 PM By: Teresa Hammock RN Entered By: [name] on 01/19/2021 09:32:20 -------------------------------------------------------------------------------- Problem List Details Patient Name: Date of Service: MA [name] NDA E. 01/19/2021 9:45 A M Medical Record Number: [id number] Patient Account Number: [id number] Date of Birth/Sex: Treating RN: [birth year] (50 y.o. 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 01/19/2021 9:45 A M Medical Record Number: [id number] Patient Account Number: [id number] Date of Birth/Sex: Treating RN: May 29, 1971 (50 y.o. Benjaman Lobe Primary Care Provider: Benito [name] Other Clinician: Referring Provider: Treating Provider/Extender: [name] in Treatment: 8 Subjective History of Present Illness (HPI) [name] is a 50 year old female with past medical history of insulin-dependent type 2 diabetes, previous left pontine CVA, essential hypertension that presents to the clinic today for evaluation of her right foot wound. She states that in December  2021 she had cut open her right great toe on a door step . It became infected and she was treated with antibiotics. She started following with podiatry  in February and has been using Betadine daily on the wound. She is also using a surgical shoe to help with offloading. She has diabetic neuropathy and does not report pain to the wound. She denies any purulent drainage, fever/chills or increased warmth or erythema to the foot. 4/21; patient has been using silver alginate every other day with dressing changes. She denies any purulent drainage, fever/chills or increased warmth or erythema to the foot. She does however notice an odor. 4/26; patient presents for 1 week follow-up. She continues to use silver alginate every other day without any issues. She denies purulent drainage, fever/chills or increased warmth or erythema to the foot. She does not notice an odor anymore. She has no complaints today. 5/3; patient presents for 1 week follow-up. She states that she has an MRI scheduled for Friday. She has been using silver alginate every other day to the wound. She has noted more maceration. She denies any pain or acute signs of infection. 5/10; patient presents for 1 week follow-up. Her MRI had to be rescheduled due to needing a prior authorization. It is scheduled for 5/19. Patient has been using Hydrofera Blue every other day. She denies acute signs of infection. She has no complaints today. 5/17; patient presents for 1 week follow-up. She has an MRI scheduled on 5/19. She has been using silver alginate every other day to the wound. She states that after she took a shower she was able to remove part of the hardened callus to the tip of her toe. She denies signs of infection. 5/24; patient presents for 1 week follow-up. She had the MRI done. She is ready to do the cast today. She denies signs of infection. 5/27; patient presents for first cast change. She denies any issues for the past couple days while  using the cast. She overall feels well. She denies signs of infection. 5/31; patient presents for cast change. She does have a small open wound to the anterior shin due to the cast Rubbing against it. Overall she feels well and denies signs of infection. 6/7; patient arrives for review of wound and a total contact cast change. Right first toe tip. She arrived with callus and dry skin on the wound. Removing this reveals only a small area that is open. This should be closed by next week. She tells me she is going to go to podiatry after closure for foot wear and insert review although I cautioned her we may need at least something to transition into her normal footwear in the interim i.e. a insole and perhaps a callus pad for this toe. We are using silver alginate currently as the primary dressing 6/14; the patient's wound on the tip of her right great toe is healed. Since last week she had a problem with her left foot and she is waiting to have an MRI. She has not been walking much therefore. She has a reasonably new Luking running shoe to put on her right foot but nothing to pad the tip of the right great toe Objective Constitutional Sitting or standing Blood Pressure is within target range for patient.. Pulse regular and within target range for patient.Marland Kitchen Respirations regular, non-labored and within target range.. Temperature is normal and within the target range for the patient.Marland Kitchen Appears in no distress. Vitals Time Taken: 9:16 AM, Height: 69 in, Weight: 250 lbs, BMI: 36.9, Temperature: 97.9 F, Pulse: 79 bpm, Respiratory Rate: 20 breaths/min, Blood Pressure: 134/76 mmHg. General Notes: Wound exam; the tip of her  right great toe with a hammer deformity at the interphalangeal joint. There is no open area here some callus. No debridement necessary Integumentary (Hair, Skin) Wound #1 status is Open. Original cause of wound was Puncture. The date acquired was: 07/09/2020. The wound has been in  treatment 8 weeks. The wound is located on the Right T Great. The wound measures 0cm length x 0cm width x 0cm depth; 0cm^2 area and 0cm^3 volume. 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 The wound is closed 2. I have asked the patient to get an insole such as Dr. Felicie Morn for her shoe as well as a callus pad for the tip of her right great toe. Especially concerning given her recent injury to the left foot and we talked about this as well. Otherwise she can be discharged from the clinic Electronic Signature(s) Signed: 01/19/2021 4:29:11 PM By: Linton Ham MD Entered By: Linton Ham on 01/19/2021 09:38:49 -------------------------------------------------------------------------------- SuperBill Details Patient Name: Date of Service: MA [name] NDA E. 01/19/2021 Medical Record Number: [id number] Patient Account Number: [id number] Date of Birth/Sex: Treating RN: 21-Apr-1971 (50 y.o. 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 I63.9 Cerebral infarction, unspecified I10 Essential (primary) hypertension Facility Procedures CPT4 Code: [id number] Description: 99213 - WOUND CARE VISIT-LEV 3 EST PT Modifier: Quantity: 1 Physician Procedures : CPT4 Code Description Modifier [id number] - WC PHYS  LEVEL 2 - EST PT ICD-10 Diagnosis Description L97.519 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

## 2021-01-20 NOTE — Progress Notes (Signed)
[name], [name] ([id number]) Visit Report for 01/19/2021 Arrival Information Details Patient Name: Date of Service: Teresa [name] NDA E. 01/19/2021 9:45 A M Medical Record Number: [id number] Patient Account Number: [id number] Date of Birth/Sex: Treating RN: 1970/09/27 (50 y.o. [name], [name] Primary Care Brynnlee Cumpian: [name] Other Clinician: Referring Naji Mehringer: Treating Rosaleen Mazer/Extender: Loma Sender in Treatment: 8 Visit Information History Since Last Visit All ordered tests and consults were completed: No Patient Arrived: Wheel Chair Added or deleted any medications: No Arrival Time: 09:14 Any new allergies or adverse reactions: No Accompanied By: daughter Had a fall or experienced change in No Transfer Assistance: Manual activities of daily living that may affect Patient Identification Verified: Yes risk of falls: Secondary Verification Process Completed: Yes Signs or symptoms of abuse/neglect since last visito No Patient Requires Transmission-Based Precautions: No Hospitalized since last visit: No Patient Has Alerts: No Implantable device outside of the clinic excluding No cellular tissue based products placed in the center since last visit: Has Dressing in Place as Prescribed: Yes Pain Present Now: No Electronic Signature(s) Signed: 01/19/2021 4:42:14 PM By: Sandre Kitty Entered By: Sandre Kitty on 01/19/2021 09:16:37 -------------------------------------------------------------------------------- Clinic Level of Care Assessment Details Patient Name: Date of Service: Teresa [name] NDA E. 01/19/2021 9:45 A M Medical Record Number: [id number] Patient Account Number: [id number] Date of Birth/Sex: Treating RN: 21-May-1971 (50 y.o. [name], [name] Primary Care Mong Neal: [name] Other Clinician: Referring Waldine Zenz: Treating Treyon Wymore/Extender: Loma Sender in Treatment: 8 Clinic  Level of Care Assessment Items TOOL 4 Quantity Score X- 1 0 Use when only an EandM is performed on FOLLOW-UP visit ASSESSMENTS - Nursing Assessment / Reassessment X- 1 10 Reassessment of Co-morbidities (includes updates in patient status) X- 1 5 Reassessment of Adherence to Treatment Plan ASSESSMENTS - Wound and Skin A ssessment / Reassessment X - Simple Wound Assessment / Reassessment - one wound 1 5 '[]'$  - 0 Complex Wound Assessment / Reassessment - multiple wounds '[]'$  - 0 Dermatologic / Skin Assessment (not related to wound area) ASSESSMENTS - Focused Assessment '[]'$  - 0 Circumferential Edema Measurements - multi extremities '[]'$  - 0 Nutritional Assessment / Counseling / Intervention '[]'$  - 0 Lower Extremity Assessment (monofilament, tuning fork, pulses) '[]'$  - 0 Peripheral Arterial Disease Assessment (using hand held doppler) ASSESSMENTS - Ostomy and/or Continence Assessment and Care '[]'$  - 0 Incontinence Assessment and Management '[]'$  - 0 Ostomy Care Assessment and Management (repouching, etc.) PROCESS - Coordination of Care X - Simple Patient / Family Education for ongoing care 1 15 '[]'$  - 0 Complex (extensive) Patient / Family Education for ongoing care X- 1 10 Staff obtains Programmer, systems, Records, T Results / Process Orders est '[]'$  - 0 Staff telephones HHA, Nursing Homes / Clarify orders / etc '[]'$  - 0 Routine Transfer to another Facility (non-emergent condition) '[]'$  - 0 Routine Hospital Admission (non-emergent condition) '[]'$  - 0 New Admissions / Biomedical engineer / Ordering NPWT Apligraf, etc. , '[]'$  - 0 Emergency Hospital Admission (emergent condition) X- 1 10 Simple Discharge Coordination '[]'$  - 0 Complex (extensive) Discharge Coordination PROCESS - Special Needs '[]'$  - 0 Pediatric / Minor Patient Management '[]'$  - 0 Isolation Patient Management '[]'$  - 0 Hearing / Language / Visual special needs '[]'$  - 0 Assessment of Community assistance (transportation, D/C planning, etc.) '[]'$   - 0 Additional assistance / Altered mentation '[]'$  - 0 Support Surface(s) Assessment (bed, cushion, seat, etc.) INTERVENTIONS - Wound Cleansing / Measurement X - Simple Wound Cleansing - one wound  1 5 '[]'$  - 0 Complex Wound Cleansing - multiple wounds X- 1 5 Wound Imaging (photographs - any number of wounds) '[]'$  - 0 Wound Tracing (instead of photographs) X- 1 5 Simple Wound Measurement - one wound '[]'$  - 0 Complex Wound Measurement - multiple wounds INTERVENTIONS - Wound Dressings X - Small Wound Dressing one or multiple wounds 1 10 '[]'$  - 0 Medium Wound Dressing one or multiple wounds '[]'$  - 0 Large Wound Dressing one or multiple wounds '[]'$  - 0 Application of Medications - topical '[]'$  - 0 Application of Medications - injection INTERVENTIONS - Miscellaneous '[]'$  - 0 External ear exam '[]'$  - 0 Specimen Collection (cultures, biopsies, blood, body fluids, etc.) '[]'$  - 0 Specimen(s) / Culture(s) sent or taken to Lab for analysis '[]'$  - 0 Patient Transfer (multiple staff / Civil Service fast streamer / Similar devices) '[]'$  - 0 Simple Staple / Suture removal (25 or less) '[]'$  - 0 Complex Staple / Suture removal (26 or more) '[]'$  - 0 Hypo / Hyperglycemic Management (close monitor of Blood Glucose) '[]'$  - 0 Ankle / Brachial Index (ABI) - do not check if billed separately X- 1 5 Vital Signs Has the patient been seen at the hospital within the last three years: Yes Total Score: 85 Level Of Care: New/Established - Level 3 Electronic Signature(s) Signed: 01/20/2021 6:22:58 PM By: Rhae Hammock RN Entered By: Rhae Hammock on 01/19/2021 09:33:28 -------------------------------------------------------------------------------- Encounter Discharge Information Details Patient Name: Date of Service: Teresa [name] NDA E. 01/19/2021 9:45 A M Medical Record Number: [id number] Patient Account Number: [id number] Date of Birth/Sex: Treating RN: 05/11/1971 (50 y.o. [name], [name] Primary Care Jovany Disano: [name]  Other Clinician: Referring Marilu Rylander: Treating Fernandez Kenley/Extender: Loma Sender in Treatment: 8 Encounter Discharge Information Items Discharge Condition: Stable Ambulatory Status: Crutches Discharge Destination: Home Transportation: Private Auto Accompanied By: self Schedule Follow-up Appointment: No Clinical Summary of Care: Electronic Signature(s) Signed: 01/19/2021 6:05:55 PM By: Deon Pilling Entered By: Deon Pilling on 01/19/2021 12:45:55 -------------------------------------------------------------------------------- Lower Extremity Assessment Details Patient Name: Date of Service: Teresa [name] NDA E. 01/19/2021 9:45 A M Medical Record Number: [id number] Patient Account Number: [id number] Date of Birth/Sex: Treating RN: 09-25-1970 (50 y.o. [name], [name] Primary Care Anthoney Sheppard: [name] Other Clinician: Referring Willaim Mode: Treating Holland Nickson/Extender: Loma Sender in Treatment: 8 Edema Assessment Assessed: [name]: No] [name]: Yes] Edema: [Left: N] [Right: o] Calf Left: Right: Point of Measurement: From Medial Instep 37.5 cm Ankle Left: Right: Point of Measurement: From Medial Instep 22.3 cm Vascular Assessment Pulses: Dorsalis Pedis Palpable: [Right:Yes] Posterior Tibial Palpable: [Right:Yes] Electronic Signature(s) Signed: 01/20/2021 6:22:58 PM By: Rhae Hammock RN Entered By: Rhae Hammock on 01/19/2021 09:31:22 -------------------------------------------------------------------------------- Multi Wound Chart Details Patient Name: Date of Service: Teresa [name] NDA E. 01/19/2021 9:45 A M Medical Record Number: [id number] Patient Account Number: [id number] Date of Birth/Sex: Treating RN: 12-27-1970 (50 y.o. [name], [name] Primary Care Nana Vastine: [name] Other Clinician: Referring Kerrie Timm: Treating Yancarlos Berthold/Extender: Loma Sender in  Treatment: 8 Vital Signs Height(in): 69 Pulse(bpm): 37 Weight(lbs): 250 Blood Pressure(mmHg): 134/76 Body Mass Index(BMI): 37 Temperature(F): 97.9 Respiratory Rate(breaths/min): 20 Photos: [1:No Photos Right T Great oe] [N/A:N/A N/A] Wound Location: [1:Puncture] [N/A:N/A] Wounding Event: [1:Diabetic Wound/Ulcer of the Lower] [N/A:N/A] Primary Etiology: [1:Extremity 07/09/2020] [N/A:N/A] Date Acquired: [1:8] [N/A:N/A] Weeks of Treatment: [1:Open] [N/A:N/A] Wound Status: [1:0x0x0] [N/A:N/A] Measurements L x W x D (cm) [1:0] [N/A:N/A] A (cm) : rea [1:0] [N/A:N/A] Volume (cm) : [1:100.00%] [N/A:N/A] % Reduction in  Area: [1:100.00%] [N/A:N/A] % Reduction in Volume: [1:Grade 2] [N/A:N/A] Treatment Notes Electronic Signature(s) Signed: 01/19/2021 4:29:11 PM By: Linton Ham MD Signed: 01/20/2021 6:22:58 PM By: Rhae Hammock RN Entered By: Linton Ham on 01/19/2021 09:35:56 -------------------------------------------------------------------------------- Multi-Disciplinary Care Plan Details Patient Name: Date of Service: Teresa [name] NDA E. 01/19/2021 9:45 A M Medical Record Number: [id number] Patient Account Number: [id number] Date of Birth/Sex: Treating RN: [birth year] (50 y.o. [name], [name] Primary Care Adyline Huberty: [name] Other Clinician: Referring Abygail Galeno: Treating Saladin Petrelli/Extender: Loma Sender in Treatment: 8 Multidisciplinary Care Plan reviewed with physician Active Inactive Electronic Signature(s) Signed: 01/20/2021 6:22:58 PM By: Rhae Hammock RN Entered By: Rhae Hammock on 01/19/2021 09:32:44 -------------------------------------------------------------------------------- Pain Assessment Details Patient Name: Date of Service: Teresa [name] NDA E. 01/19/2021 9:45 A M Medical Record Number: [id number] Patient Account Number: [id number] Date of Birth/Sex: Treating RN: Sep 12, 1970 (50 y.o. [name],  [name] Primary Care Aura Bibby: [name] Other Clinician: Referring Bruce Mayers: Treating Garan Frappier/Extender: Loma Sender in Treatment: 8 Active Problems Location of Pain Severity and Description of Pain Patient Has Paino No Site Locations Pain Management and Medication Current Pain Management: Electronic Signature(s) Signed: 01/19/2021 4:42:14 PM By: Sandre Kitty Signed: 01/20/2021 6:22:58 PM By: Rhae Hammock RN Entered By: Sandre Kitty on 01/19/2021 09:17:00 -------------------------------------------------------------------------------- Patient/Caregiver Education Details Patient Name: Date of Service: Teresa [name] NDA E. 6/14/2022andnbsp9:45 A M Medical Record Number: [id number] Patient Account Number: [id number] Date of Birth/Gender: Treating RN: 1971/04/13 (50 y.o. [name], [name] Primary Care Physician: [name] Other Clinician: Referring Physician: Treating Physician/Extender: Loma Sender in Treatment: 8 Education Assessment Education Provided To: Patient Education Topics Provided Wound/Skin Impairment: Methods: Explain/Verbal Responses: State content correctly Electronic Signature(s) Signed: 01/20/2021 6:22:58 PM By: Rhae Hammock RN Entered By: Rhae Hammock on 01/19/2021 09:32:56 -------------------------------------------------------------------------------- Wound Assessment Details Patient Name: Date of Service: Teresa [name] NDA E. 01/19/2021 9:45 A M Medical Record Number: [id number] Patient Account Number: [id number] Date of Birth/Sex: Treating RN: [birth year] (50 y.o. [name], [name] Primary Care Cherri Yera: [name] Other Clinician: Referring Edris Schneck: Treating Daria Mcmeekin/Extender: Loma Sender in Treatment: 8 Wound Status Wound Number: 1 Primary Diabetic Wound/Ulcer of the Lower Extremity Etiology: Wound  Location: Right T Great oe Wound Healed - Epithelialized Wounding Event: Puncture Status: Date Acquired: 07/09/2020 Comorbid Anemia, Asthma, Sleep Apnea, Coronary Artery Disease, Weeks Of Treatment: 8 History: Hypertension, Type II Diabetes, Osteoarthritis, Neuropathy, Clustered Wound: No Confinement Anxiety Photos Wound Measurements Length: (cm) Width: (cm) Depth: (cm) Area: (cm) Volume: (cm) 0 % Reduction in Area: 100% 0 % Reduction in Volume: 100% 0 Epithelialization: Large (67-100%) 0 0 Wound Description Classification: Grade 2 Wound Margin: Indistinct, nonvisible Exudate Amount: Small Exudate Type: Serosanguineous Exudate Color: red, brown Wound Bed Granulation Amount: None Present (0%) Necrotic Amount: None Present (0%) Foul Odor After Cleansing: No Slough/Fibrino No Exposed Structure Fascia Exposed: No Fat Layer (Subcutaneous Tissue) Exposed: No Tendon Exposed: No Muscle Exposed: No Joint Exposed: No Bone Exposed: No Limited to Skin Breakdown Electronic Signature(s) Signed: 01/19/2021 4:42:14 PM By: Sandre Kitty Signed: 01/20/2021 6:22:58 PM By: Rhae Hammock RN Entered By: Sandre Kitty on 01/19/2021 16:32:46 -------------------------------------------------------------------------------- Vitals Details Patient Name: Date of Service: Teresa [name] NDA E. 01/19/2021 9:45 A M Medical Record Number: [id number] Patient Account Number: [id number] Date of Birth/Sex: Treating RN: June 22, 1971 (50 y.o. [name] Primary Care Malasia Torain: [name] Other Clinician: Referring Laquana Villari: Treating Jaimon Bugaj/Extender: Loma Sender in Treatment: 8 Vital Signs  Time Taken: 09:16 Temperature (F): 97.9 Height (in): 69 Pulse (bpm): 79 Weight (lbs): 250 Respiratory Rate (breaths/min): 20 Body Mass Index (BMI): 36.9 Blood Pressure (mmHg): 134/76 Reference Range: 80 - 120 mg / dl Electronic Signature(s) Signed:  01/19/2021 4:42:14 PM By: Sandre Kitty Entered By: Sandre Kitty on 01/19/2021 09:16:52

## 2021-02-04 ENCOUNTER — Ambulatory Visit
Admission: RE | Admit: 2021-02-04 | Discharge: 2021-02-04 | Disposition: A | Discharge status: Home / Self Care | Payer: Medicaid Other | Source: Ambulatory Visit | Attending: Podiatry | Admitting: Podiatry

## 2021-02-04 ENCOUNTER — Other Ambulatory Visit: Payer: Self-pay

## 2021-02-04 DIAGNOSIS — M7662 Achilles tendinitis, left leg: Secondary | ICD-10-CM

## 2021-02-10 ENCOUNTER — Telehealth: Payer: Self-pay | Admitting: Urology

## 2021-02-10 ENCOUNTER — Other Ambulatory Visit: Payer: Self-pay

## 2021-02-10 ENCOUNTER — Ambulatory Visit: Payer: Medicaid Other | Admitting: Podiatry

## 2021-02-10 DIAGNOSIS — M216X2 Other acquired deformities of left foot: Secondary | ICD-10-CM

## 2021-02-10 DIAGNOSIS — S86012A Strain of left Achilles tendon, initial encounter: Secondary | ICD-10-CM | POA: Diagnosis not present

## 2021-02-10 DIAGNOSIS — Z01818 Encounter for other preprocedural examination: Secondary | ICD-10-CM | POA: Diagnosis not present

## 2021-02-10 DIAGNOSIS — Z794 Long term (current) use of insulin: Secondary | ICD-10-CM

## 2021-02-10 DIAGNOSIS — M21862 Other specified acquired deformities of left lower leg: Secondary | ICD-10-CM

## 2021-02-10 DIAGNOSIS — E1142 Type 2 diabetes mellitus with diabetic polyneuropathy: Secondary | ICD-10-CM | POA: Diagnosis not present

## 2021-02-10 NOTE — Telephone Encounter (Signed)
DOS - 02/19/21  ACHILLES REPAIR - [id number] GASTROCNEMIUS RECESS LEFT --- [id number]   HEALTHY BLUE EFFECTIVE DATE - 02/06/20   PLAN DEDUCTIBLE - $0.00 OUT OF POCKET - $0.00 COINSURANCE - 0% COPAY - $3.00   PER [employer] WEB SITE FOR CPT CODES 29562 AND 13086 NO PRIOR AUTH IS REQUIRED.  TRACKING ID # [id number]

## 2021-02-11 ENCOUNTER — Encounter: Payer: Self-pay | Admitting: Podiatry

## 2021-02-11 NOTE — H&P (View-Only) (Signed)
Subjective:  Patient ID: [name], female    DOB: Jul 08, 1971,  MRN: [id number]  Chief Complaint  Patient presents with   Foot Pain    Left foot pain    50 y.o. female presents with the above complaint.  Patient presents with follow-up to left posterior Achilles tendon tear.  She states that she is still is hurting a lot.  She has been nonweightbearing for to the lower extremity.  She would like to discuss treatment options she had done her MRI which showed acute tearing of the tendon to near complete.   Review of Systems: Negative except as noted in the HPI. Denies N/V/F/Ch.  Past Medical History:  Diagnosis Date   Arthritis    Asthma    Cataract    Mixed form OU   CKD (chronic kidney disease)    Coronary artery disease    Diabetes mellitus    Diabetic retinopathy (HCC)    NPDR OU   Hyperlipidemia    Hypertension    Hypertensive retinopathy    OU   Left thyroid nodule    diagnosed 07/2018   Pseudotumor cerebri    Stroke [facility])    Vitamin D deficiency     Current Outpatient Medications:    Accu-Chek FastClix Lancets MISC, 4 (four) times daily., Disp: , Rfl:    ACCU-CHEK GUIDE test strip, 1 each by Other route See admin instructions. for testing, Disp: 100 each, Rfl: 2   albuterol (VENTOLIN HFA) 108 (90 Base) MCG/ACT inhaler, Inhale into the lungs every 6 (six) hours as needed for wheezing or shortness of breath., Disp: , Rfl:    amLODipine-benazepril (LOTREL) 10-40 MG capsule, Take 1 capsule by mouth daily., Disp: 90 capsule, Rfl: 2   aspirin 81 MG EC tablet, Take 1 tablet (81 mg total) by mouth daily., Disp: 90 tablet, Rfl: 2   blood glucose meter kit and supplies KIT, Dispense based on patient and insurance preference. Use up to four times daily as directed. (FOR ICD-9 250.00, 250.01)., Disp: 1 each, Rfl: 0   carvedilol (COREG) 25 MG tablet, Take 1 tablet (25 mg total) by mouth 2 (two) times daily with a meal., Disp: 180 tablet, Rfl: 2   Cholecalciferol (VITAMIN  D3) 25 MCG (1000 UT) CAPS, Take 1 capsule by mouth daily., Disp: , Rfl:    folic acid (FOLVITE) 1 MG tablet, Take 1 mg by mouth daily., Disp: , Rfl:    furosemide (LASIX) 20 MG tablet, Take 2 tablets (40 mg total) by mouth 2 (two) times daily. Take in the morning and afternoon, Disp: 90 tablet, Rfl: 3   glipiZIDE (GLUCOTROL) 10 MG tablet, Take 10 mg by mouth daily., Disp: , Rfl:    insulin aspart protamine- aspart (NOVOLOG MIX 70/30) (70-30) 100 UNIT/ML injection, Inject 0.15 mLs (15 Units total) into the skin 2 (two) times daily with a meal. (Patient taking differently: Inject 15-17 Units into the skin See admin instructions. Inject 15 units subcutaneous in the morning, then inject 17 units subcutaneous in the evening), Disp: 10 mL, Rfl: 11   Insulin Syringes, Disposable, U-100 1 ML MISC, 1 application by Does not apply route 2 (two) times a day., Disp: 100 each, Rfl: 0   ipratropium-albuterol (DUONEB) 0.5-2.5 (3) MG/3ML SOLN, Take 3 mLs by nebulization every 4 (four) hours as needed., Disp: 360 mL, Rfl: 0   isosorbide-hydrALAZINE (BIDIL) 20-37.5 MG tablet, Take 2 tablets by mouth 3 (three) times daily., Disp: 540 tablet, Rfl: 3   metFORMIN (GLUCOPHAGE)  1000 MG tablet, Take 1,000 mg by mouth 2 (two) times daily., Disp: , Rfl:    rosuvastatin (CRESTOR) 10 MG tablet, Take 1 tablet (10 mg total) by mouth daily., Disp: 90 tablet, Rfl: 3   vitamin B-12 (CYANOCOBALAMIN) 1000 MCG tablet, Take 1,000 mcg by mouth daily., Disp: , Rfl:   Social History   Tobacco Use  Smoking Status Former   Packs/day: 0.50   Years: 30.00   Pack years: 15.00   Types: Cigarettes   Quit date: 2017   Years since quitting: 5.5  Smokeless Tobacco Never    No Known Allergies Objective:  There were no vitals filed for this visit. There is no height or weight on file to calculate BMI. Constitutional Well developed. Well nourished.  Vascular Dorsalis pedis pulses palpable bilaterally. Posterior tibial pulses palpable  bilaterally. Capillary refill normal to all digits.  No cyanosis or clubbing noted. Pedal hair growth normal.  Neurologic Normal speech. Oriented to person, place, and time. Epicritic sensation to light touch grossly present bilaterally.  Dermatologic Nails well groomed and normal in appearance. No open wounds. No skin lesions.  Orthopedic: Pain on palpation along the course of the Achilles tendon at the mid substance level.  No pain at the insertion of the Achilles tendon.  Mild hatchet strike defect noted.  Achilles tendon no plantarflexion noted.  Positive Kennon Rounds sign it appears to be about 4 out of 5 in strength.  Negative calf pain.  Positive Silfverskiold test with gastrocnemius equinus.   Radiographs:. Diffuse soft tissue swelling. 2. No acute bone abnormality.  The tendon is markedly thickened and edematous consistent with severe tendinopathy. There is a high-grade, near complete tear of the tendon approximately 1.7 cm above its insertion on the calcaneus. Gap between tendon fragments is approximately 2 cm. The tear involves approximately the anterior 70% of the tendon and is eccentrically worse toward the lateral side of the tendon. Assessment:   1. Type 2 diabetes mellitus with diabetic polyneuropathy, with long-term current use of insulin ([place])   2. Achilles tendon tear, left, initial encounter   3. Gastrocnemius equinus, left   4. Preoperative examination    Plan:  Patient was evaluated and treated and all questions answered.  Left Achilles tear with underlying gastrocnemius equinus -I explained the patient the etiology of Achilles tendinitis and various treatment options were discussed.   -MRI was reviewed with the patient in extensive detail.  There appears to be a tear of the tendon near complete 1.7 cm above its insertion.  There is a gap between the tendon fragments approximately 2 cm.  The tear involves anterior 70% of 10 is eccentrically worse towards the lateral  side of the tendon.  I discussed that given the tearing of the tendon patient will benefit from surgical intervention with primary repair of the Achilles tendon with gastrocnemius recession to provide the lengthening.  I discussed this with the patient in extensive detail she had a similar procedure done in the past on the contralateral side to the right side.  I believe she will benefit from the above listed procedure.  She will be nonweightbearing to the right lower extremity.  I encouraged her to get knee scooter.  She states understanding. -Informed surgical risk consent was reviewed and read aloud to the patient.  I reviewed the films.  I have discussed my findings with the patient in great detail.  I have discussed all risks including but not limited to infection, stiffness, scarring, limp, disability, deformity, damage  to blood vessels and nerves, numbness, poor healing, need for braces, arthritis, chronic pain, amputation, death.  All benefits and realistic expectations discussed in great detail.  I have made no promises as to the outcome.  I have provided realistic expectations.  I have offered the patient a 2nd opinion, which they have declined and assured me they preferred to proceed despite the risks -Patient is a diabetic with her recent A1c being between 6.5 to 7%.  I did give her another A1c to be drawn as the previous one was done about a year ago.  She states she had it done recently but it may not have been with home.  She denies any other acute complaints I discussed with her that given that she is a diabetic she is a high risk of wound complication.  She states understanding.  Given the acute nature of the injury she will need the surgery to prevent loss of plantarflexion and foot drop   Right toe ulcer secondary to diabetes -Being primarily managed by the wound care center and is currently total contact cast  No follow-ups on file.

## 2021-02-11 NOTE — Progress Notes (Signed)
Subjective:  Patient ID: [name], female    DOB: Jul 08, 1971,  MRN: [id number]  Chief Complaint  Patient presents with   Foot Pain    Left foot pain    50 y.o. female presents with the above complaint.  Patient presents with follow-up to left posterior Achilles tendon tear.  She states that she is still is hurting a lot.  She has been nonweightbearing for to the lower extremity.  She would like to discuss treatment options she had done her MRI which showed acute tearing of the tendon to near complete.   Review of Systems: Negative except as noted in the HPI. Denies N/V/F/Ch.  Past Medical History:  Diagnosis Date   Arthritis    Asthma    Cataract    Mixed form OU   CKD (chronic kidney disease)    Coronary artery disease    Diabetes mellitus    Diabetic retinopathy (HCC)    NPDR OU   Hyperlipidemia    Hypertension    Hypertensive retinopathy    OU   Left thyroid nodule    diagnosed 07/2018   Pseudotumor cerebri    Stroke [facility])    Vitamin D deficiency     Current Outpatient Medications:    Accu-Chek FastClix Lancets MISC, 4 (four) times daily., Disp: , Rfl:    ACCU-CHEK GUIDE test strip, 1 each by Other route See admin instructions. for testing, Disp: 100 each, Rfl: 2   albuterol (VENTOLIN HFA) 108 (90 Base) MCG/ACT inhaler, Inhale into the lungs every 6 (six) hours as needed for wheezing or shortness of breath., Disp: , Rfl:    amLODipine-benazepril (LOTREL) 10-40 MG capsule, Take 1 capsule by mouth daily., Disp: 90 capsule, Rfl: 2   aspirin 81 MG EC tablet, Take 1 tablet (81 mg total) by mouth daily., Disp: 90 tablet, Rfl: 2   blood glucose meter kit and supplies KIT, Dispense based on patient and insurance preference. Use up to four times daily as directed. (FOR ICD-9 250.00, 250.01)., Disp: 1 each, Rfl: 0   carvedilol (COREG) 25 MG tablet, Take 1 tablet (25 mg total) by mouth 2 (two) times daily with a meal., Disp: 180 tablet, Rfl: 2   Cholecalciferol (VITAMIN  D3) 25 MCG (1000 UT) CAPS, Take 1 capsule by mouth daily., Disp: , Rfl:    folic acid (FOLVITE) 1 MG tablet, Take 1 mg by mouth daily., Disp: , Rfl:    furosemide (LASIX) 20 MG tablet, Take 2 tablets (40 mg total) by mouth 2 (two) times daily. Take in the morning and afternoon, Disp: 90 tablet, Rfl: 3   glipiZIDE (GLUCOTROL) 10 MG tablet, Take 10 mg by mouth daily., Disp: , Rfl:    insulin aspart protamine- aspart (NOVOLOG MIX 70/30) (70-30) 100 UNIT/ML injection, Inject 0.15 mLs (15 Units total) into the skin 2 (two) times daily with a meal. (Patient taking differently: Inject 15-17 Units into the skin See admin instructions. Inject 15 units subcutaneous in the morning, then inject 17 units subcutaneous in the evening), Disp: 10 mL, Rfl: 11   Insulin Syringes, Disposable, U-100 1 ML MISC, 1 application by Does not apply route 2 (two) times a day., Disp: 100 each, Rfl: 0   ipratropium-albuterol (DUONEB) 0.5-2.5 (3) MG/3ML SOLN, Take 3 mLs by nebulization every 4 (four) hours as needed., Disp: 360 mL, Rfl: 0   isosorbide-hydrALAZINE (BIDIL) 20-37.5 MG tablet, Take 2 tablets by mouth 3 (three) times daily., Disp: 540 tablet, Rfl: 3   metFORMIN (GLUCOPHAGE)  1000 MG tablet, Take 1,000 mg by mouth 2 (two) times daily., Disp: , Rfl:    rosuvastatin (CRESTOR) 10 MG tablet, Take 1 tablet (10 mg total) by mouth daily., Disp: 90 tablet, Rfl: 3   vitamin B-12 (CYANOCOBALAMIN) 1000 MCG tablet, Take 1,000 mcg by mouth daily., Disp: , Rfl:   Social History   Tobacco Use  Smoking Status Former   Packs/day: 0.50   Years: 30.00   Pack years: 15.00   Types: Cigarettes   Quit date: 2017   Years since quitting: 5.5  Smokeless Tobacco Never    No Known Allergies Objective:  There were no vitals filed for this visit. There is no height or weight on file to calculate BMI. Constitutional Well developed. Well nourished.  Vascular Dorsalis pedis pulses palpable bilaterally. Posterior tibial pulses palpable  bilaterally. Capillary refill normal to all digits.  No cyanosis or clubbing noted. Pedal hair growth normal.  Neurologic Normal speech. Oriented to person, place, and time. Epicritic sensation to light touch grossly present bilaterally.  Dermatologic Nails well groomed and normal in appearance. No open wounds. No skin lesions.  Orthopedic: Pain on palpation along the course of the Achilles tendon at the mid substance level.  No pain at the insertion of the Achilles tendon.  Mild hatchet strike defect noted.  Achilles tendon no plantarflexion noted.  Positive Kennon Rounds sign it appears to be about 4 out of 5 in strength.  Negative calf pain.  Positive Silfverskiold test with gastrocnemius equinus.   Radiographs:. Diffuse soft tissue swelling. 2. No acute bone abnormality.  The tendon is markedly thickened and edematous consistent with severe tendinopathy. There is a high-grade, near complete tear of the tendon approximately 1.7 cm above its insertion on the calcaneus. Gap between tendon fragments is approximately 2 cm. The tear involves approximately the anterior 70% of the tendon and is eccentrically worse toward the lateral side of the tendon. Assessment:   1. Type 2 diabetes mellitus with diabetic polyneuropathy, with long-term current use of insulin ([place])   2. Achilles tendon tear, left, initial encounter   3. Gastrocnemius equinus, left   4. Preoperative examination    Plan:  Patient was evaluated and treated and all questions answered.  Left Achilles tear with underlying gastrocnemius equinus -I explained the patient the etiology of Achilles tendinitis and various treatment options were discussed.   -MRI was reviewed with the patient in extensive detail.  There appears to be a tear of the tendon near complete 1.7 cm above its insertion.  There is a gap between the tendon fragments approximately 2 cm.  The tear involves anterior 70% of 10 is eccentrically worse towards the lateral  side of the tendon.  I discussed that given the tearing of the tendon patient will benefit from surgical intervention with primary repair of the Achilles tendon with gastrocnemius recession to provide the lengthening.  I discussed this with the patient in extensive detail she had a similar procedure done in the past on the contralateral side to the right side.  I believe she will benefit from the above listed procedure.  She will be nonweightbearing to the right lower extremity.  I encouraged her to get knee scooter.  She states understanding. -Informed surgical risk consent was reviewed and read aloud to the patient.  I reviewed the films.  I have discussed my findings with the patient in great detail.  I have discussed all risks including but not limited to infection, stiffness, scarring, limp, disability, deformity, damage  to blood vessels and nerves, numbness, poor healing, need for braces, arthritis, chronic pain, amputation, death.  All benefits and realistic expectations discussed in great detail.  I have made no promises as to the outcome.  I have provided realistic expectations.  I have offered the patient a 2nd opinion, which they have declined and assured me they preferred to proceed despite the risks -Patient is a diabetic with her recent A1c being between 6.5 to 7%.  I did give her another A1c to be drawn as the previous one was done about a year ago.  She states she had it done recently but it may not have been with home.  She denies any other acute complaints I discussed with her that given that she is a diabetic she is a high risk of wound complication.  She states understanding.  Given the acute nature of the injury she will need the surgery to prevent loss of plantarflexion and foot drop   Right toe ulcer secondary to diabetes -Being primarily managed by the wound care center and is currently total contact cast  No follow-ups on file.

## 2021-02-12 ENCOUNTER — Other Ambulatory Visit: Payer: Self-pay | Admitting: Podiatry

## 2021-02-12 NOTE — Progress Notes (Signed)
Advanced [facility]pertension Clinic Initial Assessment:    Date:  02/15/2021   ID:  [name], DOB 06/10/1971, MRN [id number]  PCP:  [name], MD  Cardiologist:  None  Nephrologist:  Referring MD: Nigel Mormon, MD   CC: Hypertension  History of Present Illness:    [name] is a 50 y.o. female with a hx of hypertension, hyperlipidemia, arthritis, asthma, CKD 3, CAD, CVA, thyroid nodule (dx 07/2018),Diabetes Mellitus type 2, here to establish care in the [facility] Clinic. She was last seen by Dr.Patwardhan 11/2020 for hypertension. At her last appointment, her BP range was 180/93. Her last echo for 11/2019 revealed LVEF 60-65% and severe LVH.  Ms. Boerema wonders if her home blood pressure cuff is accurate.  She checks her bp in the morning and night but has stopped for about a month.  She does say when her blood pressure is elevated she often has headaches.  Since September, she has noted intermittent palpitations that last for about 3 minutes at a time.  There is no associated chest pain, shortness of breath, lightheadedness, or dizziness.  She recently tore her left Achilles tendon and is scheduled to have surgery on 02/19/2021.  Due to this injury she has not been getting much exercise lately.  Prior to this injury she had a nonhealing ulcer in her right foot.  This finally healed without intervention. She cooks and her daughter cooks for her. She does not use salts with meals.  She has very limited caffeine intake.  She uses her CPAP machine regularly. Patient denies chest pain, shortness of breath, abdominal pain, diarrhea, nausea, lightheadedness, LE Edema, syncope,orthopnea or PND.   Past Medical History:  Diagnosis Date   Arthritis    Asthma    Cataract    Mixed form OU   CKD (chronic kidney disease)    Coronary artery disease    Diabetes mellitus    Diabetic retinopathy ([place])    NPDR OU   Hyperlipidemia    Hypertension    Hypertensive  retinopathy    OU   Left thyroid nodule    diagnosed 07/2018   PAC (premature atrial contraction) 02/15/2021   Pseudotumor cerebri    Stroke ([place])    Vitamin D deficiency     Past Surgical History:  Procedure Laterality Date   ACHILLES TENDON REPAIR     TUBAL LIGATION      Current Medications: Current Meds  Medication Sig   Accu-Chek FastClix Lancets MISC 4 (four) times daily.   ACCU-CHEK GUIDE test strip 1 each by Other route See admin instructions. for testing   albuterol (VENTOLIN HFA) 108 (90 Base) MCG/ACT inhaler Inhale into the lungs every 6 (six) hours as needed for wheezing or shortness of breath.   amLODipine-benazepril (LOTREL) 10-40 MG capsule Take 1 capsule by mouth daily.   aspirin 81 MG EC tablet Take 1 tablet (81 mg total) by mouth daily.   blood glucose meter kit and supplies KIT Dispense based on patient and insurance preference. Use up to four times daily as directed. (FOR ICD-9 250.00, 250.01).   carvedilol (COREG) 25 MG tablet Take 1 tablet (25 mg total) by mouth 2 (two) times daily with a meal.   Cholecalciferol (VITAMIN D3) 25 MCG (1000 UT) CAPS Take 1 capsule by mouth daily.   cloNIDine (CATAPRES) 0.2 MG tablet Take 0.2 mg by mouth 2 (two) times daily.   folic acid (FOLVITE) 1 MG tablet Take 1 mg by mouth daily.  furosemide (LASIX) 20 MG tablet Take 2 tablets (40 mg total) by mouth 2 (two) times daily. Take in the morning and afternoon   glipiZIDE (GLUCOTROL) 10 MG tablet Take 10 mg by mouth daily.   hydrOXYzine (VISTARIL) 25 MG capsule Take 25 mg by mouth at bedtime.   insulin aspart protamine- aspart (NOVOLOG MIX 70/30) (70-30) 100 UNIT/ML injection Inject 0.15 mLs (15 Units total) into the skin 2 (two) times daily with a meal. (Patient taking differently: Inject 15-17 Units into the skin See admin instructions. Inject 15 units subcutaneous in the morning, then inject 17 units subcutaneous in the evening)   Insulin Syringes, Disposable, U-100 1 ML MISC 1  application by Does not apply route 2 (two) times a day.   ipratropium-albuterol (DUONEB) 0.5-2.5 (3) MG/3ML SOLN Take 3 mLs by nebulization every 4 (four) hours as needed.   isosorbide-hydrALAZINE (BIDIL) 20-37.5 MG tablet Take 2 tablets by mouth 3 (three) times daily.   metFORMIN (GLUCOPHAGE) 1000 MG tablet Take 1,000 mg by mouth 2 (two) times daily.   rosuvastatin (CRESTOR) 40 MG tablet Take 40 mg by mouth daily.   vitamin B-12 (CYANOCOBALAMIN) 1000 MCG tablet Take 1,000 mcg by mouth daily.     Allergies:   Patient has no known allergies.   Social History   Socioeconomic History   Marital status: Single    Spouse name: Not on file   Number of children: 4   Years of education: Not on file   Highest education level: Not on file  Occupational History   Occupation: unemployed  Tobacco Use   Smoking status: Former    Packs/day: 0.50    Years: 30.00    Pack years: 15.00    Types: Cigarettes    Quit date: 2017    Years since quitting: 5.5   Smokeless tobacco: Never  Vaping Use   Vaping Use: Never used  Substance and Sexual Activity   Alcohol use: Not Currently    Alcohol/week: 0.0 standard drinks    Comment: rare   Drug use: Yes    Types: Marijuana    Comment: Daily   Sexual activity: Not Currently    Partners: Male    Birth control/protection: Surgical  Other Topics Concern   Not on file  Social History Narrative   Lives with 3 kids   Right handed   Drinks 2 cups caffeine daily   Social Determinants of Health   Financial Resource Strain: Not on file  Food Insecurity: Food Insecurity Present   Worried About [place] in the Last Year: Sometimes true   Ran Out of Food in the Last Year: Never true  Transportation Needs: No Transportation Needs   Lack of Transportation (Medical): No   Lack of Transportation (Non-Medical): No  Physical Activity: Inactive   Days of Exercise per Week: 0 days   Minutes of Exercise per Session: 0 min  Stress: Stress Concern  Present   Feeling of Stress : Very much  Social Connections: Moderately Isolated   Frequency of Communication with Friends and Family: Three times a week   Frequency of Social Gatherings with Friends and Family: Three times a week   Attends Religious Services: More than 4 times per year   Active Member of Clubs or Organizations: No   Attends Archivist Meetings: Never   Marital Status: Never married     Family History: The patient's family history includes Asthma in her mother, sister, and another family member; Cancer in an  other family member; Congestive Heart Failure in her maternal aunt and maternal grandmother; Diabetes in her father and maternal grandmother; Hyperlipidemia in an other family member; Hypertension in her father, sister, and another family member; Kidney disease in her father; Lung disease in her maternal grandmother.  ROS:   Please see the history of present illness.   (+) palpitation (+) headaches (+) snores (+) diaphoresis All other systems reviewed and are negative.  EKGs/Labs/Other Studies Reviewed:    06/25/2020 echo:  1. Left ventricular ejection fraction, by estimation, is 60 to 65%. The  left ventricle has normal function. The left ventricle has no regional  wall motion abnormalities. There is severe left ventricular hypertrophy.  Left ventricular diastolic parameters   were normal.   2. Right ventricular systolic function is normal. The right ventricular  size is normal. There is normal pulmonary artery systolic pressure. The  estimated right ventricular systolic pressure is 95.1 mmHg.   3. A small pericardial effusion is present.   4. The mitral valve is normal in structure. No evidence of mitral valve  regurgitation.   5. The aortic valve was not well visualized. Aortic valve regurgitation  is mild. No aortic stenosis is present.   6. The inferior vena cava is dilated in size with >50% respiratory  variability, suggesting right atrial  pressure of 8 mmHg.   10/16/2019: PCV echo Echocardiogram 10/17/2019:  Normal LV systolic function with visual EF 50-55%. Left ventricle cavity  is normal in size. Normal global wall motion. Doppler evidence of grade II  (pseudonormal) diastolic dysfunction, elevated LAP. Severe left  ventricular hypertrophy. No obvious regional wall motion abnormalities.  Calculated EF 57%.  Left atrial cavity is moderate to severely dilated.  Mild (Grade I) aortic regurgitation.  Mild to moderate mitral regurgitation.  Mild tricuspid regurgitation. RVSP measures 33 mmHg.  Mild pulmonic regurgitation.  Small pericardial effusion with no hemodynamic compromise.  Prior study dated 07/20/2018: LVEF 45-50%, Grade II diastolic dysfunction,  severely dilated LA, Mild AR, Mild MR, insignificant pericardial effusion.   09/25/2019: VAS Transcranial doppler Summary:     A vascular evaluation was performed. The right middle cerebral artery was  studied. An IV was inserted into the patient's right forearm . Verbal  informed consent was obtained.     No HITS heard during rest.  No HITS heard during valsalva.  Negative TCD Bubble study   09/17/2019 : Non-telemetry monitoring hookup Event monitor 09/17/2019 - 10/16/2019: Diagnostic time: 94% Dominant rhythm: Sinus. HR 66-112 bpm. Avg HR 80 bpm. 6 episodes of NSVT up to 9 beats seen. No atrial fibrillation/atrial flutter/SVT/high grade AV block, sinus pause >3sec noted. Symptoms reported: None  EKG:   02/15/2021:NSR.  Rate 80 bpm.  PACs.  Cannot rule out prior septal infarct.  Inferior T wave inversions.  QTC 512 ms  Recent Labs: 03/30/2020: TSH 1.470 06/24/2020: ALT 28; B Natriuretic Peptide 427.9 06/25/2020: BUN 18; Creatinine, Ser 1.49; Hemoglobin 7.7; Platelets 249; Potassium 3.5; Sodium 139   Recent Lipid Panel    Component Value Date/Time   CHOL 167 07/31/2019 0856   TRIG 287 (H) 07/31/2019 0856   HDL 39 (L) 07/31/2019 0856   CHOLHDL 4.3  07/31/2019 0856   CHOLHDL 4.9 02/12/2019 0537   VLDL 77 (H) 02/12/2019 0537   LDLCALC 81 07/31/2019 0856    Physical Exam:   VS:  BP (!) 124/58 (BP Location: Right Arm, Patient Position: Sitting)   Pulse 80   Ht _0  (1.753 m)   Wt  240 lb (108.9 kg)   LMP  (LMP Unknown)   BMI 35.44 kg/m  , BMI Body mass index is 35.44 kg/m. GENERAL:  Well appearing HEENT: Pupils equal round and reactive, fundi not visualized, oral mucosa unremarkable NECK:  No jugular venous distention, waveform within normal limits, carotid upstroke brisk and symmetric, no bruits LUNGS:  Clear to auscultation bilaterally HEART:  RRR.  PMI not displaced or sustained,S1 and S2 within normal limits, no S3, no S4, no clicks, no rubs, no murmurs ABD:  Flat, positive bowel sounds normal in frequency in pitch, no bruits, no rebound, no guarding, no midline pulsatile mass, no hepatomegaly, no splenomegaly EXT:  2 plus pulses throughout, no edema, no cyanosis no clubbing.  Left foot brace SKIN:  No rashes no nodules NEURO:  Cranial nerves II through XII grossly intact, motor grossly intact throughout PSYCH:  Cognitively intact, oriented to person place and time   ASSESSMENT/PLAN:    Essential hypertension BP better controlled since adding clonidine.  She does have resistant hypertension as she is on multiple blood pressure medications.  We will check renal artery Dopplers.  Thyroid function has been normal.  We will not refer her to the PREP exercise program given that she needs surgery on her foot.  Continue amlodipine, benazepril, BiDil, carvedilol, clonidine, and furosemide.  Continue using CPAP.  Obstructive sleep apnea Encouraged to continue CPAP use.  Mixed hyperlipidemia Continue rosuvastatin.  PAC (premature atrial contraction) Patient reported palpitations and was noted to have PACs on EKG today.  We will check a basic metabolic panel and magnesium.   Screening for Secondary Hypertension:  Causes  02/15/2021  Drugs/Herbals Screened  Renovascular HTN Screened     - Comments Check renal artery Dopplers  Sleep Apnea Screened     - Comments uses CPAP  Thyroid Disease Screened  Hyperaldosteronism Not Screened     - Comments Unable to check while on benazepril.  Given that her blood pressures not well have been controlled, we will defer this.  Pheochromocytoma Screened     - Comments palpitations in April  Cushing's Syndrome N/A  Hyperparathyroidism N/A  Coarctation of the Aorta Screened     - Comments Blood pressure symmetric  Compliance Screened    Relevant Labs/Studies: Basic Labs Latest Ref Rng & Units 06/25/2020 06/24/2020 03/30/2020  Sodium 135 - 145 mmol/L 139 139 136  Potassium 3.5 - 5.1 mmol/L 3.5 3.7 3.9  Creatinine 0.44 - 1.00 mg/dL 1.49(H) 1.60(H) 1.42(H)    Thyroid  Latest Ref Rng & Units 03/30/2020 07/31/2019  TSH 0.450 - 4.500 uIU/mL 1.470 1.380             Renovascular  02/15/2021  Renal Artery [place] Completed Yes     Disposition:    FU with MD/PharmD as needed   Medication Adjustments/Labs and Tests Ordered: Current medicines are reviewed at length with the patient today.  Concerns regarding medicines are outlined above.  Orders Placed This Encounter  Procedures   Basic metabolic panel   Magnesium   EKG 12-Lead   VAS US RENAL ARTERY DUPLEX   No orders of the defined types were placed in this encounter.   I,Jada Bradford,acting as a Education administrator for Skeet Latch, MD.,have documented all relevant documentation on the behalf of Skeet Latch, MD,as directed by  Skeet Latch, MD while in the presence of Skeet Latch, MD.  I, Carlin Oval Linsey, MD have reviewed all documentation for this visit.  The documentation of the exam, diagnosis, procedures, and orders  on 02/15/2021 are all accurate and complete.   Signed, Skeet Latch, MD  02/15/2021 5:08 PM    [place]

## 2021-02-13 LAB — HGB A1C W/O EAG: Hgb A1c MFr Bld: 6.3 % — ABNORMAL HIGH (ref 4.8–5.6)

## 2021-02-15 ENCOUNTER — Encounter (HOSPITAL_BASED_OUTPATIENT_CLINIC_OR_DEPARTMENT_OTHER): Payer: Self-pay | Admitting: Podiatry

## 2021-02-15 ENCOUNTER — Other Ambulatory Visit: Payer: Self-pay

## 2021-02-15 ENCOUNTER — Ambulatory Visit (HOSPITAL_BASED_OUTPATIENT_CLINIC_OR_DEPARTMENT_OTHER): Payer: Medicaid Other | Admitting: Cardiovascular Disease

## 2021-02-15 ENCOUNTER — Encounter (HOSPITAL_BASED_OUTPATIENT_CLINIC_OR_DEPARTMENT_OTHER): Payer: Self-pay | Admitting: Cardiovascular Disease

## 2021-02-15 VITALS — BP 124/58 | HR 80 | Ht 69.0 in | Wt 240.0 lb

## 2021-02-15 DIAGNOSIS — I10 Essential (primary) hypertension: Secondary | ICD-10-CM | POA: Diagnosis not present

## 2021-02-15 DIAGNOSIS — I493 Ventricular premature depolarization: Secondary | ICD-10-CM

## 2021-02-15 DIAGNOSIS — G4733 Obstructive sleep apnea (adult) (pediatric): Secondary | ICD-10-CM

## 2021-02-15 DIAGNOSIS — E782 Mixed hyperlipidemia: Secondary | ICD-10-CM

## 2021-02-15 DIAGNOSIS — I491 Atrial premature depolarization: Secondary | ICD-10-CM

## 2021-02-15 HISTORY — DX: Atrial premature depolarization: I49.1

## 2021-02-15 NOTE — Patient Instructions (Signed)
Medication Instructions:  Your physician recommends that you continue on your current medications as directed. Please refer to the Current Medication list given to you today.   Labwork: BMET/MAGNESIUM WHEN YOU HAVE YOUR RENAL DUPLEX   Testing/Procedures: Your physician has requested that you have a renal artery duplex. During this test, an ultrasound is used to evaluate blood flow to the kidneys. Allow one hour for this exam. Do not eat after midnight the day before and avoid carbonated beverages. Take your medications as you usually do. [place]   Follow-Up: AS NEEDED

## 2021-02-15 NOTE — Assessment & Plan Note (Signed)
Encouraged to continue CPAP use.

## 2021-02-15 NOTE — Assessment & Plan Note (Signed)
Patient reported palpitations and was noted to have PACs on EKG today.  We will check a basic metabolic panel and magnesium.

## 2021-02-15 NOTE — Assessment & Plan Note (Addendum)
BP better controlled since adding clonidine.  She does have resistant hypertension as she is on multiple blood pressure medications.  We will check renal artery Dopplers.  Thyroid function has been normal.  We will not refer her to the PREP exercise program given that she needs surgery on her foot.  Continue amlodipine, benazepril, BiDil, carvedilol, clonidine, and furosemide.  Continue using CPAP.

## 2021-02-15 NOTE — Assessment & Plan Note (Signed)
Continue rosuvastatin.  

## 2021-02-16 ENCOUNTER — Telehealth: Payer: Self-pay | Admitting: Podiatry

## 2021-02-16 NOTE — Telephone Encounter (Signed)
Please put in epic orders for this patients case from Friday 7/8

## 2021-02-17 ENCOUNTER — Ambulatory Visit: Payer: Medicaid Other | Admitting: Podiatry

## 2021-02-18 ENCOUNTER — Other Ambulatory Visit: Payer: Self-pay

## 2021-02-18 ENCOUNTER — Ambulatory Visit (HOSPITAL_COMMUNITY)
Admission: RE | Admit: 2021-02-18 | Discharge: 2021-02-18 | Disposition: A | Discharge status: Home / Self Care | Payer: Medicaid Other | Source: Ambulatory Visit | Attending: Cardiovascular Disease | Admitting: Cardiovascular Disease

## 2021-02-18 DIAGNOSIS — I10 Essential (primary) hypertension: Secondary | ICD-10-CM | POA: Diagnosis not present

## 2021-02-18 LAB — BASIC METABOLIC PANEL
BUN/Creatinine Ratio: 15 (ref 9–23)
BUN: 26 mg/dL — ABNORMAL HIGH (ref 6–24)
CO2: 22 mmol/L (ref 20–29)
Calcium: 8.7 mg/dL (ref 8.7–10.2)
Chloride: 102 mmol/L (ref 96–106)
Creatinine, Ser: 1.78 mg/dL — ABNORMAL HIGH (ref 0.57–1.00)
Glucose: 104 mg/dL — ABNORMAL HIGH (ref 65–99)
Potassium: 4.2 mmol/L (ref 3.5–5.2)
Sodium: 141 mmol/L (ref 134–144)
eGFR: 35 mL/min/{1.73_m2} — ABNORMAL LOW (ref >59)

## 2021-02-18 LAB — MAGNESIUM: Magnesium: 1.4 mg/dL — ABNORMAL LOW (ref 1.6–2.3)

## 2021-02-18 NOTE — Progress Notes (Signed)
Chart reviewed with Dr. Daiva Huge, patient will need to have at least a cbc drawn prior to surgery. Also, patient has pending orders from cardiology for labs that need to be done. Called placed to patient and left message for her to come into office today for labs.

## 2021-02-19 ENCOUNTER — Other Ambulatory Visit: Payer: Self-pay | Admitting: Podiatry

## 2021-02-19 ENCOUNTER — Ambulatory Visit (HOSPITAL_BASED_OUTPATIENT_CLINIC_OR_DEPARTMENT_OTHER): Payer: Medicaid Other | Admitting: Anesthesiology

## 2021-02-19 ENCOUNTER — Telehealth: Payer: Self-pay | Admitting: Podiatry

## 2021-02-19 ENCOUNTER — Encounter (HOSPITAL_BASED_OUTPATIENT_CLINIC_OR_DEPARTMENT_OTHER)
Admission: RE | Disposition: A | Discharge status: Home / Self Care | Payer: Self-pay | Source: Home / Self Care | Attending: Podiatry

## 2021-02-19 ENCOUNTER — Ambulatory Visit (HOSPITAL_BASED_OUTPATIENT_CLINIC_OR_DEPARTMENT_OTHER)
Admission: RE | Admit: 2021-02-19 | Discharge: 2021-02-19 | Disposition: A | Discharge status: Home / Self Care | Payer: Medicaid Other | Attending: Podiatry | Admitting: Podiatry

## 2021-02-19 ENCOUNTER — Encounter: Payer: Self-pay | Admitting: Podiatry

## 2021-02-19 ENCOUNTER — Encounter (HOSPITAL_BASED_OUTPATIENT_CLINIC_OR_DEPARTMENT_OTHER): Payer: Self-pay | Admitting: Podiatry

## 2021-02-19 ENCOUNTER — Other Ambulatory Visit: Payer: Self-pay

## 2021-02-19 DIAGNOSIS — Z79899 Other long term (current) drug therapy: Secondary | ICD-10-CM | POA: Diagnosis not present

## 2021-02-19 DIAGNOSIS — Z87891 Personal history of nicotine dependence: Secondary | ICD-10-CM | POA: Insufficient documentation

## 2021-02-19 DIAGNOSIS — Z7984 Long term (current) use of oral hypoglycemic drugs: Secondary | ICD-10-CM | POA: Insufficient documentation

## 2021-02-19 DIAGNOSIS — Z7982 Long term (current) use of aspirin: Secondary | ICD-10-CM | POA: Diagnosis not present

## 2021-02-19 DIAGNOSIS — L97519 Non-pressure chronic ulcer of other part of right foot with unspecified severity: Secondary | ICD-10-CM | POA: Diagnosis not present

## 2021-02-19 DIAGNOSIS — E11621 Type 2 diabetes mellitus with foot ulcer: Secondary | ICD-10-CM | POA: Insufficient documentation

## 2021-02-19 DIAGNOSIS — E1142 Type 2 diabetes mellitus with diabetic polyneuropathy: Secondary | ICD-10-CM | POA: Insufficient documentation

## 2021-02-19 DIAGNOSIS — M216X2 Other acquired deformities of left foot: Secondary | ICD-10-CM | POA: Diagnosis not present

## 2021-02-19 DIAGNOSIS — X58XXXA Exposure to other specified factors, initial encounter: Secondary | ICD-10-CM | POA: Insufficient documentation

## 2021-02-19 DIAGNOSIS — S86012A Strain of left Achilles tendon, initial encounter: Secondary | ICD-10-CM | POA: Diagnosis not present

## 2021-02-19 DIAGNOSIS — Z794 Long term (current) use of insulin: Secondary | ICD-10-CM | POA: Insufficient documentation

## 2021-02-19 HISTORY — PX: GASTROC RECESSION EXTREMITY: SHX6262

## 2021-02-19 HISTORY — PX: ACHILLES TENDON SURGERY: SHX542

## 2021-02-19 LAB — GLUCOSE, CAPILLARY
Glucose-Capillary: 105 mg/dL — ABNORMAL HIGH (ref 70–99)
Glucose-Capillary: 131 mg/dL — ABNORMAL HIGH (ref 70–99)

## 2021-02-19 LAB — POCT HEMOGLOBIN-HEMACUE: Hemoglobin: 12.4 g/dL (ref 12.0–15.0)

## 2021-02-19 LAB — POCT PREGNANCY, URINE: Preg Test, Ur: NEGATIVE

## 2021-02-19 SURGERY — REPAIR, TENDON, ACHILLES
Anesthesia: General | Site: Leg Lower | Laterality: Left

## 2021-02-19 MED ORDER — PROPOFOL 10 MG/ML IV BOLUS
INTRAVENOUS | Status: DC | PRN
  Administered 2021-02-19: 150 mg via INTRAVENOUS

## 2021-02-19 MED ORDER — OXYCODONE HCL 5 MG/5ML PO SOLN: 5.0000 mg | Freq: Once | ORAL | Status: DC | PRN

## 2021-02-19 MED ORDER — BUPIVACAINE-EPINEPHRINE (PF) 0.5% -1:200000 IJ SOLN
INTRAMUSCULAR | Status: DC | PRN
  Administered 2021-02-19: 30 mL via PERINEURAL

## 2021-02-19 MED ORDER — LIDOCAINE HCL (PF) 2 % IJ SOLN
INTRAMUSCULAR | Status: AC
  Filled 2021-02-19: qty 5

## 2021-02-19 MED ORDER — SUGAMMADEX SODIUM 200 MG/2ML IV SOLN
INTRAVENOUS | Status: DC | PRN
  Administered 2021-02-19: 200 mg via INTRAVENOUS

## 2021-02-19 MED ORDER — ONDANSETRON HCL 4 MG/2ML IJ SOLN
INTRAMUSCULAR | Status: DC | PRN
  Administered 2021-02-19: 4 mg via INTRAVENOUS

## 2021-02-19 MED ORDER — AMISULPRIDE (ANTIEMETIC) 5 MG/2ML IV SOLN: 10.0000 mg | Freq: Once | INTRAVENOUS | Status: DC | PRN

## 2021-02-19 MED ORDER — FENTANYL CITRATE (PF) 100 MCG/2ML IJ SOLN
100.0000 ug | Freq: Once | INTRAMUSCULAR | Status: AC
Start: 2021-02-19 — End: 2021-02-19
  Administered 2021-02-19: 100 ug via INTRAVENOUS

## 2021-02-19 MED ORDER — LACTATED RINGERS IV SOLN: INTRAVENOUS | Status: DC

## 2021-02-19 MED ORDER — SCOPOLAMINE 1 MG/3DAYS TD PT72
MEDICATED_PATCH | TRANSDERMAL | Status: AC
  Filled 2021-02-19: qty 1

## 2021-02-19 MED ORDER — LABETALOL HCL 5 MG/ML IV SOLN
INTRAVENOUS | Status: AC
  Filled 2021-02-19: qty 4

## 2021-02-19 MED ORDER — OXYCODONE HCL 5 MG PO TABS: 5.0000 mg | ORAL_TABLET | Freq: Once | ORAL | Status: DC | PRN

## 2021-02-19 MED ORDER — MIDAZOLAM HCL 5 MG/5ML IJ SOLN
INTRAMUSCULAR | Status: DC | PRN
  Administered 2021-02-19: 1 mg via INTRAVENOUS

## 2021-02-19 MED ORDER — ROCURONIUM BROMIDE 10 MG/ML (PF) SYRINGE
PREFILLED_SYRINGE | INTRAVENOUS | Status: AC
  Filled 2021-02-19: qty 10

## 2021-02-19 MED ORDER — SUGAMMADEX SODIUM 500 MG/5ML IV SOLN
INTRAVENOUS | Status: AC
  Filled 2021-02-19: qty 5

## 2021-02-19 MED ORDER — CEFAZOLIN SODIUM-DEXTROSE 2-4 GM/100ML-% IV SOLN
INTRAVENOUS | Status: AC
  Filled 2021-02-19: qty 100

## 2021-02-19 MED ORDER — LABETALOL HCL 5 MG/ML IV SOLN
5.0000 mg | INTRAVENOUS | Status: DC | PRN
  Administered 2021-02-19: 5 mg via INTRAVENOUS

## 2021-02-19 MED ORDER — ACETAMINOPHEN 325 MG PO TABS: 325.0000 mg | ORAL_TABLET | ORAL | Status: DC | PRN

## 2021-02-19 MED ORDER — ACETAMINOPHEN 10 MG/ML IV SOLN: 1000.0000 mg | Freq: Once | INTRAVENOUS | Status: DC | PRN

## 2021-02-19 MED ORDER — DEXAMETHASONE SODIUM PHOSPHATE 10 MG/ML IJ SOLN
INTRAMUSCULAR | Status: AC
  Filled 2021-02-19: qty 1

## 2021-02-19 MED ORDER — SCOPOLAMINE 1 MG/3DAYS TD PT72
1.0000 | MEDICATED_PATCH | TRANSDERMAL | Status: DC
  Administered 2021-02-19: 1.5 mg via TRANSDERMAL

## 2021-02-19 MED ORDER — MIDAZOLAM HCL 2 MG/2ML IJ SOLN
2.0000 mg | Freq: Once | INTRAMUSCULAR | Status: AC
  Administered 2021-02-19: 2 mg via INTRAVENOUS

## 2021-02-19 MED ORDER — DEXAMETHASONE SODIUM PHOSPHATE 4 MG/ML IJ SOLN
INTRAMUSCULAR | Status: DC | PRN
  Administered 2021-02-19: 5 mg via INTRAVENOUS

## 2021-02-19 MED ORDER — MIDAZOLAM HCL 2 MG/2ML IJ SOLN
INTRAMUSCULAR | Status: AC
  Filled 2021-02-19: qty 2

## 2021-02-19 MED ORDER — FENTANYL CITRATE (PF) 100 MCG/2ML IJ SOLN: 25.0000 ug | INTRAMUSCULAR | Status: DC | PRN

## 2021-02-19 MED ORDER — ACETAMINOPHEN 160 MG/5ML PO SOLN: 325.0000 mg | ORAL | Status: DC | PRN

## 2021-02-19 MED ORDER — PHENYLEPHRINE 40 MCG/ML (10ML) SYRINGE FOR IV PUSH (FOR BLOOD PRESSURE SUPPORT)
PREFILLED_SYRINGE | INTRAVENOUS | Status: AC
  Filled 2021-02-19: qty 10

## 2021-02-19 MED ORDER — ROPIVACAINE HCL 7.5 MG/ML IJ SOLN
INTRAMUSCULAR | Status: DC | PRN
  Administered 2021-02-19: 20 mL via PERINEURAL

## 2021-02-19 MED ORDER — ROCURONIUM BROMIDE 100 MG/10ML IV SOLN
INTRAVENOUS | Status: DC | PRN
  Administered 2021-02-19: 50 mg via INTRAVENOUS

## 2021-02-19 MED ORDER — OXYCODONE-ACETAMINOPHEN 5-325 MG PO TABS: 1.0000 | ORAL_TABLET | ORAL | 0 refills | Status: DC | PRN

## 2021-02-19 MED ORDER — PROMETHAZINE HCL 25 MG/ML IJ SOLN: 6.2500 mg | INTRAMUSCULAR | Status: DC | PRN

## 2021-02-19 MED ORDER — EPHEDRINE 5 MG/ML INJ
INTRAVENOUS | Status: AC
  Filled 2021-02-19: qty 10

## 2021-02-19 MED ORDER — FENTANYL CITRATE (PF) 100 MCG/2ML IJ SOLN
INTRAMUSCULAR | Status: AC
  Filled 2021-02-19: qty 2

## 2021-02-19 MED ORDER — IBUPROFEN 800 MG PO TABS: 800.0000 mg | ORAL_TABLET | Freq: Four times a day (QID) | ORAL | 1 refills | Status: DC | PRN

## 2021-02-19 MED ORDER — SUCCINYLCHOLINE CHLORIDE 200 MG/10ML IV SOSY
PREFILLED_SYRINGE | INTRAVENOUS | Status: AC
  Filled 2021-02-19: qty 10

## 2021-02-19 MED ORDER — ONDANSETRON HCL 4 MG/2ML IJ SOLN
INTRAMUSCULAR | Status: AC
  Filled 2021-02-19: qty 2

## 2021-02-19 SURGICAL SUPPLY — 76 items
APL PRP STRL LF DISP 70% ISPRP (MISCELLANEOUS) ×1
BANDAGE ESMARK 6X9 LF (GAUZE/BANDAGES/DRESSINGS) ×1 IMPLANT
BLADE AVERAGE 25X9 (BLADE) IMPLANT
BLADE HEX COATED 2.75 (ELECTRODE) ×1 IMPLANT
BLADE MICRO SAGITTAL (BLADE) ×1 IMPLANT
BLADE SAW SGTL 14.8X61X.97 HD (BLADE) ×1 IMPLANT
BLADE SURG 15 STRL LF DISP TIS (BLADE) ×3 IMPLANT
BLADE SURG 15 STRL SS (BLADE) ×6
BNDG CMPR 9X6 STRL LF SNTH (GAUZE/BANDAGES/DRESSINGS) ×1
BNDG ELASTIC 3X5.8 VLCR STR LF (GAUZE/BANDAGES/DRESSINGS) ×1 IMPLANT
BNDG ELASTIC 4X5.8 VLCR STR LF (GAUZE/BANDAGES/DRESSINGS) ×1 IMPLANT
BNDG ELASTIC 6X5.8 VLCR STR LF (GAUZE/BANDAGES/DRESSINGS) ×1 IMPLANT
BNDG ESMARK 6X9 LF (GAUZE/BANDAGES/DRESSINGS) ×2
BNDG GAUZE ELAST 4 BULKY (GAUZE/BANDAGES/DRESSINGS) ×2 IMPLANT
CHLORAPREP W/TINT 26 (MISCELLANEOUS) ×2 IMPLANT
COVER BACK TABLE 60X90IN (DRAPES) ×2 IMPLANT
COVER MAYO STAND STRL (DRAPES) IMPLANT
CUFF TOURN SGL QUICK 24 (TOURNIQUET CUFF)
CUFF TOURN SGL QUICK 34 (TOURNIQUET CUFF) ×2
CUFF TRNQT CYL 24X4X16.5-23 (TOURNIQUET CUFF) IMPLANT
CUFF TRNQT CYL 34X4.125X (TOURNIQUET CUFF) IMPLANT
DRAPE C-ARM 42X72 X-RAY (DRAPES) IMPLANT
DRAPE C-ARMOR (DRAPES) ×1 IMPLANT
DRAPE EXTREMITY T 121X128X90 (DISPOSABLE) ×2 IMPLANT
DRAPE IMP U-DRAPE 54X76 (DRAPES) ×2 IMPLANT
DRAPE U-SHAPE 47X51 STRL (DRAPES) ×2 IMPLANT
DRSG ADAPTIC 3X8 NADH LF (GAUZE/BANDAGES/DRESSINGS) IMPLANT
DRSG EMULSION OIL 3X3 NADH (GAUZE/BANDAGES/DRESSINGS) IMPLANT
DRSG PAD ABDOMINAL 8X10 ST (GAUZE/BANDAGES/DRESSINGS) IMPLANT
ELECT REM PT RETURN 9FT ADLT (ELECTROSURGICAL) ×2
ELECTRODE REM PT RTRN 9FT ADLT (ELECTROSURGICAL) ×1 IMPLANT
GAUZE 4X4 16PLY ~~LOC~~+RFID DBL (SPONGE) IMPLANT
GAUZE SPONGE 4X4 12PLY STRL (GAUZE/BANDAGES/DRESSINGS) ×2 IMPLANT
GAUZE XEROFORM 1X8 LF (GAUZE/BANDAGES/DRESSINGS) ×2 IMPLANT
GLOVE SURG ENC MOIS LTX SZ7 (GLOVE) ×2 IMPLANT
GLOVE SURG UNDER POLY LF SZ7.5 (GLOVE) ×2 IMPLANT
GOWN STRL REUS W/ TWL LRG LVL3 (GOWN DISPOSABLE) ×1 IMPLANT
GOWN STRL REUS W/TWL LRG LVL3 (GOWN DISPOSABLE) ×2
GOWN STRL REUS W/TWL XL LVL3 (GOWN DISPOSABLE) ×2 IMPLANT
IMP TENDON SHEET TENOGLIDE 4X5 (Tissue) ×1 IMPLANT
IMPL TENDON SHEET TENOGLID 4X5 (Tissue) IMPLANT
NDL HYPO 25X1 1.5 SAFETY (NEEDLE) ×1 IMPLANT
NEEDLE HYPO 25X1 1.5 SAFETY (NEEDLE) IMPLANT
NS IRRIG 1000ML POUR BTL (IV SOLUTION) ×1 IMPLANT
PACK BASIN DAY SURGERY FS (CUSTOM PROCEDURE TRAY) ×2 IMPLANT
PAD CAST 4YDX4 CTTN HI CHSV (CAST SUPPLIES) ×1 IMPLANT
PADDING CAST ABS 4INX4YD NS (CAST SUPPLIES) ×1
PADDING CAST ABS COTTON 4X4 ST (CAST SUPPLIES) ×1 IMPLANT
PADDING CAST COTTON 4X4 STRL (CAST SUPPLIES)
PENCIL SMOKE EVACUATOR (MISCELLANEOUS) ×2 IMPLANT
SHEET MEDIUM DRAPE 40X70 STRL (DRAPES) ×1 IMPLANT
SLEEVE SCD COMPRESS KNEE MED (STOCKING) ×2 IMPLANT
SPLINT FIBERGLASS 4X30 (CAST SUPPLIES) ×1 IMPLANT
SPONGE T-LAP 4X18 ~~LOC~~+RFID (SPONGE) ×2 IMPLANT
STAPLER VISISTAT 35W (STAPLE) ×1 IMPLANT
STOCKINETTE 6  STRL (DRAPES) ×2
STOCKINETTE 6 STRL (DRAPES) ×1 IMPLANT
STOCKINETTE SYNTHETIC 4 NONSTR (MISCELLANEOUS) ×1 IMPLANT
SUCTION FRAZIER HANDLE 10FR (MISCELLANEOUS) ×2
SUCTION TUBE FRAZIER 10FR DISP (MISCELLANEOUS) ×1 IMPLANT
SUT ETHIBOND 2-0 V-5 NDL (SUTURE) IMPLANT
SUT ETHIBOND 2-0 V-5 NEEDLE (SUTURE) ×2 IMPLANT
SUT ETHILON 4 0 PS 2 18 (SUTURE) IMPLANT
SUT MERSILENE 4 0 P 3 (SUTURE) IMPLANT
SUT MNCRL AB 3-0 PS2 18 (SUTURE) ×3 IMPLANT
SUT MNCRL AB 4-0 PS2 18 (SUTURE) ×2 IMPLANT
SUT PROLENE 4 0 PS 2 18 (SUTURE) ×1 IMPLANT
SUT VIC AB 2-0 SH 27 (SUTURE)
SUT VIC AB 2-0 SH 27XBRD (SUTURE) ×1 IMPLANT
SUT VICRYL 4-0 PS2 18IN ABS (SUTURE) ×1 IMPLANT
SYR BULB EAR ULCER 3OZ GRN STR (SYRINGE) ×2 IMPLANT
SYR CONTROL 10ML LL (SYRINGE) ×1 IMPLANT
TENDON PROTECTOR SHEET (Tissue) ×2 IMPLANT
TOWEL GREEN STERILE FF (TOWEL DISPOSABLE) ×2 IMPLANT
UNDERPAD 30X36 HEAVY ABSORB (UNDERPADS AND DIAPERS) ×2 IMPLANT
YANKAUER SUCT BULB TIP NO VENT (SUCTIONS) ×2 IMPLANT

## 2021-02-19 NOTE — Interval H&P Note (Signed)
Anesthesia H&P Update: History and Physical Exam reviewed; patient is OK for planned anesthetic and procedure. ? ?

## 2021-02-19 NOTE — Transfer of Care (Signed)
Immediate Anesthesia Transfer of Care Note  Patient: [name]  Procedure(s) Performed: ACHILLES TENDON REPAIR WITH GRAFT (Left: Leg Lower) GASTROC RECESSION EXTREMITY (Left: Leg Lower)  Patient Location: PACU  Anesthesia Type:GA combined with regional for post-op pain  Level of Consciousness: awake, alert  and oriented  Airway & Oxygen Therapy: Patient Spontanous Breathing and Patient connected to face mask oxygen  Post-op Assessment: Report given to RN and Post -op Vital signs reviewed and stable  Post vital signs: Reviewed and stable  Last Vitals:  Vitals Value Taken Time  BP 179/85 02/19/21 1522  Temp 36.6 C 02/19/21 1522  Pulse 79 02/19/21 1523  Resp 26 02/19/21 1523  SpO2 98 % 02/19/21 1523  Vitals shown include unvalidated device data.  Last Pain: There were no vitals filed for this visit.       Complications: No notable events documented.

## 2021-02-19 NOTE — Anesthesia Preprocedure Evaluation (Addendum)
Anesthesia Evaluation  Patient identified by MRN, date of birth, ID band Patient awake    Reviewed: Allergy & Precautions, NPO status , [name] Chart, lab work & pertinent test results, reviewed documented beta blocker date and time   Airway Mallampati: II  TM Distance: >3 FB Neck ROM: Full    Dental  (+) Teeth Intact, Dental Advisory Given, Chipped,    Pulmonary asthma , sleep apnea , former smoker,    breath sounds clear to auscultation       Cardiovascular hypertension, Pt. on medications and Pt. on home beta blockers + CAD   Rhythm:Regular Rate:Normal  EKG: SR w/ PAC's  Echo:  1. Left ventricular ejection fraction, by estimation, is 60 to 65%. The  left ventricle has normal function. The left ventricle has no regional  wall motion abnormalities. There is severe left ventricular hypertrophy.  Left ventricular diastolic parameters  were normal.  2. Right ventricular systolic function is normal. The right ventricular  size is normal. There is normal pulmonary artery systolic pressure. The  estimated right ventricular systolic pressure is [id number] mmHg.  3. A small pericardial effusion is present.  4. The mitral valve is normal in structure. No evidence of mitral valve  regurgitation.  5. The aortic valve was not well visualized. Aortic valve regurgitation  is mild. No aortic stenosis is present.  6. The inferior vena cava is dilated in size with >50% respiratory  variability, suggesting right atrial pressure of 8 mmHg.    Neuro/Psych CVA    GI/Hepatic   Endo/Other  diabetes, Type 2, Insulin Dependent, Oral Hypoglycemic Agents  Renal/GU Renal Insufficiency and CRFRenal disease     Musculoskeletal   Abdominal Normal abdominal exam  (+)   Peds  Hematology   Anesthesia Other Findings   Reproductive/Obstetrics                            Anesthesia Physical Anesthesia Plan  ASA:  3  Anesthesia Plan: General   Post-op Pain Management: GA combined w/ Regional for post-op pain   Induction: Intravenous  PONV Risk Score and Plan: 4 or greater and Ondansetron, Dexamethasone, Midazolam and Scopolamine patch - Pre-op  Airway Management Planned: Oral ETT  Additional Equipment: None  Intra-op Plan:   Post-operative Plan: Extubation in OR  Informed Consent: I have reviewed the patients History and Physical, chart, labs and discussed the procedure including the risks, benefits and alternatives for the proposed anesthesia with the patient or authorized representative who has indicated his/her understanding and acceptance.     Dental advisory given  Plan Discussed with: CRNA  Anesthesia Plan Comments:        Anesthesia Quick Evaluation

## 2021-02-19 NOTE — Discharge Instructions (Addendum)
Post Anesthesia Home Care Instructions  Activity: Get plenty of rest for the remainder of the day. A responsible individual must stay with you for 24 hours following the procedure.  For the next 24 hours, DO NOT: -Drive a car -Paediatric nurse -Drink alcoholic beverages -Take any medication unless instructed by your physician -Make any legal decisions or sign important papers.  Meals: Start with liquid foods such as gelatin or soup. Progress to regular foods as tolerated. Avoid greasy, spicy, heavy foods. If nausea and/or vomiting occur, drink only clear liquids until the nausea and/or vomiting subsides. Call your physician if vomiting continues.  Special Instructions/Symptoms: Your throat may feel dry or sore from the anesthesia or the breathing tube placed in your throat during surgery. If this causes discomfort, gargle with warm salt water. The discomfort should disappear within 24 hours.  If you had a scopolamine patch placed behind your ear for the management of post- operative nausea and/or vomiting:  1. The medication in the patch is effective for 72 hours, after which it should be removed.  Wrap patch in a tissue and discard in the trash. Wash hands thoroughly with soap and water. 2. You may remove the patch earlier than 72 hours if you experience unpleasant side effects which may include dry mouth, dizziness or visual disturbances. 3. Avoid touching the patch. Wash your hands with soap and water after contact with the patch.     Regional Anesthesia Blocks  1. Numbness or the inability to move the "blocked" extremity may last from 3-48 hours after placement. The length of time depends on the medication injected and your individual response to the medication. If the numbness is not going away after 48 hours, call your surgeon.  2. The extremity that is blocked will need to be protected until the numbness is gone and the  Strength has returned. Because you cannot feel it, you will  need to take extra care to avoid injury. Because it may be weak, you may have difficulty moving it or using it. You may not know what position it is in without looking at it while the block is in effect.  3. For blocks in the legs and feet, returning to weight bearing and walking needs to be done carefully. You will need to wait until the numbness is entirely gone and the strength has returned. You should be able to move your leg and foot normally before you try and bear weight or walk. You will need someone to be with you when you first try to ensure you do not fall and possibly risk injury.  4. Bruising and tenderness at the needle site are common side effects and will resolve in a few days.  5. Persistent numbness or new problems with movement should be communicated to the surgeon or the [place] [phone] [place] [phone]).    After Surgery Instructions   1) If you are recuperating from surgery anywhere other than home, please be sure to leave [place] the number where you can be reached.  2) Go directly home and rest.  3) Keep the operated foot(feet) elevated six inches above the hip when sitting or lying down. This will help control swelling and pain.  4) Support the elevated foot and leg with pillows. DO NOT PLACE PILLOWS UNDER THE KNEE.  5) DO NOT REMOVE or get your bandages WET, unless you were given different instructions by your doctor to do so. This increases the risk of infection.  6) Wear your surgical shoe or surgical boot at all times when you are up on your feet.  7) A limited amount of pain and swelling may occur. The skin may take on a bruised appearance. DO NOT BE ALARMED, THIS IS NORMAL.  8) For slight pain and swelling, apply an ice pack directly over the bandages for 15 minutes only out of each hour of the day. Continue until seen in the office for your first post op visit. DON NOT     APPLY ANY FORM OF HEAT TO THE AREA.  9) Have  prescriptions filled immediately and take as directed.  10) Drink lots of liquids, water and juice to stay hydrated.  11) CALL IMMEDIATELY IF:  *Bleeding continues until the following day of surgery  *Pain increases and/or does not respond to medication  *Bandages or cast appears to tight  *If your bandage gets wet  *Trip, fall or stump your surgical foot  *If your temperature goes above 101  *If you have ANY questions at all  [place]. ADHERING TO THESE INSTRUCTIONS WILL OFFER YOU THE MOST COMPLETE RESULTS

## 2021-02-19 NOTE — Interval H&P Note (Signed)
History and Physical Interval Note:  02/19/2021 2:01 PM  [name]  has presented today for surgery, with the diagnosis of RUPTURE ACHILLES LEF.  The various methods of treatment have been discussed with the patient and family. After consideration of risks, benefits and other options for treatment, the patient has consented to  Procedure(s) with comments: ACHILLES TENDON REPAIR (Left) - BLOCK GASTROC RECESSION EXTREMITY (Left) as a surgical intervention.  The patient's history has been reviewed, patient examined, no change in status, stable for surgery.  I have reviewed the patient's chart and labs.  Questions were answered to the patient's satisfaction.     Felipa Furnace

## 2021-02-19 NOTE — Telephone Encounter (Signed)
Responded in secure chat

## 2021-02-19 NOTE — Anesthesia Procedure Notes (Signed)
Procedure Name: Intubation Date/Time: 02/19/2021 2:12 PM Performed by: Willa Frater, CRNA Pre-anesthesia Checklist: Patient identified, Emergency Drugs available, Suction available and Patient being monitored Patient Re-evaluated:Patient Re-evaluated prior to induction Oxygen Delivery Method: Circle system utilized Preoxygenation: Pre-oxygenation with 100% oxygen Induction Type: IV induction Ventilation: Mask ventilation without difficulty Laryngoscope Size: Mac and 3 Grade View: Grade II Tube type: Oral Number of attempts: 1 Airway Equipment and Method: Stylet and Oral airway Placement Confirmation: ETT inserted through vocal cords under direct vision, positive ETCO2 and breath sounds checked- equal and bilateral Secured at: 22 cm Tube secured with: Tape Dental Injury: Teeth and Oropharynx as per pre-operative assessment

## 2021-02-19 NOTE — Anesthesia Procedure Notes (Signed)
Anesthesia Regional Block: Adductor canal block   Pre-Anesthetic Checklist: , timeout performed,  Correct Patient, Correct Site, Correct Laterality,  Correct Procedure, Correct Position, site marked,  Risks and benefits discussed,  Surgical consent,  Pre-op evaluation,  At surgeon's request and post-op pain management  Laterality: Left  Prep: chloraprep       Needles:  Injection technique: Single-shot  Needle Type: Echogenic Stimulator Needle     Needle Length: 9cm  Needle Gauge: 21     Additional Needles:   Procedures:,,,, ultrasound used (permanent image in chart),,    Narrative:  Start time: 02/19/2021 1:12 PM End time: 02/19/2021 1:15 PM Injection made incrementally with aspirations every 5 mL.  Performed by: Personally  Anesthesiologist: Effie Berkshire, MD  Additional Notes: Patient tolerated the procedure well. Local anesthetic introduced in an incremental fashion under minimal resistance after negative aspirations. No paresthesias were elicited. After completion of the procedure, no acute issues were identified and patient continued to be monitored by RN.

## 2021-02-19 NOTE — Telephone Encounter (Signed)
Surgery  center called wanting to know if they should be sending [name] home with a boot or surgical shoe

## 2021-02-19 NOTE — Telephone Encounter (Signed)
The Surgery center called about [name] wanting to know if she was suppose to go home in a surgical shoe or boot . I place the nurse that called on a brief hold to get in contact with our on call provider ... eventually she hung up . I called the surgery center back and informed jade that [name] should go home in a boot .

## 2021-02-19 NOTE — Op Note (Signed)
Surgeon: Surgeon(s): Felipa Furnace, DPM  Assistants: None Pre-operative diagnosis: RUPTURE ACHILLES, LEFT  Post-operative diagnosis: same Procedure: Procedure(s) (LRB): ACHILLES TENDON REPAIR WITH GRAFT (Left) GASTROC RECESSION EXTREMITY (Left)  Pathology: * No specimens in log *  Pertinent Intra-op findings: Longitudinal tearing noted of the left Achilles.  Positive Silfverskiold test with gastrocnemius equinus Anesthesia: General  Hemostasis:  Total Tourniquet Time Documented: Thigh (Left) - 30 minutes Total: Thigh (Left) - 30 minutes  EBL: Minimal Materials: 2-0 Ethibond, 3-0 Monocryl, 4-0 Monocryl, staples Injectables: None Complications: None  Indications for surgery: A 50 y.o. female presents with left Achilles tendon tear with gastrocnemius equinus. Patient has failed all conservative therapy including but not limited to cam boot immobilization and nonweightbearing. She wishes to have surgical correction of the foot/deformity. It was determined that patient would benefit from left Achilles tendon primary repair with gastrocnemius recession. Informed surgical risk consent was reviewed and read aloud to the patient.  I reviewed the films.  I have discussed my findings with the patient in great detail.  I have discussed all risks including but not limited to infection, stiffness, scarring, limp, disability, deformity, damage to blood vessels and nerves, numbness, poor healing, need for braces, arthritis, chronic pain, amputation, death.  All benefits and realistic expectations discussed in great detail.  I have made no promises as to the outcome.  I have provided realistic expectations.  I have offered the patient a 2nd opinion, which they have declined and assured me they preferred to proceed despite the risks   Procedure in detail: The patient was both verbally and visually identified by myself, the nursing staff, and anesthesia staff in the preoperative holding area. They were then  transferred to the operating room and placed on the operative table in prone position.  Thigh tourniquet was set at 350 mmHg pressure and inflated.  Attention was then drawn to the medial aspect of the lower leg where a longitudinal incision was made at the myotendinous juncture of the gastrocnemius and soleus.  A clean #15 blade was used to make the incision down to the level of subcuticular tissue.  After which a combination of sharp and blunt dissection was used to dissect through the subcuticular tissue retracting all vital neurovascular structures as necessary.  Once down to the deep fascia a clean #15 blade was then used to make a longitudinal incision exposing the soleus muscle belly and the myotendinous junction of the gastrocnemius.  Blunt dissection was used to dissect through the fascial covering of the gastrocnemius myotendinous junction and blunt dissection was used to isolate the soleus muscle from the gastrocnemius junction.  At this time a scissor and hemostat was used to cut the myotendinous junction of the gastrocnemius only leaving the soleus as the main attachment for the myotendinous junction of the Achilles complex.  This was performed in a medial to lateral fashion staying within the deep fascia the entire time.  It is important to note that the intramuscular septum was also cut in order to release tension off of the myotendinous junction.  The silverskoild exam was performed on the operating room table after the incision to the myotendinous junction was performed and found to be adequate in nature correcting the equinus that was there preoperatively.  The incision site was then washed with copious muscle saline and closed in a deep to superficial manner first with 3-0 monoryl eapproximating the deep fascia after which 3-0 monocryl was then used to reapproximate the subcuticular tissue after which 3-0  Prolene was used to reapproximate the skin edges in a simple suture technique.     Attention was turned to the posterior leg.  We made an incision just central to the Achilles tendon.  Dissection was taken down through skin into the subcutaneous.  We took care to preserve the sural nerve and lesser saphenous vein throughout the procedure.    At this point, we encountered the peritenon, which was opened in line with the Achilles tendon.  At this point, we easily identified the Achilles rupture.  It was debrided of all interposed hematoma.  The loose fiber ends were removed.  We then placed sutures in both ends of the tendon with 2-0 Ethibond.  We then tied these, thus repairing the tendon.  At this point, our repair was oversewn with 3-0 Monocryl.  We then closed the peritenon.  We then closed the subcutaneous with 3-0 and 4-0 Monocryl sutures and the skin with skin staples.  Sterile dressings were applied followed by a well-padded splint with the foot in equinus.  Tourniquet was deflated.  Cap refill was noted to all digits.  At this point, the patient was returned to the supine position.  He was awakened and returned to the stretcher and authorized for transport to the recovery room, having tolerated the procedure well.    At the conclusion of the procedure the patient was awoken from anesthesia and found to have tolerated the procedure well any complications. There were transferred to PACU with vital signs stable and vascular status intact.  Boneta Lucks, DPM

## 2021-02-19 NOTE — Anesthesia Procedure Notes (Signed)
Anesthesia Regional Block: Popliteal block   Pre-Anesthetic Checklist: , timeout performed,  Correct Patient, Correct Site, Correct Laterality,  Correct Procedure, Correct Position, site marked,  Risks and benefits discussed,  Surgical consent,  Pre-op evaluation,  At surgeon's request and post-op pain management  Laterality: Left  Prep: chloraprep       Needles:  Injection technique: Single-shot  Needle Type: Echogenic Stimulator Needle     Needle Length: 9cm  Needle Gauge: 21     Additional Needles:   Procedures:,,,, ultrasound used (permanent image in chart),,    Narrative:  Start time: 02/19/2021 1:08 PM End time: 02/19/2021 1:11 PM Injection made incrementally with aspirations every 5 mL.  Performed by: Personally  Anesthesiologist: Effie Berkshire, MD  Additional Notes: Patient tolerated the procedure well. Local anesthetic introduced in an incremental fashion under minimal resistance after negative aspirations. No paresthesias were elicited. After completion of the procedure, no acute issues were identified and patient continued to be monitored by RN.

## 2021-02-19 NOTE — Progress Notes (Unsigned)
percoc

## 2021-02-19 NOTE — Progress Notes (Signed)
Assisted Dr. Smith Robert with left, ultrasound guided, popliteal and adductor canal blocks. Side rails up, monitors on throughout procedure. See vital signs in flow sheet. Tolerated Procedure well.

## 2021-02-20 NOTE — Anesthesia Postprocedure Evaluation (Signed)
Anesthesia Post Note  Patient: MIGUEL MARCOS  Procedure(s) Performed: ACHILLES TENDON REPAIR WITH GRAFT (Left: Leg Lower) GASTROC RECESSION EXTREMITY (Left: Leg Lower)     Patient location during evaluation: PACU Anesthesia Type: General and Regional Level of consciousness: awake and alert Pain management: pain level controlled Vital Signs Assessment: post-procedure vital signs reviewed and stable Respiratory status: spontaneous breathing, nonlabored ventilation, respiratory function stable and patient connected to nasal cannula oxygen Cardiovascular status: blood pressure returned to baseline and stable Postop Assessment: no apparent nausea or vomiting Anesthetic complications: no   No notable events documented.  Last Vitals:  Vitals:   02/19/21 1615 02/19/21 1619  BP: (!) 162/83 (!) 159/79  Pulse: 79 80  Resp: 16 18  Temp:  36.7 C  SpO2: 93% 93%    Last Pain:  Vitals:   02/19/21 1619  PainSc: 0-No pain                 Effie Berkshire

## 2021-02-22 ENCOUNTER — Other Ambulatory Visit: Payer: Self-pay | Admitting: Podiatry

## 2021-02-22 ENCOUNTER — Telehealth: Payer: Self-pay | Admitting: Podiatry

## 2021-02-22 MED ORDER — KETOROLAC TROMETHAMINE 10 MG PO TABS: 10.0000 mg | ORAL_TABLET | Freq: Four times a day (QID) | ORAL | 0 refills | Status: DC | PRN

## 2021-02-22 MED ORDER — HYDROCODONE-ACETAMINOPHEN 5-325 MG PO TABS: 1.0000 | ORAL_TABLET | Freq: Four times a day (QID) | ORAL | 0 refills | Status: DC | PRN

## 2021-02-22 NOTE — Telephone Encounter (Signed)
Patient is requesting a call back regarding this issue

## 2021-02-22 NOTE — Telephone Encounter (Signed)
Patient said the pain medication is not working, its only putting me to sleep. Patient said her pain level is at a 9, the medication puts her to sleep and she wakes up in pain.

## 2021-02-22 NOTE — Progress Notes (Unsigned)
to 

## 2021-02-23 ENCOUNTER — Encounter (HOSPITAL_BASED_OUTPATIENT_CLINIC_OR_DEPARTMENT_OTHER): Payer: Self-pay | Admitting: Podiatry

## 2021-02-23 ENCOUNTER — Other Ambulatory Visit (HOSPITAL_BASED_OUTPATIENT_CLINIC_OR_DEPARTMENT_OTHER): Payer: Self-pay | Admitting: *Deleted

## 2021-02-23 DIAGNOSIS — N289 Disorder of kidney and ureter, unspecified: Secondary | ICD-10-CM

## 2021-02-23 DIAGNOSIS — I10 Essential (primary) hypertension: Secondary | ICD-10-CM

## 2021-02-26 ENCOUNTER — Ambulatory Visit: Payer: Medicaid Other | Admitting: Podiatry

## 2021-02-26 ENCOUNTER — Other Ambulatory Visit: Payer: Self-pay

## 2021-02-26 ENCOUNTER — Encounter: Payer: Self-pay | Admitting: Podiatry

## 2021-02-26 DIAGNOSIS — S86012A Strain of left Achilles tendon, initial encounter: Secondary | ICD-10-CM

## 2021-02-26 DIAGNOSIS — Z794 Long term (current) use of insulin: Secondary | ICD-10-CM

## 2021-02-26 DIAGNOSIS — M21862 Other specified acquired deformities of left lower leg: Secondary | ICD-10-CM

## 2021-02-26 DIAGNOSIS — M216X2 Other acquired deformities of left foot: Secondary | ICD-10-CM

## 2021-02-26 DIAGNOSIS — E1142 Type 2 diabetes mellitus with diabetic polyneuropathy: Secondary | ICD-10-CM

## 2021-02-26 DIAGNOSIS — Z9889 Other specified postprocedural states: Secondary | ICD-10-CM

## 2021-02-26 NOTE — Progress Notes (Signed)
Subjective:  Patient ID: [name], female    DOB: 1971-01-13,  MRN: [id number]  Chief Complaint  Patient presents with   Routine Post Op    POST OP DOS 7.15.22    DOS: 02/19/2021 Procedure: Left Achilles tendon repair with gastrocnemius recession  50 y.o. female returns for post-op check.  Patient states she is doing well.  No pain.  She is doing well on Toradol.  She is try to wean off the medication.  She has been nonweightbearing to the left lower extremity knee scooter.  Bandages clean dry and intact  Review of Systems: Negative except as noted in the HPI. Denies N/V/F/Ch.  Past Medical History:  Diagnosis Date   Arthritis    Asthma    Cataract    Mixed form OU   CKD (chronic kidney disease)    Coronary artery disease    Diabetes mellitus    Diabetic retinopathy ([place])    NPDR OU   Hyperlipidemia    Hypertension    Hypertensive retinopathy    OU   Left thyroid nodule    diagnosed 07/2018   PAC (premature atrial contraction) 02/15/2021   Pseudotumor cerebri    Stroke [facility])    Vitamin D deficiency     Current Outpatient Medications:    Accu-Chek FastClix Lancets MISC, 4 (four) times daily., Disp: , Rfl:    ACCU-CHEK GUIDE test strip, 1 each by Other route See admin instructions. for testing, Disp: 100 each, Rfl: 2   albuterol (VENTOLIN HFA) 108 (90 Base) MCG/ACT inhaler, Inhale into the lungs every 6 (six) hours as needed for wheezing or shortness of breath., Disp: , Rfl:    amLODipine-benazepril (LOTREL) 10-40 MG capsule, Take 1 capsule by mouth daily., Disp: 90 capsule, Rfl: 2   aspirin 81 MG EC tablet, Take 1 tablet (81 mg total) by mouth daily., Disp: 90 tablet, Rfl: 2   blood glucose meter kit and supplies KIT, Dispense based on patient and insurance preference. Use up to four times daily as directed. (FOR ICD-9 250.00, 250.01)., Disp: 1 each, Rfl: 0   carvedilol (COREG) 25 MG tablet, Take 1 tablet (25 mg total) by mouth 2 (two) times daily with a meal.,  Disp: 180 tablet, Rfl: 2   Cholecalciferol (VITAMIN D3) 25 MCG (1000 UT) CAPS, Take 1 capsule by mouth daily., Disp: , Rfl:    cloNIDine (CATAPRES) 0.2 MG tablet, Take 0.2 mg by mouth 2 (two) times daily., Disp: , Rfl:    folic acid (FOLVITE) 1 MG tablet, Take 1 mg by mouth daily., Disp: , Rfl:    furosemide (LASIX) 20 MG tablet, Take 2 tablets (40 mg total) by mouth 2 (two) times daily. Take in the morning and afternoon, Disp: 90 tablet, Rfl: 3   glipiZIDE (GLUCOTROL) 10 MG tablet, Take 10 mg by mouth daily., Disp: , Rfl:    HYDROcodone-acetaminophen (NORCO) 5-325 MG tablet, Take 1 tablet by mouth every 6 (six) hours as needed for moderate pain., Disp: 30 tablet, Rfl: 0   hydrOXYzine (VISTARIL) 25 MG capsule, Take 25 mg by mouth at bedtime., Disp: , Rfl:    ibuprofen (ADVIL) 800 MG tablet, Take 1 tablet (800 mg total) by mouth every 6 (six) hours as needed., Disp: 60 tablet, Rfl: 1   insulin aspart protamine- aspart (NOVOLOG MIX 70/30) (70-30) 100 UNIT/ML injection, Inject 0.15 mLs (15 Units total) into the skin 2 (two) times daily with a meal. (Patient taking differently: Inject 15-17 Units into the skin See  admin instructions. Inject 15 units subcutaneous in the morning, then inject 17 units subcutaneous in the evening), Disp: 10 mL, Rfl: 11   Insulin Syringes, Disposable, U-100 1 ML MISC, 1 application by Does not apply route 2 (two) times a day., Disp: 100 each, Rfl: 0   ipratropium-albuterol (DUONEB) 0.5-2.5 (3) MG/3ML SOLN, Take 3 mLs by nebulization every 4 (four) hours as needed., Disp: 360 mL, Rfl: 0   isosorbide-hydrALAZINE (BIDIL) 20-37.5 MG tablet, Take 2 tablets by mouth 3 (three) times daily., Disp: 540 tablet, Rfl: 3   ketorolac (TORADOL) 10 MG tablet, Take 1 tablet (10 mg total) by mouth every 6 (six) hours as needed., Disp: 20 tablet, Rfl: 0   metFORMIN (GLUCOPHAGE) 1000 MG tablet, Take 1,000 mg by mouth 2 (two) times daily., Disp: , Rfl:    oxyCODONE-acetaminophen (PERCOCET) 5-325  MG tablet, Take 1-2 tablets by mouth every 4 (four) hours as needed for severe pain., Disp: 30 tablet, Rfl: 0   rosuvastatin (CRESTOR) 40 MG tablet, Take 40 mg by mouth daily., Disp: , Rfl:    vitamin B-12 (CYANOCOBALAMIN) 1000 MCG tablet, Take 1,000 mcg by mouth daily., Disp: , Rfl:   Social History   Tobacco Use  Smoking Status Former   Packs/day: 0.50   Years: 30.00   Pack years: 15.00   Types: Cigarettes   Quit date: 2017   Years since quitting: 5.5  Smokeless Tobacco Never    Not on File Objective:  There were no vitals filed for this visit. There is no height or weight on file to calculate BMI. Constitutional Well developed. Well nourished.  Vascular Foot warm and well perfused. Capillary refill normal to all digits.   Neurologic Normal speech. Oriented to person, place, and time. Epicritic sensation to light touch grossly present bilaterally.  Dermatologic Skin healing well without signs of infection. Skin edges well coapted without signs of infection.  Orthopedic: Tenderness to palpation noted about the surgical site.   Radiographs: None Assessment:   1. Type 2 diabetes mellitus with diabetic polyneuropathy, with long-term current use of insulin ([place])   2. Achilles tendon tear, left, initial encounter   3. Gastrocnemius equinus, left   4. Status post foot surgery    Plan:  Patient was evaluated and treated and all questions answered.  S/p foot surgery left -Progressing as expected post-operatively. -XR: See above -WB Status: Nonweightbearing in knee scooter left lower extremity -Sutures/staples: Intact.  No dehiscence noted.  No complication noted.  No clinical signs of infection noted. -Medications: None -Foot redressed.  No follow-ups on file.

## 2021-03-02 ENCOUNTER — Other Ambulatory Visit (HOSPITAL_BASED_OUTPATIENT_CLINIC_OR_DEPARTMENT_OTHER): Payer: Self-pay | Admitting: *Deleted

## 2021-03-02 DIAGNOSIS — N289 Disorder of kidney and ureter, unspecified: Secondary | ICD-10-CM

## 2021-03-02 DIAGNOSIS — I10 Essential (primary) hypertension: Secondary | ICD-10-CM

## 2021-03-12 ENCOUNTER — Ambulatory Visit (INDEPENDENT_AMBULATORY_CARE_PROVIDER_SITE_OTHER): Payer: Medicaid Other | Admitting: Podiatry

## 2021-03-12 ENCOUNTER — Other Ambulatory Visit: Payer: Self-pay

## 2021-03-12 DIAGNOSIS — E1142 Type 2 diabetes mellitus with diabetic polyneuropathy: Secondary | ICD-10-CM

## 2021-03-12 DIAGNOSIS — Z794 Long term (current) use of insulin: Secondary | ICD-10-CM

## 2021-03-12 DIAGNOSIS — M216X2 Other acquired deformities of left foot: Secondary | ICD-10-CM

## 2021-03-12 DIAGNOSIS — M21862 Other specified acquired deformities of left lower leg: Secondary | ICD-10-CM

## 2021-03-12 DIAGNOSIS — S86012A Strain of left Achilles tendon, initial encounter: Secondary | ICD-10-CM

## 2021-03-12 LAB — BASIC METABOLIC PANEL
BUN/Creatinine Ratio: 12 (ref 9–23)
BUN: 20 mg/dL (ref 6–24)
CO2: 23 mmol/L (ref 20–29)
Calcium: 9.2 mg/dL (ref 8.7–10.2)
Chloride: 98 mmol/L (ref 96–106)
Creatinine, Ser: 1.69 mg/dL — ABNORMAL HIGH (ref 0.57–1.00)
Glucose: 183 mg/dL — ABNORMAL HIGH (ref 65–99)
Potassium: 4.5 mmol/L (ref 3.5–5.2)
Sodium: 140 mmol/L (ref 134–144)
eGFR: 37 mL/min/{1.73_m2} — ABNORMAL LOW (ref >59)

## 2021-03-16 NOTE — Progress Notes (Signed)
Subjective:  Patient ID: [name], female    DOB: 06/09/1971,  MRN: [id number]  Chief Complaint  Patient presents with   Routine Post Op    POV DOS 7.15.22    DOS: 02/19/2021 Procedure: Left Achilles tendon repair with gastrocnemius recession  50 y.o. female returns for post-op check.  Patient states she is doing well.  No pain.  She is doing well on Toradol.  She is try to wean off the medication.  She has been nonweightbearing to the left lower extremity knee scooter.  Bandages clean dry and intact  Review of Systems: Negative except as noted in the HPI. Denies N/V/F/Ch.  Past Medical History:  Diagnosis Date   Arthritis    Asthma    Cataract    Mixed form OU   CKD (chronic kidney disease)    Coronary artery disease    Diabetes mellitus    Diabetic retinopathy ([place])    NPDR OU   Hyperlipidemia    Hypertension    Hypertensive retinopathy    OU   Left thyroid nodule    diagnosed 07/2018   PAC (premature atrial contraction) 02/15/2021   Pseudotumor cerebri    Stroke [facility])    Vitamin D deficiency     Current Outpatient Medications:    Accu-Chek FastClix Lancets MISC, 4 (four) times daily., Disp: , Rfl:    ACCU-CHEK GUIDE test strip, 1 each by Other route See admin instructions. for testing, Disp: 100 each, Rfl: 2   albuterol (VENTOLIN HFA) 108 (90 Base) MCG/ACT inhaler, Inhale into the lungs every 6 (six) hours as needed for wheezing or shortness of breath., Disp: , Rfl:    amLODipine-benazepril (LOTREL) 10-40 MG capsule, Take 1 capsule by mouth daily., Disp: 90 capsule, Rfl: 2   aspirin 81 MG EC tablet, Take 1 tablet (81 mg total) by mouth daily., Disp: 90 tablet, Rfl: 2   blood glucose meter kit and supplies KIT, Dispense based on patient and insurance preference. Use up to four times daily as directed. (FOR ICD-9 250.00, 250.01)., Disp: 1 each, Rfl: 0   carvedilol (COREG) 25 MG tablet, Take 1 tablet (25 mg total) by mouth 2 (two) times daily with a meal., Disp:  180 tablet, Rfl: 2   Cholecalciferol (VITAMIN D3) 25 MCG (1000 UT) CAPS, Take 1 capsule by mouth daily., Disp: , Rfl:    cloNIDine (CATAPRES) 0.2 MG tablet, Take 0.2 mg by mouth 2 (two) times daily., Disp: , Rfl:    folic acid (FOLVITE) 1 MG tablet, Take 1 mg by mouth daily., Disp: , Rfl:    furosemide (LASIX) 20 MG tablet, Take 2 tablets (40 mg total) by mouth 2 (two) times daily. Take in the morning and afternoon, Disp: 90 tablet, Rfl: 3   glipiZIDE (GLUCOTROL) 10 MG tablet, Take 10 mg by mouth daily., Disp: , Rfl:    HYDROcodone-acetaminophen (NORCO) 5-325 MG tablet, Take 1 tablet by mouth every 6 (six) hours as needed for moderate pain., Disp: 30 tablet, Rfl: 0   hydrOXYzine (VISTARIL) 25 MG capsule, Take 25 mg by mouth at bedtime., Disp: , Rfl:    ibuprofen (ADVIL) 800 MG tablet, Take 1 tablet (800 mg total) by mouth every 6 (six) hours as needed., Disp: 60 tablet, Rfl: 1   insulin aspart protamine- aspart (NOVOLOG MIX 70/30) (70-30) 100 UNIT/ML injection, Inject 0.15 mLs (15 Units total) into the skin 2 (two) times daily with a meal. (Patient taking differently: Inject 15-17 Units into the skin See admin  instructions. Inject 15 units subcutaneous in the morning, then inject 17 units subcutaneous in the evening), Disp: 10 mL, Rfl: 11   Insulin Syringes, Disposable, U-100 1 ML MISC, 1 application by Does not apply route 2 (two) times a day., Disp: 100 each, Rfl: 0   ipratropium-albuterol (DUONEB) 0.5-2.5 (3) MG/3ML SOLN, Take 3 mLs by nebulization every 4 (four) hours as needed., Disp: 360 mL, Rfl: 0   isosorbide-hydrALAZINE (BIDIL) 20-37.5 MG tablet, Take 2 tablets by mouth 3 (three) times daily., Disp: 540 tablet, Rfl: 3   ketorolac (TORADOL) 10 MG tablet, Take 1 tablet (10 mg total) by mouth every 6 (six) hours as needed., Disp: 20 tablet, Rfl: 0   metFORMIN (GLUCOPHAGE) 1000 MG tablet, Take 1,000 mg by mouth 2 (two) times daily., Disp: , Rfl:    oxyCODONE-acetaminophen (PERCOCET) 5-325 MG  tablet, Take 1-2 tablets by mouth every 4 (four) hours as needed for severe pain., Disp: 30 tablet, Rfl: 0   rosuvastatin (CRESTOR) 40 MG tablet, Take 40 mg by mouth daily., Disp: , Rfl:    vitamin B-12 (CYANOCOBALAMIN) 1000 MCG tablet, Take 1,000 mcg by mouth daily., Disp: , Rfl:   Social History   Tobacco Use  Smoking Status Former   Packs/day: 0.50   Years: 30.00   Pack years: 15.00   Types: Cigarettes   Quit date: 2017   Years since quitting: 5.6  Smokeless Tobacco Never    Not on File Objective:  There were no vitals filed for this visit. There is no height or weight on file to calculate BMI. Constitutional Well developed. Well nourished.  Vascular Foot warm and well perfused. Capillary refill normal to all digits.   Neurologic Normal speech. Oriented to person, place, and time. Epicritic sensation to light touch grossly present bilaterally.  Dermatologic Skin completely epithelialized.  Good correction noted.  Range of motion at 90 degrees of the ankle joint.  Orthopedic: Tenderness to palpation noted about the surgical site.   Radiographs: None Assessment:   1. Type 2 diabetes mellitus with diabetic polyneuropathy, with long-term current use of insulin ([place])   2. Achilles tendon tear, left, initial encounter   3. Gastrocnemius equinus, left     Plan:  Patient was evaluated and treated and all questions answered.  S/p foot surgery left -Progressing as expected post-operatively. -XR: See above -WB Status: Weightbearing as tolerated in a cam boot -Sutures/staples: None -Medications: None -Prescription for physical therapy was given.  I encouraged physical therapy aggressively.  No follow-ups on file.

## 2021-03-17 ENCOUNTER — Other Ambulatory Visit: Payer: Self-pay | Admitting: Cardiology

## 2021-03-17 DIAGNOSIS — I10 Essential (primary) hypertension: Secondary | ICD-10-CM

## 2021-03-22 ENCOUNTER — Encounter (INDEPENDENT_AMBULATORY_CARE_PROVIDER_SITE_OTHER): Payer: Medicaid Other | Admitting: Ophthalmology

## 2021-03-22 DIAGNOSIS — H25813 Combined forms of age-related cataract, bilateral: Secondary | ICD-10-CM

## 2021-03-22 DIAGNOSIS — H35033 Hypertensive retinopathy, bilateral: Secondary | ICD-10-CM

## 2021-03-22 DIAGNOSIS — E113413 Type 2 diabetes mellitus with severe nonproliferative diabetic retinopathy with macular edema, bilateral: Secondary | ICD-10-CM

## 2021-03-22 DIAGNOSIS — H3581 Retinal edema: Secondary | ICD-10-CM

## 2021-03-22 DIAGNOSIS — G932 Benign intracranial hypertension: Secondary | ICD-10-CM

## 2021-03-22 DIAGNOSIS — I10 Essential (primary) hypertension: Secondary | ICD-10-CM

## 2021-03-22 DIAGNOSIS — Z8673 Personal history of transient ischemic attack (TIA), and cerebral infarction without residual deficits: Secondary | ICD-10-CM

## 2021-03-24 NOTE — Progress Notes (Signed)
[facility] Note  03/26/2021     CHIEF COMPLAINT Patient presents for Retina Follow Up   HISTORY OF PRESENT ILLNESS: [name] is a 50 y.o. female who presents to the clinic today for:   HPI     Retina Follow Up   Patient presents with  Diabetic Retinopathy.  In both eyes.  This started weeks ago.  Severity is moderate.  Duration of weeks.  Since onset it is gradually worsening.  I, the attending physician,  performed the HPI with the patient and updated documentation appropriately.        Comments   BS: 120 3 days ago A1c: 6.3 Pt states BP has been high.  BP in office today: 173/85 (RAS)--took all meds including BP meds this morning at 6AM.  Pt states her vision has been blurry in both eyes.  States vision is better with glasses on but still cannot see clearly.  Pt denies eye pain.  Pt denies any new or worsening floaters or fol OU.        Last edited by Bernarda Caffey, MD on 03/26/2021 10:13 AM.     pt was in the hospital until yesterday due to bleeding for 6 weeks straight, she states her drs did not catch it through any of her appts until Wednesday when she went to see her endocrinologist who sent her straight to the ED bc her hemoglobin was 6, pt had a blood transfusion in the ED with one unit of blood and was given iron pills, pt is now anemic and BP in the office today is 140/60, pt states she has a lot of floaters that are bothering her, pt states she has gotten a new glasses rx from Dr. Frederico Hamman, but he did not mention the swelling on her nerve  Patient states:  Patient does not think she has had a lumbar puncture.  She does not remember.  Referring physician: Gevena Cotton, MD [address],  [place] [address]  HISTORICAL INFORMATION:   Selected notes from the MEDICAL RECORD NUMBER Referred by Dr. Gevena Cotton for concern of PDR OU   CURRENT MEDICATIONS: No current outpatient medications on file.  (Ophthalmic Drugs)   No current facility-administered medications for this visit. (Ophthalmic Drugs)   Current Outpatient Medications (Other)  Medication Sig   Accu-Chek FastClix Lancets MISC 4 (four) times daily.   ACCU-CHEK GUIDE test strip 1 each by Other route See admin instructions. for testing   albuterol (VENTOLIN HFA) 108 (90 Base) MCG/ACT inhaler Inhale into the lungs every 6 (six) hours as needed for wheezing or shortness of breath.   amLODipine-benazepril (LOTREL) 10-40 MG capsule TAKE 1 CAPSULE BY MOUTH EVERY DAY   aspirin 81 MG EC tablet Take 1 tablet (81 mg total) by mouth daily.   blood glucose meter kit and supplies KIT Dispense based on patient and insurance preference. Use up to four times daily as directed. (FOR ICD-9 250.00, 250.01).   carvedilol (COREG) 25 MG tablet Take 1 tablet (25 mg total) by mouth 2 (two) times daily with a meal.   Cholecalciferol (VITAMIN D3) 25 MCG (1000 UT) CAPS Take 1 capsule by mouth daily.   cloNIDine (CATAPRES) 0.2 MG tablet Take 0.2 mg by mouth 2 (two) times daily.   folic acid (FOLVITE) 1 MG tablet Take 1 mg by mouth daily.   furosemide (LASIX) 20 MG tablet Take 2 tablets (40 mg total) by mouth 2 (two) times  daily. Take in the morning and afternoon   glipiZIDE (GLUCOTROL) 10 MG tablet Take 10 mg by mouth daily.   HYDROcodone-acetaminophen (NORCO) 5-325 MG tablet Take 1 tablet by mouth every 6 (six) hours as needed for moderate pain.   hydrOXYzine (VISTARIL) 25 MG capsule Take 25 mg by mouth at bedtime.   ibuprofen (ADVIL) 800 MG tablet Take 1 tablet (800 mg total) by mouth every 6 (six) hours as needed.   insulin aspart protamine- aspart (NOVOLOG MIX 70/30) (70-30) 100 UNIT/ML injection Inject 0.15 mLs (15 Units total) into the skin 2 (two) times daily with a meal. (Patient taking differently: Inject 15-17 Units into the skin See admin instructions. Inject 15 units subcutaneous in the morning, then inject 17 units subcutaneous in the evening)    Insulin Syringes, Disposable, Y-101 1 ML MISC 1 application by Does not apply route 2 (two) times a day.   ipratropium-albuterol (DUONEB) 0.5-2.5 (3) MG/3ML SOLN Take 3 mLs by nebulization every 4 (four) hours as needed.   isosorbide-hydrALAZINE (BIDIL) 20-37.5 MG tablet Take 2 tablets by mouth 3 (three) times daily.   ketorolac (TORADOL) 10 MG tablet Take 1 tablet (10 mg total) by mouth every 6 (six) hours as needed.   metFORMIN (GLUCOPHAGE) 1000 MG tablet Take 1,000 mg by mouth 2 (two) times daily.   oxyCODONE-acetaminophen (PERCOCET) 5-325 MG tablet Take 1-2 tablets by mouth every 4 (four) hours as needed for severe pain.   rosuvastatin (CRESTOR) 40 MG tablet Take 40 mg by mouth daily.   vitamin B-12 (CYANOCOBALAMIN) 1000 MCG tablet Take 1,000 mcg by mouth daily.   No current facility-administered medications for this visit. (Other)   REVIEW OF SYSTEMS: ROS   Positive for: Neurological, Genitourinary, Endocrine, Eyes, Respiratory Negative for: Constitutional, Gastrointestinal, Skin, Musculoskeletal, HENT, Cardiovascular, Psychiatric, Allergic/Imm, Heme/Lymph Last edited by Doneen Poisson on 03/26/2021  8:53 AM.      ALLERGIES Not on File  PAST MEDICAL HISTORY Past Medical History:  Diagnosis Date   Arthritis    Asthma    Cataract    Mixed form OU   CKD (chronic kidney disease)    Coronary artery disease    Diabetes mellitus    Diabetic retinopathy ([place])    NPDR OU   Hyperlipidemia    Hypertension    Hypertensive retinopathy    OU   Left thyroid nodule    diagnosed 07/2018   PAC (premature atrial contraction) 02/15/2021   Pseudotumor cerebri    Stroke ([place])    Vitamin D deficiency    Past Surgical History:  Procedure Laterality Date   ACHILLES TENDON REPAIR     ACHILLES TENDON SURGERY Left 02/19/2021   Procedure: ACHILLES TENDON REPAIR WITH GRAFT;  Surgeon: Felipa Furnace, DPM;  Location: [facility];  Service: Podiatry;  Laterality: Left;  BLOCK    GASTROC RECESSION EXTREMITY Left 02/19/2021   Procedure: GASTROC RECESSION EXTREMITY;  Surgeon: Felipa Furnace, DPM;  Location: [facility];  Service: Podiatry;  Laterality: Left;  Block   TUBAL LIGATION     FAMILY HISTORY Family History  Problem Relation Age of Onset   Asthma Mother    Diabetes Father    Hypertension Father    Kidney disease Father    Hypertension Sister    Asthma Sister    Congestive Heart Failure Maternal Aunt    Diabetes Maternal Grandmother    Congestive Heart Failure Maternal Grandmother    Lung disease Maternal Grandmother    Asthma  Other    Hyperlipidemia Other    Hypertension Other    Cancer Other    SOCIAL HISTORY Social History   Tobacco Use   Smoking status: Former    Packs/day: 0.50    Years: 30.00    Pack years: 15.00    Types: Cigarettes    Quit date: 2017    Years since quitting: 5.6   Smokeless tobacco: Never  Vaping Use   Vaping Use: Never used  Substance Use Topics   Alcohol use: Not Currently    Alcohol/week: 0.0 standard drinks    Comment: rare   Drug use: Yes    Types: Marijuana    Comment: daily         OPHTHALMIC EXAM:  Base Eye Exam     Visual Acuity (Snellen - Linear)       Right Left   Dist cc 20/60 -2 20/70 -1   Dist ph cc 20/50 -1 20/60 -2    Correction: Glasses         Tonometry (Tonopen, 9:03 AM)       Right Left   Pressure 20 23         Pupils       Dark Light Shape React APD   Right 3 2 Round Minimal 0   Left 3 2 Round Minimal 0         Visual Fields       Left Right   Restrictions Partial outer superior nasal deficiency Partial outer superior temporal, inferior nasal deficiencies         Extraocular Movement       Right Left    Full Full         Neuro/Psych     Oriented x3: Yes   Mood/Affect: Normal         Dilation     Both eyes: 1.0% Mydriacyl, 2.5% Phenylephrine @ 9:03 AM           Slit Lamp and Fundus Exam     Slit Lamp Exam        Right Left   Lids/Lashes Mild Meibomian gland dysfunction Mild Meibomian gland dysfunction   Conjunctiva/Sclera Melanosis Melanosis   Cornea Focal NV and pigment at 0530 limbus Trace Punctate epithelial erosions   Anterior Chamber Deep and clear, narrow temporal angle Deep and clear, narrow temporal angle   Iris Round and moderately dilated to 21mm, No NVI Round and moderately dilated to 50mm, No NVI   Lens 1-2+ Nuclear sclerosis, 1-2+ Cortical cataract 1-2+ Nuclear sclerosis, 1-2+ Cortical cataract   Vitreous Mild Vitreous syneresis Mild Vitreous syneresis         Fundus Exam       Right Left   Disc Mild pallor, sharp rim, no edema Sharp margin, +edema (increased from prior), +/heme/CWS pild temp ppa   C/D Ratio 0.2 0.4   Macula Flat, blunted foveal reflex, mild RPE mottling, CWS and flame heme nasal macula -- improved, scattered MA Flat, blunted foveal reflex, +cystic changes +flame hemes and CWS nasal macula -- improved   Vessels attenuated, Tortuous, mild Copper wiring Vascular attenuation, Tortuous, mild AV crossing changes   Periphery Attached, scattered MA/IRH / DBH -- mostly posterior, early flat NV SN to disc, focal pigmented CR scar at 1030 Attached, scattered MA/ DBH/ mild CWS            Refraction     Wearing Rx       Sphere Cylinder Axis   Right -  1.00 +2.00 025   Left -1.25 +0.50 145            IMAGING AND PROCEDURES  Imaging and Procedures for $RemoveBefore'@TODAY'[id number]$ @  OCT, Retina - OU - Both Eyes       Right Eye Quality was good. Central Foveal Thickness: 213. Progression has improved. Findings include normal foveal contour, no SRF, vitreomacular adhesion (Interval improvement in non-central cystic changes and disc edema. Mild defuse retinal thinning.).   Left Eye Quality was good. Central Foveal Thickness: 220. Progression has improved. Findings include abnormal foveal contour, intraretinal fluid, intraretinal hyper-reflective material, no SRF, vitreomacular adhesion  (Mild interval improvement in IRF temporal fovea, interval improvement in disc edema. Mild defuse retinal thinning.).   Notes *Images captured and stored on drive  Diagnosis / Impression:  OD: Interval improvement in non-central cystic changes and disc edema. Mild defuse retinal thinning.  OS: Mild interval improvement in IRF temporal fovea, interval improvement in disc edema. Mild defuse retinal thinning.  Clinical management:  See below  Abbreviations: NFP - Normal foveal profile. CME - cystoid macular edema. PED - pigment epithelial detachment. IRF - intraretinal fluid. SRF - subretinal fluid. EZ - ellipsoid zone. ERM - epiretinal membrane. ORA - outer retinal atrophy. ORT - outer retinal tubulation. SRHM - subretinal hyper-reflective material            ASSESSMENT/PLAN:    ICD-10-CM   1. Severe nonproliferative diabetic retinopathy of both eyes with macular edema associated with type 2 diabetes mellitus ([place])  X73.5329     2. Retinal edema  H35.81 OCT, Retina - OU - Both Eyes    3. Essential hypertension  I10     4. Hypertensive retinopathy of both eyes  H35.033     5. History of stroke  Z86.73     6. Pseudotumor cerebri  G93.2     7. Combined forms of age-related cataract of both eyes  H25.813        1,2. 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  - exam shows scattered IRH and CWS OU  - OCT OD: Interval improvement in non-central cystic changes and disc edema. Mild diffuse retinal thinning. OS: Mild interval improvement in IRF temporal fovea, interval improvement in disc edema. Mild diffuse retinal thinning.  - FA (04.20.21) shows delayed filling time OS; no frank NV, but late leaking MA and scattered patches of capillary nonperfusion OU  - discussed findings and prognosis  - may need anti-VEGF for macular edema, but will hold off for now while BP is being worked on  - also has history of being in the hospital for anemia -- had history of vaginal  bleeding -- received RBC transfusion   - f/u 4-6 weeks, DFE, OCT, FA transit OS  3-5. Hypertensive retinopathy OU with history of stroke  - stroke July 2020 -- ischemic non-hemorrhagic infarct involving L lateral midbrain/pons  - BP in office on 11.23.2020 was  209/112 (left arm)  - 01.20.21, bp 160/87  - BP this morning (taken in office) 140/60  - repeat MRI brain done 01.12.21, which showed new subacute infarction R frontal parasagittal and anterior coprus callosum  - pt reports recent changes in BP meds  - discussed importance of tight BP control  - monitor  6. History of pseudotumor cerebri  - exam and OCT show interval improvement in optic disc edema OU from prior  - under the expert management and monitoring of Dr. Frederico Hamman  7. Mixed form age related cataract OU  -  The symptoms of cataract, surgical options, and treatments and risks were discussed with patient.  - discussed diagnosis and progression  - not yet visually significant  - monitor for now  Ophthalmic Meds Ordered this visit:  No orders of the defined types were placed in this encounter.     Return for Return in 4-6 weeks DFE, OCT, FA transit OS.  There are no Patient Instructions on file for this visit.   Explained the diagnoses, plan, and follow up with the patient and they expressed understanding.  Patient expressed understanding of the importance of proper follow up care.   This document serves as a record of services personally performed by Gardiner Sleeper, MD, PhD. It was created on their behalf by Roselee Nova, COMT. The creation of this record is the provider's dictation and/or activities during the visit.  Electronically signed by: Roselee Nova, COMT 03/30/21 12:06 AM  This document serves as a record of services personally performed by Gardiner Sleeper, MD, PhD. It was created on their behalf by Leonie Douglas, an ophthalmic technician. The creation of this record is the provider's dictation and/or  activities during the visit.    Electronically signed by: Leonie Douglas COA, 03/30/21  12:06 AM  Gardiner Sleeper, M.D., Ph.D. Diseases & Surgery of the Retina and Vitreous [facility]\  I have reviewed the above documentation for accuracy and completeness, and I agree with the above. Gardiner Sleeper, M.D., Ph.D. 03/30/21 12:06 AM   Abbreviations: M myopia (nearsighted); A astigmatism; H hyperopia (farsighted); P presbyopia; Mrx spectacle prescription;  CTL contact lenses; OD right eye; OS left eye; OU both eyes  XT exotropia; ET esotropia; PEK punctate epithelial keratitis; PEE punctate epithelial erosions; DES dry eye syndrome; MGD meibomian gland dysfunction; ATs artificial tears; PFAT's preservative free artificial tears; [place] nuclear sclerotic cataract; PSC posterior subcapsular cataract; ERM epi-retinal membrane; PVD posterior vitreous detachment; RD retinal detachment; DM diabetes mellitus; DR diabetic retinopathy; NPDR non-proliferative diabetic retinopathy; PDR proliferative diabetic retinopathy; CSME clinically significant macular edema; DME diabetic macular edema; dbh dot blot hemorrhages; CWS cotton wool spot; POAG primary open angle glaucoma; C/D cup-to-disc ratio; HVF humphrey visual field; GVF goldmann visual field; OCT optical coherence tomography; IOP intraocular pressure; BRVO Branch retinal vein occlusion; CRVO central retinal vein occlusion; CRAO central retinal artery occlusion; BRAO branch retinal artery occlusion; RT retinal tear; SB scleral buckle; PPV pars plana vitrectomy; VH Vitreous hemorrhage; PRP panretinal laser photocoagulation; IVK intravitreal kenalog; VMT vitreomacular traction; MH Macular hole;  NVD neovascularization of the disc; NVE neovascularization elsewhere; AREDS age related eye disease study; ARMD age related macular degeneration; POAG primary open angle glaucoma; EBMD epithelial/anterior basement membrane dystrophy; ACIOL anterior chamber  intraocular lens; IOL intraocular lens; PCIOL posterior chamber intraocular lens; Phaco/IOL phacoemulsification with intraocular lens placement; [place] photorefractive keratectomy; LASIK laser assisted in situ keratomileusis; HTN hypertension; DM diabetes mellitus; COPD chronic obstructive pulmonary disease

## 2021-03-26 ENCOUNTER — Other Ambulatory Visit: Payer: Self-pay

## 2021-03-26 ENCOUNTER — Encounter (INDEPENDENT_AMBULATORY_CARE_PROVIDER_SITE_OTHER): Payer: Self-pay | Admitting: Ophthalmology

## 2021-03-26 ENCOUNTER — Ambulatory Visit (INDEPENDENT_AMBULATORY_CARE_PROVIDER_SITE_OTHER): Payer: Medicaid Other | Admitting: Ophthalmology

## 2021-03-26 VITALS — BP 173/85 | HR 72

## 2021-03-26 DIAGNOSIS — G932 Benign intracranial hypertension: Secondary | ICD-10-CM

## 2021-03-26 DIAGNOSIS — H35033 Hypertensive retinopathy, bilateral: Secondary | ICD-10-CM

## 2021-03-26 DIAGNOSIS — I10 Essential (primary) hypertension: Secondary | ICD-10-CM

## 2021-03-26 DIAGNOSIS — Z8673 Personal history of transient ischemic attack (TIA), and cerebral infarction without residual deficits: Secondary | ICD-10-CM

## 2021-03-26 DIAGNOSIS — E113413 Type 2 diabetes mellitus with severe nonproliferative diabetic retinopathy with macular edema, bilateral: Secondary | ICD-10-CM | POA: Diagnosis not present

## 2021-03-26 DIAGNOSIS — H3581 Retinal edema: Secondary | ICD-10-CM

## 2021-03-26 DIAGNOSIS — H25813 Combined forms of age-related cataract, bilateral: Secondary | ICD-10-CM

## 2021-04-09 ENCOUNTER — Ambulatory Visit (INDEPENDENT_AMBULATORY_CARE_PROVIDER_SITE_OTHER): Payer: Medicaid Other | Admitting: Podiatry

## 2021-04-09 ENCOUNTER — Other Ambulatory Visit: Payer: Self-pay

## 2021-04-09 DIAGNOSIS — Z794 Long term (current) use of insulin: Secondary | ICD-10-CM | POA: Diagnosis not present

## 2021-04-09 DIAGNOSIS — M216X2 Other acquired deformities of left foot: Secondary | ICD-10-CM

## 2021-04-09 DIAGNOSIS — S86012A Strain of left Achilles tendon, initial encounter: Secondary | ICD-10-CM

## 2021-04-09 DIAGNOSIS — L97512 Non-pressure chronic ulcer of other part of right foot with fat layer exposed: Secondary | ICD-10-CM | POA: Diagnosis not present

## 2021-04-09 DIAGNOSIS — E1142 Type 2 diabetes mellitus with diabetic polyneuropathy: Secondary | ICD-10-CM

## 2021-04-09 DIAGNOSIS — M21862 Other specified acquired deformities of left lower leg: Secondary | ICD-10-CM

## 2021-04-15 ENCOUNTER — Encounter: Payer: Self-pay | Admitting: Podiatry

## 2021-04-15 NOTE — Progress Notes (Signed)
Subjective:  Patient ID: [name], female    DOB: Jun 02, 1971,  MRN: [id number]  Chief Complaint  Patient presents with   Routine Post Op    DOS 7.15.22    DOS: 02/19/2021 Procedure: Left Achilles tendon repair with gastrocnemius recession  50 y.o. female returns for post-op check.  Patient states she is doing well.  She has been weightbearing as tolerated in regular shoes.  She is doing physical therapy.  She states the physical therapy helps a lot.  She is doing much better on the left foot from surgery.  She now has a new complaint of right hallux ulceration.  She has not seen anyone else prior to seeing me.  She would like to discuss treatment options for this.  She denies any other acute complaints.  Review of Systems: Negative except as noted in the HPI. Denies N/V/F/Ch.  Past Medical History:  Diagnosis Date   Arthritis    Asthma    Cataract    Mixed form OU   CKD (chronic kidney disease)    Coronary artery disease    Diabetes mellitus    Diabetic retinopathy ([place])    NPDR OU   Hyperlipidemia    Hypertension    Hypertensive retinopathy    OU   Left thyroid nodule    diagnosed 07/2018   PAC (premature atrial contraction) 02/15/2021   Pseudotumor cerebri    Stroke [facility])    Vitamin D deficiency     Current Outpatient Medications:    Accu-Chek FastClix Lancets MISC, 4 (four) times daily., Disp: , Rfl:    ACCU-CHEK GUIDE test strip, 1 each by Other route See admin instructions. for testing, Disp: 100 each, Rfl: 2   albuterol (VENTOLIN HFA) 108 (90 Base) MCG/ACT inhaler, Inhale into the lungs every 6 (six) hours as needed for wheezing or shortness of breath., Disp: , Rfl:    amLODipine-benazepril (LOTREL) 10-40 MG capsule, TAKE 1 CAPSULE BY MOUTH EVERY DAY, Disp: 90 capsule, Rfl: 2   aspirin 81 MG EC tablet, Take 1 tablet (81 mg total) by mouth daily., Disp: 90 tablet, Rfl: 2   blood glucose meter kit and supplies KIT, Dispense based on patient and insurance  preference. Use up to four times daily as directed. (FOR ICD-9 250.00, 250.01)., Disp: 1 each, Rfl: 0   carvedilol (COREG) 25 MG tablet, Take 1 tablet (25 mg total) by mouth 2 (two) times daily with a meal., Disp: 180 tablet, Rfl: 2   Cholecalciferol (VITAMIN D3) 25 MCG (1000 UT) CAPS, Take 1 capsule by mouth daily., Disp: , Rfl:    cloNIDine (CATAPRES) 0.2 MG tablet, Take 0.2 mg by mouth 2 (two) times daily., Disp: , Rfl:    folic acid (FOLVITE) 1 MG tablet, Take 1 mg by mouth daily., Disp: , Rfl:    furosemide (LASIX) 20 MG tablet, Take 2 tablets (40 mg total) by mouth 2 (two) times daily. Take in the morning and afternoon, Disp: 90 tablet, Rfl: 3   glipiZIDE (GLUCOTROL) 10 MG tablet, Take 10 mg by mouth daily., Disp: , Rfl:    HYDROcodone-acetaminophen (NORCO) 5-325 MG tablet, Take 1 tablet by mouth every 6 (six) hours as needed for moderate pain., Disp: 30 tablet, Rfl: 0   hydrOXYzine (VISTARIL) 25 MG capsule, Take 25 mg by mouth at bedtime., Disp: , Rfl:    ibuprofen (ADVIL) 800 MG tablet, Take 1 tablet (800 mg total) by mouth every 6 (six) hours as needed., Disp: 60 tablet, Rfl: 1  insulin aspart protamine- aspart (NOVOLOG MIX 70/30) (70-30) 100 UNIT/ML injection, Inject 0.15 mLs (15 Units total) into the skin 2 (two) times daily with a meal. (Patient taking differently: Inject 15-17 Units into the skin See admin instructions. Inject 15 units subcutaneous in the morning, then inject 17 units subcutaneous in the evening), Disp: 10 mL, Rfl: 11   Insulin Syringes, Disposable, U-100 1 ML MISC, 1 application by Does not apply route 2 (two) times a day., Disp: 100 each, Rfl: 0   ipratropium-albuterol (DUONEB) 0.5-2.5 (3) MG/3ML SOLN, Take 3 mLs by nebulization every 4 (four) hours as needed., Disp: 360 mL, Rfl: 0   isosorbide-hydrALAZINE (BIDIL) 20-37.5 MG tablet, Take 2 tablets by mouth 3 (three) times daily., Disp: 540 tablet, Rfl: 3   ketorolac (TORADOL) 10 MG tablet, Take 1 tablet (10 mg total) by  mouth every 6 (six) hours as needed., Disp: 20 tablet, Rfl: 0   metFORMIN (GLUCOPHAGE) 1000 MG tablet, Take 1,000 mg by mouth 2 (two) times daily., Disp: , Rfl:    oxyCODONE-acetaminophen (PERCOCET) 5-325 MG tablet, Take 1-2 tablets by mouth every 4 (four) hours as needed for severe pain., Disp: 30 tablet, Rfl: 0   rosuvastatin (CRESTOR) 40 MG tablet, Take 40 mg by mouth daily., Disp: , Rfl:    vitamin B-12 (CYANOCOBALAMIN) 1000 MCG tablet, Take 1,000 mcg by mouth daily., Disp: , Rfl:   Social History   Tobacco Use  Smoking Status Former   Packs/day: 0.50   Years: 30.00   Pack years: 15.00   Types: Cigarettes   Quit date: 2017   Years since quitting: 5.6  Smokeless Tobacco Never    Not on File Objective:  There were no vitals filed for this visit. There is no height or weight on file to calculate BMI. Constitutional Well developed. Well nourished.  Vascular Foot warm and well perfused. Capillary refill normal to all digits.   Neurologic Normal speech. Oriented to person, place, and time. Epicritic sensation to light touch grossly present bilaterally.  Dermatologic Skin completely epithelialized.  Good correction noted.  Range of motion at 90 degrees of the ankle joint.  Wound Location: Right hallux fat layer exposed does not probe down to bone.  Wound Base: Mixed Granular/Fibrotic Peri-wound: Macerated Exudate: Scant/small amount Serosanguinous exudate Wound Measurements: -See below  Orthopedic: No tenderness to palpation noted about the surgical site.   Radiographs: None Assessment:   1. Skin ulcer of right great toe with fat layer exposed ([place])   2. Type 2 diabetes mellitus with diabetic polyneuropathy, with long-term current use of insulin ([place])   3. Achilles tendon tear, left, initial encounter   4. Gastrocnemius equinus, left      Plan:  Patient was evaluated and treated and all questions answered.  S/p foot surgery left -Clinically healed and doing well.   Continue physical therapy at this point as needed.  Patient is officially discharged from surgical standpoint.  If any foot and ankle issues arises from the left side I have asked her to come see me right away.  She states understanding  Ulcer right hallux ulcer with fat layer exposed -Debridement as below. -Dressed with Betadine wet-to-dry, DSD. -Continue off-loading with surgical shoe.  Procedure: Excisional Debridement of Wound Tool: Sharp chisel blade/tissue nipper Rationale: Removal of non-viable soft tissue from the wound to promote healing.  Anesthesia: none Pre-Debridement Wound Measurements: 0.6 cm x 0.7 cm x 0.4 cm  Post-Debridement Wound Measurements: 0.6 cm x 0.8 cm x 0.4 cm  Type  of Debridement: Sharp Excisional Tissue Removed: Non-viable soft tissue Blood loss: Minimal (<50cc) Depth of Debridement: subcutaneous tissue. Technique: Sharp excisional debridement to bleeding, viable wound base.  Wound Progress: This is my initial evaluation I will continue monitor the progression of the wound Site healing conversation 7 Dressing: Dry, sterile, compression dressing. Disposition: Patient tolerated procedure well. Patient to return in 1 week for follow-up.  No follow-ups on file.         No follow-ups on file.

## 2021-04-20 NOTE — Progress Notes (Signed)
[facility] Note  04/23/2021     CHIEF COMPLAINT Patient presents for Retina Follow Up   HISTORY OF PRESENT ILLNESS: [name] is a 50 y.o. female who presents to the clinic today for:   HPI     Retina Follow Up   Patient presents with  Diabetic Retinopathy.  In both eyes.  Duration of 4 weeks.  Since onset it is stable.  I, the attending physician,  performed the HPI with the patient and updated documentation appropriately.        Comments   4 week follow up NPDR OU- Have had a lot of mucous in the eyes.  Denies itching, burning, or tearing.   Had a wound that healed on the bottom of toe.  It opened back up recently.  BS 148 yesterday, has not checked this morning. A1C 6.3      Last edited by Bernarda Caffey, MD on 04/25/2021 10:54 PM.      Referring physician: [name], MD [id number] [place],  [place] [address]  HISTORICAL INFORMATION:   Selected notes from the MEDICAL RECORD NUMBER Referred by Dr. Gevena Cotton for concern of PDR OU   CURRENT MEDICATIONS: No current outpatient medications on file. (Ophthalmic Drugs)   No current facility-administered medications for this visit. (Ophthalmic Drugs)   Current Outpatient Medications (Other)  Medication Sig   albuterol (VENTOLIN HFA) 108 (90 Base) MCG/ACT inhaler Inhale into the lungs every 6 (six) hours as needed for wheezing or shortness of breath.   amLODipine-benazepril (LOTREL) 10-40 MG capsule TAKE 1 CAPSULE BY MOUTH EVERY DAY   aspirin 81 MG EC tablet Take 1 tablet (81 mg total) by mouth daily.   carvedilol (COREG) 25 MG tablet Take 1 tablet (25 mg total) by mouth 2 (two) times daily with a meal.   Cholecalciferol (VITAMIN D3) 25 MCG (1000 UT) CAPS Take 1 capsule by mouth daily.   cloNIDine (CATAPRES) 0.2 MG tablet Take 0.2 mg by mouth 2 (two) times daily.   folic acid (FOLVITE) 1 MG tablet Take 1 mg by mouth daily.   furosemide (LASIX) 20 MG tablet  Take 2 tablets (40 mg total) by mouth 2 (two) times daily. Take in the morning and afternoon   glipiZIDE (GLUCOTROL) 10 MG tablet Take 10 mg by mouth daily.   hydrOXYzine (VISTARIL) 25 MG capsule Take 25 mg by mouth at bedtime.   insulin aspart protamine- aspart (NOVOLOG MIX 70/30) (70-30) 100 UNIT/ML injection Inject 0.15 mLs (15 Units total) into the skin 2 (two) times daily with a meal. (Patient taking differently: Inject 15-17 Units into the skin See admin instructions. Inject 15 units subcutaneous in the morning, then inject 17 units subcutaneous in the evening)   ipratropium-albuterol (DUONEB) 0.5-2.5 (3) MG/3ML SOLN Take 3 mLs by nebulization every 4 (four) hours as needed.   isosorbide-hydrALAZINE (BIDIL) 20-37.5 MG tablet Take 2 tablets by mouth 3 (three) times daily.   ketorolac (TORADOL) 10 MG tablet Take 1 tablet (10 mg total) by mouth every 6 (six) hours as needed.   metFORMIN (GLUCOPHAGE) 1000 MG tablet Take 1,000 mg by mouth 2 (two) times daily.   rosuvastatin (CRESTOR) 40 MG tablet Take 40 mg by mouth daily.   Accu-Chek FastClix Lancets MISC 4 (four) times daily.   ACCU-CHEK GUIDE test strip 1 each by Other route See admin instructions. for testing   blood glucose meter kit and supplies KIT Dispense based on patient and  insurance preference. Use up to four times daily as directed. (FOR ICD-9 250.00, 250.01).   HYDROcodone-acetaminophen (NORCO) 5-325 MG tablet Take 1 tablet by mouth every 6 (six) hours as needed for moderate pain. (Patient not taking: Reported on 04/23/2021)   ibuprofen (ADVIL) 800 MG tablet Take 1 tablet (800 mg total) by mouth every 6 (six) hours as needed. (Patient not taking: Reported on 04/23/2021)   Insulin Syringes, Disposable, V-253 1 ML MISC 1 application by Does not apply route 2 (two) times a day.   oxyCODONE-acetaminophen (PERCOCET) 5-325 MG tablet Take 1-2 tablets by mouth every 4 (four) hours as needed for severe pain. (Patient not taking: Reported on  04/23/2021)   vitamin B-12 (CYANOCOBALAMIN) 1000 MCG tablet Take 1,000 mcg by mouth daily. (Patient not taking: Reported on 04/23/2021)   No current facility-administered medications for this visit. (Other)   REVIEW OF SYSTEMS: ROS   Positive for: Neurological, Genitourinary, Endocrine, Cardiovascular, Eyes, Respiratory Negative for: Constitutional, Gastrointestinal, Skin, Musculoskeletal, HENT, Psychiatric, Allergic/Imm, Heme/Lymph Last edited by Leonie Douglas, COA on 04/23/2021  8:24 AM.       ALLERGIES Not on File  PAST MEDICAL HISTORY Past Medical History:  Diagnosis Date   Arthritis    Asthma    Cataract    Mixed form OU   CKD (chronic kidney disease)    Coronary artery disease    Diabetes mellitus    Diabetic retinopathy ([place])    NPDR OU   Hyperlipidemia    Hypertension    Hypertensive retinopathy    OU   Left thyroid nodule    diagnosed 07/2018   PAC (premature atrial contraction) 02/15/2021   Pseudotumor cerebri    Stroke ([place])    Vitamin D deficiency    Past Surgical History:  Procedure Laterality Date   ACHILLES TENDON REPAIR     ACHILLES TENDON SURGERY Left 02/19/2021   Procedure: ACHILLES TENDON REPAIR WITH GRAFT;  Surgeon: Felipa Furnace, DPM;  Location: [facility];  Service: Podiatry;  Laterality: Left;  BLOCK   GASTROC RECESSION EXTREMITY Left 02/19/2021   Procedure: GASTROC RECESSION EXTREMITY;  Surgeon: Felipa Furnace, DPM;  Location: [facility];  Service: Podiatry;  Laterality: Left;  Block   TUBAL LIGATION     FAMILY HISTORY Family History  Problem Relation Age of Onset   Asthma Mother    Diabetes Father    Hypertension Father    Kidney disease Father    Hypertension Sister    Asthma Sister    Congestive Heart Failure Maternal Aunt    Diabetes Maternal Grandmother    Congestive Heart Failure Maternal Grandmother    Lung disease Maternal Grandmother    Asthma Other    Hyperlipidemia Other    Hypertension Other     Cancer Other    SOCIAL HISTORY Social History   Tobacco Use   Smoking status: Former    Packs/day: 0.50    Years: 30.00    Pack years: 15.00    Types: Cigarettes    Quit date: 2017    Years since quitting: 5.7   Smokeless tobacco: Never  Vaping Use   Vaping Use: Never used  Substance Use Topics   Alcohol use: Not Currently    Alcohol/week: 0.0 standard drinks    Comment: rare   Drug use: Yes    Types: Marijuana    Comment: daily         OPHTHALMIC EXAM:  Base Eye Exam     Visual  Acuity (Snellen - Linear)       Right Left   Dist cc 20/60 +1 20/70 +1   Dist ph cc 20/50 20/60 +1    Correction: Glasses         Tonometry (Tonopen, 8:34 AM)       Right Left   Pressure 18 17         Pupils       Dark Light Shape React APD   Right 3 2 Round Minimal None   Left 3 2 Round Minimal None         Visual Fields (Counting fingers)       Left Right    Full    Restrictions  Partial outer superior temporal deficiency         Extraocular Movement       Right Left    Full Full         Neuro/Psych     Oriented x3: Yes   Mood/Affect: Normal         Dilation     Both eyes: 1.0% Mydriacyl, 2.5% Phenylephrine @ 8:34 AM           Slit Lamp and Fundus Exam     Slit Lamp Exam       Right Left   Lids/Lashes Mild Meibomian gland dysfunction Mild Meibomian gland dysfunction   Conjunctiva/Sclera Melanosis Melanosis   Cornea Focal NV and pigment at 0530 limbus Trace Punctate epithelial erosions   Anterior Chamber Deep and clear, narrow temporal angle Deep and clear, narrow temporal angle   Iris Round and moderately dilated to 46mm, No NVI Round and moderately dilated to 59mm, No NVI   Lens 2+ Nuclear sclerosis, 2+ Cortical cataract 2+ Nuclear sclerosis, 2+ Cortical cataract   Vitreous Mild Vitreous syneresis Mild Vitreous syneresis         Fundus Exam       Right Left   Disc Mild pallor, sharp rim, no edema Pink, Sharp margin, +edema  (increased from prior), +/heme/CWS pild temp ppa   C/D Ratio 0.2 0.4   Macula Flat, good foveal reflex, mild RPE mottling, scattered MA Flat, blunted foveal reflex, +cystic changes temporal to fovea, scattered MA/DBH, +focal laser targets temporal to fovea   Vessels attenuated, Tortuous, mild Copper wiring Vascular attenuation, Tortuous, mild AV crossing changes   Periphery Attached, scattered MA/IRH / DBH -- mostly posterior, early flat NV SN to disc, focal pigmented CR scar at 1030 Attached, scattered MA/ DBH/ mild CWS            Refraction     Wearing Rx       Sphere Cylinder Axis   Right -1.00 +2.00 025   Left -1.25 +0.50 145            IMAGING AND PROCEDURES  Imaging and Procedures for $RemoveBefore'@TODAY'[id number]$ @  OCT, Retina - OU - Both Eyes       Right Eye Quality was good. Central Foveal Thickness: 211. Progression has improved. Findings include normal foveal contour, no SRF, vitreomacular adhesion (Stable improvement in non-central cystic changes and disc edema, Mild diffuse retinal thinning.).   Left Eye Quality was good. Central Foveal Thickness: 219. Progression has improved. Findings include abnormal foveal contour, intraretinal fluid, intraretinal hyper-reflective material, no SRF, vitreomacular adhesion (Persistent IRF temporal fovea -- ?trace improvement improvement in disc edema. Mild diffuse retinal thinning.).   Notes *Images captured and stored on drive  Diagnosis / Impression:  OD: Stable improvement in non-central cystic  changes and disc edema, Mild diffuse retinal thinning. OS: Persistent IRF temporal fovea -- ?trace improvement improvement in disc edema, Mild diffuse retinal thinning  Clinical management:  See below  Abbreviations: NFP - Normal foveal profile. CME - cystoid macular edema. PED - pigment epithelial detachment. IRF - intraretinal fluid. SRF - subretinal fluid. EZ - ellipsoid zone. ERM - epiretinal membrane. ORA - outer retinal atrophy. ORT - outer  retinal tubulation. SRHM - subretinal hyper-reflective material             ASSESSMENT/PLAN:    ICD-10-CM   1. Severe nonproliferative diabetic retinopathy of both eyes with macular edema associated with type 2 diabetes mellitus ([place])  J00.9381     2. Retinal edema  H35.81 OCT, Retina - OU - Both Eyes    3. Essential hypertension  I10     4. Hypertensive retinopathy of both eyes  H35.033     5. History of stroke  Z86.73     6. Pseudotumor cerebri  G93.2     7. Combined forms of age-related cataract of both eyes  H25.813         1,2. 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  - exam shows scattered IRH and CWS OU  - OCT OD: Stable improvement in non-central cystic changes and disc edema, Mild diffuse retinal thinning; OS: Persistent IRF temporal fovea -- ?trace improvement improvement in disc edema, Mild diffuse retinal thinning  - FA (04.20.21) shows delayed filling time OS; no frank NV, but late leaking MA and scattered patches of capillary nonperfusion OU  - discussed findings and prognosis  - may need anti-VEGF for macular edema, but will hold off for now while BP is being worked on -- pt is very phobic of injections, but is open to focal laser  - also has history of being in the hospital for anemia -- had history of vaginal bleeding -- received RBC transfusion   - f/u 6 weeks, DFE, OCT, FA transit OS, possible focal laser  3-5. Hypertensive retinopathy OU with history of stroke  - stroke July 2020 -- ischemic non-hemorrhagic infarct involving L lateral midbrain/pons  - BP in office on 11.23.2020 was  209/112 (left arm)  - 01.20.21, bp 160/80  - repeat MRI brain done 01.12.21, which showed new subacute infarction R frontal parasagittal and anterior coprus callosum  - pt reports recent changes in BP meds  - discussed importance of tight BP control  - monitor   6. History of pseudotumor cerebri  - exam and OCT show interval improvement in optic disc  edema OU from prior   - under the expert management and monitoring of Dr. Frederico Hamman  7. 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  - discussed diagnosis and progression  - not yet visually significant  - monitor for now  Ophthalmic Meds Ordered this visit:  No orders of the defined types were placed in this encounter.     Return in about 6 weeks (around 06/04/2021) for f/u 6 weeks, NPDR OU, DFE, OCT.  There are no Patient Instructions on file for this visit.   Explained the diagnoses, plan, and follow up with the patient and they expressed understanding.  Patient expressed understanding of the importance of proper follow up care.   This document serves as a record of services personally performed by Gardiner Sleeper, MD, PhD. It was created on their behalf by Shirlean Mylar  Nyra Capes, an ophthalmic technician. The creation of this record is the provider's dictation and/or activities during the visit.    Electronically signed by: Leonie Douglas COA, 04/25/21  10:59 PM  This document serves as a record of services personally performed by Gardiner Sleeper, MD, PhD. It was created on their behalf by San Jetty. Owens Shark, OA an ophthalmic technician. The creation of this record is the provider's dictation and/or activities during the visit.    Electronically signed by: San Jetty. Owens Shark, OA @TODAY @ 10:59 PM  Gardiner Sleeper, M.D., Ph.D. Diseases & Surgery of the Retina and [place] 04/23/2021  I have reviewed the above documentation for accuracy and completeness, and I agree with the above. Gardiner Sleeper, M.D., Ph.D. 04/25/21 10:59 PM  Abbreviations: M myopia (nearsighted); A astigmatism; H hyperopia (farsighted); P presbyopia; Mrx spectacle prescription;  CTL contact lenses; OD right eye; OS left eye; OU both eyes  XT exotropia; ET esotropia; PEK punctate epithelial keratitis; PEE  punctate epithelial erosions; DES dry eye syndrome; MGD meibomian gland dysfunction; ATs artificial tears; PFAT's preservative free artificial tears; [place] nuclear sclerotic cataract; PSC posterior subcapsular cataract; ERM epi-retinal membrane; PVD posterior vitreous detachment; RD retinal detachment; DM diabetes mellitus; DR diabetic retinopathy; NPDR non-proliferative diabetic retinopathy; PDR proliferative diabetic retinopathy; CSME clinically significant macular edema; DME diabetic macular edema; dbh dot blot hemorrhages; CWS cotton wool spot; POAG primary open angle glaucoma; C/D cup-to-disc ratio; HVF humphrey visual field; GVF goldmann visual field; OCT optical coherence tomography; IOP intraocular pressure; BRVO Branch retinal vein occlusion; CRVO central retinal vein occlusion; CRAO central retinal artery occlusion; BRAO branch retinal artery occlusion; RT retinal tear; SB scleral buckle; PPV pars plana vitrectomy; VH Vitreous hemorrhage; PRP panretinal laser photocoagulation; IVK intravitreal kenalog; VMT vitreomacular traction; MH Macular hole;  NVD neovascularization of the disc; NVE neovascularization elsewhere; AREDS age related eye disease study; ARMD age related macular degeneration; POAG primary open angle glaucoma; EBMD epithelial/anterior basement membrane dystrophy; ACIOL anterior chamber intraocular lens; IOL intraocular lens; PCIOL posterior chamber intraocular lens; Phaco/IOL phacoemulsification with intraocular lens placement; [place] photorefractive keratectomy; LASIK laser assisted in situ keratomileusis; HTN hypertension; DM diabetes mellitus; COPD chronic obstructive pulmonary disease

## 2021-04-22 ENCOUNTER — Telehealth: Payer: Self-pay | Admitting: Podiatry

## 2021-04-22 ENCOUNTER — Other Ambulatory Visit: Payer: Self-pay | Admitting: Podiatry

## 2021-04-22 DIAGNOSIS — S86012A Strain of left Achilles tendon, initial encounter: Secondary | ICD-10-CM

## 2021-04-22 NOTE — Telephone Encounter (Signed)
Patient stated that she was suppose to have a referral to PT. She stated that it was discussed at her last visit. Please advise

## 2021-04-22 NOTE — Telephone Encounter (Signed)
I called the patient to let her know that she can give them a call.

## 2021-04-22 NOTE — Telephone Encounter (Signed)
Patient called the office stating that her phone has been off for a couple of days and she is concerned about her physical therapy she doesn't know if they have tried to get in touch with her     Call back [phone]

## 2021-04-23 ENCOUNTER — Ambulatory Visit (INDEPENDENT_AMBULATORY_CARE_PROVIDER_SITE_OTHER): Payer: Medicaid Other | Admitting: Ophthalmology

## 2021-04-23 ENCOUNTER — Other Ambulatory Visit: Payer: Self-pay

## 2021-04-23 DIAGNOSIS — Z8673 Personal history of transient ischemic attack (TIA), and cerebral infarction without residual deficits: Secondary | ICD-10-CM

## 2021-04-23 DIAGNOSIS — H3581 Retinal edema: Secondary | ICD-10-CM

## 2021-04-23 DIAGNOSIS — I10 Essential (primary) hypertension: Secondary | ICD-10-CM

## 2021-04-23 DIAGNOSIS — E113413 Type 2 diabetes mellitus with severe nonproliferative diabetic retinopathy with macular edema, bilateral: Secondary | ICD-10-CM | POA: Diagnosis not present

## 2021-04-23 DIAGNOSIS — G932 Benign intracranial hypertension: Secondary | ICD-10-CM

## 2021-04-23 DIAGNOSIS — H35033 Hypertensive retinopathy, bilateral: Secondary | ICD-10-CM | POA: Diagnosis not present

## 2021-04-23 DIAGNOSIS — H25813 Combined forms of age-related cataract, bilateral: Secondary | ICD-10-CM

## 2021-04-25 ENCOUNTER — Encounter (INDEPENDENT_AMBULATORY_CARE_PROVIDER_SITE_OTHER): Payer: Self-pay | Admitting: Ophthalmology

## 2021-04-27 ENCOUNTER — Ambulatory Visit: Payer: Medicaid Other | Attending: Podiatry

## 2021-04-27 ENCOUNTER — Other Ambulatory Visit: Payer: Self-pay

## 2021-04-27 VITALS — BP 134/79

## 2021-04-27 DIAGNOSIS — M6281 Muscle weakness (generalized): Secondary | ICD-10-CM | POA: Insufficient documentation

## 2021-04-27 DIAGNOSIS — R2689 Other abnormalities of gait and mobility: Secondary | ICD-10-CM | POA: Diagnosis present

## 2021-04-27 DIAGNOSIS — M25572 Pain in left ankle and joints of left foot: Secondary | ICD-10-CM

## 2021-04-27 NOTE — Therapy (Signed)
[name], [place], [address] Phone: [phone]   Fax:  [phone]  Physical Therapy Evaluation  Patient Details  Name: [name] MRN: [id number] Date of Birth: 07-06-1971 Referring Provider (PT): Felipa Furnace, [place]   Encounter Date: 04/27/2021   PT End of Session - 04/27/21 1420     Visit Number 1    Number of Visits 24    Date for PT Re-Evaluation 07/20/21    Authorization Type [employer]    PT Start Time 1056   arrived late   PT Stop Time 1130    PT Time Calculation (min) 34 min    Activity Tolerance Patient tolerated treatment well;No increased pain    Behavior During Therapy WFL for tasks assessed/performed             Past Medical History:  Diagnosis Date   Arthritis    Asthma    Cataract    Mixed form OU   CKD (chronic kidney disease)    Coronary artery disease    Diabetes mellitus    Diabetic retinopathy ([place])    NPDR OU   Hyperlipidemia    Hypertension    Hypertensive retinopathy    OU   Left thyroid nodule    diagnosed 07/2018   PAC (premature atrial contraction) 02/15/2021   Pseudotumor cerebri    Stroke ([place])    Vitamin D deficiency     Past Surgical History:  Procedure Laterality Date   ACHILLES TENDON REPAIR     ACHILLES TENDON SURGERY Left 02/19/2021   Procedure: ACHILLES TENDON REPAIR WITH GRAFT;  Surgeon: Felipa Furnace, DPM;  Location: [place];  Service: Podiatry;  Laterality: Left;  BLOCK   GASTROC RECESSION EXTREMITY Left 02/19/2021   Procedure: GASTROC RECESSION EXTREMITY;  Surgeon: Felipa Furnace, DPM;  Location: [facility];  Service: Podiatry;  Laterality: Left;  Block   TUBAL LIGATION      Vitals:   04/27/21 1056  BP: 134/79      Subjective Assessment - 04/27/21 1056     Subjective Pt is a 50 y/o F who presents to PT s/p s/p L achilles tendon repair on 02/19/21 performed by Dr. Posey Pronto. She has been  cleared for out of boot activity, but does have post-op shoe donned on non-operative R foot secondary to diabetic ulceration. She notes that her L foot and L LE generally feel very weak post surgery and her L ankle/calf also feels very tight. Pain right now increases with prolonged standing and walking, with pt being frustrated at current mobility level.    Pertinent History s/p L achilles tendon repair on 02/19/21    Limitations Standing;Walking    How long can you sit comfortably? indefinite    How long can you stand comfortably? "a few hours"    How long can you walk comfortably? "a few hours"    Patient Stated Goals "I want to be able to walk normal" - also wants to decrease pain and improve general mobility, recreational walking    Currently in Pain? Yes    Pain Score 4    8/10 at worst   Pain Location Ankle    Pain Orientation Left;Posterior    Pain Descriptors / Indicators Tightness    Pain Type Surgical pain    Pain Onset More than a month ago    Pain Frequency Intermittent    Aggravating Factors  prolonged standing, walking  Pain Relieving Factors rest                [facility] PT Assessment - 04/27/21 0001       Assessment   Medical Diagnosis S86.012A (ICD-10-CM) - Achilles tendon tear, left, initial encounter    Referring Provider (PT) Felipa Furnace, DPM    Hand Dominance Right    Prior Therapy yes - previous sxs, R achilles repair      Precautions   Precautions None      Restrictions   Weight Bearing Restrictions No      Balance Screen   Has the patient fallen in the past 6 months Yes    How many times? once - while trying to get to knee scooter    Has the patient had a decrease in activity level because of a fear of falling?  No    Is the patient reluctant to leave their home because of a fear of falling?  No      Home Environment   Living Environment Private residence    Living Arrangements Children    Type of [place] Access Level entry     Home Layout One level      Prior Function   Level of Independence Independent;Independent with basic ADLs    Vocation On disability      Cognition   Overall Cognitive Status Within Functional Limits for tasks assessed    Attention Focused      Observation/Other Assessments   Focus on Therapeutic Outcomes (FOTO)  No FOTO - MCD    Other Surveys  Lower Extremity Functional Scale    Lower Extremity Functional Scale  36/80      Transfers   Five time sit to stand comments  23 seconds w/ bilat UE support      Balance   Balance Assessed Yes      Standardized Balance Assessment   Standardized Balance Assessment Timed Up and Go Test      Timed Up and Go Test   Normal TUG (seconds) 19   with LoB towards L     High Level Balance   High Level Balance Comments L SLS: unable;  L Tandem: 5 sec                        Objective measurements completed on examination: See above findings.                PT Education - 04/27/21 1419     Education Details eval findings, outcomes measure scores, POC, HEP    Person(s) Educated Patient    Methods Demonstration;Explanation;Handout    Comprehension Verbalized understanding;Returned demonstration              PT Short Term Goals - 04/27/21 1422       PT SHORT TERM GOAL #1   Title Pt will be compliant and knowledgeable with initial HEP for improved carryover    Baseline initial HEP given    Time 3    Period Weeks    Status New    Target Date 05/18/21               PT Long Term Goals - 04/27/21 1423       PT LONG TERM GOAL #1   Title Pt will improve LEFS score to no less than 48/80 as proxy for functional improvement    Baseline 36/80    Time 12  Period Weeks    Status New    Target Date 07/20/21      PT LONG TERM GOAL #2   Title Pt wil decrease 5xSTS to no greater than 15 seconds for improved functional mobility and LE strength    Baseline 23 seconds w/ bilat UE support    Time 12     Period Weeks    Status New    Target Date 07/20/21      PT LONG TERM GOAL #3   Title Pt will hold L SLS to 20 sec for improved balance and safety    Baseline 5 sec in tandem L back    Time 12    Period Weeks    Status New    Target Date 07/20/21      PT LONG TERM GOAL #4   Title Pt will improve TUG to no greater than 10 seconds for improved balance and decreased fall risk    Baseline 19 seconds with LoB towards L    Time 12    Period Weeks    Status New    Target Date 07/20/21                    Plan - 04/27/21 1433     Clinical Impression Statement Pt is a 50 y/o F who presents to PT s/p s/p L achilles tendon repair performed on 02/19/21. Physical findings are consistent with surgery and recovery timeline, as pt demonstrates weakness and decreased functional mobility. Her TUG and 5xSTS places her at an increased risk for falls, while her LEFS score shows she is operating well below baseline and severely limited functional ability. Pt would benefit from PT services post surgery working on improving balance, strength, and safety post surgery. Will assess response to HEP and progress as tolerated per protocol.    Personal Factors and Comorbidities Comorbidity 3+;Fitness;Past/Current Experience;Transportation;Finances    Comorbidities PMH: CVA, DM II, Diabetic retinopathy, HTN, CKD    Examination-Activity Limitations Stand;Stairs;Lift;Squat;Sit;Locomotion Level;Transfers    Examination-Participation Restrictions Yard Work;Community Activity;Driving;Volunteer    Stability/Clinical Decision Making Stable/Uncomplicated    Clinical Decision Making Low    Rehab Potential Good    PT Frequency 2x / week    PT Duration 12 weeks    PT Treatment/Interventions ADLs/Self Care Home Management;Electrical Stimulation;Cryotherapy;Gait training;Stair training;Functional mobility training;Therapeutic activities;Therapeutic exercise;Balance training;Neuromuscular re-education;Patient/family  education;Manual techniques;Vasopneumatic Device;Taping    PT Next Visit Plan assess response to HEP; progress per protocol    PT Home Exercise Plan Access Code: [email or web address]    Consulted and Agree with Plan of Care Patient             Patient will benefit from skilled therapeutic intervention in order to improve the following deficits and impairments:  Abnormal gait, Decreased activity tolerance, Decreased balance, Decreased endurance, Decreased mobility, Decreased range of motion, Decreased strength, Difficulty walking, Pain  Visit Diagnosis: Pain in left ankle and joints of left foot  Muscle weakness (generalized)  Other abnormalities of gait and mobility     Problem List Patient Active Problem List   Diagnosis Date Noted   PAC (premature atrial contraction) 02/15/2021   Symptomatic anemia 06/25/2020   Chronic heart failure with preserved ejection fraction ([place]) 06/25/2020   Educated about COVID-19 virus infection 04/29/2020   Class 2 severe obesity with serious comorbidity and body mass index (BMI) of 36.0 to 36.9 in adult ([place]) 03/30/2020   NSVT (nonsustained ventricular tachycardia) ([place]) 11/13/2019   Pericardial effusion 09/17/2019   H/O:  stroke 04/09/2019   Left ventricular hypertrophy 04/09/2019   Mixed hyperlipidemia 04/08/2019   Uncontrolled type 2 diabetes mellitus with hyperglycemia ([place]) 04/08/2019   Non compliance w medication regimen 02/20/2019   Diabetes mellitus type 2 in obese ([place])    Resistant hypertension    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[place]) 02/13/2019   Cerebrovascular accident (CVA) ([place])    Hypokalemia    Hypomagnesemia    AKI (acute kidney injury) ([place])    Acute CVA (cerebrovascular accident) ([place]) 02/11/2019   Hypertensive urgency 02/11/2019   ARF (acute renal failure) ([place]) 02/11/2019   Type 2 diabetes mellitus with vascular disease ([place]) 02/11/2019   Diabetes mellitus, type 2 ([place]) 04/12/2018   Essential hypertension 01/08/2018   Obstructive sleep  apnea 08/26/2017    Ward Chatters, PT 04/27/2021, 2:37 PM  [facility] [address] [place], [place], [address] Phone: [phone]   Fax:  [phone]  Name: FAYLEE SELLERS MRN: [id number] Date of Birth: 01-04-1971  Check all possible CPT codes: [id number]- Therapeutic Exercise, [phone]- Neuro Re-education, [phone] - Gait Training, [phone] - Manual Therapy, [phone] - Therapeutic Activities, and [phone] - Self Care

## 2021-04-28 ENCOUNTER — Ambulatory Visit (INDEPENDENT_AMBULATORY_CARE_PROVIDER_SITE_OTHER): Payer: Medicaid Other | Admitting: Podiatry

## 2021-04-28 ENCOUNTER — Encounter: Payer: Self-pay | Admitting: Podiatry

## 2021-04-28 DIAGNOSIS — L97512 Non-pressure chronic ulcer of other part of right foot with fat layer exposed: Secondary | ICD-10-CM | POA: Diagnosis not present

## 2021-04-28 DIAGNOSIS — M86471 Chronic osteomyelitis with draining sinus, right ankle and foot: Secondary | ICD-10-CM

## 2021-04-28 NOTE — Progress Notes (Signed)
Subjective:  Patient ID: [name], female    DOB: June 22, 1971,  MRN: [id number]  Chief Complaint  Patient presents with   Foot Ulcer    Right hallux ulcer      50 y.o. female returns for post-op check.  Patient states she is doing well.  She has been weightbearing as tolerated in regular shoes.  She states she is doing well.  She is still has been doing Betadine wet-to-dry dressing changes to the right hallux.  The left side is doing well.  No acute complaints.  Review of Systems: Negative except as noted in the HPI. Denies N/V/F/Ch.  Past Medical History:  Diagnosis Date   Arthritis    Asthma    Cataract    Mixed form OU   CKD (chronic kidney disease)    Coronary artery disease    Diabetes mellitus    Diabetic retinopathy ([place])    NPDR OU   Hyperlipidemia    Hypertension    Hypertensive retinopathy    OU   Left thyroid nodule    diagnosed 07/2018   PAC (premature atrial contraction) 02/15/2021   Pseudotumor cerebri    Stroke [facility])    Vitamin D deficiency     Current Outpatient Medications:    Accu-Chek FastClix Lancets MISC, 4 (four) times daily., Disp: , Rfl:    ACCU-CHEK GUIDE test strip, 1 each by Other route See admin instructions. for testing, Disp: 100 each, Rfl: 2   albuterol (VENTOLIN HFA) 108 (90 Base) MCG/ACT inhaler, Inhale into the lungs every 6 (six) hours as needed for wheezing or shortness of breath., Disp: , Rfl:    amLODipine-benazepril (LOTREL) 10-40 MG capsule, TAKE 1 CAPSULE BY MOUTH EVERY DAY, Disp: 90 capsule, Rfl: 2   aspirin 81 MG EC tablet, Take 1 tablet (81 mg total) by mouth daily., Disp: 90 tablet, Rfl: 2   blood glucose meter kit and supplies KIT, Dispense based on patient and insurance preference. Use up to four times daily as directed. (FOR ICD-9 250.00, 250.01)., Disp: 1 each, Rfl: 0   carvedilol (COREG) 25 MG tablet, Take 1 tablet (25 mg total) by mouth 2 (two) times daily with a meal., Disp: 180 tablet, Rfl: 2   Cholecalciferol  (VITAMIN D3) 25 MCG (1000 UT) CAPS, Take 1 capsule by mouth daily., Disp: , Rfl:    cloNIDine (CATAPRES) 0.2 MG tablet, Take 0.2 mg by mouth 2 (two) times daily., Disp: , Rfl:    folic acid (FOLVITE) 1 MG tablet, Take 1 mg by mouth daily., Disp: , Rfl:    furosemide (LASIX) 20 MG tablet, Take 2 tablets (40 mg total) by mouth 2 (two) times daily. Take in the morning and afternoon, Disp: 90 tablet, Rfl: 3   glipiZIDE (GLUCOTROL) 10 MG tablet, Take 10 mg by mouth daily., Disp: , Rfl:    HYDROcodone-acetaminophen (NORCO) 5-325 MG tablet, Take 1 tablet by mouth every 6 (six) hours as needed for moderate pain. (Patient not taking: Reported on 04/23/2021), Disp: 30 tablet, Rfl: 0   hydrOXYzine (VISTARIL) 25 MG capsule, Take 25 mg by mouth at bedtime., Disp: , Rfl:    ibuprofen (ADVIL) 800 MG tablet, Take 1 tablet (800 mg total) by mouth every 6 (six) hours as needed. (Patient not taking: Reported on 04/23/2021), Disp: 60 tablet, Rfl: 1   insulin aspart protamine- aspart (NOVOLOG MIX 70/30) (70-30) 100 UNIT/ML injection, Inject 0.15 mLs (15 Units total) into the skin 2 (two) times daily with a meal. (Patient taking  differently: Inject 15-17 Units into the skin See admin instructions. Inject 15 units subcutaneous in the morning, then inject 17 units subcutaneous in the evening), Disp: 10 mL, Rfl: 11   Insulin Syringes, Disposable, U-100 1 ML MISC, 1 application by Does not apply route 2 (two) times a day., Disp: 100 each, Rfl: 0   ipratropium-albuterol (DUONEB) 0.5-2.5 (3) MG/3ML SOLN, Take 3 mLs by nebulization every 4 (four) hours as needed., Disp: 360 mL, Rfl: 0   isosorbide-hydrALAZINE (BIDIL) 20-37.5 MG tablet, Take 2 tablets by mouth 3 (three) times daily., Disp: 540 tablet, Rfl: 3   ketorolac (TORADOL) 10 MG tablet, Take 1 tablet (10 mg total) by mouth every 6 (six) hours as needed., Disp: 20 tablet, Rfl: 0   metFORMIN (GLUCOPHAGE) 1000 MG tablet, Take 1,000 mg by mouth 2 (two) times daily., Disp: , Rfl:     oxyCODONE-acetaminophen (PERCOCET) 5-325 MG tablet, Take 1-2 tablets by mouth every 4 (four) hours as needed for severe pain. (Patient not taking: Reported on 04/23/2021), Disp: 30 tablet, Rfl: 0   rosuvastatin (CRESTOR) 40 MG tablet, Take 40 mg by mouth daily., Disp: , Rfl:    vitamin B-12 (CYANOCOBALAMIN) 1000 MCG tablet, Take 1,000 mcg by mouth daily. (Patient not taking: Reported on 04/23/2021), Disp: , Rfl:   Social History   Tobacco Use  Smoking Status Former   Packs/day: 0.50   Years: 30.00   Pack years: 15.00   Types: Cigarettes   Quit date: 2017   Years since quitting: 5.7  Smokeless Tobacco Never    Not on File Objective:  There were no vitals filed for this visit. There is no height or weight on file to calculate BMI. Constitutional Well developed. Well nourished.  Vascular Foot warm and well perfused. Capillary refill normal to all digits.   Neurologic Normal speech. Oriented to person, place, and time. Epicritic sensation to light touch grossly present bilaterally.  Dermatologic Skin completely epithelialized.  Good correction noted.  Range of motion at 90 degrees of the ankle joint.  Wound Location: Right hallux fat layer exposed does not probe down to bone.  Wound Base: Mixed Granular/Fibrotic Peri-wound: Macerated Exudate: Scant/small amount Serosanguinous exudate Wound Measurements: -See below  Orthopedic: No tenderness to palpation noted about the surgical site.   Radiographs: None Assessment:   1. Skin ulcer of right great toe with fat layer exposed ([place])   2. Chronic osteomyelitis of right foot with draining sinus ([facility])      Plan:  Patient was evaluated and treated and all questions answered.  S/p foot surgery left -Clinically healed and doing well.  Continue physical therapy at this point as needed.  Patient is officially discharged from surgical standpoint.  If any foot and ankle issues arises from the left side I have asked her to come see me  right away.  She states understanding  Ulcer right hallux ulcer with fat layer exposed -Debridement as below. -Dressed with Betadine wet-to-dry, DSD. -Continue off-loading with surgical shoe. -MRI was ordered to rule out osteomyelitis of the great toe  Procedure: Excisional Debridement of Wound~stagnant Tool: Sharp chisel blade/tissue nipper Rationale: Removal of non-viable soft tissue from the wound to promote healing.  Anesthesia: none Pre-Debridement Wound Measurements: 0.6 cm x 0.7 cm x 0.4 cm  Post-Debridement Wound Measurements: 0.6 cm x 0.8 cm x 0.4 cm  Type of Debridement: Sharp Excisional Tissue Removed: Non-viable soft tissue Blood loss: Minimal (<50cc) Depth of Debridement: subcutaneous tissue. Technique: Sharp excisional debridement to bleeding, viable wound  base.  Wound Progress: The wound is stagnant. Dressing: Dry, sterile, compression dressing. Disposition: Patient tolerated procedure well. Patient to return in 1 week for follow-up.  No follow-ups on file.         No follow-ups on file.

## 2021-04-29 NOTE — Addendum Note (Signed)
Addended by: Ward Chatters on: 04/29/2021 09:46 AM   Modules accepted: Orders

## 2021-05-03 ENCOUNTER — Telehealth: Payer: Self-pay

## 2021-05-03 ENCOUNTER — Ambulatory Visit: Payer: Medicaid Other

## 2021-05-03 NOTE — Telephone Encounter (Addendum)
PT called and spoke with patient's daughter as patient was unavailable regarding no show. Reviewed attendance policy and confirmed next appt.   Ward Chatters, PT, DPT 05/03/21 9:46 AM

## 2021-05-05 ENCOUNTER — Other Ambulatory Visit: Payer: Self-pay

## 2021-05-05 ENCOUNTER — Ambulatory Visit: Payer: Medicaid Other | Admitting: Physical Therapy

## 2021-05-05 DIAGNOSIS — M6281 Muscle weakness (generalized): Secondary | ICD-10-CM

## 2021-05-05 DIAGNOSIS — R2689 Other abnormalities of gait and mobility: Secondary | ICD-10-CM

## 2021-05-05 DIAGNOSIS — M25572 Pain in left ankle and joints of left foot: Secondary | ICD-10-CM

## 2021-05-05 NOTE — Therapy (Signed)
[place], [place], [address] Phone: [phone]   Fax:  [phone]  Physical Therapy Treatment  Patient Details  Name: [name] MRN: [id number] Date of Birth: 1970/08/22 Referring Provider (PT): Felipa Furnace, [place]   Encounter Date: 05/05/2021   PT End of Session - 05/05/21 0853     Visit Number 2    Number of Visits 24    Date for PT Re-Evaluation 07/20/21    Authorization Type [employer] M[employer]    PT Start Time [id number]    PT Stop Time 0928    PT Time Calculation (min) 39 min             Past Medical History:  Diagnosis Date   Arthritis    Asthma    Cataract    Mixed form OU   CKD (chronic kidney disease)    Coronary artery disease    Diabetes mellitus    Diabetic retinopathy ([place])    NPDR OU   Hyperlipidemia    Hypertension    Hypertensive retinopathy    OU   Left thyroid nodule    diagnosed 07/2018   PAC (premature atrial contraction) 02/15/2021   Pseudotumor cerebri    Stroke [facility])    Vitamin D deficiency     Past Surgical History:  Procedure Laterality Date   ACHILLES TENDON REPAIR     ACHILLES TENDON SURGERY Left 02/19/2021   Procedure: ACHILLES TENDON REPAIR WITH GRAFT;  Surgeon: Felipa Furnace, DPM;  Location: [place];  Service: Podiatry;  Laterality: Left;  BLOCK   GASTROC RECESSION EXTREMITY Left 02/19/2021   Procedure: GASTROC RECESSION EXTREMITY;  Surgeon: Felipa Furnace, DPM;  Location: [place];  Service: Podiatry;  Laterality: Left;  Block   TUBAL LIGATION      There were no vitals filed for this visit.   Subjective Assessment - 05/05/21 0852     Subjective Pt reports tightness in lower calf, no pain on arrival.    Currently in Pain? No/denies               Therapeutic Exercises:  Seated toe scrunch x 20  Seated Inversion/ eversion towel slides x 20   Seated Heel Raise -  2 sets - 20 reps Seated Ankle Plantar  Flexion with Resistance Loop -  - 3 sets - 10 reps Seated Ankle Dorsi Flexion with Resistance Loop Red 2 sets 10 reps  Standing Tandem Balance with Counter Support - - 5 reps- 6 sec best  Standing Hip abduction at counter 10 reps 2 sets each  Seated DF stretch with Strap 30 sec x 3 left  Seated Baps Level 2 : A/P , lateral taps and circles x 10 each direction   PT Short Term Goals - 04/27/21 1422       PT SHORT TERM GOAL #1   Title Pt will be compliant and knowledgeable with initial HEP for improved carryover    Baseline initial HEP given    Time 3    Period Weeks    Status New    Target Date 05/18/21               PT Long Term Goals - 04/27/21 1423       PT LONG TERM GOAL #1   Title Pt will improve LEFS score to no less than 48/80 as proxy for functional improvement    Baseline 36/80    Time 12  Period Weeks    Status New    Target Date 07/20/21      PT LONG TERM GOAL #2   Title Pt wil decrease 5xSTS to no greater than 15 seconds for improved functional mobility and LE strength    Baseline 23 seconds w/ bilat UE support    Time 12    Period Weeks    Status New    Target Date 07/20/21      PT LONG TERM GOAL #3   Title Pt will hold L SLS to 20 sec for improved balance and safety    Baseline 5 sec in tandem L back    Time 12    Period Weeks    Status New    Target Date 07/20/21      PT LONG TERM GOAL #4   Title Pt will improve TUG to no greater than 10 seconds for improved balance and decreased fall risk    Baseline 19 seconds with LoB towards L    Time 12    Period Weeks    Status New    Target Date 07/20/21                   Plan - 05/05/21 0916     Clinical Impression Statement Pt reports compliance with HEP and no pain at start of session. She reports tightness located at lower calf. Reviewd HEP and progressed with seated ankle strength and standing ankle stability with UE support. Tandem stance 6 seconds at best today.    PT  Treatment/Interventions ADLs/Self Care Home Management;Electrical Stimulation;Cryotherapy;Gait training;Stair training;Functional mobility training;Therapeutic activities;Therapeutic exercise;Balance training;Neuromuscular re-education;Patient/family education;Manual techniques;Vasopneumatic Device;Taping    PT Next Visit Plan assess response to HEP; progress per protocol    PT Home Exercise Plan Access Code: [email or web address]    Consulted and Agree with Plan of Care Patient             Patient will benefit from skilled therapeutic intervention in order to improve the following deficits and impairments:  Abnormal gait, Decreased activity tolerance, Decreased balance, Decreased endurance, Decreased mobility, Decreased range of motion, Decreased strength, Difficulty walking, Pain  Visit Diagnosis: Pain in left ankle and joints of left foot  Muscle weakness (generalized)  Other abnormalities of gait and mobility     Problem List Patient Active Problem List   Diagnosis Date Noted   PAC (premature atrial contraction) 02/15/2021   Symptomatic anemia 06/25/2020   Chronic heart failure with preserved ejection fraction ([place]) 06/25/2020   Educated about COVID-19 virus infection 04/29/2020   Class 2 severe obesity with serious comorbidity and body mass index (BMI) of 36.0 to 36.9 in adult ([place]) 03/30/2020   NSVT (nonsustained ventricular tachycardia) ([place]) 11/13/2019   Pericardial effusion 09/17/2019   H/O: stroke 04/09/2019   Left ventricular hypertrophy 04/09/2019   Mixed hyperlipidemia 04/08/2019   Uncontrolled type 2 diabetes mellitus with hyperglycemia ([place]) 04/08/2019   Non compliance w medication regimen 02/20/2019   Diabetes mellitus type 2 in obese ([place])    Resistant hypertension    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[place]) 02/13/2019   Cerebrovascular accident (CVA) ([place])    Hypokalemia    Hypomagnesemia    AKI (acute kidney injury) ([place])    Acute CVA (cerebrovascular accident) ([place]) 02/11/2019    Hypertensive urgency 02/11/2019   ARF (acute renal failure) ([place]) 02/11/2019   Type 2 diabetes mellitus with vascular disease ([place]) 02/11/2019   Diabetes mellitus, type 2 ([place]) 04/12/2018   Essential hypertension 01/08/2018  Obstructive sleep apnea 08/26/2017    Dorene Ar, PTA 05/05/2021, 9:38 AM  [facility] [address] [place], [place], [address] Phone: [phone]   Fax:  [phone]  Name: [name] MRN: [id number] Date of Birth: 1970/09/05

## 2021-05-10 ENCOUNTER — Ambulatory Visit: Payer: Medicaid Other | Attending: Podiatry

## 2021-05-10 ENCOUNTER — Other Ambulatory Visit: Payer: Self-pay

## 2021-05-10 DIAGNOSIS — M6281 Muscle weakness (generalized): Secondary | ICD-10-CM | POA: Diagnosis present

## 2021-05-10 DIAGNOSIS — R2689 Other abnormalities of gait and mobility: Secondary | ICD-10-CM | POA: Insufficient documentation

## 2021-05-10 DIAGNOSIS — M25572 Pain in left ankle and joints of left foot: Secondary | ICD-10-CM | POA: Diagnosis not present

## 2021-05-10 NOTE — Therapy (Signed)
[place], [place], [address] Phone: [phone]   Fax:  [phone]  Physical Therapy Treatment  Patient Details  Name: [name] MRN: [id number] Date of Birth: 26-May-1971 Referring Provider (PT): Felipa Furnace, [place]   Encounter Date: 05/10/2021   PT End of Session - 05/10/21 0829     Visit Number 3    Number of Visits 24    Date for PT Re-Evaluation 07/20/21    Authorization Type [employer]    PT Start Time 0830    PT Stop Time 0909    PT Time Calculation (min) 39 min    Activity Tolerance Patient tolerated treatment well;No increased pain    Behavior During Therapy WFL for tasks assessed/performed             Past Medical History:  Diagnosis Date   Arthritis    Asthma    Cataract    Mixed form OU   CKD (chronic kidney disease)    Coronary artery disease    Diabetes mellitus    Diabetic retinopathy ([place])    NPDR OU   Hyperlipidemia    Hypertension    Hypertensive retinopathy    OU   Left thyroid nodule    diagnosed 07/2018   PAC (premature atrial contraction) 02/15/2021   Pseudotumor cerebri    Stroke ([place])    Vitamin D deficiency     Past Surgical History:  Procedure Laterality Date   ACHILLES TENDON REPAIR     ACHILLES TENDON SURGERY Left 02/19/2021   Procedure: ACHILLES TENDON REPAIR WITH GRAFT;  Surgeon: Felipa Furnace, DPM;  Location: [facility];  Service: Podiatry;  Laterality: Left;  BLOCK   GASTROC RECESSION EXTREMITY Left 02/19/2021   Procedure: GASTROC RECESSION EXTREMITY;  Surgeon: Felipa Furnace, DPM;  Location: [facility];  Service: Podiatry;  Laterality: Left;  Block   TUBAL LIGATION      There were no vitals filed for this visit.   Subjective Assessment - 05/10/21 0830     Subjective Pt presents to PT with continued reports of L calf tightness. Has been compliant with HEP with no adverse effect. Ready to begin PT  treatment at this time.    Currently in Pain? No/denies    Pain Score 0-No pain           [facility] Adult PT Treatment/Exercise:   Therapeutic Exercise:  NuStep lvl 5 UE/LE x 4 min while taking subjective Seated toe scrunch L x 60 sec Seated Inversion/ eversion towel slides x 20   Seated Heel Raise -  2 sets - 20 reps Seated Ankle Plantar Flexion with Red theraband - 3x10 reps Seated Ankle Dorsi Flexion with Resistance Loop Red 2 sets 10 reps  Standing Tandem Balance in // 2x30 sec ea Standing Hip abduction at counter 10 reps 2 sets each  Seated DF stretch with Strap 30 sec x 3 left  Seated Baps Level 2 : A/P , fwd/bwd/lateral taps and circles x 10 each direction                               PT Short Term Goals - 04/27/21 1422       PT SHORT TERM GOAL #1   Title Pt will be compliant and knowledgeable with initial HEP for improved carryover    Baseline initial HEP given    Time 3  Period Weeks    Status New    Target Date 05/18/21               PT Long Term Goals - 04/27/21 1423       PT LONG TERM GOAL #1   Title Pt will improve LEFS score to no less than 48/80 as proxy for functional improvement    Baseline 36/80    Time 12    Period Weeks    Status New    Target Date 07/20/21      PT LONG TERM GOAL #2   Title Pt wil decrease 5xSTS to no greater than 15 seconds for improved functional mobility and LE strength    Baseline 23 seconds w/ bilat UE support    Time 12    Period Weeks    Status New    Target Date 07/20/21      PT LONG TERM GOAL #3   Title Pt will hold L SLS to 20 sec for improved balance and safety    Baseline 5 sec in tandem L back    Time 12    Period Weeks    Status New    Target Date 07/20/21      PT LONG TERM GOAL #4   Title Pt will improve TUG to no greater than 10 seconds for improved balance and decreased fall risk    Baseline 19 seconds with LoB towards L    Time 12    Period Weeks    Status New     Target Date 07/20/21                   Plan - 05/10/21 0844     Clinical Impression Statement Pt was again able to complete prescribed exercises with no adverse effect or change in baseline. Pt unable to perform standing heel raise d/t calf weakness bilaterally. Continues to note tightness in L lateral calf. PT overall is progressing well with therapy, but continues to require skilled PT d/t deficits in strength and balance post achilles repair. Will continue to progress as able per POC.    PT Treatment/Interventions ADLs/Self Care Home Management;Electrical Stimulation;Cryotherapy;Gait training;Stair training;Functional mobility training;Therapeutic activities;Therapeutic exercise;Balance training;Neuromuscular re-education;Patient/family education;Manual techniques;Vasopneumatic Device;Taping    PT Next Visit Plan assess response to HEP; progress per protocol    PT Home Exercise Plan Access Code: [email or web address]             Patient will benefit from skilled therapeutic intervention in order to improve the following deficits and impairments:  Abnormal gait, Decreased activity tolerance, Decreased balance, Decreased endurance, Decreased mobility, Decreased range of motion, Decreased strength, Difficulty walking, Pain  Visit Diagnosis: Pain in left ankle and joints of left foot  Muscle weakness (generalized)  Other abnormalities of gait and mobility     Problem List Patient Active Problem List   Diagnosis Date Noted   PAC (premature atrial contraction) 02/15/2021   Symptomatic anemia 06/25/2020   Chronic heart failure with preserved ejection fraction ([place]) 06/25/2020   Educated about COVID-19 virus infection 04/29/2020   Class 2 severe obesity with serious comorbidity and body mass index (BMI) of 36.0 to 36.9 in adult ([place]) 03/30/2020   NSVT (nonsustained ventricular tachycardia) 11/13/2019   Pericardial effusion 09/17/2019   H/O: stroke 04/09/2019   Left ventricular  hypertrophy 04/09/2019   Mixed hyperlipidemia 04/08/2019   Uncontrolled type 2 diabetes mellitus with hyperglycemia ([place]) 04/08/2019   Non compliance w medication regimen 02/20/2019   Diabetes mellitus  type 2 in obese ([facility])    Resistant hypertension    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[place]) 02/13/2019   Cerebrovascular accident (CVA) ([place])    Hypokalemia    Hypomagnesemia    AKI (acute kidney injury) ([place])    Acute CVA (cerebrovascular accident) ([place]) 02/11/2019   Hypertensive urgency 02/11/2019   ARF (acute renal failure) ([place]) 02/11/2019   Type 2 diabetes mellitus with vascular disease ([place]) 02/11/2019   Diabetes mellitus, type 2 ([place]) 04/12/2018   Essential hypertension 01/08/2018   Obstructive sleep apnea 08/26/2017    Ward Chatters, PT 05/10/2021, 9:10 AM  [facility] [address] [place], [place], [address] Phone: [phone]   Fax:  [phone]  Name: [name] MRN: [id number] Date of Birth: 1971/07/16

## 2021-05-12 ENCOUNTER — Ambulatory Visit: Payer: Medicaid Other | Admitting: Physical Therapy

## 2021-05-12 ENCOUNTER — Encounter: Payer: Self-pay | Admitting: Physical Therapy

## 2021-05-12 ENCOUNTER — Other Ambulatory Visit: Payer: Self-pay

## 2021-05-12 DIAGNOSIS — R2689 Other abnormalities of gait and mobility: Secondary | ICD-10-CM

## 2021-05-12 DIAGNOSIS — M25572 Pain in left ankle and joints of left foot: Secondary | ICD-10-CM

## 2021-05-12 DIAGNOSIS — M6281 Muscle weakness (generalized): Secondary | ICD-10-CM

## 2021-05-12 NOTE — Therapy (Signed)
[place], [place], [address] Phone: [phone]   Fax:  [phone]  Physical Therapy Treatment  Patient Details  Name: [name] MRN: [id number] Date of Birth: 1970/10/07 Referring Provider (PT): Felipa Furnace, [place]   Encounter Date: 05/12/2021   PT End of Session - 05/12/21 1223     Visit Number 4    Number of Visits 24    Date for PT Re-Evaluation 07/20/21    Authorization Type [employer]    PT Start Time 0845    PT Stop Time 0930    PT Time Calculation (min) 45 min             Past Medical History:  Diagnosis Date   Arthritis    Asthma    Cataract    Mixed form OU   CKD (chronic kidney disease)    Coronary artery disease    Diabetes mellitus    Diabetic retinopathy ([place])    NPDR OU   Hyperlipidemia    Hypertension    Hypertensive retinopathy    OU   Left thyroid nodule    diagnosed 07/2018   PAC (premature atrial contraction) 02/15/2021   Pseudotumor cerebri    Stroke ([place])    Vitamin D deficiency     Past Surgical History:  Procedure Laterality Date   ACHILLES TENDON REPAIR     ACHILLES TENDON SURGERY Left 02/19/2021   Procedure: ACHILLES TENDON REPAIR WITH GRAFT;  Surgeon: Felipa Furnace, DPM;  Location: [facility];  Service: Podiatry;  Laterality: Left;  BLOCK   GASTROC RECESSION EXTREMITY Left 02/19/2021   Procedure: GASTROC RECESSION EXTREMITY;  Surgeon: Felipa Furnace, DPM;  Location: [facility];  Service: Podiatry;  Laterality: Left;  Block   TUBAL LIGATION      There were no vitals filed for this visit.     [facility] Adult PT Treatment/Exercise:   Therapeutic Exercise:  NuStep lvl 5 LE only  x 7 min while taking subjective Seated toe scrunch L x 60 sec Seated Inversion/ eversion towel slides x 20   Seated Heel Raise -  2 sets - 20 reps Seated toe raise -2 sets - 20 reps Seated Ankle Plantar Flexion with Green theraband -  3x10 reps (cues for eccentric control)  Seated Ankle Dorsi Flexion with Resistance Loop Green 2 sets 10 reps ( cues for eccentric control) Standing Tandem Balance at counter 2x30 sec ea Standing Hip abduction at counter 10 reps 2 sets each  Seated DF stretch with Strap 30 sec x 3 left  Seated Baps Level 2 : A/P ,circles x 15 each direction Marble pickup- unable to pick up marbles with toes            PT Short Term Goals - 04/27/21 1422       PT SHORT TERM GOAL #1   Title Pt will be compliant and knowledgeable with initial HEP for improved carryover    Baseline initial HEP given    Time 3    Period Weeks    Status New    Target Date 05/18/21               PT Long Term Goals - 04/27/21 1423       PT LONG TERM GOAL #1   Title Pt will improve LEFS score to no less than 48/80 as proxy for functional improvement    Baseline 36/80    Time 12  Period Weeks    Status New    Target Date 07/20/21      PT LONG TERM GOAL #2   Title Pt wil decrease 5xSTS to no greater than 15 seconds for improved functional mobility and LE strength    Baseline 23 seconds w/ bilat UE support    Time 12    Period Weeks    Status New    Target Date 07/20/21      PT LONG TERM GOAL #3   Title Pt will hold L SLS to 20 sec for improved balance and safety    Baseline 5 sec in tandem L back    Time 12    Period Weeks    Status New    Target Date 07/20/21      PT LONG TERM GOAL #4   Title Pt will improve TUG to no greater than 10 seconds for improved balance and decreased fall risk    Baseline 19 seconds with LoB towards L    Time 12    Period Weeks    Status New    Target Date 07/20/21                   Plan - 05/12/21 [id number]     Clinical Impression Statement Pt reports she was sore in her hips after last session. She arrives without pain. Continued with standing balance and seated ankle strength. She is unable to pick up marbles with her toes. She required cues to use  eccentric control with seated ankle PF and DF strengthening. Palpable thickness present in distal calf at end of session. She reported no increased pain at end of session.    PT Treatment/Interventions ADLs/Self Care Home Management;Electrical Stimulation;Cryotherapy;Gait training;Stair training;Functional mobility training;Therapeutic activities;Therapeutic exercise;Balance training;Neuromuscular re-education;Patient/family education;Manual techniques;Vasopneumatic Device;Taping    PT Next Visit Plan assess response to HEP; progress per protocol, STW to calf    PT Home Exercise Plan Access Code: [email or web address]             Patient will benefit from skilled therapeutic intervention in order to improve the following deficits and impairments:  Abnormal gait, Decreased activity tolerance, Decreased balance, Decreased endurance, Decreased mobility, Decreased range of motion, Decreased strength, Difficulty walking, Pain  Visit Diagnosis: Pain in left ankle and joints of left foot  Muscle weakness (generalized)  Other abnormalities of gait and mobility     Problem List Patient Active Problem List   Diagnosis Date Noted   PAC (premature atrial contraction) 02/15/2021   Symptomatic anemia 06/25/2020   Chronic heart failure with preserved ejection fraction ([place]) 06/25/2020   Educated about COVID-19 virus infection 04/29/2020   Class 2 severe obesity with serious comorbidity and body mass index (BMI) of 36.0 to 36.9 in adult ([place]) 03/30/2020   NSVT (nonsustained ventricular tachycardia) 11/13/2019   Pericardial effusion 09/17/2019   H/O: stroke 04/09/2019   Left ventricular hypertrophy 04/09/2019   Mixed hyperlipidemia 04/08/2019   Uncontrolled type 2 diabetes mellitus with hyperglycemia ([place]) 04/08/2019   Non compliance w medication regimen 02/20/2019   Diabetes mellitus type 2 in obese ([place])    Resistant hypertension    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[place]) 02/13/2019   Cerebrovascular accident (CVA)  ([place])    Hypokalemia    Hypomagnesemia    AKI (acute kidney injury) ([place])    Acute CVA (cerebrovascular accident) ([place]) 02/11/2019   Hypertensive urgency 02/11/2019   ARF (acute renal failure) ([place]) 02/11/2019   Type 2 diabetes mellitus with vascular disease ([place])  02/11/2019   Diabetes mellitus, type 2 ([place]) 04/12/2018   Essential hypertension 01/08/2018   Obstructive sleep apnea 08/26/2017    Dorene Ar, PTA 05/12/2021, 12:23 PM  [facility] [address] [place], [place], [address] Phone: [phone]   Fax:  [phone]  Name: ATHALIA TUMULTY MRN: [id number] Date of Birth: 1971-05-09

## 2021-05-19 ENCOUNTER — Other Ambulatory Visit: Payer: Self-pay

## 2021-05-19 ENCOUNTER — Encounter: Payer: Self-pay | Admitting: Physical Therapy

## 2021-05-19 ENCOUNTER — Ambulatory Visit: Payer: Medicaid Other | Admitting: Physical Therapy

## 2021-05-19 DIAGNOSIS — M6281 Muscle weakness (generalized): Secondary | ICD-10-CM

## 2021-05-19 DIAGNOSIS — R2689 Other abnormalities of gait and mobility: Secondary | ICD-10-CM

## 2021-05-19 DIAGNOSIS — M25572 Pain in left ankle and joints of left foot: Secondary | ICD-10-CM | POA: Diagnosis not present

## 2021-05-19 NOTE — Therapy (Signed)
[place], [place], [address] Phone: [phone]   Fax:  [phone]  Physical Therapy Treatment  Patient Details  Name: [name] MRN: [id number] Date of Birth: [birth year] Referring Provider (PT): Felipa Furnace, [place]   Encounter Date: 05/19/2021   PT End of Session - 05/19/21 0851     Visit Number 5    Number of Visits 24    Date for PT Re-Evaluation 07/20/21    Authorization Type [employer] 05/03/21-07/23/21- 24 visits    PT Start Time 0846    PT Stop Time 0930    PT Time Calculation (min) 44 min             Past Medical History:  Diagnosis Date   Arthritis    Asthma    Cataract    Mixed form OU   CKD (chronic kidney disease)    Coronary artery disease    Diabetes mellitus    Diabetic retinopathy ([place])    NPDR OU   Hyperlipidemia    Hypertension    Hypertensive retinopathy    OU   Left thyroid nodule    diagnosed 07/2018   PAC (premature atrial contraction) 02/15/2021   Pseudotumor cerebri    Stroke [facility])    Vitamin D deficiency     Past Surgical History:  Procedure Laterality Date   ACHILLES TENDON REPAIR     ACHILLES TENDON SURGERY Left 02/19/2021   Procedure: ACHILLES TENDON REPAIR WITH GRAFT;  Surgeon: Felipa Furnace, DPM;  Location: [facility];  Service: Podiatry;  Laterality: Left;  BLOCK   GASTROC RECESSION EXTREMITY Left 02/19/2021   Procedure: GASTROC RECESSION EXTREMITY;  Surgeon: Felipa Furnace, DPM;  Location: [facility];  Service: Podiatry;  Laterality: Left;  Block   TUBAL LIGATION      There were no vitals filed for this visit.   Subjective Assessment - 05/19/21 0851     Subjective No pain just some tightness in the calf.    Currently in Pain? No/denies            [facility] Adult PT Treatment/Exercise:   Therapeutic Exercise:  NuStep lvl 5 LE only  x 7 min while taking subjective Seated toe scrunch L x 60  sec Seated Inversion/ eversion towel slides x 20   Seated Heel Raise -  2 sets - 20 reps Seated toe raise -2 sets - 20 reps Seated Ankle Plantar Flexion with Green theraband - 3x10 reps (cues for eccentric control)  Seated Ankle Dorsi Flexion with Resistance Loop Green 2 sets 10 reps ( cues for eccentric control) Standing Tandem Balance at counter 2x30 sec ea (not today)  Standing Hip abduction at counter 10 reps 2 sets each (not today) Seated DF stretch with Strap 30 sec x 3 left (not today) Seated Baps Level 2 : A/P ,circles x 15 each direction; with added weight - 1plate each at AM, PL  Manual Therapy:  Prone soft tissue work to left gastroc, passive DF    PT Short Term Goals - 04/27/21 1422       PT SHORT TERM GOAL #1   Title Pt will be compliant and knowledgeable with initial HEP for improved carryover    Baseline initial HEP given    Time 3    Period Weeks    Status New    Target Date 05/18/21  PT Long Term Goals - 04/27/21 1423       PT LONG TERM GOAL #1   Title Pt will improve LEFS score to no less than 48/80 as proxy for functional improvement    Baseline 36/80    Time 12    Period Weeks    Status New    Target Date 07/20/21      PT LONG TERM GOAL #2   Title Pt wil decrease 5xSTS to no greater than 15 seconds for improved functional mobility and LE strength    Baseline 23 seconds w/ bilat UE support    Time 12    Period Weeks    Status New    Target Date 07/20/21      PT LONG TERM GOAL #3   Title Pt will hold L SLS to 20 sec for improved balance and safety    Baseline 5 sec in tandem L back    Time 12    Period Weeks    Status New    Target Date 07/20/21      PT LONG TERM GOAL #4   Title Pt will improve TUG to no greater than 10 seconds for improved balance and decreased fall risk    Baseline 19 seconds with LoB towards L    Time 12    Period Weeks    Status New    Target Date 07/20/21                   Plan -  05/19/21 0913     Clinical Impression Statement Pt arrives with tightness reported in calf. Contineud with seated ankle strength with added weight to [employer]. Pt tolerated session well. Began soft tissue work to decrease tension in calf. Did not perform standing exercises today due to patient wearing a built up shoe on RLE.    PT Next Visit Plan assess response to HEP; progress per protocol, STW to calf    PT Home Exercise Plan Access Code: [email or web address]             Patient will benefit from skilled therapeutic intervention in order to improve the following deficits and impairments:  Abnormal gait, Decreased activity tolerance, Decreased balance, Decreased endurance, Decreased mobility, Decreased range of motion, Decreased strength, Difficulty walking, Pain  Visit Diagnosis: Pain in left ankle and joints of left foot  Muscle weakness (generalized)  Other abnormalities of gait and mobility     Problem List Patient Active Problem List   Diagnosis Date Noted   PAC (premature atrial contraction) 02/15/2021   Symptomatic anemia 06/25/2020   Chronic heart failure with preserved ejection fraction ([place]) 06/25/2020   Educated about COVID-19 virus infection 04/29/2020   Class 2 severe obesity with serious comorbidity and body mass index (BMI) of 36.0 to 36.9 in adult ([place]) 03/30/2020   NSVT (nonsustained ventricular tachycardia) 11/13/2019   Pericardial effusion 09/17/2019   H/O: stroke 04/09/2019   Left ventricular hypertrophy 04/09/2019   Mixed hyperlipidemia 04/08/2019   Uncontrolled type 2 diabetes mellitus with hyperglycemia ([place]) 04/08/2019   Non compliance w medication regimen 02/20/2019   Diabetes mellitus type 2 in obese ([place])    Resistant hypertension    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[place]) 02/13/2019   Cerebrovascular accident (CVA) ([place])    Hypokalemia    Hypomagnesemia    AKI (acute kidney injury) ([place])    Acute CVA (cerebrovascular accident) ([place]) 02/11/2019   Hypertensive  urgency 02/11/2019   ARF (acute renal failure) ([place]) 02/11/2019  Type 2 diabetes mellitus with vascular disease ([place]) 02/11/2019   Diabetes mellitus, type 2 ([place]) 04/12/2018   Essential hypertension 01/08/2018   Obstructive sleep apnea 08/26/2017    Dorene Ar, PTA 05/19/2021, 12:33 PM  [facility] [address] [place], [place], [address] Phone: [phone]   Fax:  [phone]  Name: [name] MRN: [id number] Date of Birth: 07-02-1971

## 2021-05-21 ENCOUNTER — Ambulatory Visit: Payer: Medicaid Other | Admitting: Physical Therapy

## 2021-05-23 ENCOUNTER — Other Ambulatory Visit: Payer: Medicaid Other

## 2021-05-24 ENCOUNTER — Other Ambulatory Visit: Payer: Self-pay

## 2021-05-24 ENCOUNTER — Ambulatory Visit: Payer: Medicaid Other

## 2021-05-24 DIAGNOSIS — M25572 Pain in left ankle and joints of left foot: Secondary | ICD-10-CM

## 2021-05-24 DIAGNOSIS — R2689 Other abnormalities of gait and mobility: Secondary | ICD-10-CM

## 2021-05-24 DIAGNOSIS — M6281 Muscle weakness (generalized): Secondary | ICD-10-CM

## 2021-05-24 NOTE — Therapy (Signed)
[place], [place], [address] Phone: [phone]   Fax:  [phone]  Physical Therapy Treatment  Patient Details  Name: [name] MRN: [id number] Date of Birth: 1971-04-15 Referring Provider (PT): Felipa Furnace, [place]   Encounter Date: 05/24/2021   PT End of Session - 05/24/21 0841     Visit Number 6    Number of Visits 24    Date for PT Re-Evaluation 07/20/21    Authorization Type [employer] 05/03/21-07/23/21- 24 visits    PT Start Time [id number]    PT Stop Time [id number]    PT Time Calculation (min) 36 min             Past Medical History:  Diagnosis Date   Arthritis    Asthma    Cataract    Mixed form OU   CKD (chronic kidney disease)    Coronary artery disease    Diabetes mellitus    Diabetic retinopathy ([place])    NPDR OU   Hyperlipidemia    Hypertension    Hypertensive retinopathy    OU   Left thyroid nodule    diagnosed 07/2018   PAC (premature atrial contraction) 02/15/2021   Pseudotumor cerebri    Stroke [facility])    Vitamin D deficiency     Past Surgical History:  Procedure Laterality Date   ACHILLES TENDON REPAIR     ACHILLES TENDON SURGERY Left 02/19/2021   Procedure: ACHILLES TENDON REPAIR WITH GRAFT;  Surgeon: Felipa Furnace, DPM;  Location: [place];  Service: Podiatry;  Laterality: Left;  BLOCK   GASTROC RECESSION EXTREMITY Left 02/19/2021   Procedure: GASTROC RECESSION EXTREMITY;  Surgeon: Felipa Furnace, DPM;  Location: [place];  Service: Podiatry;  Laterality: Left;  Block   TUBAL LIGATION      There were no vitals filed for this visit.   Subjective Assessment - 05/24/21 0841     Subjective Pt presents to PT with reports of swelling and pain in L ankle. Has been compliant with her HEP with no adverse effect. Ready to begin PT treatment at this time.    Currently in Pain? No/denies    Pain Score 0-No pain           [facility]  Adult PT Treatment/Exercise:   Therapeutic Exercise:  NuStep lvl 5 LE only  x 4 min while taking subjective Step up x 10 3a 6in step Standing Tandem Balance at counter 2x30 sec ea Seated Ankle Plantar Flexion with blue theraband - 3x15 reps (cues for eccentric control)  Seated Heel Toe Raise -  2 sets - 20 reps Seated DF stretch with Strap 30 sec x 3 L Seated Inversion/ eversion towel slides x 5 ea   Past Interventions Not Performed Today: Seated Ankle Dorsi Flexion with Resistance Loop Green 2 sets 10 reps ( cues for eccentric control) Standing Hip abduction at counter 10 reps 2 sets each (not today) Seated Baps Level 2 : A/P ,circles x 15 each direction; with added weight - 1plate each at AM, PL Seated Toe scrunch L x 60 sec  Manual Therapy(NOT TODAY):  Prone soft tissue work to left gastroc, passive DF                                PT Short Term Goals - 04/27/21 1422       PT SHORT  TERM GOAL #1   Title Pt will be compliant and knowledgeable with initial HEP for improved carryover    Baseline initial HEP given    Time 3    Period Weeks    Status New    Target Date 05/18/21               PT Long Term Goals - 04/27/21 1423       PT LONG TERM GOAL #1   Title Pt will improve LEFS score to no less than 48/80 as proxy for functional improvement    Baseline 36/80    Time 12    Period Weeks    Status New    Target Date 07/20/21      PT LONG TERM GOAL #2   Title Pt wil decrease 5xSTS to no greater than 15 seconds for improved functional mobility and LE strength    Baseline 23 seconds w/ bilat UE support    Time 12    Period Weeks    Status New    Target Date 07/20/21      PT LONG TERM GOAL #3   Title Pt will hold L SLS to 20 sec for improved balance and safety    Baseline 5 sec in tandem L back    Time 12    Period Weeks    Status New    Target Date 07/20/21      PT LONG TERM GOAL #4   Title Pt will improve TUG to no greater than  10 seconds for improved balance and decreased fall risk    Baseline 19 seconds with LoB towards L    Time 12    Period Weeks    Status New    Target Date 07/20/21                   Plan - 05/24/21 0843     Clinical Impression Statement Pt was able to complete prescribed exercises with no change in baseline pain. Continues to have decreased neuromuscular control in distal L LE, with movement in to inversion during resisted PF. She also could not yet perform standing heel raise d/t calf tightness and muscle weakness. Today we continued to focus on general strength and balance of distal LE musculature. Pt continues to benefit from skilled PT services and will continue to be progressed as tolerated.    PT Treatment/Interventions ADLs/Self Care Home Management;Electrical Stimulation;Cryotherapy;Gait training;Stair training;Functional mobility training;Therapeutic activities;Therapeutic exercise;Balance training;Neuromuscular re-education;Patient/family education;Manual techniques;Vasopneumatic Device;Taping    PT Next Visit Plan assess response to HEP; progress per protocol, STW to calf    PT Home Exercise Plan Access Code: [email or web address]             Patient will benefit from skilled therapeutic intervention in order to improve the following deficits and impairments:  Abnormal gait, Decreased activity tolerance, Decreased balance, Decreased endurance, Decreased mobility, Decreased range of motion, Decreased strength, Difficulty walking, Pain  Visit Diagnosis: Pain in left ankle and joints of left foot  Muscle weakness (generalized)  Other abnormalities of gait and mobility     Problem List Patient Active Problem List   Diagnosis Date Noted   PAC (premature atrial contraction) 02/15/2021   Symptomatic anemia 06/25/2020   Chronic heart failure with preserved ejection fraction ([place]) 06/25/2020   Educated about COVID-19 virus infection 04/29/2020   Class 2 severe obesity with  serious comorbidity and body mass index (BMI) of 36.0 to 36.9 in adult [facility]) 03/30/2020   NSVT (nonsustained  ventricular tachycardia) 11/13/2019   Pericardial effusion 09/17/2019   H/O: stroke 04/09/2019   Left ventricular hypertrophy 04/09/2019   Mixed hyperlipidemia 04/08/2019   Uncontrolled type 2 diabetes mellitus with hyperglycemia ([place]) 04/08/2019   Non compliance w medication regimen 02/20/2019   Diabetes mellitus type 2 in obese ([place])    Resistant hypertension    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[place]) 02/13/2019   Cerebrovascular accident (CVA) ([place])    Hypokalemia    Hypomagnesemia    AKI (acute kidney injury) ([place])    Acute CVA (cerebrovascular accident) ([place]) 02/11/2019   Hypertensive urgency 02/11/2019   ARF (acute renal failure) ([place]) 02/11/2019   Type 2 diabetes mellitus with vascular disease ([place]) 02/11/2019   Diabetes mellitus, type 2 ([place]) 04/12/2018   Essential hypertension 01/08/2018   Obstructive sleep apnea 08/26/2017    Ward Chatters, PT 05/24/2021, 9:14 AM  [facility] [address] [place], [place], [address] Phone: [phone]   Fax:  [phone]  Name: [name] MRN: [id number] Date of Birth: February 11, 1971

## 2021-05-26 ENCOUNTER — Encounter: Payer: Self-pay | Admitting: Physical Therapy

## 2021-05-26 ENCOUNTER — Other Ambulatory Visit: Payer: Self-pay

## 2021-05-26 ENCOUNTER — Ambulatory Visit: Payer: Medicaid Other | Admitting: Podiatry

## 2021-05-26 ENCOUNTER — Ambulatory Visit: Payer: Medicaid Other | Admitting: Physical Therapy

## 2021-05-26 DIAGNOSIS — L97512 Non-pressure chronic ulcer of other part of right foot with fat layer exposed: Secondary | ICD-10-CM | POA: Diagnosis not present

## 2021-05-26 DIAGNOSIS — Z751 Person awaiting admission to adequate facility elsewhere: Secondary | ICD-10-CM

## 2021-05-26 DIAGNOSIS — M25572 Pain in left ankle and joints of left foot: Secondary | ICD-10-CM

## 2021-05-26 DIAGNOSIS — M86471 Chronic osteomyelitis with draining sinus, right ankle and foot: Secondary | ICD-10-CM | POA: Diagnosis not present

## 2021-05-26 DIAGNOSIS — M6281 Muscle weakness (generalized): Secondary | ICD-10-CM

## 2021-05-26 NOTE — Therapy (Signed)
[place], [place], [address] Phone: [phone]   Fax:  [phone]  Physical Therapy Treatment  Patient Details  Name: [name] MRN: [id number] Date of Birth: Jul 08, 1971 Referring Provider (PT): Felipa Furnace, [place]   Encounter Date: 05/26/2021   PT End of Session - 05/26/21 0856     Visit Number 7    Number of Visits 24    Date for PT Re-Evaluation 07/20/21    Authorization Type [employer] 05/03/21-07/23/21- 24 visits    PT Start Time 0850    PT Stop Time 0930    PT Time Calculation (min) 40 min             Past Medical History:  Diagnosis Date   Arthritis    Asthma    Cataract    Mixed form OU   CKD (chronic kidney disease)    Coronary artery disease    Diabetes mellitus    Diabetic retinopathy ([place])    NPDR OU   Hyperlipidemia    Hypertension    Hypertensive retinopathy    OU   Left thyroid nodule    diagnosed 07/2018   PAC (premature atrial contraction) 02/15/2021   Pseudotumor cerebri    Stroke [facility])    Vitamin D deficiency     Past Surgical History:  Procedure Laterality Date   ACHILLES TENDON REPAIR     ACHILLES TENDON SURGERY Left 02/19/2021   Procedure: ACHILLES TENDON REPAIR WITH GRAFT;  Surgeon: Felipa Furnace, DPM;  Location: [place];  Service: Podiatry;  Laterality: Left;  BLOCK   GASTROC RECESSION EXTREMITY Left 02/19/2021   Procedure: GASTROC RECESSION EXTREMITY;  Surgeon: Felipa Furnace, DPM;  Location: [place];  Service: Podiatry;  Laterality: Left;  Block   TUBAL LIGATION      There were no vitals filed for this visit.   Subjective Assessment - 05/26/21 0853     Subjective I stopped using the cane 3 days ago and it has been going well.    Currently in Pain? Yes    Pain Score 4     Pain Location Ankle    Pain Orientation Left;Posterior    Pain Descriptors / Indicators Tightness;Discomfort    Pain Type  Surgical pain    Aggravating Factors  prolonged standing and walking    Pain Relieving Factors rest             [facility] Adult PT Treatment/Exercise:   Therapeutic Exercise:  NuStep lvl 5 LE only  x 6 min while taking subjective Left Step up x 15  6in step Standing Tandem Balance at counter 2x30 sec ea Leg press 20# horizontal Claf raise 10 x 2  Blue rocker board A/P rocking with min UE touch at free motion bar  Seated Baps Level 2 : A/P ,circles x 15 each direction; Seated Ankle Plantar Flexion with blue theraband - 3x15 reps (cues for eccentric control)  Seated Heel Toe Raise -  2 sets - 10 reps- added 6# dumbbell to knee  Seated DF stretch with Strap 30 sec x 3 L Seated Inversion/ eversion towel slides x 5 ea    Past Interventions Not Performed Today: Seated Ankle Dorsi Flexion with Resistance Loop Green 2 sets 10 reps ( cues for eccentric control) Standing Hip abduction at counter 10 reps 2 sets each   with added weight - 1plate each at AM, PL Seated Toe scrunch L x 60  sec   Manual Therapy(NOT TODAY):  Prone soft tissue work to left gastroc, passive DF           PT Short Term Goals - 05/26/21 0927       PT SHORT TERM GOAL #1   Title Pt will be compliant and knowledgeable with initial HEP for improved carryover    Baseline initial HEP given; 05/26/21: compliant and independent    Time 3    Period Weeks    Status Achieved    Target Date 05/18/21               PT Long Term Goals - 04/27/21 1423       PT LONG TERM GOAL #1   Title Pt will improve LEFS score to no less than 48/80 as proxy for functional improvement    Baseline 36/80    Time 12    Period Weeks    Status New    Target Date 07/20/21      PT LONG TERM GOAL #2   Title Pt wil decrease 5xSTS to no greater than 15 seconds for improved functional mobility and LE strength    Baseline 23 seconds w/ bilat UE support    Time 12    Period Weeks    Status New    Target Date 07/20/21      PT LONG  TERM GOAL #3   Title Pt will hold L SLS to 20 sec for improved balance and safety    Baseline 5 sec in tandem L back    Time 12    Period Weeks    Status New    Target Date 07/20/21      PT LONG TERM GOAL #4   Title Pt will improve TUG to no greater than 10 seconds for improved balance and decreased fall risk    Baseline 19 seconds with LoB towards L    Time 12    Period Weeks    Status New    Target Date 07/20/21                   Plan - 05/26/21 0934     Clinical Impression Statement [name] reports moderate pain on arrival described as tightness in her heel and feeling like someting is pushing through her medial ankle. She has discontinued her use of SPC. Continued with LE strength and balance challenges in open and closed chain. Used horizontal calf press and she could lift to neutral ankle DF. Also added resistance to seated DF strenghening. She is still unable to lift heels in standing. She is compliant and independent with her HEP. STG# 1 met.    PT Treatment/Interventions ADLs/Self Care Home Management;Electrical Stimulation;Cryotherapy;Gait training;Stair training;Functional mobility training;Therapeutic activities;Therapeutic exercise;Balance training;Neuromuscular re-education;Patient/family education;Manual techniques;Vasopneumatic Device;Taping    PT Next Visit Plan assess response to HEP; progress per protocol, STW to calf    PT Home Exercise Plan Access Code: [email or web address]             Patient will benefit from skilled therapeutic intervention in order to improve the following deficits and impairments:  Abnormal gait, Decreased activity tolerance, Decreased balance, Decreased endurance, Decreased mobility, Decreased range of motion, Decreased strength, Difficulty walking, Pain  Visit Diagnosis: Muscle weakness (generalized)  Pain in left ankle and joints of left foot     Problem List Patient Active Problem List   Diagnosis Date Noted   PAC (premature  atrial contraction) 02/15/2021   Symptomatic anemia 06/25/2020   Chronic  heart failure with preserved ejection fraction ([place]) 06/25/2020   Educated about COVID-19 virus infection 04/29/2020   Class 2 severe obesity with serious comorbidity and body mass index (BMI) of 36.0 to 36.9 in adult ([place]) 03/30/2020   NSVT (nonsustained ventricular tachycardia) 11/13/2019   Pericardial effusion 09/17/2019   H/O: stroke 04/09/2019   Left ventricular hypertrophy 04/09/2019   Mixed hyperlipidemia 04/08/2019   Uncontrolled type 2 diabetes mellitus with hyperglycemia ([place]) 04/08/2019   Non compliance w medication regimen 02/20/2019   Diabetes mellitus type 2 in obese ([place])    Resistant hypertension    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[place]) 02/13/2019   Cerebrovascular accident (CVA) ([place])    Hypokalemia    Hypomagnesemia    AKI (acute kidney injury) ([place])    Acute CVA (cerebrovascular accident) ([place]) 02/11/2019   Hypertensive urgency 02/11/2019   ARF (acute renal failure) ([place]) 02/11/2019   Type 2 diabetes mellitus with vascular disease ([place]) 02/11/2019   Diabetes mellitus, type 2 ([place]) 04/12/2018   Essential hypertension 01/08/2018   Obstructive sleep apnea 08/26/2017    Dorene Ar, PTA 05/26/2021, 9:38 AM  [facility] [address] [place], [place], [address] Phone: [phone]   Fax:  [phone]  Name: [name] MRN: [id number] Date of Birth: [birth year]

## 2021-05-27 ENCOUNTER — Encounter (HOSPITAL_COMMUNITY): Payer: Self-pay | Admitting: Emergency Medicine

## 2021-05-27 ENCOUNTER — Inpatient Hospital Stay (HOSPITAL_COMMUNITY)
Admission: EM | Admit: 2021-05-27 | Discharge: 2021-05-30 | DRG: 617 | Disposition: A | Discharge status: Home / Self Care | Payer: Medicaid Other | Attending: Family Medicine | Admitting: Family Medicine

## 2021-05-27 ENCOUNTER — Observation Stay (HOSPITAL_COMMUNITY): Payer: Medicaid Other

## 2021-05-27 ENCOUNTER — Emergency Department (HOSPITAL_COMMUNITY): Payer: Medicaid Other

## 2021-05-27 ENCOUNTER — Other Ambulatory Visit: Payer: Self-pay

## 2021-05-27 ENCOUNTER — Telehealth: Payer: Self-pay | Admitting: Podiatry

## 2021-05-27 ENCOUNTER — Ambulatory Visit: Payer: Self-pay | Admitting: Podiatry

## 2021-05-27 DIAGNOSIS — Z8673 Personal history of transient ischemic attack (TIA), and cerebral infarction without residual deficits: Secondary | ICD-10-CM

## 2021-05-27 DIAGNOSIS — E1165 Type 2 diabetes mellitus with hyperglycemia: Secondary | ICD-10-CM | POA: Diagnosis present

## 2021-05-27 DIAGNOSIS — L97116 Non-pressure chronic ulcer of right thigh with bone involvement without evidence of necrosis: Secondary | ICD-10-CM | POA: Diagnosis present

## 2021-05-27 DIAGNOSIS — L97509 Non-pressure chronic ulcer of other part of unspecified foot with unspecified severity: Secondary | ICD-10-CM | POA: Diagnosis present

## 2021-05-27 DIAGNOSIS — Z825 Family history of asthma and other chronic lower respiratory diseases: Secondary | ICD-10-CM

## 2021-05-27 DIAGNOSIS — E113293 Type 2 diabetes mellitus with mild nonproliferative diabetic retinopathy without macular edema, bilateral: Secondary | ICD-10-CM | POA: Diagnosis present

## 2021-05-27 DIAGNOSIS — E782 Mixed hyperlipidemia: Secondary | ICD-10-CM | POA: Diagnosis present

## 2021-05-27 DIAGNOSIS — H35033 Hypertensive retinopathy, bilateral: Secondary | ICD-10-CM | POA: Diagnosis present

## 2021-05-27 DIAGNOSIS — G4733 Obstructive sleep apnea (adult) (pediatric): Secondary | ICD-10-CM | POA: Diagnosis not present

## 2021-05-27 DIAGNOSIS — I2510 Atherosclerotic heart disease of native coronary artery without angina pectoris: Secondary | ICD-10-CM | POA: Diagnosis present

## 2021-05-27 DIAGNOSIS — Z794 Long term (current) use of insulin: Secondary | ICD-10-CM

## 2021-05-27 DIAGNOSIS — I10 Essential (primary) hypertension: Secondary | ICD-10-CM | POA: Diagnosis not present

## 2021-05-27 DIAGNOSIS — M86471 Chronic osteomyelitis with draining sinus, right ankle and foot: Secondary | ICD-10-CM

## 2021-05-27 DIAGNOSIS — Z79899 Other long term (current) drug therapy: Secondary | ICD-10-CM

## 2021-05-27 DIAGNOSIS — L089 Local infection of the skin and subcutaneous tissue, unspecified: Principal | ICD-10-CM

## 2021-05-27 DIAGNOSIS — Z7984 Long term (current) use of oral hypoglycemic drugs: Secondary | ICD-10-CM

## 2021-05-27 DIAGNOSIS — L97512 Non-pressure chronic ulcer of other part of right foot with fat layer exposed: Secondary | ICD-10-CM

## 2021-05-27 DIAGNOSIS — Z87891 Personal history of nicotine dependence: Secondary | ICD-10-CM

## 2021-05-27 DIAGNOSIS — E1169 Type 2 diabetes mellitus with other specified complication: Principal | ICD-10-CM | POA: Diagnosis present

## 2021-05-27 DIAGNOSIS — M86171 Other acute osteomyelitis, right ankle and foot: Secondary | ICD-10-CM | POA: Diagnosis present

## 2021-05-27 DIAGNOSIS — E559 Vitamin D deficiency, unspecified: Secondary | ICD-10-CM | POA: Diagnosis present

## 2021-05-27 DIAGNOSIS — N179 Acute kidney failure, unspecified: Secondary | ICD-10-CM | POA: Diagnosis present

## 2021-05-27 DIAGNOSIS — Z7982 Long term (current) use of aspirin: Secondary | ICD-10-CM

## 2021-05-27 DIAGNOSIS — Z841 Family history of disorders of kidney and ureter: Secondary | ICD-10-CM

## 2021-05-27 DIAGNOSIS — E11621 Type 2 diabetes mellitus with foot ulcer: Secondary | ICD-10-CM | POA: Diagnosis not present

## 2021-05-27 DIAGNOSIS — E1140 Type 2 diabetes mellitus with diabetic neuropathy, unspecified: Secondary | ICD-10-CM | POA: Diagnosis present

## 2021-05-27 DIAGNOSIS — E1122 Type 2 diabetes mellitus with diabetic chronic kidney disease: Secondary | ICD-10-CM | POA: Diagnosis present

## 2021-05-27 DIAGNOSIS — Z833 Family history of diabetes mellitus: Secondary | ICD-10-CM

## 2021-05-27 DIAGNOSIS — I130 Hypertensive heart and chronic kidney disease with heart failure and stage 1 through stage 4 chronic kidney disease, or unspecified chronic kidney disease: Secondary | ICD-10-CM | POA: Diagnosis present

## 2021-05-27 DIAGNOSIS — J45909 Unspecified asthma, uncomplicated: Secondary | ICD-10-CM | POA: Diagnosis present

## 2021-05-27 DIAGNOSIS — E876 Hypokalemia: Secondary | ICD-10-CM | POA: Diagnosis not present

## 2021-05-27 DIAGNOSIS — R197 Diarrhea, unspecified: Secondary | ICD-10-CM | POA: Diagnosis present

## 2021-05-27 DIAGNOSIS — T148XXA Other injury of unspecified body region, initial encounter: Secondary | ICD-10-CM

## 2021-05-27 DIAGNOSIS — Z20822 Contact with and (suspected) exposure to covid-19: Secondary | ICD-10-CM | POA: Diagnosis present

## 2021-05-27 DIAGNOSIS — I5032 Chronic diastolic (congestive) heart failure: Secondary | ICD-10-CM | POA: Diagnosis present

## 2021-05-27 DIAGNOSIS — L97516 Non-pressure chronic ulcer of other part of right foot with bone involvement without evidence of necrosis: Secondary | ICD-10-CM

## 2021-05-27 DIAGNOSIS — N1832 Chronic kidney disease, stage 3b: Secondary | ICD-10-CM | POA: Diagnosis present

## 2021-05-27 DIAGNOSIS — E118 Type 2 diabetes mellitus with unspecified complications: Secondary | ICD-10-CM | POA: Diagnosis present

## 2021-05-27 DIAGNOSIS — M1990 Unspecified osteoarthritis, unspecified site: Secondary | ICD-10-CM | POA: Diagnosis present

## 2021-05-27 DIAGNOSIS — E08621 Diabetes mellitus due to underlying condition with foot ulcer: Secondary | ICD-10-CM

## 2021-05-27 DIAGNOSIS — Z8249 Family history of ischemic heart disease and other diseases of the circulatory system: Secondary | ICD-10-CM

## 2021-05-27 DIAGNOSIS — J101 Influenza due to other identified influenza virus with other respiratory manifestations: Secondary | ICD-10-CM

## 2021-05-27 LAB — CBC WITH DIFFERENTIAL/PLATELET
Abs Immature Granulocytes: 0.04 10*3/uL (ref 0.00–0.07)
Basophils Absolute: 0 10*3/uL (ref 0.0–0.1)
Basophils Relative: 0 %
Eosinophils Absolute: 0.3 10*3/uL (ref 0.0–0.5)
Eosinophils Relative: 3 %
HCT: 37.7 % (ref 36.0–46.0)
Hemoglobin: 12.4 g/dL (ref 12.0–15.0)
Immature Granulocytes: 0 %
Lymphocytes Relative: 21 %
Lymphs Abs: 1.9 10*3/uL (ref 0.7–4.0)
MCH: 30.2 pg (ref 26.0–34.0)
MCHC: 32.9 g/dL (ref 30.0–36.0)
MCV: 91.7 fL (ref 80.0–100.0)
Monocytes Absolute: 0.5 10*3/uL (ref 0.1–1.0)
Monocytes Relative: 5 %
Neutro Abs: 6.3 10*3/uL (ref 1.7–7.7)
Neutrophils Relative %: 71 %
Platelets: 302 10*3/uL (ref 150–400)
RBC: 4.11 MIL/uL (ref 3.87–5.11)
RDW: 13.7 % (ref 11.5–15.5)
WBC: 9.1 10*3/uL (ref 4.0–10.5)
nRBC: 0 % (ref 0.0–0.2)

## 2021-05-27 LAB — RESP PANEL BY RT-PCR (FLU A&B, COVID) ARPGX2
Influenza A by PCR: POSITIVE — AB
Influenza B by PCR: NEGATIVE
SARS Coronavirus 2 by RT PCR: NEGATIVE

## 2021-05-27 LAB — COMPREHENSIVE METABOLIC PANEL
ALT: 12 U/L (ref 0–44)
AST: 14 U/L — ABNORMAL LOW (ref 15–41)
Albumin: 3.4 g/dL — ABNORMAL LOW (ref 3.5–5.0)
Alkaline Phosphatase: 63 U/L (ref 38–126)
Anion gap: 11 (ref 5–15)
BUN: 17 mg/dL (ref 6–20)
CO2: 26 mmol/L (ref 22–32)
Calcium: 8.6 mg/dL — ABNORMAL LOW (ref 8.9–10.3)
Chloride: 104 mmol/L (ref 98–111)
Creatinine, Ser: 1.76 mg/dL — ABNORMAL HIGH (ref 0.44–1.00)
GFR, Estimated: 35 mL/min — ABNORMAL LOW (ref >60)
Glucose, Bld: 130 mg/dL — ABNORMAL HIGH (ref 70–99)
Potassium: 3.5 mmol/L (ref 3.5–5.1)
Sodium: 141 mmol/L (ref 135–145)
Total Bilirubin: 0.5 mg/dL (ref 0.3–1.2)
Total Protein: 7.1 g/dL (ref 6.5–8.1)

## 2021-05-27 LAB — LACTIC ACID, PLASMA
Lactic Acid, Venous: 2.5 mmol/L (ref 0.5–1.9)
Lactic Acid, Venous: 1.8 mmol/L (ref 0.5–1.9)

## 2021-05-27 LAB — GLUCOSE, CAPILLARY: Glucose-Capillary: 210 mg/dL — ABNORMAL HIGH (ref 70–99)

## 2021-05-27 MED ORDER — ROSUVASTATIN CALCIUM 20 MG PO TABS
40.0000 mg | ORAL_TABLET | Freq: Every day | ORAL | Status: DC
  Administered 2021-05-27 – 2021-05-30 (×4): 40 mg via ORAL
  Filled 2021-05-27 (×4): qty 2

## 2021-05-27 MED ORDER — INSULIN ASPART 100 UNIT/ML IJ SOLN
0.0000 [IU] | Freq: Three times a day (TID) | INTRAMUSCULAR | Status: DC
  Administered 2021-05-28: 3 [IU] via SUBCUTANEOUS
  Administered 2021-05-29: 2 [IU] via SUBCUTANEOUS
  Administered 2021-05-29: 3 [IU] via SUBCUTANEOUS
  Administered 2021-05-29: 5 [IU] via SUBCUTANEOUS
  Administered 2021-05-30: 2 [IU] via SUBCUTANEOUS
  Administered 2021-05-30: 3 [IU] via SUBCUTANEOUS

## 2021-05-27 MED ORDER — OSELTAMIVIR PHOSPHATE 30 MG PO CAPS
30.0000 mg | ORAL_CAPSULE | Freq: Two times a day (BID) | ORAL | Status: DC
  Administered 2021-05-28 – 2021-05-30 (×6): 30 mg via ORAL
  Filled 2021-05-27 (×8): qty 1

## 2021-05-27 MED ORDER — OSELTAMIVIR PHOSPHATE 75 MG PO CAPS: 75.0000 mg | ORAL_CAPSULE | Freq: Two times a day (BID) | ORAL | Status: DC

## 2021-05-27 MED ORDER — GADOBUTROL 1 MMOL/ML IV SOLN
10.0000 mL | Freq: Once | INTRAVENOUS | Status: AC | PRN
  Administered 2021-05-27: 10 mL via INTRAVENOUS

## 2021-05-27 MED ORDER — ASPIRIN EC 81 MG PO TBEC: 81.0000 mg | DELAYED_RELEASE_TABLET | Freq: Every day | ORAL | Status: DC

## 2021-05-27 MED ORDER — INSULIN GLARGINE-YFGN 100 UNIT/ML ~~LOC~~ SOLN
10.0000 [IU] | Freq: Every day | SUBCUTANEOUS | Status: DC
  Administered 2021-05-28 – 2021-05-30 (×4): 10 [IU] via SUBCUTANEOUS
  Filled 2021-05-27 (×6): qty 0.1

## 2021-05-27 MED ORDER — ASPIRIN EC 81 MG PO TBEC
81.0000 mg | DELAYED_RELEASE_TABLET | Freq: Every day | ORAL | Status: DC
  Administered 2021-05-28 – 2021-05-30 (×3): 81 mg via ORAL
  Filled 2021-05-27 (×3): qty 1

## 2021-05-27 MED ORDER — CHLORHEXIDINE GLUCONATE CLOTH 2 % EX PADS: 6.0000 | MEDICATED_PAD | Freq: Once | CUTANEOUS | Status: AC

## 2021-05-27 MED ORDER — VANCOMYCIN HCL IN DEXTROSE 1-5 GM/200ML-% IV SOLN
1000.0000 mg | INTRAVENOUS | Status: DC
  Administered 2021-05-28: 1000 mg via INTRAVENOUS
  Filled 2021-05-27 (×2): qty 200

## 2021-05-27 MED ORDER — CHLORHEXIDINE GLUCONATE CLOTH 2 % EX PADS
6.0000 | MEDICATED_PAD | Freq: Once | CUTANEOUS | Status: AC
  Administered 2021-05-27: 6 via TOPICAL

## 2021-05-27 MED ORDER — HYDROXYZINE HCL 25 MG PO TABS
25.0000 mg | ORAL_TABLET | Freq: Every day | ORAL | Status: DC
  Administered 2021-05-27 – 2021-05-29 (×3): 25 mg via ORAL
  Filled 2021-05-27 (×3): qty 1

## 2021-05-27 MED ORDER — VANCOMYCIN HCL 2000 MG/400ML IV SOLN
2000.0000 mg | Freq: Once | INTRAVENOUS | Status: AC
  Administered 2021-05-27: 2000 mg via INTRAVENOUS
  Filled 2021-05-27: qty 400

## 2021-05-27 MED ORDER — HEPARIN SODIUM (PORCINE) 5000 UNIT/ML IJ SOLN
5000.0000 [IU] | Freq: Three times a day (TID) | INTRAMUSCULAR | Status: AC
  Administered 2021-05-27 – 2021-05-28 (×2): 5000 [IU] via SUBCUTANEOUS
  Filled 2021-05-27 (×2): qty 1

## 2021-05-27 MED ORDER — AMLODIPINE BESYLATE 10 MG PO TABS
10.0000 mg | ORAL_TABLET | Freq: Every day | ORAL | Status: DC
  Administered 2021-05-28 – 2021-05-30 (×3): 10 mg via ORAL
  Filled 2021-05-27 (×3): qty 1

## 2021-05-27 MED ORDER — OSELTAMIVIR PHOSPHATE 75 MG PO CAPS: 75.0000 mg | ORAL_CAPSULE | Freq: Every day | ORAL | Status: DC

## 2021-05-27 MED ORDER — CARVEDILOL 25 MG PO TABS
25.0000 mg | ORAL_TABLET | Freq: Two times a day (BID) | ORAL | Status: DC
  Administered 2021-05-27 – 2021-05-30 (×6): 25 mg via ORAL
  Filled 2021-05-27 (×6): qty 1

## 2021-05-27 MED ORDER — HYDROXYZINE PAMOATE 25 MG PO CAPS
25.0000 mg | ORAL_CAPSULE | Freq: Every day | ORAL | Status: DC
  Filled 2021-05-27: qty 1

## 2021-05-27 NOTE — Telephone Encounter (Signed)
Patient called the office wanting to know if she can move her procedure sooner than waiting.    Please advise .Marland KitchenMarland KitchenMarland Kitchen

## 2021-05-27 NOTE — ED Notes (Signed)
Attempted report [phone] 

## 2021-05-27 NOTE — ED Triage Notes (Signed)
Pt states she has hx of osteomyelitis in right foot. Pt has developed wound on base of right toe. Pt's [place] nurse concerned that pt will need IV antibiotics as wound has odor and is draining.

## 2021-05-27 NOTE — H&P (Addendum)
[facility] Admission History and Physical Service Pager: [phone]  Patient name: [name] Medical record number: [id number] Date of birth: 1971-08-01 Age: 50 y.o. Gender: female  Primary Care Provider: [name], MD Consultants: Podiatry Code Status: Full Preferred Emergency Contact: Daughter, Lucianne Lei [phone]  Chief Complaint: Ulcer of right great toe  Assessment and Plan: [name] is a 50 y.o. female presenting with right toe osteomyelitis. PMH is significant for T2DM, HTN, CKD, Diabetic Retinopathy, Lt Thyroid Nodule, Pseudotumor Cerebri,  Stroke, HLD, Asthma, CAD, HFpEF, OSA  Osteomyelitis of right great toe Patient has ulcerated wound on dorsum of right great toe, has been present for almost a year since patient injured toe when she stubbed it. patient was sent over by Dr. Posey Pronto who saw her yesterday and recommended she come to the hospital for IV antibiotics, MRI of R foot, and likely amputation due to worsening ulcer on the dorsum of the right great toe which is approximately 0.5 cm deep. Patient does not meet SIRS criteria.  She is tachycardic into the low 100s, lactic acid is elevated at 2.5, repeat 1.8.   Initial BP in ED was elevated to 181/102 but has come down to 157/85 and patient remains asymptomatic.  White blood cell count is WNL and pt has been afebrile.  Blood cultures have been collected.  Patient was started on vancomycin in the ED.  X-ray of right great toe showed findings suspicious for osteomyelitis and diffuse soft tissue swelling.  Podiatry has been consulted and has seen patient.  They plan on amputation tomorrow afternoon around 4 PM. -Admitted to family practice teaching service, attending Dr. Owens Shark -Podiatry consulted -MRI of right foot with and without contrast -Plan for amputation tomorrow afternoon -N.p.o. after midnight -Continue vancomycin 1g IV twice daily -Follow-up blood cultures -AM  CBC -Continuous cardiac monitoring -OT/PT eval and treat -Fall precautions -Incentive spirometry  Flu a positive  Patient had incidental finding of testing positive for flu A.  Currently is asymptomatic. -Tamiflu 30 mg BID for 5 days (renal dosing for CrCl 30-60) -Chest xray -Monitor resp status  DM Type 2 Home medications include NovoLog 70/30 (15 units in a.m. and 17 units in p.m.), glipizide 10 mg daily, and metformin 1000 mg twice daily. Glucose on admission is 130. -Hold metformin in the setting of MRI with contrast  -Holding home glipizide and NovoLog -Moderate SSI -Semglee 10 u daily -CBG monitoring 4 times daily before meals and at bedtime -Hemoglobin A1c ordered  CKD Stage 3b GFR is 35.  Has been stage 3b for at least 1 year per chart review. Baseline creatinine appears to be around 1.5-1.6.  Creatinine today of 1.76 -Avoid nephrotoxic medications -Daily BMP  HTN Home medications include Lotrel(amlodipine/Benazepril) 10-40 mg, and carvedilol 25 mg twice daily.  Blood pressure on admission was elevated to 181/102 but patient remained asymptomatic.  Blood pressure has come down significantly to 157/85. -Amlodipine 10 mg daily -Carvedilol 25 mg twice daily -Holding Lotrel as it contains benazepril and we being cautious due to kidney function  HLD Lipid profile from 07/2019 shows total cholesterol of 167, LDL of 81 and triglycerides of 287.  Home medication includes rosuvastatin 40 mg daily -Continue rosuvastatin 40 mg daily  CAD  hx of CVA Patient states she had a stroke 2 years ago and had some left-sided weakness, recovered and has no deficits.  Medications include aspirin 81 mg daily and rosuvastatin 40 mg daily -Continue ASA 81 mg daily -Continue  rosuvastatin 40 mg daily  HFpEF Echo from 11/2019 shows EF of 60-65% and severe LVH.  Home medication includes isosorbide-hydralazine 20-37.5 mg 2 tablets 3 times daily and furosemide 40 mg twice daily.  Patient does not  appear to be fluid overloaded on exam. -Holding home medications  OSA Patient uses CPAP at home -CPAP at night  FEN/GI: Heart healthy/carb modified.  N.p.o. at midnight Prophylaxis: Heparin s/c  Disposition: MedSurg  History of Present Illness:  [name] is a 50 y.o. female presenting with diabetic ulcer of right great toe with likely osteomyelitis.   Pt states she first injured her toe when she stubbed it on a ridge in the floor in December 2021.  States this has never fully healed.  Has been seen at the wound care center and has been in a boot and a cast.  Cast was removed in June 2022.  Patient states wound appeared to be healing at that point and she started wearing her regular shoes and the wound started getting worse again.  It has significantly worsened over the past few weeks in terms of odor and drainage but patient denies any pain.  Patient saw Dr. Posey Pronto yesterday who recommended she come to the hospital for IV antibiotics, MRI, and amputation.  Review Of Systems: Per HPI with the following additions:   Review of Systems  Constitutional:  Negative for chills and fever.  Respiratory:  Negative for cough, chest tightness and shortness of breath.   Genitourinary:  Negative for difficulty urinating.  Skin:  Positive for wound.  Neurological:  Positive for headaches.    Patient Active Problem List   Diagnosis Date Noted   Diabetic foot ulcer ([place]) 05/27/2021   PAC (premature atrial contraction) 02/15/2021   Symptomatic anemia 06/25/2020   Chronic heart failure with preserved ejection fraction ([place]) 06/25/2020   Educated about COVID-19 virus infection 04/29/2020   Class 2 severe obesity with serious comorbidity and body mass index (BMI) of 36.0 to 36.9 in adult ([place]) 03/30/2020   NSVT (nonsustained ventricular tachycardia) 11/13/2019   Pericardial effusion 09/17/2019   H/O: stroke 04/09/2019   Left ventricular hypertrophy 04/09/2019   Mixed hyperlipidemia 04/08/2019    Uncontrolled type 2 diabetes mellitus with hyperglycemia ([place]) 04/08/2019   Non compliance w medication regimen 02/20/2019   Diabetes mellitus type 2 in obese ([place])    Resistant hypertension    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[place]) 02/13/2019   Cerebrovascular accident (CVA) ([place])    Hypokalemia    Hypomagnesemia    AKI (acute kidney injury) ([place])    Acute CVA (cerebrovascular accident) ([place]) 02/11/2019   Hypertensive urgency 02/11/2019   ARF (acute renal failure) ([place]) 02/11/2019   Type 2 diabetes mellitus with vascular disease ([place]) 02/11/2019   Diabetes mellitus, type 2 ([place]) 04/12/2018   Essential hypertension 01/08/2018   Obstructive sleep apnea 08/26/2017    Past Medical History: Past Medical History:  Diagnosis Date   Arthritis    Asthma    Cataract    Mixed form OU   CKD (chronic kidney disease)    Coronary artery disease    Diabetes mellitus    Diabetic retinopathy ([place])    NPDR OU   Hyperlipidemia    Hypertension    Hypertensive retinopathy    OU   Left thyroid nodule    diagnosed 07/2018   PAC (premature atrial contraction) 02/15/2021   Pseudotumor cerebri    Stroke [facility])    Vitamin D deficiency     Past Surgical  History: Past Surgical History:  Procedure Laterality Date   ACHILLES TENDON REPAIR     ACHILLES TENDON SURGERY Left 02/19/2021   Procedure: ACHILLES TENDON REPAIR WITH GRAFT;  Surgeon: Felipa Furnace, DPM;  Location: [facility];  Service: Podiatry;  Laterality: Left;  BLOCK   GASTROC RECESSION EXTREMITY Left 02/19/2021   Procedure: GASTROC RECESSION EXTREMITY;  Surgeon: Felipa Furnace, DPM;  Location: [facility];  Service: Podiatry;  Laterality: Left;  Block   TUBAL LIGATION      Social History: Social History   Tobacco Use   Smoking status: Former    Packs/day: 0.50    Years: 30.00    Pack years: 15.00    Types: Cigarettes    Quit date: 2017    Years since quitting: 5.8   Smokeless tobacco: Never  Vaping Use    Vaping Use: Never used  Substance Use Topics   Alcohol use: Not Currently    Alcohol/week: 0.0 standard drinks    Comment: rare   Drug use: Yes    Types: Marijuana    Comment: daily    Please also refer to relevant sections of EMR.  Family History: Family History  Problem Relation Age of Onset   Asthma Mother    Diabetes Father    Hypertension Father    Kidney disease Father    Hypertension Sister    Asthma Sister    Congestive Heart Failure Maternal Aunt    Diabetes Maternal Grandmother    Congestive Heart Failure Maternal Grandmother    Lung disease Maternal Grandmother    Asthma Other    Hyperlipidemia Other    Hypertension Other    Cancer Other      Allergies and Medications: Allergies  Allergen Reactions   Bee Pollen     Seasonal Allergies   Dust Mite Extract     Sneezing & Cough   No current facility-administered medications on file prior to encounter.   Current Outpatient Medications on File Prior to Encounter  Medication Sig Dispense Refill   albuterol (VENTOLIN HFA) 108 (90 Base) MCG/ACT inhaler Inhale into the lungs every 6 (six) hours as needed for wheezing or shortness of breath.     amLODipine-benazepril (LOTREL) 10-40 MG capsule TAKE 1 CAPSULE BY MOUTH EVERY DAY 90 capsule 2   aspirin 81 MG EC tablet Take 1 tablet (81 mg total) by mouth daily. 90 tablet 2   carvedilol (COREG) 25 MG tablet Take 1 tablet (25 mg total) by mouth 2 (two) times daily with a meal. 180 tablet 2   Cholecalciferol (VITAMIN D3) 25 MCG (1000 UT) CAPS Take 1 capsule by mouth daily.     folic acid (FOLVITE) 1 MG tablet Take 1 mg by mouth daily.     furosemide (LASIX) 40 MG tablet Take 40 mg by mouth 2 (two) times daily.     glipiZIDE (GLUCOTROL) 10 MG tablet Take 10 mg by mouth daily.     hydrOXYzine (VISTARIL) 25 MG capsule Take 25 mg by mouth at bedtime.     insulin aspart protamine- aspart (NOVOLOG MIX 70/30) (70-30) 100 UNIT/ML injection Inject 0.15 mLs (15 Units total) into  the skin 2 (two) times daily with a meal. (Patient taking differently: Inject 15-17 Units into the skin See admin instructions. Inject 15 units subcutaneous in the morning, then inject 17 units subcutaneous in the evening) 10 mL 11   ipratropium-albuterol (DUONEB) 0.5-2.5 (3) MG/3ML SOLN Take 3 mLs by nebulization every 4 (four)  hours as needed. (Patient taking differently: Take 3 mLs by nebulization every 4 (four) hours as needed (sob/wheezing).) 360 mL 0   isosorbide-hydrALAZINE (BIDIL) 20-37.5 MG tablet Take 2 tablets by mouth 3 (three) times daily. 540 tablet 3   metFORMIN (GLUCOPHAGE) 1000 MG tablet Take 1,000 mg by mouth 2 (two) times daily.     rosuvastatin (CRESTOR) 40 MG tablet Take 40 mg by mouth daily.     vitamin B-12 (CYANOCOBALAMIN) 1000 MCG tablet Take 1,000 mcg by mouth daily.     Accu-Chek FastClix Lancets MISC 4 (four) times daily.     ACCU-CHEK GUIDE test strip 1 each by Other route See admin instructions. for testing 100 each 2   blood glucose meter kit and supplies KIT Dispense based on patient and insurance preference. Use up to four times daily as directed. (FOR ICD-9 250.00, 250.01). 1 each 0   HYDROcodone-acetaminophen (NORCO) 5-325 MG tablet Take 1 tablet by mouth every 6 (six) hours as needed for moderate pain. (Patient not taking: No sig reported) 30 tablet 0   ibuprofen (ADVIL) 800 MG tablet Take 1 tablet (800 mg total) by mouth every 6 (six) hours as needed. (Patient not taking: No sig reported) 60 tablet 1   Insulin Syringes, Disposable, Z-610 1 ML MISC 1 application by Does not apply route 2 (two) times a day. 100 each 0   ketorolac (TORADOL) 10 MG tablet Take 1 tablet (10 mg total) by mouth every 6 (six) hours as needed. (Patient not taking: Reported on 05/27/2021) 20 tablet 0   oxyCODONE-acetaminophen (PERCOCET) 5-325 MG tablet Take 1-2 tablets by mouth every 4 (four) hours as needed for severe pain. (Patient not taking: No sig reported) 30 tablet 0    Objective: BP  (!) 157/85   Pulse (!) 101   Temp 97.8 F (36.6 C) (Oral)   Resp 18   SpO2 99%  Exam: General: Patient sitting up in bed, very pleasant, NAD Eyes: White sclera, clear conjunctive a Cardiovascular: RRR, normal S1/S2, no murmurs Respiratory: CTAB Extremities: Ulcerated wound on bottom of right great toe, fat layer exposed, approximately 0.5 cm deep, not currently draining, see photo in media tab Neuro: Alert and oriented, no focal deficits Psych: Mood and affect appropriate for situation  Labs and Imaging: CBC BMET  Recent Labs  Lab 05/27/21 1458  WBC 9.1  HGB 12.4  HCT 37.7  PLT 302   Recent Labs  Lab 05/27/21 1458  NA 141  K 3.5  CL 104  CO2 26  BUN 17  CREATININE 1.76*  GLUCOSE 130*  CALCIUM 8.6Precious Gilding, DO 05/27/2021, 6:15 PM PGY-1, [place] [facility] pager: [phone], text pages welcome   FPTS Upper-Level Resident Addendum   I have independently interviewed and examined the patient. I have discussed the above with the original author and agree with their documentation. Please see also any attending notes.    Carollee Leitz MD PGY-3, [facility] 05/27/2021 9:34 PM  [employer] Service pager: [phone] (text pages welcome through [facility])

## 2021-05-27 NOTE — Progress Notes (Signed)
Pharmacy Antibiotic Note  [name] is a 50 y.o. female admitted on 05/27/2021 with  wound infection .  Pharmacy has been consulted for vancomycin dosing. Patient is afebrile with WBC 9.1 and SCr 1.76. No weight yet this admission, but patient reports weighing 240 lbs when she weighed herself 2 weeks ago, and this aligns with records from previous visits.   Plan: Vancomycin 2000 mg IV x1 Followed by vancomycin 1000 mg IV q24      eAUC 479 using SCr 1.76 and Vd 0.5 Monitor labs, levels, cultures, and clinical improvement    Temp (24hrs), Avg:97.8 F (36.6 C), Min:97.8 F (36.6 C), Max:97.8 F (36.6 C)  Recent Labs  Lab 05/27/21 1458  WBC 9.1  CREATININE 1.76*    CrCl cannot be calculated (Unknown ideal weight.).    No Known Allergies  Antimicrobials this admission: Vancomycin 10/20>>  Microbiology results: 10/20 BCx: in process  Thank you for allowing pharmacy to be a part of this patient's care.  Donald Pore 05/27/2021 3:48 PM

## 2021-05-27 NOTE — Progress Notes (Signed)
RRT educated Pt on how to use CPAP machine and Pt physically showed RRT that she can do it. Says she wants to be able to do it on own and has her own at home CPAP.

## 2021-05-27 NOTE — ED Provider Notes (Signed)
Emergency Medicine Provider Triage Evaluation Note  [name] , a 50 y.o. female  was evaluated in triage.  Pt complains of ulcer to right big toe.  Was seen by her wound care doctor yesterday, they advised admission to the hospital IV antibiotics and MRI and amputation.  Patient missed her MRI appointment on Saturday last week.  She denies any fever or chills, notes there has been purulent drainage surrounding the right big toe as well as a malodorous scent.  Patient is diabetic, takes blood pressure medicine.  Had not taken her afternoon blood pressure medicine while in triage..  Review of Systems  Positive: Ulcer to the right great toe Negative: Chest pain, shortness of breath  Physical Exam  BP (!) 181/102 (BP Location: Right Arm)   Pulse 99   Temp 97.8 F (36.6 C) (Oral)   Resp 16   SpO2 100%  Gen:   Awake, no distress   Resp:  Normal effort  MSK:   Moves extremities without difficulty  Other:  Bilateral lower extremity edema.  DP PT weak but equal bilaterally  Medical Decision Making  Medically screening exam initiated at 2:10 PM.  Appropriate orders placed.  [name] was informed that the remainder of the evaluation will be completed by another provider, this initial triage assessment does not replace that evaluation, and the importance of remaining in the ED until their evaluation is complete.  Labs, patient will likely need imaging and IV antibiotics   Sherrill Raring, PA-C 05/27/21 1411    Jeanell Sparrow, DO 05/28/21 2003

## 2021-05-27 NOTE — ED Provider Notes (Signed)
[facility] EMERGENCY DEPARTMENT Provider Note   CSN: [id number] Arrival date & time: 05/27/21  1341     History Chief Complaint  Patient presents with   Wound Check    [name] is a 50 y.o. female with a past medical history significant for asthma, CKD, CAD, diabetes, hyperlipidemia, hypertension, pseudotumor cerebri, and history of CVA who presents to the ED due to right great toe infection that has worsened over the past few weeks.  Patient states ulcer has been present for the past year.  No fever or chills.  She admits to associated purulent drainage and malodor smell. Chart reviewed.  Patient saw Dr. Posey Pronto yesterday with podiatry who recommended admission for IV antibiotics, MRI, and possible amputation.  No treatment prior to arrival.  Pain worse with palpation and ambulation. No alleviating factors.   History obtained from patient and past medical records. No interpreter used during encounter.       Past Medical History:  Diagnosis Date   Arthritis    Asthma    Cataract    Mixed form OU   CKD (chronic kidney disease)    Coronary artery disease    Diabetes mellitus    Diabetic retinopathy ([place])    NPDR OU   Hyperlipidemia    Hypertension    Hypertensive retinopathy    OU   Left thyroid nodule    diagnosed 07/2018   PAC (premature atrial contraction) 02/15/2021   Pseudotumor cerebri    Stroke [facility])    Vitamin D deficiency     Patient Active Problem List   Diagnosis Date Noted   PAC (premature atrial contraction) 02/15/2021   Symptomatic anemia 06/25/2020   Chronic heart failure with preserved ejection fraction ([place]) 06/25/2020   Educated about COVID-19 virus infection 04/29/2020   Class 2 severe obesity with serious comorbidity and body mass index (BMI) of 36.0 to 36.9 in adult ([place]) 03/30/2020   NSVT (nonsustained ventricular tachycardia) 11/13/2019   Pericardial effusion 09/17/2019   H/O: stroke 04/09/2019   Left  ventricular hypertrophy 04/09/2019   Mixed hyperlipidemia 04/08/2019   Uncontrolled type 2 diabetes mellitus with hyperglycemia ([place]) 04/08/2019   Non compliance w medication regimen 02/20/2019   Diabetes mellitus type 2 in obese ([place])    Resistant hypertension    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[place]) 02/13/2019   Cerebrovascular accident (CVA) ([place])    Hypokalemia    Hypomagnesemia    AKI (acute kidney injury) ([place])    Acute CVA (cerebrovascular accident) ([place]) 02/11/2019   Hypertensive urgency 02/11/2019   ARF (acute renal failure) ([place]) 02/11/2019   Type 2 diabetes mellitus with vascular disease ([place]) 02/11/2019   Diabetes mellitus, type 2 ([place]) 04/12/2018   Essential hypertension 01/08/2018   Obstructive sleep apnea 08/26/2017    Past Surgical History:  Procedure Laterality Date   ACHILLES TENDON REPAIR     ACHILLES TENDON SURGERY Left 02/19/2021   Procedure: ACHILLES TENDON REPAIR WITH GRAFT;  Surgeon: Felipa Furnace, DPM;  Location: [place];  Service: Podiatry;  Laterality: Left;  BLOCK   GASTROC RECESSION EXTREMITY Left 02/19/2021   Procedure: GASTROC RECESSION EXTREMITY;  Surgeon: Felipa Furnace, DPM;  Location: [place];  Service: Podiatry;  Laterality: Left;  Block   TUBAL LIGATION       OB History     Gravida  4   Para  4   Term  4   Preterm      AB  Living  4      SAB      IAB      Ectopic      Multiple      Live Births              Family History  Problem Relation Age of Onset   Asthma Mother    Diabetes Father    Hypertension Father    Kidney disease Father    Hypertension Sister    Asthma Sister    Congestive Heart Failure Maternal Aunt    Diabetes Maternal Grandmother    Congestive Heart Failure Maternal Grandmother    Lung disease Maternal Grandmother    Asthma Other    Hyperlipidemia Other    Hypertension Other    Cancer Other     Social History   Tobacco Use   Smoking status: Former     Packs/day: 0.50    Years: 30.00    Pack years: 15.00    Types: Cigarettes    Quit date: 2017    Years since quitting: 5.8   Smokeless tobacco: Never  Vaping Use   Vaping Use: Never used  Substance Use Topics   Alcohol use: Not Currently    Alcohol/week: 0.0 standard drinks    Comment: rare   Drug use: Yes    Types: Marijuana    Comment: daily    Home Medications Prior to Admission medications   Medication Sig Start Date End Date Taking? Authorizing Provider  Accu-Chek FastClix Lancets MISC 4 (four) times daily. 05/28/19   [provider]  ACCU-CHEK GUIDE test strip 1 each by Other route See admin instructions. for testing 08/28/20   Woodroe Mode, MD  albuterol (VENTOLIN HFA) 108 (90 Base) MCG/ACT inhaler Inhale into the lungs every 6 (six) hours as needed for wheezing or shortness of breath.    [provider]  amLODipine-benazepril (LOTREL) 10-40 MG capsule TAKE 1 CAPSULE BY MOUTH EVERY DAY 03/19/21   Patwardhan, Reynold Bowen, MD  aspirin 81 MG EC tablet Take 1 tablet (81 mg total) by mouth daily. 10/14/20   Patwardhan, Reynold Bowen, MD  blood glucose meter kit and supplies KIT Dispense based on patient and insurance preference. Use up to four times daily as directed. (FOR ICD-9 250.00, 250.01). 04/12/18   Martyn Ehrich, NP  carvedilol (COREG) 25 MG tablet Take 1 tablet (25 mg total) by mouth 2 (two) times daily with a meal. 10/14/20   Patwardhan, Manish J, MD  Cholecalciferol (VITAMIN D3) 25 MCG (1000 UT) CAPS Take 1 capsule by mouth daily. 05/28/19   [provider]  cloNIDine (CATAPRES) 0.2 MG tablet Take 0.2 mg by mouth 2 (two) times daily.    [provider]  folic acid (FOLVITE) 1 MG tablet Take 1 mg by mouth daily.    [provider]  furosemide (LASIX) 20 MG tablet Take 2 tablets (40 mg total) by mouth 2 (two) times daily. Take in the morning and afternoon 10/14/20   Patwardhan, Manish J, MD  glipiZIDE (GLUCOTROL) 10 MG tablet Take 10 mg by  mouth daily. 03/19/19   [provider]  HYDROcodone-acetaminophen (NORCO) 5-325 MG tablet Take 1 tablet by mouth every 6 (six) hours as needed for moderate pain. Patient not taking: Reported on 04/23/2021 02/22/21   Felipa Furnace, DPM  hydrOXYzine (VISTARIL) 25 MG capsule Take 25 mg by mouth at bedtime. 02/02/21   [provider]  ibuprofen (ADVIL) 800 MG tablet Take 1 tablet (800  mg total) by mouth every 6 (six) hours as needed. Patient not taking: Reported on 04/23/2021 02/19/21   Felipa Furnace, DPM  insulin aspart protamine- aspart (NOVOLOG MIX 70/30) (70-30) 100 UNIT/ML injection Inject 0.15 mLs (15 Units total) into the skin 2 (two) times daily with a meal. Patient taking differently: Inject 15-17 Units into the skin See admin instructions. Inject 15 units subcutaneous in the morning, then inject 17 units subcutaneous in the evening 02/20/19   Love, Ivan Anchors, PA-C  Insulin Syringes, Disposable, X-323 1 ML MISC 1 application by Does not apply route 2 (two) times a day. 02/20/19   Love, Ivan Anchors, PA-C  ipratropium-albuterol (DUONEB) 0.5-2.5 (3) MG/3ML SOLN Take 3 mLs by nebulization every 4 (four) hours as needed. 06/25/20   Little Ishikawa, MD  isosorbide-hydrALAZINE (BIDIL) 20-37.5 MG tablet Take 2 tablets by mouth 3 (three) times daily. 10/14/20   Patwardhan, Reynold Bowen, MD  ketorolac (TORADOL) 10 MG tablet Take 1 tablet (10 mg total) by mouth every 6 (six) hours as needed. 02/22/21   Felipa Furnace, DPM  metFORMIN (GLUCOPHAGE) 1000 MG tablet Take 1,000 mg by mouth 2 (two) times daily. 03/19/19   [provider]  oxyCODONE-acetaminophen (PERCOCET) 5-325 MG tablet Take 1-2 tablets by mouth every 4 (four) hours as needed for severe pain. Patient not taking: Reported on 04/23/2021 02/19/21   Felipa Furnace, DPM  rosuvastatin (CRESTOR) 40 MG tablet Take 40 mg by mouth daily.    [provider]  vitamin B-12 (CYANOCOBALAMIN) 1000 MCG tablet Take 1,000 mcg by mouth  daily. Patient not taking: Reported on 04/23/2021    [provider]    Allergies    Patient has no known allergies.  Review of Systems   Review of Systems  Constitutional:  Negative for chills and fever.  Skin:  Positive for color change and wound.  All other systems reviewed and are negative.  Physical Exam Updated Vital Signs BP (!) 146/74   Pulse (!) 106   Temp 97.8 F (36.6 C) (Oral)   Resp 16   SpO2 98%   Physical Exam Vitals and nursing note reviewed.  Constitutional:      General: She is not in acute distress.    Appearance: She is not ill-appearing.  HENT:     Head: Normocephalic.  Eyes:     Pupils: Pupils are equal, round, and reactive to light.  Cardiovascular:     Rate and Rhythm: Normal rate and regular rhythm.     Pulses: Normal pulses.     Heart sounds: Normal heart sounds. No murmur heard.   No friction rub. No gallop.  Pulmonary:     Effort: Pulmonary effort is normal.     Breath sounds: Normal breath sounds.  Abdominal:     General: Abdomen is flat. There is no distension.     Palpations: Abdomen is soft.     Tenderness: There is no abdominal tenderness. There is no guarding or rebound.  Musculoskeletal:        General: Normal range of motion.     Cervical back: Neck supple.     Comments: Right pedal pulses palpable.   Skin:    General: Skin is warm and dry.     Comments: Ulcer to right great toe with malodor. See photo below.   Neurological:     General: No focal deficit present.     Mental Status: She is alert.  Psychiatric:  Mood and Affect: Mood normal.        Behavior: Behavior normal.     ED Results / Procedures / Treatments   Labs (all labs ordered are listed, but only abnormal results are displayed) Labs Reviewed  COMPREHENSIVE METABOLIC PANEL - Abnormal; Notable for the following components:      Result Value   Glucose, Bld 130 (*)    Creatinine, Ser 1.76 (*)    Calcium 8.6 (*)    Albumin 3.4 (*)    AST 14  (*)    GFR, Estimated 35 (*)    All other components within normal limits  RESP PANEL BY RT-PCR (FLU A&B, COVID) ARPGX2  CULTURE, BLOOD (ROUTINE X 2)  CULTURE, BLOOD (ROUTINE X 2)  CBC WITH DIFFERENTIAL/PLATELET  LACTIC ACID, PLASMA  LACTIC ACID, PLASMA    EKG None  Radiology DG Toe Great Right  Result Date: 05/27/2021 CLINICAL DATA:  Wound, neuropathy EXAM: RIGHT GREAT TOE COMPARISON:  10/09/2020 FINDINGS: Frontal, oblique, and lateral views of the right great toe are obtained. There is extensive soft tissue swelling, with overlying bandaging material limiting evaluation. No obvious subcutaneous gas or radiopaque foreign body. Since previous exam, there is subtle cortical erosion along the lateral margin of the distal tuft of the first distal phalanx suspicious for osteomyelitis. In addition, there is persistent flexion deformity of the first interphalangeal joint. Stable osteoarthritis. IMPRESSION: 1. Findings suspicious for osteomyelitis involving the lateral margin distal tuft first distal phalanx. 2. Diffuse soft tissue swelling. Electronically Signed   By: Randa Ngo M.D.   On: 05/27/2021 15:03    Procedures Procedures   Medications Ordered in ED Medications  vancomycin (VANCOREADY) IVPB 2000 mg/400 mL (has no administration in time range)  vancomycin (VANCOCIN) IVPB 1000 mg/200 mL premix (has no administration in time range)    ED Course  I have reviewed the triage vital signs and the nursing notes.  Pertinent labs & imaging results that were available during my care of the patient were reviewed by me and considered in my medical decision making (see chart for details).    MDM Rules/Calculators/A&P                          50 year old female presents to the ED from podiatry for admission for IV antibiotics, MRI right foot, and amputation.  Patient has a right great toe ulcer which has progressively worsened.  No fever or chills.  Upon arrival, stable vitals.  BP  elevated 181/102 however, patient currently asymptomatic.  Low suspicion for hypertensive urgency/emergency.  Labs, blood cultures, and MRI foot ordered.  COVID for admission.  Patient started on IV vancomycin.  CBC unremarkable.  No leukocytosis and normal hemoglobin.  CMP significant for elevated creatinine 1.73.  Normal BUN.  Hyperglycemia 130.  No anion gap.  Creatinine appears to be at baseline. X-ray concerning for osteomyelitis. MRI ordered.   4:24 PM Discussed with Dr. Ronnald Ramp who agrees to admit patient for further treatment.   4:35 PM Spoke to Dr. Blenda Mounts with podiatry who notes patient will most likely go to the OR tomorrow afternoon. Keep NPO after midnight.   Final Clinical Impression(s) / ED Diagnoses Final diagnoses:  Wound infection    Rx / DC Orders ED Discharge Orders     None        Suzy Bouchard, PA-C 05/27/21 1636    Jeanell Sparrow, DO 05/29/21 1243

## 2021-05-27 NOTE — Progress Notes (Signed)
Subjective:  Patient ID: [name], female    DOB: [birth year],  MRN: [id number]  Chief Complaint  Patient presents with   Foot Ulcer    Right hallux ulcer      50 y.o. female returns for post-op check.  Patient presents with right hallux ulceration that seems to have gotten worse.  She states that local wound care she has been doing it but it seems to be more malodor present.  She denies any other acute complaints she would like to discuss treatment options.  Review of Systems: Negative except as noted in the HPI. Denies N/V/F/Ch.  Past Medical History:  Diagnosis Date   Arthritis    Asthma    Cataract    Mixed form OU   CKD (chronic kidney disease)    Coronary artery disease    Diabetes mellitus    Diabetic retinopathy ([place])    NPDR OU   Hyperlipidemia    Hypertension    Hypertensive retinopathy    OU   Left thyroid nodule    diagnosed 07/2018   PAC (premature atrial contraction) 02/15/2021   Pseudotumor cerebri    Stroke [facility])    Vitamin D deficiency     Current Outpatient Medications:    Accu-Chek FastClix Lancets MISC, 4 (four) times daily., Disp: , Rfl:    ACCU-CHEK GUIDE test strip, 1 each by Other route See admin instructions. for testing, Disp: 100 each, Rfl: 2   albuterol (VENTOLIN HFA) 108 (90 Base) MCG/ACT inhaler, Inhale into the lungs every 6 (six) hours as needed for wheezing or shortness of breath., Disp: , Rfl:    amLODipine-benazepril (LOTREL) 10-40 MG capsule, TAKE 1 CAPSULE BY MOUTH EVERY DAY, Disp: 90 capsule, Rfl: 2   aspirin 81 MG EC tablet, Take 1 tablet (81 mg total) by mouth daily., Disp: 90 tablet, Rfl: 2   blood glucose meter kit and supplies KIT, Dispense based on patient and insurance preference. Use up to four times daily as directed. (FOR ICD-9 250.00, 250.01)., Disp: 1 each, Rfl: 0   carvedilol (COREG) 25 MG tablet, Take 1 tablet (25 mg total) by mouth 2 (two) times daily with a meal., Disp: 180 tablet, Rfl: 2   Cholecalciferol  (VITAMIN D3) 25 MCG (1000 UT) CAPS, Take 1 capsule by mouth daily., Disp: , Rfl:    cloNIDine (CATAPRES) 0.2 MG tablet, Take 0.2 mg by mouth 2 (two) times daily., Disp: , Rfl:    folic acid (FOLVITE) 1 MG tablet, Take 1 mg by mouth daily., Disp: , Rfl:    furosemide (LASIX) 20 MG tablet, Take 2 tablets (40 mg total) by mouth 2 (two) times daily. Take in the morning and afternoon, Disp: 90 tablet, Rfl: 3   glipiZIDE (GLUCOTROL) 10 MG tablet, Take 10 mg by mouth daily., Disp: , Rfl:    HYDROcodone-acetaminophen (NORCO) 5-325 MG tablet, Take 1 tablet by mouth every 6 (six) hours as needed for moderate pain. (Patient not taking: Reported on 04/23/2021), Disp: 30 tablet, Rfl: 0   hydrOXYzine (VISTARIL) 25 MG capsule, Take 25 mg by mouth at bedtime., Disp: , Rfl:    ibuprofen (ADVIL) 800 MG tablet, Take 1 tablet (800 mg total) by mouth every 6 (six) hours as needed. (Patient not taking: Reported on 04/23/2021), Disp: 60 tablet, Rfl: 1   insulin aspart protamine- aspart (NOVOLOG MIX 70/30) (70-30) 100 UNIT/ML injection, Inject 0.15 mLs (15 Units total) into the skin 2 (two) times daily with a meal. (Patient taking differently: Inject 15-17  Units into the skin See admin instructions. Inject 15 units subcutaneous in the morning, then inject 17 units subcutaneous in the evening), Disp: 10 mL, Rfl: 11   Insulin Syringes, Disposable, U-100 1 ML MISC, 1 application by Does not apply route 2 (two) times a day., Disp: 100 each, Rfl: 0   ipratropium-albuterol (DUONEB) 0.5-2.5 (3) MG/3ML SOLN, Take 3 mLs by nebulization every 4 (four) hours as needed., Disp: 360 mL, Rfl: 0   isosorbide-hydrALAZINE (BIDIL) 20-37.5 MG tablet, Take 2 tablets by mouth 3 (three) times daily., Disp: 540 tablet, Rfl: 3   ketorolac (TORADOL) 10 MG tablet, Take 1 tablet (10 mg total) by mouth every 6 (six) hours as needed., Disp: 20 tablet, Rfl: 0   metFORMIN (GLUCOPHAGE) 1000 MG tablet, Take 1,000 mg by mouth 2 (two) times daily., Disp: , Rfl:     oxyCODONE-acetaminophen (PERCOCET) 5-325 MG tablet, Take 1-2 tablets by mouth every 4 (four) hours as needed for severe pain. (Patient not taking: Reported on 04/23/2021), Disp: 30 tablet, Rfl: 0   rosuvastatin (CRESTOR) 40 MG tablet, Take 40 mg by mouth daily., Disp: , Rfl:    vitamin B-12 (CYANOCOBALAMIN) 1000 MCG tablet, Take 1,000 mcg by mouth daily. (Patient not taking: Reported on 04/23/2021), Disp: , Rfl:   Social History   Tobacco Use  Smoking Status Former   Packs/day: 0.50   Years: 30.00   Pack years: 15.00   Types: Cigarettes   Quit date: 2017   Years since quitting: 5.8  Smokeless Tobacco Never    Not on File Objective:  There were no vitals filed for this visit. There is no height or weight on file to calculate BMI. Constitutional Well developed. Well nourished.  Vascular Foot warm and well perfused. Capillary refill normal to all digits.   Neurologic Normal speech. Oriented to person, place, and time. Epicritic sensation to light touch grossly present bilaterally.  Dermatologic Skin completely epithelialized.  Good correction noted.  Range of motion at 90 degrees of the ankle joint.  Wound Location: Right hallux fat layer exposed now probing down to bone with malodor present.  No purulent drainage noted. Peri-wound: Macerated Exudate: Scant/small amount Serosanguinous exudate Wound Measurements: -See below  Orthopedic: No tenderness to palpation noted about the surgical site.   Radiographs: None Assessment:   1. Skin ulcer of right great toe with fat layer exposed ([place])   2. Chronic osteomyelitis of right foot with draining sinus ([facility])   3. Listed for admission to hospital       Plan:  Patient was evaluated and treated and all questions answered.  S/p foot surgery left -Clinically healed and doing well.  Continue physical therapy at this point as needed.  Patient is officially discharged from surgical standpoint.  If any foot and ankle issues arises  from the left side I have asked her to come see me right away.  She states understanding  Ulcer right hallux ulcer with fat layer exposed -I explained to the patient that given that wound has regressed considerably with probing down to bone in setting of a malodor I believe patient will benefit from an amputation. -I discussed with her that we can obtain the MRI as an inpatient.  She is scheduled for an outpatient MRI. -I discussed with her that she would benefit from admission into hospital for IV antibiotics and an amputation as well as obtaining an MRI to assess for osteomyelitis. -Clinically the wound has regressed considerably.  Patient states understanding and will go to  the emergency room to be admitted -Local wound care with Betadine wet-to-dry -Weightbearing as tolerated surgical shoe -She is a high risk of worsening of the infection and losing more than the toe.  I discussed this with the extensive detail she states understand will go to the emergency room as soon as she can.  No follow-ups on file.         No follow-ups on file.

## 2021-05-27 NOTE — Consult Note (Signed)
Subjective:  Patient ID: [name], female    DOB: 11-Jun-1971,  MRN: [id number]   Patient with past medical history of T2DM, HTN, CKD, Diabetic Retinopathy, Lt Thyroid Nodule, Pseudotumor Cerebri,  Stroke, HLD, Asthma, CAD, OSA seen at beside today for right great toe ulcer and osteomyelitis.  Patient was seen by Dr. Posey Pronto yesterday and advised to report to the emergency room for urgent MRI and amputation of the right toe. Patient denies any pain right currently. Relates she is nervous but knows she needs to have the amputation. Denies any nausea, vomiting, fever and chills.   Past Medical History:  Diagnosis Date   Arthritis    Asthma    Cataract    Mixed form OU   CKD (chronic kidney disease)    Coronary artery disease    Diabetes mellitus    Diabetic retinopathy ([place])    NPDR OU   Hyperlipidemia    Hypertension    Hypertensive retinopathy    OU   Left thyroid nodule    diagnosed 07/2018   PAC (premature atrial contraction) 02/15/2021   Pseudotumor cerebri    Stroke ([place])    Vitamin D deficiency      Past Surgical History:  Procedure Laterality Date   ACHILLES TENDON REPAIR     ACHILLES TENDON SURGERY Left 02/19/2021   Procedure: ACHILLES TENDON REPAIR WITH GRAFT;  Surgeon: Felipa Furnace, DPM;  Location: [place];  Service: Podiatry;  Laterality: Left;  BLOCK   GASTROC RECESSION EXTREMITY Left 02/19/2021   Procedure: GASTROC RECESSION EXTREMITY;  Surgeon: Felipa Furnace, DPM;  Location: [place];  Service: Podiatry;  Laterality: Left;  Block   TUBAL LIGATION      CBC Latest Ref Rng & Units 05/27/2021 02/19/2021 06/25/2020  WBC 4.0 - 10.5 K/uL 9.1 - 11.5(H)  Hemoglobin 12.0 - 15.0 g/dL 12.4 12.4 7.7(L)  Hematocrit 36.0 - 46.0 % 37.7 - 26.6(L)  Platelets 150 - 400 K/uL 302 - 249    BMP Latest Ref Rng & Units 05/27/2021 03/12/2021 02/18/2021  Glucose 70 - 99 mg/dL 130(H) 183(H) 104(H)  BUN 6 - 20 mg/dL 17 20 26(H)  Creatinine 0.44 -  1.00 mg/dL 1.76(H) 1.69(H) 1.78(H)  BUN/Creat Ratio 9 - 23 - 12 15  Sodium 135 - 145 mmol/L 141 140 141  Potassium 3.5 - 5.1 mmol/L 3.5 4.5 4.2  Chloride 98 - 111 mmol/L 104 98 102  CO2 22 - 32 mmol/L '26 23 22  '$ Calcium 8.9 - 10.3 mg/dL 8.6(L) 9.2 8.7     Objective:   Vitals:   05/27/21 1615 05/27/21 1630  BP: (!) 146/74 (!) 157/85  Pulse:  (!) 101  Resp:  18  Temp:    SpO2:  99%    General:AA&O x 3. Normal mood and affect   Vascular: DP and PT pulses 2/4 bilateral. Brisk capillary refill to all digits. Pedal hair present   Neruological. Epicritic sensation grossly intact.   Derm: Right hallux wound with exposed fat layer with malodor present. No erythema or edema present. No purulence. Positive probe to bone. Interspaces clears of maceration. Nails well groomed and normal in appearance  MSK: MMT 5/5 in dorsiflexion, plantar flexion, inversion and eversion. Normal joint ROM without pain or crepitus.    Assessment & Plan:  Patient was evaluated and treated and all questions answered.  DX: Right great toe osteomyelitis  Wound care: betadine, DSD  Antibiotics: Per primary (vancomycin)  DME: surgical shoe  Discussed with patient diagnosis and treatment options.  Imaging reviewed. Osseous erosion noted to the distal phalanx of right hallux.  Awaiting results of MRI.  Discussed with patient need for amputation of this toe. Discussed partial vs total amputation of the digit. Will try for partial amputation. Plan for OR tomorrow afternoon.  Please keep NPO after midnight tonight.  Patient in agreement with plan and all questions answered.   Lorenda Peck, MD  Accessible via secure chat for questions or concerns.

## 2021-05-27 NOTE — Progress Notes (Signed)
FMTS Attending Admission Note: Dorris Singh, MD   For questions about this patient, please use [email or web address] to page the family medicine resident on call. Pager number [phone].    I  have seen and examined this patient, reviewed their chart. I have discussed this patient with the resident. Full note to follow.   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 Patient reports she has had wound on R great toe for nearly a year. She has severe neuropathy, caught on door ledge last year. Has had multiple debridements and doing home wound care. Has had worsening odor and now obvious fat layer exposure. Denies chest pain, dyspnea, headache, fevers, nausea or vomiting. Does endorse chills.   PMH- reviewed, has a history of CAD in chart (normal EF, no cath on file here), CVA, DM with multiple complications, last [id number] 6.3, HTN on multiple medications, severe LVH, HFpEF   PSH- Tubal ligation, achilles surgeries  Family history- denies family history of anesthesia complications  Socially- lives alone, from [place], uses cannabis, no tobacco or EtOH, like to crochet   Today's Vitals   05/27/21 1409 05/27/21 1600 05/27/21 1615 05/27/21 1630  BP: (!) 181/102 (!) 152/94 (!) 146/74 (!) 157/85  Pulse: 99 (!) 106  (!) 101  Resp: '16 16  18  '$ Temp: 97.8 F (36.6 C)     TempSrc: Oral     SpO2: 100% 98%  99%  PainSc: 0-No pain      There is no height or weight on file to calculate BMI.  Cardiac: Regular rate and rhythm. Normal S1/S2. No murmurs, rubs, or gallops appreciated. Lungs: Clear bilaterally to ascultation.  Abdomen: Obese, mildly distended  Psych: Pleasant and appropriate  Ext: RLE slightly edematous as compared to left (reports chronic), on plantar aspect of R great toe there is non healing ulcer with exposed fat and  surrounding callus  + evidence of bilateral Achilles repairs  + Thickened nails Right first digit notable for partial avulsion of distal nail/fake nail, no erythema, edema, or purulent drainage    Right great toe osteomyelitis, no symptoms of sepsis at this time. Monitor closely. Agree with antibiotics. Redrawing lactate. Appreciate Podiatry consult and care. Given known CVA and CAD will discuss if vascular evaluation should be undertaken. Gupta score for surgery 0.8% risk of MI in next 30 days, will optimize for surgery. No chest pain with exertion, EKG ordered.  She has only chills in room from influenza--will need to discuss with anesthesia. She is at risk for systemic infection and further loss of tissue if she does not proceed with amputation. Continue vancomycin. If febrile, will culture and add cefepime. Pending cultures in OR, will narrow antibiotics.  Type 2 diabetes, takes 70/30 at home. Recommend starting 10 units of long acting while admitted. Hold home oral medications. Hypertension- hold home ACE. Will continue beta-blockade, amlodipine. No headache, chest pain suggestive of end organ damage.  History of CVA and CAD, given risk of recurrent event, continue aspirin, statin.  Influenza A, given chills and risk factors, recommend treatment. She is at very high risk of severe disease. Oseltamivir 75 mg once then 30 mg BID (will confirm with pharmacy). Will obtain CXR.  Chronic kidney disease IIIB, appears to be near baseline, avoid nephrotoxic agents.  Right lower extremity edema, suspect due to ongoing infection, improves with elevation, will  consider DVT ultrasound pending progression  Will sign resident note as available.

## 2021-05-28 ENCOUNTER — Inpatient Hospital Stay (HOSPITAL_COMMUNITY): Payer: Medicaid Other

## 2021-05-28 ENCOUNTER — Encounter (HOSPITAL_COMMUNITY): Payer: Self-pay | Admitting: Student

## 2021-05-28 ENCOUNTER — Inpatient Hospital Stay (HOSPITAL_COMMUNITY): Payer: Medicaid Other | Admitting: Anesthesiology

## 2021-05-28 ENCOUNTER — Encounter (HOSPITAL_COMMUNITY)
Admission: EM | Disposition: A | Discharge status: Home / Self Care | Payer: Self-pay | Source: Home / Self Care | Attending: Family Medicine

## 2021-05-28 ENCOUNTER — Ambulatory Visit: Payer: Self-pay | Admitting: Podiatry

## 2021-05-28 DIAGNOSIS — N179 Acute kidney failure, unspecified: Secondary | ICD-10-CM | POA: Diagnosis not present

## 2021-05-28 DIAGNOSIS — E1165 Type 2 diabetes mellitus with hyperglycemia: Secondary | ICD-10-CM | POA: Diagnosis present

## 2021-05-28 DIAGNOSIS — I2510 Atherosclerotic heart disease of native coronary artery without angina pectoris: Secondary | ICD-10-CM | POA: Diagnosis present

## 2021-05-28 DIAGNOSIS — Z20822 Contact with and (suspected) exposure to covid-19: Secondary | ICD-10-CM | POA: Diagnosis not present

## 2021-05-28 DIAGNOSIS — H35033 Hypertensive retinopathy, bilateral: Secondary | ICD-10-CM | POA: Diagnosis not present

## 2021-05-28 DIAGNOSIS — E559 Vitamin D deficiency, unspecified: Secondary | ICD-10-CM | POA: Diagnosis present

## 2021-05-28 DIAGNOSIS — M86471 Chronic osteomyelitis with draining sinus, right ankle and foot: Secondary | ICD-10-CM | POA: Diagnosis not present

## 2021-05-28 DIAGNOSIS — E782 Mixed hyperlipidemia: Secondary | ICD-10-CM | POA: Diagnosis present

## 2021-05-28 DIAGNOSIS — E1169 Type 2 diabetes mellitus with other specified complication: Secondary | ICD-10-CM | POA: Diagnosis not present

## 2021-05-28 DIAGNOSIS — E11621 Type 2 diabetes mellitus with foot ulcer: Secondary | ICD-10-CM | POA: Diagnosis not present

## 2021-05-28 DIAGNOSIS — Z7982 Long term (current) use of aspirin: Secondary | ICD-10-CM | POA: Diagnosis not present

## 2021-05-28 DIAGNOSIS — J45909 Unspecified asthma, uncomplicated: Secondary | ICD-10-CM | POA: Diagnosis present

## 2021-05-28 DIAGNOSIS — M86171 Other acute osteomyelitis, right ankle and foot: Secondary | ICD-10-CM | POA: Diagnosis not present

## 2021-05-28 DIAGNOSIS — I10 Essential (primary) hypertension: Secondary | ICD-10-CM | POA: Diagnosis not present

## 2021-05-28 DIAGNOSIS — E113293 Type 2 diabetes mellitus with mild nonproliferative diabetic retinopathy without macular edema, bilateral: Secondary | ICD-10-CM | POA: Diagnosis not present

## 2021-05-28 DIAGNOSIS — E876 Hypokalemia: Secondary | ICD-10-CM | POA: Diagnosis not present

## 2021-05-28 DIAGNOSIS — J101 Influenza due to other identified influenza virus with other respiratory manifestations: Secondary | ICD-10-CM | POA: Diagnosis not present

## 2021-05-28 DIAGNOSIS — E1140 Type 2 diabetes mellitus with diabetic neuropathy, unspecified: Secondary | ICD-10-CM | POA: Diagnosis not present

## 2021-05-28 DIAGNOSIS — R197 Diarrhea, unspecified: Secondary | ICD-10-CM | POA: Diagnosis present

## 2021-05-28 DIAGNOSIS — I130 Hypertensive heart and chronic kidney disease with heart failure and stage 1 through stage 4 chronic kidney disease, or unspecified chronic kidney disease: Secondary | ICD-10-CM | POA: Diagnosis not present

## 2021-05-28 DIAGNOSIS — M1990 Unspecified osteoarthritis, unspecified site: Secondary | ICD-10-CM | POA: Diagnosis present

## 2021-05-28 DIAGNOSIS — E1122 Type 2 diabetes mellitus with diabetic chronic kidney disease: Secondary | ICD-10-CM | POA: Diagnosis not present

## 2021-05-28 DIAGNOSIS — E08621 Diabetes mellitus due to underlying condition with foot ulcer: Secondary | ICD-10-CM | POA: Diagnosis not present

## 2021-05-28 DIAGNOSIS — I5032 Chronic diastolic (congestive) heart failure: Secondary | ICD-10-CM | POA: Diagnosis not present

## 2021-05-28 DIAGNOSIS — G4733 Obstructive sleep apnea (adult) (pediatric): Secondary | ICD-10-CM | POA: Diagnosis present

## 2021-05-28 DIAGNOSIS — L97116 Non-pressure chronic ulcer of right thigh with bone involvement without evidence of necrosis: Secondary | ICD-10-CM | POA: Diagnosis present

## 2021-05-28 DIAGNOSIS — N1832 Chronic kidney disease, stage 3b: Secondary | ICD-10-CM | POA: Diagnosis not present

## 2021-05-28 HISTORY — PX: AMPUTATION TOE: SHX6595

## 2021-05-28 LAB — SURGICAL PCR SCREEN
MRSA, PCR: NEGATIVE
Staphylococcus aureus: POSITIVE — AB

## 2021-05-28 LAB — BASIC METABOLIC PANEL
Anion gap: 9 (ref 5–15)
Anion gap: 9 (ref 5–15)
BUN: 23 mg/dL — ABNORMAL HIGH (ref 6–20)
BUN: 19 mg/dL (ref 6–20)
CO2: 24 mmol/L (ref 22–32)
CO2: 23 mmol/L (ref 22–32)
Calcium: 7.9 mg/dL — ABNORMAL LOW (ref 8.9–10.3)
Calcium: 8.3 mg/dL — ABNORMAL LOW (ref 8.9–10.3)
Chloride: 102 mmol/L (ref 98–111)
Chloride: 104 mmol/L (ref 98–111)
Creatinine, Ser: 1.96 mg/dL — ABNORMAL HIGH (ref 0.44–1.00)
Creatinine, Ser: 1.7 mg/dL — ABNORMAL HIGH (ref 0.44–1.00)
GFR, Estimated: 31 mL/min — ABNORMAL LOW (ref >60)
GFR, Estimated: 37 mL/min — ABNORMAL LOW (ref >60)
Glucose, Bld: 196 mg/dL — ABNORMAL HIGH (ref 70–99)
Glucose, Bld: 120 mg/dL — ABNORMAL HIGH (ref 70–99)
Potassium: 2.9 mmol/L — ABNORMAL LOW (ref 3.5–5.1)
Potassium: 3.5 mmol/L (ref 3.5–5.1)
Sodium: 135 mmol/L (ref 135–145)
Sodium: 136 mmol/L (ref 135–145)

## 2021-05-28 LAB — CBC
HCT: 31.2 % — ABNORMAL LOW (ref 36.0–46.0)
Hemoglobin: 10.5 g/dL — ABNORMAL LOW (ref 12.0–15.0)
MCH: 30.4 pg (ref 26.0–34.0)
MCHC: 33.7 g/dL (ref 30.0–36.0)
MCV: 90.4 fL (ref 80.0–100.0)
Platelets: 237 10*3/uL (ref 150–400)
RBC: 3.45 MIL/uL — ABNORMAL LOW (ref 3.87–5.11)
RDW: 14 % (ref 11.5–15.5)
WBC: 9.3 10*3/uL (ref 4.0–10.5)
nRBC: 0 % (ref 0.0–0.2)

## 2021-05-28 LAB — GLUCOSE, CAPILLARY
Glucose-Capillary: 165 mg/dL — ABNORMAL HIGH (ref 70–99)
Glucose-Capillary: 129 mg/dL — ABNORMAL HIGH (ref 70–99)
Glucose-Capillary: 122 mg/dL — ABNORMAL HIGH (ref 70–99)
Glucose-Capillary: 189 mg/dL — ABNORMAL HIGH (ref 70–99)
Glucose-Capillary: 297 mg/dL — ABNORMAL HIGH (ref 70–99)

## 2021-05-28 LAB — HIV ANTIBODY (ROUTINE TESTING W REFLEX): HIV Screen 4th Generation wRfx: NONREACTIVE

## 2021-05-28 LAB — HEMOGLOBIN A1C
Hgb A1c MFr Bld: 8.7 % — ABNORMAL HIGH (ref 4.8–5.6)
Mean Plasma Glucose: 202.99 mg/dL

## 2021-05-28 SURGERY — AMPUTATION, TOE
Anesthesia: Monitor Anesthesia Care | Site: Toe | Laterality: Right

## 2021-05-28 MED ORDER — MUPIROCIN 2 % EX OINT
1.0000 "application " | TOPICAL_OINTMENT | Freq: Two times a day (BID) | CUTANEOUS | Status: DC
  Administered 2021-05-28: 1 via NASAL
  Filled 2021-05-28 (×3): qty 22

## 2021-05-28 MED ORDER — PROPOFOL 10 MG/ML IV BOLUS
INTRAVENOUS | Status: DC | PRN
  Administered 2021-05-28: 10 mg via INTRAVENOUS
  Administered 2021-05-28: 20 mg via INTRAVENOUS

## 2021-05-28 MED ORDER — FENTANYL CITRATE (PF) 250 MCG/5ML IJ SOLN
INTRAMUSCULAR | Status: AC
  Filled 2021-05-28: qty 5

## 2021-05-28 MED ORDER — CHLORHEXIDINE GLUCONATE 0.12 % MT SOLN
OROMUCOSAL | Status: AC
  Administered 2021-05-28: 15 mL via OROMUCOSAL
  Filled 2021-05-28: qty 15

## 2021-05-28 MED ORDER — MIDAZOLAM HCL 2 MG/2ML IJ SOLN
INTRAMUSCULAR | Status: AC
  Filled 2021-05-28: qty 2

## 2021-05-28 MED ORDER — LACTATED RINGERS IV SOLN: INTRAVENOUS | Status: DC

## 2021-05-28 MED ORDER — PROPOFOL 500 MG/50ML IV EMUL
INTRAVENOUS | Status: DC | PRN
  Administered 2021-05-28: 75 ug/kg/min via INTRAVENOUS

## 2021-05-28 MED ORDER — LIDOCAINE HCL 2 % IJ SOLN
INTRAMUSCULAR | Status: DC | PRN
  Administered 2021-05-28: 5 mL

## 2021-05-28 MED ORDER — 0.9 % SODIUM CHLORIDE (POUR BTL) OPTIME
TOPICAL | Status: DC | PRN
  Administered 2021-05-28: 1000 mL

## 2021-05-28 MED ORDER — FENTANYL CITRATE (PF) 250 MCG/5ML IJ SOLN
INTRAMUSCULAR | Status: DC | PRN
  Administered 2021-05-28: 50 ug via INTRAVENOUS

## 2021-05-28 MED ORDER — CHLORHEXIDINE GLUCONATE 0.12 % MT SOLN: 15.0000 mL | Freq: Once | OROMUCOSAL | Status: AC

## 2021-05-28 MED ORDER — POTASSIUM CHLORIDE CRYS ER 20 MEQ PO TBCR
40.0000 meq | EXTENDED_RELEASE_TABLET | ORAL | Status: AC
  Administered 2021-05-28 (×2): 40 meq via ORAL
  Filled 2021-05-28 (×2): qty 2

## 2021-05-28 MED ORDER — CHLORHEXIDINE GLUCONATE CLOTH 2 % EX PADS
6.0000 | MEDICATED_PAD | Freq: Every day | CUTANEOUS | Status: DC
Start: 2021-05-28 — End: 2021-05-30
  Administered 2021-05-28 – 2021-05-29 (×2): 6 via TOPICAL

## 2021-05-28 MED ORDER — LACTATED RINGERS IV SOLN: INTRAVENOUS | Status: AC

## 2021-05-28 MED ORDER — LIDOCAINE HCL (PF) 2 % IJ SOLN
INTRAMUSCULAR | Status: AC
  Filled 2021-05-28: qty 10

## 2021-05-28 MED ORDER — MIDAZOLAM HCL 5 MG/5ML IJ SOLN
INTRAMUSCULAR | Status: DC | PRN
  Administered 2021-05-28: 2 mg via INTRAVENOUS

## 2021-05-28 MED ORDER — ONDANSETRON HCL 4 MG/2ML IJ SOLN
INTRAMUSCULAR | Status: DC | PRN
  Administered 2021-05-28: 4 mg via INTRAVENOUS

## 2021-05-28 MED ORDER — BUPIVACAINE HCL (PF) 0.25 % IJ SOLN
INTRAMUSCULAR | Status: AC
  Filled 2021-05-28: qty 10

## 2021-05-28 MED ORDER — BUPIVACAINE HCL 0.5 % IJ SOLN
INTRAMUSCULAR | Status: DC | PRN
  Administered 2021-05-28: 5 mL

## 2021-05-28 MED ORDER — ORAL CARE MOUTH RINSE: 15.0000 mL | Freq: Once | OROMUCOSAL | Status: AC

## 2021-05-28 SURGICAL SUPPLY — 30 items
BAG COUNTER SPONGE SURGICOUNT (BAG) ×2 IMPLANT
BAG SPNG CNTER NS LX DISP (BAG) ×1
BLADE LONG MED 31X9 (MISCELLANEOUS) ×2 IMPLANT
BNDG ELASTIC 4X5.8 VLCR STR LF (GAUZE/BANDAGES/DRESSINGS) ×2 IMPLANT
BNDG GAUZE ELAST 4 BULKY (GAUZE/BANDAGES/DRESSINGS) ×2 IMPLANT
COVER SURGICAL LIGHT HANDLE (MISCELLANEOUS) ×2 IMPLANT
DRSG EMULSION OIL 3X3 NADH (GAUZE/BANDAGES/DRESSINGS) ×2 IMPLANT
ELECT REM PT RETURN 9FT ADLT (ELECTROSURGICAL) ×2
ELECTRODE REM PT RTRN 9FT ADLT (ELECTROSURGICAL) ×1 IMPLANT
GAUZE SPONGE 4X4 12PLY STRL (GAUZE/BANDAGES/DRESSINGS) ×2 IMPLANT
GLOVE SRG 8 PF TXTR STRL LF DI (GLOVE) ×1 IMPLANT
GLOVE SURG ENC MOIS LTX SZ7.5 (GLOVE) ×2 IMPLANT
GLOVE SURG UNDER POLY LF SZ8 (GLOVE) ×2
GOWN STRL REUS W/ TWL LRG LVL3 (GOWN DISPOSABLE) ×1 IMPLANT
GOWN STRL REUS W/ TWL XL LVL3 (GOWN DISPOSABLE) ×1 IMPLANT
GOWN STRL REUS W/TWL LRG LVL3 (GOWN DISPOSABLE) ×2
GOWN STRL REUS W/TWL XL LVL3 (GOWN DISPOSABLE) ×2
KIT BASIN OR (CUSTOM PROCEDURE TRAY) ×2 IMPLANT
KIT TURNOVER KIT B (KITS) ×2 IMPLANT
NDL HYPO 25GX1X1/2 BEV (NEEDLE) ×1 IMPLANT
NEEDLE HYPO 25GX1X1/2 BEV (NEEDLE) ×2 IMPLANT
NS IRRIG 1000ML POUR BTL (IV SOLUTION) ×2 IMPLANT
PACK ORTHO EXTREMITY (CUSTOM PROCEDURE TRAY) ×2 IMPLANT
PAD ARMBOARD 7.5X6 YLW CONV (MISCELLANEOUS) ×2 IMPLANT
SOL PREP POV-IOD 4OZ 10% (MISCELLANEOUS) ×2 IMPLANT
SUT ETHILON 3 0 PS 1 (SUTURE) ×3 IMPLANT
SYR CONTROL 10ML LL (SYRINGE) ×2 IMPLANT
TOWEL GREEN STERILE (TOWEL DISPOSABLE) ×2 IMPLANT
TUBE CONNECTING 12X1/4 (SUCTIONS) ×2 IMPLANT
YANKAUER SUCT BULB TIP NO VENT (SUCTIONS) ×2 IMPLANT

## 2021-05-28 NOTE — Plan of Care (Signed)

## 2021-05-28 NOTE — Progress Notes (Signed)
FPTS Brief Progress Note  S:Patient was asleep upon arrival. She woke up and said she is feeling well and pain is well controlled. She did endorse some numbness on her right foot.    O: BP (!) 163/71 (BP Location: Right Arm)   Pulse 68   Temp 97.7 F (36.5 C) (Oral)   Resp 18   Ht 5\' 9"  (1.753 m)   Wt 109 kg   LMP 05/13/2021 (Approximate)   SpO2 100%   BMI 35.49 kg/m   General: Awake, well appearing, NAD Respiratory: On BIPAP Extremities: Right foot is wrapped with surgical dressing. Dresssing stained with bright red blood. Able to move light toes freely. Muscle strength 5/5 on LE   A/P: -Ordered SCDs for VTE prophylaxis. Need clarification for Podiatry for pharmacological VTE ppx  - [name] pain is well controlled. Suspected surgical nerve block as cause of her numbness.  -Orders reviewed. Labs for AM ordered, which was adjusted as needed.    Alen Bleacher, MD 05/28/2021, 10:29 PM PGY-1, [place] [facility] Night Resident  Please page [phone] with questions.

## 2021-05-28 NOTE — Progress Notes (Signed)
PT Cancellation Note  Patient Details Name: [name] MRN: [id number] DOB: 09/26/1970   Cancelled Treatment:    Reason Eval/Treat Not Completed: Patient not medically ready (Pt. to OR today for R great toe amputation.  PT to evaluate movement after procedure.)  Toryn Dewalt A. Elieser Tetrick, PT, DPT Acute Rehabilitation Services Office: [place] 05/28/2021, 8:40 AM

## 2021-05-28 NOTE — H&P (Signed)
History and Physical Interval Note:  05/28/2021 3:12 PM  [name]  has presented today for surgery, with the diagnosis of right hallux osteomyelitis.  The various methods of treatment have been discussed with the patient and family. After consideration of risks, benefits and other options for treatment, the patient has consented to   Right partial hallux amputation as a surgical intervention.  The patient's history has been reviewed, patient examined, no change in status, stable for surgery.  I have reviewed the patient's chart and labs.  Questions were answered to the patient's satisfaction.     Lorenda Peck

## 2021-05-28 NOTE — Progress Notes (Signed)
Pt c/o episodes of dizziness x 3 since admission. Michela Pitcher that it is not new for her. On call Md made aware. Orthostatics done

## 2021-05-28 NOTE — Plan of Care (Signed)
  Problem: Education: Goal: Knowledge of General Education information will improve Description Including pain rating scale, medication(s)/side effects and non-pharmacologic comfort measures Outcome: Progressing   Problem: Health Behavior/Discharge Planning: Goal: Ability to manage health-related needs will improve Outcome: Progressing   

## 2021-05-28 NOTE — Transfer of Care (Signed)
Immediate Anesthesia Transfer of Care Note  Patient: [name]  Procedure(s) Performed: AMPUTATION TOE (Right: Toe)  Patient Location: PACU  Anesthesia Type:MAC  Level of Consciousness: awake, alert  and oriented  Airway & Oxygen Therapy: Patient Spontanous Breathing  Post-op Assessment: Report given to RN and Post -op Vital signs reviewed and stable  Post vital signs: Reviewed and stable  Last Vitals:  Vitals Value Taken Time  BP 170/86 05/28/21 1620  Temp    Pulse 82 05/28/21 1620  Resp 17 05/28/21 1620  SpO2 100 % 05/28/21 1620  Vitals shown include unvalidated device data.  Last Pain:  Vitals:   05/28/21 1514  TempSrc: Oral  PainSc: 0-No pain      Patients Stated Pain Goal: 3 (80/22/33 6122)  Complications: No notable events documented.

## 2021-05-28 NOTE — Op Note (Addendum)
   OPERATIVE REPORT Patient name: [name] MRN: [id number] DOB: [birth year]  DOS:  05/28/21  Preop Dx: Right hallux osteomyelitis  Postop Dx: same  Procedure:  1. Right partial hallux amputation   Surgeon: Lorenda Peck, DPM  Anesthesia: 50-50 mixture of 2% lidocaine plain with 0.5% Marcaine plain totaling 10 infiltrated in the patient's right lower extremity  Hemostasis: Ankle tourniquet inflated to a pressure of 257mmHg after esmarch exsanguination   EBL: 5 mL  Pathology: right great toe   Condition: The patient tolerated the procedure and anesthesia well. No complications noted or reported   Justification for procedure: The patient is a 50 y.o. female who presents today for partial amputation of right great toe. All conservative modalities of been unsuccessful in providing any sort of satisfactory alleviation of symptoms with the patient. The patient was told benefits as well as possible side effects of the surgery. The patient consented for surgical correction. The patient consent form was reviewed. All patient questions were answered. No guarantees were expressed or implied. The patient and the surgeon both signed the patient consent form with the witness present and placed in the patient's chart.   Procedure in Detail: The patient was brought to the operating room, placed in the operating table in the supine position at which time an aseptic scrub and drape were performed about the patient's respective lower extremity after anesthesia was induced as described above. Attention was then directed to the surgical area where procedure number one commenced.  Procedure #1:   Attention was directed to the right hallux digit where an incision was preformed encircling the proximal interphalangeal joint Incision was made down to bone and digit was amputated at the level of the interphalangeal joint. After the toe was disarticulated it was passed off to the back table to be sent to  pathology for further evaluation. The extensor and flexor tendons were identified and resected as far proximally as possible. Any necrotic tissue was removed to healthy bleeding tissue. The metatarsal head was identified and noted to be hard. The area was irrigated copiously sterile saline and residual bone was sent to microbiology. Any bleeders noted were cauterized as necessary. The skin was re-approximated utilizing 3-0 nylon suture.    Dry sterile compressive dressings were then applied to all previously mentioned incision sites about the patient's lower extremity. The tourniquet which was used for hemostasis was deflated. All normal neurovascular responses including pink color and warmth returned all the digits of patient's lower extremity.  The patient was then transferred from the operating room to the recovery room having tolerated the procedure and anesthesia well. All vital signs are stable. After a brief stay in the recovery room the patient was transferred back to the floor. with adequate prescriptions for analgesia.   Lorenda Peck, DPM [facility]  Dr. Lorenda Peck, DPM    [address] [place], [place] [address]                Office [phone]  Fax [phone]

## 2021-05-28 NOTE — Progress Notes (Signed)
OT Cancellation Note  Patient Details Name: [name] MRN: [id number] DOB: 18-Feb-1971   Cancelled Treatment:    Reason Eval/Treat Not Completed: Other (comment);Patient not medically ready ((Pt. to OR today for R great toe amputation.) Plan to re-attempt post surgery  Tyrone Schimke, [place] Pager: [phone] Office: [phone]  05/28/2021, 12:03 PM

## 2021-05-28 NOTE — Anesthesia Postprocedure Evaluation (Signed)
Anesthesia Post Note  Patient: [name]  Procedure(s) Performed: AMPUTATION TOE (Right: Toe)     Patient location during evaluation: PACU Anesthesia Type: MAC Level of consciousness: awake and alert Pain management: pain level controlled Vital Signs Assessment: post-procedure vital signs reviewed and stable Respiratory status: spontaneous breathing and respiratory function stable Cardiovascular status: stable Postop Assessment: no apparent nausea or vomiting Anesthetic complications: no   No notable events documented.  Last Vitals:  Vitals:   05/28/21 1621 05/28/21 1635  BP:  (!) 173/90  Pulse:  73  Resp:  18  Temp: (!) 36.1 C 36.7 C  SpO2:  100%    Last Pain:  Vitals:   05/28/21 1635  TempSrc:   PainSc: 0-No pain                 Antoria Lanza DANIEL

## 2021-05-28 NOTE — Anesthesia Preprocedure Evaluation (Addendum)
Anesthesia Evaluation  Patient identified by MRN, date of birth, ID band Patient awake    Reviewed: Allergy & Precautions, NPO status , [name] Chart, lab work & pertinent test results  History of Anesthesia Complications Negative for: history of anesthetic complications  Airway Mallampati: II  TM Distance: >3 FB Neck ROM: Full    Dental  (+) Chipped, Dental Advisory Given,    Pulmonary asthma , sleep apnea , former smoker,    Pulmonary exam normal        Cardiovascular hypertension, Pt. on medications and Pt. on home beta blockers + CAD  Normal cardiovascular exam  EKG: SR w/ PAC's  Echo:  1. Left ventricular ejection fraction, by estimation, is 60 to 65%. The  left ventricle has normal function. The left ventricle has no regional  wall motion abnormalities. There is severe left ventricular hypertrophy.  Left ventricular diastolic parameters  were normal.  2. Right ventricular systolic function is normal. The right ventricular  size is normal. There is normal pulmonary artery systolic pressure. The  estimated right ventricular systolic pressure is 94.8 mmHg.  3. A small pericardial effusion is present.  4. The mitral valve is normal in structure. No evidence of mitral valve  regurgitation.  5. The aortic valve was not well visualized. Aortic valve regurgitation  is mild. No aortic stenosis is present.  6. The inferior vena cava is dilated in size with >50% respiratory  variability, suggesting right atrial pressure of 8 mmHg.    Neuro/Psych CVA    GI/Hepatic   Endo/Other  diabetes, Type 2, Insulin Dependent, Oral Hypoglycemic Agents  Renal/GU Renal Insufficiency and CRFRenal disease     Musculoskeletal   Abdominal   Peds  Hematology  (+) anemia ,   Anesthesia Other Findings   Reproductive/Obstetrics                            Anesthesia Physical  Anesthesia Plan  ASA:  3  Anesthesia Plan: MAC   Post-op Pain Management:    Induction: Intravenous  PONV Risk Score and Plan: 3 and Ondansetron, Dexamethasone and Midazolam  Airway Management Planned: LMA  Additional Equipment: None  Intra-op Plan:   Post-operative Plan:   Informed Consent: I have reviewed the patients History and Physical, chart, labs and discussed the procedure including the risks, benefits and alternatives for the proposed anesthesia with the patient or authorized representative who has indicated his/her understanding and acceptance.     Dental advisory given  Plan Discussed with: Anesthesiologist, CRNA and Surgeon  Anesthesia Plan Comments:        Anesthesia Quick Evaluation

## 2021-05-28 NOTE — Progress Notes (Signed)
Family Medicine Teaching Service Daily Progress Note Intern Pager: [phone]  Patient name: [name] Medical record number: [id number] Date of birth: 05/22/1971 Age: 50 y.o. Gender: female  Primary Care Provider: [name], MD Consultants: Podiatry Code Status: Full  Pt Overview and Major Events to Date:  05/28/21-Admitted  Assessment and Plan: [name] is a 50 y.o. female who presented with right toe osteomyelitis. PMH significant for T2DM, HTN, CKD, diabetic retinopathy, pseudotumor cerebri, CVA, HLD, CAD, HFpEF, OSA  Osteomyelitis of right great toe Ulcer present for almost 1 yr. Send over by podiatrist, Dr. Posey [name] for IV abx, MRI, and amputation. MRI last night confirmed osteomyelitis, plantar soft tissue ulceration and soft tissue edema of great right toe. Blood cx ordered although pt not meeting SIRS criteria. Lactic acid tended down yesterday from 2.5>1.8. Pt remained afebrile overnight with CBC of 9.3 this morning.  -Podiatry consulted -partial vs full amputation of great right toe this afternoon  -continue IV vancomycin  -NPO today before sx -f/u blood cx -Continuous cardiac monitoring  -OT/PT eval and treat  -fall precautions -incentive spirometry  Hypokalemia K of 2.9 this morning. This has been repleted with oral potassium chloride 40 mEq x2 -afternoon BMP  Flu A+ Patient had incidental finding of testing positive for flu A.  Is asymptomatic.  Patient had chest x-ray yesterday which showed no acute cardiopulmonary process. -Tamiflu 30 mg twice daily for 5 days monitor respiratory status -Droplet precautions  DM type II Home medications include NovoLog 70/30-15 units in a.m. and 17 units in p.m., glipizide 10 mg daily, metformin 1000 mg twice daily.  Glucose today is 196. HgA1c yesterday of 8.7 -Holding home medications -Moderate SSI -Semglee 10 units daily -CBG monitoring 4 times daily before meals and at bedtime  AKI on CKD stage IIIb GFR  is 31.  Baseline creatinine appears to be around 1.5-1.6.  Creatinine is up today to 1.96 possibly due to IV vancomycin. - LR 100 mL/h for 5 hours  Hypertension Home medications include Lotrel 10-40 mg, and carvedilol 25 mg twice daily.  Holding Lo-Trol as it contains benazepril and patient has possible AKI. BP today of 155/78 -Amlodipine 10 mg daily -Carvedilol 25 mg twice daily  HLD -Continue home rosuvastatin 40 mg daily  CAD  CVA Patient had stroke 2 years ago, recovered with no deficits. -Continue home ASA 81 mg daily -Continue home rosuvastatin 40 mg daily  HFpEF Echo from 11/2019 shows EF of 6-65% and severe LVH.  All medications include isosorbide-hydralazine 20/37.5 mg 2 tablets 3 times daily, furosemide 40 mg twice daily. -Holding home medications  OSA Patient use CPAP at home -CPAP at night  FEN/GI: NPO PPx: Heparin Dispo:Home tomorrow. Barriers include surgery and post op care.   Subjective:  Patient states she overall feels well this morning.  States she did have another dizzy spell this morning when she got up to go to the bathroom but it has resolved.  She is currently seeing her PCP regarding these dizzy spells, was supposed to have an appointment yesterday but was unable to make it due to coming to the hospital.  States she had some diarrhea this morning but that she commonly gets "bubble guts" when she is nervous. States she is nervous about her surgery this afternoon.   Objective: Temp:  [97.8 F (36.6 C)-98.4 F (36.9 C)] 98.4 F (36.9 C) (10/21 0451) Pulse Rate:  [75-106] 75 (10/21 0451) Resp:  [16-18] 18 (10/21 0451) BP: (145-181)/(54-102) 155/78 (10/21 0451) SpO2:  [  98 %-100 %] 98 % (10/21 0451) FiO2 (%):  [21 %] 21 % (10/20 2100) Weight:  [108.9 kg-109 kg] 109 kg (10/20 2100) Physical Exam: General: Patient lying in bed, pleasant, NAD Cardiovascular: RRR Respiratory: CTAB Extremities: No edema of BLEs, Band-Aid covering ulcer of great right  toe  Laboratory: Recent Labs  Lab 05/27/21 1458 05/28/21 0219  WBC 9.1 9.3  HGB 12.4 10.5*  HCT 37.7 31.2*  PLT 302 237   Recent Labs  Lab 05/27/21 1458 05/28/21 0219  NA 141 135  K 3.5 2.9*  CL 104 102  CO2 26 24  BUN 17 23*  CREATININE 1.76* 1.96*  CALCIUM 8.6* 7.9*  PROT 7.1  --   BILITOT 0.5  --   ALKPHOS 63  --   ALT 12  --   AST 14*  --   GLUCOSE 130* 196Precious Gilding, DO 05/28/2021, 7:32 AM PGY-1, [place] Intern pager: [phone], text pages welcome

## 2021-05-28 NOTE — Brief Op Note (Signed)
05/28/2021  4:16 PM  PATIENT:  [name]  50 y.o. female  PRE-OPERATIVE DIAGNOSIS:  Right hallux osteomyelitis  POST-OPERATIVE DIAGNOSIS:  Right hallux osteomyelitis  PROCEDURE:  Procedure(s) with comments: AMPUTATION TOE (Right) - Surgical team will do block  SURGEON:  Surgeon(s) and Role:    * Lorenda Peck, MD - Primary  PHYSICIAN ASSISTANT:   ASSISTANTS: none   ANESTHESIA:   MAC  EBL:  5 mL   BLOOD ADMINISTERED:none  DRAINS: none   LOCAL MEDICATIONS USED:  MARCAINE   , LIDOCAINE , and Amount: 10 ml  SPECIMEN:  Source of Specimen:  Right great toe   DISPOSITION OF SPECIMEN:  PATHOLOGY  COUNTS:  YES  TOURNIQUET:  * No tourniquets in log *  DICTATION: .Note written in EPIC  PLAN OF CARE: Admit to inpatient   PATIENT DISPOSITION:  PACU - hemodynamically stable.   Delay start of Pharmacological VTE agent (>24hrs) due to surgical blood loss or risk of bleeding: not applicable

## 2021-05-29 ENCOUNTER — Encounter (HOSPITAL_COMMUNITY): Payer: Self-pay | Admitting: Podiatry

## 2021-05-29 DIAGNOSIS — L089 Local infection of the skin and subcutaneous tissue, unspecified: Secondary | ICD-10-CM

## 2021-05-29 DIAGNOSIS — I10 Essential (primary) hypertension: Secondary | ICD-10-CM | POA: Diagnosis not present

## 2021-05-29 DIAGNOSIS — J101 Influenza due to other identified influenza virus with other respiratory manifestations: Secondary | ICD-10-CM | POA: Diagnosis not present

## 2021-05-29 DIAGNOSIS — E11621 Type 2 diabetes mellitus with foot ulcer: Secondary | ICD-10-CM | POA: Diagnosis not present

## 2021-05-29 DIAGNOSIS — M86471 Chronic osteomyelitis with draining sinus, right ankle and foot: Secondary | ICD-10-CM | POA: Diagnosis not present

## 2021-05-29 LAB — CBC
HCT: 31.6 % — ABNORMAL LOW (ref 36.0–46.0)
Hemoglobin: 10.8 g/dL — ABNORMAL LOW (ref 12.0–15.0)
MCH: 30.3 pg (ref 26.0–34.0)
MCHC: 34.2 g/dL (ref 30.0–36.0)
MCV: 88.8 fL (ref 80.0–100.0)
Platelets: 235 10*3/uL (ref 150–400)
RBC: 3.56 MIL/uL — ABNORMAL LOW (ref 3.87–5.11)
RDW: 13.6 % (ref 11.5–15.5)
WBC: 6 10*3/uL (ref 4.0–10.5)
nRBC: 0 % (ref 0.0–0.2)

## 2021-05-29 LAB — BASIC METABOLIC PANEL
Anion gap: 8 (ref 5–15)
BUN: 20 mg/dL (ref 6–20)
CO2: 26 mmol/L (ref 22–32)
Calcium: 7.9 mg/dL — ABNORMAL LOW (ref 8.9–10.3)
Chloride: 103 mmol/L (ref 98–111)
Creatinine, Ser: 2.01 mg/dL — ABNORMAL HIGH (ref 0.44–1.00)
GFR, Estimated: 30 mL/min — ABNORMAL LOW (ref >60)
Glucose, Bld: 195 mg/dL — ABNORMAL HIGH (ref 70–99)
Potassium: 3.3 mmol/L — ABNORMAL LOW (ref 3.5–5.1)
Sodium: 137 mmol/L (ref 135–145)

## 2021-05-29 LAB — GLUCOSE, CAPILLARY
Glucose-Capillary: 214 mg/dL — ABNORMAL HIGH (ref 70–99)
Glucose-Capillary: 181 mg/dL — ABNORMAL HIGH (ref 70–99)
Glucose-Capillary: 125 mg/dL — ABNORMAL HIGH (ref 70–99)
Glucose-Capillary: 136 mg/dL — ABNORMAL HIGH (ref 70–99)

## 2021-05-29 MED ORDER — LACTATED RINGERS IV SOLN: INTRAVENOUS | Status: AC

## 2021-05-29 MED ORDER — ACETAMINOPHEN 325 MG PO TABS
650.0000 mg | ORAL_TABLET | Freq: Four times a day (QID) | ORAL | Status: DC | PRN
  Administered 2021-05-29: 650 mg via ORAL
  Filled 2021-05-29: qty 2

## 2021-05-29 MED ORDER — OXYCODONE HCL 5 MG PO TABS
5.0000 mg | ORAL_TABLET | Freq: Four times a day (QID) | ORAL | Status: DC | PRN
  Administered 2021-05-29: 5 mg via ORAL
  Filled 2021-05-29: qty 1

## 2021-05-29 MED ORDER — ISOSORB DINITRATE-HYDRALAZINE 20-37.5 MG PO TABS
2.0000 | ORAL_TABLET | Freq: Three times a day (TID) | ORAL | Status: DC
  Administered 2021-05-29 – 2021-05-30 (×4): 2 via ORAL
  Filled 2021-05-29 (×6): qty 2

## 2021-05-29 MED ORDER — ACETAMINOPHEN 325 MG PO TABS
650.0000 mg | ORAL_TABLET | Freq: Four times a day (QID) | ORAL | Status: DC
  Administered 2021-05-30 (×3): 650 mg via ORAL
  Filled 2021-05-29 (×3): qty 2

## 2021-05-29 MED ORDER — ENOXAPARIN SODIUM 40 MG/0.4ML IJ SOSY
40.0000 mg | PREFILLED_SYRINGE | INTRAMUSCULAR | Status: DC
  Administered 2021-05-29 – 2021-05-30 (×2): 40 mg via SUBCUTANEOUS
  Filled 2021-05-29 (×2): qty 0.4

## 2021-05-29 MED ORDER — OXYCODONE HCL 5 MG PO TABS
5.0000 mg | ORAL_TABLET | ORAL | Status: DC | PRN
  Administered 2021-05-29 – 2021-05-30 (×4): 5 mg via ORAL
  Filled 2021-05-29 (×4): qty 1

## 2021-05-29 MED ORDER — POTASSIUM CHLORIDE CRYS ER 20 MEQ PO TBCR
40.0000 meq | EXTENDED_RELEASE_TABLET | Freq: Once | ORAL | Status: AC
  Administered 2021-05-29: 40 meq via ORAL
  Filled 2021-05-29: qty 2

## 2021-05-29 MED ORDER — OXYCODONE HCL 5 MG PO TABS
5.0000 mg | ORAL_TABLET | Freq: Once | ORAL | Status: AC
  Administered 2021-05-29: 5 mg via ORAL
  Filled 2021-05-29: qty 1

## 2021-05-29 NOTE — Progress Notes (Addendum)
FPTS Brief Progress Note  S: Sitting in bed watching tv. Plans to call nurse to go use the bathroom and go to bed after.    O: BP 128/79 (BP Location: Right Arm)   Pulse 73   Temp 98.3 F (36.8 C) (Oral)   Resp 18   Ht 5\' 9"  (1.753 m)   Wt 109 kg   LMP 05/13/2021 (Approximate)   SpO2 98%   BMI 35.49 kg/m   General: Appears well, no acute distress. Age appropriate. Respiratory: normal effort Skin: Right foot with clean dressing Neuro: alert and oriented Psych: normal affect   A/P: Osteomyelitis of right great toe status post partial amputation - Orders reviewed. Labs for AM ordered, which was adjusted as needed.   Gerlene Fee, DO 05/29/2021, 9:22 PM PGY-3, [place] [facility] Night Resident  Please page [phone] with questions.

## 2021-05-29 NOTE — Evaluation (Signed)
Occupational Therapy Evaluation Patient Details Name: [name] MRN: [id number] DOB: 19-Jun-1971 Today's Date: 05/29/2021   History of Present Illness 50 y.o. female presenting with right toe osteomyelitis. S/p R partial hallux amputation on 10/21. PMH is significant for T2DM, HTN, CKD, Diabetic Retinopathy, Lt Thyroid Nodule, Pseudotumor Cerebri,  Stroke, HLD, Asthma, CAD, HFpEF, OSA   Clinical Impression   PTA, pt was living with her three children and was independent with ADLs and IADLs; not working and filing for disability. Pt currently performing ADLs and functional mobility at Supervision level. Providing education on compensatory techniques to increased safety and adherence to weight bearing orders. Pt demonstrating good adherence to WB status. Answered all pt questions. Recommend dc home once medically stable per physician. All acute OT needs met and will sign off. Thank you.     Recommendations for follow up therapy are one component of a multi-disciplinary discharge planning process, led by the attending physician.  Recommendations may be updated based on patient status, additional functional criteria and insurance authorization.   Follow Up Recommendations  No OT follow up    Equipment Recommendations  None recommended by OT    Recommendations for Other Services PT consult     Precautions / Restrictions Precautions Precautions: Fall Restrictions Weight Bearing Restrictions: Yes RLE Weight Bearing: Partial weight bearing RLE Partial Weight Bearing Percentage or Pounds: Weight through heel only      Mobility Bed Mobility Overal bed mobility: Modified Independent                  Transfers Overall transfer level: Needs assistance Equipment used: Rolling walker (2 wheeled) Transfers: Sit to/from Stand Sit to Stand: Supervision         General transfer comment: Supervision for initial safety. Cues for techniques to maintain WB status.    Balance  Overall balance assessment: Needs assistance Sitting-balance support: No upper extremity supported;Feet supported Sitting balance-Leahy Scale: Normal     Standing balance support: No upper extremity supported;During functional activity Standing balance-Leahy Scale: Fair Standing balance comment: standing at sink without UE support for oral care. Use of RW for maintainng WB status                           ADL either performed or assessed with clinical judgement   ADL Overall ADL's : Needs assistance/impaired     Grooming: Oral care;Supervision/safety;Standing                               Functional mobility during ADLs: Supervision/safety;Rolling walker General ADL Comments: Pt performing ADLs and functional mobility at Supervision level. Providing education on compensatory techniques for safety. Pt verbalizing understanding. Pt reporting she has all needed DME. Pt demonstrating good adherance to WB status     Vision Baseline Vision/History: 1 Wears glasses Patient Visual Report: No change from baseline       Perception     Praxis      Pertinent Vitals/Pain Pain Assessment: 0-10 Pain Score: 0-No pain Pain Intervention(s): Monitored during session;Repositioned     Hand Dominance Right   Extremity/Trunk Assessment Upper Extremity Assessment Upper Extremity Assessment: Overall WFL for tasks assessed   Lower Extremity Assessment Lower Extremity Assessment: RLE deficits/detail   Cervical / Trunk Assessment Cervical / Trunk Assessment: Other exceptions Cervical / Trunk Exceptions: increased body habitus   Communication Communication Communication: No difficulties   Cognition Arousal/Alertness: Awake/alert Behavior  During Therapy: WFL for tasks assessed/performed Overall Cognitive Status: Within Functional Limits for tasks assessed                                     General Comments  Pt reporting she has recieved eviction  papers from her landlord and is going to court on 31st. Requesting to speak with a case manager for information and assistance. Contacted assigned CM and notified doctor.    Exercises     Shoulder Instructions      Home Living Family/patient expects to be discharged to:: Private residence Living Arrangements: Children Available Help at Discharge: Family Type of Home: Apartment (first floor) Home Access: Level entry     Home Layout: One level     Bathroom Shower/Tub: Teacher, early years/pre: Standard     Home Equipment: Tub bench;Crutches;Cane - single point;Walker - 2 wheels;Grab bars - tub/shower   Additional Comments: 3 children live with her (54, 49, and 63 yo)      Prior Functioning/Environment Level of Independence: Independent        Comments: ADLs, IADLs, drive; kids are very helpful. Not working. Going to OP PT for LLE injury.        OT Problem List: Decreased activity tolerance;Impaired balance (sitting and/or standing);Decreased knowledge of precautions;Decreased knowledge of use of DME or AE      OT Treatment/Interventions:      OT Goals(Current goals can be found in the care plan section) Acute Rehab OT Goals Patient Stated Goal: "Get my housing situation figure out" OT Goal Formulation: All assessment and education complete, DC therapy  OT Frequency:     Barriers to D/C:            Co-evaluation              AM-PAC OT "6 Clicks" Daily Activity     Outcome Measure Help from another person eating meals?: None Help from another person taking care of personal grooming?: A Little Help from another person toileting, which includes using toliet, bedpan, or urinal?: A Little Help from another person bathing (including washing, rinsing, drying)?: A Little Help from another person to put on and taking off regular upper body clothing?: None Help from another person to put on and taking off regular lower body clothing?: A Little 6 Click  Score: 20   End of Session Equipment Utilized During Treatment: Rolling walker Nurse Communication: Mobility status  Activity Tolerance: Patient tolerated treatment well Patient left: in bed;with call bell/phone within reach;Other (comment) (with DO)  OT Visit Diagnosis: Other abnormalities of gait and mobility (R26.89);Muscle weakness (generalized) (M62.81)                Time: 9244-6286 OT Time Calculation (min): 27 min Charges:  OT General Charges $OT Visit: 1 Visit OT Evaluation $OT Eval Low Complexity: 1 Low OT Treatments $Self Care/Home Management : 8-22 mins  Llewellyn Schoenberger MSOT, OTR/L Acute Rehab Pager: [phone] Office: [place] 05/29/2021, 9:01 AM

## 2021-05-29 NOTE — Social Work (Signed)
CSW notes patient is facing eviction/ homelessness unfortunately at this time there is significant barriers to securing housing in the medical setting. CSW has provided patient with information on resources to follow-up with in the community once they discharge, as well as legal aid [place]. CSW notes patient arrived to the hospital with a pending eviction and they will soon be medically cleared they are appropriate to discharge back to their current living situation.   Pt asked for a doctors note at discharge. CSW informed MD.   Emeterio Reeve, LCSW Clinical Social Worker

## 2021-05-29 NOTE — Evaluation (Signed)
Physical Therapy Evaluation Patient Details Name: [name] MRN: [id number] DOB: January 14, 1971 Today's Date: 05/29/2021  History of Present Illness  Pt. is 50 yr old F admitted on 10/20 with c/o ulcer great R toe. OP wound care MD recommended amputation. MRI R foot (+) for OM distal 1st phalanx.  Underwent R partial hallux amp on 10/21. PMH: arthritis, asthma, CKD, CAD, DM, HTN, CVA.  Clinical Impression  Pt. Previously independent prior to most recent admission resulting in R partial hallux amputation.  Pt. Now mobilizing primarily with mod I, only needing supervision for safety with sit > stand.  Pt's greatest barrier to safe D/C home is boxes piled around home limiting ability to utilize recommended AD.  Pt. Would benefit from 1 f/u visit in acute care to determine if pt. Can safely mobilize without AD while maintaining WB restrictions.       Recommendations for follow up therapy are one component of a multi-disciplinary discharge planning process, led by the attending physician.  Recommendations may be updated based on patient status, additional functional criteria and insurance authorization.  Follow Up Recommendations No PT follow up    Equipment Recommendations   (Pt has RW)    Recommendations for Other Services       Precautions / Restrictions Restrictions Weight Bearing Restrictions: Yes RLE Weight Bearing: Partial weight bearing RLE Partial Weight Bearing Percentage or Pounds: Heel only      Mobility  Bed Mobility Overal bed mobility: Independent               Patient Response: Cooperative  Transfers   Equipment used: Rolling walker (2 wheeled)   Sit to Stand: Supervision         General transfer comment: Performs sit > stand with supervision only, VCs for safety with hand placement and WB through heel only.  Ambulation/Gait Ambulation/Gait assistance: Modified independent (Device/Increase time) Gait Distance (Feet): 40 Feet Assistive device:  Rolling walker (2 wheeled)       General Gait Details: Amb in room with mod IND.  Demos good adherence to WB restrictions.  Stairs            Wheelchair Mobility    Modified Rankin (Stroke Patients Only)       Balance Overall balance assessment: Mild deficits observed, not formally tested           Standing balance-Leahy Scale: Fair                               Pertinent Vitals/Pain Pain Assessment: 0-10 Pain Score: 0-No pain Pain Location: No pain, states the foot is numb    Home Living Family/patient expects to be discharged to:: Private residence (States she is in the process of moving, has boxes piled all over the house.  Will be unable to use RW in home when she returns.  States there is no where else she can stay.) Living Arrangements: Children Available Help at Discharge: Family Type of Home: Apartment       Home Layout: One level Home Equipment: Walker - 2 wheels Additional Comments: Has RW, but does not use it at baseline.    Prior Function Level of Independence: Independent               Hand Dominance        Extremity/Trunk Assessment   Upper Extremity Assessment Upper Extremity Assessment: Defer to OT evaluation    Lower Extremity Assessment Lower  Extremity Assessment: Overall WFL for tasks assessed       Communication      Cognition Arousal/Alertness: Awake/alert Behavior During Therapy: WFL for tasks assessed/performed Overall Cognitive Status: Within Functional Limits for tasks assessed                                 General Comments: Alert and oriented x 3.  Agreeable to work with PT.      General Comments      Exercises     Assessment/Plan    PT Assessment Patient needs continued PT services  PT Problem List Decreased mobility;Decreased activity tolerance;Decreased balance;Decreased strength       PT Treatment Interventions Gait training;Functional mobility training;Therapeutic  activities;Patient/family education;Balance training;Therapeutic exercise;DME instruction    PT Goals (Current goals can be found in the Care Plan section)  Acute Rehab PT Goals Patient Stated Goal: Pt's goal is to return to independence PT Goal Formulation: With patient Time For Goal Achievement: 06/12/21 Potential to Achieve Goals: Good    Frequency Min 3X/week   Barriers to discharge Inaccessible home environment As states previously, boxes piled limiting ability to use AD.  Discussed with CM.    Co-evaluation               AM-PAC PT "6 Clicks" Mobility  Outcome Measure Help needed turning from your back to your side while in a flat bed without using bedrails?: None Help needed moving from lying on your back to sitting on the side of a flat bed without using bedrails?: None Help needed moving to and from a bed to a chair (including a wheelchair)?: None Help needed standing up from a chair using your arms (e.g., wheelchair or bedside chair)?: A Little Help needed to walk in hospital room?: None Help needed climbing 3-5 steps with a railing? : A Little 6 Click Score: 22    End of Session Equipment Utilized During Treatment: Gait belt Activity Tolerance: Patient tolerated treatment well;No increased pain Patient left: in bed;with nursing/sitter in room (NSG in room checking IV due to infiltration) Nurse Communication: Mobility status PT Visit Diagnosis: Other abnormalities of gait and mobility (R26.89)    Time: 1400-1417 PT Time Calculation (min) (ACUTE ONLY): 17 min   Charges:   PT Evaluation $PT Eval Low Complexity: 1 Low          Hilton Saephan A. Maelie Chriswell, PT, DPT Acute Rehabilitation Services Office: [place] 05/29/2021, 2:37 PM

## 2021-05-29 NOTE — Progress Notes (Signed)
PT placed self on cpap for the night.

## 2021-05-29 NOTE — Progress Notes (Signed)
Subjective:  Patient ID: [name], female    DOB: Jan 18, 1971,  MRN: [id number]   Patient s/p right partial hallux amputation. POD#1. Pateint seen at bedside. Doing well. Denies pain. Denies nausea, vomiting, fever or chills.   Past Medical History:  Diagnosis Date   Arthritis    Asthma    Cataract    Mixed form OU   CKD (chronic kidney disease)    Coronary artery disease    Diabetes mellitus    Diabetic retinopathy ([place])    NPDR OU   Hyperlipidemia    Hypertension    Hypertensive retinopathy    OU   Left thyroid nodule    diagnosed 07/2018   PAC (premature atrial contraction) 02/15/2021   Pseudotumor cerebri    Stroke ([place])    Vitamin D deficiency      Past Surgical History:  Procedure Laterality Date   ACHILLES TENDON REPAIR     ACHILLES TENDON SURGERY Left 02/19/2021   Procedure: ACHILLES TENDON REPAIR WITH GRAFT;  Surgeon: Felipa Furnace, DPM;  Location: [facility];  Service: Podiatry;  Laterality: Left;  BLOCK   AMPUTATION TOE Right 05/28/2021   Procedure: AMPUTATION TOE;  Surgeon: Lorenda Peck, MD;  Location: [facility];  Service: Podiatry;  Laterality: Right;  Surgical team will do block   GASTROC RECESSION EXTREMITY Left 02/19/2021   Procedure: GASTROC RECESSION EXTREMITY;  Surgeon: Felipa Furnace, DPM;  Location: [facility];  Service: Podiatry;  Laterality: Left;  Block   TUBAL LIGATION      CBC Latest Ref Rng & Units 05/29/2021 05/28/2021 05/27/2021  WBC 4.0 - 10.5 K/uL 6.0 9.3 9.1  Hemoglobin 12.0 - 15.0 g/dL 10.8(L) 10.5(L) 12.4  Hematocrit 36.0 - 46.0 % 31.6(L) 31.2(L) 37.7  Platelets 150 - 400 K/uL 235 237 302    BMP Latest Ref Rng & Units 05/29/2021 05/28/2021 05/28/2021  Glucose 70 - 99 mg/dL 195(H) 120(H) 196(H)  BUN 6 - 20 mg/dL 20 19 23(H)  Creatinine 0.44 - 1.00 mg/dL 2.01(H) 1.70(H) 1.96(H)  BUN/Creat Ratio 9 - 23 - - -  Sodium 135 - 145 mmol/L 137 136 135  Potassium 3.5 - 5.1 mmol/L 3.3(L) 3.5 2.9(L)   Chloride 98 - 111 mmol/L 103 104 102  CO2 22 - 32 mmol/L 26 23 24   Calcium 8.9 - 10.3 mg/dL 7.9(L) 8.3(L) 7.9(L)     Objective:   Vitals:   05/29/21 0520 05/29/21 0740  BP: (!) 168/79 (!) 176/88  Pulse: 69 67  Resp: 18 17  Temp: 98.5 F (36.9 C) 98.6 F (37 C)  SpO2: 98% 99%    General:AA&O x 3. Normal mood and affect   Vascular: DP and PT pulses 2/4 bilateral. Brisk capillary refill to all digits. Pedal hair present   Neruological. Epicritic sensation grossly intact.   Derm:Dressing intact with no strike through noted.   MSK: MMT 5/5 in dorsiflexion, plantar flexion, inversion and eversion. Normal joint ROM without pain or crepitus.    Assessment & Plan:  Patient was evaluated and treated and all questions answered.  DX: right hallux osteomyelitis  S/P right partial hallux amputation POD#1.  Wound care: keep dressing intact.  Antibiotics: Likely will not need any  antibiotics on discharge if cultures negative  Cultures: Pending Gram stain: negative organisms. DME: surgical shoe  Discussed post-operative course. Expressed understating and all questions answered.  Patient will follow-up with podiatry in one week from discharge  Patient ok for discharge from podiatry standpoint.  Lorenda Peck, MD  Accessible via secure chat for questions or concerns.

## 2021-05-29 NOTE — Progress Notes (Signed)
Family Medicine Teaching Service Daily Progress Note Intern Pager: [phone]  Patient name: [name] Medical record number: [id number] Date of birth: Oct 15, 1970 Age: 50 y.o. Gender: female  Primary Care Provider: [name], MD Consultants: Podiatry Code Status: Full  Pt Overview and Major Events to Date:  05/28/2021: Admitted to [facility] 05/28/2021: Right partial hallux amputation  Assessment and Plan: [name] is a 50 y.o. female with history of T2DM, HTN, CKD, diabetic retinopathy, pseudotumor cerebri, CVA, HLD, CAD, HFpEF, OSA and is admitted for osteomyelitis.   Osteomyelitis of right great toe status post partial amputation Postop day 1. Denies pain. Continues to have numbness in her lower extremity likely secondary to local surgical block but possibly ongoing peripheral neuropathy.  OT in room.  PT to evaluate later today or balance issues. There is good movement of her remaining phalanges. Hgb 10.8 this morning.  -Podiatry following, appreciate recommendations -Discontinue Vancomycin per podiatry -VTE prophylaxis ok to restart today per podiatry -Postoperative PT and OT: Eval and treat -Monitor for signs and symptoms of infection -Follow up surgical cultures    Acute on chronic renal failure  Hypokalemia Creatinine on admission 1.76.  Creatinine creatinine 2.01 this morning with baseline 1.5-1.6. K 3.3 today. Replete with 40 mEq Kdur - Hold home benazepril - Avoid nephrotoxic agents - daily BMPs  - LR Maintenance IV fluids  - Replete potassium as needed    Influenza A Remains asymptomatic. -Continue Tamiflu   Uncontrolled type 2 diabetes Glucose 195 this morning.  Patient A1c 8.7. -Holding home medications -Continue moderate sliding scale insulin -Continue Semglee 10 units daily -Monitor CBGs   Hypertension Patient intermittently hypertensive.  -Continue home amlodipine, carvedilol - Restart home Bidil 20-37.5mg   TID -Holding home  benazepril, Torsemide in the setting of AKI   Hyperlipidemia  Coronary artery disease  History of stroke Stable. - Continue home aspirin and Crestor.   Heart failure with preserved ejection fraction Stable. -Continue Coreg and amlodipine -Holding home benazepril, Torsemide in the setting of AKI   Obstructive sleep apnea - CPAP nightly  FEN/GI: Heart healthy carb modified diet, replete electrolytes as needed PPx: SCDs  Disposition: Pending surgical cultures   Subjective:  No acute overnight events.  Objective: Temp:  [97 F (36.1 C)-98.9 F (37.2 C)] 98.5 F (36.9 C) (10/22 0520) Pulse Rate:  [68-78] 69 (10/22 0520) Resp:  [17-19] 18 (10/22 0520) BP: (125-224)/(71-99) 168/79 (10/22 0520) SpO2:  [98 %-100 %] 98 % (10/22 0520)  Physical Exam: General: well appearing, laughing this morning  Cardiovascular: regular rate and rhythm Respiratory: clear to ascultation bilaterally  Abdomen: soft, non-tender Extremities: right foot bandage with dried blood, small area of wet bright red blood, movement in remaining phalanges, no reported sensation in foot with palpation, sensation intact to at shin  Laboratory: Recent Labs  Lab 05/27/21 1458 05/28/21 0219 05/29/21 0150  WBC 9.1 9.3 6.0  HGB 12.4 10.5* 10.8*  HCT 37.7 31.2* 31.6*  PLT 302 237 235   Recent Labs  Lab 05/27/21 1458 05/28/21 0219 05/28/21 1431 05/29/21 0150  NA 141 135 136 137  K 3.5 2.9* 3.5 3.3*  CL 104 102 104 103  CO2 26 24 23 26   BUN 17 23* 19 20  CREATININE 1.76* 1.96* 1.70* 2.01*  CALCIUM 8.6* 7.9* 8.3* 7.9*  PROT 7.1  --   --   --   BILITOT 0.5  --   --   --   ALKPHOS 63  --   --   --  ALT 12  --   --   --   AST 14*  --   --   --   GLUCOSE 130* 196* 120* 195*      Imaging/Diagnostic Tests: DG Foot 2 Views Right  Result Date: 05/28/2021 CLINICAL DATA:  Postop EXAM: RIGHT FOOT - 2 VIEW COMPARISON:  05/27/2021 FINDINGS: Interval amputation of the first distal phalanx with smooth  cut margins. No acute osseous abnormality. IMPRESSION: Interval amputation of first digit at the level of the IP joint with expected postsurgical change Electronically Signed   By: Donavan Foil M.D.   On: 05/28/2021 19:23     Lyndee Hensen, DO 05/29/2021, 6:52 AM PGY-3, [place] Intern pager: [phone], text pages welcome

## 2021-05-30 ENCOUNTER — Encounter (HOSPITAL_COMMUNITY): Payer: Self-pay

## 2021-05-30 ENCOUNTER — Encounter (HOSPITAL_COMMUNITY): Payer: Self-pay | Admitting: *Deleted

## 2021-05-30 DIAGNOSIS — E08621 Diabetes mellitus due to underlying condition with foot ulcer: Secondary | ICD-10-CM

## 2021-05-30 DIAGNOSIS — I10 Essential (primary) hypertension: Secondary | ICD-10-CM | POA: Diagnosis not present

## 2021-05-30 DIAGNOSIS — E11621 Type 2 diabetes mellitus with foot ulcer: Secondary | ICD-10-CM | POA: Diagnosis not present

## 2021-05-30 DIAGNOSIS — J101 Influenza due to other identified influenza virus with other respiratory manifestations: Secondary | ICD-10-CM

## 2021-05-30 DIAGNOSIS — L97512 Non-pressure chronic ulcer of other part of right foot with fat layer exposed: Secondary | ICD-10-CM

## 2021-05-30 DIAGNOSIS — M86471 Chronic osteomyelitis with draining sinus, right ankle and foot: Secondary | ICD-10-CM | POA: Diagnosis not present

## 2021-05-30 LAB — CBC
HCT: 30.3 % — ABNORMAL LOW (ref 36.0–46.0)
Hemoglobin: 10.3 g/dL — ABNORMAL LOW (ref 12.0–15.0)
MCH: 30.3 pg (ref 26.0–34.0)
MCHC: 34 g/dL (ref 30.0–36.0)
MCV: 89.1 fL (ref 80.0–100.0)
Platelets: 241 10*3/uL (ref 150–400)
RBC: 3.4 MIL/uL — ABNORMAL LOW (ref 3.87–5.11)
RDW: 13.7 % (ref 11.5–15.5)
WBC: 6.5 10*3/uL (ref 4.0–10.5)
nRBC: 0 % (ref 0.0–0.2)

## 2021-05-30 LAB — ALBUMIN: Albumin: 2.8 g/dL — ABNORMAL LOW (ref 3.5–5.0)

## 2021-05-30 LAB — BASIC METABOLIC PANEL
Anion gap: 7 (ref 5–15)
BUN: 23 mg/dL — ABNORMAL HIGH (ref 6–20)
CO2: 25 mmol/L (ref 22–32)
Calcium: 7.7 mg/dL — ABNORMAL LOW (ref 8.9–10.3)
Chloride: 105 mmol/L (ref 98–111)
Creatinine, Ser: 2.03 mg/dL — ABNORMAL HIGH (ref 0.44–1.00)
GFR, Estimated: 30 mL/min — ABNORMAL LOW (ref >60)
Glucose, Bld: 163 mg/dL — ABNORMAL HIGH (ref 70–99)
Potassium: 3.7 mmol/L (ref 3.5–5.1)
Sodium: 137 mmol/L (ref 135–145)

## 2021-05-30 LAB — GLUCOSE, CAPILLARY
Glucose-Capillary: 128 mg/dL — ABNORMAL HIGH (ref 70–99)
Glucose-Capillary: 166 mg/dL — ABNORMAL HIGH (ref 70–99)

## 2021-05-30 MED ORDER — AMLODIPINE BESYLATE 10 MG PO TABS: 10.0000 mg | ORAL_TABLET | Freq: Every day | ORAL | 0 refills | Status: DC

## 2021-05-30 MED ORDER — OSELTAMIVIR PHOSPHATE 30 MG PO CAPS: 30.0000 mg | ORAL_CAPSULE | Freq: Two times a day (BID) | ORAL | 0 refills | Status: AC

## 2021-05-30 MED ORDER — GLIPIZIDE 5 MG PO TABS: 2.5000 mg | ORAL_TABLET | Freq: Every day | ORAL | 0 refills | Status: DC

## 2021-05-30 MED ORDER — HYDROCODONE-ACETAMINOPHEN 5-325 MG PO TABS: 1.0000 | ORAL_TABLET | Freq: Four times a day (QID) | ORAL | 0 refills | Status: AC | PRN

## 2021-05-30 MED ORDER — HYDROCODONE-ACETAMINOPHEN 5-325 MG PO TABS
1.0000 | ORAL_TABLET | Freq: Four times a day (QID) | ORAL | Status: DC | PRN
  Administered 2021-05-30: 1 via ORAL
  Filled 2021-05-30 (×2): qty 1

## 2021-05-30 NOTE — TOC Transition Note (Signed)
Transition of Care [facility]) - CM/SW Discharge Note   Patient Details  Name: [name] MRN: [id number] Date of Birth: 1971/01/08  [facility] CM/SW Contact:  Bartholomew Crews, RN Phone Number: [phone] 05/30/2021, 2:45 PM   Clinical Narrative:     Notified by nursing of patient needing bedside commode. DME order requested. Referral sent to [employer] for delivery to the room.   Final next level of care: Home/Self Care Barriers to Discharge: No Barriers Identified   Patient Goals and CMS Choice        Discharge Placement                       Discharge Plan and Services                                     Social Determinants of Health (SDOH) Interventions     Readmission Risk Interventions No flowsheet data found.

## 2021-05-30 NOTE — Progress Notes (Signed)
Subjective:  Patient ID: [name], female    DOB: October 21, 1970,  MRN: [id number]   Patient s/p right partial hallux amputation. POD#2. Pateint seen at bedside. Doing well. Relates pain overnight but doing better currently. Denies nausea, vomiting, fever or chills.   Past Medical History:  Diagnosis Date   Arthritis    Asthma    Cataract    Mixed form OU   CKD (chronic kidney disease)    Coronary artery disease    Diabetes mellitus    Diabetic retinopathy ([place])    NPDR OU   Hyperlipidemia    Hypertension    Hypertensive retinopathy    OU   Left thyroid nodule    diagnosed 07/2018   PAC (premature atrial contraction) 02/15/2021   Pseudotumor cerebri    Stroke ([place])    Vitamin D deficiency      Past Surgical History:  Procedure Laterality Date   ACHILLES TENDON REPAIR     ACHILLES TENDON SURGERY Left 02/19/2021   Procedure: ACHILLES TENDON REPAIR WITH GRAFT;  Surgeon: Felipa Furnace, DPM;  Location: [place];  Service: Podiatry;  Laterality: Left;  BLOCK   AMPUTATION TOE Right 05/28/2021   Procedure: AMPUTATION TOE;  Surgeon: Lorenda Peck, MD;  Location: [place];  Service: Podiatry;  Laterality: Right;  Surgical team will do block   GASTROC RECESSION EXTREMITY Left 02/19/2021   Procedure: GASTROC RECESSION EXTREMITY;  Surgeon: Felipa Furnace, DPM;  Location: [place];  Service: Podiatry;  Laterality: Left;  Block   TUBAL LIGATION      CBC Latest Ref Rng & Units 05/30/2021 05/29/2021 05/28/2021  WBC 4.0 - 10.5 K/uL 6.5 6.0 9.3  Hemoglobin 12.0 - 15.0 g/dL 10.3(L) 10.8(L) 10.5(L)  Hematocrit 36.0 - 46.0 % 30.3(L) 31.6(L) 31.2(L)  Platelets 150 - 400 K/uL 241 235 237    BMP Latest Ref Rng & Units 05/30/2021 05/29/2021 05/28/2021  Glucose 70 - 99 mg/dL 163(H) 195(H) 120(H)  BUN 6 - 20 mg/dL 23(H) 20 19  Creatinine 0.44 - 1.00 mg/dL 2.03(H) 2.01(H) 1.70(H)  BUN/Creat Ratio 9 - 23 - - -  Sodium 135 - 145 mmol/L 137 137 136  Potassium  3.5 - 5.1 mmol/L 3.7 3.3(L) 3.5  Chloride 98 - 111 mmol/L 105 103 104  CO2 22 - 32 mmol/L 25 26 23   Calcium 8.9 - 10.3 mg/dL 7.7(L) 7.9(L) 8.3(L)     Objective:   Vitals:   05/30/21 0503 05/30/21 0813  BP: 127/62 (!) 172/87  Pulse: 68 64  Resp:  14  Temp: 98.2 F (36.8 C) 98.1 F (36.7 C)  SpO2: 97% 100%    General:AA&O x 3. Normal mood and affect   Vascular: DP and PT pulses 2/4 bilateral. Brisk capillary refill to all digits. Pedal hair present   Neruological. Epicritic sensation grossly intact.   Derm:Dressing intact with no strike through noted. 4 cm incision to right distal hallux intact and well coapted with no dehiscence noted. No erythema or edema or purulence noted.   MSK: MMT 5/5 in dorsiflexion, plantar flexion, inversion and eversion. Normal joint ROM without pain or crepitus.    Assessment & Plan:  Patient was evaluated and treated and all questions answered.  DX: right hallux osteomyelitis  S/P right partial hallux amputation POD#2.  Wound care: keep dressing intact. Dressing changed today at bedside.  Antibiotics: Likely will not need any  antibiotics on discharge if cultures negative  Cultures: Pending Gram stain: negative organisms. DME: surgical  shoe  Discussed post-operative course. Expressed understating and all questions answered.  Patient will follow-up with podiatry in one week from discharge  Patient ok for discharge from podiatry standpoint.    Lorenda Peck, MD  Accessible via secure chat for questions or concerns.

## 2021-05-30 NOTE — Discharge Summary (Addendum)
[facility] Discharge Summary  Patient name: [name] Medical record number: [id number] Date of birth: 1970-12-22 Age: 50 y.o. Gender: female Date of Admission: 05/27/2021        Date of Discharge: 05/30/21 Admitting Physician: Martyn Malay, MD  Primary Care Provider: [name], MD Consultants: Podiatry  Indication for Hospitalization: Osteomyelitis of great right toe  Discharge Diagnoses/Problem List:  Principal Problem:   Diabetic foot ulcer ([place]) Active Problems:   Obstructive sleep apnea   Essential hypertension   Type 2 diabetes with complication ([place])   Chronic osteomyelitis of right foot with draining sinus ([place])   Influenza A   Disposition: Home  Discharge Condition: Stable   Discharge Exam:  General: Pt sitting up in bed, eating breakfast, NAD Cardio: Regular rate, irregular rhythm, PVCs appriciated Lungs: CTAB, normal WOB Abdomen: normal bowel sounds, non tender to palpation, soft  Brief Hospital Course:  [name] is a 50 y.o. female presenting with right toe osteomyelitis. PMH is significant for T2DM, HTN, CKD, Diabetic Retinopathy, Lt Thyroid Nodule, Pseudotumor Cerebri,  Stroke, HLD, Asthma, CAD, HFpEF, OSA  Osteomyelitis of Great Right Toe Patient was sent over by Dr. Posey Pronto who recommended she come to the hospital for IV antibiotics, MRI of R foot, and likely amputation due to worsening ulcer on the dorsum of the right great toe which is approximately 0.5 cm deep. Patient did not meet SIRS criteria.  She was tachycardic into the low 100s, lactic acid is elevated at 2.5, repeat 1.8. White blood cell count WNL and she remained afebrile.  Patient was started on vancomycin in the ED. MRI on 05/27/21 confirmed osteomyelitis, plantar soft tissue ulceration and soft tissue edema of great right toe.  Patient had a partial amputation of right great toe on 05/28/2021 and has an appointment to follow-up with podiatry on  06/02/2021.  Patient was not discharged with antibiotics.  Patient was given Tylenol and  Oxycodone 5 mg for pain management and was discharged with Norco 5-325 mg 1 tab q6h prn.   Acute on chronic renal failure  hypokalemia Creatinine admission was 1.76.  Creatinine increased to low 2s and was 2.3 at discharge.  GFR was 30. Patient was given IV fluids during her stay.  [name] potassium was as low as 3.3.  Potassium was repleted appropriately and was 3.7 upon discharge. Due to kidney function, home medications including Benazepril, ibuprofen, lasix and metformin were discontinued. Glipizide dose was decreased.   Flu + Patient tested positive incidentally for the flu upon admission.  She remained asymptomatic during her stay and was started on Tamiflu.   Uncontrolled DMT2 All home medications were held.  [name] diabetes was managed with moderate SSI and Semglee 10 units daily.  Metformin was held at discharge due to AKI and decreased GFR.  Patient was discharged on decreased dose of glipizide, 2.5 mg by mouth daily due to decreased kidney function and metformin was discontinued.  Hypertension Patient was intermittently hypertensive.  Home benazepril and torsemide were held in the setting of AKI.  She was given home amlodipine and carvedilol.   Dizziness Pt complained of a few episodes of dizziness during her stay. Orthostatic vitals were taken and showed and increase in HR of 41 from lying to standing which then lowered to 91 after 3 minutes of standing. BP did not meet criteria for orthostatic hypotension.   Hyperlipidemia  CAD  history of CVA Patient was continued on home aspirin and Crestor  HFpEF Patient  received home BiDil.  Lasix were held due to kidney function.   Issues for follow up: BMP within 1 week of discharge to monitor potassium and kidney function Ensure follow up with podiatry on 06/02/21  Follow up patients symptoms of dizziness/light headedness Follow up on home  medications which were discontinued/decreased d/t AKI.     Significant Procedures: Right Hallux amputation   Significant Labs and Imaging:  Recent Labs  Lab 05/28/21 0219 05/29/21 0150 05/30/21 0139  WBC 9.3 6.0 6.5  HGB 10.5* 10.8* 10.3*  HCT 31.2* 31.6* 30.3*  PLT 237 235 241   Recent Labs  Lab 05/27/21 1458 05/28/21 0219 05/28/21 1431 05/29/21 0150 05/30/21 0139  NA 141 135 136 137 137  K 3.5 2.9* 3.5 3.3* 3.7  CL 104 102 104 103 105  CO2 26 24 23 26 25   GLUCOSE 130* 196* 120* 195* 163*  BUN 17 23* 19 20 23*  CREATININE 1.76* 1.96* 1.70* 2.01* 2.03*  CALCIUM 8.6* 7.9* 8.3* 7.9* 7.7*  ALKPHOS 63  --   --   --   --   AST 14*  --   --   --   --   ALT 12  --   --   --   --   ALBUMIN 3.4*  --   --   --  2.8*      Results/Tests Pending at Time of Discharge: Wound culture  Discharge Medications:  Allergies as of 05/30/2021       Reactions   Bee Pollen    Seasonal Allergies   Dust Mite Extract    Sneezing & Cough        Medication List     STOP taking these medications    amLODipine-benazepril 10-40 MG capsule Commonly known as: LOTREL   furosemide 40 MG tablet Commonly known as: LASIX   ibuprofen 800 MG tablet Commonly known as: ADVIL   ketorolac 10 MG tablet Commonly known as: TORADOL   metFORMIN 1000 MG tablet Commonly known as: GLUCOPHAGE   oxyCODONE-acetaminophen 5-325 MG tablet Commonly known as: Percocet       TAKE these medications    Accu-Chek FastClix Lancets Misc 4 (four) times daily.   Accu-Chek Guide test strip Generic drug: glucose blood 1 each by Other route See admin instructions. for testing   albuterol 108 (90 Base) MCG/ACT inhaler Commonly known as: VENTOLIN HFA Inhale into the lungs every 6 (six) hours as needed for wheezing or shortness of breath.   amLODipine 10 MG tablet Commonly known as: NORVASC Take 1 tablet (10 mg total) by mouth daily. Start taking on: May 31, 2021   aspirin 81 MG EC  tablet Take 1 tablet (81 mg total) by mouth daily.   BiDil 20-37.5 MG tablet Generic drug: isosorbide-hydrALAZINE Take 2 tablets by mouth 3 (three) times daily.   blood glucose meter kit and supplies Kit Dispense based on patient and insurance preference. Use up to four times daily as directed. (FOR ICD-9 250.00, 250.01).   carvedilol 25 MG tablet Commonly known as: COREG Take 1 tablet (25 mg total) by mouth 2 (two) times daily with a meal.   folic acid 1 MG tablet Commonly known as: FOLVITE Take 1 mg by mouth daily.   glipiZIDE 5 MG tablet Commonly known as: Glucotrol Take 0.5 tablets (2.5 mg total) by mouth daily before breakfast. What changed:  medication strength how much to take when to take this   HYDROcodone-acetaminophen 5-325 MG tablet Commonly known as: NORCO/VICODIN  Take 1 tablet by mouth every 6 (six) hours as needed for up to 7 days for moderate pain.   hydrOXYzine 25 MG capsule Commonly known as: VISTARIL Take 25 mg by mouth at bedtime.   insulin aspart protamine- aspart (70-30) 100 UNIT/ML injection Commonly known as: NOVOLOG MIX 70/30 Inject 0.15 mLs (15 Units total) into the skin 2 (two) times daily with a meal. What changed:  how much to take when to take this additional instructions   Insulin Syringes (Disposable) L-974 1 ML Misc 1 application by Does not apply route 2 (two) times a day.   ipratropium-albuterol 0.5-2.5 (3) MG/3ML Soln Commonly known as: DUONEB Take 3 mLs by nebulization every 4 (four) hours as needed. What changed: reasons to take this   oseltamivir 30 MG capsule Commonly known as: TAMIFLU Take 1 capsule (30 mg total) by mouth 2 (two) times daily for 3 doses.   rosuvastatin 40 MG tablet Commonly known as: CRESTOR Take 40 mg by mouth daily.   vitamin B-12 1000 MCG tablet Commonly known as: CYANOCOBALAMIN Take 1,000 mcg by mouth daily.   Vitamin D3 25 MCG (1000 UT) Caps Take 1 capsule by mouth daily.                Discharge Care Instructions  (From admission, onward)           Start     Ordered   05/30/21 0000  Leave dressing on - Keep it clean, dry, and intact until clinic visit        05/30/21 1212            Discharge Instructions: Please refer to Patient Instructions section of EMR for full details.  Patient was counseled important signs and symptoms that should prompt return to medical care, changes in medications, dietary instructions, activity restrictions, and follow up appointments.   Follow-Up Appointments:  Follow-up Information     Osei-Bonsu, [name], MD. Schedule an appointment as soon as possible for a visit in 1 week(s).   Specialty: Internal Medicine Contact information: [address] [place] [address] [phone]         Felipa Furnace, DPM. Go on 06/02/2021.   Specialty: Podiatry Why: Appointment on 06/02/21 at 8:30 a.m. Contact information: [address] [place] [address] [phone]                 Precious Gilding, DO 05/30/2021, 12:55 PM PGY-1, [place] Upper-Level Resident Addendum   I have discussed the above with Dr. Ronnald Ramp and agree with the documentation. My edits for correction/addition/clarification are included above. Please see any attending notes.   Alcus Dad, MD PGY-2, [facility] 05/30/2021 3:20 PM

## 2021-05-30 NOTE — Progress Notes (Signed)
Letter delivered to pt.

## 2021-05-30 NOTE — Discharge Instructions (Addendum)
Dear [name],  Thank you for letting us participate in your care. You were hospitalized for a diabetic foot ulcer and diagnosed with osteomyelitis, an infection of the bone in your right big toe.  You had a partial amputation of your right big toe.  You will not need to take any antibiotics at this time as the infection was removed with the amputation. Pain medication has been sent to your pharmacy.   During your stay, you were found to have decreased kidney function.  Because of this some of you medications have been discontinued.  Please do NOT take the following medications: Lotrel (amlodipine-benazepril)  Lasix Ibuprofin Toradol Metformin  Please follow up with your podiatrist and also follow up with your primary care physician with in one week of discharge.  POST-HOSPITAL & CARE INSTRUCTIONS Go to your follow up appointments (listed below) and please make an appointment with you primary care physician.  Keep your current wound dressing intact until your appointment with your podiatrist    DOCTOR'S APPOINTMENT   Future Appointments  Date Time Provider [place]  05/31/2021  7:45 AM Ward Chatters, PT [facility]  06/02/2021  8:30 AM Felipa Furnace, DPM [facility]  06/03/2021  8:00 AM Deon Pilling, PTA [facility]  06/04/2021  8:00 AM Bernarda Caffey, MD [facility] None  06/06/2021  9:40 AM [facility] MR 2 [facility]-315MRI [facility]  06/07/2021  7:45 AM Ward Chatters, PT [facility]  06/09/2021  8:30 AM Ward Chatters, PT [facility]  06/14/2021  7:45 AM Ward Chatters, PT [facility]  07/20/2021  3:00 PM Garvin Fila, MD [facility] None  09/28/2021  9:00 AM Deneise Lever, MD [facility] None     Take care and be well!  [facility]  [place], [place] [address] [phone]

## 2021-05-30 NOTE — Progress Notes (Signed)
Wendi from Case management ordered [facility] for pt and it will be delivered to the room. Pt informed.

## 2021-05-30 NOTE — Hospital Course (Addendum)
[name] is a 50 y.o. female presenting with right toe osteomyelitis. PMH is significant for T2DM, HTN, CKD, Diabetic Retinopathy, Lt Thyroid Nodule, Pseudotumor Cerebri,  Stroke, HLD, Asthma, CAD, HFpEF, OSA  Osteomyelitis of Great Right Toe Patient was sent over by Dr. Posey Pronto who recommended she come to the hospital for IV antibiotics, MRI of R foot, and likely amputation due to worsening ulcer on the dorsum of the right great toe which is approximately 0.5 cm deep. Patient did not meet SIRS criteria.  She was tachycardic into the low 100s, lactic acid is elevated at 2.5, repeat 1.8. White blood cell count WNL and she remained afebrile.  Patient was started on vancomycin in the ED. MRI on 05/27/21 confirmed osteomyelitis, plantar soft tissue ulceration and soft tissue edema of great right toe.  Patient had right partial hallux amputation on 05/28/2021 and has an appointment to follow-up with podiatry on 06/02/2021.  Patient was not discharged with antibiotics.  Patient was given Tylenol and  Oxycodone 5 mg for pain management and was discharged with Norco 5-325 mg 1 tab q6h prn.   Acute on chronic renal failure  hypokalemia Creatinine admission was 1.76.  Creatinine increased to low 2s and was 2.3 at discharge.  GFR was 30. Patient was given IV fluids during her stay.  [name] potassium was as low as 3.3.  Potassium was repleted appropriately and was 3.7 upon discharge. Due to kidney function, home medications including Benazepril, ibuprofen, lasix and metformin were discontinued. Glipizide dose was decreased.   Flu + Patient tested positive incidentally for the flu upon admission.  She remained asymptomatic during her stay and was started on Tamiflu.   Uncontrolled DMT2 All home medications were held.  [name] diabetes was managed with moderate SSI and Semglee 10 units daily.  Metformin was held at discharge due to AKI and decreased GFR.  Patient was discharged on decreased dose of  glipizide, 2.5 mg by mouth daily due to decreased kidney function and metformin was discontinued.  Hypertension Patient was intermittently hypertensive.  Home benazepril and torsemide were held in the setting of AKI.  She was given home amlodipine and carvedilol.   Dizziness Pt complained of a few episodes of dizziness during her stay. Orthostatic vitals were taken and showed and increase in HR of 41 from lying to standing which then lowered to 91 after 3 minutes of standing. BP did not meet criteria for orthostatic hypotension.   Hyperlipidemia  CAD  history of CVA Patient was continued on home aspirin and Crestor  HFpEF Patient received home BiDil.  Lasix were held due to kidney function.   Issues for follow up: BMP within 1 week of discharge to monitor potassium and kidney function Ensure follow up with podiatry on 06/02/21  Follow up patients symptoms of dizziness/light headedness Follow up on home medications which were discontinued/decreased d/t AKI.

## 2021-05-30 NOTE — Progress Notes (Signed)
Pt requesting a BSC for home use since she will be at home by herself during the day. Case Management left message.

## 2021-05-30 NOTE — Progress Notes (Signed)
Pts ride is here. BSC on the way and called Ortho tech for surgical shoe. AVS DC instructions copy given and reviewed.

## 2021-05-30 NOTE — Progress Notes (Signed)
Pt received surgical shoe and BSC and DC papers prior to DC to home.

## 2021-05-30 NOTE — Progress Notes (Signed)
Orthopedic Tech Progress Note Patient Details:  [name] 1971-04-16 [id number]  Patient ID: [name], female   DOB: [birth year], 50 y.o.   MRN: [id number] Dropped off post op shoe. [name] [name] 05/30/2021, 4:07 PM

## 2021-05-31 ENCOUNTER — Ambulatory Visit: Payer: Medicaid Other

## 2021-05-31 ENCOUNTER — Other Ambulatory Visit: Payer: Self-pay

## 2021-05-31 DIAGNOSIS — M6281 Muscle weakness (generalized): Secondary | ICD-10-CM | POA: Diagnosis present

## 2021-05-31 DIAGNOSIS — M25572 Pain in left ankle and joints of left foot: Secondary | ICD-10-CM | POA: Diagnosis not present

## 2021-05-31 DIAGNOSIS — R2689 Other abnormalities of gait and mobility: Secondary | ICD-10-CM

## 2021-05-31 NOTE — Therapy (Signed)
[place], [place], [address] Phone: [phone]   Fax:  [phone]  Physical Therapy Treatment  Patient Details  Name: [name] MRN: [id number] Date of Birth: May 02, 1971 Referring Provider (PT): Felipa Furnace, [place]   Encounter Date: 05/31/2021   PT End of Session - 05/31/21 0754     Visit Number 8    Number of Visits 24    Date for PT Re-Evaluation 07/20/21    Authorization Type [employer] 05/03/21-07/23/21- 24 visits    PT Start Time 0752    Activity Tolerance Patient tolerated treatment well;No increased pain    Behavior During Therapy WFL for tasks assessed/performed             Past Medical History:  Diagnosis Date   Arthritis    Asthma    Cataract    Mixed form OU   CKD (chronic kidney disease)    Coronary artery disease    Diabetes mellitus    Diabetic retinopathy ([place])    NPDR OU   Hyperlipidemia    Hypertension    Hypertensive retinopathy    OU   Left thyroid nodule    diagnosed 07/2018   PAC (premature atrial contraction) 02/15/2021   Pseudotumor cerebri    Stroke ([place])    Vitamin D deficiency     Past Surgical History:  Procedure Laterality Date   ACHILLES TENDON REPAIR     ACHILLES TENDON SURGERY Left 02/19/2021   Procedure: ACHILLES TENDON REPAIR WITH GRAFT;  Surgeon: Felipa Furnace, DPM;  Location: [facility];  Service: Podiatry;  Laterality: Left;  BLOCK   AMPUTATION TOE Right 05/28/2021   Procedure: AMPUTATION TOE;  Surgeon: Lorenda Peck, MD;  Location: [facility];  Service: Podiatry;  Laterality: Right;  Surgical team will do block   GASTROC RECESSION EXTREMITY Left 02/19/2021   Procedure: GASTROC RECESSION EXTREMITY;  Surgeon: Felipa Furnace, DPM;  Location: [facility];  Service: Podiatry;  Laterality: Left;  Block   TUBAL LIGATION      There were no vitals filed for this visit.   Subjective Assessment - 05/31/21 0754      Subjective Pt presents s/p R great toe amputation on 05/28/21. Notes pain has been bothering her a good bit since sx and discharge from hospital. She is ready to begin PT at this time.    Currently in Pain? Yes    Pain Score 9     Pain Location Foot    Pain Orientation Right           [facility] Adult PT Treatment/Exercise:   Therapeutic Exercise:  Seated Heel Toe Raise -  2x20 Seated Toe scrunch L x 60 sec Seated Inversion/ eversion towel slides x 5   Past Interventions Not Performed Today: Seated Ankle Dorsi Flexion with Resistance Loop Green 2 sets 10 reps ( cues for eccentric control) Standing Hip abduction at counter 10 reps 2 sets each NuStep lvl 5 LE only  x 6 min while taking subjective Left Step up x 15  6in step Standing Tandem Balance at counter 2x30 sec ea Leg press 20# horizontal Claf raise 10 x 2  Blue rocker board A/P rocking with min UE touch at free motion bar  Seated Baps Level 2 : A/P ,circles x 15 each direction; Seated Ankle Plantar Flexion with blue theraband - 3x15 reps (cues for eccentric control)  Seated Heel Toe Raise -  2 sets - 10  reps- added 6# dumbbell to knee  Seated DF stretch with Strap 30 sec x 3 L   Manual Therapy(NOT TODAY):  Prone soft tissue work to left gastroc, passive DF                                PT Short Term Goals - 05/26/21 0927       PT SHORT TERM GOAL #1   Title Pt will be compliant and knowledgeable with initial HEP for improved carryover    Baseline initial HEP given; 05/26/21: compliant and independent    Time 3    Period Weeks    Status Achieved    Target Date 05/18/21               PT Long Term Goals - 04/27/21 1423       PT LONG TERM GOAL #1   Title Pt will improve LEFS score to no less than 48/80 as proxy for functional improvement    Baseline 36/80    Time 12    Period Weeks    Status New    Target Date 07/20/21      PT LONG TERM GOAL #2   Title Pt wil decrease 5xSTS to  no greater than 15 seconds for improved functional mobility and LE strength    Baseline 23 seconds w/ bilat UE support    Time 12    Period Weeks    Status New    Target Date 07/20/21      PT LONG TERM GOAL #3   Title Pt will hold L SLS to 20 sec for improved balance and safety    Baseline 5 sec in tandem L back    Time 12    Period Weeks    Status New    Target Date 07/20/21      PT LONG TERM GOAL #4   Title Pt will improve TUG to no greater than 10 seconds for improved balance and decreased fall risk    Baseline 19 seconds with LoB towards L    Time 12    Period Weeks    Status New    Target Date 07/20/21                   Plan - 05/31/21 3825     Clinical Impression Statement Pt presented to PT with increased R foot pain today after emergency amputation of R great toe on 05/28/21. Most interventions were withheld today, as will future PT visits until f/u with MD. Pt had been progressing well until this new change in medical status. Will await post op visits recommendation and proceed as advised.    PT Treatment/Interventions ADLs/Self Care Home Management;Electrical Stimulation;Cryotherapy;Gait training;Stair training;Functional mobility training;Therapeutic activities;Therapeutic exercise;Balance training;Neuromuscular re-education;Patient/family education;Manual techniques;Vasopneumatic Device;Taping    PT Next Visit Plan await post-op visits; assess continued HEP response    PT Home Exercise Plan Access Code: [email or web address]             Patient will benefit from skilled therapeutic intervention in order to improve the following deficits and impairments:  Abnormal gait, Decreased activity tolerance, Decreased balance, Decreased endurance, Decreased mobility, Decreased range of motion, Decreased strength, Difficulty walking, Pain  Visit Diagnosis: Muscle weakness (generalized)  Pain in left ankle and joints of left foot  Other abnormalities of gait and  mobility     Problem List Patient Active Problem List   Diagnosis Date  Noted   Chronic osteomyelitis of right foot with draining sinus (HCC)    Influenza A    Diabetic foot ulcer ([place]) 05/27/2021   PAC (premature atrial contraction) 02/15/2021   Symptomatic anemia 06/25/2020   Chronic heart failure with preserved ejection fraction ([place]) 06/25/2020   Educated about COVID-19 virus infection 04/29/2020   Class 2 severe obesity with serious comorbidity and body mass index (BMI) of 36.0 to 36.9 in adult ([place]) 03/30/2020   NSVT (nonsustained ventricular tachycardia) 11/13/2019   Pericardial effusion 09/17/2019   H/O: stroke 04/09/2019   Left ventricular hypertrophy 04/09/2019   Mixed hyperlipidemia 04/08/2019   Uncontrolled type 2 diabetes mellitus with hyperglycemia ([place]) 04/08/2019   Non compliance w medication regimen 02/20/2019   Diabetes mellitus type 2 in obese ([place])    Resistant hypertension    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[place]) 02/13/2019   Cerebrovascular accident (CVA) ([place])    Hypokalemia    Hypomagnesemia    AKI (acute kidney injury) ([place])    Acute CVA (cerebrovascular accident) ([place]) 02/11/2019   Hypertensive urgency 02/11/2019   ARF (acute renal failure) ([place]) 02/11/2019   Type 2 diabetes mellitus with vascular disease ([place]) 02/11/2019   Type 2 diabetes with complication ([place]) 16/02/3709   Essential hypertension 01/08/2018   Obstructive sleep apnea 08/26/2017    Ward Chatters, PT 05/31/2021, 8:21 AM  [facility] [address] [place], [place], [address] Phone: [phone]   Fax:  [phone]  Name: [name] MRN: [id number] Date of Birth: 15-Mar-1971

## 2021-05-31 NOTE — Progress Notes (Incomplete)
[facility] Note  06/04/2021     CHIEF COMPLAINT Patient presents for No chief complaint on file.   HISTORY OF PRESENT ILLNESS: JREAM BROYLES is a 50 y.o. female who presents to the clinic today for:      Referring physician: [name], MD [address] [place],  [place] [id number]  HISTORICAL INFORMATION:   Selected notes from the MEDICAL RECORD NUMBER Referred by Dr. Gevena Cotton for concern of PDR OU   CURRENT MEDICATIONS: No current outpatient medications on file. (Ophthalmic Drugs)   No current facility-administered medications for this visit. (Ophthalmic Drugs)   Current Outpatient Medications (Other)  Medication Sig   Accu-Chek FastClix Lancets MISC 4 (four) times daily.   ACCU-CHEK GUIDE test strip 1 each by Other route See admin instructions. for testing   albuterol (VENTOLIN HFA) 108 (90 Base) MCG/ACT inhaler Inhale into the lungs every 6 (six) hours as needed for wheezing or shortness of breath.   amLODipine (NORVASC) 10 MG tablet Take 1 tablet (10 mg total) by mouth daily.   aspirin 81 MG EC tablet Take 1 tablet (81 mg total) by mouth daily.   blood glucose meter kit and supplies KIT Dispense based on patient and insurance preference. Use up to four times daily as directed. (FOR ICD-9 250.00, 250.01).   carvedilol (COREG) 25 MG tablet Take 1 tablet (25 mg total) by mouth 2 (two) times daily with a meal.   Cholecalciferol (VITAMIN D3) 25 MCG (1000 UT) CAPS Take 1 capsule by mouth daily.   folic acid (FOLVITE) 1 MG tablet Take 1 mg by mouth daily.   glipiZIDE (GLUCOTROL) 5 MG tablet Take 0.5 tablets (2.5 mg total) by mouth daily before breakfast.   HYDROcodone-acetaminophen (NORCO/VICODIN) 5-325 MG tablet Take 1 tablet by mouth every 6 (six) hours as needed for up to 7 days for moderate pain.   hydrOXYzine (VISTARIL) 25 MG capsule Take 25 mg by mouth at bedtime.   insulin aspart protamine- aspart (NOVOLOG MIX  70/30) (70-30) 100 UNIT/ML injection Inject 0.15 mLs (15 Units total) into the skin 2 (two) times daily with a meal. (Patient taking differently: Inject 15-17 Units into the skin See admin instructions. Inject 15 units subcutaneous in the morning, then inject 17 units subcutaneous in the evening)   Insulin Syringes, Disposable, H-702 1 ML MISC 1 application by Does not apply route 2 (two) times a day.   ipratropium-albuterol (DUONEB) 0.5-2.5 (3) MG/3ML SOLN Take 3 mLs by nebulization every 4 (four) hours as needed. (Patient taking differently: Take 3 mLs by nebulization every 4 (four) hours as needed (sob/wheezing).)   isosorbide-hydrALAZINE (BIDIL) 20-37.5 MG tablet Take 2 tablets by mouth 3 (three) times daily.   oseltamivir (TAMIFLU) 30 MG capsule Take 1 capsule (30 mg total) by mouth 2 (two) times daily for 3 doses.   rosuvastatin (CRESTOR) 40 MG tablet Take 40 mg by mouth daily.   vitamin B-12 (CYANOCOBALAMIN) 1000 MCG tablet Take 1,000 mcg by mouth daily.   No current facility-administered medications for this visit. (Other)   REVIEW OF SYSTEMS:     ALLERGIES Allergies  Allergen Reactions   Bee Pollen     Seasonal Allergies   Dust Mite Extract     Sneezing & Cough    PAST MEDICAL HISTORY Past Medical History:  Diagnosis Date   Arthritis    Asthma    Cataract    Mixed form OU   CKD (chronic kidney disease)  Coronary artery disease    Diabetes mellitus    Diabetic retinopathy ([place])    NPDR OU   Hyperlipidemia    Hypertension    Hypertensive retinopathy    OU   Left thyroid nodule    diagnosed 07/2018   PAC (premature atrial contraction) 02/15/2021   Pseudotumor cerebri    Stroke [facility])    Vitamin D deficiency    Past Surgical History:  Procedure Laterality Date   ACHILLES TENDON REPAIR     ACHILLES TENDON SURGERY Left 02/19/2021   Procedure: ACHILLES TENDON REPAIR WITH GRAFT;  Surgeon: Felipa Furnace, DPM;  Location: [facility];  Service: Podiatry;   Laterality: Left;  BLOCK   AMPUTATION TOE Right 05/28/2021   Procedure: AMPUTATION TOE;  Surgeon: Lorenda Peck, MD;  Location: [facility];  Service: Podiatry;  Laterality: Right;  Surgical team will do block   GASTROC RECESSION EXTREMITY Left 02/19/2021   Procedure: GASTROC RECESSION EXTREMITY;  Surgeon: Felipa Furnace, DPM;  Location: [facility];  Service: Podiatry;  Laterality: Left;  Block   TUBAL LIGATION     FAMILY HISTORY Family History  Problem Relation Age of Onset   Asthma Mother    Diabetes Father    Hypertension Father    Kidney disease Father    Hypertension Sister    Asthma Sister    Congestive Heart Failure Maternal Aunt    Diabetes Maternal Grandmother    Congestive Heart Failure Maternal Grandmother    Lung disease Maternal Grandmother    Asthma Other    Hyperlipidemia Other    Hypertension Other    Cancer Other    SOCIAL HISTORY Social History   Tobacco Use   Smoking status: Former    Packs/day: 0.50    Years: 30.00    Pack years: 15.00    Types: Cigarettes    Quit date: 2017    Years since quitting: 5.8   Smokeless tobacco: Never  Vaping Use   Vaping Use: Never used  Substance Use Topics   Alcohol use: Not Currently    Alcohol/week: 0.0 standard drinks    Comment: rare   Drug use: Yes    Types: Marijuana    Comment: daily         OPHTHALMIC EXAM:  Not recorded     IMAGING AND PROCEDURES  Imaging and Procedures for _0 @           ASSESSMENT/PLAN:  No diagnosis found.     1,2. 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  - exam shows scattered IRH and CWS OU  - OCT OD: Stable improvement in non-central cystic changes and disc edema, Mild diffuse retinal thinning; OS: Persistent IRF temporal fovea -- ?trace improvement improvement in disc edema, Mild diffuse retinal thinning  - FA (04.20.21) shows delayed filling time OS; no frank NV, but late leaking MA and scattered patches of capillary  nonperfusion OU  - discussed findings and prognosis  - may need anti-VEGF for macular edema, but will hold off for now while BP is being worked on -- pt is very phobic of injections, but is open to focal laser  - also has history of being in the hospital for anemia -- had history of vaginal bleeding -- received RBC transfusion   - f/u 6 weeks, DFE, OCT, FA transit OS, possible focal laser  3-5. Hypertensive retinopathy OU with history of stroke  - stroke July 2020 -- ischemic non-hemorrhagic infarct involving L lateral midbrain/pons  -  BP in office on 11.23.2020 was  209/112 (left arm)  - 01.20.21, bp 160/80  - repeat MRI brain done 01.12.21, which showed new subacute infarction R frontal parasagittal and anterior coprus callosum  - pt reports recent changes in BP meds  - discussed importance of tight BP control  - monitor   6. History of pseudotumor cerebri  - exam and OCT show interval improvement in optic disc edema OU from prior   - under the expert management and monitoring of Dr. Frederico Hamman   7. 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  - discussed diagnosis and progression  - not yet visually significant  - monitor for now  Ophthalmic Meds Ordered this visit:  No orders of the defined types were placed in this encounter.     No follow-ups on file.  There are no Patient Instructions on file for this visit.   Explained the diagnoses, plan, and follow up with the patient and they expressed understanding.  Patient expressed understanding of the importance of proper follow up care.   This document serves as a record of services personally performed by Gardiner Sleeper, MD, PhD. It was created on their behalf by Leonie Douglas, an ophthalmic technician. The creation of this record is the provider's dictation and/or activities during the visit.    Electronically signed by: Leonie Douglas COA,  05/31/21  8:22 AM   Gardiner Sleeper, M.D., Ph.D. Diseases & Surgery of the Retina and Vitreous [facility] 06/04/2021   Abbreviations: M myopia (nearsighted); A astigmatism; H hyperopia (farsighted); P presbyopia; Mrx spectacle prescription;  CTL contact lenses; OD right eye; OS left eye; OU both eyes  XT exotropia; ET esotropia; PEK punctate epithelial keratitis; PEE punctate epithelial erosions; DES dry eye syndrome; MGD meibomian gland dysfunction; ATs artificial tears; PFAT's preservative free artificial tears; [place] nuclear sclerotic cataract; PSC posterior subcapsular cataract; ERM epi-retinal membrane; PVD posterior vitreous detachment; RD retinal detachment; DM diabetes mellitus; DR diabetic retinopathy; NPDR non-proliferative diabetic retinopathy; PDR proliferative diabetic retinopathy; CSME clinically significant macular edema; DME diabetic macular edema; dbh dot blot hemorrhages; CWS cotton wool spot; POAG primary open angle glaucoma; C/D cup-to-disc ratio; HVF humphrey visual field; GVF goldmann visual field; OCT optical coherence tomography; IOP intraocular pressure; BRVO Branch retinal vein occlusion; CRVO central retinal vein occlusion; CRAO central retinal artery occlusion; BRAO branch retinal artery occlusion; RT retinal tear; SB scleral buckle; PPV pars plana vitrectomy; VH Vitreous hemorrhage; PRP panretinal laser photocoagulation; IVK intravitreal kenalog; VMT vitreomacular traction; MH Macular hole;  NVD neovascularization of the disc; NVE neovascularization elsewhere; AREDS age related eye disease study; ARMD age related macular degeneration; POAG primary open angle glaucoma; EBMD epithelial/anterior basement membrane dystrophy; ACIOL anterior chamber intraocular lens; IOL intraocular lens; PCIOL posterior chamber intraocular lens; Phaco/IOL phacoemulsification with intraocular lens placement; [place] photorefractive keratectomy; LASIK laser assisted in situ  keratomileusis; HTN hypertension; DM diabetes mellitus; COPD chronic obstructive pulmonary disease

## 2021-06-01 LAB — CULTURE, BLOOD (ROUTINE X 2)
Culture: NO GROWTH
Culture: NO GROWTH
Special Requests: ADEQUATE
Special Requests: ADEQUATE

## 2021-06-02 ENCOUNTER — Ambulatory Visit (INDEPENDENT_AMBULATORY_CARE_PROVIDER_SITE_OTHER): Payer: Medicaid Other | Admitting: Podiatry

## 2021-06-02 ENCOUNTER — Other Ambulatory Visit: Payer: Self-pay

## 2021-06-02 ENCOUNTER — Ambulatory Visit (INDEPENDENT_AMBULATORY_CARE_PROVIDER_SITE_OTHER): Payer: Medicaid Other

## 2021-06-02 DIAGNOSIS — Z89419 Acquired absence of unspecified great toe: Secondary | ICD-10-CM

## 2021-06-02 LAB — AEROBIC/ANAEROBIC CULTURE W GRAM STAIN (SURGICAL/DEEP WOUND): Culture: NORMAL

## 2021-06-02 LAB — SURGICAL PATHOLOGY

## 2021-06-02 NOTE — Progress Notes (Signed)
Subjective:  Patient ID: [name], female    DOB: [birth year],  MRN: [id number]  No chief complaint on file.   DOS: 05/28/2021 Procedure: Right partial hallux amputation  50 y.o. female returns for post-op check.  Patient states she is doing well.  Her pain is tolerated.  She is ambulating in Darco wedge shoe.  She denies any other acute complaints bandages are clean dry and intact.  She is a diabetic.  Review of Systems: Negative except as noted in the HPI. Denies N/V/F/Ch.  Past Medical History:  Diagnosis Date   Arthritis    Asthma    Cataract    Mixed form OU   CKD (chronic kidney disease)    Coronary artery disease    Diabetes mellitus    Diabetic retinopathy ([place])    NPDR OU   Hyperlipidemia    Hypertension    Hypertensive retinopathy    OU   Left thyroid nodule    diagnosed 07/2018   PAC (premature atrial contraction) 02/15/2021   Pseudotumor cerebri    Stroke [facility])    Vitamin D deficiency     Current Outpatient Medications:    Accu-Chek FastClix Lancets MISC, 4 (four) times daily., Disp: , Rfl:    ACCU-CHEK GUIDE test strip, 1 each by Other route See admin instructions. for testing, Disp: 100 each, Rfl: 2   albuterol (VENTOLIN HFA) 108 (90 Base) MCG/ACT inhaler, Inhale into the lungs every 6 (six) hours as needed for wheezing or shortness of breath., Disp: , Rfl:    amLODipine (NORVASC) 10 MG tablet, Take 1 tablet (10 mg total) by mouth daily., Disp: 30 tablet, Rfl: 0   aspirin 81 MG EC tablet, Take 1 tablet (81 mg total) by mouth daily., Disp: 90 tablet, Rfl: 2   blood glucose meter kit and supplies KIT, Dispense based on patient and insurance preference. Use up to four times daily as directed. (FOR ICD-9 250.00, 250.01)., Disp: 1 each, Rfl: 0   carvedilol (COREG) 25 MG tablet, Take 1 tablet (25 mg total) by mouth 2 (two) times daily with a meal., Disp: 180 tablet, Rfl: 2   Cholecalciferol (VITAMIN D3) 25 MCG (1000 UT) CAPS, Take 1 capsule by mouth daily.,  Disp: , Rfl:    folic acid (FOLVITE) 1 MG tablet, Take 1 mg by mouth daily., Disp: , Rfl:    glipiZIDE (GLUCOTROL) 5 MG tablet, Take 0.5 tablets (2.5 mg total) by mouth daily before breakfast., Disp: 15 tablet, Rfl: 0   HYDROcodone-acetaminophen (NORCO/VICODIN) 5-325 MG tablet, Take 1 tablet by mouth every 6 (six) hours as needed for up to 7 days for moderate pain., Disp: 28 tablet, Rfl: 0   hydrOXYzine (VISTARIL) 25 MG capsule, Take 25 mg by mouth at bedtime., Disp: , Rfl:    insulin aspart protamine- aspart (NOVOLOG MIX 70/30) (70-30) 100 UNIT/ML injection, Inject 0.15 mLs (15 Units total) into the skin 2 (two) times daily with a meal. (Patient taking differently: Inject 15-17 Units into the skin See admin instructions. Inject 15 units subcutaneous in the morning, then inject 17 units subcutaneous in the evening), Disp: 10 mL, Rfl: 11   Insulin Syringes, Disposable, U-100 1 ML MISC, 1 application by Does not apply route 2 (two) times a day., Disp: 100 each, Rfl: 0   ipratropium-albuterol (DUONEB) 0.5-2.5 (3) MG/3ML SOLN, Take 3 mLs by nebulization every 4 (four) hours as needed. (Patient taking differently: Take 3 mLs by nebulization every 4 (four) hours as needed (sob/wheezing).), Disp: 360 mL,  Rfl: 0   isosorbide-hydrALAZINE (BIDIL) 20-37.5 MG tablet, Take 2 tablets by mouth 3 (three) times daily., Disp: 540 tablet, Rfl: 3   rosuvastatin (CRESTOR) 40 MG tablet, Take 40 mg by mouth daily., Disp: , Rfl:    vitamin B-12 (CYANOCOBALAMIN) 1000 MCG tablet, Take 1,000 mcg by mouth daily., Disp: , Rfl:   Social History   Tobacco Use  Smoking Status Former   Packs/day: 0.50   Years: 30.00   Pack years: 15.00   Types: Cigarettes   Quit date: 2017   Years since quitting: 5.8  Smokeless Tobacco Never    Allergies  Allergen Reactions   Bee Pollen     Seasonal Allergies   Dust Mite Extract     Sneezing & Cough   Objective:  There were no vitals filed for this visit. There is no height or  weight on file to calculate BMI. Constitutional Well developed. Well nourished.  Vascular Foot warm and well perfused. Capillary refill normal to all digits.   Neurologic Normal speech. Oriented to person, place, and time. Epicritic sensation to light touch grossly present bilaterally.  Dermatologic Skin healing well without signs of infection. Skin edges well coapted without signs of infection.  Orthopedic: Tenderness to palpation noted about the surgical site.   Radiographs: 3 views of skeletally mature the right foot: Distal phalanx amputation noted to the right foot.  No osteomyelitic changes noted to the proximal phalanx. Assessment:   1. History of amputation of hallux [facility])    Plan:  Patient was evaluated and treated and all questions answered.  S/p foot surgery right -Progressing as expected post-operatively. -XR: See below -WB Status: Weightbearing as tolerated in Darco wedge shoe -Sutures: Intact.  No clinical signs of dehiscence noted no complication noted. -Medications: None -Foot redressed.  No follow-ups on file.

## 2021-06-03 ENCOUNTER — Encounter: Payer: Medicaid Other | Admitting: Physical Therapy

## 2021-06-04 ENCOUNTER — Encounter (INDEPENDENT_AMBULATORY_CARE_PROVIDER_SITE_OTHER): Payer: Medicaid Other | Admitting: Ophthalmology

## 2021-06-06 ENCOUNTER — Other Ambulatory Visit: Payer: Medicaid Other

## 2021-06-07 ENCOUNTER — Ambulatory Visit: Payer: Medicaid Other | Admitting: Neurology

## 2021-06-09 ENCOUNTER — Ambulatory Visit: Payer: Medicaid Other | Attending: Podiatry

## 2021-06-09 ENCOUNTER — Telehealth: Payer: Self-pay

## 2021-06-09 DIAGNOSIS — M6281 Muscle weakness (generalized): Secondary | ICD-10-CM | POA: Insufficient documentation

## 2021-06-09 DIAGNOSIS — R2689 Other abnormalities of gait and mobility: Secondary | ICD-10-CM | POA: Insufficient documentation

## 2021-06-09 DIAGNOSIS — M25572 Pain in left ankle and joints of left foot: Secondary | ICD-10-CM | POA: Insufficient documentation

## 2021-06-09 NOTE — Telephone Encounter (Signed)
PT called and left voicemail regarding missed visit. Left reminder of attendance policy.  Also left suggestion that we could reschedule next visit for after her follow up with Dr. Posey Pronto on 06/16/21.  Ward Chatters, PT, DPT 06/09/21 5:42 PM

## 2021-06-14 ENCOUNTER — Ambulatory Visit: Payer: Medicaid Other

## 2021-06-16 ENCOUNTER — Encounter: Payer: Medicaid Other | Admitting: Podiatry

## 2021-06-23 ENCOUNTER — Ambulatory Visit (INDEPENDENT_AMBULATORY_CARE_PROVIDER_SITE_OTHER): Payer: Medicaid Other | Admitting: Podiatry

## 2021-06-23 ENCOUNTER — Other Ambulatory Visit: Payer: Self-pay

## 2021-06-23 DIAGNOSIS — Z89419 Acquired absence of unspecified great toe: Secondary | ICD-10-CM | POA: Diagnosis not present

## 2021-06-25 NOTE — Progress Notes (Signed)
Subjective:  Patient ID: [name], female    DOB: 1971-03-04,  MRN: [id number]  Chief Complaint  Patient presents with   Routine Post Op    POV#2 DOS 05/28/21 PARTIAL AMPUTATION RIGHT HALLUX TOE //POV #5 DOS 02/19/2021 LT GASTROCNEMIUS RECESSION W/PRIMARY REPAIR OF ACHILLES TENDON     DOS: 05/28/2021 Procedure: Right partial hallux amputation  50 y.o. female returns for post-op check.  Patient states she is doing well.She is doing well.  She can start transition to regular shoes.  She is a diabetic.  No pain.  T.  She is a diabetic.  Review of Systems: Negative except as noted in the HPI. Denies N/V/F/Ch.  Past Medical History:  Diagnosis Date   Arthritis    Asthma    Cataract    Mixed form OU   CKD (chronic kidney disease)    Coronary artery disease    Diabetes mellitus    Diabetic retinopathy ([place])    NPDR OU   Hyperlipidemia    Hypertension    Hypertensive retinopathy    OU   Left thyroid nodule    diagnosed 07/2018   PAC (premature atrial contraction) 02/15/2021   Pseudotumor cerebri    Stroke [facility])    Vitamin D deficiency     Current Outpatient Medications:    Accu-Chek FastClix Lancets MISC, 4 (four) times daily., Disp: , Rfl:    ACCU-CHEK GUIDE test strip, 1 each by Other route See admin instructions. for testing, Disp: 100 each, Rfl: 2   albuterol (VENTOLIN HFA) 108 (90 Base) MCG/ACT inhaler, Inhale into the lungs every 6 (six) hours as needed for wheezing or shortness of breath., Disp: , Rfl:    amLODipine (NORVASC) 10 MG tablet, Take 1 tablet (10 mg total) by mouth daily., Disp: 30 tablet, Rfl: 0   aspirin 81 MG EC tablet, Take 1 tablet (81 mg total) by mouth daily., Disp: 90 tablet, Rfl: 2   blood glucose meter kit and supplies KIT, Dispense based on patient and insurance preference. Use up to four times daily as directed. (FOR ICD-9 250.00, 250.01)., Disp: 1 each, Rfl: 0   carvedilol (COREG) 25 MG tablet, Take 1 tablet (25 mg total) by mouth 2 (two)  times daily with a meal., Disp: 180 tablet, Rfl: 2   Cholecalciferol (VITAMIN D3) 25 MCG (1000 UT) CAPS, Take 1 capsule by mouth daily., Disp: , Rfl:    folic acid (FOLVITE) 1 MG tablet, Take 1 mg by mouth daily., Disp: , Rfl:    glipiZIDE (GLUCOTROL) 5 MG tablet, Take 0.5 tablets (2.5 mg total) by mouth daily before breakfast., Disp: 15 tablet, Rfl: 0   hydrOXYzine (VISTARIL) 25 MG capsule, Take 25 mg by mouth at bedtime., Disp: , Rfl:    insulin aspart protamine- aspart (NOVOLOG MIX 70/30) (70-30) 100 UNIT/ML injection, Inject 0.15 mLs (15 Units total) into the skin 2 (two) times daily with a meal. (Patient taking differently: Inject 15-17 Units into the skin See admin instructions. Inject 15 units subcutaneous in the morning, then inject 17 units subcutaneous in the evening), Disp: 10 mL, Rfl: 11   Insulin Syringes, Disposable, U-100 1 ML MISC, 1 application by Does not apply route 2 (two) times a day., Disp: 100 each, Rfl: 0   ipratropium-albuterol (DUONEB) 0.5-2.5 (3) MG/3ML SOLN, Take 3 mLs by nebulization every 4 (four) hours as needed. (Patient taking differently: Take 3 mLs by nebulization every 4 (four) hours as needed (sob/wheezing).), Disp: 360 mL, Rfl: 0   isosorbide-hydrALAZINE (  BIDIL) 20-37.5 MG tablet, Take 2 tablets by mouth 3 (three) times daily., Disp: 540 tablet, Rfl: 3   rosuvastatin (CRESTOR) 40 MG tablet, Take 40 mg by mouth daily., Disp: , Rfl:    vitamin B-12 (CYANOCOBALAMIN) 1000 MCG tablet, Take 1,000 mcg by mouth daily., Disp: , Rfl:   Social History   Tobacco Use  Smoking Status Former   Packs/day: 0.50   Years: 30.00   Pack years: 15.00   Types: Cigarettes   Quit date: 2017   Years since quitting: 5.8  Smokeless Tobacco Never    Allergies  Allergen Reactions   Bee Pollen     Seasonal Allergies   Dust Mite Extract     Sneezing & Cough   Objective:  There were no vitals filed for this visit. There is no height or weight on file to calculate  BMI. Constitutional Well developed. Well nourished.  Vascular Foot warm and well perfused. Capillary refill normal to all digits.   Neurologic Normal speech. Oriented to person, place, and time. Epicritic sensation to light touch grossly present bilaterally.  Dermatologic Skin completely epithelialized.  No clinical signs of dehiscence noted.  Orthopedic: No further tenderness to palpation noted about the surgical site.   Radiographs: 3 views of skeletally mature the right foot: Distal phalanx amputation noted to the right foot.  No osteomyelitic changes noted to the proximal phalanx. Assessment:   No diagnosis found.  Plan:  Patient was evaluated and treated and all questions answered.  S/p foot surgery right -Progressing as expected post-operatively. -XR: See below -WB Status: Transition to regular shoes -Sutures: Removed no clinical signs of dehiscence noted no complication noted. -Medications: None -At this time the canal skin continues to stay completely closed.  I will see her back for final follow-up in 6 weeks.  If any foot and ankle issues arise in future of asked her to come see me in the meantime.  No follow-ups on file.

## 2021-07-07 ENCOUNTER — Ambulatory Visit: Payer: Medicaid Other

## 2021-07-07 ENCOUNTER — Other Ambulatory Visit: Payer: Self-pay

## 2021-07-07 DIAGNOSIS — R2689 Other abnormalities of gait and mobility: Secondary | ICD-10-CM

## 2021-07-07 DIAGNOSIS — M25572 Pain in left ankle and joints of left foot: Secondary | ICD-10-CM

## 2021-07-07 DIAGNOSIS — M6281 Muscle weakness (generalized): Secondary | ICD-10-CM | POA: Diagnosis present

## 2021-07-07 NOTE — Therapy (Signed)
[place], [place], [address] Phone: [phone]   Fax:  [phone]  Physical Therapy Treatment/Re-Evaluation  Patient Details  Name: [name] MRN: [id number] Date of Birth: 10/30/1970 Referring Provider (PT): Felipa Furnace, [place]   Encounter Date: 07/07/2021   PT End of Session - 07/07/21 1135     Visit Number 9    Number of Visits 24    Date for PT Re-Evaluation 08/07/21    Authorization Type [employer] 05/03/21-07/23/21- 24 visits    PT Start Time 1135    PT Stop Time 1210    PT Time Calculation (min) 35 min    Activity Tolerance Patient tolerated treatment well    Behavior During Therapy [facility] for tasks assessed/performed             Past Medical History:  Diagnosis Date   Arthritis    Asthma    Cataract    Mixed form OU   CKD (chronic kidney disease)    Coronary artery disease    Diabetes mellitus    Diabetic retinopathy ([place])    NPDR OU   Hyperlipidemia    Hypertension    Hypertensive retinopathy    OU   Left thyroid nodule    diagnosed 07/2018   PAC (premature atrial contraction) 02/15/2021   Pseudotumor cerebri    Stroke ([place])    Vitamin D deficiency     Past Surgical History:  Procedure Laterality Date   ACHILLES TENDON REPAIR     ACHILLES TENDON SURGERY Left 02/19/2021   Procedure: ACHILLES TENDON REPAIR WITH GRAFT;  Surgeon: Felipa Furnace, DPM;  Location: [place];  Service: Podiatry;  Laterality: Left;  BLOCK   AMPUTATION TOE Right 05/28/2021   Procedure: AMPUTATION TOE;  Surgeon: Lorenda Peck, MD;  Location: [place];  Service: Podiatry;  Laterality: Right;  Surgical team will do block   GASTROC RECESSION EXTREMITY Left 02/19/2021   Procedure: GASTROC RECESSION EXTREMITY;  Surgeon: Felipa Furnace, DPM;  Location: [place];  Service: Podiatry;  Laterality: Left;  Block   TUBAL LIGATION      There were no vitals filed for  this visit.   Subjective Assessment - 07/07/21 1135     Subjective Pt presents after few week absence from PT secondary to R great toe amputation. She notes that she has had many falls since her last session, is unsure if this is more because of her R foot or achilles repair on L. She also notes pain in R foot after amputation, denies pain in L foot. Pt remains in post-op shoe, which she will be able take off in 2 weeks. She also reports that she has not been able to be complete HEP secondary to moving into her sisters house and being busy with that.    Patient Stated Goals pt wants to decrease fall risk and improve balance    Currently in Pain? Yes    Pain Score 7     Pain Location Foot    Pain Orientation Right           [facility] Adult PT Treatment/Exercise:   Therapeutic Exercise:  Seated heel raises 2x20 L L ankle PF blue tband x 15 Standing calf stretch x 30" L  Manual Therapy: N/A   Neuromuscular re-ed: N/A   Therapeutic Activity: Assessment of tests/measures, outcomes, and goals for purposes of reassessment and progress post change in status   Modalities: N/A  Self Care: N/A   Consider / progression for next session:      [facility] PT Assessment - 07/07/21 0001       Balance Screen   Has the patient fallen in the past 6 months Yes    How many times? numerous - since R great toe amputation    Has the patient had a decrease in activity level because of a fear of falling?  Yes    Is the patient reluctant to leave their home because of a fear of falling?  Yes      [place];Other relatives    Type of [place] Access Level entry    Home Layout Two level    Alternate Level Stairs-Number of Steps >10    Alternate Level Stairs-Rails Left   ascending                                     PT Short Term Goals - 05/26/21 0927       PT SHORT TERM GOAL #1    Title Pt will be compliant and knowledgeable with initial HEP for improved carryover    Baseline initial HEP given; 05/26/21: compliant and independent    Time 3    Period Weeks    Status Achieved    Target Date 05/18/21               PT Long Term Goals - 07/07/21 1146       PT LONG TERM GOAL #1   Title Pt will improve LEFS score to no less than 45/80 as proxy for functional improvement    Baseline 34/80 pn 11/30    Time 12    Period Weeks    Status Revised    Target Date 08/07/21      PT LONG TERM GOAL #2   Title Pt wil decrease 5xSTS to no greater than 18 seconds for improved functional mobility and LE strength    Baseline 25 sec on 11/30 w/ UE support    Time 12    Period Weeks    Status Revised    Target Date 08/07/21      PT LONG TERM GOAL #3   Title Pt will hold L SLS to 20 sec for improved balance and safety    Baseline L SLS 4"    Time 12    Period Weeks    Status On-going    Target Date 08/07/21      PT LONG TERM GOAL #4   Title Pt will improve TUG to no greater than 12 seconds for improved balance and decreased fall risk    Baseline 17" on 11/30 w/ LoB    Time 12    Period Weeks    Status Revised    Target Date 08/07/21                   Plan - 07/07/21 1340     Clinical Impression Statement Pt presented to PT after multiple week absence after R hallux amputation on 05/27/21 with reports of significant R foot pain and multiple falls. She continues to show signficant decrease in functional mobility and balance after L achilles repair, along with compounding new deficits after recent amputation. Due to her high fall risk related to TUG and 5xSTS measures, she continues  to benefit and require skilled PT services in order to improve safety/balance with gait and mobility. PT will continue to assess status of R foot, as she has follow up visit with DPM and possible removal of post-op shoe.    Personal Factors and Comorbidities Comorbidity  3+;Fitness;Past/Current Experience;Transportation;Finances    Comorbidities PMH: CVA, DM II, Diabetic retinopathy, HTN, CKD    Examination-Activity Limitations Stand;Stairs;Lift;Squat;Sit;Locomotion Level;Transfers    Examination-Participation Restrictions Yard Work;Community Activity;Driving;Volunteer    PT Frequency 2x / week    PT Duration 4 weeks    PT Treatment/Interventions ADLs/Self Care Home Management;Electrical Stimulation;Cryotherapy;Gait training;Stair training;Functional mobility training;Therapeutic activities;Therapeutic exercise;Balance training;Neuromuscular re-education;Patient/family education;Manual techniques;Vasopneumatic Device;Taping    PT Next Visit Plan assess HEP response, work on balance due to numerous falls; progress as able    PT Home Exercise Plan Access Code: [email or web address]    Consulted and Agree with Plan of Care Patient             Patient will benefit from skilled therapeutic intervention in order to improve the following deficits and impairments:  Abnormal gait, Decreased activity tolerance, Decreased balance, Decreased endurance, Decreased mobility, Decreased range of motion, Decreased strength, Difficulty walking, Pain  Visit Diagnosis: Muscle weakness (generalized)  Pain in left ankle and joints of left foot  Other abnormalities of gait and mobility     Problem List Patient Active Problem List   Diagnosis Date Noted   Chronic osteomyelitis of right foot with draining sinus (HCC)    Influenza A    Diabetic foot ulcer ([place]) 05/27/2021   PAC (premature atrial contraction) 02/15/2021   Symptomatic anemia 06/25/2020   Chronic heart failure with preserved ejection fraction ([place]) 06/25/2020   Educated about COVID-19 virus infection 04/29/2020   Class 2 severe obesity with serious comorbidity and body mass index (BMI) of 36.0 to 36.9 in adult ([place]) 03/30/2020   NSVT (nonsustained ventricular tachycardia) 11/13/2019   Pericardial effusion 09/17/2019    H/O: stroke 04/09/2019   Left ventricular hypertrophy 04/09/2019   Mixed hyperlipidemia 04/08/2019   Uncontrolled type 2 diabetes mellitus with hyperglycemia ([place]) 04/08/2019   Non compliance w medication regimen 02/20/2019   Diabetes mellitus type 2 in obese ([place])    Resistant hypertension    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 [place], [place], [address] Phone: [phone]   Fax:  [phone]  Name: [name] MRN: [id number] Date of Birth: 1970/10/14  Check all possible CPT codes: 01314- Therapeutic Exercise, [phone]- Neuro Re-education, [phone] - Gait Training, [phone] - Manual Therapy, [phone] - Therapeutic Activities, and [phone] - Self Care

## 2021-07-20 ENCOUNTER — Ambulatory Visit: Payer: Medicaid Other | Admitting: Neurology

## 2021-07-20 ENCOUNTER — Encounter: Payer: Self-pay | Admitting: Neurology

## 2021-07-20 VITALS — BP 161/79 | HR 80 | Ht 69.0 in | Wt 254.0 lb

## 2021-07-20 DIAGNOSIS — G3184 Mild cognitive impairment, so stated: Secondary | ICD-10-CM | POA: Diagnosis not present

## 2021-07-20 DIAGNOSIS — Z8673 Personal history of transient ischemic attack (TIA), and cerebral infarction without residual deficits: Secondary | ICD-10-CM | POA: Diagnosis not present

## 2021-07-20 NOTE — Progress Notes (Signed)
[facility] [address] [place]. [place] [address] [phone]       OFFICE FOLLOW-UP NOTE  [name] Date of Birth:  November 05, 1970 Medical Record Number:  [id number]   HPI: Initial video visit 07/24/2019 : [name] is a 50 year old African-American lady seen today for initial virtual video consultation visit for stroke and memory loss.  History is obtained from the patient, review of electronic medical records and referral notes and I personally reviewed imaging films in PACS.  She developed sudden onset of facial numbness as well as right-sided weakness and gait difficulty on 02/12/2019 and was admitted to [facility].  She presented beyond time window for thrombolysis.  MRI scan of the brain showed a small midbrain and brainstem lacunar infarct.  CT angiogram of the brain and neck both did not reveal significant large vessel stenosis or occlusion.  Transthoracic echo showed normal ejection fraction.  Hemoglobin A1c was elevated at 8.8 and LDL cholesterol was 57 mg percent.  Patient is started on dual antiplatelet therapy and advised aggressive risk factor modification and treatment for sleep apnea with CPAP.  Patient improved gradually with physical occupational therapy was initially walking with a cane and now she can walk independently.  She still has some mild residual right facial numbness but right-sided strength has improved.  She walks independently indoors and short distances and uses cane only for long distances.  The patient has not had any recurrent stroke or TIA symptoms since then.  She states her blood pressure is well controlled and sugars are also doing better.  She however has not been able to tolerate her CPAP ever since Covid pandemic began and has not seen a medical doctor for troubleshooting this.  She however is concerned today about memory and cognitive difficulties that she has been having ever since the stroke.  She states that she has  trouble finding words and at times completing sentences.  She may remember the words later on.  She denies feeling depressed or anxious.  She denies headache or any other new neurological symptoms.  She is still independent and manages all her affairs.  There is strong family history of multiple family members with memory problems as well as strokes.  There is no history of significant head injury with loss of consciousness, seizures or drug abuse.  She has not had any recent work-up for her memory loss issues on her brain imaging study Update 09/16/2019 : She returns for follow-up after last video visit 2 months ago.  She continues to have short-term memory difficulties but feels its not as bad.  She has learned to strategize and write things down which seems to be helping.  At last visit she had complained of new memory issues & obtained lab work for reversible causes of memory loss and vitamin B12, TSH and RPR were negative on 07/31/2019.Marland Kitchen  EEG on 08/19/2019 was normal.  MRI scan of the brain on 08/21/2019 shows a faintly enhancing right corpus callosal and parasagittal frontal subacute infarct.  Patient had previously in July 2020 had extensive stroke work-up which was negative.  Patient states she has since started using CPAP regularly after she saw her primary care physician recently.  She has been having problems with constipation and has also had alternate watery diarrhea and she is seeing her primary care physician for the same.  She remains on aspirin for stroke prevention which she is tolerating well without bruising or bleeding.  She is tolerating Lipitor  well without muscle aches and pains.  Her blood pressure is usually better controlled to slightly elevated in office today.   Update 05/28/2020 : She returns for follow-up after last visit 8 months ago.  She has not had no further stroke or TIA symptoms.  She states her memory is also fine.  Headaches are much improved she did find benefit after doing  stress relaxation activities.  She does virtual reality game playing which helps her relax a lot.  She remains on aspirin which is tolerating well without bruising or bleeding.  Blood pressure is still running high and today it is elevated in office at 176/84.  She is working with the primary care physician to get it under better control.  She states her fasting sugars usually run in the 120s.  She remains on Crestor which is tolerating well without muscle aches and pains.  She states she is eating healthy and has lost about 15 pounds.  She has no new complaints today.  She had echocardiogram done on 10/16/2019 which showed normal ejection fraction.  Transcranial Doppler bubble study done on 09/25/2019 was negative for PFO.  She had 30-day heart monitor from 09/17/2019 to 10/16/2019 which showed no significant arrhythmias or atrial fibrillation.  CT angiogram of the brain and neck done on 10/08/2019 showed no large vessel stenosis or occlusion and only mild atherosclerotic changes.  No significant change compared with previous CTA from July 2020. Update 07/20/2021: She returns for follow-up after last visit to more than a year ago.  Patient states she is doing well without recurrent stroke or TIA symptoms.  She remains on aspirin which she is tolerating well without bleeding or bruising.  Blood pressures under good control.  She is not been checking her sugars regularly as she recently moved and a lot of her stuff is still in storage.  She continues to have short-term memory difficulties and needs to remind herself constantly and sometimes forgets recent information.  However on the Mini-Mental status exam today she scored 30/30 in the office and was able to name 14 animals and clock drawing was normal.  She states she is not had any headaches of late.  She has no new complaints. ROS:   14 system review of systems is positive for  Headache, stress, memory impairment, disturbed sleep, weakness and all other systems  negative PMH:  Past Medical History:  Diagnosis Date   Arthritis    Asthma    Cataract    Mixed form OU   CKD (chronic kidney disease)    Coronary artery disease    Diabetes mellitus    Diabetic retinopathy (HCC)    NPDR OU   Hyperlipidemia    Hypertension    Hypertensive retinopathy    OU   Left thyroid nodule    diagnosed 07/2018   PAC (premature atrial contraction) 02/15/2021   Pseudotumor cerebri    Stroke (HCC)    Vitamin D deficiency     Social History:  Social History   Socioeconomic History   Marital status: Single    Spouse name: Not on file   Number of children: 4   Years of education: Not on file   Highest education level: Not on file  Occupational History   Occupation: unemployed  Tobacco Use   Smoking status: Former    Packs/day: 0.50    Years: 30.00    Pack years: 15.00    Types: Cigarettes    Quit date: 2017    Years  since quitting: 5.9   Smokeless tobacco: Never  Vaping Use   Vaping Use: Never used  Substance and Sexual Activity   Alcohol use: Not Currently    Alcohol/week: 0.0 standard drinks    Comment: rare   Drug use: Yes    Types: Marijuana    Comment: daily   Sexual activity: Not Currently    Partners: Male    Birth control/protection: Surgical  Other Topics Concern   Not on file  Social History Narrative   Lives with 3 kids   Right handed   Drinks 2 cups caffeine daily   Social Determinants of Health   Financial Resource Strain: Not on file  Food Insecurity: Food Insecurity Present   Worried About [place] in the Last Year: Sometimes true   Ran Out of Food in the Last Year: Never true  Transportation Needs: No Transportation Needs   Lack of Transportation (Medical): No   Lack of Transportation (Non-Medical): No  Physical Activity: Inactive   Days of Exercise per Week: 0 days   Minutes of Exercise per Session: 0 min  Stress: Stress Concern Present   Feeling of Stress : Very much  Social Connections:  Moderately Isolated   Frequency of Communication with Friends and Family: Three times a week   Frequency of Social Gatherings with Friends and Family: Three times a week   Attends Religious Services: More than 4 times per year   Active Member of Clubs or Organizations: No   Attends Archivist Meetings: Never   Marital Status: Never married  Human resources officer Violence: Not At Risk   Fear of Current or Ex-Partner: No   Emotionally Abused: No   Physically Abused: No   Sexually Abused: No    Medications:   Current Outpatient Medications on File Prior to Visit  Medication Sig Dispense Refill   Accu-Chek FastClix Lancets MISC 4 (four) times daily.     ACCU-CHEK GUIDE test strip 1 each by Other route See admin instructions. for testing 100 each 2   albuterol (VENTOLIN HFA) 108 (90 Base) MCG/ACT inhaler Inhale into the lungs every 6 (six) hours as needed for wheezing or shortness of breath.     amLODipine (NORVASC) 10 MG tablet Take 1 tablet (10 mg total) by mouth daily. 30 tablet 0   aspirin 81 MG EC tablet Take 1 tablet (81 mg total) by mouth daily. 90 tablet 2   blood glucose meter kit and supplies KIT Dispense based on patient and insurance preference. Use up to four times daily as directed. (FOR ICD-9 250.00, 250.01). 1 each 0   carvedilol (COREG) 25 MG tablet Take 1 tablet (25 mg total) by mouth 2 (two) times daily with a meal. 180 tablet 2   Cholecalciferol (VITAMIN D3) 25 MCG (1000 UT) CAPS Take 1 capsule by mouth daily.     folic acid (FOLVITE) 1 MG tablet Take 1 mg by mouth daily.     glipiZIDE (GLUCOTROL) 5 MG tablet Take 0.5 tablets (2.5 mg total) by mouth daily before breakfast. 15 tablet 0   hydrOXYzine (VISTARIL) 25 MG capsule Take 25 mg by mouth at bedtime.     insulin aspart protamine- aspart (NOVOLOG MIX 70/30) (70-30) 100 UNIT/ML injection Inject 0.15 mLs (15 Units total) into the skin 2 (two) times daily with a meal. (Patient taking differently: Inject 15-17 Units  into the skin See admin instructions. Inject 15 units subcutaneous in the morning, then inject 17 units subcutaneous in the evening)  10 mL 11   Insulin Syringes, Disposable, Q-034 1 ML MISC 1 application by Does not apply route 2 (two) times a day. 100 each 0   ipratropium-albuterol (DUONEB) 0.5-2.5 (3) MG/3ML SOLN Take 3 mLs by nebulization every 4 (four) hours as needed. (Patient taking differently: Take 3 mLs by nebulization every 4 (four) hours as needed (sob/wheezing).) 360 mL 0   isosorbide-hydrALAZINE (BIDIL) 20-37.5 MG tablet Take 2 tablets by mouth 3 (three) times daily. 540 tablet 3   rosuvastatin (CRESTOR) 40 MG tablet Take 40 mg by mouth daily.     vitamin B-12 (CYANOCOBALAMIN) 1000 MCG tablet Take 1,000 mcg by mouth daily.     No current facility-administered medications on file prior to visit.    Allergies:   Allergies  Allergen Reactions   Bee Pollen     Seasonal Allergies   Dust Mite Extract     Sneezing & Cough    Physical Exam General: Obese middle-aged African-American lady seated, in no evident distress Head: head normocephalic and atraumatic.  Neck: supple with no carotid or supraclavicular bruits Cardiovascular: regular rate and rhythm, no murmurs Musculoskeletal: no deformity Skin:  no rash/petichiae Vascular:  Normal pulses all extremities Vitals:   07/20/21 1456  BP: (!) 161/79  Pulse: 80   Neurologic Exam Mental Status: Awake and fully alert. Oriented to place and time. Recent and remote memory intact. Attention span, concentration and fund of knowledge appropriate. Mood and affect appropriate.  Recall 3/3.Marland Kitchen  Mini-Mental status exam 30/30 Clock drawing 4/4. Animal Naming 14. cranial Nerves: Fundoscopic exam not done. Pupils equal, briskly reactive to light. Extraocular movements full without nystagmus. Visual fields full to confrontation. Hearing intact. Facial sensation intact. Face, tongue, palate moves normally and symmetrically.  Motor: Normal bulk and  tone. Normal strength in all tested extremity muscles. Sensory.: intact to touch ,pinprick .position and vibratory sensation.  Coordination: Rapid alternating movements normal in all extremities. Finger-to-nose and heel-to-shin performed accurately bilaterally. Gait and Station: Arises from chair without difficulty. Stance is normal. Gait demonstrates normal stride length and balance . Able to heel, toe and tandem walk with moderate difficulty.  Mildly unsteady on a narrow base.  Difficulties standing on the toes and heel.   Reflexes: 1+ and symmetric except ankle jerks are depressed. Toes downgoing.    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  She is stable from neurovascular standpoint.  2.  Mild complaints of memory difficulties likely due to mild cognitive impairment also stable.  3. Mild tension headaches which appear resolved as well   PLAN: I had a long d/w patient about her remote stroke,mild cognitive impairment, risk for recurrent stroke/TIAs, personally independently reviewed imaging studies and stroke evaluation results and answered questions.Continue aspirin 81 mg daily  for secondary stroke prevention and maintain strict control of hypertension with blood pressure goal below 130/90, diabetes with hemoglobin A1c goal below  6.5% and lipids with LDL cholesterol goal below 70 mg/dL. I also advised the patient to eat a healthy diet  with plenty of whole grains, cereals, fruits and vegetables, exercise regularly and maintain ideal body weight .continue increase participation in cognitively challenging activities like solving crossword puzzles, playing bridge and sodoku.  We discussed also memory compensation strategies.  Followup in the future with my nurse practitioner in 6 months or call earlier if necessary.Greater than 50% of time during this 30 minute visit was spent on counseling,explanation of diagnosis of cryptogenic stroke, mild cognitive impairment, vascular, and the right the patient that lady will get and answering questions planning of further management, discussion with patient and family and coordination of care Antony Contras, MD  [facility] [address] [place], [place] [address]  Phone [phone] Fax [phone] Note: This document was prepared with digital dictation and possible smart phrase technology. Any transcriptional errors that result from this process are unintentional

## 2021-07-20 NOTE — Patient Instructions (Signed)
I had a long d/w patient about her remote stroke,mild cognitive impairment, risk for recurrent stroke/TIAs, personally independently reviewed imaging studies and stroke evaluation results and answered questions.Continue aspirin 81 mg daily  for secondary stroke prevention and maintain strict control of hypertension with blood pressure goal below 130/90, diabetes with hemoglobin A1c goal below 6.5% and lipids with LDL cholesterol goal below 70 mg/dL. I also advised the patient to eat a healthy diet with plenty of whole grains, cereals, fruits and vegetables, exercise regularly and maintain ideal body weight .continue increase participation in cognitively challenging activities like solving crossword puzzles, playing bridge and sodoku.  We discussed also memory compensation strategies.  Followup in the future with my nurse practitioner in 6 months or call earlier if necessary. Memory Compensation Strategies  Use "WARM" strategy.  W= write it down  A= associate it  R= repeat it  M= make a mental note  2.   You can keep a Social worker.  Use a 3-ring notebook with sections for the following: calendar, important names and phone numbers,  medications, doctors' names/phone numbers, lists/reminders, and a section to journal what you did  each day.   3.    Use a calendar to write appointments down.  4.    Write yourself a schedule for the day.  This can be placed on the calendar or in a separate section of the Memory Notebook.  Keeping a  regular schedule can help memory.  5.    Use medication organizer with sections for each day or morning/evening pills.  You may need help loading it  6.    Keep a basket, or pegboard by the door.  Place items that you need to take out with you in the basket or on the pegboard.  You may also want to  include a message board for reminders.  7.    Use sticky notes.  Place sticky notes with reminders in a place where the task is performed.  For example: " turn off the   stove" placed by the stove, "lock the door" placed on the door at eye level, " take your medications" on  the bathroom mirror or by the place where you normally take your medications.  8.    Use alarms/timers.  Use while cooking to remind yourself to check on food or as a reminder to take your medicine, or as a  reminder to make a call, or as a reminder to perform another task, etc.

## 2021-07-21 ENCOUNTER — Emergency Department (HOSPITAL_COMMUNITY): Payer: Medicaid Other

## 2021-07-21 ENCOUNTER — Encounter: Payer: Self-pay | Admitting: Podiatry

## 2021-07-21 ENCOUNTER — Encounter (HOSPITAL_COMMUNITY): Payer: Self-pay

## 2021-07-21 ENCOUNTER — Emergency Department (HOSPITAL_COMMUNITY)
Admission: EM | Admit: 2021-07-21 | Discharge: 2021-07-21 | Disposition: A | Discharge status: Home / Self Care | Payer: Medicaid Other | Attending: Emergency Medicine | Admitting: Emergency Medicine

## 2021-07-21 ENCOUNTER — Ambulatory Visit: Payer: Medicaid Other | Admitting: Podiatry

## 2021-07-21 ENCOUNTER — Ambulatory Visit: Payer: Medicaid Other | Attending: Podiatry

## 2021-07-21 ENCOUNTER — Other Ambulatory Visit: Payer: Self-pay

## 2021-07-21 VITALS — BP 187/102 | HR 80

## 2021-07-21 DIAGNOSIS — Z87891 Personal history of nicotine dependence: Secondary | ICD-10-CM | POA: Insufficient documentation

## 2021-07-21 DIAGNOSIS — M25572 Pain in left ankle and joints of left foot: Secondary | ICD-10-CM | POA: Insufficient documentation

## 2021-07-21 DIAGNOSIS — E1142 Type 2 diabetes mellitus with diabetic polyneuropathy: Secondary | ICD-10-CM | POA: Diagnosis not present

## 2021-07-21 DIAGNOSIS — N189 Chronic kidney disease, unspecified: Secondary | ICD-10-CM | POA: Insufficient documentation

## 2021-07-21 DIAGNOSIS — Z794 Long term (current) use of insulin: Secondary | ICD-10-CM

## 2021-07-21 DIAGNOSIS — B351 Tinea unguium: Secondary | ICD-10-CM | POA: Diagnosis not present

## 2021-07-21 DIAGNOSIS — I2510 Atherosclerotic heart disease of native coronary artery without angina pectoris: Secondary | ICD-10-CM | POA: Diagnosis not present

## 2021-07-21 DIAGNOSIS — Z7951 Long term (current) use of inhaled steroids: Secondary | ICD-10-CM | POA: Insufficient documentation

## 2021-07-21 DIAGNOSIS — R0602 Shortness of breath: Secondary | ICD-10-CM | POA: Diagnosis present

## 2021-07-21 DIAGNOSIS — E1122 Type 2 diabetes mellitus with diabetic chronic kidney disease: Secondary | ICD-10-CM | POA: Diagnosis not present

## 2021-07-21 DIAGNOSIS — I509 Heart failure, unspecified: Secondary | ICD-10-CM | POA: Diagnosis not present

## 2021-07-21 DIAGNOSIS — M79675 Pain in left toe(s): Secondary | ICD-10-CM

## 2021-07-21 DIAGNOSIS — D631 Anemia in chronic kidney disease: Secondary | ICD-10-CM | POA: Insufficient documentation

## 2021-07-21 DIAGNOSIS — Z7982 Long term (current) use of aspirin: Secondary | ICD-10-CM | POA: Insufficient documentation

## 2021-07-21 DIAGNOSIS — Z7984 Long term (current) use of oral hypoglycemic drugs: Secondary | ICD-10-CM | POA: Diagnosis not present

## 2021-07-21 DIAGNOSIS — R2689 Other abnormalities of gait and mobility: Secondary | ICD-10-CM | POA: Insufficient documentation

## 2021-07-21 DIAGNOSIS — J45909 Unspecified asthma, uncomplicated: Secondary | ICD-10-CM | POA: Insufficient documentation

## 2021-07-21 DIAGNOSIS — Z79899 Other long term (current) drug therapy: Secondary | ICD-10-CM | POA: Diagnosis not present

## 2021-07-21 DIAGNOSIS — I130 Hypertensive heart and chronic kidney disease with heart failure and stage 1 through stage 4 chronic kidney disease, or unspecified chronic kidney disease: Secondary | ICD-10-CM | POA: Diagnosis not present

## 2021-07-21 DIAGNOSIS — M6281 Muscle weakness (generalized): Secondary | ICD-10-CM | POA: Insufficient documentation

## 2021-07-21 DIAGNOSIS — M79674 Pain in right toe(s): Secondary | ICD-10-CM

## 2021-07-21 LAB — COMPREHENSIVE METABOLIC PANEL
ALT: 19 U/L (ref 0–44)
AST: 19 U/L (ref 15–41)
Albumin: 3.5 g/dL (ref 3.5–5.0)
Alkaline Phosphatase: 71 U/L (ref 38–126)
Anion gap: 10 (ref 5–15)
BUN: 19 mg/dL (ref 6–20)
CO2: 23 mmol/L (ref 22–32)
Calcium: 8.9 mg/dL (ref 8.9–10.3)
Chloride: 106 mmol/L (ref 98–111)
Creatinine, Ser: 1.94 mg/dL — ABNORMAL HIGH (ref 0.44–1.00)
GFR, Estimated: 31 mL/min — ABNORMAL LOW (ref >60)
Glucose, Bld: 119 mg/dL — ABNORMAL HIGH (ref 70–99)
Potassium: 3.7 mmol/L (ref 3.5–5.1)
Sodium: 139 mmol/L (ref 135–145)
Total Bilirubin: 0.6 mg/dL (ref 0.3–1.2)
Total Protein: 7.3 g/dL (ref 6.5–8.1)

## 2021-07-21 LAB — BRAIN NATRIURETIC PEPTIDE: B Natriuretic Peptide: 377.5 pg/mL — ABNORMAL HIGH (ref 0.0–100.0)

## 2021-07-21 LAB — LIPID PANEL
Chol/HDL Ratio: 2.7 ratio (ref 0.0–4.4)
Cholesterol, Total: 88 mg/dL — ABNORMAL LOW (ref 100–199)
HDL: 33 mg/dL — ABNORMAL LOW (ref >39)
LDL Chol Calc (NIH): 29 mg/dL (ref 0–99)
Triglycerides: 153 mg/dL — ABNORMAL HIGH (ref 0–149)
VLDL Cholesterol Cal: 26 mg/dL (ref 5–40)

## 2021-07-21 LAB — CBC WITH DIFFERENTIAL/PLATELET
Abs Immature Granulocytes: 0.04 10*3/uL (ref 0.00–0.07)
Basophils Absolute: 0 10*3/uL (ref 0.0–0.1)
Basophils Relative: 1 %
Eosinophils Absolute: 0.3 10*3/uL (ref 0.0–0.5)
Eosinophils Relative: 4 %
HCT: 33.5 % — ABNORMAL LOW (ref 36.0–46.0)
Hemoglobin: 10.7 g/dL — ABNORMAL LOW (ref 12.0–15.0)
Immature Granulocytes: 1 %
Lymphocytes Relative: 21 %
Lymphs Abs: 1.7 10*3/uL (ref 0.7–4.0)
MCH: 29.5 pg (ref 26.0–34.0)
MCHC: 31.9 g/dL (ref 30.0–36.0)
MCV: 92.3 fL (ref 80.0–100.0)
Monocytes Absolute: 0.5 10*3/uL (ref 0.1–1.0)
Monocytes Relative: 6 %
Neutro Abs: 5.4 10*3/uL (ref 1.7–7.7)
Neutrophils Relative %: 67 %
Platelets: 270 10*3/uL (ref 150–400)
RBC: 3.63 MIL/uL — ABNORMAL LOW (ref 3.87–5.11)
RDW: 14.4 % (ref 11.5–15.5)
WBC: 8 10*3/uL (ref 4.0–10.5)
nRBC: 0 % (ref 0.0–0.2)

## 2021-07-21 LAB — HEMOGLOBIN A1C
Est. average glucose Bld gHb Est-mCnc: 200 mg/dL
Hgb A1c MFr Bld: 8.6 % — ABNORMAL HIGH (ref 4.8–5.6)

## 2021-07-21 LAB — TROPONIN I (HIGH SENSITIVITY)
Troponin I (High Sensitivity): 24 ng/L — ABNORMAL HIGH (ref <18)
Troponin I (High Sensitivity): 26 ng/L — ABNORMAL HIGH (ref <18)

## 2021-07-21 MED ORDER — FUROSEMIDE 40 MG PO TABS: 40.0000 mg | ORAL_TABLET | Freq: Every day | ORAL | 0 refills | Status: DC

## 2021-07-21 MED ORDER — FUROSEMIDE 40 MG PO TABS: 40.0000 mg | ORAL_TABLET | Freq: Every day | ORAL | 11 refills | Status: DC

## 2021-07-21 MED ORDER — FUROSEMIDE 10 MG/ML IJ SOLN
40.0000 mg | Freq: Once | INTRAMUSCULAR | Status: AC
  Administered 2021-07-21: 19:00:00 40 mg via INTRAVENOUS
  Filled 2021-07-21: qty 4

## 2021-07-21 NOTE — Therapy (Addendum)
[place], [place], [address] Phone: [phone]   Fax:  [phone]  Physical Therapy Treatment  Patient Details  Name: [name] MRN: [id number] Date of Birth: 10-06-1970 Referring Provider (PT): Felipa Furnace, [place]   Encounter Date: 07/21/2021   PT End of Session - 07/21/21 1242     Visit Number 9    Number of Visits 24    Date for PT Re-Evaluation 08/07/21    Authorization Type [employer] 05/03/21-07/23/21- 24 visits    PT Start Time 1235    PT Stop Time 1243    PT Time Calculation (min) 8 min             Past Medical History:  Diagnosis Date   Arthritis    Asthma    Cataract    Mixed form OU   CKD (chronic kidney disease)    Coronary artery disease    Diabetes mellitus    Diabetic retinopathy ([place])    NPDR OU   Hyperlipidemia    Hypertension    Hypertensive retinopathy    OU   Left thyroid nodule    diagnosed 07/2018   PAC (premature atrial contraction) 02/15/2021   Pseudotumor cerebri    Stroke [facility])    Vitamin D deficiency     Past Surgical History:  Procedure Laterality Date   ACHILLES TENDON REPAIR     ACHILLES TENDON SURGERY Left 02/19/2021   Procedure: ACHILLES TENDON REPAIR WITH GRAFT;  Surgeon: Felipa Furnace, DPM;  Location: [facility];  Service: Podiatry;  Laterality: Left;  BLOCK   AMPUTATION TOE Right 05/28/2021   Procedure: AMPUTATION TOE;  Surgeon: Lorenda Peck, MD;  Location: [facility];  Service: Podiatry;  Laterality: Right;  Surgical team will do block   GASTROC RECESSION EXTREMITY Left 02/19/2021   Procedure: GASTROC RECESSION EXTREMITY;  Surgeon: Felipa Furnace, DPM;  Location: [facility];  Service: Podiatry;  Laterality: Left;  Block   TUBAL LIGATION      Vitals:   07/21/21 1238  BP: (!) 187/102  Pulse: 80  SpO2: 97%      Patient arrived for PT session with complaints of BLE swelling and shortness of breath  that has been ongoing for the past couple weeks. Patient denies other cardiac symptoms at this time. Patient requested that her vitals be taken before starting session as her sister who is a nurse encouraged to go to the ER for these symptoms. She was noted to have significantly elevated blood pressure and was instructed to immediately go to the ER for further assessment. PT spoke with patient's sister to relay her vitals and recommendation of ER referral at the patient's request who also agreed that patient should go immediately to the ER. Patient verbalized understanding and left session with plans to go immediately to the ER.                                          Visit Diagnosis: No diagnosis found.     Problem List Patient Active Problem List   Diagnosis Date Noted   Chronic osteomyelitis of right foot with draining sinus (HCC)    Influenza A    Diabetic foot ulcer ([place]) 05/27/2021   PAC (premature atrial contraction) 02/15/2021   Symptomatic anemia 06/25/2020   Chronic heart failure with preserved ejection  fraction ([place]) 06/25/2020   Educated about COVID-19 virus infection 04/29/2020   Class 2 severe obesity with serious comorbidity and body mass index (BMI) of 36.0 to 36.9 in adult ([place]) 03/30/2020   NSVT (nonsustained ventricular tachycardia) 11/13/2019   Pericardial effusion 09/17/2019   H/O: stroke 04/09/2019   Left ventricular hypertrophy 04/09/2019   Mixed hyperlipidemia 04/08/2019   Uncontrolled type 2 diabetes mellitus with hyperglycemia ([place]) 04/08/2019   Non compliance w medication regimen 02/20/2019   Diabetes mellitus type 2 in obese ([place])    Resistant hypertension    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[place]) 02/13/2019   Cerebrovascular accident (CVA) ([place])    Hypokalemia    Hypomagnesemia    AKI (acute kidney injury) ([place])    Acute CVA (cerebrovascular accident) ([place]) 02/11/2019   Hypertensive urgency 02/11/2019   ARF (acute renal  failure) ([place]) 02/11/2019   Type 2 diabetes mellitus with vascular disease ([place]) 02/11/2019   Type 2 diabetes with complication ([place]) 43/88/8757   Essential hypertension 01/08/2018   Obstructive sleep apnea 08/26/2017    Glade Lloyd, SPT 07/21/21 12:49 PM   [facility] [address] [place], [place], [address] Phone: [phone]   Fax:  [phone]  Name: [name] MRN: [id number] Date of Birth: [birth year]

## 2021-07-21 NOTE — ED Provider Notes (Signed)
Emergency Medicine Provider Triage Evaluation Note  [name] , a 50 y.o. female  was evaluated in triage.  Pt complains of shortness of breath, leg swelling for a month. Worsened recently. Intermittent chest pain with coughing. None currently. Unsure if she is on a diuretic.  Review of Systems  Positive: Sob, cough, cp Negative: fever  Physical Exam  BP (!) 182/91 (BP Location: Right Arm)    Pulse 78    Temp 98.4 F (36.9 C)    Resp 20    SpO2 100%  Gen:   Awake, no distress   Resp:  Normal effort  MSK:   Moves extremities without difficulty  Other:  Pitting edema to BLE  Medical Decision Making  Medically screening exam initiated at 1:57 PM.  Appropriate orders placed.  [name] was informed that the remainder of the evaluation will be completed by another provider, this initial triage assessment does not replace that evaluation, and the importance of remaining in the ED until their evaluation is complete.  Labs and cxr ordered   Delia Heady, PA-C 07/21/21 1358    Valarie Merino, MD 07/23/21 [phone]

## 2021-07-21 NOTE — ED Triage Notes (Signed)
Patient complains of exertional SOB x 1 week. Has been experiencing edema to lower legs and ankles and hands, also reports abdominal distention since early November. Patient denies CP. Patient has CHF and has been taking lasix daily. Alert and oriented

## 2021-07-21 NOTE — Progress Notes (Signed)
°  Subjective:  Patient ID: [name], female    DOB: Jan 02, 1971,  MRN: [id number]  Chief Complaint  Patient presents with   Routine Post Op    POV#3 DOS 05/28/21 PARTIAL AMPUTATION RIGHT HALLUX TOE //POV #5 DOS 02/19/2021 LT GASTROCNEMIUS RECESSION W/PRIMARY REPAIR OF ACHILLES TENDON   50 y.o. female returns for the above complaint.  Patient presents with thickened elongated dystrophic toenails x9.  Pain on palpation.  Patient would like to have them debrided down.  She has a history of right partial hallux amputation.  She denies any other acute complaints.  She would like to have them debrided down as she is not able to do her self  Objective:  There were no vitals filed for this visit. Podiatric Exam: Vascular: dorsalis pedis and posterior tibial pulses are palpable bilateral. Capillary return is immediate. Temperature gradient is WNL. Skin turgor WNL  Sensorium: Normal Semmes Weinstein monofilament test. Normal tactile sensation bilaterally. Nail Exam: Pt has thick disfigured discolored nails with subungual debris noted bilateral entire nail hallux through fifth toenails.  Pain on palpation to the nails. Ulcer Exam: There is no evidence of ulcer or pre-ulcerative changes or infection. Orthopedic Exam: Muscle tone and strength are WNL. No limitations in general ROM. No crepitus or effusions noted.  Skin: No Porokeratosis. No infection or ulcers    Assessment & Plan:   1. Type 2 diabetes mellitus with diabetic polyneuropathy, with long-term current use of insulin (HCC)   2. Pain due to onychomycosis of toenails of both feet     Patient was evaluated and treated and all questions answered.  Onychomycosis with pain  -Nails palliatively debrided as below. -Educated on self-care  Procedure: Nail Debridement Rationale: pain  Type of Debridement: manual, sharp debridement. Instrumentation: Nail nipper, rotary burr. Number of Nails: 10  Procedures and Treatment: Consent by  patient was obtained for treatment procedures. The patient understood the discussion of treatment and procedures well. All questions were answered thoroughly reviewed. Debridement of mycotic and hypertrophic toenails, 1 through 5 bilateral and clearing of subungual debris. No ulceration, no infection noted.  Return Visit-Office Procedure: Patient instructed to return to the office for a follow up visit 3 months for continued evaluation and treatment.  Boneta Lucks, DPM    Return in about 3 months (around 10/19/2021).

## 2021-07-21 NOTE — ED Provider Notes (Signed)
[place] [facility] EMERGENCY DEPARTMENT Provider Note   CSN: [id number] Arrival date & time: 07/21/21  1257     History Chief Complaint  Patient presents with   Shortness of Breath    [name] is a 50 y.o. female who presents to the ED today with complaints of SOB and LE swelling.  Patient endorses a history of heart failure for which she has been on Lasix in the past.  She states that the shortness of breath, orthopnea, and bilateral leg swelling have been worsening over the past month.  She is able to walk a few feet without having significant shortness of breath.  She has to sleep upright at night due to orthopnea. Her sister over the phone states that she has not taken this for the past week and has been eating salty foods lately.  Denies recent illness.  States feeling "cold" lately denies fevers.  No other complaints or concerns today.  HPI    Past Medical History:  Diagnosis Date   Arthritis    Asthma    Cataract    Mixed form OU   CKD (chronic kidney disease)    Coronary artery disease    Diabetes mellitus    Diabetic retinopathy ([place])    NPDR OU   Hyperlipidemia    Hypertension    Hypertensive retinopathy    OU   Left thyroid nodule    diagnosed 07/2018   PAC (premature atrial contraction) 02/15/2021   Pseudotumor cerebri    Stroke [facility])    Vitamin D deficiency     Patient Active Problem List   Diagnosis Date Noted   Chronic osteomyelitis of right foot with draining sinus ([place])    Influenza A    Diabetic foot ulcer ([place]) 05/27/2021   PAC (premature atrial contraction) 02/15/2021   Symptomatic anemia 06/25/2020   Chronic heart failure with preserved ejection fraction ([place]) 06/25/2020   Educated about COVID-19 virus infection 04/29/2020   Class 2 severe obesity with serious comorbidity and body mass index (BMI) of 36.0 to 36.9 in adult ([place]) 03/30/2020   NSVT (nonsustained ventricular tachycardia) 11/13/2019   Pericardial effusion  09/17/2019   H/O: stroke 04/09/2019   Left ventricular hypertrophy 04/09/2019   Mixed hyperlipidemia 04/08/2019   Uncontrolled type 2 diabetes mellitus with hyperglycemia ([place]) 04/08/2019   Non compliance w medication regimen 02/20/2019   Diabetes mellitus type 2 in obese ([place])    Resistant hypertension    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[place]) 02/13/2019   Cerebrovascular accident (CVA) ([place])    Hypokalemia    Hypomagnesemia    AKI (acute kidney injury) ([place])    Acute CVA (cerebrovascular accident) ([place]) 02/11/2019   Hypertensive urgency 02/11/2019   ARF (acute renal failure) ([place]) 02/11/2019   Type 2 diabetes mellitus with vascular disease ([place]) 02/11/2019   Type 2 diabetes with complication ([place]) 69/79/4801   Essential hypertension 01/08/2018   Obstructive sleep apnea 08/26/2017    Past Surgical History:  Procedure Laterality Date   ACHILLES TENDON REPAIR     ACHILLES TENDON SURGERY Left 02/19/2021   Procedure: ACHILLES TENDON REPAIR WITH GRAFT;  Surgeon: Felipa Furnace, DPM;  Location: [facility];  Service: Podiatry;  Laterality: Left;  BLOCK   AMPUTATION TOE Right 05/28/2021   Procedure: AMPUTATION TOE;  Surgeon: Lorenda Peck, MD;  Location: [facility];  Service: Podiatry;  Laterality: Right;  Surgical team will do block   GASTROC RECESSION EXTREMITY Left 02/19/2021   Procedure: GASTROC RECESSION EXTREMITY;  Surgeon: Felipa Furnace, DPM;  Location: [facility];  Service: Podiatry;  Laterality: Left;  Block   TUBAL LIGATION       OB History     Gravida  4   Para  4   Term  4   Preterm      AB      Living  4      SAB      IAB      Ectopic      Multiple      Live Births              Family History  Problem Relation Age of Onset   Asthma Mother    Diabetes Father    Hypertension Father    Kidney disease Father    Hypertension Sister    Asthma Sister    Congestive Heart Failure Maternal Aunt    Diabetes Maternal Grandmother     Congestive Heart Failure Maternal Grandmother    Lung disease Maternal Grandmother    Asthma Other    Hyperlipidemia Other    Hypertension Other    Cancer Other     Social History   Tobacco Use   Smoking status: Former    Packs/day: 0.50    Years: 30.00    Pack years: 15.00    Types: Cigarettes    Quit date: 2017    Years since quitting: 5.9   Smokeless tobacco: Never  Vaping Use   Vaping Use: Never used  Substance Use Topics   Alcohol use: Not Currently    Alcohol/week: 0.0 standard drinks    Comment: rare   Drug use: Yes    Types: Marijuana    Comment: daily    Home Medications Prior to Admission medications   Medication Sig Start Date End Date Taking? Authorizing Provider  albuterol (VENTOLIN HFA) 108 (90 Base) MCG/ACT inhaler Inhale into the lungs every 6 (six) hours as needed for wheezing or shortness of breath.   Yes [provider]  amLODipine (NORVASC) 10 MG tablet Take 1 tablet (10 mg total) by mouth daily. 05/31/21  Yes Alcus Dad, MD  aspirin 81 MG EC tablet Take 1 tablet (81 mg total) by mouth daily. 10/14/20  Yes Patwardhan, Manish J, MD  carvedilol (COREG) 25 MG tablet Take 1 tablet (25 mg total) by mouth 2 (two) times daily with a meal. 10/14/20  Yes Patwardhan, Manish J, MD  Cholecalciferol (VITAMIN D3) 25 MCG (1000 UT) CAPS Take 1 capsule by mouth daily. 05/28/19  Yes [provider]  ferrous sulfate 325 (65 FE) MG EC tablet Take 325 mg by mouth daily. 04/16/21  Yes [provider]  fluticasone (FLONASE) 50 MCG/ACT nasal spray Place 2 sprays into both nostrils daily as needed for allergies or rhinitis.   Yes [provider]  folic acid (FOLVITE) 1 MG tablet Take 1 mg by mouth daily.   Yes [provider]  glipiZIDE (GLUCOTROL) 5 MG tablet Take 0.5 tablets (2.5 mg total) by mouth daily before breakfast. 05/30/21  Yes Alcus Dad, MD  hydrOXYzine (VISTARIL) 25 MG capsule Take 25 mg by mouth at bedtime. 02/02/21   Yes [provider]  insulin aspart protamine- aspart (NOVOLOG MIX 70/30) (70-30) 100 UNIT/ML injection Inject 0.15 mLs (15 Units total) into the skin 2 (two) times daily with a meal. Patient taking differently: Inject 15-17 Units into the skin See admin instructions. Inject 15 units subcutaneous in the morning, then inject 17 units  subcutaneous in the evening 02/20/19  Yes Love, Ivan Anchors, PA-C  ipratropium-albuterol (DUONEB) 0.5-2.5 (3) MG/3ML SOLN Take 3 mLs by nebulization every 4 (four) hours as needed. Patient taking differently: Take 3 mLs by nebulization every 4 (four) hours as needed (sob/wheezing). 06/25/20  Yes Little Ishikawa, MD  isosorbide-hydrALAZINE (BIDIL) 20-37.5 MG tablet Take 2 tablets by mouth 3 (three) times daily. 10/14/20  Yes Patwardhan, Manish J, MD  metFORMIN (GLUCOPHAGE) 1000 MG tablet Take 1,000 mg by mouth 2 (two) times daily. 07/10/21  Yes [provider]  rosuvastatin (CRESTOR) 40 MG tablet Take 40 mg by mouth daily.   Yes [provider]  vitamin B-12 (CYANOCOBALAMIN) 1000 MCG tablet Take 1,000 mcg by mouth daily.   Yes [provider]  Accu-Chek FastClix Lancets MISC 4 (four) times daily. 05/28/19   [provider]  ACCU-CHEK GUIDE test strip 1 each by Other route See admin instructions. for testing 08/28/20   Woodroe Mode, MD  blood glucose meter kit and supplies KIT Dispense based on patient and insurance preference. Use up to four times daily as directed. (FOR ICD-9 250.00, 250.01). 04/12/18   Martyn Ehrich, NP  furosemide (LASIX) 40 MG tablet Take 1 tablet (40 mg total) by mouth daily. 07/21/21 07/21/22  Orvis Brill, MD  Insulin Syringes, Disposable, G-881 1 ML MISC 1 application by Does not apply route 2 (two) times a day. 02/20/19   Love, Ivan Anchors, PA-C    Allergies    Bee pollen, Dust mite extract, and Mixed ragweed  Review of Systems   Review of Systems  Constitutional: Negative.   HENT: Negative.     Eyes: Negative.   Respiratory:  Positive for cough and shortness of breath.   Cardiovascular:  Positive for leg swelling.  Gastrointestinal: Negative.   Endocrine: Positive for cold intolerance.  Genitourinary: Negative.   Musculoskeletal: Negative.   Skin: Negative.   Allergic/Immunologic: Negative.   Neurological: Negative.   Hematological: Negative.   Psychiatric/Behavioral: Negative.     Physical Exam Updated Vital Signs BP (!) 177/86    Pulse 76    Temp 97.9 F (36.6 C)    Resp (!) 22    SpO2 97%   Physical Exam Constitutional:      General: She is not in acute distress. HENT:     Head: Normocephalic and atraumatic.  Eyes:     Extraocular Movements: Extraocular movements intact.     Pupils: Pupils are equal, round, and reactive to light.  Cardiovascular:     Rate and Rhythm: Normal rate and regular rhythm.     Heart sounds: No murmur heard.   No friction rub. No gallop.  Pulmonary:     Effort: Pulmonary effort is normal.     Comments: Minimal crackles in left lower lung base Abdominal:     General: Abdomen is flat. There is no distension.     Tenderness: There is no abdominal tenderness.  Musculoskeletal:        General: Normal range of motion.     Right lower leg: Edema present.     Left lower leg: Edema present.  Skin:    General: Skin is warm and dry.  Neurological:     General: No focal deficit present.     Mental Status: She is alert and oriented to person, place, and time. Mental status is at baseline.  Psychiatric:        Mood and Affect: Mood normal.  Behavior: Behavior normal.    ED Results / Procedures / Treatments   Labs (all labs ordered are listed, but only abnormal results are displayed) Labs Reviewed  COMPREHENSIVE METABOLIC PANEL - Abnormal; Notable for the following components:      Result Value   Glucose, Bld 119 (*)    Creatinine, Ser 1.94 (*)    GFR, Estimated 31 (*)    All other components within normal limits  BRAIN  NATRIURETIC PEPTIDE - Abnormal; Notable for the following components:   B Natriuretic Peptide 377.5 (*)    All other components within normal limits  CBC WITH DIFFERENTIAL/PLATELET - Abnormal; Notable for the following components:   RBC 3.63 (*)    Hemoglobin 10.7 (*)    HCT 33.5 (*)    All other components within normal limits  TROPONIN I (HIGH SENSITIVITY) - Abnormal; Notable for the following components:   Troponin I (High Sensitivity) 24 (*)    All other components within normal limits  TROPONIN I (HIGH SENSITIVITY) - Abnormal; Notable for the following components:   Troponin I (High Sensitivity) 26 (*)    All other components within normal limits    EKG EKG Interpretation  Date/Time:  Wednesday July 21 2021 13:26:34 EST Ventricular Rate:  75 PR Interval:  182 QRS Duration: 82 QT Interval:  438 QTC Calculation: 489 R Axis:   89 Text Interpretation: Normal sinus rhythm Septal infarct , age undetermined Abnormal ECG No significant change since last tracing Confirmed by Wandra Arthurs [phone]) on 07/21/2021 6:39:04 PM  Radiology DG Chest 2 View  Result Date: 07/21/2021 CLINICAL DATA:  Lower extremity swelling and shortness of breath for 3 days. History of CHF. Diabetes. Hypertension. EXAM: CHEST - 2 VIEW COMPARISON:  05/27/2021 FINDINGS: Midline trachea. Moderate cardiomegaly. No pleural effusion or pneumothorax. Development of pulmonary interstitial prominence and indistinctness. IMPRESSION: Cardiomegaly with development of mild congestive heart failure. Electronically Signed   By: Abigail Miyamoto M.D.   On: 07/21/2021 14:35    Procedures Procedures   Medications Ordered in ED Medications  furosemide (LASIX) injection 40 mg (40 mg Intravenous Given 07/21/21 1903)    ED Course  I have reviewed the triage vital signs and the nursing notes.  Pertinent labs & imaging results that were available during my care of the patient were reviewed by me and considered in my medical  decision making (see chart for details).    MDM Rules/Calculators/A&P                         This is a 50 year old female with past medical history of CHF (most recent EF 60-65% in November 2021) who presents to the ED today with complaints of worsening shortness of breath, orthopnea, and bilateral leg swelling.  The patient has not taken Lasix for at least 1 week and admits to dietary indiscretion.  On arrival, patient hypertensive but otherwise hemodynamically stable without new oxygen requirement.  BNP 377.5, creatinine 1.94, down from 2.03 one month ago.  EKG shows sinus rhythm.  Troponin WNL. We have given the patient 1 x Lasix in the ED. We have discharged her on a 5-day course of Lasix.  She is already scheduled to follow-up with her cardiologist tomorrow, therefore we have reemphasized the need to attend this appointment to discuss her recent ED visit. Patient was advised to return to the ED with any acutely changing or worsening symptoms.   Final Clinical Impression(s) / ED Diagnoses Final diagnoses:  Shortness of breath  Congestive heart failure, unspecified HF chronicity, unspecified heart failure type [facility])    Rx / DC Orders ED Discharge Orders          Ordered    furosemide (LASIX) 40 MG tablet  Daily,   Status:  Discontinued        07/21/21 2031    furosemide (LASIX) 40 MG tablet  Daily        07/21/21 2032             Orvis Brill, MD 07/21/21 2037    Drenda Freeze, MD 07/25/21 1531

## 2021-07-21 NOTE — ED Notes (Signed)
Fluid challenge tolerated well, assisted to restroom to change ito paper scrubs

## 2021-07-21 NOTE — Discharge Instructions (Addendum)
You were seen in the ED for shortness of breath and leg swelling.  Your work-up shows that you have a lot of fluid on you due to your heart failure.  While you were here, we gave you some Lasix.  I am giving you a 5-day course of Lasix.  Please take this as directed.  Please follow-up with your cardiologist tomorrow to discuss your recent ED visit.  Please also follow-up with your primary care provider to discuss this further.  If any symptoms change or worsen acutely, please return to the nearest emergency department.

## 2021-07-22 ENCOUNTER — Ambulatory Visit: Payer: Medicaid Other | Admitting: Cardiology

## 2021-07-22 ENCOUNTER — Encounter: Payer: Self-pay | Admitting: Cardiology

## 2021-07-22 ENCOUNTER — Other Ambulatory Visit: Payer: Self-pay

## 2021-07-22 ENCOUNTER — Telehealth: Payer: Self-pay | Admitting: Neurology

## 2021-07-22 VITALS — BP 196/98 | HR 76 | Temp 98.3°F | Resp 16 | Ht 69.0 in | Wt 246.0 lb

## 2021-07-22 DIAGNOSIS — I10 Essential (primary) hypertension: Secondary | ICD-10-CM

## 2021-07-22 DIAGNOSIS — R600 Localized edema: Secondary | ICD-10-CM | POA: Insufficient documentation

## 2021-07-22 MED ORDER — FUROSEMIDE 40 MG PO TABS: 40.0000 mg | ORAL_TABLET | Freq: Every day | ORAL | 3 refills | Status: DC

## 2021-07-22 MED ORDER — LABETALOL HCL 300 MG PO TABS: 300.0000 mg | ORAL_TABLET | Freq: Two times a day (BID) | ORAL | 3 refills | Status: DC

## 2021-07-22 NOTE — Progress Notes (Signed)
Patient referred by Teresa Mccreedy, MD for hypertensive heart disease  Subjective:   [name], female    DOB: [birth year], 50 y.o.   MRN: [id number]   Chief Complaint  Patient presents with   Hypertension   Edema   Shortness of Breath   Follow-up    50 y.o. African-American female with hypertension, uncontrolled type 2 diabetes mellitus, hyperlipidemia, tobacco abuse, recurrent strokes.  Patient has recently experienced increased trouble swallowing.  Blood pressure better controlled.  She was previously started on clonidine, but she stopped it due to drowsiness.  She tells me that she takes amlodipine combination of benazepril, but is not sure of the dose.   Current Outpatient Medications on File Prior to Visit  Medication Sig Dispense Refill   Accu-Chek FastClix Lancets MISC 4 (four) times daily.     ACCU-CHEK GUIDE test strip 1 each by Other route See admin instructions. for testing 100 each 2   albuterol (VENTOLIN HFA) 108 (90 Base) MCG/ACT inhaler Inhale into the lungs every 6 (six) hours as needed for wheezing or shortness of breath.     amLODipine (NORVASC) 10 MG tablet Take 10 mg by mouth daily.     aspirin 81 MG EC tablet Take 1 tablet (81 mg total) by mouth daily. 90 tablet 2   blood glucose meter kit and supplies KIT Dispense based on patient and insurance preference. Use up to four times daily as directed. (FOR ICD-9 250.00, 250.01). 1 each 0   Cholecalciferol (VITAMIN D3) 25 MCG (1000 UT) CAPS Take 1 capsule by mouth daily.     fluticasone (FLONASE) 50 MCG/ACT nasal spray Place 2 sprays into both nostrils daily as needed for allergies or rhinitis.     folic acid (FOLVITE) 1 MG tablet Take 1 mg by mouth daily.     glipiZIDE (GLUCOTROL) 5 MG tablet Take 0.5 tablets (2.5 mg total) by mouth daily before breakfast. 15 tablet 0   hydrOXYzine (VISTARIL) 25 MG capsule Take 25 mg by mouth at bedtime.     insulin aspart protamine- aspart (NOVOLOG MIX 70/30) (70-30)  100 UNIT/ML injection Inject 0.15 mLs (15 Units total) into the skin 2 (two) times daily with a meal. (Patient taking differently: Inject 15-17 Units into the skin See admin instructions. Inject 15 units subcutaneous in the morning, then inject 17 units subcutaneous in the evening) 10 mL 11   Insulin Syringes, Disposable, V-784 1 ML MISC 1 application by Does not apply route 2 (two) times a day. 100 each 0   ipratropium-albuterol (DUONEB) 0.5-2.5 (3) MG/3ML SOLN Take 3 mLs by nebulization every 4 (four) hours as needed. (Patient taking differently: Take 3 mLs by nebulization every 4 (four) hours as needed (sob/wheezing).) 360 mL 0   isosorbide-hydrALAZINE (BIDIL) 20-37.5 MG tablet Take 2 tablets by mouth 3 (three) times daily. 540 tablet 3   metFORMIN (GLUCOPHAGE) 1000 MG tablet Take 1,000 mg by mouth 2 (two) times daily.     rosuvastatin (CRESTOR) 40 MG tablet Take 40 mg by mouth daily.     vitamin B-12 (CYANOCOBALAMIN) 1000 MCG tablet Take 1,000 mcg by mouth daily.     No current facility-administered medications on file prior to visit.    Cardiovascular studies:  EKG 11/26/2020: Sinus rhythm 88 bpm Frequent PACs  Left atrial enlargement Old anteroseptal infarct    Echocardiogram 06/25/2020:   1. Left ventricular ejection fraction, by estimation, is 60 to 65%. The  left ventricle has normal function. The left ventricle has  no regional  wall motion abnormalities. There is severe left ventricular hypertrophy.  Left ventricular diastolic parameters   were normal.   2. Right ventricular systolic function is normal. The right ventricular  size is normal. There is normal pulmonary artery systolic pressure. The  estimated right ventricular systolic pressure is 13.2 mmHg.   3. A small pericardial effusion is present.   4. The mitral valve is normal in structure. No evidence of mitral valve  regurgitation.   5. The aortic valve was not well visualized. Aortic valve regurgitation  is mild. No  aortic stenosis is present.   6. The inferior vena cava is dilated in size with >50% respiratory  variability, suggesting right atrial pressure of 8 mmHg.   Event monitor 09/17/2019 - 10/16/2019: Diagnostic time: 94%  Dominant rhythm: Sinus. HR 66-112 bpm. Avg HR 80 bpm. 6 episodes of NSVT up to 9 beats seen.  No atrial fibrillation/atrial flutter/SVT/high grade AV block, sinus pause >3sec noted. Symptoms reported: None  MRI brain 08/20/2019: - Right frontal parasagittal and anterior corpus callosum lesion which is hyperintense on DWI, slightly hypointense on ADC map, hyperintense on T2 and FLAIR views, with heterogeneous enhancement.  Considerations could include subacute infarction, autoimmune, inflammatory or neoplastic etiologies. This is a new finding compared to 02/11/2019. - Mild scattered periventricular, subcortical, pontine chronic small vessel ischemic disease.  CTA head/neck 02/12/2019: 1. Diffuse distal branch vessel irregularity throughout the circle-of-Willis without a significant proximal stenosis or occlusion. 2. Atherosclerotic changes are evident within the cavernous internal carotid arteries and right greater than left M1 segments with diffuse vessel irregularity and mild narrowing of less than 50% relative to the more distal vessels. 3. Tortuosity of the cervical internal carotid arteries and vertebral arteries bilaterally without significant stenosis. This is nonspecific, but often seen in the setting of chronic hypertension. 4. Brainstem infarct and white matter parenchymal changes noted on MRI are not discernible by CT.  MRI brain 02/11/2019: 1. Patchy small volume acute ischemic nonhemorrhagic infarct involving the left lateral midbrain/pons. 2. Underlying mild chronic microvascular ischemic disease.   Recent labs: 07/21/2021: Glucose 119, BUN/Cr 19/1.94. EGFR 31. Na/K 139/3.7. Rest of the CMP normal H/H 10.7/33.5. MCV 92. Platelets 270 HbA1C 8.6% Chol 88, TG  153, HDL 33, LDL 29 BNP 377 Trop 24-->26  06/25/2020: Glucose 136, BUN/Cr 18/1.49. EGFR 43. Na/K 139/3.5.  H/H 7.7/26.6. MCV 72.7. Platelets 249 Results for Teresa, [name] (MRN [id number]) as of 07/15/2020 16:09  Ref. Range 06/24/2020 21:30 06/24/2020 23:14  B Natriuretic Peptide Latest Ref Range: 0.0 - 100.0 pg/mL 427.9 (H)   Troponin I (High Sensitivity) Latest Ref Range: <18 ng/L 17 18 (H)     11/28/2019: Glucose 177, BUN/Cr 33/2.33. EGFR 28. Na/K 138/4.0. Rest of the CMP normal H/H 11/32. MCV 86. Platelets 254 Chol 167, TG 287, HDL 39, LDL 81  03/12/2019: Glucose 170.  BUN/creatinine 26/1.3.  EGFR 57.  NA/K1 39/4.2. H/H 12.6/37.5.  MCV 91.  Platelets 245.  Normal iron studies. Hemoglobin A1c 8.5%. Cholesterol 106, triglycerides 103, HDL 38, LDL 47   Review of Systems  Cardiovascular:  Positive for dyspnea on exertion. Negative for chest pain, leg swelling, palpitations and syncope.  Genitourinary:  Negative for dysuria.  Neurological:  Positive for numbness.       Foot drop  All other systems reviewed and are negative.       Vitals:   07/22/21 1037 07/22/21 1040  BP: (!) 198/98 (!) 196/98  Pulse: 75 76  Resp: 16  Temp: 98.3 F (36.8 C)   SpO2: 98% 98%     Objective:   Physical Exam Vitals and nursing note reviewed.  Constitutional:      General: She is not in acute distress. HENT:     Head: Normocephalic and atraumatic.  Neck:     Vascular: No JVD.  Cardiovascular:     Rate and Rhythm: Normal rate and regular rhythm.     Pulses: Intact distal pulses.     Heart sounds: Murmur heard.  Early systolic murmur is present with a grade of 2/6 at the lower right sternal border.  Pulmonary:     Effort: Pulmonary effort is normal.     Breath sounds: Normal breath sounds. No rales.  Neurological:     Cranial Nerves: No cranial nerve deficit.          Assessment & Recommendations:   50 y.o. 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  Resistant hypertension: Uncontrolled primary hypertension. Currently on amlodipine 10, carvedilol 25 twice daily, BiDil 20-30 7.52 tabs 3 times daily. Renal function has precluded use of spironolactone/ACE or ARB. Change carvedilol 25 twice daily to labetalol 300 twice daily. Given her leg edema, will obtain echocardiogram and added Lasix 40 mg twice daily. While using Lasix, encouraged her to drink 2-3 quarts of water daily.  History of stroke: No Afib on event monitor.  Continue Aspirin, statin.  Type 2 diabetes mellitus: Uncontrolled.  Management as per PCP.  F/u in 3 weeks with me or Lawerance Cruel, PA   Meagon Duskin Esther Hardy, MD [facility] Pager: [phone] Office: [phone] If no answer Cell [phone]

## 2021-07-22 NOTE — Telephone Encounter (Signed)
mcd healthy blue no auth req, sent a message to Butch Penny she will reach out to the patient to schedule.

## 2021-07-27 ENCOUNTER — Ambulatory Visit: Payer: Medicaid Other

## 2021-07-27 ENCOUNTER — Other Ambulatory Visit: Payer: Self-pay

## 2021-07-27 DIAGNOSIS — I10 Essential (primary) hypertension: Secondary | ICD-10-CM

## 2021-07-28 ENCOUNTER — Ambulatory Visit: Payer: Medicaid Other

## 2021-07-28 ENCOUNTER — Ambulatory Visit (HOSPITAL_COMMUNITY)
Admission: RE | Admit: 2021-07-28 | Discharge: 2021-07-28 | Disposition: A | Discharge status: Home / Self Care | Payer: Medicaid Other | Source: Ambulatory Visit | Attending: Neurology | Admitting: Neurology

## 2021-07-28 VITALS — BP 154/82

## 2021-07-28 DIAGNOSIS — R2689 Other abnormalities of gait and mobility: Secondary | ICD-10-CM | POA: Diagnosis present

## 2021-07-28 DIAGNOSIS — Z8673 Personal history of transient ischemic attack (TIA), and cerebral infarction without residual deficits: Secondary | ICD-10-CM | POA: Diagnosis present

## 2021-07-28 DIAGNOSIS — M25572 Pain in left ankle and joints of left foot: Secondary | ICD-10-CM

## 2021-07-28 DIAGNOSIS — M6281 Muscle weakness (generalized): Secondary | ICD-10-CM

## 2021-07-28 LAB — BASIC METABOLIC PANEL
BUN/Creatinine Ratio: 10 (ref 9–23)
BUN: 21 mg/dL (ref 6–24)
CO2: 24 mmol/L (ref 20–29)
Calcium: 8.8 mg/dL (ref 8.7–10.2)
Chloride: 107 mmol/L — ABNORMAL HIGH (ref 96–106)
Creatinine, Ser: 2.13 mg/dL — ABNORMAL HIGH (ref 0.57–1.00)
Glucose: 136 mg/dL — ABNORMAL HIGH (ref 70–99)
Potassium: 4.5 mmol/L (ref 3.5–5.2)
Sodium: 140 mmol/L (ref 134–144)
eGFR: 28 mL/min/{1.73_m2} — ABNORMAL LOW (ref >59)

## 2021-07-28 LAB — BRAIN NATRIURETIC PEPTIDE: BNP: 159.2 pg/mL — ABNORMAL HIGH (ref 0.0–100.0)

## 2021-07-28 NOTE — Progress Notes (Signed)
Carotid artery duplex has been completed. Preliminary results can be found in CV Proc through chart review.   07/28/21 9:58 AM [name] RVT

## 2021-07-28 NOTE — Therapy (Signed)
[place], [place], [address] Phone: [phone]   Fax:  [phone]  Physical Therapy Treatment  Patient Details  Name: [name] MRN: [id number] Date of Birth: [birth year] Referring Provider (PT): Felipa Furnace, [place]   Encounter Date: 07/28/2021   PT End of Session - 07/28/21 1130     Visit Number 10    Number of Visits 24    Date for PT Re-Evaluation 08/07/21    Authorization Type Healthy Blue MCD 05/03/21-07/23/21- 24 visits    PT Start Time 73    PT Stop Time 1216    PT Time Calculation (min) 46 min    Activity Tolerance Patient tolerated treatment well             Past Medical History:  Diagnosis Date   Arthritis    Asthma    Cataract    Mixed form OU   CKD (chronic kidney disease)    Coronary artery disease    Diabetes mellitus    Diabetic retinopathy ([place])    NPDR OU   Hyperlipidemia    Hypertension    Hypertensive retinopathy    OU   Left thyroid nodule    diagnosed 07/2018   PAC (premature atrial contraction) 02/15/2021   Pseudotumor cerebri    Stroke ([place])    Vitamin D deficiency     Past Surgical History:  Procedure Laterality Date   ACHILLES TENDON REPAIR     ACHILLES TENDON SURGERY Left 02/19/2021   Procedure: ACHILLES TENDON REPAIR WITH GRAFT;  Surgeon: Felipa Furnace, DPM;  Location: [facility];  Service: Podiatry;  Laterality: Left;  BLOCK   AMPUTATION TOE Right 05/28/2021   Procedure: AMPUTATION TOE;  Surgeon: Lorenda Peck, MD;  Location: [facility];  Service: Podiatry;  Laterality: Right;  Surgical team will do block   GASTROC RECESSION EXTREMITY Left 02/19/2021   Procedure: GASTROC RECESSION EXTREMITY;  Surgeon: Felipa Furnace, DPM;  Location: [facility];  Service: Podiatry;  Laterality: Left;  Block   TUBAL LIGATION      Vitals:   07/28/21 1134  BP: (!) 154/82     Subjective Assessment - 07/28/21 1130     Subjective Pt  presents to PT with no current pain in either LE. Went to ER and cardiologist, where they prescribed lasix for decreasing fluid. She notes that overall she is feeling much better compared to last session. Pt is ready to begin PT treatment at this time.    Currently in Pain? No/denies    Pain Score 0-No pain           [facility] Adult PT Treatment/Exercise:   Therapeutic Exercise:  NuStep lvl 6 UE/LE x 5 min  STS 2x10 - no UE support Seated heel raise 3x15 15lb L Seated calf stretch w/ strap 3x30" L Seated L PF BTB 2x15 Standing gastroc stretch 2x30" L Standing soleus stretch 2x30" L Step ups x 10 6in fwd ea Total gym knee ext 2x10 15lb Total gym hamstring curl x10 35lb Total gym hamsring curl 2x10 20lbs L only  Neuro Re-Ed: Tandem stance 3x30" ea                              PT Short Term Goals - 05/26/21 2458       PT SHORT TERM GOAL #1   Title Pt will be compliant and knowledgeable with initial HEP  for improved carryover    Baseline initial HEP given; 05/26/21: compliant and independent    Time 3    Period Weeks    Status Achieved    Target Date 05/18/21               PT Long Term Goals - 07/07/21 1146       PT LONG TERM GOAL #1   Title Pt will improve LEFS score to no less than 45/80 as proxy for functional improvement    Baseline 34/80 pn 11/30    Time 12    Period Weeks    Status Revised    Target Date 08/07/21      PT LONG TERM GOAL #2   Title Pt wil decrease 5xSTS to no greater than 18 seconds for improved functional mobility and LE strength    Baseline 25 sec on 11/30 w/ UE support    Time 12    Period Weeks    Status Revised    Target Date 08/07/21      PT LONG TERM GOAL #3   Title Pt will hold L SLS to 20 sec for improved balance and safety    Baseline L SLS 4"    Time 12    Period Weeks    Status On-going    Target Date 08/07/21      PT LONG TERM GOAL #4   Title Pt will improve TUG to no greater than 12 seconds  for improved balance and decreased fall risk    Baseline 17" on 11/30 w/ LoB    Time 12    Period Weeks    Status Revised    Target Date 08/07/21                   Plan - 07/28/21 1139     Clinical Impression Statement Pt was able to complete all prescribed exercises with improved tolerance. Therapy was able to progress LE strengthening and L calf stretching today with pt demonstrated improved balance and stability as well in tandem stance. She continue to benefit from skilled PT services with continued monitoring of cardiovascular s/s. Will continue to progress as tolerated per POC.    PT Treatment/Interventions ADLs/Self Care Home Management;Electrical Stimulation;Cryotherapy;Gait training;Stair training;Functional mobility training;Therapeutic activities;Therapeutic exercise;Balance training;Neuromuscular re-education;Patient/family education;Manual techniques;Vasopneumatic Device;Taping    PT Next Visit Plan assess HEP response, work on balance due to numerous falls; progress as able    PT Home Exercise Plan Access Code: [email or web address]             Patient will benefit from skilled therapeutic intervention in order to improve the following deficits and impairments:  Abnormal gait, Decreased activity tolerance, Decreased balance, Decreased endurance, Decreased mobility, Decreased range of motion, Decreased strength, Difficulty walking, Pain  Visit Diagnosis: Muscle weakness (generalized)  Pain in left ankle and joints of left foot  Other abnormalities of gait and mobility     Problem List Patient Active Problem List   Diagnosis Date Noted   Leg edema 07/22/2021   Chronic osteomyelitis of right foot with draining sinus (HCC)    Influenza A    Diabetic foot ulcer ([place]) 05/27/2021   PAC (premature atrial contraction) 02/15/2021   Symptomatic anemia 06/25/2020   Chronic heart failure with preserved ejection fraction ([place]) 06/25/2020   Educated about COVID-19 virus  infection 04/29/2020   Class 2 severe obesity with serious comorbidity and body mass index (BMI) of 36.0 to 36.9 in adult [facility]) 03/30/2020   NSVT (  nonsustained ventricular tachycardia) 11/13/2019   Pericardial effusion 09/17/2019   H/O: stroke 04/09/2019   Left ventricular hypertrophy 04/09/2019   Mixed hyperlipidemia 04/08/2019   Uncontrolled type 2 diabetes mellitus with hyperglycemia ([place]) 04/08/2019   Non compliance w medication regimen 02/20/2019   Diabetes mellitus type 2 in obese ([place])    Resistant hypertension    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[place]) 02/13/2019   Cerebrovascular accident (CVA) ([place])    Hypokalemia    Hypomagnesemia    AKI (acute kidney injury) ([place])    Acute CVA (cerebrovascular accident) ([place]) 02/11/2019   Hypertensive urgency 02/11/2019   ARF (acute renal failure) ([place]) 02/11/2019   Type 2 diabetes mellitus with vascular disease ([place]) 02/11/2019   Type 2 diabetes with complication ([place]) 46/80/3212   Essential hypertension 01/08/2018   Obstructive sleep apnea 08/26/2017    Ward Chatters, PT 07/28/2021, 12:26 PM  [facility] [address] [place], [place], [address] Phone: [phone]   Fax:  [phone]  Name: EVIAN DERRINGER MRN: [id number] Date of Birth: 11-21-1970

## 2021-07-29 ENCOUNTER — Other Ambulatory Visit: Payer: Medicaid Other

## 2021-08-04 ENCOUNTER — Other Ambulatory Visit: Payer: Self-pay

## 2021-08-04 ENCOUNTER — Ambulatory Visit: Payer: Medicaid Other

## 2021-08-04 DIAGNOSIS — M25572 Pain in left ankle and joints of left foot: Secondary | ICD-10-CM

## 2021-08-04 DIAGNOSIS — R2689 Other abnormalities of gait and mobility: Secondary | ICD-10-CM

## 2021-08-04 DIAGNOSIS — M6281 Muscle weakness (generalized): Secondary | ICD-10-CM

## 2021-08-04 NOTE — Therapy (Signed)
[name], [place], [address] Phone: [phone]   Fax:  [phone]  Physical Therapy Treatment/Discharge  Patient Details  Name: [name] MRN: [id number] Date of Birth: 1970/09/01 Referring Provider (PT): Felipa Furnace, [place]   Encounter Date: 08/04/2021   PT End of Session - 08/04/21 1035     Visit Number 11    Number of Visits 24    Date for PT Re-Evaluation 08/07/21    Authorization Type Healthy Blue MCD 05/03/21-07/23/21- 24 visits    PT Start Time 1042    PT Stop Time 1120    PT Time Calculation (min) 38 min    Activity Tolerance Patient tolerated treatment well             Past Medical History:  Diagnosis Date   Arthritis    Asthma    Cataract    Mixed form OU   CKD (chronic kidney disease)    Coronary artery disease    Diabetes mellitus    Diabetic retinopathy ([place])    NPDR OU   Hyperlipidemia    Hypertension    Hypertensive retinopathy    OU   Left thyroid nodule    diagnosed 07/2018   PAC (premature atrial contraction) 02/15/2021   Pseudotumor cerebri    Stroke ([place])    Vitamin D deficiency     Past Surgical History:  Procedure Laterality Date   ACHILLES TENDON REPAIR     ACHILLES TENDON SURGERY Left 02/19/2021   Procedure: ACHILLES TENDON REPAIR WITH GRAFT;  Surgeon: Felipa Furnace, DPM;  Location: [facility];  Service: Podiatry;  Laterality: Left;  BLOCK   AMPUTATION TOE Right 05/28/2021   Procedure: AMPUTATION TOE;  Surgeon: Lorenda Peck, MD;  Location: [facility];  Service: Podiatry;  Laterality: Right;  Surgical team will do block   GASTROC RECESSION EXTREMITY Left 02/19/2021   Procedure: GASTROC RECESSION EXTREMITY;  Surgeon: Felipa Furnace, DPM;  Location: [facility];  Service: Podiatry;  Laterality: Left;  Block   TUBAL LIGATION      There were no vitals filed for this visit.   Subjective Assessment - 08/04/21 1035      Subjective Pt presents to PT with reports of bilateral LE muscle spasms in each calf. She continues to note improved cardiovascular symptoms with no current chest pain or lightheadness at this time. Pt has continued to be compliant with HEP and notes no adverse effect. She is ready to begin PT treatment at this time.    Currently in Pain? No/denies    Pain Score 0-No pain           [facility] Adult PT Treatment/Exercise:   Therapeutic Exercise:  NuStep lvl 6 LE x 5 min while taking subjective STS x10 - no UE support Seated heel raise x 20 Seated L ankle PF black TB x 20 Standing hip abd/ext 2x10 RTB ea Standing mini squat x 10 Tandem stance x 30" L back SLS x 30" L Standing calf stretch L x 30"   Therapeutic Activity: Assessment of tests/measures, goals, and outcomes for purposes of discharge     [facility] PT Assessment - 08/04/21 0001       Observation/Other Assessments   Lower Extremity Functional Scale  45/80      Functional Tests   Functional tests Sit to Stand;Single leg stance      Single Leg Stance   Comments 10" L  Transfers   Five time sit to stand comments  15      Timed Up and Go Test   Normal TUG (seconds) 11                                    PT Education - 08/04/21 1121     Education Details HEP and discharge planning    Person(s) Educated Patient    Methods Explanation;Demonstration;Handout    Comprehension Verbalized understanding;Returned demonstration              PT Short Term Goals - 05/26/21 0927       PT SHORT TERM GOAL #1   Title Pt will be compliant and knowledgeable with initial HEP for improved carryover    Baseline initial HEP given; 05/26/21: compliant and independent    Time 3    Period Weeks    Status Achieved    Target Date 05/18/21               PT Long Term Goals - 08/04/21 1048       PT LONG TERM GOAL #1   Title Pt will improve LEFS score to no less than 45/80 as proxy for functional  improvement    Baseline 34/80 pn 11/30; 45/80 on 12/28    Time 12    Period Weeks    Status Achieved    Target Date 08/07/21      PT LONG TERM GOAL #2   Title Pt wil decrease 5xSTS to no greater than 18 seconds for improved functional mobility and LE strength    Baseline 25 sec on 11/30 w/ UE support; 15 seconds on 08/04/21    Time 12    Period Weeks    Status Achieved    Target Date 08/07/21      PT LONG TERM GOAL #3   Title Pt will hold L SLS to 20 sec for improved balance and safety    Baseline 08/04/21: L SLS 10 sec    Time 12    Period Weeks    Status Partially Met    Target Date 08/07/21      PT LONG TERM GOAL #4   Title Pt will improve TUG to no greater than 12 seconds for improved balance and decreased fall risk    Baseline 17" on 11/30 w/ LoB; 11 seconds on 12/28 w/ no LoB    Time 12    Period Weeks    Status Achieved    Target Date 08/07/21                   Plan - 08/04/21 1101     Clinical Impression Statement Pt was able to complete all prescribed exercises exercises and demonstrated knowledge of HEP with on adverse effect. Over the course of PT treatment, she has made great progress towards meeting all LTGs, especially over the last few weeks compared to revaluation on 07/07/21. She has now met all LTGs with exception of SLS time on L, and should continue to improve with HEP compliance. She is being discharged at this time with HEP in place and is in agreement with current plan.    PT Treatment/Interventions ADLs/Self Care Home Management;Electrical Stimulation;Cryotherapy;Gait training;Stair training;Functional mobility training;Therapeutic activities;Therapeutic exercise;Balance training;Neuromuscular re-education;Patient/family education;Manual techniques;Vasopneumatic Device;Taping    PT Home Exercise Plan Access Code: [email or web address]  Patient will benefit from skilled therapeutic intervention in order to improve the following deficits and  impairments:  Abnormal gait, Decreased activity tolerance, Decreased balance, Decreased endurance, Decreased mobility, Decreased range of motion, Decreased strength, Difficulty walking, Pain  Visit Diagnosis: Muscle weakness (generalized)  Pain in left ankle and joints of left foot  Other abnormalities of gait and mobility     Problem List Patient Active Problem List   Diagnosis Date Noted   Leg edema 07/22/2021   Chronic osteomyelitis of right foot with draining sinus (HCC)    Influenza A    Diabetic foot ulcer ([place]) 05/27/2021   PAC (premature atrial contraction) 02/15/2021   Symptomatic anemia 06/25/2020   Chronic heart failure with preserved ejection fraction ([place]) 06/25/2020   Educated about COVID-19 virus infection 04/29/2020   Class 2 severe obesity with serious comorbidity and body mass index (BMI) of 36.0 to 36.9 in adult ([place]) 03/30/2020   NSVT (nonsustained ventricular tachycardia) 11/13/2019   Pericardial effusion 09/17/2019   H/O: stroke 04/09/2019   Left ventricular hypertrophy 04/09/2019   Mixed hyperlipidemia 04/08/2019   Uncontrolled type 2 diabetes mellitus with hyperglycemia ([place] ) 04/08/2019   Non compliance w medication regimen 02/20/2019   Diabetes mellitus type 2 in obese ([place])    Resistant hypertension    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[place]) 02/13/2019   Cerebrovascular accident (CVA) ([place])    Hypokalemia    Hypomagnesemia    AKI (acute kidney injury) ([place])    Acute CVA (cerebrovascular accident) ([place]) 02/11/2019   Hypertensive urgency 02/11/2019   ARF (acute renal failure) ([place]) 02/11/2019   Type 2 diabetes mellitus with vascular disease ([place]) 02/11/2019   Type 2 diabetes with complication ([place]) 82/64/1583   Essential hypertension 01/08/2018   Obstructive sleep apnea 08/26/2017    Ward Chatters, PT 08/04/2021, 12:28 PM  [place] [facility] [address] [place], [place], [address] Phone: [phone]   Fax:   [phone]  Name: NADIAH CORBIT MRN: [id number] Date of Birth: 24-Apr-1971   PHYSICAL THERAPY DISCHARGE SUMMARY  Visits from Start of Care: 11  Current functional level related to goals / functional outcomes: See goals and outcomes   Remaining deficits: See goals and outcomes   Education / Equipment: HEP   Patient agrees to discharge. Patient goals were met. Patient is being discharged due to being pleased with the current functional level.

## 2021-08-10 NOTE — Progress Notes (Signed)
Patient referred by Teresa Mccreedy, MD for hypertensive heart disease  Subjective:   [name], female    DOB: [birth year], 51 y.o.   MRN: [id number]   Chief Complaint  Patient presents with   Hypertension   Follow-up   Results    51 y.o. African-American female with hypertension, uncontrolled type 2 diabetes mellitus, hyperlipidemia, tobacco abuse, recurrent strokes.  Patient presents for 3-week follow-up of hypertension.  Last office visit changed carvedilol from 25 mg twice daily to labetalol 300 mg twice daily.  Also added Lasix 40 mg twice daily given leg edema and ordered echocardiogram.  Echocardiogram is unchanged compared to previous in 10/2019, with LVEF of 55%, moderate AR, mild MR, and severe LVH with mild LVOT obstruction.  Upon further questioning patient has been without several medications for the last 5 days and also never switched from carvedilol to labetalol.  She has been without BiDil, aspirin, and glipizide for about 5 days as she has been unable to afford her co-pays to obtain the medication.  She also states she was seen by nephrology since last office visit and advised to reduce BiDil to 2 tablets p.o. twice daily and increase Lasix to 40 mg p.o. twice daily.  She denies chest pain, palpitations, dizziness, syncope, near syncope.  Denies symptoms suggestive of CVA/TIA.  Patient reports she does continue to have intermittent bilateral leg edema, however Lasix has improved this.  Current Outpatient Medications on File Prior to Visit  Medication Sig Dispense Refill   Accu-Chek FastClix Lancets MISC 4 (four) times daily.     ACCU-CHEK GUIDE test strip 1 each by Other route See admin instructions. for testing 100 each 2   albuterol (VENTOLIN HFA) 108 (90 Base) MCG/ACT inhaler Inhale into the lungs every 6 (six) hours as needed for wheezing or shortness of breath.     amLODipine-benazepril (LOTREL) 10-40 MG capsule Take 1 capsule by mouth daily.     aspirin  81 MG EC tablet Take 1 tablet (81 mg total) by mouth daily. 90 tablet 2   blood glucose meter kit and supplies KIT Dispense based on patient and insurance preference. Use up to four times daily as directed. (FOR ICD-9 250.00, 250.01). 1 each 0   carvedilol (COREG) 25 MG tablet Take 25 mg by mouth 2 (two) times daily with a meal.     fluticasone (FLONASE) 50 MCG/ACT nasal spray Place 2 sprays into both nostrils daily as needed for allergies or rhinitis.     furosemide (LASIX) 40 MG tablet Take 1 tablet (40 mg total) by mouth daily. 120 tablet 3   glipiZIDE (GLUCOTROL) 5 MG tablet Take 0.5 tablets (2.5 mg total) by mouth daily before breakfast. 15 tablet 0   insulin aspart protamine- aspart (NOVOLOG MIX 70/30) (70-30) 100 UNIT/ML injection Inject 0.15 mLs (15 Units total) into the skin 2 (two) times daily with a meal. (Patient taking differently: Inject 15-17 Units into the skin See admin instructions. Inject 15 units subcutaneous in the morning, then inject 17 units subcutaneous in the evening) 10 mL 11   Insulin Syringes, Disposable, W-263 1 ML MISC 1 application by Does not apply route 2 (two) times a day. 100 each 0   ipratropium-albuterol (DUONEB) 0.5-2.5 (3) MG/3ML SOLN Take 3 mLs by nebulization every 4 (four) hours as needed. (Patient taking differently: Take 3 mLs by nebulization every 4 (four) hours as needed (sob/wheezing).) 360 mL 0   isosorbide-hydrALAZINE (BIDIL) 20-37.5 MG tablet Take 2 tablets by mouth  3 (three) times daily. 540 tablet 3   metFORMIN (GLUCOPHAGE) 1000 MG tablet Take 1,000 mg by mouth 2 (two) times daily.     rosuvastatin (CRESTOR) 40 MG tablet Take 40 mg by mouth daily.     Cholecalciferol (VITAMIN D3) 25 MCG (1000 UT) CAPS Take 1 capsule by mouth daily. (Patient not taking: Reported on 08/11/2021)     No current facility-administered medications on file prior to visit.    Cardiovascular studies: Carotid artery duplex 07/28/2021: Right Carotid: Velocities in the right  ICA are consistent with a 1-39% stenosis.  Left Carotid: Velocities in the left ICA are consistent with a 1-39% stenosis.  Vertebrals: Bilateral vertebral arteries demonstrate antegrade flow.  PCV ECHOCARDIOGRAM COMPLETE 07/27/2021 Left ventricle cavity is normal in size. Severe concentric hypertrophy of the left ventricle with mild LVOT obstruction. Max gradient 10 mmHg.  Normal global wall motion. Normal LV systolic function with EF 55%. Indeterminate diastolic filling pattern. Structurally normal trileaflet aortic valve. No evidence of aortic stenosis. Moderate (Grade II) aortic regurgitation. Mild (Grade I) mitral regurgitation. Mild tricuspid regurgitation. Estimated pulmonary artery systolic pressure 27 mmHg. Mild pulmonic regurgitation. No significant change compared to previous study on 10/16/2019.  EKG 11/26/2020: Sinus rhythm 88 bpm Frequent PACs  Left atrial enlargement Old anteroseptal infarct    Echocardiogram 06/25/2020:   1. Left ventricular ejection fraction, by estimation, is 60 to 65%. The  left ventricle has normal function. The left ventricle has no regional  wall motion abnormalities. There is severe left ventricular hypertrophy.  Left ventricular diastolic parameters   were normal.   2. Right ventricular systolic function is normal. The right ventricular  size is normal. There is normal pulmonary artery systolic pressure. The  estimated right ventricular systolic pressure is 70.9 mmHg.   3. A small pericardial effusion is present.   4. The mitral valve is normal in structure. No evidence of mitral valve  regurgitation.   5. The aortic valve was not well visualized. Aortic valve regurgitation  is mild. No aortic stenosis is present.   6. The inferior vena cava is dilated in size with >50% respiratory  variability, suggesting right atrial pressure of 8 mmHg.   Event monitor 09/17/2019 - 10/16/2019: Diagnostic time: 94%  Dominant rhythm: Sinus. HR 66-112 bpm. Avg  HR 80 bpm. 6 episodes of NSVT up to 9 beats seen.  No atrial fibrillation/atrial flutter/SVT/high grade AV block, sinus pause >3sec noted. Symptoms reported: None  MRI brain 08/20/2019: - Right frontal parasagittal and anterior corpus callosum lesion which is hyperintense on DWI, slightly hypointense on ADC map, hyperintense on T2 and FLAIR views, with heterogeneous enhancement.  Considerations could include subacute infarction, autoimmune, inflammatory or neoplastic etiologies. This is a new finding compared to 02/11/2019. - Mild scattered periventricular, subcortical, pontine chronic small vessel ischemic disease.  CTA head/neck 02/12/2019: 1. Diffuse distal branch vessel irregularity throughout the circle-of-[place]is without a significant proximal stenosis or occlusion. 2. Atherosclerotic changes are evident within the cavernous internal carotid arteries and right greater than left M1 segments with diffuse vessel irregularity and mild narrowing of less than 50% relative to the more distal vessels. 3. Tortuosity of the cervical internal carotid arteries and vertebral arteries bilaterally without significant stenosis. This is nonspecific, but often seen in the setting of chronic hypertension. 4. Brainstem infarct and white matter parenchymal changes noted on MRI are not discernible by CT.  MRI brain 02/11/2019: 1. Patchy small volume acute ischemic nonhemorrhagic infarct involving the left lateral midbrain/pons. 2. Underlying mild  chronic microvascular ischemic disease.   Recent labs: 07/27/2021: BNP 159 Creatinine 2.13, GFR 28, sodium 140, potassium 4.5  07/21/2021: Glucose 119, BUN/Cr 19/1.94. EGFR 31. Na/K 139/3.7. Rest of the CMP normal H/H 10.7/33.5. MCV 92. Platelets 270 HbA1C 8.6% Chol 88, TG 153, HDL 33, LDL 29 BNP 377 Trop 24-->26  06/25/2020: Glucose 136, BUN/Cr 18/1.49. EGFR 43. Na/K 139/3.5.  H/H 7.7/26.6. MCV 72.7. Platelets 249 Results for JOCABED, CHEESE (MRN  [id number]) as of 07/15/2020 16:09  Ref. Range 06/24/2020 21:30 06/24/2020 23:14  B Natriuretic Peptide Latest Ref Range: 0.0 - 100.0 pg/mL 427.9 (H)   Troponin I (High Sensitivity) Latest Ref Range: <18 ng/L 17 18 (H)     11/28/2019: Glucose 177, BUN/Cr 33/2.33. EGFR 28. Na/K 138/4.0. Rest of the CMP normal H/H 11/32. MCV 86. Platelets 254 Chol 167, TG 287, HDL 39, LDL 81  03/12/2019: Glucose 170.  BUN/creatinine 26/1.3.  EGFR 57.  NA/K1 39/4.2. H/H 12.6/37.5.  MCV 91.  Platelets 245.  Normal iron studies. Hemoglobin A1c 8.5%. Cholesterol 106, triglycerides 103, HDL 38, LDL 47   Review of Systems  Cardiovascular:  Positive for dyspnea on exertion and leg swelling (intermittent). Negative for chest pain, palpitations and syncope.  Genitourinary:  Negative for dysuria.  Neurological:  Positive for numbness. Negative for dizziness, focal weakness and headaches.       Foot drop  All other systems reviewed and are negative.       Vitals:   08/11/21 1015 08/11/21 1052  BP: (!) 215/111 (!) 190/100  Pulse: 75   Temp: 97.8 F (36.6 C)   SpO2: 99%      Objective:   Physical Exam Vitals and nursing note reviewed.  Constitutional:      General: She is not in acute distress. Neck:     Vascular: No JVD.  Cardiovascular:     Rate and Rhythm: Normal rate and regular rhythm.     Pulses: Intact distal pulses.     Heart sounds: Murmur heard.  Early systolic murmur is present with a grade of 2/6 at the lower right sternal border.  Pulmonary:     Effort: Pulmonary effort is normal.     Breath sounds: Normal breath sounds. No rales.  Musculoskeletal:     Right lower leg: Edema (trace) present.     Left lower leg: Edema (trace) present.  Neurological:     General: No focal deficit present.     Mental Status: She is oriented to person, place, and time.     Cranial Nerves: No cranial nerve deficit.          Assessment & Recommendations:   51 y.o. 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  Resistant hypertension: Uncontrolled primary hypertension. Unfortunately due to cost constraints patient has been noncompliant with medications.  Has not taken BiDil for at least the last 5 days and cannot switch from carvedilol to labetalol.  Discussed at length and also worked with our office clinical pharmacist to find a way to get patient her necessary medications, however she does not appear to qualify for free clinic assistance.  Discussed at length with patient the importance of medication compliance and the risk of uncontrolled blood pressure, particularly stroke. Discussed option of admission to the hospital to control blood pressure however patient was resistant to this.  Long-term patient needs assistance with affording medications so that she does not continue to  have compliance issues due to cost.  Patient agrees to obtain Bidil and labetolol as soon as she is able and take as directed. Unfortunately no samples were available for [place] to provide at this time.  Counseled patient regarding signs symptoms that would warrant urgent or emergent evaluation, she verbalized understanding agreement.  In regard to patient's bilateral leg edema, it has improved with use of Lasix and her echocardiogram was stable compared to previous.  Advised patient regarding low-sodium diet.  History of stroke: No Afib on event monitor.  Continue Aspirin, statin.  Type 2 diabetes mellitus: Uncontrolled.  Management as per PCP.  Spent a significant portion of today's office visit counseling patient regarding the importance of diet and medication compliance as well as the risk associated with uncontrolled hypertension, particularly as high as her is at today's visit.  Patient is aware of the risks and agrees to obtain medications as soon as able.   F/u in 4  weeks sooner if needed.    Alethia Berthold, PA-C 08/11/2021, 1:16 PM Office: [phone]

## 2021-08-11 ENCOUNTER — Other Ambulatory Visit: Payer: Self-pay

## 2021-08-11 ENCOUNTER — Ambulatory Visit: Payer: Medicaid Other | Admitting: Student

## 2021-08-11 ENCOUNTER — Encounter: Payer: Self-pay | Admitting: Student

## 2021-08-11 VITALS — BP 190/100 | HR 75 | Temp 97.8°F | Ht 69.0 in | Wt 245.0 lb

## 2021-08-11 DIAGNOSIS — R600 Localized edema: Secondary | ICD-10-CM

## 2021-08-11 DIAGNOSIS — I10 Essential (primary) hypertension: Secondary | ICD-10-CM

## 2021-08-13 ENCOUNTER — Telehealth: Payer: Self-pay | Admitting: Pharmacist

## 2021-08-13 NOTE — Telephone Encounter (Signed)
Thank you for keeping me in the loop.

## 2021-08-13 NOTE — Telephone Encounter (Signed)
Pt was referred to [facility] [place] for care coordination services. Reviewed pt's current and updated med list with staff. Staff working with pt to coordinate medication management services. Pt being referred to services with local pharmacies that might be able to provide pt with credit in the mean time. Pt working with social services to see if she would qualify for disability services as well.

## 2021-09-08 ENCOUNTER — Other Ambulatory Visit: Payer: Self-pay

## 2021-09-08 ENCOUNTER — Encounter: Payer: Self-pay | Admitting: Student

## 2021-09-08 ENCOUNTER — Ambulatory Visit: Payer: Medicaid Other | Admitting: Student

## 2021-09-08 VITALS — BP 164/100 | HR 80 | Temp 98.0°F | Ht 69.0 in | Wt 240.0 lb

## 2021-09-08 DIAGNOSIS — I10 Essential (primary) hypertension: Secondary | ICD-10-CM

## 2021-09-08 DIAGNOSIS — N1832 Chronic kidney disease, stage 3b: Secondary | ICD-10-CM

## 2021-09-08 MED ORDER — CLONIDINE 0.3 MG/24HR TD PTWK: 0.3000 mg | MEDICATED_PATCH | TRANSDERMAL | 12 refills | Status: DC

## 2021-09-08 NOTE — Progress Notes (Signed)
Patient referred by [name], MD for hypertensive heart disease  Subjective:   [name], female    DOB: 1971/08/06, 51 y.o.   MRN: [id number]   Chief Complaint  Patient presents with   Hypertension   Follow-up    51 y.o. African-American female with hypertension, uncontrolled type 2 diabetes mellitus, hyperlipidemia, tobacco abuse, recurrent strokes.  Patient presents for 4-week follow-up of resistant hypertension.  Last office visit unfortunately due to cost constraints patient had not been taking BiDil or labetalol as previously prescribed.  During last office visit a significant portion of time was spent trying to find patient financial assistance in order to obtain her medications as unfortunately no samples were available.   Patient reports that since last visit she was able to pick up BiDil as well as labetalol and reports medication compliance.  However unfortunately she did not bring medications with her to today's office visit, so complete medication reconciliation is difficult.   I did personally review external records from patient's nephrologist Dr. Moshe Cipro at [facility]  She was seen by Dr. Moshe Cipro 08/05/2021 at which time blood pressure was relatively well controlled and Dr. Moshe Cipro did thorough medication reconciliation with patient.  Patient does admit to some dietary noncompliance.  She denies chest pain, palpitations, dizziness, syncope, near syncope.  Denies symptoms suggestive of CVA/TIA.   Current Outpatient Medications on File Prior to Visit  Medication Sig Dispense Refill   Accu-Chek FastClix Lancets MISC 4 (four) times daily.     ACCU-CHEK GUIDE test strip 1 each by Other route See admin instructions. for testing 100 each 2   albuterol (VENTOLIN HFA) 108 (90 Base) MCG/ACT inhaler Inhale into the lungs every 6 (six) hours as needed for wheezing or shortness of breath.     amLODipine-benazepril (LOTREL) 10-40 MG  capsule Take 1 capsule by mouth daily.     aspirin 81 MG EC tablet Take 1 tablet (81 mg total) by mouth daily. 90 tablet 2   blood glucose meter kit and supplies KIT Dispense based on patient and insurance preference. Use up to four times daily as directed. (FOR ICD-9 250.00, 250.01). 1 each 0   Cholecalciferol (VITAMIN D3) 25 MCG (1000 UT) CAPS Take 1 capsule by mouth daily.     fluticasone (FLONASE) 50 MCG/ACT nasal spray Place 2 sprays into both nostrils daily as needed for allergies or rhinitis.     furosemide (LASIX) 40 MG tablet Take 1 tablet (40 mg total) by mouth daily. 120 tablet 3   glipiZIDE (GLUCOTROL) 5 MG tablet Take 0.5 tablets (2.5 mg total) by mouth daily before breakfast. 15 tablet 0   insulin aspart protamine- aspart (NOVOLOG MIX 70/30) (70-30) 100 UNIT/ML injection Inject 0.15 mLs (15 Units total) into the skin 2 (two) times daily with a meal. (Patient taking differently: Inject 15-17 Units into the skin See admin instructions. Inject 15 units subcutaneous in the morning, then inject 17 units subcutaneous in the evening) 10 mL 11   Insulin Syringes, Disposable, V-956 1 ML MISC 1 application by Does not apply route 2 (two) times a day. 100 each 0   ipratropium-albuterol (DUONEB) 0.5-2.5 (3) MG/3ML SOLN Take 3 mLs by nebulization every 4 (four) hours as needed. (Patient taking differently: Take 3 mLs by nebulization every 4 (four) hours as needed (sob/wheezing).) 360 mL 0   isosorbide-hydrALAZINE (BIDIL) 20-37.5 MG tablet Take 2 tablets by mouth 3 (three) times daily. 540 tablet 3   labetalol (NORMODYNE) 300 MG tablet  Take 300 mg by mouth 2 (two) times daily.     metFORMIN (GLUCOPHAGE) 1000 MG tablet Take 1,000 mg by mouth 2 (two) times daily.     rosuvastatin (CRESTOR) 40 MG tablet Take 40 mg by mouth daily.     No current facility-administered medications on file prior to visit.    Cardiovascular studies: Carotid artery duplex 07/28/2021: Right Carotid: Velocities in the right  ICA are consistent with a 1-39% stenosis.  Left Carotid: Velocities in the left ICA are consistent with a 1-39% stenosis.  Vertebrals: Bilateral vertebral arteries demonstrate antegrade flow.  PCV ECHOCARDIOGRAM COMPLETE 07/27/2021 Left ventricle cavity is normal in size. Severe concentric hypertrophy of the left ventricle with mild LVOT obstruction. Max gradient 10 mmHg.  Normal global wall motion. Normal LV systolic function with EF 55%. Indeterminate diastolic filling pattern. Structurally normal trileaflet aortic valve. No evidence of aortic stenosis. Moderate (Grade II) aortic regurgitation. Mild (Grade I) mitral regurgitation. Mild tricuspid regurgitation. Estimated pulmonary artery systolic pressure 27 mmHg. Mild pulmonic regurgitation. No significant change compared to previous study on 10/16/2019.  EKG 11/26/2020: Sinus rhythm 88 bpm Frequent PACs  Left atrial enlargement Old anteroseptal infarct    Echocardiogram 06/25/2020:   1. Left ventricular ejection fraction, by estimation, is 60 to 65%. The  left ventricle has normal function. The left ventricle has no regional  wall motion abnormalities. There is severe left ventricular hypertrophy.  Left ventricular diastolic parameters   were normal.   2. Right ventricular systolic function is normal. The right ventricular  size is normal. There is normal pulmonary artery systolic pressure. The  estimated right ventricular systolic pressure is 27.7 mmHg.   3. A small pericardial effusion is present.   4. The mitral valve is normal in structure. No evidence of mitral valve  regurgitation.   5. The aortic valve was not well visualized. Aortic valve regurgitation  is mild. No aortic stenosis is present.   6. The inferior vena cava is dilated in size with >50% respiratory  variability, suggesting right atrial pressure of 8 mmHg.   Event monitor 09/17/2019 - 10/16/2019: Diagnostic time: 94%  Dominant rhythm: Sinus. HR 66-112 bpm. Avg  HR 80 bpm. 6 episodes of NSVT up to 9 beats seen.  No atrial fibrillation/atrial flutter/SVT/high grade AV block, sinus pause >3sec noted. Symptoms reported: None  MRI brain 08/20/2019: - Right frontal parasagittal and anterior corpus callosum lesion which is hyperintense on DWI, slightly hypointense on ADC map, hyperintense on T2 and FLAIR views, with heterogeneous enhancement.  Considerations could include subacute infarction, autoimmune, inflammatory or neoplastic etiologies. This is a new finding compared to 02/11/2019. - Mild scattered periventricular, subcortical, pontine chronic small vessel ischemic disease.  CTA head/neck 02/12/2019: 1. Diffuse distal branch vessel irregularity throughout the circle-of-Willis without a significant proximal stenosis or occlusion. 2. Atherosclerotic changes are evident within the cavernous internal carotid arteries and right greater than left M1 segments with diffuse vessel irregularity and mild narrowing of less than 50% relative to the more distal vessels. 3. Tortuosity of the cervical internal carotid arteries and vertebral arteries bilaterally without significant stenosis. This is nonspecific, but often seen in the setting of chronic hypertension. 4. Brainstem infarct and white matter parenchymal changes noted on MRI are not discernible by CT.  MRI brain 02/11/2019: 1. Patchy small volume acute ischemic nonhemorrhagic infarct involving the left lateral midbrain/pons. 2. Underlying mild chronic microvascular ischemic disease.   Recent labs: 07/27/2021: BNP 159 Creatinine 2.13, GFR 28, sodium 140, potassium 4.5  07/21/2021: Glucose 119, BUN/Cr 19/1.94. EGFR 31. Na/K 139/3.7. Rest of the CMP normal H/H 10.7/33.5. MCV 92. Platelets 270 HbA1C 8.6% Chol 88, TG 153, HDL 33, LDL 29 BNP 377 Trop 24-->26  06/25/2020: Glucose 136, BUN/Cr 18/1.49. EGFR 43. Na/K 139/3.5.  H/H 7.7/26.6. MCV 72.7. Platelets 249 Results for SAHVANNA, MCMANIGAL (MRN  [id number]) as of 07/15/2020 16:09  Ref. Range 06/24/2020 21:30 06/24/2020 23:14  B Natriuretic Peptide Latest Ref Range: 0.0 - 100.0 pg/mL 427.9 (H)   Troponin I (High Sensitivity) Latest Ref Range: <18 ng/L 17 18 (H)     11/28/2019: Glucose 177, BUN/Cr 33/2.33. EGFR 28. Na/K 138/4.0. Rest of the CMP normal H/H 11/32. MCV 86. Platelets 254 Chol 167, TG 287, HDL 39, LDL 81  03/12/2019: Glucose 170.  BUN/creatinine 26/1.3.  EGFR 57.  NA/K1 39/4.2. H/H 12.6/37.5.  MCV 91.  Platelets 245.  Normal iron studies. Hemoglobin A1c 8.5%. Cholesterol 106, triglycerides 103, HDL 38, LDL 47   Review of Systems  Cardiovascular:  Positive for dyspnea on exertion (stable). Negative for chest pain, claudication, leg swelling (improved), near-syncope, orthopnea, palpitations, paroxysmal nocturnal dyspnea and syncope.  Neurological:  Negative for dizziness.       Foot drop        Vitals:   09/08/21 0910 09/08/21 0917  BP: (!) 168/98 (!) 164/100  Pulse: 78 80  Temp: 98 F (36.7 C)   SpO2: 99% 98%     Objective:   Physical Exam Vitals reviewed.  Constitutional:      Appearance: She is obese.  Neck:     Vascular: No JVD.  Cardiovascular:     Rate and Rhythm: Normal rate and regular rhythm.     Pulses: Intact distal pulses.     Heart sounds: Murmur heard.  Early systolic murmur is present with a grade of 2/6 at the lower right sternal border.  Pulmonary:     Effort: Pulmonary effort is normal.     Breath sounds: Normal breath sounds.  Musculoskeletal:     Right lower leg: Edema (trace) present.     Left lower leg: Edema (trace) present.  Neurological:     General: No focal deficit present.     Mental Status: She is oriented to person, place, and time.     Cranial Nerves: No cranial nerve deficit.       Assessment & Recommendations:   51 y.o. 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  Resistant hypertension: I personally reviewed external records from [facility], patient's blood pressure was well controlled at last office visit on 08/05/2021.  Unfortunately today patient does not bring with her her medications, and therefore medication reconciliation is difficult.  Patient reports medication compliance, however this has been a longstanding issue in the past.  Patient does admit to dietary noncompliance with some high sodium intake, therefore reiterated the importance of both diet and medication compliance. [name] blood pressure is uncontrolled in the office today, unfortunately nursing home readings available for review.  Discussed with patient that blood pressure was well controlled at last visit with nephrologist, therefore if patient can adhere to strict medication and diet compliance blood pressure will likely be notably well controlled.  However did review with her option of adding clonidine patch given continued labile blood pressure. Patient would like to start clonidine patch. Discussed potentially side effects to let our office know of.  Will continue BiDil,  amlodipine/benazepril, labetalol.   Stage 3b CKD:  Patient follows with nephrology.  This is likely secondary to poorly controlled hypertension as well as diabetic kidney disease.  History of stroke: No Afib on event monitor.  Continue Aspirin, statin.  Type 2 diabetes mellitus: Uncontrolled.  Management as per PCP.  Patient has difficulty keeping appointments due to financial issues as well as transportation constraints.  She has upcoming appointment with pulmonology on 09/28/2021.  She also states she will be able to use her sisters blood pressure cuff at home for blood pressure monitoring.  We will therefore schedule patient for 12-monthoffice visit.  However our office will reach out to obtain home blood pressure readings in about 3 weeks.  I  am concerned that continued resistant hypertension is related to poor medication and diet compliance.  If patient could improve medication compliance as well as reduced sodium intake suspect she would not need addition of clonidine given that blood pressure is well controlled at last nephrology visit.  We will reevaluate going forward.    This was a 35-minute encounter with face-to-face counseling, medical records review, coordination of care, explanation of complex medical issues, complex medical decision making.     CAlethia Berthold PA-C 09/08/2021, 10:12 AM Office: [phone]

## 2021-09-27 NOTE — Progress Notes (Signed)
HPI 51 year old female smoker followed for OSA, complicated by arthritis, DM 2, HBP, pseudotumor cerebri NPSG 07/03/17- AHI 10.1/ hr, desaturation to 89%, body weight 212 lbs, + PLMA   ------------------------------------------------------------------------------------------------------   09/28/20- 51 year old female Smoker followed for OSA, complicated by arthritis, DM 2, HTN, pseudotumor cerebri, CVA, CHF, CAD, NSVT, CKD, Obesity, Anemia,  CPAP auto 10-20/Adapt begun 08/25/2017. Download- compliance 77%, AHI 1.8/ hr Body weight today-249 lbs Covid vax-none declines Flu vax-none Hosp November, 2021 w dyspnea, anemia/ hgb 7.7, CHF Reviewed download. Sometimes orthopnea, has to prop up and take CPAP off, feeling smothered. Aware BP runs high. Pending iron infusion this month. Scant clear phlegm with little cough or wheeze.  CXR 06/24/20- IMPRESSION: Mild cardiomegaly and pulmonary vascular congestion. Patchy airspace opacity at the right lung base which could be due to atelectasis and/or infectious etiology.  09/28/21- 51 year old female Smoker followed for OSA, complicated by arthritis, DM 2, HTN, pseudotumor cerebri, CVA, CHF, CAD, NSVT, CKD, Obesity, Anemia,  CPAP auto 10-20/Adapt begun 08/25/2017.   AirSense 10 AutoSet Download- compliance 67%, AHI 2.9/ hr Body weight today-236 lbs Covid vax-no Flu vax-no Followed by Nephrology and Cardiology for HTN -----Patient would like a different mask. Sleeping ok.  Arrival BP 160/100-discussed.  She has an upcoming appointment to follow-up on this. She indicates that ill fitting mask impacts her compliance with CPAP use and we discussed this.  She can contact her DME company and we can refer for mask fitting at sleep center.  Download reviewed.  Pressure range is appropriate. She denies breathing problems except that she uses rescue inhaler about once every week or 2 for mild wheeze. CXR 07/21/21- IMPRESSION: Cardiomegaly with development of  mild congestive heart failure.  ROS-see HPI   + = positive Constitutional:    weight loss, night sweats, fevers, chills,+ fatigue, lassitude. HEENT:    headaches, difficulty swallowing, tooth/dental problems, sore throat,       sneezing, itching, ear ache, nasal congestion, post nasal drip, snoring CV:    chest pain, orthopnea, PND, swelling in lower extremities, anasarca,                                                  dizziness, palpitations Resp:   +shortness of breath with exertion or at rest.                +productive cough,   non-productive cough, coughing up of blood.              change in color of mucus.  wheezing.   Skin:    rash or lesions. GI:  No-   heartburn, indigestion, abdominal pain, nausea, vomiting, diarrhea,                 change in bowel habits, loss of appetite GU: dysuria, change in color of urine, no urgency or frequency.   flank pain. MS:   joint pain, stiffness, decreased range of motion, back pain. Neuro-     nothing unusual Psych:  change in mood or affect.  depression or anxiety.   memory loss.  OBJ- Physical Exam   BP 160/100 on arrival General- Alert, Oriented, Affect-appropriate, Distress- none acute, + obese Skin- rash-none, lesions- none, excoriation- none  Lymphadenopathy- none Head- atraumatic            Eyes- Gross vision intact, PERRLA, conjunctivae  and secretions clear            Ears- Hearing, canals-normal            Nose- Clear, no-Septal dev, mucus, polyps, erosion, perforation             Throat- Mallampati III , mucosa clear , drainage- none, tonsils- atrophic,  + tongue stud Neck- flexible , trachea midline, no stridor , thyroid nl, carotid no bruit Chest - symmetrical excursion , unlabored           Heart/CV- RRR , no murmur , no gallop  , no rub, nl s1 s2                           - JVD- none , edema- none, stasis changes- none, varices- none           Lung- clear to P&A, wheeze- none, cough- none , dullness-none, rub- none            Chest wall-  Abd-  Br/ Gen/ Rectal- Not done, not indicated Extrem- +walking shoe R foot (cut toe) Neuro- grossly intact to observation

## 2021-09-28 ENCOUNTER — Ambulatory Visit: Payer: Medicaid Other | Admitting: Internal Medicine

## 2021-09-28 ENCOUNTER — Other Ambulatory Visit: Payer: Self-pay

## 2021-09-28 ENCOUNTER — Encounter: Payer: Self-pay | Admitting: Internal Medicine

## 2021-09-28 VITALS — BP 160/100 | HR 108 | Temp 98.2°F | Ht 69.0 in | Wt 236.6 lb

## 2021-09-28 DIAGNOSIS — I10 Essential (primary) hypertension: Secondary | ICD-10-CM | POA: Diagnosis not present

## 2021-09-28 DIAGNOSIS — G4733 Obstructive sleep apnea (adult) (pediatric): Secondary | ICD-10-CM | POA: Diagnosis not present

## 2021-09-28 NOTE — Patient Instructions (Signed)
Order- refer to sleep center for mask fitting  Please try to use your CPAP whenever you sleep to help your heart.  Please call us if we can help

## 2021-09-28 NOTE — Assessment & Plan Note (Signed)
Benefits from CPAP when used.  Discussed compliance goals. Plan-refer for mask fitting

## 2021-09-28 NOTE — Assessment & Plan Note (Signed)
She has been followed by cardiology and nephrology and indicates she has an upcoming appointment soon to recheck on blood pressure.

## 2021-09-30 ENCOUNTER — Emergency Department (HOSPITAL_COMMUNITY): Payer: Medicaid Other

## 2021-09-30 ENCOUNTER — Encounter (HOSPITAL_COMMUNITY): Payer: Self-pay

## 2021-09-30 ENCOUNTER — Emergency Department (HOSPITAL_COMMUNITY)
Admission: EM | Admit: 2021-09-30 | Discharge: 2021-09-30 | Disposition: A | Discharge status: Home / Self Care | Payer: Medicaid Other | Attending: Emergency Medicine | Admitting: Emergency Medicine

## 2021-09-30 DIAGNOSIS — Z7982 Long term (current) use of aspirin: Secondary | ICD-10-CM | POA: Insufficient documentation

## 2021-09-30 DIAGNOSIS — Z794 Long term (current) use of insulin: Secondary | ICD-10-CM | POA: Diagnosis not present

## 2021-09-30 DIAGNOSIS — I10 Essential (primary) hypertension: Secondary | ICD-10-CM | POA: Diagnosis not present

## 2021-09-30 DIAGNOSIS — S161XXA Strain of muscle, fascia and tendon at neck level, initial encounter: Secondary | ICD-10-CM | POA: Diagnosis not present

## 2021-09-30 DIAGNOSIS — S0083XA Contusion of other part of head, initial encounter: Secondary | ICD-10-CM | POA: Insufficient documentation

## 2021-09-30 DIAGNOSIS — Z79899 Other long term (current) drug therapy: Secondary | ICD-10-CM | POA: Diagnosis not present

## 2021-09-30 DIAGNOSIS — Y92410 Unspecified street and highway as the place of occurrence of the external cause: Secondary | ICD-10-CM | POA: Insufficient documentation

## 2021-09-30 DIAGNOSIS — M545 Low back pain, unspecified: Secondary | ICD-10-CM | POA: Diagnosis not present

## 2021-09-30 DIAGNOSIS — S0093XA Contusion of unspecified part of head, initial encounter: Secondary | ICD-10-CM

## 2021-09-30 DIAGNOSIS — S199XXA Unspecified injury of neck, initial encounter: Secondary | ICD-10-CM | POA: Diagnosis present

## 2021-09-30 MED ORDER — METHOCARBAMOL 750 MG PO TABS: 750.0000 mg | ORAL_TABLET | Freq: Three times a day (TID) | ORAL | 0 refills | Status: DC | PRN

## 2021-09-30 MED ORDER — METHOCARBAMOL 500 MG PO TABS
750.0000 mg | ORAL_TABLET | Freq: Once | ORAL | Status: AC
  Administered 2021-09-30: 750 mg via ORAL
  Filled 2021-09-30: qty 2

## 2021-09-30 MED ORDER — ACETAMINOPHEN 500 MG PO TABS
1000.0000 mg | ORAL_TABLET | Freq: Once | ORAL | Status: AC
  Administered 2021-09-30: 1000 mg via ORAL
  Filled 2021-09-30: qty 2

## 2021-09-30 NOTE — Discharge Instructions (Addendum)
It was our pleasure to provide your ER care today - we hope that you feel better.  Take acetaminophen as need. You may also take robaxin as need for muscle pain/spasm - no driving for the next 6 hours or when taking robaxin.  Follow up with primary care doctor in two weeks if symptoms fail to improve/resolve. Also have your blood pressure rechecked then, as it is high today.  Return to ER if worse, new symptoms, severe pain, numbness/weakness, trouble breathing, severe abdominal pain, or other concern.
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
## 2021-10-12 ENCOUNTER — Other Ambulatory Visit (HOSPITAL_BASED_OUTPATIENT_CLINIC_OR_DEPARTMENT_OTHER): Payer: Medicaid Other | Admitting: Internal Medicine

## 2021-10-25 ENCOUNTER — Ambulatory Visit (HOSPITAL_BASED_OUTPATIENT_CLINIC_OR_DEPARTMENT_OTHER): Payer: Medicaid Other | Attending: Internal Medicine | Admitting: Internal Medicine

## 2021-10-25 DIAGNOSIS — G4733 Obstructive sleep apnea (adult) (pediatric): Secondary | ICD-10-CM

## 2021-10-26 ENCOUNTER — Other Ambulatory Visit: Payer: Self-pay

## 2021-10-26 ENCOUNTER — Encounter: Payer: Self-pay | Admitting: Podiatry

## 2021-10-26 ENCOUNTER — Ambulatory Visit: Payer: Medicaid Other | Admitting: Podiatry

## 2021-10-26 DIAGNOSIS — L84 Corns and callosities: Secondary | ICD-10-CM

## 2021-10-26 DIAGNOSIS — M2041 Other hammer toe(s) (acquired), right foot: Secondary | ICD-10-CM

## 2021-10-26 DIAGNOSIS — F419 Anxiety disorder, unspecified: Secondary | ICD-10-CM | POA: Insufficient documentation

## 2021-10-26 DIAGNOSIS — M79675 Pain in left toe(s): Secondary | ICD-10-CM

## 2021-10-26 DIAGNOSIS — E559 Vitamin D deficiency, unspecified: Secondary | ICD-10-CM | POA: Insufficient documentation

## 2021-10-26 DIAGNOSIS — I119 Hypertensive heart disease without heart failure: Secondary | ICD-10-CM | POA: Insufficient documentation

## 2021-10-26 DIAGNOSIS — E1142 Type 2 diabetes mellitus with diabetic polyneuropathy: Secondary | ICD-10-CM

## 2021-10-26 DIAGNOSIS — Z794 Long term (current) use of insulin: Secondary | ICD-10-CM | POA: Insufficient documentation

## 2021-10-26 DIAGNOSIS — M2042 Other hammer toe(s) (acquired), left foot: Secondary | ICD-10-CM

## 2021-10-26 DIAGNOSIS — N920 Excessive and frequent menstruation with regular cycle: Secondary | ICD-10-CM | POA: Insufficient documentation

## 2021-10-26 DIAGNOSIS — Z89411 Acquired absence of right great toe: Secondary | ICD-10-CM

## 2021-10-26 DIAGNOSIS — F4322 Adjustment disorder with anxiety: Secondary | ICD-10-CM | POA: Insufficient documentation

## 2021-10-26 DIAGNOSIS — E1165 Type 2 diabetes mellitus with hyperglycemia: Secondary | ICD-10-CM | POA: Insufficient documentation

## 2021-10-26 DIAGNOSIS — E119 Type 2 diabetes mellitus without complications: Secondary | ICD-10-CM

## 2021-10-26 DIAGNOSIS — Z720 Tobacco use: Secondary | ICD-10-CM | POA: Insufficient documentation

## 2021-10-26 DIAGNOSIS — J4520 Mild intermittent asthma, uncomplicated: Secondary | ICD-10-CM | POA: Insufficient documentation

## 2021-10-26 DIAGNOSIS — J301 Allergic rhinitis due to pollen: Secondary | ICD-10-CM | POA: Insufficient documentation

## 2021-10-26 DIAGNOSIS — M79674 Pain in right toe(s): Secondary | ICD-10-CM | POA: Diagnosis not present

## 2021-10-26 DIAGNOSIS — N946 Dysmenorrhea, unspecified: Secondary | ICD-10-CM | POA: Insufficient documentation

## 2021-10-26 DIAGNOSIS — N183 Chronic kidney disease, stage 3 unspecified: Secondary | ICD-10-CM | POA: Insufficient documentation

## 2021-10-26 DIAGNOSIS — B351 Tinea unguium: Secondary | ICD-10-CM

## 2021-10-26 NOTE — Patient Instructions (Signed)
-Recommend Skechers Loafers with stretchable uppers and memory foam insoles. They can be purchased at [employer], [employer], or [employer]. Also on [email or web address].    ? ?Diabetes Mellitus and Foot Care ?Foot care is an important part of your health, especially when you have diabetes. Diabetes may cause you to have problems because of poor blood flow (circulation) to your feet and legs, which can cause your skin to: ?Become thinner and drier. ?Break more easily. ?Heal more slowly. ?Peel and crack. ?You may also have nerve damage (neuropathy) in your legs and feet, causing decreased feeling in them. This means that you may not notice minor injuries to your feet that could lead to more serious problems. Noticing and addressing any potential problems early is the best way to prevent future foot problems. ?How to care for your feet ?Foot hygiene ? ?Wash your feet daily with warm water and mild soap. Do not use hot water. Then, pat your feet and the areas between your toes until they are completely dry. Do not soak your feet as this can dry your skin. ?Apply a moisturizing lotion or petroleum jelly to the skin on your feet and to dry, brittle toenails. Use lotion that does not contain alcohol and is unscented. Do not apply lotion between your toes. ?Shoes and socks ?Wear clean socks or stockings every day. Make sure they are not too tight. Do not wear knee-high stockings since they may decrease blood flow to your legs. ?Wear shoes that fit properly and have enough cushioning. Always look in your shoes before you put them on to be sure there are no objects inside. ?To break in new shoes, wear them for just a few hours a day. This prevents injuries on your feet. ?Wounds, scrapes, corns, and calluses ? ?Check your feet daily for blisters, cuts, bruises, sores, and redness. If you cannot see the bottom of your feet, use a mirror or ask someone for help. ?Do not cut corns or calluses or try to remove them with  medicine. ?If you find a minor scrape, cut, or break in the skin on your feet, keep it and the skin around it clean and dry. You may clean these areas with mild soap and water. Do not clean the area with peroxide, alcohol, or iodine. ?If you have a wound, scrape, corn, or callus on your foot, look at it several times a day to make sure it is healing and not infected. Check for: ?Redness, swelling, or pain. ?Fluid or blood. ?Warmth. ?Pus or a bad smell. ?General tips ?Do not cross your legs. This may decrease blood flow to your feet. ?Do not use heating pads or hot water bottles on your feet. They may burn your skin. If you have lost feeling in your feet or legs, you may not know this is happening until it is too late. ?Protect your feet from hot and cold by wearing shoes, such as at the beach or on hot pavement. ?Schedule a complete foot exam at least once a year (annually) or more often if you have foot problems. Report any cuts, sores, or bruises to your health care provider immediately. ?Where to find more information ?American Diabetes Association: www.diabetes.org ?Association of Diabetes Care & Education Specialists: www.diabeteseducator.org ?Contact a health care provider if: ?You have a medical condition that increases your risk of infection and you have any cuts, sores, or bruises on your feet. ?You have an injury that is not healing. ?You have redness on your legs or  feet. ?You feel burning or tingling in your legs or feet. ?You have pain or cramps in your legs and feet. ?Your legs or feet are numb. ?Your feet always feel cold. ?You have pain around any toenails. ?Get help right away if: ?You have a wound, scrape, corn, or callus on your foot and: ?You have pain, swelling, or redness that gets worse. ?You have fluid or blood coming from the wound, scrape, corn, or callus. ?Your wound, scrape, corn, or callus feels warm to the touch. ?You have pus or a bad smell coming from the wound, scrape, corn, or  callus. ?You have a fever. ?You have a red line going up your leg. ?Summary ?Check your feet every day for blisters, cuts, bruises, sores, and redness. ?Apply a moisturizing lotion or petroleum jelly to the skin on your feet and to dry, brittle toenails. ?Wear shoes that fit properly and have enough cushioning. ?If you have foot problems, report any cuts, sores, or bruises to your health care provider immediately. ?Schedule a complete foot exam at least once a year (annually) or more often if you have foot problems. ?This information is not intended to replace advice given to you by your health care provider. Make sure you discuss any questions you have with your health care provider. ?Document Revised: 02/13/2020 Document Reviewed: 02/13/2020 ?Elsevier Patient Education ? [place]. ? ?

## 2021-10-31 NOTE — Progress Notes (Signed)
ANNUAL DIABETIC FOOT EXAM ? ?Subjective: ?[name] presents today for annual diabetic foot examination. ? ?Patient relates 51 year h/o diabetes. ? ?Patient denies any h/o partial amputation of right great toe. ? ?Patient has been diagnosed with neuropathy. ? ?Patient did not check blood glucose this morning. ? ?Risk factors:  partial amputation of digit, h/o CVA, HTN, CAD, CKD, hyperlipidemia, tobacco dependence, history of foot/leg ulcer. ? ?[name], MD is patient's PCP. Last visit was October 18, 2021. ? ?Past Medical History:  ?Diagnosis Date  ? Arthritis   ? Asthma   ? Cataract   ? Mixed form OU  ? CKD (chronic kidney disease)   ? Coronary artery disease   ? Diabetes mellitus   ? Diabetic retinopathy ([place])   ? NPDR OU  ? Hyperlipidemia   ? Hypertension   ? Hypertensive retinopathy   ? OU  ? Left thyroid nodule   ? diagnosed 07/2018  ? PAC (premature atrial contraction) 02/15/2021  ? Pseudotumor cerebri   ? Stroke [facility])   ? Vitamin D deficiency   ? ?Patient Active Problem List  ? Diagnosis Date Noted  ? Adjustment disorder with anxious mood 10/26/2021  ? Allergic rhinitis due to pollen 10/26/2021  ? Anxiety 10/26/2021  ? Dysmenorrhea 10/26/2021  ? Hyperglycemia due to type 2 diabetes mellitus ([place]) 10/26/2021  ? Hypertensive heart disease without congestive heart failure 10/26/2021  ? Long term (current) use of insulin ([place]) 10/26/2021  ? Menorrhagia 10/26/2021  ? Mild intermittent asthma 10/26/2021  ? Stage 3 chronic kidney disease ([place]) 10/26/2021  ? Tobacco abuse 10/26/2021  ? Vitamin D deficiency 10/26/2021  ? Leg edema 07/22/2021  ? Chronic osteomyelitis of right foot with draining sinus (HCC)   ? Influenza A   ? Diabetic foot ulcer ([place]) 05/27/2021  ? PAC (premature atrial contraction) 02/15/2021  ? Symptomatic anemia 06/25/2020  ? Chronic heart failure with preserved ejection fraction ([place]) 06/25/2020  ? Educated about COVID-19 virus infection 04/29/2020  ? Class 2 severe obesity with  serious comorbidity and body mass index (BMI) of 36.0 to 36.9 in adult [facility]) 03/30/2020  ? NSVT (nonsustained ventricular tachycardia) 11/13/2019  ? Pericardial effusion 09/17/2019  ? H/O: stroke 04/09/2019  ? Left ventricular hypertrophy 04/09/2019  ? Mixed hyperlipidemia 04/08/2019  ? Uncontrolled type 2 diabetes mellitus with hyperglycemia ([place]) 04/08/2019  ? Non compliance w medication regimen 02/20/2019  ? Diabetes mellitus type 2 in obese [facility])   ? Resistant hypertension   ? 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[place]) 02/13/2019  ? Cerebrovascular accident (CVA) ([place])   ? Hypokalemia   ? Hypomagnesemia   ? AKI (acute kidney injury) ([place])   ? Acute CVA (cerebrovascular accident) ([place]) 02/11/2019  ? Hypertensive urgency 02/11/2019  ? ARF (acute renal failure) ([place]) 02/11/2019  ? Type 2 diabetes mellitus with vascular disease ([place]) 02/11/2019  ? Enlargement of thyroid 07/13/2018  ? Type 2 diabetes with complication ([place]) 46/50/3546  ? Essential hypertension 01/08/2018  ? Obstructive sleep apnea 08/26/2017  ? ?Past Surgical History:  ?Procedure Laterality Date  ? ACHILLES TENDON REPAIR    ? ACHILLES TENDON SURGERY Left 02/19/2021  ? Procedure: ACHILLES TENDON REPAIR WITH GRAFT;  Surgeon: Felipa Furnace, DPM;  Location: [place];  Service: Podiatry;  Laterality: Left;  BLOCK  ? AMPUTATION TOE Right 05/28/2021  ? Procedure: AMPUTATION TOE;  Surgeon: Lorenda Peck, MD;  Location: [place];  Service: Podiatry;  Laterality: Right;  Surgical team will do block  ? GASTROC RECESSION  EXTREMITY Left 02/19/2021  ? Procedure: GASTROC RECESSION EXTREMITY;  Surgeon: Felipa Furnace, DPM;  Location: [place];  Service: Podiatry;  Laterality: Left;  Block  ? TUBAL LIGATION    ? ?Current Outpatient Medications on File Prior to Visit  ?Medication Sig Dispense Refill  ? Accu-Chek FastClix Lancets MISC 4 (four) times daily.    ? ACCU-CHEK GUIDE test strip 1 each by Other route See admin instructions. for testing 100 each  2  ? albuterol (VENTOLIN HFA) 108 (90 Base) MCG/ACT inhaler Inhale into the lungs every 6 (six) hours as needed for wheezing or shortness of breath.    ? amLODipine-benazepril (LOTREL) 10-40 MG capsule Take 1 capsule by mouth daily.    ? aspirin 81 MG EC tablet Take 1 tablet (81 mg total) by mouth daily. 90 tablet 2  ? blood glucose meter kit and supplies KIT Dispense based on patient and insurance preference. Use up to four times daily as directed. (FOR ICD-9 250.00, 250.01). 1 each 0  ? busPIRone (BUSPAR) 5 MG tablet Take 5 mg by mouth 2 (two) times daily.    ? Cholecalciferol (VITAMIN D3) 25 MCG (1000 UT) CAPS Take 1 capsule by mouth daily.    ? cloNIDine (CATAPRES - DOSED IN MG/24 HR) 0.3 mg/24hr patch Place 1 patch (0.3 mg total) onto the skin once a week. 4 patch 12  ? cyclobenzaprine (FLEXERIL) 5 MG tablet Take 5 mg by mouth 3 (three) times daily as needed.    ? fluticasone (FLONASE) 50 MCG/ACT nasal spray Place 2 sprays into both nostrils daily as needed for allergies or rhinitis.    ? furosemide (LASIX) 40 MG tablet Take 1 tablet (40 mg total) by mouth daily. 120 tablet 3  ? glipiZIDE (GLUCOTROL) 5 MG tablet Take 0.5 tablets (2.5 mg total) by mouth daily before breakfast. 15 tablet 0  ? hydrOXYzine (VISTARIL) 25 MG capsule Take 25 mg by mouth at bedtime as needed.    ? insulin aspart protamine- aspart (NOVOLOG MIX 70/30) (70-30) 100 UNIT/ML injection Inject 0.15 mLs (15 Units total) into the skin 2 (two) times daily with a meal. (Patient taking differently: Inject 15-17 Units into the skin See admin instructions. Inject 15 units subcutaneous in the morning, then inject 17 units subcutaneous in the evening) 10 mL 11  ? Insulin Syringes, Disposable, F-621 1 ML MISC 1 application by Does not apply route 2 (two) times a day. 100 each 0  ? ipratropium-albuterol (DUONEB) 0.5-2.5 (3) MG/3ML SOLN Take 3 mLs by nebulization every 4 (four) hours as needed. (Patient taking differently: Take 3 mLs by nebulization every  4 (four) hours as needed (sob/wheezing).) 360 mL 0  ? isosorbide-hydrALAZINE (BIDIL) 20-37.5 MG tablet Take 2 tablets by mouth 3 (three) times daily. 540 tablet 3  ? labetalol (NORMODYNE) 300 MG tablet Take 300 mg by mouth 2 (two) times daily.    ? metFORMIN (GLUCOPHAGE) 1000 MG tablet Take 1,000 mg by mouth 2 (two) times daily.    ? methocarbamol (ROBAXIN) 750 MG tablet Take 1 tablet (750 mg total) by mouth every 8 (eight) hours as needed (muscle spasm/pain). 15 tablet 0  ? rosuvastatin (CRESTOR) 40 MG tablet Take 40 mg by mouth daily.    ? ?No current facility-administered medications on file prior to visit.  ?  ?Allergies  ?Allergen Reactions  ? Bee Pollen Other (See Comments)  ?  Seasonal Allergies  ? Dust Mite Extract Other (See Comments)  ?  Sneezing & Cough  ?  Mixed Ragweed Itching  ? ?Social History  ? ?Occupational History  ? Occupation: unemployed  ?Tobacco Use  ? Smoking status: Former  ?  Packs/day: 0.50  ?  Years: 30.00  ?  Pack years: 15.00  ?  Types: Cigarettes  ?  Quit date: 2017  ?  Years since quitting: 6.2  ? Smokeless tobacco: Never  ?Vaping Use  ? Vaping Use: Never used  ?Substance and Sexual Activity  ? Alcohol use: Not Currently  ?  Alcohol/week: 0.0 standard drinks  ?  Comment: rare  ? Drug use: Yes  ?  Types: Marijuana  ?  Comment: daily  ? Sexual activity: Not Currently  ?  Partners: Male  ?  Birth control/protection: Surgical  ? ?Family History  ?Problem Relation Age of Onset  ? Asthma Mother   ? Diabetes Father   ? Hypertension Father   ? Kidney disease Father   ? Hypertension Sister   ? Asthma Sister   ? Congestive Heart Failure Maternal Aunt   ? Diabetes Maternal Grandmother   ? Congestive Heart Failure Maternal Grandmother   ? Lung disease Maternal Grandmother   ? Asthma Other   ? Hyperlipidemia Other   ? Hypertension Other   ? Cancer Other   ? ?Immunization History  ?Administered Date(s) Administered  ? Influenza [place] 04/23/2013  ?  ? ?Review of Systems: Negative except as noted in  the HPI.  ? ?Objective: ?There were no vitals filed for this visit. ? ?CHARO PHILIPP is a pleasant 51 y.o. female in NAD. AAO X 3. ? ?Vascular Examination: ?CFT <3 seconds b/l LE. Palpable DP pulse(s) b/l

## 2021-11-17 NOTE — Progress Notes (Signed)
External labs 11/01/21:  ?TG 715, total 207, HDL 40, LDL 167 ?BUN 34, Cr 2.49, Na 135, K 3.5,  ?A1c 11.0%

## 2021-11-18 ENCOUNTER — Other Ambulatory Visit (HOSPITAL_COMMUNITY)
Admission: RE | Admit: 2021-11-18 | Discharge: 2021-11-18 | Disposition: A | Discharge status: Home / Self Care | Payer: Medicaid Other | Source: Ambulatory Visit | Attending: Obstetrics | Admitting: Obstetrics

## 2021-11-18 ENCOUNTER — Encounter: Payer: Self-pay | Admitting: Obstetrics

## 2021-11-18 ENCOUNTER — Ambulatory Visit (INDEPENDENT_AMBULATORY_CARE_PROVIDER_SITE_OTHER): Payer: Medicaid Other | Admitting: Obstetrics

## 2021-11-18 ENCOUNTER — Ambulatory Visit (INDEPENDENT_AMBULATORY_CARE_PROVIDER_SITE_OTHER): Payer: Medicaid Other | Admitting: Licensed Clinical Social Worker

## 2021-11-18 VITALS — BP 186/101 | HR 81 | Ht 69.0 in | Wt 229.0 lb

## 2021-11-18 DIAGNOSIS — E669 Obesity, unspecified: Secondary | ICD-10-CM

## 2021-11-18 DIAGNOSIS — Z113 Encounter for screening for infections with a predominantly sexual mode of transmission: Secondary | ICD-10-CM | POA: Insufficient documentation

## 2021-11-18 DIAGNOSIS — E139 Other specified diabetes mellitus without complications: Secondary | ICD-10-CM

## 2021-11-18 DIAGNOSIS — N898 Other specified noninflammatory disorders of vagina: Secondary | ICD-10-CM | POA: Insufficient documentation

## 2021-11-18 DIAGNOSIS — Z1239 Encounter for other screening for malignant neoplasm of breast: Secondary | ICD-10-CM

## 2021-11-18 DIAGNOSIS — F329 Major depressive disorder, single episode, unspecified: Secondary | ICD-10-CM

## 2021-11-18 DIAGNOSIS — N951 Menopausal and female climacteric states: Secondary | ICD-10-CM

## 2021-11-18 DIAGNOSIS — Z01419 Encounter for gynecological examination (general) (routine) without abnormal findings: Secondary | ICD-10-CM | POA: Diagnosis not present

## 2021-11-18 DIAGNOSIS — Z8673 Personal history of transient ischemic attack (TIA), and cerebral infarction without residual deficits: Secondary | ICD-10-CM

## 2021-11-18 DIAGNOSIS — Z59819 Housing instability, housed unspecified: Secondary | ICD-10-CM

## 2021-11-18 DIAGNOSIS — I2510 Atherosclerotic heart disease of native coronary artery without angina pectoris: Secondary | ICD-10-CM

## 2021-11-18 DIAGNOSIS — N189 Chronic kidney disease, unspecified: Secondary | ICD-10-CM

## 2021-11-18 DIAGNOSIS — I10 Essential (primary) hypertension: Secondary | ICD-10-CM

## 2021-11-18 NOTE — Progress Notes (Signed)
Patient presents for annual and pap smear.  ?Mammogram due - Last Mammogram 05/06/20 ?PHQ9=15 ?GAD7= 19 ?. ?

## 2021-11-18 NOTE — Progress Notes (Signed)
? ?Subjective: ? ? ?  ?  ? [name] is a 51 y.o. female here for a routine exam.  Current complaints: Vaginal discharge.  .   ? ?Personal health questionnaire:  ?Is patient Ashkenazi Jewish, have a family history of breast and/or ovarian cancer: no ?Is there a family history of uterine cancer diagnosed at age < 60, gastrointestinal cancer, urinary tract cancer, family member who is a Field seismologist syndrome-associated carrier: no ?Is the patient overweight and hypertensive, family history of diabetes, personal history of gestational diabetes, preeclampsia or PCOS: yes ?Is patient over 43, have PCOS,  family history of premature CHD under age 65, diabetes, smoke, have hypertension or peripheral artery disease:  yes ?At any time, has a partner hit, kicked or otherwise hurt or frightened you?: no ?Over the past 2 weeks, have you felt down, depressed or hopeless?: yes ?Over the past 2 weeks, have you felt little interest or pleasure in doing things?:yes ? ? ?Gynecologic History ?No LMP recorded. (Menstrual status: Irregular Periods). ?Contraception: tubal ligation ?Last Pap: 12-12-2019. Results were: normal ?Last mammogram: 2021. Results were: normal ? ?Obstetric History ?OB History  ?Gravida Para Term Preterm AB Living  ?4 4 4     4   ?SAB IAB Ectopic Multiple Live Births  ?        4  ?  ?# Outcome Date GA Lbr Len/2nd Weight Sex Delivery Anes PTL Lv  ?4 Term      CS-LTranv     ?3 Term      Vag-Spont     ?2 Term      Vag-Spont     ?1 Term      Vag-Spont     ? ? ?Past Medical History:  ?Diagnosis Date  ? Arthritis   ? Asthma   ? Cataract   ? Mixed form OU  ? CKD (chronic kidney disease)   ? Coronary artery disease   ? Diabetes mellitus   ? Diabetic retinopathy ([place])   ? NPDR OU  ? Hyperlipidemia   ? Hypertension   ? Hypertensive retinopathy   ? OU  ? Left thyroid nodule   ? diagnosed 07/2018  ? PAC (premature atrial contraction) 02/15/2021  ? Pseudotumor cerebri   ? Stroke [facility])   ? Vitamin D deficiency   ?  ?Past Surgical  History:  ?Procedure Laterality Date  ? ACHILLES TENDON REPAIR    ? ACHILLES TENDON SURGERY Left 02/19/2021  ? Procedure: ACHILLES TENDON REPAIR WITH GRAFT;  Surgeon: Felipa Furnace, DPM;  Location: [place];  Service: Podiatry;  Laterality: Left;  BLOCK  ? AMPUTATION TOE Right 05/28/2021  ? Procedure: AMPUTATION TOE;  Surgeon: Lorenda Peck, MD;  Location: [place];  Service: Podiatry;  Laterality: Right;  Surgical team will do block  ? GASTROC RECESSION EXTREMITY Left 02/19/2021  ? Procedure: GASTROC RECESSION EXTREMITY;  Surgeon: Felipa Furnace, DPM;  Location: [place];  Service: Podiatry;  Laterality: Left;  Block  ? TUBAL LIGATION    ?  ? ?Current Outpatient Medications:  ?  Accu-Chek FastClix Lancets MISC, 4 (four) times daily., Disp: , Rfl:  ?  ACCU-CHEK GUIDE test strip, 1 each by Other route See admin instructions. for testing, Disp: 100 each, Rfl: 2 ?  albuterol (VENTOLIN HFA) 108 (90 Base) MCG/ACT inhaler, Inhale into the lungs every 6 (six) hours as needed for wheezing or shortness of breath., Disp: , Rfl:  ?  amLODipine-benazepril (LOTREL) 10-40 MG capsule, Take 1  capsule by mouth daily., Disp: , Rfl:  ?  aspirin 81 MG EC tablet, Take 1 tablet (81 mg total) by mouth daily., Disp: 90 tablet, Rfl: 2 ?  blood glucose meter kit and supplies KIT, Dispense based on patient and insurance preference. Use up to four times daily as directed. (FOR ICD-9 250.00, 250.01)., Disp: 1 each, Rfl: 0 ?  Cholecalciferol (VITAMIN D3) 25 MCG (1000 UT) CAPS, Take 1 capsule by mouth daily., Disp: , Rfl:  ?  cloNIDine (CATAPRES - DOSED IN MG/24 HR) 0.3 mg/24hr patch, Place 1 patch (0.3 mg total) onto the skin once a week., Disp: 4 patch, Rfl: 12 ?  cyclobenzaprine (FLEXERIL) 5 MG tablet, Take 5 mg by mouth 3 (three) times daily as needed., Disp: , Rfl:  ?  fluticasone (FLONASE) 50 MCG/ACT nasal spray, Place 2 sprays into both nostrils daily as needed for allergies or rhinitis., Disp: , Rfl:  ?   glipiZIDE (GLUCOTROL) 5 MG tablet, Take 0.5 tablets (2.5 mg total) by mouth daily before breakfast., Disp: 15 tablet, Rfl: 0 ?  hydrOXYzine (VISTARIL) 25 MG capsule, Take 25 mg by mouth at bedtime as needed., Disp: , Rfl:  ?  insulin aspart protamine- aspart (NOVOLOG MIX 70/30) (70-30) 100 UNIT/ML injection, Inject 0.15 mLs (15 Units total) into the skin 2 (two) times daily with a meal. (Patient taking differently: Inject 15-17 Units into the skin See admin instructions. Inject 15 units subcutaneous in the morning, then inject 17 units subcutaneous in the evening), Disp: 10 mL, Rfl: 11 ?  Insulin Syringes, Disposable, U-100 1 ML MISC, 1 application by Does not apply route 2 (two) times a day., Disp: 100 each, Rfl: 0 ?  ipratropium-albuterol (DUONEB) 0.5-2.5 (3) MG/3ML SOLN, Take 3 mLs by nebulization every 4 (four) hours as needed. (Patient taking differently: Take 3 mLs by nebulization every 4 (four) hours as needed (sob/wheezing).), Disp: 360 mL, Rfl: 0 ?  isosorbide-hydrALAZINE (BIDIL) 20-37.5 MG tablet, Take 2 tablets by mouth 3 (three) times daily., Disp: 540 tablet, Rfl: 3 ?  labetalol (NORMODYNE) 300 MG tablet, Take 300 mg by mouth 2 (two) times daily., Disp: , Rfl:  ?  metFORMIN (GLUCOPHAGE) 1000 MG tablet, Take 1,000 mg by mouth 2 (two) times daily., Disp: , Rfl:  ?  methocarbamol (ROBAXIN) 750 MG tablet, Take 1 tablet (750 mg total) by mouth every 8 (eight) hours as needed (muscle spasm/pain)., Disp: 15 tablet, Rfl: 0 ?  rosuvastatin (CRESTOR) 40 MG tablet, Take 40 mg by mouth daily., Disp: , Rfl:  ?  busPIRone (BUSPAR) 5 MG tablet, Take 5 mg by mouth 2 (two) times daily. (Patient not taking: Reported on 11/18/2021), Disp: , Rfl:  ?  furosemide (LASIX) 40 MG tablet, Take 1 tablet (40 mg total) by mouth daily., Disp: 120 tablet, Rfl: 3 ?Allergies  ?Allergen Reactions  ? Bee Pollen Other (See Comments)  ?  Seasonal Allergies  ? Dust Mite Extract Other (See Comments)  ?  Sneezing & Cough  ? Mixed Ragweed  Itching  ?  ?Social History  ? ?Tobacco Use  ? Smoking status: Former  ?  Packs/day: 0.50  ?  Years: 30.00  ?  Pack years: 15.00  ?  Types: Cigarettes  ?  Quit date: 2017  ?  Years since quitting: 6.2  ? Smokeless tobacco: Never  ?Substance Use Topics  ? Alcohol use: Not Currently  ?  Alcohol/week: 0.0 standard drinks  ?  Comment: rare  ?  ?Family History  ?Problem Relation  Age of Onset  ? Asthma Mother   ? Diabetes Father   ? Hypertension Father   ? Kidney disease Father   ? Hypertension Sister   ? Asthma Sister   ? Congestive Heart Failure Maternal Aunt   ? Diabetes Maternal Grandmother   ? Congestive Heart Failure Maternal Grandmother   ? Lung disease Maternal Grandmother   ? Asthma Other   ? Hyperlipidemia Other   ? Hypertension Other   ? Cancer Other   ?  ? ? ?Review of Systems ? ?Constitutional: negative for fatigue and weight loss ?Respiratory: negative for cough and wheezing ?Cardiovascular: negative for chest pain, fatigue and palpitations ?Gastrointestinal: negative for abdominal pain and change in bowel habits ?Musculoskeletal:negative for myalgias ?Neurological: negative for gait problems and tremors ?Behavioral/Psych: negative for abusive relationship, depression ?Endocrine: negative for temperature intolerance    ?Genitourinary: positive for vaginal discharge.  negative for abnormal menstrual periods, genital lesions, hot flashes, sexual problems  ?Integument/breast: negative for breast lump, breast tenderness, nipple discharge and skin lesion(s) ? ?  ?Objective:  ? ?    ?BP (!) 186/101   Pulse 81   Ht 5' 9"  (1.753 m)   Wt 229 lb (103.9 kg)   BMI 33.82 kg/m?  ?General:   Alert and no distress  ?Skin:   no rash or abnormalities  ?Lungs:   clear to auscultation bilaterally  ?Heart:   regular rate and rhythm, S1, S2 normal, no murmur, click, rub or gallop  ?Breasts:   normal without suspicious masses, skin or nipple changes or axillary nodes  ?Abdomen:  normal findings: no organomegaly, soft,  non-tender and no hernia  ?Pelvis:  External genitalia: normal general appearance ?Urinary system: urethral meatus normal and bladder without fullness, nontender ?Vaginal: normal without tenderness, induration or masses ?

## 2021-11-19 LAB — CERVICOVAGINAL ANCILLARY ONLY
Bacterial Vaginitis (gardnerella): POSITIVE — AB
Candida Glabrata: NEGATIVE
Candida Vaginitis: NEGATIVE
Chlamydia: NEGATIVE
Comment: NEGATIVE
Comment: NEGATIVE
Comment: NEGATIVE
Comment: NEGATIVE
Comment: NEGATIVE
Comment: NORMAL
Neisseria Gonorrhea: NEGATIVE
Trichomonas: NEGATIVE

## 2021-11-19 LAB — RPR: RPR Ser Ql: NONREACTIVE

## 2021-11-19 LAB — HEPATITIS C ANTIBODY: Hep C Virus Ab: NONREACTIVE

## 2021-11-19 LAB — HEPATITIS B SURFACE ANTIGEN: Hepatitis B Surface Ag: NEGATIVE

## 2021-11-19 LAB — HIV ANTIBODY (ROUTINE TESTING W REFLEX): HIV Screen 4th Generation wRfx: NONREACTIVE

## 2021-11-19 NOTE — BH Specialist Note (Signed)
Integrated Behavioral Health Initial In-Person Visit ? ?MRN: [id number] ?Name: [name] ? ?Number of [place] Clinician visits: 1- Initial Visit ? ?Session Start time: 1400 ?   ?Session End time: 2633 ? ?Total time in minutes: 23 ?In person at [facility]  ? ?Types of Service: Individual psychotherapy ? ?Interpretor:No. Interpretor Name and Language: none  ? ? Warm Hand Off Completed. ? ?  ? ?  ? ? ?Subjective: ?[name] is a 51 y.o. female accompanied by  oldest daughter ?Patient was referred by Dr. Jodi Mourning  for depressed mood . ?Patient reports the following symptoms/concerns: depressed mood, unstable housing, overwhelmed, feeling of edge  ?Duration of problem: approx 6 months ; Severity of problem: mild ? ?Objective: ?Mood: Depressed and Affect: Appropriate ?Risk of harm to self or others: No plan to harm self or others ? ?Life Context: ?Family and Social: Currently staying with brother until she can find housing/Pt reports no longer has  section 8 voucher  ?School/Work: disabled  ?Self-Care: n/a ?Life Changes: unstable housing  ? ?Patient and/or Family's Strengths/Protective Factors: ?Sense of purpose ? ?Goals Addressed: ?Patient will: ?Reduce symptoms of: depression ?Increase knowledge and/or ability of: coping skills  ?Demonstrate ability to: Increase healthy adjustment to current life circumstances ? ?Progress towards Goals: ?Achieved ? ?Interventions: ?Interventions utilized: Supportive Counseling and Link to [employer]  ?Standardized Assessments completed: PHQ 9 ? ?Assessment: ?Patient currently experiencing depression and unstable housing . ?  ?Patient may benefit from community mental and housing services . ? ?Plan: ?Follow up with behavioral health clinician on : prn  ?Behavioral recommendations: Contact apartment resources given, contact [facility]. ?Referral(s): [employer] Resources:  Housing and [place] urgent  care   ?"From scale of 1-10, how likely are you to follow plan?":   ? ?Teresa Ferrier, LCSW ? ? ? ? ? ? ? ? ?

## 2021-11-19 NOTE — Progress Notes (Signed)
Called pt no answer left a vm

## 2021-11-20 ENCOUNTER — Other Ambulatory Visit: Payer: Self-pay | Admitting: Obstetrics

## 2021-11-20 DIAGNOSIS — B9689 Other specified bacterial agents as the cause of diseases classified elsewhere: Secondary | ICD-10-CM

## 2021-11-20 MED ORDER — METRONIDAZOLE 500 MG PO TABS: 500.0000 mg | ORAL_TABLET | Freq: Two times a day (BID) | ORAL | 2 refills | Status: DC

## 2021-11-21 IMAGING — MR MR FOOT*R* WO/W CM
9 of 11 series · 36 of 40 positions shown · IV contrast (GADAVIST)
Comparison: Plain films right foot 10/09/2020.

CLINICAL DATA: Open wound on the great toe of the right foot since
July 2020. Question osteomyelitis.

EXAM:
MRI OF THE RIGHT FOREFOOT WITHOUT AND WITH CONTRAST
TECHNIQUE: Multiplanar, multisequence MR imaging of the right forefoot was
performed before and after the administration of intravenous
contrast.
CONTRAST:  10 mL GADAVIST IV SOLN

[Series 3: T1 · coronal · right · 3.0mm · 0.47mm/px · 5 of 42 slices shown (1 of 2)]
[im 1/42]
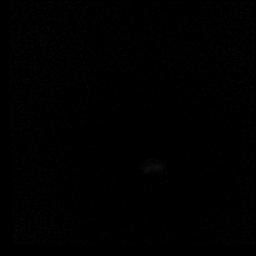
[im 11/42]
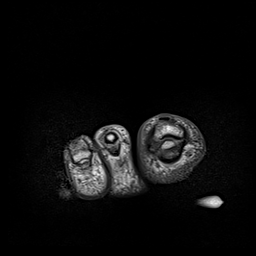
[im 21/42]
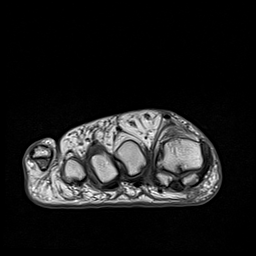
[im 31/42]
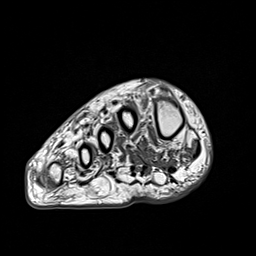
[im 42/42]
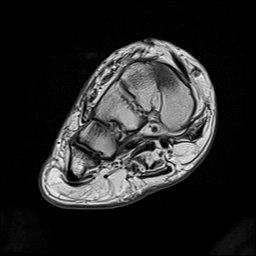

[Series 4: T2 fat-sat · coronal · right · 3.0mm · 0.38mm/px · 5 of 42 slices shown (1 of 2)]
[im 1/42]
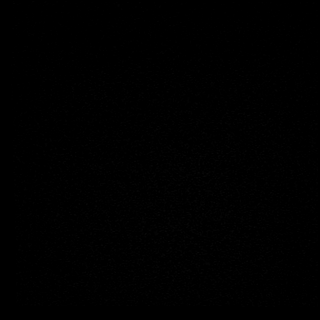
[im 11/42]
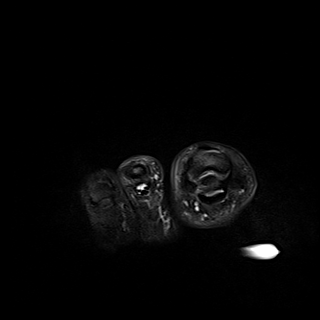
[im 21/42]
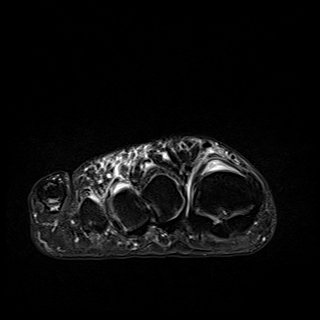
[im 31/42]
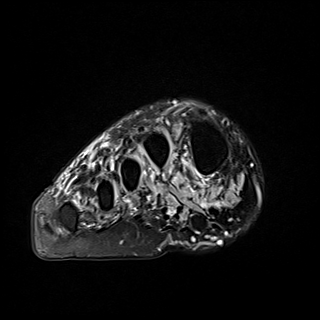
[im 42/42]
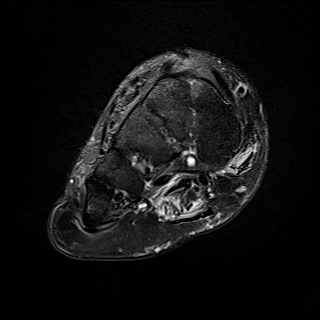

[Series 5: T2 fat-sat · axial · right · 3.0mm · 0.70mm/px · z∈[-129,-39]mm · 3 of 28 slices shown (2 of 2)]
[im 1/28]
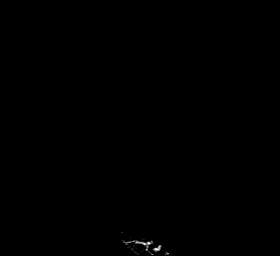
[im 14/28]
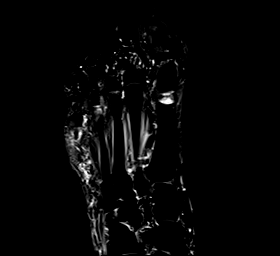
[im 28/28]
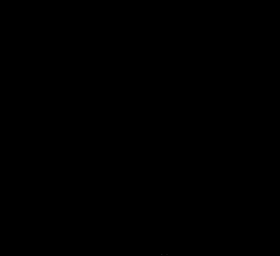

[Series 6: T1 · axial · right · 3.0mm · 0.70mm/px · z∈[-131,-41]mm · 3 of 28 slices shown (2 of 2)]
[im 1/28]
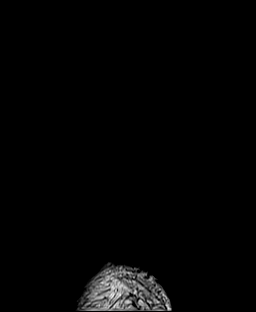
[im 14/28]
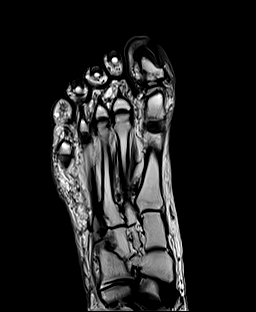
[im 28/28]
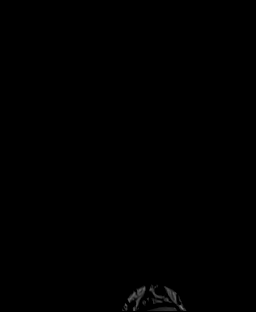

[Series 7: STIR · sagittal · right · 3.0mm · 0.35mm/px · 4 of 30 slices shown]
[im 1/30]
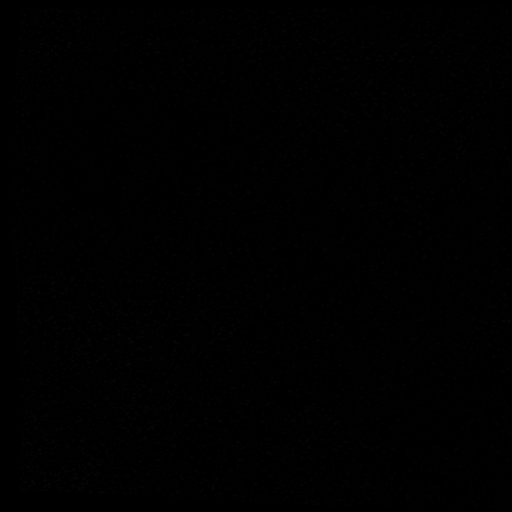
[im 10/30]
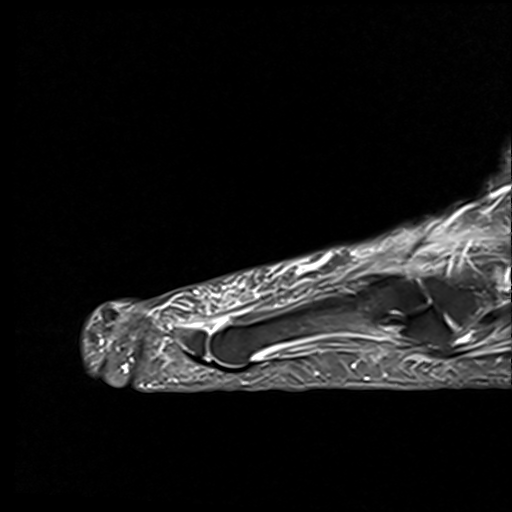
[im 20/30]
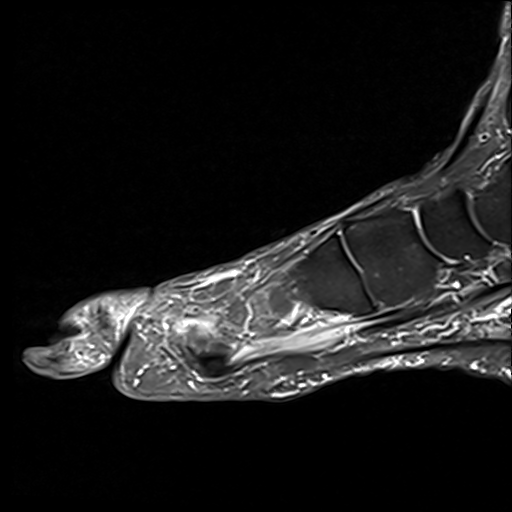
[im 30/30]
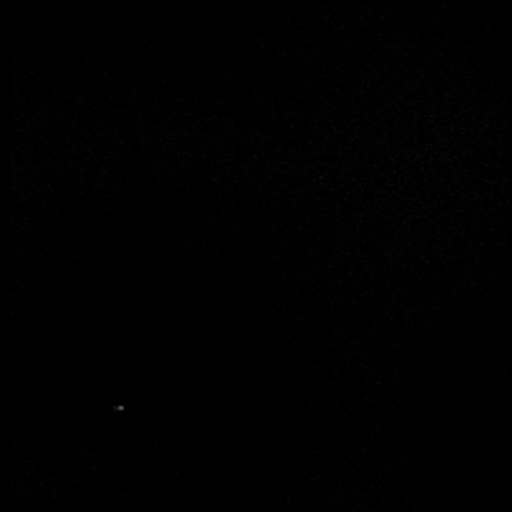

[Series 8: T1 fat-sat · coronal · non-contrast · right · 3.0mm · 0.47mm/px · 5 of 41 slices shown]
[im 1/41]
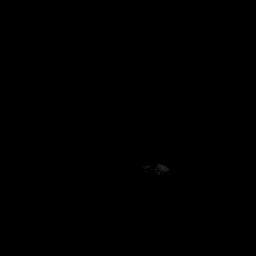
[im 11/41]
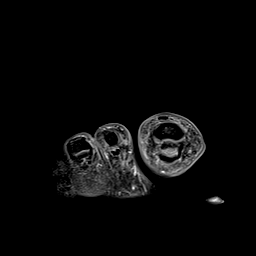
[im 21/41]
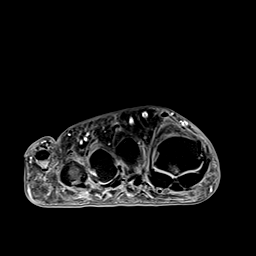
[im 31/41]
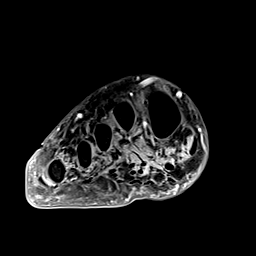
[im 41/41]
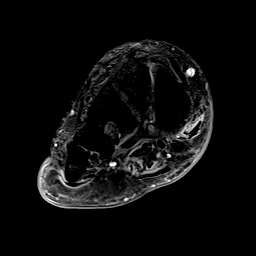

[Series 9: T1 fat-sat post-contrast · coronal · right · 3.0mm · 0.47mm/px · 5 of 41 slices shown (1 of 3)]
[im 1/41]
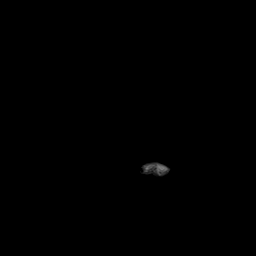
[im 11/41]
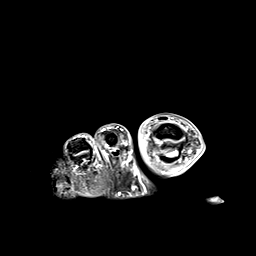
[im 21/41]
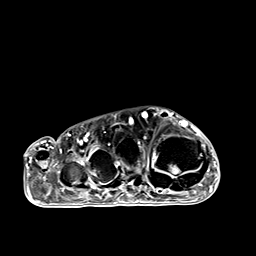
[im 31/41]
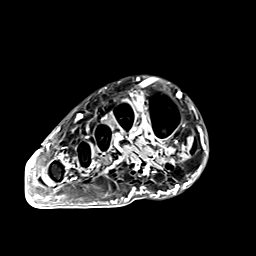
[im 41/41]
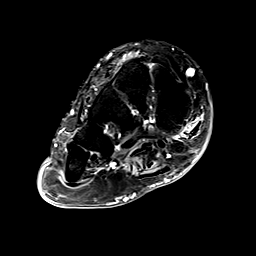

[Series 10: T1 fat-sat post-contrast · sagittal · right · 3.0mm · 0.35mm/px · 3 of 27 slices shown (2 of 3)]
[im 1/27]
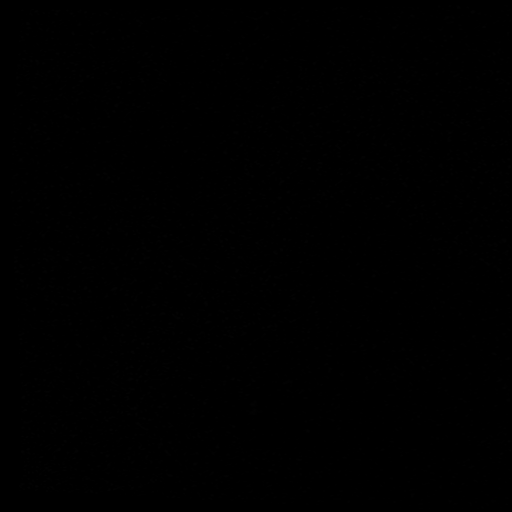
[im 14/27]
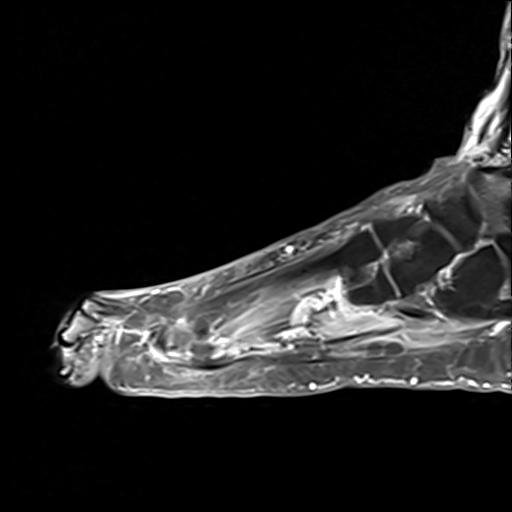
[im 27/27]
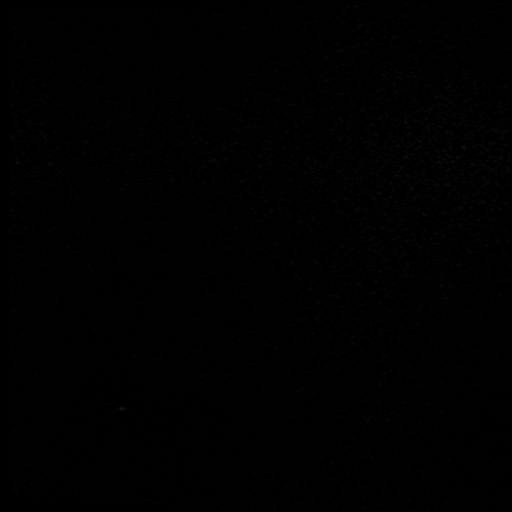

[Series 11: T1 fat-sat post-contrast · axial · right · 3.0mm · 0.56mm/px · z∈[-126,-37]mm · 3 of 28 slices shown (3 of 3)]
[im 1/28]
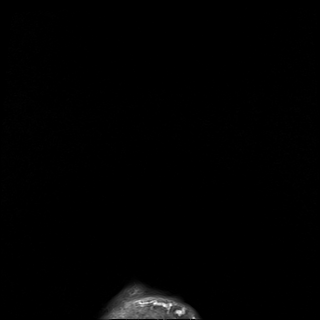
[im 14/28]
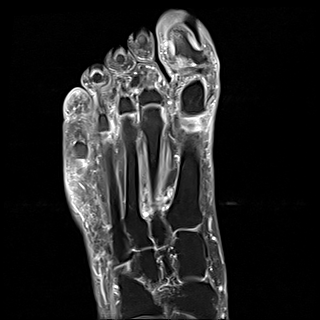
[im 28/28]
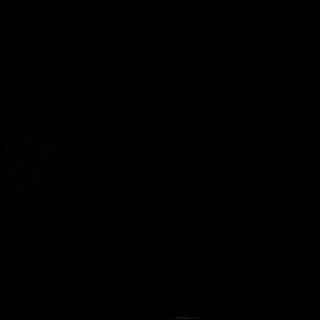

[36 of 40 positions shown; findings below may reference images not displayed]

FINDINGS: Bones/Joint/Cartilage

There is no marrow edema or enhancement to suggest osteomyelitis.
Small subchondral cyst in the plantar surface of the head of the
first metatarsal is incidentally noted. No joint effusion. No
fracture, stress change or worrisome lesion.

Ligaments

Intact.

Muscles and Tendons

Intact. Negative for intramuscular fluid collection. Mild fatty
atrophy of intrinsic musculature the foot noted.

Soft tissues

No abscess. Skin wound on the plantar surface of the great toe
noted.
IMPRESSION: Skin ulceration on the great toe without abscess, osteomyelitis or
septic joint.

Mild first MTP osteoarthritis.

## 2021-11-22 LAB — CYTOLOGY - PAP
Comment: NEGATIVE
Diagnosis: NEGATIVE
High risk HPV: NEGATIVE

## 2021-11-26 ENCOUNTER — Other Ambulatory Visit: Payer: Self-pay | Admitting: Student

## 2021-11-26 MED ORDER — EZETIMIBE 10 MG PO TABS: 10.0000 mg | ORAL_TABLET | Freq: Every day | ORAL | 11 refills | Status: DC

## 2021-11-26 NOTE — Progress Notes (Signed)
Script has been sent.

## 2021-11-26 NOTE — Progress Notes (Signed)
Called and spoke to pt, pt voiced understanding and agreed to start zetia.

## 2021-12-08 ENCOUNTER — Other Ambulatory Visit (HOSPITAL_COMMUNITY): Payer: Self-pay

## 2021-12-08 ENCOUNTER — Other Ambulatory Visit: Payer: Self-pay

## 2021-12-08 ENCOUNTER — Emergency Department (HOSPITAL_COMMUNITY)
Admission: EM | Admit: 2021-12-08 | Discharge: 2021-12-08 | Disposition: A | Discharge status: Home / Self Care | Payer: Medicaid Other | Attending: Emergency Medicine | Admitting: Emergency Medicine

## 2021-12-08 ENCOUNTER — Encounter (HOSPITAL_COMMUNITY): Payer: Self-pay | Admitting: Emergency Medicine

## 2021-12-08 DIAGNOSIS — Z79899 Other long term (current) drug therapy: Secondary | ICD-10-CM | POA: Diagnosis not present

## 2021-12-08 DIAGNOSIS — N9489 Other specified conditions associated with female genital organs and menstrual cycle: Secondary | ICD-10-CM | POA: Insufficient documentation

## 2021-12-08 DIAGNOSIS — T7840XA Allergy, unspecified, initial encounter: Secondary | ICD-10-CM | POA: Insufficient documentation

## 2021-12-08 DIAGNOSIS — Z7984 Long term (current) use of oral hypoglycemic drugs: Secondary | ICD-10-CM | POA: Diagnosis not present

## 2021-12-08 MED ORDER — EPINEPHRINE 0.3 MG/0.3ML IJ SOAJ
0.3000 mg | INTRAMUSCULAR | 0 refills | Status: DC | PRN
  Filled 2021-12-08: qty 2, 7d supply, fill #0

## 2021-12-08 MED ORDER — PREDNISONE 20 MG PO TABS
60.0000 mg | ORAL_TABLET | Freq: Once | ORAL | Status: AC
  Administered 2021-12-08: 60 mg via ORAL
  Filled 2021-12-08: qty 3

## 2021-12-08 MED ORDER — FAMOTIDINE 20 MG PO TABS
20.0000 mg | ORAL_TABLET | Freq: Once | ORAL | Status: AC
  Administered 2021-12-08: 20 mg via ORAL
  Filled 2021-12-08: qty 1

## 2021-12-08 MED ORDER — PREDNISONE 20 MG PO TABS
20.0000 mg | ORAL_TABLET | Freq: Every day | ORAL | 0 refills | Status: AC
  Filled 2021-12-08: qty 5, 5d supply, fill #0

## 2021-12-08 MED ORDER — DIPHENHYDRAMINE HCL 25 MG PO CAPS
25.0000 mg | ORAL_CAPSULE | Freq: Once | ORAL | Status: AC
  Administered 2021-12-08: 25 mg via ORAL
  Filled 2021-12-08: qty 1

## 2021-12-08 NOTE — ED Triage Notes (Signed)
?  Pt reports sitting outside, bug landed on her face. Pt reports Monday began having small bump to swelling across face. Pt reports numbness and pressure across face. No difficulty breathing. VSS. Tried benedryl at home with little relief. ?

## 2021-12-08 NOTE — ED Provider Triage Note (Signed)
Emergency Medicine Provider Triage Evaluation Note ? ?[name] , a 51 y.o. female  was evaluated in triage.  Pt complains of allergic reaction.  On Saturday patient was bit by an insect on her face and took some Benadryl however this morning she woke up and her lips were swelling and she had facial swelling that concerned her so she came here.  No known allergies.  Is unsure what bit her. ? ?Review of Systems  ?Positive: Facial, lip swelling.  Tension in throat and tender lymphadenopathy ?Negative: Nausea, vomiting, diarrhea, difficulty breathing ? ?Physical Exam  ?BP (!) 210/114 (BP Location: Right Arm)   Pulse 100   Temp 98.4 ?F (36.9 ?C) (Oral)   Resp 18   SpO2 100%  ?Gen:   Awake, no distress   ?Resp:  Normal effort  ?MSK:   Moves extremities without difficulty  ?Other:  Swelling to the upper lips.  Lymphadenopathy with tenderness to the cervical lymph nodes bilaterally.  Airway clear, tolerating secretions ? ?Medical Decision Making  ?Medically screening exam initiated at 11:51 AM.  Appropriate orders placed.  [name] was informed that the remainder of the evaluation will be completed by another provider, this initial triage assessment does not replace that evaluation, and the importance of remaining in the ED until their evaluation is complete. ? ?Nursing notified the patient will need a room sooner due to a potentially developing anaphylaxis.  Height tensive in triage ?  ?Rhae Hammock, PA-C ?12/08/21 1158 ? ?

## 2021-12-08 NOTE — ED Provider Notes (Signed)
?[facility] ?Provider Note ? ? ?CSN: [id number] ?Arrival date & time: 12/08/21  1146 ? ?  ? ?History ? ?Chief Complaint  ?Patient presents with  ? Allergic Reaction  ? ? ?[name] is a 51 y.o. female with no known allergies presenting with an allergic reaction.  She reports on Saturday she felt a bug on her right cheek and slapped it.  She immediately had burning and facial discomfort.  Says she took 2 Benadryl however this morning she woke up and her face was swollen and her lips were beginning to swell.  Said that she had some discomfort in her throat but denies shortness of breath or wheezing.  Says she has not had any nausea, vomiting or diarrhea since Saturday.  This is never happened before and reports only being allergic to dust and pollen. ? ?Allergic Reaction ?Presenting symptoms: no wheezing   ? ?  ? ?Home Medications ?Prior to Admission medications   ?Medication Sig Start Date End Date Taking? Authorizing Provider  ?EPINEPHrine 0.3 mg/0.3 mL IJ SOAJ injection Inject 0.3 mg into the muscle as needed for anaphylaxis. 12/08/21  Yes Elinor Kleine A, PA-C  ?predniSONE (DELTASONE) 20 MG tablet Take 1 tablet (20 mg total) by mouth daily for 5 days. 12/08/21 12/13/21 Yes Berklee Battey A, PA-C  ?Accu-Chek FastClix Lancets MISC 4 (four) times daily. 05/28/19   [provider]  ?ACCU-CHEK GUIDE test strip 1 each by Other route See admin instructions. for testing 08/28/20   Woodroe Mode, MD  ?albuterol (VENTOLIN HFA) 108 (90 Base) MCG/ACT inhaler Inhale into the lungs every 6 (six) hours as needed for wheezing or shortness of breath.    [provider]  ?amLODipine-benazepril (LOTREL) 10-40 MG capsule Take 1 capsule by mouth daily.    [provider]  ?aspirin 81 MG EC tablet Take 1 tablet (81 mg total) by mouth daily. 10/14/20   Patwardhan, Reynold Bowen, MD  ?blood glucose meter kit and supplies KIT Dispense based on patient and insurance preference.  Use up to four times daily as directed. (FOR ICD-9 250.00, 250.01). 04/12/18   Martyn Ehrich, NP  ?busPIRone (BUSPAR) 5 MG tablet Take 5 mg by mouth 2 (two) times daily. ?Patient not taking: Reported on 11/18/2021 10/14/21   [provider]  ?Cholecalciferol (VITAMIN D3) 25 MCG (1000 UT) CAPS Take 1 capsule by mouth daily. 05/28/19   [provider]  ?cloNIDine (CATAPRES - DOSED IN MG/24 HR) 0.3 mg/24hr patch Place 1 patch (0.3 mg total) onto the skin once a week. 09/08/21   Cantwell, Celeste C, PA-C  ?cyclobenzaprine (FLEXERIL) 5 MG tablet Take 5 mg by mouth 3 (three) times daily as needed. 10/04/21   [provider]  ?ezetimibe (ZETIA) 10 MG tablet Take 1 tablet (10 mg total) by mouth daily. 11/26/21 11/26/22  Cantwell, Celeste C, PA-C  ?fluticasone (FLONASE) 50 MCG/ACT nasal spray Place 2 sprays into both nostrils daily as needed for allergies or rhinitis.    [provider]  ?furosemide (LASIX) 40 MG tablet Take 1 tablet (40 mg total) by mouth daily. 07/22/21 10/20/21  Patwardhan, Reynold Bowen, MD  ?glipiZIDE (GLUCOTROL) 5 MG tablet Take 0.5 tablets (2.5 mg total) by mouth daily before breakfast. 05/30/21   Alcus Dad, MD  ?hydrOXYzine (VISTARIL) 25 MG capsule Take 25 mg by mouth at bedtime as needed. 10/04/21   [provider]  ?insulin aspart protamine- aspart (NOVOLOG MIX 70/30) (70-30) 100 UNIT/ML injection Inject 0.15  mLs (15 Units total) into the skin 2 (two) times daily with a meal. ?Patient taking differently: Inject 15-17 Units into the skin See admin instructions. Inject 15 units subcutaneous in the morning, then inject 17 units subcutaneous in the evening 02/20/19   Love, Ivan Anchors, PA-C  ?Insulin Syringes, Disposable, Z-224 1 ML MISC 1 application by Does not apply route 2 (two) times a day. 02/20/19   Love, Ivan Anchors, PA-C  ?ipratropium-albuterol (DUONEB) 0.5-2.5 (3) MG/3ML SOLN Take 3 mLs by nebulization every 4 (four) hours as needed. ?Patient taking differently:  Take 3 mLs by nebulization every 4 (four) hours as needed (sob/wheezing). 06/25/20   Little Ishikawa, MD  ?isosorbide-hydrALAZINE (BIDIL) 20-37.5 MG tablet Take 2 tablets by mouth 3 (three) times daily. 10/14/20   Patwardhan, Reynold Bowen, MD  ?labetalol (NORMODYNE) 300 MG tablet Take 300 mg by mouth 2 (two) times daily. 08/16/21   [provider]  ?metFORMIN (GLUCOPHAGE) 1000 MG tablet Take 1,000 mg by mouth 2 (two) times daily. 07/10/21   [provider]  ?methocarbamol (ROBAXIN) 750 MG tablet Take 1 tablet (750 mg total) by mouth every 8 (eight) hours as needed (muscle spasm/pain). 09/30/21   Lajean Saver, MD  ?metroNIDAZOLE (FLAGYL) 500 MG tablet Take 1 tablet (500 mg total) by mouth 2 (two) times daily. 11/20/21   Shelly Bombard, MD  ?rosuvastatin (CRESTOR) 40 MG tablet Take 40 mg by mouth daily.    [provider]  ?   ? ?Allergies    ?Bee pollen, Dust mite extract, Ed-apap [acetaminophen], and Mixed ragweed   ? ?Review of Systems   ?Review of Systems  ?HENT:  Positive for facial swelling and sore throat.   ?Respiratory:  Negative for wheezing.   ?Gastrointestinal:  Negative for abdominal pain, diarrhea, nausea and vomiting.  ?Neurological:  Negative for dizziness.  ? ?Physical Exam ?Updated Vital Signs ?BP (!) 188/100 (BP Location: Right Arm)   Pulse 87   Temp 98.7 ?F (37.1 ?C) (Oral)   Resp 18   Ht 5' 9" (1.753 m)   Wt 99.8 kg   SpO2 100%   BMI 32.49 kg/m?  ?Physical Exam ?Vitals and nursing note reviewed.  ?Constitutional:   ?   General: She is not in acute distress. ?   Appearance: Normal appearance.  ?HENT:  ?   Head: Normocephalic and atraumatic.  ?   Comments: Erythema to the bilateral cheeks with some associated soft tissue swelling.   ?   Mouth/Throat:  ?   Comments: Airway clear, tolerating secretions.  Some inflammation to the upper lip ?Eyes:  ?   General: No scleral icterus. ?   Conjunctiva/sclera: Conjunctivae normal.  ?Neck:  ?   Comments: Cervical lymphadenopathy  bilaterally, right greater than left, associated tenderness ?Pulmonary:  ?   Effort: Pulmonary effort is normal. No respiratory distress.  ?   Breath sounds: No wheezing.  ?Musculoskeletal:  ?   Cervical back: Tenderness present.  ?Lymphadenopathy:  ?   Cervical: Cervical adenopathy present.  ?Skin: ?   General: Skin is warm and dry.  ?   Findings: No rash.  ?Neurological:  ?   Mental Status: She is alert.  ?Psychiatric:     ?   Mood and Affect: Mood normal.  ? ? ?ED Results / Procedures / Treatments   ?Labs ?(all labs ordered are listed, but only abnormal results are displayed) ?Labs Reviewed - No data to display ? ?EKG ?None ? ?Radiology ?No results found. ? ?Procedures ?Procedures  ? ? ?  Medications Ordered in ED ?Medications  ?famotidine (PEPCID) tablet 20 mg (20 mg Oral Given 12/08/21 1203)  ?predniSONE (DELTASONE) tablet 60 mg (60 mg Oral Given 12/08/21 1203)  ?diphenhydrAMINE (BENADRYL) capsule 25 mg (25 mg Oral Given 12/08/21 1203)  ? ? ?ED Course/ Medical Decision Making/ A&P ?  ?                        ?Medical Decision Making ?Risk ?Prescription drug management. ? ? ?50-year-old female presenting with an allergic reaction after a bug bite on Saturday.  Reports no known allergies.  She took Benadryl which helped her on Saturday but this morning she had facial swelling. ? ?When she presented, I saw her in triage.  She was in no acute distress however did have facial swelling as well as inflammation to the upper lip.  I again saw the patient when she got to the back of the department after 3 hours of waiting in the waiting room.  She had been given Benadryl, Pepcid and prednisone on arrival.  Her symptoms have not progressed but she reports feeling the same.,  She will be discharged home with a burst of prednisone.  She was notified that this can cause hyperglycemia.  She has also been prescribed an EpiPen and educated on anaphylaxis reactions. ? ?I spoke about this patient with my attending, Dr. Haviland, she is  agreeable to the plan to discharge the patient home after 3 hours without decompensation in the department ? ?Final Clinical Impression(s) / ED Diagnoses ?Final diagnoses:  ?Allergic reaction, initial enco

## 2021-12-08 NOTE — ED Notes (Signed)
Pt verbalized understanding of d/c instructions, meds, and followup care. Denies questions. VSS, no distress noted. Steady gait to exit with all belongings.  ?

## 2021-12-08 NOTE — Discharge Instructions (Addendum)
Prednisone is a steroid.  This may elevate your blood sugar.  Take your first dose tomorrow, you received some in the department today. ? ?I attached information about how to use an EpiPen as well as information about anaphylaxis reactions to these discharge papers for you to inform herself.  Return with any worsening symptoms. ?

## 2021-12-14 ENCOUNTER — Ambulatory Visit: Payer: Medicaid Other | Admitting: Student

## 2021-12-14 ENCOUNTER — Encounter: Payer: Self-pay | Admitting: Student

## 2021-12-14 VITALS — BP 190/107 | HR 76 | Temp 98.0°F | Resp 17 | Ht 69.0 in | Wt 226.8 lb

## 2021-12-14 DIAGNOSIS — N1832 Chronic kidney disease, stage 3b: Secondary | ICD-10-CM

## 2021-12-14 DIAGNOSIS — I10 Essential (primary) hypertension: Secondary | ICD-10-CM

## 2021-12-14 NOTE — Progress Notes (Signed)
? ? ?Patient referred by [name], MD for hypertensive heart disease ? ?Subjective:  ? ?[name], female    DOB: 04/09/1971, 51 y.o.   MRN: [id number] ? ? ?Chief Complaint  ?Patient presents with  ? med managment  ? ? ?51 y.o. African-American female with hypertension, uncontrolled type 2 diabetes mellitus, hyperlipidemia, tobacco abuse, recurrent strokes. ? ?Patient presents for urgent visit at her request with concerns of "medication management" follow-up of hypertension.  [name] blood pressure has been difficult to control given issues with medication as well as dietary compliance.  At last office visit started her on clonidine patch.  [name] blood pressure remains uncontrolled.  She admits to continued medication and dietary noncompliance.  Her primary question today is that the pharmacy tried to refill both carvedilol and labetalol, therefore she was unsure of which medication she should be taking.  I have advised patient that she is to be taking labetalol not carvedilol.  Patient also never started Zetia as previously prescribed.  She continues to take Lasix daily. ? ?I personally reviewed external labs; lipids, A1c remain uncontrolled. ? ?She denies chest pain, palpitations, dizziness, syncope, near syncope.  Denies symptoms suggestive of CVA/TIA. ? ?I did personally review external records from patient's nephrologist Dr. Moshe Cipro at [facility].  She was seen by Dr. Moshe Cipro 08/05/2021 at which time blood pressure was relatively well controlled and Dr. Moshe Cipro did thorough medication reconciliation with patient.   ?  ?Current Outpatient Medications on File Prior to Visit  ?Medication Sig Dispense Refill  ? Accu-Chek FastClix Lancets MISC 4 (four) times daily.    ? ACCU-CHEK GUIDE test strip 1 each by Other route See admin instructions. for testing 100 each 2  ? albuterol (VENTOLIN HFA) 108 (90 Base) MCG/ACT inhaler Inhale into the lungs every 6 (six) hours  as needed for wheezing or shortness of breath.    ? amLODipine-benazepril (LOTREL) 10-40 MG capsule Take 1 capsule by mouth daily.    ? aspirin 81 MG EC tablet Take 1 tablet (81 mg total) by mouth daily. 90 tablet 2  ? blood glucose meter kit and supplies KIT Dispense based on patient and insurance preference. Use up to four times daily as directed. (FOR ICD-9 250.00, 250.01). 1 each 0  ? busPIRone (BUSPAR) 5 MG tablet Take 5 mg by mouth 2 (two) times daily.    ? Cholecalciferol (VITAMIN D3) 25 MCG (1000 UT) CAPS Take 1 capsule by mouth daily.    ? cloNIDine (CATAPRES - DOSED IN MG/24 HR) 0.3 mg/24hr patch Place 1 patch (0.3 mg total) onto the skin once a week. 4 patch 12  ? cyclobenzaprine (FLEXERIL) 5 MG tablet Take 5 mg by mouth 3 (three) times daily as needed.    ? EPINEPHrine 0.3 mg/0.3 mL IJ SOAJ injection Inject 0.3 mg into the muscle as needed for anaphylaxis. 2 each 0  ? fenofibrate (TRICOR) 48 MG tablet Take 48 mg by mouth daily.    ? fluticasone (FLONASE) 50 MCG/ACT nasal spray Place 2 sprays into both nostrils daily as needed for allergies or rhinitis.    ? furosemide (LASIX) 40 MG tablet Take 1 tablet (40 mg total) by mouth daily. 120 tablet 3  ? glipiZIDE (GLUCOTROL) 5 MG tablet Take 0.5 tablets (2.5 mg total) by mouth daily before breakfast. 15 tablet 0  ? hydrOXYzine (VISTARIL) 25 MG capsule Take 25 mg by mouth at bedtime as needed.    ? insulin aspart protamine- aspart (NOVOLOG MIX 70/30) (70-30)  100 UNIT/ML injection Inject 0.15 mLs (15 Units total) into the skin 2 (two) times daily with a meal. (Patient taking differently: Inject 15-17 Units into the skin See admin instructions. Inject 15 units subcutaneous in the morning, then inject 17 units subcutaneous in the evening) 10 mL 11  ? Insulin Syringes, Disposable, S-827 1 ML MISC 1 application by Does not apply route 2 (two) times a day. 100 each 0  ? ipratropium-albuterol (DUONEB) 0.5-2.5 (3) MG/3ML SOLN Take 3 mLs by nebulization every 4 (four)  hours as needed. (Patient taking differently: Take 3 mLs by nebulization every 4 (four) hours as needed (sob/wheezing).) 360 mL 0  ? isosorbide-hydrALAZINE (BIDIL) 20-37.5 MG tablet Take 2 tablets by mouth 3 (three) times daily. 540 tablet 3  ? labetalol (NORMODYNE) 300 MG tablet Take 300 mg by mouth 2 (two) times daily.    ? methocarbamol (ROBAXIN) 750 MG tablet Take 1 tablet (750 mg total) by mouth every 8 (eight) hours as needed (muscle spasm/pain). 15 tablet 0  ? rosuvastatin (CRESTOR) 40 MG tablet Take 40 mg by mouth daily.    ? ezetimibe (ZETIA) 10 MG tablet Take 1 tablet (10 mg total) by mouth daily. (Patient not taking: Reported on 12/14/2021) 30 tablet 11  ? metFORMIN (GLUCOPHAGE) 1000 MG tablet Take 1,000 mg by mouth 2 (two) times daily. (Patient not taking: Reported on 12/14/2021)    ? ?No current facility-administered medications on file prior to visit.  ? ? ?Cardiovascular studies: ?EKG 12/14/2021: ?Sinus rhythm at a rate of 69 bpm.  Left atrial enlargement.  Normal axis.  LVH with secondary repolarization abnormalities, cannot exclude ischemia ? ?Carotid artery duplex 07/28/2021: ?Right Carotid: Velocities in the right ICA are consistent with a 1-39% stenosis.  ?Left Carotid: Velocities in the left ICA are consistent with a 1-39% stenosis.  ?Vertebrals: Bilateral vertebral arteries demonstrate antegrade flow. ? ?PCV ECHOCARDIOGRAM COMPLETE 07/27/2021 ?Left ventricle cavity is normal in size. Severe concentric hypertrophy of the left ventricle with mild LVOT obstruction. Max gradient 10 mmHg.  Normal global wall motion. Normal LV systolic function with EF 55%. Indeterminate diastolic filling pattern. ?Structurally normal trileaflet aortic valve. No evidence of aortic stenosis. Moderate (Grade II) aortic regurgitation. ?Mild (Grade I) mitral regurgitation. ?Mild tricuspid regurgitation. Estimated pulmonary artery systolic pressure 27 mmHg. ?Mild pulmonic regurgitation. ?No significant change compared to  previous study on 10/16/2019. ? ?EKG 11/26/2020: ?Sinus rhythm 88 bpm ?Frequent PACs  ?Left atrial enlargement ?Old anteroseptal infarct ? ?  ?Echocardiogram 06/25/2020:  ? 1. Left ventricular ejection fraction, by estimation, is 60 to 65%. The  ?left ventricle has normal function. The left ventricle has no regional  ?wall motion abnormalities. There is severe left ventricular hypertrophy.  ?Left ventricular diastolic parameters  ? were normal.  ? 2. Right ventricular systolic function is normal. The right ventricular  ?size is normal. There is normal pulmonary artery systolic pressure. The  ?estimated right ventricular systolic pressure is 07.8 mmHg.  ? 3. A small pericardial effusion is present.  ? 4. The mitral valve is normal in structure. No evidence of mitral valve  ?regurgitation.  ? 5. The aortic valve was not well visualized. Aortic valve regurgitation  ?is mild. No aortic stenosis is present.  ? 6. The inferior vena cava is dilated in size with >50% respiratory  ?variability, suggesting right atrial pressure of 8 mmHg.  ? ?Event monitor 09/17/2019 - 10/16/2019: ?Diagnostic time: 94%  ?Dominant rhythm: Sinus. ?HR 66-112 bpm. Avg HR 80 bpm. ?6 episodes of NSVT  up to 9 beats seen.  ?No atrial fibrillation/atrial flutter/SVT/high grade AV block, sinus pause >3sec noted. ?Symptoms reported: None ? ?MRI brain 08/20/2019: ?- Right frontal parasagittal and anterior corpus callosum lesion which is hyperintense on DWI, slightly hypointense on ADC map, hyperintense on T2 and FLAIR views, with heterogeneous enhancement.  Considerations could include subacute infarction, autoimmune, inflammatory or neoplastic etiologies. This is a new finding compared to 02/11/2019. ?- Mild scattered periventricular, subcortical, pontine chronic small vessel ischemic disease. ? ?CTA head/neck 02/12/2019: ?1. Diffuse distal branch vessel irregularity throughout the ?circle-of-Willis without a significant proximal stenosis or ?occlusion. ?2.  Atherosclerotic changes are evident within the cavernous internal ?carotid arteries and right greater than left M1 segments with ?diffuse vessel irregularity and mild narrowing of less than 50% ?relative to the mo

## 2021-12-16 ENCOUNTER — Ambulatory Visit: Payer: Medicaid Other

## 2021-12-28 ENCOUNTER — Ambulatory Visit
Admission: RE | Admit: 2021-12-28 | Discharge: 2021-12-28 | Disposition: A | Discharge status: Home / Self Care | Payer: Medicaid Other | Source: Ambulatory Visit | Attending: Obstetrics | Admitting: Obstetrics

## 2021-12-28 DIAGNOSIS — Z1239 Encounter for other screening for malignant neoplasm of breast: Secondary | ICD-10-CM

## 2022-01-07 ENCOUNTER — Other Ambulatory Visit: Payer: Self-pay | Admitting: Neurosurgery

## 2022-01-10 DIAGNOSIS — Z01810 Encounter for preprocedural cardiovascular examination: Secondary | ICD-10-CM | POA: Insufficient documentation

## 2022-01-10 NOTE — Progress Notes (Unsigned)
Patient referred by Jackie Plum, MD for hypertensive heart disease  Subjective:   [name], female    DOB: February 07, 1971, 51 y.o.   MRN: [id number]   No chief complaint on file.   51 y.o. African-American female with hypertension, uncontrolled type 2 diabetes mellitus, hyperlipidemia, tobacco abuse, recurrent strokes.  Patient is scheduled to undergo cervical spinal surgery next week. ***    Current Outpatient Medications:    Accu-Chek FastClix Lancets MISC, 4 (four) times daily., Disp: , Rfl:    ACCU-CHEK GUIDE test strip, 1 each by Other route See admin instructions. for testing, Disp: 100 each, Rfl: 2   albuterol (VENTOLIN HFA) 108 (90 Base) MCG/ACT inhaler, Inhale 1-2 puffs into the lungs every 6 (six) hours as needed for wheezing or shortness of breath., Disp: , Rfl:    amLODipine-benazepril (LOTREL) 10-40 MG capsule, Take 1 capsule by mouth daily., Disp: , Rfl:    aspirin 81 MG EC tablet, Take 1 tablet (81 mg total) by mouth daily., Disp: 90 tablet, Rfl: 2   blood glucose meter kit and supplies KIT, Dispense based on patient and insurance preference. Use up to four times daily as directed. (FOR ICD-9 250.00, 250.01)., Disp: 1 each, Rfl: 0   busPIRone (BUSPAR) 5 MG tablet, Take 5 mg by mouth 2 (two) times daily., Disp: , Rfl:    cloNIDine (CATAPRES - DOSED IN MG/24 HR) 0.3 mg/24hr patch, Place 1 patch (0.3 mg total) onto the skin once a week., Disp: 4 patch, Rfl: 12   cyclobenzaprine (FLEXERIL) 5 MG tablet, Take 5 mg by mouth 3 (three) times daily., Disp: , Rfl:    EPINEPHrine 0.3 mg/0.3 mL IJ SOAJ injection, Inject 0.3 mg into the muscle as needed for anaphylaxis., Disp: 2 each, Rfl: 0   ezetimibe (ZETIA) 10 MG tablet, Take 1 tablet (10 mg total) by mouth daily. (Patient not taking: Reported on 12/14/2021), Disp: 30 tablet, Rfl: 11   fenofibrate (TRICOR) 48 MG tablet, Take 48 mg by mouth daily., Disp: , Rfl:    fluticasone (FLONASE) 50 MCG/ACT nasal spray, Place 2  sprays into both nostrils daily as needed for allergies or rhinitis., Disp: , Rfl:    furosemide (LASIX) 40 MG tablet, Take 1 tablet (40 mg total) by mouth daily., Disp: 120 tablet, Rfl: 3   glipiZIDE (GLUCOTROL) 10 MG tablet, Take 10 mg by mouth daily before breakfast., Disp: , Rfl:    glipiZIDE (GLUCOTROL) 5 MG tablet, Take 0.5 tablets (2.5 mg total) by mouth daily before breakfast. (Patient not taking: Reported on 01/10/2022), Disp: 15 tablet, Rfl: 0   hydrOXYzine (VISTARIL) 25 MG capsule, Take 25 mg by mouth at bedtime., Disp: , Rfl:    insulin aspart protamine- aspart (NOVOLOG MIX 70/30) (70-30) 100 UNIT/ML injection, Inject 0.15 mLs (15 Units total) into the skin 2 (two) times daily with a meal. (Patient taking differently: Inject 10 Units into the skin 2 (two) times daily with a meal.), Disp: 10 mL, Rfl: 11   Insulin Syringes, Disposable, U-100 1 ML MISC, 1 application by Does not apply route 2 (two) times a day., Disp: 100 each, Rfl: 0   ipratropium-albuterol (DUONEB) 0.5-2.5 (3) MG/3ML SOLN, Take 3 mLs by nebulization every 4 (four) hours as needed. (Patient not taking: Reported on 01/10/2022), Disp: 360 mL, Rfl: 0   isosorbide-hydrALAZINE (BIDIL) 20-37.5 MG tablet, Take 2 tablets by mouth 3 (three) times daily., Disp: 540 tablet, Rfl: 3   labetalol (NORMODYNE) 300 MG tablet, Take 300 mg  by mouth 2 (two) times daily., Disp: , Rfl:    methocarbamol (ROBAXIN) 750 MG tablet, Take 1 tablet (750 mg total) by mouth every 8 (eight) hours as needed (muscle spasm/pain). (Patient not taking: Reported on 01/10/2022), Disp: 15 tablet, Rfl: 0   rosuvastatin (CRESTOR) 40 MG tablet, Take 40 mg by mouth daily., Disp: , Rfl:     Cardiovascular studies:  EKG 11/26/2020: Sinus rhythm 88 bpm Frequent PACs  Left atrial enlargement Old anteroseptal infarct    Echocardiogram 06/25/2020:   1. Left ventricular ejection fraction, by estimation, is 60 to 65%. The  left ventricle has normal function. The left  ventricle has no regional  wall motion abnormalities. There is severe left ventricular hypertrophy.  Left ventricular diastolic parameters   were normal.   2. Right ventricular systolic function is normal. The right ventricular  size is normal. There is normal pulmonary artery systolic pressure. The  estimated right ventricular systolic pressure is 63.8 mmHg.   3. A small pericardial effusion is present.   4. The mitral valve is normal in structure. No evidence of mitral valve  regurgitation.   5. The aortic valve was not well visualized. Aortic valve regurgitation  is mild. No aortic stenosis is present.   6. The inferior vena cava is dilated in size with >50% respiratory  variability, suggesting right atrial pressure of 8 mmHg.   Event monitor 09/17/2019 - 10/16/2019: Diagnostic time: 94%  Dominant rhythm: Sinus. HR 66-112 bpm. Avg HR 80 bpm. 6 episodes of NSVT up to 9 beats seen.  No atrial fibrillation/atrial flutter/SVT/high grade AV block, sinus pause >3sec noted. Symptoms reported: None  MRI brain 08/20/2019: - Right frontal parasagittal and anterior corpus callosum lesion which is hyperintense on DWI, slightly hypointense on ADC map, hyperintense on T2 and FLAIR views, with heterogeneous enhancement.  Considerations could include subacute infarction, autoimmune, inflammatory or neoplastic etiologies. This is a new finding compared to 02/11/2019. - Mild scattered periventricular, subcortical, pontine chronic small vessel ischemic disease.  CTA head/neck 02/12/2019: 1. Diffuse distal branch vessel irregularity throughout the circle-of-Willis without a significant proximal stenosis or occlusion. 2. Atherosclerotic changes are evident within the cavernous internal carotid arteries and right greater than left M1 segments with diffuse vessel irregularity and mild narrowing of less than 50% relative to the more distal vessels. 3. Tortuosity of the cervical internal carotid arteries  and vertebral arteries bilaterally without significant stenosis. This is nonspecific, but often seen in the setting of chronic hypertension. 4. Brainstem infarct and white matter parenchymal changes noted on MRI are not discernible by CT.  MRI brain 02/11/2019: 1. Patchy small volume acute ischemic nonhemorrhagic infarct involving the left lateral midbrain/pons. 2. Underlying mild chronic microvascular ischemic disease.   Recent labs: 07/21/2021: Glucose 119, BUN/Cr 19/1.94. EGFR 31. Na/K 139/3.7. Rest of the CMP normal H/H 10.7/33.5. MCV 92. Platelets 270 HbA1C 8.6% Chol 88, TG 153, HDL 33, LDL 29 BNP 377 Trop 24-->26  06/25/2020: Glucose 136, BUN/Cr 18/1.49. EGFR 43. Na/K 139/3.5.  H/H 7.7/26.6. MCV 72.7. Platelets 249 Results for JAMILEE, LAFOSSE (MRN [id number]) as of 07/15/2020 16:09  Ref. Range 06/24/2020 21:30 06/24/2020 23:14  B Natriuretic Peptide Latest Ref Range: 0.0 - 100.0 pg/mL 427.9 (H)   Troponin I (High Sensitivity) Latest Ref Range: <18 ng/L 17 18 (H)     11/28/2019: Glucose 177, BUN/Cr 33/2.33. EGFR 28. Na/K 138/4.0. Rest of the CMP normal H/H 11/32. MCV 86. Platelets 254 Chol 167, TG 287, HDL 39, LDL 81  03/12/2019:  Glucose 170.  BUN/creatinine 26/1.3.  EGFR 57.  NA/K1 39/4.2. H/H 12.6/37.5.  MCV 91.  Platelets 245.  Normal iron studies. Hemoglobin A1c 8.5%. Cholesterol 106, triglycerides 103, HDL 38, LDL 47   Review of Systems  Cardiovascular:  Positive for dyspnea on exertion. Negative for chest pain, leg swelling, palpitations and syncope.  Genitourinary:  Negative for dysuria.  Neurological:  Positive for numbness.       Foot drop  All other systems reviewed and are negative.       There were no vitals filed for this visit.    Objective:   Physical Exam Vitals and nursing note reviewed.  Constitutional:      General: She is not in acute distress. HENT:     Head: Normocephalic and atraumatic.  Neck:     Vascular: No JVD.   Cardiovascular:     Rate and Rhythm: Normal rate and regular rhythm.     Pulses: Intact distal pulses.     Heart sounds: Murmur heard.  Early systolic murmur is present with a grade of 2/6 at the lower right sternal border.  Pulmonary:     Effort: Pulmonary effort is normal.     Breath sounds: Normal breath sounds. No rales.  Neurological:     Cranial Nerves: No cranial nerve deficit.          Assessment & Recommendations:   51 y.o. 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  *** Resistant hypertension: Uncontrolled primary hypertension. Currently on amlodipine 10, carvedilol 25 twice daily, BiDil 20-30 7.52 tabs 3 times daily. Renal function has precluded use of spironolactone/ACE or ARB. Change carvedilol 25 twice daily to labetalol 300 twice daily. Given her leg edema, will obtain echocardiogram and added Lasix 40 mg twice daily. While using Lasix, encouraged her to drink 2-3 quarts of water daily.  ***H/o stroke: No Afib on event monitor.  Continue Aspirin, statin.  Type 2 diabetes mellitus: Uncontrolled.  Management as per PCP.  F/u ***  [place], MD [facility] Pager: [phone] Office: [phone] If no answer Cell [phone]

## 2022-01-11 ENCOUNTER — Ambulatory Visit: Payer: Medicaid Other | Admitting: Cardiology

## 2022-01-11 ENCOUNTER — Encounter: Payer: Self-pay | Admitting: Cardiology

## 2022-01-11 VITALS — BP 125/68 | HR 70 | Temp 97.8°F | Resp 16 | Ht 69.0 in | Wt 229.0 lb

## 2022-01-11 DIAGNOSIS — I10 Essential (primary) hypertension: Secondary | ICD-10-CM

## 2022-01-11 DIAGNOSIS — Z01810 Encounter for preprocedural cardiovascular examination: Secondary | ICD-10-CM

## 2022-01-12 NOTE — Pre-Procedure Instructions (Signed)
Surgical Instructions    Your procedure is scheduled on Monday, June 12.  Report to [facility] Main Entrance "A" at 12:40 A.M., then check in with the Admitting office.  Call this number if you have problems the morning of surgery:  [phone]   If you have any questions prior to your surgery date call [phone]: Open Monday-Friday 8am-4pm    Remember:  Do not eat or drink after midnight the night before your surgery   Take these medicines the morning of surgery with A SIP OF WATER:  busPIRone (BUSPAR)  fenofibrate (TRICOR) isosorbide-hydrALAZINE (BIDIL)  labetalol (NORMODYNE) rosuvastatin (CRESTOR)  methocarbamol (ROBAXIN)  if needed cloNIDine (CATAPRES - DOSED IN MG/24 HR) 0.3 mg/24hr patch- if due on day of surgery albuterol (VENTOLIN HFA) 108 (90 Base) MCG/ACT inhaler if needed ipratropium-albuterol (DUONEB if needed fluticasone (FLONASE) 50 MCG/ACT nasal spray if needed  Please bring all inhalers with you the day of surgery.   Follow your surgeon's instructions on when to stop Aspirin.  If no instructions were given by your surgeon then you will need to call the office to get those instructions.    As of today, STOP taking any Aleve, Naproxen, Ibuprofen, Motrin, Advil, Goody's, BC's, all herbal medications, fish oil, and all vitamins.  WHAT DO I DO ABOUT MY DIABETES MEDICATION?   Do not take oral diabetes medicines (pills) the morning of surgery. DO NOT TAKE Glipizide (Glucotrol) on the day of surgery  THE NIGHT BEFORE SURGERY, take 7 units of Novolog 70/30 insulin. (70% of usual dose)    THE MORNING OF SURGERY, DO NOT take Novolog 70/30   HOW TO MANAGE YOUR DIABETES BEFORE AND AFTER SURGERY  Why is it important to control my blood sugar before and after surgery? Improving blood sugar levels before and after surgery helps healing and can limit problems. A way of improving blood sugar control is eating a healthy diet by:  Eating less sugar and carbohydrates   Increasing activity/exercise  Talking with your doctor about reaching your blood sugar goals High blood sugars (greater than 180 mg/dL) can raise your risk of infections and slow your recovery, so you will need to focus on controlling your diabetes during the weeks before surgery. Make sure that the doctor who takes care of your diabetes knows about your planned surgery including the date and location.  How do I manage my blood sugar before surgery? Check your blood sugar at least 4 times a day, starting 2 days before surgery, to make sure that the level is not too high or low.  Check your blood sugar the morning of your surgery when you wake up and every 2 hours until you get to the Short Stay unit.  If your blood sugar is less than 70 mg/dL, you will need to treat for low blood sugar: Do not take insulin. Treat a low blood sugar (less than 70 mg/dL) with  cup of clear juice (cranberry or apple), 4 glucose tablets, OR glucose gel. Recheck blood sugar in 15 minutes after treatment (to make sure it is greater than 70 mg/dL). If your blood sugar is not greater than 70 mg/dL on recheck, call [phone] for further instructions. Report your blood sugar to the short stay nurse when you get to Short Stay.  If you are admitted to the hospital after surgery: Your blood sugar will be checked by the staff and you will probably be given insulin after surgery (instead of oral diabetes medicines) to make sure you have  good blood sugar levels. The goal for blood sugar control after surgery is 80-180 mg/dL.           Do not wear jewelry or makeup Do not wear lotions, powders, perfumes/colognes, or deodorant. Do not shave 48 hours prior to surgery.  Men may shave face and neck. Do not bring valuables to the hospital. Do not wear nail polish, gel polish, artificial nails, or any other type of covering on natural nails (fingers and toes) If you have artificial nails or gel coating that need to be removed  by a nail salon, please have this removed prior to surgery. Artificial nails or gel coating may interfere with anesthesia's ability to adequately monitor your vital signs.  [facility] is not responsible for any belongings or valuables. .   Do NOT Smoke (Tobacco/Vaping)  24 hours prior to your procedure  If you use a CPAP at night, you may bring your mask for your overnight stay.   Contacts, glasses, hearing aids, dentures or partials may not be worn into surgery, please bring cases for these belongings   For patients admitted to the hospital, discharge time will be determined by your treatment team.   Patients discharged the day of surgery will not be allowed to drive home, and someone needs to stay with them for 24 hours.   SURGICAL WAITING ROOM VISITATION Patients having surgery or a procedure in a hospital may have two support people. Children under the age of 94 must have an adult with them who is not the patient. They may stay in the waiting area during the procedure and may switch out with other visitors. If the patient needs to stay at the hospital during part of their recovery, the visitor guidelines for inpatient rooms apply.  Please refer to the [facility] website for the visitor guidelines for Inpatients (after your surgery is over and you are in a regular room).       Special instructions:    Oral Hygiene is also important to reduce your risk of infection.  Remember - BRUSH YOUR TEETH THE MORNING OF SURGERY WITH YOUR REGULAR TOOTHPASTE   [facility]- Preparing For Surgery  Before surgery, you can play an important role. Because skin is not sterile, your skin needs to be as free of germs as possible. You can reduce the number of germs on your skin by washing with CHG (chlorahexidine gluconate) Soap before surgery.  CHG is an antiseptic cleaner which kills germs and bonds with the skin to continue killing germs even after washing.     Please do not use if you have an  allergy to CHG or antibacterial soaps. If your skin becomes reddened/irritated stop using the CHG.  Do not shave (including legs and underarms) for at least 48 hours prior to first CHG shower. It is OK to shave your face.  Please follow these instructions carefully.     Shower the NIGHT BEFORE SURGERY and the MORNING OF SURGERY with CHG Soap.   If you chose to wash your hair, wash your hair first as usual with your normal shampoo. After you shampoo, rinse your hair and body thoroughly to remove the shampoo.  Then [employer] and genitals (private parts) with your normal soap and rinse thoroughly to remove soap.  After that Use CHG Soap as you would any other liquid soap. You can apply CHG directly to the skin and wash gently with a scrungie or a clean washcloth.   Apply the CHG Soap to  your body ONLY FROM THE NECK DOWN.  Do not use on open wounds or open sores. Avoid contact with your eyes, ears, mouth and genitals (private parts). Wash Face and genitals (private parts)  with your normal soap.   Wash thoroughly, paying special attention to the area where your surgery will be performed.  Thoroughly rinse your body with warm water from the neck down.  DO NOT shower/wash with your normal soap after using and rinsing off the CHG Soap.  Pat yourself dry with a CLEAN TOWEL.  Wear CLEAN PAJAMAS to bed the night before surgery  Place CLEAN SHEETS on your bed the night before your surgery  DO NOT SLEEP WITH PETS.   Day of Surgery:  Take a shower with CHG soap. Wear Clean/Comfortable clothing the morning of surgery Do not apply any deodorants/lotions.   Remember to brush your teeth WITH YOUR REGULAR TOOTHPASTE.    If you received a COVID test during your pre-op visit, it is requested that you wear a mask when out in public, stay away from anyone that may not be feeling well, and notify your surgeon if you develop symptoms. If you have been in contact with anyone that has tested positive in  the last 10 days, please notify your surgeon.    Please read over the following fact sheets that you were given.

## 2022-01-13 ENCOUNTER — Other Ambulatory Visit: Payer: Self-pay

## 2022-01-13 ENCOUNTER — Encounter (HOSPITAL_COMMUNITY)
Admission: RE | Admit: 2022-01-13 | Discharge: 2022-01-13 | Disposition: A | Discharge status: Home / Self Care | Payer: Medicaid Other | Source: Ambulatory Visit | Attending: Neurosurgery | Admitting: Neurosurgery

## 2022-01-13 ENCOUNTER — Encounter (HOSPITAL_COMMUNITY): Payer: Self-pay

## 2022-01-13 VITALS — BP 130/74 | HR 74 | Temp 97.8°F | Resp 17 | Ht 69.0 in | Wt 233.1 lb

## 2022-01-13 DIAGNOSIS — G4733 Obstructive sleep apnea (adult) (pediatric): Secondary | ICD-10-CM | POA: Diagnosis not present

## 2022-01-13 DIAGNOSIS — Z8673 Personal history of transient ischemic attack (TIA), and cerebral infarction without residual deficits: Secondary | ICD-10-CM | POA: Insufficient documentation

## 2022-01-13 DIAGNOSIS — E118 Type 2 diabetes mellitus with unspecified complications: Secondary | ICD-10-CM | POA: Diagnosis not present

## 2022-01-13 DIAGNOSIS — Z794 Long term (current) use of insulin: Secondary | ICD-10-CM | POA: Insufficient documentation

## 2022-01-13 DIAGNOSIS — Z7982 Long term (current) use of aspirin: Secondary | ICD-10-CM | POA: Diagnosis not present

## 2022-01-13 DIAGNOSIS — D649 Anemia, unspecified: Secondary | ICD-10-CM | POA: Insufficient documentation

## 2022-01-13 DIAGNOSIS — R7989 Other specified abnormal findings of blood chemistry: Secondary | ICD-10-CM | POA: Diagnosis not present

## 2022-01-13 DIAGNOSIS — Z01812 Encounter for preprocedural laboratory examination: Secondary | ICD-10-CM | POA: Diagnosis present

## 2022-01-13 DIAGNOSIS — E1165 Type 2 diabetes mellitus with hyperglycemia: Secondary | ICD-10-CM | POA: Insufficient documentation

## 2022-01-13 DIAGNOSIS — I119 Hypertensive heart disease without heart failure: Secondary | ICD-10-CM

## 2022-01-13 DIAGNOSIS — E785 Hyperlipidemia, unspecified: Secondary | ICD-10-CM | POA: Diagnosis not present

## 2022-01-13 DIAGNOSIS — I10 Essential (primary) hypertension: Secondary | ICD-10-CM | POA: Insufficient documentation

## 2022-01-13 DIAGNOSIS — Z01818 Encounter for other preprocedural examination: Secondary | ICD-10-CM

## 2022-01-13 LAB — HEMOGLOBIN A1C
Hgb A1c MFr Bld: 9.6 % — ABNORMAL HIGH (ref 4.8–5.6)
Mean Plasma Glucose: 228.82 mg/dL

## 2022-01-13 LAB — CBC
HCT: 31 % — ABNORMAL LOW (ref 36.0–46.0)
Hemoglobin: 10.4 g/dL — ABNORMAL LOW (ref 12.0–15.0)
MCH: 31 pg (ref 26.0–34.0)
MCHC: 33.5 g/dL (ref 30.0–36.0)
MCV: 92.3 fL (ref 80.0–100.0)
Platelets: 248 10*3/uL (ref 150–400)
RBC: 3.36 MIL/uL — ABNORMAL LOW (ref 3.87–5.11)
RDW: 13.8 % (ref 11.5–15.5)
WBC: 6 10*3/uL (ref 4.0–10.5)
nRBC: 0 % (ref 0.0–0.2)

## 2022-01-13 LAB — COMPREHENSIVE METABOLIC PANEL
ALT: 10 U/L (ref 0–44)
AST: 11 U/L — ABNORMAL LOW (ref 15–41)
Albumin: 3.3 g/dL — ABNORMAL LOW (ref 3.5–5.0)
Alkaline Phosphatase: 55 U/L (ref 38–126)
Anion gap: 8 (ref 5–15)
BUN: 21 mg/dL — ABNORMAL HIGH (ref 6–20)
CO2: 22 mmol/L (ref 22–32)
Calcium: 8 mg/dL — ABNORMAL LOW (ref 8.9–10.3)
Chloride: 107 mmol/L (ref 98–111)
Creatinine, Ser: 2.92 mg/dL — ABNORMAL HIGH (ref 0.44–1.00)
GFR, Estimated: 19 mL/min — ABNORMAL LOW (ref >60)
Glucose, Bld: 188 mg/dL — ABNORMAL HIGH (ref 70–99)
Potassium: 4.1 mmol/L (ref 3.5–5.1)
Sodium: 137 mmol/L (ref 135–145)
Total Bilirubin: 0.3 mg/dL (ref 0.3–1.2)
Total Protein: 6.9 g/dL (ref 6.5–8.1)

## 2022-01-13 LAB — SURGICAL PCR SCREEN
MRSA, PCR: NEGATIVE
Staphylococcus aureus: POSITIVE — AB

## 2022-01-13 LAB — GLUCOSE, CAPILLARY: Glucose-Capillary: 187 mg/dL — ABNORMAL HIGH (ref 70–99)

## 2022-01-13 NOTE — Progress Notes (Signed)
PCP - Dr. Vista Lawman Cardiologist - Dr. Vernell Leep  PPM/ICD - Denies Device Orders -  Rep Notified -   Chest x-ray - NI EKG - 6/6/2 Stress Test - Denies ECHO - 07/27/21 Cardiac Cath - Denies  Sleep Study - Yes has OSA CPAP - nightly  DM - Type II CBG at PAT appt 187 Fasting Blood Sugar - 109-230 Checks Blood Sugar __1-2___ times a day  Blood Thinner Instructions: Denies Aspirin Instructions: Instructed by Dr. Virgina Jock to stop Aspirin on 01/10/22 last dose 01/10/22  COVID TEST- NI   Anesthesia review: Yes cardiac history  Patient denies shortness of breath, fever, cough and chest pain at PAT appointment   All instructions explained to the patient, with a verbal understanding of the material. Patient agrees to go over the instructions while at home for a better understanding.  The opportunity to ask questions was provided.

## 2022-01-14 ENCOUNTER — Encounter (HOSPITAL_COMMUNITY): Payer: Self-pay | Admitting: Physician Assistant

## 2022-01-14 NOTE — Progress Notes (Signed)
Anesthesia Chart Review:  Follows with cardiology for history of cryptogenic CVA, resistant hypertension, HLD.  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  Most recent echo 07/2021 showed severe concentric hypertrophy of the left ventricle with mild LVOT obstruction, EF 55%, moderate aortic regurgitation, mild mitral regurgitation, mild tricuspid regurgitation, mild pulmonic regurgitation, no sudden change from prior study.  Seen by Dr. Virgina Jock 01/11/2022 for preop evaluation.  Per letter dated same date, "[name] is at moderate risk, from a cardiac standpoint, for her upcoming procedure: C4-5 anterior cervical fusion under general anesthesia.  It is ok to proceed without further cardiac testing. If applicable can hold Aspirin for 5-7  day(s) prior to procedure and re-start when deemed safe by the surgeon  days post procedure."  OSA on CPAP.  Preop labs notable for elevated creatinine at 2.92, anemia with hemoglobin 10.4, uncontrolled IDDM 2 with A1c 9.6.  Most recent comparison for creatinine was 2.13 on 07/27/2021.  I called and spoke with patient's PCP Dr. Vista Lawman.  He advised that given significant increase in creatinine, he recommend patient postpone surgery for further evaluation.  I called and spoke with the patient, she verbalized understanding and will reach out to Dr. Matthias Hughs office.  I also reached out to [facility] Dr. Irven [place] office to notify her that the case will need to be postponed.  EKG 01/11/2022: Sinus rhythm.  Rate 68.  Old anteroseptal infarct.  Lateral T wave inversions, similar to prior.  Echocardiogram 07/27/2021:  Left ventricle cavity is normal in size. 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 Max gradient 10 mmHg.    Normal global wall motion. Normal LV systolic function with EF 55%.  Indeterminate diastolic filling pattern.  Structurally normal trileaflet aortic valve. No evidence of aortic  stenosis.  Moderate (Grade II) aortic regurgitation.  Mild (Grade I) mitral regurgitation.  Mild tricuspid regurgitation. Estimated pulmonary artery systolic pressure  27 mmHg.  Mild pulmonic regurgitation.  No significant change compared to previous study on 10/16/2019.  Event monitor 09/17/2019 - 10/16/2019: Diagnostic time: 94%  Dominant rhythm: Sinus. HR 66-112 bpm. Avg HR 80 bpm. 6 episodes of NSVT up to 9 beats seen.  No atrial fibrillation/atrial flutter/SVT/high grade AV block, sinus pause >3sec noted. Symptoms reported: None   Wynonia Musty [facility] Short Stay Center/Anesthesiology Phone [phone] 01/14/2022 10:11 AM

## 2022-01-17 ENCOUNTER — Encounter (HOSPITAL_COMMUNITY): Admission: RE | Payer: Self-pay | Source: Home / Self Care

## 2022-01-17 ENCOUNTER — Ambulatory Visit (HOSPITAL_COMMUNITY): Admission: RE | Admit: 2022-01-17 | Payer: Medicaid Other | Source: Home / Self Care | Admitting: Neurosurgery

## 2022-01-17 SURGERY — ANTERIOR CERVICAL DECOMPRESSION/DISCECTOMY FUSION 1 LEVEL
Anesthesia: General

## 2022-01-24 NOTE — Progress Notes (Unsigned)
[facility] [address] [place]. [place] [address] [phone]       OFFICE FOLLOW-UP NOTE  [name] Date of Birth:  17-Aug-1970 Medical Record Number:  [id number]   HPI: Initial video visit 07/24/2019 : [name] is a 51 year old African-American lady seen today for initial virtual video consultation visit for stroke and memory loss.  History is obtained from the patient, review of electronic medical records and referral notes and I personally reviewed imaging films in PACS.  She developed sudden onset of facial numbness as well as right-sided weakness and gait difficulty on 02/12/2019 and was admitted to [facility].  She presented beyond time window for thrombolysis.  MRI scan of the brain showed a small midbrain and brainstem lacunar infarct.  CT angiogram of the brain and neck both did not reveal significant large vessel stenosis or occlusion.  Transthoracic echo showed normal ejection fraction.  Hemoglobin A1c was elevated at 8.8 and LDL cholesterol was 57 mg percent.  Patient is started on dual antiplatelet therapy and advised aggressive risk factor modification and treatment for sleep apnea with CPAP.  Patient improved gradually with physical occupational therapy was initially walking with a cane and now she can walk independently.  She still has some mild residual right facial numbness but right-sided strength has improved.  She walks independently indoors and short distances and uses cane only for long distances.  The patient has not had any recurrent stroke or TIA symptoms since then.  She states her blood pressure is well controlled and sugars are also doing better.  She however has not been able to tolerate her CPAP ever since Covid pandemic began and has not seen a medical doctor for troubleshooting this.  She however is concerned today about memory and cognitive difficulties that she has been having ever since the stroke.  She states that she has  trouble finding words and at times completing sentences.  She may remember the words later on.  She denies feeling depressed or anxious.  She denies headache or any other new neurological symptoms.  She is still independent and manages all her affairs.  There is strong family history of multiple family members with memory problems as well as strokes.  There is no history of significant head injury with loss of consciousness, seizures or drug abuse.  She has not had any recent work-up for her memory loss issues on her brain imaging study Update 09/16/2019 : She returns for follow-up after last video visit 2 months ago.  She continues to have short-term memory difficulties but feels its not as bad.  She has learned to strategize and write things down which seems to be helping.  At last visit she had complained of new memory issues & obtained lab work for reversible causes of memory loss and vitamin B12, TSH and RPR were negative on 07/31/2019.Marland Kitchen  EEG on 08/19/2019 was normal.  MRI scan of the brain on 08/21/2019 shows a faintly enhancing right corpus callosal and parasagittal frontal subacute infarct.  Patient had previously in July 2020 had extensive stroke work-up which was negative.  Patient states she has since started using CPAP regularly after she saw her primary care physician recently.  She has been having problems with constipation and has also had alternate watery diarrhea and she is seeing her primary care physician for the same.  She remains on aspirin for stroke prevention which she is tolerating well without bruising or bleeding.  She is tolerating Lipitor  well without muscle aches and pains.  Her blood pressure is usually better controlled to slightly elevated in office today.   Update 05/28/2020 : She returns for follow-up after last visit 8 months ago.  She has not had no further stroke or TIA symptoms.  She states her memory is also fine.  Headaches are much improved she did find benefit after doing  stress relaxation activities.  She does virtual reality game playing which helps her relax a lot.  She remains on aspirin which is tolerating well without bruising or bleeding.  Blood pressure is still running high and today it is elevated in office at 176/84.  She is working with the primary care physician to get it under better control.  She states her fasting sugars usually run in the 120s.  She remains on Crestor which is tolerating well without muscle aches and pains.  She states she is eating healthy and has lost about 15 pounds.  She has no new complaints today.  She had echocardiogram done on 10/16/2019 which showed normal ejection fraction.  Transcranial Doppler bubble study done on 09/25/2019 was negative for PFO.  She had 30-day heart monitor from 09/17/2019 to 10/16/2019 which showed no significant arrhythmias or atrial fibrillation.  CT angiogram of the brain and neck done on 10/08/2019 showed no large vessel stenosis or occlusion and only mild atherosclerotic changes.  No significant change compared with previous CTA from July 2020. Update 07/20/2021: She returns for follow-up after last visit to more than a year ago.  Patient states she is doing well without recurrent stroke or TIA symptoms.  She remains on aspirin which she is tolerating well without bleeding or bruising.  Blood pressures under good control.  She is not been checking her sugars regularly as she recently moved and a lot of her stuff is still in storage.  She continues to have short-term memory difficulties and needs to remind herself constantly and sometimes forgets recent information.  However on the Mini-Mental status exam today she scored 30/30 in the office and was able to name 14 animals and clock drawing was normal.  She states she is not had any headaches of late.  She has no new complaints.   Update 01/24/2022 JM: Patient returns for 16-monthfollow-up.  She has been stable without new stroke/TIA symptoms.  Reports continued  short-term memory difficulties, stable since prior visit.  She has not had any recent headaches.  Compliant on aspirin and Crestor, denies side effects.  Blood pressure today ***.  Routinely follows with PCP, cardiology, and pulmonology.       ROS:   14 system review of systems is positive for  Headache, stress, memory impairment, disturbed sleep, weakness and all other systems negative PMH:  Past Medical History:  Diagnosis Date   Arthritis    Asthma    Cataract    Mixed form OU   CKD (chronic kidney disease)    Coronary artery disease    Diabetes mellitus    Diabetic retinopathy (HCC)    NPDR OU   Hyperlipidemia    Hypertension    Hypertensive retinopathy    OU   Left thyroid nodule    diagnosed 07/2018   PAC (premature atrial contraction) 02/15/2021   Pseudotumor cerebri    Stroke ([facility]    Vitamin D deficiency     Social History:  Social History   Socioeconomic History   Marital status: Single    Spouse name: Not on file   Number  of children: 4   Years of education: Not on file   Highest education level: Not on file  Occupational History   Occupation: unemployed  Tobacco Use   Smoking status: Former    Packs/day: 0.50    Years: 30.00    Total pack years: 15.00    Types: Cigarettes    Quit date: 2017    Years since quitting: 6.4   Smokeless tobacco: Never  Vaping Use   Vaping Use: Never used  Substance and Sexual Activity   Alcohol use: Not Currently    Alcohol/week: 0.0 standard drinks of alcohol    Comment: rare   Drug use: Yes    Types: Marijuana    Comment: daily   Sexual activity: Not Currently    Partners: Male    Birth control/protection: Surgical  Other Topics Concern   Not on file  Social History Narrative   Lives with 3 kids   Right handed   Drinks 2 cups caffeine daily   Social Determinants of Health   Financial Resource Strain: Not on file  Food Insecurity: Food Insecurity Present (02/15/2021)   Hunger Vital Sign    Worried  About Running Out of Food in the Last Year: Sometimes true    Ran Out of Food in the Last Year: Never true  Transportation Needs: No Transportation Needs (02/15/2021)   PRAPARE - Hydrologist (Medical): No    Lack of Transportation (Non-Medical): No  Physical Activity: Inactive (02/15/2021)   Exercise Vital Sign    Days of Exercise per Week: 0 days    Minutes of Exercise per Session: 0 min  Stress: Stress Concern Present (02/15/2021)   [place]    Feeling of Stress : Very much  Social Connections: Moderately Isolated (02/15/2021)   Social Connection and Isolation Panel [NHANES]    Frequency of Communication with Friends and Family: Three times a week    Frequency of Social Gatherings with Friends and Family: Three times a week    Attends Religious Services: More than 4 times per year    Active Member of Clubs or Organizations: No    Attends Archivist Meetings: Never    Marital Status: Never married  Intimate Partner Violence: Not At Risk (02/15/2021)   Humiliation, Afraid, Rape, and Kick questionnaire    Fear of Current or Ex-Partner: No    Emotionally Abused: No    Physically Abused: No    Sexually Abused: No    Medications:   Current Outpatient Medications on File Prior to Visit  Medication Sig Dispense Refill   Accu-Chek FastClix Lancets MISC 4 (four) times daily.     ACCU-CHEK GUIDE test strip 1 each by Other route See admin instructions. for testing 100 each 2   albuterol (VENTOLIN HFA) 108 (90 Base) MCG/ACT inhaler Inhale 1-2 puffs into the lungs every 6 (six) hours as needed for wheezing or shortness of breath.     amLODipine-benazepril (LOTREL) 10-40 MG capsule Take 1 capsule by mouth daily.     aspirin 81 MG EC tablet Take 1 tablet (81 mg total) by mouth daily. 90 tablet 2   blood glucose meter kit and supplies KIT Dispense based on patient and insurance preference. Use  up to four times daily as directed. (FOR ICD-9 250.00, 250.01). 1 each 0   busPIRone (BUSPAR) 5 MG tablet Take 5 mg by mouth 2 (two) times daily.     cloNIDine (  CATAPRES - DOSED IN MG/24 HR) 0.3 mg/24hr patch Place 1 patch (0.3 mg total) onto the skin once a week. 4 patch 12   EPINEPHrine 0.3 mg/0.3 mL IJ SOAJ injection Inject 0.3 mg into the muscle as needed for anaphylaxis. 2 each 0   fenofibrate (TRICOR) 48 MG tablet Take 48 mg by mouth daily.     fluticasone (FLONASE) 50 MCG/ACT nasal spray Place 2 sprays into both nostrils daily as needed for allergies or rhinitis.     furosemide (LASIX) 40 MG tablet Take 1 tablet (40 mg total) by mouth daily. 120 tablet 3   glipiZIDE (GLUCOTROL) 10 MG tablet Take 10 mg by mouth daily before breakfast.     hydrOXYzine (VISTARIL) 25 MG capsule Take 25 mg by mouth at bedtime.     insulin aspart protamine- aspart (NOVOLOG MIX 70/30) (70-30) 100 UNIT/ML injection Inject 0.15 mLs (15 Units total) into the skin 2 (two) times daily with a meal. (Patient taking differently: Inject 10 Units into the skin 2 (two) times daily with a meal.) 10 mL 11   Insulin Syringes, Disposable, Y-774 1 ML MISC 1 application by Does not apply route 2 (two) times a day. 100 each 0   ipratropium-albuterol (DUONEB) 0.5-2.5 (3) MG/3ML SOLN Take 3 mLs by nebulization every 4 (four) hours as needed. 360 mL 0   isosorbide-hydrALAZINE (BIDIL) 20-37.5 MG tablet Take 2 tablets by mouth 3 (three) times daily. 540 tablet 3   labetalol (NORMODYNE) 300 MG tablet Take 300 mg by mouth 2 (two) times daily.     methocarbamol (ROBAXIN) 750 MG tablet Take 1 tablet (750 mg total) by mouth every 8 (eight) hours as needed (muscle spasm/pain). 15 tablet 0   rosuvastatin (CRESTOR) 40 MG tablet Take 40 mg by mouth daily.     No current facility-administered medications on file prior to visit.    Allergies:   Allergies  Allergen Reactions   Bee Pollen Other (See Comments)    Seasonal Allergies   Dust Mite  Extract Cough    Sneezing & Cough   Mixed Ragweed Itching    Physical Exam There were no vitals filed for this visit. There is no height or weight on file to calculate BMI.   General: Obese middle-aged African-American lady seated, in no evident distress Head: head normocephalic and atraumatic.  Neck: supple with no carotid or supraclavicular bruits Cardiovascular: regular rate and rhythm, no murmurs Musculoskeletal: no deformity Skin:  no rash/petichiae Vascular:  Normal pulses all extremities  Neurologic Exam Mental Status: Awake and fully alert. Oriented to place and time. Recent and remote memory intact. Attention span, concentration and fund of knowledge appropriate. Mood and affect appropriate.  Recall 3/3.Marland Kitchen  Mini-Mental status exam 30/30 Clock drawing 4/4. Animal Naming 14. cranial Nerves: Fundoscopic exam not done. Pupils equal, briskly reactive to light. Extraocular movements full without nystagmus. Visual fields full to confrontation. Hearing intact. Facial sensation intact. Face, tongue, palate moves normally and symmetrically.  Motor: Normal bulk and tone. Normal strength in all tested extremity muscles. Sensory.: intact to touch ,pinprick .position and vibratory sensation.  Coordination: Rapid alternating movements normal in all extremities. Finger-to-nose and heel-to-shin performed accurately bilaterally. Gait and Station: Arises from chair without difficulty. Stance is normal. Gait demonstrates normal stride length and balance . Able to heel, toe and tandem walk with moderate difficulty.  Mildly unsteady on a narrow base.  Difficulties standing on the toes and heel.   Reflexes: 1+ and symmetric except ankle jerks are depressed.  Toes downgoing.       07/20/2021    3:04 PM 09/16/2019   11:03 AM  MMSE - Mini Mental State Exam  Orientation to time 5 5  Orientation to Place 5 5  Registration 3 3  Attention/ Calculation 5 1  Recall 3 1  Language- name 2 objects 2 2   Language- repeat 1 1  Language- follow 3 step command 3 3  Language- read & follow direction 1 1  Write a sentence 1 1  Copy design 1 1  Total score 30 24        ASSESSMENT/PLAN:   1.  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  Mild complaints of memory difficulties likely due to mild cognitive impairment  -stable  -continue increase participation in cognitively challenging activities like solving crossword puzzles, playing bridge and sodoku.   -We discussed also memory compensation strategies as well as importance of managing stroke risk factors, ensuring adequate sleep, healthy diet and routine exercise             CC:  Osei-Bonsu, [name], MD   I spent *** minutes of face-to-face and non-face-to-face time with patient.  This included previsit chart review, lab review, study review, order entry, electronic health record documentation, patient education  Frann Rider, [facility]  [facility] [address] [place], [place] [address]  Phone [phone] Fax [phone] Note: This document was prepared with digital dictation and possible smart phrase technology. Any transcriptional errors that result from this process are unintentional.

## 2022-01-25 ENCOUNTER — Ambulatory Visit: Payer: Medicaid Other | Admitting: Adult Health

## 2022-01-25 ENCOUNTER — Encounter: Payer: Self-pay | Admitting: Adult Health

## 2022-01-25 VITALS — BP 160/89 | HR 76 | Ht 69.0 in | Wt 230.0 lb

## 2022-01-25 DIAGNOSIS — Z8673 Personal history of transient ischemic attack (TIA), and cerebral infarction without residual deficits: Secondary | ICD-10-CM

## 2022-01-25 DIAGNOSIS — G3184 Mild cognitive impairment, so stated: Secondary | ICD-10-CM | POA: Diagnosis not present

## 2022-01-25 NOTE — Patient Instructions (Addendum)
Your Plan:  Continue current treatment plan and continue close follow-up with PCP and cardiology for aggressive stroke risk factor management  Highly encourage routine cognitive exercises such as crossword puzzles, word search, sudoku, card games and reading.  She is also important to manage stroke risk factors, ensure adequate sleep, healthy diet and routine exercise       Thank you for coming to see [place] at [facility]. I hope we have been able to provide you high quality care today.  You may receive a patient satisfaction survey over the next few weeks. We would appreciate your feedback and comments so that we may continue to improve ourselves and the health of our patients.

## 2022-02-02 ENCOUNTER — Telehealth: Payer: Self-pay | Admitting: *Deleted

## 2022-02-04 ENCOUNTER — Encounter: Payer: Medicaid Other | Admitting: Registered"

## 2022-02-07 ENCOUNTER — Encounter: Payer: Medicaid Other | Attending: Internal Medicine | Admitting: Dietician

## 2022-02-07 ENCOUNTER — Encounter: Payer: Self-pay | Admitting: Dietician

## 2022-02-07 DIAGNOSIS — E782 Mixed hyperlipidemia: Secondary | ICD-10-CM | POA: Insufficient documentation

## 2022-02-07 DIAGNOSIS — E1165 Type 2 diabetes mellitus with hyperglycemia: Secondary | ICD-10-CM | POA: Insufficient documentation

## 2022-02-07 DIAGNOSIS — E785 Hyperlipidemia, unspecified: Secondary | ICD-10-CM | POA: Insufficient documentation

## 2022-02-07 DIAGNOSIS — N1832 Chronic kidney disease, stage 3b: Secondary | ICD-10-CM | POA: Diagnosis not present

## 2022-02-07 DIAGNOSIS — I5032 Chronic diastolic (congestive) heart failure: Secondary | ICD-10-CM

## 2022-02-07 DIAGNOSIS — Z713 Dietary counseling and surveillance: Secondary | ICD-10-CM | POA: Diagnosis not present

## 2022-02-07 DIAGNOSIS — E559 Vitamin D deficiency, unspecified: Secondary | ICD-10-CM | POA: Insufficient documentation

## 2022-02-07 DIAGNOSIS — I110 Hypertensive heart disease with heart failure: Secondary | ICD-10-CM | POA: Diagnosis not present

## 2022-02-07 DIAGNOSIS — E669 Obesity, unspecified: Secondary | ICD-10-CM

## 2022-02-07 NOTE — Progress Notes (Signed)
Diabetes Self-Management Education  Visit Type: First/Initial  Appt. Start Time: 1530 Appt. End Time: 3536  02/07/2022  [name], identified by name and date of birth, is a 51 y.o. female with a diagnosis of Diabetes: Type 2.   ASSESSMENT Patient is here today alone.  She was last seen for core classes in 2014. She sees Dr. Clover Mealy (nephologist).  Referral diagnosis:  Type 2 Diabetes, CKD stage 3b, HLD, Hypertensive heart failure  History includes: Type 2 Diabetes, GDM, HTN, hypertensive heart failure, HLD, vitamin D deficiency, CKD stage 3b, CVA, vitamin D deficiency, quit smoking cigarettes 2017, smokes marijuana Labs noted to include:  A1C 9.6% (01/13/2022) decreased from 11% (11/01/2021), BUN 21, Creatinine 2.92, Potassium 4.1, GFR 19 (01/13/2022), vitamin B-12>2000 (07/2019) Medications include:  glipizide, Novolog 70/30 20 units bid Intake is inadequate in protein and calories.  Patient skips meals frequently and goes 2-3 days at a time without eating.  No visual symptoms of nutrient deficiency observed in nails.  Some hair loss and fluid retention.  Weight hx: 243 lbs 02/07/2022 230 lbs 01/21/2022 220 lbs UBW   Blood glucose meter provided: AccuCheck Guide Me, Lot [id number], expiration 12/28/2022. Patient could demonstrate use and blood glucose was 146.  Patient lives with her brother-in-law and her 49 and 62 yo children.  She does the shopping and cooking. She has applied for disability.   Patient has back pain from an accident and states she was to have surgery but cannot currently due to her renal function. Gets food stamps. Uses the food pantry. Avoids salt. She has a problem affording her medication.  She does not eat at times for 2 days as she is not hungry.  She states that she has always gone days without eating and will not eat if not hungry. Lactose intolerant.  Avoids most red meat She moved from [facility] in 2006.  Height 5\' 9"  (1.753 m), weight 243 lb (110.2  kg). Body mass index is 35.88 kg/m.   Diabetes Self-Management Education - 02/07/22 1546       Visit Information   Visit Type First/Initial      Initial Visit   Diabetes Type Type 2    Date Diagnosed 2003    Are you currently following a meal plan? No    Are you taking your medications as prescribed? No   does not afford this at times     Health Coping   How would you rate your overall health? Fair      Psychosocial Assessment   Patient Belief/Attitude about Diabetes Defeat/Burnout    What is the hardest part about your diabetes right now, causing you the most concern, or is the most worrisome to you about your diabetes?   Taking/obtaining medications    Self-care barriers None    Self-management support Doctor's office    Other persons present Patient    Patient Concerns Nutrition/Meal planning    Special Needs None    Preferred Learning Style No preference indicated    Learning Readiness Ready    How often do you need to have someone help you when you read instructions, pamphlets, or other written materials from your doctor or pharmacy? 1 - Never    What is the last grade level you completed in school? some collegge      Pre-Education Assessment   Patient understands the diabetes disease and treatment process. Needs Review    Patient understands incorporating nutritional management into lifestyle. Needs Review  Patient undertands incorporating physical activity into lifestyle. Needs Review    Patient understands using medications safely. Needs Review    Patient understands monitoring blood glucose, interpreting and using results Needs Review    Patient understands prevention, detection, and treatment of acute complications. Needs Review    Patient understands prevention, detection, and treatment of chronic complications. Needs Review    Patient understands how to develop strategies to address psychosocial issues. Needs Review    Patient understands how to develop  strategies to promote health/change behavior. Needs Review      Complications   Last HgB A1C per patient/outside source 9.6 %   01/13/2022 decreased from 11% 11/01/2021   How often do you check your blood sugar? 0 times/day (not testing)   bettery did not work in meter and was not checking for the past 2 weeks   Postprandial Blood glucose range (mg/dL) 130-179    Number of hypoglycemic episodes per month 1    Can you tell when your blood sugar is low? Yes    What do you do if your blood sugar is low? eats a peanut butter sandwich or juice    Have you had a dilated eye exam in the past 12 months? No    Have you had a dental exam in the past 12 months? No    Are you checking your feet? No      Dietary Intake   Breakfast SKIPS OR bacon, eggs OR farina or oatmeal    Lunch fruit bowl    Snack (afternoon) occasional fruit    Dinner fruit bowl OR hamburger patty    Beverage(s) water, black coffee with lemon occasionally, sweet tea, occasional regular soda      Activity / Exercise   Activity / Exercise Type Light (walking / raking leaves)   goal of 3,000 steps per day     Patient Education   Previous Diabetes Education Yes (please comment)   2014 classes   Healthy Eating Meal options for control of blood glucose level and chronic complications.;Plate Method;Food label reading, portion sizes and measuring food.    Being Active Role of exercise on diabetes management, blood pressure control and cardiac health.;Helped patient identify appropriate exercises in relation to his/her diabetes, diabetes complications and other health issue.    Medications Reviewed patients medication for diabetes, action, purpose, timing of dose and side effects.;Other (comment)   financial assistance info for insulin provided   Monitoring Taught/evaluated SMBG meter.    Chronic complications Relationship between chronic complications and blood glucose control    Diabetes Stress and Support Worked with patient to identify  barriers to care and solutions;Identified and addressed patients feelings and concerns about diabetes      Individualized Goals (developed by patient)   Nutrition General guidelines for healthy choices and portions discussed    Physical Activity Exercise 3-5 times per week;15 minutes per day    Medications take my medication as prescribed    Monitoring  Test my blood glucose as discussed    Problem Solving Medication consistency;Eating Pattern    Reducing Risk do foot checks daily      Post-Education Assessment   Patient understands the diabetes disease and treatment process. Comprehends key points    Patient understands incorporating nutritional management into lifestyle. Needs Review    Patient undertands incorporating physical activity into lifestyle. Comprehends key points    Patient understands using medications safely. Comphrehends key points    Patient understands monitoring blood glucose, interpreting and  using results Comprehends key points    Patient understands prevention, detection, and treatment of acute complications. Demonstrates understanding / competency    Patient understands prevention, detection, and treatment of chronic complications. Demonstrates understanding / competency    Patient understands how to develop strategies to address psychosocial issues. Comprehends key points    Patient understands how to develop strategies to promote health/change behavior. Comprehends key points      Outcomes   Expected Outcomes Demonstrated interest in learning but significant barriers to change    Future DMSE 3-4 months    Program Status Not Completed             Individualized Plan for Diabetes Self-Management Training:   Learning Objective:  Patient will have a greater understanding of diabetes self-management. Patient education plan is to attend individual and/or group sessions per assessed needs and concerns.   Plan:   Patient Instructions  Breakfast, Lunch, and  dinner daily Nutrition is very important! Small portions of protein with each meal Continue to avoid salt and processed food Never use salt substitute such as Lite or No salt as this is Potassium salt Read your labels and avoid Continue to avoid dark soda Rethink what you drink to avoid those with sugar. Great job on the water!  Follow the fluid restriction provided by your doctor.  Breakfast:  Domenick Gong, fruit OR 1egg, toast, fruit Lunch:  Peanut butter or tuna or cheese sandwich, fruit Dinner:  small portion of lean meat, vegetables  Walk or other exercise as you are able.    Expected Outcomes:  Demonstrated interest in learning but significant barriers to change  Education material provided: NKD national kidney diet placemat, low sodium nutrition therapy from AND, heart healthy label reading  If problems or questions, patient to contact team via:  Phone  Future DSME appointment: 3-4 months

## 2022-02-07 NOTE — Patient Instructions (Addendum)
Breakfast, Lunch, and dinner daily Nutrition is very important! Small portions of protein with each meal Continue to avoid salt and processed food Never use salt substitute such as Lite or No salt as this is Potassium salt Read your labels and avoid Continue to avoid dark soda Rethink what you drink to avoid those with sugar. Great job on the water!  Follow the fluid restriction provided by your doctor.  Breakfast:  [name], fruit OR 1egg, toast, fruit Lunch:  Peanut butter or tuna or cheese sandwich, fruit Dinner:  small portion of lean meat, vegetables  Walk or other exercise as you are able.

## 2022-03-01 ENCOUNTER — Encounter: Payer: Self-pay | Admitting: Podiatry

## 2022-03-01 ENCOUNTER — Ambulatory Visit: Payer: Medicaid Other | Admitting: Podiatry

## 2022-03-01 DIAGNOSIS — Z89411 Acquired absence of right great toe: Secondary | ICD-10-CM | POA: Diagnosis not present

## 2022-03-01 DIAGNOSIS — E1142 Type 2 diabetes mellitus with diabetic polyneuropathy: Secondary | ICD-10-CM

## 2022-03-01 DIAGNOSIS — Z794 Long term (current) use of insulin: Secondary | ICD-10-CM

## 2022-03-01 DIAGNOSIS — M79675 Pain in left toe(s): Secondary | ICD-10-CM | POA: Diagnosis not present

## 2022-03-01 DIAGNOSIS — M79674 Pain in right toe(s): Secondary | ICD-10-CM

## 2022-03-01 DIAGNOSIS — B351 Tinea unguium: Secondary | ICD-10-CM | POA: Diagnosis not present

## 2022-03-06 NOTE — Progress Notes (Signed)
  Subjective:  Patient ID: [name], female    DOB: 08-08-1971,  MRN: [id number]  [name] presents to clinic today for at risk foot care with history of diabetic neuropathy and corn(s) b/l lower extremities and painful thick toenails that are difficult to trim. Painful toenails interfere with ambulation. Aggravating factors include wearing enclosed shoe gear. Pain is relieved with periodic professional debridement. Painful corns are aggravated when weightbearing when wearing enclosed shoe gear. Pain is relieved with periodic professional debridement.  Patient states blood glucose was 155 mg/dl today.  Last A1c was around 9%.  New problem(s): None.   PCP is Osei-Bonsu, [name], MD , and last visit was  March 01, 2022  Allergies  Allergen Reactions   Bee Pollen Other (See Comments)    Seasonal Allergies   Dust Mite Extract Cough    Sneezing & Cough   Mixed Ragweed Itching   Review of Systems: Negative except as noted in the HPI.  Objective:   [name] is a pleasant 51 y.o. female in NAD. AAO X 3.  Vascular Examination: CFT <3 seconds b/l LE. Palpable DP pulse(s) b/l LE. Palpable PT pulse(s) b/l LE. Pedal hair absent. No pain with calf compression b/l. Lower extremity skin temperature gradient within normal limits. No edema noted b/l LE. No ischemia or gangrene noted b/l LE. No cyanosis or clubbing noted b/l LE.  Dermatological Examination: Pedal skin is warm and supple b/l LE. No open wounds b/l LE. No interdigital macerations noted b/l LE. Toenails 1-5 left, 2-5 right elongated, discolored, dystrophic, thickened, and crumbly with subungual debris and tenderness to dorsal palpation. Hyperkeratotic lesion(s) dorsal PIPJ of bilateral 5th toes and 5th met base b/l lower extremities.  No erythema, no edema, no drainage, no fluctuance.  Neurological Examination: Pt has subjective symptoms of neuropathy. Protective sensation intact 5/5 intact bilaterally with 10g  monofilament b/l. Vibratory sensation intact b/l.  Musculoskeletal Examination: Normal muscle strength 5/5 to all lower extremity muscle groups bilaterally. Lower extremity amputation(s): partial amputation of R hallux. Hammertoe(s) noted to the 2-5 bilaterally. No pain, crepitus or joint limitation noted with ROM b/l LE.  Patient ambulates independently without assistive aids.     Latest Ref Rng & Units 01/13/2022    9:42 AM 07/20/2021    3:25 PM 05/27/2021   11:37 PM  Hemoglobin A1C  Hemoglobin-A1c 4.8 - 5.6 % 9.6  8.6  8.7    Assessment/Plan: 1. Pain due to onychomycosis of toenails of both feet   2. Status post amputation of great toe, right ([place])   3. Type 2 diabetes mellitus with diabetic polyneuropathy, with long-term current use of insulin ([place])      -Patient was evaluated and treated. All patient's and/or POA's questions/concerns answered on today's visit. -Discussed consequences of untreated corns/calluses in presence of diabetes which could result in diabetic ulcers. Patient refuses services on today as finances are a concern for her. -Medicaid ABN signed for this year. Patient refuses services of paring of corns/calluses today. Copy has been placed in patient chart. -Patient to continue soft, supportive shoe gear daily. -Mycotic toenails 1-5 bilaterally were debrided in length and girth with sterile nail nippers and dremel without incident. -Patient/POA to call should there be question/concern in the interim.   Return in about 3 months (around 06/01/2022).  Marzetta Board, DPM

## 2022-03-08 ENCOUNTER — Ambulatory Visit: Payer: Medicaid Other | Admitting: Student

## 2022-03-30 ENCOUNTER — Encounter: Payer: Self-pay | Admitting: "Endocrinology

## 2022-03-30 ENCOUNTER — Ambulatory Visit: Payer: Medicaid Other | Admitting: "Endocrinology

## 2022-03-30 VITALS — BP 158/104 | HR 80 | Ht 69.0 in | Wt 242.6 lb

## 2022-03-30 DIAGNOSIS — E1122 Type 2 diabetes mellitus with diabetic chronic kidney disease: Secondary | ICD-10-CM | POA: Diagnosis not present

## 2022-03-30 DIAGNOSIS — E782 Mixed hyperlipidemia: Secondary | ICD-10-CM

## 2022-03-30 DIAGNOSIS — N184 Chronic kidney disease, stage 4 (severe): Secondary | ICD-10-CM

## 2022-03-30 DIAGNOSIS — Z794 Long term (current) use of insulin: Secondary | ICD-10-CM

## 2022-03-30 DIAGNOSIS — I10 Essential (primary) hypertension: Secondary | ICD-10-CM

## 2022-03-30 DIAGNOSIS — Z6835 Body mass index (BMI) 35.0-35.9, adult: Secondary | ICD-10-CM

## 2022-03-30 NOTE — Patient Instructions (Signed)
                                     Advice for Weight Management  -For most of us the best way to lose weight is by diet management. Generally speaking, diet management means consuming less calories intentionally which over time brings about progressive weight loss.  This can be achieved more effectively by avoiding ultra processed carbohydrates, processed meats, unhealthy fats.    It is critically important to know your numbers: how much calorie you are consuming and how much calorie you need. More importantly, our carbohydrates sources should be unprocessed naturally occurring  complex starch food items.  It is always important to balance nutrition also by  appropriate intake of proteins (mainly plant-based), healthy fats/oils, plenty of fruits and vegetables.   -The American College of Lifestyle Medicine (ACL M) recommends nutrition derived mostly from Whole Food, Plant Predominant Sources example an apple instead of applesauce or apple pie. Eat Plenty of vegetables, Mushrooms, fruits, Legumes, Whole Grains, Nuts, seeds in lieu of processed meats, processed snacks/pastries red meat, poultry, eggs.  Use only water or unsweetened tea for hydration.  The College also recommends the need to stay away from risky substances including alcohol, smoking; obtaining 7-9 hours of restorative sleep, at least 150 minutes of moderate intensity exercise weekly, importance of healthy social connections, and being mindful of stress and seek help when it is overwhelming.    -Sticking to a routine mealtime to eat 3 meals a day and avoiding unnecessary snacks is shown to have a big role in weight control. Under normal circumstances, the only time we burn stored energy is when we are hungry, so allow  some hunger to take place- hunger means no food between appropriate meal times, only water.  It is not advisable to starve.   -It is better to avoid simple carbohydrates including:  Cakes, Sweet Desserts, Ice Cream, Soda (diet and regular), Sweet Tea, Candies, Chips, Cookies, Store Bought Juices, Alcohol in Excess of  1-2 drinks a day, Lemonade,  Artificial Sweeteners, Doughnuts, Coffee Creamers, "Sugar-free" Products, etc, etc.  This is not a complete list.....    -Consulting with certified diabetes educators is proven to provide you with the most accurate and current information on diet.  Also, you may be  interested in discussing diet options/exchanges , we can schedule a visit with [name], RDN, CDE for individualized nutrition education.  -Exercise: If you are able: 30 -60 minutes a day ,4 days a week, or 150 minutes of moderate intensity exercise weekly.    The longer the better if tolerated.  Combine stretch, strength, and aerobic activities.  If you were told in the past that you have high risk for cardiovascular diseases, or if you are currently symptomatic, you may seek evaluation by your heart doctor prior to initiating moderate to intense exercise programs.                                  Additional Care Considerations for Diabetes/Prediabetes   -Diabetes  is a chronic disease.  The most important care consideration is regular follow-up with your diabetes care provider with the goal being avoiding or delaying its complications and to take advantage of advances in medications and technology.  If appropriate actions are taken early enough, type 2 diabetes can even be   reversed.  Seek information from the right source.  - Whole Food, Plant Predominant Nutrition is highly recommended: Eat Plenty of vegetables, Mushrooms, fruits, Legumes, Whole Grains, Nuts, seeds in lieu of processed meats, processed snacks/pastries red meat, poultry, eggs as recommended by American College of  Lifestyle Medicine (ACLM).  -Type 2 diabetes is known to coexist with other important comorbidities such as high blood pressure and high cholesterol.  It is critical to control not only the  diabetes but also the high blood pressure and high cholesterol to minimize and delay the risk of complications including coronary artery disease, stroke, amputations, blindness, etc.  The good news is that this diet recommendation for type 2 diabetes is also very helpful for managing high cholesterol and high blood blood pressure.  - Studies showed that people with diabetes will benefit from a class of medications known as ACE inhibitors and statins.  Unless there are specific reasons not to be on these medications, the standard of care is to consider getting one from these groups of medications at an optimal doses.  These medications are generally considered safe and proven to help protect the heart and the kidneys.    - People with diabetes are encouraged to initiate and maintain regular follow-up with eye doctors, foot doctors, dentists , and if necessary heart and kidney doctors.     - It is highly recommended that people with diabetes quit smoking or stay away from smoking, and get yearly  flu vaccine and pneumonia vaccine at least every 5 years.  See above for additional recommendations on exercise, sleep, stress management , and healthy social connections.      

## 2022-03-30 NOTE — Progress Notes (Signed)
Endocrinology Consult Note       03/30/2022, 10:14 AM   Subjective:    Patient ID: [name], female    DOB: [birth year].  [name] is being seen in consultation for management of currently uncontrolled symptomatic diabetes requested by  [name], MD.   Past Medical History:  Diagnosis Date   Arthritis    Asthma    Cataract    Mixed form OU   CKD (chronic kidney disease)    Coronary artery disease    Diabetes mellitus    Diabetic retinopathy ([place])    NPDR OU   Hyperlipidemia    Hypertension    Hypertensive retinopathy    OU   Left thyroid nodule    diagnosed 07/2018   PAC (premature atrial contraction) 02/15/2021   Pseudotumor cerebri    Stroke [facility])    Vitamin D deficiency     Past Surgical History:  Procedure Laterality Date   ACHILLES TENDON REPAIR Right    ACHILLES TENDON SURGERY Left 02/19/2021   Procedure: ACHILLES TENDON REPAIR WITH GRAFT;  Surgeon: Felipa Furnace, DPM;  Location: [place];  Service: Podiatry;  Laterality: Left;  BLOCK   AMPUTATION TOE Right 05/28/2021   Procedure: AMPUTATION TOE;  Surgeon: Lorenda Peck, MD;  Location: [facility];  Service: Podiatry;  Laterality: Right;  Surgical team will do block   GASTROC RECESSION EXTREMITY Left 02/19/2021   Procedure: GASTROC RECESSION EXTREMITY;  Surgeon: Felipa Furnace, DPM;  Location: [place];  Service: Podiatry;  Laterality: Left;  Block   TUBAL LIGATION      Social History   Socioeconomic History   Marital status: Single    Spouse name: Not on file   Number of children: 4   Years of education: Not on file   Highest education level: Not on file  Occupational History   Occupation: unemployed  Tobacco Use   Smoking status: Former    Packs/day: 0.50    Years: 30.00    Total pack years: 15.00    Types: Cigarettes    Quit date: 2017    Years since quitting:  6.6   Smokeless tobacco: Never  Vaping Use   Vaping Use: Never used  Substance and Sexual Activity   Alcohol use: Not Currently    Alcohol/week: 0.0 standard drinks of alcohol    Comment: rare   Drug use: Yes    Types: Marijuana    Comment: daily   Sexual activity: Not Currently    Partners: Male    Birth control/protection: Surgical  Other Topics Concern   Not on file  Social History Narrative   Lives with 3 kids   Right handed   Drinks 2 cups caffeine daily   Social Determinants of Health   Financial Resource Strain: Not on file  Food Insecurity: Food Insecurity Present (02/15/2021)   Hunger Vital Sign    Worried About Running Out of Food in the Last Year: Sometimes true    Ran Out of Food in the Last Year: Never true  Transportation Needs: No Transportation Needs (02/15/2021)   [place] - Transportation  Lack of Transportation (Medical): No    Lack of Transportation (Non-Medical): No  Physical Activity: Inactive (02/15/2021)   Exercise Vital Sign    Days of Exercise per Week: 0 days    Minutes of Exercise per Session: 0 min  Stress: Stress Concern Present (02/15/2021)   [place]    Feeling of Stress : Very much  Social Connections: Moderately Isolated (02/15/2021)   Social Connection and Isolation Panel [NHANES]    Frequency of Communication with Friends and Family: Three times a week    Frequency of Social Gatherings with Friends and Family: Three times a week    Attends Religious Services: More than 4 times per year    Active Member of Clubs or Organizations: No    Attends Archivist Meetings: Never    Marital Status: Never married    Family History  Problem Relation Age of Onset   Asthma Mother    Diabetes Father    Hypertension Father    Kidney disease Father    Heart attack Father    Heart failure Father    Hypertension Sister    Asthma Sister    Congestive Heart Failure  Maternal Aunt    Diabetes Maternal Grandmother    Congestive Heart Failure Maternal Grandmother    Lung disease Maternal Grandmother    Asthma Other    Hyperlipidemia Other    Hypertension Other    Cancer Other     Outpatient Encounter Medications as of 03/30/2022  Medication Sig   glipiZIDE (GLUCOTROL XL) 5 MG 24 hr tablet Take 5 mg by mouth daily with breakfast.   insulin aspart protamine - aspart (NOVOLOG 70/30 MIX) (70-30) 100 UNIT/ML FlexPen Inject 30 Units into the skin 2 (two) times daily with a meal.   Accu-Chek FastClix Lancets MISC 4 (four) times daily.   ACCU-CHEK GUIDE test strip 1 each by Other route See admin instructions. for testing   albuterol (VENTOLIN HFA) 108 (90 Base) MCG/ACT inhaler Inhale 1-2 puffs into the lungs every 6 (six) hours as needed for wheezing or shortness of breath.   amLODipine-benazepril (LOTREL) 10-40 MG capsule Take 1 capsule by mouth daily.   aspirin 81 MG EC tablet Take 1 tablet (81 mg total) by mouth daily.   blood glucose meter kit and supplies KIT Dispense based on patient and insurance preference. Use up to four times daily as directed. (FOR ICD-9 250.00, 250.01).   busPIRone (BUSPAR) 5 MG tablet Take 5 mg by mouth 2 (two) times daily.   cloNIDine (CATAPRES - DOSED IN MG/24 HR) 0.3 mg/24hr patch Place 1 patch (0.3 mg total) onto the skin once a week.   EPINEPHrine 0.3 mg/0.3 mL IJ SOAJ injection Inject 0.3 mg into the muscle as needed for anaphylaxis.   fenofibrate (TRICOR) 48 MG tablet Take 48 mg by mouth daily.   fluticasone (FLONASE) 50 MCG/ACT nasal spray Place 2 sprays into both nostrils daily as needed for allergies or rhinitis.   furosemide (LASIX) 40 MG tablet Take 1 tablet (40 mg total) by mouth daily.   hydrOXYzine (VISTARIL) 25 MG capsule Take 25 mg by mouth at bedtime.   Insulin Syringes, Disposable, S-010 1 ML MISC 1 application by Does not apply route 2 (two) times a day.   ipratropium-albuterol (DUONEB) 0.5-2.5 (3) MG/3ML SOLN  Take 3 mLs by nebulization every 4 (four) hours as needed. (Patient not taking: Reported on 03/30/2022)   isosorbide-hydrALAZINE (BIDIL) 20-37.5 MG tablet Take  2 tablets by mouth 3 (three) times daily.   labetalol (NORMODYNE) 300 MG tablet Take 300 mg by mouth 2 (two) times daily.   methocarbamol (ROBAXIN) 750 MG tablet Take 1 tablet (750 mg total) by mouth every 8 (eight) hours as needed (muscle spasm/pain). (Patient not taking: Reported on 03/30/2022)   rosuvastatin (CRESTOR) 40 MG tablet Take 40 mg by mouth daily.   [DISCONTINUED] glipiZIDE (GLUCOTROL) 10 MG tablet Take 10 mg by mouth daily before breakfast.   [DISCONTINUED] insulin aspart protamine- aspart (NOVOLOG MIX 70/30) (70-30) 100 UNIT/ML injection Inject 0.15 mLs (15 Units total) into the skin 2 (two) times daily with a meal. (Patient taking differently: Inject 30 Units into the skin 2 (two) times daily with a meal.)   No facility-administered encounter medications on file as of 03/30/2022.    ALLERGIES: Allergies  Allergen Reactions   Bee Pollen Other (See Comments)    Seasonal Allergies   Dust Mite Extract Cough    Sneezing & Cough   Mixed Ragweed Itching    VACCINATION STATUS: Immunization History  Administered Date(s) Administered   Influenza Split 04/23/2013    Diabetes She presents for her initial diabetic visit. She has type 2 diabetes mellitus. Onset time: She was diagnosed at approximate age of 42 years. Her disease course has been worsening. There are no hypoglycemic associated symptoms. Pertinent negatives for hypoglycemia include no confusion, headaches, pallor or seizures. Associated symptoms include blurred vision, fatigue, polydipsia and polyuria. Pertinent negatives for diabetes include no chest pain and no polyphagia. There are no hypoglycemic complications. Symptoms are worsening. Diabetic complications include a CVA, heart disease, nephropathy and retinopathy. Risk factors for coronary artery disease include  diabetes mellitus, dyslipidemia, family history, hypertension, obesity, post-menopausal, sedentary lifestyle and tobacco exposure. Current diabetic treatment includes insulin injections and oral agent (monotherapy). Her weight is fluctuating minimally. She is following a generally unhealthy diet. When asked about meal planning, she reported none. She has had a previous visit with a dietitian. She never participates in exercise. Her home blood glucose trend is increasing steadily. Her overall blood glucose range is 140-180 mg/dl. (She brings in a meter showing inadequate monitoring of blood glucose.  She has 16 readings in the last 30 days averaging 161.  Her most recent A1c was 9.6%.  She denies any hypoglycemia.  ) An ACE inhibitor/angiotensin II receptor blocker is being taken. Eye exam is current.  Hypertension This is a chronic problem. The current episode started more than 1 year ago. The problem is uncontrolled. Associated symptoms include blurred vision. Pertinent negatives include no chest pain, headaches, palpitations or shortness of breath. Risk factors for coronary artery disease include family history, dyslipidemia, diabetes mellitus, obesity, sedentary lifestyle, smoking/tobacco exposure and post-menopausal state. Past treatments include beta blockers, direct vasodilators, calcium channel blockers and ACE inhibitors. The current treatment provides no improvement. Hypertensive end-organ damage includes kidney disease, CAD/MI, CVA and retinopathy.  Hyperlipidemia This is a chronic problem. The current episode started more than 1 year ago. The problem is uncontrolled. Exacerbating diseases include diabetes and obesity. Pertinent negatives include no chest pain, myalgias or shortness of breath. Risk factors for coronary artery disease include diabetes mellitus, dyslipidemia, hypertension, family history, post-menopausal, a sedentary lifestyle and obesity.     Review of Systems  Constitutional:   Positive for fatigue. Negative for chills, fever and unexpected weight change.  HENT:  Negative for trouble swallowing and voice change.   Eyes:  Positive for blurred vision. Negative for visual disturbance.  Respiratory:  Negative for cough, shortness of breath and wheezing.   Cardiovascular:  Negative for chest pain, palpitations and leg swelling.  Gastrointestinal:  Negative for diarrhea, nausea and vomiting.  Endocrine: Positive for polydipsia and polyuria. Negative for cold intolerance, heat intolerance and polyphagia.  Musculoskeletal:  Negative for arthralgias and myalgias.  Skin:  Negative for color change, pallor, rash and wound.  Neurological:  Negative for seizures and headaches.  Psychiatric/Behavioral:  Negative for confusion and suicidal ideas.     Objective:       03/30/2022    9:09 AM 02/07/2022    3:37 PM 01/25/2022   11:15 AM  Vitals with BMI  Height _0  _1  _2   Weight 242 lbs 10 oz 243 lbs 230 lbs  BMI 35.81 67.12 45.80  Systolic 998  338  Diastolic 250  89  Pulse 80  76    BP (!) 158/104   Pulse 80   Ht _3  (1.753 m)   Wt 242 lb 9.6 oz (110 kg)   BMI 35.83 kg/m   Wt Readings from Last 3 Encounters:  03/30/22 242 lb 9.6 oz (110 kg)  02/07/22 243 lb (110.2 kg)  01/25/22 230 lb (104.3 kg)     Physical Exam Constitutional:      Appearance: She is well-developed.  HENT:     Head: Normocephalic and atraumatic.  Neck:     Thyroid: No thyromegaly.     Trachea: No tracheal deviation.  Cardiovascular:     Rate and Rhythm: Normal rate and regular rhythm.  Pulmonary:     Effort: Pulmonary effort is normal.     Breath sounds: Normal breath sounds.  Abdominal:     General: Bowel sounds are normal.     Palpations: Abdomen is soft.     Tenderness: There is no abdominal tenderness. There is no guarding.  Musculoskeletal:        General: Normal range of motion.     Cervical back: Normal range of motion and neck supple.  Skin:    General: Skin is  warm and dry.     Coloration: Skin is not pale.     Findings: No erythema or rash.  Neurological:     Mental Status: She is alert and oriented to person, place, and time.     Cranial Nerves: No cranial nerve deficit.     Coordination: Coordination normal.     Deep Tendon Reflexes: Reflexes are normal and symmetric.  Psychiatric:        Judgment: Judgment normal.     Comments: Hesitant affect.       CMP ( most recent) CMP     Component Value Date/Time   NA 137 01/13/2022 0942   NA 140 07/27/2021 1242   K 4.1 01/13/2022 0942   CL 107 01/13/2022 0942   CO2 22 01/13/2022 0942   GLUCOSE 188 (H) 01/13/2022 0942   BUN 21 (H) 01/13/2022 0942   BUN 21 07/27/2021 1242   CREATININE 2.92 (H) 01/13/2022 0942   CALCIUM 8.0 (L) 01/13/2022 0942   PROT 6.9 01/13/2022 0942   PROT 7.2 03/30/2020 0900   ALBUMIN 3.3 (L) 01/13/2022 0942   ALBUMIN 4.0 03/30/2020 0900   AST 11 (L) 01/13/2022 0942   ALT 10 01/13/2022 0942   ALKPHOS 55 01/13/2022 0942   BILITOT 0.3 01/13/2022 0942   BILITOT 0.2 03/30/2020 0900   GFRNONAA 19 (L) 01/13/2022 0942   GFRAA 51 (L) 03/30/2020 1059     Diabetic Labs (  most recent): Lab Results  Component Value Date   HGBA1C 9.6 (H) 01/13/2022   HGBA1C 8.6 (H) 07/20/2021   HGBA1C 8.7 (H) 05/27/2021     Lipid Panel ( most recent) Lipid Panel     Component Value Date/Time   CHOL 88 (L) 07/20/2021 1525   TRIG 153 (H) 07/20/2021 1525   HDL 33 (L) 07/20/2021 1525   CHOLHDL 2.7 07/20/2021 1525   CHOLHDL 4.9 02/12/2019 0537   VLDL 77 (H) 02/12/2019 0537   LDLCALC 29 07/20/2021 1525   LABVLDL 26 07/20/2021 1525      Lab Results  Component Value Date   TSH 1.470 03/30/2020   TSH 1.380 07/31/2019       Assessment & Plan:   1. Type 2 diabetes mellitus with stage 4 chronic kidney disease, with long-term current use of insulin ([place])  - [name] has currently uncontrolled symptomatic type 2 DM since  51 years of age,  with most recent A1c of 9.6  %. Recent labs reviewed. - I had a long discussion with her about the possible risk factors and  the pathology behind its diabetes and its complications. -her diabetes is complicated by CVA, CHF, coronary artery disease, retinopathy, nephropathy, obesity/sedentary life, polypharmacy and she remains at exceedingly high risk for more acute and chronic complications which include CAD, CVA, CKD, retinopathy, and neuropathy. These are all discussed in detail with her.  - I discussed all available options of managing her diabetes including de-escalation of medications. I have counseled her on Food as Medicine by adopting a Whole Food , Plant Predominant  ( WFPP) nutrition as recommended by [employer] of Lifestyle Medicine. Patient is encouraged to switch to  unprocessed or minimally processed  complex starch, adequate protein intake (mainly plant source), minimal liquid fat, plenty of fruits, and vegetables. -  she is advised to stick to a routine mealtimes to eat 3 complete meals a day and snack only when necessary ( to snack only to correct hypoglycemia BG <70 day time or <100 at night).   - she acknowledges that there is a room for improvement in her food and drink choices. - Further Specific Suggestion is made for her to avoid simple carbohydrates  from her diet including Cakes, Sweet Desserts, Ice Cream, Soda (diet and regular), Sweet Tea, Candies, Chips, Cookies, Store Bought Juices, Alcohol ,  Artificial Sweeteners,  Coffee Creamer, and "Sugar-free" Products. This will help patient to have more stable blood glucose profile and potentially avoid unintended weight gain.  She will be approached for more detailed lifestyle medicine during her next visit.  - she has an appointment with a nutritionist in [place], encouraged to keep that appointment.     - I have approached her with the following individualized plan to manage  her diabetes and patient agrees:   In light of her presentation with  chronic hyperglycemic burden,  she will continue to need multiple daily injections of insulin in order for her to achieve and maintain control of diabetes to target.    -For simplicity reasons, she is advised to remain on her premixed insulin twice a day.  I discussed and advised her to continue NovoLog 70/30 30 units with breakfast and 30 units with supper for pre-meal blood glucose readings above 90 mg per DL. She is urged to start monitoring blood glucose 4 times a day-before meals and at bedtime. She would benefit from a CGM.  I will prescribe the Dexcom device for her next visit. -  she is warned not to take insulin without proper monitoring per orders.  - she is encouraged to call clinic for blood glucose levels less than 70 or above 200 mg /dl. - she is advised to lower her glipizide to 5 mg once a day at breakfast.    - she will be considered for either low-dose SGLT2 inhibitors or incretin therapy as appropriate next visit.  - Specific targets for  A1c;  LDL, HDL,  and Triglycerides were discussed with the patient.  2) Blood Pressure /Hypertension:  her blood pressure is not controlled to target.  This is due to the fact that she did not take her blood pressure medications today and has not been consistent taking them in the last several weeks.  She has amlodipine/benazepril 10/40 mg p.o. daily, clonidine 0.3 mg patch weekly, Lasix 40 mg daily, isosorbide-hydralazine 28-37.5 mg 3 times daily, labetalol 300 mg p.o. twice a day.  This is an adequate and probably more than adequate prescription for her hypertension.  There is a questionable compliance with medications in this patient. She is advised to take her blood pressure medications as soon as she gets home.    3) Lipids/Hyperlipidemia:   Review of her recent lipid panel showed  controlled  LDL at 29 .  she  is advised to continue    Crestor 40 mg p.o. daily at bedtime.    Side effects and precautions discussed with her.  4)   Weight/Diet:  Body mass index is 35.83 kg/m.  -   clearly complicating her diabetes care.   she is  a candidate for weight loss. I discussed with her the fact that loss of 5 - 10% of her  current body weight will have the most impact on her diabetes management.  The above detailed  ACLM recommendations for nutrition, exercise, sleep, social life, avoidance of risky substances, the need for restorative sleep   information will also detailed on discharge instructions.  5) Chronic Care/Health Maintenance:  -she  is on ACEI/ARB and Statin medications and  is encouraged to initiate and continue to follow up with Ophthalmology, Dentist, nephrology, podiatrist at least yearly or according to recommendations, and advised to  quit tobacco products/smoking. I have recommended yearly flu vaccine and pneumonia vaccine at least every 5 years; moderate intensity exercise for up to 150 minutes weekly; and  sleep for 7- 9 hours a day.  - she is  advised to maintain close follow up with [name], MD for primary care needs, as well as her other providers for optimal and coordinated care.   I spent 66 minutes in the care of the patient today including review of labs from [place], Lipids, Thyroid Function, Hematology (current and previous including abstractions from other facilities); face-to-face time discussing  her blood glucose readings/logs, discussing hypoglycemia and hyperglycemia episodes and symptoms, medications doses, her options of short and long term treatment based on the latest standards of care / guidelines;  discussion about incorporating lifestyle medicine;  and documenting the encounter. Risk reduction counseling performed per USPSTF guidelines to reduce  obesity and cardiovascular risk factors.      Please refer to Patient Instructions for Blood Glucose Monitoring and Insulin/Medications Dosing Guide"  in media tab for additional information. Please  also refer to " Patient Self Inventory" in the  Media  tab for reviewed elements of pertinent patient history.  [name] participated in the discussions, expressed understanding, and voiced agreement with the above plans.  All questions  were answered to her satisfaction. she is encouraged to contact clinic should she have any questions or concerns prior to her return visit.   Follow up plan: - Return in about 10 days (around 04/09/2022) for F/U with Meter/CGM [facility] Only - no Labs.  Glade Lloyd, MD [facility] [address] [place], [place] [address] Phone: [phone]  Fax: [phone]    03/30/2022, 10:14 AM  This note was partially dictated with voice recognition software. Similar sounding words can be transcribed inadequately or may not  be corrected upon review.

## 2022-04-13 ENCOUNTER — Ambulatory Visit: Payer: Medicaid Other | Admitting: "Endocrinology

## 2022-04-13 ENCOUNTER — Encounter: Payer: Self-pay | Admitting: "Endocrinology

## 2022-04-13 VITALS — BP 156/104 | HR 72 | Ht 69.0 in | Wt 240.6 lb

## 2022-04-13 DIAGNOSIS — Z794 Long term (current) use of insulin: Secondary | ICD-10-CM

## 2022-04-13 DIAGNOSIS — E782 Mixed hyperlipidemia: Secondary | ICD-10-CM

## 2022-04-13 DIAGNOSIS — Z6835 Body mass index (BMI) 35.0-35.9, adult: Secondary | ICD-10-CM

## 2022-04-13 DIAGNOSIS — E1122 Type 2 diabetes mellitus with diabetic chronic kidney disease: Secondary | ICD-10-CM

## 2022-04-13 DIAGNOSIS — I10 Essential (primary) hypertension: Secondary | ICD-10-CM

## 2022-04-13 DIAGNOSIS — N184 Chronic kidney disease, stage 4 (severe): Secondary | ICD-10-CM

## 2022-04-13 MED ORDER — DEXCOM G7 RECEIVER DEVI: 0 refills | Status: DC

## 2022-04-13 MED ORDER — DEXCOM G7 SENSOR MISC: 2 refills | Status: DC

## 2022-04-13 NOTE — Patient Instructions (Signed)

## 2022-04-13 NOTE — Progress Notes (Signed)
04/13/2022, 9:38 AM  Endocrinology follow-up note   Subjective:    Patient ID: [name], female    DOB: 1971-02-04.  [name] is being seen in follow-up after she was seen in consultation for management of currently uncontrolled symptomatic diabetes requested by  [name], MD.   Past Medical History:  Diagnosis Date   Arthritis    Asthma    Cataract    Mixed form OU   CKD (chronic kidney disease)    Coronary artery disease    Diabetes mellitus    Diabetic retinopathy ([place])    NPDR OU   Hyperlipidemia    Hypertension    Hypertensive retinopathy    OU   Left thyroid nodule    diagnosed 07/2018   PAC (premature atrial contraction) 02/15/2021   Pseudotumor cerebri    Stroke [facility])    Vitamin D deficiency     Past Surgical History:  Procedure Laterality Date   ACHILLES TENDON REPAIR Right    ACHILLES TENDON SURGERY Left 02/19/2021   Procedure: ACHILLES TENDON REPAIR WITH GRAFT;  Surgeon: Felipa Furnace, DPM;  Location: [place];  Service: Podiatry;  Laterality: Left;  BLOCK   AMPUTATION TOE Right 05/28/2021   Procedure: AMPUTATION TOE;  Surgeon: Lorenda Peck, MD;  Location: [place];  Service: Podiatry;  Laterality: Right;  Surgical team will do block   GASTROC RECESSION EXTREMITY Left 02/19/2021   Procedure: GASTROC RECESSION EXTREMITY;  Surgeon: Felipa Furnace, DPM;  Location: [place];  Service: Podiatry;  Laterality: Left;  Block   TUBAL LIGATION      Social History   Socioeconomic History   Marital status: Single    Spouse name: Not on file   Number of children: 4   Years of education: Not on file   Highest education level: Not on file  Occupational History   Occupation: unemployed  Tobacco Use   Smoking status: Former    Packs/day: 0.50    Years: 30.00    Total pack years: 15.00    Types: Cigarettes    Quit date: 2017    Years since quitting: 6.6   Smokeless tobacco:  Never  Vaping Use   Vaping Use: Never used  Substance and Sexual Activity   Alcohol use: Not Currently    Alcohol/week: 0.0 standard drinks of alcohol    Comment: rare   Drug use: Yes    Types: Marijuana    Comment: daily   Sexual activity: Not Currently    Partners: Male    Birth control/protection: Surgical  Other Topics Concern   Not on file  Social History Narrative   Lives with 3 kids   Right handed   Drinks 2 cups caffeine daily   Social Determinants of Health   Financial Resource Strain: Not on file  Food Insecurity: Food Insecurity Present (02/15/2021)   Hunger Vital Sign    Worried About Running Out of Food in the Last Year: Sometimes true    Ran Out of Food in the Last Year: Never true  Transportation Needs: No Transportation Needs (02/15/2021)   PRAPARE - Hydrologist (Medical): No    Lack of Transportation (Non-Medical): No  Physical Activity: Inactive (02/15/2021)   Exercise Vital Sign    Days of Exercise per Week: 0 days    Minutes of Exercise per Session: 0 min  Stress: Stress Concern Present (02/15/2021)   [place]  [employer] - Occupational Stress Questionnaire    Feeling of Stress : Very much  Social Connections: Moderately Isolated (02/15/2021)   Social Connection and Isolation Panel [NHANES]    Frequency of Communication with Friends and Family: Three times a week    Frequency of Social Gatherings with Friends and Family: Three times a week    Attends Religious Services: More than 4 times per year    Active Member of Clubs or Organizations: No    Attends Archivist Meetings: Never    Marital Status: Never married    Family History  Problem Relation Age of Onset   Asthma Mother    Diabetes Father    Hypertension Father    Kidney disease Father    Heart attack Father    Heart failure Father    Hypertension Sister    Asthma Sister    Congestive Heart Failure Maternal Aunt    Diabetes  Maternal Grandmother    Congestive Heart Failure Maternal Grandmother    Lung disease Maternal Grandmother    Asthma Other    Hyperlipidemia Other    Hypertension Other    Cancer Other     Outpatient Encounter Medications as of 04/13/2022  Medication Sig   Continuous Blood Gluc Receiver (DEXCOM G7 RECEIVER) DEVI Use to monitor BG continuously   Continuous Blood Gluc Sensor (DEXCOM G7 SENSOR) MISC Change sensor every 10 days   Accu-Chek FastClix Lancets MISC 4 (four) times daily.   ACCU-CHEK GUIDE test strip 1 each by Other route See admin instructions. for testing   albuterol (VENTOLIN HFA) 108 (90 Base) MCG/ACT inhaler Inhale 1-2 puffs into the lungs every 6 (six) hours as needed for wheezing or shortness of breath.   amLODipine-benazepril (LOTREL) 10-40 MG capsule Take 1 capsule by mouth daily.   aspirin 81 MG EC tablet Take 1 tablet (81 mg total) by mouth daily.   blood glucose meter kit and supplies KIT Dispense based on patient and insurance preference. Use up to four times daily as directed. (FOR ICD-9 250.00, 250.01).   busPIRone (BUSPAR) 5 MG tablet Take 5 mg by mouth 2 (two) times daily.   cloNIDine (CATAPRES - DOSED IN MG/24 HR) 0.3 mg/24hr patch Place 1 patch (0.3 mg total) onto the skin once a week.   EPINEPHrine 0.3 mg/0.3 mL IJ SOAJ injection Inject 0.3 mg into the muscle as needed for anaphylaxis.   fenofibrate (TRICOR) 48 MG tablet Take 48 mg by mouth daily.   fluticasone (FLONASE) 50 MCG/ACT nasal spray Place 2 sprays into both nostrils daily as needed for allergies or rhinitis.   furosemide (LASIX) 40 MG tablet Take 1 tablet (40 mg total) by mouth daily.   glipiZIDE (GLUCOTROL XL) 5 MG 24 hr tablet Take 5 mg by mouth daily with breakfast.   hydrOXYzine (VISTARIL) 25 MG capsule Take 25 mg by mouth at bedtime.   insulin aspart protamine - aspart (NOVOLOG 70/30 MIX) (70-30) 100 UNIT/ML FlexPen Inject 20 Units into the skin 2 (two) times daily with a meal.   Insulin Syringes,  Disposable, S-341 1 ML MISC 1 application by Does not apply route 2 (two) times a day.   ipratropium-albuterol (DUONEB) 0.5-2.5 (3) MG/3ML SOLN Take 3 mLs by nebulization every 4 (four) hours as needed. (Patient not taking: Reported on 03/30/2022)   isosorbide-hydrALAZINE (BIDIL) 20-37.5 MG tablet Take 2 tablets by mouth 3 (three) times daily.   labetalol (NORMODYNE) 300 MG tablet Take 300 mg by mouth 2 (two)  times daily.   methocarbamol (ROBAXIN) 750 MG tablet Take 1 tablet (750 mg total) by mouth every 8 (eight) hours as needed (muscle spasm/pain). (Patient not taking: Reported on 03/30/2022)   rosuvastatin (CRESTOR) 40 MG tablet Take 40 mg by mouth daily.   No facility-administered encounter medications on file as of 04/13/2022.    ALLERGIES: Allergies  Allergen Reactions   Bee Pollen Other (See Comments)    Seasonal Allergies   Dust Mite Extract Cough    Sneezing & Cough   Mixed Ragweed Itching    VACCINATION STATUS: Immunization History  Administered Date(s) Administered   Influenza Split 04/23/2013    Diabetes She presents for her follow-up diabetic visit. She has type 2 diabetes mellitus. Onset time: She was diagnosed at approximate age of 73 years. Her disease course has been improving. There are no hypoglycemic associated symptoms. Pertinent negatives for hypoglycemia include no confusion, headaches, pallor or seizures. Associated symptoms include fatigue. Pertinent negatives for diabetes include no blurred vision, no chest pain, no polydipsia, no polyphagia and no polyuria. There are no hypoglycemic complications. Symptoms are improving. Diabetic complications include a CVA, heart disease, nephropathy and retinopathy. Risk factors for coronary artery disease include diabetes mellitus, dyslipidemia, family history, hypertension, obesity, post-menopausal, sedentary lifestyle and tobacco exposure. Current diabetic treatment includes insulin injections and oral agent (monotherapy). Her  weight is decreasing steadily. She is following a generally unhealthy diet. When asked about meal planning, she reported none. She has had a previous visit with a dietitian. She never participates in exercise. Her home blood glucose trend is increasing steadily. Her breakfast blood glucose range is generally 110-130 mg/dl. Her lunch blood glucose range is generally 130-140 mg/dl. Her dinner blood glucose range is generally 130-140 mg/dl. Her bedtime blood glucose range is generally 130-140 mg/dl. Her overall blood glucose range is 130-140 mg/dl. (She brings in her logs showing controlled glycemic profile to target despite her recent A1c of 9.6%.  She denies hypoglycemia.   ) An ACE inhibitor/angiotensin II receptor blocker is being taken. Eye exam is current.  Hypertension This is a chronic problem. The current episode started more than 1 year ago. The problem is uncontrolled. Pertinent negatives include no blurred vision, chest pain, headaches, palpitations or shortness of breath. Risk factors for coronary artery disease include family history, dyslipidemia, diabetes mellitus, obesity, sedentary lifestyle, smoking/tobacco exposure and post-menopausal state. Past treatments include beta blockers, direct vasodilators, calcium channel blockers and ACE inhibitors. The current treatment provides no improvement. Hypertensive end-organ damage includes kidney disease, CAD/MI, CVA and retinopathy.  Hyperlipidemia This is a chronic problem. The current episode started more than 1 year ago. The problem is uncontrolled. Exacerbating diseases include diabetes and obesity. Pertinent negatives include no chest pain, myalgias or shortness of breath. Risk factors for coronary artery disease include diabetes mellitus, dyslipidemia, hypertension, family history, post-menopausal, a sedentary lifestyle and obesity.     Review of Systems  Constitutional:  Positive for fatigue. Negative for chills, fever and unexpected weight  change.  HENT:  Negative for trouble swallowing and voice change.   Eyes:  Negative for blurred vision and visual disturbance.  Respiratory:  Negative for cough, shortness of breath and wheezing.   Cardiovascular:  Negative for chest pain, palpitations and leg swelling.  Gastrointestinal:  Negative for diarrhea, nausea and vomiting.  Endocrine: Negative for cold intolerance, heat intolerance, polydipsia, polyphagia and polyuria.  Musculoskeletal:  Negative for arthralgias and myalgias.  Skin:  Negative for color change, pallor, rash and wound.  Neurological:  Negative for seizures and headaches.  Psychiatric/Behavioral:  Negative for confusion and suicidal ideas.     Objective:       04/13/2022    9:05 AM 03/30/2022    9:09 AM 02/07/2022    3:37 PM  Vitals with BMI  Height 5' 9"  5' 9"  5' 9"   Weight 240 lbs 10 oz 242 lbs 10 oz 243 lbs  BMI 35.51 82.42 35.36  Systolic 144 315   Diastolic 400 867   Pulse 72 80     BP (!) 156/104   Pulse 72   Ht 5' 9"  (1.753 m)   Wt 240 lb 9.6 oz (109.1 kg)   BMI 35.53 kg/m   Wt Readings from Last 3 Encounters:  04/13/22 240 lb 9.6 oz (109.1 kg)  03/30/22 242 lb 9.6 oz (110 kg)  02/07/22 243 lb (110.2 kg)         CMP ( most recent) CMP     Component Value Date/Time   NA 137 01/13/2022 0942   NA 140 07/27/2021 1242   K 4.1 01/13/2022 0942   CL 107 01/13/2022 0942   CO2 22 01/13/2022 0942   GLUCOSE 188 (H) 01/13/2022 0942   BUN 21 (H) 01/13/2022 0942   BUN 21 07/27/2021 1242   CREATININE 2.92 (H) 01/13/2022 0942   CALCIUM 8.0 (L) 01/13/2022 0942   PROT 6.9 01/13/2022 0942   PROT 7.2 03/30/2020 0900   ALBUMIN 3.3 (L) 01/13/2022 0942   ALBUMIN 4.0 03/30/2020 0900   AST 11 (L) 01/13/2022 0942   ALT 10 01/13/2022 0942   ALKPHOS 55 01/13/2022 0942   BILITOT 0.3 01/13/2022 0942   BILITOT 0.2 03/30/2020 0900   GFRNONAA 19 (L) 01/13/2022 0942   GFRAA 51 (L) 03/30/2020 1059     Diabetic Labs (most recent): Lab Results  Component  Value Date   HGBA1C 9.6 (H) 01/13/2022   HGBA1C 8.6 (H) 07/20/2021   HGBA1C 8.7 (H) 05/27/2021     Lipid Panel ( most recent) Lipid Panel     Component Value Date/Time   CHOL 88 (L) 07/20/2021 1525   TRIG 153 (H) 07/20/2021 1525   HDL 33 (L) 07/20/2021 1525   CHOLHDL 2.7 07/20/2021 1525   CHOLHDL 4.9 02/12/2019 0537   VLDL 77 (H) 02/12/2019 0537   LDLCALC 29 07/20/2021 1525   LABVLDL 26 07/20/2021 1525      Lab Results  Component Value Date   TSH 1.470 03/30/2020   TSH 1.380 07/31/2019       Assessment & Plan:   1. Type 2 diabetes mellitus with stage 4 chronic kidney disease, with long-term current use of insulin ([place])  - [name] has currently uncontrolled symptomatic type 2 DM since  51 years of age.  She brings in her logs showing controlled glycemic profile to target despite her recent A1c of 9.6%.  She denies hypoglycemia.   Recent labs reviewed. - I had a long discussion with her about the possible risk factors and  the pathology behind its diabetes and its complications. -her diabetes is complicated by CVA, CHF, coronary artery disease, retinopathy, nephropathy, obesity/sedentary life, polypharmacy and she remains at exceedingly high risk for more acute and chronic complications which include CAD, CVA, CKD, retinopathy, and neuropathy. These are all discussed in detail with her.  - I discussed all available options of managing her diabetes including de-escalation of medications. I have counseled her on Food as Medicine by adopting a Whole Food , Plant Predominant  (  WFPP) nutrition as recommended by [place] of Lifestyle Medicine. Patient is encouraged to switch to  unprocessed or minimally processed  complex starch, adequate protein intake (mainly plant source), minimal liquid fat, plenty of fruits, and vegetables. -  she is advised to stick to a routine mealtimes to eat 3 complete meals a day and snack only when necessary ( to snack only to correct  hypoglycemia BG <70 day time or <100 at night).   -She has engaged and responding to lifestyle medicine. - she acknowledges that there is a room for improvement in her food and drink choices. - Suggestion is made for her to avoid simple carbohydrates  from her diet including Cakes, Sweet Desserts, Ice Cream, Soda (diet and regular), Sweet Tea, Candies, Chips, Cookies, Store Bought Juices, Alcohol , Artificial Sweeteners,  Coffee Creamer, and "Sugar-free" Products, Lemonade. This will help patient to have more stable blood glucose profile and potentially avoid unintended weight gain.  The following Lifestyle Medicine recommendations according to [place]  [facility]) were discussed and and offered to patient and she  agrees to start the journey:  A. Whole Foods, Plant-Based Nutrition comprising of fruits and vegetables, plant-based proteins, whole-grain carbohydrates was discussed in detail with the patient.   A list for source of those nutrients were also provided to the patient.  Patient will use only water or unsweetened tea for hydration. B.  The need to stay away from risky substances including alcohol, smoking; obtaining 7 to 9 hours of restorative sleep, at least 150 minutes of moderate intensity exercise weekly, the importance of healthy social connections,  and stress management techniques were discussed.   - she has an appointment with a nutritionist in [place], encouraged to keep that appointment.     - I have approached her with the following individualized plan to manage  her diabetes and patient agrees:   In light of her presentation with significantly improved glycemic profile, she will be considered for lower dose of insulin.    I discussed and lowered her NovoLog 70/30 to 20 units with breakfast and 20 units with supper   for pre-meal blood glucose readings above 90 mg per DL. She is urged to continue  monitoring blood glucose 4 times a day-before meals  and at bedtime. -She will benefit from a CGM device.  I discussed and prescribed the Dexcom device for her.   - she is warned not to take insulin without proper monitoring per orders.  - she is encouraged to call clinic for blood glucose levels less than 70 or above 200 mg /dl. - she is advised to continue glipizide 5 mg p.o. daily at breakfast.     - she will be considered for either low-dose SGLT2 inhibitors or incretin therapy as appropriate next visit.  - Specific targets for  A1c;  LDL, HDL,  and Triglycerides were discussed with the patient.  2) Blood Pressure /Hypertension:  her blood pressure is not controlled to target.  She still is hesitant to take her blood pressure medications in the morning.     She has amlodipine/benazepril 10/40 mg p.o. daily, clonidine 0.3 mg patch weekly, Lasix 40 mg daily, isosorbide-hydralazine 28-37.5 mg 3 times daily, labetalol 300 mg p.o. twice a day.  This is an adequate and probably more than adequate prescription for her hypertension.  There is a questionable compliance with medications in this patient. She is advised to take her blood pressure medications as soon as she gets home.  3) Lipids/Hyperlipidemia:   Review of her recent lipid panel showed  controlled  LDL at 29 .  she  is advised to continue Stalter 40 mg p.o. daily at bedtime.  Side effects and precautions discussed with her.  4)  Weight/Diet:  Body mass index is 35.53 kg/m.  -   clearly complicating her diabetes care.   she is  a candidate for weight loss. I discussed with her the fact that loss of 5 - 10% of her  current body weight will have the most impact on her diabetes management.  The above detailed  ACLM recommendations for nutrition, exercise, sleep, social life, avoidance of risky substances, the need for restorative sleep   information will also detailed on discharge instructions.  5) Chronic Care/Health Maintenance:  -she  is on ACEI/ARB and Statin medications and  is  encouraged to initiate and continue to follow up with Ophthalmology, Dentist, nephrology, podiatrist at least yearly or according to recommendations, and advised to  quit tobacco products/smoking. I have recommended yearly flu vaccine and pneumonia vaccine at least every 5 years; moderate intensity exercise for up to 150 minutes weekly; and  sleep for 7- 9 hours a day.  - she is  advised to maintain close follow up with [name], MD for primary care needs, as well as her other providers for optimal and coordinated care.  I spent 33 minutes in the care of the patient today including review of labs from [place], Lipids, Thyroid Function, Hematology (current and previous including abstractions from other facilities); face-to-face time discussing  her blood glucose readings/logs, discussing hypoglycemia and hyperglycemia episodes and symptoms, medications doses, her options of short and long term treatment based on the latest standards of care / guidelines;  discussion about incorporating lifestyle medicine;  and documenting the encounter. Risk reduction counseling performed per USPSTF guidelines to reduce  obesity and cardiovascular risk factors.     Please refer to Patient Instructions for Blood Glucose Monitoring and Insulin/Medications Dosing Guide"  in media tab for additional information. Please  also refer to " Patient Self Inventory" in the Media  tab for reviewed elements of pertinent patient history.  [name] participated in the discussions, expressed understanding, and voiced agreement with the above plans.  All questions were answered to her satisfaction. she is encouraged to contact clinic should she have any questions or concerns prior to her return visit.    Follow up plan: - Return in about 9 weeks (around 06/15/2022) for F/U with Pre-visit Labs, Meter/CGM/Logs, A1c here.  Glade Lloyd, MD [facility] [address] [place], [place] [address] Phone: [phone]  Fax: [phone]    04/13/2022, 9:38 AM  This note was partially dictated with voice recognition software. Similar sounding words can be transcribed inadequately or may not  be corrected upon review.

## 2022-04-18 ENCOUNTER — Ambulatory Visit: Payer: Medicaid Other | Admitting: Dietician

## 2022-04-20 ENCOUNTER — Other Ambulatory Visit: Payer: Self-pay

## 2022-04-20 ENCOUNTER — Observation Stay (HOSPITAL_COMMUNITY): Payer: Medicaid Other

## 2022-04-20 ENCOUNTER — Inpatient Hospital Stay (HOSPITAL_COMMUNITY)
Admission: EM | Admit: 2022-04-20 | Discharge: 2022-04-23 | DRG: 683 | Disposition: A | Discharge status: Home / Self Care | Payer: Medicaid Other | Attending: Student | Admitting: Student

## 2022-04-20 ENCOUNTER — Encounter (HOSPITAL_COMMUNITY): Payer: Self-pay

## 2022-04-20 ENCOUNTER — Emergency Department (HOSPITAL_COMMUNITY): Payer: Medicaid Other

## 2022-04-20 DIAGNOSIS — E113293 Type 2 diabetes mellitus with mild nonproliferative diabetic retinopathy without macular edema, bilateral: Secondary | ICD-10-CM | POA: Diagnosis present

## 2022-04-20 DIAGNOSIS — E1122 Type 2 diabetes mellitus with diabetic chronic kidney disease: Secondary | ICD-10-CM | POA: Diagnosis present

## 2022-04-20 DIAGNOSIS — Z79899 Other long term (current) drug therapy: Secondary | ICD-10-CM

## 2022-04-20 DIAGNOSIS — E11649 Type 2 diabetes mellitus with hypoglycemia without coma: Secondary | ICD-10-CM | POA: Diagnosis present

## 2022-04-20 DIAGNOSIS — N1832 Chronic kidney disease, stage 3b: Secondary | ICD-10-CM | POA: Diagnosis not present

## 2022-04-20 DIAGNOSIS — D638 Anemia in other chronic diseases classified elsewhere: Secondary | ICD-10-CM | POA: Insufficient documentation

## 2022-04-20 DIAGNOSIS — E118 Type 2 diabetes mellitus with unspecified complications: Secondary | ICD-10-CM | POA: Diagnosis present

## 2022-04-20 DIAGNOSIS — I130 Hypertensive heart and chronic kidney disease with heart failure and stage 1 through stage 4 chronic kidney disease, or unspecified chronic kidney disease: Secondary | ICD-10-CM | POA: Diagnosis present

## 2022-04-20 DIAGNOSIS — D631 Anemia in chronic kidney disease: Secondary | ICD-10-CM | POA: Diagnosis present

## 2022-04-20 DIAGNOSIS — Z6835 Body mass index (BMI) 35.0-35.9, adult: Secondary | ICD-10-CM

## 2022-04-20 DIAGNOSIS — T465X6A Underdosing of other antihypertensive drugs, initial encounter: Secondary | ICD-10-CM | POA: Diagnosis present

## 2022-04-20 DIAGNOSIS — G4733 Obstructive sleep apnea (adult) (pediatric): Secondary | ICD-10-CM | POA: Diagnosis present

## 2022-04-20 DIAGNOSIS — I639 Cerebral infarction, unspecified: Secondary | ICD-10-CM | POA: Diagnosis present

## 2022-04-20 DIAGNOSIS — D649 Anemia, unspecified: Secondary | ICD-10-CM

## 2022-04-20 DIAGNOSIS — E1165 Type 2 diabetes mellitus with hyperglycemia: Secondary | ICD-10-CM

## 2022-04-20 DIAGNOSIS — E782 Mixed hyperlipidemia: Secondary | ICD-10-CM | POA: Diagnosis present

## 2022-04-20 DIAGNOSIS — Z794 Long term (current) use of insulin: Secondary | ICD-10-CM

## 2022-04-20 DIAGNOSIS — Z5900 Homelessness unspecified: Secondary | ICD-10-CM

## 2022-04-20 DIAGNOSIS — E876 Hypokalemia: Secondary | ICD-10-CM | POA: Diagnosis present

## 2022-04-20 DIAGNOSIS — Z7982 Long term (current) use of aspirin: Secondary | ICD-10-CM

## 2022-04-20 DIAGNOSIS — I2510 Atherosclerotic heart disease of native coronary artery without angina pectoris: Secondary | ICD-10-CM | POA: Diagnosis present

## 2022-04-20 DIAGNOSIS — E66812 Obesity, class 2: Secondary | ICD-10-CM

## 2022-04-20 DIAGNOSIS — Z833 Family history of diabetes mellitus: Secondary | ICD-10-CM

## 2022-04-20 DIAGNOSIS — I10 Essential (primary) hypertension: Secondary | ICD-10-CM | POA: Diagnosis present

## 2022-04-20 DIAGNOSIS — Z7984 Long term (current) use of oral hypoglycemic drugs: Secondary | ICD-10-CM

## 2022-04-20 DIAGNOSIS — I959 Hypotension, unspecified: Secondary | ICD-10-CM | POA: Diagnosis present

## 2022-04-20 DIAGNOSIS — I5032 Chronic diastolic (congestive) heart failure: Secondary | ICD-10-CM | POA: Diagnosis present

## 2022-04-20 DIAGNOSIS — N179 Acute kidney failure, unspecified: Secondary | ICD-10-CM | POA: Diagnosis not present

## 2022-04-20 DIAGNOSIS — Z8249 Family history of ischemic heart disease and other diseases of the circulatory system: Secondary | ICD-10-CM

## 2022-04-20 DIAGNOSIS — Z825 Family history of asthma and other chronic lower respiratory diseases: Secondary | ICD-10-CM

## 2022-04-20 DIAGNOSIS — N184 Chronic kidney disease, stage 4 (severe): Secondary | ICD-10-CM | POA: Diagnosis present

## 2022-04-20 DIAGNOSIS — Z91148 Patient's other noncompliance with medication regimen for other reason: Secondary | ICD-10-CM

## 2022-04-20 DIAGNOSIS — R9431 Abnormal electrocardiogram [ECG] [EKG]: Secondary | ICD-10-CM

## 2022-04-20 DIAGNOSIS — E559 Vitamin D deficiency, unspecified: Secondary | ICD-10-CM | POA: Diagnosis present

## 2022-04-20 DIAGNOSIS — Z87891 Personal history of nicotine dependence: Secondary | ICD-10-CM

## 2022-04-20 DIAGNOSIS — Z89421 Acquired absence of other right toe(s): Secondary | ICD-10-CM

## 2022-04-20 DIAGNOSIS — Z8673 Personal history of transient ischemic attack (TIA), and cerebral infarction without residual deficits: Secondary | ICD-10-CM

## 2022-04-20 DIAGNOSIS — F4322 Adjustment disorder with anxiety: Secondary | ICD-10-CM | POA: Diagnosis present

## 2022-04-20 LAB — BASIC METABOLIC PANEL
Anion gap: 11 (ref 5–15)
BUN: 53 mg/dL — ABNORMAL HIGH (ref 6–20)
CO2: 20 mmol/L — ABNORMAL LOW (ref 22–32)
Calcium: 8.6 mg/dL — ABNORMAL LOW (ref 8.9–10.3)
Chloride: 108 mmol/L (ref 98–111)
Creatinine, Ser: 4.49 mg/dL — ABNORMAL HIGH (ref 0.44–1.00)
GFR, Estimated: 11 mL/min — ABNORMAL LOW (ref >60)
Glucose, Bld: 89 mg/dL (ref 70–99)
Potassium: 4 mmol/L (ref 3.5–5.1)
Sodium: 139 mmol/L (ref 135–145)

## 2022-04-20 LAB — CBC WITH DIFFERENTIAL/PLATELET
Abs Immature Granulocytes: 0.04 10*3/uL (ref 0.00–0.07)
Basophils Absolute: 0 10*3/uL (ref 0.0–0.1)
Basophils Relative: 0 %
Eosinophils Absolute: 0.2 10*3/uL (ref 0.0–0.5)
Eosinophils Relative: 2 %
HCT: 30.8 % — ABNORMAL LOW (ref 36.0–46.0)
Hemoglobin: 10.3 g/dL — ABNORMAL LOW (ref 12.0–15.0)
Immature Granulocytes: 0 %
Lymphocytes Relative: 11 %
Lymphs Abs: 1.1 10*3/uL (ref 0.7–4.0)
MCH: 29.9 pg (ref 26.0–34.0)
MCHC: 33.4 g/dL (ref 30.0–36.0)
MCV: 89.3 fL (ref 80.0–100.0)
Monocytes Absolute: 0.4 10*3/uL (ref 0.1–1.0)
Monocytes Relative: 4 %
Neutro Abs: 8 10*3/uL — ABNORMAL HIGH (ref 1.7–7.7)
Neutrophils Relative %: 83 %
Platelets: 252 10*3/uL (ref 150–400)
RBC: 3.45 MIL/uL — ABNORMAL LOW (ref 3.87–5.11)
RDW: 13.5 % (ref 11.5–15.5)
WBC: 9.7 10*3/uL (ref 4.0–10.5)
nRBC: 0 % (ref 0.0–0.2)

## 2022-04-20 LAB — URINALYSIS, ROUTINE W REFLEX MICROSCOPIC
Bilirubin Urine: NEGATIVE
Glucose, UA: NEGATIVE mg/dL
Hgb urine dipstick: NEGATIVE
Ketones, ur: NEGATIVE mg/dL
Leukocytes,Ua: NEGATIVE
Nitrite: NEGATIVE
Protein, ur: 100 mg/dL — AB
Specific Gravity, Urine: 1.012 (ref 1.005–1.030)
pH: 5 (ref 5.0–8.0)

## 2022-04-20 LAB — HEPATIC FUNCTION PANEL
ALT: 11 U/L (ref 0–44)
AST: 14 U/L — ABNORMAL LOW (ref 15–41)
Albumin: 3.4 g/dL — ABNORMAL LOW (ref 3.5–5.0)
Alkaline Phosphatase: 50 U/L (ref 38–126)
Bilirubin, Direct: 0.1 mg/dL (ref 0.0–0.2)
Indirect Bilirubin: 0.4 mg/dL (ref 0.3–0.9)
Total Bilirubin: 0.5 mg/dL (ref 0.3–1.2)
Total Protein: 7.1 g/dL (ref 6.5–8.1)

## 2022-04-20 LAB — RAPID URINE DRUG SCREEN, HOSP PERFORMED
Amphetamines: NOT DETECTED
Barbiturates: NOT DETECTED
Benzodiazepines: NOT DETECTED
Cocaine: NOT DETECTED
Opiates: NOT DETECTED
Tetrahydrocannabinol: NOT DETECTED

## 2022-04-20 LAB — IRON AND TIBC
Iron: 39 ug/dL (ref 28–170)
Saturation Ratios: 11 % (ref 10.4–31.8)
TIBC: 365 ug/dL (ref 250–450)
UIBC: 326 ug/dL

## 2022-04-20 LAB — I-STAT BETA HCG BLOOD, ED (MC, WL, AP ONLY): I-stat hCG, quantitative: <5 m[IU]/mL (ref <5)

## 2022-04-20 LAB — CREATININE, URINE, RANDOM: Creatinine, Urine: 96 mg/dL

## 2022-04-20 LAB — MAGNESIUM: Magnesium: 1.9 mg/dL (ref 1.7–2.4)

## 2022-04-20 LAB — CBG MONITORING, ED: Glucose-Capillary: 105 mg/dL — ABNORMAL HIGH (ref 70–99)

## 2022-04-20 LAB — FERRITIN: Ferritin: 83 ng/mL (ref 11–307)

## 2022-04-20 LAB — GLUCOSE, CAPILLARY
Glucose-Capillary: 44 mg/dL — CL (ref 70–99)
Glucose-Capillary: 54 mg/dL — ABNORMAL LOW (ref 70–99)
Glucose-Capillary: 80 mg/dL (ref 70–99)

## 2022-04-20 LAB — TSH: TSH: 1.115 u[IU]/mL (ref 0.350–4.500)

## 2022-04-20 LAB — SODIUM, URINE, RANDOM: Sodium, Ur: 56 mmol/L

## 2022-04-20 MED ORDER — INSULIN ASPART 100 UNIT/ML IJ SOLN
0.0000 [IU] | Freq: Three times a day (TID) | INTRAMUSCULAR | Status: DC
  Administered 2022-04-21: 1 [IU] via SUBCUTANEOUS
  Administered 2022-04-21: 2 [IU] via SUBCUTANEOUS
  Administered 2022-04-21 – 2022-04-22 (×4): 1 [IU] via SUBCUTANEOUS
  Administered 2022-04-23: 2 [IU] via SUBCUTANEOUS

## 2022-04-20 MED ORDER — ROSUVASTATIN CALCIUM 20 MG PO TABS
40.0000 mg | ORAL_TABLET | Freq: Every day | ORAL | Status: DC
  Administered 2022-04-21 – 2022-04-23 (×3): 40 mg via ORAL
  Filled 2022-04-20 (×3): qty 2

## 2022-04-20 MED ORDER — HYDRALAZINE HCL 20 MG/ML IJ SOLN: 10.0000 mg | Freq: Three times a day (TID) | INTRAMUSCULAR | Status: DC | PRN

## 2022-04-20 MED ORDER — BUSPIRONE HCL 5 MG PO TABS
5.0000 mg | ORAL_TABLET | Freq: Two times a day (BID) | ORAL | Status: DC
  Administered 2022-04-20 – 2022-04-23 (×6): 5 mg via ORAL
  Filled 2022-04-20 (×6): qty 1

## 2022-04-20 MED ORDER — SODIUM CHLORIDE 0.9 % IV BOLUS
1000.0000 mL | Freq: Once | INTRAVENOUS | Status: AC
  Administered 2022-04-20: 1000 mL via INTRAVENOUS

## 2022-04-20 MED ORDER — ACETAMINOPHEN 650 MG RE SUPP: 650.0000 mg | Freq: Four times a day (QID) | RECTAL | Status: DC | PRN

## 2022-04-20 MED ORDER — ASPIRIN 81 MG PO TBEC
81.0000 mg | DELAYED_RELEASE_TABLET | Freq: Every day | ORAL | Status: DC
  Administered 2022-04-21 – 2022-04-23 (×3): 81 mg via ORAL
  Filled 2022-04-20 (×3): qty 1

## 2022-04-20 MED ORDER — SODIUM CHLORIDE 0.9 % IV SOLN: INTRAVENOUS | Status: AC

## 2022-04-20 MED ORDER — HEPARIN SODIUM (PORCINE) 5000 UNIT/ML IJ SOLN
5000.0000 [IU] | Freq: Three times a day (TID) | INTRAMUSCULAR | Status: DC
  Administered 2022-04-20 – 2022-04-23 (×8): 5000 [IU] via SUBCUTANEOUS
  Filled 2022-04-20 (×7): qty 1

## 2022-04-20 MED ORDER — ACETAMINOPHEN 325 MG PO TABS: 650.0000 mg | ORAL_TABLET | Freq: Four times a day (QID) | ORAL | Status: DC | PRN

## 2022-04-20 MED ORDER — FENOFIBRATE 54 MG PO TABS
54.0000 mg | ORAL_TABLET | Freq: Every day | ORAL | Status: DC
  Administered 2022-04-21 – 2022-04-23 (×3): 54 mg via ORAL
  Filled 2022-04-20 (×3): qty 1

## 2022-04-20 NOTE — Assessment & Plan Note (Addendum)
51 year old female presenting with dizziness found to have acute on chronic renal failure vs. Worsening renal disease in setting of uncontrolled HTN and diabetes  Creatinine in December 2022: 2.1>june 2023: 2.9>today: 4.49 -obs to telemetry -gentle, time limited IVF -check renal US -urine studies, UA -strict I/O  -hold nephrotoxic drugs -needs nephrology outpatient vs. Inpatient if renal function worsens

## 2022-04-20 NOTE — Assessment & Plan Note (Addendum)
A1c of 9.2 in 01/2022 Noncompliant with medication, but is really trying hard to do the right thing  Sugars have been on lower end per patient Will hold her 70/30 for today and just do SSI and accucheck qac/hs  May need adjusted to lower dose with worsening renal function  Can likely add this back tomorrow if CBG stable.

## 2022-04-20 NOTE — ED Notes (Signed)
ED TO INPATIENT HANDOFF REPORT  ED Nurse Name and Phone #: [phone]  S Name/Age/Gender [name] 51 y.o. female Room/Bed: H011C/H011C  Code Status   Code Status: Prior  Home/SNF/Other Home Patient oriented to: self, place, time, and situation Is this baseline? Yes   Triage Complete: Triage complete  Chief Complaint Acute renal failure superimposed on stage 3b chronic kidney disease ([place]) [N17.9, N18.32]  Triage Note Pt BIB [facility] for eval of dizziness, weakness and orthostatic changes at UC. EMS reports 12 lead unremarkable, normotensive en route. #18G L AC. EMS reports pt has not taken her antihypertensives x 1 week prior to arrival.     Allergies Allergies  Allergen Reactions   Bee Pollen Other (See Comments)    Seasonal Allergies   Dust Mite Extract Cough    Sneezing & Cough   Mixed Ragweed Itching    Level of Care/Admitting Diagnosis ED Disposition     ED Disposition  Admit   Condition  --   Comment  Hospital Area: [facility] [[id number]]  Level of Care: Telemetry Cardiac [[id number]]  May place patient in observation at [facility] or [facility] if equivalent level of care is available:: Yes  Covid Evaluation: Asymptomatic - no recent exposure (last 10 days) testing not required  Diagnosis: Acute renal failure superimposed on stage 3b chronic kidney disease [facility]) [[id number]]  Admitting Physician: Orma Flaming [[id number]]  Attending Physician: Adora Fridge          B Medical/Surgery History Past Medical History:  Diagnosis Date   Arthritis    Asthma    Cataract    Mixed form OU   CKD (chronic kidney disease)    Coronary artery disease    Diabetes mellitus    Diabetic retinopathy ([place])    NPDR OU   Hyperlipidemia    Hypertension    Hypertensive retinopathy    OU   Left thyroid nodule    diagnosed 07/2018   PAC (premature atrial contraction) 02/15/2021   Pseudotumor cerebri    Stroke ([place])    Vitamin D deficiency     Past Surgical History:  Procedure Laterality Date   ACHILLES TENDON REPAIR Right    ACHILLES TENDON SURGERY Left 02/19/2021   Procedure: ACHILLES TENDON REPAIR WITH GRAFT;  Surgeon: Felipa Furnace, DPM;  Location: [place];  Service: Podiatry;  Laterality: Left;  BLOCK   AMPUTATION TOE Right 05/28/2021   Procedure: AMPUTATION TOE;  Surgeon: Lorenda Peck, MD;  Location: [facility];  Service: Podiatry;  Laterality: Right;  Surgical team will do block   GASTROC RECESSION EXTREMITY Left 02/19/2021   Procedure: GASTROC RECESSION EXTREMITY;  Surgeon: Felipa Furnace, DPM;  Location: [facility];  Service: Podiatry;  Laterality: Left;  Block   TUBAL LIGATION       A IV Location/Drains/Wounds Patient Lines/Drains/Airways Status     Active Line/Drains/Airways     Name Placement date Placement time Site Days   Peripheral IV 04/20/22 18 G Anterior;Left Forearm 04/20/22  1409  Forearm  less than 1   External Urinary Catheter 07/21/21  1902  --  273   Incision (Closed) 02/19/21 Leg Left 02/19/21  1505  -- 425   Incision (Closed) 05/28/21 Foot Right 05/28/21  1605  -- 327            Intake/Output Last 24 hours No intake or output data in the 24 hours ending 04/20/22 1623  Labs/Imaging Results for orders placed or  performed during the hospital encounter of 04/20/22 (from the past 48 hour(s))  CBG monitoring, ED     Status: Abnormal   Collection Time: 04/20/22  1:42 PM  Result Value Ref Range   Glucose-Capillary 105 (H) 70 - 99 mg/dL    Comment: Glucose reference range applies only to samples taken after fasting for at least 8 hours.  Basic metabolic panel     Status: Abnormal   Collection Time: 04/20/22  2:07 PM  Result Value Ref Range   Sodium 139 135 - 145 mmol/L   Potassium 4.0 3.5 - 5.1 mmol/L   Chloride 108 98 - 111 mmol/L   CO2 20 (L) 22 - 32 mmol/L   Glucose, Bld 89 70 - 99 mg/dL    Comment: Glucose reference range applies only to samples taken  after fasting for at least 8 hours.   BUN 53 (H) 6 - 20 mg/dL   Creatinine, Ser 4.49 (H) 0.44 - 1.00 mg/dL   Calcium 8.6 (L) 8.9 - 10.3 mg/dL   GFR, Estimated 11 (L) >60 mL/min    Comment: (NOTE) Calculated using the CKD-EPI Creatinine Equation (2021)    Anion gap 11 5 - 15    Comment: Performed at [facility] [address] [place], [place] [address]  CBC with Differential     Status: Abnormal   Collection Time: 04/20/22  2:07 PM  Result Value Ref Range   WBC 9.7 4.0 - 10.5 K/uL   RBC 3.45 (L) 3.87 - 5.11 MIL/uL   Hemoglobin 10.3 (L) 12.0 - 15.0 g/dL   HCT 30.8 (L) 36.0 - 46.0 %   MCV 89.3 80.0 - 100.0 fL   MCH 29.9 26.0 - 34.0 pg   MCHC 33.4 30.0 - 36.0 g/dL   RDW 13.5 11.5 - 15.5 %   Platelets 252 150 - 400 K/uL   nRBC 0.0 0.0 - 0.2 %   Neutrophils Relative % 83 %   Neutro Abs 8.0 (H) 1.7 - 7.7 K/uL   Lymphocytes Relative 11 %   Lymphs Abs 1.1 0.7 - 4.0 K/uL   Monocytes Relative 4 %   Monocytes Absolute 0.4 0.1 - 1.0 K/uL   Eosinophils Relative 2 %   Eosinophils Absolute 0.2 0.0 - 0.5 K/uL   Basophils Relative 0 %   Basophils Absolute 0.0 0.0 - 0.1 K/uL   Immature Granulocytes 0 %   Abs Immature Granulocytes 0.04 0.00 - 0.07 K/uL    Comment: Performed at [facility] [address], [place], [place] [address]  TSH     Status: None   Collection Time: 04/20/22  2:07 PM  Result Value Ref Range   TSH 1.115 0.350 - 4.500 uIU/mL    Comment: Performed by a 3rd Generation assay with a functional sensitivity of <=0.01 uIU/mL. Performed at [facility], [place] [address], [place], [place] [address]   Hepatic function panel     Status: Abnormal   Collection Time: 04/20/22  2:07 PM  Result Value Ref Range   Total Protein 7.1 6.5 - 8.1 g/dL   Albumin 3.4 (L) 3.5 - 5.0 g/dL   AST 14 (L) 15 - 41 U/L   ALT 11 0 - 44 U/L   Alkaline Phosphatase 50 38 - 126 U/L   Total Bilirubin 0.5 0.3 - 1.2 mg/dL   Bilirubin, Direct 0.1 0.0 - 0.2 mg/dL   Indirect Bilirubin  0.4 0.3 - 0.9 mg/dL    Comment: Performed at [facility]  [facility], [place] [address], [place], [place] [address]  I-Stat beta hCG blood, ED     Status: None   Collection Time: 04/20/22  2:14 PM  Result Value Ref Range   I-stat hCG, quantitative <5.0 <5 mIU/mL   Comment 3            Comment:   GEST. AGE      CONC.  (mIU/mL)   <=1 WEEK        5 - 50     2 WEEKS       50 - 500     3 WEEKS       100 - 10,000     4 WEEKS     1,000 - 30,000        FEMALE AND NON-PREGNANT FEMALE:     LESS THAN 5 mIU/mL    CT Head Wo Contrast  Result Date: 04/20/2022 CLINICAL DATA:  Dizziness EXAM: CT HEAD WITHOUT CONTRAST TECHNIQUE: Contiguous axial images were obtained from the base of the skull through the vertex without intravenous contrast. RADIATION DOSE REDUCTION: This exam was performed according to the departmental dose-optimization program which includes automated exposure control, adjustment of the mA and/or kV according to patient size and/or use of iterative reconstruction technique. COMPARISON:  CT brain 09/30/2021, 10/08/2019 FINDINGS: Brain: No acute territorial infarction, hemorrhage or intracranial mass. Mild white matter hypodensity consistent with chronic small vessel ischemic change. Small chronic white matter and anterior callosum infarcts. Stable ventricle size. Vascular: No hyperdense vessels.  Carotid vascular calcification Skull: Normal. Negative for fracture or focal lesion. Sinuses/Orbits: No acute finding. Other: None IMPRESSION: 1. No CT evidence for acute intracranial abnormality. 2. Chronic small vessel ischemic changes of the white matter with multiple small chronic appearing infarcts. Electronically Signed   By: Donavan Foil M.D.   On: 04/20/2022 15:40    Pending Labs Unresulted Labs (From admission, onward)     Start     Ordered   04/20/22 1545  Urinalysis, Routine w reflex microscopic  Once,   URGENT        04/20/22 1544            Vitals/Pain Today's Vitals   04/20/22 1307  04/20/22 1310  BP:  119/64  Pulse:  75  Resp:  16  Temp:  97.8 F (36.6 C)  SpO2:  98%  Weight: 110 kg   Height: 5\' 9"  (1.753 m)   PainSc: 0-No pain     Isolation Precautions No active isolations  Medications Medications  sodium chloride 0.9 % bolus 1,000 mL (1,000 mLs Intravenous New Bag/Given 04/20/22 1605)    Mobility walks     Focused Assessments Neuro Assessment Handoff:          Neuro Assessment: Within Defined Limits Neuro Checks:      Last Documented NIHSS Modified Score:   Has TPA been given? No If patient is a Neuro Trauma and patient is going to OR before floor call report to [facility] nurse: [phone] or [phone]  , Renal Assessment Handoff:    R Recommendations: See Admitting Provider Note  Report given to:   Additional Notes:

## 2022-04-20 NOTE — Assessment & Plan Note (Signed)
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

## 2022-04-20 NOTE — Assessment & Plan Note (Signed)
Continue buspar BID  

## 2022-04-20 NOTE — Assessment & Plan Note (Addendum)
Euvolemic  Strict I/o Echo 12/22: EF of 55% with normal LVF. Indeterminate diastolic function. Moderate, grade 2 AR.

## 2022-04-20 NOTE — ED Provider Notes (Signed)
[facility] EMERGENCY DEPARTMENT Provider Note   CSN: [id number] Arrival date & time: 04/20/22  1301     History  Chief Complaint  Patient presents with   Hypertension   Dizziness    [name] is a 50 y.o. female.   Hypertension  Dizziness    Patient with medical history of hypertension, hyperlipidemia, diabetes on insulin presents today due to dizziness.  Patient states that started earlier today, felt like how it typically feels when her sugar is too low.  Seen at urgent care, sent to ED for hypotension during orthostatic vitals.  Patient states she did not take her blood pressure medicine for the last week and took it for the first time today.  She did not pass out, she feels dizzy like "I am drunk when I walk".  This has been improving, endorses slight headache and blurry vision that improved after eating something.  States she had decreased intake today.  Denies any chest pain, shortness of breath, other changes in medication.  Home Medications Prior to Admission medications   Medication Sig Start Date End Date Taking? Authorizing Provider  albuterol (VENTOLIN HFA) 108 (90 Base) MCG/ACT inhaler Inhale 1-2 puffs into the lungs every 6 (six) hours as needed for wheezing or shortness of breath.   Yes [provider]  amLODipine-benazepril (LOTREL) 10-40 MG capsule Take 1 capsule by mouth daily.   Yes [provider]  aspirin 81 MG EC tablet Take 1 tablet (81 mg total) by mouth daily. 10/14/20  Yes Patwardhan, Manish J, MD  busPIRone (BUSPAR) 5 MG tablet Take 5 mg by mouth 2 (two) times daily. 10/14/21  Yes [provider]  EPINEPHrine 0.3 mg/0.3 mL IJ SOAJ injection Inject 0.3 mg into the muscle as needed for anaphylaxis. 12/08/21  Yes Redwine, Madison A, PA-C  fenofibrate (TRICOR) 48 MG tablet Take 48 mg by mouth daily. 11/02/21  Yes [provider]  furosemide (LASIX) 40 MG tablet Take 1 tablet (40 mg total) by mouth daily.  07/22/21 04/20/22 Yes Patwardhan, Manish J, MD  glipiZIDE (GLUCOTROL XL) 5 MG 24 hr tablet Take 5 mg by mouth daily with breakfast.   Yes [provider]  hydrOXYzine (VISTARIL) 25 MG capsule Take 25 mg by mouth at bedtime. 10/04/21  Yes [provider]  insulin aspart protamine - aspart (NOVOLOG 70/30 MIX) (70-30) 100 UNIT/ML FlexPen Inject 20 Units into the skin 2 (two) times daily with a meal.   Yes [provider]  isosorbide-hydrALAZINE (BIDIL) 20-37.5 MG tablet Take 2 tablets by mouth 3 (three) times daily. 10/14/20  Yes Patwardhan, Manish J, MD  labetalol (NORMODYNE) 300 MG tablet Take 300 mg by mouth 2 (two) times daily. 08/16/21  Yes [provider]  rosuvastatin (CRESTOR) 40 MG tablet Take 40 mg by mouth daily.   Yes [provider]  Accu-Chek FastClix Lancets MISC 4 (four) times daily. 05/28/19   [provider]  ACCU-CHEK GUIDE test strip 1 each by Other route See admin instructions. for testing 08/28/20   Woodroe Mode, MD  blood glucose meter kit and supplies KIT Dispense based on patient and insurance preference. Use up to four times daily as directed. (FOR ICD-9 250.00, 250.01). 04/12/18   Martyn Ehrich, NP  cloNIDine (CATAPRES - DOSED IN MG/24 HR) 0.3 mg/24hr patch Place 1 patch (0.3 mg total) onto the skin once a week. Patient taking differently: Place 0.3 mg onto the skin every Wednesday. 09/08/21   Lawerance Cruel  C, PA-C  Continuous Blood Gluc Receiver (DEXCOM G7 RECEIVER) DEVI Use to monitor BG continuously 04/13/22   Cassandria Anger, MD  Continuous Blood Gluc Sensor (DEXCOM G7 SENSOR) MISC Change sensor every 10 days 04/13/22   Cassandria Anger, MD  fluticasone (FLONASE) 50 MCG/ACT nasal spray Place 2 sprays into both nostrils daily as needed for allergies or rhinitis. Patient not taking: Reported on 04/20/2022    [provider]  Insulin Syringes, Disposable, V-694 1 ML MISC 1 application by Does not apply  route 2 (two) times a day. 02/20/19   Love, Ivan Anchors, PA-C  ipratropium-albuterol (DUONEB) 0.5-2.5 (3) MG/3ML SOLN Take 3 mLs by nebulization every 4 (four) hours as needed. Patient not taking: Reported on 03/30/2022 06/25/20   Little Ishikawa, MD  methocarbamol (ROBAXIN) 750 MG tablet Take 1 tablet (750 mg total) by mouth every 8 (eight) hours as needed (muscle spasm/pain). Patient not taking: Reported on 03/30/2022 09/30/21   Lajean Saver, MD      Allergies    Bee pollen, Dust mite extract, and Mixed ragweed    Review of Systems   Review of Systems  Neurological:  Positive for dizziness.    Physical Exam Updated Vital Signs BP 119/64 (BP Location: Left Arm)   Pulse 75   Temp 97.8 F (36.6 C)   Resp 16   Ht 5' 9"  (1.753 m)   Wt 110 kg   SpO2 98%   BMI 35.81 kg/m  Physical Exam Vitals and nursing note reviewed. Exam conducted with a chaperone present.  Constitutional:      Appearance: Normal appearance.  HENT:     Head: Normocephalic and atraumatic.  Eyes:     General: No scleral icterus.       Right eye: No discharge.        Left eye: No discharge.     Extraocular Movements: Extraocular movements intact.     Pupils: Pupils are equal, round, and reactive to light.  Cardiovascular:     Rate and Rhythm: Normal rate and regular rhythm.     Pulses: Normal pulses.     Heart sounds: Normal heart sounds. No murmur heard.    No friction rub. No gallop.  Pulmonary:     Effort: Pulmonary effort is normal. No respiratory distress.     Breath sounds: Normal breath sounds.  Abdominal:     General: Abdomen is flat. Bowel sounds are normal. There is no distension.     Palpations: Abdomen is soft.     Tenderness: There is no abdominal tenderness.  Skin:    General: Skin is warm and dry.     Coloration: Skin is not jaundiced.  Neurological:     Mental Status: She is alert. Mental status is at baseline.     Coordination: Coordination normal.     Comments: Cranial nerves II  through XII are grossly intact.  Grip strength is equal bilaterally, no pronator drift normal finger-nose.  Lower extremity strength equal bilaterally.     ED Results / Procedures / Treatments   Labs (all labs ordered are listed, but only abnormal results are displayed) Labs Reviewed  BASIC METABOLIC PANEL - Abnormal; Notable for the following components:      Result Value   CO2 20 (*)    BUN 53 (*)    Creatinine, Ser 4.49 (*)    Calcium 8.6 (*)    GFR, Estimated 11 (*)    All other components within normal limits  CBC  WITH DIFFERENTIAL/PLATELET - Abnormal; Notable for the following components:   RBC 3.45 (*)    Hemoglobin 10.3 (*)    HCT 30.8 (*)    Neutro Abs 8.0 (*)    All other components within normal limits  HEPATIC FUNCTION PANEL - Abnormal; Notable for the following components:   Albumin 3.4 (*)    AST 14 (*)    All other components within normal limits  CBG MONITORING, ED - Abnormal; Notable for the following components:   Glucose-Capillary 105 (*)    All other components within normal limits  TSH  URINALYSIS, ROUTINE W REFLEX MICROSCOPIC  I-STAT BETA HCG BLOOD, ED ([facility], [facility], [facility] ONLY)    EKG EKG Interpretation  Date/Time:  Wednesday April 20 2022 13:16:54 EDT Ventricular Rate:  76 PR Interval:  182 QRS Duration: 86 QT Interval:  436 QTC Calculation: 490 R Axis:   61 Text Interpretation: Normal sinus rhythm T wave abnormality, consider inferolateral ischemia Prolonged QT Abnormal ECG When compared with ECG of 21-Jul-2021 13:26, PREVIOUS ECG IS PRESENT Confirmed by Dene Gentry [phone]) on 04/20/2022 2:11:24 PM  Radiology CT Head Wo Contrast  Result Date: 04/20/2022 CLINICAL DATA:  Dizziness EXAM: CT HEAD WITHOUT CONTRAST TECHNIQUE: Contiguous axial images were obtained from the base of the skull through the vertex without intravenous contrast. RADIATION DOSE REDUCTION: This exam was performed according to the departmental dose-optimization program which  includes automated exposure control, adjustment of the mA and/or kV according to patient size and/or use of iterative reconstruction technique. COMPARISON:  CT brain 09/30/2021, 10/08/2019 FINDINGS: Brain: No acute territorial infarction, hemorrhage or intracranial mass. Mild white matter hypodensity consistent with chronic small vessel ischemic change. Small chronic white matter and anterior callosum infarcts. Stable ventricle size. Vascular: No hyperdense vessels.  Carotid vascular calcification Skull: Normal. Negative for fracture or focal lesion. Sinuses/Orbits: No acute finding. Other: None IMPRESSION: 1. No CT evidence for acute intracranial abnormality. 2. Chronic small vessel ischemic changes of the white matter with multiple small chronic appearing infarcts. Electronically Signed   By: Donavan Foil M.D.   On: 04/20/2022 15:40    Procedures Procedures    Medications Ordered in ED Medications  sodium chloride 0.9 % bolus 1,000 mL (has no administration in time range)    ED Course/ Medical Decision Making/ A&P                           Medical Decision Making Amount and/or Complexity of Data Reviewed Labs: ordered. Radiology: ordered.  Risk Decision regarding hospitalization.   Patient presents due to dizziness/blood pressure changes.  Differential includes not limited to medication reaction, electrolyte derangement, AKI, hypoglycemia, hyperglycemia, arrhythmia, intracranial process.  On exam patient has no focal deficits.  Follows commands, S1-S2 and lungs are clear to auscultation bilaterally.  Patient is not hypotensive, 119/64. -BP 119/64 (BP Location: Left Arm)   Pulse 75   Temp 97.8 F (36.6 C)   Resp 16   Ht 5' 9"  (1.753 m)   Wt 110 kg   SpO2 98%   BMI 35.81 kg/m   I reviewed external records including PCP note, urgent care note.  Reviewed patient's home medication list as well.  Patient is on multiple antihypertensives, she has not been taking them for a week and  took them all today for the first time.  I suspect that may be related to the dizziness/blood pressure changes.  No chest pain or shortness of breath, does not appear  consistent with ACS.  I ordered, viewed and interpreted laboratory work-up. CBC without leukocytosis.  Stable anemia with a hemoglobin of 10.3 Hepatic function panel is unremarkable EKG negative, not pregnant not an ectopic BMP is notable for new AKI.  Patient is chronic kidney  disease but is not followed by nephrology.  Her creatinine was 2.293 months ago, today it is 4.49.  Concern for AKI. TSH is unremarkable  I ordered and viewed EKG.  Patient is in sinus rhythm.  I ordered, viewed CT head without contrast.  Negative for any acute process, agree with radiologist interpretation.  Patient has a new AKI, she also is not currently followed by nephrology.  Although she has been having some chronic renal impairment the renal function is doubled, this could be secondary to medical noncompliance and restarting all of her blood pressure medicine on Monday.  Regardless, I do think patient needs admission for AKI work-up.  I will consult hospitalist service for admission.        Final Clinical Impression(s) / ED Diagnoses Final diagnoses:  AKI (acute kidney injury) [facility])    Rx / [place] Orders ED Discharge Orders     None         Sherrill Raring, PA-C 04/20/22 1547    Valarie Merino, MD 04/25/22 2354

## 2022-04-20 NOTE — Assessment & Plan Note (Signed)
Continue ASA and high dose statin

## 2022-04-20 NOTE — Assessment & Plan Note (Signed)
cpap nightly  

## 2022-04-20 NOTE — Assessment & Plan Note (Signed)
History of noncompliance with medication then took all of her medication today and was hypotensive and dizzy Hold medication for now in setting of soft blood pressures  Check orthostatics Prn hydralazine for now Add back home medication as pressures allow

## 2022-04-20 NOTE — ED Triage Notes (Addendum)
Pt BIB [facility] for eval of dizziness, weakness and orthostatic changes at UC. EMS reports 12 lead unremarkable, normotensive en route. #18G L AC. EMS reports pt has not taken her antihypertensives x 1 week prior to arrival.

## 2022-04-20 NOTE — Assessment & Plan Note (Signed)
Social work has met with Mother/newborn, no barriers to discharge.  Resources provided.  

## 2022-04-20 NOTE — Assessment & Plan Note (Signed)
Optimize electrolytes Keep on telemetry Avoid qt prolonging drugs  Repeat ekg in AM   

## 2022-04-20 NOTE — Plan of Care (Signed)
  Problem: Education: Goal: Ability to describe self-care measures that may prevent or decrease complications (Diabetes Survival Skills Education) will improve Outcome: Progressing   

## 2022-04-20 NOTE — Assessment & Plan Note (Signed)
Continue crestor 

## 2022-04-20 NOTE — Plan of Care (Signed)
  Problem: Education: Goal: Ability to describe self-care measures that may prevent or decrease complications (Diabetes Survival Skills Education) will improve Outcome: Progressing   Problem: Fluid Volume: Goal: Ability to maintain a balanced intake and output will improve Outcome: Progressing   Problem: Education: Goal: Knowledge of General Education information will improve Description: Including pain rating scale, medication(s)/side effects and non-pharmacologic comfort measures Outcome: Progressing   Problem: Activity: Goal: Risk for activity intolerance will decrease Outcome: Progressing   Problem: Safety: Goal: Ability to remain free from injury will improve Outcome: Progressing

## 2022-04-20 NOTE — Progress Notes (Signed)
NEW ADMISSION NOTE New Admission Note:   Arrival Method: Wheelchair Mental Orientation: AAox4 Telemetry: [phone] Assessment: Completed Skin: Intact IV: LFA Pain: 0/10 Tubes: n/a Safety Measures: Safety Fall Prevention Plan has been given, discussed and signed Admission: Completed [facility] Orientation: Patient has been orientated to the room, unit and staff.  Family: none at bedside  Orders have been reviewed and implemented. Will continue to monitor the patient. Call light has been placed within reach and bed alarm has been activated.   Vira Agar, RN

## 2022-04-20 NOTE — H&P (Signed)
History and Physical    Patient: [name] DOB: August 22, 1970 DOA: 04/20/2022 DOS: the patient was seen and examined on 04/20/2022 PCP: [name], MD  Patient coming from: Home - homeless.    Chief Complaint: dizziness   HPI: [name] is a 51 y.o. female with medical history significant of CAD, T2DM, HTN, HLD, asthma, pseudotumor cerebri, hx of CVA and medical noncompliance who went to her primary care doctor today and found to be hypotensive. She has been feeling fine until today and then she started to have some dizziness, blurry vision, headache, gait dysfunction. She felt fine sitting down, her symptoms mainly are with standing and walking. She has not been taking her blood pressure medication and then took all of her pills today. She states she does not take her pills consistently and sometimes when she does have them she doesn't remember to take them.   Denies any fever/chills,  chest pain or palpitations, shortness of breath or cough, abdominal pain, N/V/D, dysuria or leg swelling.    She does not smoke cigarettes, does smoke MJ. She does not drink alcohol.   ER Course:  vitals: afebrile, bp: 119/64, HR; 75, RR: 16, oxygen: 98%Ra Pertinent labs: hgb: 10.3, BUN: 53, creatinine: 4.49,  CT head: no acute finding In ED: given IVF, orthostatics ordered. TRH asked to admit.   Review of Systems: As mentioned in the history of present illness. All other systems reviewed and are negative. Past Medical History:  Diagnosis Date   Arthritis    Asthma    Cataract    Mixed form OU   CKD (chronic kidney disease)    Coronary artery disease    Diabetes mellitus    Diabetic retinopathy ([place])    NPDR OU   Hyperlipidemia    Hypertension    Hypertensive retinopathy    OU   Left thyroid nodule    diagnosed 07/2018   PAC (premature atrial contraction) 02/15/2021   Pseudotumor cerebri    Stroke ([place])    Vitamin D deficiency    Past Surgical History:   Procedure Laterality Date   ACHILLES TENDON REPAIR Right    ACHILLES TENDON SURGERY Left 02/19/2021   Procedure: ACHILLES TENDON REPAIR WITH GRAFT;  Surgeon: Felipa Furnace, DPM;  Location: [facility];  Service: Podiatry;  Laterality: Left;  BLOCK   AMPUTATION TOE Right 05/28/2021   Procedure: AMPUTATION TOE;  Surgeon: Lorenda Peck, MD;  Location: [facility];  Service: Podiatry;  Laterality: Right;  Surgical team will do block   GASTROC RECESSION EXTREMITY Left 02/19/2021   Procedure: GASTROC RECESSION EXTREMITY;  Surgeon: Felipa Furnace, DPM;  Location: [facility];  Service: Podiatry;  Laterality: Left;  Block   TUBAL LIGATION     Social History:  reports that she quit smoking about 6 years ago. Her smoking use included cigarettes. She has a 15.00 pack-year smoking history. She has never used smokeless tobacco. She reports that she does not currently use alcohol. She reports current drug use. Drug: Marijuana.  Allergies  Allergen Reactions   Bee Pollen Other (See Comments)    Seasonal Allergies   Dust Mite Extract Cough    Sneezing & Cough   Mixed Ragweed Itching    Family History  Problem Relation Age of Onset   Asthma Mother    Diabetes Father    Hypertension Father    Kidney disease Father    Heart attack Father    Heart failure Father  Hypertension Sister    Asthma Sister    Congestive Heart Failure Maternal Aunt    Diabetes Maternal Grandmother    Congestive Heart Failure Maternal Grandmother    Lung disease Maternal Grandmother    Asthma Other    Hyperlipidemia Other    Hypertension Other    Cancer Other     Prior to Admission medications   Medication Sig Start Date End Date Taking? Authorizing Provider  albuterol (VENTOLIN HFA) 108 (90 Base) MCG/ACT inhaler Inhale 1-2 puffs into the lungs every 6 (six) hours as needed for wheezing or shortness of breath.   Yes [provider]  amLODipine-benazepril (LOTREL) 10-40 MG capsule  Take 1 capsule by mouth daily.   Yes [provider]  aspirin 81 MG EC tablet Take 1 tablet (81 mg total) by mouth daily. 10/14/20  Yes Patwardhan, Manish J, MD  busPIRone (BUSPAR) 5 MG tablet Take 5 mg by mouth 2 (two) times daily. 10/14/21  Yes [provider]  EPINEPHrine 0.3 mg/0.3 mL IJ SOAJ injection Inject 0.3 mg into the muscle as needed for anaphylaxis. 12/08/21  Yes Redwine, Madison A, PA-C  fenofibrate (TRICOR) 48 MG tablet Take 48 mg by mouth daily. 11/02/21  Yes [provider]  furosemide (LASIX) 40 MG tablet Take 1 tablet (40 mg total) by mouth daily. 07/22/21 04/20/22 Yes Patwardhan, Manish J, MD  glipiZIDE (GLUCOTROL XL) 5 MG 24 hr tablet Take 5 mg by mouth daily with breakfast.   Yes [provider]  hydrOXYzine (VISTARIL) 25 MG capsule Take 25 mg by mouth at bedtime. 10/04/21  Yes [provider]  insulin aspart protamine - aspart (NOVOLOG 70/30 MIX) (70-30) 100 UNIT/ML FlexPen Inject 20 Units into the skin 2 (two) times daily with a meal.   Yes [provider]  isosorbide-hydrALAZINE (BIDIL) 20-37.5 MG tablet Take 2 tablets by mouth 3 (three) times daily. 10/14/20  Yes Patwardhan, Manish J, MD  labetalol (NORMODYNE) 300 MG tablet Take 300 mg by mouth 2 (two) times daily. 08/16/21  Yes [provider]  rosuvastatin (CRESTOR) 40 MG tablet Take 40 mg by mouth daily.   Yes [provider]  Accu-Chek FastClix Lancets MISC 4 (four) times daily. 05/28/19   [provider]  ACCU-CHEK GUIDE test strip 1 each by Other route See admin instructions. for testing 08/28/20   Woodroe Mode, MD  blood glucose meter kit and supplies KIT Dispense based on patient and insurance preference. Use up to four times daily as directed. (FOR ICD-9 250.00, 250.01). 04/12/18   Martyn Ehrich, NP  cloNIDine (CATAPRES - DOSED IN MG/24 HR) 0.3 mg/24hr patch Place 1 patch (0.3 mg total) onto the skin once a week. Patient taking differently:  Place 0.3 mg onto the skin every Wednesday. 09/08/21   Cantwell, Celeste C, PA-C  Continuous Blood Gluc Receiver (DEXCOM G7 RECEIVER) DEVI Use to monitor BG continuously 04/13/22   Cassandria Anger, MD  Continuous Blood Gluc Sensor (DEXCOM G7 SENSOR) MISC Change sensor every 10 days 04/13/22   Cassandria Anger, MD  fluticasone (FLONASE) 50 MCG/ACT nasal spray Place 2 sprays into both nostrils daily as needed for allergies or rhinitis. Patient not taking: Reported on 04/20/2022    [provider]  Insulin Syringes, Disposable, W-431 1 ML MISC 1 application by Does not apply route 2 (two) times a day. 02/20/19   Love, Ivan Anchors, PA-C  ipratropium-albuterol (DUONEB) 0.5-2.5 (3) MG/3ML SOLN Take 3 mLs by nebulization every 4 (four) hours  as needed. Patient not taking: Reported on 03/30/2022 06/25/20   Little Ishikawa, MD  methocarbamol (ROBAXIN) 750 MG tablet Take 1 tablet (750 mg total) by mouth every 8 (eight) hours as needed (muscle spasm/pain). Patient not taking: Reported on 03/30/2022 09/30/21   Lajean Saver, MD    Physical Exam: Vitals:   04/20/22 1307 04/20/22 1310 04/20/22 1654 04/20/22 1754  BP:  119/64  (!) 153/84  Pulse:  75  73  Resp:  16  14  Temp:  97.8 F (36.6 C) 97.7 F (36.5 C) 97.9 F (36.6 C)  TempSrc:   Oral Oral  SpO2:  98%  100%  Weight: 110 kg   108.5 kg  Height: 5' 9"  (1.753 m)   5' 9"  (1.753 m)   General:  Appears calm and comfortable and is in NAD Eyes:  PERRL, EOMI, normal lids, iris ENT:  grossly normal hearing, lips & tongue, mmm; appropriate dentition Neck:  no LAD, masses or thyromegaly; no carotid bruits Cardiovascular:  RRR, no m/r/g. No LE edema.  Respiratory:   CTA bilaterally with no wheezes/rales/rhonchi.  Normal respiratory effort. Abdomen:  soft, NT, ND, NABS Back:   normal alignment, no CVAT Skin:  no rash or induration seen on limited exam Musculoskeletal:  grossly normal tone BUE/BLE, good ROM, no bony abnormality Lower  extremity:  No LE edema.  Limited foot exam with no ulcerations.  2+ distal pulses. Psychiatric:  grossly normal mood and affect, speech fluent and appropriate, AOx3 Neurologic:  CN 2-12 grossly intact, moves all extremities in coordinated fashion, sensation intact   Radiological Exams on Admission: Independently reviewed - see discussion in A/P where applicable  US RENAL  Result Date: 04/20/2022 CLINICAL DATA:  Renal failure EXAM: RENAL / URINARY TRACT ULTRASOUND COMPLETE COMPARISON:  None Available. FINDINGS: Right Kidney: Renal measurements: 11.4 x 4.3 x 5.3 cm = volume: 135 mL. Echogenicity within normal limits. No mass or hydronephrosis visualized. Left Kidney: Renal measurements: 11.1 x 6.1 x 5.4 cm = volume: 187.8 mL. Echogenicity within normal limits. No mass visualized. Mild pelviectasis. Bladder: Appears normal for degree of bladder distention. Other: None. IMPRESSION: Mild left renal pelviectasis. Electronically Signed   By: Ulyses Jarred M.D.   On: 04/20/2022 19:07   CT Head Wo Contrast  Result Date: 04/20/2022 CLINICAL DATA:  Dizziness EXAM: CT HEAD WITHOUT CONTRAST TECHNIQUE: Contiguous axial images were obtained from the base of the skull through the vertex without intravenous contrast. RADIATION DOSE REDUCTION: This exam was performed according to the departmental dose-optimization program which includes automated exposure control, adjustment of the mA and/or kV according to patient size and/or use of iterative reconstruction technique. COMPARISON:  CT brain 09/30/2021, 10/08/2019 FINDINGS: Brain: No acute territorial infarction, hemorrhage or intracranial mass. Mild white matter hypodensity consistent with chronic small vessel ischemic change. Small chronic white matter and anterior callosum infarcts. Stable ventricle size. Vascular: No hyperdense vessels.  Carotid vascular calcification Skull: Normal. Negative for fracture or focal lesion. Sinuses/Orbits: No acute finding. Other: None  IMPRESSION: 1. No CT evidence for acute intracranial abnormality. 2. Chronic small vessel ischemic changes of the white matter with multiple small chronic appearing infarcts. Electronically Signed   By: Donavan Foil M.D.   On: 04/20/2022 15:40    EKG: Independently reviewed.  NSR with rate 76; nonspecific ST changes with no evidence of acute ischemia Prolonged QT   Labs on Admission: I have personally reviewed the available labs and imaging studies at the time of the admission.  Pertinent  labs:   hgb: 10.3,  BUN: 53,  creatinine: 4.49,  Assessment and Plan: Principal Problem:   Acute renal failure superimposed on stage 3b chronic kidney disease (HCC) Active Problems:   Type 2 diabetes with complication (HCC)   Normocytic anemia   Prolonged QT interval   Chronic heart failure with preserved ejection fraction (HCC)   Essential hypertension, benign   Cerebrovascular accident (CVA) ([place])   Adjustment disorder with anxious mood   Mixed hyperlipidemia   Obstructive sleep apnea   Homeless    Assessment and Plan: * Acute renal failure superimposed on stage 3b chronic kidney disease ([place]) 51 year old female presenting with dizziness found to have acute on chronic renal failure vs. Worsening renal disease in setting of uncontrolled HTN and diabetes  Creatinine in December 2022: 2.1>june 2023: 2.9>today: 4.49 -obs to telemetry -gentle, time limited IVF -check renal US -urine studies, UA -strict I/O  -hold nephrotoxic drugs -needs nephrology outpatient vs. Inpatient if renal function worsens   Type 2 diabetes with complication (HCC) C3I of 9.2 in 01/2022 Noncompliant with medication, but is really trying hard to do the right thing  Sugars have been on lower end per patient Will hold her 70/30 for today and just do SSI and accucheck qac/hs  May need adjusted to lower dose with worsening renal function  Can likely add this back tomorrow if CBG stable.   Normocytic anemia Likely  ACD from CKD Check iron studies hbg at baseline   Prolonged QT interval Optimize electrolytes Keep on telemetry Avoid qt prolonging drugs  Repeat ekg in AM    Chronic heart failure with preserved ejection fraction (HCC) Euvolemic  Strict I/o Echo 12/22: EF of 55% with normal LVF. Indeterminate diastolic function. Moderate, grade 2 AR.   Essential hypertension, benign History of noncompliance with medication then took all of her medication today and was hypotensive and dizzy Hold medication for now in setting of soft blood pressures  Check orthostatics Prn hydralazine for now Add back home medication as pressures allow   Cerebrovascular accident (CVA) ([place]) Continue ASA and high dose statin   Adjustment disorder with anxious mood Continue buspar BID   Mixed hyperlipidemia Continue crestor   Obstructive sleep apnea cpap nightly   Homeless Social work consult     Advance Care Planning:   Code Status: Full Code   Consults: SW  DVT Prophylaxis: heparin   Family Communication: none   Severity of Illness: The appropriate patient status for this patient is OBSERVATION. Observation status is judged to be reasonable and necessary in order to provide the required intensity of service to ensure the patient's safety. The patient's presenting symptoms, physical exam findings, and initial radiographic and laboratory data in the context of their medical condition is felt to place them at decreased risk for further clinical deterioration. Furthermore, it is anticipated that the patient will be medically stable for discharge from the hospital within 2 midnights of admission.   Author: Orma Flaming, MD 04/20/2022 8:07 PM  For on call review [email or web address].

## 2022-04-20 NOTE — Progress Notes (Signed)
Pt set up on CPAP for night rest.  Auto titrate mode.  Tolerating well.

## 2022-04-21 ENCOUNTER — Ambulatory Visit: Payer: Medicaid Other

## 2022-04-21 DIAGNOSIS — F4322 Adjustment disorder with anxiety: Secondary | ICD-10-CM

## 2022-04-21 DIAGNOSIS — Z833 Family history of diabetes mellitus: Secondary | ICD-10-CM | POA: Diagnosis not present

## 2022-04-21 DIAGNOSIS — Z87891 Personal history of nicotine dependence: Secondary | ICD-10-CM | POA: Diagnosis not present

## 2022-04-21 DIAGNOSIS — E1122 Type 2 diabetes mellitus with diabetic chronic kidney disease: Secondary | ICD-10-CM | POA: Diagnosis present

## 2022-04-21 DIAGNOSIS — Z8249 Family history of ischemic heart disease and other diseases of the circulatory system: Secondary | ICD-10-CM | POA: Diagnosis not present

## 2022-04-21 DIAGNOSIS — N179 Acute kidney failure, unspecified: Secondary | ICD-10-CM | POA: Diagnosis present

## 2022-04-21 DIAGNOSIS — E11649 Type 2 diabetes mellitus with hypoglycemia without coma: Secondary | ICD-10-CM | POA: Diagnosis present

## 2022-04-21 DIAGNOSIS — E1165 Type 2 diabetes mellitus with hyperglycemia: Secondary | ICD-10-CM | POA: Diagnosis not present

## 2022-04-21 DIAGNOSIS — D631 Anemia in chronic kidney disease: Secondary | ICD-10-CM | POA: Diagnosis present

## 2022-04-21 DIAGNOSIS — Z794 Long term (current) use of insulin: Secondary | ICD-10-CM | POA: Diagnosis not present

## 2022-04-21 DIAGNOSIS — D638 Anemia in other chronic diseases classified elsewhere: Secondary | ICD-10-CM | POA: Diagnosis not present

## 2022-04-21 DIAGNOSIS — E782 Mixed hyperlipidemia: Secondary | ICD-10-CM | POA: Diagnosis present

## 2022-04-21 DIAGNOSIS — Z5900 Homelessness unspecified: Secondary | ICD-10-CM

## 2022-04-21 DIAGNOSIS — G4733 Obstructive sleep apnea (adult) (pediatric): Secondary | ICD-10-CM | POA: Diagnosis present

## 2022-04-21 DIAGNOSIS — I5032 Chronic diastolic (congestive) heart failure: Secondary | ICD-10-CM | POA: Diagnosis present

## 2022-04-21 DIAGNOSIS — Z89421 Acquired absence of other right toe(s): Secondary | ICD-10-CM | POA: Diagnosis not present

## 2022-04-21 DIAGNOSIS — I639 Cerebral infarction, unspecified: Secondary | ICD-10-CM | POA: Diagnosis not present

## 2022-04-21 DIAGNOSIS — I10 Essential (primary) hypertension: Secondary | ICD-10-CM

## 2022-04-21 DIAGNOSIS — R9431 Abnormal electrocardiogram [ECG] [EKG]: Secondary | ICD-10-CM

## 2022-04-21 DIAGNOSIS — Z825 Family history of asthma and other chronic lower respiratory diseases: Secondary | ICD-10-CM | POA: Diagnosis not present

## 2022-04-21 DIAGNOSIS — N184 Chronic kidney disease, stage 4 (severe): Secondary | ICD-10-CM | POA: Diagnosis present

## 2022-04-21 DIAGNOSIS — I959 Hypotension, unspecified: Secondary | ICD-10-CM | POA: Diagnosis present

## 2022-04-21 DIAGNOSIS — I130 Hypertensive heart and chronic kidney disease with heart failure and stage 1 through stage 4 chronic kidney disease, or unspecified chronic kidney disease: Secondary | ICD-10-CM | POA: Diagnosis present

## 2022-04-21 DIAGNOSIS — I2510 Atherosclerotic heart disease of native coronary artery without angina pectoris: Secondary | ICD-10-CM | POA: Diagnosis present

## 2022-04-21 DIAGNOSIS — Z8673 Personal history of transient ischemic attack (TIA), and cerebral infarction without residual deficits: Secondary | ICD-10-CM | POA: Diagnosis not present

## 2022-04-21 DIAGNOSIS — E113293 Type 2 diabetes mellitus with mild nonproliferative diabetic retinopathy without macular edema, bilateral: Secondary | ICD-10-CM | POA: Diagnosis present

## 2022-04-21 DIAGNOSIS — Z79899 Other long term (current) drug therapy: Secondary | ICD-10-CM | POA: Diagnosis not present

## 2022-04-21 LAB — GLUCOSE, CAPILLARY
Glucose-Capillary: 140 mg/dL — ABNORMAL HIGH (ref 70–99)
Glucose-Capillary: 159 mg/dL — ABNORMAL HIGH (ref 70–99)
Glucose-Capillary: 184 mg/dL — ABNORMAL HIGH (ref 70–99)
Glucose-Capillary: 128 mg/dL — ABNORMAL HIGH (ref 70–99)

## 2022-04-21 LAB — BASIC METABOLIC PANEL
Anion gap: 8 (ref 5–15)
BUN: 52 mg/dL — ABNORMAL HIGH (ref 6–20)
CO2: 22 mmol/L (ref 22–32)
Calcium: 8.3 mg/dL — ABNORMAL LOW (ref 8.9–10.3)
Chloride: 109 mmol/L (ref 98–111)
Creatinine, Ser: 4.19 mg/dL — ABNORMAL HIGH (ref 0.44–1.00)
GFR, Estimated: 12 mL/min — ABNORMAL LOW (ref >60)
Glucose, Bld: 189 mg/dL — ABNORMAL HIGH (ref 70–99)
Potassium: 3.3 mmol/L — ABNORMAL LOW (ref 3.5–5.1)
Sodium: 139 mmol/L (ref 135–145)

## 2022-04-21 LAB — CBC
HCT: 28.1 % — ABNORMAL LOW (ref 36.0–46.0)
Hemoglobin: 9.6 g/dL — ABNORMAL LOW (ref 12.0–15.0)
MCH: 30.3 pg (ref 26.0–34.0)
MCHC: 34.2 g/dL (ref 30.0–36.0)
MCV: 88.6 fL (ref 80.0–100.0)
Platelets: 216 10*3/uL (ref 150–400)
RBC: 3.17 MIL/uL — ABNORMAL LOW (ref 3.87–5.11)
RDW: 13.6 % (ref 11.5–15.5)
WBC: 7.9 10*3/uL (ref 4.0–10.5)
nRBC: 0 % (ref 0.0–0.2)

## 2022-04-21 MED ORDER — HYDRALAZINE HCL 25 MG PO TABS
25.0000 mg | ORAL_TABLET | Freq: Four times a day (QID) | ORAL | Status: DC | PRN
  Administered 2022-04-21: 25 mg via ORAL
  Filled 2022-04-21: qty 1

## 2022-04-21 MED ORDER — POTASSIUM CHLORIDE CRYS ER 20 MEQ PO TBCR
40.0000 meq | EXTENDED_RELEASE_TABLET | Freq: Once | ORAL | Status: AC
  Administered 2022-04-21: 40 meq via ORAL
  Filled 2022-04-21: qty 2

## 2022-04-21 MED ORDER — AMLODIPINE BESYLATE 10 MG PO TABS
10.0000 mg | ORAL_TABLET | Freq: Every day | ORAL | Status: DC
  Administered 2022-04-21 – 2022-04-23 (×3): 10 mg via ORAL
  Filled 2022-04-21 (×3): qty 1

## 2022-04-21 MED ORDER — CARVEDILOL 6.25 MG PO TABS: 6.2500 mg | ORAL_TABLET | Freq: Two times a day (BID) | ORAL | Status: DC

## 2022-04-21 MED ORDER — LABETALOL HCL 300 MG PO TABS: 300.0000 mg | ORAL_TABLET | Freq: Two times a day (BID) | ORAL | Status: DC

## 2022-04-21 MED ORDER — SODIUM CHLORIDE 0.9 % IV SOLN: INTRAVENOUS | Status: DC

## 2022-04-21 NOTE — Progress Notes (Signed)
Mobility Specialist Progress Note:   04/21/22 1130  Mobility  Activity Ambulated with assistance in hallway  Level of Assistance Standby assist, set-up cues, supervision of patient - no hands on  Assistive Device Other (Comment) (IV Pole)  Distance Ambulated (ft) 450 ft  Activity Response Tolerated well  $Mobility charge 1 Mobility   Pt agreeable to mobility session. Required no physical assist throughout however noted x1 significant LOB, pt able to correct. States this happens a lot and points out scars on knees from previous falls. Pt back in bed with all needs met, encouraged frequent OOB mobility.   Nelta Numbers Acute Rehab Secure Chat or Office Phone: [phone]

## 2022-04-21 NOTE — TOC Initial Note (Signed)
Transition of Care [facility]) - Initial/Assessment Note    Patient Details  Name: [name] MRN: [id number] Date of Birth: [birth year]  Transition of [facility]) CM/SW Contact:    Milinda Antis, [place] Phone Number: 04/21/2022, 3:22 PM  Clinical Narrative:                 CSW met with patient at bedside.  The patient reports homelessness and sleeping in a car after leaving her brother in [place] home due to inability to pay.  The patient already has a shelter list and has been calling different agencies weekly to inquire about bed availability.  None have availability as of yet.  CSW encourage the patient to continue to reach out to different agencies.  CSW contacted the [facility] to inquire about availability.  There was no answer and CSW was unable to leave a VM.  TOC will continue to follow.    Expected Discharge Plan: Home/Self Care Barriers to Discharge: Continued Medical Work up   Patient Goals and CMS Choice        Expected Discharge Plan and Services Expected Discharge Plan: Home/Self Care In-house Referral: Clinical Social Work     Living arrangements for the past 2 months: Single Family Home, Homeless                                      Prior Living Arrangements/Services Living arrangements for the past 2 months: [place], Homeless Lives with:: Self, Minor Children Patient language and need for interpreter reviewed:: Yes Do you feel safe going back to the place where you live?: No   see note  Need for Family Participation in Patient Care: No (Comment) Care giver support system in place?: No (comment)   Criminal Activity/Legal Involvement Pertinent to Current Situation/Hospitalization: No - Comment as needed  Activities of Daily Living Home Assistive Devices/Equipment: None ADL Screening (condition at time of admission) [name] cognitive ability adequate to safely complete daily activities?: Yes Is the patient deaf or have difficulty hearing?:  No Does the patient have difficulty seeing, even when wearing glasses/contacts?: No Does the patient have difficulty concentrating, remembering, or making decisions?: No Patient able to express need for assistance with ADLs?: Yes Does the patient have difficulty dressing or bathing?: No Independently performs ADLs?: Yes (appropriate for developmental age) Does the patient have difficulty walking or climbing stairs?: No Weakness of Legs: Both Weakness of Arms/Hands: None  Permission Sought/Granted   Permission granted to share information with : Yes, Verbal Permission Granted     Permission granted to share info w AGENCY: homelessness assistance facilities        Emotional Assessment Appearance:: Appears older than stated age Attitude/Demeanor/Rapport: Engaged Affect (typically observed): Appropriate Orientation: : Oriented to Situation, Oriented to  Time, Oriented to Place, Oriented to Self Alcohol / Substance Use: Not Applicable Psych Involvement: No (comment)  Admission diagnosis:  AKI (acute kidney injury) ([place]) [N17.9] Acute renal failure superimposed on stage 3b chronic kidney disease ([place]) [N17.9, N18.32] Patient Active Problem List   Diagnosis Date Noted   Acute renal failure superimposed on stage 3b chronic kidney disease ([place]) 04/20/2022   Prolonged QT interval 04/20/2022   Anemia of chronic disease 04/20/2022   Homeless 04/20/2022   Preop cardiovascular exam 01/10/2022   Adjustment disorder with anxious mood 10/26/2021   Allergic rhinitis due to pollen 10/26/2021   Anxiety 10/26/2021   Dysmenorrhea  10/26/2021   Menorrhagia 10/26/2021   Mild intermittent asthma 10/26/2021   Stage 3 chronic kidney disease ([place]) 10/26/2021   Tobacco abuse 10/26/2021   Vitamin D deficiency 10/26/2021   Chronic osteomyelitis of right foot with draining sinus ([place])    Diabetic foot ulcer ([employer]) 05/27/2021   PAC (premature atrial contraction) 02/15/2021   Symptomatic anemia 06/25/2020    Chronic heart failure with preserved ejection fraction ([place]) 06/25/2020   Morbid obesity ([place]) 03/30/2020   NSVT (nonsustained ventricular tachycardia) ([place]) 11/13/2019   Pericardial effusion 09/17/2019   H/O: stroke 04/09/2019   Left ventricular hypertrophy 04/09/2019   Mixed hyperlipidemia 04/08/2019   Non compliance w medication regimen 02/20/2019   Resistant hypertension    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[place]) 02/13/2019   Cerebrovascular accident (CVA) ([place])    Acute CVA (cerebrovascular accident) ([place]) 02/11/2019   Hypertensive urgency 02/11/2019   ARF (acute renal failure) ([place]) 02/11/2019   Type 2 diabetes mellitus with stage 4 chronic kidney disease, with long-term current use of insulin ([place]) 02/11/2019   Enlargement of thyroid 07/13/2018   Uncontrolled type 2 diabetes mellitus with hyperglycemia, with long-term current use of insulin ([place]) 04/12/2018   Uncontrolled hypertension 01/08/2018   Obstructive sleep apnea 08/26/2017   PCP:  [name], MD Pharmacy:   [facility], [place], [place] [address] Phone: [phone] Fax: [phone]  [id number] [place], [id number]: [phone]: [phone] [address] [id number]: [phone]: [phone]    Social Determinants of Health (SDOH) Interventions Housing Interventions: Inpatient TOC  Readmission Risk Interventions     No data to display

## 2022-04-21 NOTE — Consult Note (Signed)
[facility]  HISTORY AND PHYSICAL  [name] is an 51 y.o. female.    Chief Complaint: AKI, hypotension  HPI: Pt is a 5F with CKD Stage IV, DM II, HTN, HLD, and h/o pseudotumor who is now seen in consultation at the request of Dr. Cyndia Skeeters for evaluation and recommendations surrounding AKI on CKD IV.    Pt was seen by Dr Moshe Cipro in clinic 04/14/22.  She hadn't taken any BP meds for the past several weeks and then took all BP meds the day before the appt.  In office BP 80/ 40.  She was told to stop all meds for the rest of that day and then restart clonidine patch and amlodipine/ benazepril combo pill.  She did so.  Medications that were held include labetalol, BiDil, and Lasix.  However, she continued to feel dizzy with presyncope and blurry vision.  She saw her primary doctor yesterday and was still hypotensive per report.  She was transported to ED.    EKG and CT head were negative.  Cr was up to 4.49, baseline 2.9 (eGFR 19%).  In this setting we are asked to see.    She is on IVFs at present.  On clonidine patch and amlodipine.  BP is ranging from 737-106 systolic.    PMH: Past Medical History:  Diagnosis Date   Arthritis    Asthma    Cataract    Mixed form OU   CKD (chronic kidney disease)    Coronary artery disease    Diabetes mellitus    Diabetic retinopathy ([place])    NPDR OU   Hyperlipidemia    Hypertension    Hypertensive retinopathy    OU   Left thyroid nodule    diagnosed 07/2018   PAC (premature atrial contraction) 02/15/2021   Pseudotumor cerebri    Stroke ([place])    Vitamin D deficiency    PSH: Past Surgical History:  Procedure Laterality Date   ACHILLES TENDON REPAIR Right    ACHILLES TENDON SURGERY Left 02/19/2021   Procedure: ACHILLES TENDON REPAIR WITH GRAFT;  Surgeon: Felipa Furnace, DPM;  Location: [facility];  Service: Podiatry;  Laterality: Left;  BLOCK   AMPUTATION TOE Right 05/28/2021   Procedure: AMPUTATION TOE;   Surgeon: Lorenda Peck, MD;  Location: [facility];  Service: Podiatry;  Laterality: Right;  Surgical team will do block   GASTROC RECESSION EXTREMITY Left 02/19/2021   Procedure: GASTROC RECESSION EXTREMITY;  Surgeon: Felipa Furnace, DPM;  Location: [facility];  Service: Podiatry;  Laterality: Left;  Block   TUBAL LIGATION       Past Medical History:  Diagnosis Date   Arthritis    Asthma    Cataract    Mixed form OU   CKD (chronic kidney disease)    Coronary artery disease    Diabetes mellitus    Diabetic retinopathy ([facility])    NPDR OU   Hyperlipidemia    Hypertension    Hypertensive retinopathy    OU   Left thyroid nodule    diagnosed 07/2018   PAC (premature atrial contraction) 02/15/2021   Pseudotumor cerebri    Stroke ([facility])    Vitamin D deficiency     Medications:  Scheduled:  amLODipine  10 mg Oral Daily   aspirin EC  81 mg Oral Daily   busPIRone  5 mg Oral BID   fenofibrate  54 mg Oral Daily   heparin  5,000 Units Subcutaneous Q8H  insulin aspart  0-9 Units Subcutaneous TID WC   rosuvastatin  40 mg Oral Daily    Medications Prior to Admission  Medication Sig Dispense Refill   albuterol (VENTOLIN HFA) 108 (90 Base) MCG/ACT inhaler Inhale 1-2 puffs into the lungs every 6 (six) hours as needed for wheezing or shortness of breath.     amLODipine-benazepril (LOTREL) 10-40 MG capsule Take 1 capsule by mouth daily.     aspirin 81 MG EC tablet Take 1 tablet (81 mg total) by mouth daily. 90 tablet 2   busPIRone (BUSPAR) 5 MG tablet Take 5 mg by mouth 2 (two) times daily.     EPINEPHrine 0.3 mg/0.3 mL IJ SOAJ injection Inject 0.3 mg into the muscle as needed for anaphylaxis. 2 each 0   fenofibrate (TRICOR) 48 MG tablet Take 48 mg by mouth daily.     furosemide (LASIX) 40 MG tablet Take 1 tablet (40 mg total) by mouth daily. 120 tablet 3   glipiZIDE (GLUCOTROL XL) 5 MG 24 hr tablet Take 5 mg by mouth daily with breakfast.     hydrOXYzine (VISTARIL) 25 MG  capsule Take 25 mg by mouth at bedtime.     insulin aspart protamine - aspart (NOVOLOG 70/30 MIX) (70-30) 100 UNIT/ML FlexPen Inject 20 Units into the skin 2 (two) times daily with a meal.     isosorbide-hydrALAZINE (BIDIL) 20-37.5 MG tablet Take 2 tablets by mouth 3 (three) times daily. 540 tablet 3   labetalol (NORMODYNE) 300 MG tablet Take 300 mg by mouth 2 (two) times daily.     rosuvastatin (CRESTOR) 40 MG tablet Take 40 mg by mouth daily.     Accu-Chek FastClix Lancets MISC 4 (four) times daily.     ACCU-CHEK GUIDE test strip 1 each by Other route See admin instructions. for testing 100 each 2   blood glucose meter kit and supplies KIT Dispense based on patient and insurance preference. Use up to four times daily as directed. (FOR ICD-9 250.00, 250.01). 1 each 0   cloNIDine (CATAPRES - DOSED IN MG/24 HR) 0.3 mg/24hr patch Place 1 patch (0.3 mg total) onto the skin once a week. (Patient taking differently: Place 0.3 mg onto the skin every Wednesday.) 4 patch 12   Continuous Blood Gluc Receiver (DEXCOM G7 RECEIVER) DEVI Use to monitor BG continuously 1 each 0   Continuous Blood Gluc Sensor (DEXCOM G7 SENSOR) MISC Change sensor every 10 days 3 each 2   fluticasone (FLONASE) 50 MCG/ACT nasal spray Place 2 sprays into both nostrils daily as needed for allergies or rhinitis. (Patient not taking: Reported on 04/20/2022)     Insulin Syringes, Disposable, A-540 1 ML MISC 1 application by Does not apply route 2 (two) times a day. 100 each 0   ipratropium-albuterol (DUONEB) 0.5-2.5 (3) MG/3ML SOLN Take 3 mLs by nebulization every 4 (four) hours as needed. (Patient not taking: Reported on 03/30/2022) 360 mL 0   methocarbamol (ROBAXIN) 750 MG tablet Take 1 tablet (750 mg total) by mouth every 8 (eight) hours as needed (muscle spasm/pain). (Patient not taking: Reported on 03/30/2022) 15 tablet 0    ALLERGIES:   Allergies  Allergen Reactions   Bee Pollen Other (See Comments)    Seasonal Allergies   Dust  Mite Extract Cough    Sneezing & Cough   Mixed Ragweed Itching    FAM HX: Family History  Problem Relation Age of Onset   Asthma Mother    Diabetes Father    Hypertension Father  Kidney disease Father    Heart attack Father    Heart failure Father    Hypertension Sister    Asthma Sister    Congestive Heart Failure Maternal Aunt    Diabetes Maternal Grandmother    Congestive Heart Failure Maternal Grandmother    Lung disease Maternal Grandmother    Asthma Other    Hyperlipidemia Other    Hypertension Other    Cancer Other     Social History:   reports that she quit smoking about 6 years ago. Her smoking use included cigarettes. She has a 15.00 pack-year smoking history. She has never used smokeless tobacco. She reports that she does not currently use alcohol. She reports current drug use. Drug: Marijuana.  ROS: ROS: all other systems reviewed and are negative except as per HPI  Blood pressure (!) 179/90, pulse 78, temperature 98 F (36.7 C), temperature source Oral, resp. rate 18, height '5\' 9"'  (1.753 m), weight 108.5 kg, SpO2 98 %. PHYSICAL EXAM: Physical Exam  GEN NAD, sitting in bed, crocheting HEENT EOMI PERRL NECK no JVD PULM clear bilaterally CV RRR no m/r/g ABD NABS EXT no LE edema NEURO AAO x 3 no gross focal deficits SKIN no rashes or lesions MSK no effusions  Results for orders placed or performed during the hospital encounter of 04/20/22 (from the past 48 hour(s))  CBG monitoring, ED     Status: Abnormal   Collection Time: 04/20/22  1:42 PM  Result Value Ref Range   Glucose-Capillary 105 (H) 70 - 99 mg/dL    Comment: Glucose reference range applies only to samples taken after fasting for at least 8 hours.  Basic metabolic panel     Status: Abnormal   Collection Time: 04/20/22  2:07 PM  Result Value Ref Range   Sodium 139 135 - 145 mmol/L   Potassium 4.0 3.5 - 5.1 mmol/L   Chloride 108 98 - 111 mmol/L   CO2 20 (L) 22 - 32 mmol/L   Glucose, Bld 89  70 - 99 mg/dL    Comment: Glucose reference range applies only to samples taken after fasting for at least 8 hours.   BUN 53 (H) 6 - 20 mg/dL   Creatinine, Ser 4.49 (H) 0.44 - 1.00 mg/dL   Calcium 8.6 (L) 8.9 - 10.3 mg/dL   GFR, Estimated 11 (L) >60 mL/min    Comment: (NOTE) Calculated using the CKD-EPI Creatinine Equation (2021)    Anion gap 11 5 - 15    Comment: Performed at [facility] [address], [place], [place] [address]  CBC with Differential     Status: Abnormal   Collection Time: 04/20/22  2:07 PM  Result Value Ref Range   WBC 9.7 4.0 - 10.5 K/uL   RBC 3.45 (L) 3.87 - 5.11 MIL/uL   Hemoglobin 10.3 (L) 12.0 - 15.0 g/dL   HCT 30.8 (L) 36.0 - 46.0 %   MCV 89.3 80.0 - 100.0 fL   MCH 29.9 26.0 - 34.0 pg   MCHC 33.4 30.0 - 36.0 g/dL   RDW 13.5 11.5 - 15.5 %   Platelets 252 150 - 400 K/uL   nRBC 0.0 0.0 - 0.2 %   Neutrophils Relative % 83 %   Neutro Abs 8.0 (H) 1.7 - 7.7 K/uL   Lymphocytes Relative 11 %   Lymphs Abs 1.1 0.7 - 4.0 K/uL   Monocytes Relative 4 %   Monocytes Absolute 0.4 0.1 - 1.0 K/uL   Eosinophils Relative 2 %  Eosinophils Absolute 0.2 0.0 - 0.5 K/uL   Basophils Relative 0 %   Basophils Absolute 0.0 0.0 - 0.1 K/uL   Immature Granulocytes 0 %   Abs Immature Granulocytes 0.04 0.00 - 0.07 K/uL    Comment: Performed at [facility], [place] [address] [place], [place] [address]  TSH     Status: None   Collection Time: 04/20/22  2:07 PM  Result Value Ref Range   TSH 1.115 0.350 - 4.500 uIU/mL    Comment: Performed by a 3rd Generation assay with a functional sensitivity of <=0.01 uIU/mL. Performed at [facility], [place] [address] [place], [place] [address]   Hepatic function panel     Status: Abnormal   Collection Time: 04/20/22  2:07 PM  Result Value Ref Range   Total Protein 7.1 6.5 - 8.1 g/dL   Albumin 3.4 (L) 3.5 - 5.0 g/dL   AST 14 (L) 15 - 41 U/L   ALT 11 0 - 44 U/L   Alkaline Phosphatase 50 38 - 126 U/L   Total Bilirubin 0.5  0.3 - 1.2 mg/dL   Bilirubin, Direct 0.1 0.0 - 0.2 mg/dL   Indirect Bilirubin 0.4 0.3 - 0.9 mg/dL    Comment: Performed at [facility] [address] [place], [place] [address]  I-Stat beta hCG blood, ED     Status: None   Collection Time: 04/20/22  2:14 PM  Result Value Ref Range   I-stat hCG, quantitative <5.0 <5 mIU/mL   Comment 3            Comment:   GEST. AGE      CONC.  (mIU/mL)   <=1 WEEK        5 - 50     2 WEEKS       50 - 500     3 WEEKS       100 - 10,000     4 WEEKS     1,000 - 30,000        FEMALE AND NON-PREGNANT FEMALE:     LESS THAN 5 mIU/mL   Ferritin     Status: None   Collection Time: 04/20/22  5:54 PM  Result Value Ref Range   Ferritin 83 11 - 307 ng/mL    Comment: Performed at [facility], [place] [address], [place], [place] [address]  Iron and TIBC     Status: None   Collection Time: 04/20/22  5:54 PM  Result Value Ref Range   Iron 39 28 - 170 ug/dL   TIBC 365 250 - 450 ug/dL   Saturation Ratios 11 10.4 - 31.8 %   UIBC 326 ug/dL    Comment: Performed at [facility], [place] [address], [place],  [address]  Magnesium     Status: None   Collection Time: 04/20/22  5:54 PM  Result Value Ref Range   Magnesium 1.9 1.7 - 2.4 mg/dL    Comment: Performed at [facility], [place] [address] [place], [place] [address]  Glucose, capillary     Status: Abnormal   Collection Time: 04/20/22  6:20 PM  Result Value Ref Range   Glucose-Capillary 44 (LL) 70 - 99 mg/dL    Comment: Glucose reference range applies only to samples taken after fasting for at least 8 hours.   Comment 1 Notify RN   Glucose, capillary     Status: Abnormal   Collection Time: 04/20/22  6:56  PM  Result Value Ref Range   Glucose-Capillary 54 (L) 70 - 99 mg/dL    Comment: Glucose reference range applies only to samples taken after fasting for at least 8 hours.  Glucose, capillary     Status: None   Collection Time: 04/20/22  7:16 PM  Result Value Ref Range    Glucose-Capillary 80 70 - 99 mg/dL    Comment: Glucose reference range applies only to samples taken after fasting for at least 8 hours.  Urinalysis, Routine w reflex microscopic Urine, Random     Status: Abnormal   Collection Time: 04/20/22 10:02 PM  Result Value Ref Range   Color, Urine YELLOW YELLOW   APPearance CLEAR CLEAR   Specific Gravity, Urine 1.012 1.005 - 1.030   pH 5.0 5.0 - 8.0   Glucose, UA NEGATIVE NEGATIVE mg/dL   Hgb urine dipstick NEGATIVE NEGATIVE   Bilirubin Urine NEGATIVE NEGATIVE   Ketones, ur NEGATIVE NEGATIVE mg/dL   Protein, ur 100 (A) NEGATIVE mg/dL   Nitrite NEGATIVE NEGATIVE   Leukocytes,Ua NEGATIVE NEGATIVE   RBC / HPF 0-5 0 - 5 RBC/hpf   WBC, UA 0-5 0 - 5 WBC/hpf   Bacteria, UA RARE (A) NONE SEEN   Squamous Epithelial / LPF 0-5 0 - 5   Mucus PRESENT     Comment: Performed at [facility], [place] [address], [place], [place] [address]  Rapid urine drug screen (hospital performed)     Status: None   Collection Time: 04/20/22 10:02 PM  Result Value Ref Range   Opiates NONE DETECTED NONE DETECTED   Cocaine NONE DETECTED NONE DETECTED   Benzodiazepines NONE DETECTED NONE DETECTED   Amphetamines NONE DETECTED NONE DETECTED   Tetrahydrocannabinol NONE DETECTED NONE DETECTED   Barbiturates NONE DETECTED NONE DETECTED    Comment: (NOTE) DRUG SCREEN FOR MEDICAL PURPOSES ONLY.  IF CONFIRMATION IS NEEDED FOR ANY PURPOSE, NOTIFY LAB WITHIN 5 DAYS.  LOWEST DETECTABLE LIMITS FOR URINE DRUG SCREEN Drug Class                     Cutoff (ng/mL) Amphetamine and metabolites    1000 Barbiturate and metabolites    200 Benzodiazepine                 591 Tricyclics and metabolites     300 Opiates and metabolites        300 Cocaine and metabolites        300 THC                            50 Performed at [facility], [place] [address], [place], [place] [address]   Sodium, urine, random     Status: None   Collection Time: 04/20/22 10:02 PM  Result Value  Ref Range   Sodium, Ur 56 mmol/L    Comment: Performed at [facility] [address] [place], [place] [address]  Creatinine, urine, random     Status: None   Collection Time: 04/20/22 10:02 PM  Result Value Ref Range   Creatinine, Urine 96 mg/dL    Comment: Performed at [facility] [address], [place],  [address]  Basic metabolic panel     Status: Abnormal   Collection Time: 04/21/22  3:27 AM  Result Value Ref Range   Sodium 139 135 - 145 mmol/L   Potassium 3.3 (L) 3.5 - 5.1 mmol/L  Chloride 109 98 - 111 mmol/L   CO2 22 22 - 32 mmol/L   Glucose, Bld 189 (H) 70 - 99 mg/dL    Comment: Glucose reference range applies only to samples taken after fasting for at least 8 hours.   BUN 52 (H) 6 - 20 mg/dL   Creatinine, Ser 4.19 (H) 0.44 - 1.00 mg/dL   Calcium 8.3 (L) 8.9 - 10.3 mg/dL   GFR, Estimated 12 (L) >60 mL/min    Comment: (NOTE) Calculated using the CKD-EPI Creatinine Equation (2021)    Anion gap 8 5 - 15    Comment: Performed at [facility] [address], [place], [place] [address]  CBC     Status: Abnormal   Collection Time: 04/21/22  3:27 AM  Result Value Ref Range   WBC 7.9 4.0 - 10.5 K/uL   RBC 3.17 (L) 3.87 - 5.11 MIL/uL   Hemoglobin 9.6 (L) 12.0 - 15.0 g/dL   HCT 28.1 (L) 36.0 - 46.0 %   MCV 88.6 80.0 - 100.0 fL   MCH 30.3 26.0 - 34.0 pg   MCHC 34.2 30.0 - 36.0 g/dL   RDW 13.6 11.5 - 15.5 %   Platelets 216 150 - 400 K/uL   nRBC 0.0 0.0 - 0.2 %    Comment: Performed at [facility], [place] [address], [place], [place] [address]  Glucose, capillary     Status: Abnormal   Collection Time: 04/21/22  7:35 AM  Result Value Ref Range   Glucose-Capillary 140 (H) 70 - 99 mg/dL    Comment: Glucose reference range applies only to samples taken after fasting for at least 8 hours.  Glucose, capillary     Status: Abnormal   Collection Time: 04/21/22 11:38 AM  Result Value Ref Range   Glucose-Capillary 159 (H) 70 - 99 mg/dL    Comment:  Glucose reference range applies only to samples taken after fasting for at least 8 hours.    US RENAL  Result Date: 04/20/2022 CLINICAL DATA:  Renal failure EXAM: RENAL / URINARY TRACT ULTRASOUND COMPLETE COMPARISON:  None Available. FINDINGS: Right Kidney: Renal measurements: 11.4 x 4.3 x 5.3 cm = volume: 135 mL. Echogenicity within normal limits. No mass or hydronephrosis visualized. Left Kidney: Renal measurements: 11.1 x 6.1 x 5.4 cm = volume: 187.8 mL. Echogenicity within normal limits. No mass visualized. Mild pelviectasis. Bladder: Appears normal for degree of bladder distention. Other: None. IMPRESSION: Mild left renal pelviectasis. Electronically Signed   By: Ulyses Jarred M.D.   On: 04/20/2022 19:07   CT Head Wo Contrast  Result Date: 04/20/2022 CLINICAL DATA:  Dizziness EXAM: CT HEAD WITHOUT CONTRAST TECHNIQUE: Contiguous axial images were obtained from the base of the skull through the vertex without intravenous contrast. RADIATION DOSE REDUCTION: This exam was performed according to the departmental dose-optimization program which includes automated exposure control, adjustment of the mA and/or kV according to patient size and/or use of iterative reconstruction technique. COMPARISON:  CT brain 09/30/2021, 10/08/2019 FINDINGS: Brain: No acute territorial infarction, hemorrhage or intracranial mass. Mild white matter hypodensity consistent with chronic small vessel ischemic change. Small chronic white matter and anterior callosum infarcts. Stable ventricle size. Vascular: No hyperdense vessels.  Carotid vascular calcification Skull: Normal. Negative for fracture or focal lesion. Sinuses/Orbits: No acute finding. Other: None IMPRESSION: 1. No CT evidence for acute intracranial abnormality. 2. Chronic small vessel ischemic changes of the white matter with multiple small chronic appearing infarcts. Electronically Signed   By:  Donavan Foil M.D.   On: 04/20/2022 15:40    Assessment/Plan  AKI on  CKD IV: hemodynamic in nature with hypotensive insult.  She is making urine and there is no hydronephrosis- but mild L pelviaectasis but is likely not the primary driver of the AKI.  UA is bland  - pt appears volume replete- stop IVFs  - on amlodipine and clonidine patch  - anticipate restarting labetalol in the AM- have ordered for 10 am  - would continue to hold BiDil, Lasix, and ACEi, will add back gradually as appropriate and as renal function continues to improve  - no indication for dialysis at this point  - already has October followup with Dr Moshe Cipro per pt  2.   Hypotension  - improving, likely d/t spotty medication adherence  - discussed importance of consistent medication consumption  3.  DM II  - per primary  4.  HLD  - on atorvastatin  5.  Dispo: admitted  Madelon Lips 04/21/2022, 4:05 PM

## 2022-04-21 NOTE — Plan of Care (Signed)
  Problem: Nutritional: Goal: Maintenance of adequate nutrition will improve Outcome: Progressing   Problem: Clinical Measurements: Goal: Respiratory complications will improve Outcome: Progressing Goal: Cardiovascular complication will be avoided Outcome: Progressing   Problem: Activity: Goal: Risk for activity intolerance will decrease Outcome: Progressing   Problem: Nutrition: Goal: Adequate nutrition will be maintained Outcome: Progressing   Problem: Elimination: Goal: Will not experience complications related to bowel motility Outcome: Progressing Goal: Will not experience complications related to urinary retention Outcome: Progressing

## 2022-04-21 NOTE — Progress Notes (Addendum)
Inpatient Diabetes Program Recommendations  AACE/ADA: New Consensus Statement on Inpatient Glycemic Control (2015)  Target Ranges:  Prepandial:   less than 140 mg/dL      Peak postprandial:   less than 180 mg/dL (1-2 hours)      Critically ill patients:  140 - 180 mg/dL   Lab Results  Component Value Date   GLUCAP 140 (H) 04/21/2022   HGBA1C 9.6 (H) 01/13/2022    Review of Glycemic Control  Latest Reference Range & Units 04/20/22 18:56 04/20/22 19:16 04/21/22 07:35  Glucose-Capillary 70 - 99 mg/dL 54 (L) 80 140 (H)    Diabetes history: DM Outpatient Diabetes medications:  Glucotrol 5 mg with breakfast Novolog 70/30 20 units bid Current orders for Inpatient glycemic control:  Novolog 0-9 units tid with meals  Inpatient Diabetes Program Recommendations:   Consider reducing Novolog correction to very sensitive (0-6 units) tid with meals.   Will follow.    Thanks,  Adah Perl, RN, BC-ADM Inpatient Diabetes Coordinator Pager [phone]  (8a-5p)

## 2022-04-21 NOTE — Progress Notes (Signed)
Pt stated she could place self on cpap machine when ready for bed.

## 2022-04-21 NOTE — Progress Notes (Signed)
PROGRESS NOTE  [name] OJJ:[id number] DOB: September 16, 1970   PCP: [name], MD  Patient is from: Homeless  DOA: 04/20/2022 LOS: 0  Chief complaints Chief Complaint  Patient presents with   Hypertension   Dizziness     Brief Narrative / Interim history: 51 year old F with PMH of HBZ-1I, CAD, diastolic CHF, DM-2, HTN, CVA, asthma, pseudotumor cerebri, OSA on CPAP and noncompliance with medication directed to ED from PCP office with hypotension after she went to see PCP due to dizziness, blurry vision, headache and gait dysfunction.  Apparently, patient was not compliant with her medication and took all her pills that morning.  In ED, vitals stable. Cr 4.5 baseline about 2 but it was 2.9 on 6/8).  BUN 53.  Hgb 10.3 (baseline).  CT head without acute finding.  She was given IV fluid.  Hospitalist service consulted for admission for AKI.  Renal US with mild left renal pelviectasis.  UA and UDS without significant finding.  Subjective: Seen and examined earlier this morning.  No major events overnight of this morning.  No complaints.  Reports making good amount of urine but she has not been measuring.  Denies chest pain, dyspnea, GI or UTI symptoms.  Denies dizziness, palpitation, focal weakness, numbness or tingling.  Denies vision change.  Objective: Vitals:   04/20/22 2008 04/20/22 2300 04/21/22 0425 04/21/22 0900  BP: (!) 161/88 (!) 161/88 (!) 177/88 (!) 179/90  Pulse: 76 78 70 78  Resp: 20 19 18 18   Temp: 98 F (36.7 C)  98.4 F (36.9 C) 98 F (36.7 C)  TempSrc: Oral  Oral Oral  SpO2: 100% 99% 98% 98%  Weight:      Height:        Examination:  GENERAL: No apparent distress.  Nontoxic. HEENT: MMM.  Vision and hearing grossly intact.  NECK: Supple.  No apparent JVD.  RESP:  No IWOB.  Fair aeration bilaterally. CVS:  RRR. Heart sounds normal.  ABD/GI/GU: BS+. Abd soft, NTND.  MSK/EXT:  Moves extremities. No apparent deformity. No edema.  SKIN: no apparent  skin lesion or wound NEURO: Awake, alert and oriented appropriately.  No apparent focal neuro deficit. PSYCH: Calm. Normal affect.   Procedures:  None  Microbiology summarized: None  Assessment and plan: Principal Problem:   Acute renal failure superimposed on stage 3b chronic kidney disease (HCC) Active Problems:   Uncontrolled type 2 diabetes mellitus with hyperglycemia, with long-term current use of insulin (HCC)   Anemia of chronic disease   Prolonged QT interval   Chronic heart failure with preserved ejection fraction (HCC)   Uncontrolled hypertension   Cerebrovascular accident (CVA) ([place])   Adjustment disorder with anxious mood   Mixed hyperlipidemia   Obstructive sleep apnea   Homeless   Morbid obesity ([place])  AKI on CKD-3B/azotemia: Cannot rule out progressive CKD.  Followed by [facility], Dr. Moshe Cipro.  Could be mediated by diabetes, hypotension or uncontrolled hypertension.  Takes Lotrel and Lasix but not compliant.  Denies NSAID use.  UA without significant finding.  UDS negative. Renal US with mild left renal pelviectasis.  Some improvement with IV fluid Recent Labs    05/27/21 1458 05/28/21 0219 05/28/21 1431 05/29/21 0150 05/30/21 0139 07/21/21 1403 07/27/21 1242 01/13/22 0942 04/20/22 1407 04/21/22 0327  BUN 17 23* 19 20 23* 19 21 21* 53* 52*  CREATININE 1.76* 1.96* 1.70* 2.01* 2.03* 1.94* 2.13* 2.92* 4.49* 4.19*  -Hold nephrotoxic meds -Continue IV fluid -Strict intake and output -Nephrology consult  Uncontrolled IDDM-2 with hypoglycemia, hyperglycemia and CKD-3B: A1c 9.6% on 01/13/2022. Recent Labs  Lab 04/20/22 1820 04/20/22 1856 04/20/22 1916 04/21/22 0735 04/21/22 1138  GLUCAP 44* 54* 80 140* 159*  -Continue SSI-sensitive -Continue statin. -Recheck hemoglobin A1c  Hypotension/near syncope/blurry vision/headache: Likely iatrogenic from antihypertensive meds.  CT head without acute finding.  Resolved. -Resume antihypertensive meds slowly    Anemia of renal disease: Anemia panel with some degree of iron deficiency.  Stable. Recent Labs    05/27/21 1458 05/28/21 0219 05/29/21 0150 05/30/21 0139 07/21/21 1403 01/13/22 0942 04/20/22 1407 04/21/22 0327  HGB 12.4 10.5* 10.8* 10.3* 10.7* 10.4* 10.3* 9.6*  -Monitor   Prolonged QT interval: QTc 519. -Continue telemetry monitoring -Minimize or avoid QT prolonging drugs -Optimize K and Mg  Chronic diastolic CHF: Appears euvolemic on exam.  No cardiopulmonary symptoms.  TTE in 12/22 with LVEF of 55%, indeterminate DD, moderate grade 2 AR.  On p.o. Lasix at home. -Monitor fluid status and respiratory status while on IV fluid   Uncontrolled hypertension: Probably due to noncompliance with medication.  She also has sleep apnea but using CPAP. -Resume amlodipine and labetalol -Continue holding benazepril and BiDil -P.o. hydralazine as needed   Cerebrovascular accident: Chronic and stable. -Continue ASA and high dose statin    Adjustment disorder with anxious mood -Continue buspar BID but not great choice with renal failure   Mixed hyperlipidemia Continue crestor    Obstructive sleep apnea cpap nightly    Homeless Social work consult   Hypokalemia: K3.3. -P.o. KCl 40x1  Morbid obesity Body mass index is 35.32 kg/m.           DVT prophylaxis:  heparin injection 5,000 Units Start: 04/20/22 1700  Code Status: Full code Family Communication: None at bedside Level of care: Telemetry Medical Status is: Inpatient Remains inpatient appropriate because: AKI   Final disposition: Likely home Consultants:  Nephrology  Sch Meds:  Scheduled Meds:  amLODipine  10 mg Oral Daily   aspirin EC  81 mg Oral Daily   busPIRone  5 mg Oral BID   carvedilol  6.25 mg Oral BID WC   fenofibrate  54 mg Oral Daily   heparin  5,000 Units Subcutaneous Q8H   insulin aspart  0-9 Units Subcutaneous TID WC   rosuvastatin  40 mg Oral Daily   Continuous Infusions:  sodium  chloride 75 mL/hr at 04/21/22 1056   PRN Meds:.acetaminophen **OR** acetaminophen, hydrALAZINE  Antimicrobials: Anti-infectives (From admission, onward)    None        I have personally reviewed the following labs and images: CBC: Recent Labs  Lab 04/20/22 1407 04/21/22 0327  WBC 9.7 7.9  NEUTROABS 8.0*  --   HGB 10.3* 9.6*  HCT 30.8* 28.1*  MCV 89.3 88.6  PLT 252 216   BMP &GFR Recent Labs  Lab 04/20/22 1407 04/20/22 1754 04/21/22 0327  NA 139  --  139  K 4.0  --  3.3*  CL 108  --  109  CO2 20*  --  22  GLUCOSE 89  --  189*  BUN 53*  --  52*  CREATININE 4.49*  --  4.19*  CALCIUM 8.6*  --  8.3*  MG  --  1.9  --    Estimated Creatinine Clearance: 21.3 mL/min (A) (by C-G formula based on SCr of 4.19 mg/dL (H)). Liver & Pancreas: Recent Labs  Lab 04/20/22 1407  AST 14*  ALT 11  ALKPHOS 50  BILITOT 0.5  PROT 7.1  ALBUMIN 3.4*   No results for input(s): "LIPASE", "AMYLASE" in the last 168 hours. No results for input(s): "AMMONIA" in the last 168 hours. Diabetic: No results for input(s): "HGBA1C" in the last 72 hours. Recent Labs  Lab 04/20/22 1820 04/20/22 1856 04/20/22 1916 04/21/22 0735 04/21/22 1138  GLUCAP 44* 54* 80 140* 159*   Cardiac Enzymes: No results for input(s): "CKTOTAL", "CKMB", "CKMBINDEX", "TROPONINI" in the last 168 hours. No results for input(s): "PROBNP" in the last 8760 hours. Coagulation Profile: No results for input(s): "INR", "PROTIME" in the last 168 hours. Thyroid Function Tests: Recent Labs    04/20/22 1407  TSH 1.115   Lipid Profile: No results for input(s): "CHOL", "HDL", "LDLCALC", "TRIG", "CHOLHDL", "LDLDIRECT" in the last 72 hours. Anemia Panel: Recent Labs    04/20/22 1754  FERRITIN 83  TIBC 365  IRON 39   Urine analysis:    Component Value Date/Time   COLORURINE YELLOW 04/20/2022 2202   APPEARANCEUR CLEAR 04/20/2022 2202   LABSPEC 1.012 04/20/2022 2202   PHURINE 5.0 04/20/2022 2202   GLUCOSEU  NEGATIVE 04/20/2022 2202   HGBUR NEGATIVE 04/20/2022 2202   BILIRUBINUR NEGATIVE 04/20/2022 2202   KETONESUR NEGATIVE 04/20/2022 2202   PROTEINUR 100 (A) 04/20/2022 2202   UROBILINOGEN 0.2 08/25/2011 2043   NITRITE NEGATIVE 04/20/2022 2202   LEUKOCYTESUR NEGATIVE 04/20/2022 2202   Sepsis Labs: Invalid input(s): "PROCALCITONIN", "LACTICIDVEN"  Microbiology: No results found for this or any previous visit (from the past 240 hour(s)).  Radiology Studies: US RENAL  Result Date: 04/20/2022 CLINICAL DATA:  Renal failure EXAM: RENAL / URINARY TRACT ULTRASOUND COMPLETE COMPARISON:  None Available. FINDINGS: Right Kidney: Renal measurements: 11.4 x 4.3 x 5.3 cm = volume: 135 mL. Echogenicity within normal limits. No mass or hydronephrosis visualized. Left Kidney: Renal measurements: 11.1 x 6.1 x 5.4 cm = volume: 187.8 mL. Echogenicity within normal limits. No mass visualized. Mild pelviectasis. Bladder: Appears normal for degree of bladder distention. Other: None. IMPRESSION: Mild left renal pelviectasis. Electronically Signed   By: Ulyses Jarred M.D.   On: 04/20/2022 19:07   CT Head Wo Contrast  Result Date: 04/20/2022 CLINICAL DATA:  Dizziness EXAM: CT HEAD WITHOUT CONTRAST TECHNIQUE: Contiguous axial images were obtained from the base of the skull through the vertex without intravenous contrast. RADIATION DOSE REDUCTION: This exam was performed according to the departmental dose-optimization program which includes automated exposure control, adjustment of the mA and/or kV according to patient size and/or use of iterative reconstruction technique. COMPARISON:  CT brain 09/30/2021, 10/08/2019 FINDINGS: Brain: No acute territorial infarction, hemorrhage or intracranial mass. Mild white matter hypodensity consistent with chronic small vessel ischemic change. Small chronic white matter and anterior callosum infarcts. Stable ventricle size. Vascular: No hyperdense vessels.  Carotid vascular calcification  Skull: Normal. Negative for fracture or focal lesion. Sinuses/Orbits: No acute finding. Other: None IMPRESSION: 1. No CT evidence for acute intracranial abnormality. 2. Chronic small vessel ischemic changes of the white matter with multiple small chronic appearing infarcts. Electronically Signed   By: Donavan Foil M.D.   On: 04/20/2022 15:40      Canden Cieslinski T. [place]  If 7PM-7AM, please contact night-coverage [email or web address] 04/21/2022, 12:49 PM

## 2022-04-22 DIAGNOSIS — I639 Cerebral infarction, unspecified: Secondary | ICD-10-CM | POA: Diagnosis not present

## 2022-04-22 DIAGNOSIS — N184 Chronic kidney disease, stage 4 (severe): Secondary | ICD-10-CM

## 2022-04-22 DIAGNOSIS — F4322 Adjustment disorder with anxiety: Secondary | ICD-10-CM | POA: Diagnosis not present

## 2022-04-22 DIAGNOSIS — N179 Acute kidney failure, unspecified: Secondary | ICD-10-CM | POA: Diagnosis not present

## 2022-04-22 DIAGNOSIS — D638 Anemia in other chronic diseases classified elsewhere: Secondary | ICD-10-CM | POA: Diagnosis not present

## 2022-04-22 LAB — RENAL FUNCTION PANEL
Albumin: 3.6 g/dL (ref 3.5–5.0)
Anion gap: 12 (ref 5–15)
BUN: 42 mg/dL — ABNORMAL HIGH (ref 6–20)
CO2: 21 mmol/L — ABNORMAL LOW (ref 22–32)
Calcium: 8.9 mg/dL (ref 8.9–10.3)
Chloride: 107 mmol/L (ref 98–111)
Creatinine, Ser: 3.46 mg/dL — ABNORMAL HIGH (ref 0.44–1.00)
GFR, Estimated: 16 mL/min — ABNORMAL LOW (ref >60)
Glucose, Bld: 142 mg/dL — ABNORMAL HIGH (ref 70–99)
Phosphorus: 4.5 mg/dL (ref 2.5–4.6)
Potassium: 3.7 mmol/L (ref 3.5–5.1)
Sodium: 140 mmol/L (ref 135–145)

## 2022-04-22 LAB — CBC
HCT: 31.9 % — ABNORMAL LOW (ref 36.0–46.0)
Hemoglobin: 10.8 g/dL — ABNORMAL LOW (ref 12.0–15.0)
MCH: 29.9 pg (ref 26.0–34.0)
MCHC: 33.9 g/dL (ref 30.0–36.0)
MCV: 88.4 fL (ref 80.0–100.0)
Platelets: 230 10*3/uL (ref 150–400)
RBC: 3.61 MIL/uL — ABNORMAL LOW (ref 3.87–5.11)
RDW: 13.6 % (ref 11.5–15.5)
WBC: 5.7 10*3/uL (ref 4.0–10.5)
nRBC: 0 % (ref 0.0–0.2)

## 2022-04-22 LAB — GLUCOSE, CAPILLARY
Glucose-Capillary: 142 mg/dL — ABNORMAL HIGH (ref 70–99)
Glucose-Capillary: 124 mg/dL — ABNORMAL HIGH (ref 70–99)
Glucose-Capillary: 126 mg/dL — ABNORMAL HIGH (ref 70–99)
Glucose-Capillary: 169 mg/dL — ABNORMAL HIGH (ref 70–99)

## 2022-04-22 LAB — MAGNESIUM: Magnesium: 1.9 mg/dL (ref 1.7–2.4)

## 2022-04-22 LAB — HEMOGLOBIN A1C
Hgb A1c MFr Bld: 7.9 % — ABNORMAL HIGH (ref 4.8–5.6)
Mean Plasma Glucose: 180.03 mg/dL

## 2022-04-22 MED ORDER — CLONIDINE HCL 0.3 MG/24HR TD PTWK
0.3000 mg | MEDICATED_PATCH | TRANSDERMAL | Status: DC
  Administered 2022-04-22: 0.3 mg via TRANSDERMAL
  Filled 2022-04-22: qty 1

## 2022-04-22 MED ORDER — LABETALOL HCL 300 MG PO TABS
300.0000 mg | ORAL_TABLET | Freq: Two times a day (BID) | ORAL | Status: DC
  Administered 2022-04-22 – 2022-04-23 (×3): 300 mg via ORAL
  Filled 2022-04-22 (×4): qty 1

## 2022-04-22 MED ORDER — ISOSORB DINITRATE-HYDRALAZINE 20-37.5 MG PO TABS: 2.0000 | ORAL_TABLET | Freq: Three times a day (TID) | ORAL | Status: DC

## 2022-04-22 NOTE — Progress Notes (Signed)
Mobility Specialist Progress Note:   04/22/22 1200  Mobility  Activity Ambulated with assistance in hallway  Level of Assistance Standby assist, set-up cues, supervision of patient - no hands on  Assistive Device None;Other (Comment) (hallway rails)  Distance Ambulated (ft) 450 ft  Activity Response Tolerated well  $Mobility charge 1 Mobility   Pt agreeable to mobility session. Required no physical assist, however pt displayed minor unsteadiness throughout. States she has a cane and RW at brother-in-laws house however has not been living there recently. Pt back in bed with all needs met.   Nelta Numbers Acute Rehab Secure Chat or Office Phone: [phone]

## 2022-04-22 NOTE — TOC Progression Note (Signed)
Transition of Care [facility]) - Initial/Assessment Note    Patient Details  Name: [name] MRN: [id number] Date of Birth: 01/29/1971  Transition [facility] Care [facility]) CM/SW Contact:    Milinda Antis, Lena Phone Number: 04/22/2022, 2:00 PM  Clinical Narrative:                 CSW attempted to contact the family shelter at the [facility] once again to no avail.  CSW contacted [facility] leadership, Barbette Or, and requested that the patient be reviewed for homeless assistance.  TOC will continue to follow.    Expected Discharge Plan: Home/Self Care Barriers to Discharge: Continued Medical Work up   Patient Goals and CMS Choice        Expected Discharge Plan and Services Expected Discharge Plan: Home/Self Care In-house Referral: Clinical Social Work     Living arrangements for the past 2 months: [place], Homeless                                      Prior Living Arrangements/Services Living arrangements for the past 2 months: [place], Homeless Lives with:: Self, Minor Children Patient language and need for interpreter reviewed:: Yes Do you feel safe going back to the place where you live?: No   see note  Need for Family Participation in Patient Care: No (Comment) Care giver support system in place?: No (comment)   Criminal Activity/Legal Involvement Pertinent to Current Situation/Hospitalization: No - Comment as needed  Activities of Daily Living Home Assistive Devices/Equipment: None ADL Screening (condition at time of admission) [name] cognitive ability adequate to safely complete daily activities?: Yes Is the patient deaf or have difficulty hearing?: No Does the patient have difficulty seeing, even when wearing glasses/contacts?: No Does the patient have difficulty concentrating, remembering, or making decisions?: No Patient able to express need for assistance with ADLs?: Yes Does the patient have difficulty dressing or bathing?:  No Independently performs ADLs?: Yes (appropriate for developmental age) Does the patient have difficulty walking or climbing stairs?: No Weakness of Legs: Both Weakness of Arms/Hands: None  Permission Sought/Granted   Permission granted to share information with : Yes, Verbal Permission Granted     Permission granted to share info w AGENCY: homelessness assistance facilities        Emotional Assessment Appearance:: Appears older than stated age Attitude/Demeanor/Rapport: Engaged Affect (typically observed): Appropriate Orientation: : Oriented to Situation, Oriented to  Time, Oriented to Place, Oriented to Self Alcohol / Substance Use: Not Applicable Psych Involvement: No (comment)  Admission diagnosis:  AKI (acute kidney injury) ([place]) [N17.9] Acute renal failure superimposed on stage 3b chronic kidney disease ([place]) [N17.9, N18.32] Patient Active Problem List   Diagnosis Date Noted   Acute renal failure superimposed on stage 3b chronic kidney disease ([place]) 04/20/2022   Prolonged QT interval 04/20/2022   Anemia of chronic disease 04/20/2022   Homeless 04/20/2022   Preop cardiovascular exam 01/10/2022   Adjustment disorder with anxious mood 10/26/2021   Allergic rhinitis due to pollen 10/26/2021   Anxiety 10/26/2021   Dysmenorrhea 10/26/2021   Menorrhagia 10/26/2021   Mild intermittent asthma 10/26/2021   Stage 3 chronic kidney disease ([place]) 10/26/2021   Tobacco abuse 10/26/2021   Vitamin D deficiency 10/26/2021   Chronic osteomyelitis of right foot with draining sinus ([place])    Diabetic foot ulcer ([place]) 05/27/2021   PAC (premature atrial contraction) 02/15/2021  Symptomatic anemia 06/25/2020   Chronic heart failure with preserved ejection fraction ([place]) 06/25/2020   Morbid obesity ([place]) 03/30/2020   NSVT (nonsustained ventricular tachycardia) ([place]) 11/13/2019   Pericardial effusion 09/17/2019   H/O: stroke 04/09/2019   Left ventricular hypertrophy 04/09/2019   Mixed  hyperlipidemia 04/08/2019   Non compliance w medication regimen 02/20/2019   Resistant hypertension    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/13/2019   Cerebrovascular accident (CVA) ([place])    Acute CVA (cerebrovascular accident) ([place]) 02/11/2019   Hypertensive urgency 02/11/2019   ARF (acute renal failure) ([place]) 02/11/2019   Type 2 diabetes mellitus with stage 4 chronic kidney disease, with long-term current use of insulin ([place]) 02/11/2019   Enlargement of thyroid 07/13/2018   Uncontrolled type 2 diabetes mellitus with hyperglycemia, with long-term current use of insulin ([place]) 04/12/2018   Uncontrolled hypertension 01/08/2018   Obstructive sleep apnea 08/26/2017   PCP:  [name], MD Pharmacy:   [facility], [place], [address] [place] [address] Phone: [phone] Fax: [phone]  [id number] - [place], [address] [place] [address] Phone: [phone] Fax: [phone]  [place] [address] [place] [address] Phone: [phone] Fax: [phone]     Social Determinants of Health (SDOH) Interventions Housing Interventions: Inpatient TOC  Readmission Risk Interventions     No data to display

## 2022-04-22 NOTE — Progress Notes (Signed)
Nephrology Follow-Up Consult note   Assessment/Recommendations: [name] is a/an 51 y.o. female with a past medical history significant for DM2, CKD IV, HTN, HLD, admitted for AKI on CKD IV.      AKI on CKD IV: Baseline creatinine around 2.9.  Hemodynamic in nature with hypotensive insult.  She is making urine and there is no hydronephrosis- but mild L pelviaectasis but is likely not the primary driver of the AKI.  UA is bland             - Crt improving  - HTN meds as below             - no indication for dialysis at this point             - already has October followup with Dr Moshe Cipro per pt   2.   Hypotension/Hypertension  - BP fairly high today  - On norvasc 10mg  daily, clonidine 0.3mg  patch, and labetalol 300mg  BID             - would continue to hold BiDil, Lasix, and ACEi, will add back gradually as appropriate and as renal function continues to improve  - goal bp prior to DC likely around 703 systolic             - discussed importance of consistent medication consumption   3.  DM II             - per primary   4.  HLD             - on atorvastatin   5.  Dispo: admitted   Recommendations conveyed to primary service.    [facility] 04/22/2022 10:49 AM  ___________________________________________________________  CC: AKI  Interval History/Subjective: Blood pressure fairly high this morning with systolics around 500.  Patient states she feels well without dizziness, lightheadedness, shortness of breath, chest pain.  Creatinine improving down to 3.5.   Medications:  Current Facility-Administered Medications  Medication Dose Route Frequency Provider Last Rate Last Admin   acetaminophen (TYLENOL) tablet 650 mg  650 mg Oral Q6H PRN Orma Flaming, MD       Or   acetaminophen (TYLENOL) suppository 650 mg  650 mg Rectal Q6H PRN Orma Flaming, MD       amLODipine (NORVASC) tablet 10 mg  10 mg Oral Daily Wendee Beavers T, MD   10  mg at 04/22/22 0940   aspirin EC tablet 81 mg  81 mg Oral Daily Orma Flaming, MD   81 mg at 04/22/22 0940   busPIRone (BUSPAR) tablet 5 mg  5 mg Oral BID Orma Flaming, MD   5 mg at 04/22/22 0940   cloNIDine (CATAPRES - Dosed in mg/24 hr) patch 0.3 mg  0.3 mg Transdermal Weekly Wendee Beavers T, MD       fenofibrate tablet 54 mg  54 mg Oral Daily Orma Flaming, MD   54 mg at 04/22/22 0940   heparin injection 5,000 Units  5,000 Units Subcutaneous Q8H Orma Flaming, MD   5,000 Units at 04/22/22 0506   hydrALAZINE (APRESOLINE) tablet 25 mg  25 mg Oral Q6H PRN Wendee Beavers T, MD   25 mg at 04/21/22 2254   insulin aspart (novoLOG) injection 0-9 Units  0-9 Units Subcutaneous TID WC Orma Flaming, MD   1 Units at 04/22/22 0900   labetalol (NORMODYNE) tablet 300 mg  300 mg Oral BID Einar Grad, RPH   300 mg  at 04/22/22 0534   rosuvastatin (CRESTOR) tablet 40 mg  40 mg Oral Daily Orma Flaming, MD   40 mg at 04/22/22 0940      Review of Systems: 10 systems reviewed and negative except per interval history/subjective  Physical Exam: Vitals:   04/22/22 0828 04/22/22 0923  BP: (!) 156/82 (!) 173/89  Pulse: 65 67  Resp: 16 17  Temp: 98.4 F (36.9 C) 98.3 F (36.8 C)  SpO2: 100% 98%   No intake/output data recorded.  Intake/Output Summary (Last 24 hours) at 04/22/2022 1049 Last data filed at 04/22/2022 0600 Gross per 24 hour  Intake 1923.59 ml  Output 1250 ml  Net 673.59 ml   Constitutional: well-appearing, no acute distress ENMT: ears and nose without scars or lesions, MMM CV: normal rate, no edema Respiratory: Bilateral chest rise, normal work of breathing Gastrointestinal: soft, non-tender, no palpable masses or hernias Skin: no visible lesions or rashes Psych: alert, judgement/insight appropriate, appropriate mood and affect   Test Results I personally reviewed new and old clinical labs and radiology tests Lab Results  Component Value Date   NA 140 04/22/2022   K 3.7  04/22/2022   CL 107 04/22/2022   CO2 21 (L) 04/22/2022   BUN 42 (H) 04/22/2022   CREATININE 3.46 (H) 04/22/2022   CALCIUM 8.9 04/22/2022   ALBUMIN 3.6 04/22/2022   PHOS 4.5 04/22/2022    CBC Recent Labs  Lab 04/20/22 1407 04/21/22 0327 04/22/22 0515  WBC 9.7 7.9 5.7  NEUTROABS 8.0*  --   --   HGB 10.3* 9.6* 10.8*  HCT 30.8* 28.1* 31.9*  MCV 89.3 88.6 88.4  PLT 252 216 230

## 2022-04-22 NOTE — Progress Notes (Signed)
Pt places self on CPAP 

## 2022-04-22 NOTE — Progress Notes (Signed)
PROGRESS NOTE  [name] YTK:[id number] DOB: [birth year]   PCP: [name], MD  Patient is from: Homeless  DOA: 04/20/2022 LOS: 1  Chief complaints Chief Complaint  Patient presents with   Hypertension   Dizziness     Brief Narrative / Interim history: 51 year old F with PMH of XNT-7G, CAD, diastolic CHF, DM-2, HTN, CVA, asthma, pseudotumor cerebri, OSA on CPAP and noncompliance with medication directed to ED from PCP office with hypotension after she went to see PCP due to dizziness, blurry vision, headache and gait dysfunction.  Apparently, patient was not compliant with her medication and took all her pills that morning.  In ED, vitals stable. Cr 4.5 baseline about 2 but it was 2.9 on 6/8).  BUN 53.  Hgb 10.3 (baseline).  CT head without acute finding.  She was given IV fluid.  Hospitalist service consulted for admission for AKI.  Renal US with mild left renal pelviectasis.  UA and UDS without significant finding.  Nephrology consulted.  Resuming antihypertensive meds slowly.  Creatinine improving.  Subjective: Seen and examined earlier this morning.  No major events overnight of this morning.  No complaints. Blood pressure improved.  Objective: Vitals:   04/21/22 2133 04/22/22 0505 04/22/22 0828 04/22/22 0923  BP: (!) 178/90 (!) 194/99 (!) 156/82 (!) 173/89  Pulse: 74 70 65 67  Resp: 19 16 16 17   Temp: 98.3 F (36.8 C) 97.8 F (36.6 C) 98.4 F (36.9 C) 98.3 F (36.8 C)  TempSrc:  Oral Oral   SpO2: 100% 100% 100% 98%  Weight:      Height:        Examination:  GENERAL: No apparent distress.  Nontoxic. HEENT: MMM.  Vision and hearing grossly intact.  NECK: Supple.  No apparent JVD.  RESP:  No IWOB.  Fair aeration bilaterally. CVS:  RRR. Heart sounds normal.  ABD/GI/GU: BS+. Abd soft, NTND.  MSK/EXT:  Moves extremities. No apparent deformity. No edema.  SKIN: no apparent skin lesion or wound NEURO: Awake and alert. Oriented appropriately.  No  apparent focal neuro deficit. PSYCH: Calm. Normal affect.   Procedures:  None  Microbiology summarized: None  Assessment and plan: Principal Problem:   Acute renal failure superimposed on stage 4 chronic kidney disease (HCC) Active Problems:   Uncontrolled type 2 diabetes mellitus with hyperglycemia, with long-term current use of insulin (HCC)   Anemia of chronic disease   Prolonged QT interval   Chronic heart failure with preserved ejection fraction (HCC)   Uncontrolled hypertension   Cerebrovascular accident (CVA) ([place])   Adjustment disorder with anxious mood   Mixed hyperlipidemia   Obstructive sleep apnea   Homeless   Morbid obesity ([place])  AKI on CKD-4/azotemia: Could have progressive CKD.  Followed by [facility], Dr. Moshe Cipro.  Could be mediated by diabetes, hypotension or uncontrolled hypertension.  Takes Lotrel and Lasix but not compliant.  Denies NSAID use.  UA without significant finding.  UDS negative. Renal US with mild left renal pelviectasis.  Good urine output.  Improving. Recent Labs    05/28/21 0219 05/28/21 1431 05/29/21 0150 05/30/21 0139 07/21/21 1403 07/27/21 1242 01/13/22 0942 04/20/22 1407 04/21/22 0327 04/22/22 0515  BUN 23* 19 20 23* 19 21 21* 53* 52* 42*  CREATININE 1.96* 1.70* 2.01* 2.03* 1.94* 2.13* 2.92* 4.49* 4.19* 3.46*  -Resuming antihypertensive meds cautiously to avoid hypotension -Hold/avoid nephrotoxic meds -Strict intake and output -Nephrology following.  Uncontrolled IDDM-2 with hypoglycemia, hyperglycemia and CKD-3B: A1c 7.9%, 9.6% on 01/13/2022. Recent Labs  Lab 04/21/22 1138 04/21/22 1653 04/21/22 2032 04/22/22 0726 04/22/22 1138  GLUCAP 159* 184* 128* 142* 124*  -Continue SSI-sensitive -Continue statin.   Hypotension/near syncope/blurry vision/headache: Likely iatrogenic from antihypertensive meds.  CT head without acute finding.  Resolved. -Reinstating antihypertensive meds cautiously  Uncontrolled hypertension:  Probably due to noncompliance.  Has OSA but using CPAP. -Appreciate input by nephrology -Resuming Norvasc, labetalol and clonidine -Holding benazepril, BiDil and Lasix -P.o. hydralazine as needed   Anemia of renal disease: Anemia panel with some degree of iron deficiency.  Stable. Recent Labs    05/27/21 1458 05/28/21 0219 05/29/21 0150 05/30/21 0139 07/21/21 1403 01/13/22 0942 04/20/22 1407 04/21/22 0327 04/22/22 0515  HGB 12.4 10.5* 10.8* 10.3* 10.7* 10.4* 10.3* 9.6* 10.8*  -Monitor   Prolonged QT interval: QTc 519. -Continue telemetry monitoring -Minimize or avoid QT prolonging drugs -Optimize K and Mg  Chronic diastolic CHF: Appears euvolemic on exam.  No cardiopulmonary symptoms.  TTE in 12/22 with LVEF of 55%, indeterminate DD, moderate grade 2 AR.  On p.o. Lasix at home. -Monitor fluid and respiratory status -Continue holding Lasix in the setting of AKI   Cerebrovascular accident: Chronic and stable. -Continue ASA and high dose statin    Adjustment disorder with anxious mood -Continue buspar BID but not great choice with renal failure   Mixed hyperlipidemia Continue crestor    Obstructive sleep apnea cpap nightly    Homeless Social work consult   Hypokalemia: Resolved.  Morbid obesity Body mass index is 35.32 kg/m. -Encourage lifestyle change to lose weight          DVT prophylaxis:  heparin injection 5,000 Units Start: 04/20/22 1700  Code Status: Full code Family Communication: None at bedside Level of care: Telemetry Medical Status is: Inpatient Remains inpatient appropriate because: AKI and uncontrolled hypertension   Final disposition: Likely home in the next 24 to 48 hours Consultants:  Nephrology  Sch Meds:  Scheduled Meds:  amLODipine  10 mg Oral Daily   aspirin EC  81 mg Oral Daily   busPIRone  5 mg Oral BID   cloNIDine  0.3 mg Transdermal Weekly   fenofibrate  54 mg Oral Daily   heparin  5,000 Units Subcutaneous Q8H    insulin aspart  0-9 Units Subcutaneous TID WC   labetalol  300 mg Oral BID   rosuvastatin  40 mg Oral Daily   Continuous Infusions:   PRN Meds:.acetaminophen **OR** acetaminophen, hydrALAZINE  Antimicrobials: Anti-infectives (From admission, onward)    None        I have personally reviewed the following labs and images: CBC: Recent Labs  Lab 04/20/22 1407 04/21/22 0327 04/22/22 0515  WBC 9.7 7.9 5.7  NEUTROABS 8.0*  --   --   HGB 10.3* 9.6* 10.8*  HCT 30.8* 28.1* 31.9*  MCV 89.3 88.6 88.4  PLT 252 216 230   BMP &GFR Recent Labs  Lab 04/20/22 1407 04/20/22 1754 04/21/22 0327 04/22/22 0515  NA 139  --  139 140  K 4.0  --  3.3* 3.7  CL 108  --  109 107  CO2 20*  --  22 21*  GLUCOSE 89  --  189* 142*  BUN 53*  --  52* 42*  CREATININE 4.49*  --  4.19* 3.46*  CALCIUM 8.6*  --  8.3* 8.9  MG  --  1.9  --  1.9  PHOS  --   --   --  4.5   Estimated Creatinine Clearance: 25.8 mL/min (A) (by  C-G formula based on SCr of 3.46 mg/dL (H)). Liver & Pancreas: Recent Labs  Lab 04/20/22 1407 04/22/22 0515  AST 14*  --   ALT 11  --   ALKPHOS 50  --   BILITOT 0.5  --   PROT 7.1  --   ALBUMIN 3.4* 3.6   No results for input(s): "LIPASE", "AMYLASE" in the last 168 hours. No results for input(s): "AMMONIA" in the last 168 hours. Diabetic: Recent Labs    04/22/22 0515  HGBA1C 7.9*   Recent Labs  Lab 04/21/22 1138 04/21/22 1653 04/21/22 2032 04/22/22 0726 04/22/22 1138  GLUCAP 159* 184* 128* 142* 124*   Cardiac Enzymes: No results for input(s): "CKTOTAL", "CKMB", "CKMBINDEX", "TROPONINI" in the last 168 hours. No results for input(s): "PROBNP" in the last 8760 hours. Coagulation Profile: No results for input(s): "INR", "PROTIME" in the last 168 hours. Thyroid Function Tests: Recent Labs    04/20/22 1407  TSH 1.115   Lipid Profile: No results for input(s): "CHOL", "HDL", "LDLCALC", "TRIG", "CHOLHDL", "LDLDIRECT" in the last 72 hours. Anemia  Panel: Recent Labs    04/20/22 1754  FERRITIN 83  TIBC 365  IRON 39   Urine analysis:    Component Value Date/Time   COLORURINE YELLOW 04/20/2022 2202   APPEARANCEUR CLEAR 04/20/2022 2202   LABSPEC 1.012 04/20/2022 2202   PHURINE 5.0 04/20/2022 2202   GLUCOSEU NEGATIVE 04/20/2022 2202   HGBUR NEGATIVE 04/20/2022 2202   BILIRUBINUR NEGATIVE 04/20/2022 2202   KETONESUR NEGATIVE 04/20/2022 2202   PROTEINUR 100 (A) 04/20/2022 2202   UROBILINOGEN 0.2 08/25/2011 2043   NITRITE NEGATIVE 04/20/2022 2202   LEUKOCYTESUR NEGATIVE 04/20/2022 2202   Sepsis Labs: Invalid input(s): "PROCALCITONIN", "LACTICIDVEN"  Microbiology: No results found for this or any previous visit (from the past 240 hour(s)).  Radiology Studies: No results found.    Umi Mainor T. Osterdock  If 7PM-7AM, please contact night-coverage [email or web address] 04/22/2022, 2:19 PM

## 2022-04-23 DIAGNOSIS — N179 Acute kidney failure, unspecified: Secondary | ICD-10-CM | POA: Diagnosis not present

## 2022-04-23 DIAGNOSIS — I639 Cerebral infarction, unspecified: Secondary | ICD-10-CM | POA: Diagnosis not present

## 2022-04-23 DIAGNOSIS — D638 Anemia in other chronic diseases classified elsewhere: Secondary | ICD-10-CM | POA: Diagnosis not present

## 2022-04-23 DIAGNOSIS — F4322 Adjustment disorder with anxiety: Secondary | ICD-10-CM | POA: Diagnosis not present

## 2022-04-23 LAB — RENAL FUNCTION PANEL
Albumin: 3.3 g/dL — ABNORMAL LOW (ref 3.5–5.0)
Anion gap: 12 (ref 5–15)
BUN: 37 mg/dL — ABNORMAL HIGH (ref 6–20)
CO2: 22 mmol/L (ref 22–32)
Calcium: 8.8 mg/dL — ABNORMAL LOW (ref 8.9–10.3)
Chloride: 105 mmol/L (ref 98–111)
Creatinine, Ser: 3.5 mg/dL — ABNORMAL HIGH (ref 0.44–1.00)
GFR, Estimated: 15 mL/min — ABNORMAL LOW (ref >60)
Glucose, Bld: 195 mg/dL — ABNORMAL HIGH (ref 70–99)
Phosphorus: 4.8 mg/dL — ABNORMAL HIGH (ref 2.5–4.6)
Potassium: 4.3 mmol/L (ref 3.5–5.1)
Sodium: 139 mmol/L (ref 135–145)

## 2022-04-23 LAB — HEMOGLOBIN AND HEMATOCRIT, BLOOD
HCT: 30.8 % — ABNORMAL LOW (ref 36.0–46.0)
Hemoglobin: 10.2 g/dL — ABNORMAL LOW (ref 12.0–15.0)

## 2022-04-23 LAB — GLUCOSE, CAPILLARY
Glucose-Capillary: 151 mg/dL — ABNORMAL HIGH (ref 70–99)
Glucose-Capillary: 104 mg/dL — ABNORMAL HIGH (ref 70–99)

## 2022-04-23 LAB — MAGNESIUM: Magnesium: 1.7 mg/dL (ref 1.7–2.4)

## 2022-04-23 MED ORDER — AMLODIPINE BESYLATE 10 MG PO TABS: 10.0000 mg | ORAL_TABLET | Freq: Every day | ORAL | 1 refills | Status: DC

## 2022-04-23 MED ORDER — ISOSORB DINITRATE-HYDRALAZINE 20-37.5 MG PO TABS
1.0000 | ORAL_TABLET | Freq: Three times a day (TID) | ORAL | Status: DC
  Administered 2022-04-23: 1 via ORAL
  Filled 2022-04-23 (×3): qty 1

## 2022-04-23 MED ORDER — ISOSORB DINITRATE-HYDRALAZINE 20-37.5 MG PO TABS: 1.0000 | ORAL_TABLET | Freq: Three times a day (TID) | ORAL | 1 refills | Status: AC

## 2022-04-23 NOTE — Discharge Summary (Signed)
Physician Discharge Summary  [name] VQM:[id number] DOB: [birth year] DOA: 04/20/2022  PCP: [name], MD  Admit date: 04/20/2022 Discharge date: 04/23/2022 Admitted From: Home/car Disposition: Home/car Recommendations for Outpatient Follow-up:  Follow up with PCP in 1 to 2 weeks Nephrology to arrange outpatient follow-up Check BP, BMP and CBC at follow-up Adjust antihypertensive meds as appropriate Please follow up on the following pending results: None  Home Health: Not indicated Equipment/Devices: Not indicated  Discharge Condition: Stable CODE STATUS: Full code  Follow-up Information     Osei-Bonsu, [name], MD. Schedule an appointment as soon as possible for a visit in 1 week(s).   Specialty: Internal Medicine Contact information: [address] [place] [address] [phone]                 Hospital course 51 year old F with PMH of PYK-9X, CAD, diastolic CHF, DM-2, HTN, CVA, asthma, pseudotumor cerebri, OSA on CPAP and noncompliance with medication directed to ED from PCP office with hypotension after she went to see PCP due to dizziness, blurry vision, headache and gait dysfunction.  Apparently, patient was not compliant with her medications, and took all her antihypertensive meds that morning.   In ED, vitals stable. Cr 4.5 baseline about 2 but it was 2.9 on 6/8).  BUN 53.  Hgb 10.3 (baseline).  CT head without acute finding.  She was given IV fluid.  Hospitalist service consulted for admission for AKI.  She was started on IV fluid.   Renal US with mild left renal pelviectasis.  UA and UDS without significant finding.  Nephrology consulted.  IV fluid discontinued.  Antihypertensive meds resumed gradually.  Blood pressure and creatinine improved.  She was cleared for discharge by nephrology for outpatient follow-up.  She is discharged on amlodipine, labetalol and clonidine.  BiDil decreased.  Benazepril and Lasix discontinued on  discharge.  See individual problem list below for more.   Problems addressed during this hospitalization Principal Problem:   Acute renal failure superimposed on stage 4 chronic kidney disease (HCC) Active Problems:   Uncontrolled type 2 diabetes mellitus with hyperglycemia, with long-term current use of insulin (HCC)   Anemia of chronic disease   Prolonged QT interval   Chronic heart failure with preserved ejection fraction (HCC)   Uncontrolled hypertension   Cerebrovascular accident (CVA) ([place])   Adjustment disorder with anxious mood   Mixed hyperlipidemia   Obstructive sleep apnea   Homeless   Morbid obesity ([place])   AKI on CKD-4/azotemia: Could have progressive CKD.  Followed by [facility], Dr. Moshe Cipro.  Could be mediated by diabetes, hypotension or uncontrolled hypertension.  Takes Lotrel and Lasix but not compliant.  Denies NSAID use.  UA without significant finding.  UDS negative. Renal US with mild left renal pelviectasis.  Good urine output.  Improved. Recent Labs    05/28/21 1431 05/29/21 0150 05/30/21 0139 07/21/21 1403 07/27/21 1242 01/13/22 0942 04/20/22 1407 04/21/22 0327 04/22/22 0515 04/23/22 0836  BUN 19 20 23* 19 21 21* 53* 52* 42* 37*  CREATININE 1.70* 2.01* 2.03* 1.94* 2.13* 2.92* 4.49* 4.19* 3.46* 3.50*  -Antihypertensive meds as above. -Check BMP at follow-up -Benazepril and Lasix discontinued   Uncontrolled IDDM-2 with hypoglycemia, hyperglycemia and CKD-3B: A1c 7.9%, 9.6% on 01/13/2022. -Continue home meds.   Hypotension/near syncope/blurry vision/headache: Likely iatrogenic from antihypertensive meds.  CT head without acute finding.  Resolved. -Hypertensive meds adjusted as above.   Uncontrolled hypertension: Probably due to noncompliance.  Has OSA but using CPAP.  Improved. -Discharged on amlodipine, labetalol and clonidine.   -BiDil decreased.  Benazepril and Lasix discontinued on discharge. -Reassess blood pressure and adjust antihypertensive  meds as appropriate  Anemia of renal disease: Anemia panel with some degree of iron deficiency.  Stable. -Recheck CBC at follow-up   Prolonged QT interval: QTc 519. -Minimize or avoid QT prolonging drugs   Chronic diastolic CHF: Appears euvolemic on exam.  No cardiopulmonary symptoms.  TTE in 12/22 with LVEF of 55%, indeterminate DD, moderate grade 2 AR.  On p.o. Lasix at home. -Lasix discontinued on discharge due to AKI.   Cerebrovascular accident: Chronic and stable. -Continue ASA and high dose statin    Adjustment disorder with anxious mood -Continue buspar BID but not great choice with renal failure   Mixed hyperlipidemia Continue crestor    Obstructive sleep apnea cpap nightly    Homeless Social work consult    Hypokalemia: Resolved.   Morbid obesity           Vital signs Vitals:   04/22/22 2130 04/23/22 0514 04/23/22 0919 04/23/22 1144  BP: (!) 174/96 (!) 178/87 (!) 174/83 138/75  Pulse: 73 65 70 70  Temp: 98.3 F (36.8 C) 97.8 F (36.6 C) 98.3 F (36.8 C)   Resp: _0 Height:      Weight:      SpO2: 99% 96% 98% 100%  TempSrc:  Oral Oral   BMI (Calculated):         Discharge exam  GENERAL: No apparent distress.  Nontoxic. HEENT: MMM.  Vision and hearing grossly intact.  NECK: Supple.  No apparent JVD.  RESP:  No IWOB.  Fair aeration bilaterally. CVS:  RRR. Heart sounds normal.  ABD/GI/GU: BS+. Abd soft, NTND.  MSK/EXT:  Moves extremities. No apparent deformity. No edema.  SKIN: no apparent skin lesion or wound NEURO: Awake and alert. Oriented appropriately.  No apparent focal neuro deficit. PSYCH: Calm. Normal affect.   Discharge Instructions Discharge Instructions     Call MD for:  difficulty breathing, headache or visual disturbances   Complete by: As directed    Call MD for:  extreme fatigue   Complete by: As directed    Call MD for:  persistant dizziness or light-headedness   Complete by: As directed    Diet - low sodium heart  healthy   Complete by: As directed    Diet Carb Modified   Complete by: As directed    Discharge instructions   Complete by: As directed    It has been a pleasure taking care of you!  You were hospitalized due to low blood pressure and kidney injury likely from low blood pressure and some of your blood pressure medications.  Your blood pressure and kidney function improved with adjustment of your blood pressure medications.  It is very important that you take your medications as prescribed.  Please review your new medication list and the directions on your medications before you take them.  Continue using CPAP at night and when you sleep.  Avoid over-the-counter pain medication other than plain Tylenol.  Follow-up with your primary care doctor in 1 to 2 weeks or sooner if needed.  Follow-up with your nephrologist per their recommendation.   Take care,   Increase activity slowly   Complete by: As directed       Allergies as of 04/23/2022       Reactions   Bee Pollen Other (See Comments)   Seasonal Allergies   Dust  Mite Extract Cough   Sneezing & Cough   Mixed Ragweed Itching        Medication List     STOP taking these medications    amLODipine-benazepril 10-40 MG capsule Commonly known as: LOTREL   furosemide 40 MG tablet Commonly known as: LASIX   methocarbamol 750 MG tablet Commonly known as: ROBAXIN       TAKE these medications    Accu-Chek FastClix Lancets Misc 4 (four) times daily.   Accu-Chek Guide test strip Generic drug: glucose blood 1 each by Other route See admin instructions. for testing   albuterol 108 (90 Base) MCG/ACT inhaler Commonly known as: VENTOLIN HFA Inhale 1-2 puffs into the lungs every 6 (six) hours as needed for wheezing or shortness of breath.   amLODipine 10 MG tablet Commonly known as: NORVASC Take 1 tablet (10 mg total) by mouth daily.   aspirin EC 81 MG tablet Take 1 tablet (81 mg total) by mouth daily.   blood glucose  meter kit and supplies Kit Dispense based on patient and insurance preference. Use up to four times daily as directed. (FOR ICD-9 250.00, 250.01).   busPIRone 5 MG tablet Commonly known as: BUSPAR Take 5 mg by mouth 2 (two) times daily.   cloNIDine 0.3 mg/24hr patch Commonly known as: CATAPRES - Dosed in mg/24 hr Place 1 patch (0.3 mg total) onto the skin once a week. What changed: when to take this   Dexcom G7 Receiver Devi Use to monitor BG continuously   Dexcom G7 Sensor Misc Change sensor every 10 days   EpiPen 2-Pak 0.3 mg/0.3 mL Soaj injection Generic drug: EPINEPHrine Inject 0.3 mg into the muscle as needed for anaphylaxis.   fenofibrate 48 MG tablet Commonly known as: TRICOR Take 48 mg by mouth daily.   fluticasone 50 MCG/ACT nasal spray Commonly known as: FLONASE Place 2 sprays into both nostrils daily as needed for allergies or rhinitis.   glipiZIDE 5 MG 24 hr tablet Commonly known as: GLUCOTROL XL Take 5 mg by mouth daily with breakfast.   hydrOXYzine 25 MG capsule Commonly known as: VISTARIL Take 25 mg by mouth at bedtime.   insulin aspart protamine - aspart (70-30) 100 UNIT/ML FlexPen Commonly known as: NOVOLOG 70/30 MIX Inject 20 Units into the skin 2 (two) times daily with a meal.   Insulin Syringes (Disposable) O-277 1 ML Misc 1 application by Does not apply route 2 (two) times a day.   ipratropium-albuterol 0.5-2.5 (3) MG/3ML Soln Commonly known as: DUONEB Take 3 mLs by nebulization every 4 (four) hours as needed.   isosorbide-hydrALAZINE 20-37.5 MG tablet Commonly known as: BiDil Take 1 tablet by mouth 3 (three) times daily. What changed: how much to take   labetalol 300 MG tablet Commonly known as: NORMODYNE Take 300 mg by mouth 2 (two) times daily.   rosuvastatin 40 MG tablet Commonly known as: CRESTOR Take 40 mg by mouth daily.        Consultations: Nephrology  Procedures/Studies: None   US RENAL  Result Date:  04/20/2022 CLINICAL DATA:  Renal failure EXAM: RENAL / URINARY TRACT ULTRASOUND COMPLETE COMPARISON:  None Available. FINDINGS: Right Kidney: Renal measurements: 11.4 x 4.3 x 5.3 cm = volume: 135 mL. Echogenicity within normal limits. No mass or hydronephrosis visualized. Left Kidney: Renal measurements: 11.1 x 6.1 x 5.4 cm = volume: 187.8 mL. Echogenicity within normal limits. No mass visualized. Mild pelviectasis. Bladder: Appears normal for degree of bladder distention. Other: None. IMPRESSION: Mild left  renal pelviectasis. Electronically Signed   By: Ulyses Jarred M.D.   On: 04/20/2022 19:07   CT Head Wo Contrast  Result Date: 04/20/2022 CLINICAL DATA:  Dizziness EXAM: CT HEAD WITHOUT CONTRAST TECHNIQUE: Contiguous axial images were obtained from the base of the skull through the vertex without intravenous contrast. RADIATION DOSE REDUCTION: This exam was performed according to the departmental dose-optimization program which includes automated exposure control, adjustment of the mA and/or kV according to patient size and/or use of iterative reconstruction technique. COMPARISON:  CT brain 09/30/2021, 10/08/2019 FINDINGS: Brain: No acute territorial infarction, hemorrhage or intracranial mass. Mild white matter hypodensity consistent with chronic small vessel ischemic change. Small chronic white matter and anterior callosum infarcts. Stable ventricle size. Vascular: No hyperdense vessels.  Carotid vascular calcification Skull: Normal. Negative for fracture or focal lesion. Sinuses/Orbits: No acute finding. Other: None IMPRESSION: 1. No CT evidence for acute intracranial abnormality. 2. Chronic small vessel ischemic changes of the white matter with multiple small chronic appearing infarcts. Electronically Signed   By: Donavan Foil M.D.   On: 04/20/2022 15:40       The results of significant diagnostics from this hospitalization (including imaging, microbiology, ancillary and laboratory) are listed below  for reference.     Microbiology: No results found for this or any previous visit (from the past 240 hour(s)).   Labs:  CBC: Recent Labs  Lab 04/20/22 1407 04/21/22 0327 04/22/22 0515 04/23/22 0836  WBC 9.7 7.9 5.7  --   NEUTROABS 8.0*  --   --   --   HGB 10.3* 9.6* 10.8* 10.2*  HCT 30.8* 28.1* 31.9* 30.8*  MCV 89.3 88.6 88.4  --   PLT 252 216 230  --    BMP &GFR Recent Labs  Lab 04/20/22 1407 04/20/22 1754 04/21/22 0327 04/22/22 0515 04/23/22 0836  NA 139  --  139 140 139  K 4.0  --  3.3* 3.7 4.3  CL 108  --  109 107 105  CO2 20*  --  22 21* 22  GLUCOSE 89  --  189* 142* 195*  BUN 53*  --  52* 42* 37*  CREATININE 4.49*  --  4.19* 3.46* 3.50*  CALCIUM 8.6*  --  8.3* 8.9 8.8*  MG  --  1.9  --  1.9 1.7  PHOS  --   --   --  4.5 4.8*   Estimated Creatinine Clearance: 25.5 mL/min (A) (by C-G formula based on SCr of 3.5 mg/dL (H)). Liver & Pancreas: Recent Labs  Lab 04/20/22 1407 04/22/22 0515 04/23/22 0836  AST 14*  --   --   ALT 11  --   --   ALKPHOS 50  --   --   BILITOT 0.5  --   --   PROT 7.1  --   --   ALBUMIN 3.4* 3.6 3.3*   No results for input(s): "LIPASE", "AMYLASE" in the last 168 hours. No results for input(s): "AMMONIA" in the last 168 hours. Diabetic: Recent Labs    04/22/22 0515  HGBA1C 7.9*   Recent Labs  Lab 04/22/22 1138 04/22/22 1639 04/22/22 2045 04/23/22 0733 04/23/22 1148  GLUCAP 124* 126* 169* 151* 104*   Cardiac Enzymes: No results for input(s): "CKTOTAL", "CKMB", "CKMBINDEX", "TROPONINI" in the last 168 hours. No results for input(s): "PROBNP" in the last 8760 hours. Coagulation Profile: No results for input(s): "INR", "PROTIME" in the last 168 hours. Thyroid Function Tests: No results for input(s): "TSH", "T4TOTAL", "FREET4", "T3FREE", "THYROIDAB"  in the last 72 hours. Lipid Profile: No results for input(s): "CHOL", "HDL", "LDLCALC", "TRIG", "CHOLHDL", "LDLDIRECT" in the last 72 hours. Anemia Panel: Recent Labs     04/20/22 1754  FERRITIN 83  TIBC 365  IRON 39   Urine analysis:    Component Value Date/Time   COLORURINE YELLOW 04/20/2022 2202   APPEARANCEUR CLEAR 04/20/2022 2202   LABSPEC 1.012 04/20/2022 2202   PHURINE 5.0 04/20/2022 2202   GLUCOSEU NEGATIVE 04/20/2022 2202   HGBUR NEGATIVE 04/20/2022 2202   BILIRUBINUR NEGATIVE 04/20/2022 2202   KETONESUR NEGATIVE 04/20/2022 2202   PROTEINUR 100 (A) 04/20/2022 2202   UROBILINOGEN 0.2 08/25/2011 2043   NITRITE NEGATIVE 04/20/2022 2202   LEUKOCYTESUR NEGATIVE 04/20/2022 2202   Sepsis Labs: Invalid input(s): "PROCALCITONIN", "LACTICIDVEN"   SIGNED:  Mercy Riding, MD  [facility] 04/23/2022, 4:48 PM

## 2022-04-23 NOTE — Progress Notes (Signed)
Nephrology Follow-Up Consult note   Assessment/Recommendations: [name] is a/an 51 y.o. female with a past medical history significant for DM2, CKD IV, HTN, HLD, admitted for AKI on CKD IV.      AKI on CKD IV: Baseline creatinine around 2.9.  Hemodynamic in nature with hypotensive insult.  She is making urine and there is no hydronephrosis- but mild L pelviaectasis but is likely not the primary driver of the AKI.  UA is bland             - Crt stable today at 3.5  - HTN meds as below             - no indication for dialysis at this point             - already has October followup in our office  -I will arrange a BMP next Thursday to follow-up her progress   2.   Hypotension/Hypertension  - BP high but overall improved  - On norvasc 10mg  daily, clonidine 0.3mg  patch, and labetalol 300mg  BID  -Start back BiDil today             - would continue to hold Lasix and ACEi, will add back gradually as appropriate and as renal function continues to improve  - goal bp prior to DC likely around 372 systolic             - discussed importance of consistent medication consumption  -If her blood pressure improves to around 160s by this afternoon I think she is okay to discharge and will follow-up in our clinic   3.  DM II             - per primary   4.  HLD             - on atorvastatin   5.  Dispo: admitted   Recommendations conveyed to primary service.    [facility] 04/23/2022 9:52 AM  ___________________________________________________________  CC: AKI  Interval History/Subjective: Patient states she overall feels fairly good.  Blood pressure fairly steady around 170.  Denies dizziness or lightheadedness.  Creatinine about the same at 3.5   Medications:  Current Facility-Administered Medications  Medication Dose Route Frequency Provider Last Rate Last Admin   acetaminophen (TYLENOL) tablet 650 mg  650 mg Oral Q6H PRN Orma Flaming, MD        Or   acetaminophen (TYLENOL) suppository 650 mg  650 mg Rectal Q6H PRN Orma Flaming, MD       amLODipine (NORVASC) tablet 10 mg  10 mg Oral Daily Wendee Beavers T, MD   10 mg at 04/23/22 0930   aspirin EC tablet 81 mg  81 mg Oral Daily Orma Flaming, MD   81 mg at 04/23/22 0930   busPIRone (BUSPAR) tablet 5 mg  5 mg Oral BID Orma Flaming, MD   5 mg at 04/23/22 0930   cloNIDine (CATAPRES - Dosed in mg/24 hr) patch 0.3 mg  0.3 mg Transdermal Weekly Wendee Beavers T, MD   0.3 mg at 04/22/22 1114   fenofibrate tablet 54 mg  54 mg Oral Daily Orma Flaming, MD   54 mg at 04/23/22 0930   heparin injection 5,000 Units  5,000 Units Subcutaneous Q8H Orma Flaming, MD   5,000 Units at 04/23/22 0708   hydrALAZINE (APRESOLINE) tablet 25 mg  25 mg Oral Q6H PRN Wendee Beavers T, MD   25 mg at 04/21/22 2254  insulin aspart (novoLOG) injection 0-9 Units  0-9 Units Subcutaneous TID WC Orma Flaming, MD   2 Units at 04/23/22 0835   isosorbide-hydrALAZINE (BIDIL) 20-37.5 MG per tablet 1 tablet  1 tablet Oral TID Reesa Chew, MD   1 tablet at 04/23/22 0930   labetalol (NORMODYNE) tablet 300 mg  300 mg Oral BID Einar Grad, RPH   300 mg at 04/23/22 0930   rosuvastatin (CRESTOR) tablet 40 mg  40 mg Oral Daily Orma Flaming, MD   40 mg at 04/23/22 0930      Review of Systems: 10 systems reviewed and negative except per interval history/subjective  Physical Exam: Vitals:   04/23/22 0514 04/23/22 0919  BP: (!) 178/87 (!) 174/83  Pulse: 65 70  Resp: 18 17  Temp: 97.8 F (36.6 C) 98.3 F (36.8 C)  SpO2: 96% 98%   No intake/output data recorded.  Intake/Output Summary (Last 24 hours) at 04/23/2022 4287 Last data filed at 04/23/2022 0200 Gross per 24 hour  Intake 600 ml  Output --  Net 600 ml   Constitutional: well-appearing, no acute distress ENMT: ears and nose without scars or lesions, MMM CV: normal rate, no edema Respiratory: Bilateral chest rise, normal work of  breathing Gastrointestinal: soft, non-tender, no palpable masses or hernias Skin: no visible lesions or rashes Psych: alert, judgement/insight appropriate, appropriate mood and affect   Test Results I personally reviewed new and old clinical labs and radiology tests Lab Results  Component Value Date   NA 139 04/23/2022   K 4.3 04/23/2022   CL 105 04/23/2022   CO2 22 04/23/2022   BUN 37 (H) 04/23/2022   CREATININE 3.50 (H) 04/23/2022   CALCIUM 8.8 (L) 04/23/2022   ALBUMIN 3.3 (L) 04/23/2022   PHOS 4.8 (H) 04/23/2022    CBC Recent Labs  Lab 04/20/22 1407 04/21/22 0327 04/22/22 0515 04/23/22 0836  WBC 9.7 7.9 5.7  --   NEUTROABS 8.0*  --   --   --   HGB 10.3* 9.6* 10.8* 10.2*  HCT 30.8* 28.1* 31.9* 30.8*  MCV 89.3 88.6 88.4  --   PLT 252 216 230  --

## 2022-04-23 NOTE — Progress Notes (Signed)
DISCHARGE NOTE HOME CADYN RODGER to be discharged Home per MD order. Discussed prescriptions and follow up appointments with the patient. Prescriptions given to patient; medication list explained in detail. Patient verbalized understanding.  Skin clean, dry and intact without evidence of skin break down, no evidence of skin tears noted. IV catheter discontinued intact. Site without signs and symptoms of complications. Dressing and pressure applied. Pt denies pain at the site currently. No complaints noted.  Patient free of lines, drains, and wounds.   An After Visit Summary (AVS) was printed and given to the patient. Patient escorted via wheelchair, and discharged home via private auto.  Berneta Levins, RN

## 2022-04-23 NOTE — TOC Transition Note (Signed)
Transition of Care [facility]) - CM/SW Discharge Note   Patient Details  Name: [name] MRN: [id number] Date of Birth: [birth year]  Transition of Care [facility]) CM/SW Contact:  Bartholomew Crews, RN Phone Number: [phone] 04/23/2022, 2:25 PM   Clinical Narrative:     Patient to transition home today. Advised by nursing of PT recommendations for cane - patient to get a cane at a thrift store. No further TOC needs identified at this time.   Final next level of care: Home/Self Care Barriers to Discharge: Continued Medical Work up   Patient Goals and CMS Choice        Discharge Placement                       Discharge Plan and Services In-house Referral: Clinical Social Work                                   Social Determinants of Health (SDOH) Interventions Housing Interventions: Inpatient TOC   Readmission Risk Interventions     No data to display

## 2022-04-26 ENCOUNTER — Ambulatory Visit: Payer: Medicaid Other

## 2022-04-26 NOTE — Progress Notes (Signed)
Pt brought her Dexcom G7 sensor with her but it is not compatible with her phone nor did she have the [place] receiver. Adivsed pt she would need to either update to a phone that is compatible or purchase the receiver. Pt voiced understanding. Blood glucose readings from current monitor evaluated, pt is taking glipizide 10mg  daily and Novolog 70/30 20 units bid. Discussed readings and medications with Dr.Nida. Advised pt to decrease Novolog 70/30 to 15 units bid and continue glipizide 10mg  daily per Dr.Nida's orders. Understanding voiced per pt.

## 2022-05-13 NOTE — Telephone Encounter (Signed)
error 

## 2022-05-27 ENCOUNTER — Other Ambulatory Visit (HOSPITAL_COMMUNITY): Payer: Self-pay | Admitting: *Deleted

## 2022-05-27 DIAGNOSIS — N185 Chronic kidney disease, stage 5: Secondary | ICD-10-CM

## 2022-05-27 MED ORDER — RETACRIT 10000 UNIT/ML IJ SOLN: 10000.0000 [IU] | Freq: Once | INTRAMUSCULAR | 0 refills | Status: AC

## 2022-05-31 ENCOUNTER — Ambulatory Visit (HOSPITAL_COMMUNITY)
Admission: RE | Admit: 2022-05-31 | Discharge: 2022-05-31 | Disposition: A | Discharge status: Home / Self Care | Payer: Medicaid Other | Source: Ambulatory Visit | Attending: Nephrology | Admitting: Nephrology

## 2022-05-31 VITALS — BP 135/64 | HR 77 | Temp 97.1°F | Resp 19

## 2022-05-31 DIAGNOSIS — N184 Chronic kidney disease, stage 4 (severe): Secondary | ICD-10-CM | POA: Diagnosis present

## 2022-05-31 DIAGNOSIS — N179 Acute kidney failure, unspecified: Secondary | ICD-10-CM | POA: Insufficient documentation

## 2022-05-31 LAB — POCT HEMOGLOBIN-HEMACUE: Hemoglobin: 9.7 g/dL — ABNORMAL LOW (ref 12.0–15.0)

## 2022-05-31 MED ORDER — EPOETIN ALFA-EPBX 10000 UNIT/ML IJ SOLN
10000.0000 [IU] | INTRAMUSCULAR | Status: DC
  Administered 2022-05-31: 10000 [IU] via SUBCUTANEOUS

## 2022-05-31 MED ORDER — SODIUM CHLORIDE 0.9 % IV SOLN
510.0000 mg | INTRAVENOUS | Status: DC
  Administered 2022-05-31: 510 mg via INTRAVENOUS
  Filled 2022-05-31: qty 17

## 2022-05-31 MED ORDER — EPOETIN ALFA-EPBX 10000 UNIT/ML IJ SOLN
INTRAMUSCULAR | Status: AC
  Filled 2022-05-31: qty 1

## 2022-06-14 ENCOUNTER — Encounter (HOSPITAL_COMMUNITY)
Admission: RE | Admit: 2022-06-14 | Discharge: 2022-06-14 | Disposition: A | Discharge status: Home / Self Care | Payer: Medicaid Other | Source: Ambulatory Visit | Attending: Nephrology | Admitting: Nephrology

## 2022-06-14 ENCOUNTER — Ambulatory Visit: Payer: Medicaid Other | Admitting: Podiatry

## 2022-06-14 DIAGNOSIS — Z89411 Acquired absence of right great toe: Secondary | ICD-10-CM

## 2022-06-14 DIAGNOSIS — E1142 Type 2 diabetes mellitus with diabetic polyneuropathy: Secondary | ICD-10-CM | POA: Insufficient documentation

## 2022-06-14 DIAGNOSIS — B351 Tinea unguium: Secondary | ICD-10-CM

## 2022-06-14 DIAGNOSIS — Z794 Long term (current) use of insulin: Secondary | ICD-10-CM | POA: Diagnosis not present

## 2022-06-14 DIAGNOSIS — M79674 Pain in right toe(s): Secondary | ICD-10-CM

## 2022-06-14 DIAGNOSIS — M79675 Pain in left toe(s): Secondary | ICD-10-CM | POA: Diagnosis not present

## 2022-06-14 MED ORDER — SODIUM CHLORIDE 0.9 % IV SOLN
510.0000 mg | INTRAVENOUS | Status: AC
  Administered 2022-06-14: 510 mg via INTRAVENOUS
  Filled 2022-06-14: qty 510

## 2022-06-15 ENCOUNTER — Other Ambulatory Visit: Payer: Self-pay | Admitting: *Deleted

## 2022-06-15 DIAGNOSIS — N186 End stage renal disease: Secondary | ICD-10-CM

## 2022-06-19 ENCOUNTER — Encounter: Payer: Self-pay | Admitting: Podiatry

## 2022-06-19 NOTE — Progress Notes (Signed)
  Subjective:  Patient ID: [name], female    DOB: [birth year],  MRN: [id number]  [name] presents to clinic today for at risk foot care with history of diabetic neuropathy and painful thick toenails that are difficult to trim. Pain interferes with ambulation. Aggravating factors include wearing enclosed shoe gear. Pain is relieved with periodic professional debridement.  Chief Complaint  Patient presents with   Nail Problem    RFC Bilateral nail trim.    New problem(s): None.   PCP is [name], MD , and last visit was May 25, 2022.  Allergies  Allergen Reactions   Bee Pollen Other (See Comments)    Seasonal Allergies   Dust Mite Extract Cough    Sneezing & Cough   Mixed Ragweed Itching    Review of Systems: Negative except as noted in the HPI.  Objective: No changes noted in today's physical examination.  [name] is a pleasant 51 y.o. female in NAD. AAO x 3. Vascular Examination: CFT <3 seconds b/l LE. Palpable DP pulse(s) b/l LE. Palpable PT pulse(s) b/l LE. Pedal hair absent. No pain with calf compression b/l. Lower extremity skin temperature gradient within normal limits. No edema noted b/l LE. No ischemia or gangrene noted b/l LE. No cyanosis or clubbing noted b/l LE.  Dermatological Examination: Pedal skin is warm and supple b/l LE. No open wounds b/l LE. No interdigital macerations noted b/l LE. Toenails 1-5 left, 2-5 right elongated, discolored, dystrophic, thickened, and crumbly with subungual debris and tenderness to dorsal palpation.   Hyperkeratotic lesion(s) dorsal PIPJ of bilateral 5th toes.  No erythema, no edema, no drainage, no fluctuance.  Neurological Examination: Pt has subjective symptoms of neuropathy. Protective sensation intact 5/5 intact bilaterally with 10g monofilament b/l. Vibratory sensation intact b/l.  Musculoskeletal Examination: Normal muscle strength 5/5 to all lower extremity muscle groups bilaterally.  Lower extremity amputation(s): partial amputation of R hallux. Hammertoe(s) noted to the 2-5 bilaterally. No pain, crepitus or joint limitation noted with ROM b/l LE.  Patient ambulates independently without assistive aids.  Assessment/Plan: 1. Pain due to onychomycosis of toenails of both feet   2. Status post amputation of great toe, right ([place])   3. Type 2 diabetes mellitus with diabetic polyneuropathy, with long-term current use of insulin (HCC)     No orders of the defined types were placed in this encounter.   -Consent given for treatment as described below: -Examined patient. -Continue supportive shoe gear daily. -Toenails 1-5 b/l were debrided in length and girth with sterile nail nippers and dremel without iatrogenic bleeding.  -As a courtesy, corn(s)  bilateral 5th toes gently filed without complication or incident. Total number pared=2. -Patient/POA to call should there be question/concern in the interim.   Return in about 3 months (around 09/14/2022).  Marzetta Board, DPM

## 2022-06-20 ENCOUNTER — Ambulatory Visit: Payer: Medicaid Other | Admitting: "Endocrinology

## 2022-06-22 ENCOUNTER — Encounter: Payer: Self-pay | Admitting: Vascular Surgery

## 2022-06-22 ENCOUNTER — Ambulatory Visit (HOSPITAL_COMMUNITY)
Admission: RE | Admit: 2022-06-22 | Discharge: 2022-06-22 | Disposition: A | Discharge status: Home / Self Care | Payer: Medicaid Other | Source: Ambulatory Visit | Attending: Vascular Surgery | Admitting: Vascular Surgery

## 2022-06-22 ENCOUNTER — Ambulatory Visit: Payer: Medicaid Other | Admitting: Vascular Surgery

## 2022-06-22 ENCOUNTER — Ambulatory Visit (INDEPENDENT_AMBULATORY_CARE_PROVIDER_SITE_OTHER)
Admission: RE | Admit: 2022-06-22 | Discharge: 2022-06-22 | Disposition: A | Discharge status: Home / Self Care | Payer: Medicaid Other | Source: Ambulatory Visit | Attending: Vascular Surgery | Admitting: Vascular Surgery

## 2022-06-22 VITALS — BP 230/130 | HR 85 | Temp 98.3°F | Resp 20 | Ht 69.0 in | Wt 245.0 lb

## 2022-06-22 DIAGNOSIS — N186 End stage renal disease: Secondary | ICD-10-CM | POA: Diagnosis present

## 2022-06-22 DIAGNOSIS — N185 Chronic kidney disease, stage 5: Secondary | ICD-10-CM

## 2022-06-22 NOTE — Progress Notes (Signed)
Patient ID: [name], female   DOB: 04/29/1971, 51 y.o.   MRN: [id number]  Reason for Consult: New Patient (Initial Visit)   Referred by Corliss Parish, MD  Subjective:     HPI:  [name] is a 51 y.o. female with chronic kidney disease.  She does have a family member that was previously on dialysis but she has never been on dialysis herself.  She is right-hand dominant.  She does not take any blood thinners.  She continues to work in transportation.  No history of upper extremity, chest or breast surgery.  Past Medical History:  Diagnosis Date   Arthritis    Asthma    Cataract    Mixed form OU   CKD (chronic kidney disease)    Coronary artery disease    Diabetes mellitus    Diabetic retinopathy (HCC)    NPDR OU   Hyperlipidemia    Hypertension    Hypertensive retinopathy    OU   Left thyroid nodule    diagnosed 07/2018   PAC (premature atrial contraction) 02/15/2021   Pseudotumor cerebri    Stroke ([place])    Vitamin D deficiency    Family History  Problem Relation Age of Onset   Asthma Mother    Diabetes Father    Hypertension Father    Kidney disease Father    Heart attack Father    Heart failure Father    Hypertension Sister    Asthma Sister    Congestive Heart Failure Maternal Aunt    Diabetes Maternal Grandmother    Congestive Heart Failure Maternal Grandmother    Lung disease Maternal Grandmother    Asthma Other    Hyperlipidemia Other    Hypertension Other    Cancer Other    Past Surgical History:  Procedure Laterality Date   ACHILLES TENDON REPAIR Right    ACHILLES TENDON SURGERY Left 02/19/2021   Procedure: ACHILLES TENDON REPAIR WITH GRAFT;  Surgeon: Felipa Furnace, DPM;  Location: [place];  Service: Podiatry;  Laterality: Left;  BLOCK   AMPUTATION TOE Right 05/28/2021   Procedure: AMPUTATION TOE;  Surgeon: Lorenda Peck, MD;  Location: [place];  Service: Podiatry;  Laterality: Right;  Surgical team will do  block   GASTROC RECESSION EXTREMITY Left 02/19/2021   Procedure: GASTROC RECESSION EXTREMITY;  Surgeon: Felipa Furnace, DPM;  Location: [place];  Service: Podiatry;  Laterality: Left;  Block   TUBAL LIGATION      Short Social History:  Social History   Tobacco Use   Smoking status: Former    Packs/day: 0.50    Years: 30.00    Total pack years: 15.00    Types: Cigarettes    Quit date: 2017    Years since quitting: 6.8   Smokeless tobacco: Never  Substance Use Topics   Alcohol use: Not Currently    Alcohol/week: 0.0 standard drinks of alcohol    Comment: rare    Allergies  Allergen Reactions   Bee Pollen Other (See Comments)    Seasonal Allergies   Dust Mite Extract Cough    Sneezing & Cough   Mixed Ragweed Itching    Current Outpatient Medications  Medication Sig Dispense Refill   Accu-Chek FastClix Lancets MISC 4 (four) times daily.     ACCU-CHEK GUIDE test strip 1 each by Other route See admin instructions. for testing 100 each 2   albuterol (VENTOLIN HFA) 108 (90 Base) MCG/ACT inhaler Inhale 1-2  puffs into the lungs every 6 (six) hours as needed for wheezing or shortness of breath.     amLODipine (NORVASC) 10 MG tablet Take 1 tablet (10 mg total) by mouth daily. 90 tablet 1   aspirin 81 MG EC tablet Take 1 tablet (81 mg total) by mouth daily. 90 tablet 2   blood glucose meter kit and supplies KIT Dispense based on patient and insurance preference. Use up to four times daily as directed. (FOR ICD-9 250.00, 250.01). 1 each 0   busPIRone (BUSPAR) 5 MG tablet Take 5 mg by mouth 2 (two) times daily.     cloNIDine (CATAPRES - DOSED IN MG/24 HR) 0.3 mg/24hr patch Place 1 patch (0.3 mg total) onto the skin once a week. (Patient taking differently: Place 0.3 mg onto the skin every Wednesday.) 4 patch 12   Continuous Blood Gluc Receiver (DEXCOM G7 RECEIVER) DEVI Use to monitor BG continuously 1 each 0   Continuous Blood Gluc Sensor (DEXCOM G7 SENSOR) MISC Change  sensor every 10 days 3 each 2   EPINEPHrine 0.3 mg/0.3 mL IJ SOAJ injection Inject 0.3 mg into the muscle as needed for anaphylaxis. 2 each 0   fenofibrate (TRICOR) 48 MG tablet Take 48 mg by mouth daily.     fluticasone (FLONASE) 50 MCG/ACT nasal spray Place 2 sprays into both nostrils daily as needed for allergies or rhinitis.     glipiZIDE (GLUCOTROL XL) 5 MG 24 hr tablet Take 5 mg by mouth daily with breakfast.     hydrOXYzine (VISTARIL) 25 MG capsule Take 25 mg by mouth at bedtime.     insulin aspart protamine - aspart (NOVOLOG 70/30 MIX) (70-30) 100 UNIT/ML FlexPen Inject 20 Units into the skin 2 (two) times daily with a meal.     Insulin Syringes, Disposable, P-329 1 ML MISC 1 application by Does not apply route 2 (two) times a day. 100 each 0   ipratropium-albuterol (DUONEB) 0.5-2.5 (3) MG/3ML SOLN Take 3 mLs by nebulization every 4 (four) hours as needed. 360 mL 0   isosorbide-hydrALAZINE (BIDIL) 20-37.5 MG tablet Take 1 tablet by mouth 3 (three) times daily. 270 tablet 1   labetalol (NORMODYNE) 300 MG tablet Take 300 mg by mouth 2 (two) times daily.     rosuvastatin (CRESTOR) 40 MG tablet Take 40 mg by mouth daily.     No current facility-administered medications for this visit.    Review of Systems  Constitutional:  Constitutional negative. HENT: HENT negative.  Eyes: Eyes negative.  Respiratory: Respiratory negative.  Cardiovascular: Cardiovascular negative.  GI: Gastrointestinal negative.  Musculoskeletal: Musculoskeletal negative.  Skin: Skin negative.  Neurological: Neurological negative. Hematologic: Hematologic/lymphatic negative.  Psychiatric: Psychiatric negative.        Objective:  Objective  Vitals:   06/22/22 1335  BP: (!) 230/130  Pulse: 85  Resp: 20  Temp: 98.3 F (36.8 C)  SpO2: 95%     Physical Exam HENT:     Head: Normocephalic.     Nose: Nose normal.  Eyes:     Pupils: Pupils are equal, round, and reactive to light.  Cardiovascular:      Rate and Rhythm: Normal rate.     Pulses: Normal pulses.  Pulmonary:     Effort: Pulmonary effort is normal.  Abdominal:     General: Abdomen is flat.     Palpations: Abdomen is soft.  Musculoskeletal:        General: Normal range of motion.     Cervical back: Normal  range of motion and neck supple.     Right lower leg: No edema.     Left lower leg: No edema.  Skin:    General: Skin is warm.     Capillary Refill: Capillary refill takes less than 2 seconds.  Neurological:     General: No focal deficit present.     Mental Status: She is alert.  Psychiatric:        Mood and Affect: Mood normal.        Behavior: Behavior normal.        Thought Content: Thought content normal.        Judgment: Judgment normal.    Data:  Right Cephalic   Diameter (cm)Depth (cm)Findings   +-----------------+-------------+----------+---------+  Shoulder            0.43                          +-----------------+-------------+----------+---------+  Prox upper arm       0.39                          +-----------------+-------------+----------+---------+  Mid upper arm        0.41                          +-----------------+-------------+----------+---------+  Dist upper arm       0.47                          +-----------------+-------------+----------+---------+  Antecubital fossa    0.48               branching  +-----------------+-------------+----------+---------+  Prox forearm         0.31                          +-----------------+-------------+----------+---------+  Mid forearm          0.31                          +-----------------+-------------+----------+---------+  Dist forearm         0.32                          +-----------------+-------------+----------+---------+   +-----------------+-------------+----------+--------+  Right Basilic    Diameter (cm)Depth (cm)Findings  +-----------------+-------------+----------+--------+   Mid upper arm        0.36                         +-----------------+-------------+----------+--------+  Dist upper arm       0.41                         +-----------------+-------------+----------+--------+  Antecubital fossa    0.26                         +-----------------+-------------+----------+--------+   +-----------------+-------------+----------+---------+  Left Cephalic    Diameter (cm)Depth (cm)Findings   +-----------------+-------------+----------+---------+  Shoulder            0.58                          +-----------------+-------------+----------+---------+  Prox upper arm  0.49               branching  +-----------------+-------------+----------+---------+  Mid upper arm        0.48                          +-----------------+-------------+----------+---------+  Dist upper arm       0.64                          +-----------------+-------------+----------+---------+  Antecubital fossa    0.60                          +-----------------+-------------+----------+---------+  Prox forearm         0.30               branching  +-----------------+-------------+----------+---------+  Mid forearm          0.21                          +-----------------+-------------+----------+---------+  Dist forearm         0.30                          +-----------------+-------------+----------+---------+   +-----------------+-------------+----------+--------+  Left Basilic     Diameter (cm)Depth (cm)Findings  +-----------------+-------------+----------+--------+  Mid upper arm        0.39                         +-----------------+-------------+----------+--------+  Dist upper arm       0.38                         +-----------------+-------------+----------+--------+  Antecubital fossa    0.39                         +-----------------+-------------+----------+--------+   Summary: Right:  Patent and compressible cephalic and basilic veins.  Left: Patent and compressible cephalic and basilic veins.  Right Pre-Dialysis Findings:  +-----------------------+----------+--------------------+---------+--------  +  Location              PSV (cm/s)Intralum. Diam. (cm)Waveform  Comments  +-----------------------+----------+--------------------+---------+--------  +  Brachial Antecub. fossa132       0.44                triphasic           +-----------------------+----------+--------------------+---------+--------  +  Radial Art at Wrist    99        0.33                triphasic           +-----------------------+----------+--------------------+---------+--------  +  Ulnar Art at Wrist     74        0.24                triphasic           +-----------------------+----------+--------------------+---------+--------   Left Pre-Dialysis Findings:  +------------------+----------+-------------------+---------+--------------  ----+  Location         PSV (cm/s)Intralum. DiamTressia Danas Comments                                         (  cm)                                             +------------------+----------+-------------------+---------+--------------  ----+  Brachial Antecub. 152       0.37               triphasicbrachial  artery     fossa                                                   bifurcates  near                                                             axilla               +------------------+----------+-------------------+---------+--------------  ----+  Radial Art at     117       0.32               triphasic                     Wrist                                                                        +------------------+----------+-------------------+---------+--------------  ----+  Ulnar Art at Wrist69        0.15               triphasic                      +------------------+----------+-------------------+---------+--------------  ----+     Summary:    Right: No obstruction visualized in the right upper extremity.  Left: No obstruction visualized in the left upper extremity.      Assessment/Plan:    51 year old female advanced chronic kidney disease right-hand-dominant with satisfactory left upper extremity veins by ultrasound today.  Plan will be for left upper extremity fistula versus graft in the near future.  We discussed the risk benefits alternatives as well as the risk of primary nonfunction and she demonstrates good understanding we will get her scheduled ASAP.     Waynetta Sandy MD [facility]

## 2022-06-24 ENCOUNTER — Other Ambulatory Visit: Payer: Self-pay

## 2022-06-24 ENCOUNTER — Telehealth: Payer: Self-pay

## 2022-06-24 DIAGNOSIS — N185 Chronic kidney disease, stage 5: Secondary | ICD-10-CM

## 2022-06-24 NOTE — Telephone Encounter (Signed)
Tried to return pt's call regarding lab work. Left a message requesting pt return call to the office.

## 2022-06-28 ENCOUNTER — Encounter (HOSPITAL_COMMUNITY): Payer: Medicaid Other

## 2022-06-30 LAB — LIPID PANEL
Chol/HDL Ratio: 4.6 ratio — ABNORMAL HIGH (ref 0.0–4.4)
Cholesterol, Total: 194 mg/dL (ref 100–199)
HDL: 42 mg/dL (ref >39)
LDL Chol Calc (NIH): 76 mg/dL (ref 0–99)
Triglycerides: 480 mg/dL — ABNORMAL HIGH (ref 0–149)
VLDL Cholesterol Cal: 76 mg/dL — ABNORMAL HIGH (ref 5–40)

## 2022-06-30 LAB — COMPREHENSIVE METABOLIC PANEL
ALT: 9 IU/L (ref 0–32)
AST: 11 IU/L (ref 0–40)
Albumin/Globulin Ratio: 1.2 (ref 1.2–2.2)
Albumin: 4.1 g/dL (ref 3.9–4.9)
Alkaline Phosphatase: 107 IU/L (ref 44–121)
BUN/Creatinine Ratio: 11 (ref 9–23)
BUN: 46 mg/dL — ABNORMAL HIGH (ref 6–24)
Bilirubin Total: 0.3 mg/dL (ref 0.0–1.2)
CO2: 20 mmol/L (ref 20–29)
Calcium: 9.1 mg/dL (ref 8.7–10.2)
Chloride: 92 mmol/L — ABNORMAL LOW (ref 96–106)
Creatinine, Ser: 4.05 mg/dL — ABNORMAL HIGH (ref 0.57–1.00)
Globulin, Total: 3.4 g/dL (ref 1.5–4.5)
Glucose: 597 mg/dL (ref 70–99)
Potassium: 4 mmol/L (ref 3.5–5.2)
Sodium: 132 mmol/L — ABNORMAL LOW (ref 134–144)
Total Protein: 7.5 g/dL (ref 6.0–8.5)
eGFR: 13 mL/min/{1.73_m2} — ABNORMAL LOW (ref >59)

## 2022-06-30 LAB — T4, FREE: Free T4: 1.22 ng/dL (ref 0.82–1.77)

## 2022-06-30 LAB — TSH: TSH: 1.88 u[IU]/mL (ref 0.450–4.500)

## 2022-06-30 LAB — T3, FREE: T3, Free: 2.4 pg/mL (ref 2.0–4.4)

## 2022-07-04 ENCOUNTER — Telehealth: Payer: Self-pay

## 2022-07-04 DIAGNOSIS — E139 Other specified diabetes mellitus without complications: Secondary | ICD-10-CM

## 2022-07-04 MED ORDER — ACCU-CHEK FASTCLIX LANCETS MISC: 1.0000 | Freq: Four times a day (QID) | 2 refills | Status: DC

## 2022-07-04 MED ORDER — ACCU-CHEK GUIDE ME W/DEVICE KIT: PACK | 0 refills | Status: DC

## 2022-07-04 MED ORDER — ACCU-CHEK GUIDE VI STRP: 1.0000 | ORAL_STRIP | Freq: Four times a day (QID) | 2 refills | Status: DC

## 2022-07-04 NOTE — Telephone Encounter (Signed)
-----   Message from Cassandria Anger, MD sent at 07/04/2022  8:27 AM EST ----- Paxton Binns , There was a critical high glucose on the 22nd on this patient. Would you call her and assess, have her check it and let us know.  Thanks. ----- Message ----- From: Lavone Neri Lab Results In Sent: 06/30/2022   5:37 AM EST To: Cassandria Anger, MD

## 2022-07-04 NOTE — Telephone Encounter (Signed)
Spoke with pt, she stated she does not have test strips nor lancets to test her BG. Advised pt I would send in Rx's immediately to her pharmacy, requested she pick her equipment up asap and test her BG and call our office back with reading and from there to test her BG four times daily before meals and at bedtime. Pt will be scheduled for follow up appt. Pt voiced understanding and stated she would call the office back asap. Rx's for accu-chek guide meter, lancets and test strips sent to [facility] on [address].

## 2022-07-06 ENCOUNTER — Ambulatory Visit (HOSPITAL_COMMUNITY)
Admission: RE | Admit: 2022-07-06 | Discharge: 2022-07-06 | Disposition: A | Discharge status: Home / Self Care | Payer: Medicaid Other | Source: Ambulatory Visit | Attending: Nephrology | Admitting: Nephrology

## 2022-07-06 VITALS — BP 182/96 | HR 86 | Temp 97.0°F | Resp 18

## 2022-07-06 DIAGNOSIS — D631 Anemia in chronic kidney disease: Secondary | ICD-10-CM | POA: Diagnosis not present

## 2022-07-06 DIAGNOSIS — N184 Chronic kidney disease, stage 4 (severe): Secondary | ICD-10-CM | POA: Insufficient documentation

## 2022-07-06 DIAGNOSIS — N179 Acute kidney failure, unspecified: Secondary | ICD-10-CM | POA: Insufficient documentation

## 2022-07-06 LAB — FERRITIN: Ferritin: 496 ng/mL — ABNORMAL HIGH (ref 11–307)

## 2022-07-06 LAB — POCT HEMOGLOBIN-HEMACUE: Hemoglobin: 10.6 g/dL — ABNORMAL LOW (ref 12.0–15.0)

## 2022-07-06 LAB — IRON AND TIBC
Iron: 88 ug/dL (ref 28–170)
Saturation Ratios: 28 % (ref 10.4–31.8)
TIBC: 318 ug/dL (ref 250–450)
UIBC: 230 ug/dL

## 2022-07-06 MED ORDER — EPOETIN ALFA-EPBX 10000 UNIT/ML IJ SOLN: 10000.0000 [IU] | INTRAMUSCULAR | Status: DC

## 2022-07-06 MED ORDER — EPOETIN ALFA-EPBX 10000 UNIT/ML IJ SOLN
INTRAMUSCULAR | Status: AC
  Administered 2022-07-06: 10000 [IU] via SUBCUTANEOUS
  Filled 2022-07-06: qty 1

## 2022-07-13 ENCOUNTER — Ambulatory Visit: Payer: Medicaid Other | Admitting: Cardiology

## 2022-07-15 ENCOUNTER — Ambulatory Visit: Payer: Medicaid Other | Admitting: Cardiology

## 2022-07-18 NOTE — Progress Notes (Unsigned)
Patient referred by [name], MD for hypertensive heart disease  Subjective:   [name], female    DOB: Nov 26, 1970, 51 y.o.   MRN: [id number]   No chief complaint on file.   51 y.o. African-American female with hypertension, uncontrolled type 2 diabetes mellitus, hyperlipidemia, tobacco abuse, recurrent strokes.  Patient was in ER in 05/2022 with AKI and elevaed BG. She has since undergone LUE AVF placement in anticipation of dialysis  Patient is scheduled to undergo cervical spinal surgery next week. Patient is finally compliant with her medications.  With this, blood pressure is very well controlled.  She does not have any complaints today.    Current Outpatient Medications:    Accu-Chek FastClix Lancets MISC, 1 each by Other route 4 (four) times daily., Disp: 100 each, Rfl: 2   ACCU-CHEK GUIDE test strip, 1 each by Other route 4 (four) times daily. for testing, Disp: 100 each, Rfl: 2   albuterol (VENTOLIN HFA) 108 (90 Base) MCG/ACT inhaler, Inhale 1-2 puffs into the lungs every 6 (six) hours as needed for wheezing or shortness of breath., Disp: , Rfl:    amLODipine (NORVASC) 10 MG tablet, Take 1 tablet (10 mg total) by mouth daily., Disp: 90 tablet, Rfl: 1   aspirin 81 MG EC tablet, Take 1 tablet (81 mg total) by mouth daily., Disp: 90 tablet, Rfl: 2   blood glucose meter kit and supplies KIT, Dispense based on patient and insurance preference. Use up to four times daily as directed. (FOR ICD-9 250.00, 250.01)., Disp: 1 each, Rfl: 0   Blood Glucose Monitoring Suppl (ACCU-CHEK GUIDE ME) w/Device KIT, Use to check blood glucose four times daily, Disp: 1 kit, Rfl: 0   busPIRone (BUSPAR) 5 MG tablet, Take 5 mg by mouth 2 (two) times daily., Disp: , Rfl:    cloNIDine (CATAPRES - DOSED IN MG/24 HR) 0.3 mg/24hr patch, Place 1 patch (0.3 mg total) onto the skin once a week. (Patient taking differently: Place 0.3 mg onto the skin every Wednesday.), Disp: 4 patch, Rfl: 12    Continuous Blood Gluc Receiver (Tuttle) DEVI, Use to monitor BG continuously, Disp: 1 each, Rfl: 0   Continuous Blood Gluc Sensor (DEXCOM G7 SENSOR) MISC, Change sensor every 10 days, Disp: 3 each, Rfl: 2   EPINEPHrine 0.3 mg/0.3 mL IJ SOAJ injection, Inject 0.3 mg into the muscle as needed for anaphylaxis., Disp: 2 each, Rfl: 0   fenofibrate (TRICOR) 48 MG tablet, Take 48 mg by mouth daily., Disp: , Rfl:    fluticasone (FLONASE) 50 MCG/ACT nasal spray, Place 2 sprays into both nostrils daily as needed for allergies or rhinitis., Disp: , Rfl:    glipiZIDE (GLUCOTROL XL) 5 MG 24 hr tablet, Take 5 mg by mouth daily with breakfast., Disp: , Rfl:    hydrOXYzine (VISTARIL) 25 MG capsule, Take 25 mg by mouth at bedtime., Disp: , Rfl:    insulin aspart protamine - aspart (NOVOLOG 70/30 MIX) (70-30) 100 UNIT/ML FlexPen, Inject 20 Units into the skin 2 (two) times daily with a meal., Disp: , Rfl:    Insulin Syringes, Disposable, U-100 1 ML MISC, 1 application by Does not apply route 2 (two) times a day., Disp: 100 each, Rfl: 0   ipratropium-albuterol (DUONEB) 0.5-2.5 (3) MG/3ML SOLN, Take 3 mLs by nebulization every 4 (four) hours as needed., Disp: 360 mL, Rfl: 0   isosorbide-hydrALAZINE (BIDIL) 20-37.5 MG tablet, Take 1 tablet by mouth 3 (three) times daily., Disp: 270  tablet, Rfl: 1   labetalol (NORMODYNE) 300 MG tablet, Take 300 mg by mouth 2 (two) times daily., Disp: , Rfl:    rosuvastatin (CRESTOR) 40 MG tablet, Take 40 mg by mouth daily., Disp: , Rfl:     Cardiovascular studies:  EKG 01/11/2022: Sinus rhythm 68 bpm Old anteroseptal infarct Lateral R wave inversion, consider ischemia  Echocardiogram 07/27/2021:  Left ventricle cavity is normal in size. 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 Max gradient 10 mmHg.    Normal global wall motion. Normal LV systolic function with EF 55%.  Indeterminate diastolic filling pattern.  Structurally  normal trileaflet aortic valve. No evidence of aortic  stenosis. Moderate (Grade II) aortic regurgitation.  Mild (Grade I) mitral regurgitation.  Mild tricuspid regurgitation. Estimated pulmonary artery systolic pressure  27 mmHg.  Mild pulmonic regurgitation.  No significant change compared to previous study on 10/16/2019.  Event monitor 09/17/2019 - 10/16/2019: Diagnostic time: 94%  Dominant rhythm: Sinus. HR 66-112 bpm. Avg HR 80 bpm. 6 episodes of NSVT up to 9 beats seen.  No atrial fibrillation/atrial flutter/SVT/high grade AV block, sinus pause >3sec noted. Symptoms reported: None  Recent labs: 06/29/2022: Glucose 597, BUN/Cr 46/4.05. EGFR 13. Na/K 132/4.0. Rest of the CMP normal Chol 194, TG 480, HDL 42, LDL 76 TSH 1.8 normal  04/22/2022: H/H 10/31. MCV 88. Platelets 203 HbA1C 7.9%   Review of Systems  Cardiovascular:  Positive for dyspnea on exertion. Negative for chest pain, leg swelling, palpitations and syncope.  Genitourinary:  Negative for dysuria.  Neurological:  Positive for numbness.       Foot drop  All other systems reviewed and are negative.        There were no vitals filed for this visit.    Objective:   Physical Exam Vitals and nursing note reviewed.  Constitutional:      General: She is not in acute distress. HENT:     Head: Normocephalic and atraumatic.  Neck:     Vascular: No JVD.  Cardiovascular:     Rate and Rhythm: Normal rate and regular rhythm.     Pulses: Intact distal pulses.     Heart sounds: Murmur heard.     Early systolic murmur is present with a grade of 2/6 at the lower right sternal border.  Pulmonary:     Effort: Pulmonary effort is normal.     Breath sounds: Normal breath sounds. No rales.  Neurological:     Cranial Nerves: No cranial nerve deficit.           Assessment & Recommendations:   51 y.o. 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  *** Resistant hypertension: Now very well controlled. Continue current antihypertensive therapy. No change made today.   H/o stroke: No Afib on event monitor.  Continue Aspirin, statin.  Type 2 diabetes mellitus: Uncontrolled.  Management as per PCP.  F/u 6 months   Nigel Mormon, MD Pager: [phone] Office: [phone]

## 2022-07-20 ENCOUNTER — Ambulatory Visit: Payer: Medicaid Other | Admitting: Cardiology

## 2022-07-20 ENCOUNTER — Encounter: Payer: Self-pay | Admitting: Cardiology

## 2022-07-20 VITALS — BP 143/79 | HR 78 | Resp 12 | Ht 69.0 in | Wt 241.8 lb

## 2022-07-20 DIAGNOSIS — I1A0 Resistant hypertension: Secondary | ICD-10-CM

## 2022-07-26 ENCOUNTER — Encounter (HOSPITAL_COMMUNITY): Payer: Medicaid Other

## 2022-07-27 ENCOUNTER — Other Ambulatory Visit: Payer: Self-pay

## 2022-07-27 ENCOUNTER — Encounter (HOSPITAL_COMMUNITY): Payer: Self-pay | Admitting: Vascular Surgery

## 2022-07-27 NOTE — Progress Notes (Signed)
S.D.W- Instructions   Your procedure is scheduled on Fri., Dec. 22, 2023 from 7:30AM-8:40AM.  Report to [facility] Main Entrance "A" at 5:30 A.M., then check in with the Admitting office.  Call this number if you have problems the morning of surgery:  [phone]             If you experience any cold or flu symptoms such as cough, fever, chills, shortness of breath, etc. between now and your scheduled surgery, please notify us at the above         number.  Masks are now required throughout our facilities due to the increasing cases of Covid, Flu, and RSV infections.   Remember:  Do not eat  or drink after midnight on Dec. 21st    Take these medicines the morning of surgery with A SIP OF WATER: AmLODipine (NORVASC)  Aspirin  Fenofibrate (TRICOR)  Isosorbide-hydrALAZINE (BIDIL)  Labetalol (NORMODYNE)  Rosuvastatin (CRESTOR)   As of today, STOP taking any Aspirin (unless otherwise instructed by your surgeon) Aleve, Naproxen, Ibuprofen, Motrin, Advil, Goody's, BC's, all herbal medications, fish oil, and all vitamins.   How to Manage Your Diabetes Before and After Surgery  How do I manage my blood sugar before surgery? Check your blood sugar at least 4 times a day, starting 2 days before surgery, to make sure that the level is not too high or low. Check your blood sugar the morning of your surgery when you wake up and every 2 hours until you get to the Short Stay unit. If your blood sugar is less than 70 mg/dL, you will need to treat for low blood sugar: Do not take insulin. Treat a low blood sugar (less than 70 mg/dL) with  cup of clear juice (cranberry or apple), 4 glucose tablets, OR glucose gel. Recheck blood sugar in 15 minutes after treatment (to make sure it is greater than 70 mg/dL). If your blood sugar is not greater than 70 mg/dL on recheck, call [phone]  for further instructions. Report your blood sugar to the short stay nurse when you get to Short Stay.  WHAT DO I  DO ABOUT MY DIABETES MEDICATION?  Do not take GlipiZIDE (GLUCOTROL) the morning of surgery.  THE NIGHT BEFORE SURGERY, take _____14______ units of ______insulin Aspart (NOVOLOG 70/30 MIX) _____insulin.   Reviewed and Endorsed by [facility] Patient Education Committee, August 2015  Do not wear jewelry or makeup. Do not wear lotions, powders, perfumes or deodorant. Do not shave 48 hours prior to surgery.  Do not bring valuables to the hospital. Do not wear nail polish, gel polish, artificial nails, or any other type of covering on natural nails (fingers and toes) If you have artificial nails or gel coating that need to be removed by a nail salon, please have this removed prior to surgery. Artificial nails or gel coating may interfere with anesthesia's ability to adequately monitor your vital signs.   is not responsible for any belongings or valuables.    Do NOT Smoke (Tobacco/Vaping)  24 hours prior to your procedure  If you use a CPAP at night, you may bring your mask for your overnight stay.   Contacts, glasses, hearing aids, dentures or partials may not be worn into surgery, please bring cases for these belongings   For patients admitted to the hospital, discharge time will be determined by your treatment team.   Patients discharged the day of surgery will not be allowed to drive home, and someone needs to  stay with them for 24 hours.  Special instructions:    Oral Hygiene is also important to reduce your risk of infection.  Remember - BRUSH YOUR TEETH THE MORNING OF SURGERY WITH YOUR REGULAR TOOTHPASTE  [facility]- Preparing For Surgery  Before surgery, you can play an important role. Because skin is not sterile, your skin needs to be as free of germs as possible. You can reduce the number of germs on your skin by washing with Antibacterial Soap before surgery.     Please follow these instructions carefully.     Shower the NIGHT BEFORE SURGERY and the MORNING OF  SURGERY with Antibacterial Soap.   Pat yourself dry with a CLEAN TOWEL.  Wear CLEAN PAJAMAS to bed the night before surgery  Place CLEAN SHEETS on your bed the night before your surgery  DO NOT SLEEP WITH PETS.  Day of Surgery:  Take a shower with Antibacterial soap. Wear Clean/Comfortable clothing the morning of surgery Do not apply any deodorants/lotions.   Remember to brush your teeth WITH YOUR REGULAR TOOTHPASTE.   If you test positive for Covid, or been in contact with anyone that has tested positive in the last 10 days, please notify your surgeon.  SURGICAL WAITING ROOM VISITATION Patients having surgery or a procedure may have no more than 2 support people in the waiting area - these visitors may rotate.   Children under the age of 29 must have an adult with them who is not the patient. If the patient needs to stay at the hospital during part of their recovery, the visitor guidelines for inpatient rooms apply. Pre-op nurse will coordinate an appropriate time for 1 support person to accompany patient in pre-op.  This support person may not rotate.   Please refer to the [facility] website for the visitor guidelines for Inpatients (after your surgery is over and you are in a regular room).

## 2022-07-27 NOTE — Progress Notes (Signed)
PCP - Dr. Vista Lawman  Cardiologist - Dr. Virgina Jock  EP- Denies  Endocrine- Denies  Pulm- Denies  Chest x-ray - Denies  EKG - 07/20/22 (E)  Stress Test - Denies  ECHO - Denies  Cardiac Cath - Denies  AICD-na PM-na LOOP-na  Nerve Stimulator- Denies  Dialysis- Denies  Sleep Study - Yes- Positive CPAP - Yes  LABS- 07/29/22: I-Stat-8  ASA- Cont.  ERAS- No  HA1C- 04/22/22(E): 7.9 Fasting Blood Sugar - 0 Checks Blood Sugar __0___ times a day- Pt states she still does not have the proper equipment to check her levels at home.  Anesthesia-  Yes- medical history  Pt denies having chest pain, sob, or fever during the pre-op phone call. All instructions explained to the pt, with a verbal understanding of the material. Pt also instructed to wear a mask and social distance if she goes out. The opportunity to ask questions was provided.

## 2022-07-28 NOTE — Progress Notes (Signed)
Anesthesia Chart Review: Same day workup  Pt was previously scheduled for ACDF in June of 2023, however preop labs were notable for declining renal function. I reached out to pt's PCP and case was cancelled for further eval. She is followed by nephrologist Dr. Moshe Cipro. Renal function has continued to decline, now requires AVF placement in anticipaiton of dialysis.  Follows with cardiology for history of cryptogenic CVA, resistant hypertension, HLD.  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  Most recent echo 07/2021 showed severe concentric hypertrophy of the left ventricle with mild LVOT obstruction, EF 55%, moderate aortic regurgitation, mild mitral regurgitation, mild tricuspid regurgitation, mild pulmonic regurgitation, no sudden change from prior study.  Seen by Dr. Virgina Jock 07/20/22. Per note, "Resistant hypertension: Generally improved. BP elevated today, but has advanced CKD with potential need fo dialysis in future. I would avoid precipitous drop in BP in favor of renal perfusion. Defer management to nephrology. Continue current antihypertensive therapy. No change made today."  OSA on CPAP.  IDDM2, last A1c 7.9 on 04/22/22.   Pt will need DOS labs and eval.  EKG 07/20/2022: Sinus rhythm 77 bpm Left atrial enlargement.  Diffuse T wave inversion likely due to LV strain   Echocardiogram 07/27/2021:  Left ventricle cavity is normal in size. 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 Max gradient 10 mmHg.    Normal global wall motion. Normal LV systolic function with EF 55%.  Indeterminate diastolic filling pattern.  Structurally normal trileaflet aortic valve. No evidence of aortic  stenosis. Moderate (Grade II) aortic regurgitation.  Mild (Grade I) mitral regurgitation.  Mild tricuspid regurgitation. Estimated pulmonary artery systolic pressure  27 mmHg.  Mild pulmonic regurgitation.  No significant change compared to  previous study on 10/16/2019.   Event monitor 09/17/2019 - 10/16/2019: Diagnostic time: 94%  Dominant rhythm: Sinus. HR 66-112 bpm. Avg HR 80 bpm. 6 episodes of NSVT up to 9 beats seen.  No atrial fibrillation/atrial flutter/SVT/high grade AV block, sinus pause >3sec noted. Symptoms reported: None   Wynonia Musty [facility] Short Stay Center/Anesthesiology Phone [phone] 07/28/2022 9:50 AM

## 2022-07-28 NOTE — Anesthesia Preprocedure Evaluation (Addendum)
Anesthesia Evaluation  Patient identified by MRN, date of birth, ID band Patient awake    Reviewed: Allergy & Precautions, H&P , NPO status , [name] Chart, lab work & pertinent test results  Airway Mallampati: II  TM Distance: >3 FB Neck ROM: Full    Dental no notable dental hx.    Pulmonary sleep apnea , former smoker   Pulmonary exam normal breath sounds clear to auscultation       Cardiovascular hypertension, Pt. on medications + Valvular Problems/Murmurs AI and MR  Rhythm:Regular Rate:Normal + Systolic murmurs    Neuro/Psych CVA  negative psych ROS   GI/Hepatic negative GI ROS, Neg liver ROS,,,  Endo/Other  diabetes, Poorly Controlled  GLC 385, will give 20units [place] of novolog   Renal/GU Renal InsufficiencyRenal disease  negative genitourinary   Musculoskeletal negative musculoskeletal ROS (+)    Abdominal   Peds negative pediatric ROS (+)  Hematology negative hematology ROS (+)   Anesthesia Other Findings   Reproductive/Obstetrics negative OB ROS                             Anesthesia Physical Anesthesia Plan  ASA: 3  Anesthesia Plan: General   Post-op Pain Management: Minimal or no pain anticipated   Induction: Intravenous  PONV Risk Score and Plan: 3 and Ondansetron, Dexamethasone and Treatment may vary due to age or medical condition  Airway Management Planned: LMA  Additional Equipment:   Intra-op Plan:   Post-operative Plan: Extubation in OR  Informed Consent: I have reviewed the patients History and Physical, chart, labs and discussed the procedure including the risks, benefits and alternatives for the proposed anesthesia with the patient or authorized representative who has indicated his/her understanding and acceptance.     Dental advisory given  Plan Discussed with: CRNA and Surgeon  Anesthesia Plan Comments: (PAT note by Karoline Caldwell, PA-C: Pt was  previously scheduled for ACDF in June of 2023, however preop labs were notable for declining renal function. I reached out to pt's PCP and case was cancelled for further eval. She is followed by nephrologist Dr. Moshe Cipro. Renal function has continued to decline, now requires AVF placement in anticipaiton of dialysis.  Follows with cardiology for history of cryptogenic CVA, resistant hypertension, HLD. 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 Most recent echo 07/2021 showed severe concentric hypertrophy of the left ventricle with mild LVOT obstruction, EF 55%, moderate aortic regurgitation, mild mitral regurgitation, mild tricuspid regurgitation, mild pulmonic regurgitation, no sudden change from prior study. Seen by Dr. Virgina Jock 07/20/22. Per note, "Resistant hypertension: Generally improved. BP elevated today, but has advanced CKD with potential need fo dialysis in future. I would avoid precipitous drop in BP in favor of renal perfusion. Defer management to nephrology. Continue current antihypertensive therapy. No change made today."  OSA on CPAP.  IDDM2, last A1c 7.9 on 04/22/22.  Pt will need DOS labs and eval.  EKG 07/20/2022: Sinus rhythm 77 bpm Left atrial enlargement.  Diffuse T wave inversion likely due to LV strain  Echocardiogram 07/27/2021:  Left ventricle cavity is normal in size. 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 Max gradient 10 mmHg.  Normal global wall motion. Normal LV systolic function with EF 55%.  Indeterminate diastolic filling pattern.  Structurally normal trileaflet aortic valve. No evidence of aortic  stenosis. Moderate (Grade II) aortic regurgitation.  Mild (Grade I) mitral regurgitation.  Mild tricuspid regurgitation. Estimated  pulmonary artery systolic pressure  27 mmHg.  Mild pulmonic regurgitation.  No significant change compared to previous study on 10/16/2019.  Event monitor 09/17/2019  - 10/16/2019: Diagnostic time: 94%  Dominant rhythm: Sinus. HR 66-112 bpm. Avg HR 80 bpm. 6 episodes of NSVT up to 9 beats seen.  No atrial fibrillation/atrial flutter/SVT/high grade AV block, sinus pause >3sec noted. Symptoms reported: None  )        Anesthesia Quick Evaluation

## 2022-07-29 ENCOUNTER — Ambulatory Visit (HOSPITAL_COMMUNITY): Payer: Medicaid Other | Admitting: Physician Assistant

## 2022-07-29 ENCOUNTER — Other Ambulatory Visit: Payer: Self-pay

## 2022-07-29 ENCOUNTER — Encounter (HOSPITAL_COMMUNITY)
Admission: RE | Disposition: A | Discharge status: Home / Self Care | Payer: Self-pay | Source: Home / Self Care | Attending: Vascular Surgery

## 2022-07-29 ENCOUNTER — Encounter (HOSPITAL_COMMUNITY): Payer: Self-pay | Admitting: Vascular Surgery

## 2022-07-29 ENCOUNTER — Ambulatory Visit (HOSPITAL_BASED_OUTPATIENT_CLINIC_OR_DEPARTMENT_OTHER): Payer: Medicaid Other | Admitting: Physician Assistant

## 2022-07-29 ENCOUNTER — Ambulatory Visit (HOSPITAL_COMMUNITY)
Admission: RE | Admit: 2022-07-29 | Discharge: 2022-07-29 | Disposition: A | Discharge status: Home / Self Care | Payer: Medicaid Other | Attending: Vascular Surgery | Admitting: Vascular Surgery

## 2022-07-29 DIAGNOSIS — Z87891 Personal history of nicotine dependence: Secondary | ICD-10-CM | POA: Insufficient documentation

## 2022-07-29 DIAGNOSIS — E1165 Type 2 diabetes mellitus with hyperglycemia: Secondary | ICD-10-CM | POA: Insufficient documentation

## 2022-07-29 DIAGNOSIS — Z794 Long term (current) use of insulin: Secondary | ICD-10-CM | POA: Insufficient documentation

## 2022-07-29 DIAGNOSIS — G4733 Obstructive sleep apnea (adult) (pediatric): Secondary | ICD-10-CM | POA: Insufficient documentation

## 2022-07-29 DIAGNOSIS — E1122 Type 2 diabetes mellitus with diabetic chronic kidney disease: Secondary | ICD-10-CM | POA: Diagnosis not present

## 2022-07-29 DIAGNOSIS — N185 Chronic kidney disease, stage 5: Secondary | ICD-10-CM | POA: Diagnosis not present

## 2022-07-29 DIAGNOSIS — Z7984 Long term (current) use of oral hypoglycemic drugs: Secondary | ICD-10-CM | POA: Diagnosis not present

## 2022-07-29 DIAGNOSIS — I129 Hypertensive chronic kidney disease with stage 1 through stage 4 chronic kidney disease, or unspecified chronic kidney disease: Secondary | ICD-10-CM | POA: Insufficient documentation

## 2022-07-29 DIAGNOSIS — I120 Hypertensive chronic kidney disease with stage 5 chronic kidney disease or end stage renal disease: Secondary | ICD-10-CM | POA: Diagnosis not present

## 2022-07-29 DIAGNOSIS — Z8673 Personal history of transient ischemic attack (TIA), and cerebral infarction without residual deficits: Secondary | ICD-10-CM | POA: Diagnosis not present

## 2022-07-29 DIAGNOSIS — E785 Hyperlipidemia, unspecified: Secondary | ICD-10-CM | POA: Diagnosis not present

## 2022-07-29 DIAGNOSIS — Z79899 Other long term (current) drug therapy: Secondary | ICD-10-CM | POA: Insufficient documentation

## 2022-07-29 DIAGNOSIS — I1A0 Resistant hypertension: Secondary | ICD-10-CM | POA: Insufficient documentation

## 2022-07-29 DIAGNOSIS — R011 Cardiac murmur, unspecified: Secondary | ICD-10-CM | POA: Diagnosis not present

## 2022-07-29 DIAGNOSIS — N189 Chronic kidney disease, unspecified: Secondary | ICD-10-CM | POA: Diagnosis not present

## 2022-07-29 HISTORY — DX: Anemia, unspecified: D64.9

## 2022-07-29 HISTORY — DX: Sleep apnea, unspecified: G47.30

## 2022-07-29 HISTORY — PX: AV FISTULA PLACEMENT: SHX1204

## 2022-07-29 HISTORY — DX: Anxiety disorder, unspecified: F41.9

## 2022-07-29 HISTORY — DX: Depression, unspecified: F32.A

## 2022-07-29 LAB — GLUCOSE, CAPILLARY
Glucose-Capillary: 384 mg/dL — ABNORMAL HIGH (ref 70–99)
Glucose-Capillary: 379 mg/dL — ABNORMAL HIGH (ref 70–99)
Glucose-Capillary: 353 mg/dL — ABNORMAL HIGH (ref 70–99)
Glucose-Capillary: 341 mg/dL — ABNORMAL HIGH (ref 70–99)
Glucose-Capillary: 226 mg/dL — ABNORMAL HIGH (ref 70–99)

## 2022-07-29 LAB — POCT I-STAT, CHEM 8
BUN: 53 mg/dL — ABNORMAL HIGH (ref 6–20)
Calcium, Ion: 1.04 mmol/L — ABNORMAL LOW (ref 1.15–1.40)
Chloride: 97 mmol/L — ABNORMAL LOW (ref 98–111)
Creatinine, Ser: 5.2 mg/dL — ABNORMAL HIGH (ref 0.44–1.00)
Glucose, Bld: 407 mg/dL — ABNORMAL HIGH (ref 70–99)
HCT: 31 % — ABNORMAL LOW (ref 36.0–46.0)
Hemoglobin: 10.5 g/dL — ABNORMAL LOW (ref 12.0–15.0)
Potassium: 3.8 mmol/L (ref 3.5–5.1)
Sodium: 134 mmol/L — ABNORMAL LOW (ref 135–145)
TCO2: 23 mmol/L (ref 22–32)

## 2022-07-29 SURGERY — ARTERIOVENOUS (AV) FISTULA CREATION
Anesthesia: General | Site: Arm Upper | Laterality: Left

## 2022-07-29 MED ORDER — PAPAVERINE HCL 30 MG/ML IJ SOLN
INTRAMUSCULAR | Status: AC
  Filled 2022-07-29: qty 2

## 2022-07-29 MED ORDER — 0.9 % SODIUM CHLORIDE (POUR BTL) OPTIME
TOPICAL | Status: DC | PRN
  Administered 2022-07-29: 1000 mL

## 2022-07-29 MED ORDER — LIDOCAINE-EPINEPHRINE 2 %-1:100000 IJ SOLN
INTRAMUSCULAR | Status: DC | PRN
  Administered 2022-07-29: 15 mL via PERINEURAL

## 2022-07-29 MED ORDER — ORAL CARE MOUTH RINSE: 15.0000 mL | Freq: Once | OROMUCOSAL | Status: AC

## 2022-07-29 MED ORDER — PHENYLEPHRINE HCL-NACL 20-0.9 MG/250ML-% IV SOLN
INTRAVENOUS | Status: DC | PRN
  Administered 2022-07-29: 40 ug/min via INTRAVENOUS
  Administered 2022-07-29: 160 ug via INTRAVENOUS

## 2022-07-29 MED ORDER — PROPOFOL 500 MG/50ML IV EMUL
INTRAVENOUS | Status: DC | PRN
  Administered 2022-07-29: 125 ug/kg/min via INTRAVENOUS

## 2022-07-29 MED ORDER — ONDANSETRON HCL 4 MG/2ML IJ SOLN
INTRAMUSCULAR | Status: DC | PRN
  Administered 2022-07-29: 4 mg via INTRAVENOUS

## 2022-07-29 MED ORDER — FENTANYL CITRATE (PF) 100 MCG/2ML IJ SOLN
INTRAMUSCULAR | Status: AC
  Administered 2022-07-29: 50 ug via INTRAVENOUS
  Filled 2022-07-29: qty 2

## 2022-07-29 MED ORDER — HYDROCODONE-ACETAMINOPHEN 5-325 MG PO TABS: 1.0000 | ORAL_TABLET | Freq: Four times a day (QID) | ORAL | 0 refills | Status: DC | PRN

## 2022-07-29 MED ORDER — FENTANYL CITRATE (PF) 250 MCG/5ML IJ SOLN
INTRAMUSCULAR | Status: AC
  Filled 2022-07-29: qty 5

## 2022-07-29 MED ORDER — LIDOCAINE-EPINEPHRINE (PF) 1 %-1:200000 IJ SOLN
INTRAMUSCULAR | Status: AC
  Filled 2022-07-29: qty 30

## 2022-07-29 MED ORDER — CHLORHEXIDINE GLUCONATE 0.12 % MT SOLN
OROMUCOSAL | Status: AC
  Filled 2022-07-29: qty 15

## 2022-07-29 MED ORDER — HEPARIN 6000 UNIT IRRIGATION SOLUTION
Status: DC | PRN
  Administered 2022-07-29: 1

## 2022-07-29 MED ORDER — INSULIN ASPART 100 UNIT/ML IJ SOLN
10.0000 [IU] | Freq: Once | INTRAMUSCULAR | Status: AC
  Administered 2022-07-29: 10 [IU] via SUBCUTANEOUS
  Filled 2022-07-29: qty 1

## 2022-07-29 MED ORDER — DIPHENHYDRAMINE HCL 50 MG/ML IJ SOLN
25.0000 mg | Freq: Once | INTRAMUSCULAR | Status: AC
  Administered 2022-07-29: 25 mg via INTRAVENOUS

## 2022-07-29 MED ORDER — SODIUM CHLORIDE 0.9 % IV SOLN: INTRAVENOUS | Status: DC

## 2022-07-29 MED ORDER — DIPHENHYDRAMINE HCL 50 MG/ML IJ SOLN
INTRAMUSCULAR | Status: AC
  Filled 2022-07-29: qty 1

## 2022-07-29 MED ORDER — VASOPRESSIN 20 UNIT/ML IV SOLN
INTRAVENOUS | Status: DC | PRN
  Administered 2022-07-29: 1 [IU] via INTRAVENOUS

## 2022-07-29 MED ORDER — FENTANYL CITRATE (PF) 100 MCG/2ML IJ SOLN: 50.0000 ug | Freq: Once | INTRAMUSCULAR | Status: AC

## 2022-07-29 MED ORDER — PROPOFOL 10 MG/ML IV BOLUS
INTRAVENOUS | Status: AC
  Filled 2022-07-29: qty 20

## 2022-07-29 MED ORDER — MIDAZOLAM HCL 2 MG/2ML IJ SOLN
INTRAMUSCULAR | Status: AC
  Administered 2022-07-29: 1 mg via INTRAVENOUS
  Filled 2022-07-29: qty 2

## 2022-07-29 MED ORDER — FAMOTIDINE IN NACL 20-0.9 MG/50ML-% IV SOLN
20.0000 mg | Freq: Once | INTRAVENOUS | Status: DC
  Filled 2022-07-29: qty 50

## 2022-07-29 MED ORDER — CHLORHEXIDINE GLUCONATE 0.12 % MT SOLN
15.0000 mL | Freq: Once | OROMUCOSAL | Status: AC
  Administered 2022-07-29: 15 mL via OROMUCOSAL

## 2022-07-29 MED ORDER — MIDAZOLAM HCL 2 MG/2ML IJ SOLN
INTRAMUSCULAR | Status: AC
  Filled 2022-07-29: qty 2

## 2022-07-29 MED ORDER — PHENYLEPHRINE 80 MCG/ML (10ML) SYRINGE FOR IV PUSH (FOR BLOOD PRESSURE SUPPORT)
PREFILLED_SYRINGE | INTRAVENOUS | Status: DC | PRN
  Administered 2022-07-29: 160 ug via INTRAVENOUS
  Administered 2022-07-29: 320 ug via INTRAVENOUS

## 2022-07-29 MED ORDER — HEPARIN 6000 UNIT IRRIGATION SOLUTION
Status: AC
  Filled 2022-07-29: qty 500

## 2022-07-29 MED ORDER — INSULIN ASPART 100 UNIT/ML IJ SOLN: 20.0000 [IU] | Freq: Once | INTRAMUSCULAR | Status: AC

## 2022-07-29 MED ORDER — MEPIVACAINE HCL (PF) 2 % IJ SOLN
INTRAMUSCULAR | Status: DC | PRN
  Administered 2022-07-29: 15 mL

## 2022-07-29 MED ORDER — MIDAZOLAM HCL 2 MG/2ML IJ SOLN: 1.0000 mg | Freq: Once | INTRAMUSCULAR | Status: AC

## 2022-07-29 MED ORDER — CHLORHEXIDINE GLUCONATE 4 % EX LIQD: 60.0000 mL | Freq: Once | CUTANEOUS | Status: DC

## 2022-07-29 MED ORDER — INSULIN ASPART 100 UNIT/ML IJ SOLN
INTRAMUSCULAR | Status: AC
  Administered 2022-07-29: 20 [IU] via SUBCUTANEOUS
  Filled 2022-07-29: qty 1

## 2022-07-29 MED ORDER — VASOPRESSIN 20 UNIT/ML IV SOLN
INTRAVENOUS | Status: AC
  Filled 2022-07-29: qty 1

## 2022-07-29 MED ORDER — CEFAZOLIN SODIUM-DEXTROSE 2-4 GM/100ML-% IV SOLN
2.0000 g | INTRAVENOUS | Status: AC
  Administered 2022-07-29: 2 g via INTRAVENOUS
  Filled 2022-07-29: qty 100

## 2022-07-29 MED ORDER — EPHEDRINE SULFATE-NACL 50-0.9 MG/10ML-% IV SOSY
PREFILLED_SYRINGE | INTRAVENOUS | Status: DC | PRN
  Administered 2022-07-29: 5 mg via INTRAVENOUS
  Administered 2022-07-29: 10 mg via INTRAVENOUS

## 2022-07-29 MED ORDER — MIDAZOLAM HCL 2 MG/2ML IJ SOLN
2.0000 mg | Freq: Once | INTRAMUSCULAR | Status: AC
  Administered 2022-07-29: 2 mg via INTRAVENOUS

## 2022-07-29 SURGICAL SUPPLY — 34 items
ADH SKN CLS APL DERMABOND .7 (GAUZE/BANDAGES/DRESSINGS) ×1
ADH SKN CLS LQ APL DERMABOND (GAUZE/BANDAGES/DRESSINGS) ×1
ARMBAND PINK RESTRICT EXTREMIT (MISCELLANEOUS) ×2 IMPLANT
BAG COUNTER SPONGE SURGICOUNT (BAG) ×2 IMPLANT
BAG SPNG CNTER NS LX DISP (BAG) ×1
CANISTER SUCT 3000ML PPV (MISCELLANEOUS) ×2 IMPLANT
CLIP LIGATING EXTRA MED SLVR (CLIP) ×2 IMPLANT
CLIP LIGATING EXTRA SM BLUE (MISCELLANEOUS) ×2 IMPLANT
COVER PROBE W GEL 5X96 (DRAPES) IMPLANT
DERMABOND ADVANCED .7 DNX12 (GAUZE/BANDAGES/DRESSINGS) ×2 IMPLANT
DERMABOND ADVANCED .7 DNX6 (GAUZE/BANDAGES/DRESSINGS) IMPLANT
ELECT REM PT RETURN 9FT ADLT (ELECTROSURGICAL) ×1
ELECTRODE REM PT RTRN 9FT ADLT (ELECTROSURGICAL) ×2 IMPLANT
GLOVE BIO SURGEON STRL SZ7.5 (GLOVE) ×2 IMPLANT
GOWN STRL REUS W/ TWL LRG LVL3 (GOWN DISPOSABLE) ×4 IMPLANT
GOWN STRL REUS W/ TWL XL LVL3 (GOWN DISPOSABLE) ×2 IMPLANT
GOWN STRL REUS W/TWL LRG LVL3 (GOWN DISPOSABLE) ×2
GOWN STRL REUS W/TWL XL LVL3 (GOWN DISPOSABLE) ×1
INSERT FOGARTY SM (MISCELLANEOUS) IMPLANT
KIT BASIN OR (CUSTOM PROCEDURE TRAY) ×2 IMPLANT
KIT TURNOVER KIT B (KITS) ×2 IMPLANT
NS IRRIG 1000ML POUR BTL (IV SOLUTION) ×2 IMPLANT
PACK CV ACCESS (CUSTOM PROCEDURE TRAY) ×2 IMPLANT
PAD ARMBOARD 7.5X6 YLW CONV (MISCELLANEOUS) ×4 IMPLANT
POWDER SURGICEL 3.0 GRAM (HEMOSTASIS) IMPLANT
SLING ARM FOAM STRAP LRG (SOFTGOODS) IMPLANT
SLING ARM FOAM STRAP MED (SOFTGOODS) IMPLANT
SUT MNCRL AB 4-0 PS2 18 (SUTURE) ×2 IMPLANT
SUT PROLENE 6 0 BV (SUTURE) ×2 IMPLANT
SUT VIC AB 3-0 SH 27 (SUTURE) ×1
SUT VIC AB 3-0 SH 27X BRD (SUTURE) ×2 IMPLANT
TOWEL GREEN STERILE (TOWEL DISPOSABLE) ×2 IMPLANT
UNDERPAD 30X36 HEAVY ABSORB (UNDERPADS AND DIAPERS) ×2 IMPLANT
WATER STERILE IRR 1000ML POUR (IV SOLUTION) ×2 IMPLANT

## 2022-07-29 NOTE — H&P (Addendum)
HPI:   [name] is a 51 y.o. female with chronic kidney disease.  She does have a family member that was previously on dialysis but she has never been on dialysis herself.  She is right-hand dominant.  She does not take any blood thinners.  She continues to work in transportation.  No history of upper extremity, chest or breast surgery.       Past Medical History:  Diagnosis Date   Arthritis     Asthma     Cataract      Mixed form OU   CKD (chronic kidney disease)     Coronary artery disease     Diabetes mellitus     Diabetic retinopathy (HCC)      NPDR OU   Hyperlipidemia     Hypertension     Hypertensive retinopathy      OU   Left thyroid nodule      diagnosed 07/2018   PAC (premature atrial contraction) 02/15/2021   Pseudotumor cerebri     Stroke ([place])     Vitamin D deficiency           Family History  Problem Relation Age of Onset   Asthma Mother     Diabetes Father     Hypertension Father     Kidney disease Father     Heart attack Father     Heart failure Father     Hypertension Sister     Asthma Sister     Congestive Heart Failure Maternal Aunt     Diabetes Maternal Grandmother     Congestive Heart Failure Maternal Grandmother     Lung disease Maternal Grandmother     Asthma Other     Hyperlipidemia Other     Hypertension Other     Cancer Other           Past Surgical History:  Procedure Laterality Date   ACHILLES TENDON REPAIR Right     ACHILLES TENDON SURGERY Left 02/19/2021    Procedure: ACHILLES TENDON REPAIR WITH GRAFT;  Surgeon: Felipa Furnace, DPM;  Location: [facility];  Service: Podiatry;  Laterality: Left;  BLOCK   AMPUTATION TOE Right 05/28/2021    Procedure: AMPUTATION TOE;  Surgeon: Lorenda Peck, MD;  Location: [facility];  Service: Podiatry;  Laterality: Right;  Surgical team will do block   GASTROC RECESSION EXTREMITY Left 02/19/2021    Procedure: GASTROC RECESSION EXTREMITY;  Surgeon: Felipa Furnace, DPM;   Location: [facility];  Service: Podiatry;  Laterality: Left;  Block   TUBAL LIGATION          Short Social History:  Social History         Tobacco Use   Smoking status: Former      Packs/day: 0.50      Years: 30.00      Total pack years: 15.00      Types: Cigarettes      Quit date: 2017      Years since quitting: 6.8   Smokeless tobacco: Never  Substance Use Topics   Alcohol use: Not Currently      Alcohol/week: 0.0 standard drinks of alcohol      Comment: rare           Allergies  Allergen Reactions   Bee Pollen Other (See Comments)      Seasonal Allergies   Dust Mite Extract Cough      Sneezing & Cough  Mixed Ragweed Itching            Current Outpatient Medications  Medication Sig Dispense Refill   Accu-Chek FastClix Lancets MISC 4 (four) times daily.       ACCU-CHEK GUIDE test strip 1 each by Other route See admin instructions. for testing 100 each 2   albuterol (VENTOLIN HFA) 108 (90 Base) MCG/ACT inhaler Inhale 1-2 puffs into the lungs every 6 (six) hours as needed for wheezing or shortness of breath.       amLODipine (NORVASC) 10 MG tablet Take 1 tablet (10 mg total) by mouth daily. 90 tablet 1   aspirin 81 MG EC tablet Take 1 tablet (81 mg total) by mouth daily. 90 tablet 2   blood glucose meter kit and supplies KIT Dispense based on patient and insurance preference. Use up to four times daily as directed. (FOR ICD-9 250.00, 250.01). 1 each 0   busPIRone (BUSPAR) 5 MG tablet Take 5 mg by mouth 2 (two) times daily.       cloNIDine (CATAPRES - DOSED IN MG/24 HR) 0.3 mg/24hr patch Place 1 patch (0.3 mg total) onto the skin once a week. (Patient taking differently: Place 0.3 mg onto the skin every Wednesday.) 4 patch 12   Continuous Blood Gluc Receiver (DEXCOM G7 RECEIVER) DEVI Use to monitor BG continuously 1 each 0   Continuous Blood Gluc Sensor (DEXCOM G7 SENSOR) MISC Change sensor every 10 days 3 each 2   EPINEPHrine 0.3 mg/0.3 mL IJ SOAJ  injection Inject 0.3 mg into the muscle as needed for anaphylaxis. 2 each 0   fenofibrate (TRICOR) 48 MG tablet Take 48 mg by mouth daily.       fluticasone (FLONASE) 50 MCG/ACT nasal spray Place 2 sprays into both nostrils daily as needed for allergies or rhinitis.       glipiZIDE (GLUCOTROL XL) 5 MG 24 hr tablet Take 5 mg by mouth daily with breakfast.       hydrOXYzine (VISTARIL) 25 MG capsule Take 25 mg by mouth at bedtime.       insulin aspart protamine - aspart (NOVOLOG 70/30 MIX) (70-30) 100 UNIT/ML FlexPen Inject 20 Units into the skin 2 (two) times daily with a meal.       Insulin Syringes, Disposable, U-202 1 ML MISC 1 application by Does not apply route 2 (two) times a day. 100 each 0   ipratropium-albuterol (DUONEB) 0.5-2.5 (3) MG/3ML SOLN Take 3 mLs by nebulization every 4 (four) hours as needed. 360 mL 0   isosorbide-hydrALAZINE (BIDIL) 20-37.5 MG tablet Take 1 tablet by mouth 3 (three) times daily. 270 tablet 1   labetalol (NORMODYNE) 300 MG tablet Take 300 mg by mouth 2 (two) times daily.       rosuvastatin (CRESTOR) 40 MG tablet Take 40 mg by mouth daily.        No current facility-administered medications for this visit.      Review of Systems  Constitutional:  Constitutional negative. HENT: HENT negative.  Eyes: Eyes negative.  Respiratory: Respiratory negative.  Cardiovascular: Cardiovascular negative.  GI: Gastrointestinal negative.  Musculoskeletal: Musculoskeletal negative.  Skin: Skin negative.  Neurological: Neurological negative. Hematologic: Hematologic/lymphatic negative.  Psychiatric: Psychiatric negative.          Objective:    Vitals:   07/29/22 0552  BP: 136/67  Pulse: 81  Resp: 18  Temp: 97.7 F (36.5 C)  SpO2: 98%      Physical Exam HENT:     Head: Normocephalic.  Nose: Nose normal.  Eyes:     Pupils: Pupils are equal, round, and reactive to light.  Cardiovascular:     Rate and Rhythm: Normal rate.     Pulses: Normal pulses.   Pulmonary:     Effort: Pulmonary effort is normal.  Abdominal:     General: Abdomen is flat.     Palpations: Abdomen is soft.  Musculoskeletal:        General: Normal range of motion.     Cervical back: Normal range of motion and neck supple.     Right lower leg: No edema.     Left lower leg: No edema.  Skin:    General: Skin is warm.     Capillary Refill: Capillary refill takes less than 2 seconds.  Neurological:     General: No focal deficit present.     Mental Status: She is alert.  Psychiatric:        Mood and Affect: Mood normal.        Behavior: Behavior normal.        Thought Content: Thought content normal.        Judgment: Judgment normal.      Data:   Right Cephalic   Diameter (cm)Depth (cm)Findings   +-----------------+-------------+----------+---------+  Shoulder            0.43                          +-----------------+-------------+----------+---------+  Prox upper arm       0.39                          +-----------------+-------------+----------+---------+  Mid upper arm        0.41                          +-----------------+-------------+----------+---------+  Dist upper arm       0.47                          +-----------------+-------------+----------+---------+  Antecubital fossa    0.48               branching  +-----------------+-------------+----------+---------+  Prox forearm         0.31                          +-----------------+-------------+----------+---------+  Mid forearm          0.31                          +-----------------+-------------+----------+---------+  Dist forearm         0.32                          +-----------------+-------------+----------+---------+   +-----------------+-------------+----------+--------+  Right Basilic    Diameter (cm)Depth (cm)Findings  +-----------------+-------------+----------+--------+  Mid upper arm        0.36                          +-----------------+-------------+----------+--------+  Dist upper arm       0.41                         +-----------------+-------------+----------+--------+  Antecubital fossa  0.26                         +-----------------+-------------+----------+--------+   +-----------------+-------------+----------+---------+  Left Cephalic    Diameter (cm)Depth (cm)Findings   +-----------------+-------------+----------+---------+  Shoulder            0.58                          +-----------------+-------------+----------+---------+  Prox upper arm       0.49               branching  +-----------------+-------------+----------+---------+  Mid upper arm        0.48                          +-----------------+-------------+----------+---------+  Dist upper arm       0.64                          +-----------------+-------------+----------+---------+  Antecubital fossa    0.60                          +-----------------+-------------+----------+---------+  Prox forearm         0.30               branching  +-----------------+-------------+----------+---------+  Mid forearm          0.21                          +-----------------+-------------+----------+---------+  Dist forearm         0.30                          +-----------------+-------------+----------+---------+   +-----------------+-------------+----------+--------+  Left Basilic     Diameter (cm)Depth (cm)Findings  +-----------------+-------------+----------+--------+  Mid upper arm        0.39                         +-----------------+-------------+----------+--------+  Dist upper arm       0.38                         +-----------------+-------------+----------+--------+  Antecubital fossa    0.39                         +-----------------+-------------+----------+--------+   Summary: Right: Patent and compressible cephalic and basilic veins.  Left:  Patent and compressible cephalic and basilic veins.  Right Pre-Dialysis Findings:  +-----------------------+----------+--------------------+---------+--------  +  Location              PSV (cm/s)Intralum. Diam. (cm)Waveform  Comments  +-----------------------+----------+--------------------+---------+--------  +  Brachial Antecub. fossa132       0.44                triphasic           +-----------------------+----------+--------------------+---------+--------  +  Radial Art at Wrist    99        0.33                triphasic           +-----------------------+----------+--------------------+---------+--------  +  Ulnar Art at Wrist     74  0.24                triphasic           +-----------------------+----------+--------------------+---------+--------   Left Pre-Dialysis Findings:  +------------------+----------+-------------------+---------+--------------  ----+  Location         PSV (cm/s)Intralum. Diam.    Waveform Comments                                         (cm)                                             +------------------+----------+-------------------+---------+--------------  ----+  Brachial Antecub. 152       0.37               triphasicbrachial  artery     fossa                                                   bifurcates  near                                                             axilla               +------------------+----------+-------------------+---------+--------------  ----+  Radial Art at     117       0.32               triphasic                     Wrist                                                                        +------------------+----------+-------------------+---------+--------------  ----+  Ulnar Art at Wrist69        0.15               triphasic                     +------------------+----------+-------------------+---------+--------------   ----+     Summary:    Right: No obstruction visualized in the right upper extremity.  Left: No obstruction visualized in the left upper extremity.       Assessment/Plan:   51 year-old female advanced chronic kidney disease right-hand-dominant with satisfactory left upper extremity veins by ultrasound today.  Plan will be for left upper extremity fistula versus graft in the today in the OR.        Waynetta Sandy MD [facility]

## 2022-07-29 NOTE — Progress Notes (Signed)
[name] CBG 384 this morning. Dr. Kalman Shan with anesthesia made aware. Verbal order received from Dr. Kalman Shan to give 20 units of Novolog and recheck CBG in 45 minutes.  CBG rechecked in 45 minutes and = 353. Per Dr. Kalman Shan check blood sugar again at 0830.  Blood sugar check at 0830= 341. Verbal order received from Dr. Kalman Shan to give 10 units of insulin aspart (Novolog).

## 2022-07-29 NOTE — Op Note (Addendum)
    Patient name: [name] MRN: [id number] DOB: [birth year] Sex: female  07/29/2022 Pre-operative Diagnosis: ckd 5 Post-operative diagnosis:  Same Surgeon:  Erlene Quan C. Donzetta Matters, MD Assistant: Arlee Muslim, PA Procedure Performed:  Left brachial artery to cephalic vein AV fistula creation  Indications: 51 year old female with chronic kidney disease now indicated for permanent dialysis access.  She appears to have suitable cephalic and basilic vein in her left upper arm which is her nondominant arm and she is indicated for fistula creation.  Findings: The brachial artery had a high bifurcation.  The very large cephalic vein at the antecubital was sewed to the radial artery and at completion there was strong radial artery and ulnar artery signals at the wrist that did augment with compression of the fistula.  I did free of significant soft tissue of the arm to attempt superficialization of the fistula through the same incision.   Procedure:  The patient was identified in the holding area and taken to the operating room where she was placed supine operative table and MAC anesthesia was induced.  A preoperative block been placed.  She was sterilely prepped and draped in left upper extremity usual fashion, antibiotics were minister timeout was called.  Ultrasound was used to identify a very large cephalic vein with branching at the antecubitum and what appeared to be radial and ulnar arteries in the antecubitum with high bifurcation of the brachial artery.  The block was tested noted be intact.  A transverse incision was created we dissected the vein out for several centimeters marked this for orientation.  I dissected through the deep fascia identified what was apparently the radial artery which was sizable placed a vessel loop around this.  The vein was then transected distally and tied off spatulated and flushed with heparinized saline and clamped.  The artery was clamped distally proximally opened  longitudinally flushed with heparinized saline both directions.  The vein was then sewn into side with 6-0 Prolene suture.  Prior completion without flushing all directions.  Upon completion there was a very strong thrill confirmed with Doppler that could be traced up the upper arm and strong radial and ulnar signals at the wrist that did augment with compression of the fistula.  I then freed up significant soft tissue moving up the upper arm to attempt superficialization of this fistula.  The wound was then irrigated and hemostasis obtained and we closed in layers with Vicryl and Monocryl.  Dermabond is placed at the skin level.  She was awakened from anesthesia having tolerated procedure without any complication.  All counts were correct at completion.  An experienced assistant was necessary to facilitate exposure of the vein and artery and assist with anastomosis.  EBL: 20 cc   Blair Lundeen C. Donzetta Matters, MD [facility] Office: [phone] Pager: [phone]

## 2022-07-29 NOTE — Anesthesia Procedure Notes (Signed)
Anesthesia Procedure Image    

## 2022-07-29 NOTE — Discharge Instructions (Signed)
° °  [facility] ° °Discharge Instructions ° °AV Fistula or Graft Surgery for Dialysis Access ° °Please refer to the following instructions for your post-procedure care. Your surgeon or physician assistant will discuss any changes with you. ° °Activity ° °You may drive the day following your surgery, if you are comfortable and no longer taking prescription pain medication. Resume full activity as the soreness in your incision resolves. ° °Bathing/Showering ° °You may shower after you go home. Keep your incision dry for 48 hours. Do not soak in a bathtub, hot tub, or swim until the incision heals completely. You may not shower if you have a hemodialysis catheter. ° °Incision Care ° °Clean your incision with mild soap and water after 48 hours. Pat the area dry with a clean towel. You do not need a bandage unless otherwise instructed. Do not apply any ointments or creams to your incision. You may have skin glue on your incision. Do not peel it off. It will come off on its own in about one week. Your arm may swell a bit after surgery. To reduce swelling use pillows to elevate your arm so it is above your heart. Your doctor will tell you if you need to lightly wrap your arm with an ACE bandage. ° °Diet ° °Resume your normal diet. There are not special food restrictions following this procedure. In order to heal from your surgery, it is CRITICAL to get adequate nutrition. Your body requires vitamins, minerals, and protein. Vegetables are the best source of vitamins and minerals. Vegetables also provide the perfect balance of protein. Processed food has little nutritional value, so try to avoid this. ° °Medications ° °Resume taking all of your medications. If your incision is causing pain, you may take over-the counter pain relievers such as acetaminophen (Tylenol). If you were prescribed a stronger pain medication, please be aware these medications can cause nausea and constipation. Prevent  nausea by taking the medication with a snack or meal. Avoid constipation by drinking plenty of fluids and eating foods with high amount of fiber, such as fruits, vegetables, and grains. Do not take Tylenol if you are taking prescription pain medications. ° ° ° ° °Follow up °Your surgeon may want to see you in the office following your access surgery. If so, this will be arranged at the time of your surgery. ° °Please call us immediately for any of the following conditions: ° °Increased pain, redness, drainage (pus) from your incision site °Fever of 101 degrees or higher °Severe or worsening pain at your incision site °Hand pain or numbness. ° °Reduce your risk of vascular disease: ° °Stop smoking. If you would like help, call [facility] at 1-800-QUIT-NOW (1-800-784-8669) or [place] [facility] at [phone] ° °Manage your cholesterol °Maintain a desired weight °Control your diabetes °Keep your blood pressure down ° °Dialysis ° °It will take several weeks to several months for your new dialysis access to be ready for use. Your surgeon will determine when it is OK to use it. Your nephrologist will continue to direct your dialysis. You can continue to use your Permcath until your new access is ready for use. ° °If you have any questions, please call the office at [phone]. ° °

## 2022-07-29 NOTE — Anesthesia Postprocedure Evaluation (Signed)
Anesthesia Post Note  Patient: [name]  Procedure(s) Performed: LEFT ARTERIOVENOUS (AV) FISTULA CREATION (Left: Arm Upper)     Patient location during evaluation: PACU Anesthesia Type: General Level of consciousness: awake and alert Pain management: pain level controlled Vital Signs Assessment: post-procedure vital signs reviewed and stable Respiratory status: spontaneous breathing, nonlabored ventilation, respiratory function stable and patient connected to nasal cannula oxygen Cardiovascular status: stable and blood pressure returned to baseline Postop Assessment: no apparent nausea or vomiting Anesthetic complications: no  No notable events documented.  Last Vitals:  Vitals:   07/29/22 1145 07/29/22 1200  BP: (!) 109/54 (!) 109/59  Pulse: 80 85  Resp: 18 20  Temp:  36.4 C  SpO2: 95% 94%    Last Pain:  Vitals:   07/29/22 1200  TempSrc:   PainSc: 0-No pain                 Burr Soffer S

## 2022-07-29 NOTE — Transfer of Care (Signed)
Immediate Anesthesia Transfer of Care Note  Patient: [name]  Procedure(s) Performed: LEFT ARTERIOVENOUS (AV) FISTULA CREATION (Left: Arm Upper)  Patient Location: PACU  Anesthesia Type:MAC and Regional  Level of Consciousness: awake and patient cooperative  Airway & Oxygen Therapy: Patient Spontanous Breathing and Patient connected to face mask oxygen  Post-op Assessment: Report given to RN, Post -op Vital signs reviewed and stable, and Patient moving all extremities  Post vital signs: Reviewed and stable  Last Vitals:  Vitals Value Taken Time  BP 134/67 07/29/22 1130  Temp    Pulse 80 07/29/22 1132  Resp 16 07/29/22 1132  SpO2 94 % 07/29/22 1132  Vitals shown include unvalidated device data.  Last Pain:  Vitals:   07/29/22 0928  TempSrc:   PainSc: 0-No pain      Patients Stated Pain Goal: 0 (90/21/11 5520)  Complications: No notable events documented.

## 2022-07-29 NOTE — Inpatient Diabetes Management (Signed)
Inpatient Diabetes Program Recommendations  AACE/ADA: New Consensus Statement on Inpatient Glycemic Control (2015)  Target Ranges:  Prepandial:   less than 140 mg/dL      Peak postprandial:   less than 180 mg/dL (1-2 hours)      Critically ill patients:  140 - 180 mg/dL   Lab Results  Component Value Date   GLUCAP 341 (H) 07/29/2022   HGBA1C 7.9 (H) 04/22/2022    Review of Glycemic Control  Latest Reference Range & Units 07/29/22 06:58 07/29/22 07:40 07/29/22 08:33  Glucose-Capillary 70 - 99 mg/dL 379 (H) 353 (H) 341 (H)  (H): Data is abnormally high  Diabetes history: DM2 Outpatient Diabetes medications: 70/30 20 units BID, Glipizide 10 mg QAM, Dexcom Current orders for Inpatient glycemic control: none  Inpatient Diabetes Program Recommendations:    Novolog 0-15 units Q4H.  Will continue to follow while inpatient.  Thank you, Reche Dixon, MSN, [place] Diabetes Coordinator Inpatient Diabetes Program [phone] (team pager from 8a-5p)

## 2022-07-29 NOTE — Anesthesia Procedure Notes (Addendum)
Anesthesia Regional Block: Supraclavicular block   Pre-Anesthetic Checklist: , timeout performed,  Correct Patient, Correct Site, Correct Laterality,  Correct Procedure, Correct Position, site marked,  Risks and benefits discussed,  Surgical consent,  Pre-op evaluation,  At surgeon's request and post-op pain management  Laterality: Left  Prep: chloraprep       Needles:  Injection technique: Single-shot  Needle Type: Echogenic Needle     Needle Length: 9cm      Additional Needles:   Procedures:,,,, ultrasound used (permanent image in chart),,    Narrative:  Start time: 07/29/2022 9:04 AM End time: 07/29/2022 9:13 AM Injection made incrementally with aspirations every 5 mL.  Performed by: Personally  Anesthesiologist: Myrtie Soman, MD  Additional Notes: Patient tolerated the procedure well without complications

## 2022-07-30 ENCOUNTER — Encounter (HOSPITAL_COMMUNITY): Payer: Self-pay | Admitting: Vascular Surgery

## 2022-08-03 ENCOUNTER — Ambulatory Visit (HOSPITAL_COMMUNITY)
Admission: RE | Admit: 2022-08-03 | Discharge: 2022-08-03 | Disposition: A | Discharge status: Home / Self Care | Payer: Medicaid Other | Source: Ambulatory Visit | Attending: Nephrology | Admitting: Nephrology

## 2022-08-03 VITALS — BP 180/101 | HR 82 | Temp 97.5°F | Resp 18

## 2022-08-03 DIAGNOSIS — N179 Acute kidney failure, unspecified: Secondary | ICD-10-CM | POA: Insufficient documentation

## 2022-08-03 DIAGNOSIS — N184 Chronic kidney disease, stage 4 (severe): Secondary | ICD-10-CM | POA: Diagnosis present

## 2022-08-03 DIAGNOSIS — D631 Anemia in chronic kidney disease: Secondary | ICD-10-CM | POA: Diagnosis not present

## 2022-08-03 LAB — FERRITIN: Ferritin: 272 ng/mL (ref 11–307)

## 2022-08-03 LAB — IRON AND TIBC
Iron: 78 ug/dL (ref 28–170)
Saturation Ratios: 22 % (ref 10.4–31.8)
TIBC: 354 ug/dL (ref 250–450)
UIBC: 276 ug/dL

## 2022-08-03 LAB — POCT HEMOGLOBIN-HEMACUE: Hemoglobin: 10.3 g/dL — ABNORMAL LOW (ref 12.0–15.0)

## 2022-08-03 MED ORDER — EPOETIN ALFA-EPBX 10000 UNIT/ML IJ SOLN
INTRAMUSCULAR | Status: AC
  Filled 2022-08-03: qty 1

## 2022-08-03 MED ORDER — CLONIDINE HCL 0.1 MG PO TABS
ORAL_TABLET | ORAL | Status: AC
  Filled 2022-08-03: qty 1

## 2022-08-03 MED ORDER — EPOETIN ALFA-EPBX 10000 UNIT/ML IJ SOLN
10000.0000 [IU] | INTRAMUSCULAR | Status: DC
  Administered 2022-08-03: 10000 [IU] via SUBCUTANEOUS

## 2022-08-03 MED ORDER — CLONIDINE HCL 0.1 MG PO TABS
0.1000 mg | ORAL_TABLET | Freq: Once | ORAL | Status: AC | PRN
  Administered 2022-08-03: 0.1 mg via ORAL

## 2022-08-24 ENCOUNTER — Other Ambulatory Visit: Payer: Self-pay | Admitting: *Deleted

## 2022-08-24 DIAGNOSIS — N186 End stage renal disease: Secondary | ICD-10-CM

## 2022-08-31 ENCOUNTER — Ambulatory Visit (HOSPITAL_COMMUNITY)
Admission: RE | Admit: 2022-08-31 | Discharge: 2022-08-31 | Disposition: A | Discharge status: Home / Self Care | Payer: Medicaid Other | Source: Ambulatory Visit | Attending: Nephrology | Admitting: Nephrology

## 2022-08-31 VITALS — BP 132/67 | HR 93 | Temp 97.0°F | Resp 17

## 2022-08-31 DIAGNOSIS — N179 Acute kidney failure, unspecified: Secondary | ICD-10-CM | POA: Diagnosis present

## 2022-08-31 DIAGNOSIS — N184 Chronic kidney disease, stage 4 (severe): Secondary | ICD-10-CM | POA: Insufficient documentation

## 2022-08-31 DIAGNOSIS — D631 Anemia in chronic kidney disease: Secondary | ICD-10-CM | POA: Diagnosis not present

## 2022-08-31 LAB — IRON AND TIBC
Iron: 80 ug/dL (ref 28–170)
Saturation Ratios: 29 % (ref 10.4–31.8)
TIBC: 280 ug/dL (ref 250–450)
UIBC: 200 ug/dL

## 2022-08-31 LAB — FERRITIN: Ferritin: 364 ng/mL — ABNORMAL HIGH (ref 11–307)

## 2022-08-31 LAB — POCT HEMOGLOBIN-HEMACUE: Hemoglobin: 10.8 g/dL — ABNORMAL LOW (ref 12.0–15.0)

## 2022-08-31 MED ORDER — EPOETIN ALFA-EPBX 10000 UNIT/ML IJ SOLN
10000.0000 [IU] | INTRAMUSCULAR | Status: DC
  Administered 2022-08-31: 10000 [IU] via SUBCUTANEOUS

## 2022-08-31 MED ORDER — EPOETIN ALFA-EPBX 10000 UNIT/ML IJ SOLN
INTRAMUSCULAR | Status: AC
  Filled 2022-08-31: qty 1

## 2022-09-07 ENCOUNTER — Ambulatory Visit (HOSPITAL_COMMUNITY): Payer: Medicaid Other

## 2022-09-21 ENCOUNTER — Ambulatory Visit (INDEPENDENT_AMBULATORY_CARE_PROVIDER_SITE_OTHER): Payer: Medicaid Other | Admitting: Physician Assistant

## 2022-09-21 ENCOUNTER — Ambulatory Visit (HOSPITAL_COMMUNITY)
Admission: RE | Admit: 2022-09-21 | Discharge: 2022-09-21 | Disposition: A | Discharge status: Home / Self Care | Payer: Medicaid Other | Source: Ambulatory Visit | Attending: Physician Assistant | Admitting: Physician Assistant

## 2022-09-21 VITALS — BP 131/71 | HR 82 | Temp 97.9°F | Ht 69.0 in | Wt 243.0 lb

## 2022-09-21 DIAGNOSIS — N186 End stage renal disease: Secondary | ICD-10-CM | POA: Diagnosis present

## 2022-09-21 DIAGNOSIS — N185 Chronic kidney disease, stage 5: Secondary | ICD-10-CM

## 2022-09-21 NOTE — Progress Notes (Signed)
POST OPERATIVE OFFICE NOTE    CC:  F/u for surgery  HPI:  This is a 52 y.o. female who is s/p left arm radiocephalic fistula in the upper arm by Dr. Donzetta Matters on 07/29/2022.  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  She believes her incision is well-healed.  She denies any steal symptoms in the left hand.  She has a follow-up appointment with her nephrologist Dr. Moshe Cipro next week.  She is an insulin-dependent diabetic.  She denies fevers, chills, nausea/vomiting.  Allergies  Allergen Reactions   Bee Pollen Other (See Comments)    Seasonal Allergies   Dust Mite Extract Cough    Sneezing & Cough   Mixed Ragweed Itching    Current Outpatient Medications  Medication Sig Dispense Refill   Accu-Chek FastClix Lancets MISC 1 each by Other route 4 (four) times daily. 100 each 2   ACCU-CHEK GUIDE test strip 1 each by Other route 4 (four) times daily. for testing 100 each 2   albuterol (VENTOLIN HFA) 108 (90 Base) MCG/ACT inhaler Inhale 1-2 puffs into the lungs every 6 (six) hours as needed for wheezing or shortness of breath.     amLODipine (NORVASC) 10 MG tablet Take 1 tablet (10 mg total) by mouth daily. 90 tablet 1   aspirin 81 MG EC tablet Take 1 tablet (81 mg total) by mouth daily. 90 tablet 2   blood glucose meter kit and supplies KIT Dispense based on patient and insurance preference. Use up to four times daily as directed. (FOR ICD-9 250.00, 250.01). 1 each 0   Blood Glucose Monitoring Suppl (ACCU-CHEK GUIDE ME) w/Device KIT Use to check blood glucose four times daily 1 kit 0   cloNIDine (CATAPRES - DOSED IN MG/24 HR) 0.3 mg/24hr patch Place 1 patch (0.3 mg total) onto the skin once a week. (Patient taking differently: Place 0.3 mg onto the skin every Wednesday.) 4 patch 12   Continuous Blood Gluc Receiver (DEXCOM G7 RECEIVER) DEVI Use to monitor BG continuously 1 each 0   Continuous Blood Gluc Sensor (DEXCOM G7 SENSOR) MISC Change sensor every 10 days 3 each 2   EPINEPHrine  0.3 mg/0.3 mL IJ SOAJ injection Inject 0.3 mg into the muscle as needed for anaphylaxis. 2 each 0   fenofibrate (TRICOR) 145 MG tablet Take 145 mg by mouth in the morning.     fluticasone (FLONASE) 50 MCG/ACT nasal spray Place 2 sprays into both nostrils daily as needed for allergies or rhinitis.     furosemide (LASIX) 40 MG tablet Take 40 mg by mouth in the morning.     glipiZIDE (GLUCOTROL) 10 MG tablet Take 10 mg by mouth daily before breakfast.     hydrOXYzine (VISTARIL) 25 MG capsule Take 25 mg by mouth at bedtime.     insulin aspart protamine - aspart (NOVOLOG 70/30 MIX) (70-30) 100 UNIT/ML FlexPen Inject 20 Units into the skin 2 (two) times daily with a meal.     Insulin Syringes, Disposable, 0000000 1 ML MISC 1 application by Does not apply route 2 (two) times a day. 100 each 0   ipratropium-albuterol (DUONEB) 0.5-2.5 (3) MG/3ML SOLN Take 3 mLs by nebulization every 4 (four) hours as needed. 360 mL 0   isosorbide-hydrALAZINE (BIDIL) 20-37.5 MG tablet Take 1 tablet by mouth 3 (three) times daily. (Patient taking differently: Take 2 tablets by mouth 3 (three) times daily.) 270 tablet 1   labetalol (NORMODYNE) 300 MG tablet Take 300 mg by mouth 2 (two)  times daily.     rosuvastatin (CRESTOR) 40 MG tablet Take 40 mg by mouth in the morning.     No current facility-administered medications for this visit.     ROS:  See HPI  Physical Exam:  Vitals:   09/21/22 0904  BP: 131/71  Pulse: 82  Temp: 97.9 F (36.6 C)  TempSrc: Temporal  SpO2: 95%  Weight: 243 lb (110.2 kg)  Height: 5' 9"$  (1.753 m)    Incision: Left arm incision well-healed Extremities: 2+ palpable left radial pulse; no thrill or bruit in fistula Neuro: A&O  Assessment/Plan:  This is a 52 y.o. female who is s/p: Left radial to cephalic vein fistula in the [facility] fossa due to a high bifurcation of the brachial artery  -I am unable to feel flow through the cephalic vein in the upper arm.  Duplex also demonstrates an occluded  cephalic vein.  Patient is not yet on dialysis however referral records prior to surgery have Dr. Moshe Cipro requesting fistula or graft placement as soon as possible.  Thus we will proceed with left upper arm loop graft due to the high bifurcation of the brachial artery.  This was described to the patient in detail and all questions were answered.  We will schedule this for the near future.     Dagoberto Ligas, PA-C [facility] [phone]  Clinic MD:  Donzetta Matters

## 2022-09-27 NOTE — Progress Notes (Unsigned)
HPI 52 year old female smoker followed for OSA, complicated by arthritis, DM 2, HBP, pseudotumor cerebri NPSG 07/03/17- AHI 10.1/ hr, desaturation to 89%, body weight 212 lbs, + PLMA   ------------------------------------------------------------------------------------------------------  09/28/21- 52 year old female Smoker followed for OSA, complicated by arthritis, DM 2, HTN, pseudotumor cerebri, CVA, CHF, CAD, NSVT, CKD, Obesity, Anemia,  CPAP auto 10-20/[employer] begun 08/25/2017.   AirSense 10 AutoSet Download- compliance 67%, AHI 2.9/ hr Body weight today-236 lbs Covid vax-no Flu vax-no Followed by Nephrology and Cardiology for HTN -----Patient would like a different mask. Sleeping ok.  Arrival BP 160/100-discussed.  She has an upcoming appointment to follow-up on this. She indicates that ill fitting mask impacts her compliance with CPAP use and we discussed this.  She can contact her DME company and we can refer for mask fitting at sleep center.  Download reviewed.  Pressure range is appropriate. She denies breathing problems except that she uses rescue inhaler about once every week or 2 for mild wheeze. CXR 07/21/21- IMPRESSION: Cardiomegaly with development of mild congestive heart failure.  09/29/22- [name]  Smoker followed for OSA, complicated by arthritis, DM 2, HTN, pseudotumor cerebri, CVA, CHF, CAD, NSVT, CKD/ ESRD, Obesity, Anemia,  CPAP auto 10-20/[employer] begun 08/25/2017.   AirSense 10 AutoSet Download- compliance  Body weight today- Covid vax-no Flu vax-no Followed by Nephrology and Cardiology for HTN                  today 142/ 98 Fistula placed L upper arm Will need to take her CPAP to DME to get AirView/  Card --------Pt is doing well. No complaints with cpap machine  She sleeps better with CPAP, reporting at least 4 hours and disturbs sleep as long as she uses it. Machine is working well.  She needs to take it into the [facility] to get download capability installed,  apparently Airview has expired. She has failing renal function.  A fistula placed in her left arm apparently does not have adequate flow so she is pending placement of a graft but does not know when she might start hemodialysis. Breathing she says is fine.   ROS-see HPI   + = positive Constitutional:    weight loss, night sweats, fevers, chills,+ fatigue, lassitude. HEENT:    headaches, difficulty swallowing, tooth/dental problems, sore throat,       sneezing, itching, ear ache, nasal congestion, post nasal drip, snoring CV:    chest pain, orthopnea, PND, swelling in lower extremities, anasarca,                                                  dizziness, palpitations Resp:   +shortness of breath with exertion or at rest.                +productive cough,   non-productive cough, coughing up of blood.              change in color of mucus.  wheezing.   Skin:    rash or lesions. GI:  No-   heartburn, indigestion, abdominal pain, nausea, vomiting, diarrhea,                 change in bowel habits, loss of appetite GU: dysuria, change in color of urine, no urgency or frequency.   flank pain. MS:   joint pain, stiffness, decreased range of  motion, back pain. Neuro-     nothing unusual Psych:  change in mood or affect.  depression or anxiety.   memory loss.  OBJ- Physical Exam   BP 160/100 on arrival General- Alert, Oriented, Affect-appropriate, Distress- none acute, + obese Skin- rash-none, lesions- none, excoriation- none  Lymphadenopathy- none Head- atraumatic            Eyes- Gross vision intact, PERRLA, conjunctivae and secretions clear            Ears- Hearing, canals-normal            Nose- Clear, no-Septal dev, mucus, polyps, erosion, perforation             Throat- Mallampati III , mucosa clear , drainage- none, tonsils- atrophic,  + tongue stud Neck- flexible , trachea midline, no stridor , thyroid nl, carotid no bruit Chest - symmetrical excursion , unlabored           Heart/CV-  RRR , no murmur , no gallop  , no rub, nl s1 s2                           - JVD- none , edema- none, stasis changes- none, varices- none           Lung- clear to P&A, wheeze- none, cough- none , dullness-none, rub- none           Chest wall-  Abd-  Br/ Gen/ Rectal- Not done, not indicated Extrem- +cane Neuro- grossly intact to observation

## 2022-09-28 ENCOUNTER — Ambulatory Visit (HOSPITAL_COMMUNITY)
Admission: RE | Admit: 2022-09-28 | Discharge: 2022-09-28 | Disposition: A | Discharge status: Home / Self Care | Payer: Medicaid Other | Source: Ambulatory Visit | Attending: Nephrology | Admitting: Nephrology

## 2022-09-28 VITALS — BP 121/57 | HR 83 | Temp 98.1°F | Resp 17

## 2022-09-28 DIAGNOSIS — N185 Chronic kidney disease, stage 5: Secondary | ICD-10-CM | POA: Diagnosis not present

## 2022-09-28 DIAGNOSIS — D631 Anemia in chronic kidney disease: Secondary | ICD-10-CM | POA: Insufficient documentation

## 2022-09-28 DIAGNOSIS — N179 Acute kidney failure, unspecified: Secondary | ICD-10-CM

## 2022-09-28 DIAGNOSIS — N184 Chronic kidney disease, stage 4 (severe): Secondary | ICD-10-CM | POA: Diagnosis present

## 2022-09-28 LAB — IRON AND TIBC
Iron: 62 ug/dL (ref 28–170)
Saturation Ratios: 21 % (ref 10.4–31.8)
TIBC: 298 ug/dL (ref 250–450)
UIBC: 236 ug/dL

## 2022-09-28 LAB — POCT HEMOGLOBIN-HEMACUE: Hemoglobin: 9.7 g/dL — ABNORMAL LOW (ref 12.0–15.0)

## 2022-09-28 LAB — FERRITIN: Ferritin: 290 ng/mL (ref 11–307)

## 2022-09-28 MED ORDER — EPOETIN ALFA-EPBX 10000 UNIT/ML IJ SOLN
INTRAMUSCULAR | Status: AC
  Filled 2022-09-28: qty 1

## 2022-09-28 MED ORDER — EPOETIN ALFA-EPBX 10000 UNIT/ML IJ SOLN
10000.0000 [IU] | INTRAMUSCULAR | Status: DC
  Administered 2022-09-28: 10000 [IU] via SUBCUTANEOUS

## 2022-09-29 ENCOUNTER — Encounter: Payer: Self-pay | Admitting: Internal Medicine

## 2022-09-29 ENCOUNTER — Ambulatory Visit: Payer: Medicaid Other | Admitting: Internal Medicine

## 2022-09-29 VITALS — BP 142/98 | HR 90 | Ht 69.0 in | Wt 243.6 lb

## 2022-09-29 DIAGNOSIS — G4733 Obstructive sleep apnea (adult) (pediatric): Secondary | ICD-10-CM | POA: Diagnosis not present

## 2022-09-29 DIAGNOSIS — N1832 Chronic kidney disease, stage 3b: Secondary | ICD-10-CM | POA: Diagnosis not present

## 2022-09-29 NOTE — Patient Instructions (Signed)
Please call [employer] and make an appointment to take your machine in so they can put in software or an SD card. We need that so we can be sure your machine is till set right for you.  We can continue CPAP auto 10-20

## 2022-09-29 NOTE — Assessment & Plan Note (Signed)
Progressive renal dysfunction being managed by nephrology.  Hemodialysis access being placed.

## 2022-09-29 NOTE — Assessment & Plan Note (Signed)
CPAP and reports good compliance and control. Plan-she is instructed to contact [employer] so she can bring her machine in to get download capability reinstalled.  Continuing auto 10-20

## 2022-10-26 ENCOUNTER — Other Ambulatory Visit: Payer: Self-pay | Admitting: Physician Assistant

## 2022-10-26 ENCOUNTER — Ambulatory Visit (HOSPITAL_COMMUNITY)
Admission: RE | Admit: 2022-10-26 | Discharge: 2022-10-26 | Disposition: A | Discharge status: Home / Self Care | Payer: Medicaid Other | Source: Ambulatory Visit | Attending: Nephrology | Admitting: Nephrology

## 2022-10-26 VITALS — BP 178/85 | HR 75 | Temp 97.0°F | Resp 17

## 2022-10-26 DIAGNOSIS — Z1231 Encounter for screening mammogram for malignant neoplasm of breast: Secondary | ICD-10-CM

## 2022-10-26 DIAGNOSIS — D631 Anemia in chronic kidney disease: Secondary | ICD-10-CM | POA: Insufficient documentation

## 2022-10-26 DIAGNOSIS — N185 Chronic kidney disease, stage 5: Secondary | ICD-10-CM | POA: Insufficient documentation

## 2022-10-26 DIAGNOSIS — N179 Acute kidney failure, unspecified: Secondary | ICD-10-CM | POA: Diagnosis not present

## 2022-10-26 DIAGNOSIS — N184 Chronic kidney disease, stage 4 (severe): Secondary | ICD-10-CM | POA: Diagnosis present

## 2022-10-26 LAB — POCT HEMOGLOBIN-HEMACUE: Hemoglobin: 10.9 g/dL — ABNORMAL LOW (ref 12.0–15.0)

## 2022-10-26 LAB — RENAL FUNCTION PANEL
Albumin: 3.4 g/dL — ABNORMAL LOW (ref 3.5–5.0)
Anion gap: 13 (ref 5–15)
BUN: 37 mg/dL — ABNORMAL HIGH (ref 6–20)
CO2: 23 mmol/L (ref 22–32)
Calcium: 8.5 mg/dL — ABNORMAL LOW (ref 8.9–10.3)
Chloride: 99 mmol/L (ref 98–111)
Creatinine, Ser: 3.62 mg/dL — ABNORMAL HIGH (ref 0.44–1.00)
GFR, Estimated: 15 mL/min — ABNORMAL LOW (ref >60)
Glucose, Bld: 362 mg/dL — ABNORMAL HIGH (ref 70–99)
Phosphorus: 4 mg/dL (ref 2.5–4.6)
Potassium: 3.3 mmol/L — ABNORMAL LOW (ref 3.5–5.1)
Sodium: 135 mmol/L (ref 135–145)

## 2022-10-26 LAB — IRON AND TIBC
Iron: 61 ug/dL (ref 28–170)
Saturation Ratios: 17 % (ref 10.4–31.8)
TIBC: 365 ug/dL (ref 250–450)
UIBC: 304 ug/dL

## 2022-10-26 LAB — FERRITIN: Ferritin: 277 ng/mL (ref 11–307)

## 2022-10-26 MED ORDER — EPOETIN ALFA-EPBX 10000 UNIT/ML IJ SOLN
INTRAMUSCULAR | Status: AC
  Filled 2022-10-26: qty 1

## 2022-10-26 MED ORDER — EPOETIN ALFA-EPBX 10000 UNIT/ML IJ SOLN
10000.0000 [IU] | INTRAMUSCULAR | Status: DC
  Administered 2022-10-26: 10000 [IU] via SUBCUTANEOUS

## 2022-10-27 LAB — PTH, INTACT AND CALCIUM
Calcium, Total (PTH): 8.6 mg/dL — ABNORMAL LOW (ref 8.7–10.2)
PTH: 322 pg/mL — ABNORMAL HIGH (ref 15–65)

## 2022-11-22 ENCOUNTER — Telehealth: Payer: Self-pay | Admitting: "Endocrinology

## 2022-11-22 NOTE — Telephone Encounter (Signed)
Pt is asking for an appt. Do you want any labs prior?

## 2022-11-23 ENCOUNTER — Encounter (HOSPITAL_COMMUNITY): Payer: Medicaid Other

## 2022-11-24 ENCOUNTER — Ambulatory Visit: Payer: Medicaid Other | Admitting: "Endocrinology

## 2022-11-24 ENCOUNTER — Encounter: Payer: Self-pay | Admitting: "Endocrinology

## 2022-11-24 VITALS — BP 146/82 | HR 72 | Ht 69.0 in | Wt 246.0 lb

## 2022-11-24 DIAGNOSIS — N184 Chronic kidney disease, stage 4 (severe): Secondary | ICD-10-CM

## 2022-11-24 DIAGNOSIS — E782 Mixed hyperlipidemia: Secondary | ICD-10-CM | POA: Diagnosis not present

## 2022-11-24 DIAGNOSIS — I10 Essential (primary) hypertension: Secondary | ICD-10-CM

## 2022-11-24 DIAGNOSIS — Z794 Long term (current) use of insulin: Secondary | ICD-10-CM | POA: Diagnosis not present

## 2022-11-24 DIAGNOSIS — Z6835 Body mass index (BMI) 35.0-35.9, adult: Secondary | ICD-10-CM

## 2022-11-24 DIAGNOSIS — E1122 Type 2 diabetes mellitus with diabetic chronic kidney disease: Secondary | ICD-10-CM

## 2022-11-24 LAB — POCT GLYCOSYLATED HEMOGLOBIN (HGB A1C): HbA1c, POC (controlled diabetic range): 14.5 % — AB (ref 0.0–7.0)

## 2022-11-24 MED ORDER — INSULIN ASPART PROT & ASPART (70-30 MIX) 100 UNIT/ML PEN: 30.0000 [IU] | PEN_INJECTOR | Freq: Two times a day (BID) | SUBCUTANEOUS | 1 refills | Status: DC

## 2022-11-24 NOTE — Patient Instructions (Signed)

## 2022-11-24 NOTE — Progress Notes (Signed)
11/24/2022, 4:38 PM  Endocrinology follow-up note   Subjective:    Patient ID: [name], female    DOB: Sep 26, 1970.  [name] is being seen in follow-up after she was seen in consultation for management of currently uncontrolled symptomatic diabetes requested by  Jackie Plum, MD.  She missed her appointment since September 2023.  Past Medical History:  Diagnosis Date   Anemia    Anxiety    Arthritis    Asthma    Cataract    Mixed form OU   CKD (chronic kidney disease)    Coronary artery disease    Depression    Diabetes mellitus    Diabetic retinopathy    NPDR OU   Hyperlipidemia    Hypertension    Hypertensive retinopathy    OU   Left thyroid nodule    diagnosed 07/2018   PAC (premature atrial contraction) 02/15/2021   Pseudotumor cerebri    Sleep apnea    Uses a cpap   Stroke    Vitamin D deficiency     Past Surgical History:  Procedure Laterality Date   ACHILLES TENDON REPAIR Right    ACHILLES TENDON SURGERY Left 02/19/2021   Procedure: ACHILLES TENDON REPAIR WITH GRAFT;  Surgeon: Candelaria Stagers, DPM;  Location: [facility];  Service: Podiatry;  Laterality: Left;  BLOCK   AMPUTATION TOE Right 05/28/2021   Procedure: AMPUTATION TOE;  Surgeon: Louann Sjogren, MD;  Location: [facility] OR;  Service: Podiatry;  Laterality: Right;  Surgical team will do block   AV FISTULA PLACEMENT Left 07/29/2022   Procedure: LEFT ARTERIOVENOUS (AV) FISTULA CREATION;  Surgeon: Maeola Harman, MD;  Location: [facility] OR;  Service: Vascular;  Laterality: Left;   GASTROC RECESSION EXTREMITY Left 02/19/2021   Procedure: GASTROC RECESSION EXTREMITY;  Surgeon: Candelaria Stagers, DPM;  Location: [facility];  Service: Podiatry;  Laterality: Left;  Block   TUBAL LIGATION      Social History   Socioeconomic History   Marital status: Single    Spouse name: Not on file   Number of children: 4   Years of education:  Not on file   Highest education level: Not on file  Occupational History   Occupation: unemployed  Tobacco Use   Smoking status: Former    Packs/day: 0.50    Years: 30.00    Additional pack years: 0.00    Total pack years: 15.00    Types: Cigarettes    Quit date: 2017    Years since quitting: 7.2   Smokeless tobacco: Never  Vaping Use   Vaping Use: Never used  Substance and Sexual Activity   Alcohol use: Not Currently    Alcohol/week: 0.0 standard drinks of alcohol    Comment: rare   Drug use: Yes    Types: Marijuana    Comment: occasional   Sexual activity: Not Currently    Partners: Male    Birth control/protection: Surgical  Other Topics Concern   Not on file  Social History Narrative   Lives with 3 kids   Right handed   Drinks 2 cups caffeine daily   Social Determinants of Health   Financial Resource Strain: Not on file  Food Insecurity: Patient Declined (04/20/2022)   Hunger Vital Sign    Worried About Running Out of Food in the Last Year: Patient declined    Ran Out of Food in the Last Year: Patient declined  Transportation Needs:  Patient Declined (04/20/2022)   PRAPARE - Transportation    Lack of Transportation (Medical): Patient declined    Lack of Transportation (Non-Medical): Patient declined  Physical Activity: Inactive (02/15/2021)   Exercise Vital Sign    Days of Exercise per Week: 0 days    Minutes of Exercise per Session: 0 min  Stress: Stress Concern Present (02/15/2021)   [employer] of Occupational Health - Occupational Stress Questionnaire    Feeling of Stress : Very much  Social Connections: Moderately Isolated (02/15/2021)   Social Connection and Isolation Panel [NHANES]    Frequency of Communication with Friends and Family: Three times a week    Frequency of Social Gatherings with Friends and Family: Three times a week    Attends Religious Services: More than 4 times per year    Active Member of Clubs or Organizations: No    Attends Occupational hygienist Meetings: Never    Marital Status: Never married    Family History  Problem Relation Age of Onset   Asthma Mother    Diabetes Father    Hypertension Father    Kidney disease Father    Heart attack Father    Heart failure Father    Hypertension Sister    Asthma Sister    Congestive Heart Failure Maternal Aunt    Diabetes Maternal Grandmother    Congestive Heart Failure Maternal Grandmother    Lung disease Maternal Grandmother    Asthma Other    Hyperlipidemia Other    Hypertension Other    Cancer Other     Outpatient Encounter Medications as of 11/24/2022  Medication Sig   calcitRIOL (ROCALTROL) 0.25 MCG capsule Take 0.25 mcg by mouth daily.   [DISCONTINUED] NOVOLOG FLEXPEN 100 UNIT/ML FlexPen Inject 10 Units into the skin 3 (three) times daily with meals.   Accu-Chek FastClix Lancets MISC 1 each by Other route 4 (four) times daily.   ACCU-CHEK GUIDE test strip 1 each by Other route 4 (four) times daily. for testing   albuterol (VENTOLIN HFA) 108 (90 Base) MCG/ACT inhaler Inhale 1-2 puffs into the lungs every 6 (six) hours as needed for wheezing or shortness of breath.   amLODipine (NORVASC) 10 MG tablet Take 1 tablet (10 mg total) by mouth daily.   aspirin 81 MG EC tablet Take 1 tablet (81 mg total) by mouth daily.   blood glucose meter kit and supplies KIT Dispense based on patient and insurance preference. Use up to four times daily as directed. (FOR ICD-9 250.00, 250.01).   Blood Glucose Monitoring Suppl (ACCU-CHEK GUIDE ME) w/Device KIT Use to check blood glucose four times daily   cloNIDine (CATAPRES - DOSED IN MG/24 HR) 0.3 mg/24hr patch Place 1 patch (0.3 mg total) onto the skin once a week. (Patient taking differently: Place 0.3 mg onto the skin every Wednesday.)   Continuous Blood Gluc Receiver (DEXCOM G7 RECEIVER) DEVI Use to monitor BG continuously   Continuous Blood Gluc Sensor (DEXCOM G7 SENSOR) MISC Change sensor every 10 days   EPINEPHrine 0.3  mg/0.3 mL IJ SOAJ injection Inject 0.3 mg into the muscle as needed for anaphylaxis.   fenofibrate (TRICOR) 145 MG tablet Take 145 mg by mouth in the morning.   fluticasone (FLONASE) 50 MCG/ACT nasal spray Place 2 sprays into both nostrils daily as needed for allergies or rhinitis.   furosemide (LASIX) 40 MG tablet Take 40 mg by mouth in the morning.   glipiZIDE (GLUCOTROL) 10 MG tablet Take 10 mg by mouth daily  before breakfast.   hydrOXYzine (VISTARIL) 25 MG capsule Take 25 mg by mouth at bedtime.   insulin aspart protamine - aspart (NOVOLOG 70/30 MIX) (70-30) 100 UNIT/ML FlexPen Inject 30 Units into the skin 2 (two) times daily with a meal.   Insulin Syringes, Disposable, U-100 1 ML MISC 1 application by Does not apply route 2 (two) times a day.   ipratropium-albuterol (DUONEB) 0.5-2.5 (3) MG/3ML SOLN Take 3 mLs by nebulization every 4 (four) hours as needed.   isosorbide-hydrALAZINE (BIDIL) 20-37.5 MG tablet Take 1 tablet by mouth 3 (three) times daily. (Patient taking differently: Take 2 tablets by mouth 3 (three) times daily.)   labetalol (NORMODYNE) 300 MG tablet Take 300 mg by mouth 2 (two) times daily.   rosuvastatin (CRESTOR) 40 MG tablet Take 40 mg by mouth in the morning.   [DISCONTINUED] insulin aspart protamine - aspart (NOVOLOG 70/30 MIX) (70-30) 100 UNIT/ML FlexPen Inject 30 Units into the skin 2 (two) times daily with a meal.   No facility-administered encounter medications on file as of 11/24/2022.    ALLERGIES: Allergies  Allergen Reactions   Bee Pollen Other (See Comments)    Seasonal Allergies   Dust Mite Extract Cough    Sneezing & Cough   Mixed Ragweed Itching    VACCINATION STATUS: Immunization History  Administered Date(s) Administered   Influenza Split 04/23/2013    Diabetes She presents for her follow-up diabetic visit. She has type 2 diabetes mellitus. Onset time: She was diagnosed at approximate age of 31 years. Her disease course has been worsening.  There are no hypoglycemic associated symptoms. Pertinent negatives for hypoglycemia include no confusion, headaches, pallor or seizures. Associated symptoms include polydipsia and polyuria. Pertinent negatives for diabetes include no blurred vision, no chest pain, no fatigue and no polyphagia. There are no hypoglycemic complications. Symptoms are worsening. Diabetic complications include a CVA, heart disease, nephropathy and retinopathy. Risk factors for coronary artery disease include diabetes mellitus, dyslipidemia, family history, hypertension, obesity, post-menopausal, sedentary lifestyle and tobacco exposure. Current diabetic treatment includes insulin injections and oral agent (monotherapy). Her weight is fluctuating minimally. She is following a generally unhealthy diet. When asked about meal planning, she reported none. She has had a previous visit with a dietitian. She never participates in exercise. Her home blood glucose trend is increasing steadily. Her breakfast blood glucose range is generally >200 mg/dl. Her bedtime blood glucose range is generally >200 mg/dl. Her overall blood glucose range is >200 mg/dl. (She missed her appointment since September 2023.  She returns with a meter showing average blood glucose of 354 for the last 14 days out of 20 readings.  For unclear reasons, she is on plain NovoLog 10 units 3 times daily instead of her NovoLog 70/30 given to her during her last visit.   -Her point-of-care A1c is 14.5% today.   ) An ACE inhibitor/angiotensin II receptor blocker is being taken. Eye exam is current.  Hypertension This is a chronic problem. The current episode started more than 1 year ago. The problem is uncontrolled. Pertinent negatives include no blurred vision, chest pain, headaches, palpitations or shortness of breath. Risk factors for coronary artery disease include family history, dyslipidemia, diabetes mellitus, obesity, sedentary lifestyle, smoking/tobacco exposure and  post-menopausal state. Past treatments include beta blockers, direct vasodilators, calcium channel blockers and ACE inhibitors. The current treatment provides no improvement. Hypertensive end-organ damage includes kidney disease, CAD/MI, CVA and retinopathy.  Hyperlipidemia This is a chronic problem. The current episode started more than 1  year ago. The problem is uncontrolled. Exacerbating diseases include diabetes and obesity. Pertinent negatives include no chest pain, myalgias or shortness of breath. Risk factors for coronary artery disease include diabetes mellitus, dyslipidemia, hypertension, family history, post-menopausal, a sedentary lifestyle and obesity.       Objective:       11/24/2022   11:11 AM 11/24/2022   10:55 AM 10/26/2022    8:54 AM  Vitals with BMI  Height  5\' 9"    Weight  246 lbs   BMI  36.31   Systolic 146 152 409  Diastolic 82 84 85  Pulse  72 75    BP (!) 146/82 Comment: L arm with manuel cuff  Pulse 72   Ht 5\' 9"  (1.753 m)   Wt 246 lb (111.6 kg)   LMP 12/21/2021   BMI 36.33 kg/m   Wt Readings from Last 3 Encounters:  11/24/22 246 lb (111.6 kg)  09/29/22 243 lb 9.6 oz (110.5 kg)  09/21/22 243 lb (110.2 kg)         CMP ( most recent) CMP     Component Value Date/Time   NA 135 10/26/2022 0904   NA 132 (L) 06/29/2022 0917   K 3.3 (L) 10/26/2022 0904   CL 99 10/26/2022 0904   CO2 23 10/26/2022 0904   GLUCOSE 362 (H) 10/26/2022 0904   BUN 37 (H) 10/26/2022 0904   BUN 46 (H) 06/29/2022 0917   CREATININE 3.62 (H) 10/26/2022 0904   CALCIUM 8.5 (L) 10/26/2022 0904   CALCIUM 8.6 (L) 10/26/2022 0904   PROT 7.5 06/29/2022 0917   ALBUMIN 3.4 (L) 10/26/2022 0904   ALBUMIN 4.1 06/29/2022 0917   AST 11 06/29/2022 0917   ALT 9 06/29/2022 0917   ALKPHOS 107 06/29/2022 0917   BILITOT 0.3 06/29/2022 0917   GFRNONAA 15 (L) 10/26/2022 0904   GFRAA 51 (L) 03/30/2020 1059     Diabetic Labs (most recent): Lab Results  Component Value Date   HGBA1C  14.5 (A) 11/24/2022   HGBA1C 7.9 (H) 04/22/2022   HGBA1C 9.6 (H) 01/13/2022     Lipid Panel ( most recent) Lipid Panel     Component Value Date/Time   CHOL 194 06/29/2022 0917   TRIG 480 (H) 06/29/2022 0917   HDL 42 06/29/2022 0917   CHOLHDL 4.6 (H) 06/29/2022 0917   CHOLHDL 4.9 02/12/2019 0537   VLDL 77 (H) 02/12/2019 0537   LDLCALC 76 06/29/2022 0917   LABVLDL 76 (H) 06/29/2022 0917      Lab Results  Component Value Date   TSH 1.880 06/29/2022   TSH 1.115 04/20/2022   TSH 1.470 03/30/2020   TSH 1.380 07/31/2019   FREET4 1.22 06/29/2022       Assessment & Plan:   1. Type 2 diabetes mellitus with stage 4 chronic kidney disease, with long-term current use of insulin (HCC)  - [name] has currently uncontrolled symptomatic type 2 DM since  52 years of age.  She missed her appointment since September 2023.  She returns with a meter showing average blood glucose of 354 for the last 14 days out of 20 readings.  For unclear reasons, she is on plain NovoLog 10 units 3 times daily instead of her NovoLog 70/30 given to her during her last visit.   -Her point-of-care A1c is 14.5% today. She denies hypoglycemia.   Recent labs reviewed. - I had a long discussion with her about the possible risk factors and  the pathology behind its  diabetes and its complications. -her diabetes is complicated by CVA, CHF, coronary artery disease, retinopathy, nephropathy, obesity/sedentary life, polypharmacy and she remains at exceedingly high risk for more acute and chronic complications which include CAD, CVA, CKD, retinopathy, and neuropathy. These are all discussed in detail with her.  - I discussed all available options of managing her diabetes including de-escalation of medications. I have counseled her on Food as Medicine by adopting a Whole Food , Plant Predominant  ( WFPP) nutrition as recommended by [employer] of Lifestyle Medicine. Patient is encouraged to switch to  unprocessed  or minimally processed  complex starch, adequate protein intake (mainly plant source), minimal liquid fat, plenty of fruits, and vegetables. -  she is advised to stick to a routine mealtimes to eat 3 complete meals a day and snack only when necessary ( to snack only to correct hypoglycemia BG <70 day time or <100 at night).   - she has disengaged from self-care, acknowledges that there is a room for improvement in her food and drink choices. - Suggestion is made for her to avoid simple carbohydrates  from her diet including Cakes, Sweet Desserts, Ice Cream, Soda (diet and regular), Sweet Tea, Candies, Chips, Cookies, Store Bought Juices, Alcohol , Artificial Sweeteners,  Coffee Creamer, and "Sugar-free" Products, Lemonade. This will help patient to have more stable blood glucose profile and potentially avoid unintended weight gain.  The following Lifestyle Medicine recommendations according to American College of Lifestyle Medicine  [facility]) were discussed and and offered to patient and she  agrees to start the journey:  A. Whole Foods, Plant-Based Nutrition comprising of fruits and vegetables, plant-based proteins, whole-grain carbohydrates was discussed in detail with the patient.   A list for source of those nutrients were also provided to the patient.  Patient will use only water or unsweetened tea for hydration. B.  The need to stay away from risky substances including alcohol, smoking; obtaining 7 to 9 hours of restorative sleep, at least 150 minutes of moderate intensity exercise weekly, the importance of healthy social connections,  and stress management techniques were discussed. C.  A full color page of  Calorie density of various food groups per pound showing examples of each food groups was provided to the patient.   - she has an appointment with a nutritionist in [place], encouraged to keep that appointment.     - I have approached her with the following individualized plan to manage   her diabetes and patient agrees:   In light of her presentation with significantly above target glycemic profile with A1c of 14.5%, she will need multiple daily injections of insulin in order for her to achieve control of diabetes to target. - Accordingly, she is advised to discontinue NovoLog.  She is advised to resume NovoLog 70/30 30 units with breakfast and 30 units with supper only if blood glucose readings are above 90 mg per DL, and only if she is eating.   She is approached to monitor blood glucose 4 times a day-before meals and at bedtime and return in 10-15 days with her meter and logs for reevaluation. - This patient will greatly benefit from a CGM.  I discussed and prescribed the Dexcom device for her. She is advised to continue glipizide 5 mg XL p.o. daily at breakfast  - she is warned not to take insulin without proper monitoring per orders.  - she is encouraged to call clinic for blood glucose levels less than 70 or above 200  mg /dl.  - she will be considered for either low-dose SGLT2 inhibitors or incretin therapy as appropriate next visit.  - Specific targets for  A1c;  LDL, HDL,  and Triglycerides were discussed with the patient.  2) Blood Pressure /Hypertension:  -Her blood pressure is not controlled to target.  Unfortunately, she is still hesitant to take her blood pressure medications. -She is a former smoker.  She has a number of medications to take for blood pressure and encouraged to resume and continue: amlodipine/benazepril 10/40 mg p.o. daily, clonidine 0.3 mg patch weekly, Lasix 40 mg daily, isosorbide-hydralazine 28-37.5 mg 3 times daily, labetalol 300 mg p.o. twice a day.  This is an adequate and probably more than adequate prescription for her hypertension.  There is a questionable compliance with medications in this patient.   3) Lipids/Hyperlipidemia: Review of her recent lipid panel showed LDL controlled at 76.  She is advised to continue her Crestor 40 mg p.o.  nightly.  Side effects and precautions discussed with her.  4)  Weight/Diet:  Body mass index is 36.33 kg/m.  -   clearly complicating her diabetes care.   she is  a candidate for weight loss. I discussed with her the fact that loss of 5 - 10% of her  current body weight will have the most impact on her diabetes management.  The above detailed  ACLM recommendations for nutrition, exercise, sleep, social life, avoidance of risky substances, the need for restorative sleep   information will also detailed on discharge instructions.  5) Chronic Care/Health Maintenance:  -she  is on ACEI/ARB and Statin medications and  is encouraged to initiate and continue to follow up with Ophthalmology, Dentist, nephrology, podiatrist at least yearly or according to recommendations, and advised to  quit tobacco products/smoking. I have recommended yearly flu vaccine and pneumonia vaccine at least every 5 years; moderate intensity exercise for up to 150 minutes weekly; and  sleep for 7- 9 hours a day.  - she is  advised to maintain close follow up with Jackie Plum, MD for primary care needs, as well as her other providers for optimal and coordinated care.   I spent  41  minutes in the care of the patient today including review of labs from CMP, Lipids, Thyroid Function, Hematology (current and previous including abstractions from other facilities); face-to-face time discussing  her blood glucose readings/logs, discussing hypoglycemia and hyperglycemia episodes and symptoms, medications doses, her options of short and long term treatment based on the latest standards of care / guidelines;  discussion about incorporating lifestyle medicine;  and documenting the encounter. Risk reduction counseling performed per USPSTF guidelines to reduce  obesity and cardiovascular risk factors.     Please refer to Patient Instructions for Blood Glucose Monitoring and Insulin/Medications Dosing Guide"  in media tab for additional  information. Please  also refer to " Patient Self Inventory" in the Media  tab for reviewed elements of pertinent patient history.  [name] participated in the discussions, expressed understanding, and voiced agreement with the above plans.  All questions were answered to her satisfaction. she is encouraged to contact clinic should she have any questions or concerns prior to her return visit.   Follow up plan: - Return in about 10 days (around 12/04/2022) for F/U with Meter/CGM /Logs Only - no Labs.  Marquis Lunch, MD [facility] [address] [place], [place] [address] Phone: [phone]  Fax: [phone]    11/24/2022,  4:38 PM  This note was partially dictated with voice recognition software. Similar sounding words can be transcribed inadequately or may not  be corrected upon review.

## 2022-12-01 ENCOUNTER — Ambulatory Visit (INDEPENDENT_AMBULATORY_CARE_PROVIDER_SITE_OTHER): Payer: Medicaid Other

## 2022-12-01 VITALS — BP 138/82 | Ht 69.0 in | Wt 246.0 lb

## 2022-12-01 DIAGNOSIS — E1122 Type 2 diabetes mellitus with diabetic chronic kidney disease: Secondary | ICD-10-CM

## 2022-12-01 DIAGNOSIS — N184 Chronic kidney disease, stage 4 (severe): Secondary | ICD-10-CM | POA: Diagnosis not present

## 2022-12-01 DIAGNOSIS — Z794 Long term (current) use of insulin: Secondary | ICD-10-CM | POA: Diagnosis not present

## 2022-12-01 NOTE — Progress Notes (Signed)
Pt brings her new Dexcom G7 in today. Went over application/usage instructions for receiver and sensors. Pt attempted to add the app to her phone. However, her phone was not compatible with the Dexcom app. Placed sensor on pts R arm. She voiced understanding on how to change/apply new sensor in 10 days. Advised her to call if she has any issues/questions with the G7.

## 2022-12-01 NOTE — Patient Instructions (Signed)
Pt advised to call with any questions/concerns 

## 2022-12-02 ENCOUNTER — Ambulatory Visit (HOSPITAL_COMMUNITY)
Admission: RE | Admit: 2022-12-02 | Discharge: 2022-12-02 | Disposition: A | Discharge status: Home / Self Care | Payer: Medicaid Other | Source: Ambulatory Visit | Attending: Nephrology | Admitting: Nephrology

## 2022-12-02 VITALS — BP 158/91 | HR 78 | Temp 97.1°F | Resp 17

## 2022-12-02 DIAGNOSIS — N184 Chronic kidney disease, stage 4 (severe): Secondary | ICD-10-CM

## 2022-12-02 DIAGNOSIS — N179 Acute kidney failure, unspecified: Secondary | ICD-10-CM | POA: Diagnosis present

## 2022-12-02 LAB — RENAL FUNCTION PANEL
Albumin: 3.5 g/dL (ref 3.5–5.0)
Anion gap: 10 (ref 5–15)
BUN: 28 mg/dL — ABNORMAL HIGH (ref 6–20)
CO2: 21 mmol/L — ABNORMAL LOW (ref 22–32)
Calcium: 8 mg/dL — ABNORMAL LOW (ref 8.9–10.3)
Chloride: 106 mmol/L (ref 98–111)
Creatinine, Ser: 4 mg/dL — ABNORMAL HIGH (ref 0.44–1.00)
GFR, Estimated: 13 mL/min — ABNORMAL LOW (ref >60)
Glucose, Bld: 160 mg/dL — ABNORMAL HIGH (ref 70–99)
Phosphorus: 4 mg/dL (ref 2.5–4.6)
Potassium: 3.5 mmol/L (ref 3.5–5.1)
Sodium: 137 mmol/L (ref 135–145)

## 2022-12-02 LAB — IRON AND TIBC
Iron: 68 ug/dL (ref 28–170)
Saturation Ratios: 19 % (ref 10.4–31.8)
TIBC: 361 ug/dL (ref 250–450)
UIBC: 293 ug/dL

## 2022-12-02 LAB — POCT HEMOGLOBIN-HEMACUE: Hemoglobin: 10.9 g/dL — ABNORMAL LOW (ref 12.0–15.0)

## 2022-12-02 LAB — FERRITIN: Ferritin: 210 ng/mL (ref 11–307)

## 2022-12-02 MED ORDER — EPOETIN ALFA-EPBX 10000 UNIT/ML IJ SOLN
INTRAMUSCULAR | Status: AC
  Filled 2022-12-02: qty 1

## 2022-12-02 MED ORDER — EPOETIN ALFA-EPBX 10000 UNIT/ML IJ SOLN
10000.0000 [IU] | INTRAMUSCULAR | Status: DC
  Administered 2022-12-02: 10000 [IU] via SUBCUTANEOUS

## 2022-12-04 LAB — PTH, INTACT AND CALCIUM
Calcium, Total (PTH): 8.1 mg/dL — ABNORMAL LOW (ref 8.7–10.2)
PTH: 343 pg/mL — ABNORMAL HIGH (ref 15–65)

## 2022-12-07 ENCOUNTER — Ambulatory Visit: Payer: Medicaid Other | Admitting: Podiatry

## 2022-12-07 ENCOUNTER — Encounter: Payer: Self-pay | Admitting: Podiatry

## 2022-12-07 DIAGNOSIS — E1142 Type 2 diabetes mellitus with diabetic polyneuropathy: Secondary | ICD-10-CM

## 2022-12-07 DIAGNOSIS — L97522 Non-pressure chronic ulcer of other part of left foot with fat layer exposed: Secondary | ICD-10-CM

## 2022-12-07 DIAGNOSIS — Z794 Long term (current) use of insulin: Secondary | ICD-10-CM

## 2022-12-07 NOTE — Progress Notes (Signed)
Subjective:  Patient ID: [name], female    DOB: 06/16/1971,   MRN: [id number]  Chief Complaint  Patient presents with   Blister    Blister on left hallux has open, blood & fluid. Diabetic. Patient administers insulin and oral medication for diabetes control.     52 y.o. female presents for concern of blister on her toe that has opened with blood and fluid. Relates she noticed a blister forming about a week ago and it popped the next day. Relates she has been keeping betadine on it a trying to keep it covered with a Band-Aid.  History or right partial hallux amputation. Patient is diabetic and last A1c was  Lab Results  Component Value Date   HGBA1C 14.5 (A) 11/24/2022   . She is following with endocrinology who are working on her blood sugars and getting them under control.   PCP:  Jackie Plum, MD    . Denies any other pedal complaints. Denies n/v/f/c.   Past Medical History:  Diagnosis Date   Anemia    Anxiety    Arthritis    Asthma    Cataract    Mixed form OU   CKD (chronic kidney disease)    Coronary artery disease    Depression    Diabetes mellitus    Diabetic retinopathy (HCC)    NPDR OU   Hyperlipidemia    Hypertension    Hypertensive retinopathy    OU   Left thyroid nodule    diagnosed 07/2018   PAC (premature atrial contraction) 02/15/2021   Pseudotumor cerebri    Sleep apnea    Uses a cpap   Stroke (HCC)    Vitamin D deficiency     Objective:  Physical Exam: Vascular: DP/PT pulses 2/4 bilateral. CFT <3 seconds. Absent hair growth on digits. Edema noted to bilateral lower extremities. Xerosis noted bilaterally.  Skin. No lacerations or abrasions bilateral feet. Nails 1-5 bilateral  are normal in appearance. Dorsal left hallux IPJ ulceration noted about 0.8 cm x 0.8 cm x 0.2 cm with granular base and surrounding slough and hyperkeratosis.  Musculoskeletal: MMT 5/5 bilateral lower extremities in DF, PF, Inversion and Eversion. Deceased ROM in  DF of ankle joint.  Neurological: Sensation intact to light touch. Protective sensation diminished bilateral.    Assessment:   1. Skin ulcer of left great toe with fat layer exposed (HCC)   2. Type 2 diabetes mellitus with diabetic polyneuropathy, with long-term current use of insulin (HCC)      Plan:  Patient was evaluated and treated and all questions answered. Ulcer left great toe IPJ dorsum with fat layer exposed  -Debridement as below. -Dressed with betadine, DSD. -Off-loading with surgical shoe. Dispensed today.  -No abx indicated.  -Discussed glucose control and proper protein-rich diet.  -Discussed if any worsening redness, pain, fever or chills to call or may need to report to the emergency room. Patient expressed understanding.   Procedure: Excisional Debridement of Wound Rationale: Removal of non-viable soft tissue from the wound to promote healing.  Anesthesia: none Pre-Debridement Wound Measurements: Overlying necrotic tissue  Post-Debridement Wound Measurements: 0.8 cm x 0.8 cm x 0.2 cm  Type of Debridement: Sharp Excisional Tissue Removed: Non-viable soft tissue Depth of Debridement: subcutaneous tissue. Technique: Sharp excisional debridement to bleeding, viable wound base.  Dressing: Dry, sterile, compression dressing. Disposition: Patient tolerated procedure well. Patient to return in 2 week for follow-up.  Return in about 2 weeks (around 12/21/2022) for wound check.  Lorenda Peck, DPM

## 2022-12-19 ENCOUNTER — Ambulatory Visit: Payer: Medicaid Other | Admitting: "Endocrinology

## 2022-12-21 ENCOUNTER — Encounter: Payer: Self-pay | Admitting: "Endocrinology

## 2022-12-21 ENCOUNTER — Encounter (HOSPITAL_COMMUNITY): Payer: Medicaid Other

## 2022-12-21 ENCOUNTER — Encounter: Payer: Self-pay | Admitting: Podiatry

## 2022-12-21 ENCOUNTER — Ambulatory Visit (INDEPENDENT_AMBULATORY_CARE_PROVIDER_SITE_OTHER): Payer: Medicaid Other

## 2022-12-21 ENCOUNTER — Ambulatory Visit: Payer: Medicaid Other | Admitting: "Endocrinology

## 2022-12-21 ENCOUNTER — Ambulatory Visit: Payer: Medicaid Other | Admitting: Podiatry

## 2022-12-21 VITALS — BP 158/80 | HR 84 | Ht 69.0 in | Wt 248.6 lb

## 2022-12-21 DIAGNOSIS — L97522 Non-pressure chronic ulcer of other part of left foot with fat layer exposed: Secondary | ICD-10-CM | POA: Diagnosis not present

## 2022-12-21 DIAGNOSIS — E1142 Type 2 diabetes mellitus with diabetic polyneuropathy: Secondary | ICD-10-CM | POA: Diagnosis not present

## 2022-12-21 DIAGNOSIS — Z794 Long term (current) use of insulin: Secondary | ICD-10-CM

## 2022-12-21 DIAGNOSIS — E782 Mixed hyperlipidemia: Secondary | ICD-10-CM

## 2022-12-21 DIAGNOSIS — N184 Chronic kidney disease, stage 4 (severe): Secondary | ICD-10-CM

## 2022-12-21 DIAGNOSIS — E1122 Type 2 diabetes mellitus with diabetic chronic kidney disease: Secondary | ICD-10-CM | POA: Diagnosis not present

## 2022-12-21 DIAGNOSIS — I10 Essential (primary) hypertension: Secondary | ICD-10-CM | POA: Diagnosis not present

## 2022-12-21 DIAGNOSIS — L97524 Non-pressure chronic ulcer of other part of left foot with necrosis of bone: Secondary | ICD-10-CM

## 2022-12-21 MED ORDER — INSULIN ASPART PROT & ASPART (70-30 MIX) 100 UNIT/ML PEN: 30.0000 [IU] | PEN_INJECTOR | Freq: Two times a day (BID) | SUBCUTANEOUS | 2 refills | Status: DC

## 2022-12-21 MED ORDER — DEXCOM G7 SENSOR MISC: 2 refills | Status: DC

## 2022-12-21 MED ORDER — DOXYCYCLINE HYCLATE 100 MG PO TABS: 100.0000 mg | ORAL_TABLET | Freq: Two times a day (BID) | ORAL | 0 refills | Status: AC

## 2022-12-21 NOTE — Progress Notes (Signed)
12/21/2022, 9:10 PM  Endocrinology follow-up note   Subjective:    Patient ID: [name], female    DOB: [birth year].  [name] is being seen in follow-up after she was seen in consultation for management of currently uncontrolled symptomatic diabetes requested by  Jackie Plum, MD.    Past Medical History:  Diagnosis Date   Anemia    Anxiety    Arthritis    Asthma    Cataract    Mixed form OU   CKD (chronic kidney disease)    Coronary artery disease    Depression    Diabetes mellitus    Diabetic retinopathy (HCC)    NPDR OU   Hyperlipidemia    Hypertension    Hypertensive retinopathy    OU   Left thyroid nodule    diagnosed 07/2018   PAC (premature atrial contraction) 02/15/2021   Pseudotumor cerebri    Sleep apnea    Uses a cpap   Stroke (HCC)    Vitamin D deficiency     Past Surgical History:  Procedure Laterality Date   ACHILLES TENDON REPAIR Right    ACHILLES TENDON SURGERY Left 02/19/2021   Procedure: ACHILLES TENDON REPAIR WITH GRAFT;  Surgeon: Candelaria Stagers, DPM;  Location: [facility];  Service: Podiatry;  Laterality: Left;  BLOCK   AMPUTATION TOE Right 05/28/2021   Procedure: AMPUTATION TOE;  Surgeon: Louann Sjogren, MD;  Location: [facility] OR;  Service: Podiatry;  Laterality: Right;  Surgical team will do block   AV FISTULA PLACEMENT Left 07/29/2022   Procedure: LEFT ARTERIOVENOUS (AV) FISTULA CREATION;  Surgeon: Maeola Harman, MD;  Location: [facility] OR;  Service: Vascular;  Laterality: Left;   GASTROC RECESSION EXTREMITY Left 02/19/2021   Procedure: GASTROC RECESSION EXTREMITY;  Surgeon: Candelaria Stagers, DPM;  Location: [facility];  Service: Podiatry;  Laterality: Left;  Block   TUBAL LIGATION      Social History   Socioeconomic History   Marital status: Single    Spouse name: Not on file   Number of children: 4   Years of education: Not on file   Highest education  level: Not on file  Occupational History   Occupation: unemployed  Tobacco Use   Smoking status: Former    Packs/day: 0.50    Years: 30.00    Additional pack years: 0.00    Total pack years: 15.00    Types: Cigarettes    Quit date: 2017    Years since quitting: 7.3   Smokeless tobacco: Never  Vaping Use   Vaping Use: Never used  Substance and Sexual Activity   Alcohol use: Not Currently    Alcohol/week: 0.0 standard drinks of alcohol    Comment: rare   Drug use: Yes    Types: Marijuana    Comment: occasional   Sexual activity: Not Currently    Partners: Male    Birth control/protection: Surgical  Other Topics Concern   Not on file  Social History Narrative   Lives with 3 kids   Right handed   Drinks 2 cups caffeine daily   Social Determinants of Health   Financial Resource Strain: Not on file  Food Insecurity: Patient Declined (04/20/2022)   Hunger Vital Sign    Worried About Running Out of Food in the Last Year: Patient declined    Ran Out of Food in the Last Year: Patient declined  Transportation Needs: Patient Declined (04/20/2022)  PRAPARE - Administrator, Civil Service (Medical): Patient declined    Lack of Transportation (Non-Medical): Patient declined  Physical Activity: Inactive (02/15/2021)   Exercise Vital Sign    Days of Exercise per Week: 0 days    Minutes of Exercise per Session: 0 min  Stress: Stress Concern Present (02/15/2021)   [employer] of Occupational Health - Occupational Stress Questionnaire    Feeling of Stress : Very much  Social Connections: Moderately Isolated (02/15/2021)   Social Connection and Isolation Panel [NHANES]    Frequency of Communication with Friends and Family: Three times a week    Frequency of Social Gatherings with Friends and Family: Three times a week    Attends Religious Services: More than 4 times per year    Active Member of Clubs or Organizations: No    Attends Banker Meetings: Never     Marital Status: Never married    Family History  Problem Relation Age of Onset   Asthma Mother    Diabetes Father    Hypertension Father    Kidney disease Father    Heart attack Father    Heart failure Father    Hypertension Sister    Asthma Sister    Congestive Heart Failure Maternal Aunt    Diabetes Maternal Grandmother    Congestive Heart Failure Maternal Grandmother    Lung disease Maternal Grandmother    Asthma Other    Hyperlipidemia Other    Hypertension Other    Cancer Other     Outpatient Encounter Medications as of 12/21/2022  Medication Sig   Accu-Chek FastClix Lancets MISC 1 each by Other route 4 (four) times daily.   ACCU-CHEK GUIDE test strip 1 each by Other route 4 (four) times daily. for testing   albuterol (VENTOLIN HFA) 108 (90 Base) MCG/ACT inhaler Inhale 1-2 puffs into the lungs every 6 (six) hours as needed for wheezing or shortness of breath.   amLODipine (NORVASC) 10 MG tablet Take 1 tablet (10 mg total) by mouth daily.   aspirin 81 MG EC tablet Take 1 tablet (81 mg total) by mouth daily.   blood glucose meter kit and supplies KIT Dispense based on patient and insurance preference. Use up to four times daily as directed. (FOR ICD-9 250.00, 250.01).   Blood Glucose Monitoring Suppl (ACCU-CHEK GUIDE ME) w/Device KIT Use to check blood glucose four times daily   calcitRIOL (ROCALTROL) 0.25 MCG capsule Take 0.25 mcg by mouth daily.   cloNIDine (CATAPRES - DOSED IN MG/24 HR) 0.3 mg/24hr patch Place 1 patch (0.3 mg total) onto the skin once a week. (Patient taking differently: Place 0.3 mg onto the skin every Wednesday.)   Continuous Blood Gluc Receiver (DEXCOM G7 RECEIVER) DEVI Use to monitor BG continuously   Continuous Glucose Sensor (DEXCOM G7 SENSOR) MISC Change sensor every 10 days   doxycycline (VIBRA-TABS) 100 MG tablet Take 1 tablet (100 mg total) by mouth 2 (two) times daily for 10 days.   EPINEPHrine 0.3 mg/0.3 mL IJ SOAJ injection Inject 0.3 mg  into the muscle as needed for anaphylaxis.   fenofibrate (TRICOR) 145 MG tablet Take 145 mg by mouth in the morning.   fluticasone (FLONASE) 50 MCG/ACT nasal spray Place 2 sprays into both nostrils daily as needed for allergies or rhinitis.   furosemide (LASIX) 40 MG tablet Take 40 mg by mouth in the morning.   glipiZIDE (GLUCOTROL) 10 MG tablet Take 10 mg by mouth daily before breakfast.  hydrOXYzine (VISTARIL) 25 MG capsule Take 25 mg by mouth at bedtime.   insulin aspart protamine - aspart (NOVOLOG 70/30 MIX) (70-30) 100 UNIT/ML FlexPen Inject 30-40 Units into the skin 2 (two) times daily with a meal.   Insulin Syringes, Disposable, U-100 1 ML MISC 1 application by Does not apply route 2 (two) times a day.   ipratropium-albuterol (DUONEB) 0.5-2.5 (3) MG/3ML SOLN Take 3 mLs by nebulization every 4 (four) hours as needed.   isosorbide-hydrALAZINE (BIDIL) 20-37.5 MG tablet Take 1 tablet by mouth 3 (three) times daily. (Patient taking differently: Take 2 tablets by mouth 3 (three) times daily.)   labetalol (NORMODYNE) 300 MG tablet Take 300 mg by mouth 2 (two) times daily.   rosuvastatin (CRESTOR) 40 MG tablet Take 40 mg by mouth in the morning.   [DISCONTINUED] Continuous Blood Gluc Sensor (DEXCOM G7 SENSOR) MISC Change sensor every 10 days   [DISCONTINUED] insulin aspart protamine - aspart (NOVOLOG 70/30 MIX) (70-30) 100 UNIT/ML FlexPen Inject 30 Units into the skin 2 (two) times daily with a meal.   No facility-administered encounter medications on file as of 12/21/2022.    ALLERGIES: Allergies  Allergen Reactions   Bee Pollen Other (See Comments)    Seasonal Allergies   Dust Mite Extract Cough    Sneezing & Cough   Mixed Ragweed Itching    VACCINATION STATUS: Immunization History  Administered Date(s) Administered   Influenza Split 04/23/2013    Diabetes She presents for her follow-up diabetic visit. She has type 2 diabetes mellitus. Onset time: She was diagnosed at approximate  age of 31 years. Her disease course has been improving. There are no hypoglycemic associated symptoms. Pertinent negatives for hypoglycemia include no confusion, headaches, pallor or seizures. Pertinent negatives for diabetes include no blurred vision, no chest pain, no fatigue, no polydipsia, no polyphagia and no polyuria. There are no hypoglycemic complications. Symptoms are improving. Diabetic complications include a CVA, heart disease, nephropathy and retinopathy. Risk factors for coronary artery disease include diabetes mellitus, dyslipidemia, family history, hypertension, obesity, post-menopausal, sedentary lifestyle and tobacco exposure. Current diabetic treatment includes insulin injections and oral agent (monotherapy). Her weight is fluctuating minimally. She is following a generally unhealthy diet. When asked about meal planning, she reported none. She has had a previous visit with a dietitian. She never participates in exercise. Her home blood glucose trend is decreasing steadily. Her breakfast blood glucose range is generally >200 mg/dl. Her lunch blood glucose range is generally >200 mg/dl. Her dinner blood glucose range is generally >200 mg/dl. Her bedtime blood glucose range is generally >200 mg/dl. Her overall blood glucose range is >200 mg/dl. (She has received a Dexcom CGM device.  She is utilizing and benefiting from it.  Her review shows 36% time range, 40% level 1 hyperglycemia, 23% level 2 hyperglycemia.  This is a significant improvement for her.  Her average blood glucose is 208 for the last 14 days.      ) An ACE inhibitor/angiotensin II receptor blocker is being taken. Eye exam is current.  Hypertension This is a chronic problem. The current episode started more than 1 year ago. The problem is uncontrolled. Pertinent negatives include no blurred vision, chest pain, headaches, palpitations or shortness of breath. Risk factors for coronary artery disease include family history,  dyslipidemia, diabetes mellitus, obesity, sedentary lifestyle, smoking/tobacco exposure and post-menopausal state. Past treatments include beta blockers, direct vasodilators, calcium channel blockers and ACE inhibitors. The current treatment provides no improvement. Hypertensive end-organ damage includes kidney  disease, CAD/MI, CVA and retinopathy.  Hyperlipidemia This is a chronic problem. The current episode started more than 1 year ago. The problem is uncontrolled. Exacerbating diseases include diabetes and obesity. Pertinent negatives include no chest pain, myalgias or shortness of breath. Risk factors for coronary artery disease include diabetes mellitus, dyslipidemia, hypertension, family history, post-menopausal, a sedentary lifestyle and obesity.     Objective:       12/21/2022    1:33 PM 12/02/2022    8:47 AM 12/01/2022   10:40 AM  Vitals with BMI  Height 5\' 9"   5\' 9"   Weight 248 lbs 10 oz  246 lbs  BMI 36.69  36.31  Systolic 158 158 161  Diastolic 80 91 82  Pulse 84 78     BP (!) 158/80   Pulse 84   Ht 5\' 9"  (1.753 m)   Wt 248 lb 9.6 oz (112.8 kg)   LMP 12/21/2021   BMI 36.71 kg/m   Wt Readings from Last 3 Encounters:  12/21/22 248 lb 9.6 oz (112.8 kg)  12/01/22 246 lb (111.6 kg)  11/24/22 246 lb (111.6 kg)      CMP ( most recent) CMP     Component Value Date/Time   NA 137 12/02/2022 0854   NA 132 (L) 06/29/2022 0917   K 3.5 12/02/2022 0854   CL 106 12/02/2022 0854   CO2 21 (L) 12/02/2022 0854   GLUCOSE 160 (H) 12/02/2022 0854   BUN 28 (H) 12/02/2022 0854   BUN 46 (H) 06/29/2022 0917   CREATININE 4.00 (H) 12/02/2022 0854   CALCIUM 8.0 (L) 12/02/2022 0854   CALCIUM 8.1 (L) 12/02/2022 0853   PROT 7.5 06/29/2022 0917   ALBUMIN 3.5 12/02/2022 0854   ALBUMIN 4.1 06/29/2022 0917   AST 11 06/29/2022 0917   ALT 9 06/29/2022 0917   ALKPHOS 107 06/29/2022 0917   BILITOT 0.3 06/29/2022 0917   GFRNONAA 13 (L) 12/02/2022 0854   GFRAA 51 (L) 03/30/2020 1059      Diabetic Labs (most recent): Lab Results  Component Value Date   HGBA1C 14.5 (A) 11/24/2022   HGBA1C 7.9 (H) 04/22/2022   HGBA1C 9.6 (H) 01/13/2022     Lipid Panel ( most recent) Lipid Panel     Component Value Date/Time   CHOL 194 06/29/2022 0917   TRIG 480 (H) 06/29/2022 0917   HDL 42 06/29/2022 0917   CHOLHDL 4.6 (H) 06/29/2022 0917   CHOLHDL 4.9 02/12/2019 0537   VLDL 77 (H) 02/12/2019 0537   LDLCALC 76 06/29/2022 0917   LABVLDL 76 (H) 06/29/2022 0917      Lab Results  Component Value Date   TSH 1.880 06/29/2022   TSH 1.115 04/20/2022   TSH 1.470 03/30/2020   TSH 1.380 07/31/2019   FREET4 1.22 06/29/2022       Assessment & Plan:   1. Type 2 diabetes mellitus with stage 4 chronic kidney disease, with long-term current use of insulin (HCC)  - [name] has currently uncontrolled symptomatic type 2 DM since  52 years of age.  She has received a Dexcom CGM device.  She is utilizing and benefiting from it.  Her review shows 36% time range, 40% level 1 hyperglycemia, 23% level 2 hyperglycemia.  This is a significant improvement for her.  Her average blood glucose is 208 for the last 14 days.   Recent labs reviewed. - I had a long discussion with her about the possible risk factors and  the pathology behind its  diabetes and its complications. -her diabetes is complicated by CVA, CHF, coronary artery disease, retinopathy, nephropathy, obesity/sedentary life, polypharmacy and she remains at exceedingly high risk for more acute and chronic complications which include CAD, CVA, CKD, retinopathy, and neuropathy. These are all discussed in detail with her.  - I discussed all available options of managing her diabetes including de-escalation of medications. I have counseled her on Food as Medicine by adopting a Whole Food , Plant Predominant  ( WFPP) nutrition as recommended by [employer] of Lifestyle Medicine. Patient is encouraged to switch to  unprocessed or  minimally processed  complex starch, adequate protein intake (mainly plant source), minimal liquid fat, plenty of fruits, and vegetables. -  she is advised to stick to a routine mealtimes to eat 3 complete meals a day and snack only when necessary ( to snack only to correct hypoglycemia BG <70 day time or <100 at night).   - she has not engaged with lifestyle medicine yet, she acknowledges that there is a room for improvement in her food and drink choices. - Suggestion is made for her to avoid simple carbohydrates  from her diet including Cakes, Sweet Desserts, Ice Cream, Soda (diet and regular), Sweet Tea, Candies, Chips, Cookies, Store Bought Juices, Alcohol , Artificial Sweeteners,  Coffee Creamer, and "Sugar-free" Products, Lemonade. This will help patient to have more stable blood glucose profile and potentially avoid unintended weight gain.  The following Lifestyle Medicine recommendations according to American College of Lifestyle Medicine  [facility]) were discussed and and offered to patient and she  agrees to start the journey:  A. Whole Foods, Plant-Based Nutrition comprising of fruits and vegetables, plant-based proteins, whole-grain carbohydrates was discussed in detail with the patient.   A list for source of those nutrients were also provided to the patient.  Patient will use only water or unsweetened tea for hydration. B.  The need to stay away from risky substances including alcohol, smoking; obtaining 7 to 9 hours of restorative sleep, at least 150 minutes of moderate intensity exercise weekly, the importance of healthy social connections,  and stress management techniques were discussed. C.  A full color page of  Calorie density of various food groups per pound showing examples of each food groups was provided to the patient.  She has not seen her nutritionist yet. - I have approached her with the following individualized plan to manage  her diabetes and patient agrees:   She presents  with significantly improved glycemic profile.  This is despite her recent A1c of 14.5%.   -She will continue to need intensive treatment with multiple daily injections of insulin.  I advised her to increase her NovoLog 70/30 to 40 units daily at breakfast, 30 units daily with supper only if blood glucose readings are above 90 mg per DL, and only if she is eating.  She is warned not to take insulin without proper monitoring. -She has a Dexcom CGM now, advised to monitor blood glucose continuously. -She has diabetic left foot ulcer, currently on podiatric care.  She is not a suitable candidate for SGLT2 inhibitors given the CKD stage IV as well as infected left foot diabetic ulcer.  She is advised to continue glipizide 5 mg XL p.o. daily at breakfast  - she is encouraged to call clinic for blood glucose levels less than 70 or above 200 mg /dl.  - she will be GLP-1 receptor agonist therapy as appropriate next visit.  - Specific targets for  A1c;  LDL, HDL,  and Triglycerides were discussed with the patient.  2) Blood Pressure /Hypertension:  -Her blood pressure is not controlled to target.  Unfortunately, she is still hesitant to take her blood pressure medications. -She is a former smoker.  She has a number of medications to take for blood pressure and encouraged to resume and continue: amlodipine/benazepril 10/40 mg p.o. daily, clonidine 0.3 mg patch weekly, Lasix 40 mg daily, isosorbide-hydralazine 28-37.5 mg 3 times daily, labetalol 300 mg p.o. twice a day.  This is an adequate and probably more than adequate prescription for her hypertension.  There is a questionable compliance with medications in this patient.   3) Lipids/Hyperlipidemia: Review of her recent lipid panel showed LDL controlled at 76.  She is advised to continue her Crestor 40 mg p.o. nightly.  Side effects and precautions discussed with her.  4)  Weight/Diet:  Body mass index is 36.71 kg/m.  -   clearly complicating her diabetes  care.   she is  a candidate for weight loss. I discussed with her the fact that loss of 5 - 10% of her  current body weight will have the most impact on her diabetes management.  The above detailed  ACLM recommendations for nutrition, exercise, sleep, social life, avoidance of risky substances, the need for restorative sleep   information will also detailed on discharge instructions.  5) Chronic Care/Health Maintenance:  -she  is on ACEI/ARB and Statin medications and  is encouraged to initiate and continue to follow up with Ophthalmology, Dentist, nephrology, podiatrist at least yearly or according to recommendations, and advised to  quit tobacco products/smoking. I have recommended yearly flu vaccine and pneumonia vaccine at least every 5 years; moderate intensity exercise for up to 150 minutes weekly; and  sleep for 7- 9 hours a day. She is advised to continue close follow-up with her podiatry regarding infected left foot ulcer.  - she is  advised to maintain close follow up with Jackie Plum, MD for primary care needs, as well as her other providers for optimal and coordinated care.  I spent  26  minutes in the care of the patient today including review of labs from CMP, Lipids, Thyroid Function, Hematology (current and previous including abstractions from other facilities); face-to-face time discussing  her blood glucose readings/logs, discussing hypoglycemia and hyperglycemia episodes and symptoms, medications doses, her options of short and long term treatment based on the latest standards of care / guidelines;  discussion about incorporating lifestyle medicine;  and documenting the encounter. Risk reduction counseling performed per USPSTF guidelines to reduce  obesity and cardiovascular risk factors.     Please refer to Patient Instructions for Blood Glucose Monitoring and Insulin/Medications Dosing Guide"  in media tab for additional information. Please  also refer to " Patient Self  Inventory" in the Media  tab for reviewed elements of pertinent patient history.  [name] participated in the discussions, expressed understanding, and voiced agreement with the above plans.  All questions were answered to her satisfaction. she is encouraged to contact clinic should she have any questions or concerns prior to her return visit.   Follow up plan: - Return in about 3 months (around 03/23/2023) for Bring Meter/CGM Device/Logs- A1c in Office.  Marquis Lunch, MD [facility] [address] [place], [place] [address] Phone: [phone]  Fax: [phone]    12/21/2022, 9:10 PM  This note was partially dictated with voice recognition software. Similar sounding words can  be transcribed inadequately or may not  be corrected upon review.

## 2022-12-21 NOTE — Progress Notes (Signed)
Subjective:  Patient ID: [name], female    DOB: 1970-10-15,   MRN: [id number]  Chief Complaint  Patient presents with   Wound Check    Left foot wound check, minimal drainage, patient stated the wound has a smell to it, has been allowing water to come in contact with the wound in the shower     52 y.o. female presents for follow-up of left foot wound. Relates shere has been drainage and has been changing daily. Relates she is concerned it has worsened.   History or right partial hallux amputation. Patient is diabetic and last A1c was  Lab Results  Component Value Date   HGBA1C 14.5 (A) 11/24/2022   . She is following with endocrinology who are working on her blood sugars and getting them under control.   PCP:  Jackie Plum, MD    . Denies any other pedal complaints. Denies n/v/f/c.   Past Medical History:  Diagnosis Date   Anemia    Anxiety    Arthritis    Asthma    Cataract    Mixed form OU   CKD (chronic kidney disease)    Coronary artery disease    Depression    Diabetes mellitus    Diabetic retinopathy (HCC)    NPDR OU   Hyperlipidemia    Hypertension    Hypertensive retinopathy    OU   Left thyroid nodule    diagnosed 07/2018   PAC (premature atrial contraction) 02/15/2021   Pseudotumor cerebri    Sleep apnea    Uses a cpap   Stroke (HCC)    Vitamin D deficiency     Objective:  Physical Exam: Vascular: DP/PT pulses 2/4 bilateral. CFT <3 seconds. Absent hair growth on digits. Edema noted to bilateral lower extremities. Xerosis noted bilaterally.  Skin. No lacerations or abrasions bilateral feet. Nails 1-5 bilateral  are normal in appearance. Dorsal left hallux IPJ ulceration noted about 0.5 cm x 0.8 cm x 0.2 cm with granular base and surrounding slough and hyperkeratosis.  Musculoskeletal: MMT 5/5 bilateral lower extremities in DF, PF, Inversion and Eversion. Deceased ROM in DF of ankle joint.  Neurological: Sensation intact to light touch.  Protective sensation diminished bilateral.    Assessment:   1. 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 Type 2 diabetes mellitus with diabetic polyneuropathy, with long-term current use of insulin (HCC)       Plan:  Patient was evaluated and treated and all questions answered. Ulcer left great toe IPJ dorsum with fat layer exposed  X-ray left foot reviewed. Pathologic fracture noted to medial distal proximal phalanx of right foot possible osseous erosions noted. Will order MRI for further evaluation.  -Debridement as below. -Dressed with betadine, DSD. -Off-loading with surgical shoe.  -Doxycycline sent to pharmacy.  -ABIs ordered.  -MRI ordered -Discussed concern for possible bone infection. Patient aware she is at risk for toe loss if infection deep in bone.  -Discussed glucose control and proper protein-rich diet.  -Discussed if any worsening redness, pain, fever or chills to call or may need to report to the emergency room. Patient expressed understanding.   Procedure: Excisional Debridement of Wound Rationale: Removal of non-viable soft tissue from the wound to promote healing.  Anesthesia: none Pre-Debridement Wound Measurements: Overlying necrotic tissue  Post-Debridement Wound Measurements: 0.5 cm x 0.8 cm x 0.2 cm  Type of Debridement: Sharp Excisional Tissue Removed: Non-viable soft tissue Depth of Debridement: subcutaneous tissue.  Technique: Sharp excisional debridement to bleeding, viable wound base.  Dressing: Dry, sterile, compression dressing. Disposition: Patient tolerated procedure well. Patient to return in 2 week for follow-up.  Return in about 2 weeks (around 01/04/2023) for wound check.   Louann Sjogren, DPM

## 2022-12-21 NOTE — Patient Instructions (Signed)
                                     Advice for Weight Management  -For most of us the best way to lose weight is by diet management. Generally speaking, diet management means consuming less calories intentionally which over time brings about progressive weight loss.  This can be achieved more effectively by avoiding ultra processed carbohydrates, processed meats, unhealthy fats.    It is critically important to know your numbers: how much calorie you are consuming and how much calorie you need. More importantly, our carbohydrates sources should be unprocessed naturally occurring  complex starch food items.  It is always important to balance nutrition also by  appropriate intake of proteins (mainly plant-based), healthy fats/oils, plenty of fruits and vegetables.   -The American College of Lifestyle Medicine (ACL M) recommends nutrition derived mostly from Whole Food, Plant Predominant Sources example an apple instead of applesauce or apple pie. Eat Plenty of vegetables, Mushrooms, fruits, Legumes, Whole Grains, Nuts, seeds in lieu of processed meats, processed snacks/pastries red meat, poultry, eggs.  Use only water or unsweetened tea for hydration.  The College also recommends the need to stay away from risky substances including alcohol, smoking; obtaining 7-9 hours of restorative sleep, at least 150 minutes of moderate intensity exercise weekly, importance of healthy social connections, and being mindful of stress and seek help when it is overwhelming.    -Sticking to a routine mealtime to eat 3 meals a day and avoiding unnecessary snacks is shown to have a big role in weight control. Under normal circumstances, the only time we burn stored energy is when we are hungry, so allow  some hunger to take place- hunger means no food between appropriate meal times, only water.  It is not advisable to starve.   -It is better to avoid simple carbohydrates including:  Cakes, Sweet Desserts, Ice Cream, Soda (diet and regular), Sweet Tea, Candies, Chips, Cookies, Store Bought Juices, Alcohol in Excess of  1-2 drinks a day, Lemonade,  Artificial Sweeteners, Doughnuts, Coffee Creamers, "Sugar-free" Products, etc, etc.  This is not a complete list.....    -Consulting with certified diabetes educators is proven to provide you with the most accurate and current information on diet.  Also, you may be  interested in discussing diet options/exchanges , we can schedule a visit with [name], RDN, CDE for individualized nutrition education.  -Exercise: If you are able: 30 -60 minutes a day ,4 days a week, or 150 minutes of moderate intensity exercise weekly.    The longer the better if tolerated.  Combine stretch, strength, and aerobic activities.  If you were told in the past that you have high risk for cardiovascular diseases, or if you are currently symptomatic, you may seek evaluation by your heart doctor prior to initiating moderate to intense exercise programs.                                  Additional Care Considerations for Diabetes/Prediabetes   -Diabetes  is a chronic disease.  The most important care consideration is regular follow-up with your diabetes care provider with the goal being avoiding or delaying its complications and to take advantage of advances in medications and technology.  If appropriate actions are taken early enough, type 2 diabetes can even be   reversed.  Seek information from the right source.  - Whole Food, Plant Predominant Nutrition is highly recommended: Eat Plenty of vegetables, Mushrooms, fruits, Legumes, Whole Grains, Nuts, seeds in lieu of processed meats, processed snacks/pastries red meat, poultry, eggs as recommended by American College of  Lifestyle Medicine (ACLM).  -Type 2 diabetes is known to coexist with other important comorbidities such as high blood pressure and high cholesterol.  It is critical to control not only the  diabetes but also the high blood pressure and high cholesterol to minimize and delay the risk of complications including coronary artery disease, stroke, amputations, blindness, etc.  The good news is that this diet recommendation for type 2 diabetes is also very helpful for managing high cholesterol and high blood blood pressure.  - Studies showed that people with diabetes will benefit from a class of medications known as ACE inhibitors and statins.  Unless there are specific reasons not to be on these medications, the standard of care is to consider getting one from these groups of medications at an optimal doses.  These medications are generally considered safe and proven to help protect the heart and the kidneys.    - People with diabetes are encouraged to initiate and maintain regular follow-up with eye doctors, foot doctors, dentists , and if necessary heart and kidney doctors.     - It is highly recommended that people with diabetes quit smoking or stay away from smoking, and get yearly  flu vaccine and pneumonia vaccine at least every 5 years.  See above for additional recommendations on exercise, sleep, stress management , and healthy social connections.      

## 2022-12-28 ENCOUNTER — Ambulatory Visit (HOSPITAL_COMMUNITY)
Admission: RE | Admit: 2022-12-28 | Discharge: 2022-12-28 | Disposition: A | Discharge status: Home / Self Care | Payer: Medicaid Other | Source: Ambulatory Visit | Attending: Podiatry | Admitting: Podiatry

## 2022-12-28 DIAGNOSIS — E1142 Type 2 diabetes mellitus with diabetic polyneuropathy: Secondary | ICD-10-CM | POA: Diagnosis present

## 2022-12-28 DIAGNOSIS — Z794 Long term (current) use of insulin: Secondary | ICD-10-CM | POA: Insufficient documentation

## 2022-12-28 DIAGNOSIS — L97524 Non-pressure chronic ulcer of other part of left foot with necrosis of bone: Secondary | ICD-10-CM | POA: Insufficient documentation

## 2022-12-28 LAB — VAS US PAD ABI
Left ABI: 1.23
Right ABI: 1.16

## 2022-12-30 ENCOUNTER — Ambulatory Visit (HOSPITAL_COMMUNITY)
Admission: RE | Admit: 2022-12-30 | Discharge: 2022-12-30 | Disposition: A | Discharge status: Home / Self Care | Payer: Medicaid Other | Source: Ambulatory Visit | Attending: Nephrology | Admitting: Nephrology

## 2022-12-30 ENCOUNTER — Other Ambulatory Visit: Payer: Self-pay

## 2022-12-30 ENCOUNTER — Telehealth: Payer: Self-pay

## 2022-12-30 ENCOUNTER — Other Ambulatory Visit (HOSPITAL_COMMUNITY): Payer: Self-pay

## 2022-12-30 VITALS — BP 202/107 | HR 84 | Temp 97.9°F | Resp 18

## 2022-12-30 DIAGNOSIS — N184 Chronic kidney disease, stage 4 (severe): Secondary | ICD-10-CM | POA: Insufficient documentation

## 2022-12-30 DIAGNOSIS — N179 Acute kidney failure, unspecified: Secondary | ICD-10-CM | POA: Diagnosis present

## 2022-12-30 MED ORDER — EPOETIN ALFA-EPBX 10000 UNIT/ML IJ SOLN: 10000.0000 [IU] | INTRAMUSCULAR | Status: DC

## 2022-12-30 MED ORDER — CLONIDINE HCL 0.1 MG PO TABS
ORAL_TABLET | ORAL | Status: AC
  Filled 2022-12-30: qty 1

## 2022-12-30 MED ORDER — CLONIDINE HCL 0.1 MG PO TABS
0.1000 mg | ORAL_TABLET | Freq: Once | ORAL | Status: AC | PRN
  Administered 2022-12-30: 0.1 mg via ORAL

## 2022-12-30 NOTE — Telephone Encounter (Signed)
Pharmacy Patient Advocate Encounter  Prior Authorization has been approved    Effective through 06/28/2023

## 2022-12-30 NOTE — Telephone Encounter (Signed)
Patient Advocate Encounter   Received notification from [employer] that prior authorization is required for Dexcom G7 Sensor  Submitted: 12/30/22 Key [id number]  Status is pending

## 2022-12-30 NOTE — Telephone Encounter (Signed)
Patient Advocate Encounter   Received notification from [facility] that prior authorization is required for [facility] G7 Receiver device  Submitted: 12/30/22 Key [id number]  Status is pending

## 2022-12-30 NOTE — Telephone Encounter (Signed)
Pharmacy Patient Advocate Encounter  Prior Authorization has been approved    Effective through 06/28/2023  

## 2022-12-30 NOTE — Progress Notes (Signed)
Pt here for retacrit injection.  B/P not within parameters on arrival or after resting for 15 minutes.  0.1mg  Clonidine given per orders.  B/P rechecked at 30 and 45 minutes and still above parameters.  Per pt, she has not changed her clonidine patch in 2 weeks because she has not gotten it refilled.  Pt rescheduled and instructed to take home meds prior to appt

## 2022-12-31 ENCOUNTER — Ambulatory Visit
Admission: RE | Admit: 2022-12-31 | Discharge: 2022-12-31 | Disposition: A | Discharge status: Home / Self Care | Payer: Medicaid Other | Source: Ambulatory Visit | Attending: Podiatry | Admitting: Podiatry

## 2022-12-31 DIAGNOSIS — E1142 Type 2 diabetes mellitus with diabetic polyneuropathy: Secondary | ICD-10-CM

## 2022-12-31 DIAGNOSIS — L97524 Non-pressure chronic ulcer of other part of left foot with necrosis of bone: Secondary | ICD-10-CM

## 2023-01-03 ENCOUNTER — Ambulatory Visit
Admission: RE | Admit: 2023-01-03 | Discharge: 2023-01-03 | Disposition: A | Discharge status: Home / Self Care | Payer: Medicaid Other | Source: Ambulatory Visit | Attending: Physician Assistant | Admitting: Physician Assistant

## 2023-01-03 DIAGNOSIS — Z1231 Encounter for screening mammogram for malignant neoplasm of breast: Secondary | ICD-10-CM

## 2023-01-04 ENCOUNTER — Ambulatory Visit: Payer: Medicaid Other | Admitting: Podiatry

## 2023-01-04 ENCOUNTER — Encounter: Payer: Self-pay | Admitting: Podiatry

## 2023-01-04 DIAGNOSIS — L97524 Non-pressure chronic ulcer of other part of left foot with necrosis of bone: Secondary | ICD-10-CM | POA: Diagnosis not present

## 2023-01-04 DIAGNOSIS — E1142 Type 2 diabetes mellitus with diabetic polyneuropathy: Secondary | ICD-10-CM

## 2023-01-04 DIAGNOSIS — Z794 Long term (current) use of insulin: Secondary | ICD-10-CM

## 2023-01-04 MED ORDER — DOXYCYCLINE HYCLATE 100 MG PO TABS: 100.0000 mg | ORAL_TABLET | Freq: Two times a day (BID) | ORAL | 0 refills | Status: AC

## 2023-01-04 NOTE — Addendum Note (Signed)
Addended by: Solon Palm on: 01/04/2023 09:25 AM   Modules accepted: Orders

## 2023-01-04 NOTE — Progress Notes (Addendum)
Subjective:  Patient ID: [name], female    DOB: 1971-01-22,   MRN: [id number]  Chief Complaint  Patient presents with   Wound Check    Rm 21  Recheck left great toe ulcer. Pt believes that wound is neither improving or getting worse.     52 y.o. female presents for follow-up of left foot wound. Relates shere has been drainage and has been changing daily. Relates it is doing about the same. No worse.  Patient is diabetic and last A1c was  Lab Results  Component Value Date   HGBA1C 14.5 (A) 11/24/2022   . She is following with endocrinology who are working on her blood sugars and getting them under control.   PCP:  Jackie Plum, MD    . Denies any other pedal complaints. Denies n/v/f/c.   Past Medical History:  Diagnosis Date   Anemia    Anxiety    Arthritis    Asthma    Cataract    Mixed form OU   CKD (chronic kidney disease)    Coronary artery disease    Depression    Diabetes mellitus    Diabetic retinopathy (HCC)    NPDR OU   Hyperlipidemia    Hypertension    Hypertensive retinopathy    OU   Left thyroid nodule    diagnosed 07/2018   PAC (premature atrial contraction) 02/15/2021   Pseudotumor cerebri    Sleep apnea    Uses a cpap   Stroke (HCC)    Vitamin D deficiency     Objective:  Physical Exam: Vascular: DP/PT pulses 2/4 bilateral. CFT <3 seconds. Absent hair growth on digits. Edema noted to bilateral lower extremities. Xerosis noted bilaterally.  Skin. No lacerations or abrasions bilateral feet. Nails 1-5 bilateral  are normal in appearance. Dorsal left hallux IPJ ulceration noted about 0.5 cm x 0.8 cm x 0.2 cm with granular base and surrounding slough and hyperkeratosis. Does probe to bone.  Musculoskeletal: MMT 5/5 bilateral lower extremities in DF, PF, Inversion and Eversion. Deceased ROM in DF of ankle joint.  Neurological: Sensation intact to light touch. Protective sensation diminished bilateral.    - ABI is in normal range.     MRI left foot IMPRESSION: 1. Osteomyelitis of the first proximal phalanx with pathologic fracture. Superficial skin ulceration along the dorsal aspect of the first IP joint with small sinus tract extending to the fracture fragment. No abscess. 2. Mild marrow edema in the first distal phalanx, concerning for early osteomyelitis.  Assessment:   1. 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 Type 2 diabetes mellitus with diabetic polyneuropathy, with long-term current use of insulin (HCC)        Plan:  Patient was evaluated and treated and all questions answered. Ulcer left great toe IPJ dorsum with fat layer exposed  X-ray left foot reviewed. Pathologic fracture noted to medial distal proximal phalanx of right foot possible osseous erosions noted. MRI reviewed and discussed with patient. Discussed pr escence of bone infection and need for amputation. Patient is very hesitant at this point as she does not want to loose any more toes. Discussed amputation vs 6 weeks of antibiotics. Patient would like to think about this for now and try antibiotics and wound care for now and see if it gets any worse. Discussed symptoms to look out for and if any worsening to head to the ED.  -Debridement as below. -Dressed with betadine, DSD. -Off-loading  with surgical shoe.  -Doxycycline sent to pharmacy.  -ABIs reivewed and normal range.  -Discussed concern for possible bone infection. Patient aware she is at risk for toe loss if infection deep in bone.  -Discussed glucose control and proper protein-rich diet.  -Discussed if any worsening redness, pain, fever or chills to call or may need to report to the emergency room. Patient expressed understanding.   Procedure: Excisional Debridement of Wound Rationale: Removal of non-viable soft tissue from the wound to promote healing.  Anesthesia: none Pre-Debridement Wound Measurements: Overlying necrotic tissue  Post-Debridement Wound  Measurements: 0.5 cm x 0.8 cm x 0.2 cm  Type of Debridement: Sharp Excisional Tissue Removed: Non-viable soft tissue Depth of Debridement: subcutaneous tissue. Technique: Sharp excisional debridement to bleeding, viable wound base.  Dressing: Dry, sterile, compression dressing. Disposition: Patient tolerated procedure well. Patient to return in 2 week for follow-up.  Return in about 1 week (around 01/11/2023) for wound check.   Louann Sjogren, DPM

## 2023-01-05 ENCOUNTER — Telehealth: Payer: Self-pay

## 2023-01-05 NOTE — Telephone Encounter (Signed)
Left a message requesting pt return call to the office. 

## 2023-01-06 ENCOUNTER — Ambulatory Visit (HOSPITAL_COMMUNITY)
Admission: RE | Admit: 2023-01-06 | Discharge: 2023-01-06 | Disposition: A | Discharge status: Home / Self Care | Payer: Medicaid Other | Source: Ambulatory Visit | Attending: Nephrology | Admitting: Nephrology

## 2023-01-06 VITALS — BP 118/63 | HR 84 | Temp 97.3°F | Resp 17

## 2023-01-06 DIAGNOSIS — N179 Acute kidney failure, unspecified: Secondary | ICD-10-CM | POA: Diagnosis not present

## 2023-01-06 DIAGNOSIS — N184 Chronic kidney disease, stage 4 (severe): Secondary | ICD-10-CM | POA: Insufficient documentation

## 2023-01-06 LAB — RENAL FUNCTION PANEL
Albumin: 3.1 g/dL — ABNORMAL LOW (ref 3.5–5.0)
Anion gap: 12 (ref 5–15)
BUN: 55 mg/dL — ABNORMAL HIGH (ref 6–20)
CO2: 22 mmol/L (ref 22–32)
Calcium: 7.5 mg/dL — ABNORMAL LOW (ref 8.9–10.3)
Chloride: 100 mmol/L (ref 98–111)
Creatinine, Ser: 5.16 mg/dL — ABNORMAL HIGH (ref 0.44–1.00)
GFR, Estimated: 10 mL/min — ABNORMAL LOW (ref >60)
Glucose, Bld: 210 mg/dL — ABNORMAL HIGH (ref 70–99)
Phosphorus: 4 mg/dL (ref 2.5–4.6)
Potassium: 3.1 mmol/L — ABNORMAL LOW (ref 3.5–5.1)
Sodium: 134 mmol/L — ABNORMAL LOW (ref 135–145)

## 2023-01-06 LAB — POCT HEMOGLOBIN-HEMACUE: Hemoglobin: 10.1 g/dL — ABNORMAL LOW (ref 12.0–15.0)

## 2023-01-06 LAB — WOUND CULTURE

## 2023-01-06 LAB — IRON AND TIBC
Iron: 22 ug/dL — ABNORMAL LOW (ref 28–170)
Saturation Ratios: 8 % — ABNORMAL LOW (ref 10.4–31.8)
TIBC: 281 ug/dL (ref 250–450)
UIBC: 259 ug/dL

## 2023-01-06 LAB — FERRITIN: Ferritin: 224 ng/mL (ref 11–307)

## 2023-01-06 MED ORDER — EPOETIN ALFA-EPBX 10000 UNIT/ML IJ SOLN
10000.0000 [IU] | INTRAMUSCULAR | Status: DC
  Administered 2023-01-06: 10000 [IU] via SUBCUTANEOUS

## 2023-01-06 MED ORDER — EPOETIN ALFA-EPBX 10000 UNIT/ML IJ SOLN
INTRAMUSCULAR | Status: AC
  Filled 2023-01-06: qty 1

## 2023-01-07 HISTORY — PX: TOE SURGERY: SHX1073

## 2023-01-07 LAB — WOUND CULTURE
MICRO NUMBER:: 15014620
SPECIMEN QUALITY:: ADEQUATE

## 2023-01-08 LAB — PTH, INTACT AND CALCIUM
Calcium, Total (PTH): 7.8 mg/dL — ABNORMAL LOW (ref 8.7–10.2)
PTH: 345 pg/mL — ABNORMAL HIGH (ref 15–65)

## 2023-01-11 ENCOUNTER — Encounter: Payer: Self-pay | Admitting: Podiatry

## 2023-01-11 ENCOUNTER — Ambulatory Visit: Payer: Medicaid Other | Admitting: Podiatry

## 2023-01-11 DIAGNOSIS — L97524 Non-pressure chronic ulcer of other part of left foot with necrosis of bone: Secondary | ICD-10-CM | POA: Diagnosis not present

## 2023-01-11 DIAGNOSIS — Z794 Long term (current) use of insulin: Secondary | ICD-10-CM

## 2023-01-11 DIAGNOSIS — E1142 Type 2 diabetes mellitus with diabetic polyneuropathy: Secondary | ICD-10-CM | POA: Diagnosis not present

## 2023-01-11 NOTE — Progress Notes (Signed)
Subjective:  Patient ID: [name], female    DOB: 1970-09-07,   MRN: [id number]  Chief Complaint  Patient presents with   Wound Check    Ulcer left great toe - discharge and bleeding    52 y.o. female presents for follow-up of left foot wound. Relates shere has been drainage and has been changing daily. Relates it is doing about the same. No worse.  Patient is diabetic and last A1c was  Lab Results  Component Value Date   HGBA1C 14.5 (A) 11/24/2022   . She is following with endocrinology who are working on her blood sugars and getting them under control.   PCP:  Jackie Plum, MD    . Denies any other pedal complaints. Denies n/v/f/c.   Past Medical History:  Diagnosis Date   Anemia    Anxiety    Arthritis    Asthma    Cataract    Mixed form OU   CKD (chronic kidney disease)    Coronary artery disease    Depression    Diabetes mellitus    Diabetic retinopathy (HCC)    NPDR OU   Hyperlipidemia    Hypertension    Hypertensive retinopathy    OU   Left thyroid nodule    diagnosed 07/2018   PAC (premature atrial contraction) 02/15/2021   Pseudotumor cerebri    Sleep apnea    Uses a cpap   Stroke (HCC)    Vitamin D deficiency     Objective:  Physical Exam: Vascular: DP/PT pulses 2/4 bilateral. CFT <3 seconds. Absent hair growth on digits. Edema noted to bilateral lower extremities. Xerosis noted bilaterally.  Skin. No lacerations or abrasions bilateral feet. Nails 1-5 bilateral  are normal in appearance. Dorsal left hallux IPJ ulceration noted about 0.5 cm x 0.8 cm x 0.2 cm with granular base and surrounding slough and hyperkeratosis. Does probe to bone.  Musculoskeletal: MMT 5/5 bilateral lower extremities in DF, PF, Inversion and Eversion. Deceased ROM in DF of ankle joint.  Neurological: Sensation intact to light touch. Protective sensation diminished bilateral.    - ABI is in normal range.    MRI left foot IMPRESSION: 1. Osteomyelitis of the first  proximal phalanx with pathologic fracture. Superficial skin ulceration along the dorsal aspect of the first IP joint with small sinus tract extending to the fracture fragment. No abscess. 2. Mild marrow edema in the first distal phalanx, concerning for early osteomyelitis.  Assessment:   1. 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 Type 2 diabetes mellitus with diabetic polyneuropathy, with long-term current use of insulin (HCC)         Plan:  Patient was evaluated and treated and all questions answered. Ulcer left great toe IPJ dorsum with fat layer exposed  X-ray left foot reviewed. Pathologic fracture noted to medial distal proximal phalanx of right foot possible osseous erosions noted. MRI reviewed and discussed with patient. Discussed prescence of bone infection and need for amputation. Patient is very hesitant but is amenable today and ready to discuss sugery. Discussed amputation of the left hallux.  -Informed surgical risk consent was reviewed and read aloud to the patient.  I reviewed the films.  I have discussed my findings with the patient in great detail.  I have discussed all risks including but not limited to infection, stiffness, scarring, limp, disability, deformity, damage to blood vessels and nerves, numbness, poor healing, need for braces, arthritis, chronic pain, amputation, death.  All benefits and realistic expectations discussed in great detail.  I have made no promises as to the outcome.  I have provided realistic expectations.  I have offered the patient a 2nd opinion, which they have declined and assured me they preferred to proceed despite the risks. Discussed symptoms to look out for and if any worsening to head to the ED.  -Debridement as below. -Dressed with betadine, DSD. -Off-loading with surgical shoe.  -Continue doxycycline.  -ABIs reivewed and normal range.  -Discussed concern for possible bone infection. Patient aware she is at risk  for toe loss if infection deep in bone.  -Discussed glucose control and proper protein-rich diet.  -Discussed if any worsening redness, pain, fever or chills to call or may need to report to the emergency room. Patient expressed understanding.   Procedure: Excisional Debridement of Wound Rationale: Removal of non-viable soft tissue from the wound to promote healing.  Anesthesia: none Pre-Debridement Wound Measurements: Overlying necrotic tissue  Post-Debridement Wound Measurements: 0.5 cm x 0.8 cm x 0.2 cm  Type of Debridement: Sharp Excisional Tissue Removed: Non-viable soft tissue Depth of Debridement: subcutaneous tissue. Technique: Sharp excisional debridement to bleeding, viable wound base.  Dressing: Dry, sterile, compression dressing. Disposition: Patient tolerated procedure well. Patient to return in 2 week for follow-up.  Return if symptoms worsen or fail to improve.   Louann Sjogren, DPM

## 2023-01-13 ENCOUNTER — Telehealth: Payer: Self-pay | Admitting: Urology

## 2023-01-13 ENCOUNTER — Telehealth: Payer: Self-pay

## 2023-01-13 ENCOUNTER — Other Ambulatory Visit (HOSPITAL_COMMUNITY): Payer: Self-pay

## 2023-01-13 NOTE — Telephone Encounter (Signed)
DOS - 01/17/23  AMPUTATION MPJ LEFT HALLUX --- [id number]  [employer]    RECEIVED A FAX FROM [employer] STATING THAT CPT CODE 16109 NO PRIOR AUTH IS REQUIRED.  REF # [id number]

## 2023-01-13 NOTE — Telephone Encounter (Signed)
Patient Advocate Encounter   Received notification from [facility] that prior authorization is required for Dexcom G7 sensor  Per test claim:    PA not required

## 2023-01-17 ENCOUNTER — Other Ambulatory Visit: Payer: Self-pay | Admitting: Podiatry

## 2023-01-17 DIAGNOSIS — L97524 Non-pressure chronic ulcer of other part of left foot with necrosis of bone: Secondary | ICD-10-CM

## 2023-01-17 MED ORDER — OXYCODONE-ACETAMINOPHEN 5-325 MG PO TABS: 1.0000 | ORAL_TABLET | ORAL | 0 refills | Status: AC | PRN

## 2023-01-17 MED ORDER — ONDANSETRON HCL 4 MG PO TABS: 4.0000 mg | ORAL_TABLET | Freq: Three times a day (TID) | ORAL | 0 refills | Status: DC | PRN

## 2023-01-18 ENCOUNTER — Telehealth: Payer: Self-pay | Admitting: Podiatry

## 2023-01-18 NOTE — Telephone Encounter (Signed)
"  I had surgery yesterday.  The prescription she sent to the pharmacy is not covered by my insurance.  I use [facility] on [address]."

## 2023-01-18 NOTE — Telephone Encounter (Signed)
The prescriptions were sent to [facility] on [address]. They did ask for a prior authorization on the oxycodone which should be done and hopefully then insurance will cover.

## 2023-01-20 ENCOUNTER — Ambulatory Visit: Payer: Medicaid Other | Admitting: Cardiology

## 2023-01-20 ENCOUNTER — Encounter: Payer: Self-pay | Admitting: Cardiology

## 2023-01-20 VITALS — BP 150/98 | HR 77 | Ht 69.0 in | Wt 242.2 lb

## 2023-01-20 DIAGNOSIS — I1A0 Resistant hypertension: Secondary | ICD-10-CM

## 2023-01-20 MED ORDER — AMLODIPINE BESYLATE 10 MG PO TABS: 10.0000 mg | ORAL_TABLET | Freq: Every day | ORAL | 3 refills | Status: DC

## 2023-01-20 NOTE — Progress Notes (Signed)
Patient referred by Jackie Plum, MD for hypertensive heart disease  Subjective:   [name], female    DOB: [birth year], 52 y.o.   MRN: [id number]   Chief Complaint  Patient presents with   Hypertension   Follow-up    52 y.o. African-American female with hypertension, hyperlipidemia, uncontrolled type 2 diabetes mellitus, tobacco abuse, recurrent strokes.  Patient underwent LUE AVF placement in anticipation of dialysis. She has close follow up with Dr. Kathrene Bongo for advanced CKD.   She has been out of clonidine for 6 weeks, has not take her blood pressure medications today.    Current Outpatient Medications:    Accu-Chek FastClix Lancets MISC, 1 each by Other route 4 (four) times daily., Disp: 100 each, Rfl: 2   ACCU-CHEK GUIDE test strip, 1 each by Other route 4 (four) times daily. for testing, Disp: 100 each, Rfl: 2   albuterol (VENTOLIN HFA) 108 (90 Base) MCG/ACT inhaler, Inhale 1-2 puffs into the lungs every 6 (six) hours as needed for wheezing or shortness of breath., Disp: , Rfl:    amLODipine (NORVASC) 10 MG tablet, Take 1 tablet (10 mg total) by mouth daily., Disp: 90 tablet, Rfl: 1   aspirin 81 MG EC tablet, Take 1 tablet (81 mg total) by mouth daily., Disp: 90 tablet, Rfl: 2   calcitRIOL (ROCALTROL) 0.25 MCG capsule, Take 0.25 mcg by mouth daily., Disp: , Rfl:    Continuous Blood Gluc Receiver (DEXCOM G7 RECEIVER) DEVI, Use to monitor BG continuously, Disp: 1 each, Rfl: 0   Continuous Glucose Sensor (DEXCOM G7 SENSOR) MISC, Change sensor every 10 days, Disp: 3 each, Rfl: 2   EPINEPHrine 0.3 mg/0.3 mL IJ SOAJ injection, Inject 0.3 mg into the muscle as needed for anaphylaxis., Disp: 2 each, Rfl: 0   fluticasone (FLONASE) 50 MCG/ACT nasal spray, Place 2 sprays into both nostrils daily as needed for allergies or rhinitis., Disp: , Rfl:    furosemide (LASIX) 20 MG tablet, Take 20 mg by mouth 2 (two) times daily., Disp: , Rfl:    glipiZIDE (GLUCOTROL) 10 MG  tablet, Take 10 mg by mouth daily before breakfast., Disp: , Rfl:    insulin aspart protamine - aspart (NOVOLOG 70/30 MIX) (70-30) 100 UNIT/ML FlexPen, Inject 30-40 Units into the skin 2 (two) times daily with a meal., Disp: 30 mL, Rfl: 2   Insulin Syringes, Disposable, U-100 1 ML MISC, 1 application by Does not apply route 2 (two) times a day., Disp: 100 each, Rfl: 0   ipratropium-albuterol (DUONEB) 0.5-2.5 (3) MG/3ML SOLN, Take 3 mLs by nebulization every 4 (four) hours as needed., Disp: 360 mL, Rfl: 0   isosorbide-hydrALAZINE (BIDIL) 20-37.5 MG tablet, Take 1 tablet by mouth 3 (three) times daily. (Patient taking differently: Take 2 tablets by mouth 3 (three) times daily.), Disp: 270 tablet, Rfl: 1   labetalol (NORMODYNE) 300 MG tablet, Take 300 mg by mouth 2 (two) times daily., Disp: , Rfl:    ondansetron (ZOFRAN) 4 MG tablet, Take 1 tablet (4 mg total) by mouth every 8 (eight) hours as needed for nausea or vomiting., Disp: 20 tablet, Rfl: 0   oxyCODONE-acetaminophen (PERCOCET/ROXICET) 5-325 MG tablet, Take 1 tablet by mouth every 4 (four) hours as needed for up to 7 days for severe pain., Disp: 15 tablet, Rfl: 0   rosuvastatin (CRESTOR) 40 MG tablet, Take 40 mg by mouth in the morning., Disp: , Rfl:    blood glucose meter kit and supplies KIT, Dispense based on patient  and insurance preference. Use up to four times daily as directed. (FOR ICD-9 250.00, 250.01)., Disp: 1 each, Rfl: 0   Blood Glucose Monitoring Suppl (ACCU-CHEK GUIDE ME) w/Device KIT, Use to check blood glucose four times daily, Disp: 1 kit, Rfl: 0   cloNIDine (CATAPRES - DOSED IN MG/24 HR) 0.3 mg/24hr patch, Place 1 patch (0.3 mg total) onto the skin once a week. (Patient taking differently: Place 0.3 mg onto the skin every Wednesday.), Disp: 4 patch, Rfl: 12   fenofibrate (TRICOR) 145 MG tablet, Take 145 mg by mouth in the morning. (Patient not taking: Reported on 01/20/2023), Disp: , Rfl:    hydrOXYzine (VISTARIL) 25 MG capsule,  Take 25 mg by mouth at bedtime. (Patient not taking: Reported on 01/20/2023), Disp: , Rfl:     Cardiovascular studies:  EKG 07/20/2022: Sinus rhythm 77 bpm Left atrial enlargement. Diffuse T wave inversion likely due to LV strain  Echocardiogram 07/27/2021:  Left ventricle cavity is normal in size. 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 Max gradient 10 mmHg.    Normal global wall motion. Normal LV systolic function with EF 55%.  Indeterminate diastolic filling pattern.  Structurally normal trileaflet aortic valve. No evidence of aortic  stenosis. Moderate (Grade II) aortic regurgitation.  Mild (Grade I) mitral regurgitation.  Mild tricuspid regurgitation. Estimated pulmonary artery systolic pressure  27 mmHg.  Mild pulmonic regurgitation.  No significant change compared to previous study on 10/16/2019.  Event monitor 09/17/2019 - 10/16/2019: Diagnostic time: 94%  Dominant rhythm: Sinus. HR 66-112 bpm. Avg HR 80 bpm. 6 episodes of NSVT up to 9 beats seen.  No atrial fibrillation/atrial flutter/SVT/high grade AV block, sinus pause >3sec noted. Symptoms reported: None  Recent labs: 06/29/2022: Glucose 597, BUN/Cr 46/4.05. EGFR 13. Na/K 132/4.0. Rest of the CMP normal Chol 194, TG 480, HDL 42, LDL 76 TSH 1.8 normal  04/22/2022: H/H 10/31. MCV 88. Platelets 203 HbA1C 7.9%   Review of Systems  Cardiovascular:  Positive for dyspnea on exertion. Negative for chest pain, leg swelling, palpitations and syncope.  Genitourinary:  Negative for dysuria.  Neurological:  Positive for numbness.       Foot drop  All other systems reviewed and are negative.        Vitals:   01/20/23 0836  BP: (!) 150/98  Pulse: 77  SpO2: 99%     Objective:   Physical Exam Vitals and nursing note reviewed.  Constitutional:      General: She is not in acute distress. HENT:     Head: Normocephalic and atraumatic.  Neck:     Vascular: No JVD.   Cardiovascular:     Rate and Rhythm: Normal rate and regular rhythm.     Pulses: Intact distal pulses.     Heart sounds: Murmur heard.     Early systolic murmur is present with a grade of 2/6 at the lower right sternal border.  Pulmonary:     Effort: Pulmonary effort is normal.     Breath sounds: Normal breath sounds. No rales.  Neurological:     Cranial Nerves: No cranial nerve deficit.           Assessment & Recommendations:   52 y.o. 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 BP elevated today, but has advanced CKD with potential need fo dialysis in future. I would  avoid precipitous drop in BP in favor of renal perfusion. Defer management to nephrology.  Continue current antihypertensive therapy. No change made today.  Given that she has been off clonidine >6 weeks and BP is in 150s/90s, I have not refilled her clonidine today.  Emphasized the importance of compliance  H/o stroke: No Afib on event monitor.  Continue Aspirin, statin.  Type 2 diabetes mellitus: Improving. F/u w/Dr. Fransico Him.  F/u 6 months   Elder Negus, MD Pager: [phone] Office: [phone]

## 2023-01-23 ENCOUNTER — Other Ambulatory Visit: Payer: Self-pay

## 2023-01-23 DIAGNOSIS — E1122 Type 2 diabetes mellitus with diabetic chronic kidney disease: Secondary | ICD-10-CM

## 2023-01-23 MED ORDER — INSULIN PEN NEEDLE 32G X 4 MM MISC
2 refills | Status: AC
Start: 2023-01-23 — End: ?

## 2023-01-24 ENCOUNTER — Encounter: Payer: Self-pay | Admitting: Podiatry

## 2023-01-24 ENCOUNTER — Ambulatory Visit (INDEPENDENT_AMBULATORY_CARE_PROVIDER_SITE_OTHER): Payer: Medicaid Other | Admitting: Podiatry

## 2023-01-24 ENCOUNTER — Ambulatory Visit (INDEPENDENT_AMBULATORY_CARE_PROVIDER_SITE_OTHER): Payer: Medicaid Other

## 2023-01-24 DIAGNOSIS — Z9889 Other specified postprocedural states: Secondary | ICD-10-CM

## 2023-01-24 NOTE — Progress Notes (Signed)
Subjective:  Patient ID: [name], female    DOB: [birth year],  MRN: [id number]  Chief Complaint  Patient presents with   Routine Post Op    POV # 1 DOS 01/17/23 --- LEFT GREAT TOE AMPUTATION  Patient states foot is doing better , states foot has been painful some but not much states she's been trying not to take as much pain medication     DOS: 01/17/23  Procedure: Left great toe amptuation  52 y.o. female returns for POV#1. Relates doing well with minimal pain.   Review of Systems: Negative except as noted in the HPI. Denies N/V/F/Ch.  Past Medical History:  Diagnosis Date   Anemia    Anxiety    Arthritis    Asthma    Cataract    Mixed form OU   CKD (chronic kidney disease)    Coronary artery disease    Depression    Diabetes mellitus    Diabetic retinopathy (HCC)    NPDR OU   Hyperlipidemia    Hypertension    Hypertensive retinopathy    OU   Left thyroid nodule    diagnosed 07/2018   PAC (premature atrial contraction) 02/15/2021   Pseudotumor cerebri    Sleep apnea    Uses a cpap   Stroke (HCC)    Vitamin D deficiency     Current Outpatient Medications:    Accu-Chek FastClix Lancets MISC, 1 each by Other route 4 (four) times daily., Disp: 100 each, Rfl: 2   ACCU-CHEK GUIDE test strip, 1 each by Other route 4 (four) times daily. for testing, Disp: 100 each, Rfl: 2   albuterol (VENTOLIN HFA) 108 (90 Base) MCG/ACT inhaler, Inhale 1-2 puffs into the lungs every 6 (six) hours as needed for wheezing or shortness of breath., Disp: , Rfl:    amLODipine (NORVASC) 10 MG tablet, Take 1 tablet (10 mg total) by mouth daily., Disp: 90 tablet, Rfl: 3   aspirin 81 MG EC tablet, Take 1 tablet (81 mg total) by mouth daily., Disp: 90 tablet, Rfl: 2   blood glucose meter kit and supplies KIT, Dispense based on patient and insurance preference. Use up to four times daily as directed. (FOR ICD-9 250.00, 250.01)., Disp: 1 each, Rfl: 0   Blood Glucose Monitoring Suppl (ACCU-CHEK  GUIDE ME) w/Device KIT, Use to check blood glucose four times daily, Disp: 1 kit, Rfl: 0   calcitRIOL (ROCALTROL) 0.25 MCG capsule, Take 0.25 mcg by mouth daily., Disp: , Rfl:    Continuous Blood Gluc Receiver (DEXCOM G7 RECEIVER) DEVI, Use to monitor BG continuously, Disp: 1 each, Rfl: 0   Continuous Glucose Sensor (DEXCOM G7 SENSOR) MISC, Change sensor every 10 days, Disp: 3 each, Rfl: 2   EPINEPHrine 0.3 mg/0.3 mL IJ SOAJ injection, Inject 0.3 mg into the muscle as needed for anaphylaxis., Disp: 2 each, Rfl: 0   fenofibrate (TRICOR) 145 MG tablet, Take 145 mg by mouth in the morning. (Patient not taking: Reported on 01/20/2023), Disp: , Rfl:    fluticasone (FLONASE) 50 MCG/ACT nasal spray, Place 2 sprays into both nostrils daily as needed for allergies or rhinitis., Disp: , Rfl:    furosemide (LASIX) 20 MG tablet, Take 20 mg by mouth 2 (two) times daily., Disp: , Rfl:    glipiZIDE (GLUCOTROL) 10 MG tablet, Take 10 mg by mouth daily before breakfast., Disp: , Rfl:    hydrOXYzine (VISTARIL) 25 MG capsule, Take 25 mg by mouth at bedtime. (Patient not taking: Reported on  01/20/2023), Disp: , Rfl:    insulin aspart protamine - aspart (NOVOLOG 70/30 MIX) (70-30) 100 UNIT/ML FlexPen, Inject 30-40 Units into the skin 2 (two) times daily with a meal., Disp: 30 mL, Rfl: 2   Insulin Pen Needle 32G X 4 MM MISC, Use to inject insulin twice daily, Disp: 200 each, Rfl: 2   Insulin Syringes, Disposable, U-100 1 ML MISC, 1 application by Does not apply route 2 (two) times a day., Disp: 100 each, Rfl: 0   ipratropium-albuterol (DUONEB) 0.5-2.5 (3) MG/3ML SOLN, Take 3 mLs by nebulization every 4 (four) hours as needed., Disp: 360 mL, Rfl: 0   isosorbide-hydrALAZINE (BIDIL) 20-37.5 MG tablet, Take 1 tablet by mouth 3 (three) times daily. (Patient taking differently: Take 2 tablets by mouth 3 (three) times daily.), Disp: 270 tablet, Rfl: 1   labetalol (NORMODYNE) 300 MG tablet, Take 300 mg by mouth 2 (two) times daily.,  Disp: , Rfl:    ondansetron (ZOFRAN) 4 MG tablet, Take 1 tablet (4 mg total) by mouth every 8 (eight) hours as needed for nausea or vomiting., Disp: 20 tablet, Rfl: 0   oxyCODONE-acetaminophen (PERCOCET/ROXICET) 5-325 MG tablet, Take 1 tablet by mouth every 4 (four) hours as needed for up to 7 days for severe pain., Disp: 15 tablet, Rfl: 0   rosuvastatin (CRESTOR) 40 MG tablet, Take 40 mg by mouth in the morning., Disp: , Rfl:   Social History   Tobacco Use  Smoking Status Former   Packs/day: 0.50   Years: 30.00   Additional pack years: 0.00   Total pack years: 15.00   Types: Cigarettes   Quit date: 2017   Years since quitting: 7.4  Smokeless Tobacco Never    Allergies  Allergen Reactions   Bee Pollen Other (See Comments)    Seasonal Allergies   Dust Mite Extract Cough    Sneezing & Cough   Mixed Ragweed Itching   Objective:  There were no vitals filed for this visit. There is no height or weight on file to calculate BMI. Constitutional Well developed. Well nourished.  Vascular Foot warm and well perfused. Capillary refill normal to all digits.   Neurologic Normal speech. Oriented to person, place, and time. Epicritic sensation to light touch grossly present bilaterally.  Dermatologic Skin healing well without signs of infection. Skin edges well coapted without signs of infection.  Orthopedic: Tenderness to palpation noted about the surgical site.   Radiographs: Interval resection of left hallux  Assessment:   1. Post-operative state    Plan:  Patient was evaluated and treated and all questions answered.  S/p foot surgery left -Progressing as expected post-operatively.  -WB Status: WBAT in surgical shoe -Sutures: intact. -Medications: n/a -Foot redressed.  Follow-up in 2 weeks for suture removal.   No follow-ups on file.

## 2023-01-27 ENCOUNTER — Encounter (HOSPITAL_COMMUNITY): Payer: Medicaid Other

## 2023-02-03 ENCOUNTER — Ambulatory Visit (HOSPITAL_COMMUNITY)
Admission: RE | Admit: 2023-02-03 | Discharge: 2023-02-03 | Disposition: A | Discharge status: Home / Self Care | Payer: Medicaid Other | Source: Ambulatory Visit | Attending: Nephrology | Admitting: Nephrology

## 2023-02-03 VITALS — BP 164/86 | HR 78 | Temp 97.2°F | Resp 17

## 2023-02-03 DIAGNOSIS — N184 Chronic kidney disease, stage 4 (severe): Secondary | ICD-10-CM | POA: Diagnosis present

## 2023-02-03 DIAGNOSIS — N179 Acute kidney failure, unspecified: Secondary | ICD-10-CM | POA: Insufficient documentation

## 2023-02-03 LAB — RENAL FUNCTION PANEL
Albumin: 3.3 g/dL — ABNORMAL LOW (ref 3.5–5.0)
Anion gap: 14 (ref 5–15)
BUN: 53 mg/dL — ABNORMAL HIGH (ref 6–20)
CO2: 21 mmol/L — ABNORMAL LOW (ref 22–32)
Calcium: 7.2 mg/dL — ABNORMAL LOW (ref 8.9–10.3)
Chloride: 103 mmol/L (ref 98–111)
Creatinine, Ser: 5.03 mg/dL — ABNORMAL HIGH (ref 0.44–1.00)
GFR, Estimated: 10 mL/min — ABNORMAL LOW (ref >60)
Glucose, Bld: 171 mg/dL — ABNORMAL HIGH (ref 70–99)
Phosphorus: 5.5 mg/dL — ABNORMAL HIGH (ref 2.5–4.6)
Potassium: 3.3 mmol/L — ABNORMAL LOW (ref 3.5–5.1)
Sodium: 138 mmol/L (ref 135–145)

## 2023-02-03 LAB — IRON AND TIBC
Iron: 44 ug/dL (ref 28–170)
Saturation Ratios: 14 % (ref 10.4–31.8)
TIBC: 309 ug/dL (ref 250–450)
UIBC: 265 ug/dL

## 2023-02-03 LAB — POCT HEMOGLOBIN-HEMACUE: Hemoglobin: 9.6 g/dL — ABNORMAL LOW (ref 12.0–15.0)

## 2023-02-03 LAB — FERRITIN: Ferritin: 236 ng/mL (ref 11–307)

## 2023-02-03 MED ORDER — EPOETIN ALFA-EPBX 10000 UNIT/ML IJ SOLN
10000.0000 [IU] | INTRAMUSCULAR | Status: DC
  Administered 2023-02-03: 10000 [IU] via SUBCUTANEOUS

## 2023-02-03 MED ORDER — EPOETIN ALFA-EPBX 10000 UNIT/ML IJ SOLN
INTRAMUSCULAR | Status: AC
  Filled 2023-02-03: qty 1

## 2023-02-07 ENCOUNTER — Encounter: Payer: Medicaid Other | Admitting: Podiatry

## 2023-02-27 ENCOUNTER — Encounter: Payer: Self-pay | Admitting: Podiatry

## 2023-02-27 ENCOUNTER — Ambulatory Visit: Payer: Medicaid Other | Admitting: Podiatry

## 2023-02-27 DIAGNOSIS — M2041 Other hammer toe(s) (acquired), right foot: Secondary | ICD-10-CM | POA: Diagnosis not present

## 2023-02-27 DIAGNOSIS — Z9889 Other specified postprocedural states: Secondary | ICD-10-CM

## 2023-02-27 NOTE — Progress Notes (Signed)
Subjective:  Patient ID: [name], female    DOB: 07-27-1971,  MRN: [id number]  No chief complaint on file.   DOS: 01/17/23  Procedure: Left great toe amptuation  52 y.o. female returns for POV#2. Relates doing well with minimal pain.   Review of Systems: Negative except as noted in the HPI. Denies N/V/F/Ch.  Past Medical History:  Diagnosis Date   Anemia    Anxiety    Arthritis    Asthma    Cataract    Mixed form OU   CKD (chronic kidney disease)    Coronary artery disease    Depression    Diabetes mellitus    Diabetic retinopathy (HCC)    NPDR OU   Hyperlipidemia    Hypertension    Hypertensive retinopathy    OU   Left thyroid nodule    diagnosed 07/2018   PAC (premature atrial contraction) 02/15/2021   Pseudotumor cerebri    Sleep apnea    Uses a cpap   Stroke (HCC)    Vitamin D deficiency     Current Outpatient Medications:    Accu-Chek FastClix Lancets MISC, 1 each by Other route 4 (four) times daily., Disp: 100 each, Rfl: 2   ACCU-CHEK GUIDE test strip, 1 each by Other route 4 (four) times daily. for testing, Disp: 100 each, Rfl: 2   albuterol (VENTOLIN HFA) 108 (90 Base) MCG/ACT inhaler, Inhale 1-2 puffs into the lungs every 6 (six) hours as needed for wheezing or shortness of breath., Disp: , Rfl:    amLODipine (NORVASC) 10 MG tablet, Take 1 tablet (10 mg total) by mouth daily., Disp: 90 tablet, Rfl: 3   aspirin 81 MG EC tablet, Take 1 tablet (81 mg total) by mouth daily., Disp: 90 tablet, Rfl: 2   blood glucose meter kit and supplies KIT, Dispense based on patient and insurance preference. Use up to four times daily as directed. (FOR ICD-9 250.00, 250.01)., Disp: 1 each, Rfl: 0   Blood Glucose Monitoring Suppl (ACCU-CHEK GUIDE ME) w/Device KIT, Use to check blood glucose four times daily, Disp: 1 kit, Rfl: 0   calcitRIOL (ROCALTROL) 0.25 MCG capsule, Take 0.25 mcg by mouth daily., Disp: , Rfl:    Continuous Blood Gluc Receiver (DEXCOM G7 RECEIVER)  DEVI, Use to monitor BG continuously, Disp: 1 each, Rfl: 0   Continuous Glucose Sensor (DEXCOM G7 SENSOR) MISC, Change sensor every 10 days, Disp: 3 each, Rfl: 2   EPINEPHrine 0.3 mg/0.3 mL IJ SOAJ injection, Inject 0.3 mg into the muscle as needed for anaphylaxis., Disp: 2 each, Rfl: 0   fenofibrate (TRICOR) 145 MG tablet, Take 145 mg by mouth in the morning. (Patient not taking: Reported on 01/20/2023), Disp: , Rfl:    fluticasone (FLONASE) 50 MCG/ACT nasal spray, Place 2 sprays into both nostrils daily as needed for allergies or rhinitis., Disp: , Rfl:    furosemide (LASIX) 20 MG tablet, Take 20 mg by mouth 2 (two) times daily., Disp: , Rfl:    glipiZIDE (GLUCOTROL) 10 MG tablet, Take 10 mg by mouth daily before breakfast., Disp: , Rfl:    hydrOXYzine (VISTARIL) 25 MG capsule, Take 25 mg by mouth at bedtime. (Patient not taking: Reported on 01/20/2023), Disp: , Rfl:    insulin aspart protamine - aspart (NOVOLOG 70/30 MIX) (70-30) 100 UNIT/ML FlexPen, Inject 30-40 Units into the skin 2 (two) times daily with a meal., Disp: 30 mL, Rfl: 2   Insulin Pen Needle 32G X 4 MM MISC, Use to  inject insulin twice daily, Disp: 200 each, Rfl: 2   Insulin Syringes, Disposable, U-100 1 ML MISC, 1 application by Does not apply route 2 (two) times a day., Disp: 100 each, Rfl: 0   ipratropium-albuterol (DUONEB) 0.5-2.5 (3) MG/3ML SOLN, Take 3 mLs by nebulization every 4 (four) hours as needed., Disp: 360 mL, Rfl: 0   isosorbide-hydrALAZINE (BIDIL) 20-37.5 MG tablet, Take 1 tablet by mouth 3 (three) times daily. (Patient taking differently: Take 2 tablets by mouth 3 (three) times daily.), Disp: 270 tablet, Rfl: 1   labetalol (NORMODYNE) 300 MG tablet, Take 300 mg by mouth 2 (two) times daily., Disp: , Rfl:    ondansetron (ZOFRAN) 4 MG tablet, Take 1 tablet (4 mg total) by mouth every 8 (eight) hours as needed for nausea or vomiting., Disp: 20 tablet, Rfl: 0   rosuvastatin (CRESTOR) 40 MG tablet, Take 40 mg by mouth in  the morning., Disp: , Rfl:   Social History   Tobacco Use  Smoking Status Former   Current packs/day: 0.00   Average packs/day: 0.5 packs/day for 30.0 years (15.0 ttl pk-yrs)   Types: Cigarettes   Start date: 44   Quit date: 2017   Years since quitting: 7.5  Smokeless Tobacco Never    Allergies  Allergen Reactions   Bee Pollen Other (See Comments)    Seasonal Allergies   Dust Mite Extract Cough    Sneezing & Cough   Mixed Ragweed Itching   Objective:  There were no vitals filed for this visit. There is no height or weight on file to calculate BMI. Constitutional Well developed. Well nourished.  Vascular Foot warm and well perfused. Capillary refill normal to all digits.   Neurologic Normal speech. Oriented to person, place, and time. Epicritic sensation to light touch grossly present bilaterally.  Dermatologic Skin healing well without signs of infection. Skin edges well coapted without signs of infection.  Orthopedic: Tenderness to palpation noted about the surgical site.   Radiographs: Interval resection of left hallux  Assessment:   1. Post-operative state    Plan:  Patient was evaluated and treated and all questions answered.  S/p foot surgery left -Progressing as expected post-operatively. -WB Status: WBAT in regular shoes.  -Will work on getting DM shoes as well  -Sutures: removed without incident -Medications: n/a -Foot redressed.  Follow-up in 3 weeks for recheck.   No follow-ups on file.

## 2023-03-03 ENCOUNTER — Ambulatory Visit (HOSPITAL_COMMUNITY)
Admission: RE | Admit: 2023-03-03 | Discharge: 2023-03-03 | Disposition: A | Discharge status: Home / Self Care | Payer: Medicaid Other | Source: Ambulatory Visit | Attending: Nephrology | Admitting: Nephrology

## 2023-03-03 VITALS — BP 139/80 | HR 85 | Temp 97.3°F | Resp 17

## 2023-03-03 DIAGNOSIS — N184 Chronic kidney disease, stage 4 (severe): Secondary | ICD-10-CM | POA: Insufficient documentation

## 2023-03-03 DIAGNOSIS — N179 Acute kidney failure, unspecified: Secondary | ICD-10-CM | POA: Insufficient documentation

## 2023-03-03 LAB — RENAL FUNCTION PANEL
Albumin: 3.2 g/dL — ABNORMAL LOW (ref 3.5–5.0)
Anion gap: 17 — ABNORMAL HIGH (ref 5–15)
BUN: 54 mg/dL — ABNORMAL HIGH (ref 6–20)
CO2: 22 mmol/L (ref 22–32)
Calcium: 7.4 mg/dL — ABNORMAL LOW (ref 8.9–10.3)
Chloride: 101 mmol/L (ref 98–111)
Creatinine, Ser: 4.56 mg/dL — ABNORMAL HIGH (ref 0.44–1.00)
GFR, Estimated: 11 mL/min — ABNORMAL LOW (ref >60)
Glucose, Bld: 170 mg/dL — ABNORMAL HIGH (ref 70–99)
Phosphorus: 4.8 mg/dL — ABNORMAL HIGH (ref 2.5–4.6)
Potassium: 3.5 mmol/L (ref 3.5–5.1)
Sodium: 140 mmol/L (ref 135–145)

## 2023-03-03 LAB — IRON AND TIBC
Iron: 56 ug/dL (ref 28–170)
Saturation Ratios: 19 % (ref 10.4–31.8)
TIBC: 302 ug/dL (ref 250–450)
UIBC: 246 ug/dL

## 2023-03-03 LAB — FERRITIN: Ferritin: 183 ng/mL (ref 11–307)

## 2023-03-03 LAB — POCT HEMOGLOBIN-HEMACUE: Hemoglobin: 9 g/dL — ABNORMAL LOW (ref 12.0–15.0)

## 2023-03-03 MED ORDER — EPOETIN ALFA-EPBX 10000 UNIT/ML IJ SOLN
INTRAMUSCULAR | Status: AC
  Administered 2023-03-03: 10000 [IU] via SUBCUTANEOUS
  Filled 2023-03-03: qty 1

## 2023-03-03 MED ORDER — EPOETIN ALFA-EPBX 10000 UNIT/ML IJ SOLN: 10000.0000 [IU] | INTRAMUSCULAR | Status: DC

## 2023-03-08 ENCOUNTER — Other Ambulatory Visit: Payer: Self-pay | Admitting: *Deleted

## 2023-03-08 DIAGNOSIS — N186 End stage renal disease: Secondary | ICD-10-CM

## 2023-03-09 ENCOUNTER — Ambulatory Visit (INDEPENDENT_AMBULATORY_CARE_PROVIDER_SITE_OTHER)
Admission: RE | Admit: 2023-03-09 | Discharge: 2023-03-09 | Disposition: A | Discharge status: Home / Self Care | Payer: Medicaid Other | Source: Ambulatory Visit | Attending: Vascular Surgery | Admitting: Vascular Surgery

## 2023-03-09 ENCOUNTER — Ambulatory Visit (HOSPITAL_COMMUNITY)
Admission: RE | Admit: 2023-03-09 | Discharge: 2023-03-09 | Disposition: A | Discharge status: Home / Self Care | Payer: Medicaid Other | Source: Ambulatory Visit | Attending: Vascular Surgery | Admitting: Vascular Surgery

## 2023-03-09 DIAGNOSIS — N186 End stage renal disease: Secondary | ICD-10-CM

## 2023-03-14 NOTE — Progress Notes (Unsigned)
Office Note     CC: CKD 5 Requesting Provider:  Jackie Plum, MD  HPI: KINSLEA EWERS is a Right handed 52 y.o. (Sep 18, 1970) female with kidney disease who presents at the request of Osei-Bonsu, Greggory Stallion, MD for permanent HD access. The patient has had one prior access procedure -left brachiocephalic fistula which failed to mature.  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  A native of [place], she moved down to [place] for change of scenery. She had no questions regarding access after previously having undergone left-sided radiocephalic fistula.  Past surgical history includes left great toe amputation for osteomyelitis which has healed.   Past Medical History:  Diagnosis Date   Anemia    Anxiety    Arthritis    Asthma    Cataract    Mixed form OU   CKD (chronic kidney disease)    Coronary artery disease    Depression    Diabetes mellitus    Diabetic retinopathy (HCC)    NPDR OU   Hyperlipidemia    Hypertension    Hypertensive retinopathy    OU   Left thyroid nodule    diagnosed 07/2018   PAC (premature atrial contraction) 02/15/2021   Pseudotumor cerebri    Sleep apnea    Uses a cpap   Stroke (HCC)    Vitamin D deficiency     Past Surgical History:  Procedure Laterality Date   ACHILLES TENDON REPAIR Right    ACHILLES TENDON SURGERY Left 02/19/2021   Procedure: ACHILLES TENDON REPAIR WITH GRAFT;  Surgeon: Candelaria Stagers, DPM;  Location: [facility];  Service: Podiatry;  Laterality: Left;  BLOCK   AMPUTATION TOE Right 05/28/2021   Procedure: AMPUTATION TOE;  Surgeon: Louann Sjogren, MD;  Location: [facility] OR;  Service: Podiatry;  Laterality: Right;  Surgical team will do block   AV FISTULA PLACEMENT Left 07/29/2022   Procedure: LEFT ARTERIOVENOUS (AV) FISTULA CREATION;  Surgeon: Maeola Harman, MD;  Location: [facility] OR;  Service: Vascular;  Laterality: Left;   GASTROC RECESSION EXTREMITY Left 02/19/2021    Procedure: GASTROC RECESSION EXTREMITY;  Surgeon: Candelaria Stagers, DPM;  Location: [facility];  Service: Podiatry;  Laterality: Left;  Block   TOE SURGERY Left 01/2023   TUBAL LIGATION      Social History   Socioeconomic History   Marital status: Single    Spouse name: Not on file   Number of children: 4   Years of education: Not on file   Highest education level: Not on file  Occupational History   Occupation: unemployed  Tobacco Use   Smoking status: Former    Current packs/day: 0.00    Average packs/day: 0.5 packs/day for 30.0 years (15.0 ttl pk-yrs)    Types: Cigarettes    Start date: 74    Quit date: 2017    Years since quitting: 7.6   Smokeless tobacco: Never  Vaping Use   Vaping status: Never Used  Substance and Sexual Activity   Alcohol use: Not Currently    Alcohol/week: 0.0 standard drinks of alcohol    Comment: rare   Drug use: Yes    Types: Marijuana    Comment: occasional   Sexual activity: Not Currently    Partners: Male    Birth control/protection: Surgical  Other Topics Concern   Not on file  Social History Narrative   Lives with 3 kids   Right handed   Drinks 2 cups  caffeine daily   Social Determinants of Health   Financial Resource Strain: Not on file  Food Insecurity: Patient Declined (04/20/2022)   Hunger Vital Sign    Worried About Running Out of Food in the Last Year: Patient declined    Ran Out of Food in the Last Year: Patient declined  Transportation Needs: Patient Declined (04/20/2022)   PRAPARE - Administrator, Civil Service (Medical): Patient declined    Lack of Transportation (Non-Medical): Patient declined  Physical Activity: Inactive (02/15/2021)   Exercise Vital Sign    Days of Exercise per Week: 0 days    Minutes of Exercise per Session: 0 min  Stress: Stress Concern Present (02/15/2021)   [employer] of Occupational Health - Occupational Stress Questionnaire    Feeling of Stress : Very much   Social Connections: Moderately Isolated (02/15/2021)   Social Connection and Isolation Panel [NHANES]    Frequency of Communication with Friends and Family: Three times a week    Frequency of Social Gatherings with Friends and Family: Three times a week    Attends Religious Services: More than 4 times per year    Active Member of Clubs or Organizations: No    Attends Banker Meetings: Never    Marital Status: Never married  Intimate Partner Violence: Unknown (04/20/2022)   Humiliation, Afraid, Rape, and Kick questionnaire    Fear of Current or Ex-Partner: Patient declined    Emotionally Abused: Patient declined    Physically Abused: Not on file    Sexually Abused: Not on file   Family History  Problem Relation Age of Onset   Asthma Mother    Diabetes Father    Hypertension Father    Kidney disease Father    Heart attack Father    Heart failure Father    Hypertension Sister    Asthma Sister    Congestive Heart Failure Maternal Aunt    Diabetes Maternal Grandmother    Congestive Heart Failure Maternal Grandmother    Lung disease Maternal Grandmother    Asthma Other    Hyperlipidemia Other    Hypertension Other    Cancer Other     Current Outpatient Medications  Medication Sig Dispense Refill   Accu-Chek FastClix Lancets MISC 1 each by Other route 4 (four) times daily. 100 each 2   ACCU-CHEK GUIDE test strip 1 each by Other route 4 (four) times daily. for testing 100 each 2   albuterol (VENTOLIN HFA) 108 (90 Base) MCG/ACT inhaler Inhale 1-2 puffs into the lungs every 6 (six) hours as needed for wheezing or shortness of breath.     amLODipine (NORVASC) 10 MG tablet Take 1 tablet (10 mg total) by mouth daily. 90 tablet 3   aspirin 81 MG EC tablet Take 1 tablet (81 mg total) by mouth daily. 90 tablet 2   blood glucose meter kit and supplies KIT Dispense based on patient and insurance preference. Use up to four times daily as directed. (FOR ICD-9 250.00, 250.01). 1  each 0   Blood Glucose Monitoring Suppl (ACCU-CHEK GUIDE ME) w/Device KIT Use to check blood glucose four times daily 1 kit 0   calcitRIOL (ROCALTROL) 0.25 MCG capsule Take 0.25 mcg by mouth daily.     Continuous Blood Gluc Receiver (DEXCOM G7 RECEIVER) DEVI Use to monitor BG continuously 1 each 0   Continuous Glucose Sensor (DEXCOM G7 SENSOR) MISC Change sensor every 10 days 3 each 2   EPINEPHrine 0.3 mg/0.3 mL IJ SOAJ  injection Inject 0.3 mg into the muscle as needed for anaphylaxis. 2 each 0   fenofibrate (TRICOR) 145 MG tablet Take 145 mg by mouth in the morning. (Patient not taking: Reported on 01/20/2023)     fluticasone (FLONASE) 50 MCG/ACT nasal spray Place 2 sprays into both nostrils daily as needed for allergies or rhinitis.     furosemide (LASIX) 20 MG tablet Take 20 mg by mouth 2 (two) times daily.     glipiZIDE (GLUCOTROL) 10 MG tablet Take 10 mg by mouth daily before breakfast.     hydrOXYzine (VISTARIL) 25 MG capsule Take 25 mg by mouth at bedtime. (Patient not taking: Reported on 01/20/2023)     insulin aspart protamine - aspart (NOVOLOG 70/30 MIX) (70-30) 100 UNIT/ML FlexPen Inject 30-40 Units into the skin 2 (two) times daily with a meal. 30 mL 2   Insulin Pen Needle 32G X 4 MM MISC Use to inject insulin twice daily 200 each 2   Insulin Syringes, Disposable, U-100 1 ML MISC 1 application by Does not apply route 2 (two) times a day. 100 each 0   ipratropium-albuterol (DUONEB) 0.5-2.5 (3) MG/3ML SOLN Take 3 mLs by nebulization every 4 (four) hours as needed. 360 mL 0   isosorbide-hydrALAZINE (BIDIL) 20-37.5 MG tablet Take 1 tablet by mouth 3 (three) times daily. (Patient taking differently: Take 2 tablets by mouth 3 (three) times daily.) 270 tablet 1   labetalol (NORMODYNE) 300 MG tablet Take 300 mg by mouth 2 (two) times daily.     ondansetron (ZOFRAN) 4 MG tablet Take 1 tablet (4 mg total) by mouth every 8 (eight) hours as needed for nausea or vomiting. 20 tablet 0   rosuvastatin  (CRESTOR) 40 MG tablet Take 40 mg by mouth in the morning.     No current facility-administered medications for this visit.    Allergies  Allergen Reactions   Bee Pollen Other (See Comments)    Seasonal Allergies   Dust Mite Extract Cough    Sneezing & Cough   Mixed Ragweed Itching     REVIEW OF SYSTEMS:  [X]  denotes positive finding, [ ]  denotes negative finding Cardiac  Comments:  Chest pain or chest pressure:    Shortness of breath upon exertion:    Short of breath when lying flat:    Irregular heart rhythm:        Vascular    Pain in calf, thigh, or hip brought on by ambulation:    Pain in feet at night that wakes you up from your sleep:     Blood clot in your veins:    Leg swelling:         Pulmonary    Oxygen at home:    Productive cough:     Wheezing:         Neurologic    Sudden weakness in arms or legs:     Sudden numbness in arms or legs:     Sudden onset of difficulty speaking or slurred speech:    Temporary loss of vision in one eye:     Problems with dizziness:         Gastrointestinal    Blood in stool:     Vomited blood:         Genitourinary    Burning when urinating:     Blood in urine:        Psychiatric    Major depression:         Hematologic  Bleeding problems:    Problems with blood clotting too easily:        Skin    Rashes or ulcers:        Constitutional    Fever or chills:      PHYSICAL EXAMINATION:  There were no vitals filed for this visit.  General:  WDWN in NAD; vital signs documented above Gait: Not observed HENT: WNL, normocephalic Pulmonary: normal non-labored breathing , without Rales, rhonchi,  wheezing Cardiac: regular H Abdomen: soft, NT, no masses Skin: without rashes Vascular Exam/Pulses:  Right Left  Radial 2+ (normal) 2+ (normal)  Ulnar    Femoral    Popliteal    DP 2+ (normal) 2+ (normal)       Extremities: without ischemic changes, without Gangrene , without cellulitis; without open wounds;   Musculoskeletal: no muscle wasting or atrophy  Neurologic: A&O X 3;  No focal weakness or paresthesias are detected Psychiatric:  The pt has Normal affect.   Non-Invasive Vascular Imaging:   See studies    ASSESSMENT/PLAN:  LORELL IBSEN is a 52 y.o. female who presents with chronic kidney disease stage 5  Based on vein mapping and examination, patient has a suitable cephalic vein in the right upper extremity for access. Plan for right-sided arteriovenous fistula creation, with possible need in the future for superficialization. I had an extensive discussion with this patient in regards to the nature of access surgery, including risk, benefits, and alternatives.   The patient is aware that the risks of access surgery include but are not limited to: bleeding, infection, steal syndrome, nerve damage, ischemic monomelic neuropathy, failure of access to mature, complications related to venous hypertension, and possible need for additional access procedures in the future.  I discussed with the patient the nature of the staged access procedure, specifically the need for a second operation to transpose the first stage fistula if it matures adequately.   The patient has agreed to proceed with the above procedure which will be scheduled at her convenience.  Victorino Sparrow, MD [facility] [phone]

## 2023-03-15 ENCOUNTER — Ambulatory Visit (INDEPENDENT_AMBULATORY_CARE_PROVIDER_SITE_OTHER): Payer: Medicaid Other | Admitting: Vascular Surgery

## 2023-03-15 ENCOUNTER — Encounter: Payer: Self-pay | Admitting: Vascular Surgery

## 2023-03-15 VITALS — BP 206/127 | HR 83 | Temp 98.2°F | Resp 20 | Ht 69.0 in | Wt 249.0 lb

## 2023-03-15 DIAGNOSIS — N185 Chronic kidney disease, stage 5: Secondary | ICD-10-CM

## 2023-03-21 ENCOUNTER — Ambulatory Visit: Payer: Medicaid Other | Admitting: Podiatry

## 2023-03-21 ENCOUNTER — Encounter: Payer: Self-pay | Admitting: Podiatry

## 2023-03-21 DIAGNOSIS — M2041 Other hammer toe(s) (acquired), right foot: Secondary | ICD-10-CM | POA: Diagnosis not present

## 2023-03-21 DIAGNOSIS — M2042 Other hammer toe(s) (acquired), left foot: Secondary | ICD-10-CM | POA: Diagnosis not present

## 2023-03-21 DIAGNOSIS — E1142 Type 2 diabetes mellitus with diabetic polyneuropathy: Secondary | ICD-10-CM | POA: Diagnosis not present

## 2023-03-21 DIAGNOSIS — Z9889 Other specified postprocedural states: Secondary | ICD-10-CM

## 2023-03-21 DIAGNOSIS — Z89411 Acquired absence of right great toe: Secondary | ICD-10-CM

## 2023-03-21 NOTE — Addendum Note (Signed)
Addended by: Louann Sjogren R on: 03/21/2023 08:11 AM   Modules accepted: Level of Service

## 2023-03-21 NOTE — Progress Notes (Signed)
Subjective:  Patient ID: [name], female    DOB: Sep 20, 1970,  MRN: [id number]  No chief complaint on file.   DOS: 01/17/23  Procedure: Left great toe amptuation  52 y.o. female returns for POV#3. Relates doing well with minimal pain.   Review of Systems: Negative except as noted in the HPI. Denies N/V/F/Ch.  Past Medical History:  Diagnosis Date   Anemia    Anxiety    Arthritis    Asthma    Cataract    Mixed form OU   CKD (chronic kidney disease)    Coronary artery disease    Depression    Diabetes mellitus    Diabetic retinopathy (HCC)    NPDR OU   Hyperlipidemia    Hypertension    Hypertensive retinopathy    OU   Left thyroid nodule    diagnosed 07/2018   PAC (premature atrial contraction) 02/15/2021   Pseudotumor cerebri    Sleep apnea    Uses a cpap   Stroke (HCC)    Vitamin D deficiency     Current Outpatient Medications:    Accu-Chek FastClix Lancets MISC, 1 each by Other route 4 (four) times daily., Disp: 100 each, Rfl: 2   ACCU-CHEK GUIDE test strip, 1 each by Other route 4 (four) times daily. for testing, Disp: 100 each, Rfl: 2   albuterol (VENTOLIN HFA) 108 (90 Base) MCG/ACT inhaler, Inhale 1-2 puffs into the lungs every 6 (six) hours as needed for wheezing or shortness of breath., Disp: , Rfl:    amLODipine (NORVASC) 10 MG tablet, Take 1 tablet (10 mg total) by mouth daily., Disp: 90 tablet, Rfl: 3   aspirin 81 MG EC tablet, Take 1 tablet (81 mg total) by mouth daily., Disp: 90 tablet, Rfl: 2   blood glucose meter kit and supplies KIT, Dispense based on patient and insurance preference. Use up to four times daily as directed. (FOR ICD-9 250.00, 250.01)., Disp: 1 each, Rfl: 0   Blood Glucose Monitoring Suppl (ACCU-CHEK GUIDE ME) w/Device KIT, Use to check blood glucose four times daily, Disp: 1 kit, Rfl: 0   calcitRIOL (ROCALTROL) 0.25 MCG capsule, Take 0.25 mcg by mouth daily., Disp: , Rfl:    Continuous Blood Gluc Receiver (DEXCOM G7 RECEIVER)  DEVI, Use to monitor BG continuously, Disp: 1 each, Rfl: 0   Continuous Glucose Sensor (DEXCOM G7 SENSOR) MISC, Change sensor every 10 days, Disp: 3 each, Rfl: 2   EPINEPHrine 0.3 mg/0.3 mL IJ SOAJ injection, Inject 0.3 mg into the muscle as needed for anaphylaxis., Disp: 2 each, Rfl: 0   fenofibrate (TRICOR) 145 MG tablet, Take 145 mg by mouth in the morning., Disp: , Rfl:    fluticasone (FLONASE) 50 MCG/ACT nasal spray, Place 2 sprays into both nostrils daily as needed for allergies or rhinitis., Disp: , Rfl:    furosemide (LASIX) 20 MG tablet, Take 20 mg by mouth 2 (two) times daily., Disp: , Rfl:    glipiZIDE (GLUCOTROL) 10 MG tablet, Take 10 mg by mouth daily before breakfast., Disp: , Rfl:    hydrOXYzine (VISTARIL) 25 MG capsule, Take 25 mg by mouth at bedtime., Disp: , Rfl:    insulin aspart protamine - aspart (NOVOLOG 70/30 MIX) (70-30) 100 UNIT/ML FlexPen, Inject 30-40 Units into the skin 2 (two) times daily with a meal., Disp: 30 mL, Rfl: 2   Insulin Pen Needle 32G X 4 MM MISC, Use to inject insulin twice daily, Disp: 200 each, Rfl: 2   Insulin  Syringes, Disposable, U-100 1 ML MISC, 1 application by Does not apply route 2 (two) times a day., Disp: 100 each, Rfl: 0   ipratropium-albuterol (DUONEB) 0.5-2.5 (3) MG/3ML SOLN, Take 3 mLs by nebulization every 4 (four) hours as needed., Disp: 360 mL, Rfl: 0   isosorbide-hydrALAZINE (BIDIL) 20-37.5 MG tablet, Take 1 tablet by mouth 3 (three) times daily. (Patient taking differently: Take 2 tablets by mouth 3 (three) times daily.), Disp: 270 tablet, Rfl: 1   labetalol (NORMODYNE) 300 MG tablet, Take 300 mg by mouth 2 (two) times daily., Disp: , Rfl:    ondansetron (ZOFRAN) 4 MG tablet, Take 1 tablet (4 mg total) by mouth every 8 (eight) hours as needed for nausea or vomiting., Disp: 20 tablet, Rfl: 0   rosuvastatin (CRESTOR) 40 MG tablet, Take 40 mg by mouth in the morning., Disp: , Rfl:   Social History   Tobacco Use  Smoking Status Former    Current packs/day: 0.00   Average packs/day: 0.5 packs/day for 30.0 years (15.0 ttl pk-yrs)   Types: Cigarettes   Start date: 89   Quit date: 2017   Years since quitting: 7.6  Smokeless Tobacco Never    Allergies  Allergen Reactions   Bee Pollen Other (See Comments)    Seasonal Allergies   Dust Mite Extract Cough    Sneezing & Cough   Mixed Ragweed Itching   Objective:  There were no vitals filed for this visit. There is no height or weight on file to calculate BMI. Constitutional Well developed. Well nourished.  Vascular Foot warm and well perfused. Capillary refill normal to all digits.   Neurologic Normal speech. Oriented to person, place, and time. Epicritic sensation to light touch grossly present bilaterally.  Dermatologic Skin healing well without signs of infection. Skin edges well coapted without signs of infection.  Orthopedic: Tenderness to palpation noted about the surgical site.   Radiographs: Interval resection of left hallux  Assessment:   1. Post-operative state    Plan:  Patient was evaluated and treated and all questions answered.  S/p foot surgery left -Progressing as expected post-operatively. -WB Status: WBAT in regular shoes.  -Awaiting DM shoes   -Medications: n/a   Follow-up in 3 months for rfc.   No follow-ups on file.

## 2023-03-23 ENCOUNTER — Telehealth: Payer: Self-pay

## 2023-03-23 ENCOUNTER — Other Ambulatory Visit: Payer: Self-pay

## 2023-03-23 ENCOUNTER — Ambulatory Visit: Payer: Medicaid Other | Admitting: "Endocrinology

## 2023-03-23 ENCOUNTER — Ambulatory Visit: Payer: Medicaid Other

## 2023-03-23 DIAGNOSIS — Z794 Long term (current) use of insulin: Secondary | ICD-10-CM

## 2023-03-23 DIAGNOSIS — M2041 Other hammer toe(s) (acquired), right foot: Secondary | ICD-10-CM

## 2023-03-23 DIAGNOSIS — N185 Chronic kidney disease, stage 5: Secondary | ICD-10-CM

## 2023-03-23 NOTE — Progress Notes (Signed)
Patient presents to the office today for diabetic shoe and insole measuring.  Patient was measured with brannock device to determine size and width for 1 pair of extra depth shoes and foam casted for 3ea (RT) custom inserts & Left Toe filler  Documentation of medical necessity will be sent to patient's treating diabetic doctor to verify and sign.   [name] diabetic provider: Jackie Plum  Shoes and insoles will be ordered at that time and patient will be notified for an appointment for fitting when they arrive.   Shoe size (per patient): 11 Brannock measurement: 10W Patient shoe selection- Shoe choice:   [id number] NBAL 2nd choice [id number] Shoe size ordered: 10.5WD  Financial form signed

## 2023-03-23 NOTE — Telephone Encounter (Signed)
Attempted to reach patient to schedule right arm fistula creation. Left VM for patient to return call.

## 2023-03-23 NOTE — Telephone Encounter (Signed)
Patient returned call. Surgery scheduled for patient's next available time on 8/27. Instructions provided and she voiced understanding.

## 2023-03-28 ENCOUNTER — Encounter: Payer: Self-pay | Admitting: "Endocrinology

## 2023-03-28 ENCOUNTER — Telehealth: Payer: Self-pay

## 2023-03-28 ENCOUNTER — Other Ambulatory Visit (HOSPITAL_COMMUNITY): Payer: Self-pay

## 2023-03-28 ENCOUNTER — Ambulatory Visit: Payer: Medicaid Other | Admitting: "Endocrinology

## 2023-03-28 VITALS — BP 124/68 | HR 84 | Ht 69.0 in | Wt 249.2 lb

## 2023-03-28 DIAGNOSIS — E782 Mixed hyperlipidemia: Secondary | ICD-10-CM

## 2023-03-28 DIAGNOSIS — E1122 Type 2 diabetes mellitus with diabetic chronic kidney disease: Secondary | ICD-10-CM

## 2023-03-28 DIAGNOSIS — Z6835 Body mass index (BMI) 35.0-35.9, adult: Secondary | ICD-10-CM

## 2023-03-28 DIAGNOSIS — I10 Essential (primary) hypertension: Secondary | ICD-10-CM

## 2023-03-28 DIAGNOSIS — N184 Chronic kidney disease, stage 4 (severe): Secondary | ICD-10-CM | POA: Diagnosis not present

## 2023-03-28 DIAGNOSIS — Z794 Long term (current) use of insulin: Secondary | ICD-10-CM

## 2023-03-28 LAB — POCT GLYCOSYLATED HEMOGLOBIN (HGB A1C): HbA1c, POC (controlled diabetic range): 7.4 % — AB (ref 0.0–7.0)

## 2023-03-28 MED ORDER — OZEMPIC (0.25 OR 0.5 MG/DOSE) 2 MG/3ML ~~LOC~~ SOPN: 0.2500 mg | PEN_INJECTOR | SUBCUTANEOUS | 0 refills | Status: DC

## 2023-03-28 NOTE — Telephone Encounter (Signed)
Pharmacy Patient Advocate Encounter   Received notification from CoverMyMeds that prior authorization for Ozempic is required/requested.   Insurance verification completed.   The patient is insured through [facility] .   Per test claim: PA required; PA submitted to [facility] via CoverMyMeds Key/confirmation #/EOC [id number] Status is pending

## 2023-03-28 NOTE — Telephone Encounter (Signed)
Pharmacy Patient Advocate Encounter  Received notification from [facility] that Prior Authorization for Ozempic has been APPROVED until 03/27/2024   PA #/Case ID/Reference #: [id number]

## 2023-03-28 NOTE — Patient Instructions (Signed)

## 2023-03-28 NOTE — Progress Notes (Signed)
03/28/2023, 12:23 PM  Endocrinology follow-up note   Subjective:    Patient ID: [name], female    DOB: 29-Apr-1971.  [name] is being seen in follow-up after she was seen in consultation for management of currently uncontrolled symptomatic diabetes requested by  Jackie Plum, MD.    Past Medical History:  Diagnosis Date   Anemia    Anxiety    Arthritis    Asthma    Cataract    Mixed form OU   CKD (chronic kidney disease)    Coronary artery disease    Depression    Diabetes mellitus    Diabetic retinopathy (HCC)    NPDR OU   Hyperlipidemia    Hypertension    Hypertensive retinopathy    OU   Left thyroid nodule    diagnosed 07/2018   PAC (premature atrial contraction) 02/15/2021   Pseudotumor cerebri    Sleep apnea    Uses a cpap   Stroke (HCC)    Vitamin D deficiency     Past Surgical History:  Procedure Laterality Date   ACHILLES TENDON REPAIR Right    ACHILLES TENDON SURGERY Left 02/19/2021   Procedure: ACHILLES TENDON REPAIR WITH GRAFT;  Surgeon: Candelaria Stagers, DPM;  Location: [facility];  Service: Podiatry;  Laterality: Left;  BLOCK   AMPUTATION TOE Right 05/28/2021   Procedure: AMPUTATION TOE;  Surgeon: Louann Sjogren, MD;  Location: [facility] OR;  Service: Podiatry;  Laterality: Right;  Surgical team will do block   AV FISTULA PLACEMENT Left 07/29/2022   Procedure: LEFT ARTERIOVENOUS (AV) FISTULA CREATION;  Surgeon: Maeola Harman, MD;  Location: [facility] OR;  Service: Vascular;  Laterality: Left;   GASTROC RECESSION EXTREMITY Left 02/19/2021   Procedure: GASTROC RECESSION EXTREMITY;  Surgeon: Candelaria Stagers, DPM;  Location: [facility];  Service: Podiatry;  Laterality: Left;  Block   TOE SURGERY Left 01/2023   TUBAL LIGATION      Social History   Socioeconomic History   Marital status: Single    Spouse name: Not on file   Number of children: 4   Years of education: Not on  file   Highest education level: Not on file  Occupational History   Occupation: unemployed  Tobacco Use   Smoking status: Former    Current packs/day: 0.00    Average packs/day: 0.5 packs/day for 30.0 years (15.0 ttl pk-yrs)    Types: Cigarettes    Start date: 70    Quit date: 2017    Years since quitting: 7.6   Smokeless tobacco: Never  Vaping Use   Vaping status: Never Used  Substance and Sexual Activity   Alcohol use: Not Currently    Alcohol/week: 0.0 standard drinks of alcohol    Comment: rare   Drug use: Yes    Types: Marijuana    Comment: occasional   Sexual activity: Not Currently    Partners: Male    Birth control/protection: Surgical  Other Topics Concern   Not on file  Social History Narrative   Lives with 3 kids   Right handed   Drinks 2 cups caffeine daily   Social Determinants of Health   Financial Resource Strain: Not on file  Food Insecurity: Patient Declined (04/20/2022)   Hunger Vital Sign    Worried About Running Out of Food in the Last Year: Patient declined    Ran Out of Food in the Last Year: Patient declined  Transportation Needs: Patient Declined (04/20/2022)   PRAPARE - Administrator, Civil Service (Medical): Patient declined    Lack of Transportation (Non-Medical): Patient declined  Physical Activity: Inactive (02/15/2021)   Exercise Vital Sign    Days of Exercise per Week: 0 days    Minutes of Exercise per Session: 0 min  Stress: Stress Concern Present (02/15/2021)   [employer] of Occupational Health - Occupational Stress Questionnaire    Feeling of Stress : Very much  Social Connections: Moderately Isolated (02/15/2021)   Social Connection and Isolation Panel [NHANES]    Frequency of Communication with Friends and Family: Three times a week    Frequency of Social Gatherings with Friends and Family: Three times a week    Attends Religious Services: More than 4 times per year    Active Member of Clubs or Organizations:  No    Attends Banker Meetings: Never    Marital Status: Never married    Family History  Problem Relation Age of Onset   Asthma Mother    Diabetes Father    Hypertension Father    Kidney disease Father    Heart attack Father    Heart failure Father    Hypertension Sister    Asthma Sister    Congestive Heart Failure Maternal Aunt    Diabetes Maternal Grandmother    Congestive Heart Failure Maternal Grandmother    Lung disease Maternal Grandmother    Asthma Other    Hyperlipidemia Other    Hypertension Other    Cancer Other     Outpatient Encounter Medications as of 03/28/2023  Medication Sig   Semaglutide,0.25 or 0.5MG /DOS, (OZEMPIC, 0.25 OR 0.5 MG/DOSE,) 2 MG/3ML SOPN Inject 0.25 mg into the skin once a week.   Accu-Chek FastClix Lancets MISC 1 each by Other route 4 (four) times daily.   ACCU-CHEK GUIDE test strip 1 each by Other route 4 (four) times daily. for testing   albuterol (VENTOLIN HFA) 108 (90 Base) MCG/ACT inhaler Inhale 1-2 puffs into the lungs every 6 (six) hours as needed for wheezing or shortness of breath (Asthma).   amLODipine (NORVASC) 10 MG tablet Take 1 tablet (10 mg total) by mouth daily.   aspirin 81 MG EC tablet Take 1 tablet (81 mg total) by mouth daily.   blood glucose meter kit and supplies KIT Dispense based on patient and insurance preference. Use up to four times daily as directed. (FOR ICD-9 250.00, 250.01).   Blood Glucose Monitoring Suppl (ACCU-CHEK GUIDE ME) w/Device KIT Use to check blood glucose four times daily   calcitRIOL (ROCALTROL) 0.25 MCG capsule Take 0.25 mcg by mouth daily.   Continuous Blood Gluc Receiver (DEXCOM G7 RECEIVER) DEVI Use to monitor BG continuously   Continuous Glucose Sensor (DEXCOM G7 SENSOR) MISC Change sensor every 10 days   EPINEPHrine 0.3 mg/0.3 mL IJ SOAJ injection Inject 0.3 mg into the muscle as needed for anaphylaxis.   insulin aspart protamine - aspart (NOVOLOG 70/30 MIX) (70-30) 100 UNIT/ML  FlexPen Inject 30-40 Units into the skin 2 (two) times daily with a meal. (Patient taking differently: Inject 30-40 Units into the skin See admin instructions. Take 40 units  in th morning and 30 units in the evening)   Insulin Pen Needle 32G X 4 MM MISC Use to inject insulin twice daily   Insulin Syringes, Disposable, U-100 1 ML MISC 1 application by Does not apply route 2 (two) times a day.   isosorbide-hydrALAZINE (BIDIL) 20-37.5 MG  tablet Take 1 tablet by mouth 3 (three) times daily. (Patient taking differently: Take 2 tablets by mouth 3 (three) times daily.)   labetalol (NORMODYNE) 300 MG tablet Take 300 mg by mouth 2 (two) times daily.   ondansetron (ZOFRAN) 4 MG tablet Take 1 tablet (4 mg total) by mouth every 8 (eight) hours as needed for nausea or vomiting. (Patient not taking: Reported on 03/23/2023)   rosuvastatin (CRESTOR) 40 MG tablet Take 40 mg by mouth in the morning.   No facility-administered encounter medications on file as of 03/28/2023.    ALLERGIES: Allergies  Allergen Reactions   Bee Pollen Other (See Comments)    Seasonal Allergies   Dust Mite Extract Cough    Sneezing & Cough   Mixed Ragweed Itching    VACCINATION STATUS: Immunization History  Administered Date(s) Administered   Influenza Split 04/23/2013    Diabetes She presents for her follow-up diabetic visit. She has type 2 diabetes mellitus. Onset time: She was diagnosed at approximate age of 31 years. Her disease course has been improving. There are no hypoglycemic associated symptoms. Pertinent negatives for hypoglycemia include no confusion, headaches, pallor or seizures. Pertinent negatives for diabetes include no blurred vision, no chest pain, no fatigue, no polydipsia, no polyphagia and no polyuria. There are no hypoglycemic complications. Symptoms are improving. Diabetic complications include a CVA, heart disease, nephropathy, PVD and retinopathy. 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 Current diabetic treatment includes insulin injections and oral agent (monotherapy). Her weight is increasing steadily. She is following a generally unhealthy diet. When asked about meal planning, she reported none. She has had a previous visit with a dietitian. She never participates in exercise. Her home blood glucose trend is decreasing steadily. Her breakfast blood glucose range is generally 140-180 mg/dl. Her lunch blood glucose range is generally 140-180 mg/dl. Her dinner blood glucose range is generally 140-180 mg/dl. Her bedtime blood glucose range is generally 140-180 mg/dl. Her overall blood glucose range is 140-180 mg/dl. (She presents with her  Dexcom CGM device.  She is utilizing and benefiting from it.  Her review shows 78% time range, 19% level 1 hyperglycemia.   This is a significant improvement for her.  Her average blood glucose is 136 for the last 14 days.    Her POC A1c is 7.4% improving from 13.4%.   ) An ACE inhibitor/angiotensin II receptor blocker is being taken. Eye exam is current.  Hypertension This is a chronic problem. The current episode started more than 1 year ago. The problem is uncontrolled. Pertinent negatives include no blurred vision, chest pain, headaches, palpitations or shortness of breath. Risk factors for coronary artery disease include family history, dyslipidemia, diabetes mellitus, obesity, sedentary lifestyle, smoking/tobacco exposure and post-menopausal state. Past treatments include beta blockers, direct vasodilators, calcium channel blockers and ACE inhibitors. The current treatment provides no improvement. Hypertensive end-organ damage includes kidney disease, CAD/MI, CVA, PVD and retinopathy.  Hyperlipidemia This is a chronic problem. The current episode started  more than 1 year ago. The problem is uncontrolled. Exacerbating diseases include diabetes and obesity. Pertinent negatives include no chest pain, myalgias or shortness of breath. Risk factors for coronary artery disease include diabetes mellitus, dyslipidemia, hypertension, family history, post-menopausal, a sedentary lifestyle and obesity.     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  CMP ( most recent) CMP     Component Value Date/Time   NA 140 03/03/2023 0840   NA 132 (L) 06/29/2022 0917   K 3.5 03/03/2023 0840   CL 101 03/03/2023 0840   CO2 22 03/03/2023 0840   GLUCOSE 170 (H) 03/03/2023 0840   BUN 54 (H) 03/03/2023 0840   BUN 46 (H) 06/29/2022 0917   CREATININE 4.56 (H) 03/03/2023 0840   CALCIUM 7.4 (L) 03/03/2023 0840   CALCIUM 7.2 (L) 03/03/2023 0840   PROT 7.5 06/29/2022 0917   ALBUMIN 3.2 (L) 03/03/2023 0840   ALBUMIN 4.1 06/29/2022 0917   AST 11 06/29/2022 0917   ALT 9 06/29/2022 0917   ALKPHOS 107 06/29/2022 0917   BILITOT 0.3 06/29/2022 0917   GFRNONAA 11 (L) 03/03/2023 0840   GFRAA 51 (L) 03/30/2020 1059     Diabetic Labs (most recent): Lab Results  Component Value Date   HGBA1C 7.4 (A) 03/28/2023   HGBA1C 14.5 (A) 11/24/2022   HGBA1C 7.9 (H) 04/22/2022     Lipid Panel ( most recent) Lipid Panel     Component Value Date/Time   CHOL 194 06/29/2022 0917   TRIG 480 (H) 06/29/2022 0917   HDL 42 06/29/2022 0917   CHOLHDL 4.6 (H) 06/29/2022 0917    CHOLHDL 4.9 02/12/2019 0537   VLDL 77 (H) 02/12/2019 0537   LDLCALC 76 06/29/2022 0917   LABVLDL 76 (H) 06/29/2022 0917      Lab Results  Component Value Date   TSH 1.880 06/29/2022   TSH 1.115 04/20/2022   TSH 1.470 03/30/2020   TSH 1.380 07/31/2019   FREET4 1.22 06/29/2022       Assessment & Plan:   1. Type 2 diabetes mellitus with stage 4 chronic kidney disease, with long-term current use of insulin (HCC)  - [name] has currently uncontrolled symptomatic type 2 DM since  52 years of age.  She presents with her  Dexcom CGM device.  She is utilizing and benefiting from it.  Her review shows 78% time range, 19% level 1 hyperglycemia.   This is a significant improvement for her.  Her average blood glucose is 136 for the last 14 days.    Her POC A1c is 7.4% improving from 13.4%. Recent labs reviewed. - I had a long discussion with her about the possible risk factors and  the pathology behind its diabetes and its complications. -her diabetes is complicated by CVA, CHF, coronary artery disease, retinopathy, nephropathy, obesity/sedentary life, polypharmacy and she remains at exceedingly high risk for more acute and chronic complications which include CAD, CVA, CKD, retinopathy, and neuropathy. These are all discussed in detail with her.  - I discussed all available options of managing her diabetes including de-escalation of medications.  I have counseled her on Food as Medicine by adopting a Whole Food , Plant Predominant  ( WFPP) nutrition as recommended by [employer] of Lifestyle Medicine. Patient is encouraged to switch to  unprocessed or minimally processed  complex starch, adequate protein intake (mainly plant source), minimal liquid fat, plenty of fruits, and vegetables. -  she is advised to stick to a routine mealtimes to eat 3 complete meals a day and snack only when necessary ( to snack only to correct hypoglycemia BG <70 day time or <100 at night).   - she  acknowledges that there is a room for improvement in her food and drink choices. - Suggestion is made for her to avoid simple carbohydrates  from her diet including Cakes, Sweet Desserts, Ice Cream, Soda (diet and regular), Sweet Tea, Candies, Chips, Cookies, Store Bought Juices, Alcohol , Artificial Sweeteners,  Coffee Creamer, and "Sugar-free" Products, Lemonade. This will help patient to have more stable blood glucose profile and potentially avoid unintended weight gain.  The following Lifestyle Medicine recommendations according to American College of Lifestyle Medicine  [facility]) were discussed and and offered to patient and she  agrees to start the journey:  A. Whole Foods, Plant-Based Nutrition comprising of fruits and vegetables, plant-based proteins, whole-grain carbohydrates was discussed in detail with the patient.   A list for source of those nutrients were also provided to the patient.  Patient will use only water or unsweetened tea for hydration. B.  The need to stay away from risky substances including alcohol, smoking; obtaining 7 to 9 hours of restorative sleep, at least 150 minutes of moderate intensity exercise weekly, the importance of healthy social connections,  and stress management techniques were discussed. C.  A full color page of  Calorie density of various food groups per pound showing examples of each food groups was provided to the patient.    She has not seen her nutritionist yet. - I have approached her with the following individualized plan to manage  her diabetes and patient agrees:   She presents with significantly improved glycemic profile, and improved A1c at point-of-care today at 7.4%.  She continues to respond to her premixed insulin.  I advised her to continue her NovoLog 70/30 40 units daily at breakfast, 30 units daily with supper only if blood glucose readings are above 90 mg per DL, and only if she is eating.  She is warned not to take insulin without proper  monitoring. -She has a Dexcom CGM now, advised to monitor blood glucose continuously. -She is status post left big toe amputation.  She is not a suitable candidate for SGLT2 inhibitors.  She will however benefit from GLP-1 receptor agonist.  I discussed and initiated Ozempic 0.25 mg subcutaneously weekly  to advance as tolerated.   - she is encouraged to call clinic for blood glucose levels less than 70 or above 200 mg /dl.  - Specific targets for  A1c;  LDL, HDL,  and Triglycerides were discussed with the patient.  2) Blood Pressure /Hypertension:  -Her blood pressure is controlled to target.   -She is a former smoker.  She has a number of medications to take for blood pressure and encouraged to resume and continue: amlodipine/benazepril 10/40 mg p.o. daily, clonidine 0.3 mg patch weekly, Lasix 40 mg daily, isosorbide-hydralazine 28-37.5 mg 3 times daily, labetalol 300 mg p.o. twice a day.  This is an adequate and probably more than adequate prescription for her hypertension.  There is a  questionable compliance with medications in this patient.   3) Lipids/Hyperlipidemia: Review of her recent lipid panel showed LDL controlled at 76.  She is advised to continue her Crestor 40 mg p.o. nightly.  Side effects and precautions discussed with her.  4)  Weight/Diet:  Body mass index is 36.8 kg/m.  -   clearly complicating her diabetes care.   she is  a candidate for weight loss. I discussed with her the fact that loss of 5 - 10% of her  current body weight will have the most impact on her diabetes management.  The above detailed  ACLM recommendations for nutrition, exercise, sleep, social life, avoidance of risky substances, the need for restorative sleep   information will also detailed on discharge instructions.  5) Chronic Care/Health Maintenance:  -she  is on ACEI/ARB and Statin medications and  is encouraged to initiate and continue to follow up with Ophthalmology, Dentist, nephrology, podiatrist  at least yearly or according to recommendations, and advised to  quit tobacco products/smoking. I have recommended yearly flu vaccine and pneumonia vaccine at least every 5 years; moderate intensity exercise for up to 150 minutes weekly; and  sleep for 7- 9 hours a day. She is advised to continue close follow-up with her podiatry regarding infected left foot ulcer.  - she is  advised to maintain close follow up with Jackie Plum, MD for primary care needs, as well as her other providers for optimal and coordinated care.  I spent  26  minutes in the care of the patient today including review of labs from CMP, Lipids, Thyroid Function, Hematology (current and previous including abstractions from other facilities); face-to-face time discussing  her blood glucose readings/logs, discussing hypoglycemia and hyperglycemia episodes and symptoms, medications doses, her options of short and long term treatment based on the latest standards of care / guidelines;  discussion about incorporating lifestyle medicine;  and documenting the encounter. Risk reduction counseling performed per USPSTF guidelines to reduce  obesity and cardiovascular risk factors.     Please refer to Patient Instructions for Blood Glucose Monitoring and Insulin/Medications Dosing Guide"  in media tab for additional information. Please  also refer to " Patient Self Inventory" in the Media  tab for reviewed elements of pertinent patient history.  [name] participated in the discussions, expressed understanding, and voiced agreement with the above plans.  All questions were answered to her satisfaction. she is encouraged to contact clinic should she have any questions or concerns prior to her return visit.   Follow up plan: - Return in about 3 months (around 06/28/2023) for Bring Meter/CGM Device/Logs- A1c in Office.  Marquis Lunch, MD [facility] [address] [place], [place] [address] Phone: [phone]  Fax: [phone]    03/28/2023, 12:23 PM  This note was partially dictated with voice recognition software. Similar sounding words can be transcribed inadequately or may not  be corrected upon review.

## 2023-03-30 ENCOUNTER — Other Ambulatory Visit (HOSPITAL_COMMUNITY): Payer: Self-pay | Admitting: *Deleted

## 2023-03-31 ENCOUNTER — Ambulatory Visit (HOSPITAL_COMMUNITY)
Admission: RE | Admit: 2023-03-31 | Discharge: 2023-03-31 | Disposition: A | Discharge status: Home / Self Care | Payer: Medicaid Other | Source: Ambulatory Visit | Attending: Nephrology | Admitting: Nephrology

## 2023-03-31 ENCOUNTER — Other Ambulatory Visit: Payer: Self-pay

## 2023-03-31 ENCOUNTER — Encounter (HOSPITAL_COMMUNITY): Payer: Self-pay | Admitting: Vascular Surgery

## 2023-03-31 VITALS — BP 131/72 | HR 73 | Temp 98.2°F | Resp 17 | Wt 245.0 lb

## 2023-03-31 DIAGNOSIS — N184 Chronic kidney disease, stage 4 (severe): Secondary | ICD-10-CM | POA: Insufficient documentation

## 2023-03-31 DIAGNOSIS — N179 Acute kidney failure, unspecified: Secondary | ICD-10-CM | POA: Insufficient documentation

## 2023-03-31 LAB — POCT HEMOGLOBIN-HEMACUE: Hemoglobin: 8.7 g/dL — ABNORMAL LOW (ref 12.0–15.0)

## 2023-03-31 MED ORDER — EPOETIN ALFA-EPBX 10000 UNIT/ML IJ SOLN
20000.0000 [IU] | INTRAMUSCULAR | Status: DC
  Administered 2023-03-31: 20000 [IU] via SUBCUTANEOUS

## 2023-03-31 MED ORDER — EPOETIN ALFA-EPBX 10000 UNIT/ML IJ SOLN
INTRAMUSCULAR | Status: AC
  Filled 2023-03-31: qty 2

## 2023-03-31 MED ORDER — SODIUM CHLORIDE 0.9 % IV SOLN
510.0000 mg | INTRAVENOUS | Status: DC
  Administered 2023-03-31: 510 mg via INTRAVENOUS
  Filled 2023-03-31: qty 510

## 2023-03-31 NOTE — Progress Notes (Signed)
Spoke with pt for pre-op call. Pt sees Dr. Rosemary Holms for HTN  and hyperlipidemia. Pt denies CAD. Pt is a type 2 Diabetic. Last A1C was 7.4 on 03/28/23. She does have a continuous monitor and states her fasting blood sugar today was 119. Pt is on Novolog 70/30 in the AM and PM. Instructed pt to take 70% of her PM dose on Monday, 04/03/23. She will take 21 units that evening. Instructed her NOT to take her AM dose day of surgery. Also instructed pt to check her blood sugar when she gets up Tuesday AM. If blood sugar is 70 or below, treat with 1/2 cup of clear juice (apple or cranberry) and recheck blood sugar 15 minutes after drinking juice. Report blood sugar to nurse on arrival to Short Stay.   Pt has not started Ozempic, she stated she was picking it up today. I instructed her not to take prior to surgery. She voiced understanding.   Shower instructions given to pt.

## 2023-04-03 NOTE — Anesthesia Preprocedure Evaluation (Signed)
Anesthesia Evaluation  Patient identified by MRN, date of birth, ID band Patient awake    Reviewed: Allergy & Precautions, NPO status , [name] Chart, lab work & pertinent test results  Airway Mallampati: II  TM Distance: >3 FB Neck ROM: Full    Dental no notable dental hx. (+) Missing, Chipped, Dental Advisory Given, Teeth Intact,    Pulmonary asthma , sleep apnea and Continuous Positive Airway Pressure Ventilation , former smoker   Pulmonary exam normal breath sounds clear to auscultation       Cardiovascular hypertension, Pt. on medications and Pt. on home beta blockers Normal cardiovascular exam Rhythm:Regular Rate:Normal  Echocardiogram 07/27/2021:  Left ventricle cavity is normal in size. 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 Max gradient 10 mmHg.    Normal global wall motion. Normal LV systolic function with EF 55%.  Indeterminate diastolic filling pattern.  Structurally normal trileaflet aortic valve. No evidence of aortic  stenosis. Moderate (Grade II) aortic regurgitation.  Mild (Grade I) mitral regurgitation.  Mild tricuspid regurgitation. Estimated pulmonary artery systolic pressure  27 mmHg.  Mild pulmonic regurgitation.  No significant change compared to previous study on 10/16/2019     Neuro/Psych  PSYCHIATRIC DISORDERS Anxiety     CVA, No Residual Symptoms    GI/Hepatic   Endo/Other  diabetes, Type 2    Renal/GU CRFRenal diseaseLab Results      Component                Value               Date                      NA                       140                 03/03/2023                CL                       101                 03/03/2023                K                        3.5                 03/03/2023                CO2                      22                  03/03/2023                BUN                      54 (H)              03/03/2023                 CREATININE               4.56 (H)  03/03/2023                GFRNONAA                 11 (L)              03/03/2023                CALCIUM                  7.4 (L)             03/03/2023                CALCIUM                  7.2 (L)             03/03/2023                PHOS                     4.8 (H)             03/03/2023                ALBUMIN                  3.2 (L)             03/03/2023                GLUCOSE                  170 (H)             03/03/2023                Musculoskeletal  (+) Arthritis ,    Abdominal  (+) + obese (BMI 36.33)  Peds  Hematology  (+) Blood dyscrasia, anemia Lab Results      Component                Value               Date                       HGB                      8.7 (L)             03/31/2023                     Anesthesia Other Findings   Reproductive/Obstetrics                              Anesthesia Physical Anesthesia Plan  ASA: 3  Anesthesia Plan: Regional   Post-op Pain Management: Regional block* and Minimal or no pain anticipated   Induction:   PONV Risk Score and Plan: 2 and Propofol infusion and Treatment may vary due to age or medical condition  Airway Management Planned: Nasal Cannula and Natural Airway  Additional Equipment: None  Intra-op Plan:   Post-operative Plan:   Informed Consent: I have reviewed the patients History and Physical, chart, labs and discussed the procedure including the risks, benefits and alternatives for the proposed anesthesia with the patient or authorized representative who has indicated his/her understanding and acceptance.     Dental advisory given  Plan Discussed with:   Anesthesia Plan Comments: (R ISB + mac)         Anesthesia Quick Evaluation

## 2023-04-04 ENCOUNTER — Ambulatory Visit (HOSPITAL_COMMUNITY)
Admission: RE | Admit: 2023-04-04 | Discharge: 2023-04-04 | Disposition: A | Discharge status: Home / Self Care | Payer: Medicaid Other | Source: Home / Self Care | Attending: Vascular Surgery | Admitting: Vascular Surgery

## 2023-04-04 ENCOUNTER — Encounter (HOSPITAL_COMMUNITY)
Admission: RE | Disposition: A | Discharge status: Home / Self Care | Payer: Self-pay | Source: Home / Self Care | Attending: Vascular Surgery

## 2023-04-04 ENCOUNTER — Encounter (HOSPITAL_COMMUNITY): Payer: Self-pay | Admitting: Vascular Surgery

## 2023-04-04 ENCOUNTER — Other Ambulatory Visit: Payer: Self-pay

## 2023-04-04 ENCOUNTER — Telehealth: Payer: Self-pay | Admitting: Vascular Surgery

## 2023-04-04 ENCOUNTER — Ambulatory Visit (HOSPITAL_COMMUNITY): Payer: Medicaid Other | Admitting: Anesthesiology

## 2023-04-04 ENCOUNTER — Ambulatory Visit (HOSPITAL_BASED_OUTPATIENT_CLINIC_OR_DEPARTMENT_OTHER): Payer: Medicaid Other | Admitting: Anesthesiology

## 2023-04-04 DIAGNOSIS — E1122 Type 2 diabetes mellitus with diabetic chronic kidney disease: Secondary | ICD-10-CM

## 2023-04-04 DIAGNOSIS — E669 Obesity, unspecified: Secondary | ICD-10-CM | POA: Insufficient documentation

## 2023-04-04 DIAGNOSIS — N184 Chronic kidney disease, stage 4 (severe): Secondary | ICD-10-CM

## 2023-04-04 DIAGNOSIS — Z89412 Acquired absence of left great toe: Secondary | ICD-10-CM | POA: Diagnosis not present

## 2023-04-04 DIAGNOSIS — N185 Chronic kidney disease, stage 5: Secondary | ICD-10-CM | POA: Diagnosis not present

## 2023-04-04 DIAGNOSIS — N186 End stage renal disease: Secondary | ICD-10-CM | POA: Insufficient documentation

## 2023-04-04 DIAGNOSIS — Z87891 Personal history of nicotine dependence: Secondary | ICD-10-CM | POA: Insufficient documentation

## 2023-04-04 DIAGNOSIS — Z992 Dependence on renal dialysis: Secondary | ICD-10-CM | POA: Insufficient documentation

## 2023-04-04 DIAGNOSIS — I509 Heart failure, unspecified: Secondary | ICD-10-CM | POA: Diagnosis not present

## 2023-04-04 DIAGNOSIS — G4730 Sleep apnea, unspecified: Secondary | ICD-10-CM | POA: Insufficient documentation

## 2023-04-04 DIAGNOSIS — Z6836 Body mass index (BMI) 36.0-36.9, adult: Secondary | ICD-10-CM | POA: Insufficient documentation

## 2023-04-04 DIAGNOSIS — Z79899 Other long term (current) drug therapy: Secondary | ICD-10-CM | POA: Insufficient documentation

## 2023-04-04 DIAGNOSIS — J45909 Unspecified asthma, uncomplicated: Secondary | ICD-10-CM | POA: Diagnosis not present

## 2023-04-04 DIAGNOSIS — Z8673 Personal history of transient ischemic attack (TIA), and cerebral infarction without residual deficits: Secondary | ICD-10-CM | POA: Insufficient documentation

## 2023-04-04 DIAGNOSIS — I130 Hypertensive heart and chronic kidney disease with heart failure and stage 1 through stage 4 chronic kidney disease, or unspecified chronic kidney disease: Secondary | ICD-10-CM

## 2023-04-04 DIAGNOSIS — I120 Hypertensive chronic kidney disease with stage 5 chronic kidney disease or end stage renal disease: Secondary | ICD-10-CM | POA: Insufficient documentation

## 2023-04-04 HISTORY — PX: AV FISTULA PLACEMENT: SHX1204

## 2023-04-04 HISTORY — DX: Dyspnea, unspecified: R06.00

## 2023-04-04 LAB — POCT I-STAT, CHEM 8
BUN: 53 mg/dL — ABNORMAL HIGH (ref 6–20)
Calcium, Ion: 1.02 mmol/L — ABNORMAL LOW (ref 1.15–1.40)
Chloride: 104 mmol/L (ref 98–111)
Creatinine, Ser: 5.3 mg/dL — ABNORMAL HIGH (ref 0.44–1.00)
Glucose, Bld: 212 mg/dL — ABNORMAL HIGH (ref 70–99)
HCT: 25 % — ABNORMAL LOW (ref 36.0–46.0)
Hemoglobin: 8.5 g/dL — ABNORMAL LOW (ref 12.0–15.0)
Potassium: 3.4 mmol/L — ABNORMAL LOW (ref 3.5–5.1)
Sodium: 141 mmol/L (ref 135–145)
TCO2: 21 mmol/L — ABNORMAL LOW (ref 22–32)

## 2023-04-04 LAB — GLUCOSE, CAPILLARY
Glucose-Capillary: 182 mg/dL — ABNORMAL HIGH (ref 70–99)
Glucose-Capillary: 197 mg/dL — ABNORMAL HIGH (ref 70–99)

## 2023-04-04 SURGERY — ARTERIOVENOUS (AV) FISTULA CREATION
Anesthesia: Regional | Site: Arm Lower | Laterality: Right

## 2023-04-04 MED ORDER — MIDAZOLAM HCL 2 MG/2ML IJ SOLN
INTRAMUSCULAR | Status: DC | PRN
  Administered 2023-04-04: 2 mg via INTRAVENOUS

## 2023-04-04 MED ORDER — HEPARIN SODIUM (PORCINE) 1000 UNIT/ML IJ SOLN
INTRAMUSCULAR | Status: DC | PRN
  Administered 2023-04-04: 2000 [IU] via INTRAVENOUS

## 2023-04-04 MED ORDER — ORAL CARE MOUTH RINSE: 15.0000 mL | Freq: Once | OROMUCOSAL | Status: AC

## 2023-04-04 MED ORDER — BUPIVACAINE HCL (PF) 0.5 % IJ SOLN
INTRAMUSCULAR | Status: DC | PRN
  Administered 2023-04-04: 25 mL via PERINEURAL

## 2023-04-04 MED ORDER — 0.9 % SODIUM CHLORIDE (POUR BTL) OPTIME
TOPICAL | Status: DC | PRN
  Administered 2023-04-04: 1000 mL

## 2023-04-04 MED ORDER — PROPOFOL 10 MG/ML IV BOLUS
INTRAVENOUS | Status: AC
  Filled 2023-04-04: qty 20

## 2023-04-04 MED ORDER — PHENYLEPHRINE HCL-NACL 20-0.9 MG/250ML-% IV SOLN
INTRAVENOUS | Status: DC | PRN
  Administered 2023-04-04: 40 ug/min via INTRAVENOUS

## 2023-04-04 MED ORDER — IPRATROPIUM-ALBUTEROL 0.5-2.5 (3) MG/3ML IN SOLN
RESPIRATORY_TRACT | Status: AC
  Filled 2023-04-04: qty 3

## 2023-04-04 MED ORDER — PROPOFOL 500 MG/50ML IV EMUL
INTRAVENOUS | Status: DC | PRN
  Administered 2023-04-04: 100 ug/kg/min via INTRAVENOUS
  Administered 2023-04-04: 20 ug via INTRAVENOUS

## 2023-04-04 MED ORDER — FENTANYL CITRATE (PF) 250 MCG/5ML IJ SOLN
INTRAMUSCULAR | Status: DC | PRN
  Administered 2023-04-04: 100 ug via INTRAVENOUS

## 2023-04-04 MED ORDER — CHLORHEXIDINE GLUCONATE 4 % EX SOLN: 60.0000 mL | Freq: Once | CUTANEOUS | Status: DC

## 2023-04-04 MED ORDER — ONDANSETRON HCL 4 MG/2ML IJ SOLN
INTRAMUSCULAR | Status: DC | PRN
  Administered 2023-04-04: 4 mg via INTRAVENOUS

## 2023-04-04 MED ORDER — MIDAZOLAM HCL 2 MG/2ML IJ SOLN
INTRAMUSCULAR | Status: AC
  Filled 2023-04-04: qty 2

## 2023-04-04 MED ORDER — HEPARIN 6000 UNIT IRRIGATION SOLUTION
Status: DC | PRN
  Administered 2023-04-04: 1

## 2023-04-04 MED ORDER — DIPHENHYDRAMINE HCL 50 MG/ML IJ SOLN
INTRAMUSCULAR | Status: DC | PRN
  Administered 2023-04-04: 25 mg via INTRAVENOUS

## 2023-04-04 MED ORDER — INSULIN ASPART 100 UNIT/ML IJ SOLN
0.0000 [IU] | INTRAMUSCULAR | Status: DC | PRN
  Administered 2023-04-04: 2 [IU] via SUBCUTANEOUS

## 2023-04-04 MED ORDER — HEPARIN 6000 UNIT IRRIGATION SOLUTION
Status: AC
  Filled 2023-04-04: qty 500

## 2023-04-04 MED ORDER — SODIUM CHLORIDE 0.9 % IV SOLN: INTRAVENOUS | Status: DC

## 2023-04-04 MED ORDER — ONDANSETRON HCL 4 MG/2ML IJ SOLN
INTRAMUSCULAR | Status: AC
  Filled 2023-04-04: qty 2

## 2023-04-04 MED ORDER — PHENYLEPHRINE 80 MCG/ML (10ML) SYRINGE FOR IV PUSH (FOR BLOOD PRESSURE SUPPORT)
PREFILLED_SYRINGE | INTRAVENOUS | Status: DC | PRN
  Administered 2023-04-04: 80 ug via INTRAVENOUS

## 2023-04-04 MED ORDER — CEFAZOLIN SODIUM-DEXTROSE 2-4 GM/100ML-% IV SOLN
2.0000 g | INTRAVENOUS | Status: AC
  Administered 2023-04-04: 2 g via INTRAVENOUS
  Filled 2023-04-04: qty 100

## 2023-04-04 MED ORDER — INSULIN ASPART 100 UNIT/ML IJ SOLN
INTRAMUSCULAR | Status: AC
  Filled 2023-04-04: qty 1

## 2023-04-04 MED ORDER — LIDOCAINE 2% (20 MG/ML) 5 ML SYRINGE
INTRAMUSCULAR | Status: AC
  Filled 2023-04-04: qty 5

## 2023-04-04 MED ORDER — FENTANYL CITRATE (PF) 250 MCG/5ML IJ SOLN
INTRAMUSCULAR | Status: AC
  Filled 2023-04-04: qty 5

## 2023-04-04 MED ORDER — CHLORHEXIDINE GLUCONATE 0.12 % MT SOLN
15.0000 mL | Freq: Once | OROMUCOSAL | Status: AC
  Administered 2023-04-04: 15 mL via OROMUCOSAL
  Filled 2023-04-04: qty 15

## 2023-04-04 MED ORDER — DEXMEDETOMIDINE HCL IN NACL 80 MCG/20ML IV SOLN
INTRAVENOUS | Status: DC | PRN
  Administered 2023-04-04: 4 ug via INTRAVENOUS

## 2023-04-04 MED ORDER — IPRATROPIUM-ALBUTEROL 0.5-2.5 (3) MG/3ML IN SOLN
3.0000 mL | RESPIRATORY_TRACT | Status: DC
  Administered 2023-04-04: 3 mL via RESPIRATORY_TRACT

## 2023-04-04 MED ORDER — OXYCODONE-ACETAMINOPHEN 5-325 MG PO TABS: 1.0000 | ORAL_TABLET | Freq: Four times a day (QID) | ORAL | 0 refills | Status: DC | PRN

## 2023-04-04 MED ORDER — LIDOCAINE-EPINEPHRINE 1 %-1:100000 IJ SOLN
INTRAMUSCULAR | Status: AC
  Filled 2023-04-04: qty 1

## 2023-04-04 SURGICAL SUPPLY — 35 items
ADH SKN CLS APL DERMABOND .7 (GAUZE/BANDAGES/DRESSINGS) ×1
ARMBAND PINK RESTRICT EXTREMIT (MISCELLANEOUS) ×2 IMPLANT
BAG COUNTER SPONGE SURGICOUNT (BAG) ×2 IMPLANT
BAG SPNG CNTER NS LX DISP (BAG) ×1
BLADE CLIPPER SURG (BLADE) ×2 IMPLANT
BNDG ELASTIC 4X5.8 VLCR STR LF (GAUZE/BANDAGES/DRESSINGS) ×2 IMPLANT
CANISTER SUCT 3000ML PPV (MISCELLANEOUS) ×2 IMPLANT
CLIP TI MEDIUM 6 (CLIP) ×4 IMPLANT
CLIP TI WIDE RED SMALL 6 (CLIP) ×2 IMPLANT
COVER PROBE W GEL 5X96 (DRAPES) ×2 IMPLANT
DERMABOND ADVANCED .7 DNX12 (GAUZE/BANDAGES/DRESSINGS) ×2 IMPLANT
ELECT REM PT RETURN 9FT ADLT (ELECTROSURGICAL) ×1
ELECTRODE REM PT RTRN 9FT ADLT (ELECTROSURGICAL) ×2 IMPLANT
GLOVE BIOGEL PI IND STRL 8 (GLOVE) ×2 IMPLANT
GOWN STRL REUS W/ TWL LRG LVL3 (GOWN DISPOSABLE) ×4 IMPLANT
GOWN STRL REUS W/TWL 2XL LVL3 (GOWN DISPOSABLE) ×4 IMPLANT
GOWN STRL REUS W/TWL LRG LVL3 (GOWN DISPOSABLE) ×2
KIT BASIN OR (CUSTOM PROCEDURE TRAY) ×2 IMPLANT
KIT TURNOVER KIT B (KITS) ×2 IMPLANT
NS IRRIG 1000ML POUR BTL (IV SOLUTION) ×2 IMPLANT
PACK CV ACCESS (CUSTOM PROCEDURE TRAY) ×2 IMPLANT
PAD ARMBOARD 7.5X6 YLW CONV (MISCELLANEOUS) ×4 IMPLANT
SLING ARM FOAM STRAP LRG (SOFTGOODS) IMPLANT
SLING ARM FOAM STRAP MED (SOFTGOODS) IMPLANT
SPIKE FLUID TRANSFER (MISCELLANEOUS) ×2 IMPLANT
SUT MNCRL AB 4-0 PS2 18 (SUTURE) ×2 IMPLANT
SUT PROLENE 6 0 BV (SUTURE) ×2 IMPLANT
SUT PROLENE 7 0 BV 1 (SUTURE) IMPLANT
SUT SILK 2 0 SH (SUTURE) IMPLANT
SUT SILK 3 0 SH CR/8 (SUTURE) ×2 IMPLANT
SUT VIC AB 3-0 SH 27 (SUTURE) ×1
SUT VIC AB 3-0 SH 27X BRD (SUTURE) ×2 IMPLANT
TOWEL GREEN STERILE (TOWEL DISPOSABLE) ×2 IMPLANT
UNDERPAD 30X36 HEAVY ABSORB (UNDERPADS AND DIAPERS) ×2 IMPLANT
WATER STERILE IRR 1000ML POUR (IV SOLUTION) ×2 IMPLANT

## 2023-04-04 NOTE — Discharge Instructions (Addendum)

## 2023-04-04 NOTE — Op Note (Signed)
    NAME: [name]    MRN: [id number] DOB: 10/01/1970    DATE OF OPERATION: 04/04/2023  PREOP DIAGNOSIS:    Chronic Kidney Disease Stage 5  POSTOP DIAGNOSIS:    Same  PROCEDURE:    Right arm brachiocephalic fistula   SURGEON: Victorino Sparrow  ASSIST: Mosetta Pigeon, PA  ANESTHESIA: Block   EBL: 5ml  INDICATIONS:    Teresa BLACKSHER is a 52 y.o. female with history of CKD 5, need of long-term HD access.  She has a history of failed left arm brachiocephalic fistula.  After discussing the risks and benefits of right arm brachiocephalic fistula, [name] elected to proceed.  FINDINGS:   4 mm cephalic vein High bifurcation of the brachial artery, 3 mm radial and ulnar arteries  TECHNIQUE:   The patient was brought to the operating room and placed in supine position. The right arm was prepped and draped in standard fashion. IV antibiotics were administered. A timeout was performed.   The case began with ultrasound insonation of the brachial artery and cephalic vein, which demonstrated sufficient size at the antecubital fossa for arteriovenous fistula.  There is a high bifurcation of the right brachial artery, however the radial and ulnar arteries seemed sizable enough for fistula creation.  A transverse incision was made below the elbow creese in the antecubital fossa. The  cephalic vein was isolated for 3 cm in length. Next the aponeurosis was partially released and the brachial artery secured with a vessel loop. The patient was heparinized. The cephalic vein was transected and ligated distally with a 2-0 silk stick-tie. The vein was dilated with coronary dilators and flushed with heparin saline. Vascular clamps were placed proximally and distally on the brachial artery and a 5 mm arteriotomy was created on the brachial artery. This was flushed with heparin saline.  An anastomosis was created in end to side fashion on the brachial artery using running 6-0 Prolene  suture.  Prior to completing the anastomosis, the vessels were flushed and the suture line was tied down. There was an excellent thrill in the cephalic vein from the anastomosis to the mid upper bicipital region. The patient had a multiphasic radial and ulnar signal. He had an excellent doppler signal in the fistula. The incision was irrigated and hemostasis achieved with cautery and suture. The deeper tissue was closed with 3-0 Vicryl and the skin closed with 4-0 Monocryl.  Dermabond was applied to the incisions. The patient was transferred to PACU in stable condition.  Given the complexity of the case,  the assistant was necessary in order to expedient the procedure and safely perform the technical aspects of the operation.  The assistant provided traction and countertraction to assist with exposure of the artery and vein.  They also assisted with suture ligation of multiple venous branches.  They played a critical role in the anastomosis. These skills, especially following the Prolene suture for the anastomosis, could not have been adequately performed by a scrub tech assistant.   Patient will likely require superficialization at a later date due to copious amount of adipose tissue.    [name], MD [facility] DATE OF DICTATION:   04/04/2023

## 2023-04-04 NOTE — H&P (Signed)
Office Note   Patient seen and examined in preop holding.  No complaints. No changes to medication history or physical exam since last seen in clinic. After discussing the risks and benefits of right arm fistula, [name] elected to proceed.   Victorino Sparrow MD   CC: CKD 5 Requesting Provider:  No ref. provider found  HPI: [name] is a Right handed 52 y.o. ([birth year]) female with kidney disease who presents at the request of No ref. provider found for permanent HD access. The patient has had one prior access procedure -left brachiocephalic fistula which failed to mature.  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  A native of [place], she moved down to [place] for change of scenery. She had no questions regarding access after previously having undergone left-sided radiocephalic fistula.  Past surgical history includes left great toe amputation for osteomyelitis which has healed.   Past Medical History:  Diagnosis Date   Anemia    Anxiety    Arthritis    Asthma    Cataract    Mixed form OU   CKD (chronic kidney disease)    Coronary artery disease    Depression    Diabetes mellitus    Diabetic retinopathy (HCC)    NPDR OU   Dyspnea    Hyperlipidemia    Hypertension    Hypertensive retinopathy    OU   Left thyroid nodule    diagnosed 07/2018   PAC (premature atrial contraction) 02/15/2021   Pseudotumor cerebri    Sleep apnea    Uses a cpap   Stroke (HCC)    Vitamin D deficiency     Past Surgical History:  Procedure Laterality Date   ACHILLES TENDON REPAIR Right    ACHILLES TENDON SURGERY Left 02/19/2021   Procedure: ACHILLES TENDON REPAIR WITH GRAFT;  Surgeon: Candelaria Stagers, DPM;  Location: [facility];  Service: Podiatry;  Laterality: Left;  BLOCK   AMPUTATION TOE Right 05/28/2021   Procedure: AMPUTATION TOE;  Surgeon: Louann Sjogren, MD;  Location: [facility] OR;  Service: Podiatry;  Laterality: Right;   Surgical team will do block   AV FISTULA PLACEMENT Left 07/29/2022   Procedure: LEFT ARTERIOVENOUS (AV) FISTULA CREATION;  Surgeon: Maeola Harman, MD;  Location[facility];  Service: Vascular;  Laterality: Left;   GASTROC RECESSION EXTREMITY Left 02/19/2021   Procedure: GASTROC RECESSION EXTREMITY;  Surgeon: Candelaria Stagers, DPM;  Location: [facility];  Service: Podiatry;  Laterality: Left;  Block   TOE SURGERY Left 01/2023   TUBAL LIGATION      Social History   Socioeconomic History   Marital status: Single    Spouse name: Not on file   Number of children: 4   Years of education: Not on file   Highest education level: Not on file  Occupational History   Occupation: unemployed  Tobacco Use   Smoking status: Former    Current packs/day: 0.00    Average packs/day: 0.5 packs/day for 30.0 years (15.0 ttl pk-yrs)    Types: Cigarettes    Start date: 36    Quit date: 2017    Years since quitting: 7.6    Passive exposure: Past   Smokeless tobacco: Never  Vaping Use   Vaping status: Never Used  Substance and Sexual Activity   Alcohol use: Not Currently    Alcohol/week: 0.0 standard drinks of alcohol    Comment: rare   Drug  use: Not Currently    Types: Marijuana    Comment: occasional   Sexual activity: Not Currently    Partners: Male    Birth control/protection: Surgical  Other Topics Concern   Not on file  Social History Narrative   Lives with 3 kids   Right handed   Drinks 2 cups caffeine daily   Social Determinants of Health   Financial Resource Strain: Not on file  Food Insecurity: Patient Declined (04/20/2022)   Hunger Vital Sign    Worried About Running Out of Food in the Last Year: Patient declined    Ran Out of Food in the Last Year: Patient declined  Transportation Needs: Patient Declined (04/20/2022)   PRAPARE - Transportation    Lack of Transportation (Medical): Patient declined    Lack of Transportation (Non-Medical): Patient declined   Physical Activity: Inactive (02/15/2021)   Exercise Vital Sign    Days of Exercise per Week: 0 days    Minutes of Exercise per Session: 0 min  Stress: Stress Concern Present (02/15/2021)   [employer] of Occupational Health - Occupational Stress Questionnaire    Feeling of Stress : Very much  Social Connections: Moderately Isolated (02/15/2021)   Social Connection and Isolation Panel [NHANES]    Frequency of Communication with Friends and Family: Three times a week    Frequency of Social Gatherings with Friends and Family: Three times a week    Attends Religious Services: More than 4 times per year    Active Member of Clubs or Organizations: No    Attends Banker Meetings: Never    Marital Status: Never married  Intimate Partner Violence: Unknown (04/20/2022)   Humiliation, Afraid, Rape, and Kick questionnaire    Fear of Current or Ex-Partner: Patient declined    Emotionally Abused: Patient declined    Physically Abused: Not on file    Sexually Abused: Not on file   Family History  Problem Relation Age of Onset   Asthma Mother    Diabetes Father    Hypertension Father    Kidney disease Father    Heart attack Father    Heart failure Father    Hypertension Sister    Asthma Sister    Congestive Heart Failure Maternal Aunt    Diabetes Maternal Grandmother    Congestive Heart Failure Maternal Grandmother    Lung disease Maternal Grandmother    Asthma Other    Hyperlipidemia Other    Hypertension Other    Cancer Other     Current Facility-Administered Medications  Medication Dose Route Frequency Provider Last Rate Last Admin   0.9 %  sodium chloride infusion   Intravenous Continuous Victorino Sparrow, MD       ceFAZolin (ANCEF) IVPB 2g/100 mL premix  2 g Intravenous 30 min Pre-Op Victorino Sparrow, MD       chlorhexidine (HIBICLENS) 4 % liquid 4 Application  60 mL Topical Once Victorino Sparrow, MD       And   [START ON 04/05/2023] chlorhexidine (HIBICLENS) 4 %  liquid 4 Application  60 mL Topical Once Victorino Sparrow, MD       insulin aspart (novoLOG) 100 UNIT/ML injection            insulin aspart (novoLOG) injection 0-7 Units  0-7 Units Subcutaneous Q2H PRN Trevor Iha, MD   2 Units at 04/04/23 0631    Allergies  Allergen Reactions   Bee Pollen Other (See Comments)    Seasonal Allergies  Dust Mite Extract Cough    Sneezing & Cough   Mixed Ragweed Itching     REVIEW OF SYSTEMS:  [X]  denotes positive finding, [ ]  denotes negative finding Cardiac  Comments:  Chest pain or chest pressure:    Shortness of breath upon exertion:    Short of breath when lying flat:    Irregular heart rhythm:        Vascular    Pain in calf, thigh, or hip brought on by ambulation:    Pain in feet at night that wakes you up from your sleep:     Blood clot in your veins:    Leg swelling:         Pulmonary    Oxygen at home:    Productive cough:     Wheezing:         Neurologic    Sudden weakness in arms or legs:     Sudden numbness in arms or legs:     Sudden onset of difficulty speaking or slurred speech:    Temporary loss of vision in one eye:     Problems with dizziness:         Gastrointestinal    Blood in stool:     Vomited blood:         Genitourinary    Burning when urinating:     Blood in urine:        Psychiatric    Major depression:         Hematologic    Bleeding problems:    Problems with blood clotting too easily:        Skin    Rashes or ulcers:        Constitutional    Fever or chills:      PHYSICAL EXAMINATION:  Vitals:   04/04/23 0550  BP: (!) 147/76  Pulse: 92  Resp: 18  Temp: 98 F (36.7 C)  TempSrc: Oral  SpO2: 94%  Weight: 111.6 kg  Height: 5\' 9"  (1.753 m)    General:  WDWN in NAD; vital signs documented above Gait: Not observed HENT: WNL, normocephalic Pulmonary: normal non-labored breathing , without Rales, rhonchi,  wheezing Cardiac: regular H Abdomen: soft, NT, no masses Skin:  without rashes Vascular Exam/Pulses:  Right Left  Radial 2+ (normal) 2+ (normal)  Ulnar    Femoral    Popliteal    DP 2+ (normal) 2+ (normal)       Extremities: without ischemic changes, without Gangrene , without cellulitis; without open wounds;  Musculoskeletal: no muscle wasting or atrophy  Neurologic: A&O X 3;  No focal weakness or paresthesias are detected Psychiatric:  The pt has Normal affect.   Non-Invasive Vascular Imaging:   See studies    ASSESSMENT/PLAN:  [name] is a 52 y.o. female who presents with chronic kidney disease stage 5  Based on vein mapping and examination, patient has a suitable cephalic vein in the right upper extremity for access. Plan for right-sided arteriovenous fistula creation, with possible need in the future for superficialization. I had an extensive discussion with this patient in regards to the nature of access surgery, including risk, benefits, and alternatives.   The patient is aware that the risks of access surgery include but are not limited to: bleeding, infection, steal syndrome, nerve damage, ischemic monomelic neuropathy, failure of access to mature, complications related to venous hypertension, and possible need for additional access procedures in the future.  I discussed with the patient  the nature of the staged access procedure, specifically the need for a second operation to transpose the first stage fistula if it matures adequately.   The patient has agreed to proceed with the above procedure which will be scheduled at her convenience.  Victorino Sparrow, MD [facility] [phone]

## 2023-04-04 NOTE — Telephone Encounter (Signed)
Pt currently in hospital.

## 2023-04-04 NOTE — Anesthesia Procedure Notes (Signed)
Anesthesia Regional Block: Interscalene brachial plexus block   Pre-Anesthetic Checklist: , timeout performed,  Correct Patient, Correct Site, Correct Laterality,  Correct Procedure, Correct Position, site marked,  Risks and benefits discussed,  Surgical consent,  Pre-op evaluation,  At surgeon's request and post-op pain management  Laterality: Upper and Right  Prep: Maximum Sterile Barrier Precautions used, chloraprep       Needles:  Injection technique: Single-shot  Needle Type: Echogenic Needle     Needle Length: 5cm  Needle Gauge: 21     Additional Needles:   Procedures:,,,, ultrasound used (permanent image in chart),,    Narrative:  Start time: 04/04/2023 7:20 AM End time: 04/04/2023 7:26 AM Injection made incrementally with aspirations every 5 mL.  Performed by: Personally  Anesthesiologist: Trevor Iha, MD  Additional Notes: Block assessed prior to procedure. Patient tolerated procedure well.

## 2023-04-04 NOTE — Progress Notes (Signed)
Dr. Richardson Landry made aware of hemoglobin 8.5 from today's ISTAT result. No new orders received.

## 2023-04-04 NOTE — Transfer of Care (Signed)
Immediate Anesthesia Transfer of Care Note  Patient: [name]  Procedure(s) Performed: RIGHT ARM BRACHIOCEPHALIC ARTERIOVENOUS (AV) FISTULA CREATION (Right: Arm Lower)  Patient Location: PACU  Anesthesia Type:MAC and Regional  Level of Consciousness: awake  Airway & Oxygen Therapy: Patient connected to face mask oxygen  Post-op Assessment: Report given to RN and Post -op Vital signs reviewed and stable  Post vital signs: Reviewed and stable  Last Vitals:  Vitals Value Taken Time  BP 148/67 04/04/23 0845  Temp    Pulse 85 04/04/23 0848  Resp 20 04/04/23 0848  SpO2 92 % 04/04/23 0848  Vitals shown include unfiled device data.  Last Pain:  Vitals:   04/04/23 0605  TempSrc:   PainSc: 0-No pain      Patients Stated Pain Goal: 0 (04/04/23 6578)  Complications: No notable events documented.

## 2023-04-04 NOTE — Anesthesia Postprocedure Evaluation (Signed)
Anesthesia Post Note  Patient: CRIS HOLVERSON  Procedure(s) Performed: RIGHT ARM BRACHIOCEPHALIC ARTERIOVENOUS (AV) FISTULA CREATION (Right: Arm Lower)     Patient location during evaluation: PACU Anesthesia Type: Regional Level of consciousness: awake and alert Pain management: pain level controlled Vital Signs Assessment: post-procedure vital signs reviewed and stable Respiratory status: spontaneous breathing, nonlabored ventilation, respiratory function stable and patient connected to nasal cannula oxygen Cardiovascular status: stable and blood pressure returned to baseline Postop Assessment: no apparent nausea or vomiting Anesthetic complications: no   No notable events documented.  Last Vitals:  Vitals:   04/04/23 1115 04/04/23 1130  BP: (!) 150/71 (!) 157/71  Pulse: 77 77  Resp: 15 18  Temp:  (!) 36.3 C  SpO2: (!) 87% 90%    Last Pain:  Vitals:   04/04/23 1130  TempSrc:   PainSc: 0-No pain                 Trevor Iha

## 2023-04-04 NOTE — Telephone Encounter (Signed)
-----   Message from Mosetta Pigeon sent at 04/04/2023  8:41 AM EDT ----- S/P right BC AV fistula creation f/u in pa clinic in 5-6 weeks with duplex

## 2023-04-05 ENCOUNTER — Ambulatory Visit: Payer: Medicaid Other | Admitting: Vascular Surgery

## 2023-04-05 ENCOUNTER — Encounter (HOSPITAL_COMMUNITY): Payer: Self-pay | Admitting: Vascular Surgery

## 2023-04-05 ENCOUNTER — Encounter (HOSPITAL_COMMUNITY): Payer: Medicaid Other

## 2023-04-05 ENCOUNTER — Other Ambulatory Visit (HOSPITAL_COMMUNITY): Payer: Medicaid Other

## 2023-04-12 ENCOUNTER — Encounter (HOSPITAL_COMMUNITY): Payer: Self-pay

## 2023-04-12 ENCOUNTER — Emergency Department (HOSPITAL_COMMUNITY): Payer: Medicaid Other

## 2023-04-12 ENCOUNTER — Other Ambulatory Visit: Payer: Self-pay

## 2023-04-12 ENCOUNTER — Inpatient Hospital Stay (HOSPITAL_COMMUNITY)
Admission: EM | Admit: 2023-04-12 | Discharge: 2023-04-22 | DRG: 291 | Disposition: A | Discharge status: Home / Self Care | Payer: Medicaid Other | Source: Ambulatory Visit | Attending: Internal Medicine | Admitting: Internal Medicine

## 2023-04-12 DIAGNOSIS — D631 Anemia in chronic kidney disease: Secondary | ICD-10-CM | POA: Diagnosis present

## 2023-04-12 DIAGNOSIS — Z91148 Patient's other noncompliance with medication regimen for other reason: Secondary | ICD-10-CM

## 2023-04-12 DIAGNOSIS — E113293 Type 2 diabetes mellitus with mild nonproliferative diabetic retinopathy without macular edema, bilateral: Secondary | ICD-10-CM | POA: Diagnosis present

## 2023-04-12 DIAGNOSIS — I2510 Atherosclerotic heart disease of native coronary artery without angina pectoris: Secondary | ICD-10-CM | POA: Diagnosis present

## 2023-04-12 DIAGNOSIS — N186 End stage renal disease: Secondary | ICD-10-CM | POA: Diagnosis present

## 2023-04-12 DIAGNOSIS — D638 Anemia in other chronic diseases classified elsewhere: Secondary | ICD-10-CM | POA: Diagnosis not present

## 2023-04-12 DIAGNOSIS — N185 Chronic kidney disease, stage 5: Secondary | ICD-10-CM | POA: Diagnosis not present

## 2023-04-12 DIAGNOSIS — I132 Hypertensive heart and chronic kidney disease with heart failure and with stage 5 chronic kidney disease, or end stage renal disease: Secondary | ICD-10-CM | POA: Diagnosis not present

## 2023-04-12 DIAGNOSIS — G4733 Obstructive sleep apnea (adult) (pediatric): Secondary | ICD-10-CM | POA: Diagnosis present

## 2023-04-12 DIAGNOSIS — Z79899 Other long term (current) drug therapy: Secondary | ICD-10-CM | POA: Diagnosis not present

## 2023-04-12 DIAGNOSIS — D509 Iron deficiency anemia, unspecified: Secondary | ICD-10-CM | POA: Diagnosis present

## 2023-04-12 DIAGNOSIS — E876 Hypokalemia: Secondary | ICD-10-CM | POA: Diagnosis present

## 2023-04-12 DIAGNOSIS — R9431 Abnormal electrocardiogram [ECG] [EKG]: Secondary | ICD-10-CM | POA: Diagnosis not present

## 2023-04-12 DIAGNOSIS — Z7985 Long-term (current) use of injectable non-insulin antidiabetic drugs: Secondary | ICD-10-CM

## 2023-04-12 DIAGNOSIS — Z992 Dependence on renal dialysis: Secondary | ICD-10-CM | POA: Diagnosis not present

## 2023-04-12 DIAGNOSIS — N189 Chronic kidney disease, unspecified: Principal | ICD-10-CM

## 2023-04-12 DIAGNOSIS — Z1152 Encounter for screening for COVID-19: Secondary | ICD-10-CM | POA: Diagnosis not present

## 2023-04-12 DIAGNOSIS — E785 Hyperlipidemia, unspecified: Secondary | ICD-10-CM | POA: Diagnosis present

## 2023-04-12 DIAGNOSIS — J45909 Unspecified asthma, uncomplicated: Secondary | ICD-10-CM | POA: Diagnosis present

## 2023-04-12 DIAGNOSIS — I10 Essential (primary) hypertension: Secondary | ICD-10-CM | POA: Diagnosis not present

## 2023-04-12 DIAGNOSIS — N184 Chronic kidney disease, stage 4 (severe): Secondary | ICD-10-CM | POA: Diagnosis present

## 2023-04-12 DIAGNOSIS — Z8249 Family history of ischemic heart disease and other diseases of the circulatory system: Secondary | ICD-10-CM

## 2023-04-12 DIAGNOSIS — I3139 Other pericardial effusion (noninflammatory): Secondary | ICD-10-CM | POA: Diagnosis present

## 2023-04-12 DIAGNOSIS — Z8673 Personal history of transient ischemic attack (TIA), and cerebral infarction without residual deficits: Secondary | ICD-10-CM

## 2023-04-12 DIAGNOSIS — Z9109 Other allergy status, other than to drugs and biological substances: Secondary | ICD-10-CM

## 2023-04-12 DIAGNOSIS — M1990 Unspecified osteoarthritis, unspecified site: Secondary | ICD-10-CM | POA: Diagnosis present

## 2023-04-12 DIAGNOSIS — E66812 Obesity, class 2: Secondary | ICD-10-CM

## 2023-04-12 DIAGNOSIS — E8889 Other specified metabolic disorders: Secondary | ICD-10-CM | POA: Diagnosis present

## 2023-04-12 DIAGNOSIS — H35033 Hypertensive retinopathy, bilateral: Secondary | ICD-10-CM | POA: Diagnosis present

## 2023-04-12 DIAGNOSIS — Z7982 Long term (current) use of aspirin: Secondary | ICD-10-CM

## 2023-04-12 DIAGNOSIS — Z833 Family history of diabetes mellitus: Secondary | ICD-10-CM

## 2023-04-12 DIAGNOSIS — Z89421 Acquired absence of other right toe(s): Secondary | ICD-10-CM

## 2023-04-12 DIAGNOSIS — Z794 Long term (current) use of insulin: Secondary | ICD-10-CM | POA: Diagnosis not present

## 2023-04-12 DIAGNOSIS — I509 Heart failure, unspecified: Secondary | ICD-10-CM | POA: Insufficient documentation

## 2023-04-12 DIAGNOSIS — E1122 Type 2 diabetes mellitus with diabetic chronic kidney disease: Secondary | ICD-10-CM | POA: Diagnosis present

## 2023-04-12 DIAGNOSIS — R0602 Shortness of breath: Secondary | ICD-10-CM | POA: Diagnosis present

## 2023-04-12 DIAGNOSIS — E1169 Type 2 diabetes mellitus with other specified complication: Secondary | ICD-10-CM | POA: Insufficient documentation

## 2023-04-12 DIAGNOSIS — E872 Acidosis, unspecified: Secondary | ICD-10-CM | POA: Diagnosis present

## 2023-04-12 DIAGNOSIS — N179 Acute kidney failure, unspecified: Secondary | ICD-10-CM | POA: Diagnosis present

## 2023-04-12 DIAGNOSIS — E669 Obesity, unspecified: Secondary | ICD-10-CM | POA: Diagnosis present

## 2023-04-12 DIAGNOSIS — Z560 Unemployment, unspecified: Secondary | ICD-10-CM

## 2023-04-12 DIAGNOSIS — Z91030 Bee allergy status: Secondary | ICD-10-CM

## 2023-04-12 DIAGNOSIS — Z825 Family history of asthma and other chronic lower respiratory diseases: Secondary | ICD-10-CM

## 2023-04-12 DIAGNOSIS — E782 Mixed hyperlipidemia: Secondary | ICD-10-CM | POA: Diagnosis present

## 2023-04-12 DIAGNOSIS — Z87891 Personal history of nicotine dependence: Secondary | ICD-10-CM

## 2023-04-12 DIAGNOSIS — Z841 Family history of disorders of kidney and ureter: Secondary | ICD-10-CM

## 2023-04-12 DIAGNOSIS — I5033 Acute on chronic diastolic (congestive) heart failure: Secondary | ICD-10-CM | POA: Diagnosis present

## 2023-04-12 DIAGNOSIS — Z6836 Body mass index (BMI) 36.0-36.9, adult: Secondary | ICD-10-CM

## 2023-04-12 LAB — RESP PANEL BY RT-PCR (RSV, FLU A&B, COVID)  RVPGX2
Influenza A by PCR: NEGATIVE
Influenza B by PCR: NEGATIVE
Resp Syncytial Virus by PCR: NEGATIVE
SARS Coronavirus 2 by RT PCR: NEGATIVE

## 2023-04-12 LAB — ECHOCARDIOGRAM COMPLETE
AR max vel: 1.82 cm2
AV Area VTI: 2.16 cm2
AV Area mean vel: 1.9 cm2
AV Mean grad: 7 mmHg
AV Peak grad: 13.7 mmHg
Ao pk vel: 1.85 m/s
Area-P 1/2: 3.66 cm2
Height: 69 in
S' Lateral: 3.2 cm
Weight: 3936 [oz_av]

## 2023-04-12 LAB — COMPREHENSIVE METABOLIC PANEL
ALT: 12 U/L (ref 0–44)
AST: 16 U/L (ref 15–41)
Albumin: 3.1 g/dL — ABNORMAL LOW (ref 3.5–5.0)
Alkaline Phosphatase: 56 U/L (ref 38–126)
Anion gap: 15 (ref 5–15)
BUN: 46 mg/dL — ABNORMAL HIGH (ref 6–20)
CO2: 19 mmol/L — ABNORMAL LOW (ref 22–32)
Calcium: 8 mg/dL — ABNORMAL LOW (ref 8.9–10.3)
Chloride: 106 mmol/L (ref 98–111)
Creatinine, Ser: 4.58 mg/dL — ABNORMAL HIGH (ref 0.44–1.00)
GFR, Estimated: 11 mL/min — ABNORMAL LOW (ref >60)
Glucose, Bld: 102 mg/dL — ABNORMAL HIGH (ref 70–99)
Potassium: 3.3 mmol/L — ABNORMAL LOW (ref 3.5–5.1)
Sodium: 140 mmol/L (ref 135–145)
Total Bilirubin: 0.6 mg/dL (ref 0.3–1.2)
Total Protein: 6.9 g/dL (ref 6.5–8.1)

## 2023-04-12 LAB — CBC
HCT: 24.9 % — ABNORMAL LOW (ref 36.0–46.0)
Hemoglobin: 8.2 g/dL — ABNORMAL LOW (ref 12.0–15.0)
MCH: 30.5 pg (ref 26.0–34.0)
MCHC: 32.9 g/dL (ref 30.0–36.0)
MCV: 92.6 fL (ref 80.0–100.0)
Platelets: 215 10*3/uL (ref 150–400)
RBC: 2.69 MIL/uL — ABNORMAL LOW (ref 3.87–5.11)
RDW: 15.2 % (ref 11.5–15.5)
WBC: 7.2 10*3/uL (ref 4.0–10.5)
nRBC: 0 % (ref 0.0–0.2)

## 2023-04-12 LAB — BRAIN NATRIURETIC PEPTIDE: B Natriuretic Peptide: 494.9 pg/mL — ABNORMAL HIGH (ref 0.0–100.0)

## 2023-04-12 LAB — HIV ANTIBODY (ROUTINE TESTING W REFLEX): HIV Screen 4th Generation wRfx: NONREACTIVE

## 2023-04-12 LAB — GLUCOSE, CAPILLARY: Glucose-Capillary: 158 mg/dL — ABNORMAL HIGH (ref 70–99)

## 2023-04-12 MED ORDER — LABETALOL HCL 200 MG PO TABS
300.0000 mg | ORAL_TABLET | Freq: Two times a day (BID) | ORAL | Status: DC
  Administered 2023-04-12 – 2023-04-22 (×20): 300 mg via ORAL
  Filled 2023-04-12 (×20): qty 1

## 2023-04-12 MED ORDER — SODIUM CHLORIDE 0.9% FLUSH
3.0000 mL | Freq: Two times a day (BID) | INTRAVENOUS | Status: DC
  Administered 2023-04-12 – 2023-04-22 (×16): 3 mL via INTRAVENOUS

## 2023-04-12 MED ORDER — POTASSIUM CHLORIDE CRYS ER 20 MEQ PO TBCR
40.0000 meq | EXTENDED_RELEASE_TABLET | ORAL | Status: AC
  Administered 2023-04-13: 40 meq via ORAL
  Filled 2023-04-12: qty 2

## 2023-04-12 MED ORDER — ASPIRIN 81 MG PO TBEC
81.0000 mg | DELAYED_RELEASE_TABLET | Freq: Every day | ORAL | Status: DC
  Administered 2023-04-13 – 2023-04-22 (×10): 81 mg via ORAL
  Filled 2023-04-12 (×10): qty 1

## 2023-04-12 MED ORDER — ACETAMINOPHEN 650 MG RE SUPP: 650.0000 mg | Freq: Four times a day (QID) | RECTAL | Status: DC | PRN

## 2023-04-12 MED ORDER — AMLODIPINE BESYLATE 10 MG PO TABS
10.0000 mg | ORAL_TABLET | Freq: Every day | ORAL | Status: DC
  Administered 2023-04-13 – 2023-04-15 (×3): 10 mg via ORAL
  Filled 2023-04-12 (×3): qty 1

## 2023-04-12 MED ORDER — ALBUTEROL SULFATE (2.5 MG/3ML) 0.083% IN NEBU: 2.5000 mg | INHALATION_SOLUTION | RESPIRATORY_TRACT | Status: DC | PRN

## 2023-04-12 MED ORDER — INSULIN ASPART PROT & ASPART (70-30 MIX) 100 UNIT/ML ~~LOC~~ SUSP
20.0000 [IU] | Freq: Two times a day (BID) | SUBCUTANEOUS | Status: DC
  Administered 2023-04-13 – 2023-04-14 (×3): 20 [IU] via SUBCUTANEOUS
  Filled 2023-04-12: qty 10

## 2023-04-12 MED ORDER — CALCITRIOL 0.25 MCG PO CAPS
0.2500 ug | ORAL_CAPSULE | Freq: Every day | ORAL | Status: DC
  Administered 2023-04-13 – 2023-04-22 (×10): 0.25 ug via ORAL
  Filled 2023-04-12 (×10): qty 1

## 2023-04-12 MED ORDER — INSULIN ASPART 100 UNIT/ML IJ SOLN
0.0000 [IU] | Freq: Every day | INTRAMUSCULAR | Status: DC
  Administered 2023-04-19: 2 [IU] via SUBCUTANEOUS

## 2023-04-12 MED ORDER — ACETAMINOPHEN 325 MG PO TABS
650.0000 mg | ORAL_TABLET | Freq: Four times a day (QID) | ORAL | Status: DC | PRN
  Administered 2023-04-20 – 2023-04-22 (×3): 650 mg via ORAL
  Filled 2023-04-12 (×3): qty 2

## 2023-04-12 MED ORDER — FUROSEMIDE 10 MG/ML IJ SOLN
40.0000 mg | Freq: Two times a day (BID) | INTRAMUSCULAR | Status: DC
  Administered 2023-04-13: 40 mg via INTRAVENOUS
  Filled 2023-04-12: qty 4

## 2023-04-12 MED ORDER — HEPARIN SODIUM (PORCINE) 5000 UNIT/ML IJ SOLN
5000.0000 [IU] | Freq: Three times a day (TID) | INTRAMUSCULAR | Status: DC
  Administered 2023-04-12 – 2023-04-22 (×25): 5000 [IU] via SUBCUTANEOUS
  Filled 2023-04-12 (×25): qty 1

## 2023-04-12 MED ORDER — INSULIN ASPART 100 UNIT/ML IJ SOLN
0.0000 [IU] | Freq: Three times a day (TID) | INTRAMUSCULAR | Status: DC
  Administered 2023-04-13: 1 [IU] via SUBCUTANEOUS
  Administered 2023-04-13: 2 [IU] via SUBCUTANEOUS
  Administered 2023-04-14 (×2): 1 [IU] via SUBCUTANEOUS
  Administered 2023-04-15: 2 [IU] via SUBCUTANEOUS
  Administered 2023-04-15 – 2023-04-17 (×6): 1 [IU] via SUBCUTANEOUS
  Administered 2023-04-17 (×2): 3 [IU] via SUBCUTANEOUS
  Administered 2023-04-18: 2 [IU] via SUBCUTANEOUS
  Administered 2023-04-18: 1 [IU] via SUBCUTANEOUS
  Administered 2023-04-19: 2 [IU] via SUBCUTANEOUS
  Administered 2023-04-19 – 2023-04-20 (×2): 1 [IU] via SUBCUTANEOUS
  Administered 2023-04-20 – 2023-04-21 (×2): 2 [IU] via SUBCUTANEOUS
  Administered 2023-04-21 – 2023-04-22 (×2): 1 [IU] via SUBCUTANEOUS

## 2023-04-12 MED ORDER — ISOSORB DINITRATE-HYDRALAZINE 20-37.5 MG PO TABS
2.0000 | ORAL_TABLET | Freq: Three times a day (TID) | ORAL | Status: DC
  Administered 2023-04-12 – 2023-04-15 (×8): 2 via ORAL
  Filled 2023-04-12 (×8): qty 2

## 2023-04-12 MED ORDER — ROSUVASTATIN CALCIUM 20 MG PO TABS
40.0000 mg | ORAL_TABLET | Freq: Every morning | ORAL | Status: DC
  Administered 2023-04-13 – 2023-04-22 (×9): 40 mg via ORAL
  Filled 2023-04-12 (×10): qty 2

## 2023-04-12 MED ORDER — FUROSEMIDE 10 MG/ML IJ SOLN
80.0000 mg | Freq: Once | INTRAMUSCULAR | Status: AC
  Administered 2023-04-12: 80 mg via INTRAVENOUS
  Filled 2023-04-12: qty 8

## 2023-04-12 NOTE — ED Notes (Signed)
ED TO INPATIENT HANDOFF REPORT  ED Nurse Name and Phone #: [phone]  S Name/Age/Gender [name] 52 y.o. female Room/Bed: 027C/027C  Code Status   Code Status: Prior  Home/SNF/Other Home Patient oriented to: self, place, time, and situation Is this baseline? Yes   Triage Complete: Triage complete  Chief Complaint Acute exacerbation of CHF (congestive heart failure) (HCC) [I50.9]  Triage Note Patient recently had a fistula placed on 8/27 and has had increase SOB.  Reports having to stay on CPAP per PA note.    Allergies Allergies  Allergen Reactions   Bee Pollen Other (See Comments)    Seasonal Allergies   Dust Mite Extract Cough    Sneezing & Cough   Mixed Ragweed Itching    Level of Care/Admitting Diagnosis ED Disposition     ED Disposition  Admit   Condition  --   Comment  Hospital Area: [facility] [[id number]]  Level of Care: Telemetry Medical [104]  May admit patient to [facility] or [facility] if equivalent level of care is available:: No  Covid Evaluation: Asymptomatic - no recent exposure (last 10 days) testing not required  Diagnosis: Acute exacerbation of CHF (congestive heart failure) [facility]) [[id number]]  Admitting Physician: Clydie Braun [[id number]]  Attending Physician: Clydie Braun [[id number]]  Certification:: I certify this patient will need inpatient services for at least 2 midnights  Expected Medical Readiness: 04/14/2023          B Medical/Surgery History Past Medical History:  Diagnosis Date   Anemia    Anxiety    Arthritis    Asthma    Cataract    Mixed form OU   CKD (chronic kidney disease)    Coronary artery disease    Depression    Diabetes mellitus    Diabetic retinopathy (HCC)    NPDR OU   Dyspnea    Hyperlipidemia    Hypertension    Hypertensive retinopathy    OU   Left thyroid nodule    diagnosed 07/2018   PAC (premature atrial contraction) 02/15/2021   Pseudotumor cerebri    Sleep apnea     Uses a cpap   Stroke (HCC)    Vitamin D deficiency    Past Surgical History:  Procedure Laterality Date   ACHILLES TENDON REPAIR Right    ACHILLES TENDON SURGERY Left 02/19/2021   Procedure: ACHILLES TENDON REPAIR WITH GRAFT;  Surgeon: Candelaria Stagers, DPM;  Location: [facility];  Service: Podiatry;  Laterality: Left;  BLOCK   AMPUTATION TOE Right 05/28/2021   Procedure: AMPUTATION TOE;  Surgeon: Louann Sjogren, MD;  Location: [facility] OR;  Service: Podiatry;  Laterality: Right;  Surgical team will do block   AV FISTULA PLACEMENT Left 07/29/2022   Procedure: LEFT ARTERIOVENOUS (AV) FISTULA CREATION;  Surgeon: Maeola Harman, MD;  Location[facility];  Service: Vascular;  Laterality: Left;   AV FISTULA PLACEMENT Right 04/04/2023   Procedure: RIGHT ARM BRACHIOCEPHALIC ARTERIOVENOUS (AV) FISTULA CREATION;  Surgeon: Victorino Sparrow, MD;  Location: [facility] OR;  Service: Vascular;  Laterality: Right;  With regional block   GASTROC RECESSION EXTREMITY Left 02/19/2021   Procedure: GASTROC RECESSION EXTREMITY;  Surgeon: Candelaria Stagers, DPM;  Location: [facility];  Service: Podiatry;  Laterality: Left;  Block   TOE SURGERY Left 01/2023   TUBAL LIGATION       A IV Location/Drains/Wounds Patient Lines/Drains/Airways Status     Active Line/Drains/Airways  Name Placement date Placement time Site Days   Fistula / Graft Left Upper arm Arteriovenous fistula 07/29/22  1107  Upper arm  257   Fistula / Graft Right Upper arm Arteriovenous fistula 04/04/23  0807  Upper arm  8            Intake/Output Last 24 hours No intake or output data in the 24 hours ending 04/12/23 1654  Labs/Imaging Results for orders placed or performed during the hospital encounter of 04/12/23 (from the past 48 hour(s))  Resp panel by RT-PCR (RSV, Flu A&B, Covid) Anterior Nasal Swab     Status: None   Collection Time: 04/12/23  2:35 PM   Specimen: Anterior Nasal Swab  Result Value Ref Range    SARS Coronavirus 2 by RT PCR NEGATIVE NEGATIVE   Influenza A by PCR NEGATIVE NEGATIVE   Influenza B by PCR NEGATIVE NEGATIVE    Comment: (NOTE) The Xpert Xpress SARS-CoV-2/FLU/RSV plus assay is intended as an aid in the diagnosis of influenza from Nasopharyngeal swab specimens and should not be used as a sole basis for treatment. Nasal washings and aspirates are unacceptable for Xpert Xpress SARS-CoV-2/FLU/RSV testing.  Fact Sheet for Patients: BloggerCourse.com  Fact Sheet for Healthcare Providers: SeriousBroker.it  This test is not yet approved or cleared by the [place] FDA and has been authorized for detection and/or diagnosis of SARS-CoV-2 by FDA under an Emergency Use Authorization (EUA). This EUA will remain in effect (meaning this test can be used) for the duration of the COVID-19 declaration under Section 564(b)(1) of the Act, 21 U.S.C. section 360bbb-3(b)(1), unless the authorization is terminated or revoked.     Resp Syncytial Virus by PCR NEGATIVE NEGATIVE    Comment: (NOTE) Fact Sheet for Patients: BloggerCourse.com  Fact Sheet for Healthcare Providers: SeriousBroker.it  This test is not yet approved or cleared by the [place] FDA and has been authorized for detection and/or diagnosis of SARS-CoV-2 by FDA under an Emergency Use Authorization (EUA). This EUA will remain in effect (meaning this test can be used) for the duration of the COVID-19 declaration under Section 564(b)(1) of the Act, 21 U.S.C. section 360bbb-3(b)(1), unless the authorization is terminated or revoked.  Performed at [facility], [address] [place], [place] [address]   Comprehensive metabolic panel     Status: Abnormal   Collection Time: 04/12/23  2:35 PM  Result Value Ref Range   Sodium 140 135 - 145 mmol/L   Potassium 3.3 (L) 3.5 - 5.1 mmol/L   Chloride 106 98 -  111 mmol/L   CO2 19 (L) 22 - 32 mmol/L   Glucose, Bld 102 (H) 70 - 99 mg/dL    Comment: Glucose reference range applies only to samples taken after fasting for at least 8 hours.   BUN 46 (H) 6 - 20 mg/dL   Creatinine, Ser 6.04 (H) 0.44 - 1.00 mg/dL   Calcium 8.0 (L) 8.9 - 10.3 mg/dL   Total Protein 6.9 6.5 - 8.1 g/dL   Albumin 3.1 (L) 3.5 - 5.0 g/dL   AST 16 15 - 41 U/L   ALT 12 0 - 44 U/L   Alkaline Phosphatase 56 38 - 126 U/L   Total Bilirubin 0.6 0.3 - 1.2 mg/dL   GFR, Estimated 11 (L) >60 mL/min    Comment: (NOTE) Calculated using the CKD-EPI Creatinine Equation (2021)    Anion gap 15 5 - 15    Comment: Performed at [facility], [address] [place], [place] [address]  CBC     Status: Abnormal   Collection Time: 04/12/23  2:35 PM  Result Value Ref Range   WBC 7.2 4.0 - 10.5 K/uL   RBC 2.69 (L) 3.87 - 5.11 MIL/uL   Hemoglobin 8.2 (L) 12.0 - 15.0 g/dL   HCT 62.1 (L) 30.8 - 65.7 %   MCV 92.6 80.0 - 100.0 fL   MCH 30.5 26.0 - 34.0 pg   MCHC 32.9 30.0 - 36.0 g/dL   RDW 84.6 96.2 - 95.2 %   Platelets 215 150 - 400 K/uL   nRBC 0.0 0.0 - 0.2 %    Comment: Performed at [facility], [address], [place], [place] [address]  Brain natriuretic peptide     Status: Abnormal   Collection Time: 04/12/23  2:36 PM  Result Value Ref Range   B Natriuretic Peptide 494.9 (H) 0.0 - 100.0 pg/mL    Comment: Performed at [facility], [address] [place], [place] [address]   ECHOCARDIOGRAM COMPLETE  Result Date: 04/12/2023    ECHOCARDIOGRAM REPORT   Patient Name:   NAJMAH GASTELUM Date of Exam: 04/12/2023 Medical Rec #:  [id number]       Height:       69.0 in Accession #:    [id number]      Weight:       246.0 lb Date of Birth:  28-Nov-1970      BSA:          2.256 m Patient Age:    51 years        BP:           138/71 mmHg Patient Gender: F               HR:           80 bpm. Exam Location:  Inpatient Procedure: 2D Echo, Color Doppler and Cardiac Doppler Indications:      Pericardial Effusion  History:         Patient has prior history of Echocardiogram examinations, most                  recent 06/25/2020. Signs/Symptoms:Dyspnea.  Sonographer:     Darlys Gales Referring Phys:  [id number] SUNIT TOLIA Diagnosing Phys: Tessa Lerner DO IMPRESSIONS  1. Left ventricular ejection fraction, by estimation, is 60 to 65%. The left ventricle has normal function. The left ventricle has no regional wall motion abnormalities. There is severe left ventricular hypertrophy. Left ventricular diastolic parameters  are indeterminate. Elevated left atrial pressure.  2. Right ventricular systolic function is low normal. The right ventricular size is normal.  3. A small pericardial effusion is present. The pericardial effusion is posterior to the left ventricle. There is no evidence of cardiac tamponade.  4. The mitral valve is grossly normal. Mild mitral valve regurgitation. No evidence of mitral stenosis.  5. The aortic valve is tricuspid. There is mild calcification of the aortic valve. There is mild thickening of the aortic valve. Aortic valve regurgitation is mild. Aortic valve sclerosis/calcification is present, without any evidence of aortic stenosis.  6. The inferior vena cava is dilated in size with >50% respiratory variability, suggesting right atrial pressure of 8 mmHg. Comparison(s): Prior 07/27/2021: LVEF 55%, severe LVH, LVOT peak gradient , indeterminate diasotlic filling pattern, moderate AR, mild MR, mild TR, mild PR. FINDINGS  Left Ventricle: Left ventricular ejection fraction, by estimation, is 60 to 65%. The left ventricle  has normal function. The left ventricle has no regional wall motion abnormalities. The left ventricular internal cavity size was normal in size. There is  severe left ventricular hypertrophy. Left ventricular diastolic parameters are indeterminate. Elevated left atrial pressure. Right Ventricle: The right ventricular size is normal. No increase in right ventricular  wall thickness. Right ventricular systolic function is low normal. Left Atrium: Left atrial size was normal in size. Right Atrium: Right atrial size was normal in size. Pericardium: A small pericardial effusion is present. The pericardial effusion is posterior to the left ventricle. There is no evidence of cardiac tamponade. Mitral Valve: The mitral valve is grossly normal. Mild mitral valve regurgitation. No evidence of mitral valve stenosis. Tricuspid Valve: The tricuspid valve is normal in structure. Tricuspid valve regurgitation is not demonstrated. No evidence of tricuspid stenosis. Aortic Valve: The aortic valve is tricuspid. There is mild calcification of the aortic valve. There is mild thickening of the aortic valve. There is mild aortic valve annular calcification. Aortic valve regurgitation is mild. Aortic valve sclerosis/calcification is present, without any evidence of aortic stenosis. Aortic valve mean gradient measures 7.0 mmHg. Aortic valve peak gradient measures 13.7 mmHg. Aortic valve area, by VTI measures 2.16 cm. Pulmonic Valve: The pulmonic valve was not well visualized. Pulmonic valve regurgitation is mild. No evidence of pulmonic stenosis. Aorta: The aortic root and ascending aorta are structurally normal, with no evidence of dilitation. Venous: The inferior vena cava is dilated in size with greater than 50% respiratory variability, suggesting right atrial pressure of 8 mmHg. IAS/Shunts: The interatrial septum was not well visualized.  LEFT VENTRICLE PLAX 2D LVIDd:         4.90 cm   Diastology LVIDs:         3.20 cm   LV e' medial:    6.09 cm/s LV PW:         1.50 cm   LV E/e' medial:  16.7 LV IVS:        1.60 cm   LV e' lateral:   7.18 cm/s LVOT diam:     1.70 cm   LV E/e' lateral: 14.2 LV SV:         82 LV SV Index:   36 LVOT Area:     2.27 cm  RIGHT VENTRICLE RV S prime:     9.90 cm/s TAPSE (M-mode): 1.9 cm LEFT ATRIUM             Index        RIGHT ATRIUM           Index LA Vol (A2C):    42.6 ml 18.88 ml/m  RA Area:     13.90 cm LA Vol (A4C):   79.6 ml 35.28 ml/m  RA Volume:   27.80 ml  12.32 ml/m LA Biplane Vol: 61.7 ml 27.35 ml/m  AORTIC VALVE AV Area (Vmax):    1.82 cm AV Area (Vmean):   1.90 cm AV Area (VTI):     2.16 cm AV Vmax:           185.00 cm/s AV Vmean:          128.000 cm/s AV VTI:            0.380 m AV Peak Grad:      13.7 mmHg AV Mean Grad:      7.0 mmHg LVOT Vmax:         148.00 cm/s LVOT Vmean:        107.000 cm/s LVOT  VTI:          0.361 m LVOT/AV VTI ratio: 0.95  AORTA Ao Root diam: 2.90 cm Ao Asc diam:  3.60 cm MITRAL VALVE MV Area (PHT): 3.66 cm     SHUNTS MV Decel Time: 207 msec     Systemic VTI:  0.36 m MV E velocity: 102.00 cm/s  Systemic Diam: 1.70 cm MV A velocity: 97.50 cm/s MV E/A ratio:  1.05 Sunit Tolia DO Electronically signed by Tessa Lerner DO Signature Date/Time: 04/12/2023/4:12:37 PM    Final    DG Chest 2 View  Result Date: 04/12/2023 CLINICAL DATA:  Shortness of breath EXAM: CHEST - 2 VIEW COMPARISON:  07/21/2021 FINDINGS: Enlarged cardiopericardial silhouette with some vascular congestion and trace edema. Tiny effusions. No pneumothorax or consolidation. IMPRESSION: Enlarged heart with vascular congestion and trace edema. Tiny effusions. Electronically Signed   By: Karen Kays M.D.   On: 04/12/2023 15:48    Pending Labs Unresulted Labs (From admission, onward)    None       Vitals/Pain Today's Vitals   04/12/23 1457 04/12/23 1515 04/12/23 1600 04/12/23 1630  BP:  138/73 (!) 144/76 128/61  Pulse:    81  Resp:    16  Temp:      SpO2:    95%  Weight: 111.6 kg     Height: 5\' 9"  (1.753 m)       Isolation Precautions No active isolations  Medications Medications  furosemide (LASIX) injection 80 mg (has no administration in time range)    Mobility walks     Focused Assessments Cardiac Assessment Handoff:    No results found for: "CKTOTAL", "CKMB", "CKMBINDEX", "TROPONINI" No results found for: "DDIMER" Does the Patient  currently have chest pain? Yes    R Recommendations: See Admitting Provider Note  Report given to:   Additional Notes:

## 2023-04-12 NOTE — H&P (Addendum)
History and Physical    Patient: [name]:[id number] DOB: [birth year] DOA: 04/12/2023 DOS: the patient was seen and examined on 04/12/2023 PCP: Jackie Plum, MD  Patient coming from: PCP office  Chief Complaint:  Chief Complaint  Patient presents with   Shortness of Breath   HPI: [name] is a 52 y.o. female with medical history significant of hypertension, hyperlipidemia, diastolic CHF, CKD stage IV, and OSA on CPAP who presents with complaints of shortness of breath.  She just recently had a right brachiocephalic AV fistula placed with Dr. Sherral Hammers on 8/27.  Since the procedure.  Reports having some soreness in the affected arm but denies having any redness or drainage from the surgical site.  Following the procedure however she did report having increasing shortness of breath.  Shortness of breath usually occurred with exertion.  Reported having associated symptoms of palpitations, lightheadedness, and headache.  Denied having any significant cough or fever.  Patient chronically reports having orthopnea, but had reverted to using her CPAP sometimes during the day to catch her breath.  Patient is supposed to be on Lasix 20 mg twice daily, but patient notes intermittent compliance with taking the medication.  The last couple of days she reports that she has been taking it regularly without any improvement in her symptoms.  She had gone to her primary care provider today for concerns.   In the emergency department patient was noted to be afebrile with O2 saturations maintained on room air, and all other vital signs maintained.  Labs significant for potassium 3.3, CO2 19, BUN 46, creatinine 4.58, and BNP 494.9.  Chest x-ray noted and large heart with vascular congestion and trace edema.  She was sent for a stat echocardiogram which noted EF to be 60 to 65% with small posterior pericardial effusion. Case had been discussed with nephrology but they had not been formally consulted and  recommended giving patient Lasix 80 mg IV.   Review of Systems: As mentioned in the history of present illness. All other systems reviewed and are negative. Past Medical History:  Diagnosis Date   Anemia    Anxiety    Arthritis    Asthma    Cataract    Mixed form OU   CKD (chronic kidney disease)    Coronary artery disease    Depression    Diabetes mellitus    Diabetic retinopathy (HCC)    NPDR OU   Dyspnea    Hyperlipidemia    Hypertension    Hypertensive retinopathy    OU   Left thyroid nodule    diagnosed 07/2018   PAC (premature atrial contraction) 02/15/2021   Pseudotumor cerebri    Sleep apnea    Uses a cpap   Stroke (HCC)    Vitamin D deficiency    Past Surgical History:  Procedure Laterality Date   ACHILLES TENDON REPAIR Right    ACHILLES TENDON SURGERY Left 02/19/2021   Procedure: ACHILLES TENDON REPAIR WITH GRAFT;  Surgeon: Candelaria Stagers, DPM;  Location: [facility];  Service: Podiatry;  Laterality: Left;  BLOCK   AMPUTATION TOE Right 05/28/2021   Procedure: AMPUTATION TOE;  Surgeon: Louann Sjogren, MD;  Location: [facility] OR;  Service: Podiatry;  Laterality: Right;  Surgical team will do block   AV FISTULA PLACEMENT Left 07/29/2022   Procedure: LEFT ARTERIOVENOUS (AV) FISTULA CREATION;  Surgeon: Maeola Harman, MD;  Location: [facility] OR;  Service: Vascular;  Laterality: Left;   AV FISTULA PLACEMENT Right  04/04/2023   Procedure: RIGHT ARM BRACHIOCEPHALIC ARTERIOVENOUS (AV) FISTULA CREATION;  Surgeon: Victorino Sparrow, MD;  Location: [facility] OR;  Service: Vascular;  Laterality: Right;  With regional block   GASTROC RECESSION EXTREMITY Left 02/19/2021   Procedure: GASTROC RECESSION EXTREMITY;  Surgeon: Candelaria Stagers, DPM;  Location: [facility];  Service: Podiatry;  Laterality: Left;  Block   TOE SURGERY Left 01/2023   TUBAL LIGATION     Social History:  reports that she quit smoking about 7 years ago. Her smoking use included  cigarettes. She started smoking about 37 years ago. She has a 15 pack-year smoking history. She has been exposed to tobacco smoke. She has never used smokeless tobacco. She reports that she does not currently use alcohol. She reports that she does not currently use drugs after having used the following drugs: Marijuana.  Allergies  Allergen Reactions   Bee Pollen Other (See Comments)    Seasonal Allergies   Dust Mite Extract Cough    Sneezing & Cough   Mixed Ragweed Itching    Family History  Problem Relation Age of Onset   Asthma Mother    Diabetes Father    Hypertension Father    Kidney disease Father    Heart attack Father    Heart failure Father    Hypertension Sister    Asthma Sister    Congestive Heart Failure Maternal Aunt    Diabetes Maternal Grandmother    Congestive Heart Failure Maternal Grandmother    Lung disease Maternal Grandmother    Asthma Other    Hyperlipidemia Other    Hypertension Other    Cancer Other     Prior to Admission medications   Medication Sig Start Date End Date Taking? Authorizing Provider  Accu-Chek FastClix Lancets MISC 1 each by Other route 4 (four) times daily. 07/04/22   Roma Kayser, MD  ACCU-CHEK GUIDE test strip 1 each by Other route 4 (four) times daily. for testing 07/04/22   Roma Kayser, MD  albuterol (VENTOLIN HFA) 108 (90 Base) MCG/ACT inhaler Inhale 1-2 puffs into the lungs every 6 (six) hours as needed for wheezing or shortness of breath (Asthma).    [provider]  amLODipine (NORVASC) 10 MG tablet Take 1 tablet (10 mg total) by mouth daily. 01/20/23 07/19/23  Patwardhan, Anabel Bene, MD  aspirin 81 MG EC tablet Take 1 tablet (81 mg total) by mouth daily. 10/14/20   Patwardhan, Anabel Bene, MD  blood glucose meter kit and supplies KIT Dispense based on patient and insurance preference. Use up to four times daily as directed. (FOR ICD-9 250.00, 250.01). 04/12/18   Glenford Bayley, NP  Blood Glucose Monitoring  Suppl (ACCU-CHEK GUIDE ME) w/Device KIT Use to check blood glucose four times daily 07/04/22   Roma Kayser, MD  calcitRIOL (ROCALTROL) 0.25 MCG capsule Take 0.25 mcg by mouth daily. 10/07/22   [provider]  Continuous Blood Gluc Receiver (DEXCOM G7 RECEIVER) DEVI Use to monitor BG continuously 04/13/22   Roma Kayser, MD  Continuous Glucose Sensor (DEXCOM G7 SENSOR) MISC Change sensor every 10 days 12/21/22   Roma Kayser, MD  EPINEPHrine 0.3 mg/0.3 mL IJ SOAJ injection Inject 0.3 mg into the muscle as needed for anaphylaxis. 12/08/21   Redwine, Madison A, PA-C  furosemide (LASIX) 20 MG tablet Take 20 mg by mouth 2 (two) times daily.    [provider]  insulin aspart protamine - aspart (NOVOLOG 70/30 MIX) (70-30)  100 UNIT/ML FlexPen Inject 30-40 Units into the skin 2 (two) times daily with a meal. Patient taking differently: Inject 30-40 Units into the skin See admin instructions. Take 40 units  in th morning and 30 units in the evening 12/21/22   Roma Kayser, MD  Insulin Pen Needle 32G X 4 MM MISC Use to inject insulin twice daily 01/23/23   Roma Kayser, MD  Insulin Syringes, Disposable, U-100 1 ML MISC 1 application by Does not apply route 2 (two) times a day. 02/20/19   Love, Evlyn Kanner, PA-C  isosorbide-hydrALAZINE (BIDIL) 20-37.5 MG tablet Take 1 tablet by mouth 3 (three) times daily. Patient taking differently: Take 2 tablets by mouth 3 (three) times daily. 04/23/22   Almon Hercules, MD  labetalol (NORMODYNE) 300 MG tablet Take 300 mg by mouth 2 (two) times daily. 08/16/21   [provider]  ondansetron (ZOFRAN) 4 MG tablet Take 1 tablet (4 mg total) by mouth every 8 (eight) hours as needed for nausea or vomiting. 01/17/23   Louann Sjogren, DPM  oxyCODONE-acetaminophen (PERCOCET/ROXICET) 5-325 MG tablet Take 1 tablet by mouth every 6 (six) hours as needed. 04/04/23   Lars Mage, PA-C  rosuvastatin (CRESTOR) 40 MG tablet Take 40  mg by mouth in the morning.    [provider]  Semaglutide,0.25 or 0.5MG /DOS, (OZEMPIC, 0.25 OR 0.5 MG/DOSE,) 2 MG/3ML SOPN Inject 0.25 mg into the skin once a week. 03/28/23   Roma Kayser, MD    Physical Exam: Vitals:   04/12/23 1457 04/12/23 1515 04/12/23 1600 04/12/23 1630  BP:  138/73 (!) 144/76 128/61  Pulse:    81  Resp:    16  Temp:      SpO2:    95%  Weight: 111.6 kg     Height: 5\' 9"  (1.753 m)      Constitutional: Middle-aged female currently in no acute distress sitting up in the hospital prior Eyes: PERRL, lids and conjunctivae normal ENMT: Mucous membranes are moist.  Patient missing lower front two teeth. Neck: normal, supple  Respiratory: Normal respiratory effort with some intermittent crackles appreciated in the mid to lower lung fields. Cardiovascular: Regular rate and rhythm, no murmurs / rubs / gallops.  Right forearm fistula in place. Abdomen: no tenderness, no masses palpated.  Bowel sounds positive.  Musculoskeletal: no clubbing / cyanosis.  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 Skin: no rashes, lesions, ulcers. No induration Neurologic: CN 2-12 grossly intact.  Strength 5/5 in all 4.  Psychiatric: Normal judgment and insight. Alert and oriented x 3. Normal mood.   Data Reviewed:  EKG reveals sinus rhythm at 75 bpm with QTc 502.  Reviewed labs, imaging, and pertinent records as documented in this note.  Assessment and Plan:  Dyspnea secondary to acute on chronic diastolic congestive heart failure Pericardial effusion Patient presented with complaints of worsening shortness of breath.  O2 saturations currently maintained on room air.  Chest x-ray noted an enlarged heart with vascular congestion and trace edema.  BNP elevated at 494.9.  Echocardiogram noted EF to be 60 to 65% with normal diastolic parameters pericardial effusion without signs of tamponade.  Patient had been given Lasix 80 mg  IV x  1 dose.  Suspect symptoms secondary to intermittent compliance with Lasix. -Admit to a telemetry bed -Strict I&O's and daily weights -Lasix reduced to 40 mg IV twice daily started.  Reassess and determine need of continued diuresis -Continue  beta-blocker  Chronic kidney disease stage IV On admission creatinine 4.58 with BUN 46.  Baseline creatinine is ranged from 4.5-5 over the last several months.  Patient just recently had a fistula placed by Dr. Sherral Hammers on 8/27 and still makes urine at this time. -Continue Calcitrol -Continue to monitor kidney function daily while diuresing -Consider need to formally consult nephrology if having difficulties diuresing patient with Lasix  Hypokalemia Acute.  Initial potassium 3.3. -Give 40 meq of potassium chloride x 1 dose -Continue to monitor  Prolonged QT interval Chronic.  QTc 502.   -Avoid QT prolonging medications.  Essential hypertension Blood pressures currently maintained 128/61-146/69.  Patient did make note of feeling lightheaded and dizzy at times with palpitations. -Check orthostatic vital signs -Continue current regimen of amlodipine, isosorbide-hydralazine, and labetalol -Consider adjusting home blood pressure medications if needed  Anemia of chronic kidney disease Hemoglobin 8.2 which appears around patient's baseline. -Continue to monitor  Diabetes mellitus type 2, with long-term use of insulin Hemoglobin A1c was 7.4 on 8/20.  Patient is on 40 units of 70/30 insulin in a.m. and 30 units q. evening, and Ozempic -Hypoglycemic protocols -Reduced NovoLog 70/30 insulin to 20 units twice daily -CBGs before every meal with sensitive SSI -NovoLog insulin 0- 5 units nightly -Adjust regimen as needed  History of CVA -Continue aspirin and statin  Hyperlipidemia -Continue Crestor  OSA -Continue CPAP at night  DVT prophylaxis: Heparin Advance Care Planning:   Code Status: Full Code    Consults: None  Family Communication:  [name] Sister updated over the phone  Severity of Illness: The appropriate patient status for this patient is INPATIENT. Inpatient status is judged to be reasonable and necessary in order to provide the required intensity of service to ensure the patient's safety. The patient's presenting symptoms, physical exam findings, and initial radiographic and laboratory data in the context of their chronic comorbidities is felt to place them at high risk for further clinical deterioration. Furthermore, it is not anticipated that the patient will be medically stable for discharge from the hospital within 2 midnights of admission.   * I certify that at the point of admission it is my clinical judgment that the patient will require inpatient hospital care spanning beyond 2 midnights from the point of admission due to high intensity of service, high risk for further deterioration and high frequency of surveillance required.*  Author: Clydie Braun, MD 04/12/2023 4:45 PM  For on call review [email or web address].

## 2023-04-12 NOTE — ED Provider Triage Note (Signed)
Emergency Medicine Provider Triage Evaluation Note  [name] , a 52 y.o. female  was evaluated in triage.  Pt complains of SOB.  Review of Systems  Positive:  Negative:   Physical Exam  BP (!) 146/69 (BP Location: Left Arm)   Pulse 81   Temp 99 F (37.2 C)   Resp 18   LMP 12/21/2021   SpO2 95%  Gen:   Awake, no distress   Resp:  Normal effort  MSK:   Moves extremities without difficulty  Other:    Medical Decision Making  Medically screening exam initiated at 2:32 PM.  Appropriate orders placed.  [name] was informed that the remainder of the evaluation will be completed by another provider, this initial triage assessment does not replace that evaluation, and the importance of remaining in the ED until their evaluation is complete.  Fistula placed in right arm on 04/04/2023. Has had troubles with SOB since then. Patient stating that she has to stay on CPAP machine constantly d/t SOB.   Dorthy Cooler, [place] 04/12/23 1434

## 2023-04-12 NOTE — ED Provider Notes (Addendum)
[name] EMERGENCY DEPARTMENT AT [facility] Provider Note   CSN: [id number] Arrival date & time: 04/12/23  1345     History  Chief Complaint  Patient presents with   Shortness of Breath    [name] is a 52 y.o. female.   Shortness of Breath 52 year old female with history of OSA on CPAP at night, hypertension, type 2 diabetes, acute CVA, heart failure with preserved ejection fraction, and recently diagnosed CKD post AV fistula placement, to the ED due to concerns of shortness of breath.  Patient said shortness of breath started few days after the AV fistula placement as she is never had the symptoms before.  That is hard for her not to catch her breath when she walks from her bedroom to the bathroom, says she has needed to use her CPAP more frequently than before.  She report associated headache, headedness, palpitations and mild chest heaviness during her SOB episodes.Patient is  taking furosemide 20 mg for her heart failure with preserved ejection fraction but takes it when needed. She reports no issues with urination at this time.  Of note, pt had a fistula placed on the left arm with the intention of starting dialysis, her kidney function got better and never started dialysis because it wasn't giving a good flow.Pt just had another fistula placed on the right arm on 04/04/2023.   Home Medications Prior to Admission medications   Medication Sig Start Date End Date Taking? Authorizing Provider  Accu-Chek FastClix Lancets MISC 1 each by Other route 4 (four) times daily. 07/04/22   Roma Kayser, MD  ACCU-CHEK GUIDE test strip 1 each by Other route 4 (four) times daily. for testing 07/04/22   Roma Kayser, MD  albuterol (VENTOLIN HFA) 108 (90 Base) MCG/ACT inhaler Inhale 1-2 puffs into the lungs every 6 (six) hours as needed for wheezing or shortness of breath (Asthma).    [provider]  amLODipine (NORVASC) 10 MG tablet Take 1 tablet (10  mg total) by mouth daily. 01/20/23 07/19/23  Patwardhan, Anabel Bene, MD  aspirin 81 MG EC tablet Take 1 tablet (81 mg total) by mouth daily. 10/14/20   Patwardhan, Anabel Bene, MD  blood glucose meter kit and supplies KIT Dispense based on patient and insurance preference. Use up to four times daily as directed. (FOR ICD-9 250.00, 250.01). 04/12/18   Glenford Bayley, NP  Blood Glucose Monitoring Suppl (ACCU-CHEK GUIDE ME) w/Device KIT Use to check blood glucose four times daily 07/04/22   Roma Kayser, MD  calcitRIOL (ROCALTROL) 0.25 MCG capsule Take 0.25 mcg by mouth daily. 10/07/22   [provider]  Continuous Blood Gluc Receiver (DEXCOM G7 RECEIVER) DEVI Use to monitor BG continuously 04/13/22   Roma Kayser, MD  Continuous Glucose Sensor (DEXCOM G7 SENSOR) MISC Change sensor every 10 days 12/21/22   Roma Kayser, MD  EPINEPHrine 0.3 mg/0.3 mL IJ SOAJ injection Inject 0.3 mg into the muscle as needed for anaphylaxis. 12/08/21   Redwine, Madison A, PA-C  furosemide (LASIX) 20 MG tablet Take 20 mg by mouth 2 (two) times daily.    [provider]  insulin aspart protamine - aspart (NOVOLOG 70/30 MIX) (70-30) 100 UNIT/ML FlexPen Inject 30-40 Units into the skin 2 (two) times daily with a meal. Patient taking differently: Inject 30-40 Units into the skin See admin instructions. Take 40 units  in th morning and 30 units in the evening 12/21/22   Roma Kayser, MD  Insulin Pen Needle 32G X 4 MM MISC Use to inject insulin twice daily 01/23/23   Roma Kayser, MD  Insulin Syringes, Disposable, U-100 1 ML MISC 1 application by Does not apply route 2 (two) times a day. 02/20/19   Love, Evlyn Kanner, PA-C  isosorbide-hydrALAZINE (BIDIL) 20-37.5 MG tablet Take 1 tablet by mouth 3 (three) times daily. Patient taking differently: Take 2 tablets by mouth 3 (three) times daily. 04/23/22   Almon Hercules, MD  labetalol (NORMODYNE) 300 MG tablet Take 300 mg by mouth 2 (two)  times daily. 08/16/21   [provider]  ondansetron (ZOFRAN) 4 MG tablet Take 1 tablet (4 mg total) by mouth every 8 (eight) hours as needed for nausea or vomiting. 01/17/23   Louann Sjogren, DPM  oxyCODONE-acetaminophen (PERCOCET/ROXICET) 5-325 MG tablet Take 1 tablet by mouth every 6 (six) hours as needed. 04/04/23   Lars Mage, PA-C  rosuvastatin (CRESTOR) 40 MG tablet Take 40 mg by mouth in the morning.    [provider]  Semaglutide,0.25 or 0.5MG /DOS, (OZEMPIC, 0.25 OR 0.5 MG/DOSE,) 2 MG/3ML SOPN Inject 0.25 mg into the skin once a week. 03/28/23   Roma Kayser, MD      Allergies    Bee pollen, Dust mite extract, and Mixed ragweed    Review of Systems   Review of Systems  Respiratory:  Positive for shortness of breath.     Physical Exam Updated Vital Signs BP 138/73   Pulse 81   Temp 99 F (37.2 C)   Resp 18   Ht 5\' 9"  (1.753 m)   Wt 111.6 kg   LMP 12/21/2021   SpO2 95%   BMI 36.33 kg/m  Physical Exam Constitutional:      Appearance: She is obese.  HENT:     Head: Normocephalic and atraumatic.  Cardiovascular:     Rate and Rhythm: Normal rate and regular rhythm.     Comments: Crackles heard on ascultation  Pulmonary:     Effort: Pulmonary effort is normal.     Breath sounds: Normal breath sounds.  Abdominal:     Palpations: Abdomen is soft.     Tenderness: There is no abdominal tenderness. There is no guarding or rebound.  Musculoskeletal:     Comments: No signs of LE edema  Neurological:     Mental Status: She is alert.     ED Results / Procedures / Treatments   Labs (all labs ordered are listed, but only abnormal results are displayed) Labs Reviewed  RESP PANEL BY RT-PCR (RSV, FLU A&B, COVID)  [id number]  COMPREHENSIVE METABOLIC PANEL  CBC  BRAIN NATRIURETIC PEPTIDE    EKG None  Radiology No results found.  Procedures Procedures    Medications Ordered in ED Medications - No data to display  ED Course/ Medical  Decision Making/ A&P                                 Medical Decision Making Risk Prescription drug management. Decision regarding hospitalization.   This patient is a 52 y.o. female who presents to the ED for concern of shortness of breath, this involves an extensive number of treatment options, and is a complaint that carries with it a high risk of complications and morbidity. The emergent differential diagnosis prior to evaluation includes, but is not limited to,  HF exacerbation ,COPD exacerbation ,Asthma, . This is not an exhaustive differential.  Physical Exam: Physical exam performed. The pertinent findings include: Crackles heard on auscultation bilaterally    Lab Tests: I ordered, and personally interpreted labs.  The pertinent results include:  Hypokalemia of 3.3 and increased Cr of 4.58   Imaging Studies: I ordered imaging studies including CXR . I independently visualized and interpreted imaging which showed Enlarged heart with vascular congestion and trace edema. Tiny effusions.I agree with the radiologist interpretation.   Medications: I ordered medication including iv Lasix 80 MG bid  for volume overload .   Consultations Obtained: I requested consultation with the Nephrology ,  and discussed lab and imaging findings as well as pertinent plan - they recommend giving IV Laxis 80 mg bid and admit patient for follow up. Plan is for nephrology to see patient if patients SOB doesn't improve.   Disposition: After consideration of the diagnostic results , I feel that the patient is fluid overload in the setting of worsening kidney function vs acute exacerbation of HF. I agree with Nephrology's plan above. Will consult hospitalist for admission.   I discussed this case with my attending physician Dr. Silverio Lay who cosigned this note including patient's presenting symptoms, physical exam, and planned diagnostics and interventions. Attending physician stated agreement with plan or made  changes to plan which were implemented.           Final Clinical Impression(s) / ED Diagnoses Final diagnoses:  None    Rx / DC Orders ED Discharge Orders     None         Kathleen Lime, MD 04/12/23 Aldona Lento    Kathleen Lime, MD 04/12/23 1819    Charlynne Pander, MD 04/17/23 [phone]

## 2023-04-12 NOTE — Plan of Care (Signed)
  Problem: Coping: Goal: Ability to adjust to condition or change in health will improve Outcome: Progressing   Problem: Clinical Measurements: Goal: Respiratory complications will improve Outcome: Progressing   Problem: Activity: Goal: Risk for activity intolerance will decrease Outcome: Progressing   Problem: Safety: Goal: Ability to remain free from injury will improve Outcome: Progressing   

## 2023-04-12 NOTE — ED Triage Notes (Signed)
Patient recently had a fistula placed on 8/27 and has had increase SOB.  Reports having to stay on CPAP per PA note.

## 2023-04-13 DIAGNOSIS — I10 Essential (primary) hypertension: Secondary | ICD-10-CM | POA: Diagnosis not present

## 2023-04-13 DIAGNOSIS — I5033 Acute on chronic diastolic (congestive) heart failure: Secondary | ICD-10-CM | POA: Diagnosis not present

## 2023-04-13 DIAGNOSIS — E785 Hyperlipidemia, unspecified: Secondary | ICD-10-CM

## 2023-04-13 DIAGNOSIS — N184 Chronic kidney disease, stage 4 (severe): Secondary | ICD-10-CM | POA: Diagnosis not present

## 2023-04-13 DIAGNOSIS — E669 Obesity, unspecified: Secondary | ICD-10-CM

## 2023-04-13 DIAGNOSIS — E1169 Type 2 diabetes mellitus with other specified complication: Secondary | ICD-10-CM

## 2023-04-13 DIAGNOSIS — E66812 Obesity, class 2: Secondary | ICD-10-CM

## 2023-04-13 LAB — CBC
HCT: 23.1 % — ABNORMAL LOW (ref 36.0–46.0)
Hemoglobin: 7.5 g/dL — ABNORMAL LOW (ref 12.0–15.0)
MCH: 29.4 pg (ref 26.0–34.0)
MCHC: 32.5 g/dL (ref 30.0–36.0)
MCV: 90.6 fL (ref 80.0–100.0)
Platelets: 219 10*3/uL (ref 150–400)
RBC: 2.55 MIL/uL — ABNORMAL LOW (ref 3.87–5.11)
RDW: 15.2 % (ref 11.5–15.5)
WBC: 7 10*3/uL (ref 4.0–10.5)
nRBC: 0 % (ref 0.0–0.2)

## 2023-04-13 LAB — RENAL FUNCTION PANEL
Albumin: 3 g/dL — ABNORMAL LOW (ref 3.5–5.0)
Anion gap: 17 — ABNORMAL HIGH (ref 5–15)
BUN: 49 mg/dL — ABNORMAL HIGH (ref 6–20)
CO2: 20 mmol/L — ABNORMAL LOW (ref 22–32)
Calcium: 7.8 mg/dL — ABNORMAL LOW (ref 8.9–10.3)
Chloride: 102 mmol/L (ref 98–111)
Creatinine, Ser: 4.75 mg/dL — ABNORMAL HIGH (ref 0.44–1.00)
GFR, Estimated: 11 mL/min — ABNORMAL LOW (ref >60)
Glucose, Bld: 171 mg/dL — ABNORMAL HIGH (ref 70–99)
Phosphorus: 5.6 mg/dL — ABNORMAL HIGH (ref 2.5–4.6)
Potassium: 3.1 mmol/L — ABNORMAL LOW (ref 3.5–5.1)
Sodium: 139 mmol/L (ref 135–145)

## 2023-04-13 LAB — GLUCOSE, CAPILLARY
Glucose-Capillary: 128 mg/dL — ABNORMAL HIGH (ref 70–99)
Glucose-Capillary: 113 mg/dL — ABNORMAL HIGH (ref 70–99)
Glucose-Capillary: 161 mg/dL — ABNORMAL HIGH (ref 70–99)
Glucose-Capillary: 93 mg/dL (ref 70–99)

## 2023-04-13 MED ORDER — CALCIUM ACETATE (PHOS BINDER) 667 MG PO CAPS
667.0000 mg | ORAL_CAPSULE | Freq: Three times a day (TID) | ORAL | Status: DC
  Administered 2023-04-13 – 2023-04-22 (×23): 667 mg via ORAL
  Filled 2023-04-13 (×24): qty 1

## 2023-04-13 MED ORDER — POTASSIUM CHLORIDE CRYS ER 20 MEQ PO TBCR
40.0000 meq | EXTENDED_RELEASE_TABLET | Freq: Once | ORAL | Status: AC
  Administered 2023-04-13: 40 meq via ORAL
  Filled 2023-04-13: qty 2

## 2023-04-13 MED ORDER — FUROSEMIDE 10 MG/ML IJ SOLN
80.0000 mg | Freq: Two times a day (BID) | INTRAMUSCULAR | Status: DC
  Administered 2023-04-13: 80 mg via INTRAVENOUS
  Filled 2023-04-13: qty 8

## 2023-04-13 NOTE — Assessment & Plan Note (Signed)
Calculated BMI 36.2

## 2023-04-13 NOTE — Assessment & Plan Note (Addendum)
Echocardiogram with preserved LV systolic function with EF 60 to 65%, severe LVH, RV with preserved systolic function. Small pericardial effusion, posterior to the left ventricle. No significant valvular disease.   Urine output is 950  ml, since admission fluid balance negative -,1710 ml. Systolic blood pressure 111 to 124 mmHg.   Afterload reduction with hydralazine and isosorbide. Limited pharmacologic therapy due to reduced GFR.  Possible transition to oral diuretic therapy, possible torsemide 60 mg daily.

## 2023-04-13 NOTE — Progress Notes (Signed)
Mobility Specialist Progress Note:    04/13/23 1444  Mobility  Activity Ambulated with assistance in hallway  Level of Assistance Contact guard assist, steadying assist  Assistive Device Cane  Distance Ambulated (ft) 200 ft  Activity Response Tolerated well  Mobility Referral Yes  $Mobility charge 1 Mobility  Mobility Specialist Start Time (ACUTE ONLY) 1345  Mobility Specialist Stop Time (ACUTE ONLY) 1400  Mobility Specialist Time Calculation (min) (ACUTE ONLY) 15 min   Received pt in bed having no complaints and agreeable to mobility. C/o mild SOB, otherwise no c/o. Returned to room w/o fault. Left in bed w/ call bell in reach and all needs met.  Thompson Grayer Mobility Specialist  Please contact vis Secure Chat or  Rehab Office [phone]

## 2023-04-13 NOTE — Assessment & Plan Note (Signed)
Cpap

## 2023-04-13 NOTE — Assessment & Plan Note (Signed)
Continue blood pressure control with labetalol, hydralazine, isosorbide and amlodipine.

## 2023-04-13 NOTE — Care Management (Signed)
Transition of Care [facility]) - Inpatient Brief Assessment   Patient Details  Name: [name] MRN: [id number] Date of Birth: 1971-06-13  Transition of Care [facility] CM/SW Contact:    Lockie Pares, RN Phone Number: 04/13/2023, 2:58 PM   Clinical Narrative:  Patient presented with [facility]. Has not been compliant with lasix. Has cane for DME. No needs id at this time. If a need is identified please place a TOC consult  Transition of Care Asessment: Insurance and Status: Insurance coverage has been reviewed Patient has primary care physician: Yes Home environment has been reviewed: Y Prior level of function:: independent with cane Prior/Current Home Services: No current home services Social Determinants of Health Reivew: SDOH reviewed no interventions necessary Readmission risk has been reviewed: Yes Transition of care needs: no transition of care needs at this time

## 2023-04-13 NOTE — Hospital Course (Addendum)
[name] was admitted to the hospital with the working diagnosis of heart failure exacerbation.   52 yo female with the past medical history of hypertension, dyslipidemia, heart failure, CKD stage IV, and obesity class 2. Recent AV fistula creation for HD on 04/04/23.  Endorsed having progressive worsening dyspnea for the last 7 days, associated with palpitations, lightheadedness, and headaches. She has been not compliant taking furosemide at home. On her initial physical examination her blood pressure was 138/73, HR 81, RR 16 and 02 saturation 95%, lungs with rales but not wheezing, heart with S1 and S2 present and regular with no gallops, abdomen with no distention, no lower extremity edema. 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   Na 140, K 3,3 Cl 106 bicarbonate 19, glucose 102 bun 46 cr 4,58  BNP 494  Wbc 7.2 hgb 8,2 plt 215  Sars covid 19 negative    Chest radiograph with cardiomegaly, bilateral hilar vascular congestion with bilateral symmetric central interstitial infiltrates, small bilateral pleural effusions.   EKG 75 bpm, normal axis, qtc 502, sinus rhythm with sinus arrhythmia, no significant ST segment or T wave changes.   Patient has been placed on IV furosemide for diuresis.   09/07 patient is feeling better after IV furosemide, but serum cr has been rising. Consulted nephrology.  09/08 resumed IV furosemide. 09/09 increased diuresis, serum cr still not at baseline.  09/10 volume status more stable but continue to rise serum cr. Plan to transition to oral loop and thiazide diuretic therapy, if not good toleration, may need to start renal replacement therapy on this admission.

## 2023-04-13 NOTE — Assessment & Plan Note (Addendum)
Continue with low dose of basal insulin 70/30: 10 units bid. Continue insulin sliding scale for glucose cover and monitoring.  Fasting glucose today 142 mg/dl.   Continue statin therapy.

## 2023-04-13 NOTE — Evaluation (Signed)
Physical Therapy Evaluation Patient Details Name: [name] MRN: [id number] DOB: 12-25-1970 Today's Date: 04/13/2023  History of Present Illness  52 yo female presents to [facility] on 9/4 with SOB due to acute HF exacerbation. 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  Clinical Impression   Pt presents with baseline strength, balance, mobility, but does have slightly decreased activity tolerance given HF exacerbation. Pt ambulated good hallway distance with use of SPC, overall requiring increased time and x1 standing rest break but no physical assist. PT anticipates dyspnea on exertion and activity tolerance to improve with improvement in HF. PT explained energy conservation techniques (grocery pick up if needed, rest breaks) and when to be concerned about HF (weight gain, SOB, swelling), pt expresses understanding. Pt with no further PT needs at this time.          If plan is discharge home, recommend the following:     Can travel by private vehicle        Equipment Recommendations None recommended by PT  Recommendations for Other Services       Functional Status Assessment Patient has not had a recent decline in their functional status     Precautions / Restrictions Precautions Precautions: Fall Restrictions Weight Bearing Restrictions: No      Mobility  Bed Mobility Overal bed mobility: Modified Independent Bed Mobility: Supine to Sit     Supine to sit: Modified independent (Device/Increase time)     General bed mobility comments: HOB elevated and use of rails    Transfers Overall transfer level: Modified independent Equipment used: Straight cane Transfers: Sit to/from Stand Sit to Stand: Modified independent (Device/Increase time)           General transfer comment: use of cane    Ambulation/Gait Ambulation/Gait assistance: Modified independent  (Device/Increase time) Gait Distance (Feet): 290 Feet Assistive device: Straight cane Gait Pattern/deviations: Step-through pattern, Decreased stride length Gait velocity: decr     General Gait Details: x1 standing rest break for DOE 2/4, SpO2 92% and greater on RA  Stairs            Wheelchair Mobility     Tilt Bed    Modified Rankin (Stroke Patients Only)       Balance Overall balance assessment: Mild deficits observed, not formally tested                                           Pertinent Vitals/Pain Pain Assessment Pain Assessment: Faces Faces Pain Scale: No hurt Pain Intervention(s): Monitored during session    Home Living Family/patient expects to be discharged to:: Private residence Living Arrangements: Children Available Help at Discharge: Family Type of Home: Apartment Home Access: Stairs to enter   [employer] of Steps: a couple of steps between car and apt, but apt is on first floor   Home Layout: One level Home Equipment: Grab bars - tub/shower;Tub bench;Cane - single point      Prior Function Prior Level of Function : Independent/Modified Independent                     Extremity/Trunk Assessment   Upper Extremity Assessment Upper Extremity Assessment: Defer to OT evaluation    Lower Extremity Assessment Lower Extremity Assessment: Overall WFL for tasks assessed  Cervical / Trunk Assessment Cervical / Trunk Assessment: Normal  Communication   Communication Communication: No apparent difficulties  Cognition Arousal: Alert Behavior During Therapy: WFL for tasks assessed/performed Overall Cognitive Status: Within Functional Limits for tasks assessed                                          General Comments      Exercises     Assessment/Plan    PT Assessment Patient does not need any further PT services  PT Problem List         PT Treatment Interventions      PT  Goals (Current goals can be found in the Care Plan section)  Acute Rehab PT Goals Patient Stated Goal: home PT Goal Formulation: All assessment and education complete, DC therapy Time For Goal Achievement: 04/13/23 Potential to Achieve Goals: Good    Frequency       Co-evaluation               AM-PAC PT "6 Clicks" Mobility  Outcome Measure Help needed turning from your back to your side while in a flat bed without using bedrails?: None Help needed moving from lying on your back to sitting on the side of a flat bed without using bedrails?: None Help needed moving to and from a bed to a chair (including a wheelchair)?: None Help needed standing up from a chair using your arms (e.g., wheelchair or bedside chair)?: None Help needed to walk in hospital room?: None Help needed climbing 3-5 steps with a railing? : A Little 6 Click Score: 23    End of Session   Activity Tolerance: Patient tolerated treatment well Patient left: in bed;with call bell/phone within reach Nurse Communication: Mobility status PT Visit Diagnosis: Other abnormalities of gait and mobility (R26.89)    Time: 1610-9604 PT Time Calculation (min) (ACUTE ONLY): 20 min   Charges:   PT Evaluation $PT Eval Low Complexity: 1 Low   PT General Charges $$ ACUTE PT VISIT: 1 Visit         Marye Round, PT DPT Acute Rehabilitation Services Secure Chat Preferred  Office [phone]   Taronda Comacho E Christain [place] 04/13/2023, 10:24 AM

## 2023-04-13 NOTE — Assessment & Plan Note (Addendum)
AKI, Hypokalemia. Hypomagnesemia   She is feeling better.  Renal function today with serum cr at 5,21, K 3.8, bicarb is 20.  Na 140 Mg 1.5   Plan to hold on furosemide for now.   Metabolic bone disease, continue with phosphate binders.  Continue with calcitriol.   Anemia of chronic disease, stable Hgb.

## 2023-04-13 NOTE — Progress Notes (Signed)
Progress Note   Patient: [name] JYN:[id number] DOB: Apr 12, 1971 DOA: 04/12/2023     1 DOS: the patient was seen and examined on 04/13/2023   Brief hospital course: Mrs. [name] was admitted to the hospital with the working diagnosis of heart failure exacerbation.   52 yo female with the past medical history of hypertension, dyslipidemia, heart failure, CKD stage IV, and obesity class 2. Recent AV fistula creation for HD on 04/04/23.  Endorsed having progressive worsening dyspnea for the last 7 days, associated with palpitation, lightheadedness, and headaches. She has been not compliant taking furosemide at home. On her initial physical examination her blood pressure was 138/73, HR 81, RR 16 and 02 saturation 95%, lungs with rales but not wheezing, heart with S1 and S2 present and regular with no gallops, abdomen with no distention, no lower extremity edema. 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   Na 140, K 3,3 Cl 106 bicarbonate 19, glucose 102 bun 46 cr 4,58  BNP 494  Wbc 7.2 hgb 8,2 plt 215  Sars covid 19 negative    Chest radiograph with cardiomegaly, bilateral hilar vascular congestion with bilateral symmetric central interstitial infiltrates, small bilateral pleural effusions.   EKG 75 bpm, normal axis, qtc 502, sinus rhythm with sinus arrhythmia, no significant ST segment or T wave changes.       Assessment and Plan: * Acute on chronic diastolic CHF (congestive heart failure) (HCC) Echocardiogram with preserved LV systolic function with EF 60 to 65%, severe LVH, RV with preserved systolic function. Small pericardial effusion, posterior to the left ventricle. No significant valvular disease.   Urine output is 1000 ml Systolic blood pressure 120 to 130 mmHg.   Plan to increase furosemide to 80 mg IV q12  Afterload reduction with hydralazine and isosorbide. Limited pharmacologic therapy due to reduced GFR.   CKD (chronic kidney  disease), stage IV (HCC) Hypokalemia.   Volume has improved, but continue to have dyspnea on exertion.  Renal function today with serum cr at 4.75 with K at 3,1 and serum bicarbonate at 20, Na 139 and P 5,6   Plan to increase furosemide to 80 mg IV q12 hrs Follow up renal function in am.   Metabolic bone disease, add phosphate binders.  Continue with calcitriol.   Anemia of chronic disease, stable Hgb.   Essential hypertension, benign Continue blood pressure control with labetalol, hydralazine, isosorbide and amlodipine.   Type 2 diabetes mellitus with hyperlipidemia (HCC) Continue with insulin therapy, sliding scale and basal at a reduced dose to avoid hypoglycemia.  Patient is tolerating po well.   Continue statin therapy.   H/O: stroke Continue blood pressure control.  Continue with aspirin and statin.   Obstructive sleep apnea Cpap  Class 2 obesity Calculated BMI 36.2         Subjective: Patient with improvement in her symptoms but continue to have dyspnea on exertion, not her baseline   Physical Exam: Vitals:   04/13/23 0107 04/13/23 0411 04/13/23 0813 04/13/23 1202  BP: 118/61 130/66 (!) 150/77 127/68  Pulse: 76 74 76 77  Resp: 18 18 18 18   Temp: 98 F (36.7 C) 97.9 F (36.6 C) 97.8 F (36.6 C) 97.8 F (36.6 C)  TempSrc: Oral Oral Oral Oral  SpO2: 97% 97% 96% 98%  Weight:  111.4 kg    Height:       Neurology awake and alert ENT with mild pallor with no icterus Cardiovascular with S1  and S2 present and regular with no gallops. Respiratory with mild rales at bases with no wheezing or rhonchi Abdomen with no distention  Lower extremity edema +  Data Reviewed:    Family Communication: I spoke with patient's daughter at the bedside, we talked in detail about patient's condition, plan of care and prognosis and all questions were addressed.   Disposition: Status is: Inpatient Remains inpatient appropriate because: IV diuresis   Planned Discharge  Destination: Home     Author: Coralie Keens, MD 04/13/2023 2:52 PM  For on call review [email or web address].

## 2023-04-13 NOTE — Progress Notes (Signed)
Heart Failure Navigator Progress Note  Assessed for Heart & Vascular TOC clinic readiness.  Patient does not meet criteria due to established with [facility] cardiology.   Navigator will sign off.   Sharen Hones, PharmD, BCPS Heart Failure Stewardship Pharmacist Phone [phone]

## 2023-04-13 NOTE — Assessment & Plan Note (Signed)
Continue blood pressure control.  Continue with aspirin and statin

## 2023-04-14 DIAGNOSIS — I5033 Acute on chronic diastolic (congestive) heart failure: Secondary | ICD-10-CM | POA: Diagnosis not present

## 2023-04-14 DIAGNOSIS — I10 Essential (primary) hypertension: Secondary | ICD-10-CM | POA: Diagnosis not present

## 2023-04-14 DIAGNOSIS — N184 Chronic kidney disease, stage 4 (severe): Secondary | ICD-10-CM | POA: Diagnosis not present

## 2023-04-14 DIAGNOSIS — E1169 Type 2 diabetes mellitus with other specified complication: Secondary | ICD-10-CM | POA: Diagnosis not present

## 2023-04-14 LAB — BASIC METABOLIC PANEL
Anion gap: 16 — ABNORMAL HIGH (ref 5–15)
BUN: 46 mg/dL — ABNORMAL HIGH (ref 6–20)
CO2: 20 mmol/L — ABNORMAL LOW (ref 22–32)
Calcium: 7.8 mg/dL — ABNORMAL LOW (ref 8.9–10.3)
Chloride: 104 mmol/L (ref 98–111)
Creatinine, Ser: 5.21 mg/dL — ABNORMAL HIGH (ref 0.44–1.00)
GFR, Estimated: 9 mL/min — ABNORMAL LOW (ref >60)
Glucose, Bld: 81 mg/dL (ref 70–99)
Potassium: 3.8 mmol/L (ref 3.5–5.1)
Sodium: 140 mmol/L (ref 135–145)

## 2023-04-14 LAB — MAGNESIUM: Magnesium: 1.5 mg/dL — ABNORMAL LOW (ref 1.7–2.4)

## 2023-04-14 LAB — GLUCOSE, CAPILLARY
Glucose-Capillary: 105 mg/dL — ABNORMAL HIGH (ref 70–99)
Glucose-Capillary: 149 mg/dL — ABNORMAL HIGH (ref 70–99)
Glucose-Capillary: 129 mg/dL — ABNORMAL HIGH (ref 70–99)
Glucose-Capillary: 180 mg/dL — ABNORMAL HIGH (ref 70–99)

## 2023-04-14 MED ORDER — MAGNESIUM SULFATE 4 GM/100ML IV SOLN
4.0000 g | Freq: Once | INTRAVENOUS | Status: AC
  Administered 2023-04-14: 4 g via INTRAVENOUS
  Filled 2023-04-14: qty 100

## 2023-04-14 MED ORDER — INSULIN ASPART PROT & ASPART (70-30 MIX) 100 UNIT/ML ~~LOC~~ SUSP
10.0000 [IU] | Freq: Two times a day (BID) | SUBCUTANEOUS | Status: DC
  Administered 2023-04-14 – 2023-04-22 (×15): 10 [IU] via SUBCUTANEOUS
  Filled 2023-04-14: qty 10

## 2023-04-14 NOTE — Progress Notes (Signed)
Progress Note   Patient: [name] ZOX:[id number] DOB: 20-Mar-1971 DOA: 04/12/2023     2 DOS: the patient was seen and examined on 04/14/2023   Brief hospital course: [name] was admitted to the hospital with the working diagnosis of heart failure exacerbation.   52 yo female with the past medical history of hypertension, dyslipidemia, heart failure, CKD stage IV, and obesity class 2. Recent AV fistula creation for HD on 04/04/23.  Endorsed having progressive worsening dyspnea for the last 7 days, associated with palpitation, lightheadedness, and headaches. She has been not compliant taking furosemide at home. On her initial physical examination her blood pressure was 138/73, HR 81, RR 16 and 02 saturation 95%, lungs with rales but not wheezing, heart with S1 and S2 present and regular with no gallops, abdomen with no distention, no lower extremity edema. 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   Na 140, K 3,3 Cl 106 bicarbonate 19, glucose 102 bun 46 cr 4,58  BNP 494  Wbc 7.2 hgb 8,2 plt 215  Sars covid 19 negative    Chest radiograph with cardiomegaly, bilateral hilar vascular congestion with bilateral symmetric central interstitial infiltrates, small bilateral pleural effusions.   EKG 75 bpm, normal axis, qtc 502, sinus rhythm with sinus arrhythmia, no significant ST segment or T wave changes.   Patient has been placed on IV furosemide for diuresis.   Assessment and Plan: * Acute on chronic diastolic CHF (congestive heart failure) (HCC) Echocardiogram with preserved LV systolic function with EF 60 to 65%, severe LVH, RV with preserved systolic function. Small pericardial effusion, posterior to the left ventricle. No significant valvular disease.   Urine output is 600  ml. Weight has not much changed.  Systolic blood pressure 120 to 112 mmHg.   Renal function with rise in serum cr, will hold on furosemide for now.    Afterload reduction  with hydralazine and isosorbide. Limited pharmacologic therapy due to reduced GFR.   CKD (chronic kidney disease), stage IV (HCC) AKI, Hypokalemia. Hypomagnesemia   She is feeling better.  Renal function today with serum cr at 5,21, K 3.8, bicarb is 20.  Na 140 Mg 1.5   Plan to hold on furosemide for now.   Metabolic bone disease, continue with phosphate binders.  Continue with calcitriol.   Anemia of chronic disease, stable Hgb.   Essential hypertension, benign Continue blood pressure control with labetalol, hydralazine, isosorbide and amlodipine.   Type 2 diabetes mellitus with hyperlipidemia (HCC) Capillary glucose has been trending down, 93. 105 and 149.  Will decrease basal insulin 70/30 to 10 units bid. Continue insulin sliding scale for glucose cover and monitoring.   Continue statin therapy.   H/O: stroke Continue blood pressure control.  Continue with aspirin and statin.   Obstructive sleep apnea Cpap  Class 2 obesity Calculated BMI 36.2         Subjective: Patient is feeling better today, decreased dyspnea when ambulating. She has been dizzy and lightheaded.   Physical Exam: Vitals:   04/14/23 0001 04/14/23 0327 04/14/23 0755 04/14/23 1126  BP: (!) 112/58 130/68 (!) 145/74 120/66  Pulse: 77 82 83   Resp: 18 18 18 18   Temp: 98.5 F (36.9 C) 99.1 F (37.3 C) 99.2 F (37.3 C) 98.1 F (36.7 C)  TempSrc: Axillary Oral Oral Oral  SpO2: 97% 98% 100%   Weight:  112.6 kg    Height:       Neurology awake  and alert ENT with mild pallor Cardiovascular with S1 and S2 present and regular with no gallops or rubs Respiratory with no wheezing or rales Abdomen with no distention  No lower extremity edema  Data Reviewed:   Family Communication: no family at the bedside   Disposition: Status is: Inpatient Remains inpatient appropriate because: renal failure   Planned Discharge Destination: Home     Author: Coralie Keens, MD 04/14/2023  11:40 AM  For on call review [email or web address].

## 2023-04-14 NOTE — Plan of Care (Signed)

## 2023-04-14 NOTE — Progress Notes (Signed)
   04/13/23 2215  BiPAP/CPAP/SIPAP  $ Non-Invasive Home Ventilator  Subsequent  BiPAP/CPAP/SIPAP Pt Type Adult  BiPAP/CPAP/SIPAP Resmed  Mask Type Full face mask  Mask Size Medium  Respiratory Rate 18 breaths/min  EPAP (S)  8 cmH2O  FiO2 (%) 21 %  Patient Home Equipment No  CPAP/SIPAP surface wiped down Yes  BiPAP/CPAP /SiPAP Vitals  Pulse Rate 80  SpO2 98 %  MEWS Score/Color  MEWS Score 0  MEWS Score Color [place]

## 2023-04-14 NOTE — Progress Notes (Signed)
Mobility Specialist Progress Note:    04/14/23 1049  Mobility  Activity Ambulated with assistance in hallway  Level of Assistance Contact guard assist, steadying assist  Assistive Device Cane  Distance Ambulated (ft) 350 ft  Activity Response Tolerated well  Mobility Referral Yes  $Mobility charge 1 Mobility  Mobility Specialist Start Time (ACUTE ONLY) 0950  Mobility Specialist Stop Time (ACUTE ONLY) 1005  Mobility Specialist Time Calculation (min) (ACUTE ONLY) 15 min   Pt received in bed, agreeable to ambulate. During ambulation pt c/o dizziness needing x2 standing breaks. Offered a seated back but pt declined. Was able to make it back to room, where mobility specialist checked BP it was 135/65. RN notified. Situated in bed w/ call bell and personal belongings in reach. All needs met.  Thompson Grayer Mobility Specialist  Please contact vis Secure Chat or  Rehab Office [phone]

## 2023-04-15 ENCOUNTER — Inpatient Hospital Stay (HOSPITAL_COMMUNITY): Payer: Medicaid Other

## 2023-04-15 DIAGNOSIS — E1169 Type 2 diabetes mellitus with other specified complication: Secondary | ICD-10-CM | POA: Diagnosis not present

## 2023-04-15 DIAGNOSIS — I5033 Acute on chronic diastolic (congestive) heart failure: Secondary | ICD-10-CM | POA: Diagnosis not present

## 2023-04-15 DIAGNOSIS — N184 Chronic kidney disease, stage 4 (severe): Secondary | ICD-10-CM | POA: Diagnosis not present

## 2023-04-15 DIAGNOSIS — I10 Essential (primary) hypertension: Secondary | ICD-10-CM | POA: Diagnosis not present

## 2023-04-15 LAB — GLUCOSE, CAPILLARY
Glucose-Capillary: 137 mg/dL — ABNORMAL HIGH (ref 70–99)
Glucose-Capillary: 139 mg/dL — ABNORMAL HIGH (ref 70–99)
Glucose-Capillary: 155 mg/dL — ABNORMAL HIGH (ref 70–99)
Glucose-Capillary: 182 mg/dL — ABNORMAL HIGH (ref 70–99)

## 2023-04-15 LAB — RENAL FUNCTION PANEL
Albumin: 3 g/dL — ABNORMAL LOW (ref 3.5–5.0)
Anion gap: 12 (ref 5–15)
BUN: 45 mg/dL — ABNORMAL HIGH (ref 6–20)
CO2: 19 mmol/L — ABNORMAL LOW (ref 22–32)
Calcium: 7.6 mg/dL — ABNORMAL LOW (ref 8.9–10.3)
Chloride: 101 mmol/L (ref 98–111)
Creatinine, Ser: 5.58 mg/dL — ABNORMAL HIGH (ref 0.44–1.00)
GFR, Estimated: 9 mL/min — ABNORMAL LOW (ref >60)
Glucose, Bld: 135 mg/dL — ABNORMAL HIGH (ref 70–99)
Phosphorus: 5.1 mg/dL — ABNORMAL HIGH (ref 2.5–4.6)
Potassium: 3.7 mmol/L (ref 3.5–5.1)
Sodium: 136 mmol/L (ref 135–145)

## 2023-04-15 LAB — URINALYSIS, ROUTINE W REFLEX MICROSCOPIC
Bacteria, UA: NONE SEEN
Bilirubin Urine: NEGATIVE
Glucose, UA: 50 mg/dL — AB
Hgb urine dipstick: NEGATIVE
Ketones, ur: NEGATIVE mg/dL
Leukocytes,Ua: NEGATIVE
Nitrite: NEGATIVE
Protein, ur: >=300 mg/dL — AB
Specific Gravity, Urine: 1.012 (ref 1.005–1.030)
pH: 5 (ref 5.0–8.0)

## 2023-04-15 LAB — SODIUM, URINE, RANDOM: Sodium, Ur: 22 mmol/L

## 2023-04-15 LAB — MAGNESIUM: Magnesium: 2.2 mg/dL (ref 1.7–2.4)

## 2023-04-15 LAB — CREATININE, URINE, RANDOM: Creatinine, Urine: 143 mg/dL

## 2023-04-15 MED ORDER — FUROSEMIDE 10 MG/ML IJ SOLN
120.0000 mg | Freq: Three times a day (TID) | INTRAVENOUS | Status: DC
  Administered 2023-04-15 – 2023-04-16 (×3): 120 mg via INTRAVENOUS
  Filled 2023-04-15: qty 12
  Filled 2023-04-15: qty 10
  Filled 2023-04-15 (×2): qty 12
  Filled 2023-04-15: qty 2

## 2023-04-15 MED ORDER — SODIUM BICARBONATE 650 MG PO TABS
650.0000 mg | ORAL_TABLET | Freq: Two times a day (BID) | ORAL | Status: DC
  Administered 2023-04-15 – 2023-04-17 (×5): 650 mg via ORAL
  Filled 2023-04-15 (×5): qty 1

## 2023-04-15 MED ORDER — AMLODIPINE BESYLATE 5 MG PO TABS
5.0000 mg | ORAL_TABLET | Freq: Every day | ORAL | Status: DC
  Administered 2023-04-16 – 2023-04-20 (×5): 5 mg via ORAL
  Filled 2023-04-15 (×6): qty 1

## 2023-04-15 MED ORDER — ISOSORB DINITRATE-HYDRALAZINE 20-37.5 MG PO TABS
1.0000 | ORAL_TABLET | Freq: Three times a day (TID) | ORAL | Status: DC
  Administered 2023-04-15 – 2023-04-22 (×19): 1 via ORAL
  Filled 2023-04-15 (×20): qty 1

## 2023-04-15 NOTE — Progress Notes (Signed)
Mobility Specialist Progress Note    04/15/23 1119  Mobility  Activity Ambulated with assistance in hallway  Level of Assistance Contact guard assist, steadying assist  Assistive Device [place];Other (Comment) (HHA, hallway railings)  Distance Ambulated (ft) 300 ft  Activity Response Tolerated fair  Mobility Referral Yes  $Mobility charge 1 Mobility  Mobility Specialist Start Time (ACUTE ONLY) 1103  Mobility Specialist Stop Time (ACUTE ONLY) 1118  Mobility Specialist Time Calculation (min) (ACUTE ONLY) 15 min   Pre-Mobility: 76 HR, 122/59 (77) BP Post-Mobility: 75 HR, 113/55 (71) BP  Pt received in bed and agreeable. C/o lightheadedness throughout session. Took multiple short standing rest breaks and relied on hallway railings for extra assistance. Returned to bed with call bell in reach. Pt stated the lightheadedness was still the same and has been happening since her fistula placement.    [place] Mobility Specialist  Please Neurosurgeon or Rehab Office at [phone]

## 2023-04-15 NOTE — Consult Note (Signed)
Renal Service Consult Note [facility]  [name] 04/15/2023 Maree Krabbe, MD Requesting Physician: Dr. Annetta Maw  Reason for Consult: Renal failure  HPI: The patient is a 52 y.o. year-old w/ PMH as below who presented to ED on 9/04 w/ c/o SOB / DOE.  Had AVF placed last week and has been feeling worse the last few days. Is taking lasix 20 bid. She has been using her CPAP device after walking outside to help her "catch my breath". In ED creat 4.5, bun 46, K+ 3.3, CXR showed vas congestion and early edema. ECHO showed LVEF 60-65%. Pt was admitted. She rec'd IV lasix 40mg  1st day and 80mg  the 2nd days. Has had about 2.2 L of UOP since admit 4 days ago, and 800 cc po in. Creat has increased up to 5.5 today. We are asked to see for renal failure.   Pt seen in room. SOB/ DOE not much better since admission she says. Not in distress. No leg swelling pre admission or now.  No n/v/d, good appetite , no loss of energy, dysgeusia.     ROS - denies CP, no joint pain, no HA, no blurry vision, no rash, no diarrhea, no nausea/ vomiting, no dysuria, no difficulty voiding   Past Medical History  Past Medical History:  Diagnosis Date   Anemia    Anxiety    Arthritis    Asthma    Cataract    Mixed form OU   CKD (chronic kidney disease)    Coronary artery disease    Depression    Diabetes mellitus    Diabetic retinopathy (HCC)    NPDR OU   Dyspnea    Hyperlipidemia    Hypertension    Hypertensive retinopathy    OU   Left thyroid nodule    diagnosed 07/2018   PAC (premature atrial contraction) 02/15/2021   Pseudotumor cerebri    Sleep apnea    Uses a cpap   Stroke (HCC)    Vitamin D deficiency    Past Surgical History  Past Surgical History:  Procedure Laterality Date   ACHILLES TENDON REPAIR Right    ACHILLES TENDON SURGERY Left 02/19/2021   Procedure: ACHILLES TENDON REPAIR WITH GRAFT;  Surgeon: Candelaria Stagers, DPM;  Location: [facility];  Service:  Podiatry;  Laterality: Left;  BLOCK   AMPUTATION TOE Right 05/28/2021   Procedure: AMPUTATION TOE;  Surgeon: Louann Sjogren, MD;  Location: [facility] OR;  Service: Podiatry;  Laterality: Right;  Surgical team will do block   AV FISTULA PLACEMENT Left 07/29/2022   Procedure: LEFT ARTERIOVENOUS (AV) FISTULA CREATION;  Surgeon: Maeola Harman, MD;  Location: [facility] OR;  Service: Vascular;  Laterality: Left;   AV FISTULA PLACEMENT Right 04/04/2023   Procedure: RIGHT ARM BRACHIOCEPHALIC ARTERIOVENOUS (AV) FISTULA CREATION;  Surgeon: Victorino Sparrow, MD;  Location: [facility] OR;  Service: Vascular;  Laterality: Right;  With regional block   GASTROC RECESSION EXTREMITY Left 02/19/2021   Procedure: GASTROC RECESSION EXTREMITY;  Surgeon: Candelaria Stagers, DPM;  Location: [facility];  Service: Podiatry;  Laterality: Left;  Block   TOE SURGERY Left 01/2023   TUBAL LIGATION     Family History  Family History  Problem Relation Age of Onset   Asthma Mother    Diabetes Father    Hypertension Father    Kidney disease Father    Heart attack Father    Heart failure Father    Hypertension Sister  Asthma Sister    Congestive Heart Failure Maternal Aunt    Diabetes Maternal Grandmother    Congestive Heart Failure Maternal Grandmother    Lung disease Maternal Grandmother    Asthma Other    Hyperlipidemia Other    Hypertension Other    Cancer Other    Social History  reports that she quit smoking about 7 years ago. Her smoking use included cigarettes. She started smoking about 37 years ago. She has a 15 pack-year smoking history. She has been exposed to tobacco smoke. She has never used smokeless tobacco. She reports that she does not currently use alcohol. She reports that she does not currently use drugs after having used the following drugs: Marijuana. Allergies  Allergies  Allergen Reactions   Bee Pollen Other (See Comments)    Seasonal Allergies   Dust Mite Extract Cough    Sneezing &  Cough   Mixed Ragweed Itching   Home medications Prior to Admission medications   Medication Sig Start Date End Date Taking? Authorizing Provider  albuterol (VENTOLIN HFA) 108 (90 Base) MCG/ACT inhaler Inhale 1-2 puffs into the lungs every 6 (six) hours as needed for wheezing or shortness of breath (Asthma).   Yes [provider]  amLODipine (NORVASC) 10 MG tablet Take 1 tablet (10 mg total) by mouth daily. 01/20/23 07/19/23 Yes Patwardhan, Anabel Bene, MD  aspirin 81 MG EC tablet Take 1 tablet (81 mg total) by mouth daily. 10/14/20  Yes Patwardhan, Manish J, MD  calcitRIOL (ROCALTROL) 0.25 MCG capsule Take 0.25 mcg by mouth daily. 10/07/22  Yes [provider]  EPINEPHrine 0.3 mg/0.3 mL IJ SOAJ injection Inject 0.3 mg into the muscle as needed for anaphylaxis. 12/08/21  Yes Redwine, Madison A, PA-C  furosemide (LASIX) 20 MG tablet Take 20 mg by mouth 2 (two) times daily.   Yes [provider]  insulin aspart protamine - aspart (NOVOLOG 70/30 MIX) (70-30) 100 UNIT/ML FlexPen Inject 30-40 Units into the skin 2 (two) times daily with a meal. Patient taking differently: Inject 30-40 Units into the skin See admin instructions. Inject 40 units subcutaneous in the morning, then inject 30 units subcutaneous in the evening per patient 12/21/22  Yes Nida, Denman George, MD  isosorbide-hydrALAZINE (BIDIL) 20-37.5 MG tablet Take 1 tablet by mouth 3 (three) times daily. Patient taking differently: Take 2 tablets by mouth 3 (three) times daily. 04/23/22  Yes Almon Hercules, MD  labetalol (NORMODYNE) 300 MG tablet Take 300 mg by mouth 2 (two) times daily. 08/16/21  Yes [provider]  rosuvastatin (CRESTOR) 40 MG tablet Take 40 mg by mouth in the morning.   Yes [provider]  Semaglutide,0.25 or 0.5MG /DOS, (OZEMPIC, 0.25 OR 0.5 MG/DOSE,) 2 MG/3ML SOPN Inject 0.25 mg into the skin once a week. 03/28/23  Yes Nida, Denman George, MD  Accu-Chek FastClix Lancets MISC 1 each by  Other route 4 (four) times daily. 07/04/22   Roma Kayser, MD  ACCU-CHEK GUIDE test strip 1 each by Other route 4 (four) times daily. for testing 07/04/22   Roma Kayser, MD  blood glucose meter kit and supplies KIT Dispense based on patient and insurance preference. Use up to four times daily as directed. (FOR ICD-9 250.00, 250.01). 04/12/18   Glenford Bayley, NP  Blood Glucose Monitoring Suppl (ACCU-CHEK GUIDE ME) w/Device KIT Use to check blood glucose four times daily 07/04/22   Nida, Denman George, MD  Continuous Blood Gluc Receiver (DEXCOM G7 RECEIVER) DEVI Use to  monitor BG continuously 04/13/22   Roma Kayser, MD  Continuous Glucose Sensor (DEXCOM G7 SENSOR) MISC Change sensor every 10 days 12/21/22   Roma Kayser, MD  Insulin Pen Needle 32G X 4 MM MISC Use to inject insulin twice daily 01/23/23   Roma Kayser, MD  Insulin Syringes, Disposable, U-100 1 ML MISC 1 application by Does not apply route 2 (two) times a day. 02/20/19   Jacquelynn Cree, PA-C     Vitals:   04/15/23 0045 04/15/23 0427 04/15/23 0800 04/15/23 1117  BP: (!) 117/59 111/66 124/65 (!) 113/55  Pulse: 81 80 80 75  Resp: 18 17 20 20   Temp: 97.9 F (36.6 C) (!) 97.5 F (36.4 C) 98.2 F (36.8 C) 98.6 F (37 C)  TempSrc: Oral Axillary Oral Oral  SpO2: 97% 93% 96% 96%  Weight:  114.3 kg    Height:       Exam Gen alert, no distress No rash, cyanosis or gangrene Sclera anicteric, throat clear  No jvd or bruits Chest bilat basilar rales, R > L  RRR no MRG Abd soft ntnd no mass or ascites +bs MS no joint effusions or deformity Ext no LE or UE edema, no wounds or ulcers Neuro is alert, Ox 3 , nf, no asterixis    Home meds include - albuterol, norvasc 10, aspirin, rocaltrol 0.25 mcg, lasix 20 bid, insulin 70/30, bidil (20-37.5mg ) 2 tabs tid, labetalol 300 bid, crestor, semaglutide,        Date  Creat  eGFR      2009- 2016  0.5- 0.8      2020  1.34- 1.58       2021  1.36- 2.33       2022  1.69-  2.13 30- 37 ml/min      Jan- jun 2023 2.92  19 ml/min      Jul - dec 2023 3.46- 5.20 11- 46ml/min      Mar 2024  3.62  15      April   4.00      May      5.16       June  5.03  10       July   4.56  11        9/04- 9/07  4.58 >> 5.58 This admission        UA pending      UNa, UCr pending           Assessment/ Plan: AKI on CKD 5 - b/l creat 4.5- 5.0 from jun- July 2024, EGFR 10- 11 ml/min. Had R arm AVF placed 1 week ago. Creat here was 4.5 on admission and is now up to 5.5. Pt admitted for SOB/ pulm edema/ DOE and rec'd IV lasix 40- 80mg  bid for 2 days then was held due to rising creatinine. Pt's DOE is no better than on admission and her wt's are up. I/O since admit = 840 in and 2550 out = net neg 1.7 L.  BP's are normal to low normal. Suspect AKI related to volume overload vs progression of CKD 5.  She is not uremic so if volume can be controlled w/ diuretics/ fluid restriction we can hold off on dialysis. Will resume IV lasix at higher dose 120 tid. Will lower BP meds as would her BP's a bit higher while diuresing, and for AKI.  Get UA, urine lytes. Will follow.  Acute/ chronic diast CHF - per pmd HTN -  as above a bit over-controlled at this time, will lower BP meds for now.  DM2 H/o CVA      Rob Arlean Hopping  MD [facility] 04/15/2023, 1:38 PM  Recent Labs  Lab 04/12/23 1435 04/13/23 0252 04/14/23 0316 04/15/23 0306  HGB 8.2* 7.5*  --   --   ALBUMIN 3.1* 3.0*  --  3.0*  CALCIUM 8.0* 7.8* 7.8* 7.6*  PHOS  --  5.6*  --  5.1*  CREATININE 4.58* 4.75* 5.21* 5.58*  K 3.3* 3.1* 3.8 3.7   Inpatient medications:  amLODipine  10 mg Oral Daily   aspirin EC  81 mg Oral Daily   calcitRIOL  0.25 mcg Oral Daily   calcium acetate  667 mg Oral TID WC   heparin  5,000 Units Subcutaneous Q8H   insulin aspart  0-5 Units Subcutaneous QHS   insulin aspart  0-9 Units Subcutaneous TID WC   insulin aspart protamine- aspart  10 Units Subcutaneous BID WC    isosorbide-hydrALAZINE  2 tablet Oral TID   labetalol  300 mg Oral BID   rosuvastatin  40 mg Oral q AM   sodium bicarbonate  650 mg Oral BID   sodium chloride flush  3 mL Intravenous Q12H    acetaminophen **OR** acetaminophen, albuterol

## 2023-04-15 NOTE — Plan of Care (Signed)
  Problem: Fluid Volume: Goal: Ability to maintain a balanced intake and output will improve Outcome: Progressing   Problem: Clinical Measurements: Goal: Respiratory complications will improve Outcome: Progressing

## 2023-04-15 NOTE — Progress Notes (Signed)
Progress Note   Patient: [name] ACZ:[id number] DOB: 17-Jan-1971 DOA: 04/12/2023     3 DOS: the patient was seen and examined on 04/15/2023   Brief hospital course: [name] was admitted to the hospital with the working diagnosis of heart failure exacerbation.   52 yo female with the past medical history of hypertension, dyslipidemia, heart failure, CKD stage IV, and obesity class 2. Recent AV fistula creation for HD on 04/04/23.  Endorsed having progressive worsening dyspnea for the last 7 days, associated with palpitation, lightheadedness, and headaches. She has been not compliant taking furosemide at home. On her initial physical examination her blood pressure was 138/73, HR 81, RR 16 and 02 saturation 95%, lungs with rales but not wheezing, heart with S1 and S2 present and regular with no gallops, abdomen with no distention, no lower extremity edema. 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   Na 140, K 3,3 Cl 106 bicarbonate 19, glucose 102 bun 46 cr 4,58  BNP 494  Wbc 7.2 hgb 8,2 plt 215  Sars covid 19 negative    Chest radiograph with cardiomegaly, bilateral hilar vascular congestion with bilateral symmetric central interstitial infiltrates, small bilateral pleural effusions.   EKG 75 bpm, normal axis, qtc 502, sinus rhythm with sinus arrhythmia, no significant ST segment or T wave changes.   Patient has been placed on IV furosemide for diuresis.   09/07 patient is feeling better after IV furosemide, but serum cr has been rising. Consulted nephrology.   Assessment and Plan: * Acute on chronic diastolic CHF (congestive heart failure) (HCC) Echocardiogram with preserved LV systolic function with EF 60 to 65%, severe LVH, RV with preserved systolic function. Small pericardial effusion, posterior to the left ventricle. No significant valvular disease.   Urine output is 950  ml, since admission fluid balance negative -,1710 ml. Systolic blood  pressure 111 to 124 mmHg.   Afterload reduction with hydralazine and isosorbide. Limited pharmacologic therapy due to reduced GFR.  Possible transition to oral diuretic therapy, possible torsemide 60 mg daily.  CKD (chronic kidney disease), stage IV (HCC) AKI, Hypokalemia. Hypomagnesemia   Continue to feeling better. She has been oxygenating well, with 02 saturation 96% on room air.  On exam with no significant edema.  Fluid balance is negative -1,710 ml since admission.   Renal function with serum cr at 5,58 with K at 3,7 and serum bicarbonate at 19. Na 136 and P 5.   Possible patient at new baseline. Probably time to resume oral loop diuretic therapy. Considering her low GFR, possibly 60 mg torsemide daily and have close follow up as outpatient.  (At home patient has been on 40 mg furosemide per day).  Follow up renal function in am.  Will consult Nephrology for recommendations.   Metabolic bone disease, continue with phosphate binders.  Continue with calcitriol.  Non anion gap metabolic acidosis, add oral sodium bicarbonate.   Anemia of chronic disease, stable Hgb.   Essential hypertension, benign Continue blood pressure control with labetalol, hydralazine, isosorbide and amlodipine.   Type 2 diabetes mellitus with hyperlipidemia (HCC) Continue with low dose of basal insulin 70/30: 10 units bid. Continue insulin sliding scale for glucose cover and monitoring.  Fasting glucose today 135 mg/dl.   Continue statin therapy.   H/O: stroke Continue blood pressure control.  Continue with aspirin and statin.   Obstructive sleep apnea Cpap  Class 2 obesity Calculated BMI 36.2  Subjective: Patient is feeling better, dyspnea has improved, mentioned feeling 90% of her baseline.   Physical Exam: Vitals:   04/14/23 2034 04/15/23 0045 04/15/23 0427 04/15/23 0800  BP: (!) 122/54 (!) 117/59 111/66 124/65  Pulse: 86 81 80 80  Resp: 17 18 17 20   Temp: 98.8 F (37.1  C) 97.9 F (36.6 C) (!) 97.5 F (36.4 C) 98.2 F (36.8 C)  TempSrc: Oral Oral Axillary Oral  SpO2: 93% 97% 93% 96%  Weight:   114.3 kg   Height:       Neurology awake and alert  ENT with mild pallor Cardiovascular with S1 and S2 present and regular with no gallops, rubs or murmurs Respiratory with no rales or wheezing Abdomen with no distention  No lower extremity edema  Data Reviewed:    Family Communication: no family at the bedside   Disposition: Status is: Inpatient Remains inpatient appropriate because: high serum cr   Planned Discharge Destination: Home     Author: Coralie Keens, MD 04/15/2023 9:29 AM  For on call review [email or web address].

## 2023-04-16 ENCOUNTER — Inpatient Hospital Stay (HOSPITAL_COMMUNITY): Payer: Medicaid Other

## 2023-04-16 DIAGNOSIS — I5033 Acute on chronic diastolic (congestive) heart failure: Secondary | ICD-10-CM | POA: Diagnosis not present

## 2023-04-16 DIAGNOSIS — I10 Essential (primary) hypertension: Secondary | ICD-10-CM | POA: Diagnosis not present

## 2023-04-16 DIAGNOSIS — N184 Chronic kidney disease, stage 4 (severe): Secondary | ICD-10-CM | POA: Diagnosis not present

## 2023-04-16 DIAGNOSIS — E1169 Type 2 diabetes mellitus with other specified complication: Secondary | ICD-10-CM | POA: Diagnosis not present

## 2023-04-16 LAB — RENAL FUNCTION PANEL
Albumin: 3 g/dL — ABNORMAL LOW (ref 3.5–5.0)
Anion gap: 13 (ref 5–15)
BUN: 48 mg/dL — ABNORMAL HIGH (ref 6–20)
CO2: 19 mmol/L — ABNORMAL LOW (ref 22–32)
Calcium: 8.3 mg/dL — ABNORMAL LOW (ref 8.9–10.3)
Chloride: 103 mmol/L (ref 98–111)
Creatinine, Ser: 5.97 mg/dL — ABNORMAL HIGH (ref 0.44–1.00)
GFR, Estimated: 8 mL/min — ABNORMAL LOW (ref >60)
Glucose, Bld: 142 mg/dL — ABNORMAL HIGH (ref 70–99)
Phosphorus: 6.1 mg/dL — ABNORMAL HIGH (ref 2.5–4.6)
Potassium: 3.7 mmol/L (ref 3.5–5.1)
Sodium: 135 mmol/L (ref 135–145)

## 2023-04-16 LAB — GLUCOSE, CAPILLARY
Glucose-Capillary: 141 mg/dL — ABNORMAL HIGH (ref 70–99)
Glucose-Capillary: 135 mg/dL — ABNORMAL HIGH (ref 70–99)
Glucose-Capillary: 148 mg/dL — ABNORMAL HIGH (ref 70–99)
Glucose-Capillary: 169 mg/dL — ABNORMAL HIGH (ref 70–99)

## 2023-04-16 MED ORDER — BISACODYL 5 MG PO TBEC
5.0000 mg | DELAYED_RELEASE_TABLET | Freq: Once | ORAL | Status: AC
  Administered 2023-04-16: 5 mg via ORAL
  Filled 2023-04-16: qty 1

## 2023-04-16 MED ORDER — METOLAZONE 5 MG PO TABS
5.0000 mg | ORAL_TABLET | Freq: Every day | ORAL | Status: DC
  Administered 2023-04-16 – 2023-04-21 (×6): 5 mg via ORAL
  Filled 2023-04-16 (×6): qty 1

## 2023-04-16 MED ORDER — POLYETHYLENE GLYCOL 3350 17 G PO PACK
17.0000 g | PACK | Freq: Two times a day (BID) | ORAL | Status: DC
  Administered 2023-04-16 (×2): 17 g via ORAL
  Filled 2023-04-16 (×7): qty 1

## 2023-04-16 MED ORDER — FUROSEMIDE 10 MG/ML IJ SOLN
160.0000 mg | Freq: Three times a day (TID) | INTRAVENOUS | Status: DC
  Administered 2023-04-16 – 2023-04-18 (×6): 160 mg via INTRAVENOUS
  Filled 2023-04-16 (×2): qty 16
  Filled 2023-04-16: qty 10
  Filled 2023-04-16: qty 16
  Filled 2023-04-16 (×2): qty 10
  Filled 2023-04-16: qty 4
  Filled 2023-04-16: qty 16

## 2023-04-16 NOTE — Progress Notes (Addendum)
Progress Note   Patient: [name] [id number]:[id number] DOB: 08/25/1970 DOA: 04/12/2023     4 DOS: the patient was seen and examined on 04/16/2023   Brief hospital course: Mrs. Cadman was admitted to the hospital with the working diagnosis of heart failure exacerbation.   52 yo female with the past medical history of hypertension, dyslipidemia, heart failure, CKD stage IV, and obesity class 2. Recent AV fistula creation for HD on 04/04/23.  Endorsed having progressive worsening dyspnea for the last 7 days, associated with palpitation, lightheadedness, and headaches. She has been not compliant taking furosemide at home. On her initial physical examination her blood pressure was 138/73, HR 81, RR 16 and 02 saturation 95%, lungs with rales but not wheezing, heart with S1 and S2 present and regular with no gallops, abdomen with no distention, no lower extremity edema. 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   Na 140, K 3,3 Cl 106 bicarbonate 19, glucose 102 bun 46 cr 4,58  BNP 494  Wbc 7.2 hgb 8,2 plt 215  Sars covid 19 negative    Chest radiograph with cardiomegaly, bilateral hilar vascular congestion with bilateral symmetric central interstitial infiltrates, small bilateral pleural effusions.   EKG 75 bpm, normal axis, qtc 502, sinus rhythm with sinus arrhythmia, no significant ST segment or T wave changes.   Patient has been placed on IV furosemide for diuresis.   09/07 patient is feeling better after IV furosemide, but serum cr has been rising. Consulted nephrology.  09/08 resumed IV furosemide.  Assessment and Plan: * Acute on chronic diastolic CHF (congestive heart failure) (HCC) Echocardiogram with preserved LV systolic function with EF 60 to 65%, severe LVH, RV with preserved systolic function. Small pericardial effusion, posterior to the left ventricle. No significant valvular disease.   Urine output is 1,500  ml, since admission fluid balance  negative -3,348  ml. Systolic blood pressure 136 to 141 mmHg.   Afterload reduction with hydralazine and isosorbide. Labetalol 300 mg bid. Limited pharmacologic therapy due to reduced GFR.  Diuretic therapy with furosemide 160 mg IV q8hrs   CKD (chronic kidney disease), stage IV (HCC) AKI, Hypokalemia. Hypomagnesemia   Continue to feeling better. She has been oxygenating well, with 02 saturation 92% on room air.  On exam with no significant edema.  Increased urine output after high dose of furosemide.   Renal function with serum cr at 5,97 with K at 3,7 and serum bicarbonate at 19. Na 135 and P 6.1 Urinary Na 22  with specific gravity 1,012, protein > 300 with no leukocytes and negative Hgb.   Likely progressive renal failure with loss of further renal function.  Plan to continue volume control with loop diuretic therapy (160 mg IV q 8 hrs) and thiazide (metolazone 5 mg daily).  Need to follow serum cr trend prior to discharge.   Metabolic bone disease, continue with phosphate binders.  Continue with calcitriol.  Non anion gap metabolic acidosis, continue with oral sodium bicarbonate.   Anemia of chronic disease, stable Hgb.   Essential hypertension, benign Continue blood pressure control with labetalol, hydralazine, isosorbide and amlodipine.   Type 2 diabetes mellitus with hyperlipidemia (HCC) Continue with low dose of basal insulin 70/30: 10 units bid. Continue insulin sliding scale for glucose cover and monitoring.  Fasting glucose today 142 mg/dl.   Continue statin therapy.   H/O: stroke Continue blood pressure control.  Continue with aspirin and statin.   Obstructive sleep apnea Cpap  Class 2 obesity Calculated BMI 36.2         Subjective: Patient with no chest pain, no worsening dyspnea or edema.   Physical Exam: Vitals:   04/15/23 2129 04/16/23 0000 04/16/23 0347 04/16/23 0924  BP: (!) 136/59  123/62 (!) 141/74  Pulse: 82   77  Resp: 16 18 19     Temp: 99 F (37.2 C) 98.4 F (36.9 C) 97.9 F (36.6 C)   TempSrc: Oral  Oral   SpO2: 92%     Weight:   115.3 kg   Height:       Neurology awake and alert ENT with mild pallor with no icterus Cardiovascular with S1 and S2 present and regular with no gallops, rubs or murmurs Respiratory with rales at bases with no wheezing or rhonchi Abdomen with no distention  No lower extremity edema  Data Reviewed:    Family Communication: no family at the bedside   Disposition: Status is: Inpatient Remains inpatient appropriate because: losing renal function   Planned Discharge Destination: Home    Author: Coralie Keens, MD 04/16/2023 10:33 AM  For on call review [email or web address].

## 2023-04-16 NOTE — Progress Notes (Signed)
[facility] Progress Note  Subjective: VSS, BP's good, UOP 1.4 L yesterday, 600 this am. Does not feel like SOB/ orthopnea is any better today. Wt's are up to 115. I have written order for fluid restriction.   Vitals:   04/16/23 0000 04/16/23 0347 04/16/23 0924 04/16/23 1212  BP:  123/62 (!) 141/74 121/62  Pulse:   77 72  Resp: 18 19  18   Temp: 98.4 F (36.9 C) 97.9 F (36.6 C)  97.8 F (36.6 C)  TempSrc:  Oral  Oral  SpO2:    93%  Weight:  115.3 kg    Height:        Exam: Gen alert, no distress, remains sitting straight up bed Not visibly SOB No jvd or bruits Chest bilat basilar rales RRR no MRG Abd soft ntnd no mass or ascites +bs Ext trace pretib edema Neuro is alert, Ox 3 , nf, no asterixis      Home meds include - albuterol, norvasc 10, aspirin, rocaltrol 0.25 mcg, lasix 20 bid, insulin 70/30, bidil (20-37.5mg ) 2 tabs tid, labetalol 300 bid, crestor, semaglutide,         Date                       Creat               eGFR      2009- 2016             0.5- 0.8      2020                       1.34- 1.58      2021                       1.36- 2.33              2022                       1.69-  2.13       30- 37 ml/min      Jan- jun 2023         2.92                 19 ml/min      Jul - dec 2023        3.46- 5.20        11- 15ml/min      Mar 2024                3.62                 15      April                        4.00      May                        5.16       June                      5.03                 10       July  4.56                 11                          9/04- 9/07               4.58 >> 5.58    This admission        UA 9/07 - prot >300, 0-5 rbc/ wbc, 11-20 epis, no bact      UNa 22,  UCr 143       Renal US - 10.9/ 11.5 cm kidneys w/o hydronephrosis       Assessment/ Plan: AKI on CKD 5 - b/l creat 4.5- 5.0 from jun- July 2024, EGFR 10- 11 ml/min. Had R arm AVF placed 1 week ago in prep for suspected ESRD  soon. Pt was admitted for SOB/ pulm edema/ DOE and rec'd IV lasix 40- 80mg  bid for the 1st 2 days here. Creat was 4.5 on admission but then worsened and the IV lasix was held. Creat cont'd to increase up to 5.5. UA was negative except for ^protein. Renal US neg for obstruction. Suspected AKI related to decomp CHF/ volume overload vs progression of CKD 5. IV lasix was resumed at a higher dose 120mg  IV tid. BP lowering meds were lowered a bit to provide higher BP's while diuresing. Today her SOB is no better. She is diuresing but not sig large amts, and her wt is still not down. Have ordered a fluid restriction, increasing IV Lasix to 160 mg tid and adding po metolazone. She may end up needing dialysis if cannot get a better diuretic response. Will follow closely.  Acute/ chronic diast CHF - as above, vol overload w/ SOB HTN - lowered Bidil and norvasc dosing, also getting labetalol bid. BP's are stable today.  DM2 H/o CVA       Rob Shail Urbas MD  [facility] 04/16/2023, 1:25 PM  Recent Labs  Lab 04/12/23 1435 04/12/23 1435 04/13/23 0252 04/14/23 0316 04/15/23 0306 04/16/23 0254  HGB 8.2*  --  7.5*  --   --   --   ALBUMIN 3.1*  --  3.0*  --  3.0* 3.0*  CALCIUM 8.0*  --  7.8*   < > 7.6* 8.3*  PHOS  --    < > 5.6*  --  5.1* 6.1*  CREATININE 4.58*  --  4.75*   < > 5.58* 5.97*  K 3.3*  --  3.1*   < > 3.7 3.7   < > = values in this interval not displayed.   No results for input(s): "IRON", "TIBC", "FERRITIN" in the last 168 hours. Inpatient medications:  amLODipine  5 mg Oral Daily   aspirin EC  81 mg Oral Daily   calcitRIOL  0.25 mcg Oral Daily   calcium acetate  667 mg Oral TID WC   heparin  5,000 Units Subcutaneous Q8H   insulin aspart  0-5 Units Subcutaneous QHS   insulin aspart  0-9 Units Subcutaneous TID WC   insulin aspart protamine- aspart  10 Units Subcutaneous BID WC   isosorbide-hydrALAZINE  1 tablet Oral TID   labetalol  300 mg Oral BID   metolazone  5 mg Oral Daily   polyethylene  glycol  17 g Oral BID   rosuvastatin  40 mg Oral q AM   sodium bicarbonate  650 mg Oral BID   sodium chloride flush  3 mL Intravenous Q12H  furosemide     acetaminophen **OR** acetaminophen, albuterol

## 2023-04-17 DIAGNOSIS — I5033 Acute on chronic diastolic (congestive) heart failure: Secondary | ICD-10-CM | POA: Diagnosis not present

## 2023-04-17 DIAGNOSIS — N184 Chronic kidney disease, stage 4 (severe): Secondary | ICD-10-CM | POA: Diagnosis not present

## 2023-04-17 DIAGNOSIS — I10 Essential (primary) hypertension: Secondary | ICD-10-CM | POA: Diagnosis not present

## 2023-04-17 DIAGNOSIS — E1169 Type 2 diabetes mellitus with other specified complication: Secondary | ICD-10-CM | POA: Diagnosis not present

## 2023-04-17 LAB — RENAL FUNCTION PANEL
Albumin: 3 g/dL — ABNORMAL LOW (ref 3.5–5.0)
Anion gap: 11 (ref 5–15)
BUN: 52 mg/dL — ABNORMAL HIGH (ref 6–20)
CO2: 23 mmol/L (ref 22–32)
Calcium: 8.3 mg/dL — ABNORMAL LOW (ref 8.9–10.3)
Chloride: 102 mmol/L (ref 98–111)
Creatinine, Ser: 5.87 mg/dL — ABNORMAL HIGH (ref 0.44–1.00)
GFR, Estimated: 8 mL/min — ABNORMAL LOW (ref >60)
Glucose, Bld: 138 mg/dL — ABNORMAL HIGH (ref 70–99)
Phosphorus: 6.2 mg/dL — ABNORMAL HIGH (ref 2.5–4.6)
Potassium: 3.7 mmol/L (ref 3.5–5.1)
Sodium: 136 mmol/L (ref 135–145)

## 2023-04-17 LAB — CBC
HCT: 22.9 % — ABNORMAL LOW (ref 36.0–46.0)
Hemoglobin: 7.4 g/dL — ABNORMAL LOW (ref 12.0–15.0)
MCH: 29.6 pg (ref 26.0–34.0)
MCHC: 32.3 g/dL (ref 30.0–36.0)
MCV: 91.6 fL (ref 80.0–100.0)
Platelets: 222 10*3/uL (ref 150–400)
RBC: 2.5 MIL/uL — ABNORMAL LOW (ref 3.87–5.11)
RDW: 15.1 % (ref 11.5–15.5)
WBC: 7 10*3/uL (ref 4.0–10.5)
nRBC: 0 % (ref 0.0–0.2)

## 2023-04-17 LAB — IRON AND TIBC
Iron: 50 ug/dL (ref 28–170)
Saturation Ratios: 16 % (ref 10.4–31.8)
TIBC: 309 ug/dL (ref 250–450)
UIBC: 259 ug/dL

## 2023-04-17 LAB — GLUCOSE, CAPILLARY
Glucose-Capillary: 142 mg/dL — ABNORMAL HIGH (ref 70–99)
Glucose-Capillary: 221 mg/dL — ABNORMAL HIGH (ref 70–99)
Glucose-Capillary: 159 mg/dL — ABNORMAL HIGH (ref 70–99)
Glucose-Capillary: 144 mg/dL — ABNORMAL HIGH (ref 70–99)

## 2023-04-17 LAB — FERRITIN: Ferritin: 249 ng/mL (ref 11–307)

## 2023-04-17 MED ORDER — SODIUM BICARBONATE 650 MG PO TABS
650.0000 mg | ORAL_TABLET | Freq: Every day | ORAL | Status: DC
  Administered 2023-04-18 – 2023-04-21 (×4): 650 mg via ORAL
  Filled 2023-04-17 (×4): qty 1

## 2023-04-17 NOTE — Progress Notes (Signed)
Progress Note   Patient: [name]:[id number] DOB: 23-Jun-1971 DOA: 04/12/2023     5 DOS: the patient was seen and examined on 04/17/2023   Brief hospital course: [name] was admitted to the hospital with the working diagnosis of heart failure exacerbation.   52 yo female with the past medical history of hypertension, dyslipidemia, heart failure, CKD stage IV, and obesity class 2. Recent AV fistula creation for HD on 04/04/23.  Endorsed having progressive worsening dyspnea for the last 7 days, associated with palpitation, lightheadedness, and headaches. She has been not compliant taking furosemide at home. On her initial physical examination her blood pressure was 138/73, HR 81, RR 16 and 02 saturation 95%, lungs with rales but not wheezing, heart with S1 and S2 present and regular with no gallops, abdomen with no distention, no lower extremity edema. 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   Na 140, K 3,3 Cl 106 bicarbonate 19, glucose 102 bun 46 cr 4,58  BNP 494  Wbc 7.2 hgb 8,2 plt 215  Sars covid 19 negative    Chest radiograph with cardiomegaly, bilateral hilar vascular congestion with bilateral symmetric central interstitial infiltrates, small bilateral pleural effusions.   EKG 75 bpm, normal axis, qtc 502, sinus rhythm with sinus arrhythmia, no significant ST segment or T wave changes.   Patient has been placed on IV furosemide for diuresis.   09/07 patient is feeling better after IV furosemide, but serum cr has been rising. Consulted nephrology.  09/08 resumed IV furosemide. 09/09 increased diuresis, serum cr still not at baseline.   Assessment and Plan: * Acute on chronic diastolic CHF (congestive heart failure) (HCC) Echocardiogram with preserved LV systolic function with EF 60 to 65%, severe LVH, RV with preserved systolic function. Small pericardial effusion, posterior to the left ventricle. No significant valvular disease.    Urine output is 2100 ml. Systolic blood pressure 135 to 125 mmHg.   Afterload reduction with hydralazine and isosorbide. Labetalol 300 mg bid. Limited pharmacologic therapy due to reduced GFR.  Diuretic therapy with furosemide 160 mg IV q8hrs   CKD (chronic kidney disease), stage IV (HCC) AKI, Hypokalemia. Hypomagnesemia   Patient had increased urine output.  Today complains of dyspnea on exertion, more than her baseline.   Continue to have elevated serum cr at 5,87 with K at 3,7 and serum bicarbonate at 23.  Na 136. P 6.2  Na 135 and P 6.1  Urinary Na 22  with specific gravity 1,012, protein > 300 with no leukocytes and negative Hgb.   Likely progressive renal failure with loss of further renal function.  Plan to continue volume control with loop diuretic therapy (160 mg IV q 8 hrs) and thiazide (metolazone 5 mg daily).  May need to start inpatient renal replacement therapy if no improvement in volume status.   Metabolic bone disease, continue with phosphate binders.  Continue with calcitriol.  Non anion gap metabolic acidosis, improved, will decease oral sodium bicarbonate.   Anemia of chronic disease, stable Hgb.   Essential hypertension, benign Continue blood pressure control with labetalol, hydralazine, isosorbide and amlodipine.   Type 2 diabetes mellitus with hyperlipidemia (HCC) Continue with low dose of basal insulin 70/30: 10 units bid. Continue insulin sliding scale for glucose cover and monitoring.  Fasting glucose today 138 mg/dl.   Continue statin therapy.   H/O: stroke Continue blood pressure control.  Continue with aspirin and statin.   Obstructive sleep apnea Cpap  Class 2 obesity Calculated BMI 36.2         Subjective: Patient complains today of dyspnea on exertion, symptoms while walking from bed to the restroom. No chest pain   Physical Exam: Vitals:   04/17/23 0155 04/17/23 0410 04/17/23 0713 04/17/23 1115  BP: 125/62 134/65 135/74  125/63  Pulse: 75 76 76 75  Resp: 20 20 18 18   Temp: 98.1 F (36.7 C) 98.6 F (37 C) 98.6 F (37 C) 98.6 F (37 C)  TempSrc: Oral Oral Oral Oral  SpO2: 92% 91% 95%   Weight:  113 kg    Height:       Neurology awake and alert ENT with mild pallor Cardiovascular with S1 and S2 present and regular with no gallops, rubs or murmurs Respiratory with no rales or wheezing, no rhonchi, decreased breath sounds at bases  Abdomen with no distention  Data Reviewed:   Family Communication: no family at the bedside   Disposition: Status is: Inpatient   Planned Discharge Destination: Home      Author: Coralie Keens, MD 04/17/2023 2:28 PM  For on call review [email or web address].

## 2023-04-17 NOTE — Progress Notes (Signed)
[facility] Progress Note  Subjective: VSS, BP's good, UOP 2.1 L yesterday. Still with orthopnea (though this sounds chronic) and DOE. Hb into 7s this AM.  No bleeding noted. CXR yesterday with pulm edema and small effusions  Vitals:   04/16/23 1921 04/17/23 0155 04/17/23 0410 04/17/23 0713  BP: 131/62 125/62 134/65 135/74  Pulse: 77 75 76 76  Resp: 20 20 20 18   Temp: 98.5 F (36.9 C) 98.1 F (36.7 C) 98.6 F (37 C) 98.6 F (37 C)  TempSrc: Oral Oral Oral Oral  SpO2: 94% 92% 91% 95%  Weight:   113 kg   Height:        Exam: Gen alert, no distress, remains sitting straight up bed  Not visibly SOB No jvd or bruits Chest bilat basilar rales RRR no MRG Abd soft ntnd no mass or ascites +bs Ext no pretib edema Neuro is alert, Ox 3 , nf, no asterixis      Home meds include - albuterol, norvasc 10, aspirin, rocaltrol 0.25 mcg, lasix 20 bid, insulin 70/30, bidil (20-37.5mg ) 2 tabs tid, labetalol 300 bid, crestor, semaglutide,         Date                       Creat               eGFR      2009- 2016             0.5- 0.8      2020                       1.34- 1.58      2021                       1.36- 2.33              2022                       1.69-  2.13       30- 37 ml/min      Jan- jun 2023         2.92                 19 ml/min      Jul - dec 2023        3.46- 5.20        11- 58ml/min      Mar 2024                3.62                 15      April                        4.00      May                        5.16       June                      5.03                 10       July  4.56                 11                          9/04- 9/07               4.58 >> 5.58    This admission        UA 9/07 - prot >300, 0-5 rbc/ wbc, 11-20 epis, no bact      UNa 22,  UCr 143       Renal US - 10.9/ 11.5 cm kidneys w/o hydronephrosis       Assessment/ Plan: AKI on CKD 5 - b/l creat 4.5- 5.0 from jun- July 2024, EGFR 10- 11 ml/min. Had R arm AVF  placed 1 week ago in prep for suspected ESRD soon. Pt was admitted for SOB/ pulm edema/ DOE and rec'd IV lasix 40- 80mg  bid for the 1st 2 days here. Creat was 4.5 on admission but then worsened and the IV lasix was held. Creat cont'd to increase up to 5.5 > 5.and stable today.  UA was negative except for ^protein. Renal US neg for obstruction. Suspected AKI related to decomp CHF/ volume overload vs progression of CKD 5. IV lasix was resumed at a higher dose 120mg  IV tid. BP lowering meds were lowered a bit to provide higher BP's while diuresing. Suboptimal diuresis requiring increasing IV Lasix doses to 160 mg tid and adding po metolazone 5 - improved and will continue today.  Will follow closely.  No current indications for dialysis but will eval day to day.  Acute/ chronic diast CHF - as above, vol overload w/ SOB HTN - lowered Bidil and norvasc dosing, also getting labetalol bid. BP's are stable today.  DM2 H/o CVA Anemia: Hb into 7s, contributing to DOE.  Iron was decent in 02/2023 but recheck and give IV iron +/- ESA.  No indication for transfusion currently.    Teresa Bakes MD [facility] Pager [phone]    Recent Labs  Lab 04/13/23 [id number] 04/14/23 0316 04/16/23 0254 04/17/23 0250  HGB 7.5*  --   --  7.4*  ALBUMIN 3.0*   < > 3.0* 3.0*  CALCIUM 7.8*   < > 8.3* 8.3*  PHOS 5.6*   < > 6.1* 6.2*  CREATININE 4.75*   < > 5.97* 5.87*  K 3.1*   < > 3.7 3.7   < > = values in this interval not displayed.   No results for input(s): "IRON", "TIBC", "FERRITIN" in the last 168 hours. Inpatient medications:  amLODipine  5 mg Oral Daily   aspirin EC  81 mg Oral Daily   calcitRIOL  0.25 mcg Oral Daily   calcium acetate  667 mg Oral TID WC   heparin  5,000 Units Subcutaneous Q8H   insulin aspart  0-5 Units Subcutaneous QHS   insulin aspart  0-9 Units Subcutaneous TID WC   insulin aspart protamine- aspart  10 Units Subcutaneous BID WC   isosorbide-hydrALAZINE  1 tablet Oral TID    labetalol  300 mg Oral BID   metolazone  5 mg Oral Daily   polyethylene glycol  17 g Oral BID   rosuvastatin  40 mg Oral q AM   sodium bicarbonate  650 mg Oral BID   sodium chloride flush  3 mL Intravenous Q12H    furosemide 160 mg (04/17/23 0612)   acetaminophen **OR** acetaminophen, albuterol

## 2023-04-17 NOTE — Plan of Care (Signed)
  Problem: Fluid Volume: Goal: Ability to maintain a balanced intake and output will improve Outcome: Progressing   

## 2023-04-17 NOTE — Progress Notes (Signed)
Mobility Specialist Progress Note:    04/17/23 1136  Mobility  Activity Ambulated with assistance in hallway  Level of Assistance Contact guard assist, steadying assist  Assistive Device [place];Other (Comment) (Hand railing)  Distance Ambulated (ft) 100 ft  Activity Response Tolerated well  Mobility Referral Yes  $Mobility charge 1 Mobility  Mobility Specialist Start Time (ACUTE ONLY) 1100  Mobility Specialist Stop Time (ACUTE ONLY) 1117  Mobility Specialist Time Calculation (min) (ACUTE ONLY) 17 min   Pt received sitting up in bed, agreeable to ambulate. No physical assistance needed throughout session just a contact guard d/t pt c/o of lightheadedness., otherwise no c/o. Returned to room w/o fault. All needs met w/ call bell in reach.   Post Mobility BP 125/63  Thompson Grayer Mobility Specialist  Please contact vis Secure Chat or  Rehab Office [phone]

## 2023-04-17 NOTE — Progress Notes (Signed)
   04/17/23 2307  BiPAP/CPAP/SIPAP  Reason BIPAP/CPAP not in use Other(comment) (standby will go on later)

## 2023-04-18 DIAGNOSIS — E1169 Type 2 diabetes mellitus with other specified complication: Secondary | ICD-10-CM | POA: Diagnosis not present

## 2023-04-18 DIAGNOSIS — I5033 Acute on chronic diastolic (congestive) heart failure: Secondary | ICD-10-CM | POA: Diagnosis not present

## 2023-04-18 DIAGNOSIS — I10 Essential (primary) hypertension: Secondary | ICD-10-CM | POA: Diagnosis not present

## 2023-04-18 DIAGNOSIS — N184 Chronic kidney disease, stage 4 (severe): Secondary | ICD-10-CM | POA: Diagnosis not present

## 2023-04-18 LAB — RENAL FUNCTION PANEL
Albumin: 3.2 g/dL — ABNORMAL LOW (ref 3.5–5.0)
Anion gap: 14 (ref 5–15)
BUN: 60 mg/dL — ABNORMAL HIGH (ref 6–20)
CO2: 21 mmol/L — ABNORMAL LOW (ref 22–32)
Calcium: 8.4 mg/dL — ABNORMAL LOW (ref 8.9–10.3)
Chloride: 99 mmol/L (ref 98–111)
Creatinine, Ser: 6.2 mg/dL — ABNORMAL HIGH (ref 0.44–1.00)
GFR, Estimated: 8 mL/min — ABNORMAL LOW (ref >60)
Glucose, Bld: 126 mg/dL — ABNORMAL HIGH (ref 70–99)
Phosphorus: 6.3 mg/dL — ABNORMAL HIGH (ref 2.5–4.6)
Potassium: 3.5 mmol/L (ref 3.5–5.1)
Sodium: 134 mmol/L — ABNORMAL LOW (ref 135–145)

## 2023-04-18 LAB — GLUCOSE, CAPILLARY
Glucose-Capillary: 130 mg/dL — ABNORMAL HIGH (ref 70–99)
Glucose-Capillary: 174 mg/dL — ABNORMAL HIGH (ref 70–99)
Glucose-Capillary: 148 mg/dL — ABNORMAL HIGH (ref 70–99)
Glucose-Capillary: 173 mg/dL — ABNORMAL HIGH (ref 70–99)

## 2023-04-18 MED ORDER — SODIUM CHLORIDE 0.9 % IV SOLN
250.0000 mg | Freq: Every day | INTRAVENOUS | Status: AC
  Administered 2023-04-18 – 2023-04-19 (×2): 250 mg via INTRAVENOUS
  Filled 2023-04-18 (×2): qty 20

## 2023-04-18 MED ORDER — TORSEMIDE 100 MG PO TABS
100.0000 mg | ORAL_TABLET | Freq: Two times a day (BID) | ORAL | Status: DC
  Administered 2023-04-18 – 2023-04-20 (×5): 100 mg via ORAL
  Filled 2023-04-18 (×6): qty 1

## 2023-04-18 MED ORDER — DARBEPOETIN ALFA 100 MCG/0.5ML IJ SOSY
100.0000 ug | PREFILLED_SYRINGE | INTRAMUSCULAR | Status: DC
  Administered 2023-04-18: 100 ug via SUBCUTANEOUS
  Filled 2023-04-18: qty 0.5

## 2023-04-18 NOTE — Progress Notes (Signed)
PROGRESS NOTE    [name]  VWU:[id number] DOB: July 26, 1971 DOA: 04/12/2023 PCP: Jackie Plum, MD  52 yo female with the past medical history of hypertension, dyslipidemia, heart failure, CKD stage IV, and obesity class 2. Recent AV fistula creation for HD on 04/04/23 presented w/progressive worsening dyspnea, palpitations, lightheadedness, and headaches. In ED, bun 46 cr 4,58, BNP 494, hgb 8,2 CXR w/cardiomegaly, bilateral hilar vascular congestion with bilateral symmetric central interstitial infiltrates, small bilateral pleural effusions.  - placed on IV furosemide for diuresis.  -9/07 feeling better after IV furosemide, but serum cr has been rising. Consulted nephrology.  -9/08 resumed IV furosemide. -9/09 increased diuresis, serum cr still not at baseline.  -9/10 volume status more stable but continue to rise serum cr. Plan to transition to oral loop and thiazide diuretic therapy, if not good toleration, may need to start renal replacement therapy on this admission.   Subjective: -Feels better overall, still with some dyspnea and orthopnea  Assessment and Plan:  Acute on chronic diastolic CHF  -Echo EF 60 to 65%, severe LVH, RV with preserved systolic function. - hydralazine and isosorbide, Labetalol 300 mg bid. Limited pharmacologic therapy due to reduced GFR.  Transitioned to oral loop diuretic and thiazide with torsemide 100 mg bid and metolazone 5 mg daily.   -See discussion below  AKI on CKD 4 Hypokalemia. Hypomagnesemia  -Left arm aVF placed 2 weeks ago -Volume status improving -Now on oral torsemide, metolazone, still only volume overloaded, she is 6.4 L negative -Creatinine up to 6.5, await nephrology input, concern about difficulty maintaining euvolemia after discharge  Metabolic bone disease, continue with phosphate binders.  Continue with calcitriol, sodium bicarbonate.   Anemia of chronic disease, with iron deficiency. Serum iron 50, TIBC 309,  transferrin saturation 16 and ferritin 249  -Given IV iron and EPO  Essential hypertension, benign Continue labetalol, hydralazine, isosorbide and amlodipine.   Type 2 diabetes mellitus with hyperlipidemia (HCC) Continue with low dose of basal insulin 70/30: 10 units bid.  H/O: stroke -Stable condition, continue aspirin and statin  Obstructive sleep apnea Cpap  Class 2 obesity Calculated BMI 36.2    DVT prophylaxis: Hep SQ Code Status: Full Code Family Communication: Disposition Plan:   Consultants:    Procedures:   Antimicrobials:    Objective: Vitals:   04/18/23 0409 04/18/23 0623 04/18/23 0743 04/18/23 1112  BP: 131/65  (!) 157/83 127/62  Pulse: 75  74 70  Resp: 17  20 20   Temp: 98 F (36.7 C)  98.2 F (36.8 C) 97.8 F (36.6 C)  TempSrc: Oral  Oral Oral  SpO2: 94%  95% 93%  Weight:  113.6 kg    Height:        Intake/Output Summary (Last 24 hours) at 04/18/2023 1332 Last data filed at 04/18/2023 1247 Gross per 24 hour  Intake 960.35 ml  Output 2200 ml  Net -1239.65 ml   Filed Weights   04/16/23 0347 04/17/23 0410 04/18/23 0623  Weight: 115.3 kg 113 kg 113.6 kg    Examination: Gen: Awake, Alert, Oriented X 3,  HEENT: + JVD Lungs: Good air movement bilaterally, CTAB CVS: S1S2/RRR Abd: soft, Non tender, non distended, BS present Extremities: Trace edema Skin: no new rashes on exposed skin    Data Reviewed:   CBC: Recent Labs  Lab 04/12/23 1435 04/13/23 0252 04/17/23 0250  WBC 7.2 7.0 7.0  HGB 8.2* 7.5* 7.4*  HCT 24.9* 23.1* 22.9*  MCV 92.6 90.6 91.6  PLT 215 219 222  Basic Metabolic Panel: Recent Labs  Lab 04/13/23 0252 04/14/23 0316 04/15/23 0306 04/16/23 0254 04/17/23 0250 04/18/23 0311  NA 139 140 136 135 136 134*  K 3.1* 3.8 3.7 3.7 3.7 3.5  CL 102 104 101 103 102 99  CO2 20* 20* 19* 19* 23 21*  GLUCOSE 171* 81 135* 142* 138* 126*  BUN 49* 46* 45* 48* 52* 60*  CREATININE 4.75* 5.21* 5.58* 5.97* 5.87* 6.20*  CALCIUM  7.8* 7.8* 7.6* 8.3* 8.3* 8.4*  MG  --  1.5* 2.2  --   --   --   PHOS 5.6*  --  5.1* 6.1* 6.2* 6.3*   GFR: Estimated Creatinine Clearance: 14.6 mL/min (A) (by C-G formula based on SCr of 6.2 mg/dL (H)). Liver Function Tests: Recent Labs  Lab 04/12/23 1435 04/13/23 0252 04/15/23 0306 04/16/23 0254 04/17/23 0250 04/18/23 0311  AST 16  --   --   --   --   --   ALT 12  --   --   --   --   --   ALKPHOS 56  --   --   --   --   --   BILITOT 0.6  --   --   --   --   --   PROT 6.9  --   --   --   --   --   ALBUMIN 3.1* 3.0* 3.0* 3.0* 3.0* 3.2*   No results for input(s): "LIPASE", "AMYLASE" in the last 168 hours. No results for input(s): "AMMONIA" in the last 168 hours. Coagulation Profile: No results for input(s): "INR", "PROTIME" in the last 168 hours. Cardiac Enzymes: No results for input(s): "CKTOTAL", "CKMB", "CKMBINDEX", "TROPONINI" in the last 168 hours. BNP (last 3 results) No results for input(s): "PROBNP" in the last 8760 hours. HbA1C: No results for input(s): "HGBA1C" in the last 72 hours. CBG: Recent Labs  Lab 04/17/23 1049 04/17/23 1623 04/17/23 2105 04/18/23 0614 04/18/23 1110  GLUCAP 221* 159* 144* 130* 174*   Lipid Profile: No results for input(s): "CHOL", "HDL", "LDLCALC", "TRIG", "CHOLHDL", "LDLDIRECT" in the last 72 hours. Thyroid Function Tests: No results for input(s): "TSH", "T4TOTAL", "FREET4", "T3FREE", "THYROIDAB" in the last 72 hours. Anemia Panel: Recent Labs    04/17/23 0913  FERRITIN 249  TIBC 309  IRON 50   Urine analysis:    Component Value Date/Time   COLORURINE YELLOW 04/15/2023 1539   APPEARANCEUR HAZY (A) 04/15/2023 1539   LABSPEC 1.012 04/15/2023 1539   PHURINE 5.0 04/15/2023 1539   GLUCOSEU 50 (A) 04/15/2023 1539   HGBUR NEGATIVE 04/15/2023 1539   BILIRUBINUR NEGATIVE 04/15/2023 1539   KETONESUR NEGATIVE 04/15/2023 1539   PROTEINUR >=300 (A) 04/15/2023 1539   UROBILINOGEN 0.2 08/25/2011 2043   NITRITE NEGATIVE 04/15/2023  1539   LEUKOCYTESUR NEGATIVE 04/15/2023 1539   Sepsis Labs: @LABRCNTIP (procalcitonin:4,lacticidven:4)  ) Recent Results (from the past 240 hour(s))  Resp panel by RT-PCR (RSV, Flu A&B, Covid) Anterior Nasal Swab     Status: None   Collection Time: 04/12/23  2:35 PM   Specimen: Anterior Nasal Swab  Result Value Ref Range Status   SARS Coronavirus 2 by RT PCR NEGATIVE NEGATIVE Final   Influenza A by PCR NEGATIVE NEGATIVE Final   Influenza B by PCR NEGATIVE NEGATIVE Final    Comment: (NOTE) The Xpert Xpress SARS-CoV-2/FLU/RSV plus assay is intended as an aid in the diagnosis of influenza from Nasopharyngeal swab specimens and should not be used as a sole basis for  treatment. Nasal washings and aspirates are unacceptable for Xpert Xpress SARS-CoV-2/FLU/RSV testing.  Fact Sheet for Patients: BloggerCourse.com  Fact Sheet for Healthcare Providers: SeriousBroker.it  This test is not yet approved or cleared by the [place] FDA and has been authorized for detection and/or diagnosis of SARS-CoV-2 by FDA under an Emergency Use Authorization (EUA). This EUA will remain in effect (meaning this test can be used) for the duration of the COVID-19 declaration under Section 564(b)(1) of the Act, 21 U.S.C. section 360bbb-3(b)(1), unless the authorization is terminated or revoked.     Resp Syncytial Virus by PCR NEGATIVE NEGATIVE Final    Comment: (NOTE) Fact Sheet for Patients: BloggerCourse.com  Fact Sheet for Healthcare Providers: SeriousBroker.it  This test is not yet approved or cleared by the [place] FDA and has been authorized for detection and/or diagnosis of SARS-CoV-2 by FDA under an Emergency Use Authorization (EUA). This EUA will remain in effect (meaning this test can be used) for the duration of the COVID-19 declaration under Section 564(b)(1) of the Act, 21  U.S.C. section 360bbb-3(b)(1), unless the authorization is terminated or revoked.  Performed at [facility], [address] [place], [place] [address]      Radiology Studies: No results found.   Scheduled Meds:  amLODipine  5 mg Oral Daily   aspirin EC  81 mg Oral Daily   calcitRIOL  0.25 mcg Oral Daily   calcium acetate  667 mg Oral TID WC   darbepoetin (ARANESP) injection - NON-DIALYSIS  100 mcg Subcutaneous Q Tue-1800   heparin  5,000 Units Subcutaneous Q8H   insulin aspart  0-5 Units Subcutaneous QHS   insulin aspart  0-9 Units Subcutaneous TID WC   insulin aspart protamine- aspart  10 Units Subcutaneous BID WC   isosorbide-hydrALAZINE  1 tablet Oral TID   labetalol  300 mg Oral BID   metolazone  5 mg Oral Daily   polyethylene glycol  17 g Oral BID   rosuvastatin  40 mg Oral q AM   sodium bicarbonate  650 mg Oral Daily   sodium chloride flush  3 mL Intravenous Q12H   torsemide  100 mg Oral BID   Continuous Infusions:  ferric gluconate (FERRLECIT) IVPB 250 mg (04/18/23 1051)     LOS: 6 days    Time spent:     Zannie Cove, MD [facility]   04/18/2023, 1:32 PM

## 2023-04-18 NOTE — Progress Notes (Signed)
Mobility Specialist Progress Note:    04/18/23 1533  Mobility  Activity Ambulated with assistance in hallway  Level of Assistance Contact guard assist, steadying assist  Assistive Device Front wheel walker  Distance Ambulated (ft) 300 ft  Activity Response Tolerated well  Mobility Referral Yes  $Mobility charge 1 Mobility  Mobility Specialist Start Time (ACUTE ONLY) 1440  Mobility Specialist Stop Time (ACUTE ONLY) 1454  Mobility Specialist Time Calculation (min) (ACUTE ONLY) 14 min   Pt received in bed, agreeable to ambulate. Pt needed no physical assistance during session. Pt was offered a RW during this session d/t unsteadiness and needing to use cane and hand railings last session. Pt states this is much easier and was able to walk a farther distance this session.pt needed x3 standing breaks d/t fatigue and bilat arm pain. Offered a seated rest break, pt declined and was very motivated to ambulate back to room. Returned to room w/o fault. All needs met and call bell in reach.   Thompson Grayer Mobility Specialist  Please contact vis Secure Chat or  Rehab Office [phone]

## 2023-04-18 NOTE — Progress Notes (Signed)
Progress Note   Patient: [name] ZOX:[id number] DOB: 08/23/1970 DOA: 04/12/2023     6 DOS: the patient was seen and examined on 04/18/2023   Brief hospital course: Mrs. Vincente was admitted to the hospital with the working diagnosis of heart failure exacerbation.   52 yo female with the past medical history of hypertension, dyslipidemia, heart failure, CKD stage IV, and obesity class 2. Recent AV fistula creation for HD on 04/04/23.  Endorsed having progressive worsening dyspnea for the last 7 days, associated with palpitations, lightheadedness, and headaches. She has been not compliant taking furosemide at home. On her initial physical examination her blood pressure was 138/73, HR 81, RR 16 and 02 saturation 95%, lungs with rales but not wheezing, heart with S1 and S2 present and regular with no gallops, abdomen with no distention, no lower extremity edema. 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   Na 140, K 3,3 Cl 106 bicarbonate 19, glucose 102 bun 46 cr 4,58  BNP 494  Wbc 7.2 hgb 8,2 plt 215  Sars covid 19 negative    Chest radiograph with cardiomegaly, bilateral hilar vascular congestion with bilateral symmetric central interstitial infiltrates, small bilateral pleural effusions.   EKG 75 bpm, normal axis, qtc 502, sinus rhythm with sinus arrhythmia, no significant ST segment or T wave changes.   Patient has been placed on IV furosemide for diuresis.   09/07 patient is feeling better after IV furosemide, but serum cr has been rising. Consulted nephrology.  09/08 resumed IV furosemide. 09/09 increased diuresis, serum cr still not at baseline.  09/10 volume status more stable but continue to rise serum cr. Plan to transition to oral loop and thiazide diuretic therapy, if not good toleration, may need to start renal replacement therapy on this admission.   Assessment and Plan: * Acute on chronic diastolic CHF (congestive heart failure)  (HCC) Echocardiogram with preserved LV systolic function with EF 60 to 65%, severe LVH, RV with preserved systolic function. Small pericardial effusion, posterior to the left ventricle. No significant valvular disease.   Urine output is 1,650 ml. Fluid balance is negative since admission, to -6,136 ml.  Systolic blood pressure 131 to 157 mmHg.   Afterload reduction with hydralazine and isosorbide. Labetalol 300 mg bid. Limited pharmacologic therapy due to reduced GFR.  Transitioned to oral loop diuretic and thiazide with torsemide 100 mg bid and metolazone 5 mg daily.    CKD (chronic kidney disease), stage IV (HCC) AKI, Hypokalemia. Hypomagnesemia   Fluid balance is negative, to -6,136 ml, her symptoms seem to be stable, she continue on room air and has no significant edema.  Renal function today with serum cr at 6,20, K 3,5 and serum bicarbonate at 21.  Na 134, BUN 60, P 6,3   Continue to have elevated serum cr at 5,87 with K at 3,7 and serum bicarbonate at 23.  Na 136. P 6.2  Na 135 and P 6.1  Urinary Na 22  with specific gravity 1,012, protein > 300 with no leukocytes and negative Hgb.   Trial of oral diuretic therapy with torsemide 100 mg bid and metolazone 5 mg daily.  If not good toleration, may need to start renal replacement therapy on this admission.  She has a immature left upper extremity fistula, will need a tunneled HD cathter for hemodialysis.   Metabolic bone disease, continue with phosphate binders.  Continue with calcitriol.  Non anion gap metabolic acidosis, continue with oral sodium  bicarbonate.   Anemia of chronic disease, with iron deficiency. Serum iron 50, TIBC 309, transferrin saturation 16 and ferritin 249  Adding IV iron and EPO.    Essential hypertension, benign Continue blood pressure control with labetalol, hydralazine, isosorbide and amlodipine.   Type 2 diabetes mellitus with hyperlipidemia (HCC) Continue with low dose of basal insulin 70/30:  10 units bid. Continue insulin sliding scale for glucose cover and monitoring.  Fasting glucose today 126 mg/dl.   Continue statin therapy.   H/O: stroke Continue blood pressure control.  Continue with aspirin and statin.   Obstructive sleep apnea Cpap  Class 2 obesity Calculated BMI 36.2         Subjective: Patient with no chest pain, reports improvement in her dyspnea, no edema, or PND  Physical Exam: Vitals:   04/17/23 2348 04/18/23 0409 04/18/23 0623 04/18/23 0743  BP: (!) 116/57 131/65  (!) 157/83  Pulse: 77 75  74  Resp: 17 17  20   Temp: 98 F (36.7 C) 98 F (36.7 C)  98.2 F (36.8 C)  TempSrc: Oral Oral  Oral  SpO2:  94%  95%  Weight:   113.6 kg   Height:       Neurology awake and alert ENT with mild pallor Cardiovascular with S1 and S2 present and regular with no gallops, rubs or murmurs Respiratory with rales at bases with no wheezing or rhonchi Abdomen protuberant with non distention  No lower extremity edema  Data Reviewed:    Family Communication: no family at the bedside   Disposition: Status is: Inpatient Remains inpatient appropriate because: follow up renal function, possible initiation of renal replacement therapy as inpatient.   Planned Discharge Destination: Home    Author: Coralie Keens, MD 04/18/2023 11:03 AM  For on call review [email or web address].

## 2023-04-18 NOTE — Plan of Care (Signed)

## 2023-04-18 NOTE — Progress Notes (Signed)
[facility] Progress Note  Subjective: VSS, BP's good, UOP 1.65 L yesterday. Still with orthopnea (though this sounds chronic) and DOE.  Long discussion with sister on phone today.   Vitals:   04/17/23 2348 04/18/23 0409 04/18/23 0623 04/18/23 0743  BP: (!) 116/57 131/65  (!) 157/83  Pulse: 77 75  74  Resp: 17 17  20   Temp: 98 F (36.7 C) 98 F (36.7 C)  98.2 F (36.8 C)  TempSrc: Oral Oral  Oral  SpO2:  94%  95%  Weight:   113.6 kg   Height:        Exam: Gen alert, no distress, remains sitting straight up bed  looking more comfortable than yesterday Not visibly SOB No jvd or bruits Chest clear BL with normal WOB at rest RRR no MRG Abd soft ntnd no mass or ascites +bs Ext no pretib edema Neuro is alert, Ox 3 , nf, no asterixis      Home meds include - albuterol, norvasc 10, aspirin, rocaltrol 0.25 mcg, lasix 20 bid, insulin 70/30, bidil (20-37.5mg ) 2 tabs tid, labetalol 300 bid, crestor, semaglutide,         Date                       Creat               eGFR      2009- 2016             0.5- 0.8      2020                       1.34- 1.58      2021                       1.36- 2.33              2022                       1.69-  2.13       30- 37 ml/min      Jan- jun 2023         2.92                 19 ml/min      Jul - dec 2023        3.46- 5.20        11- 27ml/min      Mar 2024                3.62                 15      April                        4.00      May                        5.16       June                      5.03                 10       July  4.56                 11                          9/04- 9/07               4.58 >> 5.58    This admission        UA 9/07 - prot >300, 0-5 rbc/ wbc, 11-20 epis, no bact      UNa 22,  UCr 143       Renal US - 10.9/ 11.5 cm kidneys w/o hydronephrosis       Assessment/ Plan: AKI on CKD 5 - b/l creat 4.5- 5.0 from jun- July 2024, EGFR 10- 11 ml/min. Had R arm AVF placed 1 week  ago in prep for suspected ESRD soon. Pt was admitted for SOB/ pulm edema/ DOE and rec'd IV lasix 40- 80mg  bid for the 1st 2 days here. Creat was 4.5 on admission but then worsened and the IV lasix was held. Creat cont'd to increase up to 5.5 > 5.and stable today.  UA was negative except for ^protein. Renal US neg for obstruction. Suspected AKI related to decomp CHF/ volume overload vs progression of CKD 5. Suboptimal diuresis requiring increasing IV Lasix doses to 160 mg tid and adding po metolazone 5 - improved volume status but the issue is will she be able to maintain volume on oral meds - will switch today (torsemide 100 BID + metolazone 5) and follow closely.  No current indications for dialysis but if she cannot maintain euvolemia she'll need to start dialysis this admission.  Sister also brought up she needs to TAKE meds - adherence seems to be an issue.  If she can maintain volume status she has a maturing AVF and would ideally wait to start HD until that is mature.  Acute/ chronic diast CHF - as above, vol overload w/ SOB improving  HTN - lowered Bidil and norvasc dosing, also getting labetalol bid. BP's are stable today.  DM2 H/o CVA Anemia: Hb into 7s, contributing to DOE.  Iron suboptimal - give IV iron today and start ESA as well. Had just started outpt tx - has appt 9/13.  She can cancel (asked she call) and just go for next treatment as scheduled assuming she doesn't start dialysis.  Will follow - call with questions.    Estill Bakes MD [facility] Pager [phone]    Recent Labs  Lab 04/13/23 [phone] 04/14/23 0316 04/17/23 0250 04/18/23 0311  HGB 7.5*  --  7.4*  --   ALBUMIN 3.0*   < > 3.0* 3.2*  CALCIUM 7.8*   < > 8.3* 8.4*  PHOS 5.6*   < > 6.2* 6.3*  CREATININE 4.75*   < > 5.87* 6.20*  K 3.1*   < > 3.7 3.5   < > = values in this interval not displayed.   Recent Labs  Lab 04/17/23 0913  IRON 50  TIBC 309  FERRITIN 249   Inpatient medications:   amLODipine  5 mg Oral Daily   aspirin EC  81 mg Oral Daily   calcitRIOL  0.25 mcg Oral Daily   calcium acetate  667 mg Oral TID WC   heparin  5,000 Units Subcutaneous Q8H   insulin aspart  0-5 Units Subcutaneous QHS   insulin aspart  0-9 Units Subcutaneous TID WC   insulin aspart protamine- aspart  10  Units Subcutaneous BID WC   isosorbide-hydrALAZINE  1 tablet Oral TID   labetalol  300 mg Oral BID   metolazone  5 mg Oral Daily   polyethylene glycol  17 g Oral BID   rosuvastatin  40 mg Oral q AM   sodium bicarbonate  650 mg Oral Daily   sodium chloride flush  3 mL Intravenous Q12H    furosemide Stopped (04/18/23 0728)   acetaminophen **OR** acetaminophen, albuterol

## 2023-04-19 ENCOUNTER — Encounter (HOSPITAL_COMMUNITY): Payer: Self-pay | Admitting: Registered Nurse

## 2023-04-19 ENCOUNTER — Encounter (HOSPITAL_COMMUNITY)
Admission: EM | Disposition: A | Discharge status: Home / Self Care | Payer: Self-pay | Source: Ambulatory Visit | Attending: Internal Medicine

## 2023-04-19 ENCOUNTER — Inpatient Hospital Stay (HOSPITAL_COMMUNITY): Payer: Medicaid Other

## 2023-04-19 DIAGNOSIS — I5033 Acute on chronic diastolic (congestive) heart failure: Secondary | ICD-10-CM | POA: Diagnosis not present

## 2023-04-19 LAB — RENAL FUNCTION PANEL
Albumin: 3.3 g/dL — ABNORMAL LOW (ref 3.5–5.0)
Anion gap: 16 — ABNORMAL HIGH (ref 5–15)
BUN: 70 mg/dL — ABNORMAL HIGH (ref 6–20)
CO2: 25 mmol/L (ref 22–32)
Calcium: 8.6 mg/dL — ABNORMAL LOW (ref 8.9–10.3)
Chloride: 96 mmol/L — ABNORMAL LOW (ref 98–111)
Creatinine, Ser: 6.53 mg/dL — ABNORMAL HIGH (ref 0.44–1.00)
GFR, Estimated: 7 mL/min — ABNORMAL LOW (ref >60)
Glucose, Bld: 131 mg/dL — ABNORMAL HIGH (ref 70–99)
Phosphorus: 6.5 mg/dL — ABNORMAL HIGH (ref 2.5–4.6)
Potassium: 3.5 mmol/L (ref 3.5–5.1)
Sodium: 137 mmol/L (ref 135–145)

## 2023-04-19 LAB — HEPATITIS B SURFACE ANTIGEN: Hepatitis B Surface Ag: NONREACTIVE

## 2023-04-19 LAB — GLUCOSE, CAPILLARY
Glucose-Capillary: 132 mg/dL — ABNORMAL HIGH (ref 70–99)
Glucose-Capillary: 172 mg/dL — ABNORMAL HIGH (ref 70–99)
Glucose-Capillary: 71 mg/dL (ref 70–99)
Glucose-Capillary: 107 mg/dL — ABNORMAL HIGH (ref 70–99)
Glucose-Capillary: 225 mg/dL — ABNORMAL HIGH (ref 70–99)

## 2023-04-19 LAB — MAGNESIUM: Magnesium: 2 mg/dL (ref 1.7–2.4)

## 2023-04-19 SURGERY — INSERTION OF DIALYSIS CATHETER
Anesthesia: General

## 2023-04-19 MED ORDER — CHLORHEXIDINE GLUCONATE 0.12 % MT SOLN
OROMUCOSAL | Status: AC
  Filled 2023-04-19: qty 15

## 2023-04-19 MED ORDER — HEPARIN SODIUM (PORCINE) 1000 UNIT/ML IJ SOLN
INTRAMUSCULAR | Status: AC
  Filled 2023-04-19: qty 10

## 2023-04-19 MED ORDER — LIDOCAINE HCL (PF) 1 % IJ SOLN
INTRAMUSCULAR | Status: AC
  Filled 2023-04-19: qty 30

## 2023-04-19 MED ORDER — HEPARIN 6000 UNIT IRRIGATION SOLUTION
Status: AC
  Filled 2023-04-19: qty 500

## 2023-04-19 MED ORDER — ORAL CARE MOUTH RINSE: 15.0000 mL | Freq: Once | OROMUCOSAL | Status: DC

## 2023-04-19 MED ORDER — SODIUM CHLORIDE 0.9 % IV SOLN: INTRAVENOUS | Status: DC

## 2023-04-19 MED ORDER — ACETAMINOPHEN 500 MG PO TABS: 1000.0000 mg | ORAL_TABLET | Freq: Once | ORAL | Status: DC

## 2023-04-19 MED ORDER — CHLORHEXIDINE GLUCONATE CLOTH 2 % EX PADS
6.0000 | MEDICATED_PAD | Freq: Every day | CUTANEOUS | Status: DC
  Administered 2023-04-20 – 2023-04-22 (×3): 6 via TOPICAL

## 2023-04-19 MED ORDER — PROPOFOL 10 MG/ML IV BOLUS
INTRAVENOUS | Status: AC
  Filled 2023-04-19: qty 20

## 2023-04-19 MED ORDER — CHLORHEXIDINE GLUCONATE 0.12 % MT SOLN: 15.0000 mL | Freq: Once | OROMUCOSAL | Status: DC

## 2023-04-19 NOTE — Progress Notes (Deleted)
   04/19/23 2246  BiPAP/CPAP/SIPAP  BiPAP/CPAP/SIPAP Pt Type Adult  BiPAP/CPAP/SIPAP Resmed  Reason BIPAP/CPAP not in use Other(comment) (Pt home unit, stated she will place on herself later)  FiO2 (%) 21 %  Patient Home Equipment Yes  BiPAP/CPAP /SiPAP Vitals  Pulse Rate 72  Resp 19  SpO2 95 %  Bilateral Breath Sounds Diminished  MEWS Score/Color  MEWS Score 0  MEWS Score Color Green

## 2023-04-19 NOTE — Consult Note (Addendum)
Hospital Consult    Reason for Consult: Acute on chronic kidney disease requiring dialysis Requesting Physician: Nephrology MRN #:  [id number]  History of Present Illness: This is a 52 y.o. female well-known to my service line having undergone right brachiocephalic fistula creation a few weeks ago.  She presented to the hospital with heart failure exacerbation complaining of dyspnea lightheadedness and dizziness.  She has not been compliant with her furosemide at home.  Vascular surgery was consulted for tunneled dialysis catheter placement in an effort to improve her volume status.  On exam, [name] was sitting comfortably.  She denied symptoms of steal.  No complaints at right arm surgery site.  Past Medical History:  Diagnosis Date   Anemia    Anxiety    Arthritis    Asthma    Cataract    Mixed form OU   CKD (chronic kidney disease)    Coronary artery disease    Depression    Diabetes mellitus    Diabetic retinopathy (HCC)    NPDR OU   Dyspnea    Hyperlipidemia    Hypertension    Hypertensive retinopathy    OU   Left thyroid nodule    diagnosed 07/2018   PAC (premature atrial contraction) 02/15/2021   Pseudotumor cerebri    Sleep apnea    Uses a cpap   Stroke (HCC)    Vitamin D deficiency     Past Surgical History:  Procedure Laterality Date   ACHILLES TENDON REPAIR Right    ACHILLES TENDON SURGERY Left 02/19/2021   Procedure: ACHILLES TENDON REPAIR WITH GRAFT;  Surgeon: Candelaria Stagers, DPM;  Location: [facility];  Service: Podiatry;  Laterality: Left;  BLOCK   AMPUTATION TOE Right 05/28/2021   Procedure: AMPUTATION TOE;  Surgeon: Louann Sjogren, MD;  Location: [facility] OR;  Service: Podiatry;  Laterality: Right;  Surgical team will do block   AV FISTULA PLACEMENT Left 07/29/2022   Procedure: LEFT ARTERIOVENOUS (AV) FISTULA CREATION;  Surgeon: Maeola Harman, MD;  Location: [facility] OR;  Service: Vascular;  Laterality: Left;   AV FISTULA PLACEMENT  Right 04/04/2023   Procedure: RIGHT ARM BRACHIOCEPHALIC ARTERIOVENOUS (AV) FISTULA CREATION;  Surgeon: Victorino Sparrow, MD;  Location: [facility] OR;  Service: Vascular;  Laterality: Right;  With regional block   GASTROC RECESSION EXTREMITY Left 02/19/2021   Procedure: GASTROC RECESSION EXTREMITY;  Surgeon: Candelaria Stagers, DPM;  Location: [facility];  Service: Podiatry;  Laterality: Left;  Block   TOE SURGERY Left 01/2023   TUBAL LIGATION      Allergies  Allergen Reactions   Bee Pollen Other (See Comments)    Seasonal Allergies   Dust Mite Extract Cough    Sneezing & Cough   Mixed Ragweed Itching    Prior to Admission medications   Medication Sig Start Date End Date Taking? Authorizing Provider  albuterol (VENTOLIN HFA) 108 (90 Base) MCG/ACT inhaler Inhale 1-2 puffs into the lungs every 6 (six) hours as needed for wheezing or shortness of breath (Asthma).   Yes [provider]  amLODipine (NORVASC) 10 MG tablet Take 1 tablet (10 mg total) by mouth daily. 01/20/23 07/19/23 Yes Patwardhan, Anabel Bene, MD  aspirin 81 MG EC tablet Take 1 tablet (81 mg total) by mouth daily. 10/14/20  Yes Patwardhan, Manish J, MD  calcitRIOL (ROCALTROL) 0.25 MCG capsule Take 0.25 mcg by mouth daily. 10/07/22  Yes [provider]  EPINEPHrine 0.3 mg/0.3 mL IJ SOAJ injection Inject 0.3 mg  into the muscle as needed for anaphylaxis. 12/08/21  Yes Redwine, Madison A, PA-C  furosemide (LASIX) 20 MG tablet Take 20 mg by mouth 2 (two) times daily.   Yes [provider]  insulin aspart protamine - aspart (NOVOLOG 70/30 MIX) (70-30) 100 UNIT/ML FlexPen Inject 30-40 Units into the skin 2 (two) times daily with a meal. Patient taking differently: Inject 30-40 Units into the skin See admin instructions. Inject 40 units subcutaneous in the morning, then inject 30 units subcutaneous in the evening per patient 12/21/22  Yes Nida, Denman George, MD  isosorbide-hydrALAZINE (BIDIL) 20-37.5 MG tablet Take  1 tablet by mouth 3 (three) times daily. Patient taking differently: Take 2 tablets by mouth 3 (three) times daily. 04/23/22  Yes Almon Hercules, MD  labetalol (NORMODYNE) 300 MG tablet Take 300 mg by mouth 2 (two) times daily. 08/16/21  Yes [provider]  rosuvastatin (CRESTOR) 40 MG tablet Take 40 mg by mouth in the morning.   Yes [provider]  Semaglutide,0.25 or 0.5MG /DOS, (OZEMPIC, 0.25 OR 0.5 MG/DOSE,) 2 MG/3ML SOPN Inject 0.25 mg into the skin once a week. 03/28/23  Yes Nida, Denman George, MD  Accu-Chek FastClix Lancets MISC 1 each by Other route 4 (four) times daily. 07/04/22   Roma Kayser, MD  ACCU-CHEK GUIDE test strip 1 each by Other route 4 (four) times daily. for testing 07/04/22   Roma Kayser, MD  blood glucose meter kit and supplies KIT Dispense based on patient and insurance preference. Use up to four times daily as directed. (FOR ICD-9 250.00, 250.01). 04/12/18   Glenford Bayley, NP  Blood Glucose Monitoring Suppl (ACCU-CHEK GUIDE ME) w/Device KIT Use to check blood glucose four times daily 07/04/22   Roma Kayser, MD  Continuous Blood Gluc Receiver (DEXCOM G7 RECEIVER) DEVI Use to monitor BG continuously 04/13/22   Roma Kayser, MD  Continuous Glucose Sensor (DEXCOM G7 SENSOR) MISC Change sensor every 10 days 12/21/22   Roma Kayser, MD  Insulin Pen Needle 32G X 4 MM MISC Use to inject insulin twice daily 01/23/23   Roma Kayser, MD  Insulin Syringes, Disposable, U-100 1 ML MISC 1 application by Does not apply route 2 (two) times a day. 02/20/19   Love, Evlyn Kanner, PA-C    Social History   Socioeconomic History   Marital status: Single    Spouse name: Not on file   Number of children: 4   Years of education: Not on file   Highest education level: Not on file  Occupational History   Occupation: unemployed  Tobacco Use   Smoking status: Former    Current packs/day: 0.00    Average packs/day: 0.5  packs/day for 30.0 years (15.0 ttl pk-yrs)    Types: Cigarettes    Start date: 61    Quit date: 2017    Years since quitting: 7.6    Passive exposure: Past   Smokeless tobacco: Never  Vaping Use   Vaping status: Never Used  Substance and Sexual Activity   Alcohol use: Not Currently    Alcohol/week: 0.0 standard drinks of alcohol    Comment: rare   Drug use: Not Currently    Types: Marijuana    Comment: occasional   Sexual activity: Not Currently    Partners: Male    Birth control/protection: Surgical  Other Topics Concern   Not on file  Social History Narrative   Lives with 3 kids   Right handed  Drinks 2 cups caffeine daily   Social Determinants of Health   Financial Resource Strain: Not on file  Food Insecurity: No Food Insecurity (04/13/2023)   Hunger Vital Sign    Worried About Running Out of Food in the Last Year: Never true    Ran Out of Food in the Last Year: Never true  Transportation Needs: No Transportation Needs (04/13/2023)   PRAPARE - Administrator, Civil Service (Medical): No    Lack of Transportation (Non-Medical): No  Physical Activity: Inactive (02/15/2021)   Exercise Vital Sign    Days of Exercise per Week: 0 days    Minutes of Exercise per Session: 0 min  Stress: Stress Concern Present (02/15/2021)   [employer] of Occupational Health - Occupational Stress Questionnaire    Feeling of Stress : Very much  Social Connections: Moderately Isolated (02/15/2021)   Social Connection and Isolation Panel [NHANES]    Frequency of Communication with Friends and Family: Three times a week    Frequency of Social Gatherings with Friends and Family: Three times a week    Attends Religious Services: More than 4 times per year    Active Member of Clubs or Organizations: No    Attends Banker Meetings: Never    Marital Status: Never married  Intimate Partner Violence: Not At Risk (04/13/2023)   Humiliation, Afraid, Rape, and Kick  questionnaire    Fear of Current or Ex-Partner: No    Emotionally Abused: No    Physically Abused: No    Sexually Abused: No   Family History  Problem Relation Age of Onset   Asthma Mother    Diabetes Father    Hypertension Father    Kidney disease Father    Heart attack Father    Heart failure Father    Hypertension Sister    Asthma Sister    Congestive Heart Failure Maternal Aunt    Diabetes Maternal Grandmother    Congestive Heart Failure Maternal Grandmother    Lung disease Maternal Grandmother    Asthma Other    Hyperlipidemia Other    Hypertension Other    Cancer Other     ROS: Otherwise negative unless mentioned in HPI  Physical Examination  Vitals:   04/19/23 0407 04/19/23 0812  BP: (!) 141/65 (!) 143/65  Pulse: 72 73  Resp: 18 18  Temp: 98.6 F (37 C) 97.9 F (36.6 C)  SpO2: 92% 94%   Body mass index is 36.5 kg/m.  General:  WDWN in NAD Gait: Not observed HENT: WNL, normocephalic Pulmonary: normal non-labored breathing, without Rales, rhonchi,  wheezing Cardiac: regula Abdomen: soft, NT/ND, no masses Skin: without rashes Vascular Exam/Pulses: 1+ radial Extremities: without ischemic changes, without Gangrene , without cellulitis; without open wounds;  Musculoskeletal: no muscle wasting or atrophy  Neurologic: A&O X 3;  No focal weakness or paresthesias are detected; speech is fluent/normal Psychiatric:  The pt has Normal affect. Lymph:  Unremarkable  CBC    Component Value Date/Time   WBC 7.0 04/17/2023 0250   RBC 2.50 (L) 04/17/2023 0250   HGB 7.4 (L) 04/17/2023 0250   HGB 8.4 (L) 03/30/2020 0900   HCT 22.9 (L) 04/17/2023 0250   HCT 26.5 (L) 03/30/2020 0900   PLT 222 04/17/2023 0250   PLT 300 03/30/2020 0900   MCV 91.6 04/17/2023 0250   MCV 87 03/30/2020 0900   MCH 29.6 04/17/2023 0250   MCHC 32.3 04/17/2023 0250   RDW 15.1 04/17/2023 0250  RDW 14.3 03/30/2020 0900   LYMPHSABS 1.1 04/20/2022 1407   MONOABS 0.4 04/20/2022 1407    EOSABS 0.2 04/20/2022 1407   BASOSABS 0.0 04/20/2022 1407    BMET    Component Value Date/Time   NA 137 04/19/2023 0401   NA 132 (L) 06/29/2022 0917   K 3.5 04/19/2023 0401   CL 96 (L) 04/19/2023 0401   CO2 25 04/19/2023 0401   GLUCOSE 131 (H) 04/19/2023 0401   BUN 70 (H) 04/19/2023 0401   BUN 46 (H) 06/29/2022 0917   CREATININE 6.53 (H) 04/19/2023 0401   CALCIUM 8.6 (L) 04/19/2023 0401   CALCIUM 7.2 (L) 03/03/2023 0840   GFRNONAA 7 (L) 04/19/2023 0401   GFRAA 51 (L) 03/30/2020 1059    COAGS: Lab Results  Component Value Date   INR 1.0 02/11/2019       ASSESSMENT/PLAN: This is a 52 y.o. female who is volume overloaded after heart failure exacerbation.  She has a progression in her kidney disease now requiring tunneled HD catheter for volume management.  After discussing risk and benefits of tunneled dialysis catheter placement, [name] elected to proceed.  Plan will be tunneled dialysis catheter placement in the operating room tomorrow.  Please make n.p.o. midnight   Victorino Sparrow MD [facility] [phone] 04/19/2023  10:32 AM   _________________________________________   Plan for [facility] today - Pt okay proceeding.

## 2023-04-19 NOTE — Progress Notes (Addendum)
Requested to see pt for out-pt HD needs at d/c. Pt currently unavailable. Will attempt to see pt later today if possible.   Olivia Canter Renal Navigator [phone]  Addendum at 4:12 pm: Attempted to speak with pt via phone but unable to reach pt. Will attempt to see pt again tomorrow.

## 2023-04-19 NOTE — Progress Notes (Signed)
1350 Pt is in pre-op, last ate a full breakfast at 0730.  Dr Karin Lieu will reschedule the procedure for tom.  Shamika RN at [facility] made aware that pt is going back.

## 2023-04-19 NOTE — Progress Notes (Signed)
   04/19/23 2246  BiPAP/CPAP/SIPAP  BiPAP/CPAP/SIPAP Pt Type Adult  BiPAP/CPAP/SIPAP Resmed  Reason BIPAP/CPAP not in use Other(comment) (Pt stated she will place on herself later Rt will check back)  FiO2 (%) 21 %  Auto Titrate No  BiPAP/CPAP /SiPAP Vitals  Pulse Rate 72  Resp 19  SpO2 95 %  Bilateral Breath Sounds Diminished  MEWS Score/Color  MEWS Score 0  MEWS Score Color Green

## 2023-04-19 NOTE — Plan of Care (Signed)

## 2023-04-19 NOTE — Progress Notes (Signed)
[name] Progress Note  Subjective: VSS, BP's good, UOP 1.85 L yesterday. Still with orthopnea (though this sounds chronic) and DOE.  Labs worse.    Vitals:   04/18/23 2346 04/19/23 0407 04/19/23 0500 04/19/23 0812  BP: 135/64 (!) 141/65  (!) 143/65  Pulse: 75 72  73  Resp: 17 18  18   Temp: 98.1 F (36.7 C) 98.6 F (37 C)  97.9 F (36.6 C)  TempSrc: Oral Oral  Oral  SpO2: 92% 92%  94%  Weight:   112.1 kg   Height:        Exam: Gen alert, no distress  Not visibly SOB No jvd or bruits Chest clear BL with normal WOB at rest RRR no MRG Abd soft ntnd no mass or ascites +bs Ext no pretib edema, RUE AVF +t/b. Neuro is alert, Ox 3 , nf, no asterixis      Home meds include - albuterol, norvasc 10, aspirin, rocaltrol 0.25 mcg, lasix 20 bid, insulin 70/30, bidil (20-37.5mg ) 2 tabs tid, labetalol 300 bid, crestor, semaglutide,         Date                       Creat               eGFR      2009- 2016             0.5- 0.8      2020                       1.34- 1.58      2021                       1.36- 2.33              2022                       1.69-  2.13       30- 37 ml/min      Jan- jun 2023         2.92                 19 ml/min      Jul - dec 2023        3.46- 5.20        11- 22ml/min      Mar 2024                3.62                 15      April                        4.00      May                        5.16       June                      5.03                 10       July                        4.56  11                          9/04- 9/07               4.58 >> 5.58    This admission        UA 9/07 - prot >300, 0-5 rbc/ wbc, 11-20 epis, no bact      UNa 22,  UCr 143       Renal US - 10.9/ 11.5 cm kidneys w/o hydronephrosis       Assessment/ Plan: AKI on CKD 5 - b/l creat 4.5- 5.0 from jun- July 2024, EGFR 10- 11 ml/min. Had R arm AVF placed 1 week ago in prep for suspected ESRD soon. Pt was admitted for SOB/ pulm edema/ DOE.  She's  diuresed some but required high dose diuretics and still isn't feeling great.  I think early uremic symptoms are playing a role and I have recommended initiation of dialysis for which she's agreeable.  HD catheter per [facility] - appreciated.  Likely tomorrow.   Will CLIP to outpt HD.  Acute/ chronic diast CHF - as above, vol overload w/ SOB improving  HTN - lowered Bidil and norvasc dosing, also getting labetalol bid. BP's are stable today.  DM2 H/o CVA Anemia: Hb into 7s, with iron deficiency.  IV iron and ESA.   Will follow - call with questions.    Teresa Bakes MD [facility] Pager [phone]    Recent Labs  Lab 04/13/23 [phone] 04/14/23 0316 04/17/23 0250 04/18/23 0311 04/19/23 0401  HGB 7.5*  --  7.4*  --   --   ALBUMIN 3.0*   < > 3.0* 3.2* 3.3*  CALCIUM 7.8*   < > 8.3* 8.4* 8.6*  PHOS 5.6*   < > 6.2* 6.3* 6.5*  CREATININE 4.75*   < > 5.87* 6.20* 6.53*  K 3.1*   < > 3.7 3.5 3.5   < > = values in this interval not displayed.   Recent Labs  Lab 04/17/23 0913  IRON 50  TIBC 309  FERRITIN 249   Inpatient medications:  amLODipine  5 mg Oral Daily   aspirin EC  81 mg Oral Daily   calcitRIOL  0.25 mcg Oral Daily   calcium acetate  667 mg Oral TID WC   darbepoetin (ARANESP) injection - NON-DIALYSIS  100 mcg Subcutaneous Q Tue-1800   heparin  5,000 Units Subcutaneous Q8H   insulin aspart  0-5 Units Subcutaneous QHS   insulin aspart  0-9 Units Subcutaneous TID WC   insulin aspart protamine- aspart  10 Units Subcutaneous BID WC   isosorbide-hydrALAZINE  1 tablet Oral TID   labetalol  300 mg Oral BID   metolazone  5 mg Oral Daily   polyethylene glycol  17 g Oral BID   rosuvastatin  40 mg Oral q AM   sodium bicarbonate  650 mg Oral Daily   sodium chloride flush  3 mL Intravenous Q12H   torsemide  100 mg Oral BID    ferric gluconate (FERRLECIT) IVPB Stopped (04/18/23 1251)   acetaminophen **OR** acetaminophen, albuterol

## 2023-04-19 NOTE — Anesthesia Preprocedure Evaluation (Signed)
Anesthesia Evaluation  Patient identified by MRN, date of birth, ID band Patient awake    Reviewed: Allergy & Precautions, NPO status , [name] Chart, lab work & pertinent test results  Airway Mallampati: II  TM Distance: >3 FB Neck ROM: Full    Dental  (+) Dental Advisory Given   Pulmonary asthma , sleep apnea , former smoker   breath sounds clear to auscultation       Cardiovascular hypertension, Pt. on medications + CAD and +CHF   Rhythm:Regular Rate:Normal     Neuro/Psych CVA    GI/Hepatic negative GI ROS, Neg liver ROS,,,  Endo/Other  diabetes, Type 2    Renal/GU CRF and ESRFRenal disease     Musculoskeletal  (+) Arthritis ,    Abdominal   Peds  Hematology  (+) Blood dyscrasia, anemia   Anesthesia Other Findings   Reproductive/Obstetrics                             Anesthesia Physical Anesthesia Plan  ASA: 4  Anesthesia Plan: General   Post-op Pain Management: Tylenol PO (pre-op)*   Induction: Intravenous  PONV Risk Score and Plan: 3 and Dexamethasone, Ondansetron and Treatment may vary due to age or medical condition  Airway Management Planned: LMA  Additional Equipment:   Intra-op Plan:   Post-operative Plan: Extubation in OR  Informed Consent: I have reviewed the patients History and Physical, chart, labs and discussed the procedure including the risks, benefits and alternatives for the proposed anesthesia with the patient or authorized representative who has indicated his/her understanding and acceptance.     Dental advisory given  Plan Discussed with: CRNA  Anesthesia Plan Comments:        Anesthesia Quick Evaluation

## 2023-04-20 ENCOUNTER — Encounter (HOSPITAL_COMMUNITY): Payer: Self-pay | Admitting: Internal Medicine

## 2023-04-20 ENCOUNTER — Inpatient Hospital Stay (HOSPITAL_COMMUNITY): Payer: Medicaid Other | Admitting: Registered Nurse

## 2023-04-20 ENCOUNTER — Other Ambulatory Visit: Payer: Self-pay

## 2023-04-20 ENCOUNTER — Encounter (HOSPITAL_COMMUNITY)
Admission: EM | Disposition: A | Discharge status: Home / Self Care | Payer: Self-pay | Source: Ambulatory Visit | Attending: Internal Medicine

## 2023-04-20 ENCOUNTER — Inpatient Hospital Stay (HOSPITAL_COMMUNITY): Payer: Medicaid Other

## 2023-04-20 DIAGNOSIS — I5033 Acute on chronic diastolic (congestive) heart failure: Secondary | ICD-10-CM | POA: Diagnosis not present

## 2023-04-20 DIAGNOSIS — N185 Chronic kidney disease, stage 5: Secondary | ICD-10-CM | POA: Diagnosis not present

## 2023-04-20 DIAGNOSIS — N186 End stage renal disease: Secondary | ICD-10-CM

## 2023-04-20 DIAGNOSIS — Z992 Dependence on renal dialysis: Secondary | ICD-10-CM

## 2023-04-20 DIAGNOSIS — I509 Heart failure, unspecified: Secondary | ICD-10-CM

## 2023-04-20 DIAGNOSIS — I132 Hypertensive heart and chronic kidney disease with heart failure and with stage 5 chronic kidney disease, or end stage renal disease: Secondary | ICD-10-CM

## 2023-04-20 HISTORY — PX: INSERTION OF DIALYSIS CATHETER: SHX1324

## 2023-04-20 LAB — BASIC METABOLIC PANEL
Anion gap: 14 (ref 5–15)
BUN: 80 mg/dL — ABNORMAL HIGH (ref 6–20)
CO2: 24 mmol/L (ref 22–32)
Calcium: 8.1 mg/dL — ABNORMAL LOW (ref 8.9–10.3)
Chloride: 96 mmol/L — ABNORMAL LOW (ref 98–111)
Creatinine, Ser: 6.96 mg/dL — ABNORMAL HIGH (ref 0.44–1.00)
GFR, Estimated: 7 mL/min — ABNORMAL LOW (ref >60)
Glucose, Bld: 143 mg/dL — ABNORMAL HIGH (ref 70–99)
Potassium: 3.2 mmol/L — ABNORMAL LOW (ref 3.5–5.1)
Sodium: 134 mmol/L — ABNORMAL LOW (ref 135–145)

## 2023-04-20 LAB — POCT I-STAT, CHEM 8
BUN: 81 mg/dL — ABNORMAL HIGH (ref 6–20)
Calcium, Ion: 0.86 mmol/L — CL (ref 1.15–1.40)
Chloride: 99 mmol/L (ref 98–111)
Creatinine, Ser: 7.7 mg/dL — ABNORMAL HIGH (ref 0.44–1.00)
Glucose, Bld: 143 mg/dL — ABNORMAL HIGH (ref 70–99)
HCT: 27 % — ABNORMAL LOW (ref 36.0–46.0)
Hemoglobin: 9.2 g/dL — ABNORMAL LOW (ref 12.0–15.0)
Potassium: 3.7 mmol/L (ref 3.5–5.1)
Sodium: 134 mmol/L — ABNORMAL LOW (ref 135–145)
TCO2: 23 mmol/L (ref 22–32)

## 2023-04-20 LAB — GLUCOSE, CAPILLARY
Glucose-Capillary: 136 mg/dL — ABNORMAL HIGH (ref 70–99)
Glucose-Capillary: 141 mg/dL — ABNORMAL HIGH (ref 70–99)
Glucose-Capillary: 138 mg/dL — ABNORMAL HIGH (ref 70–99)
Glucose-Capillary: 132 mg/dL — ABNORMAL HIGH (ref 70–99)
Glucose-Capillary: 112 mg/dL — ABNORMAL HIGH (ref 70–99)
Glucose-Capillary: 166 mg/dL — ABNORMAL HIGH (ref 70–99)
Glucose-Capillary: 135 mg/dL — ABNORMAL HIGH (ref 70–99)

## 2023-04-20 LAB — CBC
HCT: 23.6 % — ABNORMAL LOW (ref 36.0–46.0)
Hemoglobin: 7.8 g/dL — ABNORMAL LOW (ref 12.0–15.0)
MCH: 30.8 pg (ref 26.0–34.0)
MCHC: 33.1 g/dL (ref 30.0–36.0)
MCV: 93.3 fL (ref 80.0–100.0)
Platelets: 274 10*3/uL (ref 150–400)
RBC: 2.53 MIL/uL — ABNORMAL LOW (ref 3.87–5.11)
RDW: 14.9 % (ref 11.5–15.5)
WBC: 7.6 10*3/uL (ref 4.0–10.5)
nRBC: 0 % (ref 0.0–0.2)

## 2023-04-20 LAB — HEPATITIS B SURFACE ANTIBODY, QUANTITATIVE: Hep B S AB Quant (Post): <3.5 m[IU]/mL — ABNORMAL LOW

## 2023-04-20 SURGERY — INSERTION OF DIALYSIS CATHETER
Anesthesia: General | Site: Chest | Laterality: Left

## 2023-04-20 MED ORDER — ORAL CARE MOUTH RINSE: 15.0000 mL | Freq: Once | OROMUCOSAL | Status: AC

## 2023-04-20 MED ORDER — HEPARIN SODIUM (PORCINE) 1000 UNIT/ML DIALYSIS
1000.0000 [IU] | INTRAMUSCULAR | Status: DC | PRN
  Filled 2023-04-20: qty 1

## 2023-04-20 MED ORDER — CEFAZOLIN SODIUM 1 G IJ SOLR
INTRAMUSCULAR | Status: AC
  Filled 2023-04-20: qty 20

## 2023-04-20 MED ORDER — FENTANYL CITRATE (PF) 250 MCG/5ML IJ SOLN
INTRAMUSCULAR | Status: AC
  Filled 2023-04-20: qty 5

## 2023-04-20 MED ORDER — HEPARIN 6000 UNIT IRRIGATION SOLUTION
Status: DC | PRN
  Administered 2023-04-20: 1

## 2023-04-20 MED ORDER — ANTICOAGULANT SODIUM CITRATE 4% (200MG/5ML) IV SOLN: 5.0000 mL | Status: DC | PRN

## 2023-04-20 MED ORDER — ALTEPLASE 2 MG IJ SOLR: 2.0000 mg | Freq: Once | INTRAMUSCULAR | Status: DC | PRN

## 2023-04-20 MED ORDER — PENTAFLUOROPROP-TETRAFLUOROETH EX AERO: 1.0000 | INHALATION_SPRAY | CUTANEOUS | Status: DC | PRN

## 2023-04-20 MED ORDER — ONDANSETRON HCL 4 MG/2ML IJ SOLN
INTRAMUSCULAR | Status: AC
  Filled 2023-04-20: qty 2

## 2023-04-20 MED ORDER — ONDANSETRON HCL 4 MG/2ML IJ SOLN
INTRAMUSCULAR | Status: DC | PRN
  Administered 2023-04-20: 4 mg via INTRAVENOUS

## 2023-04-20 MED ORDER — FENTANYL CITRATE (PF) 250 MCG/5ML IJ SOLN
INTRAMUSCULAR | Status: DC | PRN
  Administered 2023-04-20: 50 ug via INTRAVENOUS

## 2023-04-20 MED ORDER — SODIUM CHLORIDE 0.9 % IV SOLN: INTRAVENOUS | Status: DC

## 2023-04-20 MED ORDER — CEFAZOLIN SODIUM-DEXTROSE 2-3 GM-%(50ML) IV SOLR
INTRAVENOUS | Status: DC | PRN
Start: 2023-04-20 — End: 2023-04-20
  Administered 2023-04-20: 2 g via INTRAVENOUS

## 2023-04-20 MED ORDER — CHLORHEXIDINE GLUCONATE 0.12 % MT SOLN
OROMUCOSAL | Status: AC
  Administered 2023-04-20: 15 mL via OROMUCOSAL
  Filled 2023-04-20: qty 15

## 2023-04-20 MED ORDER — LIDOCAINE 2% (20 MG/ML) 5 ML SYRINGE
INTRAMUSCULAR | Status: AC
  Filled 2023-04-20: qty 5

## 2023-04-20 MED ORDER — PHENYLEPHRINE 80 MCG/ML (10ML) SYRINGE FOR IV PUSH (FOR BLOOD PRESSURE SUPPORT)
PREFILLED_SYRINGE | INTRAVENOUS | Status: AC
  Filled 2023-04-20: qty 10

## 2023-04-20 MED ORDER — CHLORHEXIDINE GLUCONATE 0.12 % MT SOLN: 15.0000 mL | Freq: Once | OROMUCOSAL | Status: AC

## 2023-04-20 MED ORDER — MIDAZOLAM HCL 2 MG/2ML IJ SOLN
INTRAMUSCULAR | Status: AC
  Filled 2023-04-20: qty 2

## 2023-04-20 MED ORDER — PHENYLEPHRINE 80 MCG/ML (10ML) SYRINGE FOR IV PUSH (FOR BLOOD PRESSURE SUPPORT)
PREFILLED_SYRINGE | INTRAVENOUS | Status: DC | PRN
  Administered 2023-04-20: 80 ug via INTRAVENOUS
  Administered 2023-04-20 (×2): 160 ug via INTRAVENOUS

## 2023-04-20 MED ORDER — LIDOCAINE HCL (PF) 1 % IJ SOLN: 5.0000 mL | INTRAMUSCULAR | Status: DC | PRN

## 2023-04-20 MED ORDER — LIDOCAINE 2% (20 MG/ML) 5 ML SYRINGE
INTRAMUSCULAR | Status: DC | PRN
  Administered 2023-04-20: 60 mg via INTRAVENOUS

## 2023-04-20 MED ORDER — 0.9 % SODIUM CHLORIDE (POUR BTL) OPTIME
TOPICAL | Status: DC | PRN
  Administered 2023-04-20: 1000 mL

## 2023-04-20 MED ORDER — PROPOFOL 10 MG/ML IV BOLUS
INTRAVENOUS | Status: DC | PRN
  Administered 2023-04-20: 50 mg via INTRAVENOUS
  Administered 2023-04-20: 150 mg via INTRAVENOUS

## 2023-04-20 MED ORDER — MIDAZOLAM HCL 2 MG/2ML IJ SOLN
INTRAMUSCULAR | Status: DC | PRN
  Administered 2023-04-20: 2 mg via INTRAVENOUS

## 2023-04-20 MED ORDER — HEPARIN SODIUM (PORCINE) 1000 UNIT/ML IJ SOLN
INTRAMUSCULAR | Status: DC | PRN
  Administered 2023-04-20: 2800 [IU]

## 2023-04-20 MED ORDER — FENTANYL CITRATE (PF) 100 MCG/2ML IJ SOLN: 25.0000 ug | INTRAMUSCULAR | Status: DC | PRN

## 2023-04-20 MED ORDER — POTASSIUM CHLORIDE CRYS ER 20 MEQ PO TBCR
20.0000 meq | EXTENDED_RELEASE_TABLET | Freq: Two times a day (BID) | ORAL | Status: AC
  Administered 2023-04-20: 20 meq via ORAL
  Filled 2023-04-20 (×2): qty 1

## 2023-04-20 MED ORDER — LIDOCAINE-PRILOCAINE 2.5-2.5 % EX CREA: 1.0000 | TOPICAL_CREAM | CUTANEOUS | Status: DC | PRN

## 2023-04-20 SURGICAL SUPPLY — 35 items
ADH SKN CLS APL DERMABOND .7 (GAUZE/BANDAGES/DRESSINGS) ×1
BAG COUNTER SPONGE SURGICOUNT (BAG) ×2 IMPLANT
BAG DECANTER FOR FLEXI CONT (MISCELLANEOUS) ×2 IMPLANT
BAG SPNG CNTER NS LX DISP (BAG) ×1
BIOPATCH RED 1 DISK 7.0 (GAUZE/BANDAGES/DRESSINGS) ×2 IMPLANT
CATH PALINDROME-P 23CM W/VT (CATHETERS) IMPLANT
COVER PROBE W GEL 5X96 (DRAPES) ×2 IMPLANT
COVER SURGICAL LIGHT HANDLE (MISCELLANEOUS) ×2 IMPLANT
DERMABOND ADVANCED .7 DNX12 (GAUZE/BANDAGES/DRESSINGS) ×2 IMPLANT
DRAPE CHEST BREAST 15X10 FENES (DRAPES) ×2 IMPLANT
GAUZE 4X4 16PLY ~~LOC~~+RFID DBL (SPONGE) ×2 IMPLANT
GLOVE BIOGEL PI IND STRL 8 (GLOVE) ×2 IMPLANT
GOWN STRL REUS W/ TWL LRG LVL3 (GOWN DISPOSABLE) ×4 IMPLANT
GOWN STRL REUS W/TWL 2XL LVL3 (GOWN DISPOSABLE) ×4 IMPLANT
GOWN STRL REUS W/TWL LRG LVL3 (GOWN DISPOSABLE) ×2
KIT BASIN OR (CUSTOM PROCEDURE TRAY) ×2 IMPLANT
KIT TURNOVER KIT B (KITS) ×2 IMPLANT
NDL 18GX1X1/2 (RX/OR ONLY) (NEEDLE) ×2 IMPLANT
NEEDLE 18GX1X1/2 (RX/OR ONLY) (NEEDLE) ×1 IMPLANT
NS IRRIG 1000ML POUR BTL (IV SOLUTION) ×2 IMPLANT
PACK BASIC III (CUSTOM PROCEDURE TRAY) ×1
PACK SRG BSC III STRL LF ECLPS (CUSTOM PROCEDURE TRAY) ×2 IMPLANT
PAD ARMBOARD 7.5X6 YLW CONV (MISCELLANEOUS) ×4 IMPLANT
SET MICROPUNCTURE 5F STIFF (MISCELLANEOUS) IMPLANT
SOAP 2 % CHG 4 OZ (WOUND CARE) ×2 IMPLANT
SUT ETHILON 3 0 PS 1 (SUTURE) ×2 IMPLANT
SUT MNCRL AB 4-0 PS2 18 (SUTURE) ×2 IMPLANT
SYR 10ML LL (SYRINGE) ×2 IMPLANT
SYR 20ML LL LF (SYRINGE) ×4 IMPLANT
SYR 5ML LL (SYRINGE) ×2 IMPLANT
SYR CONTROL 10ML LL (SYRINGE) ×2 IMPLANT
TOWEL GREEN STERILE (TOWEL DISPOSABLE) ×2 IMPLANT
TOWEL GREEN STERILE FF (TOWEL DISPOSABLE) ×2 IMPLANT
WATER STERILE IRR 1000ML POUR (IV SOLUTION) ×2 IMPLANT
WIRE AMPLATZ SS-J .035X180CM (WIRE) IMPLANT

## 2023-04-20 NOTE — Plan of Care (Signed)
Nutrition Education Note  RD consulted for Renal Education. Provided renal diet education handouts at bedside patient just returned from HD cath placement and very sleepy. RD follow up as able.  Body mass index is 36.43 kg/m. Pt meets criteria for obesity based on current BMI.  Current diet order is Heart healthy carb modified, patient is consuming approximately 100% of meals at this time. Labs and medications reviewed. No further nutrition interventions warranted at this time. RD contact information provided. If additional nutrition issues arise, please re-consult RD.  Ricardo Jericho, RDN. LDN

## 2023-04-20 NOTE — Anesthesia Postprocedure Evaluation (Signed)
Anesthesia Post Note  Patient: [name]  Procedure(s) Performed: INSERTION OFTUNNELED  DIALYSIS CATHETER (Left: Chest)     Patient location during evaluation: PACU Anesthesia Type: General Level of consciousness: awake Pain management: pain level controlled Vital Signs Assessment: post-procedure vital signs reviewed and stable Respiratory status: spontaneous breathing, nonlabored ventilation and respiratory function stable Cardiovascular status: blood pressure returned to baseline and stable Postop Assessment: no apparent nausea or vomiting Anesthetic complications: no   No notable events documented.  Last Vitals:  Vitals:   04/20/23 1431 04/20/23 1445  BP: (!) 150/78 (!) 156/79  Pulse: 69 70  Resp:    Temp:    SpO2:  92%    Last Pain:  Vitals:   04/20/23 1430  TempSrc: Oral  PainSc:                  Railey Glad P Rena Sweeden

## 2023-04-20 NOTE — Progress Notes (Signed)
Met with pt at bedside this morning. Introduced self and explained role. Pt lives in [place] and we discussed out-pt HD options near pt's home. Pt prefers [facility] if possible. Referral submitted to [facility] admissions this morning for review. Pt states that she has someone that can assist her with transportation to/from HD appts at d/c. Will assist as needed.   Olivia Canter Renal Navigator [phone]

## 2023-04-20 NOTE — Plan of Care (Signed)

## 2023-04-20 NOTE — Anesthesia Procedure Notes (Signed)
Procedure Name: LMA Insertion Date/Time: 04/20/2023 12:52 PM  Performed by: Sharyn Dross, CRNAPre-anesthesia Checklist: Patient identified, Emergency Drugs available, Suction available and Patient being monitored Patient Re-evaluated:Patient Re-evaluated prior to induction Oxygen Delivery Method: Circle system utilized Preoxygenation: Pre-oxygenation with 100% oxygen Induction Type: IV induction Ventilation: Mask ventilation without difficulty LMA: LMA inserted LMA Size: 4.0 Number of attempts: 1 Placement Confirmation: positive ETCO2 and breath sounds checked- equal and bilateral Tube secured with: Tape Dental Injury: Teeth and Oropharynx as per pre-operative assessment

## 2023-04-20 NOTE — Progress Notes (Signed)
  Daily Progress Note  Subjective: No complaints  Objective: Vitals:   04/20/23 0758 04/20/23 1057  BP: (!) 154/77 (!) 158/83  Pulse: 76 74  Resp: 20 20  Temp: 98.4 F (36.9 C) 98.1 F (36.7 C)  SpO2: 94% 96%    Physical Examination Nonlabored breathing No abdominal pain  No distress    ASSESSMENT/PLAN:  Pt with ESRD in need of access.  I have discussed the risks and benefits of tunneled dialysis catheter placement while her right arm fistula matures, [name] elected to proceed.   Victorino Sparrow MD [facility] [phone] 04/20/2023  12:11 PM

## 2023-04-20 NOTE — Op Note (Signed)
    NAME: [name]    MRN: [id number] DOB: Jun 30, 1971    DATE OF OPERATION: 04/20/2023  PREOP DIAGNOSIS:    End Stage Renal Disease  POSTOP DIAGNOSIS:    Same  PROCEDURE:    Left internal jugular vein Tunneled dialysis catheter 23cm palindrome  SURGEON: Victorino Sparrow  ASSIST: None  ANESTHESIA: General   EBL: 50ml  INDICATIONS:    [name] is a 52 y.o. female with end stage renal disease in need of dialysis.  After discussing the risks and benefits of tunneled dialysis cathter placement, [name] elected to proceed.   FINDINGS:    23cm palindrome catheter placement  TECHNIQUE:   Using ultrasound guidance the left internal jugular vein was accessed with micropuncture technique.  Through the micropuncture sheath a floppy J-wire was advanced into the superior vena cava.  A small incision was made around the skin access point.  A counterincision was made in the chest under the clavicle.  A 23 cm tunneled dialysis catheter was then tunneled under the skin, over the clavicle into the incision in the neck.  The access point was serially dilated under direct fluoroscopic guidance.  A peel-away sheath was introduced into the superior vena cava under fluoroscopic guidance.  The tunneling device was removed and the catheter fed through the peel-away sheath into the superior vena cava.  The peel-away sheath was removed and the catheter gently pulled back.  Adequate position was confirmed with x-ray.  The catheter was tested and found to flush and draw back well.  Catheter was heparin locked.  Caps were applied.  Catheter was sutured to the skin.  The neck incision was closed with 4-0 Monocryl.   Victorino Sparrow, MD [facility] DATE OF DICTATION:   04/20/2023

## 2023-04-20 NOTE — Transfer of Care (Signed)
Immediate Anesthesia Transfer of Care Note  Patient: [name]  Procedure(s) Performed: INSERTION OFTUNNELED  DIALYSIS CATHETER (Left: Chest)  Patient Location: PACU  Anesthesia Type:General  Level of Consciousness: awake, alert , and oriented  Airway & Oxygen Therapy: Patient Spontanous Breathing  Post-op Assessment: Report given to RN, Post -op Vital signs reviewed and stable, and Patient moving all extremities X 4  Post vital signs: Reviewed and stable  Last Vitals:  Vitals Value Taken Time  BP 129/70 04/20/23 1342  Temp    Pulse 66 04/20/23 1345  Resp 14 04/20/23 1345  SpO2 90 % 04/20/23 1345  Vitals shown include unfiled device data.  Last Pain:  Vitals:   04/20/23 1057  TempSrc: Oral  PainSc:          Complications: No notable events documented.

## 2023-04-20 NOTE — Progress Notes (Signed)
PROGRESS NOTE    [name]  NWG:[id number] DOB: 20-Jul-1971 DOA: 04/12/2023 PCP: Jackie Plum, MD  52 yo female with the past medical history of hypertension, dyslipidemia, heart failure, CKD stage IV, and obesity class 2. Recent AV fistula creation for HD on 04/04/23 presented w/progressive worsening dyspnea, palpitations, lightheadedness, and headaches. In ED, bun 46 cr 4,58, BNP 494, hgb 8,2 CXR w/cardiomegaly, bilateral hilar vascular congestion with bilateral symmetric central interstitial infiltrates, small bilateral pleural effusions.  - placed on IV furosemide for diuresis.  -9/07 feeling better after IV furosemide, but serum cr has been rising. Consulted nephrology.  -9/08 resumed IV furosemide. -9/09 increased diuresis, serum cr still not at baseline.  -9/10 volume status more stable but continue to rise serum cr. Plan to transition to oral loop and thiazide diuretic therapy, if not good toleration, may need to start renal replacement therapy on this admission.   Subjective: -Feels better overall, still with some dyspnea and orthopnea  Assessment and Plan:  Acute on chronic diastolic CHF  -Echo EF 60 to 65%, severe LVH, RV with preserved systolic function. -Continue Imdur, hydralazine, labetalol -Diuresed with IV Lasix, now on torsemide and metolazone -See discussion below  AKI on CKD 4 Hypokalemia. Hypomagnesemia  -Left arm aVF placed 2 weeks ago -Volume status improving -Now on oral torsemide, metolazone, still a bit overloaded -Creatinine up to 7.7, appreciate nephrology input, starting HD today after placing HD cath  Metabolic bone disease, continue with phosphate binders.  Continue with calcitriol, sodium bicarbonate.   Anemia of chronic disease, with iron deficiency. Serum iron 50, TIBC 309, transferrin saturation 16 and ferritin 249  -Given IV iron and EPO  Essential hypertension, benign Continue labetalol, hydralazine, isosorbide and amlodipine.    Type 2 diabetes mellitus with hyperlipidemia (HCC) Continue with low dose of basal insulin 70/30: 10 units bid.  H/O: stroke -Stable condition, continue aspirin and statin  Obstructive sleep apnea Cpap  Class 2 obesity Calculated BMI 36.2    DVT prophylaxis: Hep SQ Code Status: Full Code Family Communication: none present Disposition Plan: Home likely 3days  Consultants:    Procedures:   Antimicrobials:    Objective: Vitals:   04/20/23 0439 04/20/23 0443 04/20/23 0758 04/20/23 1057  BP: 137/72  (!) 154/77 (!) 158/83  Pulse: 79  76 74  Resp: 20 20 20 20   Temp: 97.8 F (36.6 C) 97.8 F (36.6 C) 98.4 F (36.9 C) 98.1 F (36.7 C)  TempSrc: Oral Oral Oral Oral  SpO2: 90% 90% 94% 96%  Weight: 111.9 kg 111.9 kg  111.9 kg  Height:    5\' 9"  (1.753 m)    Intake/Output Summary (Last 24 hours) at 04/20/2023 1202 Last data filed at 04/20/2023 0900 Gross per 24 hour  Intake 570 ml  Output 1500 ml  Net -930 ml   Filed Weights   04/20/23 0439 04/20/23 0443 04/20/23 1057  Weight: 111.9 kg 111.9 kg 111.9 kg    Examination: Pleasant obese female sitting up in bed, AAOx3 HEENT: Neck obese unable to assess JVD CVS: S1-S2, regular rhythm Lungs: Decreased breath sounds at the bases Abdomen: Soft, nontender, bowel sounds present Extremities: No edema  Skin: no new rashes on exposed skin    Data Reviewed:   CBC: Recent Labs  Lab 04/17/23 0250 04/20/23 0315 04/20/23 1109  WBC 7.0 7.6  --   HGB 7.4* 7.8* 9.2*  HCT 22.9* 23.6* 27.0*  MCV 91.6 93.3  --   PLT 222 274  --  Basic Metabolic Panel: Recent Labs  Lab 04/14/23 0316 04/15/23 0306 04/16/23 0254 04/17/23 0250 04/18/23 0311 04/19/23 0401 04/20/23 0315 04/20/23 1109  NA 140 136 135 136 134* 137 134* 134*  K 3.8 3.7 3.7 3.7 3.5 3.5 3.2* 3.7  CL 104 101 103 102 99 96* 96* 99  CO2 20* 19* 19* 23 21* 25 24  --   GLUCOSE 81 135* 142* 138* 126* 131* 143* 143*  BUN 46* 45* 48* 52* 60* 70* 80* 81*   CREATININE 5.21* 5.58* 5.97* 5.87* 6.20* 6.53* 6.96* 7.70*  CALCIUM 7.8* 7.6* 8.3* 8.3* 8.4* 8.6* 8.1*  --   MG 1.5* 2.2  --   --   --  2.0  --   --   PHOS  --  5.1* 6.1* 6.2* 6.3* 6.5*  --   --    GFR: Estimated Creatinine Clearance: 11.7 mL/min (A) (by C-G formula based on SCr of 7.7 mg/dL (H)). Liver Function Tests: Recent Labs  Lab 04/15/23 0306 04/16/23 0254 04/17/23 0250 04/18/23 0311 04/19/23 0401  ALBUMIN 3.0* 3.0* 3.0* 3.2* 3.3*   No results for input(s): "LIPASE", "AMYLASE" in the last 168 hours. No results for input(s): "AMMONIA" in the last 168 hours. Coagulation Profile: No results for input(s): "INR", "PROTIME" in the last 168 hours. Cardiac Enzymes: No results for input(s): "CKTOTAL", "CKMB", "CKMBINDEX", "TROPONINI" in the last 168 hours. BNP (last 3 results) No results for input(s): "PROBNP" in the last 8760 hours. HbA1C: No results for input(s): "HGBA1C" in the last 72 hours. CBG: Recent Labs  Lab 04/19/23 1350 04/19/23 1535 04/19/23 2103 04/20/23 0603 04/20/23 1037  GLUCAP 71 107* 225* 136* 141*   Lipid Profile: No results for input(s): "CHOL", "HDL", "LDLCALC", "TRIG", "CHOLHDL", "LDLDIRECT" in the last 72 hours. Thyroid Function Tests: No results for input(s): "TSH", "T4TOTAL", "FREET4", "T3FREE", "THYROIDAB" in the last 72 hours. Anemia Panel: No results for input(s): "VITAMINB12", "FOLATE", "FERRITIN", "TIBC", "IRON", "RETICCTPCT" in the last 72 hours.  Urine analysis:    Component Value Date/Time   COLORURINE YELLOW 04/15/2023 1539   APPEARANCEUR HAZY (A) 04/15/2023 1539   LABSPEC 1.012 04/15/2023 1539   PHURINE 5.0 04/15/2023 1539   GLUCOSEU 50 (A) 04/15/2023 1539   HGBUR NEGATIVE 04/15/2023 1539   BILIRUBINUR NEGATIVE 04/15/2023 1539   KETONESUR NEGATIVE 04/15/2023 1539   PROTEINUR >=300 (A) 04/15/2023 1539   UROBILINOGEN 0.2 08/25/2011 2043   NITRITE NEGATIVE 04/15/2023 1539   LEUKOCYTESUR NEGATIVE 04/15/2023 1539   Sepsis  Labs: @LABRCNTIP (procalcitonin:4,lacticidven:4)  ) Recent Results (from the past 240 hour(s))  Resp panel by RT-PCR (RSV, Flu A&B, Covid) Anterior Nasal Swab     Status: None   Collection Time: 04/12/23  2:35 PM   Specimen: Anterior Nasal Swab  Result Value Ref Range Status   SARS Coronavirus 2 by RT PCR NEGATIVE NEGATIVE Final   Influenza A by PCR NEGATIVE NEGATIVE Final   Influenza B by PCR NEGATIVE NEGATIVE Final    Comment: (NOTE) The Xpert Xpress SARS-CoV-2/FLU/RSV plus assay is intended as an aid in the diagnosis of influenza from Nasopharyngeal swab specimens and should not be used as a sole basis for treatment. Nasal washings and aspirates are unacceptable for Xpert Xpress SARS-CoV-2/FLU/RSV testing.  Fact Sheet for Patients: BloggerCourse.com  Fact Sheet for Healthcare Providers: SeriousBroker.it  This test is not yet approved or cleared by the [place] FDA and has been authorized for detection and/or diagnosis of SARS-CoV-2 by FDA under an Emergency Use Authorization (EUA). This EUA will remain  in effect (meaning this test can be used) for the duration of the COVID-19 declaration under Section 564(b)(1) of the Act, 21 U.S.C. section 360bbb-3(b)(1), unless the authorization is terminated or revoked.     Resp Syncytial Virus by PCR NEGATIVE NEGATIVE Final    Comment: (NOTE) Fact Sheet for Patients: BloggerCourse.com  Fact Sheet for Healthcare Providers: SeriousBroker.it  This test is not yet approved or cleared by the [place] FDA and has been authorized for detection and/or diagnosis of SARS-CoV-2 by FDA under an Emergency Use Authorization (EUA). This EUA will remain in effect (meaning this test can be used) for the duration of the COVID-19 declaration under Section 564(b)(1) of the Act, 21 U.S.C. section 360bbb-3(b)(1), unless the authorization is  terminated or revoked.  Performed at [facility], [address] [place], [place] [address]      Radiology Studies: HYBRID OR IMAGING [facility] ONLY)  Result Date: 04/19/2023 There is no interpretation for this exam.  This order is for images obtained during a surgical procedure.  Please See "Surgeries" Tab for more information regarding the procedure.     Scheduled Meds:  [MAR Hold] amLODipine  5 mg Oral Daily   [MAR Hold] aspirin EC  81 mg Oral Daily   [MAR Hold] calcitRIOL  0.25 mcg Oral Daily   [MAR Hold] calcium acetate  667 mg Oral TID WC   [MAR Hold] Chlorhexidine Gluconate Cloth  6 each Topical Q0600   [MAR Hold] darbepoetin (ARANESP) injection - NON-DIALYSIS  100 mcg Subcutaneous Q Tue-1800   [MAR Hold] heparin  5,000 Units Subcutaneous Q8H   [MAR Hold] insulin aspart  0-5 Units Subcutaneous QHS   [MAR Hold] insulin aspart  0-9 Units Subcutaneous TID WC   [MAR Hold] insulin aspart protamine- aspart  10 Units Subcutaneous BID WC   [MAR Hold] isosorbide-hydrALAZINE  1 tablet Oral TID   [MAR Hold] labetalol  300 mg Oral BID   [MAR Hold] metolazone  5 mg Oral Daily   [MAR Hold] polyethylene glycol  17 g Oral BID   [MAR Hold] potassium chloride  20 mEq Oral BID   [MAR Hold] rosuvastatin  40 mg Oral q AM   [MAR Hold] sodium bicarbonate  650 mg Oral Daily   [MAR Hold] sodium chloride flush  3 mL Intravenous Q12H   [MAR Hold] torsemide  100 mg Oral BID   Continuous Infusions:  sodium chloride 10 mL/hr at 04/20/23 1114     LOS: 8 days    Time spent:     Zannie Cove, MD [facility]   04/20/2023, 12:02 PM

## 2023-04-20 NOTE — Progress Notes (Addendum)
[facility] Progress Note  Subjective: VSS, BP's good, UOP 1.5 L yesterday. For HD catheter and 1st HD today.  Labs worse.  No new symptoms.   Vitals:   04/20/23 0021 04/20/23 0439 04/20/23 0443 04/20/23 0758  BP:  137/72  (!) 154/77  Pulse: 74 79  76  Resp: 19 20 20 20   Temp: 97.8 F (36.6 C) 97.8 F (36.6 C) 97.8 F (36.6 C) 98.4 F (36.9 C)  TempSrc: Oral Oral Oral Oral  SpO2: 92% 90% 90% 94%  Weight:  111.9 kg 111.9 kg   Height:        Exam: Gen alert, no distress  Not visibly SOB No jvd or bruits Chest clear BL with normal WOB at rest RRR no MRG Abd soft ntnd no mass or ascites +bs Ext no pretib edema, RUE AVF +t/b. Neuro is alert, Ox 3 , nf, no asterixis      Home meds include - albuterol, norvasc 10, aspirin, rocaltrol 0.25 mcg, lasix 20 bid, insulin 70/30, bidil (20-37.5mg ) 2 tabs tid, labetalol 300 bid, crestor, semaglutide,         Date                       Creat               eGFR      2009- 2016             0.5- 0.8      2020                       1.34- 1.58      2021                       1.36- 2.33              2022                       1.69-  2.13       30- 37 ml/min      Jan- jun 2023         2.92                 19 ml/min      Jul - dec 2023        3.46- 5.20        11- 36ml/min      Mar 2024                3.62                 15      April                        4.00      May                        5.16       June                      5.03                 10       July  4.56                 11                          9/04- 9/07               4.58 >> 5.58    This admission        UA 9/07 - prot >300, 0-5 rbc/ wbc, 11-20 epis, no bact      UNa 22,  UCr 143       Renal US - 10.9/ 11.5 cm kidneys w/o hydronephrosis       Assessment/ Plan: AKI on CKD 5 - b/l creat 4.5- 5.0 from jun- July 2024, EGFR 10- 11 ml/min. Had R arm AVF placed 1 week ago in prep for suspected ESRD soon. Pt was admitted for SOB/ pulm  edema/ DOE.  She's diuresed some but required high dose diuretics and still isn't feeling great.  I think early uremic symptoms are playing a role and I have recommended initiation of dialysis for which she's agreeable.  HD catheter per [facility] - should be today.  HD 1 today, 2 tomorrow. CLIP to outpt HD underway. Acute/ chronic diast CHF - as above, vol overload w/ SOB much improved  - UF 1L with HD today - should be getting close to EDW.  HTN - lowered Bidil and norvasc dosing, also getting labetalol bid. BP's are stable today.  DM2 H/o CVA Anemia: Hb into 7s, with iron deficiency.  IV iron and ESA.  Hypokalemia: repleted.   Will follow - call with questions.    Estill Bakes MD [facility] Pager [phone]    Recent Labs  Lab 04/17/23 [phone] 04/18/23 0311 04/19/23 0401 04/20/23 0315  HGB 7.4*  --   --  7.8*  ALBUMIN 3.0* 3.2* 3.3*  --   CALCIUM 8.3* 8.4* 8.6* 8.1*  PHOS 6.2* 6.3* 6.5*  --   CREATININE 5.87* 6.20* 6.53* 6.96*  K 3.7 3.5 3.5 3.2*   Recent Labs  Lab 04/17/23 0913  IRON 50  TIBC 309  FERRITIN 249   Inpatient medications:  amLODipine  5 mg Oral Daily   aspirin EC  81 mg Oral Daily   calcitRIOL  0.25 mcg Oral Daily   calcium acetate  667 mg Oral TID WC   Chlorhexidine Gluconate Cloth  6 each Topical Q0600   darbepoetin (ARANESP) injection - NON-DIALYSIS  100 mcg Subcutaneous Q Tue-1800   heparin  5,000 Units Subcutaneous Q8H   insulin aspart  0-5 Units Subcutaneous QHS   insulin aspart  0-9 Units Subcutaneous TID WC   insulin aspart protamine- aspart  10 Units Subcutaneous BID WC   isosorbide-hydrALAZINE  1 tablet Oral TID   labetalol  300 mg Oral BID   metolazone  5 mg Oral Daily   polyethylene glycol  17 g Oral BID   rosuvastatin  40 mg Oral q AM   sodium bicarbonate  650 mg Oral Daily   sodium chloride flush  3 mL Intravenous Q12H   torsemide  100 mg Oral BID     acetaminophen **OR** acetaminophen, albuterol

## 2023-04-20 NOTE — Anesthesia Preprocedure Evaluation (Addendum)
Anesthesia Evaluation  Patient identified by MRN, date of birth, ID band Patient awake    Reviewed: Allergy & Precautions, NPO status , [name] Chart, lab work & pertinent test results  Airway Mallampati: III  TM Distance: >3 FB Neck ROM: Full    Dental  (+) Chipped, Missing   Pulmonary asthma , sleep apnea and Continuous Positive Airway Pressure Ventilation , former smoker   Pulmonary exam normal        Cardiovascular hypertension, Pt. on medications and Pt. on home beta blockers + CAD and +CHF  Normal cardiovascular exam  ECHO: 1. Left ventricular ejection fraction, by estimation, is 60 to 65%. The  left ventricle has normal function. The left ventricle has no regional  wall motion abnormalities. There is severe left ventricular hypertrophy.  Left ventricular diastolic parameters   are indeterminate. Elevated left atrial pressure.   2. Right ventricular systolic function is low normal. The right  ventricular size is normal.   3. A small pericardial effusion is present. The pericardial effusion is  posterior to the left ventricle. There is no evidence of cardiac  tamponade.   4. The mitral valve is grossly normal. Mild mitral valve regurgitation.  No evidence of mitral stenosis.   5. The aortic valve is tricuspid. There is mild calcification of the  aortic valve. There is mild thickening of the aortic valve. Aortic valve  regurgitation is mild. Aortic valve sclerosis/calcification is present,  without any evidence of aortic  stenosis.   6. The inferior vena cava is dilated in size with >50% respiratory  variability, suggesting right atrial pressure of 8 mmHg.     Neuro/Psych  PSYCHIATRIC DISORDERS Anxiety Depression    CVA    GI/Hepatic negative GI ROS, Neg liver ROS,,,  Endo/Other  diabetes, Insulin Dependent  Patient on GLP-1 Agonist. Last dose 8/29  Renal/GU ESRFRenal disease     Musculoskeletal  (+)  Arthritis ,    Abdominal  (+) + obese  Peds  Hematology  (+) Blood dyscrasia, anemia   Anesthesia Other Findings Pre-op diagnosis: ESRD   Reproductive/Obstetrics                             Anesthesia Physical Anesthesia Plan  ASA: 3  Anesthesia Plan: General   Post-op Pain Management:    Induction: Intravenous  PONV Risk Score and Plan: 3 and Ondansetron, Dexamethasone and Treatment may vary due to age or medical condition  Airway Management Planned: LMA  Additional Equipment:   Intra-op Plan:   Post-operative Plan: Extubation in OR  Informed Consent: I have reviewed the patients History and Physical, chart, labs and discussed the procedure including the risks, benefits and alternatives for the proposed anesthesia with the patient or authorized representative who has indicated his/her understanding and acceptance.     Dental advisory given  Plan Discussed with: CRNA  Anesthesia Plan Comments:        Anesthesia Quick Evaluation

## 2023-04-21 ENCOUNTER — Inpatient Hospital Stay (HOSPITAL_COMMUNITY): Admission: RE | Admit: 2023-04-21 | Payer: Medicaid Other | Source: Ambulatory Visit

## 2023-04-21 ENCOUNTER — Encounter (HOSPITAL_COMMUNITY): Payer: Self-pay | Admitting: Vascular Surgery

## 2023-04-21 DIAGNOSIS — I2510 Atherosclerotic heart disease of native coronary artery without angina pectoris: Secondary | ICD-10-CM | POA: Insufficient documentation

## 2023-04-21 DIAGNOSIS — Z89421 Acquired absence of other right toe(s): Secondary | ICD-10-CM | POA: Insufficient documentation

## 2023-04-21 DIAGNOSIS — E113293 Type 2 diabetes mellitus with mild nonproliferative diabetic retinopathy without macular edema, bilateral: Secondary | ICD-10-CM | POA: Insufficient documentation

## 2023-04-21 DIAGNOSIS — I5033 Acute on chronic diastolic (congestive) heart failure: Secondary | ICD-10-CM | POA: Diagnosis not present

## 2023-04-21 DIAGNOSIS — Z87891 Personal history of nicotine dependence: Secondary | ICD-10-CM | POA: Insufficient documentation

## 2023-04-21 DIAGNOSIS — D509 Iron deficiency anemia, unspecified: Secondary | ICD-10-CM | POA: Insufficient documentation

## 2023-04-21 DIAGNOSIS — I132 Hypertensive heart and chronic kidney disease with heart failure and with stage 5 chronic kidney disease, or end stage renal disease: Secondary | ICD-10-CM | POA: Insufficient documentation

## 2023-04-21 DIAGNOSIS — J45909 Unspecified asthma, uncomplicated: Secondary | ICD-10-CM | POA: Insufficient documentation

## 2023-04-21 LAB — CBC
HCT: 25.3 % — ABNORMAL LOW (ref 36.0–46.0)
Hemoglobin: 8.2 g/dL — ABNORMAL LOW (ref 12.0–15.0)
MCH: 30.6 pg (ref 26.0–34.0)
MCHC: 32.4 g/dL (ref 30.0–36.0)
MCV: 94.4 fL (ref 80.0–100.0)
Platelets: 263 10*3/uL (ref 150–400)
RBC: 2.68 MIL/uL — ABNORMAL LOW (ref 3.87–5.11)
RDW: 14.9 % (ref 11.5–15.5)
WBC: 7.1 10*3/uL (ref 4.0–10.5)
nRBC: 0 % (ref 0.0–0.2)

## 2023-04-21 LAB — GLUCOSE, CAPILLARY
Glucose-Capillary: 124 mg/dL — ABNORMAL HIGH (ref 70–99)
Glucose-Capillary: 190 mg/dL — ABNORMAL HIGH (ref 70–99)
Glucose-Capillary: 182 mg/dL — ABNORMAL HIGH (ref 70–99)

## 2023-04-21 LAB — BASIC METABOLIC PANEL
Anion gap: 14 (ref 5–15)
BUN: 60 mg/dL — ABNORMAL HIGH (ref 6–20)
CO2: 24 mmol/L (ref 22–32)
Calcium: 8.2 mg/dL — ABNORMAL LOW (ref 8.9–10.3)
Chloride: 95 mmol/L — ABNORMAL LOW (ref 98–111)
Creatinine, Ser: 5.44 mg/dL — ABNORMAL HIGH (ref 0.44–1.00)
GFR, Estimated: 9 mL/min — ABNORMAL LOW (ref >60)
Glucose, Bld: 139 mg/dL — ABNORMAL HIGH (ref 70–99)
Potassium: 3.6 mmol/L (ref 3.5–5.1)
Sodium: 133 mmol/L — ABNORMAL LOW (ref 135–145)

## 2023-04-21 MED ORDER — HEPARIN SODIUM (PORCINE) 1000 UNIT/ML IJ SOLN
INTRAMUSCULAR | Status: AC
  Administered 2023-04-21: 1900 [IU] via INTRAVENOUS_CENTRAL
  Filled 2023-04-21: qty 4

## 2023-04-21 NOTE — Progress Notes (Signed)
Still in dialysis to assess for CPAP

## 2023-04-21 NOTE — Progress Notes (Signed)
Pt has been accepted at [facility] on TTS 6:45 am chair time. Pt can start on Tuesday and will need to arrive at 6:10 am to complete paperwork prior treatment. Met with pt at bedside while pt was receiving HD. Discussed arrangements and schedule letter provided. Pt agreeable to plan. Arrangements added to AVS as well. Update provided to attending, nephrologist, pt's RN, and RN CM. Contacted renal PA to request that orders be sent to clinic at d/c. Will assist as needed.   Olivia Canter Renal Navigator [phone]

## 2023-04-21 NOTE — Progress Notes (Signed)
[name]PHROLOGY PROGRESS NOTE  Assessment/ Plan:  # AKI on CKD 5 now progressed to new ESRD- b/l creat 4.5- 5.0 from jun- July 2024, EGFR 10- 11 ml/min. Had R arm AVF placed 1 week prior in prep for suspected ESRD soon. Pt was admitted for SOB/ pulm edema/ DOE.  She's diuresed some but required high dose diuretics and still didn't feel well and some early uremic symptoms. Left IJ TDC placed by [facility] on 9/12 and received first dialysis.  Plan for second HD today. CLIP to outpt HD underway.  #Acute/ chronic diast CHF - as above, vol overload w/ SOB much improved, UF with HD.  Discontinue diuretics.  #HTN - lowered Bidil and norvasc dosing, also getting labetalol bid. BP's are stable today.   # Anemia: Hb into 7s, with iron deficiency.  IV iron and ESA.   # Hypokalemia: Managing with dialysis.  # CKD-MBD: Continue PhosLo for hyperphosphatemia.  On calcitriol.  Subjective: Seen and examined bedside.  Tolerated dialysis well.  Denies  nausea, vomiting, chest pain or shortness of breath.  No new event. Objective Vital signs in last 24 hours: Vitals:   04/21/23 0124 04/21/23 0136 04/21/23 0210 04/21/23 0432  BP: 122/64 117/63 127/65 136/70  Pulse: 66 68 71 66  Resp: 17 16 18 17   Temp:  97.7 F (36.5 C) 98.7 F (37.1 C) 98.7 F (37.1 C)  TempSrc:  Oral Oral Oral  SpO2: 99% 98% 97% 99%  Weight:      Height:       Weight change: -0.2 kg  Intake/Output Summary (Last 24 hours) at 04/21/2023 0926 Last data filed at 04/21/2023 0200 Gross per 24 hour  Intake 400 ml  Output 1725 ml  Net -1325 ml       Labs: RENAL PANEL Recent Labs  Lab 04/15/23 0306 04/16/23 0254 04/17/23 0250 04/18/23 0311 04/19/23 0401 04/20/23 0315 04/20/23 1109 04/21/23 0342  NA 136 135 136 134* 137 134* 134* 133*  K 3.7 3.7 3.7 3.5 3.5 3.2* 3.7 3.6  CL 101 103 102 99 96* 96* 99 95*  CO2 19* 19* 23 21* 25 24  --  24  GLUCOSE 135* 142* 138* 126* 131* 143* 143* 139*  BUN 45* 48*  52* 60* 70* 80* 81* 60*  CREATININE 5.58* 5.97* 5.87* 6.20* 6.53* 6.96* 7.70* 5.44*  CALCIUM 7.6* 8.3* 8.3* 8.4* 8.6* 8.1*  --  8.2*  MG 2.2  --   --   --  2.0  --   --   --   PHOS 5.1* 6.1* 6.2* 6.3* 6.5*  --   --   --   ALBUMIN 3.0* 3.0* 3.0* 3.2* 3.3*  --   --   --     Liver Function Tests: Recent Labs  Lab 04/17/23 0250 04/18/23 0311 04/19/23 0401  ALBUMIN 3.0* 3.2* 3.3*   No results for input(s): "LIPASE", "AMYLASE" in the last 168 hours. No results for input(s): "AMMONIA" in the last 168 hours. CBC: Recent Labs    12/02/22 0853 12/02/22 0903 01/06/23 0850 02/03/23 0957 02/03/23 1000 03/03/23 0840 03/03/23 1610 04/13/23 0252 04/17/23 0250 04/17/23 0913 04/20/23 0315 04/20/23 1109 04/21/23 0342  HGB  --    < >  --   --    < >  --    < > 7.5* 7.4*  --  7.8* 9.2* 8.2*  MCV  --   --   --   --   --   --    < >  90.6 91.6  --  93.3  --  94.4  FERRITIN 210  --  224 236  --  183  --   --   --  249  --   --   --   TIBC 361  --  281 309  --  302  --   --   --  309  --   --   --   IRON 68  --  22* 44  --  56  --   --   --  50  --   --   --    < > = values in this interval not displayed.    Cardiac Enzymes: No results for input(s): "CKTOTAL", "CKMB", "CKMBINDEX", "TROPONINI" in the last 168 hours. CBG: Recent Labs  Lab 04/20/23 1349 04/20/23 1449 04/20/23 1711 04/20/23 2117 04/21/23 0631  GLUCAP 132* 112* 166* 135* 124*    Iron Studies: No results for input(s): "IRON", "TIBC", "TRANSFERRIN", "FERRITIN" in the last 72 hours. Studies/Results: DG Chest Port 1 View  Result Date: 04/20/2023 CLINICAL DATA:  Status post dialysis catheter placement. EXAM: PORTABLE CHEST 1 VIEW COMPARISON:  Chest radiograph dated 04/16/2023. FINDINGS: Left IJ dialysis catheter with tip over the right atrium close to the cavoatrial junction. There is cardiomegaly with vascular congestion. No focal consolidation, pleural effusion, or pneumothorax. No acute osseous pathology. IMPRESSION: Left IJ  dialysis catheter with tip over the right atrium close to the cavoatrial junction. No pneumothorax. Electronically Signed   By: Elgie Collard M.D.   On: 04/20/2023 17:36   PERIPHERAL VASCULAR CATHETERIZATION  Result Date: 04/20/2023 See surgical note for result.  HYBRID OR IMAGING ([facility] ONLY)  Result Date: 04/19/2023 There is no interpretation for this exam.  This order is for images obtained during a surgical procedure.  Please See "Surgeries" Tab for more information regarding the procedure.    Medications: Infusions:   Scheduled Medications:  amLODipine  5 mg Oral Daily   aspirin EC  81 mg Oral Daily   calcitRIOL  0.25 mcg Oral Daily   calcium acetate  667 mg Oral TID WC   Chlorhexidine Gluconate Cloth  6 each Topical Q0600   darbepoetin (ARANESP) injection - NON-DIALYSIS  100 mcg Subcutaneous Q Tue-1800   heparin  5,000 Units Subcutaneous Q8H   insulin aspart  0-5 Units Subcutaneous QHS   insulin aspart  0-9 Units Subcutaneous TID WC   insulin aspart protamine- aspart  10 Units Subcutaneous BID WC   isosorbide-hydrALAZINE  1 tablet Oral TID   labetalol  300 mg Oral BID   metolazone  5 mg Oral Daily   polyethylene glycol  17 g Oral BID   potassium chloride  20 mEq Oral BID   rosuvastatin  40 mg Oral q AM   sodium bicarbonate  650 mg Oral Daily   sodium chloride flush  3 mL Intravenous Q12H   torsemide  100 mg Oral BID    have reviewed scheduled and prn medications.  Physical Exam: General:NAD, comfortable Heart:RRR, s1s2 nl Lungs:clear b/l, no crackle Abdomen:soft, Non-tender, non-distended Extremities: Trace peripheral edema Dialysis Access: Left IJ TDC placed by [facility].  Deroy Noah Jaynie Collins 04/21/2023,9:26 AM  LOS: 9 days

## 2023-04-21 NOTE — Progress Notes (Addendum)
PROGRESS NOTE    [name]  ION:[id number] DOB: 08/17/1970 DOA: 04/12/2023 PCP: Jackie Plum, MD  52 yo female with the past medical history of hypertension, dyslipidemia, heart failure, CKD stage IV, and obesity class 2. Recent AV fistula creation for HD on 04/04/23 presented w/progressive worsening dyspnea, palpitations, lightheadedness, and headaches. In ED, bun 46 cr 4,58, BNP 494, hgb 8,2 CXR w/cardiomegaly, bilateral hilar vascular congestion with bilateral symmetric central interstitial infiltrates, small bilateral pleural effusions.  - placed on IV furosemide for diuresis.  -9/07 feeling better after IV furosemide, but serum cr has been rising. Consulted nephrology.  -9/08 resumed IV furosemide. -9/09 increased diuresis, serum cr still not at baseline.  -9/10 volume status more stable but continue to rise serum cr. -9/12 vascular surgery consulted, tunneled dialysis catheter placed and started HD  Subjective: -Feels better overall  Assessment and Plan:  Acute on chronic diastolic CHF  -Echo EF 60 to 65%, severe LVH, RV with preserved systolic function. -Continue Imdur, hydralazine, labetalol -Diuresed with IV Lasix, now on torsemide and metolazone -Started HD, see discussion below  AKI on CKD 4 Hypokalemia. Hypomagnesemia  -Left arm aVF placed 2 weeks ago -Volume status improving -Now on oral torsemide, metolazone, -Creatinine up to 7.7 yesterday, appreciate nephrology input, left IJ HD catheter placed, started HD 9/12 -Renal social work team consulted -Dietician-consult  Metabolic bone disease, continue with phosphate binders.  Continue with calcitriol, sodium bicarbonate.   Anemia of chronic disease, with iron deficiency. Serum iron 50, TIBC 309, transferrin saturation 16 and ferritin 249  -Given IV iron and EPO -Trend  Essential hypertension, benign -Improving, discontinue amlodipine continue labetalol, bidil  Type 2 diabetes mellitus with  hyperlipidemia (HCC) Continue with low dose of basal insulin 70/30: 10 units bid.  H/O: stroke -Stable condition, continue aspirin and statin  Obstructive sleep apnea Cpap  Class 2 obesity Calculated BMI 36.2    DVT prophylaxis: Hep SQ Code Status: Full Code Family Communication: none present Disposition Plan: Home likely 2-3days  Consultants:    Procedures:   Antimicrobials:    Objective: Vitals:   04/21/23 0124 04/21/23 0136 04/21/23 0210 04/21/23 0432  BP: 122/64 117/63 127/65 136/70  Pulse: 66 68 71 66  Resp: 17 16 18 17   Temp:  97.7 F (36.5 C) 98.7 F (37.1 C) 98.7 F (37.1 C)  TempSrc:  Oral Oral Oral  SpO2: 99% 98% 97% 99%  Weight:      Height:        Intake/Output Summary (Last 24 hours) at 04/21/2023 1001 Last data filed at 04/21/2023 0200 Gross per 24 hour  Intake 400 ml  Output 1725 ml  Net -1325 ml   Filed Weights   04/20/23 0443 04/20/23 1057 04/20/23 2327  Weight: 111.9 kg 111.9 kg 113.8 kg    Examination: Pleasant obese female sitting up in bed, AAOx3 HEENT: Positive JVD, left IJ HD catheter 's: S1-S2, regular rhythm Lungs: Clear anteriorly Abdomen: Soft, nontender, bowel sounds present  Extremities: Trace edema  Skin: no new rashes on exposed skin    Data Reviewed:   CBC: Recent Labs  Lab 04/17/23 0250 04/20/23 0315 04/20/23 1109 04/21/23 0342  WBC 7.0 7.6  --  7.1  HGB 7.4* 7.8* 9.2* 8.2*  HCT 22.9* 23.6* 27.0* 25.3*  MCV 91.6 93.3  --  94.4  PLT 222 274  --  263   Basic Metabolic Panel: Recent Labs  Lab 04/15/23 0306 04/16/23 0254 04/17/23 0250 04/18/23 0311 04/19/23 0401 04/20/23 0315 04/20/23  1109 04/21/23 0342  NA 136 135 136 134* 137 134* 134* 133*  K 3.7 3.7 3.7 3.5 3.5 3.2* 3.7 3.6  CL 101 103 102 99 96* 96* 99 95*  CO2 19* 19* 23 21* 25 24  --  24  GLUCOSE 135* 142* 138* 126* 131* 143* 143* 139*  BUN 45* 48* 52* 60* 70* 80* 81* 60*  CREATININE 5.58* 5.97* 5.87* 6.20* 6.53* 6.96* 7.70* 5.44*   CALCIUM 7.6* 8.3* 8.3* 8.4* 8.6* 8.1*  --  8.2*  MG 2.2  --   --   --  2.0  --   --   --   PHOS 5.1* 6.1* 6.2* 6.3* 6.5*  --   --   --    GFR: Estimated Creatinine Clearance: 16.6 mL/min (A) (by C-G formula based on SCr of 5.44 mg/dL (H)). Liver Function Tests: Recent Labs  Lab 04/15/23 0306 04/16/23 0254 04/17/23 0250 04/18/23 0311 04/19/23 0401  ALBUMIN 3.0* 3.0* 3.0* 3.2* 3.3*   No results for input(s): "LIPASE", "AMYLASE" in the last 168 hours. No results for input(s): "AMMONIA" in the last 168 hours. Coagulation Profile: No results for input(s): "INR", "PROTIME" in the last 168 hours. Cardiac Enzymes: No results for input(s): "CKTOTAL", "CKMB", "CKMBINDEX", "TROPONINI" in the last 168 hours. BNP (last 3 results) No results for input(s): "PROBNP" in the last 8760 hours. HbA1C: No results for input(s): "HGBA1C" in the last 72 hours. CBG: Recent Labs  Lab 04/20/23 1349 04/20/23 1449 04/20/23 1711 04/20/23 2117 04/21/23 0631  GLUCAP 132* 112* 166* 135* 124*   Lipid Profile: No results for input(s): "CHOL", "HDL", "LDLCALC", "TRIG", "CHOLHDL", "LDLDIRECT" in the last 72 hours. Thyroid Function Tests: No results for input(s): "TSH", "T4TOTAL", "FREET4", "T3FREE", "THYROIDAB" in the last 72 hours. Anemia Panel: No results for input(s): "VITAMINB12", "FOLATE", "FERRITIN", "TIBC", "IRON", "RETICCTPCT" in the last 72 hours.  Urine analysis:    Component Value Date/Time   COLORURINE YELLOW 04/15/2023 1539   APPEARANCEUR HAZY (A) 04/15/2023 1539   LABSPEC 1.012 04/15/2023 1539   PHURINE 5.0 04/15/2023 1539   GLUCOSEU 50 (A) 04/15/2023 1539   HGBUR NEGATIVE 04/15/2023 1539   BILIRUBINUR NEGATIVE 04/15/2023 1539   KETONESUR NEGATIVE 04/15/2023 1539   PROTEINUR >=300 (A) 04/15/2023 1539   UROBILINOGEN 0.2 08/25/2011 2043   NITRITE NEGATIVE 04/15/2023 1539   LEUKOCYTESUR NEGATIVE 04/15/2023 1539   Sepsis Labs: @LABRCNTIP (procalcitonin:4,lacticidven:4)  ) Recent  Results (from the past 240 hour(s))  Resp panel by RT-PCR (RSV, Flu A&B, Covid) Anterior Nasal Swab     Status: None   Collection Time: 04/12/23  2:35 PM   Specimen: Anterior Nasal Swab  Result Value Ref Range Status   SARS Coronavirus 2 by RT PCR NEGATIVE NEGATIVE Final   Influenza A by PCR NEGATIVE NEGATIVE Final   Influenza B by PCR NEGATIVE NEGATIVE Final    Comment: (NOTE) The Xpert Xpress SARS-CoV-2/FLU/RSV plus assay is intended as an aid in the diagnosis of influenza from Nasopharyngeal swab specimens and should not be used as a sole basis for treatment. Nasal washings and aspirates are unacceptable for Xpert Xpress SARS-CoV-2/FLU/RSV testing.  Fact Sheet for Patients: BloggerCourse.com  Fact Sheet for Healthcare Providers: SeriousBroker.it  This test is not yet approved or cleared by the [place] FDA and has been authorized for detection and/or diagnosis of SARS-CoV-2 by FDA under an Emergency Use Authorization (EUA). This EUA will remain in effect (meaning this test can be used) for the duration of the COVID-19 declaration under Section 564(b)(1)  of the Act, 21 U.S.C. section 360bbb-3(b)(1), unless the authorization is terminated or revoked.     Resp Syncytial Virus by PCR NEGATIVE NEGATIVE Final    Comment: (NOTE) Fact Sheet for Patients: BloggerCourse.com  Fact Sheet for Healthcare Providers: SeriousBroker.it  This test is not yet approved or cleared by the [place] FDA and has been authorized for detection and/or diagnosis of SARS-CoV-2 by FDA under an Emergency Use Authorization (EUA). This EUA will remain in effect (meaning this test can be used) for the duration of the COVID-19 declaration under Section 564(b)(1) of the Act, 21 U.S.C. section 360bbb-3(b)(1), unless the authorization is terminated or revoked.  Performed at [facility],  [address], [place], [place] [address]      Radiology Studies: DG Chest Port 1 View  Result Date: 04/20/2023 CLINICAL DATA:  Status post dialysis catheter placement. EXAM: PORTABLE CHEST 1 VIEW COMPARISON:  Chest radiograph dated 04/16/2023. FINDINGS: Left IJ dialysis catheter with tip over the right atrium close to the cavoatrial junction. There is cardiomegaly with vascular congestion. No focal consolidation, pleural effusion, or pneumothorax. No acute osseous pathology. IMPRESSION: Left IJ dialysis catheter with tip over the right atrium close to the cavoatrial junction. No pneumothorax. Electronically Signed   By: Elgie Collard M.D.   On: 04/20/2023 17:36   PERIPHERAL VASCULAR CATHETERIZATION  Result Date: 04/20/2023 See surgical note for result.  HYBRID OR IMAGING ([facility] ONLY)  Result Date: 04/19/2023 There is no interpretation for this exam.  This order is for images obtained during a surgical procedure.  Please See "Surgeries" Tab for more information regarding the procedure.     Scheduled Meds:  amLODipine  5 mg Oral Daily   aspirin EC  81 mg Oral Daily   calcitRIOL  0.25 mcg Oral Daily   calcium acetate  667 mg Oral TID WC   Chlorhexidine Gluconate Cloth  6 each Topical Q0600   darbepoetin (ARANESP) injection - NON-DIALYSIS  100 mcg Subcutaneous Q Tue-1800   heparin  5,000 Units Subcutaneous Q8H   insulin aspart  0-5 Units Subcutaneous QHS   insulin aspart  0-9 Units Subcutaneous TID WC   insulin aspart protamine- aspart  10 Units Subcutaneous BID WC   isosorbide-hydrALAZINE  1 tablet Oral TID   labetalol  300 mg Oral BID   polyethylene glycol  17 g Oral BID   rosuvastatin  40 mg Oral q AM   sodium chloride flush  3 mL Intravenous Q12H   Continuous Infusions:     LOS: 9 days    Time spent:     Zannie Cove, MD [facility]   04/21/2023, 10:01 AM

## 2023-04-21 NOTE — Progress Notes (Signed)
   04/21/23 0210  Vitals  Temp 98.7 F (37.1 C)  Temp Source Oral  BP 127/65  MAP (mmHg) 82  BP Location Left Arm  BP Method Automatic  Patient Position (if appropriate) Lying  Pulse Rate 71  Pulse Rate Source Monitor  ECG Heart Rate 72  Resp 18  Level of Consciousness  Level of Consciousness Alert  MEWS COLOR  MEWS Score Color Green  Oxygen Therapy  SpO2 97 %  O2 Device Nasal Cannula  O2 Flow Rate (L/min) 1 L/min  Patient Activity (if Appropriate) In bed  Pulse Oximetry Type Continuous  Pain Assessment  Pain Scale 0-10  Pain Score 8  Pain Type Surgical pain  Pain Location Chest  Pain Orientation Left (new tunneled dialysis site)  Patients Stated Pain Goal 0  Pain Intervention(s) Medication (See eMAR)  MEWS Score  MEWS Temp 0  MEWS Systolic 0  MEWS Pulse 0  MEWS RR 0  MEWS LOC 0  MEWS Score 0   Pt arrived from HD session. A&ox4. Ambulated w/ cane to BR. Slow and steady. PRN tylenol given for c/o pain to surgical site. Dressing dry/intact. Scant amount of dry blood noted below dressing. Also c/o pain to left lower abd. Heat packs provided for relief.

## 2023-04-21 NOTE — Plan of Care (Signed)

## 2023-04-21 NOTE — Progress Notes (Signed)
Received patient in bed to unit.  Alert and oriented.  Informed consent signed and in chart.   TX duration:2.5  Patient tolerated well.  Transported back to the room  Alert, without acute distress.  Hand-off given to patient's nurse.   Access used: catheter Access issues: n/a  Total UF removed: 1L Medication(s) given: n/a Post HD weight: 111.5kg    04/21/23 1300  Vitals  Temp 98 F (36.7 C)  Temp Source Axillary  BP 139/69  MAP (mmHg) 89  Pulse Rate 72  ECG Heart Rate 72  Resp 15  Oxygen Therapy  SpO2 93 %  O2 Device Room Air  During Treatment Monitoring  Blood Flow Rate (mL/min) 249 mL/min  Arterial Pressure (mmHg) -103.02 mmHg  Venous Pressure (mmHg) 105.45 mmHg  TMP (mmHg) 30.5 mmHg  Ultrafiltration Rate (mL/min) 663 mL/min  Dialysate Flow Rate (mL/min) 299 ml/min  Dialysate Potassium Concentration 3  Dialysate Calcium Concentration 2.5  Duration of HD Treatment -hour(s) 2.49 hour(s)  Cumulative Fluid Removed (mL) per Treatment  996.41  HD Safety Checks Performed Yes  Intra-Hemodialysis Comments Tx completed;Tolerated well  Post Treatment  Dialyzer Clearance Lightly streaked  Hemodialysis Intake (mL) 0 mL  Liters Processed 37.5  Fluid Removed (mL) 1000 mL  Tolerated HD Treatment Yes  Hemodialysis Catheter Left Internal jugular Double lumen Permanent (Tunneled)  Placement Date/Time: 04/20/23 1319   Serial / Lot #: [id number]  Expiration Date: 05/10/27  Time Out: Correct patient;Correct site;Correct procedure  Maximum sterile barrier precautions: Hand hygiene;Cap;Large sterile sheet;Sterile probe cover;Mask;St...  Site Condition No complications  Blue Lumen Status Heparin locked  Red Lumen Status Heparin locked  Catheter fill solution Heparin 1000 units/ml  Catheter fill volume (Arterial) 1.9 cc  Catheter fill volume (Venous) 1.9  Dressing Type Transparent  Dressing Status Antimicrobial disc in place  Interventions New dressing  Drainage Description None   Dressing Change Due 04/28/23  Post treatment catheter status Capped and Clamped     [facility]

## 2023-04-21 NOTE — Progress Notes (Signed)
TDC site CDI. No complaints. Working well.   Victorino Sparrow MD

## 2023-04-21 NOTE — Progress Notes (Signed)
Nutrition Consult  Received consult for renal diet education. Handouts were left at bedside 9/12 as patient was very sleepy. Patient is currently in HD. She is still not appropriate for diet education at this time. Will add renal education handouts to discharge summary. Patient will also receive RD assessment and education at outpatient dialysis center.   Gabriel Rainwater RD, LDN, CNSC Please refer to Amion for contact information.

## 2023-04-22 ENCOUNTER — Other Ambulatory Visit: Payer: Self-pay | Admitting: "Endocrinology

## 2023-04-22 DIAGNOSIS — I5033 Acute on chronic diastolic (congestive) heart failure: Secondary | ICD-10-CM | POA: Diagnosis not present

## 2023-04-22 LAB — BASIC METABOLIC PANEL
Anion gap: 12 (ref 5–15)
BUN: 52 mg/dL — ABNORMAL HIGH (ref 6–20)
CO2: 27 mmol/L (ref 22–32)
Calcium: 8.5 mg/dL — ABNORMAL LOW (ref 8.9–10.3)
Chloride: 96 mmol/L — ABNORMAL LOW (ref 98–111)
Creatinine, Ser: 4.86 mg/dL — ABNORMAL HIGH (ref 0.44–1.00)
GFR, Estimated: 10 mL/min — ABNORMAL LOW (ref >60)
Glucose, Bld: 153 mg/dL — ABNORMAL HIGH (ref 70–99)
Potassium: 3.8 mmol/L (ref 3.5–5.1)
Sodium: 135 mmol/L (ref 135–145)

## 2023-04-22 LAB — CBC
HCT: 25.4 % — ABNORMAL LOW (ref 36.0–46.0)
Hemoglobin: 8 g/dL — ABNORMAL LOW (ref 12.0–15.0)
MCH: 29.3 pg (ref 26.0–34.0)
MCHC: 31.5 g/dL (ref 30.0–36.0)
MCV: 93 fL (ref 80.0–100.0)
Platelets: 254 10*3/uL (ref 150–400)
RBC: 2.73 MIL/uL — ABNORMAL LOW (ref 3.87–5.11)
RDW: 15 % (ref 11.5–15.5)
WBC: 7.3 10*3/uL (ref 4.0–10.5)
nRBC: 0 % (ref 0.0–0.2)

## 2023-04-22 LAB — GLUCOSE, CAPILLARY: Glucose-Capillary: 138 mg/dL — ABNORMAL HIGH (ref 70–99)

## 2023-04-22 MED ORDER — CALCIUM ACETATE (PHOS BINDER) 667 MG PO CAPS: 667.0000 mg | ORAL_CAPSULE | Freq: Three times a day (TID) | ORAL | 1 refills | Status: DC

## 2023-04-22 MED ORDER — HEPARIN SODIUM (PORCINE) 1000 UNIT/ML DIALYSIS
1000.0000 [IU] | INTRAMUSCULAR | Status: DC | PRN
  Administered 2023-04-22 (×2): 1900 [IU]
  Filled 2023-04-22 (×3): qty 1

## 2023-04-22 MED ORDER — HEPARIN SODIUM (PORCINE) 1000 UNIT/ML DIALYSIS
20.0000 [IU]/kg | INTRAMUSCULAR | Status: DC | PRN
  Administered 2023-04-22: 2200 [IU] via INTRAVENOUS_CENTRAL
  Filled 2023-04-22 (×3): qty 3

## 2023-04-22 MED ORDER — INSULIN ASPART PROT & ASPART (70-30 MIX) 100 UNIT/ML PEN: 10.0000 [IU] | PEN_INJECTOR | Freq: Two times a day (BID) | SUBCUTANEOUS | Status: AC

## 2023-04-22 MED ORDER — CHLORHEXIDINE GLUCONATE CLOTH 2 % EX PADS
6.0000 | MEDICATED_PAD | Freq: Every day | CUTANEOUS | Status: DC
  Administered 2023-04-22: 6 via TOPICAL

## 2023-04-22 NOTE — Procedures (Signed)
Patient was seen on dialysis and the procedure was supervised.  BFR 400  Via TDC BP is  142/73.   Patient appears to be tolerating treatment well.  [name] [name] 04/22/2023

## 2023-04-22 NOTE — Progress Notes (Signed)
   04/22/23 0021  BiPAP/CPAP/SIPAP  BiPAP/CPAP/SIPAP Pt Type Adult  Reason BIPAP/CPAP not in use Non-compliant (refused)  Patient Home Equipment No  BiPAP/CPAP /SiPAP Vitals  Temp 98.7 F (37.1 C)  Pulse Rate 75  Resp 20  BP (!) 127/55  SpO2 92 %  MEWS Score/Color  MEWS Score 0  MEWS Score Color Green

## 2023-04-22 NOTE — Progress Notes (Signed)
HD Progress Note:   04/22/23 1455  Vitals  Temp 98 F (36.7 C)  Temp Source Oral  BP (!) 155/80  MAP (mmHg) 103  BP Location Left Wrist  BP Method Automatic  Patient Position (if appropriate) Lying  Pulse Rate 69  Pulse Rate Source Monitor  Resp 12  Oxygen Therapy  SpO2 100 %  O2 Device Room Air  During Treatment Monitoring  Blood Flow Rate (mL/min) 299 mL/min  Arterial Pressure (mmHg) -136.15 mmHg  Venous Pressure (mmHg) 136.36 mmHg  TMP (mmHg) 17.17 mmHg  Ultrafiltration Rate (mL/min) 510 mL/min  Dialysate Flow Rate (mL/min) 300 ml/min  Duration of HD Treatment -hour(s) 2.96 hour(s)  Cumulative Fluid Removed (mL) per Treatment  980.97  HD Safety Checks Performed Yes  Intra-Hemodialysis Comments Tx completed  Dialysis Fluid Bolus Normal Saline  Bolus Amount (mL) 300 mL  Post Treatment  Dialyzer Clearance Lightly streaked  Hemodialysis Intake (mL) 0 mL  Liters Processed 54  Fluid Removed (mL) 1000 mL  Tolerated HD Treatment Yes  Post-Hemodialysis Comments Tolerated HD well. Denies any symptoms of complication.  Note  Patient Observations HD tolerated well.  Hemodialysis Catheter Left Internal jugular Double lumen Permanent (Tunneled)  Placement Date/Time: 04/20/23 1319   Serial / Lot #: [id number]  Expiration Date: 05/10/27  Time Out: Correct patient;Correct site;Correct procedure  Maximum sterile barrier precautions: Hand hygiene;Cap;Large sterile sheet;Sterile probe cover;Mask;St...  Site Condition No complications  Blue Lumen Status Flushed;Heparin locked;Dead end cap in place  Red Lumen Status Flushed;Heparin locked;Dead end cap in place  Purple Lumen Status N/A  Catheter fill solution Heparin 1000 units/ml  Catheter fill volume (Arterial) 1.9 cc  Catheter fill volume (Venous) 1.9  Dressing Type Transparent  Dressing Status Antimicrobial disc in place  Interventions Dressing reinforced  Drainage Description None  Dressing Change Due 04/28/23  Post treatment  catheter status Capped and Clamped

## 2023-04-22 NOTE — Discharge Planning (Signed)
  [facility] Dialysis Discharge Orders   Patient Name: [name]  Admission Date: 04/12/2023 Discharge Date: 04/22/2023 Dialysis Unit: [facility] (TTS 6:45am chair time)  Admitting Diagnosis: Acute exacerbation of CHF (congestive heart failure) (HCC) [I50.9] Acute on chronic diastolic congestive heart failure (HCC) [I50.33] Chronic kidney disease, unspecified CKD stage [N18.9] ESRD N18.6 Anemia of chronic disease HTN  Discharge Labs: Basic Metabolic Panel: Recent Labs  Lab 04/17/23 0250 04/18/23 0311 04/19/23 0401 04/20/23 0315 04/20/23 1109 04/21/23 0342 04/22/23 0250  NA 136 134* 137 134* 134* 133* 135  K 3.7 3.5 3.5 3.2* 3.7 3.6 3.8  CL 102 99 96* 96* 99 95* 96*  CO2 23 21* 25 24  --  24 27  GLUCOSE 138* 126* 131* 143* 143* 139* 153*  BUN 52* 60* 70* 80* 81* 60* 52*  CREATININE 5.87* 6.20* 6.53* 6.96* 7.70* 5.44* 4.86*  CALCIUM 8.3* 8.4* 8.6* 8.1*  --  8.2* 8.5*  PHOS 6.2* 6.3* 6.5*  --   --   --   --    CBC: Recent Labs  Lab 04/17/23 0250 04/20/23 0315 04/20/23 1109 04/21/23 0342 04/22/23 0250  WBC 7.0 7.6  --  7.1 7.3  HGB 7.4* 7.8* 9.2* 8.2* 8.0*  HCT 22.9* 23.6* 27.0* 25.3* 25.4*  MCV 91.6 93.3  --  94.4 93.0  PLT 222 274  --  263 254   PMHx: new start ESRD, HTN, T2DM, HL, obesity, OSA, Hx CVA (02/2019), PVD (Hx R great toe amp), asthma  Dialysis Orders: 3.5hr x 1, then 4hr 180 dialyzer BFR 350 x 1, then 400 DFR A1.5 EDW 111kg 3K/2.25Ca bath No Na or UF profile TDC - placed 9/12 by [facility].  ** Does have maturing RUE BC AVF - placed 04/04/23 by Dr. Karin Lieu. Hx failed LUE AVF. Next appt 05/11/23  Aranesp Given: yes - on 9/10 PRBC's Given: no  Medications: - ESA: Start Mircera IV q 2 weeks - next dose 9/17 - Venofer 100mg  x 10 doses (tsat 16 on 04/17/23) - BMM: Currently on PO calcitriol 0.9mcg daily -> ask to stop and start in-center calcitriol 0.5 q HD  Other/Appts/Lab orders: -  Get full monthly labs on 1st day there, get K weekly - 1st HD 9/12, cause of ESRD - diabetic nephropathy - On Ozempic - monitor for weight loss, challenge down EDW as appropriate  Julien Nordmann, PA-C 04/22/2023, 11:21 AM  [facility] [phone]

## 2023-04-22 NOTE — Discharge Summary (Signed)
microbiology, ancillary and laboratory) are listed below for reference.    Significant Diagnostic Studies: DG Chest Port 1 View  Result Date: 04/20/2023 CLINICAL DATA:  Status post dialysis catheter placement. EXAM: PORTABLE CHEST 1 VIEW COMPARISON:  Chest radiograph dated 04/16/2023. FINDINGS: Left IJ dialysis catheter with tip over the right atrium close to the cavoatrial junction. There is cardiomegaly with vascular congestion. No focal consolidation, pleural effusion, or pneumothorax. No acute osseous pathology. IMPRESSION: Left IJ dialysis catheter with tip over the right atrium close to the cavoatrial junction. No pneumothorax. Electronically Signed   By: Elgie Collard M.D.   On: 04/20/2023 17:36   PERIPHERAL VASCULAR CATHETERIZATION  Result Date: 04/20/2023 See surgical note for result.  HYBRID OR IMAGING ([facility] ONLY)  Result Date: 04/19/2023 There is no interpretation for this exam.  This order is for images obtained during a surgical procedure.  Please See "Surgeries" Tab for more  information regarding the procedure.   DG CHEST PORT 1 VIEW  Result Date: 04/16/2023 CLINICAL DATA:  Increased shortness of breath EXAM: PORTABLE CHEST 1 VIEW COMPARISON:  CXR 04/12/23 FINDINGS: Cardiomegaly.Trace bilateral pleural effusions. Mild pulmonary edema. No focal airspace opacity. No radiographically apparent displaced rib fracture. Visualized upper abdomen is unremarkable. IMPRESSION: Cardiomegaly with mild pulmonary edema and trace bilateral pleural effusions. Electronically Signed   By: Lorenza Cambridge M.D.   On: 04/16/2023 11:44   US RENAL  Result Date: 04/15/2023 CLINICAL DATA:  Acute renal failure superimposed on stage 4 chronic kidney disease EXAM: RENAL / URINARY TRACT ULTRASOUND COMPLETE COMPARISON:  04/20/2022 FINDINGS: Right Kidney: Renal measurements: 10.9 x 4.3 x 6.4 cm = volume: 158 mL. Mild diffuse renal cortical atrophy. Cortical echogenicity is mildly diffusely increased in keeping with changes of underlying medical renal disease. No hydronephrosis. No intrarenal masses or calcifications. No perinephric fluid collections. Left Kidney: Renal measurements: 11.5 x 6.2 x 6.1 cm = volume: 228 mL. Preserved renal cortical thickness. Renal cortical echogenicity is normal. Slightly limited evaluation of the lower pole the left kidney due to overlying osseous structures. No hydronephrosis. No intrarenal masses or calcifications. No perinephric fluid collections are seen. Bladder: The bladder wall appears thickened and trabeculated circumferentially. No intraluminal masses or calcifications are seen. Other: Mild splenomegaly is noted with the spleen measuring up to 13.5 cm in greatest dimension. IMPRESSION: 1. Mild asymmetric right renal cortical atrophy. 2. Mild splenomegaly. 3. Thickened and trabeculated bladder wall, possibly reflecting changes of chronic bladder outlet obstruction. The bladder is not distended. Electronically Signed   By: Helyn Numbers M.D.   On: 04/15/2023 17:41    ECHOCARDIOGRAM COMPLETE  Result Date: 04/12/2023    ECHOCARDIOGRAM REPORT   Patient Name:   [name] Date of Exam: 04/12/2023 Medical Rec #:  [id number]       Height:       69.0 in Accession #:    [id number]      Weight:       246.0 lb Date of Birth:  Dec 20, 1970      BSA:          2.256 m Patient Age:    52 years        BP:           138/71 mmHg Patient Gender: F               HR:           80 bpm. Exam Location:  Inpatient Procedure: 2D Echo, Color Doppler and Cardiac Doppler Indications:  microbiology, ancillary and laboratory) are listed below for reference.    Significant Diagnostic Studies: DG Chest Port 1 View  Result Date: 04/20/2023 CLINICAL DATA:  Status post dialysis catheter placement. EXAM: PORTABLE CHEST 1 VIEW COMPARISON:  Chest radiograph dated 04/16/2023. FINDINGS: Left IJ dialysis catheter with tip over the right atrium close to the cavoatrial junction. There is cardiomegaly with vascular congestion. No focal consolidation, pleural effusion, or pneumothorax. No acute osseous pathology. IMPRESSION: Left IJ dialysis catheter with tip over the right atrium close to the cavoatrial junction. No pneumothorax. Electronically Signed   By: Elgie Collard M.D.   On: 04/20/2023 17:36   PERIPHERAL VASCULAR CATHETERIZATION  Result Date: 04/20/2023 See surgical note for result.  HYBRID OR IMAGING ([facility] ONLY)  Result Date: 04/19/2023 There is no interpretation for this exam.  This order is for images obtained during a surgical procedure.  Please See "Surgeries" Tab for more  information regarding the procedure.   DG CHEST PORT 1 VIEW  Result Date: 04/16/2023 CLINICAL DATA:  Increased shortness of breath EXAM: PORTABLE CHEST 1 VIEW COMPARISON:  CXR 04/12/23 FINDINGS: Cardiomegaly.Trace bilateral pleural effusions. Mild pulmonary edema. No focal airspace opacity. No radiographically apparent displaced rib fracture. Visualized upper abdomen is unremarkable. IMPRESSION: Cardiomegaly with mild pulmonary edema and trace bilateral pleural effusions. Electronically Signed   By: Lorenza Cambridge M.D.   On: 04/16/2023 11:44   US RENAL  Result Date: 04/15/2023 CLINICAL DATA:  Acute renal failure superimposed on stage 4 chronic kidney disease EXAM: RENAL / URINARY TRACT ULTRASOUND COMPLETE COMPARISON:  04/20/2022 FINDINGS: Right Kidney: Renal measurements: 10.9 x 4.3 x 6.4 cm = volume: 158 mL. Mild diffuse renal cortical atrophy. Cortical echogenicity is mildly diffusely increased in keeping with changes of underlying medical renal disease. No hydronephrosis. No intrarenal masses or calcifications. No perinephric fluid collections. Left Kidney: Renal measurements: 11.5 x 6.2 x 6.1 cm = volume: 228 mL. Preserved renal cortical thickness. Renal cortical echogenicity is normal. Slightly limited evaluation of the lower pole the left kidney due to overlying osseous structures. No hydronephrosis. No intrarenal masses or calcifications. No perinephric fluid collections are seen. Bladder: The bladder wall appears thickened and trabeculated circumferentially. No intraluminal masses or calcifications are seen. Other: Mild splenomegaly is noted with the spleen measuring up to 13.5 cm in greatest dimension. IMPRESSION: 1. Mild asymmetric right renal cortical atrophy. 2. Mild splenomegaly. 3. Thickened and trabeculated bladder wall, possibly reflecting changes of chronic bladder outlet obstruction. The bladder is not distended. Electronically Signed   By: Helyn Numbers M.D.   On: 04/15/2023 17:41    ECHOCARDIOGRAM COMPLETE  Result Date: 04/12/2023    ECHOCARDIOGRAM REPORT   Patient Name:   [name] Date of Exam: 04/12/2023 Medical Rec #:  [id number]       Height:       69.0 in Accession #:    [id number]      Weight:       246.0 lb Date of Birth:  Dec 20, 1970      BSA:          2.256 m Patient Age:    52 years        BP:           138/71 mmHg Patient Gender: F               HR:           80 bpm. Exam Location:  Inpatient Procedure: 2D Echo, Color Doppler and Cardiac Doppler Indications:  microbiology, ancillary and laboratory) are listed below for reference.    Significant Diagnostic Studies: DG Chest Port 1 View  Result Date: 04/20/2023 CLINICAL DATA:  Status post dialysis catheter placement. EXAM: PORTABLE CHEST 1 VIEW COMPARISON:  Chest radiograph dated 04/16/2023. FINDINGS: Left IJ dialysis catheter with tip over the right atrium close to the cavoatrial junction. There is cardiomegaly with vascular congestion. No focal consolidation, pleural effusion, or pneumothorax. No acute osseous pathology. IMPRESSION: Left IJ dialysis catheter with tip over the right atrium close to the cavoatrial junction. No pneumothorax. Electronically Signed   By: Elgie Collard M.D.   On: 04/20/2023 17:36   PERIPHERAL VASCULAR CATHETERIZATION  Result Date: 04/20/2023 See surgical note for result.  HYBRID OR IMAGING ([facility] ONLY)  Result Date: 04/19/2023 There is no interpretation for this exam.  This order is for images obtained during a surgical procedure.  Please See "Surgeries" Tab for more  information regarding the procedure.   DG CHEST PORT 1 VIEW  Result Date: 04/16/2023 CLINICAL DATA:  Increased shortness of breath EXAM: PORTABLE CHEST 1 VIEW COMPARISON:  CXR 04/12/23 FINDINGS: Cardiomegaly.Trace bilateral pleural effusions. Mild pulmonary edema. No focal airspace opacity. No radiographically apparent displaced rib fracture. Visualized upper abdomen is unremarkable. IMPRESSION: Cardiomegaly with mild pulmonary edema and trace bilateral pleural effusions. Electronically Signed   By: Lorenza Cambridge M.D.   On: 04/16/2023 11:44   US RENAL  Result Date: 04/15/2023 CLINICAL DATA:  Acute renal failure superimposed on stage 4 chronic kidney disease EXAM: RENAL / URINARY TRACT ULTRASOUND COMPLETE COMPARISON:  04/20/2022 FINDINGS: Right Kidney: Renal measurements: 10.9 x 4.3 x 6.4 cm = volume: 158 mL. Mild diffuse renal cortical atrophy. Cortical echogenicity is mildly diffusely increased in keeping with changes of underlying medical renal disease. No hydronephrosis. No intrarenal masses or calcifications. No perinephric fluid collections. Left Kidney: Renal measurements: 11.5 x 6.2 x 6.1 cm = volume: 228 mL. Preserved renal cortical thickness. Renal cortical echogenicity is normal. Slightly limited evaluation of the lower pole the left kidney due to overlying osseous structures. No hydronephrosis. No intrarenal masses or calcifications. No perinephric fluid collections are seen. Bladder: The bladder wall appears thickened and trabeculated circumferentially. No intraluminal masses or calcifications are seen. Other: Mild splenomegaly is noted with the spleen measuring up to 13.5 cm in greatest dimension. IMPRESSION: 1. Mild asymmetric right renal cortical atrophy. 2. Mild splenomegaly. 3. Thickened and trabeculated bladder wall, possibly reflecting changes of chronic bladder outlet obstruction. The bladder is not distended. Electronically Signed   By: Helyn Numbers M.D.   On: 04/15/2023 17:41    ECHOCARDIOGRAM COMPLETE  Result Date: 04/12/2023    ECHOCARDIOGRAM REPORT   Patient Name:   [name] Date of Exam: 04/12/2023 Medical Rec #:  [id number]       Height:       69.0 in Accession #:    [id number]      Weight:       246.0 lb Date of Birth:  Dec 20, 1970      BSA:          2.256 m Patient Age:    52 years        BP:           138/71 mmHg Patient Gender: F               HR:           80 bpm. Exam Location:  Inpatient Procedure: 2D Echo, Color Doppler and Cardiac Doppler Indications:  microbiology, ancillary and laboratory) are listed below for reference.    Significant Diagnostic Studies: DG Chest Port 1 View  Result Date: 04/20/2023 CLINICAL DATA:  Status post dialysis catheter placement. EXAM: PORTABLE CHEST 1 VIEW COMPARISON:  Chest radiograph dated 04/16/2023. FINDINGS: Left IJ dialysis catheter with tip over the right atrium close to the cavoatrial junction. There is cardiomegaly with vascular congestion. No focal consolidation, pleural effusion, or pneumothorax. No acute osseous pathology. IMPRESSION: Left IJ dialysis catheter with tip over the right atrium close to the cavoatrial junction. No pneumothorax. Electronically Signed   By: Elgie Collard M.D.   On: 04/20/2023 17:36   PERIPHERAL VASCULAR CATHETERIZATION  Result Date: 04/20/2023 See surgical note for result.  HYBRID OR IMAGING ([facility] ONLY)  Result Date: 04/19/2023 There is no interpretation for this exam.  This order is for images obtained during a surgical procedure.  Please See "Surgeries" Tab for more  information regarding the procedure.   DG CHEST PORT 1 VIEW  Result Date: 04/16/2023 CLINICAL DATA:  Increased shortness of breath EXAM: PORTABLE CHEST 1 VIEW COMPARISON:  CXR 04/12/23 FINDINGS: Cardiomegaly.Trace bilateral pleural effusions. Mild pulmonary edema. No focal airspace opacity. No radiographically apparent displaced rib fracture. Visualized upper abdomen is unremarkable. IMPRESSION: Cardiomegaly with mild pulmonary edema and trace bilateral pleural effusions. Electronically Signed   By: Lorenza Cambridge M.D.   On: 04/16/2023 11:44   US RENAL  Result Date: 04/15/2023 CLINICAL DATA:  Acute renal failure superimposed on stage 4 chronic kidney disease EXAM: RENAL / URINARY TRACT ULTRASOUND COMPLETE COMPARISON:  04/20/2022 FINDINGS: Right Kidney: Renal measurements: 10.9 x 4.3 x 6.4 cm = volume: 158 mL. Mild diffuse renal cortical atrophy. Cortical echogenicity is mildly diffusely increased in keeping with changes of underlying medical renal disease. No hydronephrosis. No intrarenal masses or calcifications. No perinephric fluid collections. Left Kidney: Renal measurements: 11.5 x 6.2 x 6.1 cm = volume: 228 mL. Preserved renal cortical thickness. Renal cortical echogenicity is normal. Slightly limited evaluation of the lower pole the left kidney due to overlying osseous structures. No hydronephrosis. No intrarenal masses or calcifications. No perinephric fluid collections are seen. Bladder: The bladder wall appears thickened and trabeculated circumferentially. No intraluminal masses or calcifications are seen. Other: Mild splenomegaly is noted with the spleen measuring up to 13.5 cm in greatest dimension. IMPRESSION: 1. Mild asymmetric right renal cortical atrophy. 2. Mild splenomegaly. 3. Thickened and trabeculated bladder wall, possibly reflecting changes of chronic bladder outlet obstruction. The bladder is not distended. Electronically Signed   By: Helyn Numbers M.D.   On: 04/15/2023 17:41    ECHOCARDIOGRAM COMPLETE  Result Date: 04/12/2023    ECHOCARDIOGRAM REPORT   Patient Name:   [name] Date of Exam: 04/12/2023 Medical Rec #:  [id number]       Height:       69.0 in Accession #:    [id number]      Weight:       246.0 lb Date of Birth:  Dec 20, 1970      BSA:          2.256 m Patient Age:    52 years        BP:           138/71 mmHg Patient Gender: F               HR:           80 bpm. Exam Location:  Inpatient Procedure: 2D Echo, Color Doppler and Cardiac Doppler Indications:  Physician Discharge Summary  [name] GNF:[id number] DOB: 25-Oct-1970 DOA: 04/12/2023  PCP: Jackie Plum, MD  Admit date: 04/12/2023 Discharge date: 04/22/2023  Time spent: 45 minutes  Recommendations for Outpatient Follow-up:  Outpatient dialysis at [facility] TTS, 6:45 AM chair time to start on Tuesday 9/17 PCP in 1 week   Discharge Diagnoses:  ESRD, new on hemodialysis TTS   Acute on chronic diastolic CHF (congestive heart failure) (HCC) Anemia of chronic disease   CKD (chronic kidney disease), stage IV (HCC)   Essential hypertension, benign   Type 2 diabetes mellitus with hyperlipidemia (HCC)   H/O: stroke   Obstructive sleep apnea   Class 2 obesity   Discharge Condition: Improved  Diet recommendation: Renal, diabetic  Filed Weights   04/21/23 1130 04/21/23 1336 04/22/23 0430  Weight: 113.2 kg 111.5 kg 111.9 kg    History of present illness:  52 yo female with the past medical history of hypertension, dyslipidemia, heart failure, CKD stage IV, and obesity class 2. Recent AV fistula creation for HD on 04/04/23 presented w/progressive worsening dyspnea, palpitations, lightheadedness, and headaches. In ED, bun 46 cr 4,58, BNP 494, hgb 8,2 CXR w/cardiomegaly, bilateral hilar vascular congestion with bilateral symmetric central interstitial infiltrates, small bilateral pleural effusions.  - placed on IV furosemide for diuresis.  -9/07 feeling better after IV furosemide, but serum cr has been rising. Consulted nephrology.  -9/08 resumed IV furosemide. -9/09 increased diuresis, serum cr still not at baseline.  -9/10 volume status more stable but continue to rise serum cr. -9/12 vascular surgery consulted, tunneled dialysis catheter placed and started HD  Hospital Course:   Acute on chronic diastolic CHF  -Echo EF 60 to 65%, severe LVH, RV with preserved systolic function. -Continue Imdur, hydralazine, labetalol -Diuresed with IV Lasix, now started on dialysis,  see discussion below   AKI on CKD 4 Hypokalemia. Hypomagnesemia  -Left arm aVF placed 2 weeks ago -Nephrology consulted, volume status improving, diuresed with IV Lasix at high doses followed by torsemide, metolazone, -Creatinine trended up to 7.7, nephrology was following, recommended initiating dialysis, left IJ HD catheter placed, started HD 9/12 -She has been cleared for outpatient dialysis to start on Tuesday 9/17, she will discharge home after HD today   Metabolic bone disease, continue with phosphate binders.  Continue with calcitriol, sodium bicarbonate.    Anemia of chronic disease, with iron deficiency. Serum iron 50, TIBC 309, transferrin saturation 16 and ferritin 249  -Given IV iron and EPO -Stable   Essential hypertension, benign -Improving, discontinue amlodipine continue labetalol, bidil   Type 2 diabetes mellitus with hyperlipidemia (HCC) Continue with low dose of basal insulin 70/30: 10 units bid.   H/O: stroke -Stable condition, continue aspirin and statin   Obstructive sleep apnea Cpap   Class 2 obesity Calculated BMI 36.2       Discharge Exam: Vitals:   04/22/23 0021 04/22/23 0430  BP: (!) 127/55 (!) 143/69  Pulse: 75 74  Resp: 20 17  Temp: 98.7 F (37.1 C) 98.8 F (37.1 C)  SpO2: 92% 92%   Gen: Awake, Alert, Oriented X 3,  HEENT: no JVD, left IJ HD catheter noted Lungs: Good air movement bilaterally, CTAB CVS: S1S2/RRR Abd: soft, Non tender, non distended, BS present Extremities: No edema Skin: no new rashes on exposed skin   Discharge Instructions   Discharge Instructions     Diet - low sodium heart healthy   Complete by: As directed    Diet Carb Modified  microbiology, ancillary and laboratory) are listed below for reference.    Significant Diagnostic Studies: DG Chest Port 1 View  Result Date: 04/20/2023 CLINICAL DATA:  Status post dialysis catheter placement. EXAM: PORTABLE CHEST 1 VIEW COMPARISON:  Chest radiograph dated 04/16/2023. FINDINGS: Left IJ dialysis catheter with tip over the right atrium close to the cavoatrial junction. There is cardiomegaly with vascular congestion. No focal consolidation, pleural effusion, or pneumothorax. No acute osseous pathology. IMPRESSION: Left IJ dialysis catheter with tip over the right atrium close to the cavoatrial junction. No pneumothorax. Electronically Signed   By: Elgie Collard M.D.   On: 04/20/2023 17:36   PERIPHERAL VASCULAR CATHETERIZATION  Result Date: 04/20/2023 See surgical note for result.  HYBRID OR IMAGING ([facility] ONLY)  Result Date: 04/19/2023 There is no interpretation for this exam.  This order is for images obtained during a surgical procedure.  Please See "Surgeries" Tab for more  information regarding the procedure.   DG CHEST PORT 1 VIEW  Result Date: 04/16/2023 CLINICAL DATA:  Increased shortness of breath EXAM: PORTABLE CHEST 1 VIEW COMPARISON:  CXR 04/12/23 FINDINGS: Cardiomegaly.Trace bilateral pleural effusions. Mild pulmonary edema. No focal airspace opacity. No radiographically apparent displaced rib fracture. Visualized upper abdomen is unremarkable. IMPRESSION: Cardiomegaly with mild pulmonary edema and trace bilateral pleural effusions. Electronically Signed   By: Lorenza Cambridge M.D.   On: 04/16/2023 11:44   US RENAL  Result Date: 04/15/2023 CLINICAL DATA:  Acute renal failure superimposed on stage 4 chronic kidney disease EXAM: RENAL / URINARY TRACT ULTRASOUND COMPLETE COMPARISON:  04/20/2022 FINDINGS: Right Kidney: Renal measurements: 10.9 x 4.3 x 6.4 cm = volume: 158 mL. Mild diffuse renal cortical atrophy. Cortical echogenicity is mildly diffusely increased in keeping with changes of underlying medical renal disease. No hydronephrosis. No intrarenal masses or calcifications. No perinephric fluid collections. Left Kidney: Renal measurements: 11.5 x 6.2 x 6.1 cm = volume: 228 mL. Preserved renal cortical thickness. Renal cortical echogenicity is normal. Slightly limited evaluation of the lower pole the left kidney due to overlying osseous structures. No hydronephrosis. No intrarenal masses or calcifications. No perinephric fluid collections are seen. Bladder: The bladder wall appears thickened and trabeculated circumferentially. No intraluminal masses or calcifications are seen. Other: Mild splenomegaly is noted with the spleen measuring up to 13.5 cm in greatest dimension. IMPRESSION: 1. Mild asymmetric right renal cortical atrophy. 2. Mild splenomegaly. 3. Thickened and trabeculated bladder wall, possibly reflecting changes of chronic bladder outlet obstruction. The bladder is not distended. Electronically Signed   By: Helyn Numbers M.D.   On: 04/15/2023 17:41    ECHOCARDIOGRAM COMPLETE  Result Date: 04/12/2023    ECHOCARDIOGRAM REPORT   Patient Name:   [name] Date of Exam: 04/12/2023 Medical Rec #:  [id number]       Height:       69.0 in Accession #:    [id number]      Weight:       246.0 lb Date of Birth:  Dec 20, 1970      BSA:          2.256 m Patient Age:    52 years        BP:           138/71 mmHg Patient Gender: F               HR:           80 bpm. Exam Location:  Inpatient Procedure: 2D Echo, Color Doppler and Cardiac Doppler Indications:

## 2023-04-22 NOTE — Progress Notes (Signed)
Mobility Specialist Progress Note:   04/22/23 1000  Mobility  Activity Ambulated with assistance in hallway  Level of Assistance Standby assist, set-up cues, supervision of patient - no hands on  Assistive Device [place];Other (Comment) (hallway handrails)  Distance Ambulated (ft) 300 ft  Activity Response Tolerated well  Mobility Referral Yes  $Mobility charge 1 Mobility  Mobility Specialist Start Time (ACUTE ONLY) 1000  Mobility Specialist Stop Time (ACUTE ONLY) 1015  Mobility Specialist Time Calculation (min) (ACUTE ONLY) 15 min   Pt agreeable to mobility session. Required no physical assistance, only supervision for safety. Pt denies SOB with ambulation. Back in bed with all needs met.   Addison Lank Mobility Specialist Please contact via SecureChat or  Rehab office at [phone]

## 2023-04-22 NOTE — Progress Notes (Signed)
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 Had R arm AVF placed 1 week prior in prep for suspected ESRD soon. Pt was admitted for SOB/ pulm edema/ DOE.  She's diuresed some but required high dose diuretics and still didn't feel well with early uremic symptoms. Left IJ TDC placed by [facility] on 9/12 and received first dialysis.  Second HD on 9/13 with 1 L UF, tolerated well. Plan for third dialysis today and possibly discharge outpatient.  #Acute/ chronic diast CHF - as above, vol overload w/ SOB much improved, UF with HD.  Discontinue diuretics.  #HTN - lowered Bidil and norvasc dosing, also getting labetalol bid. BP's are stable today.   # Anemia: Hb into 7s, with iron deficiency.  IV iron and ESA.   # Hypokalemia: Managing with dialysis.  # CKD-MBD: Continue PhosLo for hyperphosphatemia.  On calcitriol.  # Disposition:at [facility] on TTS 6:45 am chair time. Pt can start on Tuesday and will need to arrive at 6:10 am to complete paperwork prior treatment.   Ok to discharge after dialysis today.  Discussed with the patient and the primary team.  Subjective: Seen and examined bedside.  She feels great this morning.  Denies nausea, vomiting, chest pain, shortness of breath.  Objective Vital signs in last 24 hours: Vitals:   04/21/23 1631 04/21/23 1954 04/22/23 0021 04/22/23 0430  BP: (!) 138/59 128/63 (!) 127/55 (!) 143/69  Pulse: 78 76 75 74  Resp:  17 20 17   Temp: 98 F (36.7 C) 98.7 F (37.1 C) 98.7 F (37.1 C) 98.8 F (37.1 C)  TempSrc: Axillary Oral Oral Oral  SpO2:  95% 92% 92%  Weight:    111.9 kg  Height:       Weight change: 1.3 kg  Intake/Output Summary (Last 24 hours) at 04/22/2023 1008 Last data filed at 04/22/2023 0600 Gross per 24 hour  Intake 240 ml  Output 1200 ml  Net -960 ml       Labs: RENAL PANEL Recent Labs  Lab  04/16/23 0254 04/17/23 0250 04/18/23 0311 04/19/23 0401 04/20/23 0315 04/20/23 1109 04/21/23 0342 04/22/23 0250  NA 135 136 134* 137 134* 134* 133* 135  K 3.7 3.7 3.5 3.5 3.2* 3.7 3.6 3.8  CL 103 102 99 96* 96* 99 95* 96*  CO2 19* 23 21* 25 24  --  24 27  GLUCOSE 142* 138* 126* 131* 143* 143* 139* 153*  BUN 48* 52* 60* 70* 80* 81* 60* 52*  CREATININE 5.97* 5.87* 6.20* 6.53* 6.96* 7.70* 5.44* 4.86*  CALCIUM 8.3* 8.3* 8.4* 8.6* 8.1*  --  8.2* 8.5*  MG  --   --   --  2.0  --   --   --   --   PHOS 6.1* 6.2* 6.3* 6.5*  --   --   --   --   ALBUMIN 3.0* 3.0* 3.2* 3.3*  --   --   --   --     Liver Function Tests: Recent Labs  Lab 04/17/23 0250 04/18/23 0311 04/19/23 0401  ALBUMIN 3.0* 3.2* 3.3*   No results for input(s): "LIPASE", "AMYLASE" in the last 168 hours. No results for input(s): "AMMONIA" in the last 168 hours. CBC: Recent Labs    12/02/22 1610 12/02/22 9604 01/06/23 0850 02/03/23 0957 02/03/23 1000 03/03/23 0840 03/03/23 5409 04/17/23 0250  04/17/23 0913 04/20/23 0315 04/20/23 1109 04/21/23 0342 04/22/23 0250  HGB  --    < >  --   --    < >  --    < > 7.4*  --  7.8* 9.2* 8.2* 8.0*  MCV  --   --   --   --   --   --    < > 91.6  --  93.3  --  94.4 93.0  FERRITIN 210  --  224 236  --  183  --   --  249  --   --   --   --   TIBC 361  --  281 309  --  302  --   --  309  --   --   --   --   IRON 68  --  22* 44  --  56  --   --  50  --   --   --   --    < > = values in this interval not displayed.    Cardiac Enzymes: No results for input(s): "CKTOTAL", "CKMB", "CKMBINDEX", "TROPONINI" in the last 168 hours. CBG: Recent Labs  Lab 04/20/23 2117 04/21/23 0631 04/21/23 1629 04/21/23 2109 04/22/23 0601  GLUCAP 135* 124* 190* 182* 138*    Iron Studies: No results for input(s): "IRON", "TIBC", "TRANSFERRIN", "FERRITIN" in the last 72 hours. Studies/Results: DG Chest Port 1 View  Result Date: 04/20/2023 CLINICAL DATA:  Status post dialysis catheter placement.  EXAM: PORTABLE CHEST 1 VIEW COMPARISON:  Chest radiograph dated 04/16/2023. FINDINGS: Left IJ dialysis catheter with tip over the right atrium close to the cavoatrial junction. There is cardiomegaly with vascular congestion. No focal consolidation, pleural effusion, or pneumothorax. No acute osseous pathology. IMPRESSION: Left IJ dialysis catheter with tip over the right atrium close to the cavoatrial junction. No pneumothorax. Electronically Signed   By: Elgie Collard M.D.   On: 04/20/2023 17:36   PERIPHERAL VASCULAR CATHETERIZATION  Result Date: 04/20/2023 See surgical note for result.   Medications: Infusions:   Scheduled Medications:  aspirin EC  81 mg Oral Daily   calcitRIOL  0.25 mcg Oral Daily   calcium acetate  667 mg Oral TID WC   Chlorhexidine Gluconate Cloth  6 each Topical Q0600   Chlorhexidine Gluconate Cloth  6 each Topical Q0600   darbepoetin (ARANESP) injection - NON-DIALYSIS  100 mcg Subcutaneous Q Tue-1800   heparin  5,000 Units Subcutaneous Q8H   insulin aspart  0-5 Units Subcutaneous QHS   insulin aspart  0-9 Units Subcutaneous TID WC   insulin aspart protamine- aspart  10 Units Subcutaneous BID WC   isosorbide-hydrALAZINE  1 tablet Oral TID   labetalol  300 mg Oral BID   polyethylene glycol  17 g Oral BID   rosuvastatin  40 mg Oral q AM   sodium chloride flush  3 mL Intravenous Q12H    have reviewed scheduled and prn medications.  Physical Exam: General:NAD, comfortable Heart:RRR, s1s2 nl Lungs:clear b/l, no crackle Abdomen:soft, Non-tender, non-distended Extremities: Trace peripheral edema Dialysis Access: Left IJ TDC placed by [facility].  Irena Gaydos Jaynie Collins 04/22/2023,10:08 AM  LOS: 10 days

## 2023-04-23 ENCOUNTER — Telehealth (HOSPITAL_COMMUNITY): Payer: Self-pay | Admitting: Nephrology

## 2023-04-23 NOTE — Telephone Encounter (Signed)
Transition of Care - Initial Contact from Inpatient Facility  Date of discharge: 04/22/23 Date of contact: 04/23/23  Method: Phone Spoke to: Patient  Patient contacted to discuss transition of care from recent inpatient hospitalization. Patient was admitted to [facility] from 9/4 - 04/22/23 with discharge diagnosis of uremia, overload -> new start ESRD  The discharge medication list was reviewed. Patient understands the changes and has no concerns.   Patient will return to his/her outpatient HD unit on: Tuesday. She is aware where to go and that she needs to call tomorrow to arrange transportation.  No other concerns at this time.  Ozzie Hoyle, PA-C [employer] Pager [phone]

## 2023-04-24 NOTE — Progress Notes (Signed)
Late Note Entry- April 24, 2023  Pt was d/c on Saturday. Contacted [facility] this morning to advise clinic of pt's d/c date and that pt should start tomorrow as planned.   Olivia Canter Renal Navigator [phone]

## 2023-04-25 DIAGNOSIS — N2581 Secondary hyperparathyroidism of renal origin: Secondary | ICD-10-CM | POA: Insufficient documentation

## 2023-05-01 ENCOUNTER — Encounter: Payer: Self-pay | Admitting: Podiatry

## 2023-05-04 ENCOUNTER — Other Ambulatory Visit: Payer: Self-pay | Admitting: *Deleted

## 2023-05-04 DIAGNOSIS — N186 End stage renal disease: Secondary | ICD-10-CM

## 2023-05-11 ENCOUNTER — Encounter (HOSPITAL_COMMUNITY): Payer: Medicaid Other

## 2023-05-12 ENCOUNTER — Encounter (HOSPITAL_COMMUNITY): Payer: Medicaid Other

## 2023-06-02 ENCOUNTER — Ambulatory Visit (HOSPITAL_COMMUNITY): Payer: Medicaid Other | Attending: Vascular Surgery

## 2023-06-21 ENCOUNTER — Ambulatory Visit: Payer: Medicaid Other | Admitting: Podiatry

## 2023-06-21 DIAGNOSIS — B351 Tinea unguium: Secondary | ICD-10-CM | POA: Diagnosis not present

## 2023-06-21 DIAGNOSIS — M79675 Pain in left toe(s): Secondary | ICD-10-CM | POA: Diagnosis not present

## 2023-06-21 DIAGNOSIS — M79674 Pain in right toe(s): Secondary | ICD-10-CM | POA: Diagnosis not present

## 2023-06-21 DIAGNOSIS — Z794 Long term (current) use of insulin: Secondary | ICD-10-CM

## 2023-06-21 DIAGNOSIS — E1142 Type 2 diabetes mellitus with diabetic polyneuropathy: Secondary | ICD-10-CM

## 2023-06-21 NOTE — Progress Notes (Signed)
  Subjective:  Patient ID: [name], female    DOB: [birth year],   MRN: [id number]  Chief Complaint  Patient presents with   Nail Problem    RFC.    52 y.o. female presents for concern of thickened elongated and painful nails that are difficult to trim. Requesting to have them trimmed today. Relates burning and tingling in their feet. Patient is diabetic and last A1c was  Lab Results  Component Value Date   HGBA1C 7.4 (A) 03/28/2023   .   PCP:  Jackie Plum, MD    . Denies any other pedal complaints. Denies n/v/f/c.   Past Medical History:  Diagnosis Date   Anemia    Anxiety    Arthritis    Asthma    Cataract    Mixed form OU   CKD (chronic kidney disease)    Coronary artery disease    Depression    Diabetes mellitus    Diabetic retinopathy (HCC)    NPDR OU   Dyspnea    Hyperlipidemia    Hypertension    Hypertensive retinopathy    OU   Left thyroid nodule    diagnosed 07/2018   PAC (premature atrial contraction) 02/15/2021   Pseudotumor cerebri    Sleep apnea    Uses a cpap   Stroke (HCC)    Vitamin D deficiency     Objective:  Physical Exam: Vascular: DP/PT pulses 2/4 bilateral. CFT <3 seconds. Absent hair growth on digits. Edema noted to bilateral lower extremities. Xerosis noted bilaterally.  Skin. No lacerations or abrasions bilateral feet. Nails 1-5 bilateral  are thickened discolored and elongated with subungual debris.  Musculoskeletal: MMT 5/5 bilateral lower extremities in DF, PF, Inversion and Eversion. Deceased ROM in DF of ankle joint. 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  Neurological: Sensation intact to light touch. Protective sensation diminished bilateral.    Assessment:   1. Pain due to onychomycosis of toenails of both feet   2. Type 2 diabetes mellitus with diabetic polyneuropathy, with long-term current use of insulin (HCC)      Plan:  Patient was evaluated and treated and all questions  answered. -Discussed and educated patient on diabetic foot care, especially with  regards to the vascular, neurological and musculoskeletal systems.  -Stressed the importance of good glycemic control and the detriment of not  controlling glucose levels in relation to the foot. -Discussed supportive shoes at all times and checking feet regularly.  -Mechanically debrided all nails 1-5 bilateral using sterile nail nipper and filed with dremel without incident  Awating DM shoes -Answered all patient questions -Patient to return  in 3 months for at risk foot care -Patient advised to call the office if any problems or questions arise in the meantime.   Louann Sjogren, DPM

## 2023-06-28 ENCOUNTER — Ambulatory Visit (HOSPITAL_COMMUNITY)
Admission: RE | Admit: 2023-06-28 | Discharge: 2023-06-28 | Disposition: A | Discharge status: Home / Self Care | Payer: Medicaid Other | Source: Ambulatory Visit | Attending: Vascular Surgery | Admitting: Vascular Surgery

## 2023-06-28 DIAGNOSIS — N186 End stage renal disease: Secondary | ICD-10-CM | POA: Insufficient documentation

## 2023-06-29 ENCOUNTER — Ambulatory Visit: Payer: Medicaid Other | Admitting: Physician Assistant

## 2023-06-29 VITALS — BP 118/69 | HR 86 | Temp 98.0°F | Ht 69.0 in | Wt 232.3 lb

## 2023-06-29 DIAGNOSIS — N186 End stage renal disease: Secondary | ICD-10-CM

## 2023-06-29 NOTE — Progress Notes (Signed)
    Postoperative Access Visit   History of Present Illness   [name] is a 52 y.o. year old female who presents for postoperative follow-up for: right brachiocephalic arteriovenous fistula (Date: 04/04/23).  The patient's wounds are  healed.  The patient denies steal symptoms.  The patient is able to complete their activities of daily living.  She is currently dialyzing via left IJ [facility] on a Tuesday Thursday Saturday schedule at the [facility] location.   Physical Examination   Vitals:   06/29/23 1411  BP: 118/69  Pulse: 86  Temp: 98 F (36.7 C)  TempSrc: Temporal  SpO2: 96%  Weight: 232 lb 4.8 oz (105.4 kg)  Height: 5\' 9"  (1.753 m)   Body mass index is 34.3 kg/m.  right arm Incision has healed, palpable radial pulse, hand grip is 5/5, sensation in digits is intact, palpable thrill, bruit can be auscultated     Medical Decision Making   [name] is a 52 y.o. year old female who presents s/p right brachiocephalic arteriovenous fistula  Patent brachiocephalic fistula without signs or symptoms of steal syndrome The patient's access will be ready for use 07/11/23 The patient's tunneled dialysis catheter can be removed when Nephrology is comfortable with the performance of the fistula The patient may follow up on a prn basis   Emilie Rutter PA-C [facility] Office: [phone]  Clinic MD: Karin Lieu

## 2023-07-11 ENCOUNTER — Ambulatory Visit: Payer: Medicaid Other | Admitting: "Endocrinology

## 2023-07-12 ENCOUNTER — Other Ambulatory Visit (HOSPITAL_COMMUNITY): Payer: Self-pay

## 2023-07-12 ENCOUNTER — Telehealth: Payer: Self-pay

## 2023-07-12 NOTE — Telephone Encounter (Signed)
Pharmacy Patient Advocate Encounter   Received notification from CoverMyMeds that prior authorization for Dexcom G7 sensor is required/requested.   Insurance verification completed.   The patient is insured through [facility] .   Per test claim: PA required; PA submitted to above mentioned insurance via CoverMyMeds Key/confirmation #/EOC [id number] Status is pending

## 2023-07-14 NOTE — Telephone Encounter (Signed)
Pharmacy Patient Advocate Encounter  Received notification from [facility] that Prior Authorization for Dexcom G7 sensor has been APPROVED from 07/12/23 to 12/08/23   PA #/Case ID/Reference #: [id number]

## 2023-07-17 ENCOUNTER — Ambulatory Visit: Payer: Medicaid Other | Attending: Cardiology | Admitting: Cardiology

## 2023-07-17 ENCOUNTER — Encounter: Payer: Self-pay | Admitting: Cardiology

## 2023-07-17 VITALS — BP 153/83 | HR 87 | Resp 16 | Ht 69.0 in | Wt 239.0 lb

## 2023-07-17 DIAGNOSIS — I10 Essential (primary) hypertension: Secondary | ICD-10-CM | POA: Diagnosis not present

## 2023-07-17 MED ORDER — BLOOD PRESSURE MONITOR DEVI: 1.0000 | Freq: Every day | 0 refills | Status: AC

## 2023-07-17 NOTE — Patient Instructions (Addendum)
Medication Instructions:  Your physician recommends that you continue on your current medications as directed. Please refer to the Current Medication list given to you today.  *If you need a refill on your cardiac medications before your next appointment, please call your pharmacy*  Lab Work: None ordered today.  Testing/Procedures: None ordered today.  Follow-Up: At [facility], you and your health needs are our priority.  As part of our continuing mission to provide you with exceptional heart care, we have created designated Provider Care Teams.  These Care Teams include your primary Cardiologist (physician) and Advanced Practice Providers (APPs -  Physician Assistants and Nurse Practitioners) who all work together to provide you with the care you need, when you need it.  Your next appointment:   1 year(s)  The format for your next appointment:   In Person  Provider:   Elder Negus, MD {  Other Instructions HOW TO TAKE YOUR BLOOD PRESSURE Rest 5 minutes before taking your blood pressure. Don't  smoke or drink caffeinated beverages for at least 30 minutes before. Take your blood pressure before (not after) you eat. Sit comfortably with your back supported and both feet on the floor ( don't cross your legs). Elevate your arm to heart level on a table or a desk. Use the proper sized cuff.  It should fit smoothly and snugly around your bare upper arm. There should be enough room to slip a fingertip under the cuff.  The bottom edge of the cuff should be 1 inch above the crease of the elbow.  Please monitor your blood pressure once daily 2 hours after your am medication. If you blood pressure. Consistently remains above 140 (systolic) top number or over 90 ( diastolic) bottom number X 3 days consecutively.  Please call our office at [phone] or send Mychart message.

## 2023-07-17 NOTE — Progress Notes (Signed)
  Cardiology Office Note:  .   Date:  07/17/2023  ID:  [name], DOB Jul 24, 1971, MRN [id number] PCP: Jackie Plum, MD  [facility] Providers Cardiologist:  Truett Mainland, MD PCP: Jackie Plum, MD  Chief Complaint  Patient presents with   Resistant hypertension3   NSVT (nonsustained ventricular tachycardia)   Follow-up    6 months       History of Present Illness: .    [name] is a 52 y.o. female with  hypertension, hyperlipidemia, uncontrolled type 2 diabetes mellitus, tobacco abuse, recurrent strokes.  Patient was hospitalized in 04/2023 with exertional dyspnea, vascular congestion and pleural effusions on chest x-ray.  Her renal function continued to worsen during this hospitalization, while on aggressive diuresis necessary for her fluid overload.  Subsequently, she was initiated on dialysis after placement of tunneled dialysis catheter.  She is now doing outpatient dialysis in [facility] on Tuesday, Thursday, Saturday.  Patient has had some issues with her right upper extremity AV fistula, and is going to have a follow-up with vascular surgeons.  She is getting her dialysis through tunneled catheter in the left upper chest.  Blood pressure elevated today.  However, she tells me that blood pressure is low on standing up after dialysis.  She does not have blood pressure monitor at home, and does not check her blood pressure regularly.     Vitals:   07/17/23 0900  BP: (!) 153/83  Pulse: 87  Resp: 16  SpO2: 95%     ROS:  Review of Systems  Cardiovascular:  Negative for chest pain, dyspnea on exertion, leg swelling, palpitations and syncope.     Studies Reviewed: Marland Kitchen        Independently interpreted 04/2023: HbA1C 7.4% Hb 8 Cr 4.8 K 3,8   Physical Exam:   Physical Exam Vitals and nursing note reviewed.  Constitutional:      General: She is not in acute distress. Neck:     Vascular: No JVD.  Cardiovascular:     Rate and  Rhythm: Normal rate and regular rhythm.     Heart sounds: Normal heart sounds. No murmur heard. Pulmonary:     Effort: Pulmonary effort is normal.     Breath sounds: Normal breath sounds. No wheezing or rales.  Musculoskeletal:     Right lower leg: No edema.     Left lower leg: No edema.      VISIT DIAGNOSES:   ICD-10-CM   1. Essential hypertension, benign  I10        ASSESSMENT AND PLAN: .    [name] is a 52 y.o. female with ypertension, hyperlipidemia, uncontrolled type 2 diabetes mellitus, tobacco abuse, recurrent strokes.  Hypertension: Blood pressure slightly elevated today, but is generally low and dialysis, according to the patient.  Encourage patient to check blood pressure regularly at home with home blood pressure monitor.  No changes made to her antihypertensive therapy today.  Continue regular follow-up with nephrology.  She is going to have follow-up with vascular surgeons regarding her right upper extremity AV fistula.  Today, we gave her a paper prescription of a blood pressure monitor.  Hopefully, her insurance will pay for this.  H/o stroke: No Afib on event monitor.  Continue Aspirin, statin.   Type 2 diabetes mellitus: Continue f/u w/Dr. Fransico Him.      F/u in 1 year  Signed, Elder Negus, MD

## 2023-07-20 ENCOUNTER — Ambulatory Visit: Payer: Medicaid Other | Admitting: Cardiology

## 2023-07-20 ENCOUNTER — Encounter (HOSPITAL_COMMUNITY): Payer: Medicaid Other

## 2023-07-20 ENCOUNTER — Ambulatory Visit (INDEPENDENT_AMBULATORY_CARE_PROVIDER_SITE_OTHER): Payer: Medicaid Other

## 2023-07-20 DIAGNOSIS — M2141 Flat foot [pes planus] (acquired), right foot: Secondary | ICD-10-CM

## 2023-07-20 DIAGNOSIS — L84 Corns and callosities: Secondary | ICD-10-CM | POA: Diagnosis not present

## 2023-07-20 DIAGNOSIS — E1142 Type 2 diabetes mellitus with diabetic polyneuropathy: Secondary | ICD-10-CM

## 2023-07-20 DIAGNOSIS — Z794 Long term (current) use of insulin: Secondary | ICD-10-CM

## 2023-07-20 DIAGNOSIS — M2142 Flat foot [pes planus] (acquired), left foot: Secondary | ICD-10-CM

## 2023-07-20 DIAGNOSIS — M2041 Other hammer toe(s) (acquired), right foot: Secondary | ICD-10-CM

## 2023-07-20 DIAGNOSIS — L97524 Non-pressure chronic ulcer of other part of left foot with necrosis of bone: Secondary | ICD-10-CM

## 2023-07-20 DIAGNOSIS — M2042 Other hammer toe(s) (acquired), left foot: Secondary | ICD-10-CM

## 2023-07-20 NOTE — Progress Notes (Signed)

## 2023-07-29 ENCOUNTER — Other Ambulatory Visit: Payer: Self-pay | Admitting: "Endocrinology

## 2023-08-11 ENCOUNTER — Other Ambulatory Visit: Payer: Self-pay | Admitting: "Endocrinology

## 2023-08-16 ENCOUNTER — Ambulatory Visit (HOSPITAL_COMMUNITY)
Admission: RE | Admit: 2023-08-16 | Discharge: 2023-08-16 | Disposition: A | Discharge status: Home / Self Care | Payer: Medicaid Other | Attending: Nephrology | Admitting: Nephrology

## 2023-08-16 ENCOUNTER — Other Ambulatory Visit: Payer: Self-pay

## 2023-08-16 ENCOUNTER — Encounter (HOSPITAL_COMMUNITY)
Admission: RE | Disposition: A | Discharge status: Home / Self Care | Payer: Self-pay | Source: Home / Self Care | Attending: Nephrology

## 2023-08-16 DIAGNOSIS — Z794 Long term (current) use of insulin: Secondary | ICD-10-CM | POA: Diagnosis not present

## 2023-08-16 DIAGNOSIS — Z79899 Other long term (current) drug therapy: Secondary | ICD-10-CM | POA: Diagnosis not present

## 2023-08-16 DIAGNOSIS — T82858A Stenosis of vascular prosthetic devices, implants and grafts, initial encounter: Secondary | ICD-10-CM | POA: Insufficient documentation

## 2023-08-16 DIAGNOSIS — E785 Hyperlipidemia, unspecified: Secondary | ICD-10-CM | POA: Insufficient documentation

## 2023-08-16 DIAGNOSIS — Z87891 Personal history of nicotine dependence: Secondary | ICD-10-CM | POA: Diagnosis not present

## 2023-08-16 DIAGNOSIS — Y832 Surgical operation with anastomosis, bypass or graft as the cause of abnormal reaction of the patient, or of later complication, without mention of misadventure at the time of the procedure: Secondary | ICD-10-CM | POA: Diagnosis not present

## 2023-08-16 DIAGNOSIS — E1122 Type 2 diabetes mellitus with diabetic chronic kidney disease: Secondary | ICD-10-CM | POA: Diagnosis not present

## 2023-08-16 DIAGNOSIS — N250 Renal osteodystrophy: Secondary | ICD-10-CM | POA: Insufficient documentation

## 2023-08-16 DIAGNOSIS — N186 End stage renal disease: Secondary | ICD-10-CM | POA: Diagnosis not present

## 2023-08-16 DIAGNOSIS — I132 Hypertensive heart and chronic kidney disease with heart failure and with stage 5 chronic kidney disease, or end stage renal disease: Secondary | ICD-10-CM | POA: Insufficient documentation

## 2023-08-16 DIAGNOSIS — I5032 Chronic diastolic (congestive) heart failure: Secondary | ICD-10-CM | POA: Insufficient documentation

## 2023-08-16 DIAGNOSIS — Z7985 Long-term (current) use of injectable non-insulin antidiabetic drugs: Secondary | ICD-10-CM | POA: Insufficient documentation

## 2023-08-16 DIAGNOSIS — G4733 Obstructive sleep apnea (adult) (pediatric): Secondary | ICD-10-CM | POA: Diagnosis not present

## 2023-08-16 DIAGNOSIS — D631 Anemia in chronic kidney disease: Secondary | ICD-10-CM | POA: Diagnosis not present

## 2023-08-16 DIAGNOSIS — Z8673 Personal history of transient ischemic attack (TIA), and cerebral infarction without residual deficits: Secondary | ICD-10-CM | POA: Diagnosis not present

## 2023-08-16 HISTORY — PX: PERIPHERAL VASCULAR BALLOON ANGIOPLASTY: CATH118281

## 2023-08-16 HISTORY — PX: A/V FISTULAGRAM: CATH118298

## 2023-08-16 SURGERY — A/V FISTULAGRAM
Anesthesia: LOCAL | Laterality: Right

## 2023-08-16 MED ORDER — LIDOCAINE HCL (PF) 1 % IJ SOLN
INTRAMUSCULAR | Status: AC
  Filled 2023-08-16: qty 30

## 2023-08-16 MED ORDER — ACETAMINOPHEN 325 MG PO TABS
650.0000 mg | ORAL_TABLET | Freq: Four times a day (QID) | ORAL | Status: DC | PRN
  Administered 2023-08-16: 650 mg via ORAL

## 2023-08-16 MED ORDER — MIDAZOLAM HCL 2 MG/2ML IJ SOLN
INTRAMUSCULAR | Status: DC | PRN
  Administered 2023-08-16: 1 mg via INTRAVENOUS

## 2023-08-16 MED ORDER — FENTANYL CITRATE (PF) 100 MCG/2ML IJ SOLN
INTRAMUSCULAR | Status: DC | PRN
  Administered 2023-08-16: 25 ug via INTRAVENOUS

## 2023-08-16 MED ORDER — FENTANYL CITRATE (PF) 100 MCG/2ML IJ SOLN
INTRAMUSCULAR | Status: AC
  Filled 2023-08-16: qty 2

## 2023-08-16 MED ORDER — ACETAMINOPHEN 650 MG RE SUPP
650.0000 mg | RECTAL | Status: DC | PRN
Start: 2023-08-16 — End: 2023-08-16

## 2023-08-16 MED ORDER — IODIXANOL 320 MG/ML IV SOLN
INTRAVENOUS | Status: DC | PRN
  Administered 2023-08-16: 8 mL

## 2023-08-16 MED ORDER — ACETAMINOPHEN 325 MG PO TABS
ORAL_TABLET | ORAL | Status: AC
  Filled 2023-08-16: qty 2

## 2023-08-16 MED ORDER — HEPARIN (PORCINE) IN NACL 1000-0.9 UT/500ML-% IV SOLN
INTRAVENOUS | Status: DC | PRN
  Administered 2023-08-16: 500 mL

## 2023-08-16 MED ORDER — LIDOCAINE HCL (PF) 1 % IJ SOLN
INTRAMUSCULAR | Status: DC | PRN
  Administered 2023-08-16: 2 mL via INTRADERMAL

## 2023-08-16 MED ORDER — MIDAZOLAM HCL 2 MG/2ML IJ SOLN
INTRAMUSCULAR | Status: AC
  Filled 2023-08-16: qty 2

## 2023-08-16 SURGICAL SUPPLY — 13 items
BAG SNAP BAND KOVER 36X36 (MISCELLANEOUS) ×3 IMPLANT
BALLN MUSTANG 10.0X40 75 (BALLOONS) ×2
BALLN MUSTANG 12.0X40 75 (BALLOONS) ×2
BALLOON MUSTANG 10.0X40 75 (BALLOONS) IMPLANT
BALLOON MUSTANG 12.0X40 75 (BALLOONS) IMPLANT
COVER DOME SNAP 22 D (MISCELLANEOUS) ×3 IMPLANT
GUIDEWIRE ANGLED .035X150CM (WIRE) IMPLANT
KIT MICROPUNCTURE NIT STIFF (SHEATH) IMPLANT
SHEATH PINNACLE R/O II 6F 4CM (SHEATH) IMPLANT
SHEATH PINNACLE R/O II 7F 4CM (SHEATH) IMPLANT
SHEATH PROBE COVER 6X72 (BAG) IMPLANT
SYR MEDALLION 10ML (SYRINGE) IMPLANT
TRAY PV CATH (CUSTOM PROCEDURE TRAY) ×3 IMPLANT

## 2023-08-16 NOTE — Op Note (Signed)
 Patient presents with cannulation of her right BCF (placed April 04, 2023).  This is her first procedure here. On examination, the brachial cephalic fistula does not seem to augment well.  Evidence of a accessory vein as well; arm is mildly swollen compared to the left.  Patient has a left-sided internal jugular tunneled catheter as well..    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  2)      The body of the cephalic vein fistula, inflow, and arch were patent with good flows; a few accessory veins in the body of the fistula. 3)      The inflow was widely patent. 4)      This right BCF remains amenable to future percutaneous intervention; will refer to Dr. Lanis to assess again dialysis unit has not been able to cannulate this fistula.  It does not appear to be deep but I wonder if the fistula would augment better making cannulation easier if the accessory veins were obliterated.  Description of procedure: The arm was prepped and draped in the usual sterile fashion. The right upper arm brachial cephalic fistula was cannulated (63098) with a 21G micropuncture needle directed in an antegrade direction. A guidewire was inserted and exchanged for a 6 - 7 Fr sheath. Contrast [phone]) injection via the side port of the sheath was performed.  There was a small self-limiting type I grade 2 extravasation at that initial entry site with a 21-gauge.  There was no continued extravasation with contrast injected through the sideport of the 7 French sheath even with outflow occlusion.    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  The inflow anastomosis and SVC were patent.  The wire was advanced centrally  without any difficulty. A 10mm x4 -> 12mm x4  Mustang angioplasty balloons were inserted over a glide wire and positioned at the outflow innominate vein stenosis. Venous angioplasty (63092) was carried out to 16-18 ATM with FULL effacement of the waist on the balloon.  Repeat angiogram showed >50% residual at the site of angioplasty with no evidence of extravasation.  The flow of contrast was quicker, retrograde flow through the axillary vein.  Hemostasis: A 3-0 ethilon purse string suture was placed at the cannulation site on removal of the sheath.  Sedation: 1 mg Versed , 50mcg Fentanyl . Sedation time. 25 minutes  Contrast. 8 mL  Monitoring: Because of the patient's comorbid conditions and sedation during the procedure, continuous EKG monitoring and O2 saturation monitoring was performed throughout the procedure by the RN. There were no abnormal arrhythmias encountered.  Complications: None.   Diagnoses: I87.1 Stricture of vein  N18.6 ESRD T82.858A Stricture of access  Procedure Coding:  36901 Cannulation and angiogram of fistula 63092 Central venous angioplasty (innominate vein) V0032 Contrast  Recommendations:  1. Continue to cannulate the fistula with 15G needles.  2. Refer back for problems with flows. 3. Remove the suture next treatment.   Discharge: The patient was discharged home in stable condition. The patient was given education regarding the care of the dialysis access AVF and specific instructions in case of any problems.

## 2023-08-16 NOTE — Discharge Instructions (Signed)

## 2023-08-16 NOTE — H&P (Addendum)
 Chief Complaint: Decreased flows  Interval H&P  The patient has presented today for an angiogram/ angioplasty; patient is followed at [facility] with Dr. Melia.  Various methods of treatment have been discussed with the patient.  After consideration of risk, benefits and other options for treatment, the patient has consented to a angiogram/ angioplasty with  possible stent placement.   Risks of angiogram with potential angioplasty and stenting if needed.contrast reaction, extravasation/ bleeding, dissection, hypotension and death were explained to the patient.  The patient's history has been reviewed and the patient has been examined, no changes in status.  Stable for angiogram/angioplasty  I have reviewed the patient's chart and labs.  Questions were answered to the patient's satisfaction.  Assessment/Plan: ESRD dialyzing at [facility] regimen with last dialysis yesterday Difficult cannulation and a right brachiocephalic fistula placed by Dr. Lanis April 04, 2023; patient had a left internal jugular tunneled catheter placed on April 20, 2023.  Planning on angiogram with possibly angioplasty, certainly has outflow stenosis and hopefully the inflow is patent. Renal osteodystrophy - continue binders per home regimen. Anemia - managed with ESA's and IV iron  at dialysis center. HTN - resume home regimen. Obstructive sleep apnea Type 2 diabetes Hyperlipidemia HFpEF - compensated   HPI: CARESSA SCEARCE is an 53 y.o. female HFpEF, hypertension, type 2 diabetes, hyperlipidemia, history of stroke, obstructive sleep apnea, ESRD initiated on dialysis on dialysis September 2024 currently being dialyzed at [facility] center Tuesday and Thursday Saturdays for shift.  [name] last dialysis treatment was on Tuesday.  Patient being referred for difficult cannulation and is still using a left internal jugular tunneled catheter.  Pt denies fever, chills, nausea, vomiting, myalgias, SOB, CP.   ROS Per  HPI.  Chemistry and CBC: Creatinine  Date/Time Value Ref Range Status  12/27/2007 10:54 AM 0.54  Final   Creatinine, Ser  Date/Time Value Ref Range Status  04/22/2023 02:50 AM 4.86 (H) 0.44 - 1.00 mg/dL Final  90/86/7975 96:57 AM 5.44 (H) 0.44 - 1.00 mg/dL Final  90/87/7975 88:90 AM 7.70 (H) 0.44 - 1.00 mg/dL Final  90/87/7975 96:84 AM 6.96 (H) 0.44 - 1.00 mg/dL Final  90/88/7975 95:98 AM 6.53 (H) 0.44 - 1.00 mg/dL Final  90/89/7975 96:88 AM 6.20 (H) 0.44 - 1.00 mg/dL Final  90/90/7975 97:49 AM 5.87 (H) 0.44 - 1.00 mg/dL Final  90/91/7975 97:45 AM 5.97 (H) 0.44 - 1.00 mg/dL Final  90/92/7975 96:93 AM 5.58 (H) 0.44 - 1.00 mg/dL Final  90/93/7975 96:83 AM 5.21 (H) 0.44 - 1.00 mg/dL Final  90/94/7975 97:47 AM 4.75 (H) 0.44 - 1.00 mg/dL Final  90/95/7975 97:64 PM 4.58 (H) 0.44 - 1.00 mg/dL Final  91/72/7975 93:78 AM 5.30 (H) 0.44 - 1.00 mg/dL Final  92/73/7975 91:59 AM 4.56 (H) 0.44 - 1.00 mg/dL Final  93/71/7975 90:42 AM 5.03 (H) 0.44 - 1.00 mg/dL Final  94/68/7975 91:49 AM 5.16 (H) 0.44 - 1.00 mg/dL Final  95/73/7975 91:45 AM 4.00 (H) 0.44 - 1.00 mg/dL Final  96/79/7975 90:95 AM 3.62 (H) 0.44 - 1.00 mg/dL Final  87/77/7976 94:51 AM 5.20 (H) 0.44 - 1.00 mg/dL Final  88/77/7976 90:82 AM 4.05 (H) 0.57 - 1.00 mg/dL Final  90/83/7976 91:63 AM 3.50 (H) 0.44 - 1.00 mg/dL Final  90/84/7976 94:84 AM 3.46 (H) 0.44 - 1.00 mg/dL Final  90/85/7976 96:72 AM 4.19 (H) 0.44 - 1.00 mg/dL Final  90/86/7976 97:92 PM 4.49 (H) 0.44 - 1.00 mg/dL Final  93/91/7976 90:57 AM 2.92 (H) 0.44 - 1.00 mg/dL  Final  07/27/2021 12:42 PM 2.13 (H) 0.57 - 1.00 mg/dL Final  87/85/7977 97:96 PM 1.94 (H) 0.44 - 1.00 mg/dL Final  89/76/7977 98:60 AM 2.03 (H) 0.44 - 1.00 mg/dL Final  89/77/7977 98:49 AM 2.01 (H) 0.44 - 1.00 mg/dL Final  89/78/7977 97:68 PM 1.70 (H) 0.44 - 1.00 mg/dL Final  89/78/7977 97:80 AM 1.96 (H) 0.44 - 1.00 mg/dL Final  89/79/7977 97:41 PM 1.76 (H) 0.44 - 1.00 mg/dL Final  91/94/7977 88:64 AM 1.69  (H) 0.57 - 1.00 mg/dL Final  92/85/7977 90:67 AM 1.78 (H) 0.57 - 1.00 mg/dL Final  88/81/7978 97:54 AM 1.49 (H) 0.44 - 1.00 mg/dL Final  88/82/7978 95:48 PM 1.60 (H) 0.44 - 1.00 mg/dL Final  91/76/7978 89:40 AM 1.42 (H) 0.44 - 1.00 mg/dL Final  91/76/7978 90:99 AM 1.36 (H) 0.57 - 1.00 mg/dL Final  95/77/7978 90:67 AM 2.33 (H) 0.57 - 1.00 mg/dL Final  92/86/7979 93:99 AM 1.35 (H) 0.44 - 1.00 mg/dL Final  92/90/7979 94:48 AM 1.34 (H) 0.44 - 1.00 mg/dL Final  92/92/7979 94:62 AM 1.38 (H) 0.44 - 1.00 mg/dL Final  92/93/7979 94:83 PM 1.58 (H) 0.44 - 1.00 mg/dL Final  88/79/7983 91:97 PM 0.85 0.44 - 1.00 mg/dL Final  98/82/7986 93:54 PM 0.50 0.50 - 1.10 mg/dL Final  96/75/7987 98:80 PM 0.6 0.4 - 1.2 mg/dL Final  93/75/7990 94:54 AM 0.59  Final  01/28/2008 04:31 PM 0.45  Final  01/11/2008 08:55 PM 0.57  Final  12/25/2007 08:17 PM 0.54  Final   No results for input(s): NA, K, CL, CO2, GLUCOSE, BUN, CREATININE, CALCIUM , PHOS in the last 168 hours.  Invalid input(s): ALB No results for input(s): WBC, NEUTROABS, HGB, HCT, MCV, PLT in the last 168 hours. Liver Function Tests: No results for input(s): AST, ALT, ALKPHOS, BILITOT, PROT, ALBUMIN in the last 168 hours. No results for input(s): LIPASE, AMYLASE in the last 168 hours. No results for input(s): AMMONIA in the last 168 hours. Cardiac Enzymes: No results for input(s): CKTOTAL, CKMB, CKMBINDEX, TROPONINI in the last 168 hours. Iron  Studies: No results for input(s): IRON , TIBC, TRANSFERRIN, FERRITIN in the last 72 hours. PT/INR: @LABRCNTIP (inr:5)  Xrays/Other Studies: )No results found for this or any previous visit (from the past 48 hours). No results found.  PMH:   Past Medical History:  Diagnosis Date   Anemia    Anxiety    Arthritis    Asthma    Cataract    Mixed form OU   CKD (chronic kidney disease)    Coronary artery disease    Depression    Diabetes  mellitus    Diabetic retinopathy (HCC)    NPDR OU   Dyspnea    Hyperlipidemia    Hypertension    Hypertensive retinopathy    OU   Left thyroid  nodule    diagnosed 07/2018   PAC (premature atrial contraction) 02/15/2021   Pseudotumor cerebri    Sleep apnea    Uses a cpap   Stroke (HCC)    Vitamin D deficiency     PSH:   Past Surgical History:  Procedure Laterality Date   ACHILLES TENDON REPAIR Right    ACHILLES TENDON SURGERY Left 02/19/2021   Procedure: ACHILLES TENDON REPAIR WITH GRAFT;  Surgeon: Tobie Franky SQUIBB, DPM;  Location: [facility];  Service: Podiatry;  Laterality: Left;  BLOCK   AMPUTATION TOE Right 05/28/2021   Procedure: AMPUTATION TOE;  Surgeon: Joya Stabs, MD;  Location: [facility] OR;  Service: Podiatry;  Laterality: Right;  Surgical team will do block   AV FISTULA PLACEMENT Left 07/29/2022   Procedure: LEFT ARTERIOVENOUS (AV) FISTULA CREATION;  Surgeon: Sheree Penne Bruckner, MD;  Location: [facility] OR;  Service: Vascular;  Laterality: Left;   AV FISTULA PLACEMENT Right 04/04/2023   Procedure: RIGHT ARM BRACHIOCEPHALIC ARTERIOVENOUS (AV) FISTULA CREATION;  Surgeon: Lanis Fonda BRAVO, MD;  Location: [facility] OR;  Service: Vascular;  Laterality: Right;  With regional block   GASTROC RECESSION EXTREMITY Left 02/19/2021   Procedure: GASTROC RECESSION EXTREMITY;  Surgeon: Tobie Franky SQUIBB, DPM;  Location: [facility];  Service: Podiatry;  Laterality: Left;  Block   INSERTION OF DIALYSIS CATHETER Left 04/20/2023   Procedure: INSERTION OFTUNNELED  DIALYSIS CATHETER;  Surgeon: Lanis Fonda BRAVO, MD;  Location: [facility] OR;  Service: Vascular;  Laterality: Left;   TOE SURGERY Left 01/2023   TUBAL LIGATION      Allergies:  Allergies  Allergen Reactions   Bee Pollen Other (See Comments)    Seasonal Allergies   Dust Mite Extract Cough    Sneezing & Cough   Mixed Ragweed Itching    Medications:   Prior to Admission medications   Medication Sig Start Date End  Date Taking? Authorizing Provider  albuterol  (VENTOLIN  HFA) 108 (90 Base) MCG/ACT inhaler Inhale 1-2 puffs into the lungs every 6 (six) hours as needed for wheezing or shortness of breath (Asthma).   Yes [provider]  aspirin  81 MG EC tablet Take 1 tablet (81 mg total) by mouth daily. 10/14/20  Yes Patwardhan, Manish J, MD  calcitRIOL  (ROCALTROL ) 0.25 MCG capsule Take 0.25 mcg by mouth daily. 10/07/22  Yes [provider]  calcium  acetate (PHOSLO ) 667 MG capsule Take 1 capsule (667 mg total) by mouth 3 (three) times daily with meals. Patient taking differently: Take 1,334 mg by mouth 3 (three) times daily with meals. 04/22/23  Yes Fairy Frames, MD  doxycycline  (VIBRA -TABS) 100 MG tablet Take 100 mg by mouth 2 (two) times daily. 08/07/23  Yes [provider]  furosemide  (LASIX ) 20 MG tablet Take 20 mg by mouth daily.   Yes [provider]  insulin  aspart protamine - aspart (NOVOLOG  70/30 MIX) (70-30) 100 UNIT/ML FlexPen Inject 10 Units into the skin 2 (two) times daily with a meal. Patient taking differently: Inject 20 Units into the skin 2 (two) times daily with a meal. 04/22/23  Yes Fairy Frames, MD  isosorbide -hydrALAZINE  (BIDIL ) 20-37.5 MG tablet Take 1 tablet by mouth 3 (three) times daily. 04/23/22  Yes Gonfa, Taye T, MD  labetalol  (NORMODYNE ) 300 MG tablet Take 300 mg by mouth 2 (two) times daily. 08/16/21  Yes [provider]  multivitamin (RENA-VIT) TABS tablet Take 1 tablet by mouth daily.   Yes [provider]  rosuvastatin  (CRESTOR ) 40 MG tablet Take 40 mg by mouth in the morning.   Yes [provider]  Semaglutide ,0.25 or 0.5MG /DOS, (OZEMPIC , 0.25 OR 0.5 MG/DOSE,) 2 MG/3ML SOPN INJECT 0.25MG  INTO THE SKIN ONE TIME PER WEEK 07/31/23  Yes Nida, Ethelle ORN, MD  Blood Pressure Monitor DEVI 1 Device by Does not apply route daily. 07/17/23   Patwardhan, Newman PARAS, MD  Continuous Blood Gluc Receiver (DEXCOM G7 RECEIVER) DEVI Use  to monitor BG continuously 04/13/22   Nida, Gebreselassie W, MD  Continuous Glucose Sensor (DEXCOM G7 SENSOR) MISC CHANGE SENSOR EVERY 10 DAYS 08/11/23   Nida, Gebreselassie W, MD  Insulin  Pen Needle 32G X 4 MM MISC Use to inject insulin  twice daily 01/23/23  Nida, Gebreselassie W, MD  Methoxy PEG-Epoetin  Beta (MIRCERA IJ) Mircera 07/18/23 07/16/24  [provider]    Discontinued Meds:  There are no discontinued medications.  Social History:  reports that she quit smoking about 8 years ago. Her smoking use included cigarettes. She started smoking about 38 years ago. She has a 15 pack-year smoking history. She has been exposed to tobacco smoke. She has never used smokeless tobacco. She reports that she does not currently use alcohol. She reports that she does not currently use drugs after having used the following drugs: Marijuana.  Family History:   Family History  Problem Relation Age of Onset   Asthma Mother    Diabetes Father    Hypertension Father    Kidney disease Father    Heart attack Father    Heart failure Father    Hypertension Sister    Asthma Sister    Congestive Heart Failure Maternal Aunt    Diabetes Maternal Grandmother    Congestive Heart Failure Maternal Grandmother    Lung disease Maternal Grandmother    Asthma Other    Hyperlipidemia Other    Hypertension Other    Cancer Other     Blood pressure (!) 145/77, pulse 79, resp. rate 14, weight 108.9 kg, last menstrual period 12/21/2021, SpO2 97%. General:NAD, comfortable Heart:RRR, s1s2 nl Lungs:clear b/l, no crackle Abdomen:soft, Non-tender, non-distended Extremities: Trace peripheral edema Dialysis Access: Left IJ TDC, right brachiocephalic fistula pulsatile towards the outflow       Sommer Spickard, LYNWOOD ORN, MD 08/16/2023, 11:59 AM

## 2023-08-17 ENCOUNTER — Encounter (HOSPITAL_COMMUNITY): Payer: Self-pay | Admitting: Nephrology

## 2023-08-27 ENCOUNTER — Emergency Department (HOSPITAL_COMMUNITY)
Admission: EM | Admit: 2023-08-27 | Discharge: 2023-08-27 | Disposition: A | Discharge status: Home / Self Care | Payer: No Typology Code available for payment source | Attending: Emergency Medicine | Admitting: Emergency Medicine

## 2023-08-27 ENCOUNTER — Emergency Department (HOSPITAL_COMMUNITY): Payer: No Typology Code available for payment source

## 2023-08-27 ENCOUNTER — Encounter (HOSPITAL_COMMUNITY): Payer: Self-pay

## 2023-08-27 ENCOUNTER — Other Ambulatory Visit: Payer: Self-pay

## 2023-08-27 DIAGNOSIS — S39012A Strain of muscle, fascia and tendon of lower back, initial encounter: Secondary | ICD-10-CM | POA: Insufficient documentation

## 2023-08-27 DIAGNOSIS — S8002XA Contusion of left knee, initial encounter: Secondary | ICD-10-CM | POA: Insufficient documentation

## 2023-08-27 DIAGNOSIS — Z7982 Long term (current) use of aspirin: Secondary | ICD-10-CM | POA: Diagnosis not present

## 2023-08-27 DIAGNOSIS — S3992XA Unspecified injury of lower back, initial encounter: Secondary | ICD-10-CM | POA: Diagnosis present

## 2023-08-27 DIAGNOSIS — R0789 Other chest pain: Secondary | ICD-10-CM | POA: Insufficient documentation

## 2023-08-27 DIAGNOSIS — Y92410 Unspecified street and highway as the place of occurrence of the external cause: Secondary | ICD-10-CM | POA: Diagnosis not present

## 2023-08-27 MED ORDER — ACETAMINOPHEN 500 MG PO TABS
1000.0000 mg | ORAL_TABLET | Freq: Once | ORAL | Status: AC
  Administered 2023-08-27: 1000 mg via ORAL
  Filled 2023-08-27: qty 2

## 2023-08-27 MED ORDER — METHOCARBAMOL 750 MG PO TABS: 750.0000 mg | ORAL_TABLET | Freq: Three times a day (TID) | ORAL | 0 refills | Status: DC | PRN
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
## 2023-08-27 NOTE — ED Notes (Signed)
To x-ray
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
## 2023-08-27 NOTE — ED Notes (Signed)
Pt ok at present no complaints
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
## 2023-09-11 NOTE — Therapy (Incomplete)
OUTPATIENT PHYSICAL THERAPY LOWER EXTREMITY EVALUATION   Patient Name: [name] MRN: [id number] DOB:01/31/1971, 53 y.o., female Today's Date: 09/11/2023   END OF SESSION:   Past Medical History:  Diagnosis Date   Anemia    Anxiety    Arthritis    Asthma    Cataract    Mixed form OU   CKD (chronic kidney disease)    Coronary artery disease    Depression    Diabetes mellitus    Diabetic retinopathy (HCC)    NPDR OU   Dyspnea    Hyperlipidemia    Hypertension    Hypertensive retinopathy    OU   Left thyroid nodule    diagnosed 07/2018   PAC (premature atrial contraction) 02/15/2021   Pseudotumor cerebri    Sleep apnea    Uses a cpap   Stroke [facility])    Vitamin D deficiency    Past Surgical History:  Procedure Laterality Date   A/V FISTULAGRAM N/A 08/16/2023   Procedure: A/V Fistulagram;  Surgeon: Ethelene Hal, MD;  Location: [facility];  Service: Cardiovascular;  Laterality: N/A;   ACHILLES TENDON REPAIR Right    ACHILLES TENDON SURGERY Left 02/19/2021   Procedure: ACHILLES TENDON REPAIR WITH GRAFT;  Surgeon: Candelaria Stagers, DPM;  Location: [facility];  Service: Podiatry;  Laterality: Left;  BLOCK   AMPUTATION TOE Right 05/28/2021   Procedure: AMPUTATION TOE;  Surgeon: Louann Sjogren, MD;  Location: [facility] OR;  Service: Podiatry;  Laterality: Right;  Surgical team will do block   AV FISTULA PLACEMENT Left 07/29/2022   Procedure: LEFT ARTERIOVENOUS (AV) FISTULA CREATION;  Surgeon: Maeola Harman, MD;  Location: [facility] OR;  Service: Vascular;  Laterality: Left;   AV FISTULA PLACEMENT Right 04/04/2023   Procedure: RIGHT ARM BRACHIOCEPHALIC ARTERIOVENOUS (AV) FISTULA CREATION;  Surgeon: Victorino Sparrow, MD;  Location: [facility] OR;  Service: Vascular;  Laterality: Right;  With regional block   GASTROC RECESSION EXTREMITY Left 02/19/2021   Procedure: GASTROC RECESSION EXTREMITY;  Surgeon: Candelaria Stagers, DPM;  Location: [facility];   Service: Podiatry;  Laterality: Left;  Block   INSERTION OF DIALYSIS CATHETER Left 04/20/2023   Procedure: INSERTION OFTUNNELED  DIALYSIS CATHETER;  Surgeon: Victorino Sparrow, MD;  Location: [facility] OR;  Service: Vascular;  Laterality: Left;   PERIPHERAL VASCULAR BALLOON ANGIOPLASTY Right 08/16/2023   Procedure: PERIPHERAL VASCULAR BALLOON ANGIOPLASTY;  Surgeon: Ethelene Hal, MD;  Location: [facility];  Service: Cardiovascular;  Laterality: Right;  innominate   TOE SURGERY Left 01/2023   TUBAL LIGATION     Patient Active Problem List   Diagnosis Date Noted   Class 2 obesity 04/13/2023   Shortness of breath 04/12/2023   Acute exacerbation of CHF (congestive heart failure) (HCC) 04/12/2023   Acute on chronic diastolic CHF (congestive heart failure) (HCC) 04/12/2023   CKD (chronic kidney disease), stage IV (HCC) 04/12/2023   Acute renal failure superimposed on stage 4 chronic kidney disease (HCC) 04/20/2022   Prolonged QT interval 04/20/2022   Anemia of chronic disease 04/20/2022   Homeless 04/20/2022   Preop cardiovascular exam 01/10/2022   Adjustment disorder with anxious mood 10/26/2021   Allergic rhinitis due to pollen 10/26/2021   Anxiety 10/26/2021   Dysmenorrhea 10/26/2021   Insulin long-term use (HCC) 10/26/2021   Menorrhagia 10/26/2021   Mild intermittent asthma 10/26/2021   Stage 3 chronic kidney disease (HCC) 10/26/2021   Tobacco abuse 10/26/2021   Vitamin D  deficiency 10/26/2021   Chronic osteomyelitis of right foot with draining sinus (HCC)    Diabetic foot ulcer (HCC) 05/27/2021   PAC (premature atrial contraction) 02/15/2021   Symptomatic anemia 06/25/2020   Chronic heart failure with preserved ejection fraction (HCC) 06/25/2020   Class 2 severe obesity due to excess calories with serious comorbidity and body mass index (BMI) of 35.0 to 35.9 in adult (HCC) 03/30/2020   NSVT (nonsustained ventricular tachycardia) (HCC) 11/13/2019   Pericardial effusion 09/17/2019    H/O: stroke 04/09/2019   Left ventricular hypertrophy 04/09/2019   Mixed hyperlipidemia 04/08/2019   Non compliance w medication regimen 02/20/2019   Resistant hypertension    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HCC) 02/13/2019   Cerebrovascular accident (CVA) (HCC)    Hypokalemia    Acute CVA (cerebrovascular accident) (HCC) 02/11/2019   Hypertensive urgency 02/11/2019   ARF (acute renal failure) (HCC) 02/11/2019   Type 2 diabetes mellitus with hyperlipidemia (HCC) 02/11/2019   Enlargement of thyroid 07/13/2018   Uncontrolled type 2 diabetes mellitus with hyperglycemia, with long-term current use of insulin (HCC) 04/12/2018   Essential hypertension, benign 01/08/2018   Obstructive sleep apnea 08/26/2017    PCP: Jackie Plum, MD   REFERRING PROVIDER: Norm Salt, PA    REFERRING DIAG: Left knee pain   THERAPY DIAG:  No diagnosis found.  RATIONALE FOR EVALUATION AND TREATMENT: Rehabilitation  ONSET DATE: ***  NEXT MD VISIT: ***   SUBJECTIVE:                                                                                                                                                                                                         SUBJECTIVE STATEMENT: ***  PAIN: Are you having pain? {OPRCPAIN:27236}  PERTINENT HISTORY:  ***  PRECAUTIONS: {Therapy precautions:24002}  RED FLAGS: {PT Red Flags:29287}  WEIGHT BEARING RESTRICTIONS: {Yes ***/No:24003}  FALLS:  Has patient fallen in last 6 months? {fallsyesno:27318}  LIVING ENVIRONMENT: Lives with: {OPRC lives with:25569::"lives with their family"} Lives in: {Lives in:25570} Stairs: {opstairs:27293} Has following equipment at home: {Assistive devices:23999}  OCCUPATION: ***  PLOF: {PLOF:24004}  PATIENT GOALS: ***   OBJECTIVE: (objective measures completed at initial evaluation unless otherwise dated)  DIAGNOSTIC FINDINGS:  ***08/27/23 - DG Left knee: IMPRESSION: 1. No acute findings. 2. Mild  osteoarthritis.  08/27/23 - DG Lumbar Spine: IMPRESSION: 1. No acute findings. 2. Mild spondylosis of the lumbar spine.   PATIENT SURVEYS:  {rehab surveys:24030}  COGNITION: Overall cognitive status: {cognition:24006}    SENSATION: {sensation:27233}  EDEMA:  {edema:24020}  POSTURE:  {posture:25561}  PALPATION: ***  MUSCLE LENGTH: Hamstrings: Right *** deg; Left *** deg Maisie Fus test: Right *** deg; Left *** deg Hamstrings: *** ITB: *** Piriformis: *** Hip flexors: *** Quads: *** Heelcord: ***  LOWER EXTREMITY ROM:  {AROM/PROM:27142} ROM Right eval Left eval  Hip flexion    Hip extension    Hip abduction    Hip adduction    Hip internal rotation    Hip external rotation    Knee flexion    Knee extension    Ankle dorsiflexion    Ankle plantarflexion    Ankle inversion    Ankle eversion     {AROM/PROM:27142} ROM Right eval Left eval  Hip flexion    Hip extension    Hip abduction    Hip adduction    Hip internal rotation    Hip external rotation    Knee flexion    Knee extension    Ankle dorsiflexion    Ankle plantarflexion    Ankle inversion    Ankle eversion    (Blank rows = not tested)  LOWER EXTREMITY MMT:  MMT Right eval Left eval  Hip flexion    Hip extension    Hip abduction    Hip adduction    Hip internal rotation    Hip external rotation    Knee flexion    Knee extension    Ankle dorsiflexion    Ankle plantarflexion    Ankle inversion    Ankle eversion     (Blank rows = not tested)  LOWER EXTREMITY SPECIAL TESTS:  {LEspecialtests:26242}  FUNCTIONAL TESTS:  {Functional tests:24029}  GAIT: Distance walked: *** Assistive device utilized: {Assistive devices:23999} Level of assistance: {Levels of assistance:24026} Gait pattern: {gait characteristics:25376} Comments: ***   TODAY'S TREATMENT:   ***   PATIENT EDUCATION:  Education details: {Education details:27468}  Person educated: {Person educated:25204} Education  method: {Education Method:25205} Education comprehension: {Education Comprehension:25206}  HOME EXERCISE PROGRAM: ***   ASSESSMENT:  CLINICAL IMPRESSION: [name] is a 53 y.o. female who was referred to physical therapy for evaluation and treatment for ***.  ***   Patient reports onset of *** pain beginning ***. Pain is worse with ***.  Patient has deficits in *** ROM, *** LE flexibility, *** strength, ***abnormal posture, and TTP with abnormal muscle tension *** which are interfering with ADLs and are impacting quality of life.  On LEFS patient scored ***/80 demonstrating ***% disability.  Sydny will benefit from skilled PT to address above deficits to improve mobility and activity tolerance with decreased pain interference.  OBJECTIVE IMPAIRMENTS: {opptimpairments:25111}.   ACTIVITY LIMITATIONS: {activitylimitations:27494}  PARTICIPATION LIMITATIONS: {participationrestrictions:25113}  PERSONAL FACTORS: {Personal factors:25162} are also affecting patient's functional outcome.   REHAB POTENTIAL: {rehabpotential:25112}  CLINICAL DECISION MAKING: {clinical decision making:25114}  EVALUATION COMPLEXITY: {Evaluation complexity:25115}   GOALS: Goals reviewed with patient? {yes/no:20286}  SHORT TERM GOALS: Target date: ***  Patient will be independent with initial HEP. Baseline: *** Goal status: {GOALSTATUS:25110}  2.  *** Baseline: *** Goal status: {GOALSTATUS:25110}  3.  *** Baseline: *** Goal status: {GOALSTATUS:25110}   LONG TERM GOALS: Target date: ***  Patient will be independent with advanced/ongoing HEP to improve outcomes and carryover.  Baseline: *** Goal status: {GOALSTATUS:25110}  2.  Patient will report at least 75% improvement in *** knee pain to improve QOL. Baseline: *** Goal status: {GOALSTATUS:25110}  3.  Patient will demonstrate improved *** knee AROM to >/= ***-*** deg to allow for normal gait and stair mechanics. Baseline: *** Goal  status: {GOALSTATUS:25110}  4.  Patient will demonstrate improved *** strength to >/= ***/5 for improved stability and ease of mobility. Baseline: *** Goal status: {GOALSTATUS:25110}  5.  Patient will be able to ambulate 600' with LRAD and normal gait pattern without increased pain to access community.  Baseline: *** Goal status: {GOALSTATUS:25110}  6. Patient will be able to ascend/descend stairs with 1 HR and reciprocal step pattern safely to access home and community.  Baseline: *** Goal status: {GOALSTATUS:25110}  7.  Patient will report >/= ***/80 on LEFS (MCID = 9 pts) to demonstrate improved functional ability. Baseline: *** Goal status: {GOALSTATUS:25110}  8.  Patient will demonstrate at least 19/24 on DGI to decrease risk of falls. Baseline: *** Goal status: {GOALSTATUS:25110}   9.  *** Baseline: *** Goal status: {GOALSTATUS:25110}   PLAN:  PT FREQUENCY: {rehab frequency:25116}  PT DURATION: {rehab duration:25117}  PLANNED INTERVENTIONS: {rehab planned interventions:25118::"97110-Therapeutic exercises","97530- Therapeutic [phone]- Neuromuscular re-education","97535- Self JXBJ","47829- Manual therapy"}  PLAN FOR NEXT SESSION: ***   Marry Guan, PT 09/11/2023, 6:11 PM

## 2023-09-13 ENCOUNTER — Ambulatory Visit: Payer: Medicaid Other | Admitting: Physical Therapy

## 2023-09-20 ENCOUNTER — Ambulatory Visit (INDEPENDENT_AMBULATORY_CARE_PROVIDER_SITE_OTHER): Payer: Medicaid Other | Admitting: Podiatry

## 2023-09-20 DIAGNOSIS — Z91199 Patient's noncompliance with other medical treatment and regimen due to unspecified reason: Secondary | ICD-10-CM

## 2023-09-20 NOTE — Progress Notes (Signed)
No show

## 2023-09-22 ENCOUNTER — Other Ambulatory Visit: Payer: Self-pay | Admitting: *Deleted

## 2023-09-22 DIAGNOSIS — N186 End stage renal disease: Secondary | ICD-10-CM

## 2023-09-27 ENCOUNTER — Encounter (HOSPITAL_COMMUNITY): Payer: Self-pay

## 2023-09-27 ENCOUNTER — Encounter: Payer: Self-pay | Admitting: Podiatry

## 2023-09-27 ENCOUNTER — Ambulatory Visit: Payer: Medicaid Other | Admitting: Podiatry

## 2023-09-27 DIAGNOSIS — B351 Tinea unguium: Secondary | ICD-10-CM | POA: Diagnosis not present

## 2023-09-27 DIAGNOSIS — M79674 Pain in right toe(s): Secondary | ICD-10-CM

## 2023-09-27 DIAGNOSIS — M79675 Pain in left toe(s): Secondary | ICD-10-CM | POA: Diagnosis not present

## 2023-09-27 DIAGNOSIS — E1142 Type 2 diabetes mellitus with diabetic polyneuropathy: Secondary | ICD-10-CM

## 2023-09-27 NOTE — Progress Notes (Signed)
  Subjective:  Patient ID: [name], female    DOB: 07-07-1971,   MRN: [id number]  No chief complaint on file.   53 y.o. female presents for concern of thickened elongated and painful nails that are difficult to trim. Requesting to have them trimmed today. Relates burning and tingling in their feet. Patient is diabetic and last A1c was  Lab Results  Component Value Date   HGBA1C 7.4 (A) 03/28/2023   .   PCP:  Jackie Plum, MD    . Denies any other pedal complaints. Denies n/v/f/c.   Past Medical History:  Diagnosis Date   Anemia    Anxiety    Arthritis    Asthma    Cataract    Mixed form OU   CKD (chronic kidney disease)    Coronary artery disease    Depression    Diabetes mellitus    Diabetic retinopathy (HCC)    NPDR OU   Dyspnea    Hyperlipidemia    Hypertension    Hypertensive retinopathy    OU   Left thyroid nodule    diagnosed 07/2018   PAC (premature atrial contraction) 02/15/2021   Pseudotumor cerebri    Sleep apnea    Uses a cpap   Stroke (HCC)    Vitamin D deficiency     Objective:  Physical Exam: Vascular: DP/PT pulses 2/4 bilateral. CFT <3 seconds. Absent hair growth on digits. Edema noted to bilateral lower extremities. Xerosis noted bilaterally.  Skin. No lacerations or abrasions bilateral feet. Nails 1-5 bilateral  are thickened discolored and elongated with subungual debris.  Musculoskeletal: MMT 5/5 bilateral lower extremities in DF, PF, Inversion and Eversion. Deceased ROM in DF of ankle joint. 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  Neurological: Sensation intact to light touch. Protective sensation diminished bilateral.    Assessment:   1. Pain due to onychomycosis of toenails of both feet   2. Type 2 diabetes mellitus with diabetic polyneuropathy, with long-term current use of insulin (HCC)       Plan:  Patient was evaluated and treated and all questions answered. -Discussed and educated patient on  diabetic foot care, especially with  regards to the vascular, neurological and musculoskeletal systems.  -Stressed the importance of good glycemic control and the detriment of not  controlling glucose levels in relation to the foot. -Discussed supportive shoes at all times and checking feet regularly.  -Mechanically debrided all nails 1-5 bilateral using sterile nail nipper and filed with dremel without incident  -Answered all patient questions -Patient to return  in 3 months for at risk foot care -Patient advised to call the office if any problems or questions arise in the meantime.   Louann Sjogren, DPM

## 2023-09-29 ENCOUNTER — Telehealth: Payer: Self-pay

## 2023-09-29 NOTE — Progress Notes (Signed)
 HPI 53 year old female smoker followed for OSA, complicated by arthritis, DM 2, HBP, pseudotumor cerebri NPSG 07/03/17- AHI 10.1/ hr, desaturation to 89%, body weight 212 lbs, + PLMA   ------------------------------------------------------------------------------------------------------   09/29/22- 51 yoF  Smoker followed for OSA, complicated by arthritis, DM 2, HTN, pseudotumor cerebri, CVA, CHF, CAD, NSVT, CKD/ ESRD, Obesity, Anemia,  CPAP auto 10-20/[employer] begun 08/25/2017.   AirSense 10 AutoSet Download- compliance  Body weight today- Covid vax-no Flu vax-no Followed by Nephrology and Cardiology for HTN                  today 142/ 98 Fistula placed L upper arm Will need to take her CPAP to DME to get AirView/  Card --------Pt is doing well. No complaints with cpap machine  She sleeps better with CPAP, reporting at least 4 hours and disturbs sleep as long as she uses it. Machine is working well.  She needs to take it into the [employer] to get download capability installed, apparently Airview has expired. She has failing renal function.  A fistula placed in her left arm apparently does not have adequate flow so she is pending placement of a graft but does not know when she might start hemodialysis. Breathing she says is fine.  10/02/23- 52 yoF  Smoker followed for OSA, complicated by arthritis, DM 2, HTN, pseudotumor cerebri, CVA, CHF, CAD, NSVT, CKD/ ESRD, Obesity, Anemia,  CPAP auto 10-20/[employer] begun 08/25/2017.   AirSense 10 AutoSet Download- compliance -  Body weight today-232 lbs Reports using CPAP every night. [employer] hasn't sent her SD card.  Nose dry and stuffy now after 4 hours, since indoor heat on. Discussed trial of nasal saline gel.  Otherwise breathing is ok. She is now eligible to replace old machine. We will continue auto 10-20 and get AirView installed.  ROS-see HPI   + = positive Constitutional:    weight loss, night sweats, fevers, chills,+ fatigue, lassitude. HEENT:     headaches, difficulty swallowing, tooth/dental problems, sore throat,       sneezing, itching, ear ache, nasal congestion, post nasal drip, snoring CV:    chest pain, orthopnea, PND, swelling in lower extremities, anasarca,                                                  dizziness, palpitations Resp:   +shortness of breath with exertion or at rest.                +productive cough,   non-productive cough, coughing up of blood.              change in color of mucus.  wheezing.   Skin:    rash or lesions. GI:  No-   heartburn, indigestion, abdominal pain, nausea, vomiting, diarrhea,                 change in bowel habits, loss of appetite GU: dysuria, change in color of urine, no urgency or frequency.   flank pain. MS:   joint pain, stiffness, decreased range of motion, back pain. Neuro-     nothing unusual Psych:  change in mood or affect.  depression or anxiety.   memory loss.  OBJ- Physical Exam   BP 160/100 on arrival General- Alert, Oriented, Affect-appropriate, Distress- none acute, + obese Skin- rash-none, lesions- none, excoriation- none  Lymphadenopathy-  none Head- atraumatic            Eyes- Gross vision intact, PERRLA, conjunctivae and secretions clear            Ears- Hearing, canals-normal            Nose- Clear, no-Septal dev, mucus, polyps, erosion, perforation             Throat- Mallampati III , mucosa clear , drainage- none, tonsils- atrophic,  + tongue stud Neck- flexible , trachea midline, no stridor , thyroid nl, carotid no bruit Chest - symmetrical excursion , unlabored           Heart/CV- RRR , no murmur , no gallop  , no rub, nl s1 s2                           - JVD- none , edema- none, stasis changes- none, varices- none           Lung- clear to P&A, wheeze- none, cough- none , dullness-none, rub- none           Chest wall-  Abd-  Br/ Gen/ Rectal- Not done, not indicated Extrem- +cane Neuro- grossly intact to observation

## 2023-10-02 ENCOUNTER — Encounter: Payer: Self-pay | Admitting: Internal Medicine

## 2023-10-02 ENCOUNTER — Ambulatory Visit: Payer: Medicaid Other | Admitting: Internal Medicine

## 2023-10-02 VITALS — BP 145/75 | HR 81 | Temp 97.8°F | Resp 18 | Ht 69.0 in | Wt 232.4 lb

## 2023-10-02 DIAGNOSIS — G4733 Obstructive sleep apnea (adult) (pediatric): Secondary | ICD-10-CM | POA: Diagnosis not present

## 2023-10-02 DIAGNOSIS — E66812 Obesity, class 2: Secondary | ICD-10-CM

## 2023-10-02 NOTE — Patient Instructions (Signed)
 Order- DME [employer]- please replace old CPAP machine, auto 10-20, mask of choice, humidifier, supplies, AirView/ card  Try a little otc nasal saline gel in your nose at  bedtime, or whenever you want it, to help with dryness.

## 2023-10-02 NOTE — Progress Notes (Unsigned)
    Postoperative Access Visit   History of Present Illness   [name] is a 53 y.o. year old female who presents for postoperative follow-up for: right brachiocephalic arteriovenous fistula (Date: 04/04/23).  The patient's wounds are  healed.  The patient denies steal symptoms.  The patient is able to complete their activities of daily living.  She is currently dialyzing via left IJ [facility] on a Tuesday Thursday Saturday schedule at the [employer] location.  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  2)      The body of the cephalic vein fistula, inflow, and arch were patent with good flows; a few accessory veins in the body of the fistula. 3)      The inflow was widely patent. 4)      This right BCF remains amenable to future percutaneous intervention; will refer to Dr. Karin Lieu to assess again dialysis unit has not been able to cannulate this fistula.  It does not appear to be deep but I wonder if the fistula would augment better making cannulation easier if the accessory veins were obliterated.   Physical Examination   There were no vitals filed for this visit.  There is no height or weight on file to calculate BMI.  right arm Incision has healed, palpable radial pulse, hand grip is 5/5, sensation in digits is intact, palpable thrill, bruit can be auscultated     Medical Decision Making   [name] is a 53 y.o. year old female who presents s/p right brachiocephalic arteriovenous fistula  Patent brachiocephalic fistula without signs or symptoms of steal syndrome The patient's access will be ready for use 07/11/23 The patient's tunneled dialysis catheter can be removed when Nephrology is comfortable with the performance of the fistula The patient may follow up on a prn basis   Emilie Rutter PA-C [facility] Office:  [phone]  Clinic MD: Karin Lieu

## 2023-10-02 NOTE — Assessment & Plan Note (Signed)
 Multiple medical problems, may of which would improve if she can get her weight down.

## 2023-10-02 NOTE — Assessment & Plan Note (Signed)
 Benefits from and uses CPAP, but DME hasn't sent her an SD card. Plan- replace old machine, auto 10-20, install Download capability.

## 2023-10-05 ENCOUNTER — Ambulatory Visit (INDEPENDENT_AMBULATORY_CARE_PROVIDER_SITE_OTHER): Payer: Medicaid Other | Admitting: Vascular Surgery

## 2023-10-05 ENCOUNTER — Ambulatory Visit (HOSPITAL_COMMUNITY)
Admission: RE | Admit: 2023-10-05 | Discharge: 2023-10-05 | Disposition: A | Discharge status: Home / Self Care | Payer: Medicaid Other | Source: Ambulatory Visit | Attending: Vascular Surgery | Admitting: Vascular Surgery

## 2023-10-05 ENCOUNTER — Encounter: Payer: Self-pay | Admitting: Vascular Surgery

## 2023-10-05 VITALS — BP 133/76 | HR 87 | Temp 98.7°F | Wt 230.4 lb

## 2023-10-05 DIAGNOSIS — T82590A Other mechanical complication of surgically created arteriovenous fistula, initial encounter: Secondary | ICD-10-CM | POA: Diagnosis not present

## 2023-10-05 DIAGNOSIS — N186 End stage renal disease: Secondary | ICD-10-CM | POA: Diagnosis present

## 2023-10-16 ENCOUNTER — Emergency Department (HOSPITAL_COMMUNITY): Admission: EM | Admit: 2023-10-16 | Discharge: 2023-10-17 | Discharge status: Against Medical Advice

## 2023-10-16 ENCOUNTER — Encounter (HOSPITAL_COMMUNITY): Payer: Self-pay

## 2023-10-16 ENCOUNTER — Other Ambulatory Visit: Payer: Self-pay

## 2023-10-16 DIAGNOSIS — L02421 Furuncle of right axilla: Secondary | ICD-10-CM | POA: Diagnosis not present

## 2023-10-16 DIAGNOSIS — Z7982 Long term (current) use of aspirin: Secondary | ICD-10-CM | POA: Diagnosis not present

## 2023-10-16 DIAGNOSIS — L02411 Cutaneous abscess of right axilla: Secondary | ICD-10-CM | POA: Diagnosis not present

## 2023-10-16 DIAGNOSIS — Z794 Long term (current) use of insulin: Secondary | ICD-10-CM | POA: Diagnosis not present

## 2023-10-16 DIAGNOSIS — Z5321 Procedure and treatment not carried out due to patient leaving prior to being seen by health care provider: Secondary | ICD-10-CM | POA: Diagnosis not present

## 2023-10-16 DIAGNOSIS — L0202 Furuncle of face: Secondary | ICD-10-CM | POA: Diagnosis present

## 2023-10-16 DIAGNOSIS — L0201 Cutaneous abscess of face: Secondary | ICD-10-CM | POA: Diagnosis present

## 2023-10-16 LAB — BASIC METABOLIC PANEL
Anion gap: 15 (ref 5–15)
BUN: 50 mg/dL — ABNORMAL HIGH (ref 6–20)
CO2: 23 mmol/L (ref 22–32)
Calcium: 8.5 mg/dL — ABNORMAL LOW (ref 8.9–10.3)
Chloride: 99 mmol/L (ref 98–111)
Creatinine, Ser: 6.16 mg/dL — ABNORMAL HIGH (ref 0.44–1.00)
GFR, Estimated: 8 mL/min — ABNORMAL LOW (ref >60)
Glucose, Bld: 179 mg/dL — ABNORMAL HIGH (ref 70–99)
Potassium: 3.4 mmol/L — ABNORMAL LOW (ref 3.5–5.1)
Sodium: 137 mmol/L (ref 135–145)

## 2023-10-16 LAB — CBC WITH DIFFERENTIAL/PLATELET
Abs Immature Granulocytes: 0.03 10*3/uL (ref 0.00–0.07)
Basophils Absolute: 0 10*3/uL (ref 0.0–0.1)
Basophils Relative: 0 %
Eosinophils Absolute: 0.3 10*3/uL (ref 0.0–0.5)
Eosinophils Relative: 4 %
HCT: 26.1 % — ABNORMAL LOW (ref 36.0–46.0)
Hemoglobin: 8.6 g/dL — ABNORMAL LOW (ref 12.0–15.0)
Immature Granulocytes: 0 %
Lymphocytes Relative: 21 %
Lymphs Abs: 1.4 10*3/uL (ref 0.7–4.0)
MCH: 32.6 pg (ref 26.0–34.0)
MCHC: 33 g/dL (ref 30.0–36.0)
MCV: 98.9 fL (ref 80.0–100.0)
Monocytes Absolute: 0.4 10*3/uL (ref 0.1–1.0)
Monocytes Relative: 7 %
Neutro Abs: 4.6 10*3/uL (ref 1.7–7.7)
Neutrophils Relative %: 68 %
Platelets: 220 10*3/uL (ref 150–400)
RBC: 2.64 MIL/uL — ABNORMAL LOW (ref 3.87–5.11)
RDW: 15.6 % — ABNORMAL HIGH (ref 11.5–15.5)
WBC: 6.7 10*3/uL (ref 4.0–10.5)
nRBC: 0 % (ref 0.0–0.2)

## 2023-10-16 LAB — CBG MONITORING, ED: Glucose-Capillary: 180 mg/dL — ABNORMAL HIGH (ref 70–99)

## 2023-10-16 NOTE — ED Provider Triage Note (Signed)
 Emergency Medicine Provider Triage Evaluation Note  [name] , a 53 y.o. female  was evaluated in triage.  Pt complains of boil on face that has been ongoing started as a pimple progressively worsening did bust but is oozing and crusting on the left side of the face since the beginning of February.  Also has multiple boils under her right axilla.  [name] sugars have been running "high."  Review of Systems  Positive: Boils Negative: Fever  Physical Exam  BP (!) 166/75 (BP Location: Left Arm)   Pulse 76   Temp 98.2 F (36.8 C)   Resp 16   LMP 12/21/2021   SpO2 93%  Gen:   Awake, no distress   Resp:  Normal effort  MSK:   Moves extremities without difficulty  Other:    Medical Decision Making  Medically screening exam initiated at 6:37 PM.  Appropriate orders placed.  SAYAKA HOEPPNER was informed that the remainder of the evaluation will be completed by another provider, this initial triage assessment does not replace that evaluation, and the importance of remaining in the ED until their evaluation is complete.     Arthor Captain, PA-C 10/16/23 [phone]

## 2023-10-16 NOTE — Telephone Encounter (Signed)
 tried pt cell it would pick up and hang up could not leave a messaage nor speak to pt about mondays appt.10/02/23. talked with [facility] and she never called to fix her Cpap problem.

## 2023-10-16 NOTE — ED Triage Notes (Addendum)
 Pt arrives via POV. Pt has a abscess to left side of face since February. States it started out as a small pimple. She also has an abscess to right axilla. She is a diabetic, states her BG has been running higher than normal.

## 2023-10-17 ENCOUNTER — Encounter (HOSPITAL_COMMUNITY): Payer: Self-pay

## 2023-10-17 ENCOUNTER — Other Ambulatory Visit: Payer: Self-pay

## 2023-10-17 ENCOUNTER — Emergency Department (HOSPITAL_COMMUNITY)
Admission: EM | Admit: 2023-10-17 | Discharge: 2023-10-17 | Disposition: A | Discharge status: Home / Self Care | Source: Home / Self Care

## 2023-10-17 DIAGNOSIS — L0201 Cutaneous abscess of face: Secondary | ICD-10-CM | POA: Insufficient documentation

## 2023-10-17 DIAGNOSIS — Z7982 Long term (current) use of aspirin: Secondary | ICD-10-CM | POA: Insufficient documentation

## 2023-10-17 DIAGNOSIS — L02411 Cutaneous abscess of right axilla: Secondary | ICD-10-CM | POA: Insufficient documentation

## 2023-10-17 DIAGNOSIS — Z794 Long term (current) use of insulin: Secondary | ICD-10-CM | POA: Insufficient documentation

## 2023-10-17 MED ORDER — DOXYCYCLINE HYCLATE 100 MG PO TABS
100.0000 mg | ORAL_TABLET | Freq: Two times a day (BID) | ORAL | 0 refills | Status: DC
Start: 2023-10-17 — End: 2023-11-13

## 2023-10-17 MED ORDER — DOXYCYCLINE HYCLATE 100 MG PO TABS
100.0000 mg | ORAL_TABLET | Freq: Once | ORAL | Status: AC
  Administered 2023-10-17: 100 mg via ORAL
  Filled 2023-10-17: qty 1

## 2023-10-17 NOTE — ED Provider Notes (Signed)
 [place] EMERGENCY DEPARTMENT AT [facility] Provider Note   CSN: [id number] Arrival date & time: 10/17/23  1233     History  Chief Complaint  Patient presents with   Boils    [name] is a 53 y.o. female.  Patient complains of an abscess on the left side of her face and a abscessed area under her right axilla.  Patient is diabetic.  Patient has renal failure she is on dialysis.  Patient reports that she has been soaking the area on the side of her face.  Patient has been putting Betadine on the site.  Patient denies any fever or chills.  Patient denies any previous history of infections.  Patient reports that she came in yesterday and had lab work done but did not stay because the wait was so long.  Patient does not want repeat lab work today.  Patient did have a normal white blood cell count yesterday.  Patient went to dialysis today.  She is not concerned about being volume overloaded.        Home Medications Prior to Admission medications   Medication Sig Start Date End Date Taking? Authorizing Provider  albuterol (VENTOLIN HFA) 108 (90 Base) MCG/ACT inhaler Inhale 1-2 puffs into the lungs every 6 (six) hours as needed for wheezing or shortness of breath (Asthma).    [provider]  aspirin 81 MG EC tablet Take 1 tablet (81 mg total) by mouth daily. 10/14/20   Patwardhan, Anabel Bene, MD  Blood Pressure Monitor DEVI 1 Device by Does not apply route daily. 07/17/23   Patwardhan, Anabel Bene, MD  calcitRIOL (ROCALTROL) 0.25 MCG capsule Take 0.25 mcg by mouth daily. 10/07/22   [provider]  calcium acetate (PHOSLO) 667 MG capsule Take 1 capsule (667 mg total) by mouth 3 (three) times daily with meals. Patient taking differently: Take 1,334 mg by mouth 3 (three) times daily with meals. 04/22/23   Zannie Cove, MD  Continuous Blood Gluc Receiver (DEXCOM G7 RECEIVER) DEVI Use to monitor BG continuously 04/13/22   Nida, Denman George, MD  Continuous  Glucose Sensor (DEXCOM G7 SENSOR) MISC CHANGE SENSOR EVERY 10 DAYS 08/11/23   Roma Kayser, MD  doxycycline (VIBRA-TABS) 100 MG tablet Take 1 tablet (100 mg total) by mouth 2 (two) times daily. 10/17/23   Elson Areas, PA-C  furosemide (LASIX) 20 MG tablet Take 20 mg by mouth daily.    [provider]  insulin aspart protamine - aspart (NOVOLOG 70/30 MIX) (70-30) 100 UNIT/ML FlexPen Inject 10 Units into the skin 2 (two) times daily with a meal. Patient taking differently: Inject 20 Units into the skin 2 (two) times daily with a meal. 04/22/23   Zannie Cove, MD  Insulin Pen Needle 32G X 4 MM MISC Use to inject insulin twice daily 01/23/23   Roma Kayser, MD  isosorbide-hydrALAZINE (BIDIL) 20-37.5 MG tablet Take 1 tablet by mouth 3 (three) times daily. 04/23/22   Almon Hercules, MD  labetalol (NORMODYNE) 300 MG tablet Take 300 mg by mouth 2 (two) times daily. 08/16/21   [provider]  methocarbamol (ROBAXIN-750) 750 MG tablet Take 1 tablet (750 mg total) by mouth every 8 (eight) hours as needed for muscle spasms. 08/27/23   Arby Barrette, MD  Methoxy PEG-Epoetin Beta (MIRCERA IJ) Mircera 07/18/23 07/16/24  [provider]  multivitamin (RENA-VIT) TABS tablet Take 1 tablet by mouth daily.    [provider]  rosuvastatin (CRESTOR) 40 MG  tablet Take 40 mg by mouth in the morning.    [provider]  Semaglutide,0.25 or 0.5MG /DOS, (OZEMPIC, 0.25 OR 0.5 MG/DOSE,) 2 MG/3ML SOPN INJECT 0.25MG  INTO THE SKIN ONE TIME PER WEEK 07/31/23   Roma Kayser, MD      Allergies    Bee pollen, Dust mite extract, and Mixed ragweed    Review of Systems   Review of Systems  All other systems reviewed and are negative.   Physical Exam Updated Vital Signs BP (!) 144/83   Pulse 83   Temp 98.7 F (37.1 C) (Tympanic)   Resp 18   Ht 5\' 9"  (1.753 m)   Wt 102 kg   LMP 12/21/2021   SpO2 97%   BMI 33.21 kg/m  Physical Exam Vitals and nursing  note reviewed.  Constitutional:      Appearance: She is well-developed.  HENT:     Head: Normocephalic.     Comments: 1 cm discolored area left cheek with greenish drainage. Multiple small pimples right axilla, no drainage. Cardiovascular:     Rate and Rhythm: Normal rate.  Pulmonary:     Effort: Pulmonary effort is normal.  Abdominal:     General: There is no distension.  Musculoskeletal:        General: Normal range of motion.  Skin:    General: Skin is warm.  Neurological:     General: No focal deficit present.     Mental Status: She is alert and oriented to person, place, and time.     ED Results / Procedures / Treatments   Labs (all labs ordered are listed, but only abnormal results are displayed) Labs Reviewed - No data to display  EKG None  Radiology No results found.  Procedures Procedures    Medications Ordered in ED Medications  doxycycline (VIBRA-TABS) tablet 100 mg (100 mg Oral Given 10/17/23 2029)    ED Course/ Medical Decision Making/ A&P                                 Medical Decision Making Patient complains of an abscess on the left side of his face.  Patient has swollen areas under her right arm.  Amount and/or Complexity of Data Reviewed External Data Reviewed: labs.    Details: Labs from yesterday reviewed patient does not have an elevated white blood cell count Discussion of management or test interpretation with external provider(s): I consulted pharmacy for treatment options as patient is on dialysis.  They advised to cover with doxycycline.  Patient is given doxycycline 100 mg here and a prescription for doxycycline 100 mg #14 twice daily.  Risk Prescription drug management. Risk Details: Patient advised to continue warm compresses return to the emergency department if symptoms worsen or change           Final Clinical Impression(s) / ED Diagnoses Final diagnoses:  Abscess of face  Abscess of axilla, right    Rx / DC  Orders ED Discharge Orders          Ordered    doxycycline (VIBRA-TABS) 100 MG tablet  2 times daily        10/17/23 2011          An After Visit Summary was printed and given to the patient.     Elson Areas, PA-C 10/17/23 2308    Coral Spikes, DO 10/17/23 2353

## 2023-10-17 NOTE — Discharge Instructions (Signed)
 Continue soaking area 20 minutes every 4 hours.  Return if any problems.

## 2023-10-17 NOTE — ED Triage Notes (Signed)
 Pt came in via POV d/t an infection on her Lt cheek & boils under her Rt arm, the boil have been draining but they aren't now. The facial infection started as a small bump & it has been there since February. A/Ox4, rates her pain 8/10.

## 2023-10-17 NOTE — ED Notes (Signed)
 Patient declines labs at this time d/t them being drawn yesterday for same complaint.

## 2023-10-17 NOTE — ED Notes (Signed)
 Pt left d/t dialysis appt at 6am in [facility]

## 2023-10-17 NOTE — ED Provider Triage Note (Signed)
 Emergency Medicine Provider Triage Evaluation Note  [name] , a 53 y.o. female  was evaluated in triage.  Pt complains of abscess.  Review of Systems  Positive:  Negative:   Physical Exam  BP 137/70 (BP Location: Left Arm)   Pulse 87   Temp 98.2 F (36.8 C) (Oral)   Resp 18   Ht 5\' 9"  (1.753 m)   Wt 102 kg   LMP 12/21/2021   SpO2 100%   BMI 33.21 kg/m  Gen:   Awake, no distress   Resp:  Normal effort  MSK:   Moves extremities without difficulty Other:    Medical Decision Making  Medically screening exam initiated at 2:12 PM.  Appropriate orders placed.  NAZARIA RIESEN was informed that the remainder of the evaluation will be completed by another provider, this initial triage assessment does not replace that evaluation, and the importance of remaining in the ED until their evaluation is complete.  Abscesses on face and right axilla has been progressing x3 weeks. Patient has not been on ABX yet - she has been cleaning area and applying ABX ointment. Denies fever, chest pain, dyspnea, nausea, vomiting, diarrhea.  Patient currently declines labs stating that they were just drawn yesterday.   Valrie Hart F, [place] 10/17/23 1414

## 2023-11-03 ENCOUNTER — Other Ambulatory Visit: Payer: Self-pay | Admitting: "Endocrinology

## 2023-11-08 ENCOUNTER — Other Ambulatory Visit: Payer: Self-pay

## 2023-11-08 ENCOUNTER — Telehealth (HOSPITAL_COMMUNITY): Payer: Self-pay | Admitting: *Deleted

## 2023-11-08 DIAGNOSIS — T82590A Other mechanical complication of surgically created arteriovenous fistula, initial encounter: Secondary | ICD-10-CM

## 2023-11-08 NOTE — Telephone Encounter (Signed)
 Received fax from Dr Paulene Floor due to continued issues with cannulation requesting ligation of collaterals and superficialization.  Will notify Marchelle Folks and scan fax into media.

## 2023-11-16 ENCOUNTER — Other Ambulatory Visit: Payer: Self-pay

## 2023-11-16 ENCOUNTER — Encounter (HOSPITAL_COMMUNITY): Payer: Self-pay | Admitting: Vascular Surgery

## 2023-11-16 NOTE — Anesthesia Preprocedure Evaluation (Signed)
 Anesthesia Evaluation  Patient identified by MRN, date of birth, ID band Patient awake    Reviewed: Allergy & Precautions, NPO status , [name] Chart, lab work & pertinent test results  History of Anesthesia Complications Negative for: history of anesthetic complications  Airway Mallampati: III  TM Distance: >3 FB Neck ROM: Full    Dental  (+)    Pulmonary neg shortness of breath, asthma (last used inhaler ~1 month ago) , sleep apnea and Continuous Positive Airway Pressure Ventilation , neg COPD, neg recent URI, former smoker   Pulmonary exam normal breath sounds clear to auscultation       Cardiovascular hypertension, Pt. on home beta blockers and Pt. on medications (-) angina + CAD and +CHF (EF 60-65%)  (-) Past MI, (-) Cardiac Stents and (-) CABG + dysrhythmias (PACs, NSVT, prolonged QT) + Valvular Problems/Murmurs (mild) AI and MR  Rhythm:Regular Rate:Normal  HLD  TTE 04/12/2023: IMPRESSIONS    1. Left ventricular ejection fraction, by estimation, is 60 to 65%. The  left ventricle has normal function. The left ventricle has no regional  wall motion abnormalities. There is severe left ventricular hypertrophy.  Left ventricular diastolic parameters   are indeterminate. Elevated left atrial pressure.   2. Right ventricular systolic function is low normal. The right  ventricular size is normal.   3. A small pericardial effusion is present. The pericardial effusion is  posterior to the left ventricle. There is no evidence of cardiac  tamponade.   4. The mitral valve is grossly normal. Mild mitral valve regurgitation.  No evidence of mitral stenosis.   5. The aortic valve is tricuspid. There is mild calcification of the  aortic valve. There is mild thickening of the aortic valve. Aortic valve  regurgitation is mild. Aortic valve sclerosis/calcification is present,  without any evidence of aortic  stenosis.   6. The inferior  vena cava is dilated in size with >50% respiratory  variability, suggesting right atrial pressure of 8 mmHg.     Neuro/Psych neg Seizures PSYCHIATRIC DISORDERS Anxiety Depression    Pseudotumor cerebri CVA (~2020), No Residual Symptoms    GI/Hepatic negative GI ROS, Neg liver ROS,,,  Endo/Other  diabetes, Type 2, Insulin Dependent  Thyroid nodule  Renal/GU ESRF and DialysisRenal disease     Musculoskeletal  (+) Arthritis ,    Abdominal  (+) + obese  Peds  Hematology  (+) Blood dyscrasia, anemia Lab Results      Component                Value               Date                      WBC                      6.7                 10/16/2023                HGB                      8.6 (L)             10/16/2023                HCT  26.1 (L)            10/16/2023                MCV                      98.9                10/16/2023                PLT                      220                 10/16/2023              Anesthesia Other Findings Last Ozempic: 11/05/2023  Reproductive/Obstetrics                             Anesthesia Physical Anesthesia Plan  ASA: 3  Anesthesia Plan: MAC and Regional   Post-op Pain Management: Regional block* and Tylenol PO (pre-op)*   Induction: Intravenous  PONV Risk Score and Plan: 2 and Ondansetron, Dexamethasone and Treatment may vary due to age or medical condition  Airway Management Planned: Natural Airway and Simple Face Mask  Additional Equipment:   Intra-op Plan:   Post-operative Plan:   Informed Consent: I have reviewed the patients History and Physical, chart, labs and discussed the procedure including the risks, benefits and alternatives for the proposed anesthesia with the patient or authorized representative who has indicated his/her understanding and acceptance.     Dental advisory given  Plan Discussed with: CRNA and Anesthesiologist  Anesthesia Plan Comments: (Discussed  potential risks of nerve blocks including, but not limited to, infection, bleeding, nerve damage, seizures, pneumothorax, respiratory depression, and potential failure of the block. Alternatives to nerve blocks discussed. All questions answered.  Discussed with patient risks of MAC including, but not limited to, minor pain or discomfort, hearing people in the room, and possible need for backup general anesthesia. Risks for general anesthesia also discussed including, but not limited to, sore throat, hoarse voice, chipped/damaged teeth, injury to vocal cords, nausea and vomiting, allergic reactions, lung infection, heart attack, stroke, and death. All questions answered. )       Anesthesia Quick Evaluation

## 2023-11-16 NOTE — Progress Notes (Addendum)
 SDW CALL  Patient was given pre-op instructions over the phone. The opportunity was given for the patient to ask questions. No further questions asked. Patient verbalized understanding of instructions given.   PCP - Greggory Stallion Osei-Bonsu,MD Cardiologist - Manish Patwardhan,MD  PPM/ICD - denies Device Orders -  Rep Notified -   Chest x-ray - 08/27/23 EKG - 08/28/23 Stress Test - denies ECHO - 08/28/23 Cardiac Cath -   Sleep Study -  CPAP -   Fasting Blood Sugar - 160-200 Checks Blood Sugar has continuous blood glucose monitor.  Blood Thinner Instructions:na Aspirin Instructions:contiue  ERAS Protcol -no PRE-SURGERY Ensure or G2-   COVID TEST- na   Anesthesia review: no  Patient denies shortness of breath, fever, cough and chest pain over the phone call    Surgical Instructions    Your procedure is scheduled on April 11  Report to [facility] Main Entrance "A" at 0530 A.M., then check in with the Admitting office.  Call this number if you have problems the morning of surgery:  [phone]    Remember:  Do not eat after midnight the night before your surgery    Take these medicines the morning of surgery with A SIP OF WATER: Aspirin,Tricor,labetalol. PRN albuterol; bring inhaler to the hospital.   As of today, STOP taking any ) Aleve, Naproxen, Ibuprofen, Motrin, Advil, Goody's, BC's, all herbal medications, fish oil, and all vitamins. This includes your LOVAZA.  WHAT DO I DO ABOUT MY DIABETES MEDICATION?  OZEMPIC SHOULD BE HELD 7 DAYS PRIOR TO SURGERY. NO OZEMPIC AFTER 11/09/23. Pt reports she last took Ozempic 11/05/23.   Do not take oral diabetes medicines (pills) the morning of surgery.  THE NIGHT BEFORE SURGERY, take 14 units of Novolog 70/30 insulin.  Do not take bedtime dose of Novolog insulin.      THE MORNING OF SURGERY, take NO units of 70/30 insulin.   Check your blood sugar the morning of your surgery when you wake up and every 2 hours until you get  to the Short Stay unit.  If your blood sugar is less than 70 mg/dL, you will need to treat for low blood sugar: Do not take insulin. Treat a low blood sugar (less than 70 mg/dL) with  cup of clear juice (cranberry or apple), 4 glucose tablets, OR glucose gel. Recheck blood sugar in 15 minutes after treatment (to make sure it is greater than 70 mg/dL). If your blood sugar is not greater than 70 mg/dL on recheck, call [phone] for further instructions. Report your blood sugar to the short stay nurse when you get to Short Stay.  [facility] is not responsible for any belongings or valuables. .   Do NOT Smoke (Tobacco/Vaping)  24 hours prior to your procedure  If you use a CPAP at night, you may bring your mask for your overnight stay.   Contacts, glasses, hearing aids, dentures or partials may not be worn into surgery, please bring cases for these belongings   Patients discharged the day of surgery will not be allowed to drive home, and someone needs to stay with them for 24 hours.    Special instructions:    Oral Hygiene is also important to reduce your risk of infection.  Remember - BRUSH YOUR TEETH THE MORNING OF SURGERY WITH YOUR REGULAR TOOTHPASTE   Day of Surgery:  Take a shower the day of or night before with antibacterial soap. Wear Clean/Comfortable clothing the morning of surgery Do not apply any deodorants/lotions.  Do not wear jewelry or makeup Do not wear lotions, powders, perfumes/colognes, or deodorant. Do not shave 48 hours prior to surgery.  Men may shave face and neck. Do not bring valuables to the hospital. Do not wear nail polish, gel polish, artificial nails, or any other type of covering on natural nails (fingers and toes) If you have artificial nails or gel coating that need to be removed by a nail salon, please have this removed prior to surgery. Artificial nails or gel coating may interfere with anesthesia's ability to adequately monitor your vital  signs. Remember to brush your teeth WITH YOUR REGULAR TOOTHPASTE.

## 2023-11-17 ENCOUNTER — Other Ambulatory Visit: Payer: Self-pay

## 2023-11-17 ENCOUNTER — Encounter (HOSPITAL_COMMUNITY): Payer: Self-pay | Admitting: Vascular Surgery

## 2023-11-17 ENCOUNTER — Ambulatory Visit (HOSPITAL_COMMUNITY)
Admission: RE | Admit: 2023-11-17 | Discharge: 2023-11-17 | Disposition: A | Discharge status: Home / Self Care | Attending: Vascular Surgery | Admitting: Vascular Surgery

## 2023-11-17 ENCOUNTER — Ambulatory Visit (HOSPITAL_COMMUNITY): Payer: Self-pay | Admitting: Anesthesiology

## 2023-11-17 ENCOUNTER — Encounter (HOSPITAL_COMMUNITY)
Admission: RE | Disposition: A | Discharge status: Home / Self Care | Payer: Self-pay | Source: Home / Self Care | Attending: Vascular Surgery

## 2023-11-17 ENCOUNTER — Other Ambulatory Visit (HOSPITAL_COMMUNITY): Payer: Self-pay

## 2023-11-17 DIAGNOSIS — I129 Hypertensive chronic kidney disease with stage 1 through stage 4 chronic kidney disease, or unspecified chronic kidney disease: Secondary | ICD-10-CM | POA: Diagnosis not present

## 2023-11-17 DIAGNOSIS — I509 Heart failure, unspecified: Secondary | ICD-10-CM

## 2023-11-17 DIAGNOSIS — N189 Chronic kidney disease, unspecified: Secondary | ICD-10-CM | POA: Diagnosis not present

## 2023-11-17 DIAGNOSIS — Z992 Dependence on renal dialysis: Secondary | ICD-10-CM

## 2023-11-17 DIAGNOSIS — I132 Hypertensive heart and chronic kidney disease with heart failure and with stage 5 chronic kidney disease, or end stage renal disease: Secondary | ICD-10-CM

## 2023-11-17 DIAGNOSIS — E1122 Type 2 diabetes mellitus with diabetic chronic kidney disease: Secondary | ICD-10-CM

## 2023-11-17 DIAGNOSIS — I2510 Atherosclerotic heart disease of native coronary artery without angina pectoris: Secondary | ICD-10-CM | POA: Insufficient documentation

## 2023-11-17 DIAGNOSIS — Z87891 Personal history of nicotine dependence: Secondary | ICD-10-CM | POA: Diagnosis not present

## 2023-11-17 DIAGNOSIS — N186 End stage renal disease: Secondary | ICD-10-CM | POA: Diagnosis present

## 2023-11-17 DIAGNOSIS — T82590A Other mechanical complication of surgically created arteriovenous fistula, initial encounter: Secondary | ICD-10-CM

## 2023-11-17 DIAGNOSIS — T82898A Other specified complication of vascular prosthetic devices, implants and grafts, initial encounter: Secondary | ICD-10-CM | POA: Diagnosis not present

## 2023-11-17 DIAGNOSIS — Y828 Other medical devices associated with adverse incidents: Secondary | ICD-10-CM | POA: Diagnosis not present

## 2023-11-17 DIAGNOSIS — G4730 Sleep apnea, unspecified: Secondary | ICD-10-CM | POA: Insufficient documentation

## 2023-11-17 DIAGNOSIS — T82318A Breakdown (mechanical) of other vascular grafts, initial encounter: Secondary | ICD-10-CM | POA: Diagnosis not present

## 2023-11-17 HISTORY — PX: REVISON OF ARTERIOVENOUS FISTULA: SHX6074

## 2023-11-17 HISTORY — PX: LIGATION OF ARTERIOVENOUS  FISTULA: SHX5948

## 2023-11-17 LAB — POCT I-STAT, CHEM 8
BUN: 37 mg/dL — ABNORMAL HIGH (ref 6–20)
Calcium, Ion: 1.12 mmol/L — ABNORMAL LOW (ref 1.15–1.40)
Chloride: 94 mmol/L — ABNORMAL LOW (ref 98–111)
Creatinine, Ser: 7.1 mg/dL — ABNORMAL HIGH (ref 0.44–1.00)
Glucose, Bld: 225 mg/dL — ABNORMAL HIGH (ref 70–99)
HCT: 37 % (ref 36.0–46.0)
Hemoglobin: 12.6 g/dL (ref 12.0–15.0)
Potassium: 3.5 mmol/L (ref 3.5–5.1)
Sodium: 136 mmol/L (ref 135–145)
TCO2: 28 mmol/L (ref 22–32)

## 2023-11-17 LAB — GLUCOSE, CAPILLARY
Glucose-Capillary: 204 mg/dL — ABNORMAL HIGH (ref 70–99)
Glucose-Capillary: 164 mg/dL — ABNORMAL HIGH (ref 70–99)

## 2023-11-17 SURGERY — REVISON OF ARTERIOVENOUS FISTULA
Anesthesia: Monitor Anesthesia Care | Laterality: Right

## 2023-11-17 MED ORDER — ACETAMINOPHEN 500 MG PO TABS
1000.0000 mg | ORAL_TABLET | Freq: Once | ORAL | Status: AC
  Administered 2023-11-17: 1000 mg via ORAL
  Filled 2023-11-17: qty 2

## 2023-11-17 MED ORDER — LIDOCAINE 2% (20 MG/ML) 5 ML SYRINGE
INTRAMUSCULAR | Status: DC | PRN
  Administered 2023-11-17: 40 mg via INTRAVENOUS

## 2023-11-17 MED ORDER — FENTANYL CITRATE (PF) 250 MCG/5ML IJ SOLN
INTRAMUSCULAR | Status: AC
  Filled 2023-11-17: qty 5

## 2023-11-17 MED ORDER — OXYCODONE HCL 5 MG PO TABS: 5.0000 mg | ORAL_TABLET | Freq: Once | ORAL | Status: DC | PRN

## 2023-11-17 MED ORDER — HEPARIN 6000 UNIT IRRIGATION SOLUTION
Status: AC
  Filled 2023-11-17: qty 500

## 2023-11-17 MED ORDER — SODIUM CHLORIDE 0.9 % IV SOLN: INTRAVENOUS | Status: DC | PRN

## 2023-11-17 MED ORDER — LIDOCAINE 2% (20 MG/ML) 5 ML SYRINGE
INTRAMUSCULAR | Status: AC
  Filled 2023-11-17: qty 5

## 2023-11-17 MED ORDER — FENTANYL CITRATE (PF) 250 MCG/5ML IJ SOLN
INTRAMUSCULAR | Status: DC | PRN
  Administered 2023-11-17: 50 ug via INTRAVENOUS
  Administered 2023-11-17 (×3): 25 ug via INTRAVENOUS

## 2023-11-17 MED ORDER — PROPOFOL 10 MG/ML IV BOLUS
INTRAVENOUS | Status: DC | PRN
  Administered 2023-11-17 (×3): 30 mg via INTRAVENOUS

## 2023-11-17 MED ORDER — ONDANSETRON HCL 4 MG/2ML IJ SOLN
INTRAMUSCULAR | Status: DC | PRN
  Administered 2023-11-17: 4 mg via INTRAVENOUS

## 2023-11-17 MED ORDER — INSULIN ASPART 100 UNIT/ML IJ SOLN: 0.0000 [IU] | INTRAMUSCULAR | Status: DC | PRN

## 2023-11-17 MED ORDER — OXYCODONE-ACETAMINOPHEN 5-325 MG PO TABS
1.0000 | ORAL_TABLET | Freq: Four times a day (QID) | ORAL | 0 refills | Status: DC | PRN
  Filled 2023-11-17: qty 20, 5d supply, fill #0

## 2023-11-17 MED ORDER — HEPARIN 6000 UNIT IRRIGATION SOLUTION
Status: DC | PRN
  Administered 2023-11-17: 1

## 2023-11-17 MED ORDER — GLYCOPYRROLATE PF 0.2 MG/ML IJ SOSY
PREFILLED_SYRINGE | INTRAMUSCULAR | Status: DC | PRN
  Administered 2023-11-17: .2 mg via INTRAVENOUS

## 2023-11-17 MED ORDER — CEFAZOLIN SODIUM-DEXTROSE 2-4 GM/100ML-% IV SOLN
2.0000 g | INTRAVENOUS | Status: AC
  Administered 2023-11-17: 2 g via INTRAVENOUS
  Filled 2023-11-17: qty 100

## 2023-11-17 MED ORDER — ROPIVACAINE HCL 5 MG/ML IJ SOLN
INTRAMUSCULAR | Status: DC | PRN
  Administered 2023-11-17: 30 mL via PERINEURAL

## 2023-11-17 MED ORDER — GLYCOPYRROLATE PF 0.2 MG/ML IJ SOSY
PREFILLED_SYRINGE | INTRAMUSCULAR | Status: AC
  Filled 2023-11-17: qty 1

## 2023-11-17 MED ORDER — ORAL CARE MOUTH RINSE: 15.0000 mL | Freq: Once | OROMUCOSAL | Status: AC

## 2023-11-17 MED ORDER — OXYCODONE HCL 5 MG/5ML PO SOLN: 5.0000 mg | Freq: Once | ORAL | Status: DC | PRN

## 2023-11-17 MED ORDER — AMISULPRIDE (ANTIEMETIC) 5 MG/2ML IV SOLN: 10.0000 mg | Freq: Once | INTRAVENOUS | Status: DC | PRN

## 2023-11-17 MED ORDER — MIDAZOLAM HCL 2 MG/2ML IJ SOLN
INTRAMUSCULAR | Status: DC | PRN
  Administered 2023-11-17: 1 mg via INTRAVENOUS

## 2023-11-17 MED ORDER — 0.9 % SODIUM CHLORIDE (POUR BTL) OPTIME
TOPICAL | Status: DC | PRN
  Administered 2023-11-17: 1000 mL

## 2023-11-17 MED ORDER — SODIUM CHLORIDE 0.9% FLUSH: 3.0000 mL | INTRAVENOUS | Status: DC | PRN

## 2023-11-17 MED ORDER — HEPARIN SODIUM (PORCINE) 1000 UNIT/ML IJ SOLN
INTRAMUSCULAR | Status: AC
  Filled 2023-11-17: qty 10

## 2023-11-17 MED ORDER — INSULIN ASPART 100 UNIT/ML IJ SOLN
INTRAMUSCULAR | Status: AC
  Administered 2023-11-17: 2 [IU] via SUBCUTANEOUS
  Filled 2023-11-17: qty 1

## 2023-11-17 MED ORDER — PHENYLEPHRINE HCL-NACL 20-0.9 MG/250ML-% IV SOLN
INTRAVENOUS | Status: DC | PRN
  Administered 2023-11-17: 25 ug/min via INTRAVENOUS

## 2023-11-17 MED ORDER — PROPOFOL 500 MG/50ML IV EMUL
INTRAVENOUS | Status: DC | PRN
  Administered 2023-11-17: 100 ug/kg/min via INTRAVENOUS

## 2023-11-17 MED ORDER — CHLORHEXIDINE GLUCONATE 0.12 % MT SOLN
15.0000 mL | Freq: Once | OROMUCOSAL | Status: AC
  Administered 2023-11-17: 15 mL via OROMUCOSAL
  Filled 2023-11-17: qty 15

## 2023-11-17 MED ORDER — PROPOFOL 10 MG/ML IV BOLUS
INTRAVENOUS | Status: AC
  Filled 2023-11-17: qty 20

## 2023-11-17 MED ORDER — CHLORHEXIDINE GLUCONATE 4 % EX SOLN: 60.0000 mL | Freq: Once | CUTANEOUS | Status: DC

## 2023-11-17 MED ORDER — LIDOCAINE-EPINEPHRINE (PF) 1 %-1:200000 IJ SOLN
INTRAMUSCULAR | Status: AC
  Filled 2023-11-17: qty 30

## 2023-11-17 MED ORDER — FENTANYL CITRATE (PF) 100 MCG/2ML IJ SOLN: 25.0000 ug | INTRAMUSCULAR | Status: DC | PRN

## 2023-11-17 MED ORDER — MIDAZOLAM HCL 2 MG/2ML IJ SOLN
INTRAMUSCULAR | Status: AC
Start: 2023-11-17 — End: ?
  Filled 2023-11-17: qty 2

## 2023-11-17 MED ORDER — ONDANSETRON HCL 4 MG/2ML IJ SOLN
INTRAMUSCULAR | Status: AC
  Filled 2023-11-17: qty 2

## 2023-11-17 SURGICAL SUPPLY — 38 items
APPLIER CLIP 9.375 SM OPEN (CLIP) ×1 IMPLANT
ARMBAND PINK RESTRICT EXTREMIT (MISCELLANEOUS) ×2 IMPLANT
BAG COUNTER SPONGE SURGICOUNT (BAG) ×2 IMPLANT
BNDG ELASTIC 4X5.8 VLCR STR LF (GAUZE/BANDAGES/DRESSINGS) ×2 IMPLANT
CANISTER SUCT 3000ML PPV (MISCELLANEOUS) ×2 IMPLANT
CLIP APPLIE 9.375 SM OPEN (CLIP) ×2 IMPLANT
CLIP TI MEDIUM 24 (CLIP) ×2 IMPLANT
CLIP TI MEDIUM 6 (CLIP) ×2 IMPLANT
CLIP TI WIDE RED SMALL 24 (CLIP) ×2 IMPLANT
CLIP TI WIDE RED SMALL 6 (CLIP) ×2 IMPLANT
COVER PROBE W GEL 5X96 (DRAPES) ×2 IMPLANT
DERMABOND ADVANCED .7 DNX12 (GAUZE/BANDAGES/DRESSINGS) ×2 IMPLANT
ELECT REM PT RETURN 9FT ADLT (ELECTROSURGICAL) ×1 IMPLANT
ELECTRODE REM PT RTRN 9FT ADLT (ELECTROSURGICAL) ×2 IMPLANT
GAUZE SPONGE 4X4 12PLY STRL (GAUZE/BANDAGES/DRESSINGS) ×2 IMPLANT
GLOVE BIOGEL PI IND STRL 8 (GLOVE) ×2 IMPLANT
GOWN STRL REUS W/ TWL LRG LVL3 (GOWN DISPOSABLE) ×4 IMPLANT
GOWN STRL REUS W/TWL 2XL LVL3 (GOWN DISPOSABLE) ×4 IMPLANT
KIT BASIN OR (CUSTOM PROCEDURE TRAY) ×2 IMPLANT
KIT TURNOVER KIT B (KITS) ×2 IMPLANT
NS IRRIG 1000ML POUR BTL (IV SOLUTION) ×2 IMPLANT
PACK CV ACCESS (CUSTOM PROCEDURE TRAY) ×2 IMPLANT
PAD ARMBOARD POSITIONER FOAM (MISCELLANEOUS) ×4 IMPLANT
SLING ARM FOAM STRAP LRG (SOFTGOODS) IMPLANT
SLING ARM FOAM STRAP MED (SOFTGOODS) IMPLANT
SPIKE FLUID TRANSFER (MISCELLANEOUS) ×2 IMPLANT
STAPLER VISISTAT 35W (STAPLE) IMPLANT
SUT ETHILON 3 0 PS 1 (SUTURE) IMPLANT
SUT MNCRL AB 4-0 PS2 18 (SUTURE) ×4 IMPLANT
SUT PROLENE 5 0 C 1 24 (SUTURE) IMPLANT
SUT PROLENE 6 0 BV (SUTURE) IMPLANT
SUT PROLENE 7 0 BV 1 (SUTURE) IMPLANT
SUT SILK 0 TIES 10X30 (SUTURE) ×2 IMPLANT
SUT SILK 3 0 SH CR/8 (SUTURE) ×2 IMPLANT
SUT VIC AB 3-0 SH 27X BRD (SUTURE) ×2 IMPLANT
TOWEL GREEN STERILE (TOWEL DISPOSABLE) ×2 IMPLANT
UNDERPAD 30X36 HEAVY ABSORB (UNDERPADS AND DIAPERS) ×2 IMPLANT
WATER STERILE IRR 1000ML POUR (IV SOLUTION) ×2 IMPLANT

## 2023-11-17 NOTE — Op Note (Signed)
    NAME: [name]    MRN: [id number] DOB: [birth year]    DATE OF OPERATION: 11/17/2023  PREOP DIAGNOSIS:    Fistula malfunction  POSTOP DIAGNOSIS:    Same  PROCEDURE:   Right arm brachiocephalic fistula revision, branch ligation, superficialization  SURGEON: Victorino Sparrow  ASSIST: None  ANESTHESIA: Moderate, block  EBL: 15 mL  INDICATIONS:    [name] is a 53 y.o. female with a history of right sided brachiocephalic fistula.  They have had difficulty accessing the fistula.  Fistulogram demonstrated a large branch.  After discussing risks and benefits of branch ligation, superficialization in an effort to improve access, the patient elected to proceed.  FINDINGS:   6 mm right sided brachiocephalic fistula tapering with some amount of spasm at the level of the axilla.  Multiple branches.  Some tortuosity.  TECHNIQUE:   Patient was brought to the OR and laid in supine position.  Moderate anesthesia was induced. The patient was prepped and draped in standard fashion.  Lidocaine was brought to the field and a local block was performed.   The case began with right arm ultrasound fistula mapping.  Multiple branches were noted and marked.  two longitudinal skip incisions were made along the course of the cephalic vein with 5 cm bridges.  This was carried through the subcutaneous fat to the brachiocephalic fistula.  The fistula was mobilized and multiple branches ligated using 2-0 silk and clips.  Once mobilized, the subcutaneous tissue plane was made less than 5 mm from skin between the dermis and subcutaneous fat.  The subcutaneous fat was opposed to the opposite side using 2.0 vicryl, creating a bedding for the fistula.  Hemostasis was achieved with the use of cautery and suture.  The subcutaneous flap was closed using 3-0 Vicryl and 4-0 Monocryl at the skin.  There was a palpable thrill in the fistula at case completion with excellent signal in the wrist.      Victorino Sparrow, MD [facility] DATE OF DICTATION:   11/17/2023

## 2023-11-17 NOTE — Anesthesia Postprocedure Evaluation (Signed)
 Anesthesia Post Note  Patient: DENESE MENTINK  Procedure(s) Performed: REVISON OF ARTERIOVENOUS FISTULA (Right) LIGATION OF ARTERIOVENOUS  FISTULA (Right)     Patient location during evaluation: PACU Anesthesia Type: Regional and MAC Level of consciousness: awake Pain management: pain level controlled Vital Signs Assessment: post-procedure vital signs reviewed and stable Respiratory status: spontaneous breathing, nonlabored ventilation and respiratory function stable Cardiovascular status: stable and blood pressure returned to baseline Postop Assessment: no apparent nausea or vomiting Anesthetic complications: no   No notable events documented.  Last Vitals:  Vitals:   11/17/23 0930 11/17/23 0945  BP: 126/69 131/73  Pulse: 75 73  Resp: 15 13  Temp:  36.7 C  SpO2: 100% 97%    Last Pain:  Vitals:   11/17/23 0945  TempSrc:   PainSc: 0-No pain                 Linton Rump

## 2023-11-17 NOTE — Anesthesia Procedure Notes (Signed)
 Anesthesia Regional Block: Supraclavicular block   Pre-Anesthetic Checklist: , timeout performed,  Correct Patient, Correct Site, Correct Laterality,  Correct Procedure, Correct Position, site marked,  Risks and benefits discussed,  Surgical consent,  Pre-op evaluation,  At surgeon's request and post-op pain management  Laterality: Right  Prep: chloraprep       Needles:  Injection technique: Single-shot  Needle Type: Echogenic Stimulator Needle     Needle Length: 9cm  Needle Gauge: 21     Additional Needles:   Procedures:,,,, ultrasound used (permanent image in chart),,    Narrative:  Start time: 11/17/2023 7:26 AM End time: 11/17/2023 7:30 AM Injection made incrementally with aspirations every 5 mL.  Performed by: Personally  Anesthesiologist: Linton Rump, MD  Additional Notes: Discussed risks and benefits of nerve block including, but not limited to, prolonged and/or permanent nerve injury involving sensory and/or motor function. Monitors were applied and a time-out was performed. The nerve and associated structures were visualized under ultrasound guidance. After negative aspiration, local anesthetic was slowly injected around the nerve. There was no evidence of high pressure during the procedure. There were no paresthesias. VSS remained stable and the patient tolerated the procedure well.

## 2023-11-17 NOTE — H&P (Signed)
    Preop access revision   History of Present Illness   [name] is a 53 y.o. year old female who presents for postoperative follow-up for: right brachiocephalic arteriovenous fistula (Date: 04/04/23).  She is currently dialyzing via left IJ [facility] on a Tuesday Thursday Saturday schedule at the [employer] location.  She has had some issues with fistula cannulization.  The last attempt was a few weeks ago  Fistulogram by Dr. 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  Dr. Juel Burrow venoplastied the lesion.  There is some concern that branching may be to blame.  There is also some tortuosity   Physical Examination   Vitals:   11/17/23 0600  BP: 133/60  Pulse: 75  Resp: 18  Temp: 97.8 F (36.6 C)  TempSrc: Oral  SpO2: 97%  Weight: 100 kg  Height: 5\' 9"  (1.753 m)    Body mass index is 32.56 kg/m.  right arm Incision has healed, palpable radial pulse, hand grip is 5/5, sensation in digits is intact, palpable thrill    Findings:  +--------------------+----------+-----------------+--------+  AVF                PSV (cm/s)Flow Vol (mL/min)Comments  +--------------------+----------+-----------------+--------+  Native artery inflow   292          1277                 +--------------------+----------+-----------------+--------+  AVF Anastomosis        552                               +--------------------+----------+-----------------+--------+     +------------+----------+-------------+----------+----------------+  OUTFLOW VEINPSV (cm/s)Diameter (cm)Depth (cm)    Describe      +------------+----------+-------------+----------+----------------+  Prox UA        138        0.66        1.00                     +------------+----------+-------------+----------+----------------+  Mid UA         435        0.62        0.45   competing branch   +------------+----------+-------------+----------+----------------+  Dist UA        132        0.70        0.60                     +------------+----------+-------------+----------+----------------+  AC Fossa       512        0.74        0.32                     +------------+----------+-------------+----------+----------------+   Medical Decision Making   [name] is a 53 y.o. year old female who presents s/p right brachiocephalic arteriovenous fistula  Patent brachiocephalic fistula without signs or symptoms of steal syndrome Fistula has a flow volume of nearly 1300 mL/min, which is more than adequate Still difficulty with cannulation. Plan will be for  right fistula branch ligation and transposition in an effort to bring it closer to the skin.     Victorino Sparrow MD [facility] [place] Office: [phone] Clinic MD: Karin Lieu

## 2023-11-17 NOTE — Transfer of Care (Signed)
 Immediate Anesthesia Transfer of Care Note  Patient: [name]  Procedure(s) Performed: REVISON OF ARTERIOVENOUS FISTULA (Right) LIGATION OF ARTERIOVENOUS  FISTULA (Right)  Patient Location: PACU  Anesthesia Type:MAC and Regional  Level of Consciousness: awake, drowsy, patient cooperative, and responds to stimulation  Airway & Oxygen Therapy: Patient Spontanous Breathing and Patient connected to face mask oxygen  Post-op Assessment: Report given to RN and Post -op Vital signs reviewed and stable  Post vital signs: Reviewed and stable  Last Vitals:  Vitals Value Taken Time  BP    Temp    Pulse 76 11/17/23 0926  Resp 13 11/17/23 0926  SpO2 100 % 11/17/23 0926  Vitals shown include unfiled device data.  Last Pain:  Vitals:   11/17/23 0605  TempSrc:   PainSc: 0-No pain      Patients Stated Pain Goal: 0 (11/17/23 6962)  Complications: No notable events documented.

## 2023-11-17 NOTE — Discharge Instructions (Signed)

## 2023-11-18 ENCOUNTER — Encounter (HOSPITAL_COMMUNITY): Payer: Self-pay | Admitting: Vascular Surgery

## 2023-12-01 ENCOUNTER — Other Ambulatory Visit: Payer: Self-pay | Admitting: "Endocrinology

## 2023-12-05 ENCOUNTER — Other Ambulatory Visit: Payer: Self-pay

## 2023-12-05 DIAGNOSIS — N186 End stage renal disease: Secondary | ICD-10-CM

## 2023-12-06 ENCOUNTER — Ambulatory Visit (HOSPITAL_COMMUNITY)
Admission: RE | Admit: 2023-12-06 | Discharge: 2023-12-06 | Disposition: A | Discharge status: Home / Self Care | Source: Ambulatory Visit | Attending: Vascular Surgery | Admitting: Vascular Surgery

## 2023-12-06 DIAGNOSIS — N186 End stage renal disease: Secondary | ICD-10-CM

## 2023-12-07 ENCOUNTER — Ambulatory Visit: Attending: Vascular Surgery | Admitting: Physician Assistant

## 2023-12-07 ENCOUNTER — Encounter (HOSPITAL_COMMUNITY)

## 2023-12-07 VITALS — BP 159/94 | HR 91 | Temp 98.4°F | Wt 220.2 lb

## 2023-12-07 DIAGNOSIS — N186 End stage renal disease: Secondary | ICD-10-CM

## 2023-12-07 NOTE — Progress Notes (Signed)
 POST OPERATIVE OFFICE NOTE    CC:  F/u for surgery  HPI:  This is a 53 y.o. female who is s/p right brachiocephalic fistula creation by Dr. Rosalva Comber on 04/04/2023.  She subsequently required revision of her fistula with superficialization and branch ligation on 11/17/2023.  She believes the incisions are well-healed.  She denies steal syndrome in her right hand.  She is dialyzing via left IJ [facility] on a Tuesday Thursday Saturday schedule at [facility] in [facility].  Allergies  Allergen Reactions   Bee Pollen Other (See Comments)    Seasonal Allergies   Dust Mite Extract Cough    Sneezing & Cough   Mixed Ragweed Itching    Current Outpatient Medications  Medication Sig Dispense Refill   albuterol  (PROVENTIL ) (2.5 MG/3ML) 0.083% nebulizer solution Take 2.5 mg by nebulization every 6 (six) hours as needed for wheezing or shortness of breath.     albuterol  (VENTOLIN  HFA) 108 (90 Base) MCG/ACT inhaler Inhale 1-2 puffs into the lungs every 6 (six) hours as needed for wheezing or shortness of breath (Asthma).     aspirin  81 MG EC tablet Take 1 tablet (81 mg total) by mouth daily. 90 tablet 2   Blood Pressure Monitor DEVI 1 Device by Does not apply route daily. 1 each 0   calcitRIOL  (ROCALTROL ) 0.25 MCG capsule Take 0.25 mcg by mouth daily.     calcium  acetate (PHOSLO ) 667 MG capsule Take 1 capsule (667 mg total) by mouth 3 (three) times daily with meals. (Patient taking differently: Take 1,334 mg by mouth 3 (three) times daily with meals.) 90 capsule 1   Continuous Blood Gluc Receiver (DEXCOM G7 RECEIVER) DEVI Use to monitor BG continuously 1 each 0   Continuous Glucose Sensor (DEXCOM G7 SENSOR) MISC CHANGE SENSOR EVERY 10 DAYS 3 each 0   fenofibrate  (TRICOR ) 145 MG tablet Take 145 mg by mouth daily.     furosemide  (LASIX ) 20 MG tablet Take 20 mg by mouth daily.     insulin  aspart protamine - aspart (NOVOLOG  70/30 MIX) (70-30) 100 UNIT/ML FlexPen Inject 10 Units into the skin 2 (two) times daily  with a meal. (Patient taking differently: Inject 20 Units into the skin 2 (two) times daily with a meal.)     Insulin  Pen Needle 32G X 4 MM MISC Use to inject insulin  twice daily 200 each 2   isosorbide -hydrALAZINE  (BIDIL ) 20-37.5 MG tablet Take 1 tablet by mouth 3 (three) times daily. (Patient taking differently: Take 2 tablets by mouth 3 (three) times daily.) 270 tablet 1   labetalol  (NORMODYNE ) 300 MG tablet Take 300 mg by mouth 2 (two) times daily.     LOVAZA 1 g capsule Take 2 g by mouth 2 (two) times daily.     Methoxy PEG-Epoetin  Beta (MIRCERA IJ) Mircera     multivitamin (RENA-VIT) TABS tablet Take 1 tablet by mouth daily.     NOVOLOG  FLEXPEN 100 UNIT/ML FlexPen Inject 10-15 Units into the skin at bedtime as needed for high blood sugar.     rosuvastatin  (CRESTOR ) 40 MG tablet Take 40 mg by mouth in the morning.     Semaglutide ,0.25 or 0.5MG /DOS, (OZEMPIC , 0.25 OR 0.5 MG/DOSE,) 2 MG/3ML SOPN INJECT 0.25MG  INTO THE SKIN ONE TIME PER WEEK 3 mL 2   oxyCODONE -acetaminophen  (PERCOCET/ROXICET) 5-325 MG tablet Take 1 tablet by mouth every 6 (six) hours as needed. (Patient not taking: Reported on 12/07/2023) 20 tablet 0   No current facility-administered medications for this visit.  ROS:  See HPI  Physical Exam:  Vitals:   12/07/23 1431  BP: (!) 159/94  Pulse: 91  Temp: 98.4 F (36.9 C)  TempSrc: Temporal  SpO2: 94%  Weight: 220 lb 3.2 oz (99.9 kg)    Incision: Incisions are well-healed Extremities: Palpable right radial; palpable thrill through fistula in upper arm Neuro: A&O  Assessment/Plan:  This is a 53 y.o. female who is s/p: Right brachiocephalic fistula revision with superficialization and branch ligation  Patent right arm brachiocephalic fistula with palpable thrill and no signs or symptoms of steal syndrome.  Incisions are well-healed.  Ok to begin cannulating fistula on 12/26/2023.  Nephrology will decide when to discontinue left IJ [facility] based on the performance of the  fistula.  She can follow-up on an as-needed basis.   Cordie Deters, PA-C [facility] [phone]  Clinic MD:  Rosalva Comber

## 2023-12-19 ENCOUNTER — Other Ambulatory Visit (HOSPITAL_COMMUNITY): Payer: Self-pay

## 2023-12-19 ENCOUNTER — Telehealth: Payer: Self-pay

## 2023-12-19 NOTE — Telephone Encounter (Signed)
 Pharmacy Patient Advocate Encounter   Received notification from CoverMyMeds that prior authorization for Dexcom G7 sensor is required/requested.   Insurance verification completed.   The patient is insured through [facility] .   Per test claim: PA required; PA submitted to above mentioned insurance via CoverMyMeds Key/confirmation #/EOC [id number] Status is pending

## 2023-12-21 NOTE — Telephone Encounter (Signed)
 Pharmacy Patient Advocate Encounter  Received notification from [facility] that Prior Authorization for Humatrope  6MG  cartridges has been APPROVED from 12-18-2023 to 12-17-2024   PA #/Case ID/Reference #: [id number]

## 2023-12-22 ENCOUNTER — Other Ambulatory Visit: Payer: Self-pay | Admitting: "Endocrinology

## 2024-01-03 ENCOUNTER — Ambulatory Visit (INDEPENDENT_AMBULATORY_CARE_PROVIDER_SITE_OTHER): Payer: No Typology Code available for payment source | Admitting: Podiatry

## 2024-01-03 DIAGNOSIS — Z91199 Patient's noncompliance with other medical treatment and regimen due to unspecified reason: Secondary | ICD-10-CM

## 2024-01-03 NOTE — Progress Notes (Signed)
 No show

## 2024-01-07 DIAGNOSIS — I4891 Unspecified atrial fibrillation: Secondary | ICD-10-CM

## 2024-01-07 HISTORY — DX: Unspecified atrial fibrillation: I48.91

## 2024-01-08 ENCOUNTER — Ambulatory Visit: Admitting: Dietician

## 2024-01-29 ENCOUNTER — Ambulatory Visit: Admitting: Dietician

## 2024-02-19 ENCOUNTER — Other Ambulatory Visit (HOSPITAL_COMMUNITY)
Admission: RE | Admit: 2024-02-19 | Discharge: 2024-02-19 | Disposition: A | Discharge status: Home / Self Care | Source: Ambulatory Visit | Attending: Obstetrics | Admitting: Obstetrics

## 2024-02-19 ENCOUNTER — Encounter: Payer: Self-pay | Admitting: Obstetrics

## 2024-02-19 ENCOUNTER — Ambulatory Visit (INDEPENDENT_AMBULATORY_CARE_PROVIDER_SITE_OTHER): Admitting: Obstetrics

## 2024-02-19 VITALS — BP 177/87 | HR 84 | Wt 223.9 lb

## 2024-02-19 DIAGNOSIS — Z113 Encounter for screening for infections with a predominantly sexual mode of transmission: Secondary | ICD-10-CM | POA: Diagnosis not present

## 2024-02-19 DIAGNOSIS — Z1239 Encounter for other screening for malignant neoplasm of breast: Secondary | ICD-10-CM | POA: Diagnosis not present

## 2024-02-19 DIAGNOSIS — E66811 Obesity, class 1: Secondary | ICD-10-CM

## 2024-02-19 DIAGNOSIS — N184 Chronic kidney disease, stage 4 (severe): Secondary | ICD-10-CM

## 2024-02-19 DIAGNOSIS — N898 Other specified noninflammatory disorders of vagina: Secondary | ICD-10-CM | POA: Insufficient documentation

## 2024-02-19 DIAGNOSIS — Z01419 Encounter for gynecological examination (general) (routine) without abnormal findings: Secondary | ICD-10-CM

## 2024-02-19 DIAGNOSIS — I5033 Acute on chronic diastolic (congestive) heart failure: Secondary | ICD-10-CM

## 2024-02-19 DIAGNOSIS — E785 Hyperlipidemia, unspecified: Secondary | ICD-10-CM

## 2024-02-19 DIAGNOSIS — I1A0 Resistant hypertension: Secondary | ICD-10-CM

## 2024-02-19 DIAGNOSIS — E1169 Type 2 diabetes mellitus with other specified complication: Secondary | ICD-10-CM

## 2024-02-19 NOTE — Progress Notes (Unsigned)
 Pt is in the office for annual Last pap 11/18/2021 Last mammogram 01/03/2023 Pt denies any abnormal symptoms Desires std testing BP is elevated today, pt states that she had dialysis today and is not suppose to take BP meds before.

## 2024-02-19 NOTE — Progress Notes (Unsigned)
 Subjective:        [name] is a 53 y.o. female here for a routine exam.  Current complaints: Vaginal discharge.    Personal health questionnaire:  Is patient Ashkenazi Jewish, have a family history of breast and/or ovarian cancer: {YES NO:22349} Is there a family history of uterine cancer diagnosed at age < 10, gastrointestinal cancer, urinary tract cancer, family member who is a Personnel officer syndrome-associated carrier: {YES NO:22349} Is the patient overweight and hypertensive, family history of diabetes, personal history of gestational diabetes, preeclampsia or PCOS: {YES NO:22349} Is patient over 8, have PCOS,  family history of premature CHD under age 49, diabetes, smoke, have hypertension or peripheral artery disease:  {YES NO:22349} At any time, has a partner hit, kicked or otherwise hurt or frightened you?: {YES NO:22349} Over the past 2 weeks, have you felt down, depressed or hopeless?: {YES NO:22349} Over the past 2 weeks, have you felt little interest or pleasure in doing things?:{M;yes/no/sometimes:15259}   Gynecologic History [name] last menstrual period was 12/21/2021. Contraception: {method:5051} Last Pap: ***. Results were: {norm/abn:16337} Last mammogram: ***. Results were: {norm/abn:16337}  Obstetric History OB History  Gravida Para Term Preterm AB Living  4 4 4   4   SAB IAB Ectopic Multiple Live Births      4    # Outcome Date GA Lbr Len/2nd Weight Sex Type Anes PTL Lv  4 Term      CS-LTranv     3 Term      Vag-Spont     2 Term      Vag-Spont     1 Term      Vag-Spont       Past Medical History:  Diagnosis Date   Anemia    Anxiety    Arthritis    Asthma    Atrial fib/flutter, transient (HCC) 01/2024   pt states that she was dx in [place]   Cataract    Mixed form OU   CKD (chronic kidney disease)    Coronary artery disease    Depression    Diabetes mellitus    Diabetic retinopathy (HCC)    NPDR OU   Dyspnea    Hyperlipidemia    Hypertension     Hypertensive retinopathy    OU   Left thyroid  nodule    diagnosed 07/2018   PAC (premature atrial contraction) 02/15/2021   Pseudotumor cerebri    Sleep apnea    Uses a cpap   Stroke (HCC)    Vitamin D deficiency     Past Surgical History:  Procedure Laterality Date   A/V FISTULAGRAM N/A 08/16/2023   Procedure: A/V Fistulagram;  Surgeon: Melia Lynwood ORN, MD;  [facility]: [facility];  Service: Cardiovascular;  Laterality: N/A;   ACHILLES TENDON REPAIR Right    ACHILLES TENDON SURGERY Left 02/19/2021   Procedure: ACHILLES TENDON REPAIR WITH GRAFT;  Surgeon: Tobie Franky SQUIBB, DPM;  Location: [facility];  Service: Podiatry;  Laterality: Left;  BLOCK   AMPUTATION TOE Right 05/28/2021   Procedure: AMPUTATION TOE;  Surgeon: Joya Stabs, MD;  Location: [facility] OR;  Service: Podiatry;  Laterality: Right;  Surgical team will do block   AV FISTULA PLACEMENT Left 07/29/2022   Procedure: LEFT ARTERIOVENOUS (AV) FISTULA CREATION;  Surgeon: Sheree Penne Bruckner, MD;  Location: [facility];  Service: Vascular;  Laterality: Left;   AV FISTULA PLACEMENT Right 04/04/2023   Procedure: RIGHT ARM BRACHIOCEPHALIC ARTERIOVENOUS (AV) FISTULA CREATION;  Surgeon: Lanis Fonda FORBES,  MD;  Location: [facility] OR;  Service: Vascular;  Laterality: Right;  With regional block   GASTROC RECESSION EXTREMITY Left 02/19/2021   Procedure: GASTROC RECESSION EXTREMITY;  Surgeon: Tobie Franky SQUIBB, DPM;  Location: [facility];  Service: Podiatry;  Laterality: Left;  Block   INSERTION OF DIALYSIS CATHETER Left 04/20/2023   Procedure: INSERTION OFTUNNELED  DIALYSIS CATHETER;  Surgeon: Lanis Fonda BRAVO, MD;  Location: [facility] OR;  Service: Vascular;  Laterality: Left;   LIGATION OF ARTERIOVENOUS  FISTULA Right 11/17/2023   Procedure: LIGATION OF ARTERIOVENOUS  FISTULA;  Surgeon: Lanis Fonda BRAVO, MD;  Location: [facility] OR;  Service: Vascular;  Laterality: Right;   PERIPHERAL VASCULAR BALLOON ANGIOPLASTY Right 08/16/2023    Procedure: PERIPHERAL VASCULAR BALLOON ANGIOPLASTY;  Surgeon: Melia Lynwood ORN, MD;  Location: [facility];  Service: Cardiovascular;  Laterality: Right;  innominate   REVISON OF ARTERIOVENOUS FISTULA Right 11/17/2023   Procedure: REVISON OF ARTERIOVENOUS FISTULA;  Surgeon: Lanis Fonda BRAVO, MD;  Location[facility] OR;  Service: Vascular;  Laterality: Right;   TOE SURGERY Left 01/2023   TUBAL LIGATION       Current Outpatient Medications:    albuterol  (PROVENTIL ) (2.5 MG/3ML) 0.083% nebulizer solution, Take 2.5 mg by nebulization every 6 (six) hours as needed for wheezing or shortness of breath., Disp: , Rfl:    albuterol  (VENTOLIN  HFA) 108 (90 Base) MCG/ACT inhaler, Inhale 1-2 puffs into the lungs every 6 (six) hours as needed for wheezing or shortness of breath (Asthma)., Disp: , Rfl:    aspirin  81 MG EC tablet, Take 1 tablet (81 mg total) by mouth daily., Disp: 90 tablet, Rfl: 2   Blood Pressure Monitor DEVI, 1 Device by Does not apply route daily., Disp: 1 each, Rfl: 0   calcium  acetate (PHOSLO ) 667 MG capsule, Take 1 capsule (667 mg total) by mouth 3 (three) times daily with meals., Disp: 90 capsule, Rfl: 1   Continuous Blood Gluc Receiver (DEXCOM G7 RECEIVER) DEVI, Use to monitor BG continuously, Disp: 1 each, Rfl: 0   Continuous Glucose Sensor (DEXCOM G7 SENSOR) MISC, CHANGE SENSOR EVERY 10 DAYS, Disp: 3 each, Rfl: 0   fenofibrate  (TRICOR ) 145 MG tablet, Take 145 mg by mouth daily., Disp: , Rfl:    furosemide  (LASIX ) 20 MG tablet, Take 20 mg by mouth daily., Disp: , Rfl:    insulin  aspart protamine - aspart (NOVOLOG  70/30 MIX) (70-30) 100 UNIT/ML FlexPen, Inject 10 Units into the skin 2 (two) times daily with a meal., Disp: , Rfl:    insulin  glargine (LANTUS ) 100 UNIT/ML injection, Inject 10 Units into the skin daily., Disp: , Rfl:    Insulin  Pen Needle 32G X 4 MM MISC, Use to inject insulin  twice daily, Disp: 200 each, Rfl: 2   isosorbide -hydrALAZINE  (BIDIL ) 20-37.5 MG tablet, Take 1 tablet  by mouth 3 (three) times daily., Disp: 270 tablet, Rfl: 1   labetalol  (NORMODYNE ) 300 MG tablet, Take 300 mg by mouth 2 (two) times daily., Disp: , Rfl:    Methoxy PEG-Epoetin  Beta (MIRCERA IJ), Mircera, Disp: , Rfl:    multivitamin (RENA-VIT) TABS tablet, Take 1 tablet by mouth daily., Disp: , Rfl:    NOVOLOG  FLEXPEN 100 UNIT/ML FlexPen, Inject 10-15 Units into the skin at bedtime as needed for high blood sugar., Disp: , Rfl:    rosuvastatin  (CRESTOR ) 40 MG tablet, Take 40 mg by mouth in the morning., Disp: , Rfl:    Semaglutide ,0.25 or 0.5MG /DOS, (OZEMPIC , 0.25 OR 0.5 MG/DOSE,) 2 MG/3ML  SOPN, INJECT 0.25MG  INTO THE SKIN ONE TIME PER WEEK, Disp: 3 mL, Rfl: 2   calcitRIOL  (ROCALTROL ) 0.25 MCG capsule, Take 0.25 mcg by mouth daily. (Patient not taking: Reported on 02/19/2024), Disp: , Rfl:    LOVAZA 1 g capsule, Take 2 g by mouth 2 (two) times daily. (Patient not taking: Reported on 02/19/2024), Disp: , Rfl:    oxyCODONE -acetaminophen  (PERCOCET/ROXICET) 5-325 MG tablet, Take 1 tablet by mouth every 6 (six) hours as needed. (Patient not taking: Reported on 02/19/2024), Disp: 20 tablet, Rfl: 0 Allergies  Allergen Reactions   Bee Pollen Other (See Comments)    Seasonal Allergies   Dust Mite Extract Cough    Sneezing & Cough   Mixed Ragweed Itching    Social History   Tobacco Use   Smoking status: Former    Current packs/day: 0.00    Average packs/day: 0.5 packs/day for 30.0 years (15.0 ttl pk-yrs)    Types: Cigarettes    Start date: 61    Quit date: 2017    Years since quitting: 8.5    Passive exposure: Past   Smokeless tobacco: Never  Substance Use Topics   Alcohol use: Not Currently    Alcohol/week: 0.0 standard drinks of alcohol    Comment: rare    Family History  Problem Relation Age of Onset   Asthma Mother    Diabetes Father    Hypertension Father    Kidney disease Father    Heart attack Father    Heart failure Father    Hypertension Sister    Asthma Sister    Congestive  Heart Failure Maternal Aunt    Diabetes Maternal Grandmother    Congestive Heart Failure Maternal Grandmother    Lung disease Maternal Grandmother    Asthma Other    Hyperlipidemia Other    Hypertension Other    Cancer Other       Review of Systems  Constitutional: negative for fatigue and weight loss Respiratory: negative for cough and wheezing Cardiovascular: negative for chest pain, fatigue and palpitations Gastrointestinal: negative for abdominal pain and change in bowel habits Musculoskeletal:negative for myalgias Neurological: negative for gait problems and tremors Behavioral/Psych: negative for abusive relationship, depression Endocrine: negative for temperature intolerance    Genitourinary:negative for abnormal menstrual periods, genital lesions, hot flashes, sexual problems and vaginal discharge Integument/breast: negative for breast lump, breast tenderness, nipple discharge and skin lesion(s)    Objective:       BP (!) 177/87   Pulse 84   Wt 223 lb 14.4 oz (101.6 kg)   LMP 12/21/2021   BMI 33.06 kg/m  General:   alert  Skin:   no rash or abnormalities  Lungs:   clear to auscultation bilaterally  Heart:   regular rate and rhythm, S1, S2 normal, no murmur, click, rub or gallop  Breasts:   normal without suspicious masses, skin or nipple changes or axillary nodes  Abdomen:  normal findings: no organomegaly, soft, non-tender and no hernia  Pelvis:  External genitalia: normal general appearance Urinary system: urethral meatus normal and bladder without fullness, nontender Vaginal: normal without tenderness, induration or masses Cervix: normal appearance Adnexa: normal bimanual exam Uterus: anteverted and non-tender, normal size   Lab Review Urine pregnancy test Labs reviewed {YES NO:22349} Radiologic studies reviewed {YES NO:22349}  ***% of *** min visit spent on counseling and coordination of care.   Assessment:    Healthy female exam.    Plan:     {plan:19193}   No orders  of the defined types were placed in this encounter.  Orders Placed This Encounter  Procedures   MM 3D DIAGNOSTIC MAMMOGRAM BILATERAL BREAST    INS:MCD PF: 01/03/23 @ [facility] DIAG: ANNUAL - no new probs, per pt INDIV OR FAM HX OF BR CA: NO HX OF BR IMPLANTS OR REDUCTIONS: NO SPECIAL NEEDS: PT USES CANE PT AWARE OF $75 CX/NS FEE SW DR. MARCEIL / SH    Standing Status:   Future    Expiration Date:   02/18/2025    Reason for Exam (SYMPTOM  OR DIAGNOSIS REQUIRED):   Screening    Is the patient pregnant?:   No    Preferred [facility] location[facility]   Hepatitis B surface antigen    Standing Status:   Future    Expiration Date:   02/18/2025   Hepatitis C antibody    Standing Status:   Future    Expiration Date:   02/18/2025   HIV Antibody (routine testing w rflx)    Standing Status:   Future    Expiration Date:   02/18/2025   RPR    Standing Status:   Future    Expiration Date:   02/18/2025     CARLIN RONAL CENTERS, MD, FACOG Attending Obstetrician & Gynecologist, [facility] for [facility], [facility], [place] 02/19/2024

## 2024-02-21 LAB — CERVICOVAGINAL ANCILLARY ONLY
Bacterial Vaginitis (gardnerella): NEGATIVE
Candida Glabrata: NEGATIVE
Candida Vaginitis: NEGATIVE
Chlamydia: NEGATIVE
Comment: NEGATIVE
Comment: NEGATIVE
Comment: NEGATIVE
Comment: NEGATIVE
Comment: NEGATIVE
Comment: NORMAL
Neisseria Gonorrhea: NEGATIVE
Trichomonas: NEGATIVE

## 2024-02-22 LAB — CYTOLOGY - PAP
Comment: NEGATIVE
Diagnosis: NEGATIVE
High risk HPV: NEGATIVE

## 2024-02-27 ENCOUNTER — Encounter: Payer: Self-pay | Admitting: Obstetrics

## 2024-02-29 ENCOUNTER — Encounter

## 2024-03-01 ENCOUNTER — Ambulatory Visit: Admitting: Cardiology

## 2024-03-08 NOTE — Procedures (Signed)
Mask fit

## 2024-03-12 ENCOUNTER — Encounter (HOSPITAL_COMMUNITY): Payer: Self-pay

## 2024-03-12 ENCOUNTER — Ambulatory Visit
Admission: RE | Admit: 2024-03-12 | Discharge: 2024-03-12 | Disposition: A | Discharge status: Home / Self Care | Source: Ambulatory Visit | Attending: Obstetrics | Admitting: Obstetrics

## 2024-03-12 DIAGNOSIS — Z1239 Encounter for other screening for malignant neoplasm of breast: Secondary | ICD-10-CM

## 2024-03-18 ENCOUNTER — Ambulatory Visit: Admitting: Dietician

## 2024-03-18 ENCOUNTER — Telehealth: Payer: Self-pay

## 2024-03-18 ENCOUNTER — Other Ambulatory Visit (HOSPITAL_COMMUNITY): Payer: Self-pay

## 2024-03-18 NOTE — Telephone Encounter (Signed)
 Pharmacy Patient Advocate Encounter   Received notification from CoverMyMeds that prior authorization for Ozempic  (0.25 or 0.5 MG/DOSE) 2MG /3ML pen-injectors is required/requested.   Insurance verification completed.   The patient is insured through [facility] .   Per test claim: The current 30 day co-pay is, $4.  No PA needed at this time. This test claim was processed through [facility]- copay amounts may vary at other pharmacies due to pharmacy/plan contracts, or as the patient moves through the different stages of their insurance plan.

## 2024-03-26 ENCOUNTER — Ambulatory Visit

## 2024-03-26 ENCOUNTER — Encounter: Payer: Self-pay | Admitting: Cardiology

## 2024-03-26 ENCOUNTER — Other Ambulatory Visit (HOSPITAL_COMMUNITY): Payer: Self-pay

## 2024-03-26 ENCOUNTER — Ambulatory Visit: Attending: Cardiology | Admitting: Cardiology

## 2024-03-26 VITALS — BP 176/90 | HR 88 | Ht 69.0 in | Wt 223.6 lb

## 2024-03-26 DIAGNOSIS — I10 Essential (primary) hypertension: Secondary | ICD-10-CM | POA: Diagnosis present

## 2024-03-26 DIAGNOSIS — I480 Paroxysmal atrial fibrillation: Secondary | ICD-10-CM

## 2024-03-26 MED ORDER — LABETALOL HCL 300 MG PO TABS
300.0000 mg | ORAL_TABLET | Freq: Two times a day (BID) | ORAL | 3 refills | Status: AC
  Filled 2024-03-26: qty 180, 90d supply, fill #0

## 2024-03-26 MED ORDER — APIXABAN 5 MG PO TABS
5.0000 mg | ORAL_TABLET | Freq: Two times a day (BID) | ORAL | 3 refills | Status: AC
  Filled 2024-03-26: qty 180, 90d supply, fill #0

## 2024-03-26 MED ORDER — DILTIAZEM HCL 30 MG PO TABS
30.0000 mg | ORAL_TABLET | Freq: Three times a day (TID) | ORAL | 1 refills | Status: AC | PRN
  Filled 2024-03-26: qty 90, 30d supply, fill #0

## 2024-03-26 NOTE — Progress Notes (Unsigned)
 ZIO serial @ [id number] from office inventory applied to patient.

## 2024-03-26 NOTE — Progress Notes (Addendum)
 Cardiology Office Note:  .   Date:  03/26/2024  ID:  [name], DOB 1971-05-17, MRN [id number] PCP: Catalina Bare, MD  [facility] Providers Cardiologist:  Newman Lawrence, MD PCP: Catalina Bare, MD  Chief Complaint  Patient presents with   Atrial Fibrillation      History of Present Illness: .    [name] is a 53 y.o. female with hypertension, hyperlipidemia, uncontrolled type 2 diabetes mellitus, ESRD on HD, PAF, tobacco abuse, recurrent strokes.  Recently, patient was in [place]  on vacation.  She was getting dialysis at the dialysis center there.  She tells me that she was on phone with the children with a stressful conversation, which was followed by palpitations with rapid heartbeat.  She was reportedly taken off dialysis and sent to [facility] where she was diagnosed with A-fib.  EKG at [facility] shown by patient to me on her phone, added under media.  I do not have the detailed records.  It appears that she left from the hospital there.  She was seen by PCP subsequently.  She was prescribed Eliquis  5 mg twice daily, but was reportedly reduced by pharmacy to 2.5 mg twice daily given her renal function.  She has not had any bleeding issues.  It also appears that she was taken off labetalol  when she was started on Eliquis .  Blood pressure is elevated today.  Patient reportedly had at least 1 more episode of palpitations about a week or so ago, it was self-limiting.  Separately, she has been having issues with the dialysis access.  She has a nonfunctional dialysis access in the left upper arm, has a right upper arm AV fistula which is also having some issues with the flow.  Her nephrologist is working on the same.   Vitals:   03/26/24 0819  BP: (!) 176/90  Pulse: 88  SpO2: 98%      ROS:  Review of Systems  Cardiovascular:  Negative for chest pain, dyspnea on exertion, leg swelling, palpitations and syncope.      Studies Reviewed: [name]   EKG Interpretation Date/Time:  Tuesday March 26 2024 08:26:48 EDT Ventricular Rate:  81 PR Interval:  188 QRS Duration:  80 QT Interval:  404 QTC Calculation: 469 R Axis:   83  Text Interpretation: EKG 03/26/2024: Sinus rhythm with Premature atrial complexes with Abberant conduction T wave abnormality, consider inferior ischemia Prolonged QT When compared with ECG of 27-Aug-2023 14:14, Abberant conduction is now Present Criteria for Septal infarct are no longer Present T wave inversion now evident in Inferior leads Nonspecific T wave abnormality, worse in Anterolateral leads Confirmed by Lawrence Newman [id number]) on 03/26/2024 8:39:58 AM    EKG 03/26/2024: Sinus rhythm with Premature atrial complexes with Abberant conduction T wave abnormality, consider inferior ischemia Prolonged QT When compared with ECG of 27-Aug-2023 14:14, Abberant conduction is now Present Criteria for Septal infarct are no longer Present T wave inversion now evident in Inferior leads Nonspecific T wave abnormality, worse in Anterolateral leads     Labs 03/2023-11/2023: HbA1C 7.4% Hb 12.6 Cr 7.1, K 3.5  04/2023: Chol 120, TG 127, HDL 39, LDL 60 HbA1C 7.6%   Physical Exam:   Physical Exam Vitals and nursing note reviewed.  Constitutional:      General: She is not in acute distress. Neck:     Vascular: No JVD.  Cardiovascular:     Rate and Rhythm: Normal rate and regular rhythm.  Heart sounds: Normal heart sounds. No murmur heard. Pulmonary:     Effort: Pulmonary effort is normal.     Breath sounds: Normal breath sounds. No wheezing or rales.  Musculoskeletal:     Right lower leg: No edema.     Left lower leg: No edema.     Risk Assessment/Calculations:    CHA2DS2-VASc Score = 5   This indicates a 7.2% annual risk of stroke. The patient's score is based upon: CHF History: 0 HTN History: 1 Diabetes History: 1 Stroke History: 2 Vascular Disease History: 0 Age  Score: 0 Gender Score: 1     Media Information     VISIT DIAGNOSES:   ICD-10-CM   1. Essential hypertension, benign  I10 EKG 12-Lead    2. PAF (paroxysmal atrial fibrillation) (HCC)  I48.0 LONG TERM MONITOR (3-14 DAYS)    ECHOCARDIOGRAM COMPLETE        ASSESSMENT AND PLAN: .    [name] is a 53 y.o. female with hypertension, hyperlipidemia, uncontrolled type 2 diabetes mellitus, ESRD on HD, PAF, tobacco abuse, recurrent strokes.  PAF: Documented EKG from [place]  under media tab. With age <80 years, weight >60 EKG, she should be on Eliquis  5 mg twice daily in spite of her renal dysfunction. New prescription sent to the pharmacy. Also, resume labetalol  300 mg twice daily which would help with both rate control as well as hypertension. Patient has OSA, and is not using CPAP.  Highly encouraged using CPAP regularly.  Hypertension: Continue BiDil  20/37.5 mg 3 times daily. Resume labetalol  100 mg twice daily.  H/o stroke: With new diagnosis of A-fib and initiation of Eliquis , I will discontinue aspirin  to reduce bleeding risk. Continue rosuvastatin  40 mg daily.  Type 2 diabetes mellitus: Continue f/u w/Dr. Lenis.      F/u in 1 year  Signed, Newman [name], MD

## 2024-03-26 NOTE — Patient Instructions (Signed)
 Medication Instructions:  STOP Aspirin  81 mg   RESTART Labetalol  300 mg twice daily   START Diltiazem  30 mg three times a day as needed   INCREASED Eliquis  to 5 mg twice daily   *If you need a refill on your cardiac medications before your next appointment, please call your pharmacy*  Testing/Procedures: Echo  Your physician has requested that you have an echocardiogram. Echocardiography is a painless test that uses sound waves to create images of your heart. It provides your doctor with information about the size and shape of your heart and how well your heart's chambers and valves are working. This procedure takes approximately one hour. There are no restrictions for this procedure. Please do NOT wear cologne, perfume, aftershave, or lotions (deodorant is allowed). Please arrive 15 minutes prior to your appointment time.  Please note: We ask at that you not bring children with you during ultrasound (echo/ vascular) testing. Due to room size and safety concerns, children are not allowed in the ultrasound rooms during exams. Our front office staff cannot provide observation of children in our lobby area while testing is being conducted. An adult accompanying a patient to their appointment will only be allowed in the ultrasound room at the discretion of the ultrasound technician under special circumstances. We apologize for any inconvenience.  2 week ZIO monitor   Your physician has requested that you wear a Zio heart monitor for __14___ days. This will be mailed to your home with instructions on how to apply the monitor and how to return it when finished. Please allow 2 weeks after returning the heart monitor before our office calls you with the results.   Follow-Up: At [facility], you and your health needs are our priority.  As part of our continuing mission to provide you with exceptional heart care, our providers are all part of one team.  This team includes your primary  Cardiologist (physician) and Advanced Practice Providers or APPs (Physician Assistants and Nurse Practitioners) who all work together to provide you with the care you need, when you need it.  Your next appointment:   3 month(s)  Provider:   Newman [name], MD

## 2024-04-09 ENCOUNTER — Telehealth: Payer: Self-pay

## 2024-04-09 NOTE — Telephone Encounter (Signed)
   Pre-operative Risk Assessment    Patient Name: [name]  DOB: [birth year] MRN: [id number]   Date of last office visit: 03/26/24 Date of next office visit: 06/27/24   Request for Surgical Clearance    Procedure:  Colonoscopy   Date of Surgery:  Clearance 06/04/24                                 Surgeon:  Dr. Marvis  Surgeon's Group or Practice Name:  [facility]  Phone number:  [phone] Fax number:  [phone]   Type of Clearance Requested:   - Medical  - Pharmacy:  Hold Apixaban  (Eliquis ) 2 days prior    Type of Anesthesia:  Not Indicated   Additional requests/questions:    Bonney Rebeca Blight   04/09/2024, 1:58 PM

## 2024-04-11 NOTE — Telephone Encounter (Signed)
 Dr. Elmira,   You saw this patient on 03/26/2024. Per office protocol, will you please comment on medical clearance for colonoscopy on 10/28?  Please route your response to P CV DIV Preop. I will communicate with requesting office once you have given recommendations.   Thank you!  Barnie Hila, NP

## 2024-04-11 NOTE — Telephone Encounter (Signed)
   Name: [name]  DOB: Dec 17, 1970  MRN: [id number]   Primary Cardiologist: Newman [name], MD  Chart reviewed as part of pre-operative protocol coverage. [name] was last seen on 03/26/2024 by Dr. Lawrence.  Per Dr. Lawrence, No cardiac risk. Okay to hold Eliquis  up to 2 days before the procedure, if needed. Resume as soon as possible after the procedure.  Therefore, based on ACC/AHA guidelines, the patient would be an acceptable risk for the planned procedure without further cardiovascular testing.   Per Pharm D, patient has not had an Afib/aflutter ablation within the last 3 months or DCCV within the last 30 days. Patient may hold Eliquis  for 2 days prior to procedure.    I will route this recommendation to the requesting party via Epic fax function and remove from pre-op  pool. Please call with questions.  Barnie Hila, NP 04/11/2024, 1:27 PM

## 2024-04-11 NOTE — Telephone Encounter (Signed)
 No cardiac risk. Okay to hold Eliquis  up to 2 days before the procedure, if needed. Resume as soon as possible after the procedure.  Thanks MJP

## 2024-04-11 NOTE — Telephone Encounter (Signed)
 Patient with diagnosis of atrial fibrillation on Eliquis  for anticoagulation.    Procedure:  Colonoscopy    Date of Surgery:  Clearance 06/04/24    CHA2DS2-VASc Score = 5   This indicates a 7.2% annual risk of stroke. The patient's score is based upon: CHF History: 0 HTN History: 1 Diabetes History: 1 Stroke History: 2 Vascular Disease History: 0 Age Score: 0 Gender Score: 1  Chart indicates stroke in 2020  CrCl ESRD on dialysis Platelet count 206  Patient has not had an Afib/aflutter ablation within the last 3 months or DCCV within the last 30 days  Per office protocol, patient can hold Eliquis  for 2 days prior to procedure.   Patient will not need bridging with Lovenox  (enoxaparin ) around procedure.  **This guidance is not considered finalized until pre-operative APP has relayed final recommendations.**

## 2024-04-15 ENCOUNTER — Other Ambulatory Visit: Payer: Self-pay

## 2024-04-15 ENCOUNTER — Observation Stay (HOSPITAL_COMMUNITY)
Admission: EM | Admit: 2024-04-15 | Discharge: 2024-04-18 | Disposition: A | Discharge status: Home / Self Care | Attending: Internal Medicine | Admitting: Internal Medicine

## 2024-04-15 ENCOUNTER — Encounter (HOSPITAL_COMMUNITY): Payer: Self-pay

## 2024-04-15 DIAGNOSIS — Z6831 Body mass index (BMI) 31.0-31.9, adult: Secondary | ICD-10-CM | POA: Diagnosis not present

## 2024-04-15 DIAGNOSIS — D631 Anemia in chronic kidney disease: Secondary | ICD-10-CM | POA: Diagnosis not present

## 2024-04-15 DIAGNOSIS — I5032 Chronic diastolic (congestive) heart failure: Secondary | ICD-10-CM | POA: Insufficient documentation

## 2024-04-15 DIAGNOSIS — Z7901 Long term (current) use of anticoagulants: Secondary | ICD-10-CM | POA: Insufficient documentation

## 2024-04-15 DIAGNOSIS — J45909 Unspecified asthma, uncomplicated: Secondary | ICD-10-CM | POA: Insufficient documentation

## 2024-04-15 DIAGNOSIS — I132 Hypertensive heart and chronic kidney disease with heart failure and with stage 5 chronic kidney disease, or end stage renal disease: Secondary | ICD-10-CM | POA: Diagnosis not present

## 2024-04-15 DIAGNOSIS — N186 End stage renal disease: Secondary | ICD-10-CM | POA: Insufficient documentation

## 2024-04-15 DIAGNOSIS — Y832 Surgical operation with anastomosis, bypass or graft as the cause of abnormal reaction of the patient, or of later complication, without mention of misadventure at the time of the procedure: Secondary | ICD-10-CM | POA: Insufficient documentation

## 2024-04-15 DIAGNOSIS — I10 Essential (primary) hypertension: Secondary | ICD-10-CM | POA: Diagnosis present

## 2024-04-15 DIAGNOSIS — Z794 Long term (current) use of insulin: Secondary | ICD-10-CM | POA: Insufficient documentation

## 2024-04-15 DIAGNOSIS — Z79899 Other long term (current) drug therapy: Secondary | ICD-10-CM | POA: Diagnosis not present

## 2024-04-15 DIAGNOSIS — E1165 Type 2 diabetes mellitus with hyperglycemia: Secondary | ICD-10-CM | POA: Insufficient documentation

## 2024-04-15 DIAGNOSIS — Z8673 Personal history of transient ischemic attack (TIA), and cerebral infarction without residual deficits: Secondary | ICD-10-CM | POA: Diagnosis not present

## 2024-04-15 DIAGNOSIS — Z992 Dependence on renal dialysis: Secondary | ICD-10-CM | POA: Diagnosis not present

## 2024-04-15 DIAGNOSIS — T82898A Other specified complication of vascular prosthetic devices, implants and grafts, initial encounter: Secondary | ICD-10-CM | POA: Insufficient documentation

## 2024-04-15 DIAGNOSIS — I160 Hypertensive urgency: Secondary | ICD-10-CM | POA: Diagnosis not present

## 2024-04-15 DIAGNOSIS — I4891 Unspecified atrial fibrillation: Secondary | ICD-10-CM | POA: Diagnosis not present

## 2024-04-15 DIAGNOSIS — I639 Cerebral infarction, unspecified: Secondary | ICD-10-CM | POA: Diagnosis present

## 2024-04-15 DIAGNOSIS — D638 Anemia in other chronic diseases classified elsewhere: Secondary | ICD-10-CM | POA: Diagnosis present

## 2024-04-15 DIAGNOSIS — Z87891 Personal history of nicotine dependence: Secondary | ICD-10-CM | POA: Diagnosis not present

## 2024-04-15 DIAGNOSIS — E1151 Type 2 diabetes mellitus with diabetic peripheral angiopathy without gangrene: Secondary | ICD-10-CM | POA: Diagnosis not present

## 2024-04-15 DIAGNOSIS — E1122 Type 2 diabetes mellitus with diabetic chronic kidney disease: Secondary | ICD-10-CM | POA: Diagnosis not present

## 2024-04-15 DIAGNOSIS — E782 Mixed hyperlipidemia: Secondary | ICD-10-CM | POA: Insufficient documentation

## 2024-04-15 DIAGNOSIS — E66811 Obesity, class 1: Secondary | ICD-10-CM | POA: Insufficient documentation

## 2024-04-15 DIAGNOSIS — G4733 Obstructive sleep apnea (adult) (pediatric): Secondary | ICD-10-CM | POA: Insufficient documentation

## 2024-04-15 DIAGNOSIS — T82590A Other mechanical complication of surgically created arteriovenous fistula, initial encounter: Principal | ICD-10-CM

## 2024-04-15 DIAGNOSIS — T82868A Thrombosis of vascular prosthetic devices, implants and grafts, initial encounter: Principal | ICD-10-CM | POA: Insufficient documentation

## 2024-04-15 DIAGNOSIS — T8249XA Other complication of vascular dialysis catheter, initial encounter: Secondary | ICD-10-CM | POA: Diagnosis present

## 2024-04-15 LAB — I-STAT CHEM 8, ED
BUN: 75 mg/dL — ABNORMAL HIGH (ref 6–20)
Calcium, Ion: 0.99 mmol/L — ABNORMAL LOW (ref 1.15–1.40)
Chloride: 102 mmol/L (ref 98–111)
Creatinine, Ser: 10.6 mg/dL — ABNORMAL HIGH (ref 0.44–1.00)
Glucose, Bld: 187 mg/dL — ABNORMAL HIGH (ref 70–99)
HCT: 30 % — ABNORMAL LOW (ref 36.0–46.0)
Hemoglobin: 10.2 g/dL — ABNORMAL LOW (ref 12.0–15.0)
Potassium: 3.9 mmol/L (ref 3.5–5.1)
Sodium: 137 mmol/L (ref 135–145)
TCO2: 25 mmol/L (ref 22–32)

## 2024-04-15 LAB — COMPREHENSIVE METABOLIC PANEL WITH GFR
ALT: 12 U/L (ref 0–44)
AST: 15 U/L (ref 15–41)
Albumin: 3.4 g/dL — ABNORMAL LOW (ref 3.5–5.0)
Alkaline Phosphatase: 37 U/L — ABNORMAL LOW (ref 38–126)
Anion gap: 17 — ABNORMAL HIGH (ref 5–15)
BUN: 71 mg/dL — ABNORMAL HIGH (ref 6–20)
CO2: 22 mmol/L (ref 22–32)
Calcium: 8.5 mg/dL — ABNORMAL LOW (ref 8.9–10.3)
Chloride: 100 mmol/L (ref 98–111)
Creatinine, Ser: 9.4 mg/dL — ABNORMAL HIGH (ref 0.44–1.00)
GFR, Estimated: 5 mL/min — ABNORMAL LOW (ref >60)
Glucose, Bld: 191 mg/dL — ABNORMAL HIGH (ref 70–99)
Potassium: 3.9 mmol/L (ref 3.5–5.1)
Sodium: 139 mmol/L (ref 135–145)
Total Bilirubin: 0.5 mg/dL (ref 0.0–1.2)
Total Protein: 7.3 g/dL (ref 6.5–8.1)

## 2024-04-15 LAB — CBC
HCT: 30.1 % — ABNORMAL LOW (ref 36.0–46.0)
Hemoglobin: 9.9 g/dL — ABNORMAL LOW (ref 12.0–15.0)
MCH: 33.7 pg (ref 26.0–34.0)
MCHC: 32.9 g/dL (ref 30.0–36.0)
MCV: 102.4 fL — ABNORMAL HIGH (ref 80.0–100.0)
Platelets: 289 K/uL (ref 150–400)
RBC: 2.94 MIL/uL — ABNORMAL LOW (ref 3.87–5.11)
RDW: 17.7 % — ABNORMAL HIGH (ref 11.5–15.5)
WBC: 7.7 K/uL (ref 4.0–10.5)
nRBC: 0.4 % — ABNORMAL HIGH (ref 0.0–0.2)

## 2024-04-15 LAB — HEMOGLOBIN A1C
Hgb A1c MFr Bld: 7.9 % — ABNORMAL HIGH (ref 4.8–5.6)
Mean Plasma Glucose: 180.03 mg/dL

## 2024-04-15 LAB — GLUCOSE, CAPILLARY: Glucose-Capillary: 199 mg/dL — ABNORMAL HIGH (ref 70–99)

## 2024-04-15 LAB — CREATININE, SERUM
Creatinine, Ser: 9.85 mg/dL — ABNORMAL HIGH (ref 0.44–1.00)
GFR, Estimated: 4 mL/min — ABNORMAL LOW (ref >60)

## 2024-04-15 MED ORDER — ALBUTEROL SULFATE HFA 108 (90 BASE) MCG/ACT IN AERS: 1.0000 | INHALATION_SPRAY | Freq: Four times a day (QID) | RESPIRATORY_TRACT | Status: DC | PRN

## 2024-04-15 MED ORDER — HYDRALAZINE HCL 20 MG/ML IJ SOLN: 5.0000 mg | INTRAMUSCULAR | Status: DC | PRN

## 2024-04-15 MED ORDER — FUROSEMIDE 20 MG PO TABS
20.0000 mg | ORAL_TABLET | Freq: Every day | ORAL | Status: DC
  Administered 2024-04-15 – 2024-04-18 (×4): 20 mg via ORAL
  Filled 2024-04-15 (×4): qty 1

## 2024-04-15 MED ORDER — ISOSORB DINITRATE-HYDRALAZINE 20-37.5 MG PO TABS
1.0000 | ORAL_TABLET | Freq: Three times a day (TID) | ORAL | Status: DC
  Administered 2024-04-15 – 2024-04-18 (×7): 1 via ORAL
  Filled 2024-04-15 (×7): qty 1

## 2024-04-15 MED ORDER — ACETAMINOPHEN 650 MG RE SUPP: 650.0000 mg | Freq: Four times a day (QID) | RECTAL | Status: DC | PRN

## 2024-04-15 MED ORDER — INSULIN GLARGINE 100 UNIT/ML ~~LOC~~ SOLN
5.0000 [IU] | Freq: Every day | SUBCUTANEOUS | Status: DC
  Administered 2024-04-17 – 2024-04-18 (×2): 5 [IU] via SUBCUTANEOUS
  Filled 2024-04-15 (×3): qty 0.05

## 2024-04-15 MED ORDER — LABETALOL HCL 200 MG PO TABS
300.0000 mg | ORAL_TABLET | Freq: Two times a day (BID) | ORAL | Status: DC
  Administered 2024-04-15 – 2024-04-18 (×4): 300 mg via ORAL
  Filled 2024-04-15 (×4): qty 2

## 2024-04-15 MED ORDER — INSULIN ASPART 100 UNIT/ML IJ SOLN
0.0000 [IU] | Freq: Every day | INTRAMUSCULAR | Status: DC
  Administered 2024-04-16: 3 [IU] via SUBCUTANEOUS

## 2024-04-15 MED ORDER — ALBUTEROL SULFATE (2.5 MG/3ML) 0.083% IN NEBU: 2.5000 mg | INHALATION_SOLUTION | Freq: Four times a day (QID) | RESPIRATORY_TRACT | Status: DC | PRN

## 2024-04-15 MED ORDER — ACETAMINOPHEN 325 MG PO TABS
650.0000 mg | ORAL_TABLET | Freq: Four times a day (QID) | ORAL | Status: DC | PRN
  Administered 2024-04-18: 650 mg via ORAL
  Filled 2024-04-15: qty 2

## 2024-04-15 MED ORDER — FENOFIBRATE 54 MG PO TABS
54.0000 mg | ORAL_TABLET | Freq: Every day | ORAL | Status: DC
  Administered 2024-04-16 – 2024-04-18 (×3): 54 mg via ORAL
  Filled 2024-04-15 (×5): qty 1

## 2024-04-15 MED ORDER — INSULIN ASPART 100 UNIT/ML IJ SOLN
0.0000 [IU] | Freq: Three times a day (TID) | INTRAMUSCULAR | Status: DC
  Administered 2024-04-16: 1 [IU] via SUBCUTANEOUS
  Administered 2024-04-16: 3 [IU] via SUBCUTANEOUS
  Administered 2024-04-17: 2 [IU] via SUBCUTANEOUS
  Administered 2024-04-17: 1 [IU] via SUBCUTANEOUS
  Administered 2024-04-18: 2 [IU] via SUBCUTANEOUS

## 2024-04-15 MED ORDER — HEPARIN SODIUM (PORCINE) 5000 UNIT/ML IJ SOLN
5000.0000 [IU] | Freq: Three times a day (TID) | INTRAMUSCULAR | Status: DC
  Administered 2024-04-15: 5000 [IU] via SUBCUTANEOUS
  Filled 2024-04-15: qty 1

## 2024-04-15 NOTE — ED Provider Notes (Signed)
 [place] EMERGENCY DEPARTMENT AT [facility] Provider Note   CSN: [id number] Arrival date & time: 04/15/24  1319     Patient presents with: Vascular Access Problem   [name] is a 53 y.o. female.   53 year old female presents for evaluation of AV fistula malfunction.  Was sent here by dialysis.  She has a follow-up with vascular surgery on Thursday.  States this happens quite often but usually the dialysis center is able to unclog it and still run her dialysis but today they were unable to.  She denies any other symptoms or concerns.        Prior to Admission medications   Medication Sig Start Date End Date Taking? Authorizing Provider  albuterol  (PROVENTIL ) (2.5 MG/3ML) 0.083% nebulizer solution Take 2.5 mg by nebulization every 6 (six) hours as needed for wheezing or shortness of breath. 08/10/18   [provider]  albuterol  (VENTOLIN  HFA) 108 (90 Base) MCG/ACT inhaler Inhale 1-2 puffs into the lungs every 6 (six) hours as needed for wheezing or shortness of breath (Asthma).    [provider]  apixaban  (ELIQUIS ) 5 MG TABS tablet Take 1 tablet (5 mg total) by mouth 2 (two) times daily. 03/26/24   Patwardhan, Newman PARAS, MD  Blood Pressure Monitor DEVI 1 Device by Does not apply route daily. 07/17/23   Patwardhan, Newman PARAS, MD  calcium  acetate (PHOSLO ) 667 MG capsule Take 1 capsule (667 mg total) by mouth 3 (three) times daily with meals. 04/22/23   Fairy Frames, MD  Continuous Blood Gluc Receiver (DEXCOM G7 RECEIVER) DEVI Use to monitor BG continuously 04/13/22   Nida, Gebreselassie W, MD  Continuous Glucose Sensor (DEXCOM G7 SENSOR) MISC CHANGE SENSOR EVERY 10 DAYS 12/01/23   Nida, Gebreselassie W, MD  diltiazem  (CARDIZEM ) 30 MG tablet Take 1 tablet (30 mg total) by mouth 3 (three) times daily as needed. 03/26/24   Patwardhan, Newman PARAS, MD  fenofibrate  (TRICOR ) 145 MG tablet Take 145 mg by mouth daily. 08/23/23   [provider]  furosemide   (LASIX ) 20 MG tablet Take 20 mg by mouth daily.    [provider]  insulin  aspart protamine - aspart (NOVOLOG  70/30 MIX) (70-30) 100 UNIT/ML FlexPen Inject 10 Units into the skin 2 (two) times daily with a meal. 04/22/23   Fairy Frames, MD  insulin  glargine (LANTUS ) 100 UNIT/ML injection Inject 10 Units into the skin daily.    [provider]  Insulin  Pen Needle 32G X 4 MM MISC Use to inject insulin  twice daily 01/23/23   Nida, Gebreselassie W, MD  isosorbide -hydrALAZINE  (BIDIL ) 20-37.5 MG tablet Take 1 tablet by mouth 3 (three) times daily. 04/23/22   Gonfa, Taye T, MD  labetalol  (NORMODYNE ) 300 MG tablet Take 1 tablet (300 mg total) by mouth 2 (two) times daily. 03/26/24   Patwardhan, Newman PARAS, MD  LOVAZA 1 g capsule Take 2 g by mouth 2 (two) times daily. Patient not taking: Reported on 03/26/2024    [provider]  Methoxy PEG-Epoetin  Beta (MIRCERA IJ) Mircera Patient not taking: Reported on 03/26/2024 07/18/23 07/16/24  [provider]  multivitamin (RENA-VIT) TABS tablet Take 1 tablet by mouth daily. Patient not taking: Reported on 03/26/2024    [provider]  NOVOLOG  FLEXPEN 100 UNIT/ML FlexPen Inject 10-15 Units into the skin at bedtime as needed for high blood sugar. 08/23/23   [provider]  oxyCODONE -acetaminophen  (PERCOCET/ROXICET) 5-325 MG tablet Take 1 tablet by mouth every 6 (six) hours as needed.  Patient not taking: Reported on 03/26/2024 11/17/23   Gerome Maurilio HERO, PA-C  rosuvastatin  (CRESTOR ) 40 MG tablet Take 40 mg by mouth in the morning.    [provider]  Semaglutide ,0.25 or 0.5MG /DOS, (OZEMPIC , 0.25 OR 0.5 MG/DOSE,) 2 MG/3ML SOPN INJECT 0.25MG  INTO THE SKIN ONE TIME PER WEEK Patient not taking: Reported on 03/26/2024 07/31/23   Lenis Ethelle ORN, MD    Allergies: Bee pollen, Dust mite extract, and Mixed ragweed    Review of Systems  Constitutional:  Negative for chills and fever.  HENT:  Negative for ear  pain and sore throat.   Eyes:  Negative for pain and visual disturbance.  Respiratory:  Negative for cough and shortness of breath.   Cardiovascular:  Negative for chest pain and palpitations.  Gastrointestinal:  Negative for abdominal pain and vomiting.  Genitourinary:  Negative for dysuria and hematuria.  Musculoskeletal:  Negative for arthralgias and back pain.  Skin:  Negative for color change and rash.  Neurological:  Negative for seizures and syncope.  All other systems reviewed and are negative.   Updated Vital Signs BP (!) 198/92 (BP Location: Left Arm)   Pulse 76   Temp 98 F (36.7 C) (Oral)   Resp 18   Ht 5' 9 (1.753 m)   Wt 98 kg   LMP 12/21/2021   SpO2 100%   BMI 31.91 kg/m   Physical Exam Vitals and nursing note reviewed.  Constitutional:      General: She is not in acute distress.    Appearance: She is well-developed. She is not ill-appearing.  HENT:     Head: Normocephalic and atraumatic.  Eyes:     Conjunctiva/sclera: Conjunctivae normal.  Cardiovascular:     Rate and Rhythm: Normal rate and regular rhythm.     Heart sounds: No murmur heard. Pulmonary:     Effort: Pulmonary effort is normal. No respiratory distress.     Breath sounds: Normal breath sounds.  Abdominal:     Palpations: Abdomen is soft.     Tenderness: There is no abdominal tenderness.  Musculoskeletal:        General: No swelling.     Cervical back: Neck supple.     Comments: Extremity fistula is tender to touch and warm, there is no thrill  Skin:    General: Skin is warm and dry.     Capillary Refill: Capillary refill takes less than 2 seconds.  Neurological:     Mental Status: She is alert.  Psychiatric:        Mood and Affect: Mood normal.     (all labs ordered are listed, but only abnormal results are displayed) Labs Reviewed  COMPREHENSIVE METABOLIC PANEL WITH GFR - Abnormal; Notable for the following components:      Result Value   Glucose, Bld 191 (*)    BUN 71 (*)     Creatinine, Ser 9.40 (*)    Calcium  8.5 (*)    Albumin 3.4 (*)    Alkaline Phosphatase 37 (*)    GFR, Estimated 5 (*)    Anion gap 17 (*)    All other components within normal limits  CBC - Abnormal; Notable for the following components:   RBC 2.94 (*)    Hemoglobin 9.9 (*)    HCT 30.1 (*)    MCV 102.4 (*)    RDW 17.7 (*)    nRBC 0.4 (*)    All other components within normal limits  HEMOGLOBIN A1C - Abnormal; Notable  for the following components:   Hgb A1c MFr Bld 7.9 (*)    All other components within normal limits  CREATININE, SERUM - Abnormal; Notable for the following components:   Creatinine, Ser 9.85 (*)    GFR, Estimated 4 (*)    All other components within normal limits  I-STAT CHEM 8, ED - Abnormal; Notable for the following components:   BUN 75 (*)    Creatinine, Ser 10.60 (*)    Glucose, Bld 187 (*)    Calcium , Ion 0.99 (*)    Hemoglobin 10.2 (*)    HCT 30.0 (*)    All other components within normal limits  BASIC METABOLIC PANEL WITH GFR  CBC    EKG: EKG Interpretation Date/Time:  Monday April 15 2024 13:48:34 EDT Ventricular Rate:  73 PR Interval:  196 QRS Duration:  84 QT Interval:  442 QTC Calculation: 486 R Axis:   47  Text Interpretation: Normal sinus rhythm Nonspecific T wave abnormality Prolonged QT  Compared with prior EKG from 03/26/2024 Confirmed by Gennaro Bouchard ([id number]) on 04/15/2024 4:08:28 PM  Radiology: No results found.   Procedures   Medications Ordered in the ED  insulin  aspart (novoLOG ) injection 0-6 Units (has no administration in time range)  insulin  aspart (novoLOG ) injection 0-5 Units (has no administration in time range)  acetaminophen  (TYLENOL ) tablet 650 mg (has no administration in time range)    Or  acetaminophen  (TYLENOL ) suppository 650 mg (has no administration in time range)  albuterol  (PROVENTIL ) (2.5 MG/3ML) 0.083% nebulizer solution 2.5 mg (has no administration in time range)  fenofibrate  tablet 54 mg (has  no administration in time range)  furosemide  (LASIX ) tablet 20 mg (20 mg Oral Given 04/15/24 1837)  insulin  glargine (LANTUS ) injection 5 Units (has no administration in time range)  isosorbide -hydrALAZINE  (BIDIL ) 20-37.5 MG per tablet 1 tablet (1 tablet Oral Given 04/15/24 1837)  labetalol  (NORMODYNE ) tablet 300 mg (300 mg Oral Given 04/15/24 1837)  hydrALAZINE  (APRESOLINE ) injection 5 mg (has no administration in time range)  heparin  injection 5,000 Units (has no administration in time range)                                    Medical Decision Making Cardiac monitor interpretation: Sinus rhythm, no ectopy  Patient here for AV shunt malfunction at dialysis today.  Has a follow-up with vascular surgery on Thursday but they were unable to get her an appointment day so dialysis team sent her to the ER.  I spoke with nephrology and hospitalist and will admit patient for observation for further workup and management and hopefully IR declotting tomorrow.  [name] lab workup was baseline for her without any acute abnormality.  She is otherwise stable and agrees with plan for admission.  Problems Addressed: ESRD (end stage renal disease) [facility]): chronic illness or injury Malfunction of arteriovenous dialysis fistula, initial encounter [facility]): acute illness or injury that poses a threat to life or bodily functions  Amount and/or Complexity of Data Reviewed External Data Reviewed: notes.    Details: Outpatient records reviewed and patient has a follow-up with vascular surgery in 3 days Labs: ordered. Decision-making details documented in ED Course.    Details: Ordered and reviewed and creatinine other labs like hemoglobin at baseline Discussion of management or test interpretation with external provider(s): Dr. Mennie - hospitalist, he will admit the patient for further workup and managmenet  Dr. Norine - nephrology -  recommended admission to the hospitalist service. Nephrology will consult but  recommended IR for declotting tomorrow   Risk OTC drugs. Prescription drug management. Decision regarding hospitalization.     Final diagnoses:  Malfunction of arteriovenous dialysis fistula, initial encounter (HCC)  ESRD (end stage renal disease) [facility])    ED Discharge Orders     None          Gennaro Duwaine CROME, DO 04/15/24 2024

## 2024-04-15 NOTE — ED Triage Notes (Signed)
 Pts last dialysis was Friday. Pt went today and they said her fistula was clotted. Pt has an appointment with vascular surgery on Wednesday but was told to come here for dialysis. Pt denies any complaints. Pt reports some anxiety because this has never happened before

## 2024-04-15 NOTE — H&P (Signed)
 History and Physical    [name] FMW:[id number] DOB: 07-01-1971 DOA: 04/15/2024  PCP: Teresa Bare, [name]   Patient coming from: HD center Chief Complaint  Patient presents with   Vascular Access Problem   [name] is a 53 y.o. year old female with PMH of hypertension, hyperlipidemia, obesity, DM, PVD Asthma, atrial fibrillation on Eliquis  , multiple strokes ESRD ON hd, who has been having dialysis access issues sent to the emergency room from dialysis as they could not access the HD graft.  Her last dialysis Friday- she went for dialysis today but they could not acces her fistula to sent to ED. Patient otherwise denies any nausea, vomiting, chest pain, shortness of breath, fever, chills, headache, focal weakness, numbness tingling, speech difficulties. She complains of being hungry and asking for food, In the ED: bp IN 190S, LABS- bun/creat 75/10,Hco3 and K normal, HB 10 Nephrology consulted and admission requested.   Assessment and plan:  Problem with dialysis access ESRD on HD mwf: IR conulted. Nephro on consult-discussed-keeping n.p.o. past midnight for declotting of her access, per nephrology okay to hold off on Eliquis  for now given the need for procedure.  Currently no shortness of breath electrolytes- k and hco3 are stable although blood pressure high. Resume her Lasix  and antihypertensives.  IR advised to hold off on anticoagulation-given need for working on access versus new access/line.  Uncontrolled type 2 diabetes mellitus with hyperglycemia, with long-term current use of insulin : Resume long acting at half dose in am and ssi  Essential hypertension, benign Chronic heart failure with preserved ejection fraction (HCC) HtN Urgency: Blood pressure poorly controlled as she did not take her meds today due to HD plan- will resume bidil , labetalol  and prn hydralazine   Anemia of chronic disease Stable hb.monitor  CVA Hx: Mixed hyperlipidemia: Not taking  statins, Eliquis  at home-holding for procedure in the morning discussed nephrology and Dr. Frederic [name] from IR  Obstructive sleep apnea: Resume CPAP   Class I Body mass index is 31.91 kg/m.: Will benefit with PCP follow-up, weight loss,healthy lifestyle   DVT prophylaxis: SCDs Start: 04/15/24 1733 Code Status:   Code Status: Full Code Family Communication: plan of care discussed with patient at bedside. Patient status is: Remains hospitalized because of severity of illness Level of care: Telemetry Medical   Dispo: The patient is from: home            Anticipated disposition: TBD Objective: Vitals last 24 hrs: Vitals:   04/15/24 1327 04/15/24 1342  BP: (!) 197/95   Pulse: 72   Resp: 18   Temp: 98.3 F (36.8 C)   SpO2: 100%   Weight:  98 kg  Height:  5' 9 (1.753 m)    Physical Examination: General exam: alert awake, oriented, older than stated age HEENT:Oral mucosa moist, Ear/Nose WNL grossly Respiratory system: Bilaterally clear BS,no use of accessory muscle Cardiovascular system: S1 & S2 +, No JVD. Gastrointestinal system: Abdomen soft,NT,ND, BS+ Nervous System: Alert, awake, moving all extremities,and following commands. Extremities: LE edema neg, distal extremities warm.  Right upper extremity HD access + Skin: No rashes,no icterus. MSK: Normal muscle bulk,tone, power   Medications reviewed:  Scheduled Meds:  fenofibrate   54 mg Oral Daily   furosemide   20 mg Oral Daily   insulin  aspart  0-5 Units Subcutaneous QHS   [START ON 04/16/2024] insulin  aspart  0-6 Units Subcutaneous TID WC   [START ON 04/16/2024] insulin  glargine  5 Units Subcutaneous Daily   isosorbide -hydrALAZINE   1 tablet Oral TID   labetalol   300 mg Oral BID   Continuous Infusions: Diet: Diet Order             Diet Carb Modified Fluid consistency: Thin; Room service appropriate? Yes  Diet effective now                     Severity of Illness: The appropriate patient status for this  patient is OBSERVATION. Observation status is judged to be reasonable and necessary in order to provide the required intensity of service to ensure the patient's safety. The patient's presenting symptoms, physical exam findings, and initial radiographic and laboratory data in the context of their medical condition is felt to place them at decreased risk for further clinical deterioration. Furthermore, it is anticipated that the patient will be medically stable for discharge from the hospital within 2 midnights of admission.   Family Communication: Admission, patients condition and plan of care including tests being ordered have been discussed with the patient  who indicate understanding and agree with the plan and Code Status.  Consults called:  Nephro IR  Review of Systems: All systems were reviewed and were negative except as mentioned in HPI above. Negative for fever Negative for chest pain Negative for shortness of breath  Past Medical History:  Diagnosis Date   Anemia    Anxiety    Arthritis    Asthma    Atrial fib/flutter, transient (HCC) 01/2024   pt states that she was dx in [place]   Cataract    Mixed form OU   CKD (chronic kidney disease)    Coronary artery disease    Depression    Diabetes mellitus    Diabetic retinopathy (HCC)    NPDR OU   Dyspnea    Hyperlipidemia    Hypertension    Hypertensive retinopathy    OU   Left thyroid  nodule    diagnosed 07/2018   PAC (premature atrial contraction) 02/15/2021   Pseudotumor cerebri    Sleep apnea    Uses a cpap   Stroke [facility])    Vitamin D deficiency     Past Surgical History:  Procedure Laterality Date   A/V FISTULAGRAM N/A 08/16/2023   Procedure: A/V Fistulagram;  Surgeon: [name];  Location: [facility];  Service: Cardiovascular;  Laterality: N/A;   ACHILLES TENDON REPAIR Right    ACHILLES TENDON SURGERY Left 02/19/2021   Procedure: ACHILLES TENDON REPAIR WITH GRAFT;  Surgeon: [name];   Location: [facility];  Service: Podiatry;  Laterality: Left;  BLOCK   AMPUTATION TOE Right 05/28/2021   Procedure: AMPUTATION TOE;  Surgeon: [name];  Location: [facility] OR;  Service: Podiatry;  Laterality: Right;  Surgical team will do block   AV FISTULA PLACEMENT Left 07/29/2022   Procedure: LEFT ARTERIOVENOUS (AV) FISTULA CREATION;  Surgeon: Sheree Penne Bruckner, [name];  Location: [facility] OR;  Service: Vascular;  Laterality: Left;   AV FISTULA PLACEMENT Right 04/04/2023   Procedure: RIGHT ARM BRACHIOCEPHALIC ARTERIOVENOUS (AV) FISTULA CREATION;  Surgeon: Lanis Fonda BRAVO, [name];  Location: [facility] OR;  Service: Vascular;  Laterality: Right;  With regional block   GASTROC RECESSION EXTREMITY Left 02/19/2021   Procedure: GASTROC RECESSION EXTREMITY;  Surgeon: [name];  Location: [facility];  Service: Podiatry;  Laterality: Left;  Block   INSERTION OF DIALYSIS CATHETER Left 04/20/2023   Procedure: INSERTION OFTUNNELED  DIALYSIS CATHETER;  Surgeon: Lanis,  Fonda BRAVO, [name];  Location: [facility] OR;  Service: Vascular;  Laterality: Left;   LIGATION OF ARTERIOVENOUS  FISTULA Right 11/17/2023   Procedure: LIGATION OF ARTERIOVENOUS  FISTULA;  Surgeon: Lanis Fonda BRAVO, [name];  Location: [facility] OR;  Service: Vascular;  Laterality: Right;   PERIPHERAL VASCULAR BALLOON ANGIOPLASTY Right 08/16/2023   Procedure: PERIPHERAL VASCULAR BALLOON ANGIOPLASTY;  Surgeon: [name];  Location: [facility];  Service: Cardiovascular;  Laterality: Right;  innominate   REVISON OF ARTERIOVENOUS FISTULA Right 11/17/2023   Procedure: REVISON OF ARTERIOVENOUS FISTULA;  Surgeon: Lanis Fonda BRAVO, [name];  Location: [facility] OR;  Service: Vascular;  Laterality: Right;   TOE SURGERY Left 01/2023   TUBAL LIGATION       reports that she quit smoking about 8 years ago. Her smoking use included cigarettes. She started smoking about 38 years ago. She has a 15 pack-year smoking history. She has been exposed to  tobacco smoke. She has never used smokeless tobacco. She reports that she does not currently use alcohol. She reports that she does not currently use drugs after having used the following drugs: Marijuana.  Allergies  Allergen Reactions   Bee Pollen Other (See Comments)    Seasonal Allergies   Dust Mite Extract Cough    Sneezing & Cough   Mixed Ragweed Itching    Family History  Problem Relation Age of Onset   Asthma Mother    Diabetes Father    Hypertension Father    Kidney disease Father    Heart attack Father    Heart failure Father    Hypertension Sister    Asthma Sister    Congestive Heart Failure Maternal Aunt    Diabetes Maternal Grandmother    Congestive Heart Failure Maternal Grandmother    Lung disease Maternal Grandmother    Asthma Other    Hyperlipidemia Other    Hypertension Other    Cancer Other    BRCA 1/2 Neg Hx    Breast cancer Neg Hx      Prior to Admission medications   Medication Sig Start Date End Date Taking? Authorizing Provider  albuterol  (PROVENTIL ) (2.5 MG/3ML) 0.083% nebulizer solution Take 2.5 mg by nebulization every 6 (six) hours as needed for wheezing or shortness of breath. 08/10/18   Provider, Historical, [name]  albuterol  (VENTOLIN  HFA) 108 (90 Base) MCG/ACT inhaler Inhale 1-2 puffs into the lungs every 6 (six) hours as needed for wheezing or shortness of breath (Asthma).    Provider, Historical, [name]  apixaban  (ELIQUIS ) 5 MG TABS tablet Take 1 tablet (5 mg total) by mouth 2 (two) times daily. 03/26/24   Patwardhan, Newman PARAS, [name]  Blood Pressure Monitor DEVI 1 Device by Does not apply route daily. 07/17/23   Patwardhan, Newman PARAS, [name]  calcium  acetate (PHOSLO ) 667 MG capsule Take 1 capsule (667 mg total) by mouth 3 (three) times daily with meals. 04/22/23   Fairy Frames, [name]  Continuous Blood Gluc Receiver (DEXCOM G7 RECEIVER) DEVI Use to monitor BG continuously 04/13/22   Nida, Gebreselassie W, [name]  Continuous Glucose Sensor (DEXCOM G7 SENSOR) MISC CHANGE  SENSOR EVERY 10 DAYS 12/01/23   Nida, Gebreselassie W, [name]  diltiazem  (CARDIZEM ) 30 MG tablet Take 1 tablet (30 mg total) by mouth 3 (three) times daily as needed. 03/26/24   Patwardhan, Newman PARAS, [name]  fenofibrate  (TRICOR ) 145 MG tablet Take 145 mg by mouth daily. 08/23/23   Provider, Historical, [name]  furosemide  (LASIX ) 20 MG tablet Take 20 mg by  mouth daily.    Provider, Historical, [name]  insulin  aspart protamine - aspart (NOVOLOG  70/30 MIX) (70-30) 100 UNIT/ML FlexPen Inject 10 Units into the skin 2 (two) times daily with a meal. 04/22/23   Fairy Frames, [name]  insulin  glargine (LANTUS ) 100 UNIT/ML injection Inject 10 Units into the skin daily.    Provider, Historical, [name]  Insulin  Pen Needle 32G X 4 MM MISC Use to inject insulin  twice daily 01/23/23   Nida, Gebreselassie W, [name]  isosorbide -hydrALAZINE  (BIDIL ) 20-37.5 MG tablet Take 1 tablet by mouth 3 (three) times daily. 04/23/22   Gonfa, Taye T, [name]  labetalol  (NORMODYNE ) 300 MG tablet Take 1 tablet (300 mg total) by mouth 2 (two) times daily. 03/26/24   Patwardhan, Newman PARAS, [name]  LOVAZA 1 g capsule Take 2 g by mouth 2 (two) times daily. Patient not taking: Reported on 03/26/2024    Provider, Historical, [name]  Methoxy PEG-Epoetin  Beta (MIRCERA IJ) Mircera Patient not taking: Reported on 03/26/2024 07/18/23 07/16/24  Provider, Historical, [name]  multivitamin (RENA-VIT) TABS tablet Take 1 tablet by mouth daily. Patient not taking: Reported on 03/26/2024    Provider, Historical, [name]  NOVOLOG  FLEXPEN 100 UNIT/ML FlexPen Inject 10-15 Units into the skin at bedtime as needed for high blood sugar. 08/23/23   Provider, Historical, [name]  oxyCODONE -acetaminophen  (PERCOCET/ROXICET) 5-325 MG tablet Take 1 tablet by mouth every 6 (six) hours as needed. Patient not taking: Reported on 03/26/2024 11/17/23   Gerome Maurilio HERO, PA-C  rosuvastatin  (CRESTOR ) 40 MG tablet Take 40 mg by mouth in the morning.    Provider, Historical, [name]  Semaglutide ,0.25 or 0.5MG /DOS, (OZEMPIC , 0.25 OR 0.5  MG/DOSE,) 2 MG/3ML SOPN INJECT 0.25MG  INTO THE SKIN ONE TIME PER WEEK Patient not taking: Reported on 03/26/2024 07/31/23   Lenis Ethelle ORN, [name]   r   Labs on Admission: I have personally reviewed following labs and imaging studies  CBC: Recent Labs  Lab 04/15/24 1343 04/15/24 1358  WBC 7.7  --   HGB 9.9* 10.2*  HCT 30.1* 30.0*  MCV 102.4*  --   PLT 289  --    Basic Metabolic Panel: Recent Labs  Lab 04/15/24 1343 04/15/24 1358  NA 139 137  K 3.9 3.9  CL 100 102  CO2 22  --   GLUCOSE 191* 187*  BUN 71* 75*  CREATININE 9.40* 10.60*  CALCIUM  8.5*  --    Estimated Creatinine Clearance: 7.8 mL/min (A) (by C-G formula based on SCr of 10.6 mg/dL (H)). Recent Labs  Lab 04/15/24 1343  AST 15  ALT 12  ALKPHOS 37*  BILITOT 0.5  PROT 7.3  ALBUMIN 3.4*   Thyroid  Function Tests: No results for input(s): TSH, T4TOTAL, FREET4, T3FREE, THYROIDAB in the last 72 hours. Urine analysis:    Component Value Date/Time   COLORURINE YELLOW 04/15/2023 1539   APPEARANCEUR HAZY (A) 04/15/2023 1539   LABSPEC 1.012 04/15/2023 1539   PHURINE 5.0 04/15/2023 1539   GLUCOSEU 50 (A) 04/15/2023 1539   HGBUR NEGATIVE 04/15/2023 1539   BILIRUBINUR NEGATIVE 04/15/2023 1539   KETONESUR NEGATIVE 04/15/2023 1539   PROTEINUR >=300 (A) 04/15/2023 1539   UROBILINOGEN 0.2 08/25/2011 2043   NITRITE NEGATIVE 04/15/2023 1539   LEUKOCYTESUR NEGATIVE 04/15/2023 1539    Radiological Exams on Admission: No results found.  Mennie LAMY [name] [facility]  If 7PM-7AM, please contact night-coverage [email or web address]  04/15/2024, 5:56 PM

## 2024-04-15 NOTE — ED Triage Notes (Addendum)
 PT sent from dialysis b/c they could not access her graft, stating it is clogged. Pt does not appear in any distress.

## 2024-04-15 NOTE — Hospital Course (Addendum)
 [name] is a 53 y.o. year old female with PMH of hypertension, hyperlipidemia, obesity, DM, PVD Asthma, atrial fibrillation on Eliquis  , multiple strokes ESRD ON hd, who has been having dialysis access issues sent to the emergency room from dialysis as they could not access the HD graft.  Her last dialysis Friday- she went for dialysis today but they could not acces her fistula to sent to ED. Patient otherwise denies any nausea, vomiting, chest pain, shortness of breath, fever, chills, headache, focal weakness, numbness tingling, speech difficulties. She complains of being hungry and asking for food, In the ED: bp IN 190S, LABS- bun/creat 75/10,Hco3 and K normal, HB 10 Nephrology consulted and admission requested.  Subjective: Seen this am Resting well no Chest pain or shortness of breath.   Overnight afebrile BP in 130s, on room air Labs with creatinine 3.3 bicarb 21 hemoglobin 8.6 Waiting for IR  Assessment and plan:  Problem with dialysis access ESRD on HD mwf: IR conulted. Nephro on consult-keeping n.p.o. holding Eliquis  as plan for de clot or new HD catheter today by IR. Last dialysis on Friday electrolytes so far is stable and no shortness of breath or evidence of hypervolemia continue Lasix   Uncontrolled type 2 diabetes mellitus with hyperglycemia, with long-term current use of insulin : Stable, continue low-dose of long-acting insulin , sliding scale insulin    Essential hypertension, benign Chronic heart failure with preserved ejection fraction (HCC) HtN Urgency: Blood pressure poorly controlled as she did not take her meds for HD.  Blood pressure stable continue home labetalol  and Lasix  and BiDil   Anemia of chronic disease Stable hb.monitor  CVA Hx: Mixed hyperlipidemia: Not taking statins, Eliquis  at home-holding for procedure-discussed nephrology and Dr. Frederic Primrose from IR  Obstructive sleep apnea: Resume CPAP   Class I Body mass index is 31.91 kg/m.: Will  benefit with PCP follow-up, weight loss,healthy lifestyle   DVT prophylaxis: heparin  injection 5,000 Units Start: 04/15/24 2200 SCDs Start: 04/15/24 1733 Code Status:   Code Status: Full Code Family Communication: plan of care discussed with patient at bedside. Patient status is: Remains hospitalized because of severity of illness Level of care: Telemetry Medical   Dispo: The patient is from: home            Anticipated disposition: TBD Objective: Vitals last 24 hrs: Vitals:   04/15/24 1813 04/15/24 1932 04/16/24 0007 04/16/24 0824  BP: (!) 198/92  137/74 (!) 149/88  Pulse: 76  66   Resp: 18   20  Temp: 98.1 F (36.7 C) 98 F (36.7 C) 98 F (36.7 C) 97.7 F (36.5 C)  TempSrc: Oral Oral Oral Oral  SpO2: 100%  98%   Weight:      Height:        Physical Examination: General exam: AAOX3 HEENT:Oral mucosa moist, Ear/Nose WNL grossly Respiratory system: Bilaterally clear BS,no use of accessory muscle Cardiovascular system: S1 & S2 +, No JVD. Gastrointestinal system: Abdomen soft,NT,ND, BS+ Nervous System: Alert, awake, moving all extremities,and following commands. Extremities: LE edema neg, distal extremities warm.  Right upper extremity HD access + Skin: No rashes,no icterus. MSK: Normal muscle bulk,tone, power   Medications reviewed:  Scheduled Meds:  fenofibrate   54 mg Oral Daily   furosemide   20 mg Oral Daily   heparin   5,000 Units Subcutaneous Q8H   insulin  aspart  0-5 Units Subcutaneous QHS   insulin  aspart  0-6 Units Subcutaneous TID WC   insulin  glargine  5 Units Subcutaneous Daily   isosorbide -hydrALAZINE   1 tablet Oral TID   labetalol   300 mg Oral BID   Continuous Infusions: Diet: Diet Order             Diet NPO time specified Except for: Sips with Meds  Diet effective now

## 2024-04-15 NOTE — Progress Notes (Addendum)
 Patient brought to 4E from ED. Telemetry box applied, CCMD notified. Patient oriented to room and staff. Call bell in reach. Labetalol , lasix  and isosorbide  given per order.   04/15/24 1813  Vitals  Temp 98.1 F (36.7 C)  Temp Source Oral  BP (!) 198/92  MAP (mmHg) 123  BP Location Left Arm  BP Method Automatic  Patient Position (if appropriate) Lying  Pulse Rate 76  Pulse Rate Source Monitor  ECG Heart Rate 76  Resp 18  Level of Consciousness  Level of Consciousness Alert  MEWS COLOR  MEWS Score Color Green  Oxygen Therapy  SpO2 100 %  O2 Device Room Air  MEWS Score  MEWS Temp 0  MEWS Systolic 0  MEWS Pulse 0  MEWS RR 0  MEWS LOC 0  MEWS Score 0

## 2024-04-16 ENCOUNTER — Observation Stay (HOSPITAL_COMMUNITY)

## 2024-04-16 DIAGNOSIS — T82898A Other specified complication of vascular prosthetic devices, implants and grafts, initial encounter: Secondary | ICD-10-CM | POA: Diagnosis not present

## 2024-04-16 HISTORY — PX: IR US GUIDE VASC ACCESS RIGHT: IMG2390

## 2024-04-16 HISTORY — PX: IR THROMBECTOMY AV FISTULA W/THROMBOLYSIS/PTA INC/SHUNT/IMG RIGHT: IMG6119

## 2024-04-16 LAB — CBC
HCT: 26.1 % — ABNORMAL LOW (ref 36.0–46.0)
Hemoglobin: 8.6 g/dL — ABNORMAL LOW (ref 12.0–15.0)
MCH: 32.8 pg (ref 26.0–34.0)
MCHC: 33 g/dL (ref 30.0–36.0)
MCV: 99.6 fL (ref 80.0–100.0)
Platelets: 258 K/uL (ref 150–400)
RBC: 2.62 MIL/uL — ABNORMAL LOW (ref 3.87–5.11)
RDW: 17.7 % — ABNORMAL HIGH (ref 11.5–15.5)
WBC: 7.8 K/uL (ref 4.0–10.5)
nRBC: 0.3 % — ABNORMAL HIGH (ref 0.0–0.2)

## 2024-04-16 LAB — BASIC METABOLIC PANEL WITH GFR
Anion gap: 18 — ABNORMAL HIGH (ref 5–15)
BUN: 80 mg/dL — ABNORMAL HIGH (ref 6–20)
CO2: 21 mmol/L — ABNORMAL LOW (ref 22–32)
Calcium: 8.1 mg/dL — ABNORMAL LOW (ref 8.9–10.3)
Chloride: 98 mmol/L (ref 98–111)
Creatinine, Ser: 10.01 mg/dL — ABNORMAL HIGH (ref 0.44–1.00)
GFR, Estimated: 4 mL/min — ABNORMAL LOW (ref >60)
Glucose, Bld: 258 mg/dL — ABNORMAL HIGH (ref 70–99)
Potassium: 3.3 mmol/L — ABNORMAL LOW (ref 3.5–5.1)
Sodium: 137 mmol/L (ref 135–145)

## 2024-04-16 LAB — GLUCOSE, CAPILLARY
Glucose-Capillary: 129 mg/dL — ABNORMAL HIGH (ref 70–99)
Glucose-Capillary: 151 mg/dL — ABNORMAL HIGH (ref 70–99)
Glucose-Capillary: 295 mg/dL — ABNORMAL HIGH (ref 70–99)

## 2024-04-16 LAB — HEPATITIS B SURFACE ANTIGEN: Hepatitis B Surface Ag: NONREACTIVE

## 2024-04-16 MED ORDER — MIDAZOLAM HCL 2 MG/2ML IJ SOLN
INTRAMUSCULAR | Status: AC | PRN
  Administered 2024-04-16: 1 mg via INTRAVENOUS
  Administered 2024-04-16: .5 mg via INTRAVENOUS
  Administered 2024-04-16: 1 mg via INTRAVENOUS
  Administered 2024-04-16: .5 mg via INTRAVENOUS

## 2024-04-16 MED ORDER — LIDOCAINE-EPINEPHRINE 1 %-1:100000 IJ SOLN
INTRAMUSCULAR | Status: AC
  Filled 2024-04-16: qty 1

## 2024-04-16 MED ORDER — FENTANYL CITRATE (PF) 100 MCG/2ML IJ SOLN
INTRAMUSCULAR | Status: AC | PRN
  Administered 2024-04-16 (×4): 25 ug via INTRAVENOUS

## 2024-04-16 MED ORDER — FENTANYL CITRATE (PF) 100 MCG/2ML IJ SOLN
INTRAMUSCULAR | Status: AC
  Filled 2024-04-16: qty 2

## 2024-04-16 MED ORDER — MIDAZOLAM HCL 2 MG/2ML IJ SOLN
INTRAMUSCULAR | Status: AC
  Filled 2024-04-16: qty 2

## 2024-04-16 MED ORDER — LIDOCAINE-EPINEPHRINE 1 %-1:100000 IJ SOLN
10.0000 mL | Freq: Once | INTRAMUSCULAR | Status: AC
  Administered 2024-04-16: 10 mL via INTRADERMAL

## 2024-04-16 MED ORDER — HEPARIN SODIUM (PORCINE) 1000 UNIT/ML IJ SOLN
INTRAMUSCULAR | Status: AC | PRN
  Administered 2024-04-16: 3000 [IU] via INTRAVENOUS

## 2024-04-16 MED ORDER — LIDOCAINE HCL (PF) 1 % IJ SOLN: 5.0000 mL | INTRAMUSCULAR | Status: DC | PRN

## 2024-04-16 MED ORDER — IOHEXOL 300 MG/ML  SOLN
100.0000 mL | Freq: Once | INTRAMUSCULAR | Status: AC | PRN
  Administered 2024-04-16: 50 mL via INTRAVENOUS

## 2024-04-16 MED ORDER — ALTEPLASE 2 MG IJ SOLR
INTRAMUSCULAR | Status: AC
  Filled 2024-04-16: qty 2

## 2024-04-16 MED ORDER — HEPARIN SODIUM (PORCINE) 1000 UNIT/ML IJ SOLN
INTRAMUSCULAR | Status: AC
  Filled 2024-04-16: qty 10

## 2024-04-16 MED ORDER — ALTEPLASE 2 MG IJ SOLR
INTRAMUSCULAR | Status: AC | PRN
  Administered 2024-04-16: 2 mg

## 2024-04-16 MED ORDER — HEPARIN (PORCINE) 25000 UT/250ML-% IV SOLN
1250.0000 [IU]/h | INTRAVENOUS | Status: DC
  Administered 2024-04-16: 1250 [IU]/h via INTRAVENOUS
  Filled 2024-04-16: qty 250

## 2024-04-16 MED ORDER — CHLORHEXIDINE GLUCONATE CLOTH 2 % EX PADS
6.0000 | MEDICATED_PAD | Freq: Every day | CUTANEOUS | Status: DC
Start: 2024-04-17 — End: 2024-04-18
  Administered 2024-04-17 – 2024-04-18 (×2): 6 via TOPICAL

## 2024-04-16 NOTE — Plan of Care (Signed)

## 2024-04-16 NOTE — Progress Notes (Signed)
 Pt receives out-pt HD at [facility] on MWF 12:05 pm chair time. Will assist as needed.   Randine Mungo Dialysis Navigator [phone]

## 2024-04-16 NOTE — Progress Notes (Signed)
   04/16/24 1811  Fistula / Graft Right Upper arm Arteriovenous fistula  Placement Date/Time: 04/04/23 0807   Placed prior to admission: No  Orientation: Right  Access Location: (c) Upper arm  Access Type: Arteriovenous fistula  Site Condition Bleeding  Fistula / Graft Assessment Absent;Other (Comment) (bruit and thrill absent, did not attempt cannulation.)  Status Deaccessed  Drainage Description Serosanguineous   Notified MD-Upton on call.  Pt will be sent back to floor.

## 2024-04-16 NOTE — Consult Note (Signed)
 Chief Complaint: Malfunctioning RUE graft  Referring Provider(s): Dr. Mennie Lamy  Supervising Physician: Philip Cornet  Patient Status: [facility] - In-pt  History of Present Illness: [name] is a 53 y.o. female with PMH of hypertension, hyperlipidemia, obesity, DM, PVD Asthma, atrial fibrillation (Eliquis ) , multiple strokes ESRD. She received HD MWF via RUE graft. Presented to the ED yesterday as HD staff were unable to access graft (last treatment Friday). IR consulted for graft declot and possible TDC placement; per note from Dr. Lamy, Dr. Vanice directed him to hold eliquis  for possible 9/9 procedure.  Dr. Sheree with vascular was also consulted. Patient was added to the vascular schedule for Thurs as earliest available time unless IR can ideally treat prior to this.   Confirms NPO since MN.  Patient wears CPAP at night.   Denies fever, chills, SOB, CP, sore throat, N/V, abd pain, blood in stool or urine, abnormal bruising, leg swelling, back pain.   Allergies Reviewed:  Bee pollen, Dust mite extract, and Mixed ragweed   Patient is Full Code  Past Medical History:  Diagnosis Date   Anemia    Anxiety    Arthritis    Asthma    Atrial fib/flutter, transient (HCC) 01/2024   pt states that she was dx in [place]   Cataract    Mixed form OU   CKD (chronic kidney disease)    Coronary artery disease    Depression    Diabetes mellitus    Diabetic retinopathy (HCC)    NPDR OU   Dyspnea    Hyperlipidemia    Hypertension    Hypertensive retinopathy    OU   Left thyroid  nodule    diagnosed 07/2018   PAC (premature atrial contraction) 02/15/2021   Pseudotumor cerebri    Sleep apnea    Uses a cpap   Stroke [facility])    Vitamin D deficiency     Past Surgical History:  Procedure Laterality Date   A/V FISTULAGRAM N/A 08/16/2023   Procedure: A/V Fistulagram;  Surgeon: Melia Lynwood ORN, MD;  Location: [facility] IN[facility];  Service: Cardiovascular;  Laterality: N/A;   ACHILLES TENDON REPAIR  Right    ACHILLES TENDON SURGERY Left 02/19/2021   Procedure: ACHILLES TENDON REPAIR WITH GRAFT;  Surgeon: Tobie Franky SQUIBB, DPM;  Location: [facility];  Service: Podiatry;  Laterality: Left;  BLOCK   AMPUTATION TOE Right 05/28/2021   Procedure: AMPUTATION TOE;  Surgeon: Joya Stabs, MD;  Location: [facility] OR;  Service: Podiatry;  Laterality: Right;  Surgical team will do block   AV FISTULA PLACEMENT Left 07/29/2022   Procedure: LEFT ARTERIOVENOUS (AV) FISTULA CREATION;  Surgeon: Sheree Penne Bruckner, MD;  Location: [facility] OR;  Service: Vascular;  Laterality: Left;   AV FISTULA PLACEMENT Right 04/04/2023   Procedure: RIGHT ARM BRACHIOCEPHALIC ARTERIOVENOUS (AV) FISTULA CREATION;  Surgeon: Lanis Fonda FORBES, MD;  Location: [facility] OR;  Service: Vascular;  Laterality: Right;  With regional block   GASTROC RECESSION EXTREMITY Left 02/19/2021   Procedure: GASTROC RECESSION EXTREMITY;  Surgeon: Tobie Franky SQUIBB, DPM;  Location: [facility];  Service: Podiatry;  Laterality: Left;  Block   INSERTION OF DIALYSIS CATHETER Left 04/20/2023   Procedure: INSERTION OFTUNNELED  DIALYSIS CATHETER;  Surgeon: Lanis Fonda FORBES, MD;  Location: [facility] OR;  Service: Vascular;  Laterality: Left;   LIGATION OF ARTERIOVENOUS  FISTULA Right 11/17/2023   Procedure: LIGATION OF ARTERIOVENOUS  FISTULA;  Surgeon: Lanis Fonda FORBES, MD;  Location:  [facility] OR;  Service: Vascular;  Laterality: Right;   PERIPHERAL VASCULAR BALLOON ANGIOPLASTY Right 08/16/2023   Procedure: PERIPHERAL VASCULAR BALLOON ANGIOPLASTY;  Surgeon: Melia Lynwood ORN, MD;  Location: [facility];  Service: Cardiovascular;  Laterality: Right;  innominate   REVISON OF ARTERIOVENOUS FISTULA Right 11/17/2023   Procedure: REVISON OF ARTERIOVENOUS FISTULA;  Surgeon: Lanis Fonda BRAVO, MD;  Location: [facility] OR;  Service: Vascular;  Laterality: Right;   TOE SURGERY Left 01/2023   TUBAL LIGATION        Medications: Prior to Admission medications   Medication  Sig Start Date End Date Taking? Authorizing Provider  albuterol  (PROVENTIL ) (2.5 MG/3ML) 0.083% nebulizer solution Take 2.5 mg by nebulization every 6 (six) hours as needed for wheezing or shortness of breath. 08/10/18   [provider]  albuterol  (VENTOLIN  HFA) 108 (90 Base) MCG/ACT inhaler Inhale 1-2 puffs into the lungs every 6 (six) hours as needed for wheezing or shortness of breath (Asthma).    [provider]  apixaban  (ELIQUIS ) 5 MG TABS tablet Take 1 tablet (5 mg total) by mouth 2 (two) times daily. 03/26/24   Patwardhan, Newman PARAS, MD  Blood Pressure Monitor DEVI 1 Device by Does not apply route daily. 07/17/23   Patwardhan, Newman PARAS, MD  calcium  acetate (PHOSLO ) 667 MG capsule Take 1 capsule (667 mg total) by mouth 3 (three) times daily with meals. 04/22/23   Fairy Frames, MD  Continuous Blood Gluc Receiver (DEXCOM G7 RECEIVER) DEVI Use to monitor BG continuously 04/13/22   Nida, Gebreselassie W, MD  Continuous Glucose Sensor (DEXCOM G7 SENSOR) MISC CHANGE SENSOR EVERY 10 DAYS 12/01/23   Nida, Gebreselassie W, MD  diltiazem  (CARDIZEM ) 30 MG tablet Take 1 tablet (30 mg total) by mouth 3 (three) times daily as needed. 03/26/24   Patwardhan, Newman PARAS, MD  fenofibrate  (TRICOR ) 145 MG tablet Take 145 mg by mouth daily. 08/23/23   [provider]  furosemide  (LASIX ) 20 MG tablet Take 20 mg by mouth daily.    [provider]  insulin  aspart protamine - aspart (NOVOLOG  70/30 MIX) (70-30) 100 UNIT/ML FlexPen Inject 10 Units into the skin 2 (two) times daily with a meal. 04/22/23   Fairy Frames, MD  insulin  glargine (LANTUS ) 100 UNIT/ML injection Inject 10 Units into the skin daily.    [provider]  Insulin  Pen Needle 32G X 4 MM MISC Use to inject insulin  twice daily 01/23/23   Nida, Gebreselassie W, MD  isosorbide -hydrALAZINE  (BIDIL ) 20-37.5 MG tablet Take 1 tablet by mouth 3 (three) times daily. 04/23/22   Gonfa, Taye T, MD  labetalol  (NORMODYNE ) 300 MG  tablet Take 1 tablet (300 mg total) by mouth 2 (two) times daily. 03/26/24   Patwardhan, Newman PARAS, MD  LOVAZA 1 g capsule Take 2 g by mouth 2 (two) times daily. Patient not taking: Reported on 03/26/2024    [provider]  Methoxy PEG-Epoetin  Beta (MIRCERA IJ) Mircera Patient not taking: Reported on 03/26/2024 07/18/23 07/16/24  [provider]  multivitamin (RENA-VIT) TABS tablet Take 1 tablet by mouth daily. Patient not taking: Reported on 03/26/2024    [provider]  NOVOLOG  FLEXPEN 100 UNIT/ML FlexPen Inject 10-15 Units into the skin at bedtime as needed for high blood sugar. 08/23/23   [provider]  oxyCODONE -acetaminophen  (PERCOCET/ROXICET) 5-325 MG tablet Take 1 tablet by mouth every 6 (six) hours as needed. Patient not taking: Reported on 03/26/2024 11/17/23   Gerome Maurilio HERO, PA-C  rosuvastatin  (CRESTOR ) 40  MG tablet Take 40 mg by mouth in the morning.    [provider]  Semaglutide ,0.25 or 0.5MG /DOS, (OZEMPIC , 0.25 OR 0.5 MG/DOSE,) 2 MG/3ML SOPN INJECT 0.25MG  INTO THE SKIN ONE TIME PER WEEK Patient not taking: Reported on 03/26/2024 07/31/23   Lenis Ethelle ORN, MD     Family History  Problem Relation Age of Onset   Asthma Mother    Diabetes Father    Hypertension Father    Kidney disease Father    Heart attack Father    Heart failure Father    Hypertension Sister    Asthma Sister    Congestive Heart Failure Maternal Aunt    Diabetes Maternal Grandmother    Congestive Heart Failure Maternal Grandmother    Lung disease Maternal Grandmother    Asthma Other    Hyperlipidemia Other    Hypertension Other    Cancer Other    BRCA 1/2 Neg Hx    Breast cancer Neg Hx     Social History   Socioeconomic History   Marital status: Single    Spouse name: Not on file   Number of children: 4   Years of education: Not on file   Highest education level: Not on file  Occupational History   Occupation: unemployed  Tobacco Use    Smoking status: Former    Current packs/day: 0.00    Average packs/day: 0.5 packs/day for 30.0 years (15.0 ttl pk-yrs)    Types: Cigarettes    Start date: 33    Quit date: 2017    Years since quitting: 8.6    Passive exposure: Past   Smokeless tobacco: Never  Vaping Use   Vaping status: Never Used  Substance and Sexual Activity   Alcohol use: Not Currently    Alcohol/week: 0.0 standard drinks of alcohol    Comment: rare   Drug use: Not Currently    Types: Marijuana    Comment: occasional   Sexual activity: Not Currently    Partners: Male    Birth control/protection: Surgical  Other Topics Concern   Not on file  Social History Narrative   Lives with 3 kids   Right handed   Drinks 2 cups caffeine daily   Dialysis [facility] [employer]   Social Drivers of Health   Financial Resource Strain: Not on file  Food Insecurity: No Food Insecurity (04/13/2023)   Hunger Vital Sign    Worried About Running Out of Food in the Last Year: Never true    Ran Out of Food in the Last Year: Never true  Transportation Needs: No Transportation Needs (04/13/2023)   PRAPARE - Administrator, Civil Service (Medical): No    Lack of Transportation (Non-Medical): No  Physical Activity: Inactive (02/15/2021)   Exercise Vital Sign    Days of Exercise per Week: 0 days    Minutes of Exercise per Session: 0 min  Stress: Stress Concern Present (02/15/2021)   [employer] of Occupational Health - Occupational Stress Questionnaire    Feeling of Stress : Very much  Social Connections: Moderately Isolated (02/15/2021)   Social Connection and Isolation Panel    Frequency of Communication with Friends and Family: Three times a week    Frequency of Social Gatherings with Friends and Family: Three times a week    Attends Religious Services: More than 4 times per year    Active Member of Clubs or Organizations: No    Attends Banker Meetings: Never  Marital Status: Never  married     Review of Systems: A 12 point ROS discussed and pertinent positives are indicated in the HPI above.  All other systems are negative.    Vital Signs: BP 117/70 (BP Location: Left Arm)   Pulse 71   Temp 97.8 F (36.6 C) (Oral)   Resp 14   Ht 5' 9 (1.753 m)   Wt 216 lb 0.8 oz (98 kg)   LMP 12/21/2021   SpO2 100%   BMI 31.91 kg/m     Physical Exam HENT:     Mouth/Throat:     Mouth: Mucous membranes are moist.     Pharynx: Oropharynx is clear.  Cardiovascular:     Rate and Rhythm: Normal rate and regular rhythm.     Pulses: Normal pulses.     Heart sounds: Murmur heard.  Pulmonary:     Effort: Pulmonary effort is normal.     Breath sounds: Normal breath sounds.  Abdominal:     General: There is no distension.     Palpations: Abdomen is soft.     Tenderness: There is no abdominal tenderness.  Skin:    General: Skin is warm and dry.     Comments: No thrill felt RUE graft site, no redness associated, does report tenderness around distal portion of graft.     Neurological:     Mental Status: She is alert and oriented to person, place, and time.  Psychiatric:        Mood and Affect: Mood normal.        Behavior: Behavior normal.     Imaging: No results found.  Labs:  CBC: Recent Labs    04/22/23 0250 10/16/23 1838 11/17/23 0557 04/15/24 1343 04/15/24 1358 04/16/24 0419  WBC 7.3 6.7  --  7.7  --  7.8  HGB 8.0* 8.6* 12.6 9.9* 10.2* 8.6*  HCT 25.4* 26.1* 37.0 30.1* 30.0* 26.1*  PLT 254 220  --  289  --  258    COAGS: No results for input(s): INR, APTT in the last 8760 hours.  BMP: Recent Labs    04/22/23 0250 10/16/23 1838 11/17/23 0557 04/15/24 1343 04/15/24 1358 04/15/24 1929 04/16/24 0419  NA 135 137 136 139 137  --  137  K 3.8 3.4* 3.5 3.9 3.9  --  3.3*  CL 96* 99 94* 100 102  --  98  CO2 27 23  --  22  --   --  21*  GLUCOSE 153* 179* 225* 191* 187*  --  258*  BUN 52* 50* 37* 71* 75*  --  80*  CALCIUM  8.5* 8.5*  --   8.5*  --   --  8.1*  CREATININE 4.86* 6.16* 7.10* 9.40* 10.60* 9.85* 10.01*  GFRNONAA 10* 8*  --  5*  --  4* 4*    LIVER FUNCTION TESTS: Recent Labs    04/18/23 0311 04/19/23 0401 04/15/24 1343  BILITOT  --   --  0.5  AST  --   --  15  ALT  --   --  12  ALKPHOS  --   --  37*  PROT  --   --  7.3  ALBUMIN 3.2* 3.3* 3.4*    TUMOR MARKERS: No results for input(s): AFPTM, CEA, CA199, CHROMGRNA in the last 8760 hours.  Assessment and Plan:  Request for  image guided RUE graft dialysis circuit study with possible thrombectomy, poss angioplasty, possible TDC placement approved.  No contraindications for procedure  identified in ROS, physical exam, or review of pre-sedation considerations. 9/9 Labs reviewed and within acceptable range. No pending BC.  No imaging available. VSS, afebrile   Risks and benefits of the dialysis circuit study were discussed with the patient including, but not limited to bleeding, infection, vascular injury, and inability to improve the function of the fistula/graft which would require placement of a tunneled dialysis catheter.  All of the patient's questions were answered, patient is agreeable to proceed. Consent signed and in chart.   Thank you for allowing our service to participate in [name] 's care.    Electronically Signed: Laymon Coast, NP   04/16/2024, 1:32 PM     I spent a total of 20 Minutes    in face to face in clinical consultation, greater than 50% of which was counseling/coordinating care for image guided dialysis circuit study, poss TDC.   (A copy of this note was sent to the referring provider and the time of visit.)

## 2024-04-16 NOTE — Progress Notes (Addendum)
 PHARMACY - ANTICOAGULATION CONSULT NOTE  Pharmacy Consult for heparin  Indication: atrial fibrillation  Allergies  Allergen Reactions   Bee Pollen Other (See Comments)    Seasonal Allergies   Dust Mite Extract Cough    Sneezing & Cough   Mixed Ragweed Itching    Patient Measurements: Height: 5' 9 (175.3 cm) Weight: 98 kg (216 lb 0.8 oz) IBW/kg (Calculated) : 66.2 HEPARIN  DW (KG): 87.3  Vital Signs: Temp: 97.5 F (36.4 C) (09/09 1811) Temp Source: Oral (09/09 1700) BP: 133/69 (09/09 1811) Pulse Rate: 72 (09/09 1811)  Labs: Recent Labs    04/15/24 1343 04/15/24 1358 04/15/24 1929 04/16/24 0419  HGB 9.9* 10.2*  --  8.6*  HCT 30.1* 30.0*  --  26.1*  PLT 289  --   --  258  CREATININE 9.40* 10.60* 9.85* 10.01*    Estimated Creatinine Clearance: 8.3 mL/min (A) (by C-G formula based on SCr of 10.01 mg/dL (H)).   Medical History: Past Medical History:  Diagnosis Date   Anemia    Anxiety    Arthritis    Asthma    Atrial fib/flutter, transient (HCC) 01/2024   pt states that she was dx in [place]   Cataract    Mixed form OU   CKD (chronic kidney disease)    Coronary artery disease    Depression    Diabetes mellitus    Diabetic retinopathy (HCC)    NPDR OU   Dyspnea    Hyperlipidemia    Hypertension    Hypertensive retinopathy    OU   Left thyroid  nodule    diagnosed 07/2018   PAC (premature atrial contraction) 02/15/2021   Pseudotumor cerebri    Sleep apnea    Uses a cpap   Stroke (HCC)    Vitamin D deficiency     Medications:  Medications Prior to Admission  Medication Sig Dispense Refill Last Dose/Taking   albuterol  (PROVENTIL ) (2.5 MG/3ML) 0.083% nebulizer solution Take 2.5 mg by nebulization every 6 (six) hours as needed for wheezing or shortness of breath.   Unknown   albuterol  (VENTOLIN  HFA) 108 (90 Base) MCG/ACT inhaler Inhale 1-2 puffs into the lungs every 6 (six) hours as needed for wheezing or shortness of breath (Asthma).   Unknown   apixaban   (ELIQUIS ) 5 MG TABS tablet Take 1 tablet (5 mg total) by mouth 2 (two) times daily. 180 tablet 3 04/15/2024 at  7:00 AM   diltiazem  (CARDIZEM ) 30 MG tablet Take 1 tablet (30 mg total) by mouth 3 (three) times daily as needed. 90 tablet 1 Unknown   fenofibrate  (TRICOR ) 145 MG tablet Take 145 mg by mouth daily.   04/14/2024   FLONASE ALLERGY RELIEF 50 MCG/ACT nasal spray Place 1 spray into both nostrils daily.   Past Week   furosemide  (LASIX ) 20 MG tablet Take 20 mg by mouth daily.   04/14/2024   insulin  aspart protamine - aspart (NOVOLOG  70/30 MIX) (70-30) 100 UNIT/ML FlexPen Inject 10 Units into the skin 2 (two) times daily with a meal.   04/14/2024   insulin  glargine (LANTUS ) 100 UNIT/ML injection Inject 10 Units into the skin at bedtime.   04/14/2024   isosorbide -hydrALAZINE  (BIDIL ) 20-37.5 MG tablet Take 1 tablet by mouth 3 (three) times daily. 270 tablet 1 04/14/2024   labetalol  (NORMODYNE ) 300 MG tablet Take 1 tablet (300 mg total) by mouth 2 (two) times daily. 180 tablet 3 Taking   LOVAZA 1 g capsule Take 2 g by mouth 2 (two) times daily.  04/14/2024   rosuvastatin  (CRESTOR ) 40 MG tablet Take 40 mg by mouth in the morning.   04/14/2024   Blood Pressure Monitor DEVI 1 Device by Does not apply route daily. 1 each 0    Continuous Blood Gluc Receiver (DEXCOM G7 RECEIVER) DEVI Use to monitor BG continuously 1 each 0    Continuous Glucose Sensor (DEXCOM G7 SENSOR) MISC CHANGE SENSOR EVERY 10 DAYS 3 each 0    Insulin  Pen Needle 32G X 4 MM MISC Use to inject insulin  twice daily 200 each 2    Scheduled:   [START ON 04/17/2024] Chlorhexidine  Gluconate Cloth  6 each Topical Q0600   fenofibrate   54 mg Oral Daily   furosemide   20 mg Oral Daily   heparin   5,000 Units Subcutaneous Q8H   insulin  aspart  0-5 Units Subcutaneous QHS   insulin  aspart  0-6 Units Subcutaneous TID WC   insulin  glargine  5 Units Subcutaneous Daily   isosorbide -hydrALAZINE   1 tablet Oral TID   labetalol   300 mg Oral BID    Assessment: 53  yo female with afib to start IV heparin .She is noted on apixaban  PTA and last dose was 9/8 in the morning. Plans noted for possible HD catheter on 9/10  -he is noted s/p declot of an HD graft today -last heparin  sq given on 9/8 -hg= 8.6   Goal of Therapy:  Heparin  level 0.3-0.7 units/ml Monitor platelets by anticoagulation protocol: Yes   Plan:  -No heparin  bolus -Begin heparin  at 1250 units/hr -Heparin  level in 8 hours and daily wth CBC daily  Prentice Poisson, PharmD Clinical Pharmacist **Pharmacist phone directory can now be found on [email or web address] ([email or web address]).  Listed under [facility].

## 2024-04-16 NOTE — Procedures (Signed)
  Procedure:  RUE HD graft declot, venous PTA   Preprocedure diagnosis: The primary encounter diagnosis was Malfunction of arteriovenous dialysis fistula, initial encounter (HCC). A diagnosis of ESRD (end stage renal disease) (HCC) was also pertinent to this visit. Postprocedure diagnosis: same EBL:    minimal Complications:   none immediate  See full dictation in [employer].  CHARM Toribio Faes MD Main # [phone] Pager  [phone] Mobile [phone]

## 2024-04-16 NOTE — Progress Notes (Signed)
 PROGRESS NOTE [name]  FMW:[id number] DOB: 1971/03/14 DOA: 04/15/2024 PCP: Catalina Bare, MD  Brief Narrative/Hospital Course: [name] is a 53 y.o. year old female with PMH of hypertension, hyperlipidemia, obesity, DM, PVD Asthma, atrial fibrillation on Eliquis  , multiple strokes ESRD ON hd, who has been having dialysis access issues sent to the emergency room from dialysis as they could not access the HD graft.  Her last dialysis Friday- she went for dialysis today but they could not acces her fistula to sent to ED. Patient otherwise denies any nausea, vomiting, chest pain, shortness of breath, fever, chills, headache, focal weakness, numbness tingling, speech difficulties. She complains of being hungry and asking for food, In the ED: bp IN 190S, LABS- bun/creat 75/10,Hco3 and K normal, HB 10 Nephrology consulted and admission requested.  Subjective: Seen this am Resting well no Chest pain or shortness of breath.   Overnight afebrile BP in 130s, on room air Labs with creatinine 3.3 bicarb 21 hemoglobin 8.6 Waiting for IR  Assessment and plan:  Problem with dialysis access ESRD on HD mwf: IR conulted. Nephro on consult-keeping n.p.o. holding Eliquis  as plan for de clot or new HD catheter today by IR. Last dialysis on Friday electrolytes so far is stable and no shortness of breath or evidence of hypervolemia continue Lasix   Uncontrolled type 2 diabetes mellitus with hyperglycemia, with long-term current use of insulin : Stable, continue low-dose of long-acting insulin , sliding scale insulin    Essential hypertension, benign Chronic heart failure with preserved ejection fraction (HCC) HtN Urgency: Blood pressure poorly controlled as she did not take her meds for HD.  Blood pressure stable continue home labetalol  and Lasix  and BiDil   Anemia of chronic disease Stable hb.monitor  CVA Hx: Mixed hyperlipidemia: Not taking statins, Eliquis  at home-holding for  procedure-discussed nephrology and Dr. Frederic Primrose from IR  Obstructive sleep apnea: Resume CPAP   Class I Body mass index is 31.91 kg/m.: Will benefit with PCP follow-up, weight loss,healthy lifestyle   DVT prophylaxis: heparin  injection 5,000 Units Start: 04/15/24 2200 SCDs Start: 04/15/24 1733 Code Status:   Code Status: Full Code Family Communication: plan of care discussed with patient at bedside. Patient status is: Remains hospitalized because of severity of illness Level of care: Telemetry Medical   Dispo: The patient is from: home            Anticipated disposition: TBD Objective: Vitals last 24 hrs: Vitals:   04/15/24 1813 04/15/24 1932 04/16/24 0007 04/16/24 0824  BP: (!) 198/92  137/74 (!) 149/88  Pulse: 76  66   Resp: 18   20  Temp: 98.1 F (36.7 C) 98 F (36.7 C) 98 F (36.7 C) 97.7 F (36.5 C)  TempSrc: Oral Oral Oral Oral  SpO2: 100%  98%   Weight:      Height:        Physical Examination: General exam: AAOX3 HEENT:Oral mucosa moist, Ear/Nose WNL grossly Respiratory system: Bilaterally clear BS,no use of accessory muscle Cardiovascular system: S1 & S2 +, No JVD. Gastrointestinal system: Abdomen soft,NT,ND, BS+ Nervous System: Alert, awake, moving all extremities,and following commands. Extremities: LE edema neg, distal extremities warm.  Right upper extremity HD access + Skin: No rashes,no icterus. MSK: Normal muscle bulk,tone, power   Medications reviewed:  Scheduled Meds:  fenofibrate   54 mg Oral Daily   furosemide   20 mg Oral Daily   heparin   5,000 Units Subcutaneous Q8H   insulin  aspart  0-5 Units Subcutaneous QHS   insulin  aspart  0-6 Units Subcutaneous TID WC   insulin  glargine  5 Units Subcutaneous Daily   isosorbide -hydrALAZINE   1 tablet Oral TID   labetalol   300 mg Oral BID   Continuous Infusions: Diet: Diet Order             Diet NPO time specified Except for: Sips with Meds  Diet effective now                     Data  Reviewed: I have personally reviewed following labs and imaging studies ( see epic result tab) CBC: Recent Labs  Lab 04/15/24 1343 04/15/24 1358 04/16/24 0419  WBC 7.7  --  7.8  HGB 9.9* 10.2* 8.6*  HCT 30.1* 30.0* 26.1*  MCV 102.4*  --  99.6  PLT 289  --  258   CMP: Recent Labs  Lab 04/15/24 1343 04/15/24 1358 04/15/24 1929 04/16/24 0419  NA 139 137  --  137  K 3.9 3.9  --  3.3*  CL 100 102  --  98  CO2 22  --   --  21*  GLUCOSE 191* 187*  --  258*  BUN 71* 75*  --  80*  CREATININE 9.40* 10.60* 9.85* 10.01*  CALCIUM  8.5*  --   --  8.1*   GFR: Estimated Creatinine Clearance: 8.3 mL/min (A) (by C-G formula based on SCr of 10.01 mg/dL (H)). Recent Labs  Lab 04/15/24 1343  AST 15  ALT 12  ALKPHOS 37*  BILITOT 0.5  PROT 7.3  ALBUMIN 3.4*   No results for input(s): LIPASE, AMYLASE in the last 168 hours. No results for input(s): AMMONIA in the last 168 hours. Coagulation Profile: No results for input(s): INR, PROTIME in the last 168 hours. Unresulted Labs (From admission, onward)    None      Antimicrobials/Microbiology: Anti-infectives (From admission, onward)    None         Component Value Date/Time   SDES WOUND RIGHT TOE 05/28/2021 1600   SPECREQUEST NONE 05/28/2021 1600   CULT  05/28/2021 1600    RARE NORMAL SKIN FLORA NO GROUP A STREP (S.PYOGENES) ISOLATED NO STAPHYLOCOCCUS AUREUS ISOLATED NO ANAEROBES ISOLATED Performed at [facility], [address], [place], [place] [address]    REPTSTATUS 06/02/2021 FINAL 05/28/2021 1600    Procedures:    Mennie LAMY, MD [facility] 04/16/2024, 11:15 AM

## 2024-04-16 NOTE — Progress Notes (Addendum)
 [name] is here under observation for clotted AVF. I was informed [facility] was originally contacted for de-clot but they were unable to accommodate. IR is on board. Plan for de-clot sometime today but may end up being done tomorrow morning. If de-clot is unsuccessful, patient will then need [facility] placement. Seen and examined patient at bedside. Last HD on 9/5 and she received full treatment. She's on RA and denies SOB, CP, and N/V. Current renal labs are stable. Not overloaded on exam. Placed HD orders in to at least get her on our schedule after access has been taken care of. Will perform a formal consult if patient ends up being admitted.  HD Orders: [facility]-MWF BFR 400, DFR Auto 1.5 EDW 98.3kg 4hrs 2K/2,5Ca Heparin  bolus 3000 units with HD  Charmaine Piety, NP [facility]

## 2024-04-17 ENCOUNTER — Observation Stay (HOSPITAL_COMMUNITY)

## 2024-04-17 ENCOUNTER — Encounter (HOSPITAL_COMMUNITY): Payer: Self-pay | Admitting: Radiology

## 2024-04-17 DIAGNOSIS — T82898A Other specified complication of vascular prosthetic devices, implants and grafts, initial encounter: Secondary | ICD-10-CM | POA: Diagnosis not present

## 2024-04-17 HISTORY — PX: IR TUNNELED CENTRAL VENOUS CATH PLC W IMG: IMG1939

## 2024-04-17 HISTORY — PX: IR PATIENT EVAL TECH 0-60 MINS: IMG5564

## 2024-04-17 LAB — CBC
HCT: 26.2 % — ABNORMAL LOW (ref 36.0–46.0)
Hemoglobin: 8.7 g/dL — ABNORMAL LOW (ref 12.0–15.0)
MCH: 33.3 pg (ref 26.0–34.0)
MCHC: 33.2 g/dL (ref 30.0–36.0)
MCV: 100.4 fL — ABNORMAL HIGH (ref 80.0–100.0)
Platelets: 264 K/uL (ref 150–400)
RBC: 2.61 MIL/uL — ABNORMAL LOW (ref 3.87–5.11)
RDW: 18 % — ABNORMAL HIGH (ref 11.5–15.5)
WBC: 7.7 K/uL (ref 4.0–10.5)
nRBC: 0.3 % — ABNORMAL HIGH (ref 0.0–0.2)

## 2024-04-17 LAB — HEPATITIS B SURFACE ANTIBODY, QUANTITATIVE: Hep B S AB Quant (Post): <3.5 m[IU]/mL — ABNORMAL LOW

## 2024-04-17 LAB — GLUCOSE, CAPILLARY
Glucose-Capillary: 150 mg/dL — ABNORMAL HIGH (ref 70–99)
Glucose-Capillary: 171 mg/dL — ABNORMAL HIGH (ref 70–99)
Glucose-Capillary: 249 mg/dL — ABNORMAL HIGH (ref 70–99)
Glucose-Capillary: 127 mg/dL — ABNORMAL HIGH (ref 70–99)

## 2024-04-17 LAB — APTT: aPTT: 37 s — ABNORMAL HIGH (ref 24–36)

## 2024-04-17 LAB — HEPARIN LEVEL (UNFRACTIONATED)
Heparin Unfractionated: 0.16 [IU]/mL — ABNORMAL LOW (ref 0.30–0.70)
Heparin Unfractionated: 0.18 [IU]/mL — ABNORMAL LOW (ref 0.30–0.70)

## 2024-04-17 MED ORDER — CEFAZOLIN SODIUM-DEXTROSE 2-4 GM/100ML-% IV SOLN
2.0000 g | Freq: Once | INTRAVENOUS | Status: AC
  Administered 2024-04-17: 2 g via INTRAVENOUS
  Filled 2024-04-17: qty 100

## 2024-04-17 MED ORDER — HEPARIN (PORCINE) 25000 UT/250ML-% IV SOLN
1650.0000 [IU]/h | INTRAVENOUS | Status: DC
  Administered 2024-04-17: 1450 [IU]/h via INTRAVENOUS
  Administered 2024-04-18: 1650 [IU]/h via INTRAVENOUS
  Filled 2024-04-17 (×2): qty 250

## 2024-04-17 MED ORDER — FENTANYL CITRATE (PF) 100 MCG/2ML IJ SOLN
INTRAMUSCULAR | Status: AC
  Filled 2024-04-17: qty 2

## 2024-04-17 MED ORDER — FENTANYL CITRATE (PF) 100 MCG/2ML IJ SOLN
INTRAMUSCULAR | Status: AC | PRN
  Administered 2024-04-17 (×2): 25 ug via INTRAVENOUS

## 2024-04-17 MED ORDER — LIDOCAINE-EPINEPHRINE 1 %-1:100000 IJ SOLN
INTRAMUSCULAR | Status: AC
  Filled 2024-04-17: qty 1

## 2024-04-17 MED ORDER — MIDAZOLAM HCL 2 MG/2ML IJ SOLN
INTRAMUSCULAR | Status: AC
  Filled 2024-04-17: qty 2

## 2024-04-17 MED ORDER — HEPARIN SODIUM (PORCINE) 1000 UNIT/ML IJ SOLN
INTRAMUSCULAR | Status: AC
  Filled 2024-04-17: qty 10

## 2024-04-17 MED ORDER — MIDAZOLAM HCL 2 MG/2ML IJ SOLN
INTRAMUSCULAR | Status: AC | PRN
  Administered 2024-04-17: .5 mg via INTRAVENOUS
  Administered 2024-04-17: 1 mg via INTRAVENOUS

## 2024-04-17 NOTE — Progress Notes (Signed)
 PROGRESS NOTE  DOSHA BROSHEARS FMW:[id number] DOB: 18-Feb-1971 DOA: 04/15/2024 PCP: Catalina Bare, MD  HPI/Recap of past 24 hours: [name] is a 53 y.o. year old female with PMH of hypertension, hyperlipidemia, obesity, DM, PVD Asthma, atrial fibrillation on Eliquis  , multiple strokes ESRD ON hd, who has been having dialysis access issues sent to the emergency room from dialysis as they could not access the HD fistula. Nephrology consulted.  Patient admitted for further management.  On 9/9, right upper extremity graft declot was attempted but unsuccessful by IR.  On 9/10, TDC was placed by IR.   Today, patient denied any new complaints.  Awaiting to have HD after [facility] was placed.   Assessment/Plan: Principal Problem:   Problem with dialysis access [facility]) Active Problems:   Uncontrolled type 2 diabetes mellitus with hyperglycemia, with long-term current use of insulin  (HCC)   Essential hypertension, benign   Chronic heart failure with preserved ejection fraction (HCC)   Anemia of chronic disease   Cerebrovascular accident (CVA) (HCC)   Mixed hyperlipidemia   Obstructive sleep apnea   Hypertensive urgency   Clotted dialysis access [facility])   Noted dialysis access ESRD on HD mwf On 9/9, right upper extremity graft declot was attempted but unsuccessful by IR.  On 9/10, TDC was placed by IR. Plan for HD on 9/10 Discussed with vascular surgery on 9/10, outpatient follow-up for further management  Hypokalemia Replace as needed   Uncontrolled type 2 diabetes mellitus with hyperglycemia, with long-term current use of insulin  Stable, continue low-dose of long-acting insulin , sliding scale insulin     Essential hypertension, benign Chronic heart failure with preserved ejection fraction (HCC) HtN Urgency BP stable  Continue home labetalol , Lasix  and BiDil    Anemia of chronic disease Stable hb.monitor   CVA Hx: Mixed hyperlipidemia: Not taking statins, Eliquis  at  home-holding for now Continue IV heparin    Obstructive sleep apnea: Resume CPAP  Obesity class I Lifestyle modification advised    Estimated body mass index is 31.91 kg/m as calculated from the following:   Height as of this encounter: 5' 9 (1.753 m).   Weight as of this encounter: 98 kg.     Code Status: Full  Family Communication: None at bedside  Disposition Plan: Status is: Observation The patient remains OBS appropriate and will d/c before 2 midnights.      Consultants: Nephrology IR  Procedures: As above  Antimicrobials: None  DVT prophylaxis: Heparin    Objective: Vitals:   04/17/24 1230 04/17/24 1330 04/17/24 1659 04/17/24 1955  BP: (!) 132/56 (!) 141/61 (!) 171/84 124/66  Pulse: 75 78 72 76  Resp: 16 17 17 15   Temp:   97.7 F (36.5 C) 97.8 F (36.6 C)  TempSrc:   Oral Oral  SpO2: 95% 96% 100% 97%  Weight:      Height:        Intake/Output Summary (Last 24 hours) at 04/17/2024 1957 Last data filed at 04/17/2024 1600 Gross per 24 hour  Intake 358.5 ml  Output --  Net 358.5 ml   Filed Weights   04/15/24 1342  Weight: 98 kg    Exam: General: NAD  Cardiovascular: S1, S2 present Respiratory: CTAB Abdomen: Soft, nontender, nondistended, bowel sounds present Musculoskeletal: No bilateral pedal edema noted Skin: Normal Psychiatry: Normal mood     Data Reviewed: CBC: Recent Labs  Lab 04/15/24 1343 04/15/24 1358 04/16/24 0419 04/17/24 0757  WBC 7.7  --  7.8 7.7  HGB 9.9* 10.2* 8.6* 8.7*  HCT 30.1* 30.0* 26.1* 26.2*  MCV 102.4*  --  99.6 100.4*  PLT 289  --  258 264   Basic Metabolic Panel: Recent Labs  Lab 04/15/24 1343 04/15/24 1358 04/15/24 1929 04/16/24 0419  NA 139 137  --  137  K 3.9 3.9  --  3.3*  CL 100 102  --  98  CO2 22  --   --  21*  GLUCOSE 191* 187*  --  258*  BUN 71* 75*  --  80*  CREATININE 9.40* 10.60* 9.85* 10.01*  CALCIUM  8.5*  --   --  8.1*   GFR: Estimated Creatinine Clearance: 8.3 mL/min (A)  (by C-G formula based on SCr of 10.01 mg/dL (H)). Liver Function Tests: Recent Labs  Lab 04/15/24 1343  AST 15  ALT 12  ALKPHOS 37*  BILITOT 0.5  PROT 7.3  ALBUMIN 3.4*   No results for input(s): LIPASE, AMYLASE in the last 168 hours. No results for input(s): AMMONIA in the last 168 hours. Coagulation Profile: No results for input(s): INR, PROTIME in the last 168 hours. Cardiac Enzymes: No results for input(s): CKTOTAL, CKMB, CKMBINDEX, TROPONINI in the last 168 hours. BNP (last 3 results) No results for input(s): PROBNP in the last 8760 hours. HbA1C: Recent Labs    04/15/24 1929  HGBA1C 7.9*   CBG: Recent Labs  Lab 04/16/24 1631 04/16/24 2118 04/17/24 0642 04/17/24 1152 04/17/24 1701  GLUCAP 151* 295* 150* 171* 249*   Lipid Profile: No results for input(s): CHOL, HDL, LDLCALC, TRIG, CHOLHDL, LDLDIRECT in the last 72 hours. Thyroid  Function Tests: No results for input(s): TSH, T4TOTAL, FREET4, T3FREE, THYROIDAB in the last 72 hours. Anemia Panel: No results for input(s): VITAMINB12, FOLATE, FERRITIN, TIBC, IRON , RETICCTPCT in the last 72 hours. Urine analysis:    Component Value Date/Time   COLORURINE YELLOW 04/15/2023 1539   APPEARANCEUR HAZY (A) 04/15/2023 1539   LABSPEC 1.012 04/15/2023 1539   PHURINE 5.0 04/15/2023 1539   GLUCOSEU 50 (A) 04/15/2023 1539   HGBUR NEGATIVE 04/15/2023 1539   BILIRUBINUR NEGATIVE 04/15/2023 1539   KETONESUR NEGATIVE 04/15/2023 1539   PROTEINUR >=300 (A) 04/15/2023 1539   UROBILINOGEN 0.2 08/25/2011 2043   NITRITE NEGATIVE 04/15/2023 1539   LEUKOCYTESUR NEGATIVE 04/15/2023 1539   Sepsis Labs: @LABRCNTIP (procalcitonin:4,lacticidven:4)  )No results found for this or any previous visit (from the past 240 hours).    Studies: IR TUNNELED CENTRAL VENOUS CATH [facility] W IMG Result Date: 04/17/2024 CLINICAL DATA:  End-stage renal disease on hemodialysis, inability to use right  upper arm shunt post declot, additional access requested EXAM: TUNNELED HEMODIALYSIS CATHETER PLACEMENT WITH ULTRASOUND AND FLUOROSCOPIC GUIDANCE TECHNIQUE: The procedure, risks, benefits, and alternatives were explained to the patient. Questions regarding the procedure were encouraged and answered. The patient understands and consents to the procedure. As antibiotic prophylaxis, cefazolin  2 g was ordered pre-procedure and administered intravenously within one hour of incision.Patency of the right IJ vein was confirmed with ultrasound with image documentation. An appropriate skin site was determined. Region was prepped using maximum barrier technique including cap and mask, sterile gown, sterile gloves, large sterile sheet, and Chlorhexidine  as cutaneous antisepsis. The region was infiltrated locally with 1% lidocaine . Intravenous Fentanyl  50mcg and Versed  1.5mg  were administered by RN during a total moderate (conscious) sedation time of 12 minutes; the patient's level of consciousness and physiological / cardiorespiratory status were monitored continuously by radiology RN under my direct supervision. Under real-time ultrasound guidance, the right IJ vein was accessed with a 21  gauge micropuncture needle; the needle tip within the vein was confirmed with ultrasound image documentation. Needle exchanged over the 018 guidewire for transitional dilator, which allowed advancement of a Benson wire into the IVC. Over this, an MPA catheter was advanced. A Palindrome 19 hemodialysis catheter was tunneled from the right anterior chest wall approach to the right IJ dermatotomy site. The MPA catheter was exchanged over an Amplatz wire for serial vascular dilators which allow placement of a peel-away sheath, through which the catheter was advanced under intermittent fluoroscopy, positioned with its tips in the proximal and midright atrium. Spot chest radiograph confirms good catheter position. No pneumothorax. Catheter was  flushed and primed per protocol. Catheter secured externally with O Prolene sutures. The right IJ dermatotomy site was closed with Dermabond. COMPLICATIONS: COMPLICATIONS None immediate FLUOROSCOPY: Radiation Exposure Index (as provided by the fluoroscopic device): 2.4 mGy air Kerma COMPARISON:  None Available. IMPRESSION: 1. Technically successful placement of tunneled right IJ hemodialysis catheter with ultrasound and fluoroscopic guidance. Ready for routine use. ACCESS: Remains approachable for percutaneous intervention as needed. Electronically Signed   By: JONETTA Faes M.D.   On: 04/17/2024 16:57   IR PATIENT EVAL TECH 0-60 MINS Result Date: 04/17/2024 Herold Ozell HERO, RT     04/17/2024  2:59 PM RN called down about Tunneled dialysis catheter bleeding at insertion site. IR went to patient's room to assess the catheter site. Catheter had some blood around it that had clotted. Catheter site was cleaned. Hemostasis was observed. New dressing was placed.   Scheduled Meds:  Chlorhexidine  Gluconate Cloth  6 each Topical Q0600   fenofibrate   54 mg Oral Daily   furosemide   20 mg Oral Daily   insulin  aspart  0-5 Units Subcutaneous QHS   insulin  aspart  0-6 Units Subcutaneous TID WC   insulin  glargine  5 Units Subcutaneous Daily   isosorbide -hydrALAZINE   1 tablet Oral TID   labetalol   300 mg Oral BID    Continuous Infusions:  heparin  1,450 Units/hr (04/17/24 1201)     LOS: 0 days     Lebron JINNY Cage, MD [facility]  If 7PM-7AM, please contact night-coverage [email or web address] 04/17/2024, 7:57 PM

## 2024-04-17 NOTE — Consult Note (Signed)
 [facility] Renal Consultation Note    Indication for Consultation:  Management of ESRD/hemodialysis; anemia, hypertension/volume and secondary hyperparathyroidism  ERE:Ndzp-Anwdl, Zachary, MD  HPI: [name] is a 53 y.o. female with ESRD on HD MWF at [facility].  Patient presented to the ED yesterday due to clotted AVG. S/p declot by IR yesterday, however, AVG continues to malfunction during hemodialysis overnight.  Treatment was not completed overnight and patient was returned back to room.  Plan for [facility] placement today.  Seen and examined patient at bedside.  She continues to feel well and denies SOB, CP, nausea, and vomiting.  Last renal labs and vital signs are stable.  Plan for hemodialysis after [facility] placement today.  Hopeful she will be able to be discharged after dialysis.  Past Medical History:  Diagnosis Date   Anemia    Anxiety    Arthritis    Asthma    Atrial fib/flutter, transient (HCC) 01/2024   pt states that she was dx in [place]   Cataract    Mixed form OU   CKD (chronic kidney disease)    Coronary artery disease    Depression    Diabetes mellitus    Diabetic retinopathy (HCC)    NPDR OU   Dyspnea    Hyperlipidemia    Hypertension    Hypertensive retinopathy    OU   Left thyroid  nodule    diagnosed 07/2018   PAC (premature atrial contraction) 02/15/2021   Pseudotumor cerebri    Sleep apnea    Uses a cpap   Stroke [facility])    Vitamin D deficiency    Past Surgical History:  Procedure Laterality Date   A/V FISTULAGRAM N/A 08/16/2023   Procedure: A/V Fistulagram;  Surgeon: Melia Lynwood ORN, MD;  Location: [facility] INVASIVE CV LAB;  Service: Cardiovascular;  Laterality: N/A;   ACHILLES TENDON REPAIR Right    ACHILLES TENDON SURGERY Left 02/19/2021   Procedure: ACHILLES TENDON REPAIR WITH GRAFT;  Surgeon: Tobie Franky SQUIBB, DPM;  Location: [facility];  Service: Podiatry;  Laterality: Left;  BLOCK   AMPUTATION TOE Right 05/28/2021    Procedure: AMPUTATION TOE;  Surgeon: Joya Stabs, MD;  Location: [facility] OR;  Service: Podiatry;  Laterality: Right;  Surgical team will do block   AV FISTULA PLACEMENT Left 07/29/2022   Procedure: LEFT ARTERIOVENOUS (AV) FISTULA CREATION;  Surgeon: Sheree Penne Bruckner, MD;  Location: [facility] OR;  Service: Vascular;  Laterality: Left;   AV FISTULA PLACEMENT Right 04/04/2023   Procedure: RIGHT ARM BRACHIOCEPHALIC ARTERIOVENOUS (AV) FISTULA CREATION;  Surgeon: Lanis Fonda FORBES, MD;  Location: [facility] OR;  Service: Vascular;  Laterality: Right;  With regional block   GASTROC RECESSION EXTREMITY Left 02/19/2021   Procedure: GASTROC RECESSION EXTREMITY;  Surgeon: Tobie Franky SQUIBB, DPM;  Location: [facility];  Service: Podiatry;  Laterality: Left;  Block   INSERTION OF DIALYSIS CATHETER Left 04/20/2023   Procedure: INSERTION OFTUNNELED  DIALYSIS CATHETER;  Surgeon: Lanis Fonda FORBES, MD;  Location: [facility] OR;  Service: Vascular;  Laterality: Left;   IR THROMBECTOMY AV FISTULA W/THROMBOLYSIS/PTA INC/SHUNT/IMG RIGHT Right 04/16/2024   IR US  GUIDE VASC ACCESS RIGHT  04/16/2024   LIGATION OF ARTERIOVENOUS  FISTULA Right 11/17/2023   Procedure: LIGATION OF ARTERIOVENOUS  FISTULA;  Surgeon: Lanis Fonda FORBES, MD;  Location: [facility] OR;  Service: Vascular;  Laterality: Right;   PERIPHERAL VASCULAR BALLOON ANGIOPLASTY Right 08/16/2023   Procedure: PERIPHERAL VASCULAR BALLOON ANGIOPLASTY;  Surgeon: Melia Lynwood ORN,  MD;  [facility]: [facility] IN[facility];  Service: Cardiovascular;  Laterality: Right;  innominate   REVISON OF ARTERIOVENOUS FISTULA Right 11/17/2023   Procedure: REVISON OF ARTERIOVENOUS FISTULA;  Surgeon: Lanis Fonda BRAVO, MD;  Location: [facility] OR;  Service: Vascular;  Laterality: Right;   TOE SURGERY Left 01/2023   TUBAL LIGATION     Family History  Problem Relation Age of Onset   Asthma Mother    Diabetes Father    Hypertension Father    Kidney disease Father    Heart attack Father    Heart failure Father     Hypertension Sister    Asthma Sister    Congestive Heart Failure Maternal Aunt    Diabetes Maternal Grandmother    Congestive Heart Failure Maternal Grandmother    Lung disease Maternal Grandmother    Asthma Other    Hyperlipidemia Other    Hypertension Other    Cancer Other    BRCA 1/2 Neg Hx    Breast cancer Neg Hx    Social History:  reports that she quit smoking about 8 years ago. Her smoking use included cigarettes. She started smoking about 38 years ago. She has a 15 pack-year smoking history. She has been exposed to tobacco smoke. She has never used smokeless tobacco. She reports that she does not currently use alcohol. She reports that she does not currently use drugs after having used the following drugs: Marijuana. Allergies  Allergen Reactions   Bee Pollen Other (See Comments)    Seasonal Allergies   Dust Mite Extract Cough    Sneezing & Cough   Mixed Ragweed Itching   Prior to Admission medications   Medication Sig Start Date End Date Taking? Authorizing Provider  albuterol  (PROVENTIL ) (2.5 MG/3ML) 0.083% nebulizer solution Take 2.5 mg by nebulization every 6 (six) hours as needed for wheezing or shortness of breath. 08/10/18  Yes [provider]  albuterol  (VENTOLIN  HFA) 108 (90 Base) MCG/ACT inhaler Inhale 1-2 puffs into the lungs every 6 (six) hours as needed for wheezing or shortness of breath (Asthma).   Yes [provider]  apixaban  (ELIQUIS ) 5 MG TABS tablet Take 1 tablet (5 mg total) by mouth 2 (two) times daily. 03/26/24  Yes Patwardhan, Manish J, MD  diltiazem  (CARDIZEM ) 30 MG tablet Take 1 tablet (30 mg total) by mouth 3 (three) times daily as needed. 03/26/24  Yes Patwardhan, Manish J, MD  fenofibrate  (TRICOR ) 145 MG tablet Take 145 mg by mouth daily. 08/23/23  Yes [provider]  FLONASE ALLERGY RELIEF 50 MCG/ACT nasal spray Place 1 spray into both nostrils daily.   Yes [provider]  furosemide  (LASIX ) 20 MG tablet Take 20 mg  by mouth daily.   Yes [provider]  insulin  aspart protamine - aspart (NOVOLOG  70/30 MIX) (70-30) 100 UNIT/ML FlexPen Inject 10 Units into the skin 2 (two) times daily with a meal. 04/22/23  Yes Fairy Frames, MD  insulin  glargine (LANTUS ) 100 UNIT/ML injection Inject 10 Units into the skin at bedtime.   Yes [provider]  isosorbide -hydrALAZINE  (BIDIL ) 20-37.5 MG tablet Take 1 tablet by mouth 3 (three) times daily. 04/23/22  Yes Gonfa, Taye T, MD  labetalol  (NORMODYNE ) 300 MG tablet Take 1 tablet (300 mg total) by mouth 2 (two) times daily. 03/26/24  Yes Patwardhan, Manish J, MD  LOVAZA 1 g capsule Take 2 g by mouth 2 (two) times daily.   Yes [provider]  rosuvastatin  (CRESTOR ) 40 MG tablet Take 40  mg by mouth in the morning.   Yes [provider]  Blood Pressure Monitor DEVI 1 Device by Does not apply route daily. 07/17/23   Patwardhan, Newman PARAS, MD  Continuous Blood Gluc Receiver (DEXCOM G7 RECEIVER) DEVI Use to monitor BG continuously 04/13/22   Lenis Ethelle ORN, MD  Continuous Glucose Sensor (DEXCOM G7 SENSOR) MISC CHANGE SENSOR EVERY 10 DAYS 12/01/23   Lenis Ethelle ORN, MD  Insulin  Pen Needle 32G X 4 MM MISC Use to inject insulin  twice daily 01/23/23   Lenis Ethelle ORN, MD   Current Facility-Administered Medications  Medication Dose Route Frequency Provider Last Rate Last Admin   acetaminophen  (TYLENOL ) tablet 650 mg  650 mg Oral Q6H PRN Kc, Ramesh, MD       Or   acetaminophen  (TYLENOL ) suppository 650 mg  650 mg Rectal Q6H PRN Kc, Ramesh, MD       albuterol  (PROVENTIL ) (2.5 MG/3ML) 0.083% nebulizer solution 2.5 mg  2.5 mg Nebulization Q6H PRN Kc, Ramesh, MD       ceFAZolin  (ANCEF ) IVPB 2g/100 mL premix  2 g Intravenous Once Lucila Delon BROCKS, NP       Chlorhexidine  Gluconate Cloth 2 % PADS 6 each  6 each Topical Q0600 Lenon Charmaine BRAVO, NP   6 each at 04/17/24 0750   fenofibrate  tablet 54 mg  54 mg Oral Daily Kc, Mennie, MD   54  mg at 04/16/24 1114   furosemide  (LASIX ) tablet 20 mg  20 mg Oral Daily Kc, Mennie, MD   20 mg at 04/16/24 1114   hydrALAZINE  (APRESOLINE ) injection 5 mg  5 mg Intravenous Q4H PRN Christobal Mennie, MD       insulin  aspart (novoLOG ) injection 0-5 Units  0-5 Units Subcutaneous QHS Kc, Mennie, MD   3 Units at 04/16/24 2257   insulin  aspart (novoLOG ) injection 0-6 Units  0-6 Units Subcutaneous TID WC Christobal Mennie, MD   1 Units at 04/16/24 1714   insulin  glargine (LANTUS ) injection 5 Units  5 Units Subcutaneous Daily Kc, Mennie, MD       isosorbide -hydrALAZINE  (BIDIL ) 20-37.5 MG per tablet 1 tablet  1 tablet Oral TID Christobal Mennie, MD   1 tablet at 04/17/24 0236   labetalol  (NORMODYNE ) tablet 300 mg  300 mg Oral BID Christobal Mennie, MD   300 mg at 04/17/24 0236   Labs: Basic Metabolic Panel: Recent Labs  Lab 04/15/24 1343 04/15/24 1358 04/15/24 1929 04/16/24 0419  NA 139 137  --  137  K 3.9 3.9  --  3.3*  CL 100 102  --  98  CO2 22  --   --  21*  GLUCOSE 191* 187*  --  258*  BUN 71* 75*  --  80*  CREATININE 9.40* 10.60* 9.85* 10.01*  CALCIUM  8.5*  --   --  8.1*   Liver Function Tests: Recent Labs  Lab 04/15/24 1343  AST 15  ALT 12  ALKPHOS 37*  BILITOT 0.5  PROT 7.3  ALBUMIN 3.4*   No results for input(s): LIPASE, AMYLASE in the last 168 hours. No results for input(s): AMMONIA in the last 168 hours. CBC: Recent Labs  Lab 04/15/24 1343 04/15/24 1358 04/16/24 0419 04/17/24 0757  WBC 7.7  --  7.8 7.7  HGB 9.9* 10.2* 8.6* 8.7*  HCT 30.1* 30.0* 26.1* 26.2*  MCV 102.4*  --  99.6 100.4*  PLT 289  --  258 264   Cardiac Enzymes: No results for input(s): CKTOTAL, CKMB, CKMBINDEX, TROPONINI in  the last 168 hours. CBG: Recent Labs  Lab 04/15/24 2142 04/16/24 1138 04/16/24 1631 04/16/24 2118 04/17/24 0642  GLUCAP 199* 129* 151* 295* 150*   Iron  Studies: No results for input(s): IRON , TIBC, TRANSFERRIN, FERRITIN in the last 72 hours. Studies/Results: IR US  Guide  Vasc Access Right Result Date: 04/17/2024 CLINICAL DATA:  Occluded dialysis graft COMPARISON:  None Available. EXAM: EXAM DIALYSIS GRAFT DECLOT VENOUS ANGIOPLASTY ULTRASOUND GUIDANCE FOR VASCULAR ACCESS X2 TECHNIQUE: The procedure, risks (including but not limited to bleeding, infection, organ damage ), benefits, and alternatives were explained to the patient. Questions regarding the procedure were encouraged and answered. The patient understands and consents to the procedure. Intravenous Fentanyl  100mcg and Versed  3mg  were administered by RN during a total moderate (conscious) sedation time of 68 minutes; the patient's level of consciousness and physiological / cardiorespiratory status were monitored continuously by radiology RN under my direct supervision. The fistula just central to the arterial anastomosis was accessed antegrade with a 21-gauge micropuncture needle under real-time ultrasonic guidance after the overlying skin prepped with chlorhexidine , draped in usual sterile fashion, infiltrated locally with 1% lidocaine . Needle exchanged over 018 guidewire for transitional dilator through which 2 mg t-PA was administered. Ultrasound imaging documentation was saved. Through the dilator, a Bentson wire was advanced to the venous anastomosis. Over this a 20F sheath was placed, through which a 5 [place] Kumpe catheter was advanced for outflow venography. This showed patency of the outflow venous system through the SVC. 3000 units heparin  were administered. The AngioJetT ZelanteDVT thrombectomy catheter was used to macerate thrombus in the graft. In similar fashion, the graft more centrally was accessed retrograde under ultrasound with a micropuncture needle, exchanged for a transitional dilator. The dilator was exchanged in similar fashion for a 6 French vascular sheath. The Kumpe catheter was advanced across the arterial anastomosis into the afferent brachial artery. Catheter exchanged for an over the wire Fogarty  balloon catheter, used to dislodge the platelet plug into the graft, where it was macerated. The AngioJetT ZelanteDVT thrombectomy catheter was used again to remove any residual platelet plug or clot from the graft central to the arterial anastomosis.Reflux across the arterial anastomosis demonstrate this to be widely patent, with unremarkable appearance of the visualized native arterial circulation. Injection showed partially occlusive thrombus more centrally in the graft limiting outflow. Thrombus from the graft. Balloon angioplasty of the graft and venous outflow was performed using a 7 mm x 4 cm Conquest angioplasty balloon with good response. Balloon was removed and injection showed patency of the anastomosis, without extravasation or other apparent complication. A follow shuntogram was performed, demonstrating good flow through the outflow vein, no extravasation. The catheter and sheaths were then removed and hemostasis achieved with 2-0 Ethilon sutures. Patient tolerated procedure well. FLUOROSCOPY: Radiation Exposure Index (as provided by the fluoroscopic device): 43.2 mGy air Kerma COMPLICATIONS: COMPLICATIONS none IMPRESSION: 1. Technically successful declot of right upper arm straight synthetic hemodialysis graft. 2. Technically successful balloon angioplasty of venous anastomotic stenosis. ACCESS: Remove pursestring sutures at next hemodialysis session after today. Electronically Signed   By: JONETTA Faes M.D.   On: 04/17/2024 10:05   IR THROMBECTOMY AV FISTULA/W THROMBOLYSIS/PTA INC SHUNT/IMG RIGHT Result Date: 04/16/2024 CLINICAL DATA:  Occluded dialysis graft COMPARISON:  None Available. EXAM: EXAM DIALYSIS GRAFT DECLOT VENOUS ANGIOPLASTY ULTRASOUND GUIDANCE FOR VASCULAR ACCESS X2 TECHNIQUE: The procedure, risks (including but not limited to bleeding, infection, organ damage ), benefits, and alternatives were explained to the patient. Questions regarding the procedure were  encouraged and answered. The  patient understands and consents to the procedure. Intravenous Fentanyl  100mcg and Versed  3mg  were administered by RN during a total moderate (conscious) sedation time of 68 minutes; the patient's level of consciousness and physiological / cardiorespiratory status were monitored continuously by radiology RN under my direct supervision. The fistula just central to the arterial anastomosis was accessed antegrade with a 21-gauge micropuncture needle under real-time ultrasonic guidance after the overlying skin prepped with chlorhexidine , draped in usual sterile fashion, infiltrated locally with 1% lidocaine . Needle exchanged over 018 guidewire for transitional dilator through which 2 mg t-PA was administered. Ultrasound imaging documentation was saved. Through the dilator, a Bentson wire was advanced to the venous anastomosis. Over this a 44F sheath was placed, through which a 5 [place] Kumpe catheter was advanced for outflow venography. This showed patency of the outflow venous system through the SVC. 3000 units heparin  were administered. The AngioJetT ZelanteDVT thrombectomy catheter was used to macerate thrombus in the graft. In similar fashion, the graft more centrally was accessed retrograde under ultrasound with a micropuncture needle, exchanged for a transitional dilator. The dilator was exchanged in similar fashion for a 6 French vascular sheath. The Kumpe catheter was advanced across the arterial anastomosis into the afferent brachial artery. Catheter exchanged for an over the wire Fogarty balloon catheter, used to dislodge the platelet plug into the graft, where it was macerated. The AngioJetT ZelanteDVT thrombectomy catheter was used again to remove any residual platelet plug or clot from the graft central to the arterial anastomosis.Reflux across the arterial anastomosis demonstrate this to be widely patent, with unremarkable appearance of the visualized native arterial circulation. Injection showed partially  occlusive thrombus more centrally in the graft limiting outflow. Thrombus from the graft. Balloon angioplasty of the graft and venous outflow was performed using a 7 mm x 4 cm Conquest angioplasty balloon with good response. Balloon was removed and injection showed patency of the anastomosis, without extravasation or other apparent complication. A follow shuntogram was performed, demonstrating good flow through the outflow vein, no extravasation. The catheter and sheaths were then removed and hemostasis achieved with 2-0 Ethilon sutures. Patient tolerated procedure well. FLUOROSCOPY: Radiation Exposure Index (as provided by the fluoroscopic device): 43.2 mGy air Kerma COMPLICATIONS: COMPLICATIONS none IMPRESSION: 1. Technically successful declot of right upper arm straight synthetic hemodialysis graft. 2. Technically successful balloon angioplasty of venous anastomotic stenosis. ACCESS: Remove pursestring sutures at next hemodialysis session after today. Electronically Signed   By: JONETTA Faes M.D.   On: 04/16/2024 15:46    ROS: All others negative except those listed in HPI.   Physical Exam: Vitals:   04/16/24 1700 04/16/24 1811 04/16/24 2300 04/17/24 0751  BP: 134/81 133/69  (!) 140/82  Pulse: 68 72  78  Resp:  15  17  Temp: 97.6 F (36.4 C) (!) 97.5 F (36.4 C) 98.2 F (36.8 C) 97.8 F (36.6 C)  TempSrc: Oral  Axillary Oral  SpO2:  100%  100%  Weight:      Height:         General: Awake, alert, NAD Head: Sclera not icteric  Lungs: CTA bilaterally. No wheeze, rales or rhonchi. Breathing is unlabored. Heart: RRR. No murmur, rubs or gallops.  Abdomen: soft and non-tender Lower extremities: no LE edema Neuro: AAOx3. Moves all extremities spontaneously. Dialysis Access: S/p L AVG de-clot but continues to malfunction.  Dialysis Orders:  [facility]-MWF BFR 400, DFR Auto 1.5 EDW 98.3kg 4hrs 2K/2,5Ca Heparin  bolus 3000  units with HD  Assessment/Plan: AVG malfunction -  S/p de-clot in IR yesterday but access continues to not work. Plan for [facility] placement in IR today.  Vascular Access - Will refer to [facility] to evaluate current access in outpatient  ESRD - on HD MWF. Plan for HD after [facility] placement  Hypertension/volume  - Euvolemic and Bps are stable  Anemia of CKD - Hgb 8.7. ESA not due yet. Last dose given 9/1 in outpatient Secondary Hyperparathyroidism -  Resume binders in outpatient Dispo - Okay for discharge from a renal standpoint after [facility] placement AND hemodialysis.  Charmaine Piety, NP [facility] 04/17/2024, 10:16 AM

## 2024-04-17 NOTE — Progress Notes (Signed)
 PHARMACY - ANTICOAGULATION CONSULT NOTE  Pharmacy Consult for heparin  Indication: atrial fibrillation  Allergies  Allergen Reactions   Bee Pollen Other (See Comments)    Seasonal Allergies   Dust Mite Extract Cough    Sneezing & Cough   Mixed Ragweed Itching    Patient Measurements: Height: 5' 9 (175.3 cm) Weight: 98 kg (216 lb 0.8 oz) IBW/kg (Calculated) : 66.2 HEPARIN  DW (KG): 87.3  Vital Signs: Temp: 97.8 F (36.6 C) (09/10 0751) Temp Source: Oral (09/10 0751) BP: 145/79 (09/10 1145) Pulse Rate: 69 (09/10 1145)  Labs: Recent Labs    04/15/24 1343 04/15/24 1358 04/15/24 1929 04/16/24 0419 04/17/24 0757  HGB 9.9* 10.2*  --  8.6* 8.7*  HCT 30.1* 30.0*  --  26.1* 26.2*  PLT 289  --   --  258 264  APTT  --   --   --   --  37*  HEPARINUNFRC  --   --   --   --  0.16*  CREATININE 9.40* 10.60* 9.85* 10.01*  --     Estimated Creatinine Clearance: 8.3 mL/min (A) (by C-G formula based on SCr of 10.01 mg/dL (H)).   Medical History: Past Medical History:  Diagnosis Date   Anemia    Anxiety    Arthritis    Asthma    Atrial fib/flutter, transient (HCC) 01/2024   pt states that she was dx in [place]   Cataract    Mixed form OU   CKD (chronic kidney disease)    Coronary artery disease    Depression    Diabetes mellitus    Diabetic retinopathy (HCC)    NPDR OU   Dyspnea    Hyperlipidemia    Hypertension    Hypertensive retinopathy    OU   Left thyroid  nodule    diagnosed 07/2018   PAC (premature atrial contraction) 02/15/2021   Pseudotumor cerebri    Sleep apnea    Uses a cpap   Stroke (HCC)    Vitamin D deficiency     Medications:  Medications Prior to Admission  Medication Sig Dispense Refill Last Dose/Taking   albuterol  (PROVENTIL ) (2.5 MG/3ML) 0.083% nebulizer solution Take 2.5 mg by nebulization every 6 (six) hours as needed for wheezing or shortness of breath.   Unknown   albuterol  (VENTOLIN  HFA) 108 (90 Base) MCG/ACT inhaler Inhale 1-2 puffs into  the lungs every 6 (six) hours as needed for wheezing or shortness of breath (Asthma).   Unknown   apixaban  (ELIQUIS ) 5 MG TABS tablet Take 1 tablet (5 mg total) by mouth 2 (two) times daily. 180 tablet 3 04/15/2024 at  7:00 AM   diltiazem  (CARDIZEM ) 30 MG tablet Take 1 tablet (30 mg total) by mouth 3 (three) times daily as needed. 90 tablet 1 Unknown   fenofibrate  (TRICOR ) 145 MG tablet Take 145 mg by mouth daily.   04/14/2024   FLONASE ALLERGY RELIEF 50 MCG/ACT nasal spray Place 1 spray into both nostrils daily.   Past Week   furosemide  (LASIX ) 20 MG tablet Take 20 mg by mouth daily.   04/14/2024   insulin  aspart protamine - aspart (NOVOLOG  70/30 MIX) (70-30) 100 UNIT/ML FlexPen Inject 10 Units into the skin 2 (two) times daily with a meal.   04/14/2024   insulin  glargine (LANTUS ) 100 UNIT/ML injection Inject 10 Units into the skin at bedtime.   04/14/2024   isosorbide -hydrALAZINE  (BIDIL ) 20-37.5 MG tablet Take 1 tablet by mouth 3 (three) times daily. 270 tablet 1 04/14/2024  labetalol  (NORMODYNE ) 300 MG tablet Take 1 tablet (300 mg total) by mouth 2 (two) times daily. 180 tablet 3 Taking   LOVAZA 1 g capsule Take 2 g by mouth 2 (two) times daily.   04/14/2024   rosuvastatin  (CRESTOR ) 40 MG tablet Take 40 mg by mouth in the morning.   04/14/2024   Blood Pressure Monitor DEVI 1 Device by Does not apply route daily. 1 each 0    Continuous Blood Gluc Receiver (DEXCOM G7 RECEIVER) DEVI Use to monitor BG continuously 1 each 0    Continuous Glucose Sensor (DEXCOM G7 SENSOR) MISC CHANGE SENSOR EVERY 10 DAYS 3 each 0    Insulin  Pen Needle 32G X 4 MM MISC Use to inject insulin  twice daily 200 each 2    Scheduled:   Chlorhexidine  Gluconate Cloth  6 each Topical Q0600   fenofibrate   54 mg Oral Daily   furosemide   20 mg Oral Daily   insulin  aspart  0-5 Units Subcutaneous QHS   insulin  aspart  0-6 Units Subcutaneous TID WC   insulin  glargine  5 Units Subcutaneous Daily   isosorbide -hydrALAZINE   1 tablet Oral TID    labetalol   300 mg Oral BID    Assessment: 53 yo female with afib to start IV heparin .She is noted on apixaban  PTA and last dose was 9/8 in the morning. Plans noted for possible HD catheter on 9/10  PTT was 37 and heparin  level 0.16. These levels were before holding for [facility] placement. Ok to resume post procedure per Dr. Merrie.    Goal of Therapy:  Heparin  level 0.3-0.7 units/ml Monitor platelets by anticoagulation protocol: Yes   Plan:  -Resume heparin  at 1450 units/hr -Heparin  level in 8 hours and daily wth CBC daily F/u resume apixaban   Sergio Batch, PharmD, BCIDP, AAHIVP, CPP Infectious Disease Pharmacist 04/17/2024 11:53 AM

## 2024-04-17 NOTE — Procedures (Signed)
 RN called down about Tunneled dialysis catheter bleeding at insertion site. IR went to patient's room to assess the catheter site. Catheter had some blood around it that had clotted. Catheter site was cleaned. Hemostasis was observed. New dressing was placed.

## 2024-04-17 NOTE — Progress Notes (Signed)
 PHARMACY - ANTICOAGULATION CONSULT NOTE  Pharmacy Consult for heparin  Indication: atrial fibrillation  Allergies  Allergen Reactions   Bee Pollen Other (See Comments)    Seasonal Allergies   Dust Mite Extract Cough    Sneezing & Cough   Mixed Ragweed Itching    Patient Measurements: Height: 5' 9 (175.3 cm) Weight: 98 kg (216 lb 0.8 oz) IBW/kg (Calculated) : 66.2 HEPARIN  DW (KG): 87.3  Vital Signs: Temp: 97.8 F (36.6 C) (09/10 1955) Temp Source: Oral (09/10 1955) BP: 124/66 (09/10 1955) Pulse Rate: 76 (09/10 1955)  Labs: Recent Labs    04/15/24 1343 04/15/24 1358 04/15/24 1929 04/16/24 0419 04/17/24 0757 04/17/24 2004  HGB 9.9* 10.2*  --  8.6* 8.7*  --   HCT 30.1* 30.0*  --  26.1* 26.2*  --   PLT 289  --   --  258 264  --   APTT  --   --   --   --  37*  --   HEPARINUNFRC  --   --   --   --  0.16* 0.18*  CREATININE 9.40* 10.60* 9.85* 10.01*  --   --     Estimated Creatinine Clearance: 8.3 mL/min (A) (by C-G formula based on SCr of 10.01 mg/dL (H)).   Medical History: Past Medical History:  Diagnosis Date   Anemia    Anxiety    Arthritis    Asthma    Atrial fib/flutter, transient (HCC) 01/2024   pt states that she was dx in [place]   Cataract    Mixed form OU   CKD (chronic kidney disease)    Coronary artery disease    Depression    Diabetes mellitus    Diabetic retinopathy (HCC)    NPDR OU   Dyspnea    Hyperlipidemia    Hypertension    Hypertensive retinopathy    OU   Left thyroid  nodule    diagnosed 07/2018   PAC (premature atrial contraction) 02/15/2021   Pseudotumor cerebri    Sleep apnea    Uses a cpap   Stroke (HCC)    Vitamin D deficiency     Medications:  Medications Prior to Admission  Medication Sig Dispense Refill Last Dose/Taking   albuterol  (PROVENTIL ) (2.5 MG/3ML) 0.083% nebulizer solution Take 2.5 mg by nebulization every 6 (six) hours as needed for wheezing or shortness of breath.   Unknown   albuterol  (VENTOLIN  HFA) 108 (90  Base) MCG/ACT inhaler Inhale 1-2 puffs into the lungs every 6 (six) hours as needed for wheezing or shortness of breath (Asthma).   Unknown   apixaban  (ELIQUIS ) 5 MG TABS tablet Take 1 tablet (5 mg total) by mouth 2 (two) times daily. 180 tablet 3 04/15/2024 at  7:00 AM   diltiazem  (CARDIZEM ) 30 MG tablet Take 1 tablet (30 mg total) by mouth 3 (three) times daily as needed. 90 tablet 1 Unknown   fenofibrate  (TRICOR ) 145 MG tablet Take 145 mg by mouth daily.   04/14/2024   FLONASE ALLERGY RELIEF 50 MCG/ACT nasal spray Place 1 spray into both nostrils daily.   Past Week   furosemide  (LASIX ) 20 MG tablet Take 20 mg by mouth daily.   04/14/2024   insulin  aspart protamine - aspart (NOVOLOG  70/30 MIX) (70-30) 100 UNIT/ML FlexPen Inject 10 Units into the skin 2 (two) times daily with a meal.   04/14/2024   insulin  glargine (LANTUS ) 100 UNIT/ML injection Inject 10 Units into the skin at bedtime.   04/14/2024   isosorbide -hydrALAZINE  (  BIDIL ) 20-37.5 MG tablet Take 1 tablet by mouth 3 (three) times daily. 270 tablet 1 04/14/2024   labetalol  (NORMODYNE ) 300 MG tablet Take 1 tablet (300 mg total) by mouth 2 (two) times daily. 180 tablet 3 Taking   LOVAZA 1 g capsule Take 2 g by mouth 2 (two) times daily.   04/14/2024   rosuvastatin  (CRESTOR ) 40 MG tablet Take 40 mg by mouth in the morning.   04/14/2024   Blood Pressure Monitor DEVI 1 Device by Does not apply route daily. 1 each 0    Continuous Blood Gluc Receiver (DEXCOM G7 RECEIVER) DEVI Use to monitor BG continuously 1 each 0    Continuous Glucose Sensor (DEXCOM G7 SENSOR) MISC CHANGE SENSOR EVERY 10 DAYS 3 each 0    Insulin  Pen Needle 32G X 4 MM MISC Use to inject insulin  twice daily 200 each 2    Scheduled:   Chlorhexidine  Gluconate Cloth  6 each Topical Q0600   fenofibrate   54 mg Oral Daily   furosemide   20 mg Oral Daily   insulin  aspart  0-5 Units Subcutaneous QHS   insulin  aspart  0-6 Units Subcutaneous TID WC   insulin  glargine  5 Units Subcutaneous Daily    isosorbide -hydrALAZINE   1 tablet Oral TID   labetalol   300 mg Oral BID    Assessment: 53 yo female with afib to start IV heparin .She is noted on apixaban  PTA and last dose was 9/8 in the morning. Plans noted for possible HD catheter on 9/10  PTT was 37 and heparin  level 0.16. These levels were before holding for [facility] placement. Ok to resume post procedure per Dr. Merrie.   PM: HL 0.18 is subtherapeutic on heparin  1450 units/hr. No bleeding reported per RN.  Goal of Therapy:  Heparin  level 0.3-0.7 units/ml Monitor platelets by anticoagulation protocol: Yes   Plan:  -Increase heparin  to 1650 units/hr -Heparin  level in 8 hours and daily wth CBC daily - F/u resume apixaban   Rocky Slade, PharmD, BCPS 04/17/2024 8:49 PM  Please check AMION for all [facility] phone numbers After 10:00 PM, call Main Pharmacy [phone]

## 2024-04-17 NOTE — Procedures (Signed)
  Procedure:  R internal jugular tunneled HD CVC Palindrome 19 Preprocedure diagnosis: The primary encounter diagnosis was Malfunction of arteriovenous dialysis fistula, initial encounter (HCC). A diagnosis of ESRD (end stage renal disease) (HCC) was also pertinent to this visit. Postprocedure diagnosis: same EBL:    minimal Complications:   none immediate  See full dictation in [employer].  CHARM Toribio Faes MD Main # [phone] Pager  [phone] Mobile [phone]

## 2024-04-17 NOTE — Consult Note (Signed)
 Chief Complaint: ESRD in need of dialysis access. Request is for [facility]acement  Referring Physician(s): Dr. Manuelita Barters  Supervising Physician: Johann Sieving  Patient Status: [facility] - In-pt  History of Present Illness: [name] is a 53 y.o. female inpatient. Known to IR. History of HTN, HLD, DM, P\VD, a fib (on eliquis ) CVA, ESRD on HD via a RUE  brachiocephalic AV fistula. Created on 8.27.25 and revised on 4.11.25 with branch ligation due to difficulty accessing. and a  On 9.9.25 and former left sided brachial to cephalic AV fistula.  IR performed a fistulagram including a declot and angioplasty. Attempts to dialysis post procedure however were unsuccessful. Team is requesting a tunneled HD catheter placement.   Patient alert and laying in bed,calm. Endorses soreness to AVF site. Denies any fevers, headache, chest pain, SOB, cough, abdominal pain, nausea, vomiting or bleeding.   BUN 80, Cr 10.01, GFR < 4. Heparin  gtt stopped at 09:23. No pertinent allergies. Patient has been NPO since midnight.   Return precautions and treatment recommendations and follow-up discussed with the patient  who is agreeable with the plan.     Past Medical History:  Diagnosis Date   Anemia    Anxiety    Arthritis    Asthma    Atrial fib/flutter, transient (HCC) 01/2024   pt states that she was dx in [place]   Cataract    Mixed form OU   CKD (chronic kidney disease)    Coronary artery disease    Depression    Diabetes mellitus    Diabetic retinopathy (HCC)    NPDR OU   Dyspnea    Hyperlipidemia    Hypertension    Hypertensive retinopathy    OU   Left thyroid  nodule    diagnosed 07/2018   PAC (premature atrial contraction) 02/15/2021   Pseudotumor cerebri    Sleep apnea    Uses a cpap   Stroke [facility])    Vitamin D deficiency     Past Surgical History:  Procedure Laterality Date   A/V FISTULAGRAM N/A 08/16/2023   Procedure: A/V Fistulagram;  Surgeon: Melia Lynwood ORN, MD;   Location: [facility] INVASIVE CV LAB;  Service: Cardiovascular;  Laterality: N/A;   ACHILLES TENDON REPAIR Right    ACHILLES TENDON SURGERY Left 02/19/2021   Procedure: ACHILLES TENDON REPAIR WITH GRAFT;  Surgeon: Tobie Franky SQUIBB, DPM;  Location: [facility];  Service: Podiatry;  Laterality: Left;  BLOCK   AMPUTATION TOE Right 05/28/2021   Procedure: AMPUTATION TOE;  Surgeon: Joya Stabs, MD;  Location: [facility] OR;  Service: Podiatry;  Laterality: Right;  Surgical team will do block   AV FISTULA PLACEMENT Left 07/29/2022   Procedure: LEFT ARTERIOVENOUS (AV) FISTULA CREATION;  Surgeon: Sheree Penne Bruckner, MD;  Location: [facility] OR;  Service: Vascular;  Laterality: Left;   AV FISTULA PLACEMENT Right 04/04/2023   Procedure: RIGHT ARM BRACHIOCEPHALIC ARTERIOVENOUS (AV) FISTULA CREATION;  Surgeon: Lanis Fonda FORBES, MD;  Location: [facility] OR;  Service: Vascular;  Laterality: Right;  With regional block   GASTROC RECESSION EXTREMITY Left 02/19/2021   Procedure: GASTROC RECESSION EXTREMITY;  Surgeon: Tobie Franky SQUIBB, DPM;  Location: [facility];  Service: Podiatry;  Laterality: Left;  Block   INSERTION OF DIALYSIS CATHETER Left 04/20/2023   Procedure: INSERTION OFTUNNELED  DIALYSIS CATHETER;  Surgeon: Lanis Fonda FORBES, MD;  Location: [facility] OR;  Service: Vascular;  Laterality: Left;   IR THROMBECTOMY AV FISTULA W/THROMBOLYSIS/PTA INC/SHUNT/IMG RIGHT Right 04/16/2024  LIGATION OF ARTERIOVENOUS  FISTULA Right 11/17/2023   Procedure: LIGATION OF ARTERIOVENOUS  FISTULA;  Surgeon: Lanis Fonda BRAVO, MD;  Location: [facility] OR;  Service: Vascular;  Laterality: Right;   PERIPHERAL VASCULAR BALLOON ANGIOPLASTY Right 08/16/2023   Procedure: PERIPHERAL VASCULAR BALLOON ANGIOPLASTY;  Surgeon: Melia Lynwood ORN, MD;  Location: [facility];  Service: Cardiovascular;  Laterality: Right;  innominate   REVISON OF ARTERIOVENOUS FISTULA Right 11/17/2023   Procedure: REVISON OF ARTERIOVENOUS FISTULA;  Surgeon: Lanis Fonda BRAVO, MD;  Location: [facility] OR;  Service: Vascular;  Laterality: Right;   TOE SURGERY Left 01/2023   TUBAL LIGATION      Allergies: Bee pollen, Dust mite extract, and Mixed ragweed  Medications: Prior to Admission medications   Medication Sig Start Date End Date Taking? Authorizing Provider  albuterol  (PROVENTIL ) (2.5 MG/3ML) 0.083% nebulizer solution Take 2.5 mg by nebulization every 6 (six) hours as needed for wheezing or shortness of breath. 08/10/18  Yes [provider]  albuterol  (VENTOLIN  HFA) 108 (90 Base) MCG/ACT inhaler Inhale 1-2 puffs into the lungs every 6 (six) hours as needed for wheezing or shortness of breath (Asthma).   Yes [provider]  apixaban  (ELIQUIS ) 5 MG TABS tablet Take 1 tablet (5 mg total) by mouth 2 (two) times daily. 03/26/24  Yes Patwardhan, Manish J, MD  diltiazem  (CARDIZEM ) 30 MG tablet Take 1 tablet (30 mg total) by mouth 3 (three) times daily as needed. 03/26/24  Yes Patwardhan, Manish J, MD  fenofibrate  (TRICOR ) 145 MG tablet Take 145 mg by mouth daily. 08/23/23  Yes [provider]  FLONASE ALLERGY RELIEF 50 MCG/ACT nasal spray Place 1 spray into both nostrils daily.   Yes [provider]  furosemide  (LASIX ) 20 MG tablet Take 20 mg by mouth daily.   Yes [provider]  insulin  aspart protamine - aspart (NOVOLOG  70/30 MIX) (70-30) 100 UNIT/ML FlexPen Inject 10 Units into the skin 2 (two) times daily with a meal. 04/22/23  Yes Fairy Frames, MD  insulin  glargine (LANTUS ) 100 UNIT/ML injection Inject 10 Units into the skin at bedtime.   Yes [provider]  isosorbide -hydrALAZINE  (BIDIL ) 20-37.5 MG tablet Take 1 tablet by mouth 3 (three) times daily. 04/23/22  Yes Gonfa, Taye T, MD  labetalol  (NORMODYNE ) 300 MG tablet Take 1 tablet (300 mg total) by mouth 2 (two) times daily. 03/26/24  Yes Patwardhan, Manish J, MD  LOVAZA 1 g capsule Take 2 g by mouth 2 (two) times daily.   Yes [provider]  rosuvastatin   (CRESTOR ) 40 MG tablet Take 40 mg by mouth in the morning.   Yes [provider]  Blood Pressure Monitor DEVI 1 Device by Does not apply route daily. 07/17/23   Patwardhan, Newman PARAS, MD  Continuous Blood Gluc Receiver (DEXCOM G7 RECEIVER) DEVI Use to monitor BG continuously 04/13/22   Nida, Ethelle ORN, MD  Continuous Glucose Sensor (DEXCOM G7 SENSOR) MISC CHANGE SENSOR EVERY 10 DAYS 12/01/23   Lenis Ethelle ORN, MD  Insulin  Pen Needle 32G X 4 MM MISC Use to inject insulin  twice daily 01/23/23   Lenis Ethelle ORN, MD     Family History  Problem Relation Age of Onset   Asthma Mother    Diabetes Father    Hypertension Father    Kidney disease Father    Heart attack Father    Heart failure Father    Hypertension Sister    Asthma Sister    Congestive Heart Failure Maternal Aunt  Diabetes Maternal Grandmother    Congestive Heart Failure Maternal Grandmother    Lung disease Maternal Grandmother    Asthma Other    Hyperlipidemia Other    Hypertension Other    Cancer Other    BRCA 1/2 Neg Hx    Breast cancer Neg Hx     Social History   Socioeconomic History   Marital status: Single    Spouse name: Not on file   Number of children: 4   Years of education: Not on file   Highest education level: Not on file  Occupational History   Occupation: unemployed  Tobacco Use   Smoking status: Former    Current packs/day: 0.00    Average packs/day: 0.5 packs/day for 30.0 years (15.0 ttl pk-yrs)    Types: Cigarettes    Start date: 67    Quit date: 2017    Years since quitting: 8.6    Passive exposure: Past   Smokeless tobacco: Never  Vaping Use   Vaping status: Never Used  Substance and Sexual Activity   Alcohol use: Not Currently    Alcohol/week: 0.0 standard drinks of alcohol    Comment: rare   Drug use: Not Currently    Types: Marijuana    Comment: occasional   Sexual activity: Not Currently    Partners: Male    Birth control/protection: Surgical  Other  Topics Concern   Not on file  Social History Narrative   Lives with 3 kids   Right handed   Drinks 2 cups caffeine daily   Dialysis [facility] [employer]   Social Drivers of Health   Financial Resource Strain: Not on file  Food Insecurity: Patient Declined (04/17/2024)   Hunger Vital Sign    Worried About Running Out of Food in the Last Year: Patient declined    Ran Out of Food in the Last Year: Patient declined  Transportation Needs: Patient Declined (04/17/2024)   PRAPARE - Administrator, Civil Service (Medical): Patient declined    Lack of Transportation (Non-Medical): Patient declined  Physical Activity: Inactive (02/15/2021)   Exercise Vital Sign    Days of Exercise per Week: 0 days    Minutes of Exercise per Session: 0 min  Stress: Stress Concern Present (02/15/2021)   [employer] of Occupational Health - Occupational Stress Questionnaire    Feeling of Stress : Very much  Social Connections: Moderately Isolated (02/15/2021)   Social Connection and Isolation Panel    Frequency of Communication with Friends and Family: Three times a week    Frequency of Social Gatherings with Friends and Family: Three times a week    Attends Religious Services: More than 4 times per year    Active Member of Clubs or Organizations: No    Attends Banker Meetings: Never    Marital Status: Never married    Review of Systems: A 12 point ROS discussed and pertinent positives are indicated in the HPI above.  All other systems are negative.  Review of Systems  Constitutional:  Negative for fatigue and fever.  HENT:  Negative for congestion.   Respiratory:  Negative for cough and shortness of breath.   Gastrointestinal:  Negative for abdominal pain, diarrhea, nausea and vomiting.  Musculoskeletal:  Positive for arthralgias (left arm soreness).    Vital Signs: BP (!) 140/82 (BP Location: Left Arm)   Pulse 78   Temp 97.8 F (36.6 C) (Oral)   Resp 17   Ht  5' 9 (1.753  m)   Wt 216 lb 0.8 oz (98 kg)   LMP 12/21/2021   SpO2 100%   BMI 31.91 kg/m     Physical Exam Vitals and nursing note reviewed.  Constitutional:      Appearance: She is well-developed. She is obese.  HENT:     Head: Normocephalic and atraumatic.  Eyes:     Conjunctiva/sclera: Conjunctivae normal.  Cardiovascular:     Rate and Rhythm: Normal rate and regular rhythm.  Pulmonary:     Effort: Pulmonary effort is normal.  Musculoskeletal:        General: Normal range of motion.     Cervical back: Normal range of motion.  Skin:    General: Skin is warm and dry.  Neurological:     General: No focal deficit present.     Mental Status: She is alert and oriented to person, place, and time. Mental status is at baseline.  Psychiatric:        Mood and Affect: Mood normal.        Behavior: Behavior normal.        Thought Content: Thought content normal.        Judgment: Judgment normal.     Imaging: IR THROMBECTOMY AV FISTULA/W THROMBOLYSIS/PTA INC SHUNT/IMG RIGHT Result Date: 04/16/2024 CLINICAL DATA:  Occluded dialysis graft COMPARISON:  None Available. EXAM: EXAM DIALYSIS GRAFT DECLOT VENOUS ANGIOPLASTY ULTRASOUND GUIDANCE FOR VASCULAR ACCESS X2 TECHNIQUE: The procedure, risks (including but not limited to bleeding, infection, organ damage ), benefits, and alternatives were explained to the patient. Questions regarding the procedure were encouraged and answered. The patient understands and consents to the procedure. Intravenous Fentanyl  100mcg and Versed  3mg  were administered by RN during a total moderate (conscious) sedation time of 68 minutes; the patient's level of consciousness and physiological / cardiorespiratory status were monitored continuously by radiology RN under my direct supervision. The fistula just central to the arterial anastomosis was accessed antegrade with a 21-gauge micropuncture needle under real-time ultrasonic guidance after the overlying skin prepped  with chlorhexidine , draped in usual sterile fashion, infiltrated locally with 1% lidocaine . Needle exchanged over 018 guidewire for transitional dilator through which 2 mg t-PA was administered. Ultrasound imaging documentation was saved. Through the dilator, a Bentson wire was advanced to the venous anastomosis. Over this a 71F sheath was placed, through which a 5 [place] Kumpe catheter was advanced for outflow venography. This showed patency of the outflow venous system through the SVC. 3000 units heparin  were administered. The AngioJetT ZelanteDVT thrombectomy catheter was used to macerate thrombus in the graft. In similar fashion, the graft more centrally was accessed retrograde under ultrasound with a micropuncture needle, exchanged for a transitional dilator. The dilator was exchanged in similar fashion for a 6 French vascular sheath. The Kumpe catheter was advanced across the arterial anastomosis into the afferent brachial artery. Catheter exchanged for an over the wire Fogarty balloon catheter, used to dislodge the platelet plug into the graft, where it was macerated. The AngioJetT ZelanteDVT thrombectomy catheter was used again to remove any residual platelet plug or clot from the graft central to the arterial anastomosis.Reflux across the arterial anastomosis demonstrate this to be widely patent, with unremarkable appearance of the visualized native arterial circulation. Injection showed partially occlusive thrombus more centrally in the graft limiting outflow. Thrombus from the graft. Balloon angioplasty of the graft and venous outflow was performed using a 7 mm x 4 cm Conquest angioplasty balloon with good response. Balloon was removed and injection showed patency  of the anastomosis, without extravasation or other apparent complication. A follow shuntogram was performed, demonstrating good flow through the outflow vein, no extravasation. The catheter and sheaths were then removed and hemostasis achieved  with 2-0 Ethilon sutures. Patient tolerated procedure well. FLUOROSCOPY: Radiation Exposure Index (as provided by the fluoroscopic device): 43.2 mGy air Kerma COMPLICATIONS: COMPLICATIONS none IMPRESSION: 1. Technically successful declot of right upper arm straight synthetic hemodialysis graft. 2. Technically successful balloon angioplasty of venous anastomotic stenosis. ACCESS: Remove pursestring sutures at next hemodialysis session after today. Electronically Signed   By: JONETTA Faes M.D.   On: 04/16/2024 15:46    Labs:  CBC: Recent Labs    10/16/23 1838 11/17/23 0557 04/15/24 1343 04/15/24 1358 04/16/24 0419 04/17/24 0757  WBC 6.7  --  7.7  --  7.8 7.7  HGB 8.6*   < > 9.9* 10.2* 8.6* 8.7*  HCT 26.1*   < > 30.1* 30.0* 26.1* 26.2*  PLT 220  --  289  --  258 264   < > = values in this interval not displayed.    COAGS: Recent Labs    04/17/24 0757  APTT 37*    BMP: Recent Labs    04/22/23 0250 10/16/23 1838 11/17/23 0557 04/15/24 1343 04/15/24 1358 04/15/24 1929 04/16/24 0419  NA 135 137 136 139 137  --  137  K 3.8 3.4* 3.5 3.9 3.9  --  3.3*  CL 96* 99 94* 100 102  --  98  CO2 27 23  --  22  --   --  21*  GLUCOSE 153* 179* 225* 191* 187*  --  258*  BUN 52* 50* 37* 71* 75*  --  80*  CALCIUM  8.5* 8.5*  --  8.5*  --   --  8.1*  CREATININE 4.86* 6.16* 7.10* 9.40* 10.60* 9.85* 10.01*  GFRNONAA 10* 8*  --  5*  --  4* 4*    LIVER FUNCTION TESTS: Recent Labs    04/19/23 0401 04/15/24 1343  BILITOT  --  0.5  AST  --  15  ALT  --  12  ALKPHOS  --  37*  PROT  --  7.3  ALBUMIN 3.3* 3.4*    TUMOR MARKERS: No results for input(s): AFPTM, CEA, CA199, CHROMGRNA in the last 8760 hours.  Assessment and Plan:  53 y.o. female inpatient. Known to IR. History of HTN, HLD, DM, P\VD, a fib (on eliquis ) CVA, ESRD on HD via a RUE  brachiocephalic AV fistula. Created on 8.27.25 and revised on 4.11.25 with branch ligation due to difficulty accessing. and a  On 9.9.25 and  former left sided brachial to cephalic AV fistula.  IR performed a fistulagram including a declot and angioplasty. Attempts to dialysis post procedure however were unsuccessful. Team is requesting a tunneled HD catheter placement.   PLAN: IR Image Guided Tunneled HD Catheter Placement  Risks and benefits discussed with the patient including, but not limited to bleeding, infection, vascular injury, pneumothorax which may require chest tube placement, air embolism or even death  All of the patient's questions were answered, patient is agreeable to proceed. Consent signed and in chart.   Thank you for this interesting consult.  I greatly enjoyed meeting [name] and look forward to participating in their care.  A copy of this report was sent to the requesting provider on this date.  Electronically Signed: Delon JAYSON Beagle, NP 04/17/2024, 9:13 AM   I spent a total of 20 Minutes  in face to face in clinical consultation, greater than 50% of which was counseling/coordinating care for [facility] Placement

## 2024-04-18 ENCOUNTER — Encounter (HOSPITAL_COMMUNITY): Admission: RE | Payer: Self-pay | Source: Home / Self Care

## 2024-04-18 ENCOUNTER — Ambulatory Visit (HOSPITAL_COMMUNITY): Admission: RE | Admit: 2024-04-18 | Source: Home / Self Care | Admitting: Vascular Surgery

## 2024-04-18 DIAGNOSIS — T82898A Other specified complication of vascular prosthetic devices, implants and grafts, initial encounter: Secondary | ICD-10-CM | POA: Diagnosis not present

## 2024-04-18 LAB — GLUCOSE, CAPILLARY
Glucose-Capillary: 159 mg/dL — ABNORMAL HIGH (ref 70–99)
Glucose-Capillary: 216 mg/dL — ABNORMAL HIGH (ref 70–99)
Glucose-Capillary: 122 mg/dL — ABNORMAL HIGH (ref 70–99)

## 2024-04-18 LAB — RENAL FUNCTION PANEL
Albumin: 3 g/dL — ABNORMAL LOW (ref 3.5–5.0)
Anion gap: 14 (ref 5–15)
BUN: 29 mg/dL — ABNORMAL HIGH (ref 6–20)
CO2: 24 mmol/L (ref 22–32)
Calcium: 8.3 mg/dL — ABNORMAL LOW (ref 8.9–10.3)
Chloride: 94 mmol/L — ABNORMAL LOW (ref 98–111)
Creatinine, Ser: 5.11 mg/dL — ABNORMAL HIGH (ref 0.44–1.00)
GFR, Estimated: 10 mL/min — ABNORMAL LOW (ref >60)
Glucose, Bld: 215 mg/dL — ABNORMAL HIGH (ref 70–99)
Phosphorus: 3.1 mg/dL (ref 2.5–4.6)
Potassium: 3.1 mmol/L — ABNORMAL LOW (ref 3.5–5.1)
Sodium: 132 mmol/L — ABNORMAL LOW (ref 135–145)

## 2024-04-18 LAB — CBC
HCT: 27 % — ABNORMAL LOW (ref 36.0–46.0)
Hemoglobin: 9.1 g/dL — ABNORMAL LOW (ref 12.0–15.0)
MCH: 33.2 pg (ref 26.0–34.0)
MCHC: 33.7 g/dL (ref 30.0–36.0)
MCV: 98.5 fL (ref 80.0–100.0)
Platelets: 227 K/uL (ref 150–400)
RBC: 2.74 MIL/uL — ABNORMAL LOW (ref 3.87–5.11)
RDW: 18.3 % — ABNORMAL HIGH (ref 11.5–15.5)
WBC: 6 K/uL (ref 4.0–10.5)
nRBC: 0 % (ref 0.0–0.2)

## 2024-04-18 LAB — HEPARIN LEVEL (UNFRACTIONATED): Heparin Unfractionated: 0.39 [IU]/mL (ref 0.30–0.70)

## 2024-04-18 SURGERY — A/V SHUNT INTERVENTION
Anesthesia: LOCAL | Site: Arm Upper | Laterality: Right

## 2024-04-18 MED ORDER — HEPARIN SODIUM (PORCINE) 1000 UNIT/ML IJ SOLN
4000.0000 [IU] | Freq: Once | INTRAMUSCULAR | Status: AC
  Administered 2024-04-18: 4000 [IU]

## 2024-04-18 MED ORDER — APIXABAN 5 MG PO TABS
5.0000 mg | ORAL_TABLET | Freq: Two times a day (BID) | ORAL | Status: DC
  Administered 2024-04-18: 5 mg via ORAL
  Filled 2024-04-18: qty 1

## 2024-04-18 MED ORDER — HEPARIN SODIUM (PORCINE) 1000 UNIT/ML IJ SOLN
INTRAMUSCULAR | Status: AC
  Filled 2024-04-18: qty 3

## 2024-04-18 MED ORDER — POTASSIUM CHLORIDE CRYS ER 20 MEQ PO TBCR
40.0000 meq | EXTENDED_RELEASE_TABLET | Freq: Once | ORAL | Status: AC
  Administered 2024-04-18: 40 meq via ORAL
  Filled 2024-04-18: qty 2

## 2024-04-18 NOTE — Discharge Summary (Signed)
 Physician Discharge Summary   Patient: [name] MRN: [id number] DOB: February 14, 1971  Admit date:     04/15/2024  Discharge date: 04/18/24  Discharge Physician: Lebron JINNY Cage   PCP: Catalina Bare, MD   Recommendations at discharge:   Follow-up with PCP in 1 week Follow-up with vascular surgery as scheduled due to clotted fistula Follow-up with nephrology as scheduled  Discharge Diagnoses: Principal Problem:   Problem with dialysis access [facility]) Active Problems:   Uncontrolled type 2 diabetes mellitus with hyperglycemia, with long-term current use of insulin  (HCC)   Essential hypertension, benign   Chronic heart failure with preserved ejection fraction (HCC)   Anemia of chronic disease   Cerebrovascular accident (CVA) (HCC)   Mixed hyperlipidemia   Obstructive sleep apnea   Hypertensive urgency   Clotted dialysis access [facility])    Hospital Course: [name] is a 53 y.o. year old female with PMH of hypertension, hyperlipidemia, obesity, DM, PVD Asthma, atrial fibrillation on Eliquis  , multiple strokes ESRD ON hd, who has been having dialysis access issues sent to the emergency room from dialysis as they could not access the HD fistula. Nephrology consulted.  Patient admitted for further management.  On 9/9, right upper extremity graft declot was attempted but unsuccessful by IR.  On 9/10, TDC was placed by IR.    Today, patient denied any new complaints.  Had HD late last night.  Stable for discharge home.  Advised to follow-up with vascular surgery due to malfunctioning fistula   Assessment and Plan:  Noted dialysis access ESRD on HD mwf On 9/9, right upper extremity graft declot was attempted but unsuccessful by IR On 9/10, TDC was placed by IR Discussed with vascular surgery on 9/10, outpatient follow-up for further management   Hypokalemia Replaced as needed   Uncontrolled type 2 diabetes mellitus with hyperglycemia, with long-term current use of  insulin  A1c 7.9 on 04/2024 Continue home regimen   Essential hypertension, benign Chronic heart failure with preserved ejection fraction (HCC) HtN Urgency BP now stable  Continue home labetalol , Lasix  and BiDil    Anemia of chronic disease Stable hb.monitor   CVA Hx: Mixed hyperlipidemia Continue Eliquis    Obstructive sleep apnea Resume CPAP   Obesity class I Lifestyle modification advised         Consultants: Nephrology, IR Procedures performed: [facility] placed Disposition: Home Diet recommendation:  Renal diet   DISCHARGE MEDICATION: Allergies as of 04/18/2024       Reactions   Bee Pollen Other (See Comments)   Seasonal Allergies   Dust Mite Extract Cough   Sneezing & Cough   Mixed Ragweed Itching        Medication List     TAKE these medications    albuterol  108 (90 Base) MCG/ACT inhaler Commonly known as: VENTOLIN  HFA Inhale 1-2 puffs into the lungs every 6 (six) hours as needed for wheezing or shortness of breath (Asthma).   albuterol  (2.5 MG/3ML) 0.083% nebulizer solution Commonly known as: PROVENTIL  Take 2.5 mg by nebulization every 6 (six) hours as needed for wheezing or shortness of breath.   Blood Pressure Monitor Devi 1 Device by Does not apply route daily.   Dexcom G7 Receiver Devi Use to monitor BG continuously   Dexcom G7 Sensor Misc CHANGE SENSOR EVERY 10 DAYS   diltiazem  30 MG tablet Commonly known as: Cardizem  Take 1 tablet (30 mg total) by mouth 3 (three) times daily as needed.   Eliquis  5 MG Tabs tablet Generic drug: apixaban  Take  1 tablet (5 mg total) by mouth 2 (two) times daily.   fenofibrate  145 MG tablet Commonly known as: TRICOR  Take 145 mg by mouth daily.   Flonase Allergy Relief 50 MCG/ACT nasal spray Generic drug: fluticasone Place 1 spray into both nostrils daily.   furosemide  20 MG tablet Commonly known as: LASIX  Take 20 mg by mouth daily.   insulin  aspart protamine - aspart (70-30) 100 UNIT/ML  FlexPen Commonly known as: NOVOLOG  70/30 MIX Inject 10 Units into the skin 2 (two) times daily with a meal.   insulin  glargine 100 UNIT/ML injection Commonly known as: LANTUS  Inject 10 Units into the skin at bedtime.   Insulin  Pen Needle 32G X 4 MM Misc Use to inject insulin  twice daily   isosorbide -hydrALAZINE  20-37.5 MG tablet Commonly known as: BiDil  Take 1 tablet by mouth 3 (three) times daily.   labetalol  300 MG tablet Commonly known as: NORMODYNE  Take 1 tablet (300 mg total) by mouth 2 (two) times daily.   Lovaza 1 g capsule Generic drug: omega-3 acid ethyl esters Take 2 g by mouth 2 (two) times daily.   rosuvastatin  40 MG tablet Commonly known as: CRESTOR  Take 40 mg by mouth in the morning.        Follow-up Information     Osei-Bonsu, Zachary, MD. Schedule an appointment as soon as possible for a visit in 1 week(s).   Specialty: Internal Medicine Contact information: [address] [place] [address] [phone]         Lanis Fonda BRAVO, MD Follow up.   Specialty: Vascular Surgery Why: follow up Contact information: [address] [place] [address] [phone]                Discharge Exam: Fredricka Weights   04/15/24 1342  Weight: 98 kg   General: NAD  Cardiovascular: S1, S2 present Respiratory: CTAB Abdomen: Soft, nontender, nondistended, bowel sounds present Musculoskeletal: No bilateral pedal edema noted Skin: Normal Psychiatry: Normal mood   Condition at discharge: stable  The results of significant diagnostics from this hospitalization (including imaging, microbiology, ancillary and laboratory) are listed below for reference.   Imaging Studies: IR TUNNELED CENTRAL VENOUS CATH [facility] W IMG Result Date: 04/17/2024 CLINICAL DATA:  End-stage renal disease on hemodialysis, inability to use right upper arm shunt post declot, additional access requested EXAM: TUNNELED HEMODIALYSIS CATHETER PLACEMENT WITH  ULTRASOUND AND FLUOROSCOPIC GUIDANCE TECHNIQUE: The procedure, risks, benefits, and alternatives were explained to the patient. Questions regarding the procedure were encouraged and answered. The patient understands and consents to the procedure. As antibiotic prophylaxis, cefazolin  2 g was ordered pre-procedure and administered intravenously within one hour of incision.Patency of the right IJ vein was confirmed with ultrasound with image documentation. An appropriate skin site was determined. Region was prepped using maximum barrier technique including cap and mask, sterile gown, sterile gloves, large sterile sheet, and Chlorhexidine  as cutaneous antisepsis. The region was infiltrated locally with 1% lidocaine . Intravenous Fentanyl  50mcg and Versed  1.5mg  were administered by RN during a total moderate (conscious) sedation time of 12 minutes; the patient's level of consciousness and physiological / cardiorespiratory status were monitored continuously by radiology RN under my direct supervision. Under real-time ultrasound guidance, the right IJ vein was accessed with a 21 gauge micropuncture needle; the needle tip within the vein was confirmed with ultrasound image documentation. Needle exchanged over the 018 guidewire for transitional dilator, which allowed advancement of a Benson wire into the IVC. Over this, an MPA catheter was  advanced. A Palindrome 19 hemodialysis catheter was tunneled from the right anterior chest wall approach to the right IJ dermatotomy site. The MPA catheter was exchanged over an Amplatz wire for serial vascular dilators which allow placement of a peel-away sheath, through which the catheter was advanced under intermittent fluoroscopy, positioned with its tips in the proximal and midright atrium. Spot chest radiograph confirms good catheter position. No pneumothorax. Catheter was flushed and primed per protocol. Catheter secured externally with O Prolene sutures. The right IJ dermatotomy  site was closed with Dermabond. COMPLICATIONS: COMPLICATIONS None immediate FLUOROSCOPY: Radiation Exposure Index (as provided by the fluoroscopic device): 2.4 mGy air Kerma COMPARISON:  None Available. IMPRESSION: 1. Technically successful placement of tunneled right IJ hemodialysis catheter with ultrasound and fluoroscopic guidance. Ready for routine use. ACCESS: Remains approachable for percutaneous intervention as needed. Electronically Signed   By: JONETTA Faes M.D.   On: 04/17/2024 16:57   IR PATIENT EVAL TECH 0-60 MINS Result Date: 04/17/2024 Herold Ozell HERO, RT     04/17/2024  2:59 PM RN called down about Tunneled dialysis catheter bleeding at insertion site. IR went to patient's room to assess the catheter site. Catheter had some blood around it that had clotted. Catheter site was cleaned. Hemostasis was observed. New dressing was placed.  IR US  Guide Vasc Access Right Result Date: 04/17/2024 CLINICAL DATA:  Occluded dialysis graft COMPARISON:  None Available. EXAM: EXAM DIALYSIS GRAFT DECLOT VENOUS ANGIOPLASTY ULTRASOUND GUIDANCE FOR VASCULAR ACCESS X2 TECHNIQUE: The procedure, risks (including but not limited to bleeding, infection, organ damage ), benefits, and alternatives were explained to the patient. Questions regarding the procedure were encouraged and answered. The patient understands and consents to the procedure. Intravenous Fentanyl  100mcg and Versed  3mg  were administered by RN during a total moderate (conscious) sedation time of 68 minutes; the patient's level of consciousness and physiological / cardiorespiratory status were monitored continuously by radiology RN under my direct supervision. The fistula just central to the arterial anastomosis was accessed antegrade with a 21-gauge micropuncture needle under real-time ultrasonic guidance after the overlying skin prepped with chlorhexidine , draped in usual sterile fashion, infiltrated locally with 1% lidocaine . Needle exchanged over 018  guidewire for transitional dilator through which 2 mg t-PA was administered. Ultrasound imaging documentation was saved. Through the dilator, a Bentson wire was advanced to the venous anastomosis. Over this a 17F sheath was placed, through which a 5 [place] Kumpe catheter was advanced for outflow venography. This showed patency of the outflow venous system through the SVC. 3000 units heparin  were administered. The AngioJetT ZelanteDVT thrombectomy catheter was used to macerate thrombus in the graft. In similar fashion, the graft more centrally was accessed retrograde under ultrasound with a micropuncture needle, exchanged for a transitional dilator. The dilator was exchanged in similar fashion for a 6 French vascular sheath. The Kumpe catheter was advanced across the arterial anastomosis into the afferent brachial artery. Catheter exchanged for an over the wire Fogarty balloon catheter, used to dislodge the platelet plug into the graft, where it was macerated. The AngioJetT ZelanteDVT thrombectomy catheter was used again to remove any residual platelet plug or clot from the graft central to the arterial anastomosis.Reflux across the arterial anastomosis demonstrate this to be widely patent, with unremarkable appearance of the visualized native arterial circulation. Injection showed partially occlusive thrombus more centrally in the graft limiting outflow. Thrombus from the graft. Balloon angioplasty of the graft and venous outflow was performed using a 7 mm x 4 cm Conquest angioplasty balloon  with good response. Balloon was removed and injection showed patency of the anastomosis, without extravasation or other apparent complication. A follow shuntogram was performed, demonstrating good flow through the outflow vein, no extravasation. The catheter and sheaths were then removed and hemostasis achieved with 2-0 Ethilon sutures. Patient tolerated procedure well. FLUOROSCOPY: Radiation Exposure Index (as provided by the  fluoroscopic device): 43.2 mGy air Kerma COMPLICATIONS: COMPLICATIONS none IMPRESSION: 1. Technically successful declot of right upper arm straight synthetic hemodialysis graft. 2. Technically successful balloon angioplasty of venous anastomotic stenosis. ACCESS: Remove pursestring sutures at next hemodialysis session after today. Electronically Signed   By: JONETTA Faes M.D.   On: 04/17/2024 10:05   IR THROMBECTOMY AV FISTULA/W THROMBOLYSIS/PTA INC SHUNT/IMG RIGHT Result Date: 04/16/2024 CLINICAL DATA:  Occluded dialysis graft COMPARISON:  None Available. EXAM: EXAM DIALYSIS GRAFT DECLOT VENOUS ANGIOPLASTY ULTRASOUND GUIDANCE FOR VASCULAR ACCESS X2 TECHNIQUE: The procedure, risks (including but not limited to bleeding, infection, organ damage ), benefits, and alternatives were explained to the patient. Questions regarding the procedure were encouraged and answered. The patient understands and consents to the procedure. Intravenous Fentanyl  100mcg and Versed  3mg  were administered by RN during a total moderate (conscious) sedation time of 68 minutes; the patient's level of consciousness and physiological / cardiorespiratory status were monitored continuously by radiology RN under my direct supervision. The fistula just central to the arterial anastomosis was accessed antegrade with a 21-gauge micropuncture needle under real-time ultrasonic guidance after the overlying skin prepped with chlorhexidine , draped in usual sterile fashion, infiltrated locally with 1% lidocaine . Needle exchanged over 018 guidewire for transitional dilator through which 2 mg t-PA was administered. Ultrasound imaging documentation was saved. Through the dilator, a Bentson wire was advanced to the venous anastomosis. Over this a 47F sheath was placed, through which a 5 [place] Kumpe catheter was advanced for outflow venography. This showed patency of the outflow venous system through the SVC. 3000 units heparin  were administered. The AngioJetT  ZelanteDVT thrombectomy catheter was used to macerate thrombus in the graft. In similar fashion, the graft more centrally was accessed retrograde under ultrasound with a micropuncture needle, exchanged for a transitional dilator. The dilator was exchanged in similar fashion for a 6 French vascular sheath. The Kumpe catheter was advanced across the arterial anastomosis into the afferent brachial artery. Catheter exchanged for an over the wire Fogarty balloon catheter, used to dislodge the platelet plug into the graft, where it was macerated. The AngioJetT ZelanteDVT thrombectomy catheter was used again to remove any residual platelet plug or clot from the graft central to the arterial anastomosis.Reflux across the arterial anastomosis demonstrate this to be widely patent, with unremarkable appearance of the visualized native arterial circulation. Injection showed partially occlusive thrombus more centrally in the graft limiting outflow. Thrombus from the graft. Balloon angioplasty of the graft and venous outflow was performed using a 7 mm x 4 cm Conquest angioplasty balloon with good response. Balloon was removed and injection showed patency of the anastomosis, without extravasation or other apparent complication. A follow shuntogram was performed, demonstrating good flow through the outflow vein, no extravasation. The catheter and sheaths were then removed and hemostasis achieved with 2-0 Ethilon sutures. Patient tolerated procedure well. FLUOROSCOPY: Radiation Exposure Index (as provided by the fluoroscopic device): 43.2 mGy air Kerma COMPLICATIONS: COMPLICATIONS none IMPRESSION: 1. Technically successful declot of right upper arm straight synthetic hemodialysis graft. 2. Technically successful balloon angioplasty of venous anastomotic stenosis. ACCESS: Remove pursestring sutures at next hemodialysis session after today. Electronically Signed  By: JONETTA Faes M.D.   On: 04/16/2024 15:46    Microbiology: Results  for orders placed or performed during the hospital encounter of 04/12/23  Resp panel by RT-PCR (RSV, Flu A&B, Covid) Anterior Nasal Swab     Status: None   Collection Time: 04/12/23  2:35 PM   Specimen: Anterior Nasal Swab  Result Value Ref Range Status   SARS Coronavirus 2 by RT PCR NEGATIVE NEGATIVE Final   Influenza A by PCR NEGATIVE NEGATIVE Final   Influenza B by PCR NEGATIVE NEGATIVE Final    Comment: (NOTE) The Xpert Xpress SARS-CoV-2/FLU/RSV plus assay is intended as an aid in the diagnosis of influenza from Nasopharyngeal swab specimens and should not be used as a sole basis for treatment. Nasal washings and aspirates are unacceptable for Xpert Xpress SARS-CoV-2/FLU/RSV testing.  Fact Sheet for Patients: BloggerCourse.com  Fact Sheet for Healthcare Providers: SeriousBroker.it  This test is not yet approved or cleared by the [place]  FDA and has been authorized for detection and/or diagnosis of SARS-CoV-2 by FDA under an Emergency Use Authorization (EUA). This EUA will remain in effect (meaning this test can be used) for the duration of the COVID-19 declaration under Section 564(b)(1) of the Act, 21 U.S.C. section 360bbb-3(b)(1), unless the authorization is terminated or revoked.     Resp Syncytial Virus by PCR NEGATIVE NEGATIVE Final    Comment: (NOTE) Fact Sheet for Patients: BloggerCourse.com  Fact Sheet for Healthcare Providers: SeriousBroker.it  This test is not yet approved or cleared by the [place]  FDA and has been authorized for detection and/or diagnosis of SARS-CoV-2 by FDA under an Emergency Use Authorization (EUA). This EUA will remain in effect (meaning this test can be used) for the duration of the COVID-19 declaration under Section 564(b)(1) of the Act, 21 U.S.C. section 360bbb-3(b)(1), unless the authorization is terminated  or revoked.  Performed at [facility], [address], [place], [place] [address]     Labs: CBC: Recent Labs  Lab 04/15/24 1343 04/15/24 1358 04/16/24 0419 04/17/24 0757 04/18/24 0342  WBC 7.7  --  7.8 7.7 6.0  HGB 9.9* 10.2* 8.6* 8.7* 9.1*  HCT 30.1* 30.0* 26.1* 26.2* 27.0*  MCV 102.4*  --  99.6 100.4* 98.5  PLT 289  --  258 264 227   Basic Metabolic Panel: Recent Labs  Lab 04/15/24 1343 04/15/24 1358 04/15/24 1929 04/16/24 0419 04/18/24 0342  NA 139 137  --  137 132*  K 3.9 3.9  --  3.3* 3.1*  CL 100 102  --  98 94*  CO2 22  --   --  21* 24  GLUCOSE 191* 187*  --  258* 215*  BUN 71* 75*  --  80* 29*  CREATININE 9.40* 10.60* 9.85* 10.01* 5.11*  CALCIUM  8.5*  --   --  8.1* 8.3*  PHOS  --   --   --   --  3.1   Liver Function Tests: Recent Labs  Lab 04/15/24 1343 04/18/24 0342  AST 15  --   ALT 12  --   ALKPHOS 37*  --   BILITOT 0.5  --   PROT 7.3  --   ALBUMIN 3.4* 3.0*   CBG: Recent Labs  Lab 04/17/24 1701 04/17/24 2029 04/18/24 0633 04/18/24 0909 04/18/24 1210  GLUCAP 249* 127* 159* 216* 122*    Discharge time spent: less than 30 minutes.  Signed: Lebron JINNY Cage, MD [facility] 04/18/2024

## 2024-04-18 NOTE — Progress Notes (Signed)
 D/C order noted. Contacted [facility] to be advised of pt's d/c today and that pt should resume care tomorrow.   Randine Mungo Dialysis Navigator [phone]

## 2024-04-18 NOTE — Discharge Planning (Signed)
 [facility] Patient Discharge Orders- [facility] CLINIC: [facility]  [name] name: CANDELA KRUL Admit/DC Dates: 04/15/2024 - 04/18/2024  Discharge Diagnoses: Clotted AVG- New TDC placed in IR. See below for details and plan    Aranesp : Given: No     Last Hgb: 9.1 PRBC's Given: No ESA dose for discharge: Resume mircera 200 mcg IV q 2 weeks  IV Iron  dose at discharge: Resume weekly Venofer  Heparin  change: No  EDW Change: No  Bath Change: No. K+ 3.1 at dc but supplementation was given.   Access intervention/Change: Yes Details: AVG malfunctioned. De-clot was unsuccessful so new TDC was placed in IR 9/10. A shuntogram is no longer indicated since the de-clot was unsuccessful. She needs to be referred to [facility] for new perm access placement. Place referral in once she returns back to [facility].  Hectorol/Calcitriol  change: N/A  Discharge Labs: Calcium  8.3  Phosphorus 3.1  Albumin 3.0  K+ 3.1  IV Antibiotics: No  On Coumadin?: No. No Eliquis    OTHER/APPTS/LAB ORDERS:  Re-check K+ level tomorrow (04/19/24) pre-HD Refer to [facility] for new perm access. AVG clotted and de-clot here was unsuccessful. See above for details    D/C Meds to be reconciled by nurse after every discharge.  Completed By: Charmaine Piety, NP   Reviewed by: MD:______ RN_______

## 2024-04-18 NOTE — Progress Notes (Signed)
 [facility] Progress Note   Subjective:    Seen and examined patient at bedside. Received HD late last night after new TDC was placed (unsuccessful de-clot of L AVG on 9/9). 2L was removed. She wore CPAP overnight which is her baseline. She feels well. Okay for discharge today from a renal standpoint. I updated the primary team.  Objective Vitals:   04/18/24 0030 04/18/24 0120 04/18/24 0219 04/18/24 0912  BP: (!) 147/62 (!) 147/65 135/73 (!) 181/86  Pulse: 76 78 79 82  Resp:   13   Temp:      TempSrc:      SpO2: 100% 100% 98%   Weight:      Height:       Physical Exam General: Awake, alert, on RA, NAD Lungs: CTA bilaterally. No wheeze, rales or rhonchi. Breathing is unlabored. Heart: RRR. No murmur, rubs or gallops.  Abdomen: soft and non-tender Lower extremities: no LE edema Neuro: AAOx3. Moves all extremities spontaneously. Dialysis Access: [facility] now in place; unsuccessful de-clot L AVG  Filed Weights   04/15/24 1342  Weight: 98 kg    Intake/Output Summary (Last 24 hours) at 04/18/2024 1048 Last data filed at 04/18/2024 0120 Gross per 24 hour  Intake 297.72 ml  Output 2000 ml  Net -1702.28 ml    Additional Objective Labs: Basic Metabolic Panel: Recent Labs  Lab 04/15/24 1343 04/15/24 1358 04/15/24 1929 04/16/24 0419 04/18/24 0342  NA 139 137  --  137 132*  K 3.9 3.9  --  3.3* 3.1*  CL 100 102  --  98 94*  CO2 22  --   --  21* 24  GLUCOSE 191* 187*  --  258* 215*  BUN 71* 75*  --  80* 29*  CREATININE 9.40* 10.60* 9.85* 10.01* 5.11*  CALCIUM  8.5*  --   --  8.1* 8.3*  PHOS  --   --   --   --  3.1   Liver Function Tests: Recent Labs  Lab 04/15/24 1343 04/18/24 0342  AST 15  --   ALT 12  --   ALKPHOS 37*  --   BILITOT 0.5  --   PROT 7.3  --   ALBUMIN 3.4* 3.0*   No results for input(s): LIPASE, AMYLASE in the last 168 hours. CBC: Recent Labs  Lab 04/15/24 1343 04/15/24 1358 04/16/24 0419 04/17/24 0757 04/18/24 0342  WBC  7.7  --  7.8 7.7 6.0  HGB 9.9*   < > 8.6* 8.7* 9.1*  HCT 30.1*   < > 26.1* 26.2* 27.0*  MCV 102.4*  --  99.6 100.4* 98.5  PLT 289  --  258 264 227   < > = values in this interval not displayed.   Blood Culture    Component Value Date/Time   SDES WOUND RIGHT TOE 05/28/2021 1600   SPECREQUEST NONE 05/28/2021 1600   CULT  05/28/2021 1600    RARE NORMAL SKIN FLORA NO GROUP A STREP (S.PYOGENES) ISOLATED NO STAPHYLOCOCCUS AUREUS ISOLATED NO ANAEROBES ISOLATED Performed at [facility], [address], [place], [place] [address]    REPTSTATUS 06/02/2021 FINAL 05/28/2021 1600    Cardiac Enzymes: No results for input(s): CKTOTAL, CKMB, CKMBINDEX, TROPONINI in the last 168 hours. CBG: Recent Labs  Lab 04/17/24 1152 04/17/24 1701 04/17/24 2029 04/18/24 0633 04/18/24 0909  GLUCAP 171* 249* 127* 159* 216*   Iron  Studies: No results for input(s): IRON , TIBC, TRANSFERRIN, FERRITIN in the last 72 hours. Lab Results  Component Value Date   INR 1.0 02/11/2019   Studies/Results: IR TUNNELED CENTRAL VENOUS CATH PLC W IMG Result Date: 04/17/2024 CLINICAL DATA:  End-stage renal disease on hemodialysis, inability to use right upper arm shunt post declot, additional access requested EXAM: TUNNELED HEMODIALYSIS CATHETER PLACEMENT WITH ULTRASOUND AND FLUOROSCOPIC GUIDANCE TECHNIQUE: The procedure, risks, benefits, and alternatives were explained to the patient. Questions regarding the procedure were encouraged and answered. The patient understands and consents to the procedure. As antibiotic prophylaxis, cefazolin  2 g was ordered pre-procedure and administered intravenously within one hour of incision.Patency of the right IJ vein was confirmed with ultrasound with image documentation. An appropriate skin site was determined. Region was prepped using maximum barrier technique including cap and mask, sterile gown, sterile gloves, large sterile sheet, and Chlorhexidine  as cutaneous  antisepsis. The region was infiltrated locally with 1% lidocaine . Intravenous Fentanyl  50mcg and Versed  1.5mg  were administered by RN during a total moderate (conscious) sedation time of 12 minutes; the patient's level of consciousness and physiological / cardiorespiratory status were monitored continuously by radiology RN under my direct supervision. Under real-time ultrasound guidance, the right IJ vein was accessed with a 21 gauge micropuncture needle; the needle tip within the vein was confirmed with ultrasound image documentation. Needle exchanged over the 018 guidewire for transitional dilator, which allowed advancement of a Benson wire into the IVC. Over this, an MPA catheter was advanced. A Palindrome 19 hemodialysis catheter was tunneled from the right anterior chest wall approach to the right IJ dermatotomy site. The MPA catheter was exchanged over an Amplatz wire for serial vascular dilators which allow placement of a peel-away sheath, through which the catheter was advanced under intermittent fluoroscopy, positioned with its tips in the proximal and midright atrium. Spot chest radiograph confirms good catheter position. No pneumothorax. Catheter was flushed and primed per protocol. Catheter secured externally with O Prolene sutures. The right IJ dermatotomy site was closed with Dermabond. COMPLICATIONS: COMPLICATIONS None immediate FLUOROSCOPY: Radiation Exposure Index (as provided by the fluoroscopic device): 2.4 mGy air Kerma COMPARISON:  None Available. IMPRESSION: 1. Technically successful placement of tunneled right IJ hemodialysis catheter with ultrasound and fluoroscopic guidance. Ready for routine use. ACCESS: Remains approachable for percutaneous intervention as needed. Electronically Signed   By: JONETTA Faes M.D.   On: 04/17/2024 16:57   IR PATIENT EVAL TECH 0-60 MINS Result Date: 04/17/2024 Herold Ozell HERO, RT     04/17/2024  2:59 PM RN called down about Tunneled dialysis catheter bleeding  at insertion site. IR went to patient's room to assess the catheter site. Catheter had some blood around it that had clotted. Catheter site was cleaned. Hemostasis was observed. New dressing was placed.  IR US  Guide Vasc Access Right Result Date: 04/17/2024 CLINICAL DATA:  Occluded dialysis graft COMPARISON:  None Available. EXAM: EXAM DIALYSIS GRAFT DECLOT VENOUS ANGIOPLASTY ULTRASOUND GUIDANCE FOR VASCULAR ACCESS X2 TECHNIQUE: The procedure, risks (including but not limited to bleeding, infection, organ damage ), benefits, and alternatives were explained to the patient. Questions regarding the procedure were encouraged and answered. The patient understands and consents to the procedure. Intravenous Fentanyl  100mcg and Versed  3mg  were administered by RN during a total moderate (conscious) sedation time of 68 minutes; the patient's level of consciousness and physiological / cardiorespiratory status were monitored continuously by radiology RN under my direct supervision. The fistula just central to the arterial anastomosis was accessed antegrade with a 21-gauge micropuncture needle under real-time ultrasonic guidance after the overlying skin prepped with chlorhexidine , draped  in usual sterile fashion, infiltrated locally with 1% lidocaine . Needle exchanged over 018 guidewire for transitional dilator through which 2 mg t-PA was administered. Ultrasound imaging documentation was saved. Through the dilator, a Bentson wire was advanced to the venous anastomosis. Over this a 45F sheath was placed, through which a 5 [place] Kumpe catheter was advanced for outflow venography. This showed patency of the outflow venous system through the SVC. 3000 units heparin  were administered. The AngioJetT ZelanteDVT thrombectomy catheter was used to macerate thrombus in the graft. In similar fashion, the graft more centrally was accessed retrograde under ultrasound with a micropuncture needle, exchanged for a transitional dilator. The  dilator was exchanged in similar fashion for a 6 French vascular sheath. The Kumpe catheter was advanced across the arterial anastomosis into the afferent brachial artery. Catheter exchanged for an over the wire Fogarty balloon catheter, used to dislodge the platelet plug into the graft, where it was macerated. The AngioJetT ZelanteDVT thrombectomy catheter was used again to remove any residual platelet plug or clot from the graft central to the arterial anastomosis.Reflux across the arterial anastomosis demonstrate this to be widely patent, with unremarkable appearance of the visualized native arterial circulation. Injection showed partially occlusive thrombus more centrally in the graft limiting outflow. Thrombus from the graft. Balloon angioplasty of the graft and venous outflow was performed using a 7 mm x 4 cm Conquest angioplasty balloon with good response. Balloon was removed and injection showed patency of the anastomosis, without extravasation or other apparent complication. A follow shuntogram was performed, demonstrating good flow through the outflow vein, no extravasation. The catheter and sheaths were then removed and hemostasis achieved with 2-0 Ethilon sutures. Patient tolerated procedure well. FLUOROSCOPY: Radiation Exposure Index (as provided by the fluoroscopic device): 43.2 mGy air Kerma COMPLICATIONS: COMPLICATIONS none IMPRESSION: 1. Technically successful declot of right upper arm straight synthetic hemodialysis graft. 2. Technically successful balloon angioplasty of venous anastomotic stenosis. ACCESS: Remove pursestring sutures at next hemodialysis session after today. Electronically Signed   By: JONETTA Faes M.D.   On: 04/17/2024 10:05   IR THROMBECTOMY AV FISTULA/W THROMBOLYSIS/PTA INC SHUNT/IMG RIGHT Result Date: 04/16/2024 CLINICAL DATA:  Occluded dialysis graft COMPARISON:  None Available. EXAM: EXAM DIALYSIS GRAFT DECLOT VENOUS ANGIOPLASTY ULTRASOUND GUIDANCE FOR VASCULAR ACCESS X2  TECHNIQUE: The procedure, risks (including but not limited to bleeding, infection, organ damage ), benefits, and alternatives were explained to the patient. Questions regarding the procedure were encouraged and answered. The patient understands and consents to the procedure. Intravenous Fentanyl  100mcg and Versed  3mg  were administered by RN during a total moderate (conscious) sedation time of 68 minutes; the patient's level of consciousness and physiological / cardiorespiratory status were monitored continuously by radiology RN under my direct supervision. The fistula just central to the arterial anastomosis was accessed antegrade with a 21-gauge micropuncture needle under real-time ultrasonic guidance after the overlying skin prepped with chlorhexidine , draped in usual sterile fashion, infiltrated locally with 1% lidocaine . Needle exchanged over 018 guidewire for transitional dilator through which 2 mg t-PA was administered. Ultrasound imaging documentation was saved. Through the dilator, a Bentson wire was advanced to the venous anastomosis. Over this a 45F sheath was placed, through which a 5 [place] Kumpe catheter was advanced for outflow venography. This showed patency of the outflow venous system through the SVC. 3000 units heparin  were administered. The AngioJetT ZelanteDVT thrombectomy catheter was used to macerate thrombus in the graft. In similar fashion, the graft more centrally was accessed retrograde under ultrasound with a  micropuncture needle, exchanged for a transitional dilator. The dilator was exchanged in similar fashion for a 6 French vascular sheath. The Kumpe catheter was advanced across the arterial anastomosis into the afferent brachial artery. Catheter exchanged for an over the wire Fogarty balloon catheter, used to dislodge the platelet plug into the graft, where it was macerated. The AngioJetT ZelanteDVT thrombectomy catheter was used again to remove any residual platelet plug or clot from  the graft central to the arterial anastomosis.Reflux across the arterial anastomosis demonstrate this to be widely patent, with unremarkable appearance of the visualized native arterial circulation. Injection showed partially occlusive thrombus more centrally in the graft limiting outflow. Thrombus from the graft. Balloon angioplasty of the graft and venous outflow was performed using a 7 mm x 4 cm Conquest angioplasty balloon with good response. Balloon was removed and injection showed patency of the anastomosis, without extravasation or other apparent complication. A follow shuntogram was performed, demonstrating good flow through the outflow vein, no extravasation. The catheter and sheaths were then removed and hemostasis achieved with 2-0 Ethilon sutures. Patient tolerated procedure well. FLUOROSCOPY: Radiation Exposure Index (as provided by the fluoroscopic device): 43.2 mGy air Kerma COMPLICATIONS: COMPLICATIONS none IMPRESSION: 1. Technically successful declot of right upper arm straight synthetic hemodialysis graft. 2. Technically successful balloon angioplasty of venous anastomotic stenosis. ACCESS: Remove pursestring sutures at next hemodialysis session after today. Electronically Signed   By: JONETTA Faes M.D.   On: 04/16/2024 15:46    Medications:   apixaban   5 mg Oral BID   Chlorhexidine  Gluconate Cloth  6 each Topical Q0600   fenofibrate   54 mg Oral Daily   furosemide   20 mg Oral Daily   insulin  aspart  0-5 Units Subcutaneous QHS   insulin  aspart  0-6 Units Subcutaneous TID WC   insulin  glargine  5 Units Subcutaneous Daily   isosorbide -hydrALAZINE   1 tablet Oral TID   labetalol   300 mg Oral BID    Dialysis Orders: [facility]-MWF BFR 400, DFR Auto 1.5 EDW 98.3kg 4hrs 2K/2,5Ca Heparin  bolus 3000 units with HD  Assessment/Plan: AVG malfunction - Unsuccessful de-clot of L AVG 9/9. S/p new TDC placement in IR yesterday.  Vascular Access - She will need new perm access  2nd unsuccessful de-clot. Primary has already reached out to [facility] who plans to f/u with patient in outpatient. Scheduled shuntogram is no longer indicated at this time. ESRD - on HD MWF. Next HD 9/12 in outpatient Hypertension/volume  - Euvolemic and Bps are stable Anemia of CKD - Hgb 8.7. ESA not due yet. Last dose given 9/1 in outpatient Secondary Hyperparathyroidism -  Resume binders in outpatient Dispo - Okay for discharge today from a renal standpoint.  Charmaine Piety, NP [facility] 04/18/2024,10:48 AM  LOS: 0 days

## 2024-04-18 NOTE — Progress Notes (Signed)
   04/18/24 0251  BiPAP/CPAP/SIPAP  $ Non-Invasive Ventilator  Non-Invasive Vent Subsequent  BiPAP/CPAP/SIPAP Pt Type Adult  BiPAP/CPAP/SIPAP DREAMSTATIOND  Mask Type Full face mask  Mask Size Medium  EPAP 8 cmH2O  Patient Home Machine No  Patient Home Mask No  Patient Home Tubing No  Auto Titrate No  Device Plugged into RED Power Outlet Yes  BiPAP/CPAP /SiPAP Vitals  Bilateral Breath Sounds Diminished

## 2024-04-18 NOTE — Progress Notes (Signed)
 PHARMACY - ANTICOAGULATION CONSULT NOTE  Pharmacy Consult for heparin >>apixaban  Indication: atrial fibrillation  Allergies  Allergen Reactions   Bee Pollen Other (See Comments)    Seasonal Allergies   Dust Mite Extract Cough    Sneezing & Cough   Mixed Ragweed Itching    Patient Measurements: Height: 5' 9 (175.3 cm) Weight: 98 kg (216 lb 0.8 oz) IBW/kg (Calculated) : 66.2 HEPARIN  DW (KG): 87.3  Vital Signs: Temp: 98.4 F (36.9 C) (09/10 2117) Temp Source: Oral (09/10 2117) BP: 135/73 (09/11 0219) Pulse Rate: 79 (09/11 0219)  Labs: Recent Labs    04/15/24 1929 04/16/24 0419 04/17/24 0757 04/17/24 2004 04/18/24 0342 04/18/24 0642  HGB  --  8.6* 8.7*  --  9.1*  --   HCT  --  26.1* 26.2*  --  27.0*  --   PLT  --  258 264  --  227  --   APTT  --   --  37*  --   --   --   HEPARINUNFRC  --   --  0.16* 0.18*  --  0.39  CREATININE 9.85* 10.01*  --   --  5.11*  --     Estimated Creatinine Clearance: 16.2 mL/min (A) (by C-G formula based on SCr of 5.11 mg/dL (H)).   Medical History: Past Medical History:  Diagnosis Date   Anemia    Anxiety    Arthritis    Asthma    Atrial fib/flutter, transient (HCC) 01/2024   pt states that she was dx in [place]   Cataract    Mixed form OU   CKD (chronic kidney disease)    Coronary artery disease    Depression    Diabetes mellitus    Diabetic retinopathy (HCC)    NPDR OU   Dyspnea    Hyperlipidemia    Hypertension    Hypertensive retinopathy    OU   Left thyroid  nodule    diagnosed 07/2018   PAC (premature atrial contraction) 02/15/2021   Pseudotumor cerebri    Sleep apnea    Uses a cpap   Stroke (HCC)    Vitamin D deficiency     Medications:  Medications Prior to Admission  Medication Sig Dispense Refill Last Dose/Taking   albuterol  (PROVENTIL ) (2.5 MG/3ML) 0.083% nebulizer solution Take 2.5 mg by nebulization every 6 (six) hours as needed for wheezing or shortness of breath.   Unknown   albuterol  (VENTOLIN  HFA)  108 (90 Base) MCG/ACT inhaler Inhale 1-2 puffs into the lungs every 6 (six) hours as needed for wheezing or shortness of breath (Asthma).   Unknown   apixaban  (ELIQUIS ) 5 MG TABS tablet Take 1 tablet (5 mg total) by mouth 2 (two) times daily. 180 tablet 3 04/15/2024 at  7:00 AM   diltiazem  (CARDIZEM ) 30 MG tablet Take 1 tablet (30 mg total) by mouth 3 (three) times daily as needed. 90 tablet 1 Unknown   fenofibrate  (TRICOR ) 145 MG tablet Take 145 mg by mouth daily.   04/14/2024   FLONASE ALLERGY RELIEF 50 MCG/ACT nasal spray Place 1 spray into both nostrils daily.   Past Week   furosemide  (LASIX ) 20 MG tablet Take 20 mg by mouth daily.   04/14/2024   insulin  aspart protamine - aspart (NOVOLOG  70/30 MIX) (70-30) 100 UNIT/ML FlexPen Inject 10 Units into the skin 2 (two) times daily with a meal.   04/14/2024   insulin  glargine (LANTUS ) 100 UNIT/ML injection Inject 10 Units into the skin at bedtime.  04/14/2024   isosorbide -hydrALAZINE  (BIDIL ) 20-37.5 MG tablet Take 1 tablet by mouth 3 (three) times daily. 270 tablet 1 04/14/2024   labetalol  (NORMODYNE ) 300 MG tablet Take 1 tablet (300 mg total) by mouth 2 (two) times daily. 180 tablet 3 Taking   LOVAZA 1 g capsule Take 2 g by mouth 2 (two) times daily.   04/14/2024   rosuvastatin  (CRESTOR ) 40 MG tablet Take 40 mg by mouth in the morning.   04/14/2024   Blood Pressure Monitor DEVI 1 Device by Does not apply route daily. 1 each 0    Continuous Blood Gluc Receiver (DEXCOM G7 RECEIVER) DEVI Use to monitor BG continuously 1 each 0    Continuous Glucose Sensor (DEXCOM G7 SENSOR) MISC CHANGE SENSOR EVERY 10 DAYS 3 each 0    Insulin  Pen Needle 32G X 4 MM MISC Use to inject insulin  twice daily 200 each 2    Scheduled:   apixaban   5 mg Oral BID   Chlorhexidine  Gluconate Cloth  6 each Topical Q0600   fenofibrate   54 mg Oral Daily   furosemide   20 mg Oral Daily   insulin  aspart  0-5 Units Subcutaneous QHS   insulin  aspart  0-6 Units Subcutaneous TID WC   insulin  glargine   5 Units Subcutaneous Daily   isosorbide -hydrALAZINE   1 tablet Oral TID   labetalol   300 mg Oral BID   potassium chloride   40 mEq Oral Once    Assessment: 53 yo female with afib to start IV heparin .She is noted on apixaban  PTA and last dose was 9/8 in the morning. Plans noted for possible HD catheter on 9/10  PTT was 37 and heparin  level 0.16. These levels were before holding for [facility] placement. Ok to resume post procedure per Dr. Merrie.   Ok to transition back to PO apixaban  today.   Goal of Therapy:  Monitor platelets by anticoagulation protocol: Yes   Plan:  Dc heparin  Apixaban  5mg  PO BID  Sergio Batch, PharmD, [place], AAHIVP, CPP Infectious Disease Pharmacist 04/18/2024 8:17 AM

## 2024-04-18 NOTE — Inpatient Diabetes Management (Signed)
 Inpatient Diabetes Program Recommendations  AACE/ADA: New Consensus Statement on Inpatient Glycemic Control (2015)  Target Ranges:  Prepandial:   less than 140 mg/dL      Peak postprandial:   less than 180 mg/dL (1-2 hours)      Critically ill patients:  140 - 180 mg/dL   Lab Results  Component Value Date   GLUCAP 122 (H) 04/18/2024   HGBA1C 7.9 (H) 04/15/2024    Review of Glycemic Control  Latest Reference Range & Units 04/17/24 06:42 04/17/24 11:52 04/17/24 17:01 04/17/24 20:29 04/18/24 06:33 04/18/24 09:09 04/18/24 12:10  Glucose-Capillary 70 - 99 mg/dL 849 (H) 828 (H) 750 (H) 127 (H) 159 (H) 216 (H) 122 (H)   Diabetes history: DM 2 Outpatient Diabetes medications:  Dexcom G7-  Novolog  70/30 10 units bid Lantus  10 units q HS Current orders for Inpatient glycemic control:  Novolog  0-6 units tid with meals and HS Lantus  5 units daily Inpatient Diabetes Program Recommendations:    Please consider increasing Lantus  to 8 units daily.   Thanks,  Randall Bullocks, RN, BC-ADM Inpatient Diabetes Coordinator Pager [phone]  (8a-5p)

## 2024-04-19 NOTE — TOC Transition Note (Signed)
 Transition of Care - Initial Contact after Hospitalization  Date of discharge: 04/18/2024  Date of contact: 04/19/24  Method: Phone Spoke to: Patient  Patient contacted to discuss transition of care from recent inpatient hospitalization. Patient was admitted to [facility] from 04/15/24 to 04/18/24 discharge diagnosis of clotted AVG. De-clot was unsuccessful. New TDC placed. Plan to refer to [facility] for new perm access placement.  The discharge medication list was reviewed. Patient understands the changes and has no concerns.   Patient is on HD now at [facility]. Next HD 9/15.  Charmaine Piety, NP

## 2024-04-22 ENCOUNTER — Other Ambulatory Visit: Payer: Self-pay

## 2024-04-22 DIAGNOSIS — N186 End stage renal disease: Secondary | ICD-10-CM

## 2024-04-24 ENCOUNTER — Telehealth: Payer: Self-pay

## 2024-04-24 NOTE — Telephone Encounter (Addendum)
 Patient called asking about wound care s/p attempted declot by IR.  Returned call to pt, no answer, left message ([phone]).  Patient called back @ 1115.   Pt knows to get her dialysis center to assess the incisions and remove stitches.

## 2024-04-24 NOTE — Progress Notes (Addendum)
   04/24/2024  Patient ID: [name], female   DOB: 1971/04/16, 53 y.o.   MRN: [id number]  Contacted patient regarding referral for diabetes from [place], [place].   The patient was having difficulty with her Dexcom sensor. Her PCP requested that I assist her with transitioning to [place] CGM. I provided the patient with a Libre 3 Plus sensor, and aided with application, as well as set up on the [place] app and with LibreView.   The patient should also qualify for Medicare due to having ESRD, will assist patient with the enrollment process at upcoming visit.    Appointment scheduled for 04/30/24 at 11:00am.   Heather Factor, PharmD Clinical Pharmacist  [phone]

## 2024-04-25 ENCOUNTER — Encounter: Payer: Self-pay | Admitting: Dietician

## 2024-04-25 ENCOUNTER — Encounter: Attending: Physician Assistant | Admitting: Dietician

## 2024-04-25 VITALS — Ht 69.0 in | Wt 224.0 lb

## 2024-04-25 DIAGNOSIS — E118 Type 2 diabetes mellitus with unspecified complications: Secondary | ICD-10-CM | POA: Insufficient documentation

## 2024-04-25 NOTE — Progress Notes (Signed)
 Diabetes Self-Management Education  Visit Type: First/Initial  Appt. Start Time: 1600 Appt. End Time: 1700  04/25/2024  [name], identified by name and date of birth, is a 53 y.o. female with a diagnosis of Diabetes: Type 2.   ASSESSMENT Patient is here today alone.  She was last seen by this RD 02/07/2022  Patient states that she needs to have another access point for HD.   She is here due to a referral from her MD.  Referral:  Type 2 Diabetes  History includes:  Type 2 Diabetes, ESRD on HD (2024), hypothyroidism, CVA, OSA - on c-pap, depression/anxiety, anemia, HLD, vitamin D deficiency Medications includes:  lasix , Lovaza, rosuvastatin , Lantus  q HS, Novolog  70/30 before each meals 2-3 times daily Labs:  A1C 7.9% 04/15/2024, BUN 29, Creatinine 5.11, Potassium 3.1, phos 3.1, eGFR 10 on 04/18/2024, vitamin B-12 >2000 on 07/31/2019 Lipid Panel     Component Value Date/Time   CHOL 194 06/29/2022 0917   TRIG 480 (H) 06/29/2022 0917   HDL 42 06/29/2022 0917   CHOLHDL 4.6 (H) 06/29/2022 0917   CHOLHDL 4.9 02/12/2019 0537   VLDL 77 (H) 02/12/2019 0537   LDLCALC 76 06/29/2022 0917   LABVLDL 76 (H) 06/29/2022 0917  CGM:  Libre - sensor reading 146 now  CGM Results from download: 04/25/2024  % Time CGM active:    %   (Goal >70%)  Average glucose:   197 mg/dL for 7 days  Glucose management indicator:   N/a %  Time in range (70-180 mg/dL):   37 %   (Goal >29%)  Time High (181-250 mg/dL):   45 %   (Goal < 74%)  Time Very High (>250 mg/dL):    18 %   (Goal < 5%)  Time Low (54-69 mg/dL):   0 %   (Goal <5%)  Time Very Low (<54 mg/dL):   0 %   (Goal <8%)  %CV (glucose variability)     %  (Goal <36%)   65 224 lbs 04/25/2024 222-226 lbs dry weight 243 lbs 02/07/2022  Estimated needs:   2200 calories per day 70-80 grams protein daily  Patient lives with her 2 teen children.  They share shopping and cooking. She is on disability. Fluid restriction:  5 cups day Doesn't like  many vegetables (only green beans, corn, and peas) Avoids red meat but eats plant based.  Sometimes eats regular chicken. Height 5' 9 (1.753 m), weight 224 lb (101.6 kg), last menstrual period 12/21/2021. Body mass index is 33.08 kg/m.   Diabetes Self-Management Education - 04/25/24 1612       Visit Information   Visit Type First/Initial      Initial Visit   Diabetes Type Type 2    Are you currently following a meal plan? No      Health Coping   How would you rate your overall health? Fair      Psychosocial Assessment   Patient Belief/Attitude about Diabetes Other (comment)   frustrated   What is the hardest part about your diabetes right now, causing you the most concern, or is the most worrisome to you about your diabetes?   Making healty food and beverage choices;Being active    Self-care barriers Debilitated state due to current medical condition    Self-management support Doctor's office    Other persons present Patient    Patient Concerns Nutrition/Meal planning;Glycemic Control    Special Needs None    Preferred Learning Style No preference indicated  Learning Readiness Ready    How often do you need to have someone help you when you read instructions, pamphlets, or other written materials from your doctor or pharmacy? 1 - Never    What is the last grade level you completed in school? some college      Pre-Education Assessment   Patient understands the diabetes disease and treatment process. Needs Review    Patient understands incorporating nutritional management into lifestyle. Needs Review    Patient undertands incorporating physical activity into lifestyle. Needs Review    Patient understands using medications safely. Needs Review    Patient understands monitoring blood glucose, interpreting and using results Needs Review    Patient understands prevention, detection, and treatment of acute complications. Needs Review    Patient understands prevention, detection,  and treatment of chronic complications. Needs Review    Patient understands how to develop strategies to address psychosocial issues. Needs Review    Patient understands how to develop strategies to promote health/change behavior. Needs Review      Complications   Last HgB A1C per patient/outside source 7.9 %   04/15/2024   How often do you check your blood sugar? > 4 times/day    Fasting Blood glucose range (mg/dL) 869-820    Postprandial Blood glucose range (mg/dL) 869-820;819-799;>799    Number of hypoglycemic episodes per month 0    Number of hyperglycemic episodes ( >200mg /dL): Daily    Can you tell when your blood sugar is high? Yes    Have you had a dilated eye exam in the past 12 months? Yes    Have you had a dental exam in the past 12 months? Yes    Are you checking your feet? Yes    How many days per week are you checking your feet? 7      Dietary Intake   Breakfast smoothie (pineapple, papaya, almond milk)    Snack (morning) none    Lunch leftovers OR tuna with crackers or bread    Snack (afternoon) none    Dinner cheese (mozzerela)  steak sandwich (homemade)    Snack (evening) regular popsicle or sugar free pudding    Beverage(s) water, ice, unsweetened tea (flavor pack), homemade smoothie      Activity / Exercise   Activity / Exercise Type Light (walking / raking leaves)    How many days per week do you exercise? 2    How many minutes per day do you exercise? 15    Total minutes per week of exercise 30      Patient Education   Previous Diabetes Education Yes   2023   Healthy Eating Meal options for control of blood glucose level and chronic complications.;Reviewed blood glucose goals for pre and post meals and how to evaluate the patients' food intake on their blood glucose level.    Being Active Role of exercise on diabetes management, blood pressure control and cardiac health.    Medications Reviewed patients medication for diabetes, action, purpose, timing of dose  and side effects.    Monitoring Taught/evaluated CGM (comment);Identified appropriate SMBG and/or A1C goals.    Chronic complications Relationship between chronic complications and blood glucose control;Assessed and discussed foot care and prevention of foot problems    Diabetes Stress and Support Identified and addressed patients feelings and concerns about diabetes;Worked with patient to identify barriers to care and solutions;Role of stress on diabetes      Individualized Goals (developed by patient)   Nutrition General guidelines  for healthy choices and portions discussed    Physical Activity Exercise 5-7 days per week;15 minutes per day    Medications take my medication as prescribed    Problem Solving Medication consistency;Eating Pattern    Reducing Risk examine blood glucose patterns;do foot checks daily;treat hypoglycemia with 15 grams of carbs if blood glucose less than 70mg /dL      Post-Education Assessment   Patient understands the diabetes disease and treatment process. Comprehends key points    Patient understands incorporating nutritional management into lifestyle. Needs Review    Patient undertands incorporating physical activity into lifestyle. Comprehends key points    Patient understands using medications safely. Comphrehends key points    Patient understands monitoring blood glucose, interpreting and using results Comprehends key points    Patient understands prevention, detection, and treatment of acute complications. Comprehends key points    Patient understands prevention, detection, and treatment of chronic complications. Comprehends key points    Patient understands how to develop strategies to address psychosocial issues. Needs Review    Patient understands how to develop strategies to promote health/change behavior. Needs Review      Outcomes   Expected Outcomes Demonstrated interest in learning but significant barriers to change    Future DMSE 3-4 months     Program Status Not Completed          Individualized Plan for Diabetes Self-Management Training:   Learning Objective:  Patient will have a greater understanding of diabetes self-management. Patient education plan is to attend individual and/or group sessions per assessed needs and concerns.   Plan:   Patient Instructions  Renal diabetes diet - meal plan provided  Low sodium  Low potassium  Adequate protein Avoid foods with added Phos... in the ingredient list  Consider walking for 15 minutes after each meal to help lower your blood glucose  Expected Outcomes:  Demonstrated interest in learning but significant barriers to change  Education material provided: ADA - How to Thrive: A Guide for Your Journey with Diabetes, pages from Choose a meal for HD with meal plan  If problems or questions, patient to contact team via:  Phone  Future DSME appointment: 3-4 months

## 2024-04-25 NOTE — Patient Instructions (Signed)
 Renal diabetes diet - meal plan provided  Low sodium  Low potassium  Adequate protein Avoid foods with added Phos... in the ingredient list  Consider walking for 15 minutes after each meal to help lower your blood glucose

## 2024-04-30 ENCOUNTER — Ambulatory Visit (HOSPITAL_COMMUNITY)
Admission: RE | Admit: 2024-04-30 | Discharge: 2024-04-30 | Disposition: A | Discharge status: Home / Self Care | Source: Ambulatory Visit | Attending: Internal Medicine | Admitting: Internal Medicine

## 2024-04-30 DIAGNOSIS — I480 Paroxysmal atrial fibrillation: Secondary | ICD-10-CM | POA: Diagnosis present

## 2024-04-30 LAB — ECHOCARDIOGRAM COMPLETE
Area-P 1/2: 5.09 cm2
P 1/2 time: 371 ms
S' Lateral: 2.2 cm

## 2024-04-30 NOTE — Progress Notes (Signed)
   04/30/2024  Patient ID: [name], female   DOB: October 08, 1970, 53 y.o.   MRN: [id number]  Contacted patient regarding referral for diabetes from [place], [place].   Appointment scheduled for 04/30/24 at 11:00am.   Attempted to contact patient for scheduled appointment for medication management. Left HIPAA compliant message for patient to return my call at their convenience.   Heather Factor, PharmD Clinical Pharmacist  [phone]

## 2024-05-02 ENCOUNTER — Encounter (HOSPITAL_COMMUNITY)
Admission: RE | Disposition: A | Discharge status: Home / Self Care | Payer: Self-pay | Source: Home / Self Care | Attending: Vascular Surgery

## 2024-05-02 ENCOUNTER — Encounter (HOSPITAL_COMMUNITY): Payer: Self-pay | Admitting: Vascular Surgery

## 2024-05-02 ENCOUNTER — Ambulatory Visit (HOSPITAL_COMMUNITY)
Admission: RE | Admit: 2024-05-02 | Discharge: 2024-05-02 | Disposition: A | Discharge status: Home / Self Care | Attending: Vascular Surgery | Admitting: Vascular Surgery

## 2024-05-02 ENCOUNTER — Other Ambulatory Visit: Payer: Self-pay

## 2024-05-02 DIAGNOSIS — Z794 Long term (current) use of insulin: Secondary | ICD-10-CM | POA: Insufficient documentation

## 2024-05-02 DIAGNOSIS — I120 Hypertensive chronic kidney disease with stage 5 chronic kidney disease or end stage renal disease: Secondary | ICD-10-CM | POA: Insufficient documentation

## 2024-05-02 DIAGNOSIS — T8241XA Breakdown (mechanical) of vascular dialysis catheter, initial encounter: Secondary | ICD-10-CM | POA: Diagnosis not present

## 2024-05-02 DIAGNOSIS — N186 End stage renal disease: Secondary | ICD-10-CM | POA: Diagnosis present

## 2024-05-02 DIAGNOSIS — E1122 Type 2 diabetes mellitus with diabetic chronic kidney disease: Secondary | ICD-10-CM | POA: Diagnosis not present

## 2024-05-02 DIAGNOSIS — Z560 Unemployment, unspecified: Secondary | ICD-10-CM | POA: Insufficient documentation

## 2024-05-02 DIAGNOSIS — T8249XA Other complication of vascular dialysis catheter, initial encounter: Secondary | ICD-10-CM | POA: Diagnosis not present

## 2024-05-02 DIAGNOSIS — Z992 Dependence on renal dialysis: Secondary | ICD-10-CM

## 2024-05-02 DIAGNOSIS — Z87891 Personal history of nicotine dependence: Secondary | ICD-10-CM | POA: Insufficient documentation

## 2024-05-02 HISTORY — PX: TUNNELLED CATHETER EXCHANGE: CATH118373

## 2024-05-02 SURGERY — TUNNELLED CATHETER EXCHANGE
Anesthesia: LOCAL | Site: Chest | Laterality: Right

## 2024-05-02 SURGERY — TUNNELLED CATHETER EXCHANGE
Anesthesia: LOCAL | Site: Chest

## 2024-05-02 MED ORDER — MIDAZOLAM HCL 2 MG/2ML IJ SOLN
INTRAMUSCULAR | Status: DC | PRN
  Administered 2024-05-02: 1 mg via INTRAVENOUS

## 2024-05-02 MED ORDER — MIDAZOLAM HCL 2 MG/2ML IJ SOLN
INTRAMUSCULAR | Status: AC
  Filled 2024-05-02: qty 2

## 2024-05-02 MED ORDER — HEPARIN (PORCINE) IN NACL 1000-0.9 UT/500ML-% IV SOLN
INTRAVENOUS | Status: DC | PRN
  Administered 2024-05-02: 500 mL

## 2024-05-02 MED ORDER — LIDOCAINE HCL (PF) 1 % IJ SOLN
INTRAMUSCULAR | Status: DC | PRN
  Administered 2024-05-02: 15 mL

## 2024-05-02 MED ORDER — HEPARIN SODIUM (PORCINE) 1000 UNIT/ML IJ SOLN
INTRAMUSCULAR | Status: AC
  Filled 2024-05-02: qty 10

## 2024-05-02 MED ORDER — HEPARIN SODIUM (PORCINE) 1000 UNIT/ML IJ SOLN
INTRAMUSCULAR | Status: DC | PRN
  Administered 2024-05-02 (×2): 1900 [IU] via INTRAVENOUS

## 2024-05-02 MED ORDER — LIDOCAINE HCL (PF) 1 % IJ SOLN
INTRAMUSCULAR | Status: AC
  Filled 2024-05-02: qty 30

## 2024-05-02 MED ORDER — FENTANYL CITRATE (PF) 100 MCG/2ML IJ SOLN
INTRAMUSCULAR | Status: DC | PRN
  Administered 2024-05-02: 50 ug via INTRAVENOUS

## 2024-05-02 MED ORDER — FENTANYL CITRATE (PF) 100 MCG/2ML IJ SOLN
INTRAMUSCULAR | Status: AC
  Filled 2024-05-02: qty 2

## 2024-05-02 SURGICAL SUPPLY — 3 items
CATH PALINDROME-P 23 W/VT (CATHETERS) IMPLANT
TRAY PV CATH (CUSTOM PROCEDURE TRAY) ×2 IMPLANT
WIRE BENTSON .035X145CM (WIRE) IMPLANT

## 2024-05-02 NOTE — Op Note (Signed)
    Patient name: [name] MRN: [id number] DOB: 1971-02-16 Sex: female  05/02/2024 Pre-operative Diagnosis: End-stage renal disease, malfunctioning tunneled dialysis catheter Post-operative diagnosis:  Same Surgeon:  Penne BROCKS. Sheree, MD Procedure Performed:  1.  Right IJ tunneled dialysis catheter exchange to 23 cm catheter via new access site  2.  Moderate sedation with fentanyl  and Versed  for 19 minutes  Indications: 53 year old female with end-stage renal disease currently dialyzing via right IJ catheter.  She has had positional difficulty as well as pain at the neck insertion site and she is now indicated for exchange.  Findings: The existing catheter was 19 cm terminating at the SVC atrial junction.  It appeared to have somewhat of an acute angle at the neck.  We placed an new catheter via counterincision and a completion 23 cm catheter terminated at the SVC atrial junction with a smoother transition into the IJ.  I did flush and withdrawal heparinized saline easily.   Procedure:  The patient was identified in the holding area and taken to heart and vascular procedure room.  The patient was then placed supine on the table and prepped and draped in the usual sterile fashion.  A time out was called.  Moderate sedation with fentanyl  and Versed  was administered her vital signs were monitored throughout the case.  Fluoroscopy was used to identify the catheter which appeared to have somewhat of an acute angle at the IJ insertion site.  We began by anesthetizing the skin and subcutaneous tissue around the existing catheter and at the new expected tunneling site.  I then reopened the neck incision at the IJ insertion site and dissected down to the catheter.  I then transected the catheter at the skin entrance site and was able to remove the catheter up to the IJ insertion site at the cuff and then placed a Bentson wire under fluoroscopic guidance into the IVC and introducer sheath was placed under  fluoroscopic guidance.  A counterincision was then created more cephalad towards the clavicle and a 23 cm catheter was then tunneled and placed into the introducer sheath to the SVC atrial junction.  This was noted to have smooth transition to the IJ insertion site.  It did flush and withdraw heparinized saline easily.  The catheter was affixed to the skin with nylon suture and the neck incision was closed with 4-0 Monocryl and Dermabond and a sterile dressing was placed at the sites.  The catheter was locked with 1.9 cc of concentrated heparin  in both ports and locked.  She tolerated the procedure without immediate complication.    Harlene Petralia C. Sheree, MD [facility] Office: [phone] Pager: [phone]

## 2024-05-02 NOTE — H&P (Signed)
 H+P   History of Present Illness: This is a 53 y.o. female history of end-stage renal disease on dialysis via right IJ tunneled dialysis catheter placed on the ninth of this month.  She had fistula right upper extremity which is now occluded.  Catheter is positional and gives her pain in the right neck.  She is indicated for exchange.  Past Medical History:  Diagnosis Date   Anemia    Anxiety    Arthritis    Asthma    Atrial fib/flutter, transient (HCC) 01/2024   pt states that she was dx in [place]   Cataract    Mixed form OU   CKD (chronic kidney disease)    Coronary artery disease    Depression    Diabetes mellitus    Diabetic retinopathy (HCC)    NPDR OU   Dyspnea    Hyperlipidemia    Hypertension    Hypertensive retinopathy    OU   Left thyroid  nodule    diagnosed 07/2018   PAC (premature atrial contraction) 02/15/2021   Pseudotumor cerebri    Sleep apnea    Uses a cpap   Stroke [facility])    Vitamin D deficiency     Past Surgical History:  Procedure Laterality Date   A/V FISTULAGRAM N/A 08/16/2023   Procedure: A/V Fistulagram;  Surgeon: Melia Lynwood ORN, MD;  Location: [facility];  Service: Cardiovascular;  Laterality: N/A;   ACHILLES TENDON REPAIR Right    ACHILLES TENDON SURGERY Left 02/19/2021   Procedure: ACHILLES TENDON REPAIR WITH GRAFT;  Surgeon: Tobie Franky SQUIBB, DPM;  Location: [facility];  Service: Podiatry;  Laterality: Left;  BLOCK   AMPUTATION TOE Right 05/28/2021   Procedure: AMPUTATION TOE;  Surgeon: Joya Stabs, MD;  Location: [facility] OR;  Service: Podiatry;  Laterality: Right;  Surgical team will do block   AV FISTULA PLACEMENT Left 07/29/2022   Procedure: LEFT ARTERIOVENOUS (AV) FISTULA CREATION;  Surgeon: Sheree Penne Bruckner, MD;  Location: [facility] OR;  Service: Vascular;  Laterality: Left;   AV FISTULA PLACEMENT Right 04/04/2023   Procedure: RIGHT ARM BRACHIOCEPHALIC ARTERIOVENOUS (AV) FISTULA CREATION;  Surgeon: Lanis Fonda BRAVO, MD;   Location: [facility] OR;  Service: Vascular;  Laterality: Right;  With regional block   GASTROC RECESSION EXTREMITY Left 02/19/2021   Procedure: GASTROC RECESSION EXTREMITY;  Surgeon: Tobie Franky SQUIBB, DPM;  Location: [facility];  Service: Podiatry;  Laterality: Left;  Block   INSERTION OF DIALYSIS CATHETER Left 04/20/2023   Procedure: INSERTION OFTUNNELED  DIALYSIS CATHETER;  Surgeon: Lanis Fonda BRAVO, MD;  Location: [facility] OR;  Service: Vascular;  Laterality: Left;   IR PATIENT EVAL TECH 0-60 MINS  04/17/2024   IR THROMBECTOMY AV FISTULA W/THROMBOLYSIS/PTA INC/SHUNT/IMG RIGHT Right 04/16/2024   IR TUNNELED CENTRAL VENOUS CATH PLC W IMG  04/17/2024   IR US  GUIDE VASC ACCESS RIGHT  04/16/2024   LIGATION OF ARTERIOVENOUS  FISTULA Right 11/17/2023   Procedure: LIGATION OF ARTERIOVENOUS  FISTULA;  Surgeon: Lanis Fonda BRAVO, MD;  Location: [facility] OR;  Service: Vascular;  Laterality: Right;   PERIPHERAL VASCULAR BALLOON ANGIOPLASTY Right 08/16/2023   Procedure: PERIPHERAL VASCULAR BALLOON ANGIOPLASTY;  Surgeon: Melia Lynwood ORN, MD;  Location: [facility];  Service: Cardiovascular;  Laterality: Right;  innominate   REVISON OF ARTERIOVENOUS FISTULA Right 11/17/2023   Procedure: REVISON OF ARTERIOVENOUS FISTULA;  Surgeon: Lanis Fonda BRAVO, MD;  Location: [facility] OR;  Service: Vascular;  Laterality: Right;   TOE SURGERY Left  01/2023   TUBAL LIGATION      Allergies  Allergen Reactions   Bee Pollen Other (See Comments)    Seasonal Allergies   Dust Mite Extract Cough    Sneezing & Cough   Mixed Ragweed Itching    Prior to Admission medications   Medication Sig Start Date End Date Taking? Authorizing Provider  albuterol  (PROVENTIL ) (2.5 MG/3ML) 0.083% nebulizer solution Take 2.5 mg by nebulization every 6 (six) hours as needed for wheezing or shortness of breath. 08/10/18   [provider]  albuterol  (VENTOLIN  HFA) 108 (90 Base) MCG/ACT inhaler Inhale 1-2 puffs into the lungs every 6 (six)  hours as needed for wheezing or shortness of breath (Asthma).    [provider]  apixaban  (ELIQUIS ) 5 MG TABS tablet Take 1 tablet (5 mg total) by mouth 2 (two) times daily. 03/26/24   Patwardhan, Newman PARAS, MD  Blood Pressure Monitor DEVI 1 Device by Does not apply route daily. 07/17/23   Patwardhan, Newman PARAS, MD  Continuous Blood Gluc Receiver (DEXCOM G7 RECEIVER) DEVI Use to monitor BG continuously Patient not taking: Reported on 04/25/2024 04/13/22   Nida, Gebreselassie W, MD  Continuous Glucose Sensor (DEXCOM G7 SENSOR) MISC CHANGE SENSOR EVERY 10 DAYS Patient not taking: Reported on 04/25/2024 12/01/23   Nida, Gebreselassie W, MD  diltiazem  (CARDIZEM ) 30 MG tablet Take 1 tablet (30 mg total) by mouth 3 (three) times daily as needed. 03/26/24   Patwardhan, Newman PARAS, MD  fenofibrate  (TRICOR ) 145 MG tablet Take 145 mg by mouth daily. 08/23/23   [provider]  FLONASE ALLERGY RELIEF 50 MCG/ACT nasal spray Place 1 spray into both nostrils daily.    [provider]  furosemide  (LASIX ) 20 MG tablet Take 20 mg by mouth daily.    [provider]  insulin  aspart protamine - aspart (NOVOLOG  70/30 MIX) (70-30) 100 UNIT/ML FlexPen Inject 10 Units into the skin 2 (two) times daily with a meal. 04/22/23   Fairy Frames, MD  insulin  glargine (LANTUS ) 100 UNIT/ML injection Inject 10 Units into the skin at bedtime.    [provider]  Insulin  Pen Needle 32G X 4 MM MISC Use to inject insulin  twice daily 01/23/23   Nida, Gebreselassie W, MD  isosorbide -hydrALAZINE  (BIDIL ) 20-37.5 MG tablet Take 1 tablet by mouth 3 (three) times daily. 04/23/22   Gonfa, Taye T, MD  labetalol  (NORMODYNE ) 300 MG tablet Take 1 tablet (300 mg total) by mouth 2 (two) times daily. 03/26/24   Patwardhan, Newman PARAS, MD  LOVAZA 1 g capsule Take 2 g by mouth 2 (two) times daily.    [provider]  rosuvastatin  (CRESTOR ) 40 MG tablet Take 40 mg by mouth in the morning.    [provider]     Social History   Socioeconomic History   Marital status: Single    Spouse name: Not on file   Number of children: 4   Years of education: Not on file   Highest education level: Not on file  Occupational History   Occupation: unemployed  Tobacco Use   Smoking status: Former    Current packs/day: 0.00    Average packs/day: 0.5 packs/day for 30.0 years (15.0 ttl pk-yrs)    Types: Cigarettes    Start date: 12    Quit date: 2017    Years since quitting: 8.7    Passive exposure: Past   Smokeless tobacco: Never  Vaping Use   Vaping status: Never Used  Substance and Sexual Activity  Alcohol use: Not Currently    Alcohol/week: 0.0 standard drinks of alcohol    Comment: rare   Drug use: Not Currently    Types: Marijuana    Comment: occasional   Sexual activity: Not Currently    Partners: Male    Birth control/protection: Surgical  Other Topics Concern   Not on file  Social History Narrative   Lives with 3 kids   Right handed   Drinks 2 cups caffeine daily   Dialysis T-Th-Sat [facility]   Social Drivers of Health   Financial Resource Strain: Not on file  Food Insecurity: Patient Declined (04/17/2024)   Hunger Vital Sign    Worried About Running Out of Food in the Last Year: Patient declined    Ran Out of Food in the Last Year: Patient declined  Transportation Needs: Patient Declined (04/17/2024)   PRAPARE - Administrator, Civil Service (Medical): Patient declined    Lack of Transportation (Non-Medical): Patient declined  Physical Activity: Inactive (02/15/2021)   Exercise Vital Sign    Days of Exercise per Week: 0 days    Minutes of Exercise per Session: 0 min  Stress: Stress Concern Present (02/15/2021)   [employer] of Occupational Health - Occupational Stress Questionnaire    Feeling of Stress : Very much  Social Connections: Moderately Isolated (02/15/2021)   Social Connection and Isolation Panel    Frequency of Communication with  Friends and Family: Three times a week    Frequency of Social Gatherings with Friends and Family: Three times a week    Attends Religious Services: More than 4 times per year    Active Member of Clubs or Organizations: No    Attends Banker Meetings: Never    Marital Status: Never married  Intimate Partner Violence: Patient Declined (04/17/2024)   Humiliation, Afraid, Rape, and Kick questionnaire    Fear of Current or Ex-Partner: Patient declined    Emotionally Abused: Patient declined    Physically Abused: Patient declined    Sexually Abused: Patient declined     Family History  Problem Relation Age of Onset   Asthma Mother    Diabetes Father    Hypertension Father    Kidney disease Father    Heart attack Father    Heart failure Father    Hypertension Sister    Asthma Sister    Congestive Heart Failure Maternal Aunt    Diabetes Maternal Grandmother    Congestive Heart Failure Maternal Grandmother    Lung disease Maternal Grandmother    Asthma Other    Hyperlipidemia Other    Hypertension Other    Cancer Other    BRCA 1/2 Neg Hx    Breast cancer Neg Hx     ROS: As above  Physical Examination  Vitals:   05/02/24 0717  BP: (!) 166/84  Pulse: 83  Resp: 16  SpO2: 97%   There is no height or weight on file to calculate BMI.  Awake alert and oriented Nonlabored respirations Right IJ tunnel catheter in place without evidence of infection Right upper arm access without thrill or pulsatility consistent with thrombosis  CBC    Component Value Date/Time   WBC 6.0 04/18/2024 0342   RBC 2.74 (L) 04/18/2024 0342   HGB 9.1 (L) 04/18/2024 0342   HGB 8.4 (L) 03/30/2020 0900   HCT 27.0 (L) 04/18/2024 0342   HCT 26.5 (L) 03/30/2020 0900   PLT 227 04/18/2024 0342   PLT 300 03/30/2020  0900   MCV 98.5 04/18/2024 0342   MCV 87 03/30/2020 0900   MCH 33.2 04/18/2024 0342   MCHC 33.7 04/18/2024 0342   RDW 18.3 (H) 04/18/2024 0342   RDW 14.3 03/30/2020 0900    LYMPHSABS 1.4 10/16/2023 1838   MONOABS 0.4 10/16/2023 1838   EOSABS 0.3 10/16/2023 1838   BASOSABS 0.0 10/16/2023 1838    BMET    Component Value Date/Time   NA 132 (L) 04/18/2024 0342   NA 132 (L) 06/29/2022 0917   K 3.1 (L) 04/18/2024 0342   CL 94 (L) 04/18/2024 0342   CO2 24 04/18/2024 0342   GLUCOSE 215 (H) 04/18/2024 0342   BUN 29 (H) 04/18/2024 0342   BUN 46 (H) 06/29/2022 0917   CREATININE 5.11 (H) 04/18/2024 0342   CALCIUM  8.3 (L) 04/18/2024 0342   CALCIUM  7.2 (L) 03/03/2023 0840   GFRNONAA 10 (L) 04/18/2024 0342   GFRAA 51 (L) 03/30/2020 1059    COAGS: Lab Results  Component Value Date   INR 1.0 02/11/2019     ASSESSMENT/PLAN: This is a 53 y.o. female with difficulty dialyzing via tunnel dialysis catheter which is positional.  Plan will be for exchange possibly replacing a new catheter today.  We discussed risk benefits alternatives and consent was signed+  Sherry Rogus C. Sheree, MD [facility] Office: [phone] Pager: [phone]

## 2024-05-05 ENCOUNTER — Ambulatory Visit: Payer: Self-pay | Admitting: Cardiology

## 2024-05-05 DIAGNOSIS — I517 Cardiomegaly: Secondary | ICD-10-CM

## 2024-05-05 DIAGNOSIS — R9431 Abnormal electrocardiogram [ECG] [EKG]: Secondary | ICD-10-CM

## 2024-05-05 NOTE — Progress Notes (Signed)
 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 Patient is on dialysis, unsure if MRI can be done. Can get a PYP scan for amyloidosis and refer to heart failure specialist.  Thanks MJP

## 2024-05-06 MED FILL — Heparin Sodium (Porcine) Inj 1000 Unit/ML: INTRAMUSCULAR | Qty: 10 | Status: AC

## 2024-05-06 NOTE — Progress Notes (Signed)
 Unable to leave message will try again at later time.

## 2024-05-08 ENCOUNTER — Emergency Department (HOSPITAL_COMMUNITY)

## 2024-05-08 ENCOUNTER — Emergency Department (HOSPITAL_BASED_OUTPATIENT_CLINIC_OR_DEPARTMENT_OTHER)
Admission: EM | Admit: 2024-05-08 | Discharge: 2024-05-09 | Disposition: A | Discharge status: Home / Self Care | Attending: Emergency Medicine | Admitting: Emergency Medicine

## 2024-05-08 ENCOUNTER — Other Ambulatory Visit: Payer: Self-pay

## 2024-05-08 ENCOUNTER — Emergency Department (HOSPITAL_BASED_OUTPATIENT_CLINIC_OR_DEPARTMENT_OTHER)

## 2024-05-08 ENCOUNTER — Encounter (HOSPITAL_BASED_OUTPATIENT_CLINIC_OR_DEPARTMENT_OTHER): Payer: Self-pay | Admitting: Urology

## 2024-05-08 DIAGNOSIS — I10 Essential (primary) hypertension: Secondary | ICD-10-CM | POA: Diagnosis not present

## 2024-05-08 DIAGNOSIS — R413 Other amnesia: Secondary | ICD-10-CM | POA: Diagnosis present

## 2024-05-08 DIAGNOSIS — Z992 Dependence on renal dialysis: Secondary | ICD-10-CM | POA: Insufficient documentation

## 2024-05-08 DIAGNOSIS — R410 Disorientation, unspecified: Secondary | ICD-10-CM | POA: Diagnosis not present

## 2024-05-08 DIAGNOSIS — Z794 Long term (current) use of insulin: Secondary | ICD-10-CM | POA: Diagnosis not present

## 2024-05-08 DIAGNOSIS — Z7901 Long term (current) use of anticoagulants: Secondary | ICD-10-CM | POA: Diagnosis not present

## 2024-05-08 DIAGNOSIS — I639 Cerebral infarction, unspecified: Secondary | ICD-10-CM

## 2024-05-08 DIAGNOSIS — R404 Transient alteration of awareness: Secondary | ICD-10-CM

## 2024-05-08 DIAGNOSIS — E11319 Type 2 diabetes mellitus with unspecified diabetic retinopathy without macular edema: Secondary | ICD-10-CM | POA: Diagnosis not present

## 2024-05-08 DIAGNOSIS — I2510 Atherosclerotic heart disease of native coronary artery without angina pectoris: Secondary | ICD-10-CM | POA: Insufficient documentation

## 2024-05-08 DIAGNOSIS — Z79899 Other long term (current) drug therapy: Secondary | ICD-10-CM | POA: Insufficient documentation

## 2024-05-08 DIAGNOSIS — Z7401 Bed confinement status: Secondary | ICD-10-CM | POA: Diagnosis not present

## 2024-05-08 LAB — CBC
HCT: 33.1 % — ABNORMAL LOW (ref 36.0–46.0)
Hemoglobin: 10.9 g/dL — ABNORMAL LOW (ref 12.0–15.0)
MCH: 31.9 pg (ref 26.0–34.0)
MCHC: 32.9 g/dL (ref 30.0–36.0)
MCV: 96.8 fL (ref 80.0–100.0)
Platelets: 281 K/uL (ref 150–400)
RBC: 3.42 MIL/uL — ABNORMAL LOW (ref 3.87–5.11)
RDW: 17.4 % — ABNORMAL HIGH (ref 11.5–15.5)
WBC: 10.3 K/uL (ref 4.0–10.5)
nRBC: 0.2 % (ref 0.0–0.2)

## 2024-05-08 LAB — COMPREHENSIVE METABOLIC PANEL WITH GFR
ALT: 16 U/L (ref 0–44)
AST: 21 U/L (ref 15–41)
Albumin: 4.5 g/dL (ref 3.5–5.0)
Alkaline Phosphatase: 53 U/L (ref 38–126)
Anion gap: 14 (ref 5–15)
BUN: 15 mg/dL (ref 6–20)
CO2: 30 mmol/L (ref 22–32)
Calcium: 8.5 mg/dL — ABNORMAL LOW (ref 8.9–10.3)
Chloride: 94 mmol/L — ABNORMAL LOW (ref 98–111)
Creatinine, Ser: 2.76 mg/dL — ABNORMAL HIGH (ref 0.44–1.00)
GFR, Estimated: 20 mL/min — ABNORMAL LOW (ref >60)
Glucose, Bld: 138 mg/dL — ABNORMAL HIGH (ref 70–99)
Potassium: 3.1 mmol/L — ABNORMAL LOW (ref 3.5–5.1)
Sodium: 138 mmol/L (ref 135–145)
Total Bilirubin: 0.3 mg/dL (ref 0.0–1.2)
Total Protein: 8.8 g/dL — ABNORMAL HIGH (ref 6.5–8.1)

## 2024-05-08 LAB — DIFFERENTIAL
Abs Immature Granulocytes: 0.13 K/uL — ABNORMAL HIGH (ref 0.00–0.07)
Basophils Absolute: 0.1 K/uL (ref 0.0–0.1)
Basophils Relative: 1 %
Eosinophils Absolute: 0.5 K/uL (ref 0.0–0.5)
Eosinophils Relative: 5 %
Immature Granulocytes: 1 %
Lymphocytes Relative: 13 %
Lymphs Abs: 1.4 K/uL (ref 0.7–4.0)
Monocytes Absolute: 0.9 K/uL (ref 0.1–1.0)
Monocytes Relative: 9 %
Neutro Abs: 7.3 K/uL (ref 1.7–7.7)
Neutrophils Relative %: 71 %

## 2024-05-08 LAB — ETHANOL: Alcohol, Ethyl (B): <15 mg/dL (ref <15)

## 2024-05-08 LAB — PROTIME-INR
INR: 1 (ref 0.8–1.2)
Prothrombin Time: 13.6 s (ref 11.4–15.2)

## 2024-05-08 LAB — CBG MONITORING, ED: Glucose-Capillary: 139 mg/dL — ABNORMAL HIGH (ref 70–99)

## 2024-05-08 LAB — APTT: aPTT: 49 s — ABNORMAL HIGH (ref 24–36)

## 2024-05-08 NOTE — ED Provider Notes (Addendum)
 [facility] Provider Note   CSN: [id number] Arrival date & time: 05/08/24  1650     Patient presents with: Memory Loss   [name] is a 53 y.o. female.   History somewhat limited by patient's memory loss.  Patient was at dialysis today.  Dialysis contacted family member saying that they wanted to transfer her to ED by ambulance for memory loss.  Family member last spoke to her and she was normal somewhere around 1130 this morning.  Not clear what dialysis days are normal for her today.  But assume it is probably Monday Wednesday Friday.  Patient has AV fistula in the right arm.  Also not clear whether dialysis was completed today.  Patient does have Eliquis  listed as a medication.  Patient also has a history of atrial fibrillation.  Hypertension diabetes pseudotumor cerebri hyperlipidemia coronary artery disease diabetic retinopathy atrial fibs flutter.  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  Patient normally tobacco user quit in 2017.  Patient also states she has had stroke before.  Chart review seems to imply that that was in 2020.  Patient had a visit with [facility] neurology in December 2022.  And they talk about the previous stroke.  Patient had MRI brain in January 2021.  With considerations at that time it could include a subacute infarction autoimmune inflammatory or neoplastic etiologies.  They did compare that to MRI in 2020.  But it was right frontal parasagittal anterior corpus callosum lesions slightly hypointense on ADC map.  With heterogeneous enhancement.  Patient here is able to verbalize.  But has significant memory recall issues.  According to [facility] neurology's note from December 2022.  Is that patient was admitted in July 2020 developed sudden onset facial numbness as well as right-sided weakness and gait difficulty.  MRI scan at that time showed a small midbrain and brainstem lacunar infarct.  Patient when she was  seen in follow-up by them in December 2022 there was a concern for her stroke follow-up and there was a history of memory loss at that time.  [name] main symptoms seem to be memory loss.  And there seems to be some speech abnormalities.       Prior to Admission medications   Medication Sig Start Date End Date Taking? Authorizing Provider  albuterol  (PROVENTIL ) (2.5 MG/3ML) 0.083% nebulizer solution Take 2.5 mg by nebulization every 6 (six) hours as needed for wheezing or shortness of breath. 08/10/18   [provider]  albuterol  (VENTOLIN  HFA) 108 (90 Base) MCG/ACT inhaler Inhale 1-2 puffs into the lungs every 6 (six) hours as needed for wheezing or shortness of breath (Asthma).    [provider]  apixaban  (ELIQUIS ) 5 MG TABS tablet Take 1 tablet (5 mg total) by mouth 2 (two) times daily. 03/26/24   Patwardhan, Newman PARAS, MD  Blood Pressure Monitor DEVI 1 Device by Does not apply route daily. 07/17/23   Patwardhan, Newman PARAS, MD  Continuous Blood Gluc Receiver (DEXCOM G7 RECEIVER) DEVI Use to monitor BG continuously Patient not taking: Reported on 04/25/2024 04/13/22   Nida, Gebreselassie W, MD  Continuous Glucose Sensor (DEXCOM G7 SENSOR) MISC CHANGE SENSOR EVERY 10 DAYS Patient not taking: Reported on 04/25/2024 12/01/23   Nida, Gebreselassie W, MD  diltiazem  (CARDIZEM ) 30 MG tablet Take 1 tablet (30 mg total) by mouth 3 (three) times daily as needed. 03/26/24   Patwardhan, Newman PARAS, MD  fenofibrate  (TRICOR ) 145 MG tablet Take  145 mg by mouth daily. 08/23/23   [provider]  FLONASE ALLERGY RELIEF 50 MCG/ACT nasal spray Place 1 spray into both nostrils daily.    [provider]  furosemide  (LASIX ) 20 MG tablet Take 20 mg by mouth daily.    [provider]  insulin  aspart protamine - aspart (NOVOLOG  70/30 MIX) (70-30) 100 UNIT/ML FlexPen Inject 10 Units into the skin 2 (two) times daily with a meal. 04/22/23   Fairy Frames, MD  insulin  glargine (LANTUS )  100 UNIT/ML injection Inject 10 Units into the skin at bedtime.    [provider]  Insulin  Pen Needle 32G X 4 MM MISC Use to inject insulin  twice daily 01/23/23   Nida, Gebreselassie W, MD  isosorbide -hydrALAZINE  (BIDIL ) 20-37.5 MG tablet Take 1 tablet by mouth 3 (three) times daily. 04/23/22   Gonfa, Taye T, MD  labetalol  (NORMODYNE ) 300 MG tablet Take 1 tablet (300 mg total) by mouth 2 (two) times daily. 03/26/24   Patwardhan, Newman PARAS, MD  LOVAZA 1 g capsule Take 2 g by mouth 2 (two) times daily.    [provider]  rosuvastatin  (CRESTOR ) 40 MG tablet Take 40 mg by mouth in the morning.    [provider]    Allergies: Bee pollen, Dust mite extract, and Mixed ragweed    Review of Systems  Unable to perform ROS: Mental status change    Updated Vital Signs BP (!) 195/88   Pulse 95   Temp 98.8 F (37.1 C) (Oral)   Resp 18   Ht 1.753 m (5' 9)   Wt 101.6 kg   LMP 12/21/2021   SpO2 100%   BMI 33.08 kg/m   Physical Exam Vitals and nursing note reviewed.  Constitutional:      General: She is not in acute distress.    Appearance: Normal appearance. She is well-developed.  HENT:     Head: Normocephalic and atraumatic.  Eyes:     Conjunctiva/sclera: Conjunctivae normal.     Pupils: Pupils are equal, round, and reactive to light.  Cardiovascular:     Rate and Rhythm: Normal rate and regular rhythm.     Heart sounds: No murmur heard. Pulmonary:     Effort: Pulmonary effort is normal. No respiratory distress.     Breath sounds: Normal breath sounds.     Comments: Dialysis catheter right anterior chest. Abdominal:     Palpations: Abdomen is soft.     Tenderness: There is no abdominal tenderness.  Musculoskeletal:        General: No swelling.     Cervical back: Neck supple. No rigidity.     Comments: Old AV fistula right upper extremity.  No longer in use  Skin:    General: Skin is warm and dry.     Capillary Refill: Capillary refill takes less than 2  seconds.  Neurological:     Mental Status: She is alert.     Cranial Nerves: No cranial nerve deficit.     Sensory: No sensory deficit.     Motor: No weakness.     Comments: But patient seems to have memory problems.  Is able to state her name.  Is able to get some of the date correct.  Psychiatric:        Mood and Affect: Mood normal.     (all labs ordered are listed, but only abnormal results are displayed) Labs Reviewed  CBG MONITORING, ED - Abnormal; Notable for the following components:  Result Value   Glucose-Capillary 139 (*)    All other components within normal limits  ETHANOL  PROTIME-INR  APTT  CBC  DIFFERENTIAL  COMPREHENSIVE METABOLIC PANEL WITH GFR  URINE DRUG SCREEN    EKG: EKG Interpretation Date/Time:  Wednesday May 08 2024 17:00:13 EDT Ventricular Rate:  94 PR Interval:  194 QRS Duration:  97 QT Interval:  415 QTC Calculation: 519 R Axis:   82  Text Interpretation: Sinus rhythm Atrial premature complex Probable left atrial enlargement Nonspecific T abnormalities, lateral leads Prolonged QT interval No significant change since last tracing Confirmed by Rateel Beldin [phone]) on 05/08/2024 5:06:49 PM  Radiology: No results found.   Procedures   Medications Ordered in the ED - No data to display                                  Medical Decision Making Amount and/or Complexity of Data Reviewed Labs: ordered. Radiology: ordered.   CRITICAL CARE Performed by: Tamsin Nader Total critical care time: 45 minutes Critical care time was exclusive of separately billable procedures and treating other patients. Critical care was necessary to treat or prevent imminent or life-threatening deterioration. Critical care was time spent personally by me on the following activities: development of treatment plan with patient and/or surrogate as well as nursing, discussions with consultants, evaluation of patient's response to treatment, examination  of patient, obtaining history from patient or surrogate, ordering and performing treatments and interventions, ordering and review of laboratory studies, ordering and review of radiographic studies, pulse oximetry and re-evaluation of patient's condition.  Patient best we can tell is on Eliquis .  Patient also just with memory problems the rest of neuroexam seems to be fairly normal.  This would make her not a candidate for TNK.  Also it sounds like the last time known normal for sure was 1130 as per family member.  Will go ahead and do stroke workup.  Patient not a candidate for code stroke at this time.  Patient with improved speech and also some improvement in memory.  Head CT without any acute findings.  Stroke labs alcohol less than 15 INR is normal CBC white count 10.3 hemoglobin 10.9 platelets 281 on complete metabolic panel potassium a little low at 3.1 patient just had dialysis today it looks like she had full dialysis.  GFR 20 she is a chronic dialysis patient.  Anion gap 14 blood sugar was 139.  Based on this patient will need MRI to completely rule out stroke.  Will transfer him to [facility] for MRI.  I think patient can be discharged home if MRI negative.  Symptoms are improving significantly.  Not clear-cut CVA at this time.  Discussed with Dr. Midge at [facility] for ED to ED transfer.  MRI ordered.   Final diagnoses:  Cerebrovascular accident (CVA), unspecified mechanism [facility])    ED Discharge Orders     None          Geraldene Hamilton, MD 05/08/24 1726    Geraldene Hamilton, MD 05/08/24 1729    Geraldene Hamilton, MD 05/08/24 8152    Geraldene Hamilton, MD 05/08/24 1850    Geraldene Hamilton, MD 05/08/24 1857

## 2024-05-08 NOTE — ED Notes (Signed)
 ED Provider at bedside.

## 2024-05-08 NOTE — Discharge Instructions (Signed)
 Your workup here has overall been reassuring.  Please follow-up with neurology.  Return to ER with new or worsening symptoms.

## 2024-05-08 NOTE — ED Notes (Signed)
 Pt in MRI.

## 2024-05-08 NOTE — ED Notes (Signed)
 Bib [facility] from [facility] for MRI

## 2024-05-08 NOTE — ED Provider Notes (Signed)
  Physical Exam  BP (!) 170/93   Pulse 83   Temp 98.6 F (37 C) (Oral)   Resp 16   Ht 5' 9 (1.753 m)   Wt 101.6 kg   LMP 12/21/2021   SpO2 98%   BMI 33.08 kg/m   Physical Exam Constitutional:      Appearance: She is normal weight.  Cardiovascular:     Rate and Rhythm: Normal rate and regular rhythm.  Pulmonary:     Effort: Pulmonary effort is normal.     Breath sounds: Normal breath sounds.  Abdominal:     General: Abdomen is flat. Bowel sounds are normal.     Palpations: Abdomen is soft.  Musculoskeletal:     Right lower leg: No edema.     Left lower leg: No edema.  Neurological:     Mental Status: She is alert.     Comments: Non focal neuro exam.   Psychiatric:        Mood and Affect: Affect is tearful.     Procedures  Procedures  ED Course / MDM    Medical Decision Making Amount and/or Complexity of Data Reviewed Labs: ordered. Radiology: ordered.   Patient was transferred from [facility], seen by Dr. Zackowski please see prior provider's notes for further HPI.  In short patient was sent here for brain MRI due to memory loss to rule out acute stroke. Plan to discharge with neurology follow-up if no acute abnormality.  MR brain no acute intracranial abnormality.  Patient will be given a neurology referral for follow-up.  Hemodynamically stable and well-appearing throughout stay.  Appropriate for discharge     Shermon Fumi Guadron N, PA-C 05/08/24 2310    Midge Golas, MD 05/08/24 2352

## 2024-05-08 NOTE — ED Triage Notes (Signed)
 Dialysis pt M,W,F  Fistula to right arm   Pt was sent from dialysis due to loss of memory  Denies any pain  Pt not remembering certain events of today  Pt alert and oriented to person and place, not oriented to time  Denies weakness or dizziness    Talked to her on phone at 1130 at was normal per family while on way to dialysis, dialyis called at 64   LKW 1130   Provider made aware of pt arrival, CBG wnl at triage

## 2024-05-08 NOTE — ED Notes (Signed)
 Pt given food and drink per EDP

## 2024-05-14 ENCOUNTER — Telehealth: Payer: Self-pay | Admitting: Adult Health

## 2024-05-14 NOTE — Telephone Encounter (Signed)
 Patient said, lost memory for about 1 hour and half. Witness at dialysis said I was acting weird and did not know where I was. Call my emergency contact and he took me to hospital in [facility]. They transferred me to [facility]. Ran CT Scan and could not find anything and discharged me home. Have been having headache since then.

## 2024-05-15 ENCOUNTER — Encounter (HOSPITAL_COMMUNITY): Payer: Self-pay

## 2024-05-15 NOTE — Telephone Encounter (Signed)
 I called pt and started to relay that Dr. Rosemarie did review chart and wanted her to follow up with pcp for evaluation and treatment and then phone call dropped.  I tried to call back and was not able to connect.  (Was not able to relay that if her pcp wanted to refer her back). I called pt back and she had spoken to someone else and got the message.  I wanted to make sure she had gotten the whole message.  She did.  She understood.

## 2024-05-21 ENCOUNTER — Encounter (HOSPITAL_COMMUNITY): Payer: Self-pay | Admitting: Cardiology

## 2024-05-21 ENCOUNTER — Telehealth: Payer: Self-pay

## 2024-05-21 NOTE — Progress Notes (Signed)
   05/21/2024  Patient ID: [name], female   DOB: 05-31-1971, 53 y.o.   MRN: [id number]  Contacted patient regarding referral for diabetes from [place], [place].    The patient has been unable to obtain her Libre 3 Plus sensor from [facility] due to it requiring a prior authorization (PA). The PCP received notification that the PA was approved but the prescription still isn't filled. The patient has been using a Dexcom G7 sensor that she had in the interim, but the readings differ from what she had been getting with her Libre 3 Plus. She wishes to go back to the [place] 3 Plus sensors.  I assisted the patient with a Libre 3 Plus sensor sample today and called [facility] to follow up on the PA. I informed the dispensing pharmacist that we were notified that the PA was approved, they reprocessed the prescription and got a paid claim. I called the patient back to inform her that the [place] 3 Plus will be ready for pick up today.   I also provided the patient with the number for [facility] local office so that she can follow up on her Medicare eligibility.   I will meet with the patient in 2 weeks to follow up on her diabetes. Appointment scheduled for 06/04/24 at 2:00 pm.  Heather Factor, PharmD Clinical Pharmacist  [phone]

## 2024-05-22 ENCOUNTER — Encounter (HOSPITAL_COMMUNITY)

## 2024-05-22 ENCOUNTER — Other Ambulatory Visit (HOSPITAL_COMMUNITY)

## 2024-05-22 ENCOUNTER — Ambulatory Visit: Admitting: Vascular Surgery

## 2024-05-28 NOTE — Progress Notes (Unsigned)
 Office Note     CC:  ESRD Requesting Provider:  Catalina Bare, MD  HPI: [name] is a Right handed 53 y.o. ([birth year]) female with kidney disease who presents at the request of Osei-Bonsu, Bare, MD for permanent HD access. The patient has had bilateral brachiocephalic fistulas, most recently right which required superficialization. Per pt, previous tunneled lines have been placed in the right and left internal jugular veins. Current access is ***. Dialysis days are ***.   On exam, ***  The pt is *** on a statin for cholesterol management.  The pt is *** on a daily aspirin .   Other AC:  *** The pt is *** on medications for hypertension.   The pt is *** diabetic. Tobacco hx:  ***  Past Medical History:  Diagnosis Date   Anemia    Anxiety    Arthritis    Asthma    Atrial fib/flutter, transient (HCC) 01/2024   pt states that she was dx in [place]   Cataract    Mixed form OU   CKD (chronic kidney disease)    Coronary artery disease    Depression    Diabetes mellitus    Diabetic retinopathy (HCC)    NPDR OU   Dyspnea    Hyperlipidemia    Hypertension    Hypertensive retinopathy    OU   Left thyroid  nodule    diagnosed 07/2018   PAC (premature atrial contraction) 02/15/2021   Pseudotumor cerebri    Sleep apnea    Uses a cpap   Stroke [facility])    Vitamin D deficiency     Past Surgical History:  Procedure Laterality Date   A/V FISTULAGRAM N/A 08/16/2023   Procedure: A/V Fistulagram;  Surgeon: Melia Lynwood ORN, MD;  Location: [facility] INVASIVE CV LAB;  Service: Cardiovascular;  Laterality: N/A;   ACHILLES TENDON REPAIR Right    ACHILLES TENDON SURGERY Left 02/19/2021   Procedure: ACHILLES TENDON REPAIR WITH GRAFT;  Surgeon: Tobie Franky SQUIBB, DPM;  Location: [facility];  Service: Podiatry;  Laterality: Left;  BLOCK   AMPUTATION TOE Right 05/28/2021   Procedure: AMPUTATION TOE;  Surgeon: Joya Stabs, MD;  Location: [facility] OR;  Service: Podiatry;  Laterality:  Right;  Surgical team will do block   AV FISTULA PLACEMENT Left 07/29/2022   Procedure: LEFT ARTERIOVENOUS (AV) FISTULA CREATION;  Surgeon: Sheree Penne Bruckner, MD;  Location: [facility] OR;  Service: Vascular;  Laterality: Left;   AV FISTULA PLACEMENT Right 04/04/2023   Procedure: RIGHT ARM BRACHIOCEPHALIC ARTERIOVENOUS (AV) FISTULA CREATION;  Surgeon: Lanis Fonda FORBES, MD;  Location: [facility] OR;  Service: Vascular;  Laterality: Right;  With regional block   GASTROC RECESSION EXTREMITY Left 02/19/2021   Procedure: GASTROC RECESSION EXTREMITY;  Surgeon: Tobie Franky SQUIBB, DPM;  Location: [facility];  Service: Podiatry;  Laterality: Left;  Block   INSERTION OF DIALYSIS CATHETER Left 04/20/2023   Procedure: INSERTION OFTUNNELED  DIALYSIS CATHETER;  Surgeon: Lanis Fonda FORBES, MD;  Location: [facility] OR;  Service: Vascular;  Laterality: Left;   IR PATIENT EVAL TECH 0-60 MINS  04/17/2024   IR THROMBECTOMY AV FISTULA W/THROMBOLYSIS/PTA INC/SHUNT/IMG RIGHT Right 04/16/2024   IR TUNNELED CENTRAL VENOUS CATH PLC W IMG  04/17/2024   IR US  GUIDE VASC ACCESS RIGHT  04/16/2024   LIGATION OF ARTERIOVENOUS  FISTULA Right 11/17/2023   Procedure: LIGATION OF ARTERIOVENOUS  FISTULA;  Surgeon: Lanis Fonda FORBES, MD;  Location: [facility] OR;  Service: Vascular;  Laterality: Right;  PERIPHERAL VASCULAR BALLOON ANGIOPLASTY Right 08/16/2023   Procedure: PERIPHERAL VASCULAR BALLOON ANGIOPLASTY;  Surgeon: Melia Lynwood ORN, MD;  Location: [facility] INVASIVE CV LAB;  Service: Cardiovascular;  Laterality: Right;  innominate   REVISON OF ARTERIOVENOUS FISTULA Right 11/17/2023   Procedure: REVISON OF ARTERIOVENOUS FISTULA;  Surgeon: Lanis Fonda BRAVO, MD;  Location: [facility] OR;  Service: Vascular;  Laterality: Right;   TOE SURGERY Left 01/2023   TUBAL LIGATION     TUNNELLED CATHETER EXCHANGE Right 05/02/2024   Procedure: TUNNELLED CATHETER EXCHANGE;  Surgeon: Sheree Penne Bruckner, MD;  Location: [facility];  Service: Cardiovascular;   Laterality: Right;    Social History   Socioeconomic History   Marital status: Single    Spouse name: Not on file   Number of children: 4   Years of education: Not on file   Highest education level: Not on file  Occupational History   Occupation: unemployed  Tobacco Use   Smoking status: Former    Current packs/day: 0.00    Average packs/day: 0.5 packs/day for 30.0 years (15.0 ttl pk-yrs)    Types: Cigarettes    Start date: 60    Quit date: 2017    Years since quitting: 8.8    Passive exposure: Past   Smokeless tobacco: Never  Vaping Use   Vaping status: Never Used  Substance and Sexual Activity   Alcohol use: Not Currently    Alcohol/week: 0.0 standard drinks of alcohol    Comment: rare   Drug use: Not Currently    Types: Marijuana    Comment: occasional   Sexual activity: Not Currently    Partners: Male    Birth control/protection: Surgical  Other Topics Concern   Not on file  Social History Narrative   Lives with 3 kids   Right handed   Drinks 2 cups caffeine daily   Dialysis [facility] [employer]   Social Drivers of Health   Financial Resource Strain: Not on file  Food Insecurity: Patient Declined (04/17/2024)   Hunger Vital Sign    Worried About Running Out of Food in the Last Year: Patient declined    Ran Out of Food in the Last Year: Patient declined  Transportation Needs: Patient Declined (04/17/2024)   PRAPARE - Administrator, Civil Service (Medical): Patient declined    Lack of Transportation (Non-Medical): Patient declined  Physical Activity: Inactive (02/15/2021)   Exercise Vital Sign    Days of Exercise per Week: 0 days    Minutes of Exercise per Session: 0 min  Stress: Stress Concern Present (02/15/2021)   [employer] of Occupational Health - Occupational Stress Questionnaire    Feeling of Stress : Very much  Social Connections: Moderately Isolated (02/15/2021)   Social Connection and Isolation Panel    Frequency of  Communication with Friends and Family: Three times a week    Frequency of Social Gatherings with Friends and Family: Three times a week    Attends Religious Services: More than 4 times per year    Active Member of Clubs or Organizations: No    Attends Banker Meetings: Never    Marital Status: Never married  Intimate Partner Violence: Patient Declined (04/17/2024)   Humiliation, Afraid, Rape, and Kick questionnaire    Fear of Current or Ex-Partner: Patient declined    Emotionally Abused: Patient declined    Physically Abused: Patient declined    Sexually Abused: Patient declined   *** Family History  Problem Relation Age of Onset  Asthma Mother    Diabetes Father    Hypertension Father    Kidney disease Father    Heart attack Father    Heart failure Father    Hypertension Sister    Asthma Sister    Congestive Heart Failure Maternal Aunt    Diabetes Maternal Grandmother    Congestive Heart Failure Maternal Grandmother    Lung disease Maternal Grandmother    Asthma Other    Hyperlipidemia Other    Hypertension Other    Cancer Other    BRCA 1/2 Neg Hx    Breast cancer Neg Hx     Current Outpatient Medications  Medication Sig Dispense Refill   albuterol  (PROVENTIL ) (2.5 MG/3ML) 0.083% nebulizer solution Take 2.5 mg by nebulization every 6 (six) hours as needed for wheezing or shortness of breath.     albuterol  (VENTOLIN  HFA) 108 (90 Base) MCG/ACT inhaler Inhale 1-2 puffs into the lungs every 6 (six) hours as needed for wheezing or shortness of breath (Asthma).     apixaban  (ELIQUIS ) 5 MG TABS tablet Take 1 tablet (5 mg total) by mouth 2 (two) times daily. 180 tablet 3   Blood Pressure Monitor DEVI 1 Device by Does not apply route daily. 1 each 0   Continuous Blood Gluc Receiver (DEXCOM G7 RECEIVER) DEVI Use to monitor BG continuously (Patient not taking: Reported on 04/25/2024) 1 each 0   Continuous Glucose Sensor (DEXCOM G7 SENSOR) MISC CHANGE SENSOR EVERY 10 DAYS  (Patient not taking: Reported on 04/25/2024) 3 each 0   diltiazem  (CARDIZEM ) 30 MG tablet Take 1 tablet (30 mg total) by mouth 3 (three) times daily as needed. 90 tablet 1   fenofibrate  (TRICOR ) 145 MG tablet Take 145 mg by mouth daily.     FLONASE ALLERGY RELIEF 50 MCG/ACT nasal spray Place 1 spray into both nostrils daily.     furosemide  (LASIX ) 20 MG tablet Take 20 mg by mouth daily.     insulin  aspart protamine - aspart (NOVOLOG  70/30 MIX) (70-30) 100 UNIT/ML FlexPen Inject 10 Units into the skin 2 (two) times daily with a meal.     insulin  glargine (LANTUS ) 100 UNIT/ML injection Inject 10 Units into the skin at bedtime.     Insulin  Pen Needle 32G X 4 MM MISC Use to inject insulin  twice daily 200 each 2   isosorbide -hydrALAZINE  (BIDIL ) 20-37.5 MG tablet Take 1 tablet by mouth 3 (three) times daily. 270 tablet 1   labetalol  (NORMODYNE ) 300 MG tablet Take 1 tablet (300 mg total) by mouth 2 (two) times daily. 180 tablet 3   LOVAZA 1 g capsule Take 2 g by mouth 2 (two) times daily.     rosuvastatin  (CRESTOR ) 40 MG tablet Take 40 mg by mouth in the morning.     No current facility-administered medications for this visit.    Allergies  Allergen Reactions   Bee Pollen Other (See Comments)    Seasonal Allergies   Dust Mite Extract Cough    Sneezing & Cough   Mixed Ragweed Itching     REVIEW OF SYSTEMS:  *** [X]  denotes positive finding, [ ]  denotes negative finding Cardiac  Comments:  Chest pain or chest pressure:    Shortness of breath upon exertion:    Short of breath when lying flat:    Irregular heart rhythm:        Vascular    Pain in calf, thigh, or hip brought on by ambulation:    Pain in feet at night that  wakes you up from your sleep:     Blood clot in your veins:    Leg swelling:         Pulmonary    Oxygen at home:    Productive cough:     Wheezing:         Neurologic    Sudden weakness in arms or legs:     Sudden numbness in arms or legs:     Sudden onset of  difficulty speaking or slurred speech:    Temporary loss of vision in one eye:     Problems with dizziness:         Gastrointestinal    Blood in stool:     Vomited blood:         Genitourinary    Burning when urinating:     Blood in urine:        Psychiatric    Major depression:         Hematologic    Bleeding problems:    Problems with blood clotting too easily:        Skin    Rashes or ulcers:        Constitutional    Fever or chills:      PHYSICAL EXAMINATION:  There were no vitals filed for this visit.  General:  WDWN in NAD; vital signs documented above Gait: Not observed HENT: WNL, normocephalic Pulmonary: normal non-labored breathing , without Rales, rhonchi,  wheezing Cardiac: {Desc; regular/irreg:14544} HR, without  Murmurs {With/Without:20273} carotid bruit*** Abdomen: soft, NT, no masses Skin: {With/Without:20273} rashes Vascular Exam/Pulses:  Right Left  Radial {Exam; arterial pulse strength 0-4:30167} {Exam; arterial pulse strength 0-4:30167}  Ulnar {Exam; arterial pulse strength 0-4:30167} {Exam; arterial pulse strength 0-4:30167}  Femoral {Exam; arterial pulse strength 0-4:30167} {Exam; arterial pulse strength 0-4:30167}  Popliteal {Exam; arterial pulse strength 0-4:30167} {Exam; arterial pulse strength 0-4:30167}  DP {Exam; arterial pulse strength 0-4:30167} {Exam; arterial pulse strength 0-4:30167}  PT {Exam; arterial pulse strength 0-4:30167} {Exam; arterial pulse strength 0-4:30167}   Extremities: {With/Without:20273} ischemic changes, {With/Without:20273} Gangrene , {With/Without:20273} cellulitis; {With/Without:20273} open wounds;  Musculoskeletal: no muscle wasting or atrophy  Neurologic: A&O X 3;  No focal weakness or paresthesias are detected Psychiatric:  The pt has {Desc; normal/abnormal:11317::Normal} affect.   Non-Invasive Vascular Imaging:   ***    ASSESSMENT/PLAN:  CHRISTABEL CAMIRE is a 53 y.o. female who presents with  {KidneyDisease:19197::end stage renal disease,chronic kidney disease stage ***}  Based on vein mapping and examination, ***. I had an extensive discussion with this patient in regards to the nature of access surgery, including risk, benefits, and alternatives.   The patient is aware that the risks of access surgery include but are not limited to: bleeding, infection, steal syndrome, nerve damage, ischemic monomelic neuropathy, failure of access to mature, complications related to venous hypertension, and possible need for additional access procedures in the future. *** I discussed with the patient the nature of the staged access procedure, specifically the need for a second operation to transpose the first stage fistula if it matures adequately.   The patient has *** agreed to proceed with the above procedure which will be scheduled ***.  [name], MD [facility] [phone]

## 2024-05-30 ENCOUNTER — Ambulatory Visit: Admitting: Vascular Surgery

## 2024-05-30 ENCOUNTER — Ambulatory Visit (HOSPITAL_COMMUNITY)
Admission: RE | Admit: 2024-05-30 | Discharge: 2024-05-30 | Disposition: A | Discharge status: Home / Self Care | Source: Ambulatory Visit | Attending: Vascular Surgery | Admitting: Vascular Surgery

## 2024-05-30 ENCOUNTER — Encounter: Payer: Self-pay | Admitting: Vascular Surgery

## 2024-05-30 ENCOUNTER — Ambulatory Visit (HOSPITAL_BASED_OUTPATIENT_CLINIC_OR_DEPARTMENT_OTHER)
Admission: RE | Admit: 2024-05-30 | Discharge: 2024-05-30 | Disposition: A | Discharge status: Home / Self Care | Source: Ambulatory Visit | Attending: Vascular Surgery | Admitting: Vascular Surgery

## 2024-05-30 ENCOUNTER — Other Ambulatory Visit: Payer: Self-pay

## 2024-05-30 VITALS — BP 94/59 | HR 80 | Temp 98.1°F | Resp 18 | Ht 69.0 in | Wt 221.7 lb

## 2024-05-30 DIAGNOSIS — N186 End stage renal disease: Secondary | ICD-10-CM | POA: Insufficient documentation

## 2024-06-04 ENCOUNTER — Telehealth: Payer: Self-pay

## 2024-06-04 NOTE — Progress Notes (Signed)
   06/04/2024  Patient ID: [name], female   DOB: 10-07-1970, 53 y.o.   MRN: [id number]  Contacted patient regarding referral for diabetes from [place], [place] .   Attempted to contact patient for scheduled appointment for medication management. There was no answer. I was unable to leave a HIPAA compliant message for patient to return my call at their convenience.   I will attempt to reach her again next week.   Heather Factor, PharmD Clinical Pharmacist  [phone]

## 2024-06-05 NOTE — Telephone Encounter (Signed)
 Per Dr. Caprice, patient needs to f/u with neurology and cardiology before being evaluated by the kidney team. Patient states she will see her cardiologist on the 4th and will see her neurologist in January. Appt request to reschedule clinic eval and 6 MWT in early February was sent to scheduler.

## 2024-06-06 ENCOUNTER — Telehealth (HOSPITAL_COMMUNITY): Payer: Self-pay | Admitting: *Deleted

## 2024-06-06 NOTE — Telephone Encounter (Signed)
 Called to give amyloid instructions. VM not set up.

## 2024-06-10 ENCOUNTER — Telehealth: Payer: Self-pay

## 2024-06-10 ENCOUNTER — Other Ambulatory Visit: Payer: Self-pay | Admitting: Cardiology

## 2024-06-10 DIAGNOSIS — R9431 Abnormal electrocardiogram [ECG] [EKG]: Secondary | ICD-10-CM

## 2024-06-10 DIAGNOSIS — I517 Cardiomegaly: Secondary | ICD-10-CM

## 2024-06-10 NOTE — Progress Notes (Signed)
   06/10/2024  Patient ID: [name], female   DOB: 1970/09/28, 53 y.o.   MRN: [id number]  Contacted patient regarding referral for diabetes from [place], [place]  Appointment scheduled for 06/11/2024 at 2:00pm.  Heather Factor, PharmD Clinical Pharmacist  [phone]

## 2024-06-11 ENCOUNTER — Ambulatory Visit (HOSPITAL_COMMUNITY)
Admission: RE | Admit: 2024-06-11 | Discharge: 2024-06-11 | Disposition: A | Discharge status: Home / Self Care | Source: Ambulatory Visit | Attending: Cardiology | Admitting: Cardiology

## 2024-06-11 DIAGNOSIS — I517 Cardiomegaly: Secondary | ICD-10-CM | POA: Insufficient documentation

## 2024-06-11 DIAGNOSIS — R9431 Abnormal electrocardiogram [ECG] [EKG]: Secondary | ICD-10-CM | POA: Insufficient documentation

## 2024-06-11 LAB — MYOCARDIAL AMYLOID PLANAR & SPECT: H/CL Ratio: 1.1

## 2024-06-11 MED ORDER — TECHNETIUM TC 99M PYROPHOSPHATE
22.0000 | Freq: Once | INTRAVENOUS | Status: AC
  Administered 2024-06-11: 22 via INTRAVENOUS

## 2024-06-11 NOTE — Progress Notes (Signed)
   06/11/2024  Patient ID: [name], female   DOB: Apr 04, 1971, 53 y.o.   MRN: [id number]  Contacted patient regarding referral for diabetes from [place], [place]  Appointment scheduled for 06/11/2024 at 2:00pm.  Patient still at hospital for cardiac imaging. Appointment re-scheduled for 06/12/2024 at 9:00am.  Heather Factor, PharmD Clinical Pharmacist  [phone]

## 2024-06-12 NOTE — Progress Notes (Signed)
   06/12/2024  Patient ID: [name], female   DOB: 1970-12-24, 53 y.o.   MRN: [id number]  Contacted patient regarding referral for diabetes from [place], [place]  Attempted to contact patient for scheduled appointment for medication management. Left HIPAA compliant message for patient to return my call at their convenience.   Heather Factor, PharmD Clinical Pharmacist  [phone]

## 2024-06-17 ENCOUNTER — Encounter (HOSPITAL_COMMUNITY): Payer: Self-pay | Admitting: Vascular Surgery

## 2024-06-17 ENCOUNTER — Other Ambulatory Visit: Payer: Self-pay

## 2024-06-17 NOTE — Progress Notes (Signed)
 Anesthesia Chart Review: Same day workup  53 year old female pertinent history including HTN, HLD, obesity BMI 32, PVD, OSA on CPAP, pseudotumor cerebri, asthma, IDDM 2 (A1c 7.9 on 04/15/24), CVA 2020, A-fib on Eliquis , ESRD on HD.  Patient has had bilateral brachiocephalic fistulas both of which are now occluded.  Currently dialyzing through a right IJ [facility] Monday Wednesday Friday.  Recent echocardiogram 04/30/2024 showed LVEF 70 to 75%, severe LVH, grade 1 DD, mid cavitary peak gradient 44 mmHg at rest, 54 mmHg with Valsalva, normal RV systolic function, small pericardial effusion, no significant valvular abnormalities.  Dr. Elmira recommended amyloid study which was done 06/11/2024 and was negative for cardiac ATTR amyloidosis.  Patient reports last dose of Eliquis  06/14/2024.  Patient will need day of surgery labs and evaluation.  EKG 05/08/2024: Sinus rhythm.  Rate 94. Atrial premature complex. Probable left atrial enlargement. Nonspecific T abnormalities, lateral leads. Prolonged QT interval (QTc 519). No significant change since last tracing  Amyloid study with PYP 06/11/2024:   Findings are not suggestive of cardiac ATTR amyloidosis. The myocardium was negative for radiotracer uptake.   The visual grade of myocardial uptake relative to the ribs was Grade 0 (No myocardial uptake and normal bone uptake).   CT images were obtained for attenuation correction and were examined for the presence of coronary calcium  when appropriate.   Coronary calcium  was present on the attenuation correction CT images. Mild coronary calcifications were present. Coronary calcifications were present in the left anterior descending artery, left circumflex artery and right coronary artery distribution(s).   Prior study not available for comparison.  TTE 04/30/2024: 1. Left ventricular ejection fraction, by estimation, is 70 to 75%. The  left ventricle has hyperdynamic function. The left ventricle has no  regional  wall motion abnormalities. There is severe left ventricular  hypertrophy. Left ventricular diastolic  parameters are consistent with Grade I diastolic dysfunction (impaired  relaxation). The average left ventricular global longitudinal strain is  -12.2 %. The global longitudinal strain is abnormal. Mid cavitary peak  gradient measures at rest,   with valsalva   2. Right ventricular systolic function is normal. The right ventricular  size is normal. There is normal pulmonary artery systolic pressure. The  estimated right ventricular systolic pressure is 24.9 mmHg.   3. A small pericardial effusion is present.   4. The mitral valve is normal in structure. Trivial mitral valve  regurgitation.   5. The aortic valve is tricuspid. Aortic valve regurgitation is trivial.  Aortic valve sclerosis/calcification is present, without any evidence of  aortic stenosis.   6. The inferior vena cava is normal in size with greater than 50%  respiratory variability, suggesting right atrial pressure of 3 mmHg.   Conclusion(s)/Recommendation(s): Severe LVH and apical sparing strain  pattern, recommend evaluation for cardiac amyloidosis.      Lynwood Geofm RIGGERS [facility] Short Stay Center/Anesthesiology Phone [phone] 06/17/2024 12:20 PM

## 2024-06-17 NOTE — Anesthesia Preprocedure Evaluation (Signed)
 Anesthesia Evaluation  Patient identified by MRN, date of birth, ID band Patient awake    Reviewed: Allergy & Precautions, NPO status , [name] Chart, lab work & pertinent test results  Airway Mallampati: II  TM Distance: >3 FB Neck ROM: Full    Dental  (+) Missing, Poor Dentition,    Pulmonary asthma , sleep apnea , former smoker   breath sounds clear to auscultation       Cardiovascular hypertension, + CAD and +CHF  + dysrhythmias Atrial Fibrillation  Rhythm:Regular Rate:Normal     Neuro/Psych  Headaches PSYCHIATRIC DISORDERS Anxiety Depression    CVA    GI/Hepatic negative GI ROS, Neg liver ROS,,,  Endo/Other  diabetes    Renal/GU Renal disease     Musculoskeletal  (+) Arthritis ,    Abdominal   Peds  Hematology  (+) Blood dyscrasia, anemia   Anesthesia Other Findings   Reproductive/Obstetrics                              Anesthesia Physical Anesthesia Plan  ASA: 3  Anesthesia Plan: Regional   Post-op Pain Management: Minimal or no pain anticipated   Induction: Intravenous  PONV Risk Score and Plan: 3 and Propofol  infusion, Ondansetron  and Treatment may vary due to age or medical condition  Airway Management Planned: Natural Airway and Nasal Cannula  Additional Equipment: None  Intra-op Plan:   Post-operative Plan:   Informed Consent: I have reviewed the patients History and Physical, chart, labs and discussed the procedure including the risks, benefits and alternatives for the proposed anesthesia with the patient or authorized representative who has indicated his/her understanding and acceptance.       Plan Discussed with: CRNA  Anesthesia Plan Comments: (PAT note by Lynwood Hope, PA-C: 53 year old female pertinent history including HTN, HLD, obesity BMI 32, PVD, OSA on CPAP, pseudotumor cerebri, asthma, IDDM 2 (A1c 7.9 on 04/15/24), CVA 2020, A-fib on Eliquis , ESRD  on HD.  Patient has had bilateral brachiocephalic fistulas both of which are now occluded.  Currently dialyzing through a right IJ [facility] Monday Wednesday Friday.  Recent echocardiogram 04/30/2024 showed LVEF 70 to 75%, severe LVH, grade 1 DD, mid cavitary peak gradient 44 mmHg at rest, 54 mmHg with Valsalva, normal RV systolic function, small pericardial effusion, no significant valvular abnormalities.  Dr. Elmira recommended amyloid study which was done 06/11/2024 and was negative for cardiac ATTR amyloidosis.  Patient reports last dose of Eliquis  06/14/2024.  Patient will need day of surgery labs and evaluation.  EKG 05/08/2024: Sinus rhythm.  Rate 94. Atrial premature complex. Probable left atrial enlargement. Nonspecific T abnormalities, lateral leads. Prolonged QT interval (QTc 519). No significant change since last tracing  Amyloid study with PYP 06/11/2024:   Findings are not suggestive of cardiac ATTR amyloidosis. The myocardium was negative for radiotracer uptake.   The visual grade of myocardial uptake relative to the ribs was Grade 0 (No myocardial uptake and normal bone uptake).   CT images were obtained for attenuation correction and were examined for the presence of coronary calcium  when appropriate.   Coronary calcium  was present on the attenuation correction CT images. Mild coronary calcifications were present. Coronary calcifications were present in the left anterior descending artery, left circumflex artery and right coronary artery distribution(s).   Prior study not available for comparison.  TTE 04/30/2024: 1. Left ventricular ejection fraction, by estimation, is 70 to 75%. The  left ventricle has hyperdynamic  function. The left ventricle has no  regional wall motion abnormalities. There is severe left ventricular  hypertrophy. Left ventricular diastolic  parameters are consistent with Grade I diastolic dysfunction (impaired  relaxation). The average left ventricular  global longitudinal strain is  -12.2 %. The global longitudinal strain is abnormal. Mid cavitary peak  gradient measures at rest,  with valsalva  2. Right ventricular systolic function is normal. The right ventricular  size is normal. There is normal pulmonary artery systolic pressure. The  estimated right ventricular systolic pressure is 24.9 mmHg.  3. A small pericardial effusion is present.  4. The mitral valve is normal in structure. Trivial mitral valve  regurgitation.  5. The aortic valve is tricuspid. Aortic valve regurgitation is trivial.  Aortic valve sclerosis/calcification is present, without any evidence of  aortic stenosis.  6. The inferior vena cava is normal in size with greater than 50%  respiratory variability, suggesting right atrial pressure of 3 mmHg.   Conclusion(s)/Recommendation(s): Severe LVH and apical sparing strain  pattern, recommend evaluation for cardiac amyloidosis.    )         Anesthesia Quick Evaluation

## 2024-06-17 NOTE — Progress Notes (Signed)
 PCP - Rosina Roach, PA Cardiologist - Dr Newman Lawrence Endocrinology - Dr Ethelle Earl Pulmonology - Dr Reggy Salt  Chest x-ray - 08/27/23 EKG - 05/08/24 Stress Test - n/a ECHO - 04/30/24 Cardiac Cath - n/a  ICD Pacemaker/Loop - n/a  Sleep Study -  Yes (07/2017) CPAP - uses CPAP nightly  Diabetes Type 2 THE NIGHT BEFORE SURGERY, take 5 units Lantus  Insulin .      THE MORNING OF SURGERY, do not take Novolog  70/30 Insulin  unless your CBG is greater than 220 mg/dL.   If CBG> 220mg /dL, you may take  of your sliding scale (correction) dose of insulin .  Blood Thinner Instructions:  Hold Eliquis  3 days prior to procedure per MD.  Last dose was on 06/14/24.  Aspirin  Instructions: n/a  NPO  Anesthesia review: Yes  STOP now taking any Aspirin  (unless otherwise instructed by your surgeon), Aleve, Naproxen, Ibuprofen , Motrin , Advil , Goody's, BC's, all herbal medications, fish oil, and all vitamins.   Coronavirus Screening Do you have any of the following symptoms:  Cough yes/no: No Fever (>100.10F)  yes/no: No Runny nose yes/no: No Sore throat yes/no: No Difficulty breathing/shortness of breath  yes/no: No  Have you traveled in the last 14 days and where? yes/no: No [place] 1031/25 - 06/08/24.  Patient verbalized understanding of instructions that were given via phone.

## 2024-06-18 ENCOUNTER — Encounter (HOSPITAL_COMMUNITY): Payer: Self-pay | Admitting: Vascular Surgery

## 2024-06-18 ENCOUNTER — Encounter (HOSPITAL_COMMUNITY): Admission: RE | Disposition: A | Payer: Self-pay | Source: Home / Self Care | Attending: Vascular Surgery

## 2024-06-18 ENCOUNTER — Ambulatory Visit (HOSPITAL_COMMUNITY): Payer: Self-pay | Admitting: Physician Assistant

## 2024-06-18 ENCOUNTER — Other Ambulatory Visit: Payer: Self-pay

## 2024-06-18 ENCOUNTER — Ambulatory Visit (HOSPITAL_COMMUNITY)
Admission: RE | Admit: 2024-06-18 | Discharge: 2024-06-18 | Disposition: A | Discharge status: Home / Self Care | Attending: Vascular Surgery | Admitting: Vascular Surgery

## 2024-06-18 DIAGNOSIS — J45909 Unspecified asthma, uncomplicated: Secondary | ICD-10-CM | POA: Diagnosis not present

## 2024-06-18 DIAGNOSIS — I132 Hypertensive heart and chronic kidney disease with heart failure and with stage 5 chronic kidney disease, or end stage renal disease: Secondary | ICD-10-CM

## 2024-06-18 DIAGNOSIS — I4891 Unspecified atrial fibrillation: Secondary | ICD-10-CM | POA: Diagnosis not present

## 2024-06-18 DIAGNOSIS — G4730 Sleep apnea, unspecified: Secondary | ICD-10-CM | POA: Insufficient documentation

## 2024-06-18 DIAGNOSIS — Z992 Dependence on renal dialysis: Secondary | ICD-10-CM

## 2024-06-18 DIAGNOSIS — I5033 Acute on chronic diastolic (congestive) heart failure: Secondary | ICD-10-CM | POA: Diagnosis not present

## 2024-06-18 DIAGNOSIS — E1122 Type 2 diabetes mellitus with diabetic chronic kidney disease: Secondary | ICD-10-CM | POA: Insufficient documentation

## 2024-06-18 DIAGNOSIS — Z87891 Personal history of nicotine dependence: Secondary | ICD-10-CM

## 2024-06-18 DIAGNOSIS — I2510 Atherosclerotic heart disease of native coronary artery without angina pectoris: Secondary | ICD-10-CM

## 2024-06-18 DIAGNOSIS — N186 End stage renal disease: Secondary | ICD-10-CM | POA: Diagnosis present

## 2024-06-18 DIAGNOSIS — I509 Heart failure, unspecified: Secondary | ICD-10-CM | POA: Diagnosis not present

## 2024-06-18 HISTORY — DX: Headache, unspecified: R51.9

## 2024-06-18 HISTORY — PX: AV FISTULA PLACEMENT: SHX1204

## 2024-06-18 HISTORY — DX: Pneumonia, unspecified organism: J18.9

## 2024-06-18 LAB — POCT I-STAT, CHEM 8
BUN: 45 mg/dL — ABNORMAL HIGH (ref 6–20)
Calcium, Ion: 1.14 mmol/L — ABNORMAL LOW (ref 1.15–1.40)
Chloride: 101 mmol/L (ref 98–111)
Creatinine, Ser: 7.3 mg/dL — ABNORMAL HIGH (ref 0.44–1.00)
Glucose, Bld: 273 mg/dL — ABNORMAL HIGH (ref 70–99)
HCT: 36 % (ref 36.0–46.0)
Hemoglobin: 12.2 g/dL (ref 12.0–15.0)
Potassium: 3.5 mmol/L (ref 3.5–5.1)
Sodium: 137 mmol/L (ref 135–145)
TCO2: 22 mmol/L (ref 22–32)

## 2024-06-18 LAB — GLUCOSE, CAPILLARY
Glucose-Capillary: 261 mg/dL — ABNORMAL HIGH (ref 70–99)
Glucose-Capillary: 230 mg/dL — ABNORMAL HIGH (ref 70–99)

## 2024-06-18 SURGERY — ARTERIOVENOUS (AV) FISTULA CREATION
Anesthesia: Regional | Site: Arm Upper | Laterality: Left

## 2024-06-18 MED ORDER — PROPOFOL 10 MG/ML IV BOLUS
INTRAVENOUS | Status: AC
  Filled 2024-06-18: qty 20

## 2024-06-18 MED ORDER — LIDOCAINE 2% (20 MG/ML) 5 ML SYRINGE
INTRAMUSCULAR | Status: DC | PRN
  Administered 2024-06-18: 50 mg via INTRAVENOUS

## 2024-06-18 MED ORDER — CHLORHEXIDINE GLUCONATE 4 % EX SOLN: 60.0000 mL | Freq: Once | CUTANEOUS | Status: DC

## 2024-06-18 MED ORDER — FENTANYL CITRATE (PF) 100 MCG/2ML IJ SOLN: 25.0000 ug | INTRAMUSCULAR | Status: DC | PRN

## 2024-06-18 MED ORDER — CHLORHEXIDINE GLUCONATE 0.12 % MT SOLN
15.0000 mL | Freq: Once | OROMUCOSAL | Status: AC
  Administered 2024-06-18: 15 mL via OROMUCOSAL
  Filled 2024-06-18: qty 15

## 2024-06-18 MED ORDER — PROPOFOL 10 MG/ML IV BOLUS
INTRAVENOUS | Status: DC | PRN
  Administered 2024-06-18: 20 mg via INTRAVENOUS

## 2024-06-18 MED ORDER — ORAL CARE MOUTH RINSE: 15.0000 mL | Freq: Once | OROMUCOSAL | Status: AC

## 2024-06-18 MED ORDER — GLYCOPYRROLATE PF 0.2 MG/ML IJ SOSY
PREFILLED_SYRINGE | INTRAMUSCULAR | Status: AC
  Filled 2024-06-18: qty 1

## 2024-06-18 MED ORDER — HYDROCODONE-ACETAMINOPHEN 5-325 MG PO TABS: 1.0000 | ORAL_TABLET | Freq: Four times a day (QID) | ORAL | 0 refills | Status: AC | PRN

## 2024-06-18 MED ORDER — LIDOCAINE-EPINEPHRINE (PF) 1.5 %-1:200000 IJ SOLN
INTRAMUSCULAR | Status: DC | PRN
  Administered 2024-06-18: 20 mL via PERINEURAL

## 2024-06-18 MED ORDER — OXYCODONE HCL 5 MG/5ML PO SOLN: 5.0000 mg | Freq: Once | ORAL | Status: DC | PRN

## 2024-06-18 MED ORDER — OXYCODONE HCL 5 MG PO TABS: 5.0000 mg | ORAL_TABLET | Freq: Once | ORAL | Status: DC | PRN

## 2024-06-18 MED ORDER — INSULIN ASPART 100 UNIT/ML IJ SOLN
0.0000 [IU] | INTRAMUSCULAR | Status: DC | PRN
  Administered 2024-06-18: 4 [IU] via SUBCUTANEOUS

## 2024-06-18 MED ORDER — GLYCOPYRROLATE 0.2 MG/ML IJ SOLN
INTRAMUSCULAR | Status: DC | PRN
  Administered 2024-06-18: .1 mg via INTRAVENOUS

## 2024-06-18 MED ORDER — INSULIN ASPART 100 UNIT/ML IJ SOLN
INTRAMUSCULAR | Status: AC
  Filled 2024-06-18: qty 4

## 2024-06-18 MED ORDER — FENTANYL CITRATE (PF) 100 MCG/2ML IJ SOLN
INTRAMUSCULAR | Status: AC
  Filled 2024-06-18: qty 2

## 2024-06-18 MED ORDER — DEXMEDETOMIDINE HCL IN NACL 80 MCG/20ML IV SOLN
INTRAVENOUS | Status: DC | PRN
  Administered 2024-06-18 (×2): 4 ug via INTRAVENOUS

## 2024-06-18 MED ORDER — CEFAZOLIN SODIUM-DEXTROSE 2-4 GM/100ML-% IV SOLN
2.0000 g | INTRAVENOUS | Status: AC
  Administered 2024-06-18: 2 g via INTRAVENOUS
  Filled 2024-06-18: qty 100

## 2024-06-18 MED ORDER — HEPARIN SODIUM (PORCINE) 1000 UNIT/ML IJ SOLN
INTRAMUSCULAR | Status: DC | PRN
  Administered 2024-06-18: 2000 [IU] via INTRAVENOUS

## 2024-06-18 MED ORDER — PROPOFOL 500 MG/50ML IV EMUL
INTRAVENOUS | Status: DC | PRN
  Administered 2024-06-18: 80 ug/kg/min via INTRAVENOUS

## 2024-06-18 MED ORDER — SODIUM CHLORIDE 0.9 % IV SOLN: INTRAVENOUS | Status: DC

## 2024-06-18 MED ORDER — ACETAMINOPHEN 10 MG/ML IV SOLN: 1000.0000 mg | Freq: Once | INTRAVENOUS | Status: DC | PRN

## 2024-06-18 MED ORDER — FENTANYL CITRATE (PF) 100 MCG/2ML IJ SOLN
INTRAMUSCULAR | Status: DC | PRN
  Administered 2024-06-18 (×2): 25 ug via INTRAVENOUS

## 2024-06-18 MED ORDER — PHENYLEPHRINE HCL-NACL 20-0.9 MG/250ML-% IV SOLN
INTRAVENOUS | Status: DC | PRN
  Administered 2024-06-18: 20 ug/min via INTRAVENOUS

## 2024-06-18 MED ORDER — DROPERIDOL 2.5 MG/ML IJ SOLN: 0.6250 mg | Freq: Once | INTRAMUSCULAR | Status: DC | PRN

## 2024-06-18 MED ORDER — 0.9 % SODIUM CHLORIDE (POUR BTL) OPTIME
TOPICAL | Status: DC | PRN
  Administered 2024-06-18: 1000 mL

## 2024-06-18 MED ORDER — HEPARIN 6000 UNIT IRRIGATION SOLUTION
Status: AC
Start: 2024-06-18 — End: 2024-06-18
  Filled 2024-06-18: qty 500

## 2024-06-18 MED ORDER — HEPARIN 6000 UNIT IRRIGATION SOLUTION
Status: DC | PRN
Start: 2024-06-18 — End: 2024-06-18
  Administered 2024-06-18: 1

## 2024-06-18 MED ORDER — MIDAZOLAM HCL 2 MG/2ML IJ SOLN
INTRAMUSCULAR | Status: AC
Start: 2024-06-18 — End: 2024-06-18
  Filled 2024-06-18: qty 2

## 2024-06-18 MED ORDER — MIDAZOLAM HCL (PF) 2 MG/2ML IJ SOLN
INTRAMUSCULAR | Status: DC | PRN
  Administered 2024-06-18: 2 mg via INTRAVENOUS

## 2024-06-18 SURGICAL SUPPLY — 28 items
ARMBAND PINK RESTRICT EXTREMIT (MISCELLANEOUS) ×3 IMPLANT
BAG COUNTER SPONGE SURGICOUNT (BAG) ×3 IMPLANT
BLADE CLIPPER SURG (BLADE) ×3 IMPLANT
BNDG ELASTIC 4X5.8 VLCR STR LF (GAUZE/BANDAGES/DRESSINGS) ×3 IMPLANT
CANISTER SUCTION 3000ML PPV (SUCTIONS) ×3 IMPLANT
CLIP TI MEDIUM 6 (CLIP) ×3 IMPLANT
CLIP TI WIDE RED SMALL 6 (CLIP) ×3 IMPLANT
COVER PROBE W GEL 5X96 (DRAPES) ×3 IMPLANT
DERMABOND ADVANCED .7 DNX12 (GAUZE/BANDAGES/DRESSINGS) ×3 IMPLANT
ELECTRODE REM PT RTRN 9FT ADLT (ELECTROSURGICAL) ×3 IMPLANT
GLOVE BIOGEL PI IND STRL 8 (GLOVE) ×3 IMPLANT
GOWN STRL REUS W/ TWL LRG LVL3 (GOWN DISPOSABLE) ×6 IMPLANT
GOWN STRL REUS W/TWL 2XL LVL3 (GOWN DISPOSABLE) ×6 IMPLANT
KIT BASIN OR (CUSTOM PROCEDURE TRAY) ×3 IMPLANT
KIT TURNOVER KIT B (KITS) ×3 IMPLANT
PACK CV ACCESS (CUSTOM PROCEDURE TRAY) ×3 IMPLANT
PAD ARMBOARD POSITIONER FOAM (MISCELLANEOUS) ×6 IMPLANT
SLING ARM FOAM STRAP LRG (SOFTGOODS) ×1 IMPLANT
SOLN 0.9% NACL POUR BTL 1000ML (IV SOLUTION) ×3 IMPLANT
SOLN STERILE WATER BTL 1000 ML (IV SOLUTION) ×3 IMPLANT
SPIKE FLUID TRANSFER (MISCELLANEOUS) ×3 IMPLANT
SUT MNCRL AB 4-0 PS2 18 (SUTURE) ×3 IMPLANT
SUT PROLENE 6 0 BV (SUTURE) ×3 IMPLANT
SUT PROLENE 7 0 BV 1 (SUTURE) IMPLANT
SUT SILK 2 0 SH (SUTURE) IMPLANT
SUT VIC AB 3-0 SH 27X BRD (SUTURE) ×3 IMPLANT
TOWEL GREEN STERILE (TOWEL DISPOSABLE) ×3 IMPLANT
UNDERPAD 30X36 HEAVY ABSORB (UNDERPADS AND DIAPERS) ×3 IMPLANT

## 2024-06-18 NOTE — Transfer of Care (Signed)
 Immediate Anesthesia Transfer of Care Note  Patient: [name]  Procedure(s) Performed: CREATION OF LEFT ARM BRACHIOBASILIC ARTERIOVENOUS (AV) FISTULA CREATION (Left: Arm Upper)  Patient Location: PACU  Anesthesia Type:MAC combined with regional for post-op pain  Level of Consciousness: awake  Airway & Oxygen Therapy: Patient Spontanous Breathing and Patient connected to face mask oxygen  Post-op Assessment: Report given to RN and Post -op Vital signs reviewed and stable  Post vital signs: Reviewed and stable  Last Vitals:  Vitals Value Taken Time  BP 121/68 06/18/24 09:01  Temp    Pulse 87 06/18/24 09:03  Resp 22 06/18/24 09:03  SpO2 93 % 06/18/24 09:03  Vitals shown include unfiled device data.  Last Pain:  Vitals:   06/18/24 0642  TempSrc:   PainSc: 0-No pain      Patients Stated Pain Goal: 0 (06/18/24 9357)  Complications: No notable events documented.

## 2024-06-18 NOTE — Anesthesia Procedure Notes (Signed)
 Anesthesia Regional Block: Interscalene brachial plexus block   Pre-Anesthetic Checklist: , timeout performed,  Correct Patient, Correct Site, Correct Laterality,  Correct Procedure, Correct Position, site marked,  Risks and benefits discussed,  Surgical consent,  Pre-op  evaluation,  At surgeon's request and post-op pain management  Laterality: Left  Prep: chloraprep       Needles:  Injection technique: Single-shot  Needle Type: Echogenic Stimulator Needle     Needle Length: 9cm  Needle Gauge: 21     Additional Needles:   Procedures:,,,, ultrasound used (permanent image in chart),,    Narrative:  Start time: 06/18/2024 7:25 AM End time: 06/18/2024 7:30 AM Injection made incrementally with aspirations every 5 mL.  Performed by: Personally  Anesthesiologist: Tilford Franky BIRCH, MD  Additional Notes: Discussed risks and benefits of the nerve block in detail, including but not limited vascular injury, permanent nerve damage and infection.   Patient tolerated the procedure well. Local anesthetic introduced in an incremental fashion under minimal resistance after negative aspirations. No paresthesias were elicited. After completion of the procedure, no acute issues were identified and patient continued to be monitored by RN.

## 2024-06-18 NOTE — Anesthesia Postprocedure Evaluation (Signed)
 Anesthesia Post Note  Patient: [name]  Procedure(s) Performed: CREATION OF LEFT ARM BRACHIOBASILIC ARTERIOVENOUS (AV) FISTULA CREATION (Left: Arm Upper)     Patient location during evaluation: PACU Anesthesia Type: Regional Level of consciousness: awake and alert Pain management: pain level controlled Vital Signs Assessment: post-procedure vital signs reviewed and stable Respiratory status: spontaneous breathing, nonlabored ventilation, respiratory function stable and patient connected to nasal cannula oxygen Cardiovascular status: stable and blood pressure returned to baseline Postop Assessment: no apparent nausea or vomiting Anesthetic complications: no   No notable events documented.  Last Vitals:  Vitals:   06/18/24 0930 06/18/24 0945  BP: 130/72 120/69  Pulse: 82 81  Resp: 16 15  Temp:  36.9 C  SpO2: 94% 93%    Last Pain:  Vitals:   06/18/24 0945  TempSrc:   PainSc: 0-No pain                 [name]

## 2024-06-18 NOTE — Inpatient Diabetes Management (Signed)
 Inpatient Diabetes Program Recommendations  AACE/ADA: New Consensus Statement on Inpatient Glycemic Control (2015)  Target Ranges:  Prepandial:   less than 140 mg/dL      Peak postprandial:   less than 180 mg/dL (1-2 hours)      Critically ill patients:  140 - 180 mg/dL   Lab Results  Component Value Date   GLUCAP 261 (H) 06/18/2024   HGBA1C 7.9 (H) 04/15/2024    Review of Glycemic Control  Diabetes history: DM2 Outpatient Diabetes medications: 70/30 10 units TID Current orders for Inpatient glycemic control: Novolog  0-7 Q2H  Inpatient Diabetes Program Recommendations:    Consider adding Lantus  10-12 units Q24H  Novolog  0-6 TID  If eating > 50%, add Novolog  3 units TID  Continue to follow.  Thank you. Shona Brandy, RD, LDN, CDCES Inpatient Diabetes Coordinator [phone]

## 2024-06-18 NOTE — Discharge Instructions (Signed)

## 2024-06-18 NOTE — H&P (Signed)
 Office Note   Patient seen and examined in preop holding.  No complaints. No changes to medication history or physical exam since last seen in clinic. After discussing the risks and benefits of left arm fistula v graft, [name] elected to proceed.   [name] MD   CC:  ESRD Requesting Provider:  No ref. provider found  HPI: [name] is a Right handed 53 y.o. ([birth year]) female with kidney disease who presents at the request of No ref. provider found for permanent HD access. The patient has had bilateral brachiocephalic fistulas, most recently right which required superficialization.  Both are now occluded.  Per pt, previous tunneled lines have been placed in the right and left internal jugular veins. Current access is right IJ tunneled line. Dialysis days are Monday Wednesday Friday.   On exam, [name] was doing well.  She is frustrated that she has had access issues.  She understands that her arterial bifurcation is much higher than normal making fistula creation difficult.   Past Medical History:  Diagnosis Date   Anemia    hx blood transfusion   Anxiety    Arthritis    Asthma    Atrial fib/flutter, transient (HCC) 01/2024   pt states that she was dx in [place]   Cataract    Mixed form OU   CKD (chronic kidney disease)    mon wed fri   Coronary artery disease    Depression    Diabetes mellitus    Diabetic retinopathy (HCC)    NPDR OU   Dyspnea    Headache    Hyperlipidemia    Hypertension    Hypertensive retinopathy    OU   Left thyroid  nodule    diagnosed 07/2018   PAC (premature atrial contraction) 02/15/2021   Pneumonia    yrs ago x 1   Pseudotumor cerebri    Sleep apnea    Uses a cpap   Stroke [facility])    Vitamin D deficiency     Past Surgical History:  Procedure Laterality Date   A/V FISTULAGRAM N/A 08/16/2023   Procedure: A/V Fistulagram;  Surgeon: Melia Lynwood ORN, MD;  [facility]: [facility];  Service: Cardiovascular;  Laterality:  N/A;   ACHILLES TENDON REPAIR Right    ACHILLES TENDON SURGERY Left 02/19/2021   Procedure: ACHILLES TENDON REPAIR WITH GRAFT;  Surgeon: Tobie Franky SQUIBB, DPM;  Location: [facility];  Service: Podiatry;  Laterality: Left;  BLOCK   AMPUTATION TOE Right 05/28/2021   Procedure: AMPUTATION TOE;  Surgeon: Joya Stabs, MD;  Location: [facility] OR;  Service: Podiatry;  Laterality: Right;  Surgical team will do block   AV FISTULA PLACEMENT Left 07/29/2022   Procedure: LEFT ARTERIOVENOUS (AV) FISTULA CREATION;  Surgeon: Sheree Penne Bruckner, MD;  Location: [facility] OR;  Service: Vascular;  Laterality: Left;   AV FISTULA PLACEMENT Right 04/04/2023   Procedure: RIGHT ARM BRACHIOCEPHALIC ARTERIOVENOUS (AV) FISTULA CREATION;  Surgeon: Rim Fonda FORBES, MD;  Location: [facility] OR;  Service: Vascular;  Laterality: Right;  With regional block   GASTROC RECESSION EXTREMITY Left 02/19/2021   Procedure: GASTROC RECESSION EXTREMITY;  Surgeon: Tobie Franky SQUIBB, DPM;  Location: [facility];  Service: Podiatry;  Laterality: Left;  Block   INSERTION OF DIALYSIS CATHETER Left 04/20/2023   Procedure: INSERTION OFTUNNELED  DIALYSIS CATHETER;  Surgeon: Rim Fonda FORBES, MD;  Location: [facility] OR;  Service: Vascular;  Laterality: Left;   IR PATIENT EVAL TECH 0-60 MINS  04/17/2024   IR THROMBECTOMY AV FISTULA W/THROMBOLYSIS/PTA INC/SHUNT/IMG RIGHT Right 04/16/2024   IR TUNNELED CENTRAL VENOUS CATH PLC W IMG  04/17/2024   IR US  GUIDE VASC ACCESS RIGHT  04/16/2024   LIGATION OF ARTERIOVENOUS  FISTULA Right 11/17/2023   Procedure: LIGATION OF ARTERIOVENOUS  FISTULA;  Surgeon: Lanis Fonda BRAVO, MD;  Location: [facility] OR;  Service: Vascular;  Laterality: Right;   PERIPHERAL VASCULAR BALLOON ANGIOPLASTY Right 08/16/2023   Procedure: PERIPHERAL VASCULAR BALLOON ANGIOPLASTY;  Surgeon: Melia Lynwood ORN, MD;  Location: [facility];  Service: Cardiovascular;  Laterality: Right;  innominate   REVISON OF ARTERIOVENOUS FISTULA  Right 11/17/2023   Procedure: REVISON OF ARTERIOVENOUS FISTULA;  Surgeon: Lanis Fonda BRAVO, MD;  Location: [facility] OR;  Service: Vascular;  Laterality: Right;   TOE SURGERY Left 01/2023   TUBAL LIGATION     TUNNELLED CATHETER EXCHANGE Right 05/02/2024   Procedure: TUNNELLED CATHETER EXCHANGE;  Surgeon: Sheree Penne Bruckner, MD;  Location: [facility];  Service: Cardiovascular;  Laterality: Right;    Social History   Socioeconomic History   Marital status: Single    Spouse name: Not on file   Number of children: 4   Years of education: Not on file   Highest education level: Not on file  Occupational History   Occupation: unemployed  Tobacco Use   Smoking status: Former    Current packs/day: 0.00    Average packs/day: 0.5 packs/day for 30.0 years (15.0 ttl pk-yrs)    Types: Cigarettes    Start date: 66    Quit date: 2017    Years since quitting: 8.8    Passive exposure: Past   Smokeless tobacco: Never  Vaping Use   Vaping status: Never Used  Substance and Sexual Activity   Alcohol use: Not Currently    Alcohol/week: 0.0 standard drinks of alcohol    Comment: rare   Drug use: Not Currently    Types: Marijuana    Comment: occasional - has not smoked since 04/2024   Sexual activity: Not Currently    Partners: Male    Birth control/protection: Surgical, Post-menopausal    Comment: tubal ligation  Other Topics Concern   Not on file  Social History Narrative   Lives with 3 kids   Right handed   Drinks 2 cups caffeine daily      Dialysis M-W-F [facility]      Right handed   Social Drivers of Health   Financial Resource Strain: Not on file  Food Insecurity: Patient Declined (04/17/2024)   Hunger Vital Sign    Worried About Running Out of Food in the Last Year: Patient declined    Ran Out of Food in the Last Year: Patient declined  Transportation Needs: Patient Declined (04/17/2024)   PRAPARE - Administrator, Civil Service (Medical): Patient  declined    Lack of Transportation (Non-Medical): Patient declined  Physical Activity: Inactive (02/15/2021)   Exercise Vital Sign    Days of Exercise per Week: 0 days    Minutes of Exercise per Session: 0 min  Stress: Stress Concern Present (02/15/2021)   [employer] of Occupational Health - Occupational Stress Questionnaire    Feeling of Stress : Very much  Social Connections: Moderately Isolated (02/15/2021)   Social Connection and Isolation Panel    Frequency of Communication with Friends and Family: Three times a week    Frequency of Social Gatherings with Friends and Family: Three times a week  Attends Religious Services: More than 4 times per year    Active Member of Clubs or Organizations: No    Attends Banker Meetings: Never    Marital Status: Never married  Intimate Partner Violence: Patient Declined (04/17/2024)   Humiliation, Afraid, Rape, and Kick questionnaire    Fear of Current or Ex-Partner: Patient declined    Emotionally Abused: Patient declined    Physically Abused: Patient declined    Sexually Abused: Patient declined   Family History  Problem Relation Age of Onset   Asthma Mother    Diabetes Father    Hypertension Father    Kidney disease Father    Heart attack Father    Heart failure Father    Hypertension Sister    Asthma Sister    Congestive Heart Failure Maternal Aunt    Diabetes Maternal Grandmother    Congestive Heart Failure Maternal Grandmother    Lung disease Maternal Grandmother    Asthma Other    Hyperlipidemia Other    Hypertension Other    Cancer Other    BRCA 1/2 Neg Hx    Breast cancer Neg Hx     Current Facility-Administered Medications  Medication Dose Route Frequency Provider Last Rate Last Admin   0.9 %  sodium chloride  infusion   Intravenous Continuous Brissia Delisa E, MD       0.9 % irrigation (POUR BTL)    PRN Lanis Fonda BRAVO, MD   1,000 mL at 06/18/24 0729   ceFAZolin  (ANCEF ) IVPB 2g/100 mL premix  2 g  Intravenous 30 min Pre-Op  Kaiyden Simkin E, MD       chlorhexidine  (HIBICLENS ) 4 % liquid 4 Application  60 mL Topical Once Jaythan Hinely E, MD       And   [START ON 06/19/2024] chlorhexidine  (HIBICLENS ) 4 % liquid 4 Application  60 mL Topical Once Dewane Timson E, MD       heparin  6000 units / NS 500 mL irrigation    PRN Adarian Bur E, MD   1 Application at 06/18/24 9268   insulin  aspart (novoLOG ) 100 UNIT/ML injection            insulin  aspart (novoLOG ) injection 0-7 Units  0-7 Units Subcutaneous Q2H PRN Tilford Franky BIRCH, MD   4 Units at 06/18/24 0645    Allergies  Allergen Reactions   Bee Pollen Other (See Comments)    Seasonal Allergies   Dust Mite Extract Cough    Sneezing & Cough   Mixed Ragweed Itching     REVIEW OF SYSTEMS:  [X]  denotes positive finding, [ ]  denotes negative finding Cardiac  Comments:  Chest pain or chest pressure:    Shortness of breath upon exertion:    Short of breath when lying flat:    Irregular heart rhythm:        Vascular    Pain in calf, thigh, or hip brought on by ambulation:    Pain in feet at night that wakes you up from your sleep:     Blood clot in your veins:    Leg swelling:         Pulmonary    Oxygen at home:    Productive cough:     Wheezing:         Neurologic    Sudden weakness in arms or legs:     Sudden numbness in arms or legs:     Sudden onset of difficulty speaking or slurred speech:    Temporary  loss of vision in one eye:     Problems with dizziness:         Gastrointestinal    Blood in stool:     Vomited blood:         Genitourinary    Burning when urinating:     Blood in urine:        Psychiatric    Major depression:         Hematologic    Bleeding problems:    Problems with blood clotting too easily:        Skin    Rashes or ulcers:        Constitutional    Fever or chills:      PHYSICAL EXAMINATION:  Vitals:   06/17/24 1136 06/18/24 0617  BP:  131/67  Pulse:  78  Resp:  18  Temp:   97.9 F (36.6 C)  TempSrc:  Oral  SpO2:  96%  Weight: 99.8 kg 99.8 kg  Height: 5' 9 (1.753 m) 5' 9 (1.753 m)    General:  WDWN in NAD; vital signs documented above Gait: Not observed HENT: WNL, normocephalic Pulmonary: normal non-labored breathing , without Rales, rhonchi,  wheezing Cardiac: regular HR, Abdomen: soft, NT, no masses Skin: with rashes Vascular Exam/Pulses:  Right Left  Radial 2+ (normal) 2+ (normal)  Ulnar    Femoral    Popliteal    DP 2+ (normal) 2+ (normal)  PT     Extremities: without ischemic changes, without Gangrene , without cellulitis; without open wounds;  Musculoskeletal: no muscle wasting or atrophy  Neurologic: A&O X 3;  No focal weakness or paresthesias are detected Psychiatric:  The pt has Normal affect.   Non-Invasive Vascular Imaging:    +-----------------+-------------+----------+----------+  Right Cephalic   Diameter (cm)Depth (cm) Findings   +-----------------+-------------+----------+----------+  Antecubital fossa    0.34               thrombosed  +-----------------+-------------+----------+----------+  Prox forearm         0.25                           +-----------------+-------------+----------+----------+  Mid forearm          0.33                           +-----------------+-------------+----------+----------+  Wrist               0.23                           +-----------------+-------------+----------+----------+   +-----------------+-------------+----------+--------+  Right Basilic    Diameter (cm)Depth (cm)Findings  +-----------------+-------------+----------+--------+  Shoulder            0.44                         +-----------------+-------------+----------+--------+  Prox upper arm       0.26                         +-----------------+-------------+----------+--------+  Mid upper arm        0.38                          +-----------------+-------------+----------+--------+  Dist upper arm       0.33                         +-----------------+-------------+----------+--------+  Antecubital fossa    0.30                         +-----------------+-------------+----------+--------+  Prox forearm         0.29                         +-----------------+-------------+----------+--------+  Mid forearm          0.20                         +-----------------+-------------+----------+--------+  Wrist               0.20                         +-----------------+-------------+----------+--------+   Thrombosed right cephalic vein starting at the proximal upper arm to the  antecubital fossa.  +-----------------+-------------+----------+--------+  Left Cephalic    Diameter (cm)Depth (cm)Findings  +-----------------+-------------+----------+--------+  Shoulder            0.32                         +-----------------+-------------+----------+--------+  Prox upper arm       0.33                         +-----------------+-------------+----------+--------+  Mid upper arm        0.44                         +-----------------+-------------+----------+--------+  Dist upper arm       0.32                         +-----------------+-------------+----------+--------+  Antecubital fossa    0.37                         +-----------------+-------------+----------+--------+  Prox forearm         0.33                         +-----------------+-------------+----------+--------+  Mid forearm          0.25                         +-----------------+-------------+----------+--------+  Wrist               0.24                         +-----------------+-------------+----------+--------+   +-----------------+-------------+----------+--------+  Left Basilic     Diameter (cm)Depth (cm)Findings  +-----------------+-------------+----------+--------+   Shoulder            0.37                         +-----------------+-------------+----------+--------+  Prox upper arm       0.38                         +-----------------+-------------+----------+--------+  Mid upper arm        0.43                         +-----------------+-------------+----------+--------+  Dist upper arm       0.37                         +-----------------+-------------+----------+--------+  Antecubital fossa    0.32                         +-----------------+-------------+----------+--------+  Prox forearm         0.32                         +-----------------+-------------+----------+--------+  Mid forearm          0.23                         +-----------------+-------------+----------+--------+  Wrist               0.16                         +-----------------+-------------+----------+--------+      ASSESSMENT/PLAN:  RYKA BEIGHLEY is a 53 y.o. female who presents with end stage renal disease  Based on vein mapping and examination, she has usable basilic veins bilaterally. We discussed that she is also a candidate for AV graft, however I am concerned that if we use an AV graft, it would need to be a loop graft for sufficient inflow.  Furthermore, at her young age, I am worried that the AV graft will not last long enough leading us  with fewer options in the future I had an extensive discussion with this patient in regards to the nature of access surgery, including risk, benefits, and alternatives.   The patient is aware that the risks of access surgery include but are not limited to: bleeding, infection, steal syndrome, nerve damage, ischemic monomelic neuropathy, failure of access to mature, complications related to venous hypertension, and possible need for additional access procedures in the future. I discussed with the patient the nature of the staged access procedure, specifically the need for a second operation  to transpose the first stage fistula if it matures adequately.   The patient has agreed to proceed with the above procedure which will be scheduled at her convenience.  [name], MD [facility] [phone]

## 2024-06-18 NOTE — Op Note (Signed)
    NAME: [name]    MRN: [id number] DOB: 1971/06/10    DATE OF OPERATION: 06/18/2024  PREOP DIAGNOSIS:    End stage renal disease requiring   POSTOP DIAGNOSIS:    Same  PROCEDURE:    Let arm brachiobasilic fistula creation  SURGEON: [name]  ASSIST: Donnice Sender PA  ANESTHESIA: Block, moderate  EBL: 10ml  INDICATIONS:    [name] is a 53 y.o. female with end stage renal disease in need of long-term HD access.  Patient has previous history of bilateral brachiocephalic fistulas, which unfortunately have occluded.  Access is complicated by high brachial bifurcation.   FINDINGS:   3mm radial artery 3mm basilic vein  TECHNIQUE:   A left upper extremity block was performed by the anesthesia team.   The patient was brought to the operating room and placed in supine position. The left arm was prepped and draped in a standard fashion. IV antibiotics were prior to incision. A timeout was performed.   The basilic vein in the left arm was identified using ultrasound and appeared of sufficient size. A transverse incision was made above the elbow creese in the antecubital fossa. The basilic vein was identified and isolated for 4 cm in length.  The bicipital aponeurosis was partially released and the brachial artery freed from its paired brachial veins and secured with a vessel loop. The patient was heparinized. The basilic vein was marked and ligated distally with 2-0 silk, then flushed with heparinized saline. Vascular clamps were placed proximally and distally on the brachial artery and a 4 mm arteriotomy  was created on the brachial artery. This was flushed with heparin  saline. The vein was juxtaposed to the artery and an anastomosis was created using 6-0 Prolene.   Prior to completing the anastomsis, the vessels were flushed and the suture line was tied down. There was an excellent thrill in the basilic vein from the anastomosis into the upper arm. The  patient had a 2+ radial pulse. The incision was irrigated and hemostasis acheived. The deeper tissue was closed with 3-0 Vicryl and the skin closed with 4-0 Monocryl.    Dermabond was applied the incisions. She was transferred to PACU in stable condition.     [name], MD [facility] DATE OF DICTATION:   06/18/2024

## 2024-06-19 ENCOUNTER — Encounter (HOSPITAL_COMMUNITY): Payer: Self-pay | Admitting: Vascular Surgery

## 2024-06-27 ENCOUNTER — Ambulatory Visit: Attending: Cardiology | Admitting: Cardiology

## 2024-06-27 ENCOUNTER — Encounter: Payer: Self-pay | Admitting: Cardiology

## 2024-06-27 VITALS — BP 136/70 | HR 77 | Ht 66.0 in | Wt 226.0 lb

## 2024-06-27 DIAGNOSIS — I10 Essential (primary) hypertension: Secondary | ICD-10-CM | POA: Diagnosis present

## 2024-06-27 DIAGNOSIS — I480 Paroxysmal atrial fibrillation: Secondary | ICD-10-CM | POA: Diagnosis present

## 2024-06-27 DIAGNOSIS — I517 Cardiomegaly: Secondary | ICD-10-CM | POA: Diagnosis present

## 2024-06-27 DIAGNOSIS — I739 Peripheral vascular disease, unspecified: Secondary | ICD-10-CM | POA: Insufficient documentation

## 2024-06-27 NOTE — Progress Notes (Addendum)
 Cardiology Office Note:  .   Date:  06/27/2024  ID:  [name], DOB 09/27/1970, MRN [id number] PCP: Catalina Bare, MD  [facility] Providers Cardiologist:  Newman Lawrence, MD PCP: Catalina Bare, MD  Chief Complaint  Patient presents with   PAF      History of Present Illness: .    [name] is a 53 y.o. female with hypertension, hyperlipidemia, uncontrolled type 2 diabetes mellitus, ESRD on HD, PAF, tobacco abuse, recurrent strokes, h/o toe amputations  Patient is doing well, only has occasional episodes of palpitations.  Blood pressure used to run low before dialysis, so she was asked by her dialysis providers do not take her blood pressure medications before dialysis.  However, it appears that it has been running higher now.  Blood pressure is fairly well-controlled today.  She is compliant with her medical therapy.  She has not noticed any bleeding issues on Eliquis .   Vitals:   06/27/24 0812  BP: 136/70  Pulse: 77  SpO2: 94%      ROS:  Review of Systems  Cardiovascular:  Negative for chest pain, dyspnea on exertion, leg swelling, palpitations and syncope.     Studies Reviewed: SABRA        EKG 03/26/2024: Sinus rhythm with Premature atrial complexes with Abberant conduction T wave abnormality, consider inferior ischemia Prolonged QT When compared with ECG of 27-Aug-2023 14:14, Abberant conduction is now Present Criteria for Septal infarct are no longer Present T wave inversion now evident in Inferior leads Nonspecific T wave abnormality, worse in Anterolateral leads    PYP scan 06/2024:   Findings are not suggestive of cardiac ATTR amyloidosis. The myocardium  was negative for radiotracer uptake.    The visual grade of myocardial uptake relative to the ribs was Grade 0  (No myocardial uptake and normal bone uptake).    CT images were obtained for attenuation correction and were examined  for the presence of coronary calcium   when appropriate.    Coronary calcium  was present on the attenuation correction CT images.  Mild coronary calcifications were present. Coronary calcifications were  present in the left anterior descending artery, left circumflex artery and  right coronary artery distribution(s).  Media Information    Labs 03/2023-11/2023: HbA1C 7.4% Hb 12.6 Cr 7.1, K 3.5  04/2023: Chol 120, TG 127, HDL 39, LDL 60 HbA1C 7.6%   Physical Exam:   Physical Exam Vitals and nursing note reviewed.  Constitutional:      General: She is not in acute distress. Neck:     Vascular: No JVD.  Cardiovascular:     Rate and Rhythm: Normal rate and regular rhythm.     Heart sounds: Normal heart sounds. No murmur heard. Pulmonary:     Effort: Pulmonary effort is normal.     Breath sounds: Normal breath sounds. No wheezing or rales.  Musculoskeletal:     Right lower leg: No edema.     Left lower leg: No edema.     Risk Assessment/Calculations:    CHA2DS2-VASc Score = 5   This indicates a 7.2% annual risk of stroke. The patient's score is based upon: CHF History: 0 HTN History: 1 Diabetes History: 1 Stroke History: 2 Vascular Disease History: 0 Age Score: 0 Gender Score: 1       VISIT DIAGNOSES:   ICD-10-CM   1. PAF (paroxysmal atrial fibrillation) (HCC)  I48.0     2. Essential hypertension, benign  I10     3.  Left ventricular hypertrophy  I51.7          ASSESSMENT AND PLAN: .    [name] is a 53 y.o. female with hypertension, hyperlipidemia, uncontrolled type 2 diabetes mellitus, ESRD on HD, PAF, tobacco abuse, recurrent strokes.  PAF: No symptom burden. Continue Eliquis  5 mg twice daily.   Continue Lipitor 300 mg twice daily.   Recommend using CPAP for OSA.   Monitor results pending.   Hypertension: Well-controlled.   No changes made today to antihypertensive therapy, but encourage discussing with dialysis providers if she should take antihypertensive medications  before dialysis her blood pressure has been running high.    LVH: PYP scan showed no ATTR. Suspect LVH is related to hypertension.  H/o stroke: Continue rosuvastatin  40 mg daily. Not on aspirin  due to on aggressive Eliquis  for PAF.  Type 2 diabetes mellitus: Continue f/u w/Dr. Lenis. Prior toe amputations, will check ELA duplex w/ABI.     F/u in 6 months At that time, if stable, can see her once a year  Signed, Newman [name], MD

## 2024-06-27 NOTE — Patient Instructions (Signed)
 Follow-Up: At [facility], you and your health needs are our priority.  As part of our continuing mission to provide you with exceptional heart care, our providers are all part of one team.  This team includes your primary Cardiologist (physician) and Advanced Practice Providers or APPs (Physician Assistants and Nurse Practitioners) who all work together to provide you with the care you need, when you need it.  Your next appointment:   6 month(s)  Provider:   Cody Das, MD

## 2024-06-28 ENCOUNTER — Other Ambulatory Visit: Payer: Self-pay

## 2024-06-28 DIAGNOSIS — N186 End stage renal disease: Secondary | ICD-10-CM

## 2024-06-28 NOTE — Addendum Note (Signed)
 Addended by: MANDA BOTTCHER B on: 06/28/2024 05:50 PM   Modules accepted: Orders

## 2024-07-02 ENCOUNTER — Encounter (HOSPITAL_COMMUNITY): Payer: Self-pay

## 2024-07-02 ENCOUNTER — Ambulatory Visit (HOSPITAL_COMMUNITY)
Admission: RE | Admit: 2024-07-02 | Discharge: 2024-07-02 | Disposition: A | Discharge status: Home / Self Care | Source: Ambulatory Visit | Attending: Cardiology | Admitting: Cardiology

## 2024-07-02 ENCOUNTER — Ambulatory Visit (HOSPITAL_COMMUNITY): Admission: RE | Admit: 2024-07-02

## 2024-07-02 DIAGNOSIS — I739 Peripheral vascular disease, unspecified: Secondary | ICD-10-CM | POA: Diagnosis present

## 2024-07-03 ENCOUNTER — Ambulatory Visit: Payer: Self-pay | Admitting: Cardiology

## 2024-07-03 LAB — VAS US ABI WITH/WO TBI
Left ABI: 1.19
Right ABI: 1.24

## 2024-07-25 ENCOUNTER — Encounter: Payer: Self-pay | Admitting: Dietician

## 2024-07-25 ENCOUNTER — Ambulatory Visit: Attending: Vascular Surgery

## 2024-07-25 ENCOUNTER — Ambulatory Visit (HOSPITAL_COMMUNITY): Admission: RE | Admit: 2024-07-25

## 2024-07-25 ENCOUNTER — Encounter: Attending: Physician Assistant | Admitting: Dietician

## 2024-07-25 VITALS — BP 144/83 | HR 90 | Temp 98.3°F | Wt 227.5 lb

## 2024-07-25 DIAGNOSIS — E118 Type 2 diabetes mellitus with unspecified complications: Secondary | ICD-10-CM | POA: Insufficient documentation

## 2024-07-25 DIAGNOSIS — N186 End stage renal disease: Secondary | ICD-10-CM | POA: Insufficient documentation

## 2024-07-25 NOTE — Patient Instructions (Addendum)
 Set an alarm for your Lantus . Remember to  take your meal time insulin .   Keep this at the table.  Get Glucose tabs to put in your purse and car. Keep something like cranberry juice or glucose tabs by your bed to treat a low.  Green [place]. Gringo salsa is low sodium.  Watch portion due to potassium content.

## 2024-07-25 NOTE — Progress Notes (Signed)
 POST OPERATIVE OFFICE NOTE    CC:  F/u for surgery  HPI:  This is a 53 y.o. female who is s/p left first stage brachiobasilic fistula creation with Dr. Lanis on 06/18/2024.  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  She is dialyzing via right IJ [facility] on a Monday Wednesday Friday schedule in [facility].  She denies steal symptoms in the left hand.  She believes her incision of the left arm has healed.  Allergies[1]  Current Outpatient Medications  Medication Sig Dispense Refill   albuterol  (VENTOLIN  HFA) 108 (90 Base) MCG/ACT inhaler Inhale 1-2 puffs into the lungs every 6 (six) hours as needed for wheezing or shortness of breath (Asthma).     apixaban  (ELIQUIS ) 5 MG TABS tablet Take 1 tablet (5 mg total) by mouth 2 (two) times daily. 180 tablet 3   Blood Pressure Monitor DEVI 1 Device by Does not apply route daily. 1 each 0   diltiazem  (CARDIZEM ) 30 MG tablet Take 1 tablet (30 mg total) by mouth 3 (three) times daily as needed. 90 tablet 1   fenofibrate  (TRICOR ) 145 MG tablet Take 145 mg by mouth daily.     FLONASE ALLERGY RELIEF 50 MCG/ACT nasal spray Place 1 spray into both nostrils daily.     furosemide  (LASIX ) 20 MG tablet Take 20 mg by mouth 2 (two) times daily.     HYDROcodone -acetaminophen  (NORCO/VICODIN) 5-325 MG tablet Take 1 tablet by mouth every 6 (six) hours as needed for moderate pain (pain score 4-6). 10 tablet 0   insulin  aspart protamine - aspart (NOVOLOG  70/30 MIX) (70-30) 100 UNIT/ML FlexPen Inject 10 Units into the skin 2 (two) times daily with a meal. (Patient taking differently: Inject 10 Units into the skin 3 (three) times daily with meals.)     insulin  glargine (LANTUS ) 100 UNIT/ML injection Inject 10 Units into the skin at bedtime.     Insulin  Pen Needle 32G X 4 MM MISC Use to inject insulin  twice daily 200 each 2   isosorbide -hydrALAZINE  (BIDIL ) 20-37.5 MG tablet Take 1 tablet by mouth  3 (three) times daily. 270 tablet 1   labetalol  (NORMODYNE ) 300 MG tablet Take 1 tablet (300 mg total) by mouth 2 (two) times daily. 180 tablet 3   LOVAZA 1 g capsule Take 2 g by mouth 2 (two) times daily.     rosuvastatin  (CRESTOR ) 40 MG tablet Take 40 mg by mouth in the morning.     VELPHORO 500 MG chewable tablet Chew 500 mg by mouth 3 (three) times daily with meals.     No current facility-administered medications for this visit.     ROS:  See HPI  Physical Exam:  Vitals:   07/25/24 1405  BP: (!) 144/83  Pulse: 90  Temp: 98.3 F (36.8 C)  TempSrc: Temporal  Weight: 227 lb 8 oz (103.2 kg)    Incision: Incision healed Extremities: Palpable left radial pulse; palpable thrill at the anastomosis Neuro: A&O  Assessment/Plan:  This is a 53 y.o. female who is s/p: Left first stage basilic vein fistula creation  [name] is a 53 year old female who returns to clinic status post left basilic vein fistula creation.  Left hand is well-perfused with a palpable radial pulse.  She has a thrill at the anastomosis.  Duplex demonstrates a patent fistula with adequate flow volume but slow to mature.  We discussed exercising the left hand with a stress ball and using  the left arm for ADLs is much as possible.  We will repeat fistula duplex in 1 month.  If at that time fistula has improved, we will proceed with second stage basilic vein transposition.  She will continue HD via right IJ [name], [name] for now.   Teresa Sender, PA-C [facility] [phone]  Clinic MD:  [name]     [1]  Allergies Allergen Reactions   Bee Pollen Other (See Comments)    Seasonal Allergies   Dust Mite Extract Cough    Sneezing & Cough   Mixed Ragweed Itching

## 2024-07-25 NOTE — Progress Notes (Signed)
 Diabetes Self-Management Education  Visit Type: Follow-up  Appt. Start Time: 58 (late) Appt. End Time: 0845  07/25/2024  [name], identified by name and date of birth, is a 53 y.o. female with a diagnosis of Diabetes:  .   ASSESSMENT Patient is here today alone.  She was last seen by this RD 04/25/2024.   She states that she forgets her Lantus  frequently.  Also, forgets her meal time insulin  or doesn't take if she is too hungry and then takes afterwards. Occasional low blood glucose. Low appetite and frequently skips meals. Walks with a cane and notices no decrease in her strength. Glucose is not well controlled. Patient is on hemodialysis.  Reviewed nutrition guidelines.  Patient took pictures of the low potassium fruit and vegetable choices from the Choose a Meal book.   Referral:  Type 2 Diabetes   History includes:  Type 2 Diabetes, ESRD on HD (2024), hypothyroidism, CVA, OSA - on c-pap, depression/anxiety, anemia, HLD, vitamin D deficiency Medications includes:  lasix , Lovaza, rosuvastatin , Lantus  10 units q HS, Novolog  70/30 before each meals 2-3 times daily(10 units) Labs:  A1C 7.9% 04/15/2024, BUN 45, Creatinine 7.3, Potassium 3.5 on 06/18/2024,  vitamin B-12 >2000 on 07/31/2019 Lipid Panel          Component Value Date/Time    CHOL 194 06/29/2022 0917    TRIG 480 (H) 06/29/2022 0917    HDL 42 06/29/2022 0917    CHOLHDL 4.6 (H) 06/29/2022 0917    CHOLHDL 4.9 02/12/2019 0537    VLDL 77 (H) 02/12/2019 0537    LDLCALC 76 06/29/2022 0917    LABVLDL 76 (H) 06/29/2022 0917  CGM:  Libre - sensor reading 259 now after 2 bananas   CGM Results from download: 04/25/2024 07/25/2024  % Time CGM active:    %   (Goal >70%)   Average glucose:   197 mg/dL for 7 days [id number]  Glucose management indicator:   N/a % 8.5%  Time in range (70-180 mg/dL):   37 %   (Goal >29%) 30  Time High (181-250 mg/dL):   45 %   (Goal < 74%) 40  Time Very High (>250 mg/dL):    18 %   (Goal <  5%) 30  Time Low (54-69 mg/dL):   0 %   (Goal <5%) 0  Time Very Low (<54 mg/dL):   0 %   (Goal <8%) 0  %CV (glucose variability)     %  (Goal <36%)     65 227 lbs 07/25/2024 224 lbs 04/25/2024 222-226 lbs dry weight 243 lbs 02/07/2022   Estimated needs:   2200 calories per day 85-95 grams protein daily   Patient lives with her 2 teen children.  They share shopping and cooking. She is on disability. Fluid restriction:  5 cups day Doesn't like many vegetables (only green beans, corn, and peas) Avoids red meat but eats plant based.  She does eat chicken, shrimp and tuna.    Diabetes Self-Management Education - 07/25/24 1600       Visit Information   Visit Type Follow-up      Psychosocial Assessment   Patient Belief/Attitude about Diabetes Other (comment)    What is the hardest part about your diabetes right now, causing you the most concern, or is the most worrisome to you about your diabetes?   Taking/obtaining medications;Making healty food and beverage choices    Self-care barriers Debilitated state due to current medical condition    Self-management support  Doctor's office    Other persons present Patient    Patient Concerns Nutrition/Meal planning    Special Needs None    Preferred Learning Style No preference indicated    Learning Readiness Ready      Pre-Education Assessment   Patient understands the diabetes disease and treatment process. Needs Review    Patient understands incorporating nutritional management into lifestyle. Needs Review    Patient undertands incorporating physical activity into lifestyle. Needs Review    Patient understands using medications safely. Needs Review    Patient understands monitoring blood glucose, interpreting and using results Needs Review    Patient understands prevention, detection, and treatment of acute complications. Needs Review    Patient understands prevention, detection, and treatment of chronic complications. Needs Review     Patient understands how to develop strategies to address psychosocial issues. Needs Review    Patient understands how to develop strategies to promote health/change behavior. Needs Review      Complications   Fasting Blood glucose range (mg/dL) 819-799;>799    Postprandial Blood glucose range (mg/dL) >799    Number of hypoglycemic episodes per month 0    Number of hyperglycemic episodes ( >200mg /dL): Daily      Dietary Intake   Breakfast 2 bananas    Snack (morning) none    Lunch none frequently    Snack (afternoon) none    Dinner shrimp and pasta    Snack (evening) none    Beverage(s) water, occasional sweet tea      Activity / Exercise   Activity / Exercise Type ADL's      Patient Education   Previous Diabetes Education Yes   04/25/2024   Disease Pathophysiology Definition of diabetes, type 1 and 2, and the diagnosis of diabetes    Healthy Eating Meal options for control of blood glucose level and chronic complications.;Role of diet in the treatment of diabetes and the relationship between the three main macronutrients and blood glucose level;Plate Method    Medications Reviewed patients medication for diabetes, action, purpose, timing of dose and side effects.    Monitoring Taught/evaluated CGM (comment)    Chronic complications Identified and discussed with patient  current chronic complications    Diabetes Stress and Support Identified and addressed patients feelings and concerns about diabetes;Worked with patient to identify barriers to care and solutions      Individualized Goals (developed by patient)   Nutrition General guidelines for healthy choices and portions discussed    Medications take my medication as prescribed    Monitoring  Consistenly use CGM    Problem Solving Eating Pattern;Medication consistency    Reducing Risk examine blood glucose patterns;treat hypoglycemia with 15 grams of carbs if blood glucose less than 70mg /dL;do foot checks daily      Patient  Self-Evaluation of Goals - Patient rates self as meeting previously set goals (% of time)   Nutrition 50 - 75 % (half of the time)    Physical Activity Not Applicable    Medications 50 - 75 % (half of the time)    Monitoring >75% (most of the time)    Problem Solving and behavior change strategies  50 - 75 % (half of the time)    Reducing Risk (treating acute and chronic complications) 50 - 75 % (half of the time)    Health Coping 50 - 75 % (half of the time)      Post-Education Assessment   Patient understands the diabetes disease and treatment process.  Comprehends key points    Patient understands incorporating nutritional management into lifestyle. Needs Review    Patient undertands incorporating physical activity into lifestyle. Comprehends key points    Patient understands using medications safely. Comphrehends key points    Patient understands monitoring blood glucose, interpreting and using results Comprehends key points    Patient understands prevention, detection, and treatment of acute complications. Comprehends key points    Patient understands prevention, detection, and treatment of chronic complications. Comprehends key points    Patient understands how to develop strategies to address psychosocial issues. Needs Review    Patient understands how to develop strategies to promote health/change behavior. Needs Review      Outcomes   Expected Outcomes Demonstrated interest in learning but significant barriers to change    Future DMSE 4-6 wks    Program Status Not Completed      Subsequent Visit   Since your last visit have you continued or begun to take your medications as prescribed? No    Since your last visit have you experienced any weight changes? Gain    Weight Gain (lbs) 3   likely fluid         Individualized Plan for Diabetes Self-Management Training:   Learning Objective:  Patient will have a greater understanding of diabetes self-management. Patient education  plan is to attend individual and/or group sessions per assessed needs and concerns.   Plan:   Patient Instructions  Set an alarm for your Lantus . Remember to  take your meal time insulin .   Keep this at the table.  Get Glucose tabs to put in your purse and car. Keep something like cranberry juice or glucose tabs by your bed to treat a low.  Green [place]. Gringo salsa is low sodium.  Watch portion due to potassium content.  Expected Outcomes:  Demonstrated interest in learning but significant barriers to change  Education material provided: low potassium pictures of fruits and vegetables from the choose a meal book for those on dialysis  If problems or questions, patient to contact team via:  Phone  Future DSME appointment: 4-6 wks

## 2024-07-26 ENCOUNTER — Other Ambulatory Visit: Payer: Self-pay | Admitting: *Deleted

## 2024-07-26 DIAGNOSIS — N186 End stage renal disease: Secondary | ICD-10-CM

## 2024-08-15 ENCOUNTER — Other Ambulatory Visit (HOSPITAL_COMMUNITY): Payer: Self-pay

## 2024-08-15 ENCOUNTER — Encounter: Payer: Self-pay | Admitting: Cardiology

## 2024-08-15 ENCOUNTER — Ambulatory Visit: Attending: Cardiology | Admitting: Cardiology

## 2024-08-15 ENCOUNTER — Ambulatory Visit

## 2024-08-15 VITALS — BP 148/80 | HR 84 | Ht 69.0 in | Wt 229.6 lb

## 2024-08-15 DIAGNOSIS — I1A0 Resistant hypertension: Secondary | ICD-10-CM | POA: Diagnosis present

## 2024-08-15 DIAGNOSIS — E782 Mixed hyperlipidemia: Secondary | ICD-10-CM | POA: Diagnosis present

## 2024-08-15 DIAGNOSIS — I480 Paroxysmal atrial fibrillation: Secondary | ICD-10-CM | POA: Diagnosis present

## 2024-08-15 DIAGNOSIS — R002 Palpitations: Secondary | ICD-10-CM

## 2024-08-15 LAB — LIPID PANEL
Chol/HDL Ratio: 6.1 ratio — ABNORMAL HIGH (ref 0.0–4.4)
Cholesterol, Total: 275 mg/dL — ABNORMAL HIGH (ref 100–199)
HDL: 45 mg/dL
LDL Chol Calc (NIH): 165 mg/dL — ABNORMAL HIGH (ref 0–99)
Triglycerides: 339 mg/dL — ABNORMAL HIGH (ref 0–149)
VLDL Cholesterol Cal: 65 mg/dL — ABNORMAL HIGH (ref 5–40)

## 2024-08-15 MED ORDER — METOPROLOL TARTRATE 50 MG PO TABS
50.0000 mg | ORAL_TABLET | Freq: Two times a day (BID) | ORAL | 3 refills | Status: AC
  Filled 2024-08-15: qty 180, 90d supply, fill #0

## 2024-08-15 NOTE — Patient Instructions (Addendum)
 Medication Instructions:  START Metoprolol   Tartrate 50 mg twice daily   *If you need a refill on your cardiac medications before your next appointment, please call your pharmacy*  Lab Work: Lipid panel   If you have labs (blood work) drawn today and your tests are completely normal, you will receive your results only by: MyChart Message (if you have MyChart) OR A paper copy in the mail If you have any lab test that is abnormal or we need to change your treatment, we will call you to review the results.  Testing/Procedures: Two week zio monitor   Your physician has requested that you wear a Zio heart monitor for __14___ days. This will be mailed to your home with instructions on how to apply the monitor and how to return it when finished. Please allow 2 weeks after returning the heart monitor before our office calls you with the results.   Follow-Up: At [facility], you and your health needs are our priority.  As part of our continuing mission to provide you with exceptional heart care, our providers are all part of one team.  This team includes your primary Cardiologist (physician) and Advanced Practice Providers or APPs (Physician Assistants and Nurse Practitioners) who all work together to provide you with the care you need, when you need it.  Your next appointment:   3 month(s)  Provider:   Newman [name], MD

## 2024-08-15 NOTE — Progress Notes (Unsigned)
Applied a 14 day Zio XT monitor to be mailed to patients home

## 2024-08-15 NOTE — Progress Notes (Signed)
 " Cardiology Office Note:  .   Date:  08/15/2024  ID:  [name], DOB 1970/12/01, MRN [id number] PCP: Catalina Bare, MD  [facility] Providers Cardiologist:  Newman Lawrence, MD PCP: Catalina Bare, MD  Chief Complaint  Patient presents with   Palpitations      History of Present Illness: .    [name] is a 54 y.o. female with hypertension, hyperlipidemia, uncontrolled type 2 diabetes mellitus, ESRD on HD, PAF, tobacco abuse, recurrent strokes, h/o toe amputations  I saw the patient in 06/2024.  Today's appointment was made at the request of her PCP due to suspected side effects of labetalol .  Since then, patient also had increased symptoms of palpitations lasting for anywhere from few seconds to few hours.  Usually happen at night.  She is using CPAP for OSA.  She will monitor in 03/2024, but it does not appear that it was returned appropriately.  Blood pressure is elevated today.  It seems to be elevated before dialysis, but dropped significantly after dialysis, according to the patient.  Vitals:   08/15/24 0852  BP: (!) 148/80  Pulse: 84  SpO2: 99%       ROS:  Review of Systems  Cardiovascular:  Positive for palpitations. Negative for chest pain, dyspnea on exertion, leg swelling and syncope.     Studies Reviewed: SABRA        EKG 03/26/2024: Sinus rhythm with Premature atrial complexes with Abberant conduction T wave abnormality, consider inferior ischemia Prolonged QT When compared with ECG of 27-Aug-2023 14:14, Abberant conduction is now Present Criteria for Septal infarct are no longer Present T wave inversion now evident in Inferior leads Nonspecific T wave abnormality, worse in Anterolateral leads    Lower extremity ABI 06/2024: Right: Resting right ankle-brachial index is within normal range.  Right great toe is partially amputated. Toe cuff is too big for toe unable  to obtain digit pressure.    Left: Resting left  ankle-brachial index is within normal range.  Left great toe is completely amputated.  *See table(s) above for measurements and obse   PYP scan 06/2024:   Findings are not suggestive of cardiac ATTR amyloidosis. The myocardium  was negative for radiotracer uptake.    The visual grade of myocardial uptake relative to the ribs was Grade 0  (No myocardial uptake and normal bone uptake).    CT images were obtained for attenuation correction and were examined  for the presence of coronary calcium  when appropriate.    Coronary calcium  was present on the attenuation correction CT images.  Mild coronary calcifications were present. Coronary calcifications were  present in the left anterior descending artery, left circumflex artery and  right coronary artery distribution(s).  Media Information    Labs 06/2024: Hb 12.2 Cr 7.3  Labs 03/2023-11/2023: HbA1C 7.4% Hb 12.6 Cr 7.1, K 3.5  04/2023: Chol 120, TG 127, HDL 39, LDL 60 HbA1C 7.6%   Physical Exam:   Physical Exam Vitals and nursing note reviewed.  Constitutional:      General: She is not in acute distress. Neck:     Vascular: No JVD.  Cardiovascular:     Rate and Rhythm: Normal rate and regular rhythm.     Heart sounds: Normal heart sounds. No murmur heard. Pulmonary:     Effort: Pulmonary effort is normal.     Breath sounds: Normal breath sounds. No wheezing or rales.  Musculoskeletal:     Right lower leg: No edema.  Left lower leg: No edema.     Risk Assessment/Calculations:    CHA2DS2-VASc Score = 5   This indicates a 7.2% annual risk of stroke. The patient's score is based upon: CHF History: 0 HTN History: 1 Diabetes History: 1 Stroke History: 2 Vascular Disease History: 0 Age Score: 0 Gender Score: 1       VISIT DIAGNOSES:   ICD-10-CM   1. Mixed hyperlipidemia  E78.2 Lipid panel    2. PAF (paroxysmal atrial fibrillation) (HCC)  I48.0     3. Palpitations  R00.2 LONG TERM MONITOR (3-14 DAYS)     4. Resistant hypertension  I1A.0           ASSESSMENT AND PLAN: .    [name] is a 55 y.o. female with hypertension, hyperlipidemia, uncontrolled type 2 diabetes mellitus, ESRD on HD, PAF, tobacco abuse, recurrent strokes, prior toe amputations.  PAF: Recent increase in palpitation symptoms. Recommend 2-week Zio monitor, loss of monitor data from 03/2024.   Continue Eliquis  5 mg twice daily.   Did not tolerate labetalol  due to side effects. Started metoprolol  to tartrate 50 mg twice daily. Continue CPAP for OSA.    Hypertension: Neurovascular hypertension.  Hopefully addition of metoprolol  will help. Continue rest of the baseline antihypertensive therapy.  LVH: PYP scan showed no ATTR. Suspect LVH is related to hypertension.  H/o stroke: Continue rosuvastatin  40 mg daily. Not on aspirin  due to on aggressive Eliquis  for PAF. Check lipid panel today.  Type 2 diabetes mellitus: Continue f/u w/Dr. Lenis.     F/u in 6 months At that time, if stable, can see her once a year  Signed, Newman [name], MD  "

## 2024-08-16 ENCOUNTER — Ambulatory Visit: Payer: Self-pay | Admitting: Cardiology

## 2024-08-16 DIAGNOSIS — E782 Mixed hyperlipidemia: Secondary | ICD-10-CM

## 2024-08-16 NOTE — Progress Notes (Signed)
 LDL was 76 2 years ago, now of 265.  Triglycerides and total cholesterol also elevated.  Please check if patient is taking Crestor  40 mg daily regularly.  If she is, recommend referral to lipid clinic to consider injectable agents.  If she is not, please emphasize compliance and repeat lipid panel in 3 months.  Thanks MJP

## 2024-08-29 ENCOUNTER — Ambulatory Visit (INDEPENDENT_AMBULATORY_CARE_PROVIDER_SITE_OTHER): Admitting: Neurology

## 2024-08-29 ENCOUNTER — Encounter: Payer: Self-pay | Admitting: Neurology

## 2024-08-29 VITALS — BP 146/86 | HR 77 | Ht 69.0 in | Wt 235.2 lb

## 2024-08-29 DIAGNOSIS — R413 Other amnesia: Secondary | ICD-10-CM | POA: Diagnosis not present

## 2024-08-29 DIAGNOSIS — G3184 Mild cognitive impairment, so stated: Secondary | ICD-10-CM

## 2024-08-29 DIAGNOSIS — Z8673 Personal history of transient ischemic attack (TIA), and cerebral infarction without residual deficits: Secondary | ICD-10-CM

## 2024-08-29 NOTE — Patient Instructions (Signed)
 I had a long d/w patient about recent episode of transient confusion and memory loss possibly a TIA, prior lacunar stroke, atrial fibrillation, mild cognitive impairment, risk for recurrent stroke/TIAs, personally independently reviewed imaging studies and stroke evaluation results and answered questions.Continue Eliquis  (apixaban ) daily  for secondary stroke prevention and maintain strict control of hypertension with blood pressure goal below 130/90, diabetes with hemoglobin A1c goal below 6.5% and lipids with LDL cholesterol goal below 70 mg/dL. I also advised the patient to eat a healthy diet with plenty of whole grains, cereals, fruits and vegetables, exercise regularly and maintain ideal body weight .check MR angiogram of the brain and neck, I recommend she do increase participation in cognitively challenging activities like solving crossword puzzles, playing bridge and sudoku.  We also discussed memory compensation strategies.  Followup in the future with my nurse practitioner 6 months or call earlier if necessary.  Memory Compensation Strategies  Use WARM strategy.  W= write it down  A= associate it  R= repeat it  M= make a mental note  2.   You can keep a Glass [employer]lower/designer.  Use a 3-ring notebook with sections for the following: calendar, important names and phone numbers,  medications, doctors' names/phone numbers, lists/reminders, and a section to journal what you did  each day.   3.    Use a calendar to write appointments down.  4.    Write yourself a schedule for the day.  This can be placed on the calendar or in a separate section of the Memory Notebook.  Keeping a  regular schedule can help memory.  5.    Use medication organizer with sections for each day or morning/evening pills.  You may need help loading it  6.    Keep a basket, or pegboard by the door.  Place items that you need to take out with you in the basket or on the pegboard.  You may also want to  include a message  board for reminders.  7.    Use sticky notes.  Place sticky notes with reminders in a place where the task is performed.  For example:  turn off the  stove placed by the stove, lock the door placed on the door at eye level,  take your medications on  the bathroom mirror or by the place where you normally take your medications.  8.    Use alarms/timers.  Use while cooking to remind yourself to check on food or as a reminder to take your medicine, or as a  reminder to make a call, or as a reminder to perform another task, etc.  Stroke Prevention Some medical conditions and behaviors can lead to a higher chance of having a stroke. You can help prevent a stroke by eating healthy, exercising, not smoking, and managing any medical conditions you have. Stroke is a leading cause of functional impairment. Primary prevention is particularly important because a majority of strokes are first-time events. Stroke changes the lives of not only those who experience a stroke but also their family and other caregivers. How can this condition affect me? A stroke is a medical emergency and should be treated right away. A stroke can lead to brain damage and can sometimes be life-threatening. If a person gets medical treatment right away, there is a better chance of surviving and recovering from a stroke. What can increase my risk? The following medical conditions may increase your risk of a stroke: Cardiovascular disease. High blood pressure (hypertension). Diabetes.  High cholesterol. Sickle cell disease. Blood clotting disorders (hypercoagulable state). Obesity. Sleep disorders (obstructive sleep apnea). Other risk factors include: Being older than age 19. Having a history of blood clots, stroke, or mini-stroke (transient ischemic attack, TIA). Genetic factors, such as race, ethnicity, or a family history of stroke. Smoking cigarettes or using other tobacco products. Taking birth control pills,  especially if you also use tobacco. Heavy use of alcohol or drugs, especially cocaine and methamphetamine. Physical inactivity. What actions can I take to prevent this? Manage your health conditions High cholesterol levels. Eating a healthy diet is important for preventing high cholesterol. If cholesterol cannot be managed through diet alone, you may need to take medicines. Take any prescribed medicines to control your cholesterol as told by your health care provider. Hypertension. To reduce your risk of stroke, try to keep your blood pressure below 130/80. Eating a healthy diet and exercising regularly are important for controlling blood pressure. If these steps are not enough to manage your blood pressure, you may need to take medicines. Take any prescribed medicines to control hypertension as told by your health care provider. Ask your health care provider if you should monitor your blood pressure at home. Have your blood pressure checked every year, even if your blood pressure is normal. Blood pressure increases with age and some medical conditions. Diabetes. Eating a healthy diet and exercising regularly are important parts of managing your blood sugar (glucose). If your blood sugar cannot be managed through diet and exercise, you may need to take medicines. Take any prescribed medicines to control your diabetes as told by your health care provider. Get evaluated for obstructive sleep apnea. Talk to your health care provider about getting a sleep evaluation if you snore a lot or have excessive sleepiness. Make sure that any other medical conditions you have, such as atrial fibrillation or atherosclerosis, are managed. Nutrition Follow instructions from your health care provider about what to eat or drink to help manage your health condition. These instructions may include: Reducing your daily calorie intake. Limiting how much salt (sodium) you use to 1,500 milligrams (mg) each day. Using  only healthy fats for cooking, such as olive oil, canola oil, or sunflower oil. Eating healthy foods. You can do this by: Choosing foods that are high in fiber, such as whole grains, and fresh fruits and vegetables. Eating at least 5 servings of fruits and vegetables a day. Try to fill one-half of your plate with fruits and vegetables at each meal. Choosing lean protein foods, such as lean cuts of meat, poultry without skin, fish, tofu, beans, and nuts. Eating low-fat dairy products. Avoiding foods that are high in sodium. This can help lower blood pressure. Avoiding foods that have saturated fat, trans fat, and cholesterol. This can help prevent high cholesterol. Avoiding processed and prepared foods. Counting your daily carbohydrate intake.  Lifestyle If you drink alcohol: Limit how much you have to: 0-1 drink a day for women who are not pregnant. 0-2 drinks a day for men. Know how much alcohol is in your drink. In the [place] one drink equals one 12 oz bottle of beer ( ), one 5 oz glass of wine ( ), or one 1 oz glass of hard liquor (44mL). Do not use any products that contain nicotine or tobacco. These products include cigarettes, chewing tobacco, and vaping devices, such as e-cigarettes. If you need help quitting, ask your health care provider. Avoid secondhand smoke. Do not use drugs. Activity  Try to stay at  a healthy weight. Get at least 30 minutes of exercise on most days, such as: Fast walking. Biking. Swimming. Medicines Take over-the-counter and prescription medicines only as told by your health care provider. Aspirin  or blood thinners (antiplatelets or anticoagulants) may be recommended to reduce your risk of forming blood clots that can lead to stroke. Avoid taking birth control pills. Talk to your health care provider about the risks of taking birth control pills if: You are over 37 years old. You smoke. You get very bad headaches. You have had a blood  clot. Where to find more information American Stroke Association: www.strokeassociation.org Get help right away if: You or a loved one has any symptoms of a stroke. BE FAST is an easy way to remember the main warning signs of a stroke: B - Balance. Signs are dizziness, sudden trouble walking, or loss of balance. E - Eyes. Signs are trouble seeing or a sudden change in vision. F - Face. Signs are sudden weakness or numbness of the face, or the face or eyelid drooping on one side. A - Arms. Signs are weakness or numbness in an arm. This happens suddenly and usually on one side of the body. S - Speech. Signs are sudden trouble speaking, slurred speech, or trouble understanding what people say. T - Time. Time to call emergency services. Write down what time symptoms started. You or a loved one has other signs of a stroke, such as: A sudden, severe headache with no known cause. Nausea or vomiting. Seizure. These symptoms may represent a serious problem that is an emergency. Do not wait to see if the symptoms will go away. Get medical help right away. Call your local emergency services (911 in the [place] Do not drive yourself to the hospital. Summary You can help to prevent a stroke by eating healthy, exercising, not smoking, limiting alcohol intake, and managing any medical conditions you may have. Do not use any products that contain nicotine or tobacco. These include cigarettes, chewing tobacco, and vaping devices, such as e-cigarettes. If you need help quitting, ask your health care provider. Remember BE FAST for warning signs of a stroke. Get help right away if you or a loved one has any of these signs. This information is not intended to replace advice given to you by your health care provider. Make sure you discuss any questions you have with your health care provider. Document Revised: 06/27/2022 Document Reviewed: 06/27/2022 Elsevier Patient Education  2024 [employer].
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
## 2024-09-04 NOTE — Progress Notes (Unsigned)
 "   Postoperative Access Visit   History of Present Illness   [name] is a 54 y.o. year old female who presents for postoperative follow-up for: Left arm brachiobasilic fistula creation on 06/18/24 by Dr. Lanis. 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  She was seen initially post op 1 month ago and her fistula was patent but slow to mature. She has been exercising her arm over the past several weeks. She reports however that at HD they stopped being able to feel a thrill or hear any flow in her fistula 3 weeks ago. She continues to deny any pain, numbness or weakness of the left arm.  She is dialyzing via right IJ [facility] on a Monday Wednesday Friday schedule in [facility].    Physical Examination   Vitals:   09/05/24 0825  BP: 132/74  Pulse: 74  SpO2: 96%  Weight: 234 lb (106.1 kg)  Height: 5' 9 (1.753 m)   Body mass index is 34.56 kg/m.  left arm Incision is well healed, 2+ radial pulse, hand grip is 5/5, sensation in digits is intact, no palpable thrill, no bruit can be auscultated     Non Invasive Vascular lab evaluation   VAS US  Duplex Dialysis Access (AVF, AVG): Findings:  +--------------------+----------+-----------------+--------+  AVF                PSV (cm/s)Flow Vol (mL/min)Comments  +--------------------+----------+-----------------+--------+  Native artery inflow   103                               +--------------------+----------+-----------------+--------+  AVF Anastomosis                                NO flow   +--------------------+----------+-----------------+--------+     +------------+----------+-------------+----------+-------------------------  ----+  OUTFLOW VEINPSV (cm/s)Diameter (cm)Depth (cm)          Describe              +------------+----------+-------------+----------+-------------------------  ----+  Mid UA                                        occluded and mid to  distal ua  +------------+----------+-------------+----------+-------------------------  ----+  Dist UA                   0.26        0.94             occluded              +------------+----------+-------------+----------+-------------------------  ----+   Summary:  No color flow or Doppler signal throuhout the AVF suggestive of occluded left BrachioBasilic AVF.      Medical Decision Making   [name] is a 54 y.o. year old female who presents s/p  Left arm brachiobasilic fistula creation on 06/18/24 by Dr. Lanis.  Duplex today shows occluded left brachiobasilic fistula. 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 She is now indicated for left upper arm graft. She prefers access in her left arm and he right is her dominant hand. Patent is without signs or symptoms of steal syndrome. She is on Eliquis  which will need to be  held. She dialyzes on MWF. Will arrange left AV graft in the next several weeks with Dr. Lanis on a non dialysis day.    Teretha Damme, PA-C [facility] Office: [phone]  Clinic MD: Lanis "

## 2024-09-05 ENCOUNTER — Telehealth: Payer: Self-pay

## 2024-09-05 ENCOUNTER — Encounter: Payer: Self-pay | Admitting: Physician Assistant

## 2024-09-05 ENCOUNTER — Ambulatory Visit: Admitting: Physician Assistant

## 2024-09-05 ENCOUNTER — Ambulatory Visit (HOSPITAL_COMMUNITY)
Admission: RE | Admit: 2024-09-05 | Discharge: 2024-09-05 | Disposition: A | Discharge status: Home / Self Care | Source: Ambulatory Visit | Attending: Vascular Surgery | Admitting: Vascular Surgery

## 2024-09-05 VITALS — BP 132/74 | HR 74 | Ht 69.0 in | Wt 234.0 lb

## 2024-09-05 DIAGNOSIS — N186 End stage renal disease: Secondary | ICD-10-CM | POA: Diagnosis present

## 2024-09-05 NOTE — Telephone Encounter (Signed)
 Attempted to call for surgery scheduling. LVM

## 2024-09-06 ENCOUNTER — Other Ambulatory Visit: Payer: Self-pay

## 2024-09-06 DIAGNOSIS — N186 End stage renal disease: Secondary | ICD-10-CM

## 2024-09-09 ENCOUNTER — Telehealth: Payer: Self-pay | Admitting: Neurology

## 2024-09-10 DIAGNOSIS — R002 Palpitations: Secondary | ICD-10-CM

## 2024-09-17 ENCOUNTER — Other Ambulatory Visit

## 2024-09-24 ENCOUNTER — Ambulatory Visit (HOSPITAL_COMMUNITY): Admit: 2024-09-24 | Admitting: Vascular Surgery

## 2024-10-22 ENCOUNTER — Other Ambulatory Visit

## 2024-11-19 ENCOUNTER — Ambulatory Visit: Admitting: Cardiology

## 2024-11-21 ENCOUNTER — Encounter: Admitting: Dietician

## 2025-03-06 ENCOUNTER — Ambulatory Visit: Admitting: Adult Health
